# Patient Record
Sex: Female | Born: 1940 | Race: White | Hispanic: No | Smoking: Never smoker
Health system: Southern US, Community
[De-identification: ages and names within clinical notes are randomized; demographics above are authoritative.]

## PROBLEM LIST (undated history)

## (undated) ENCOUNTER — Emergency Department (HOSPITAL_BASED_OUTPATIENT_CLINIC_OR_DEPARTMENT_OTHER): Admission: EM | Payer: Self-pay | Source: Home / Self Care

## (undated) DIAGNOSIS — F32A Depression, unspecified: Secondary | ICD-10-CM

## (undated) DIAGNOSIS — H409 Unspecified glaucoma: Secondary | ICD-10-CM

## (undated) DIAGNOSIS — S42402A Unspecified fracture of lower end of left humerus, initial encounter for closed fracture: Secondary | ICD-10-CM

## (undated) DIAGNOSIS — Z5189 Encounter for other specified aftercare: Secondary | ICD-10-CM

## (undated) DIAGNOSIS — G459 Transient cerebral ischemic attack, unspecified: Secondary | ICD-10-CM

## (undated) DIAGNOSIS — R7611 Nonspecific reaction to tuberculin skin test without active tuberculosis: Secondary | ICD-10-CM

## (undated) DIAGNOSIS — M549 Dorsalgia, unspecified: Secondary | ICD-10-CM

## (undated) DIAGNOSIS — G4700 Insomnia, unspecified: Secondary | ICD-10-CM

## (undated) DIAGNOSIS — M5416 Radiculopathy, lumbar region: Secondary | ICD-10-CM

## (undated) DIAGNOSIS — G43909 Migraine, unspecified, not intractable, without status migrainosus: Secondary | ICD-10-CM

## (undated) DIAGNOSIS — G8929 Other chronic pain: Secondary | ICD-10-CM

## (undated) DIAGNOSIS — L0390 Cellulitis, unspecified: Secondary | ICD-10-CM

## (undated) DIAGNOSIS — M1990 Unspecified osteoarthritis, unspecified site: Secondary | ICD-10-CM

## (undated) DIAGNOSIS — G629 Polyneuropathy, unspecified: Secondary | ICD-10-CM

## (undated) DIAGNOSIS — F329 Major depressive disorder, single episode, unspecified: Secondary | ICD-10-CM

## (undated) DIAGNOSIS — I10 Essential (primary) hypertension: Secondary | ICD-10-CM

## (undated) DIAGNOSIS — F419 Anxiety disorder, unspecified: Secondary | ICD-10-CM

## (undated) DIAGNOSIS — G2581 Restless legs syndrome: Secondary | ICD-10-CM

## (undated) DIAGNOSIS — H269 Unspecified cataract: Secondary | ICD-10-CM

## (undated) DIAGNOSIS — E785 Hyperlipidemia, unspecified: Secondary | ICD-10-CM

## (undated) DIAGNOSIS — K219 Gastro-esophageal reflux disease without esophagitis: Secondary | ICD-10-CM

## (undated) DIAGNOSIS — R42 Dizziness and giddiness: Secondary | ICD-10-CM

## (undated) DIAGNOSIS — B019 Varicella without complication: Secondary | ICD-10-CM

## (undated) HISTORY — DX: Polyneuropathy, unspecified: G62.9

## (undated) HISTORY — PX: BACK SURGERY: SHX140

## (undated) HISTORY — DX: Unspecified glaucoma: H40.9

## (undated) HISTORY — DX: Varicella without complication: B01.9

## (undated) HISTORY — PX: APPENDECTOMY: SHX54

## (undated) HISTORY — PX: JOINT REPLACEMENT: SHX530

## (undated) HISTORY — DX: Hyperlipidemia, unspecified: E78.5

## (undated) HISTORY — DX: Encounter for other specified aftercare: Z51.89

## (undated) HISTORY — PX: OTHER SURGICAL HISTORY: SHX169

## (undated) HISTORY — DX: Migraine, unspecified, not intractable, without status migrainosus: G43.909

## (undated) HISTORY — DX: Unspecified fracture of lower end of left humerus, initial encounter for closed fracture: S42.402A

## (undated) HISTORY — PX: SHOULDER ARTHROSCOPY: SHX128

## (undated) HISTORY — PX: ESOPHAGOGASTRODUODENOSCOPY: SHX1529

---

## 1973-06-04 HISTORY — PX: ABDOMINAL HYSTERECTOMY: SHX81

## 1985-06-04 DIAGNOSIS — R7611 Nonspecific reaction to tuberculin skin test without active tuberculosis: Secondary | ICD-10-CM

## 1985-06-04 HISTORY — DX: Nonspecific reaction to tuberculin skin test without active tuberculosis: R76.11

## 1991-06-05 HISTORY — PX: CHOLECYSTECTOMY: SHX55

## 1997-12-10 ENCOUNTER — Ambulatory Visit (HOSPITAL_COMMUNITY): Admission: RE | Admit: 1997-12-10 | Discharge: 1997-12-10 | Payer: Self-pay | Admitting: Geriatric Medicine

## 1998-11-16 ENCOUNTER — Ambulatory Visit (HOSPITAL_COMMUNITY): Admission: RE | Admit: 1998-11-16 | Discharge: 1998-11-16 | Payer: Self-pay | Admitting: *Deleted

## 1998-12-15 ENCOUNTER — Ambulatory Visit (HOSPITAL_COMMUNITY): Admission: RE | Admit: 1998-12-15 | Discharge: 1998-12-15 | Payer: Self-pay | Admitting: *Deleted

## 1998-12-29 ENCOUNTER — Ambulatory Visit (HOSPITAL_COMMUNITY): Admission: RE | Admit: 1998-12-29 | Discharge: 1998-12-29 | Payer: Self-pay | Admitting: *Deleted

## 1999-01-12 ENCOUNTER — Ambulatory Visit (HOSPITAL_COMMUNITY): Admission: RE | Admit: 1999-01-12 | Discharge: 1999-01-12 | Payer: Self-pay | Admitting: *Deleted

## 1999-04-07 ENCOUNTER — Ambulatory Visit (HOSPITAL_COMMUNITY): Admission: RE | Admit: 1999-04-07 | Discharge: 1999-04-07 | Payer: Self-pay | Admitting: Orthopedic Surgery

## 1999-04-07 ENCOUNTER — Encounter: Payer: Self-pay | Admitting: Orthopedic Surgery

## 1999-05-14 ENCOUNTER — Ambulatory Visit (HOSPITAL_COMMUNITY): Admission: RE | Admit: 1999-05-14 | Discharge: 1999-05-14 | Payer: Self-pay | Admitting: *Deleted

## 1999-05-15 ENCOUNTER — Inpatient Hospital Stay (HOSPITAL_COMMUNITY): Admission: EM | Admit: 1999-05-15 | Discharge: 1999-05-17 | Payer: Self-pay | Admitting: Emergency Medicine

## 1999-05-15 ENCOUNTER — Encounter: Payer: Self-pay | Admitting: Geriatric Medicine

## 1999-06-05 HISTORY — PX: OTHER SURGICAL HISTORY: SHX169

## 1999-06-06 ENCOUNTER — Encounter: Admission: RE | Admit: 1999-06-06 | Discharge: 1999-06-27 | Payer: Self-pay | Admitting: Orthopedic Surgery

## 1999-08-15 ENCOUNTER — Encounter: Payer: Self-pay | Admitting: Geriatric Medicine

## 1999-08-15 ENCOUNTER — Encounter: Admission: RE | Admit: 1999-08-15 | Discharge: 1999-08-15 | Payer: Self-pay | Admitting: Geriatric Medicine

## 1999-12-20 ENCOUNTER — Ambulatory Visit (HOSPITAL_COMMUNITY): Admission: RE | Admit: 1999-12-20 | Discharge: 1999-12-20 | Payer: Self-pay | Admitting: Gastroenterology

## 2000-02-02 ENCOUNTER — Encounter: Payer: Self-pay | Admitting: Orthopedic Surgery

## 2000-02-07 ENCOUNTER — Inpatient Hospital Stay (HOSPITAL_COMMUNITY): Admission: RE | Admit: 2000-02-07 | Discharge: 2000-02-12 | Payer: Self-pay | Admitting: Orthopedic Surgery

## 2000-08-09 ENCOUNTER — Encounter: Admission: RE | Admit: 2000-08-09 | Discharge: 2000-08-09 | Payer: Self-pay | Admitting: Orthopedic Surgery

## 2000-08-09 ENCOUNTER — Encounter: Payer: Self-pay | Admitting: Orthopedic Surgery

## 2000-08-15 ENCOUNTER — Encounter: Admission: RE | Admit: 2000-08-15 | Discharge: 2000-08-15 | Payer: Self-pay | Admitting: Geriatric Medicine

## 2000-08-15 ENCOUNTER — Encounter: Payer: Self-pay | Admitting: Geriatric Medicine

## 2001-03-19 ENCOUNTER — Observation Stay (HOSPITAL_COMMUNITY): Admission: EM | Admit: 2001-03-19 | Discharge: 2001-03-19 | Payer: Self-pay | Admitting: *Deleted

## 2001-03-19 ENCOUNTER — Encounter: Payer: Self-pay | Admitting: Internal Medicine

## 2001-06-17 ENCOUNTER — Encounter: Payer: Self-pay | Admitting: Geriatric Medicine

## 2001-06-17 ENCOUNTER — Encounter: Admission: RE | Admit: 2001-06-17 | Discharge: 2001-06-17 | Payer: Self-pay | Admitting: Geriatric Medicine

## 2001-07-24 ENCOUNTER — Encounter: Payer: Self-pay | Admitting: Neurological Surgery

## 2001-07-28 ENCOUNTER — Observation Stay (HOSPITAL_COMMUNITY): Admission: RE | Admit: 2001-07-28 | Discharge: 2001-07-29 | Payer: Self-pay | Admitting: Neurological Surgery

## 2001-07-28 ENCOUNTER — Encounter: Payer: Self-pay | Admitting: Neurological Surgery

## 2001-08-19 ENCOUNTER — Encounter: Payer: Self-pay | Admitting: Geriatric Medicine

## 2001-08-19 ENCOUNTER — Encounter: Admission: RE | Admit: 2001-08-19 | Discharge: 2001-08-19 | Payer: Self-pay | Admitting: Geriatric Medicine

## 2001-10-13 ENCOUNTER — Encounter: Admission: RE | Admit: 2001-10-13 | Discharge: 2001-10-13 | Payer: Self-pay | Admitting: Neurological Surgery

## 2001-10-13 ENCOUNTER — Encounter: Payer: Self-pay | Admitting: Neurological Surgery

## 2001-11-07 ENCOUNTER — Encounter: Admission: RE | Admit: 2001-11-07 | Discharge: 2001-11-07 | Payer: Self-pay | Admitting: Neurological Surgery

## 2001-11-07 ENCOUNTER — Encounter: Payer: Self-pay | Admitting: Neurological Surgery

## 2001-11-20 ENCOUNTER — Encounter: Payer: Self-pay | Admitting: Neurological Surgery

## 2001-11-20 ENCOUNTER — Encounter: Admission: RE | Admit: 2001-11-20 | Discharge: 2001-11-20 | Payer: Self-pay | Admitting: Neurological Surgery

## 2002-08-21 ENCOUNTER — Encounter: Payer: Self-pay | Admitting: Geriatric Medicine

## 2002-08-21 ENCOUNTER — Encounter: Admission: RE | Admit: 2002-08-21 | Discharge: 2002-08-21 | Payer: Self-pay | Admitting: Geriatric Medicine

## 2003-04-20 ENCOUNTER — Inpatient Hospital Stay (HOSPITAL_COMMUNITY): Admission: RE | Admit: 2003-04-20 | Discharge: 2003-04-22 | Payer: Self-pay | Admitting: Neurological Surgery

## 2003-10-22 ENCOUNTER — Encounter: Admission: RE | Admit: 2003-10-22 | Discharge: 2003-10-22 | Payer: Self-pay | Admitting: Geriatric Medicine

## 2005-01-26 ENCOUNTER — Encounter: Admission: RE | Admit: 2005-01-26 | Discharge: 2005-01-26 | Payer: Self-pay | Admitting: Neurological Surgery

## 2005-02-09 ENCOUNTER — Encounter: Admission: RE | Admit: 2005-02-09 | Discharge: 2005-02-09 | Payer: Self-pay | Admitting: Neurological Surgery

## 2005-02-19 ENCOUNTER — Encounter: Admission: RE | Admit: 2005-02-19 | Discharge: 2005-02-19 | Payer: Self-pay | Admitting: Geriatric Medicine

## 2005-02-23 ENCOUNTER — Encounter: Admission: RE | Admit: 2005-02-23 | Discharge: 2005-02-23 | Payer: Self-pay | Admitting: Neurological Surgery

## 2005-06-13 ENCOUNTER — Ambulatory Visit (HOSPITAL_COMMUNITY): Admission: RE | Admit: 2005-06-13 | Discharge: 2005-06-13 | Payer: Self-pay | Admitting: Neurological Surgery

## 2005-10-04 ENCOUNTER — Encounter: Admission: RE | Admit: 2005-10-04 | Discharge: 2005-10-04 | Payer: Self-pay | Admitting: Internal Medicine

## 2005-12-13 ENCOUNTER — Inpatient Hospital Stay (HOSPITAL_COMMUNITY): Admission: EM | Admit: 2005-12-13 | Discharge: 2005-12-14 | Payer: Self-pay | Admitting: Emergency Medicine

## 2006-08-18 ENCOUNTER — Emergency Department (HOSPITAL_COMMUNITY): Admission: EM | Admit: 2006-08-18 | Discharge: 2006-08-18 | Payer: Self-pay | Admitting: Emergency Medicine

## 2007-01-26 ENCOUNTER — Encounter: Admission: RE | Admit: 2007-01-26 | Discharge: 2007-01-26 | Payer: Self-pay | Admitting: Neurological Surgery

## 2007-02-26 ENCOUNTER — Encounter: Admission: RE | Admit: 2007-02-26 | Discharge: 2007-02-26 | Payer: Self-pay | Admitting: Neurological Surgery

## 2007-03-13 ENCOUNTER — Inpatient Hospital Stay: Payer: Self-pay | Admitting: Internal Medicine

## 2007-03-13 ENCOUNTER — Other Ambulatory Visit: Payer: Self-pay

## 2007-03-28 ENCOUNTER — Encounter: Admission: RE | Admit: 2007-03-28 | Discharge: 2007-03-28 | Payer: Self-pay | Admitting: Neurological Surgery

## 2007-08-07 ENCOUNTER — Ambulatory Visit: Payer: Self-pay | Admitting: Psychiatry

## 2007-08-07 ENCOUNTER — Observation Stay (HOSPITAL_COMMUNITY): Admission: EM | Admit: 2007-08-07 | Discharge: 2007-08-07 | Payer: Self-pay | Admitting: Emergency Medicine

## 2007-09-30 ENCOUNTER — Observation Stay (HOSPITAL_COMMUNITY): Admission: EM | Admit: 2007-09-30 | Discharge: 2007-10-02 | Payer: Self-pay | Admitting: Emergency Medicine

## 2008-01-01 ENCOUNTER — Encounter: Admission: RE | Admit: 2008-01-01 | Discharge: 2008-01-01 | Payer: Self-pay | Admitting: Geriatric Medicine

## 2008-03-04 ENCOUNTER — Ambulatory Visit: Payer: Self-pay | Admitting: *Deleted

## 2008-03-04 ENCOUNTER — Emergency Department (HOSPITAL_COMMUNITY): Admission: EM | Admit: 2008-03-04 | Discharge: 2008-03-04 | Payer: Self-pay | Admitting: Emergency Medicine

## 2008-03-04 ENCOUNTER — Inpatient Hospital Stay (HOSPITAL_COMMUNITY): Admission: AD | Admit: 2008-03-04 | Discharge: 2008-03-09 | Payer: Self-pay | Admitting: *Deleted

## 2008-03-10 ENCOUNTER — Other Ambulatory Visit (HOSPITAL_COMMUNITY): Admission: RE | Admit: 2008-03-10 | Discharge: 2008-03-15 | Payer: Self-pay | Admitting: Psychiatry

## 2009-05-03 ENCOUNTER — Encounter: Admission: RE | Admit: 2009-05-03 | Discharge: 2009-05-03 | Payer: Self-pay | Admitting: Neurological Surgery

## 2009-05-05 ENCOUNTER — Encounter: Admission: RE | Admit: 2009-05-05 | Discharge: 2009-05-05 | Payer: Self-pay | Admitting: Neurological Surgery

## 2009-06-04 HISTORY — PX: OTHER SURGICAL HISTORY: SHX169

## 2009-06-04 HISTORY — PX: HEMATOMA EVACUATION: SHX5118

## 2009-06-09 ENCOUNTER — Inpatient Hospital Stay (HOSPITAL_COMMUNITY): Admission: RE | Admit: 2009-06-09 | Discharge: 2009-06-10 | Payer: Self-pay | Admitting: Neurological Surgery

## 2009-11-05 ENCOUNTER — Inpatient Hospital Stay (HOSPITAL_COMMUNITY): Admission: EM | Admit: 2009-11-05 | Discharge: 2009-11-07 | Payer: Self-pay | Admitting: Internal Medicine

## 2009-11-05 ENCOUNTER — Ambulatory Visit: Payer: Self-pay | Admitting: Diagnostic Radiology

## 2009-11-05 ENCOUNTER — Encounter: Payer: Self-pay | Admitting: Emergency Medicine

## 2009-11-06 ENCOUNTER — Ambulatory Visit: Payer: Self-pay | Admitting: Vascular Surgery

## 2009-11-06 ENCOUNTER — Encounter (INDEPENDENT_AMBULATORY_CARE_PROVIDER_SITE_OTHER): Payer: Self-pay | Admitting: Internal Medicine

## 2009-11-14 ENCOUNTER — Ambulatory Visit: Payer: Self-pay | Admitting: Surgery

## 2009-11-22 ENCOUNTER — Ambulatory Visit: Payer: Self-pay | Admitting: Diagnostic Radiology

## 2009-11-23 ENCOUNTER — Inpatient Hospital Stay (HOSPITAL_COMMUNITY): Admission: RE | Admit: 2009-11-23 | Discharge: 2009-11-25 | Payer: Self-pay | Admitting: Surgery

## 2009-11-23 ENCOUNTER — Encounter: Payer: Self-pay | Admitting: Surgery

## 2009-12-01 ENCOUNTER — Inpatient Hospital Stay (HOSPITAL_COMMUNITY): Admission: EM | Admit: 2009-12-01 | Discharge: 2009-12-04 | Payer: Self-pay | Admitting: Emergency Medicine

## 2009-12-01 ENCOUNTER — Ambulatory Visit: Payer: Self-pay | Admitting: Vascular Surgery

## 2009-12-01 ENCOUNTER — Encounter: Payer: Self-pay | Admitting: Emergency Medicine

## 2009-12-01 ENCOUNTER — Ambulatory Visit: Payer: Self-pay | Admitting: Diagnostic Radiology

## 2010-05-11 ENCOUNTER — Emergency Department (HOSPITAL_BASED_OUTPATIENT_CLINIC_OR_DEPARTMENT_OTHER): Admission: EM | Admit: 2010-05-11 | Discharge: 2009-11-22 | Payer: Self-pay | Admitting: Emergency Medicine

## 2010-06-19 ENCOUNTER — Ambulatory Visit: Admit: 2010-06-19 | Payer: Self-pay | Admitting: Surgery

## 2010-06-24 ENCOUNTER — Encounter: Payer: Self-pay | Admitting: Geriatric Medicine

## 2010-06-25 ENCOUNTER — Encounter: Payer: Self-pay | Admitting: Geriatric Medicine

## 2010-06-25 ENCOUNTER — Encounter: Payer: Self-pay | Admitting: Orthopedic Surgery

## 2010-06-26 ENCOUNTER — Encounter: Payer: Self-pay | Admitting: Geriatric Medicine

## 2010-08-20 LAB — BASIC METABOLIC PANEL
CO2: 29 mEq/L (ref 19–32)
Calcium: 9.3 mg/dL (ref 8.4–10.5)
Chloride: 102 mEq/L (ref 96–112)
GFR calc Af Amer: >60 mL/min (ref >60)
GFR calc non Af Amer: >60 mL/min (ref >60)
GFR calc non Af Amer: >60 mL/min (ref >60)
Glucose, Bld: 148 mg/dL — ABNORMAL HIGH (ref 70–99)
Potassium: 3.7 mEq/L (ref 3.5–5.1)
Potassium: 3.7 mEq/L (ref 3.5–5.1)
Sodium: 140 mEq/L (ref 135–145)
Sodium: 143 mEq/L (ref 135–145)

## 2010-08-20 LAB — CBC
HCT: 39.9 % (ref 36.0–46.0)
HCT: 37.1 % (ref 36.0–46.0)
Hemoglobin: 13.6 g/dL (ref 12.0–15.0)
Hemoglobin: 12 g/dL (ref 12.0–15.0)
MCHC: 32.3 g/dL (ref 30.0–36.0)
MCV: 101.4 fL — ABNORMAL HIGH (ref 78.0–100.0)
RBC: 4.1 MIL/uL (ref 3.87–5.11)
RDW: 14.9 % (ref 11.5–15.5)
WBC: 9.8 10*3/uL (ref 4.0–10.5)

## 2010-08-20 LAB — DIFFERENTIAL
Basophils Relative: 1 % (ref 0–1)
Eosinophils Relative: 5 % (ref 0–5)
Lymphocytes Relative: 44 % (ref 12–46)
Monocytes Absolute: 0.6 10*3/uL (ref 0.1–1.0)
Monocytes Relative: 8 % (ref 3–12)
Neutro Abs: 3.4 10*3/uL (ref 1.7–7.7)

## 2010-08-21 LAB — URINE MICROSCOPIC-ADD ON

## 2010-08-21 LAB — PROTIME-INR
INR: 1.03 (ref 0.00–1.49)
Prothrombin Time: 13.4 seconds (ref 11.6–15.2)

## 2010-08-21 LAB — URINALYSIS, ROUTINE W REFLEX MICROSCOPIC
Bilirubin Urine: NEGATIVE
Bilirubin Urine: NEGATIVE
Glucose, UA: NEGATIVE mg/dL
Hgb urine dipstick: NEGATIVE
Hgb urine dipstick: NEGATIVE
Protein, ur: NEGATIVE mg/dL
Specific Gravity, Urine: 1.029 (ref 1.005–1.030)
Urobilinogen, UA: 0.2 mg/dL (ref 0.0–1.0)
Urobilinogen, UA: 1 mg/dL (ref 0.0–1.0)
pH: 6 (ref 5.0–8.0)

## 2010-08-21 LAB — WOUND CULTURE

## 2010-08-21 LAB — COMPREHENSIVE METABOLIC PANEL
ALT: 3 U/L (ref 0–35)
AST: 18 U/L (ref 0–37)
Alkaline Phosphatase: 124 U/L — ABNORMAL HIGH (ref 39–117)
CO2: 24 mEq/L (ref 19–32)
CO2: 28 mEq/L (ref 19–32)
Calcium: 10.3 mg/dL (ref 8.4–10.5)
Chloride: 102 mEq/L (ref 96–112)
Creatinine, Ser: 0.7 mg/dL (ref 0.4–1.2)
Creatinine, Ser: 0.96 mg/dL (ref 0.4–1.2)
GFR calc Af Amer: >60 mL/min (ref >60)
GFR calc non Af Amer: 58 mL/min — ABNORMAL LOW (ref >60)
GFR calc non Af Amer: >60 mL/min (ref >60)
Glucose, Bld: 155 mg/dL — ABNORMAL HIGH (ref 70–99)
Total Bilirubin: 0.8 mg/dL (ref 0.3–1.2)

## 2010-08-21 LAB — BASIC METABOLIC PANEL
BUN: 13 mg/dL (ref 6–23)
BUN: 14 mg/dL (ref 6–23)
CO2: 28 mEq/L (ref 19–32)
CO2: 29 mEq/L (ref 19–32)
Calcium: 9.1 mg/dL (ref 8.4–10.5)
Chloride: 107 mEq/L (ref 96–112)
Chloride: 110 mEq/L (ref 96–112)
Creatinine, Ser: 0.59 mg/dL (ref 0.4–1.2)
GFR calc Af Amer: >60 mL/min (ref >60)
GFR calc Af Amer: >60 mL/min (ref >60)
GFR calc non Af Amer: 59 mL/min — ABNORMAL LOW (ref >60)
GFR calc non Af Amer: >60 mL/min (ref >60)
GFR calc non Af Amer: >60 mL/min (ref >60)
Glucose, Bld: 120 mg/dL — ABNORMAL HIGH (ref 70–99)
Glucose, Bld: 144 mg/dL — ABNORMAL HIGH (ref 70–99)
Glucose, Bld: 170 mg/dL — ABNORMAL HIGH (ref 70–99)
Potassium: 3.3 mEq/L — ABNORMAL LOW (ref 3.5–5.1)
Potassium: 3.4 mEq/L — ABNORMAL LOW (ref 3.5–5.1)
Potassium: 3.6 mEq/L (ref 3.5–5.1)
Sodium: 137 mEq/L (ref 135–145)
Sodium: 140 mEq/L (ref 135–145)
Sodium: 141 mEq/L (ref 135–145)
Sodium: 142 mEq/L (ref 135–145)

## 2010-08-21 LAB — CARDIAC PANEL(CRET KIN+CKTOT+MB+TROPI)
CK, MB: 1.2 ng/mL (ref 0.3–4.0)
CK, MB: 1.3 ng/mL (ref 0.3–4.0)
Relative Index: 1.2 (ref 0.0–2.5)
Relative Index: INVALID (ref 0.0–2.5)
Relative Index: INVALID (ref 0.0–2.5)
Total CK: 63 U/L (ref 7–177)
Troponin I: <0.01 ng/mL (ref 0.00–0.06)

## 2010-08-21 LAB — DIFFERENTIAL
Basophils Absolute: 0 10*3/uL (ref 0.0–0.1)
Basophils Absolute: 0.1 10*3/uL (ref 0.0–0.1)
Basophils Relative: 1 % (ref 0–1)
Basophils Relative: 2 % — ABNORMAL HIGH (ref 0–1)
Eosinophils Absolute: 0.1 10*3/uL (ref 0.0–0.7)
Eosinophils Absolute: 0.4 10*3/uL (ref 0.0–0.7)
Eosinophils Relative: 1 % (ref 0–5)
Eosinophils Relative: 4 % (ref 0–5)
Lymphocytes Relative: 31 % (ref 12–46)
Monocytes Absolute: 0.8 10*3/uL (ref 0.1–1.0)
Monocytes Relative: 9 % (ref 3–12)

## 2010-08-21 LAB — CBC
HCT: 34.8 % — ABNORMAL LOW (ref 36.0–46.0)
HCT: 36.8 % (ref 36.0–46.0)
HCT: 42.2 % (ref 36.0–46.0)
Hemoglobin: 11.6 g/dL — ABNORMAL LOW (ref 12.0–15.0)
Hemoglobin: 12.4 g/dL (ref 12.0–15.0)
Hemoglobin: 15.4 g/dL — ABNORMAL HIGH (ref 12.0–15.0)
MCH: 34.2 pg — ABNORMAL HIGH (ref 26.0–34.0)
MCHC: 33.2 g/dL (ref 30.0–36.0)
MCHC: 33.7 g/dL (ref 30.0–36.0)
MCHC: 33.8 g/dL (ref 30.0–36.0)
MCV: 100.4 fL — ABNORMAL HIGH (ref 78.0–100.0)
MCV: 101 fL — ABNORMAL HIGH (ref 78.0–100.0)
MCV: 101.6 fL — ABNORMAL HIGH (ref 78.0–100.0)
MCV: 99.3 fL (ref 78.0–100.0)
Platelets: 223 10*3/uL (ref 150–400)
RBC: 4.24 MIL/uL (ref 3.87–5.11)
RBC: 4.55 MIL/uL (ref 3.87–5.11)
RDW: 15.4 % (ref 11.5–15.5)
RDW: 17.7 % — ABNORMAL HIGH (ref 11.5–15.5)
WBC: 6 10*3/uL (ref 4.0–10.5)
WBC: 6.1 10*3/uL (ref 4.0–10.5)
WBC: 9.3 10*3/uL (ref 4.0–10.5)

## 2010-08-21 LAB — POCT CARDIAC MARKERS

## 2010-08-21 LAB — APTT: aPTT: 27 seconds (ref 24–37)

## 2010-08-21 LAB — LIPID PANEL
Cholesterol: 150 mg/dL (ref 0–200)
LDL Cholesterol: 71 mg/dL (ref 0–99)
VLDL: 36 mg/dL (ref 0–40)

## 2010-08-21 LAB — ANAEROBIC CULTURE

## 2010-08-21 LAB — URINALYSIS, MICROSCOPIC ONLY
Bilirubin Urine: NEGATIVE
Glucose, UA: NEGATIVE mg/dL
Ketones, ur: NEGATIVE mg/dL
pH: 5.5 (ref 5.0–8.0)

## 2010-08-21 LAB — URINE CULTURE

## 2010-08-21 LAB — ABO/RH: ABO/RH(D): O POS

## 2010-08-21 LAB — TYPE AND SCREEN: ABO/RH(D): O POS

## 2010-08-21 LAB — D-DIMER, QUANTITATIVE: D-Dimer, Quant: 0.33 ug/mL-FEU (ref 0.00–0.48)

## 2010-09-12 ENCOUNTER — Other Ambulatory Visit: Payer: Self-pay | Admitting: Geriatric Medicine

## 2010-09-14 ENCOUNTER — Ambulatory Visit
Admission: RE | Admit: 2010-09-14 | Discharge: 2010-09-14 | Disposition: A | Discharge status: Home / Self Care | Payer: Medicare Other | Source: Ambulatory Visit | Attending: Geriatric Medicine | Admitting: Geriatric Medicine

## 2010-09-25 ENCOUNTER — Other Ambulatory Visit: Payer: Self-pay | Admitting: Geriatric Medicine

## 2010-09-25 DIAGNOSIS — Z1231 Encounter for screening mammogram for malignant neoplasm of breast: Secondary | ICD-10-CM

## 2010-10-05 ENCOUNTER — Ambulatory Visit: Payer: Medicare Other

## 2010-10-17 NOTE — H&P (Signed)
NAMEMADDUX, FIRST NO.:  [id number]   MEDICAL RECORD NO.:  [id number]          PATIENT TYPE:  INP   LOCATION:  0102                         FACILITY:  [facility]   PHYSICIAN:  Ramiro Harvest, MD    DATE OF BIRTH:  01-02-1941   DATE OF ADMISSION:  08/07/2007  DATE OF DISCHARGE:                              HISTORY & PHYSICAL   PRIMARY CARE PHYSICIAN:  Dr. Merlene Laughter.   HISTORY OF PRESENT ILLNESS:  [name] is a 70 year old white female  with a history of depression, hypertension and hyperlipidemia who  presents to the ED with benzo overdose. Per patient, she had been  feeling worn out, felt tired and depressed over the past several weeks  due to inability to get her husband referred to an  oncologist/hematologist for further workup of his anemia and inability  to find out the reason for his decrease in hemoglobin.  The patient  stated that she felt that she would rather go before she sees him die  and as such the patient took approximately 32-45 mg valium pills  belonging to her husband prior to going to bed. The patient then called  his son to say good-bye and told her husband what she had done. Her  husband called EMS who brought the patient to the hospital. The patient  denies any chest pain or shortness of breath, no palpitations, no  nausea, no vomiting, no other associated symptoms.  The patient also  wrote a suicide note which I have included in the chart. The  patient  endorses anhedonia, insomnia, guilt, decrease energy, decreased  concentration and a depressed mood over the past several weeks. The  patient is tearful during the interview but answering questions  appropriately. We will call to admit the patient for observation and  further evaluation.   ALLERGIES:  SULFA causes nausea.   PAST MEDICAL HISTORY:  1. Hypertension.  2. Hyperlipidemia.  3. Vertigo.  4. Depression.  5. Restless leg syndrome.  6. Degenerative joint disease.  7.  History of peptic ulcer disease diagnosed in November 1988.  8. History of hiatal hernia.  9. Gastroesophageal reflux disease.  10  History of tachy palpitations with negative workup greater than 10  years ago.  1. History of lumbar spondylosis and stenosis with right lumbar      radiculopathy L4-L5, L5-S1.  2. Herniated nucleus pulposus on the right L5-S1.  3. Questionable history of Parkinson's disease.  The patient denies      ever having Parkinson's disease and states that Sinemet was for      restless leg syndrome however, it is included in each on most of      her H&Ps.   PAST SURGICAL HISTORY:  1. Bilateral laminectomies and foraminectomies of L4 to L5 on the      right, l5 to S1 laminectomy and diskectomy per Dr. Danielle Dess in on      November 2004.  2. Status post anterior diskectomy and arthrodesis C5 to C6, fracture      allograft, synthesis  fixation per Dr. Danielle Dess on February 2003.  3.  Status post left total knee arthroscopy per Dr. Sedonia Small in      September 2009.   HOME MEDICATIONS:  1. Lipitor 20 mg daily.  2. Sinemet 25/250 t.i.d. and Sinemet 50/250 q.h.s.  3. Fluoxetine 20 mg daily.  4. Norvasc 5 mg daily.  5. Darvocet-N 100 one tablet p.o. q.6 h p.r.n.  6. Aspirin 81 mg daily.  We will need to recheck on the patient's medication list from the  patient's PCP.   SOCIAL HISTORY:  The patient is a nurse, no tobacco, no alcohol use, no  IV drug use. She has two sons all of whom are healthy.   FAMILY HISTORY:  Mother deceased at age 62 from Alzheimer's.  Father  deceased at age 61 of causes unknown. Sister deceased age 43 from  congestive heart failure. Two brothers alive, one with type 2 diabetes.   REVIEW OF SYSTEMS:  Per HPI otherwise negative.   PHYSICAL EXAM:  Temperature 97.7, blood pressure 113/72, pulse of 75,  respiratory rate 19, satting 95% on room air.  GENERAL:  The patient on gurney, opens eyes to verbal stimuli, speaking  coherently answering  questions but tearful.  HEENT:  Normocephalic, atraumatic.  Pupils equal, round and reactive to  light.  Extraocular movements intact.  Oropharynx is clear, moist, no  lesions, no exudate.  NECK:  Supple.  No lymphadenopathy.  RESPIRATORY:  Lungs are clear to auscultation bilaterally.  No wheezes,  no rhonchi.  CARDIOVASCULAR:  Regular rate and rhythm.  No murmurs, rubs or gallops.  ABDOMEN:  Soft, nontender, nondistended.  Positive bowel sounds.  EXTREMITIES:  No clubbing, cyanosis or edema.  NEUROLOGIC:  The patient is alert and oriented x3.  Cranial nerves II-  XII are grossly intact.  No focal deficits.  PSYCH:  The patient is tearful and depressed. Her memory is intact.   LABORATORY DATA:  Salicylate level less than 4.0.  Acetaminophen level  14.4.  UDS positive for benzos. CMET, sodium 135, potassium 3.4,  chloride 100, bicarb 27, BUN 5, creatinine 0.60, glucose of 85, calcium  9.1, bilirubin 0.7, alk phosphatase 76, AST 12, ALT less than 8. Protein  6.9, albumin 3.6.  CBC, white count 5.2, hemoglobin 13.3, platelets 334,  hematocrit 39.4. UA is nitrite positive, moderate leukocyte esterase.  Micro is 3-6 white blood cells, bacteria many.   EKG with T-wave inversions in leads V4-V6. This is unchanged from prior  EKG.   ASSESSMENT AND PLAN:  [name] is a 70 year old female with a  history of depression, hypertension, and hyperlipidemia who presents to  the emergency room with her a benzo overdose and suicidal attempt.  1. Suicidal ideation/attempt secondary to depression.  The patient      also had a suicide note. The patient is however alert and oriented      following commands, but tearful and answering questions      appropriately. The patient took approximately 30-40 Valium tablets,      each 5 mg. The patient is not lethargic or drowsy. Acetaminophen      level and salicylate levels negative/normal. EKG is unchanged from      prior EKG. Will monitor the patient  on telemetry.  Will place the      patient on suicide precautions and 24-hour sitter. Will get a psych      consult for further evaluation and management.  2. Drug overdose secondary to problem #1 and depression.  The EKG is  unchanged. Lab work is unremarkable.  The patient alert, follows      commands, answers questions appropriately, however, tearful.      Monitor the patient on telemetry, IV fluid hydration, see problem      number #1.  3. Probable urinary tract infection. Check a urine culture.  Start the      patient on Cipro 250 mg p.o. b.i.d. x3 days while I wait for the      culture results.  4. Hypokalemia.  Replete.  5. Depression.  Continue home dose fluoxetine. Will need psychiatric      evaluation.  6. Hypertension.  Continue home dose Norvasc 5 mg daily.  7. Hyperlipidemia.  Lipitor 20 mg daily.  8. Restless leg syndrome. Sinemet per patient. Will confirm with PCP.  9. Questionable Parkinson disease.  Continue Sinemet.  10.Vertigo. Meclizine p.r.n.  11.Gastroesophageal reflux disease. Protonix.  12.Degenerative joint disease.  13.History of peptic ulcer disease.  14.Prophylaxis.  Protonix for GI prophylaxis.  SCDs for DVT      prophylaxis.   It was a pleasure taking care of [name].      Ramiro Harvest, MD  Electronically Signed     DT/MEDQ  D:  08/07/2007  T:  08/07/2007  Job:  [id number]   cc:   Hal T. Stoneking, M.D.  Fax: [phone]

## 2010-10-17 NOTE — Discharge Summary (Signed)
NAME:  Isabella, [name]               ACCOUNT NO.:  [id number]   MEDICAL RECORD NO.:  [id number]          PATIENT TYPE:  INP   LOCATION:  1510                         FACILITY:  [facility]   PHYSICIAN:  Corinna L. Lendell Caprice, MDDATE OF BIRTH:  [birth year]   DATE OF ADMISSION:  08/07/2007  DATE OF DISCHARGE:  08/07/2007                               DISCHARGE SUMMARY   DISCHARGE DIAGNOSES:  1. Valium overdose.  2. Hypertension.  3. Urinary tract infection.  4. Hyperlipidemia.  5. Restless legs leg syndrome.  6. Depression.  7. Gastroesophageal reflux disease.   DISCHARGE MEDICATIONS:  1. Cipro 250 mg p.o. b.i.d. until gone.  2. Continue Prozac, Lipitor, Sinemet, Norvasc, Darvocet and aspirin as      previous.   Follow up with Dr. Pete Glatter next week.   CONDITION:  Stable.   ACTIVITY:  Ad lib.   CONSULTATIONS:  Anselm Jungling, MD.   PROCEDURES:  None.   LABS:  Urine drug screen positive for benzodiazepine.  Salicylate level  negative.  Acetaminophen level 14.  Complete metabolic panel significant  for potassium of 3.4.  CBC unremarkable.  Urinalysis was cloudy,  positive nitrite, moderate leukocyte esterase, 3-6 white cells, many  bacteria.  Urine cultures pending.  Chest x-ray shows chronic bronchitic  changes and right basilar atelectasis.  EKG shows normal sinus rhythm,  low-voltage, inferolateral ST changes, unchanged from previous EKG.   HISTORY AND HOSPITAL COURSE:  [name] is a 70 year old white female  patient of Dr. Pete Glatter who came to the emergency room after having  overdosed on Valium.  Apparently it was 30-40 pills.  She has been very  anxious about her husband's health.  Apparently she called her son to  say good-bye and left a note.  She had normal vital signs.  She was  tearful, able to answer questions and follow commands.  She was placed  on telemetry for observation on suicide precautions.  Psychiatry was  consulted.  Dr. Barrett Shell dictated consult is  pending but I spoke to him  personally.  He reports that the patient felt overwhelmed and tired and  just wanted to go to sleep and she is no longer a suicide risk and is  able to contract for safety.  He feels that she does not need inpatient  or outpatient psychiatric follow-up but should continue her Prozac and  follow up with her primary care physician.  She remained in normal sinus  rhythm and at the time of discharge has normal vital signs, is feeling  better and stable for discharge.   She also was found to have a urinary tract infection and has been  started on ciprofloxacin.  Her other medical problems remained stable.      Corinna L. Lendell Caprice, MD  Electronically Signed     CLS/MEDQ  D:  08/07/2007  T:  08/08/2007  Job:  [id number]   cc:   Hal T. Stoneking, M.D.  Fax: [phone]

## 2010-10-17 NOTE — Assessment & Plan Note (Signed)
OFFICE VISIT   CLEVELAND, PAIZ  DOB:  1941-03-30                                       11/14/2009  ZOXWR#:[id number]   REASON FOR VISIT:  Right carotid stenosis.   HISTORY:  [name] is a very pleasant 70 year old female who was  admitted to the hospital on June 4 with an episode of syncope.  Full  workup was performed which did not determine the etiology of her  syncope.  She had an echo and multiple imaging studies.  She also had a  carotid Doppler study which revealed a 80% right carotid stenosis.  She  had been sent home and has not had any further episodes of.  She denies  numbness or weakness in her arms or her legs.  She denies slurring of  her speech, although when pressed she feels that occasionally she has  had episodes where her tongue gets stuck on top of her mouth and she has  trouble getting her words out.  She denies amaurosis fugax.   The patient suffers from hypertension and hyperlipidemia which are  medically managed.   REVIEW OF SYSTEMS:  GENERAL:  Positive for weight loss and loss of  appetite.  VASCULAR:  Positive for pain in feet when lying flat and slurred speech.  CARDIAC:  Positive for shortness breath with exertion, palpitations and  arrhythmia.  GI:  Negative.  NEURO:  Positive for black outs.  PULMONARY:  Negative.  HEME:  Negative.  GU is positive for UTI.  EENT:  Positive for recent change in eye sight.  MUSCULOSKELETAL:  Positive for arthritis, joint pain and muscle pain.  PSYCH:  Positive for depression and anxiety.  SKIN:  Negative.   PAST MEDICAL HISTORY:  Hypertension, hypercholesterolemia, and restless  leg syndrome.   PAST SURGICAL HISTORY:  Cholecystectomy, hysterectomy, 3 lumbar  laminectomies, 2 cervical surgeries and a left knee replacement.   FAMILY HISTORY:  Positive for her mother who died at age 17 with  Alzheimer's.  Her brother positive for diabetes.  A sister with  congestive heart failure.   SOCIAL HISTORY:  [name] widowed.  [name] a former Char[employer] fundraiser and does not smoke,  does not drink.   ACTIVITY:  Allergies are to penicillin which causes a rash and sulfa  which causes nausea and vomiting.   PHYSICAL EXAMINATION:  Heart rate 106, blood pressure 140/85, O2 sat  98%.  GENERAL:  [name] well-appearing, in no distress.  HEENT:  Within normal limits.  She does have a slight downward turn of  the corner of her left mouth.  LUNGS:  Clear bilaterally.  CARDIOVASCULAR:  Regular rate and rhythm.  No carotid bruits.  Palpable  pedal pulses.  ABDOMEN:  Soft, nontender.  No pulsatile mass.  MUSCULOSKELETAL:  Without major deformities.  NEURO:  Cranial nerves II-XII are grossly intact.  Slight depression of  the left corner of her mouth.  No focal deficits.  SKIN:  Without rash.   DIAGNOSTICS:  The patient comes with an ultrasound from [facility] which  shows right carotid stenosis greater than 80%.  The left stenosis was 40-  59%.   ASSESSMENT/PLAN:  Asymptomatic right carotid stenosis.   PLAN:  The patient has been scheduled for right carotid endarterectomy  to be performed Friday, June 24.  The risks and benefits were discussed  with the patient including risk of bleeding, risk of nerve injury, risk  of stroke and risk of cardiopulmonary complications.  All questions were  answered today.  I have reiterated this is not the etiology for her  syncopal episode and that this operation is being done for stroke  prevention.     Jorge Ny, MD  Electronically Signed   VWB/MEDQ  D:  11/14/2009  T:  11/15/2009  Job:  [id number]

## 2010-10-17 NOTE — H&P (Signed)
NAMELIZABETH, FELLNER NO.:  [id number]   MEDICAL RECORD NO.:  [id number]          PATIENT TYPE:  INP   LOCATION:  0500                          [facility]ACILITY:  [facility]   PHYSICIAN:  Jasmine Pang, M.D. DATE OF BIRTH:  May 12, 1941   DATE OF ADMISSION:  03/04/2008  DATE OF DISCHARGE:                       PSYCHIATRIC ADMISSION ASSESSMENT   Date of the assessment is March 05, 2008 at 9:15 a.m.   IDENTIFYING INFORMATION:  A 70 year old Caucasian female, married.  This  is a voluntary admission.   HISTORY OF PRESENT ILLNESS:  [facility] admission for this 70 year old  who took about 80 tablets of temazepam 30 mg over the course of a 24-  hour period.  She reports that she had a lot of fear that her 33-year-  old husband, who has cardiac problems, would die before she would.  She  said I wanted to make sure I went first.  She says she cannot picture  herself living without him.  They have been married for the past 7-1/2  years but knew each other earlier in life and they worked together at  the [employer].  More recently, he has had problems with unstable blood  pressure and has a history of heart problems and she was feeling very  fearful that he did not have long to live.  She denies abuse of alcohol  or drugs, is not currently under any psychiatric care.  She has  apparently had a similar overdose on Valium in March of this year,  although today she denied prior attempts.   PAST PSYCHIATRIC HISTORY:  No current outpatient psychiatric care.  She  has a history of prior admission in March 2009 for overdose on Valium.  She has a history of counseling about 20 years ago with [facility] Scrape, and  also saw Dr. Bradly Chris at [facility] 25 years ago.  She reports her care consisted primarily of counseling and she does not  remember any medications.  She has one prior hospitalization at [facility] about 20 years ago.   SOCIAL HISTORY:  Married 7-1/2 years to  her current husband.  She has 2  sons by a previous marriage.  Her husband is currently on the cardiac  unit at [facility] and was admitted at the same time she was,  for unstable blood pressure.  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  No legal problems.  She reports they  are financially stable.  With his marriage, she has acquired multiple  grandchildren and other supportive children.  She reports family support  is good and the marriage is good.   She denies a family history of mental illness or substance abuse.   ALCOHOL AND DRUG HISTORY:  She denies current or past history of  substance abuse.   MEDICAL HISTORY:  Primary care practitioner, Merlene Laughter, MD.  Neurosurgeon, Dr. Danielle Dess for cervical disk disease.   MEDICAL PROBLEMS:  1. Hypertension.  2.  Dyslipidemia.  3.  Restless leg syndrome.  4.      Degenerative disk disease.  PAST MEDICAL HISTORY:  Significant for:  1.  A cervical disk repair.  2.  Recent injection of her fifth lumbar disk.  3.  Cholecystectomy.  4.  Hysterectomy.   CURRENT MEDICATIONS:  1. Temazepam 30 mg p.o. nightly.  2.  Vicodin as needed for back pain.      3.  Norvasc 10 mg daily.  4.  Zocor 40 mg daily.  5.  Sinemet      20/200 p.o. 3 times a day and Sinemet 50/200 mg p.o. nightly.  6.      Aspirin 81 mg daily.  7.  Gabapentin 300 mg p.o. t.i.d.   DRUG ALLERGIES:  SULFA,which causes nausea.   Physical exam was done in the emergency room and is noted in the record.  Medium-build healthy female who has described some history of chronic  back pain due to lumbar disk disease.   DIAGNOSTIC STUDIES:  CBC:  WBC 6.5, hemoglobin 12.3, hematocrit 38,  platelets 284,000 and MCV 97.4.  Chemistry:  Sodium 141, potassium 4.3,  chloride 107, carbon dioxide 26, BUN 12, creatinine 0.67, random glucose  99.  Liver enzymes:  SGOT 12, SGPT 8, alkaline phosphatase 91 and total  bilirubin 0.7.  Urine drug screen is  positive for benzodiazepines,  negative for all other substances.   MENTAL STATUS EXAM:  [name] fully alert, oriented x4.  Does have a  rather dazed affect.  Recall is a little bit delayed, but she does give  a coherent history.  Gives accurate details.  Insight is limited.  Speech normal.  Mood neutral today.  She says she is relieved that she  has survived the overdose.  Does not want to die, wants to live to take  care of her grandchildren.  Seems a little bit perplexed herself on  exactly why she overdosed.  Cognition is intact, in terms of  orientation.  Immediate recall is delayed.  Recent and distant recall is  completely intact.  Motor and sensory are intact and gait is normal.  Proprioception is intact.  Impulse control within normal limits.   AXIS I:  Depressive disorder, not otherwise specified.  AXIS II:  Deferred.  AXIS III:  Status post benzodiazepine overdose.  Hypertension.  Dyslipidemia.  Degenerative disk disease.  Restless leg syndrome.  AXIS IV:  Moderate-to-severe caregiver stress.  AXIS V:  Current 48. past year 46.   PLAN:  To voluntarily admit her with every-15-minute checks in place, to  our grief and loss group.  We are going to discontinue the temazepam,  but will make available Librium 25 mg q.6 h p.r.n. for any withdrawal  symptoms.  Meanwhile, we are going to check a routine urinalysis on her.  We do note that she did have a urinary tract infection in March of this  year when she had her previous overdose.  She acknowledges significant  caregiver stress and is agreeable to the idea of returning to  counseling.  Meanwhile, we hope to get her family involved in care.      Margaret A. Lorin Picket, N.P.      Jasmine Pang, M.D.  Electronically Signed    MAS/MEDQ  D:  03/05/2008  T:  03/07/2008  Job:  [id number]

## 2010-10-17 NOTE — Consult Note (Signed)
NAME:  [name], [name] NO.:  [id number]   MEDICAL RECORD NO.:  [id number]          PATIENT TYPE:  INP   LOCATION:  1510                         [facility]   PHYSICIAN:  Anselm Jungling, MD  DATE OF BIRTH:  Aug 18, 1940   DATE OF CONSULTATION:  08/07/2007  DATE OF DISCHARGE:                                 CONSULTATION   IDENTIFYING DATA AND REASON FOR ADMISSION:  This is my first contact  with [name], a 70 year old married woman who is admitted in the  aftermath of a benzodiazepine overdose.  Psychiatric consultation is  requested to assess mental status and make recommendations.   HISTORY OF THE PRESENTING PROBLEM:  The patient took an overdose of  diazepam.  She has a history of depression, hypertension,  hyperlipidemia, restless leg syndrome, degenerative joint disease, and  gastroesophageal reflux disorder.  Her current medications include  Lipitor, Sinemet, Norvasc 5 mg, Darvocet, and aspirin.   For several years, she has been taking Prozac via her primary care  physician, Dr. Pete Glatter.  The Prozac has been for what the patient  describes as a seasonal depression.  She feels it has been helpful to  her.  Sometime in the past she decided to go off it, and her depression  came back.  When Prozac was reinstated, she felt better.  She has had no  other psychiatric history and no history of suicide attempt.   The patient cites very specific stressors pertaining to her husband and  his current health, which has been declining.  She has been trying to  get what she feels would be adequate medical evaluation for ongoing  problems he has had with anemia.  She states that she has met some  resistance from his doctors when it comes to the question of getting  additional consultations or specialists involved.  She felt worn out and  overwhelmed by this, and just prior to this admission took several of  his Valium, not in an attempt to end her life, but in an  attempt to be  able to rest and go to sleep.  She had been having great difficulty  sleeping.  She states that she has never done anything like this before,  and clearly states that she did not want to die.  She denies any  suicidal ideation currently.   MENTAL STATUS AND OBSERVATIONS:  The patient is a well-nourished, well-  developed adult female who is lying in bed, awake.  [name] fully alert,  fully oriented.  [name] pleasant and appears to be a good historian.  [name] quite open and frank about her current situation.  There is  nothing to suggest any underlying psychosis or thought disorder or  cognitive impairment.  Her mood appears to be mildly depressed with sad  affect.  She appears to be quite regretful of her overdose and gives  strong reassurances that she would never do this again.  She also gives  strong reassurances that this was not a suicide attempt and that she is  not feeling suicidal now.   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  AXIS II:  Deferred.  AXIS III:  History of hypertension, hyperlipidemia, restless leg,  degenerative joint disease, GERD.  AXIS IV:  Stressors severe.  AXIS V:  GAF 65.   RECOMMENDATIONS:  I discussed her situation at length with her.  Mrs.  [name] indicates that she does not feel that she needs any particular  further psychiatric care.  She plans to continue Prozac and accepted my  recommendation of increasing her dose to 40 mg daily.  She states that  she has a followup appointment with Dr. Pete Glatter next Tuesday.  She  declined an offer of a referral to an individual psychotherapist.  She  remarks that she and her husband communicate well about matters of  importance to them, and she does not feel a need for anyone to talk to  outside of the family.   I discussed my impressions with Dr. Lendell Caprice.  I feel it is  reasonable  to discontinue the sitter at this time and increase Prozac to 40 mg  daily.   Thank you for involving me in her care.  Please do not hesitate to  contact me if it would be helpful.      Anselm Jungling, MD  Electronically Signed     SPB/MEDQ  D:  08/07/2007  T:  08/07/2007  Job:  [id number]

## 2010-10-17 NOTE — Consult Note (Signed)
NAMEKRISTAN, [name] NO.:  [id number]   MEDICAL RECORD NO.:  [id number]          PATIENT TYPE:  OBV   LOCATION:  3037                         FACILITY:  [facility]   PHYSICIAN:  Stefani Dama, M.D.  DATE OF BIRTH:  1940/12/10   DATE OF CONSULTATION:  09/30/2007  DATE OF DISCHARGE:  10/02/2007                                 CONSULTATION   REQUESTING PHYSICIAN:  Lavonda Jumbo, M.D.   REASON FOR REQUEST:  Intractable back pain.   HISTORY OF PRESENT ILLNESS:  [name] is a 70 year old female  who is a known patient to our practice.  She contacted me earlier in the  morning on September 30, 2007, indicating that she has had intractable,  intolerable back pain.  I advised her that she come to the emergency  department for further evaluation.  After she arrived, they contacted me  and I have written orders for parenteral pain medication and obtaining  an MRI of the lumbar spine.  We have seen her in the emergency  department this morning.  She had her MRI scan and I evaluated this.  It  demonstrates that she has diffuse spondylitic changes at the L4-L5 and  L5-S1 level, but no evidence of any neural compression.   PHYSICAL EXAMINATION:  The patient is in severe pain.  She has a marked  amount of paravertebral spasm and turns very rigidly from one side to  the other with great difficulty.  Her motor strength appears intact in  the iliopsoas, quad, tibialis anterior, and gastroc.  Reflexes are  symmetric.   At this point, the patient will be given additional parenteral  medications, but she will require admission.  For further details of her  past medical history and other details of the physical exam, please  refer to that document.      Stefani Dama, M.D.  Electronically Signed     HJE/MEDQ  D:  10/06/2007  T:  10/07/2007  Job:  [id number]

## 2010-10-17 NOTE — H&P (Signed)
NAMEHAADIYA, FROGGE NO.:  [id number]   MEDICAL RECORD NO.:  [id number]          PATIENT TYPE:  OBV   LOCATION:  3037                         FACILITY:  [facility]   PHYSICIAN:  Stefani Dama, M.D.  DATE OF BIRTH:  Oct 25, 1940   DATE OF ADMISSION:  09/30/2007  DATE OF DISCHARGE:                              HISTORY & PHYSICAL   ADMISSION DIAGNOSES:  Lumbar spondylosis, intractable back pain, and  lumbar radiculopathy.   HISTORY OF PRESENT ILLNESS:  [name] is a 70 year old, individual,  who is known in our service, having had difficulties with lumbar  spondylosis, requiring an occasional epidural steroid injection.  She  has been managed conservatively, but notes that over the past couple of  weeks, the pain became so severely disabling that she could not sleep at  all this past night, and contacted our office about 4:00 a.m.  I advised  that she come to the emergency room for both pain control and ultimately  an MRI of the lumbar spine.  Most recently, the patient noted that she  had severe right lumbar radiculopathy than rather spontaneously, the  pain switched over to the left lower extremity.  She felt certain that  some degenerative changes in the disks had gotten worse.   Her past medical history is notable for some hypertension.  She has some  hyperlipidemia and has been on Zocor 20 mg a day and  Norvasc 5 mg a  day.  She takes aspirin 81 mg a day, Prozac 40 mg daily, and Sinemet  25/250 mg one three times a day for restless leg syndrome.  She has no  known allergies to any medications.  Her primary care physician is Dr.  Merlene Laughter.   SOCIAL HISTORY:  [name] married.  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   PHYSICAL EXAMINATION:  GENERAL:  [name] alert, oriented, and cooperative  individual.  VITAL SIGNS:  Blood pressure is 110/72, heart rate is 66 and regular,  and respirations are 18.  LUNGS:  Clear to  auscultation.  HEART:  Regular rate and rhythm.  ABDOMEN:  Soft.  Bowel sounds positive.  No masses are palpable.  EXTREMITIES:  No cyanosis, clubbing, or edema.  NEUROLOGIC:  Motor strength is good in the iliopsoas, quad, tibialis,  and anterior gastrocs.  Deep tendon reflexes are 1+ in the patellae,  trace in the Achilles.  Babinski's are downgoing.  Sensation appears  intact in distal lower extremities.   IMPRESSION:  The patient has evidence of spondylitic degeneration of  lower lumbar spine.  She has been having intractable back pain which  seems better after dose of morphine.  We will see what an MRI of her  lumbar spine looks like and make some appropriate adjustments in  medication, perhaps an epidural steroid may be beneficial for her.      Stefani Dama, M.D.  Electronically Signed    HJE/MEDQ  D:  09/30/2007  T:  10/01/2007  Job:  [id number]

## 2010-10-17 NOTE — Discharge Summary (Signed)
[name], [name] NO.:  [id number]   MEDICAL RECORD NO.:  [id number]          PATIENT TYPE:  OBV   LOCATION:  3037                         FACILITY:  [facility]   PHYSICIAN:  Stefani Dama, M.D.  DATE OF BIRTH:  10-Nov-1940   DATE OF ADMISSION:  09/30/2007  DATE OF DISCHARGE:  10/02/2007                               DISCHARGE SUMMARY   ADMITTING DIAGNOSES:  1. Intractable back pain.  2. Lumbar spondylosis.   DISCHARGE AND FINAL DIAGNOSES:  1. Intractable back pain.  2. Lumbar spondylosis.  3. Urinary tract infection   HOSPITAL COURSE:  [name] is a 70 year old individual who has had  significant spondylitic disease and lower lumbar spine.  She has had 2  previous laminectomies at the L5-S1 level.  She has a degenerating disk  at the L4-L5 level.  She also has mild degenerative changes at L2-L3 and  L3-L4.  On the morning of admission, that is 28th, the patient had such  severe pain.  She could not be controlled with oral analgesics at home.  She presented to the emergency department being brought there by her  husband.  An MRI was performed emergently which demonstrated multilevel  spondylitic degenerative changes but no evidence of an acute disk  herniation or an acute inflammatory process that could be identified  radiographically.  She was treated with a single epidural steroid  injection given by Dr. Alfredo Batty via the caudal route and this did seem to  help ease the pain.  The patient had been started on Toradol.  During  the hospitalization, she complained of significant frequency and some  burning of urination.  UA and CNS was sent off, however, culture result  is not back at the present time.  [name] being started on Cipro for  urinary tract infection.  The patient also had pain in the region of  left hip and left lower extremity and radiographs of the pelvis and  lumbar spine demonstrated that there is a grade 1 spondylolisthesis at  the L4-L5  level, which is not significantly mobile.  She has marked  collapse of the L5-S1 disk space where she has had previous surgery.  Beyond this, she has hip joints that appear to be normal in their  confirmation.  At the current time, her pain is well controlled with  oral analgesics.  She will be discharged to home with a prescription for  Cipro for urinary tract infection and Darvocet for pain control.  She  will be followed up in the office in 3 weeks time.   CONDITION ON DISCHARGE:  Stable.      Stefani Dama, M.D.  Electronically Signed     HJE/MEDQ  D:  10/02/2007  T:  10/03/2007  Job:  [id number]

## 2010-10-20 NOTE — Consult Note (Signed)
NAMEJENNILEE, [name] NO.:  [id number]   MEDICAL RECORD NO.:  [id number]          PATIENT TYPE:  INP   LOCATION:  3715                         FACILITY:  [facility]   PHYSICIAN:  [name], M.D.   DATE OF BIRTH:  1940-10-30   DATE OF CONSULTATION:  12/14/2005  DATE OF DISCHARGE:  12/14/2005                                   CONSULTATION   CONCLUSIONS:  1.  Chest tightness, causes uncertain.  Rule out cardiac, rule out GI, rule      out other.  2.  Hypertension.  3.  Hyperlipidemia.  4.  Recent recurrent vertigo.  5.  Parkinson's disease.  6.  Depression.  7.  Prior history of tachy palpitations with negative cardiac workup greater      than 10 years ago.   RECOMMENDATIONS:  1.  Serial cardiac markers.  Rule out MI.  2.  Check chest x-ray.  3.  Stress or adenosine Cardiolite if rules out; coronary angio if rule in.  4.  NPO after midnight.  5.  Aspirin.  6.  Agree with nitroglycerin patch for now.  7.  Check EKG.  Unable to find EKG available on chart at the time of initial      evaluation.   COMMENTS:  The patient is 14 and has a history of multiple medical problems,  including Parkinson's disease, hypertension, and depression.  [name]  admitted with spontaneous onset of substernal squeezing discomfort.  This  discomfort waxed and waned over approximately a 3- to 4-hour period.  There  was no nausea, vomiting, or diaphoresis.  The discomfort radiated through to  her back.  [name] currently pain free.  Sublingual nitroglycerine did not  help.  The patient currently has nitroglycerine ointment on.   MEDICATIONS:  1.  Caduet 5/20 mg per day.  2.  Sinemet 25/250 mg t.i.d.  3.  Fluoxetine 20 mg per day.   ALLERGIES:  SULFA.   FAMILY HISTORY:  Positive for CAD, with an older sister having undergone  coronary artery bypass.   HABITS:  Does not smoke or drink.   PHYSICAL EXAMINATION:  GENERAL:  On exam, the patient is in no acute  distress.  VITAL  SIGNS:  Blood pressure is 140/70.  Heart rate is 88.  The monitor  reveals nonspecific ST-T abnormality.  Unable to find an EKG initially.  SKIN:  Clear.  HEENT EXAM:  Unremarkable.  CARDIAC EXAM:  No gallop, no rub, no click.  CHEST:  Clear to auscultation and percussion.  ABDOMEN:  Soft.  Bowel sounds are normal.  NEURO EXAM:  Unremarkable.   LABORATORY:  Creatinine is 0.7.  Point-of-care markers are negative times 2.  Hemoglobin is 15.3.  Potassium is 4.0.  Chest x-ray has not been reviewed.   ADDENDUM:  EKG reveals nonspecific ST-T wave changes, unchanged from August  2004 trace.   DISCUSSION:  The patient's chest discomfort has some features that suggest  ischemia, but this could also be GI or other type problems.  Considering her  age and family history, we will do a Cardiolite  study to rule out evidence  of ischemia, assuming that her cardiac markers are negative.  If her cardiac  markers happen to be positive, she will require coronary angiography.      [name], M.D.  Electronically Signed     HWS/MEDQ  D:  12/14/2005  T:  12/15/2005  Job:  [id number]   cc:   Hal T. Stoneking, M.D.  Fax: [phone]

## 2010-10-20 NOTE — Discharge Summary (Signed)
NAME:  [name], [name]                         ACCOUNT NO.:  [id number]   MEDICAL RECORD NO.:  [id number]                   PATIENT TYPE:  OIB   LOCATION:  3005                                 FACILITY:  [facility]   PHYSICIAN:  Stefani Dama, M.D.               DATE OF BIRTH:  Jan 21, 1941   DATE OF ADMISSION:  04/20/2003  DATE OF DISCHARGE:  04/22/2003                                 DISCHARGE SUMMARY   ADMISSION DIAGNOSES:  1. 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  2. Herniated nucleus pulposus L5-S1 right.   DISCHARGE/FINAL DIAGNOSES:  1. 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  2. Herniated nucleus pulposus L5-S1 right.   CONDITION ON DISCHARGE:  Improving.   HOSPITAL COURSE:  Ms. Cuneo is a 70 year old individual who has had  significant back and bilateral leg pain, worse on the right.  She has failed  extensive efforts at conservative therapy.  With the passage of time, MRI  has demonstrated that she has had progression of spondylitic changes at L4-5  and also at L5-S1.  After careful consideration, she was advised regarding  surgical decompression of this process.  She had had previous laminectomy  and diskectomy years ago by Dr. Rosalie Doctor and has done well with that  surgery.  She was admitted to the hospital on April 20, 2003, and  underwent bilateral laminotomies and decompression at the L4-L5 level.  She  underwent a right-sided L5-S1 laminotomy and diskectomy.  This was done with  the help of Dr. Maeola Harman.  She tolerated the procedure well and  immediately felt significant relief of the right leg pain.  She was slow in  ambulating independently and was maintained in the hospital to work with a  little bit of physical therapy.  The pain was under good control and at the  time of discharge, she was given a prescription for Percocet, #40, without  refills; Valium 5 mg,  #20, without refills.  She will be seen in the office  in approximately two weeks' time for further followup.  At the time of  discharge, her condition is improved.                                                Stefani Dama, M.D.    Merla Riches  D:  04/22/2003  T:  04/23/2003  Job:  [id number]

## 2010-10-20 NOTE — Discharge Summary (Signed)
[facility]  Patient:    [name], [name] Visit Number: [id number] MRN: [id number]          Service Type: MED Location: 3700 [phone] Attending Physician:  Ginette Otto Dictated by:   Thora Lance, M.D. Admit Date:  03/19/2001 Discharge Date: 03/19/2001   CC:         Hal T. Stoneking, M.D.   Discharge Summary  REASON FOR ADMISSION:  A 70 year old white female with two-day history of epigastric pain and tightness radiating to her back.  There was no relief with oral antacids and no relief with nitroglycerin.  [name] more comfortable while standing than lying.  She does have history of GER on Prilosec.  She has had a history of a negative Cardiolite study one year ago and a negative cardiac catheterization two years ago.  PHYSICAL EXAMINATION:  VITAL SIGNS:  Blood pressure 137/66, heart rate 94, afebrile.  LUNGS:  Clear.  HEART:  Regular rate and rhythm without murmur, rub or gallop.  ABDOMEN:  Soft, nontender, nondistended with normal bowel sounds and no mass.  EXTREMITIES:  No edema.  LABORATORY:  CBC:  Hemoglobin 12.5, WBC 11.9, platelet 339.  BMP:  Sodium 136, potassium 3.9, chloride 100, bicarbonate 26, BUN 11, creatinine 0.6, glucose 110.  PT 13.0, PTT 35, amylase 81, lipase 36, troponin 0.01.  Creatinine kinase 55, creatinine kinase MB 1.1.  Calcium 9.2.  HOSPITAL COURSE:  The patient was admitted for epigastric pain.  Her EKG showed nonspecific changes and her cardiac enzymes were negative.  She had had negative cardiac workup in the past and her pain was considered to be noncardiac.  The possibility of pancreatitis were considered, and her amylase and lipase were checked and were normal.  She had a CT scan of the chest which did not show evidence of any pulmonary embolus.  CT scan of the lower extremities showed no evidence of DVT.  The patient was treated with Toradol which did briefly help with her pain.  On the  evening of her admission she was given a GI cocktail which significantly reduced her pain for 30 minutes.  The etiology of her pain was felt to be probably related to gastroesophageal reflux.  She was discharged on increased dose of Prilosec and Carafate.  DISCHARGE DIAGNOSES:  1. Chest pain.  2. Abdominal pain.  3. Degenerative joint disease.  4. Hypertension.  5. Hyperlipidemia.  6. Restless leg syndrome.  PROCEDURES:  CT scan of the chest and lower extremities.  DISCHARGE MEDICATIONS:  1. Prilosec 20 mg b.i.d. for 2 weeks and then 20 mg q.d.  2. Carafate liquid 1 gram q.a.c. and q.h.s. for 2 weeks.  3. Ambien 5 mg q.h.s. p.r.n.  4. Lotrel 5/10 q.d.  5. Lipitor 10 mg q.d.  6. Allegra 180 mg q.d.  7. Premarin 1.25 mg q.d.  8. Sinemet SR 50/200 h.s.  9. Sinemet 25/250 q.i.d. 10. Prozac 20 mg q.d.  DIET:  No restrictions.  ACTIVITY:  As tolerated.  FOLLOWUP:  P.r.n. Dictated by:   Thora Lance, M.D. Attending Physician:  Ginette Otto DD:  03/19/01 TD:  03/20/01 Job: [id number]

## 2010-10-20 NOTE — Procedures (Signed)
[facility]  Patient:    [name], [name]                      MRN: [id number] Proc. Date: 12/20/99 Attending:  Verlin Grills, M.D. CC:         Hal T. Stoneking, M.D.                           Procedure Report  REFERRING PHYSICIAN:  Hal T. Stoneking, M.D.  PROCEDURE PERFORMED:  Colonoscopy.  ENDOSCOPIST:  Verlin Grills, M.D.  INDICATIONS FOR PROCEDURE:  [name] is a 70 year old female who is referred for diagnostic colonoscopy as a screen for colorectal neoplasia.  I discussed with [name] the complications associated with colonoscopy and polypectomy including intestinal bleeding and intestinal perforation.  [name] has signed the operative permit.  MEDICAL ALLERGIES:  None.  CHRONIC MEDICATIONS:  Premarin, Sinemet, Ambien, calcium, multivitamin, Prozac, Lipitor, Vioxx, Lotril, Prilosec, Claritin.  PAST MEDICAL HISTORY:  Peptic ulcer disease.  Hypercholesterolemia.  Left ovarian cyst removed.  Hysterectomy.  Cholecystectomy.  Remote back surgeries. Arthroscopic surgery of the right knee.  PREMEDICATION:  Versed 20 mg, Demerol 100 mg.  ENDOSCOPE:  Pediatric Olympus video colonoscope.  DESCRIPTION OF PROCEDURE:  After obtaining informed consent, the patient was placed in the left lateral decubitus position.  I administered intravenous Demerol and intravenous Versed to achieve sedation for the procedure.  The patients blood pressure, oxygen saturation and cardiac rhythm were monitored throughout the procedure and documented in the medical record.  Anal inspection was normal.  Digital rectal was normal.  The Olympus pediatric video colonoscope was then introduced into the rectum and under direct vision, advanced to the cecum as identified by a normal-appearing ileocecal valve. Colonic preparation for the exam today was excellent.  Rectum:  Normal.  Sigmoid colon and descending colon:  Normal.  Splenic  flexure:  Normal.  Transverse colon:  Normal.  Hepatic flexure:  Normal.  Ascending colon:  Normal.  Cecum and ileocecal valve:  Normal.  ASSESSMENT:  Normal proctocolonoscopy to the cecum. DD:  12/20/99 TD:  12/21/99 Job: [id number]

## 2010-10-20 NOTE — Op Note (Signed)
NAME:  [name], [name]                         ACCOUNT NO.:  [id number]   MEDICAL RECORD NO.:  [id number]                   PATIENT TYPE:  OIB   LOCATION:  NA                                   FACILITY:  [facility]   PHYSICIAN:  Stefani Dama, M.D.               DATE OF BIRTH:  04/10/1941   DATE OF PROCEDURE:  04/20/2003  DATE OF DISCHARGE:                                 OPERATIVE REPORT   PREOPERATIVE DIAGNOSES:  1. L4-5 and L5-S1 spondylosis, stenosis, with lumbar radiculopathy on the     right.  2. Herniated nucleus pulposus L5-S1, right.   POSTOPERATIVE DIAGNOSES:  1. and L5-S1 spondylosis, stenosis, with lumbar radiculopathy on the right.  2. Herniated nucleus pulposus L5-S1, right.   PROCEDURE:  Bilateral laminotomies with foraminotomies at L4-L5 right, L5-S1  laminotomy and diskectomy, with operating microscope, micro-dissection  technique.   SURGEON:  Stefani Dama, M.D.   ASSISTANT:  Danae Orleans. Venetia Maxon, M.D.   ANESTHESIA:  General endotracheal.   INDICATIONS FOR PROCEDURE:  The patient is a 70 year old individual who has  had significant back and right lower extremity pain.  She has had weakness  developing in the tibialis anterior group.  She has been followed  conservatively for a number of years.  She has previously had an L5-S1 disk  herniation on the right side, resected years ago by Dr. Jeannett Senior C. Roxan Hockey.  She did quite well for a number of years, but in the past year she has  developed increasing back pain and some right leg pain.  More recently she  has evolved right lower extremity weakness.  She has had an evolving  stenotic condition at L4-L5.  She was taken to the operating room.   DESCRIPTION OF PROCEDURE:  The patient is brought to the operating room  supine on the stretcher.  After the smooth induction of general endotracheal  anesthesia, she was turned prone and the back was shaved and prepped with  DuraPrep and draped in a sterile fashion.  An  elliptical incision was made  around the previous scar, and this was excised.  Dissection was carried down  to the lumbodorsal fascia which was opened on either side of the midline,  and the first spinous process was identified as that of L5 on a localizing  radiograph.  Then the internal laminar space at L4-5 was cleared, first on  the right and then on the left side, and also at L5-S1.  The intralaminar  space was similarly cleared on that right side.  A self-retaining retractor  was placed in the wound.  A laminotomy was then created by removing the  inferior margin of the laminar arch of L4, out to the mesial wall of the  facet.  The common dural tube was identified after removing thick redundant  yellow ligament in this area that was overgrown from the mesial aspect of  the facet.  This in of itself was causing significant compression of the  common dural tube right at the takeoff of the L5 nerve root.  Care was taken  to retract this area medially, and the L5 nerve root was then identified,  protected medially, and then the superior mesial portion of the facet of L5  was undercut, so as to allow relief on the L5 nerve root as it entered the  foramen.  Superiorly the common dural tube was decompressed from the midline  out to the right side.  There was significant overgrowth of the ligamentous  material from the facet joint itself.  Once this decompression was obtained,  and it was felt that the L5 nerve root was well-decompressed, attention was  turned to L5-S1.  Here on the right side there was a previous laminotomy  from the diskectomy.  This area was carefully dissected down.  The common  dural tube was identified and the intralaminar space was identified.  Care  was then taken to dissect along the lateral aspect, to identify and protect  the S1 nerve root.  This nerve root was noted to be significantly scarred in  this position, but there was a notable mass underneath it.  The  nerve root  was gently dissected free and released using a micro-dissection technique.  This procedure was accomplished with the help of Dr. Danae Orleans. Venetia Maxon, who  provided retraction while working with the microscope.  As the nerve was  released, it was evident that there was a fragment of disk underneath it.  This became more apparent as the dissection proceeded and the disk fragment  itself was resected.  The disk space itself was noted to be closed very  tightly.  It was not entered.  None-the-less the removal of the disk  facilitated a good decompression of the S1 nerve root and the effected  foramen.  Referring to the films, it was noted that on the L5-S1 site on the  left side that there was no evidence of any neural entrapment.  Therefore it  was elected not to proceed with dissection in this area.  At L4-L5 there was  significant stenosis, again due to facet overgrowth, and this was relieved  by performing a similar laminotomy and decompression on th  e left side at  L4-L5.  Again the common dural tube was identified and the L5 nerve root was  protected medially, and the significant facet overgrowth was taken down  using a combination of a high-speed drill, 2 and 3 mm Kerrison punch and  rongeurs, to allow this decompression.  Care was taken to make sure that the  nerve root was well-protected.  Hemostasis was achieved with the bipolar  cautery and some judicious use of Gelfoam-soaked thrombin patties, which  were later irrigated away.  In the end, the L5 nerve roots bilaterally were  well-decompressed.  The S1 nerve root on the right side was well-  decompressed.  Hemostasis was achieved adequately.  The area was infiltrated  with 0.5 mL of fentanyl left in the epidural space bilaterally at the L4-L5  level.  The lumbodorsal fascia was then closed with #1 Vicryl in an  interrupted fashion, and #2-0 Vicryl was used subcutaneously and subcuticularly, and #3-0 Vicryl was used to close  the subcuticular skin.  The patient tolerated the procedure well and was returned to the recovery  room in stable condition.  The estimated blood loss was 150 mL.  Stefani Dama, M.D.    Merla Riches  D:  04/20/2003  T:  04/20/2003  Job:  [id number]

## 2010-10-20 NOTE — Consult Note (Signed)
[place]. [facility]  Patient:    [name]                       MRN: [id number] Proc. Date: 05/15/99 Adm. Date:  [id number] Attending:  Ginette Otto CC:         Hal T. Stoneking, M.D.                          Consultation Report  REASON FOR CONSULTATION:  Chest pain.  IMPRESSIONS: 1. Chest and epigastric pain, etiology uncertain. 2. History of peptic ulcer disease November 1988. 3. History of hiatal hernia with gastroesophageal reflux disease and stricture. 4. 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 5. History of dyslipidemia, treatment x 3 years now. 6. Hypertension. 7. Restless leg syndrome.  RECOMMENDATIONS: 1. Agree with admission for rule out of myocardial infarction, as you are doing. 2. Agree with intravenous heparin and IV nitroglycerin. 3. Resting chest pain study with Cardiolite injection for possible coronary    ischemia.  A defect would indicate ongoing coronary insufficiency as an etiology    for her chest pain. 4. Prilosec 20 mg p.o. b.i.d. 5. Carafate 1 g q.6h. (a.c. and h.s.). 6. May require cardiac catheterization with coronary angiography to definitively    rule the possibility of an acute coronary syndrome.  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  She has been nauseated and induced vomiting once at home.  No spontaneous emesis.  There is radiation of the discomfort to her back.  It is not relieved or improved with belching or emesis.  It is not exacerbated by cough, deep breathing, or applied pressure.  She does have a history of chest discomfort with EKG changes later attributed to hypokalemia, serum potassium 2.5 in 1992.  This led to a cardiac catheterization by Dr. Chanda Busing,  which was significant for the absence of obstructive coronary disease.  She did have a myocardial perfusion study done in August 1998 for recurrent chest pain that showed an ejection fraction of 81% and findings compatible with less attenuation but no ischemia.  Here in the emergency room she has received a GI cocktail x 2 with very little improvement in her symptoms.  She rates her pain as a 6 to 8/10.  It was constant beginning yesterday evening, throughout the night, and she did sleep intermittently.  She denies any orthopnea, PND, or significant lower extremity pedal edema.  Her cardiac risk factors are age, the presence of dyslipidemia, hypertension. he did have a sister who had a bypass surgery in her 83s.  PAST MEDICAL HISTORY:  As above.  PAST SURGICAL HISTORY:  Status post cholecystectomy; lumbar laminectomy x 2; recent left knee surgery, arthroscopic; and hysterectomy with USO.  CURRENT MEDICATIONS:  1. Premarin 1.25 mg p.o. q.d.  2. Sinemet 25/250 at 4 p.m. daily.  3. Sinemet CR 50/200 at h.s.  4. Ambien 5 mg p.o. q.h.s.  5. Calcium carbonate t.i.d.  6. Prozac 20 mg p.o. q.d.  7. Lipitor 20 mg p.o. q.d.  8. Vioxx 25 mg p.o. q.d.  9. Lotrel 5/100 1 p.o. q.d. 10. Prilosec 20 mg p.o. q.d. 11. Claritin 10 mg p.o. q.d.  ALLERGIES:  None known.  FAMILY HISTORY:  Mother died  at age 74, had Alzheimers disease.  Father died at  age 33 of an MI; prior MI in his 1s.  Two brothers without CAD.  One sister as  above.  SOCIAL HISTORY:  [name] separated for the past three months.  She had previously been married for 38 years.  Two sons alive and well.  One son is a IT sales professional nd works here.  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  She denies any tobacco use, has occasional social glass of wine.  REVIEW OF SYSTEMS:  She wears glasses, has good dentition, not hard of hearing.  She has episodic dysphagia, which is improved with Prilosec.  Denies  any melena, constipation, diarrhea.  No hematochezia, hematuria, or dysuria.  She has some chronic low back pain and major joint pain.  No muscle weakness.  She has no bleeding or bruising tendencies.  No history of a seizure and has been treated n the past for depression but now recovered.  PHYSICAL EXAMINATION:  VITAL SIGNS:  Blood pressure 136/70, pulse 80 and regular, respiratory rate 20,  temperature 98.1.  She has an O2 saturation of 98% on room air.  GENERAL:  This is a mildly overweight 70 year old woman in mild discomfort secondary to left chest pain.  HEENT:  Remarkable for normocephalic appearance.  Pupils are equal, round and reactive to light and accommodation.  Extraocular movements are intact. Sclerae non-icteric.  Extra-auditory canals are clear.  Oral mucosa is pink and moist.  Tongue is not coated.  Pharynx is without injection.  NECK:  Brisk bilateral carotid upstrokes without significant jugular venous distention, sitting upright, ______ degrees inclination.  There are no bruits.   CHEST:  Clear with good excursion bilaterally.  Vesicular breath sounds are heard throughout.  There is no costophrenic angle tenderness.  HEART:  Regular rhythm.  Normal S1 and S2 is heard.  There is a 2/6 systolic ejection murmur along the left sternal border radiating into the apex.  No gallop, rub, or click noted.  ABDOMEN:  Soft, flat, and nontender without hepatosplenomegaly or midline pulsatile masses.  Nondistended.  Bowel sounds are present in all quadrants.  No epigastric pain on deep palpation.  No midline abdominal mass.  EXTREMITIES:  Full range of motion.  Intact distal pulses.  No tenderness, edema, or varices.  NEUROLOGIC:  The patient is alert and oriented x 3.  No focal deficits.  SKIN:  Warm, dry, and clear.  ACCESSORY CLINICAL DATA:  Chest x-ray shows mild cardiac enlargement.  No active disease.  White blood cell count 14,700, hemoglobin 11.7,  hematocrit 35.8, platelets 306,000, Neutrophils are slightly increased with a left shift.  Serum electrolytes: BUN,  creatinine, glucose normal.  Calcium normal.  Initial CK is 41, MB 0.4. Troponin is less than 0.3.  Electrocardiogram shows nonspecific T wave abnormality with flattened or upright T waves where previously they had been inverted.  COMMENTS:  Mrs. [name] presents at an age and with risk factors that are typical for the onset of coronary disease in this patient population; however, there is  very little objective evidence of coronary ischemia.  This is not unusual.  She has not received significant relief with GI measures.  The perfusion study will be examined carefully.  Any significant defects will lead to prompt cardiac catheterization.  Otherwise, treatment will be medical as outlined above, awaiting further CK and troponin enzyme levels prior to further intervention.  If these remain negative and once pain is stabilized, the stress  portion of a Cardiolite  might be undertaken.  Thank you very much for allowing me to assist in the care of Ms. Navil Kole. It has been a pleasure to do so.  I will discuss her further care with you. DD:  05/15/99 TD:  05/15/99 Job: [id number]

## 2010-10-20 NOTE — H&P (Signed)
[place]. [facility]  Patient:    [name]                       MRN: [id number] Adm. Date:  [id number] Attending:  Cathren Laine                         History and Physical  CHIEF COMPLAINT:  [name] is a very nice 70 year old white female.  [name]  being evaluated for chest pain.  She tells me that she did have a normal cardiac catheterization by Dr. Madaline Savage approximately eight years ago.  She then had some recurrent chest discomfort that was evaluated by Dr. Lum Babe, with a restress Sestamibi study done in August 1998, which was normal.  She also had a  ventilation perfusion scan at that time which was normal.  Her history starts yesterday evening when she stated she developed an epigastric and left-sided chest pain that was very dull and squeezing in nature.  The pain  then radiated to her left chest wall underneath her left breast.  She had no associated diaphoresis, nausea, or shortness of breath.  She tried a number of things, including taking her Prilosec and drinking fluids, which did not help. The pain persisted this morning, when she called in and was asked to come in for an  evaluation.  ALLERGIES:  No known drug allergies.  CURRENT MEDICATIONS:  1. Premarin 1.25 mg q.d.  2. Sinemet 25/250 one q.d.  3. Sinemet CR 50/200 q. night.  4. Ambien 5 mg q. night.  5. Calcium carbonate t.i.d.  6. Multiple vitamin one qd  7. Prozac 20 mg q.d.  8. Lipitor 20 mg q.d.  9. Vioxx 25 mg q.d. 10. Lotrel 5/10 mg one q.d. 11. Prilosec 20 mg q.d. 12. Claritin 10 mg q.d.  PAST MEDICAL HISTORY: 1. Remarkable for peptic ulcer disease in November 1998. 2. History of a hiatal hernia with stricture, evaluated by Dr. Charolett Bumpers in    the past. 3. 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 4. History of elevated cholesterol. 5. Restless leg syndrome, seen by Dr. Sonny Dandy in the past.  PAST SURGICAL  HISTORY:  She had an ovarian cyst removed in 1959, on the left.  hysterectomy in 1975.  A cholecystectomy in 1993, by Dr. Lydia Guiles. Mardella Layman.  She has had two back surgeries in the early 1990s, by Dr. Alanson Aly. Roxan Hockey.  She has had arthroscopic surgery on her right knee by Dr. Illene Labrador. Aplington.  FAMILY HISTORY:  Mother had severe Alzheimers disease.  Father died at the age 38 72, with no known chronic medical problems.  Two brothers and one sister in good health.  Two sons in good health.  SOCIAL HISTORY:  [name] is separated.  She works for a Estate agent. She has two grown sons.  She does not smoke.  Rarely consumes alcohol.  REVIEW OF SYSTEMS:  No headache, no change in her vision.  Cardiopulmonary: Please see the history of present illness.  No abdominal pain.  No change in urination or bowel movements.  PHYSICAL EXAMINATION:  VITAL SIGNS:  Blood pressure 124/70, pulse 88, respirations 20.  SKIN:  Warm and dry.  HEENT:  Pupils equal, round, reactive to light.  Discs sharp.  Vasculature normal. Tympanic membranes normal.  Oropharynx moist.  NECK:  No jugular venous distention or thyromegaly.  LUNGS:  Clear.  HEART:  A regular rate and rhythm without murmurs, rubs, or gallops.  CHEST:  No chest wall tenderness.  ABDOMEN:  Obese, no hepatosplenomegaly.  No masses palpated.  RECTAL:  Normal tone, stool heme-negative.  EXTREMITIES:  No cyanosis, clubbing, or edema.  LABORATORY DATA:  Available at this time:  The electrocardiogram reveals "pseudo-normalization of her previously-flipped lateral T-waves.  ASSESSMENT:  Persistent chest pain with question significant changes on electrocardiogram.  PLAN:  She needs a hospital admission with rule out myocardial infarction. Will start her on IV nitroglycerin and IV heparin and obtain a cardiology consultation. DD:  05/15/99 TD:  05/15/99 Job: [id number]

## 2010-10-20 NOTE — H&P (Signed)
[place]. [facility]  Patient:    [name]                       MRN: [id number] Adm. Date:  02/07/00 Attending:  Fayrene Fearing P. Aplington, M.D. Dictator:   Druscilla Brownie. Shela Nevin, P.A. CC:         Hal T. Stoneking, M.D.             Dr. Montez Morita                         History and Physical  DATE OF BIRTH:  [birth year]  CHIEF COMPLAINT:  "Pain in my left knee."  HISTORY OF PRESENT ILLNESS:  This 70 year old female who has been seen by [name] with continuing progressive problems concerning her left knee. She has constant pain and has to drag her leg. She now uses a cane for ambulation. She has had marked interference with day-to-day activities due to the pain in her knee. [name] usually an active lady; but unfortunately, this has caused her to progress towards a sedentary lifestyle.  X-rays have shown a bone-on-bone deformity in particular in the medial compartment of the knee. She has painful crepitus with range of motion. She also has developed a varus deformity. It was felt after much discussion the patient would benefit with surgical intervention due to the fact she has failed conservative care with pain medication and anti-inflammatories, as well as a cane. Hal T. Stoneking, M.D. is aware of this patients upcoming surgery.  PAST SURGICAL HISTORY:  Surgically, the patient had ovarian cysts removed at age 65, hysterectomy at age 62, and she retains her right ovary; lumbar laminectomy in 1994, and again in 1996. She had a cholecystectomy via scope in 1995, a right knee arthroscopy in 1998, and a left knee arthroscopy in 2000.  PAST MEDICAL HISTORY:  She has hypercholesterolemia and hypertension.  CURRENT MEDICATIONS: 1. Premarin 1.25 mg one q.d. 2. Lotrel 5/10 mg one q.d. 3. Mobic 7.5 mg one q.d. 4. Sinemet SA 50/200 p.o. h.s. 5. Sinemet generic 25/250 mcg p.o. q.i.d. 6. Claritin p.r.n. 7. Lipitor 10 mg p.o. q.d. 8. Ambien 5 mg p.r.n. h.s.  sleep. 9. Ultram 50 mg one p.o. q.6-8h. pain.  FAMILY PHYSICIAN:  Hal T. Stoneking, M.D.  ALLERGIES:  Severe intolerance to SULFA which causes nausea.  SOCIAL HISTORY:  The patient may have a glass wine perhaps every other day. No intake of tobacco products.  FAMILY HISTORY:  Positive for mother dying of Alzheimers. Father with coronary artery disease. Brothers and sisters with DJD and migraine headaches.  REVIEW OF SYSTEMS:  CNS:  No seizures, stroke, paralysis, numbness, or double vision. The patient does have restless leg syndrome. CARDIOVASCULAR:  No chest pain. No angina or orthopnea. RESPIRATORY:  No productive cough. No hemoptysis. No shortness of breath. GASTROINTESTINAL:  No nausea, vomiting, or bloody stools. GENITOURINARY:  No discharge, dysuria, or hematuria. MUSCULOSKELETAL: Primarily in present illness with her left knee.  PHYSICAL EXAMINATION:  GENERAL:  Alert, cooperative, friendly, 70 year old female.  VITAL SIGNS:  Blood pressure 130/80, pulse 80, respirations are 12.  HEENT:  Normocephalic. PERRLA. EOM intact. Oropharynx is clear.  CHEST:  Clear to auscultation. No rhonchi. No rales.  HEART:  Regular rate and rhythm. No murmurs are heard.  ABDOMEN:  Soft, nontender. Liver, spleen not felt.  GENITALIA/RECTAL/PELVIC/BREASTS:  Not done. Not pertinent to present illness.  EXTREMITIES:  Left knee  as in present illness above.  ADMISSION DIAGNOSES: 1. Osteoarthritis left knee. 2. Hypercholesterolemia. 3. Hypertension. 4. Restless leg syndrome.  PLAN:  The patient to undergo total knee replacement arthroplasty. Plan to be home after her regular hospitalization with home PT. She has not donated any blood for this procedure. We will ask Hal T. Stoneking, M.D. to follow along with [name] during this patients hospitalization. DD:  02/01/00 TD:  02/01/00 Job: [id number]

## 2010-10-20 NOTE — Discharge Summary (Signed)
[place]. [facility]  Patient:    [name], [name] Visit Number: [id number] MRN: [id number]          Service Type: MED Location: [id number] Attending Physician:  Ginette Otto Dictated by:   Hal T. Stoneking, M.D. Admit Date:  03/19/2001 Discharge Date: 03/19/2001                             Discharge Summary  ADMITTING DIAGNOSES:  Chest pain, unclear etiology.  DISCHARGE DIAGNOSES: 1. Chest pain, possibly gastrointestinal in nature. 2. Degenerative joint disease. 3. Hypertension. 4. Hypercholesterolemia. 5. Restless leg syndrome.  BRIEF HISTORY:  The patient is a very nice 70 year old woman who came into the ER with a two day history of epigastric pain and tightness radiating to her back.  She had had no relief with antacids.  She has had some dyspnea on exertion, but no change in the pain with exertion.  She had had a prior cardiac catheterization which was normal some years ago, also a negative thallium study about two years ago, and negative upper endoscopy approximately one year ago for evaluation of a similar discomfort.  She was admitted to the hospital for evaluation.  PHYSICAL EXAMINATION:  VITAL SIGNS:  Blood pressure 137/66, pulse 94, O2 saturation 99% on room air.  HEENT:  Unremarkable.  LUNGS:  Clear.  HEART:  Regular rate and rhythm without murmurs, rubs, or gallops.  ABDOMEN:  Soft, no hepatosplenomegaly or masses palpated.  LABORATORY DATA:  White count 11.9, hemoglobin 12.5, platelet count 339,000, CK 55 with an MB of 1.1, troponin 0.01, sodium 136, potassium 3.9, chloride 100, bicarb 26, BUN 11, creatinine 0.6, glucose 110, liver function studies normal.  EKG normal sinus rhythm, nonspecific changes, no change from previous EKG.  HOSPITAL COURSE:  The patient was admitted to a telemetry bed, remained in normal sinus rhythm.  She had a CT scan of the chest and pelvis, which was unremarkable for pulmonary  embolus.  No clear abnormality for the cause of the pain.  On the evening of discharge, he had been given a GI cocktail and within about 30 minutes, her pain had improved.  It was felt that her pain may have been due to some reflux symptoms and her Prilosec was increased to 20 mg b.i.d.  She was placed on Carafate 1 g a.c. and q.h.s.  [name] discharged in an improved condition.  MEDICATIONS AT DISCHARGE:  1. Ambien 5 mg q.h.s. p.r.n.  2. Lotrel 5/10 mg p.o. q.d.  3. Lipitor 10 mg p.o. q.d.  4. Allegra 180 mg a day.  5. Premarin 1.25 mg p.o. q.d.  6. Serevent 50/200 q.h.s.  7. Sinemet 25/250 q.i.d.  8. Prozac 20 mg a day.  9. Pepcid 20 mg b.i.d. 10. Carafate 1 g a.c. and q.h.s.  FOLLOWUP:  She will be seen in the office again in about 3-4 weeks. Dictated by:   Hal T. Stoneking, M.D. Attending Physician:  Ginette Otto DD:  03/20/01 TD:  03/20/01 Job: [id number]

## 2010-10-20 NOTE — Discharge Summary (Signed)
[employer]. [facility]  Patient:    [name], [name]                    MRN: [id number] Adm. Date:  [id number] Disc. Date: [id number] Attending:  Marlowe Kays Page Dictator:   Grayland Jack, P.A. CC:         Hal T. Stoneking, M.D.   Discharge Summary  ADMITTING DIAGNOSES: 1. Osteoarthritis of the left knee. 2. Hypercholesterolemia. 3. Hypertension. 4. Restless legs syndrome.  DISCHARGE DIAGNOSES: 1. Status post left total knee arthroplasty. 2. Hypercholesterolemia. 3. Hypertension. 4. Restless legs syndrome.  PROCEDURES:  Left total knee arthroplasty replacement utilizing [employer]. Surgeon was [employer]. Aplington, M.D. and assistant Georges Lynch. Gioffre, M.D.  CONSULTS:  None.  BRIEF HISTORY:  The patient is a 70 year old female with longstanding history of left knee pain.  X-rays do show bone-on-bone deformity, specifically in the medial compartment.  She has been tried on numerous conservative treatments. Over the past few months, the patient has had constant pain in the leg, causing her to utilize a cane for ambulation.  It interfered with her activities of daily living.  It was felt that due to the patients positive findings on x-rays and failure of conservative treatment, she would best benefit from surgical intervention at this time.  Risks, benefits, and complications of the surgery were discussed with the patient in detail and she agreed to proceed.  HOSPITAL COURSE:  On February 07, 2000, the patient underwent the above stated procedure which she tolerated well without complications.  The patient was started on Coumadin therapy per pharmacy protocol for a DVT prophylaxis.  The patient did have a postoperative epidural placed per anesthesia.  Physical therapy was initiated postoperatively for total knee protocol.  Occupational therapy was initiated postoperatively to assist with activities of daily living.  On February 09, 2000, anesthesia  discontinued her epidural.  The patient continued to have good pain control with p.o. analgesics.  The patient progressed nicely with physical therapy and was able to tolerate her partial weightbearing status.  On February 12, 2000, the patient was awake and alert.  She had minimal complaints of pain.  States it was tolerable with the Percocet.  She did have a small bowel movement yesterday and _________.  [name] voiding without difficulty.  She denies nausea and vomiting at this time.  On exam, the patient was afebrile and her vital signs were stable.  The abdomen was soft and nontender.  The left knee dressing was dry.  The incision site was clean and dry with no erythema or discharge.  Her back was soft and nontender.  She was neurovascularly intact to the left lower extremity.  She was moving her foot and knee well.  The patient had good quad control.  It was felt that the patient was stable for discharge to home at this time.  LABORATORY DATA:  Preoperative coagulations were within normal limits.  The patient was placed on Coumadin therapy per pharmacy protocol and PT and INR were followed by pharmacy.  Prior to discharge, the patients PT was 21.8 and INR was 2.7.  Routine chemistries on admission were within normal limits. Urinalysis prior to admission was within normal limits.  CBC prior to admission within normal limits.  The patients hemoglobin did drop to a low of 8.7 and hematocrit of 25.4.  However, the patient was slowly increasing prior to discharge and her hemoglobin was 8.9 and hematocrit 26.4.  CONDITION ON DISCHARGE:  Improved and stable.  DISCHARGE PLANS:  The patient is being discharged to home.  She will have home health physical therapy and home health nursing for blood draws.  The patient is to continue with her partial weightbearing, 50% to the left lower extremity.  [name] to continue with her total knee protocol.  She will require daily dry dressing changes  to the left knee.  She may shower ad lib.  [name] to resume home diet and home medications.  The patient is to increase her oral fiber intake as well as water intake.  DISCHARGE MEDICATIONS: 1. Percocet 5 mg, #40. 2. Robaxin 500 mg, #30. 3. Chromogen Forte, #20. 4. Coumadin per pharmacy protocol. 5. The patient is to utilize Peri-Colace, over-the-counter b.i.d. as needed.  DISPOSITION:  The patient will need to return to the clinic in 10 days to follow up with Dr. Simonne Come for staple removal.  Please call [phone] to schedule an appointment. DD:  02/12/00 TD:  02/12/00 Job: [id number]

## 2010-10-20 NOTE — Discharge Summary (Signed)
NAME:  [name], [name] NO.:  [id number]   MEDICAL RECORD NO.:  [id number]          PATIENT TYPE:  IPS   LOCATION:  0500                          FACILITY:  [facility]   PHYSICIAN:  Geoffery Lyons, M.D.      DATE OF BIRTH:  [birth year]   DATE OF ADMISSION:  03/04/2008  DATE OF DISCHARGE:  03/09/2008                               DISCHARGE SUMMARY   CHIEF COMPLAINT:  This was the first admission to [facility] for this 70 year old female married, voluntarily admitted.  She  took about 80 tablets of temazepam 30 mg over the course of 24-hour  period.  Endorsed she has a lot of fear that her 13 year old husband who  has cardiac problems will die before she will.  She said I wanted to  make sure I went first.  She said she cannot picture herself living  without him.  They have been married for the past 7-1/2 years but knew  each other earlier in life as they worked together at the [employer].  Most recently he has had problem with unstable blood pressure, has a  history of heart problems.  She was feeling very fearful that he did not  have long to live.  She apparently had a similar overdose on Valium in  March of this year, although she denied prior attempts.   PAST PSYCHIATRIC HISTORY:  No current outpatient treatment.  She had as  already stated, an overdose March 2009, overdosed on Valium.  History of  counseling about 20 years prior to this admission with [facility] Scrape.  Also saw Dr. Bradly Chris at [facility] 25 years ago.  Does not remember  any medications.  Has one prior hospitalization at [facility] about 20  years ago.   MEDICAL HISTORY:  Cervical disk disease and hypertension, dyslipidemia,  restless leg, degenerative disk disease.   MEDICATION:  1. Restoril 30 mg at night.  2. Vicodin as needed for back pain.  3. Norvasc 10 mg per day.  4. Zocor 40 mg per day.  5. Sinemet 20-200 three times daily.  6. Sinemet 50-200 at night.  7. Aspirin 81 mg per  day.  8. Neurontin 300 mg three times daily.   PHYSICAL EXAM:  Failed to show any acute findings.   LABORATORY WORK:  CBC:  White blood cells 6.5, hemoglobin 12.3, sodium  141, potassium 4.3, BUN 12, creatinine 0.67, glucose 99, SGOT 12, SGPT  8, total bilirubin 0.7.  UDS positive for benzodiazepines.   MENTAL STATUS EXAM:  Reveals alert cooperative female.  Has a rather  dazed affect.  Initially having a hard time remembering some things.  Some psychomotor retardation but she mostly just woke up when we  interviewed her.  Speech was normal in rate, tempo and production.  Affect is broad.  She goes from being perplexed to being full range to  crying.  Endorsed that she is relieved she did not die.  Said she does  not want to die.  She wants to live and take care of the grandchildren.  There are no delusions.  Denies any active suicidal or homicidal ideas.  There are no hallucinations.  Cognition well-preserved.   ADMITTING DIAGNOSES:  Axis I:  Major depressive disorder.  Axis II:  No diagnosis.  Axis III:  Hypertension, dyslipidemia, degenerative disk disease,  restless leg syndrome.  Axis IV:  Moderate.  Axis V:  GAF upon admission 35.  Highest GAF in the last year 70.   COURSE IN THE HOSPITAL:  She was admitted, started individual and group  psychotherapy.  She was maintained on her medication except the Restoril  and the baclofen.  As already stated, she had an overdose of Restoril.  Husband has been really sick.  There is a 20 year difference in age.  He  is 26, status post open heart surgery.  When all this happened 9-1-1 was  called for both her and her husband and her husband was at the time of  this assessment in the cardiac unit.  She did have marital problems with  her first husband at that time 20 years ago.  Saw Dayton Scrape for  counseling and was in [facility] after she was trying to get off  medications.  Did endorse that she could not accept that the husband   could die before her, so she had thought about killing herself.  She  cannot see herself living without him.  Has known him for 30 years.  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  He  became a widower and she said that she separated from her husband, they  found each other and got married.  She mostly minimized her suicide  gesture.  Son did share that she tried to again overdose in March.  She  was wanting to be discharged from the hospital.  She had been on Prozac  for a long time, would like something for anxiety.  Recently she was  started on Neurontin for neuropathy.  There was a family session October  3 with youngest son.  She was focused on her husband's illness and his  problems.  When asked about her needs she would spend 10-15 minutes  talking about her husband, then would briefly address her needs.  Identifies herself as a caretaker.  She did endorse difficulty with  sleep due to working third shift for years and also the feeling that she  needs to be awake in case someone needs her.  There ws some conflict in  interaction with her son.  She was able to express some of her concerns.  The son suggested assisted-living or independent living for her and her  husband.  He and the rest of the family were concerned about her falling  and not being able to get help or the husband falling and her trying to  hurt herself trying to help him.  She stated that she had discussed this  with her husband and they both felt that they might not be ready for  this at this time.  Endorsed that she would think about it.  The son  stated that an aunt  recently killed herself with a gun and that the  patient stated that if I was going to do it, 'I would do it like that,  but she denies access to a gun.  The son later called and continued to  make some allegations that she has been spending lots of money, that he  does not trust the therapist that she was going  to see as  this was the  same therapist she used when she divorced her first husband, the son's  father.  He reports seeing a pattern of unusual acting out by his mother  the same after they divorced 8 years prior to this admission.  Concerned  about her harming herself and harming her husband.  The son also reports  that the patient was interested in being a foster parent and taking  newborn babies to care for until placement can be found.  She has been  babysitting the grandchildren.  There was a lot of expressed feelings on  the part of the son.  The patient on the other hand was endorsing that  she would not hurt herself.  Was still dealing with the situation that  the husband was older and sick and would probably die before her.  York Spaniel  that he has been telling her what to do when he is gone, so she keeps  being reminded that he is going to die.  York Spaniel that they had argued about  this before.  This apparently set the stage for the overdose.  York Spaniel that  she has talked to him about it and seems that he is getting it now.  He was still in the hospital on October 5.  She wants to be discharged  to be able to be back home, take care of him when he is discharged.  We  continued to have her deal with the imminent loss.  We worked on Materials engineer.  She eventually called her son and confronted him with what he  has said.  Said that she would not hurt herself.  Willing to come to the  intensive outpatient program and see Dayton Scrape for psychotherapy.  She  over and over again stated that she would not hurt herself or her  husband.  Husband was contacted and he had no concerns about her  being  discharged.  The husband was hopeful to be discharged on October 6.  He  had no concern that the patient would harm herself upon discharge.  Upon  discharge she was pleasant, cooperative and stated that she was able to  provide care for herself and her husband.  She was going to come to IOP.  Neighbor was going to  help with  transportation.  She again denied any suicidal or homicidal ideations.   DISCHARGE DIAGNOSES:  Axis I:  Major depressive disorder.  Axis II:  No diagnosis.  Axis III:  His hypertension, dyslipidemia, degenerative disk disease,  restless leg syndrome.  Axis IV:  Moderate.  Axis V:  Upon discharge 50-55.   DISCHARGED ON:  1. Prozac 40 mg per day.  2. Neurontin 300 mg 3 times daily.  3. Norvasc 10 mg per day.  4. Zocor 40 mg per day.  5. Sinemet 10-100 two tablets by mouth 3 times daily.  6. Sinemet 25-100 two tablets by mouth at bedtime.  7. Aspirin 81 mg per day.   [facility] see IOP and Dr.  Pete Glatter for urinary tract symptomatology.      Geoffery Lyons, M.D.  Electronically Signed     IL/MEDQ  D:  04/01/2008  T:  04/01/2008  Job:  [id number]

## 2010-10-20 NOTE — Op Note (Signed)
[facility]  Patient:    [name], [name]                    MRN: [id number] Proc. Date: 02/07/00 Adm. Date:  [id number] Attending:  Marlowe Kays Page                           Operative Report  PREOPERATIVE DIAGNOSIS:  Degenerative arthritis of left knee.  POSTOPERATIVE DIAGNOSIS:  Degenerative arthritis of left knee.  OPERATION:  Osteonics total knee replacement, left.  SURGEON:  Illene Labrador. [place], M.D.  ASSISTANT:  Georges Lynch. Darrelyn Hillock, M.D.  ANESTHESIA:  General with epidural by Dr. Ivin Booty of anesthesia at the conclusion of the case.  INDICATIONS FOR PROCEDURE:  Bone-on-bone abutment medially and severe pain, unresolved by other measures.  DESCRIPTION OF PROCEDURE:  Provided prophylactic antibiotics, satisfactory general anesthesia, and Foley catheter inserted.  Pneumatic tourniquet applied, Surefoot, and lateral hip stabilizer also applied.  The left leg was prepped with DuraPrep from tourniquet to ankle, draped in a sterile field, Ioban employed, and a vertical midline incision down to the patellar mechanism of medial parapatellar incision to open the joint.  Suprapatellar adhesions were removed.  Osteophytes around the femur and patella were also removed. The ______ and medial collateral ligament were freed up at the proximal medial tibia.  The anterior portion of both menisci were resected as was the ACL, and the patella was everted and the knee joint flexed.  I then made a 5/16 inch drill hole of the distal femur followed by the canal finder and the access liner was set at 5 degrees for the left knee.  I elected to make a 10 mm cut off the distal femur since she had no flexion contracture.  I was then going to use the cradle device to measure the distal femur, but because of tightness posteriorly, I elected to proceed with the tibial resection first.  I first made a leveling cut of the proximal tibia and removed the remnants of menisci  posteriorly.  I measured a #5 tray which was placed and centered, hole made and enlarged with a step cut drill followed by a canal finder.  I then placed an intermedullary rod, attached to it the cutting jig for the proximal tibia set at 6 mm for the medial tibial plateau.  First, I lined the cutting jig using the external rods within the bimalleolar distance, and then made my initial 6 mm cut.  This allowed [name] room to go back and work on the femur, and the cradle device was used, confirming preoperative templating a size 5.  The described holes were placed and the distal femoral cutting and chamfering jig was then placed where anterior and posterior cuts, and posterior and anterior chamferings were then made.  I then placed the guide on the distal femur for creating the patellar groove, and we then returned to the tibia where the tibial tray was placed, and using an external liner once again, and a 10 mm spacer, we finally had the right amount of width in the tibial spacer, and good medial and lateral stability.  The described lines were placed on the tibial tray on to the anterior tibia using the bimalleolar distance and the external rod as a guide.  With the knee in extension, I then went ahead and used the 10 mm recess cutting jig for the 26 patellar which she sized at.  The 10 mm  recess cut was made followed by the guide, and making the three fixation drill holes which were made.  I then placed a trial 26 patella and trimmed off excess bone around the perimeter.  The knee was then flexed once again and the #5 tibial tray placed on the tibia and stabilized there with three pins using an external alignment guide that we had previously noted.  We then reamed the tibial keel for a 5 cemented.  The knee was then waterpicked while methyl methacrylate was being mixed.  Three components were then individually glued inside first with the tibial component which was tightly impacted and  excess methyl methacrylate being removed, an 8 mm spacer was placed and the femoral component was then impacted, and finally with the knee held in extension, the patellar component was glued in and held with the patellar clamp.  Once the methacrylate had hardened, we trimmed up excess methyl methacrylate from around the components, and then placed a trial 10 mm spacer which brought the knee into full extension, excellent flexion, and good stability medially and laterally, and we went with it.  The knee was irrigated with antibiotic solution and trial 10 mm spacer placed.  A Hemovac was then placed.  The lateral release performed and the wound closed with interrupted #1 Vicryl in two layers in the quadriceps, and distally, a layer in the synovium and in the capsule, ________ and 2-0 Vicryl in the subcutaneous tissue, and staples in the skin.  A Betadine Adaptic dry sterile dressing were applied.  The tourniquet was released.  Dr. Ivin Booty then began his work for the epidural.  The estimated blood loss was basically none.  No blood replacement.  No operative complications. DD:  02/07/00 TD:  02/08/00 Job: [id number]

## 2010-10-20 NOTE — Op Note (Signed)
[facility]  Patient:    [name], [name]                    MRN: [id number] Proc. Date: 02/07/00 Adm. Date:  [id number] Attending:  Marlowe Kays Page CC:         ANESTHESIA DEPARTMENT   Operative Report  PROCEDURE:  Insertion of epidural catheter for postoperative pain management.  BRIEF HISTORY:  [name] was brought to the operating room today by Dr. Fayrene Fearing [place] for a total knee replacement.  I spoke with her preoperatively concerning the risks and benefits of epidural analgesia postoperatively.  She understood these to be infection, subarachnoid ______ injection and nerve damage.  She wished to proceed with this.  DESCRIPTION OF PROCEDURE:  AT the conclusion of the operation and while still under general anesthesia, she was turned in the left lateral position and her back was prepped in a sterile fashion.  At the L3-4 interspace in the midline, a 17 gauge Tuohy needle was passed to a loss of resistance to normal saline 3 cc.  There was a negative aspiration of blood or CSF, and an 18 gauge epidural catheter was passed through the needed to a depth of 4 cm below the end of the needle.  The needle was removed and catheter was secured.  There was a negative aspiration of blood or CSF from the catheter and a bolus injection of 14 cc of 0.5% lidocaine containing 100 mCg of fentanyl was given through the catheter.  The patient was then turned supine, suctioned, and extubated in the operating room uneventfully.  She was taken to the PACU in good condition and will be maintained on the continuous infusion in her epidural space until we remove the catheter.  The anesthesia department will follow her during this time. DD:  02/07/00 TD:  02/08/00 Job: [id number]

## 2010-10-20 NOTE — H&P (Signed)
NAMEJATON, [name] NO.:  [id number]   MEDICAL RECORD NO.:  [id number]          PATIENT TYPE:  INP   LOCATION:  1824                         FACILITY:  [facility]   PHYSICIAN:  Hollice Espy, M.D.DATE OF BIRTH:  [birth year]   DATE OF ADMISSION:  12/13/2005  DATE OF DISCHARGE:                                HISTORY & PHYSICAL   PRIMARY CARE PHYSICIAN:  Dr. Lambert [name].   CHIEF COMPLAINT:  Chest tightness.   HISTORY OF PRESENT ILLNESS:  The patient is a 70 year old white female with  past medical history of Parkinson's, depression, and hyperlipidemia and  hypertension, who has been doing relatively well with no complaints.  She  had an episode of chest tightness and palpitations 20 years ago and had a  cardiac catheterization that was found to be completely normal.  She was,  however, found to have significant hypokalemia at the time, which was felt  to be the cause of this.  Since then, she has had no problems.  She tells me  that her cholesterol is followed closely by Dr. Pete [name] as has her  hypertension, both of which are relatively well controlled although Dr.  Pete [name] would like her triglycerides to be improved.  Other than that, she  has had no complaints other than she has had a few episodes of what appears  to be benign positional vertigo in the last couple of weeks, the most recent  one occurring yesterday.  She had some unsteady gait, nausea and room-  spinning episodes but nothing today.  Then, starting today, the patient  started having some chest tightness.  It was located in the mid sternal and  mid epigastric region.  There was no radiation to the arm, no numbness, no  associated shortness of breath although she felt slightly light-headed.  Immediately she thought it was her gallbladder but when the symptoms  persisted she became concerned and had work check her blood pressure, which  was found to be elevated.  She became concerned and called the  paramedics.  The patient was given nitroglycerin sublingual en route, which did nothing  to change the symptoms.  The patient, again, has been described more as a  squeezing, not much of pain, sharp.  She continued to have these symptoms  and finally came into the emergency room.  In the emergency room, she was  noted to be having stable vital signs.  She was given nitroglycerin patch,  which initially started to relieve some of her pain and then over the next  several hours the pain abated and has not returned yet.  Labs were checked  in the emergency room and labs were found to be essentially completely  normal including normal cardiac enzymes, essentially unremarkable BMET and  CBC and EKG.  Blood pressure was 131/82 and came down to 122/68.  The  patient is doing well and she denies any headaches, vision changes,  dysphagia, chest pain, palpitations, shortness of breath, wheeze, cough,  abdominal pain, hematuria, dysuria, constipation, diarrhea, focal exteriorly  numbness, weakness or pain.  Review of systems is, otherwise negative.  PAST MEDICAL HISTORY:  The patient's past medical history includes  Parkinson's disease, depression, hypertension and hyperlipidemia.   MEDICATIONS:  The patient is on:  1.  Fluoxetine 20 p.o. daily.  2.  Sinemet 25/250 p.o. t.i.d.  3.  Caduet 5/20 p.o. daily.   ALLERGIES:  The patient is allergic to SULFA.   SOCIAL HISTORY:  She denies any tobacco, alcohol or drug use.   FAMILY HISTORY:  Noncontributory.   PHYSICAL EXAMINATION:  VITAL SIGNS:  On admission, temperature 97, heart  rate 87, blood pressure 131/82.  This has come down to 122/68, respirations  18, O2 sat 98% on 2 liters.  GENERAL:  The patient is alert and oriented times three in no apparent  distress.  HEENT:  Normocephalic, atraumatic.  Mucous membranes are moist.  She has no  carotid bruits.  HEART:  Regular rate and rhythm.  Small 2/6 systolic ejection murmur.  LUNGS:  Clear to  auscultation bilaterally.  ABDOMEN:  Soft, obese and nontender.  Positive bowel sounds.  EXTREMITIES:  No clubbing or cyanosis.  Trace pitting edema.   DATA:  PT 13.1, INR 1.  D-dimer 0.22.  CPK 41.4, MB 1.1, troponin I less  than 0.05 seconds, that is normal as well.  Sodium 138, potassium 4,  chloride 104, bicarb 28, BUN 15, creatinine 0.8, glucose 99.  EKG is not  present on the chart but according to the ER attending records it is normal  sinus rhythm with nonspecific T-wave changes.  No focal ST elevations are  reported.  I have asked for the patient's last cholesterol panel to be faxed  over.  Unfortunately, it is still pending.   ASSESSMENT AND PLAN:  1.  Chest pain,, check two more sets of cardiac enzymes and see cardiology      for consult for stress test in the morning.  Continue to follow serial      enzymes.  2.  Depression.  Continue fluoxetine.  3.  Parkinson's.  Continue Sinemet.  4.  Hyperlipidemia.  Continue Caduet.  5.  Benign positional vertigo.  Will put the patient on Antivert p.r.n.      Hollice Espy, M.D.  Electronically Signed     SKK/MEDQ  D:  12/13/2005  T:  12/13/2005  Job:  [id number]

## 2010-10-20 NOTE — Op Note (Signed)
[place]  Patient:    SERINE, KEA Visit Number: [id number] MRN: [id number]          Service Type: SUR Location: [id number] Attending Physician:  Jonne Ply Dictated by:   Stefani Dama, M.D. Proc. 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  OPERATION PERFORMED:  Anterior diskectomy and arthrodesis, C5-6; structural allograft, Synthes fixation.  SURGEON:  Stefani Dama, M.D.  ASSISTANT:  ANESTHESIA:  General endotracheal.  INDICATIONS FOR PROCEDURE:  The patient is a 70 year old individual who is known to our practice and for a long period of time, she has had neck, shoulder and arm pain for the past several months time and had an MRI performed January 14 which demonstrated spondylitic disease at C5-6 with a central disk herniation causing effacement and compromising both neural foramina.  The patient had primarily left cervical radicular symptoms.  The patient was taken to the operating room.  DESCRIPTION OF PROCEDURE:  The patient was brought to the operating room and placed on the table in supine position.  After smooth induction of general endotracheal anesthesia, she was placed in five pounds of halter traction. The neck was prepped and draped sterilely and a transverse incision was made on the left side of the neck.  The plane was carried down through the platysma and the plane between the sternocleidomastoid and strap muscles  was dissected bluntly until the prevertebral space was reached.  The first identifiable disk space was noted to be that of C5-6.  A diskectomy was then performed at C5-6 using a combination of curets and rongeurs,  first, however, osteophytes ventrally were removed with a large Leksell rongeur.  The diskectomy proceeded.  The posterior longitudinal ligament region was reached.  The self-retaining disk spreader was placed on the right side and left side was cleaned out with a series of curets.  A 2.3 mm high speed drill and dissecting tool was then used to remove a significant uncinate process osteophyte on the right side.  Then an osteophyte from the inferior margin of the body of C5 was similarly drilled down.  A  1 to 2 mm Kerrison punch were used to undercut the redundant and thickened yellow ligament and open up the lateral exit foramen. Once this was accomplished on the right side, hemostasis from some epidural veins was obtained with Gelfoam soaked in thrombin which was later irrigated away.  Similar dissection was carried out on the left side and here again a large lateral osteophyte from the uncinate region was removed and an inferior osteophyte from C5 was also found. Once the dissection was completed in this region, the interspace was then packed with a piece of 7 mm tricortical bone which was placed in reverse position with the tricortical surface facing dorsally.  Hemostasis in the soft tissues was then obtained.  Bones edges ventrally were trimmed flush and then an 18 mm standard size Synthes plate was affixed to the ventral aspect of the vertebral bodies with four locking  4 x 14 mm screws.  Hemostasis was then checked.  Radiographic localization was also checked and when found  to be quite adequate, the wound was irrigated copiously with antibiotic irrigating solution and the platysma was closed with 3-0 Vicryl suture in interrupted fashion.  3-0 Vicryl was used to close the subcuticular tissues.  The patient tolerated the procedure well. Dictated by:   Stefani Dama, M.D. Attending Physician:  Jonne Ply DD:  07/28/01 TD:  07/28/01 Job: [id number]

## 2010-12-26 ENCOUNTER — Ambulatory Visit: Payer: Medicare Other

## 2011-01-11 ENCOUNTER — Other Ambulatory Visit: Payer: Self-pay | Admitting: Neurological Surgery

## 2011-01-11 DIAGNOSIS — M47812 Spondylosis without myelopathy or radiculopathy, cervical region: Secondary | ICD-10-CM

## 2011-01-11 DIAGNOSIS — M542 Cervicalgia: Secondary | ICD-10-CM

## 2011-01-12 ENCOUNTER — Ambulatory Visit
Admission: RE | Admit: 2011-01-12 | Discharge: 2011-01-12 | Disposition: A | Discharge status: Home / Self Care | Payer: Medicare Other | Source: Ambulatory Visit | Attending: Neurological Surgery | Admitting: Neurological Surgery

## 2011-01-12 DIAGNOSIS — M542 Cervicalgia: Secondary | ICD-10-CM

## 2011-01-12 DIAGNOSIS — M47812 Spondylosis without myelopathy or radiculopathy, cervical region: Secondary | ICD-10-CM

## 2011-02-26 LAB — URINALYSIS, ROUTINE W REFLEX MICROSCOPIC
Glucose, UA: NEGATIVE
Ketones, ur: NEGATIVE
Nitrite: POSITIVE — AB
Specific Gravity, Urine: 1.005
pH: 6.5

## 2011-02-26 LAB — COMPREHENSIVE METABOLIC PANEL
ALT: <8
AST: 12
Albumin: 3.6
Alkaline Phosphatase: 76
BUN: 6
Chloride: 100
GFR calc Af Amer: >60
Potassium: 3.4 — ABNORMAL LOW
Sodium: 135
Total Bilirubin: 0.7
Total Protein: 6.9

## 2011-02-26 LAB — URINE MICROSCOPIC-ADD ON

## 2011-02-26 LAB — RAPID URINE DRUG SCREEN, HOSP PERFORMED
Cocaine: NOT DETECTED
Tetrahydrocannabinol: NOT DETECTED

## 2011-02-26 LAB — DIFFERENTIAL
Basophils Relative: 1
Eosinophils Relative: 2
Monocytes Absolute: 0.4
Monocytes Relative: 7
Neutro Abs: 2.6

## 2011-02-26 LAB — ACETAMINOPHEN LEVEL: Acetaminophen (Tylenol), Serum: 14.4

## 2011-02-26 LAB — URINE CULTURE

## 2011-02-26 LAB — CBC
Platelets: 334
RDW: 13.9
WBC: 5.2

## 2011-02-27 LAB — CBC
Hemoglobin: 11.6 — ABNORMAL LOW
Platelets: 279
RDW: 14.2
WBC: 7.3

## 2011-02-27 LAB — DIFFERENTIAL
Basophils Relative: 1
Eosinophils Absolute: 0.1
Eosinophils Relative: 2
Monocytes Absolute: 0.6
Monocytes Relative: 8

## 2011-02-27 LAB — URINALYSIS, ROUTINE W REFLEX MICROSCOPIC
Hgb urine dipstick: NEGATIVE
Nitrite: NEGATIVE
Protein, ur: NEGATIVE
Urobilinogen, UA: 1

## 2011-02-27 LAB — COMPREHENSIVE METABOLIC PANEL
ALT: <8
Albumin: 3.6
Alkaline Phosphatase: 89
Chloride: 102
Potassium: 3.5
Sodium: 139
Total Bilirubin: 0.7
Total Protein: 6.4

## 2011-02-27 LAB — URINE CULTURE: Colony Count: >=100000

## 2011-03-05 LAB — COMPREHENSIVE METABOLIC PANEL
ALT: <8
Calcium: 9.3
Glucose, Bld: 99
Sodium: 141
Total Protein: 6.8

## 2011-03-05 LAB — DIFFERENTIAL
Lymphs Abs: 1.6
Monocytes Relative: 8
Neutro Abs: 4.2
Neutrophils Relative %: 65

## 2011-03-05 LAB — RAPID URINE DRUG SCREEN, HOSP PERFORMED
Amphetamines: NOT DETECTED
Benzodiazepines: POSITIVE — AB
Cocaine: NOT DETECTED
Opiates: NOT DETECTED
Tetrahydrocannabinol: NOT DETECTED

## 2011-03-05 LAB — CBC
Hemoglobin: 12.3
MCHC: 32.3
RDW: 14

## 2011-03-05 LAB — ACETAMINOPHEN LEVEL: Acetaminophen (Tylenol), Serum: <10 — ABNORMAL LOW

## 2011-03-06 LAB — URINALYSIS, ROUTINE W REFLEX MICROSCOPIC
Bilirubin Urine: NEGATIVE
Ketones, ur: NEGATIVE
Nitrite: NEGATIVE
Urobilinogen, UA: 0.2

## 2011-08-14 ENCOUNTER — Encounter (HOSPITAL_BASED_OUTPATIENT_CLINIC_OR_DEPARTMENT_OTHER): Payer: Self-pay | Admitting: *Deleted

## 2011-08-14 NOTE — Progress Notes (Signed)
Pt instructed npo p mn 08/15/11 x neurontin, sinemet w/ sip of water.  To [facility] 3/14 @ 0600.  Needs istat.  ekg requested from Dr. Laverle Hobby office.

## 2011-08-15 ENCOUNTER — Other Ambulatory Visit: Payer: Self-pay | Admitting: Orthopedic Surgery

## 2011-08-15 NOTE — Anesthesia Preprocedure Evaluation (Addendum)
Anesthesia Evaluation  Patient identified by MRN, date of birth, ID band Patient awake    Reviewed: Allergy & Precautions, H&P , NPO status , Patient's Chart, lab work & pertinent test results  Airway Mallampati: II TM Distance: >3 FB Neck ROM: full    Dental No notable dental hx. (+) Caps,    Pulmonary neg pulmonary ROS,  breath sounds clear to auscultation  Pulmonary exam normal       Cardiovascular Exercise Tolerance: Good hypertension, Pt. on medications + dysrhythmias Supra Ventricular Tachycardia Rhythm:regular Rate:Normal  History tachycardia ? type   Neuro/Psych Depression S/p right CEA.  On Sinemet for restless leg syndrome. negative psych ROS   GI/Hepatic negative GI ROS, Neg liver ROS, GERD-  ,  Endo/Other  negative endocrine ROSMorbid obesity  Renal/GU negative Renal ROS  negative genitourinary   Musculoskeletal   Abdominal   Peds  Hematology negative hematology ROS (+)   Anesthesia Other Findings   Reproductive/Obstetrics negative OB ROS                          Anesthesia Physical Anesthesia Plan  ASA: III  Anesthesia Plan: General   Post-op Pain Management:    Induction: Intravenous  Airway Management Planned: LMA  Additional Equipment:   Intra-op Plan:   Post-operative Plan:   Informed Consent: I have reviewed the patients History and Physical, chart, labs and discussed the procedure including the risks, benefits and alternatives for the proposed anesthesia with the patient or authorized representative who has indicated his/her understanding and acceptance.   Dental Advisory Given  Plan Discussed with: CRNA and Surgeon  Anesthesia Plan Comments:         Anesthesia Quick Evaluation

## 2011-08-16 ENCOUNTER — Ambulatory Visit (HOSPITAL_BASED_OUTPATIENT_CLINIC_OR_DEPARTMENT_OTHER): Payer: Medicare Other | Admitting: Anesthesiology

## 2011-08-16 ENCOUNTER — Encounter (HOSPITAL_BASED_OUTPATIENT_CLINIC_OR_DEPARTMENT_OTHER): Payer: Self-pay | Admitting: Anesthesiology

## 2011-08-16 ENCOUNTER — Ambulatory Visit (HOSPITAL_BASED_OUTPATIENT_CLINIC_OR_DEPARTMENT_OTHER)
Admission: RE | Admit: 2011-08-16 | Discharge: 2011-08-16 | Disposition: A | Discharge status: Home / Self Care | Payer: Medicare Other | Source: Ambulatory Visit | Attending: Orthopedic Surgery | Admitting: Orthopedic Surgery

## 2011-08-16 ENCOUNTER — Encounter (HOSPITAL_BASED_OUTPATIENT_CLINIC_OR_DEPARTMENT_OTHER)
Admission: RE | Disposition: A | Discharge status: Home / Self Care | Payer: Self-pay | Source: Ambulatory Visit | Attending: Orthopedic Surgery

## 2011-08-16 ENCOUNTER — Encounter (HOSPITAL_BASED_OUTPATIENT_CLINIC_OR_DEPARTMENT_OTHER): Payer: Self-pay | Admitting: *Deleted

## 2011-08-16 DIAGNOSIS — Z96659 Presence of unspecified artificial knee joint: Secondary | ICD-10-CM | POA: Insufficient documentation

## 2011-08-16 DIAGNOSIS — Z9889 Other specified postprocedural states: Secondary | ICD-10-CM

## 2011-08-16 DIAGNOSIS — M23302 Other meniscus derangements, unspecified lateral meniscus, unspecified knee: Secondary | ICD-10-CM | POA: Insufficient documentation

## 2011-08-16 DIAGNOSIS — M23329 Other meniscus derangements, posterior horn of medial meniscus, unspecified knee: Secondary | ICD-10-CM | POA: Insufficient documentation

## 2011-08-16 DIAGNOSIS — I10 Essential (primary) hypertension: Secondary | ICD-10-CM | POA: Insufficient documentation

## 2011-08-16 DIAGNOSIS — M1710 Unilateral primary osteoarthritis, unspecified knee: Secondary | ICD-10-CM | POA: Insufficient documentation

## 2011-08-16 DIAGNOSIS — K219 Gastro-esophageal reflux disease without esophagitis: Secondary | ICD-10-CM | POA: Insufficient documentation

## 2011-08-16 DIAGNOSIS — M2340 Loose body in knee, unspecified knee: Secondary | ICD-10-CM | POA: Insufficient documentation

## 2011-08-16 HISTORY — DX: Nonspecific reaction to tuberculin skin test without active tuberculosis: R76.11

## 2011-08-16 HISTORY — DX: Depression, unspecified: F32.A

## 2011-08-16 HISTORY — DX: Gastro-esophageal reflux disease without esophagitis: K21.9

## 2011-08-16 HISTORY — DX: Restless legs syndrome: G25.81

## 2011-08-16 HISTORY — DX: Major depressive disorder, single episode, unspecified: F32.9

## 2011-08-16 HISTORY — DX: Essential (primary) hypertension: I10

## 2011-08-16 HISTORY — DX: Unspecified osteoarthritis, unspecified site: M19.90

## 2011-08-16 LAB — POCT I-STAT 4, (NA,K, GLUC, HGB,HCT)
Glucose, Bld: 146 mg/dL — ABNORMAL HIGH (ref 70–99)
HCT: 44 % (ref 36.0–46.0)
Hemoglobin: 15 g/dL (ref 12.0–15.0)
Potassium: 3.7 mEq/L (ref 3.5–5.1)
Sodium: 138 mEq/L (ref 135–145)

## 2011-08-16 SURGERY — ARTHROSCOPY, KNEE, WITH MEDIAL MENISCECTOMY
Anesthesia: General | Site: Knee | Laterality: Right | Wound class: Clean

## 2011-08-16 MED ORDER — SODIUM CHLORIDE 0.9 % IR SOLN
Status: DC | PRN
  Administered 2011-08-16: 08:00:00

## 2011-08-16 MED ORDER — FENTANYL CITRATE 0.05 MG/ML IJ SOLN
25.0000 ug | INTRAMUSCULAR | Status: DC | PRN
  Administered 2011-08-16 (×3): 25 ug via INTRAVENOUS

## 2011-08-16 MED ORDER — BUPIVACAINE-EPINEPHRINE 0.5% -1:200000 IJ SOLN
INTRAMUSCULAR | Status: DC | PRN
  Administered 2011-08-16: 20 mL

## 2011-08-16 MED ORDER — VANCOMYCIN HCL 1000 MG IV SOLR
1000.0000 mg | INTRAVENOUS | Status: DC | PRN
  Administered 2011-08-16: 1000 mg via INTRAVENOUS

## 2011-08-16 MED ORDER — PROPOFOL 10 MG/ML IV EMUL
INTRAVENOUS | Status: DC | PRN
  Administered 2011-08-16: 200 mg via INTRAVENOUS

## 2011-08-16 MED ORDER — LIDOCAINE HCL (CARDIAC) 20 MG/ML IV SOLN
INTRAVENOUS | Status: DC | PRN
  Administered 2011-08-16: 80 mg via INTRAVENOUS

## 2011-08-16 MED ORDER — FENTANYL CITRATE 0.05 MG/ML IJ SOLN
INTRAMUSCULAR | Status: DC | PRN
  Administered 2011-08-16: 50 ug via INTRAVENOUS
  Administered 2011-08-16: 100 ug via INTRAVENOUS

## 2011-08-16 MED ORDER — KETOROLAC TROMETHAMINE 30 MG/ML IJ SOLN
INTRAMUSCULAR | Status: DC | PRN
  Administered 2011-08-16: 15 mg via INTRAVENOUS

## 2011-08-16 MED ORDER — DEXAMETHASONE SODIUM PHOSPHATE 4 MG/ML IJ SOLN
INTRAMUSCULAR | Status: DC | PRN
  Administered 2011-08-16: 10 mg via INTRAVENOUS

## 2011-08-16 MED ORDER — SODIUM CHLORIDE 0.9 % IR SOLN
Status: DC | PRN
  Administered 2011-08-16: 3000 mL

## 2011-08-16 MED ORDER — ONDANSETRON HCL 4 MG/2ML IJ SOLN
INTRAMUSCULAR | Status: DC | PRN
  Administered 2011-08-16: 4 mg via INTRAVENOUS

## 2011-08-16 MED ORDER — ACETAMINOPHEN 10 MG/ML IV SOLN
1000.0000 mg | Freq: Four times a day (QID) | INTRAVENOUS | Status: DC
  Administered 2011-08-16: 1000 mg via INTRAVENOUS

## 2011-08-16 MED ORDER — OXYCODONE HCL 5 MG PO TABS
5.0000 mg | ORAL_TABLET | ORAL | Status: DC | PRN
  Administered 2011-08-16: 2.5 mg via ORAL

## 2011-08-16 MED ORDER — OXYCODONE-ACETAMINOPHEN 7.5-325 MG PO TABS: 1.0000 | ORAL_TABLET | ORAL | Status: AC | PRN

## 2011-08-16 MED ORDER — LACTATED RINGERS IV SOLN: INTRAVENOUS | Status: DC

## 2011-08-16 MED ORDER — MORPHINE SULFATE 4 MG/ML IJ SOLN
INTRAMUSCULAR | Status: DC | PRN
  Administered 2011-08-16: 4 mg via INTRAVENOUS

## 2011-08-16 MED ORDER — OXYCODONE-ACETAMINOPHEN 5-325 MG PO TABS
1.0000 | ORAL_TABLET | ORAL | Status: DC | PRN
  Administered 2011-08-16: 1 via ORAL

## 2011-08-16 MED ORDER — METHOCARBAMOL 500 MG PO TABS
500.0000 mg | ORAL_TABLET | Freq: Three times a day (TID) | ORAL | Status: AC
  Administered 2011-08-16: 500 mg via ORAL

## 2011-08-16 MED ORDER — POVIDONE-IODINE 7.5 % EX SOLN: Freq: Once | CUTANEOUS | Status: DC

## 2011-08-16 MED ORDER — LACTATED RINGERS IV SOLN
INTRAVENOUS | Status: DC
  Administered 2011-08-16: 07:00:00 via INTRAVENOUS

## 2011-08-16 SURGICAL SUPPLY — 57 items
BANDAGE ELASTIC 6 VELCRO ST LF (GAUZE/BANDAGES/DRESSINGS) ×4 IMPLANT
BANDAGE ESMARK 6X9 LF (GAUZE/BANDAGES/DRESSINGS) ×2 IMPLANT
BANDAGE GAUZE ELAST BULKY 4 IN (GAUZE/BANDAGES/DRESSINGS) ×3 IMPLANT
BLADE 4.2CUDA (BLADE) IMPLANT
BLADE CUDA 5.5 (BLADE) IMPLANT
BLADE CUDA SHAVER 3.5 (BLADE) ×3 IMPLANT
BLADE CUTTER GATOR 3.5 (BLADE) IMPLANT
BLADE GREAT WHITE 4.2 (BLADE) IMPLANT
BNDG CMPR 9X6 STRL LF SNTH (GAUZE/BANDAGES/DRESSINGS) ×2
BNDG ESMARK 6X9 LF (GAUZE/BANDAGES/DRESSINGS) ×3
CANISTER SUCT LVC 12 LTR MEDI- (MISCELLANEOUS) ×4 IMPLANT
CANISTER SUCTION 1200CC (MISCELLANEOUS) ×1 IMPLANT
CANISTER SUCTION 2500CC (MISCELLANEOUS) ×2 IMPLANT
CLOTH BEACON ORANGE TIMEOUT ST (SAFETY) ×3 IMPLANT
DRAPE ARTHROSCOPY W/POUCH 114 (DRAPES) ×3 IMPLANT
DRAPE LG THREE QUARTER DISP (DRAPES) ×3 IMPLANT
DRSG EMULSION OIL 3X3 NADH (GAUZE/BANDAGES/DRESSINGS) ×3 IMPLANT
DRSG PAD ABDOMINAL 8X10 ST (GAUZE/BANDAGES/DRESSINGS) ×2 IMPLANT
DURAPREP 26ML APPLICATOR (WOUND CARE) ×3 IMPLANT
ELECT MENISCUS 165MM 90D (ELECTRODE) IMPLANT
ELECT REM PT RETURN 9FT ADLT (ELECTROSURGICAL)
ELECTRODE REM PT RTRN 9FT ADLT (ELECTROSURGICAL) IMPLANT
GLOVE BIOGEL M 6.5 STRL (GLOVE) ×2 IMPLANT
GLOVE BIOGEL PI IND STRL 7.0 (GLOVE) ×1 IMPLANT
GLOVE BIOGEL PI IND STRL 8 (GLOVE) ×2 IMPLANT
GLOVE BIOGEL PI INDICATOR 7.0 (GLOVE) ×1
GLOVE BIOGEL PI INDICATOR 8 (GLOVE) ×1
GLOVE ECLIPSE 6.0 STRL STRAW (GLOVE) ×2 IMPLANT
GLOVE ECLIPSE 8.0 STRL XLNG CF (GLOVE) ×4 IMPLANT
GLOVE INDICATOR 8.0 STRL GRN (GLOVE) ×6 IMPLANT
GLOVE SURG SS PI 6.0 STRL IVOR (GLOVE) ×2 IMPLANT
GOWN PREVENTION PLUS LG XLONG (DISPOSABLE) ×5 IMPLANT
GOWN STRL REIN XL XLG (GOWN DISPOSABLE) ×3 IMPLANT
IV NS IRRIG 3000ML ARTHROMATIC (IV SOLUTION) ×8 IMPLANT
KNEE WRAP E Z 3 GEL PACK (MISCELLANEOUS) ×3 IMPLANT
NDL HYPO 18GX1.5 BLUNT FILL (NEEDLE) ×1 IMPLANT
NDL SAFETY ECLIPSE 18X1.5 (NEEDLE) ×2 IMPLANT
NEEDLE HYPO 18GX1.5 BLUNT FILL (NEEDLE) ×3 IMPLANT
NEEDLE HYPO 18GX1.5 SHARP (NEEDLE) ×3
PACK ARTHROSCOPY DSU (CUSTOM PROCEDURE TRAY) ×3 IMPLANT
PACK BASIN DAY SURGERY FS (CUSTOM PROCEDURE TRAY) ×3 IMPLANT
PADDING CAST ABS 4INX4YD NS (CAST SUPPLIES) ×2
PADDING CAST ABS COTTON 4X4 ST (CAST SUPPLIES) ×3 IMPLANT
PADDING CAST COTTON 6X4 STRL (CAST SUPPLIES) ×2 IMPLANT
PENCIL BUTTON HOLSTER BLD 10FT (ELECTRODE) IMPLANT
SET ARTHROSCOPY TUBING (MISCELLANEOUS) ×3
SET ARTHROSCOPY TUBING LN (MISCELLANEOUS) ×2 IMPLANT
SPONGE GAUZE 4X4 12PLY (GAUZE/BANDAGES/DRESSINGS) ×3 IMPLANT
SUT ETHIBOND 2 OS 4 DA (SUTURE) IMPLANT
SUT ETHILON 4 0 PS 2 18 (SUTURE) ×3 IMPLANT
SUT VIC AB 0 CT1 36 (SUTURE) IMPLANT
SUT VIC AB 2-0 PS2 27 (SUTURE) IMPLANT
SYRINGE 10CC LL (SYRINGE) ×3 IMPLANT
TOWEL NATURAL 6PK STERILE (DISPOSABLE) ×4 IMPLANT
TOWEL OR 17X24 6PK STRL BLUE (TOWEL DISPOSABLE) ×3 IMPLANT
WAND 90 DEG TURBOVAC W/CORD (SURGICAL WAND) IMPLANT
WATER STERILE IRR 500ML POUR (IV SOLUTION) ×3 IMPLANT

## 2011-08-16 NOTE — Progress Notes (Signed)
Dr Simonne Come notified of pt's cont pain " 7 "after IV fentanyl & percocet po given. New order noted to give tylenol 1 Gm IV now.

## 2011-08-16 NOTE — H&P (Signed)
[name] is an 71 y.o. female.   Chief Complaint:painful rt knee HPImri demonstrates torn medial meniscus  Past Medical History  Diagnosis Date  . Hypertension   . Headache     remote hx of migraines  . GERD (gastroesophageal reflux disease)     hx pud '98  . Neuromuscular disorder     peripheral neuropathy  . Depression     bh admission '09  . Dysrhythmia     hx tachy palpitations  . Arthritis     djd  . Restless leg syndrome   . PPD positive, treated 1987    tx'd x 1 yr w/ inh    Past Surgical History  Procedure Date  . Joint replacement '01    total knee replacement  . Back surgery     laminectomy x3  . Cervical disc surgery x2   . Rt carotid enarterectomy 2001  . Rt knee arthroscopy '99  . Left knee arthroscopy 2001  . Cholecystectomy 1993  . Abdominal hysterectomy 1975    bil oophorectomy  . Hematoma evacuation 2011    s/p rt cea    History reviewed. No pertinent family history. Social History:  reports that she has never smoked. She does not have any smokeless tobacco history on file. She reports that she does not drink alcohol. Her drug history not on file.  Allergies:  Allergies  Allergen Reactions  . Penicillins Itching  . Sulfa Drugs Cross Reactors Itching    Medications Prior to Admission  Medication Dose Route Frequency Provider Last Rate Last Dose  . fentaNYL (SUBLIMAZE) injection 25-50 mcg  25-50 mcg Intravenous Q5 min PRN Gaetano Hawthorne, MD      . lactated ringers infusion   Intravenous Continuous Gaetano Hawthorne, MD      . lactated ringers infusion   Intravenous Continuous Gaetano Hawthorne, MD 125 mL/hr at 08/16/11 0645    . povidone-iodine (BETADINE) 7.5 % scrub   Topical Once Drucilla Schmidt, MD       No current outpatient prescriptions on file as of 08/16/2011.    Results for orders placed during the hospital encounter of 08/16/11 (from the past 48 hour(s))  POCT I-STAT 4, (NA,K, GLUC, HGB,HCT)     Status: Abnormal   Collection  Time   08/16/11  6:53 AM      Component Value Range Comment   Sodium 138  135 - 145 (mEq/L)    Potassium 3.7  3.5 - 5.1 (mEq/L)    Glucose, Bld 146 (*) 70 - 99 (mg/dL)    HCT 09.6  04.5 - 40.9 (%)    Hemoglobin 15.0  12.0 - 15.0 (g/dL)    No results found.  ROS  Blood pressure 125/71, pulse 97, temperature 97.8 F (36.6 C), temperature source Oral, resp. rate 18, height 5\' 5"  (1.651 m), weight 88.451 kg (195 lb), SpO2 95.00%. Physical Exam  Constitutional: [name] oriented to person, place, and time. She appears well-developed and well-nourished.  HENT:  Head: Normocephalic and atraumatic.  Right Ear: External ear normal.  Left Ear: External ear normal.  Nose: Nose normal.  Mouth/Throat: Oropharynx is clear and moist.  Eyes: Conjunctivae and EOM are normal. Pupils are equal, round, and reactive to light.  Neck: Normal range of motion. Neck supple.  Cardiovascular: Normal rate, regular rhythm, normal heart sounds and intact distal pulses.   Respiratory: Effort normal and breath sounds normal.  GI: Soft. Bowel sounds are normal.  Musculoskeletal: Normal range  of motion. She exhibits tenderness.       Painful to extend rt knee;tender medial jt line  Neurological: [name] alert and oriented to person, place, and time. She has normal reflexes.  Skin: Skin is warm and dry.  Psychiatric: She has a normal mood and affect. Her behavior is normal. Judgment and thought content normal.     Assessment/Plan Torn medial meniscus rt knee Rt knee arthroscopy with partial medial meniscectomy Tayven Renteria P 08/16/2011, 7:25 AM

## 2011-08-16 NOTE — Anesthesia Procedure Notes (Signed)
Procedure Name: LMA Insertion Performed by: Briant Sites Pre-anesthesia Checklist: Patient identified, Emergency Drugs available, Suction available and Patient being monitored Patient Re-evaluated:Patient Re-evaluated prior to inductionOxygen Delivery Method: Circle System Utilized Preoxygenation: Pre-oxygenation with 100% oxygen Intubation Type: IV induction Ventilation: Mask ventilation without difficulty LMA: LMA inserted LMA Size: 4.0 Number of attempts: 1 Placement Confirmation: positive ETCO2 Dental Injury: Teeth and Oropharynx as per pre-operative assessment  Comments: Slight leak with positive pressure ventilation, good volumes with self initiated breath.

## 2011-08-16 NOTE — Brief Op Note (Signed)
08/16/2011  9:00 AM  PATIENT:  [name]  71 y.o. female  PRE-OPERATIVE DIAGNOSIS:  RIGHT KNEE TORN MEDIAL MENISCUS and osteoarthritis  POST-OPERATIVE DIAGNOSIS:  RIGHT KNEE TORN MEDIAL MENISCUS,torn lateral meniscus, osteoarthritis with loose body  PROCEDURE:  Procedure(s) (LRB): KNEE ARTHROSCOPY WITH partial medial and lateral meniscectomies`,removal loose body  SURGEON:  Surgeon(s) and Role:    * Drucilla Schmidt, MD - Primary  PHYSICIAN ASSISTANT:   ASSISTANTSnurse   ANESTHESIA:   general  EBL:  Total I/O In: 100 [I.V.:100] Out: -   BLOOD ADMINISTERED:none  DRAINS: none   LOCAL MEDICATIONS USED:  MARCAINE     SPECIMEN:  No Specimen  DISPOSITION OF SPECIMEN:  N/A  COUNTS:  YES  TOURNIQUET:   Total Tourniquet Time Documented: Thigh (Right) - 59 minutes  DICTATION: .Other Dictation: Dictation Number [id number]  PLAN OF CARE: Discharge to home after PACU  PATIENT DISPOSITION:  PACU - hemodynamically stable.   Delay start of Pharmacological VTE agent (>24hrs) due to surgical blood loss or risk of bleeding: not applicable

## 2011-08-16 NOTE — Transfer of Care (Signed)
Immediate Anesthesia Transfer of Care Note  Patient: [name]  Procedure(s) Performed: Procedure(s) (LRB): KNEE ARTHROSCOPY WITH MEDIAL MENISECTOMY (Right)  Patient Location: PACU  Anesthesia Type: General  Level of Consciousness: sedated and responds to stimulation  Airway & Oxygen Therapy: Patient Spontanous Breathing and Patient connected to nasal cannula oxygen  Post-op Assessment: Report given to PACU RN  Post vital signs: Reviewed and stable  Complications: No apparent anesthesia complications

## 2011-08-16 NOTE — Anesthesia Postprocedure Evaluation (Signed)
  Anesthesia Post-op Note  Patient: [name]  Procedure(s) Performed: Procedure(s) (LRB): KNEE ARTHROSCOPY WITH MEDIAL MENISECTOMY (Right)  Patient Location: PACU  Anesthesia Type: General  Level of Consciousness: awake and alert   Airway and Oxygen Therapy: Patient Spontanous Breathing  Post-op Pain: mild  Post-op Assessment: Post-op Vital signs reviewed, Patient's Cardiovascular Status Stable, Respiratory Function Stable, Patent Airway and No signs of Nausea or vomiting  Post-op Vital Signs: stable  Complications: No apparent anesthesia complications

## 2011-08-17 NOTE — Op Note (Signed)
[name], HUMES                 ACCOUNT NO.:  [id number]  MEDICAL RECORD NO.:  [id number]  LOCATION:                               FACILITY:  [facility]  PHYSICIAN:  Marlowe Kays, M.D.  DATE OF BIRTH:  1941/03/01  DATE OF PROCEDURE:  08/16/2011 DATE OF DISCHARGE:                              OPERATIVE REPORT   PREOPERATIVE DIAGNOSES: 1. Torn medial meniscus. 2. Osteoarthritis, right knee.  POSTOPERATIVE DIAGNOSES: 1. Torn medial lateral menisci. 2. Osteoarthritis. 3. Loose body, right knee.  OPERATION:  Right knee arthroscopy with: 1. Partial medial lateral meniscectomies. 2. Removal of loose osteochondral fragments.  SURGEON:  Marlowe Kays, M.D.  ASSISTANT:  Nurse.  ANESTHESIA:  General.  PATHOLOGY AND JUSTIFICATION FOR PROCEDURE:  She has had the sudden onset of right knee pain and has had pain even trying to extend her knee.  An MRI has demonstrated advanced osteoarthritis with a complex tear of the posterior portion of the medial meniscus.  At surgery, also found a loose fragment as noted above.  DESCRIPTION OF PROCEDURE:  Prophylactic antibiotics due to left total knee replacement, satisfied general anesthesia, pneumatic tourniquet with the left leg Esmarch done nonsterilely and tourniquet inflated to 350 mmHg.  Ace wrap and knee support to left lower extremity.  Thigh stabilizer applied to right lower extremity and leg prepped with DuraPrep from stabilizer to ankle, draped in sterile field.  Time-out performed.  Superior medial saline inflow.  First an anteromedial portal lateral compartment knee joint was evaluated.  It was quite inflamed.  I found a fairly good-sized loose fragment, which seemed to be more articular cartilage adjacent to the ACL, which I removed with a pituitary rongeur.  She had a good bit of fraying of the inner border of the lateral meniscus, which I shaved down until smooth with the 3.5 shaver.  I was unable to look up in the lateral  gutter because of her anatomy.  I then reversed portals.  In the medial joint, she had almost a complete wear of both joint surfaces.  She had a comminuted tear of the posterior 40% of the medial meniscus, which I trimmed back to stable rim with a combination of the 3.5 shaver and baskets.  Looking up in the suprapatellar area, she had a good bit of fibrous tissue, and I was really unable to get a good look at the patella because of her pathology.  This was not her symptomatic area.  So I felt that this was not essential.  Knee joint was then irrigated to clear all fluid possible removed.  I closed the anterior portals with 4-0 nylon and then injected 20 mL 0.5% Marcaine with adrenaline and 4 mg of morphine through the inflow apparatus, which I then closed with 4-0 nylon as well.  Betadine, Adaptic, dry sterile dressing were applied.  Tourniquet was released. She tolerated the procedure well.  At the time of this dictation, she was on her way to the recovery room in satisfactory condition with no known complication.          ______________________________ Marlowe Kays, M.D.     JA/MEDQ  D:  08/16/2011  T:  08/16/2011  Job:  [id number]

## 2011-08-21 NOTE — Addendum Note (Signed)
Addendum  created 08/21/11 1136 by Cordarryl Monrreal M Cleburne Savini, CRNA   Modules edited:Anesthesia Events    

## 2011-08-21 NOTE — Addendum Note (Signed)
Addendum  created 08/21/11 1136 by Fran Lowes, CRNA   Modules edited:Anesthesia Events

## 2011-08-29 ENCOUNTER — Encounter (HOSPITAL_BASED_OUTPATIENT_CLINIC_OR_DEPARTMENT_OTHER): Payer: Self-pay

## 2011-09-04 ENCOUNTER — Other Ambulatory Visit: Payer: Self-pay | Admitting: Orthopedic Surgery

## 2011-09-04 ENCOUNTER — Encounter (HOSPITAL_COMMUNITY): Payer: Self-pay | Admitting: Pharmacy Technician

## 2011-09-04 NOTE — Progress Notes (Signed)
09/03/11   1700-Called patient to schedule appt for pre surgical testing and lab work. Does not want to come until day of surgery and feels this can be handled over the phone. Informed her we will have to discuss this with Dr Simonne Come.  09/04/11 0945 Spoke with Parke Poisson, schedular at [facility] who will discuss this with MD. 1430 Received voice mail message from [place] stating "SAME DAY LABS OK WITH DR Simonne Come'.  She stated to call her for any problems

## 2011-09-05 ENCOUNTER — Encounter (HOSPITAL_COMMUNITY): Payer: Self-pay | Admitting: *Deleted

## 2011-09-05 NOTE — Pre-Procedure Instructions (Addendum)
EKG 9-12 2012 DR Pete Glatter ON CHART CHEST 2 VIEW XRAY 02-27-2011 DR Pete Glatter ON CHART PT INSTRUCTED HIBICLENS SHOWER HS AND AM OF SURGERY

## 2011-09-07 NOTE — H&P (Signed)
NAMEGLENDOLA, [name] NO.:  [id number]  MEDICAL RECORD NO.:  [id number]  LOCATION:  PERIO                        FACILITY:  [facility]  PHYSICIAN:  Marlowe Kays, M.D.  DATE OF BIRTH:  [birth year]  DATE OF ADMISSION:  09/13/2011 DATE OF DISCHARGE:                             HISTORY & PHYSICAL   CHIEF COMPLAINT:  Pain in my right knee.  HISTORY OF PRESENT ILLNESS:  This is a 71 year old white female who has been seen by [name] for continued progressive problems with pain into her right knee.  In the past, she has successfully undergone left total knee replacement with arthroplasty.  We have been treating this right knee with injections, cortisones, rest, exercises, and unfortunately it is now markedly interfering with her day-to-day activities where she must use a walker just to get about.  [name] a retired Doctor, general practice and she is used to a very active lifestyle, but this is markedly interfering with her day-to-day activities.  After much discussion, including the risks and benefits of surgery, it was decided to go ahead with total knee replacement arthroplasty to the right knee.  PAST MEDICAL HISTORY:  This patient has been in relatively good health throughout her lifetime.  ALLERGIC:  [name] allergic to sulfa and penicillins, which causes itching.  SOCIAL HISTORY:  The patient has 2 children.  No intake of alcohol or tobacco products.  CURRENT MEDICATIONS: 1. Tramadol 50 mg tablet 1-2 q.4-6. 2. Percocet 5/325 tablet 1 q.4-6 hours p.r.n. pain. 3. Lipitor 20 mg tablet. 4. Neurontin 1800 mg tablet. 5. Prozac 400 mg capsule. 6. Sinemet 25-250 mg tablet. 7. Sinemet CR 50-200 mg tablet in the ER. 8. Norvasc 10 mg tablet. 9. Aspirin low-dose 21 mg daily. 10.Remeron 15 mg at bedtime. 11.Calcium plus vitamin D and vitamin B6 daily.  PAST SURGICAL HISTORY:  Includes hysterectomy at age 78, cholecystectomy at 72 years of age.  She has had 3 lumbar  laminectomies, two cervical disks removed.  Total knee replacement in 2001 to the left knee, and right knee arthroscopy in March of this year.  FAMILY HISTORY:  Positive for heart disease in her father, Alzheimer's in her mother.  Sister with congestive heart failure.  REVIEW OF SYSTEMS:  CNS:  No seizures or paralysis, numbness, or double vision.  RESPIRATORY:  No productive cough.  No hemoptysis.  No shortness of breath.  CARDIOVASCULAR:  No chest pain.  No angina.  No orthopnea.  She states she does occasional PACs.  GASTROINTESTINAL:  She has reflux problems, but no nausea, vomiting, melena or bloody stool. GENITOURINARY:  No discharge or hematuria. MUSCULOSKELETAL:  Primarily in her right knee, with crepitus and painful range of motion.  PHYSICAL EXAMINATION:  GENERAL:  Alert and cooperative, friendly 71 year old white female, who is slightly obese. VITAL SIGNS:  Blood pressure 144/93, pulse 78 regular, respirations 12 unlabored. HEENT:  Normocephalic.  PERRLA.  EOM intact.  Oropharynx is clear. CHEST:  Clear to auscultation.  No rhonchi, no rales. HEART:  Regular rate and rhythm.  No murmurs are heard. ABDOMEN:  Soft, nontender, spleen not felt. GENITALIA/RECTAL/PELVIC/BREASTS:  Not done.  Not pertinent to present illness. EXTREMITIES:  As  in present illness above with crepitus and range of motion of right knee.  ADMITTING DIAGNOSES: 1. End-stage osteoarthritis with bone-on-bone deformity by x-ray. 2. Hypertension. 3. Gastroesophageal reflux disease. 4. Hypercholesterolemia.  PLAN:  The patient will undergo total knee replacement arthroplasty of the right knee.  Plan to have her home after her regular hospitalization for home physical therapy and help.     [name].   ______________________________ Marlowe Kays, M.D.    DLU/MEDQ  D:  09/06/2011  T:  09/07/2011  Job:  [id number]  cc:   Hal T. Stoneking, M.D. Fax: [phone]

## 2011-09-13 ENCOUNTER — Ambulatory Visit (HOSPITAL_COMMUNITY): Payer: Medicare Other

## 2011-09-13 ENCOUNTER — Encounter (HOSPITAL_COMMUNITY): Payer: Self-pay | Admitting: Anesthesiology

## 2011-09-13 ENCOUNTER — Ambulatory Visit (HOSPITAL_COMMUNITY): Payer: Medicare Other | Admitting: Anesthesiology

## 2011-09-13 ENCOUNTER — Encounter (HOSPITAL_COMMUNITY)
Admission: RE | Disposition: A | Discharge status: Home Health | Payer: Self-pay | Source: Ambulatory Visit | Attending: Orthopedic Surgery

## 2011-09-13 ENCOUNTER — Encounter (HOSPITAL_COMMUNITY): Payer: Self-pay | Admitting: *Deleted

## 2011-09-13 ENCOUNTER — Inpatient Hospital Stay (HOSPITAL_COMMUNITY)
Admission: RE | Admit: 2011-09-13 | Discharge: 2011-09-18 | DRG: 470 | Disposition: A | Discharge status: Home Health | Payer: Medicare Other | Source: Ambulatory Visit | Attending: Orthopedic Surgery | Admitting: Orthopedic Surgery

## 2011-09-13 DIAGNOSIS — D649 Anemia, unspecified: Secondary | ICD-10-CM | POA: Diagnosis not present

## 2011-09-13 DIAGNOSIS — M1710 Unilateral primary osteoarthritis, unspecified knee: Principal | ICD-10-CM | POA: Diagnosis present

## 2011-09-13 DIAGNOSIS — K219 Gastro-esophageal reflux disease without esophagitis: Secondary | ICD-10-CM | POA: Diagnosis present

## 2011-09-13 DIAGNOSIS — E7800 Pure hypercholesterolemia, unspecified: Secondary | ICD-10-CM | POA: Diagnosis present

## 2011-09-13 DIAGNOSIS — E669 Obesity, unspecified: Secondary | ICD-10-CM | POA: Diagnosis present

## 2011-09-13 DIAGNOSIS — I10 Essential (primary) hypertension: Secondary | ICD-10-CM | POA: Diagnosis present

## 2011-09-13 DIAGNOSIS — Z96659 Presence of unspecified artificial knee joint: Secondary | ICD-10-CM

## 2011-09-13 HISTORY — PX: TOTAL KNEE ARTHROPLASTY: SHX125

## 2011-09-13 LAB — CBC
HCT: 39.3 % (ref 36.0–46.0)
Platelets: 375 10*3/uL (ref 150–400)
RBC: 3.87 MIL/uL (ref 3.87–5.11)
RDW: 14 % (ref 11.5–15.5)
WBC: 7.1 10*3/uL (ref 4.0–10.5)

## 2011-09-13 LAB — DIFFERENTIAL
Basophils Absolute: 0.1 10*3/uL (ref 0.0–0.1)
Lymphocytes Relative: 33 % (ref 12–46)
Lymphs Abs: 2.3 10*3/uL (ref 0.7–4.0)
Neutro Abs: 3.9 10*3/uL (ref 1.7–7.7)

## 2011-09-13 LAB — SURGICAL PCR SCREEN: MRSA, PCR: NEGATIVE

## 2011-09-13 LAB — BASIC METABOLIC PANEL
BUN: 13 mg/dL (ref 6–23)
Creatinine, Ser: 0.44 mg/dL — ABNORMAL LOW (ref 0.50–1.10)
GFR calc non Af Amer: >90 mL/min (ref >90)
Glucose, Bld: 125 mg/dL — ABNORMAL HIGH (ref 70–99)
Potassium: 3.9 mEq/L (ref 3.5–5.1)

## 2011-09-13 LAB — PROTIME-INR: INR: 0.96 (ref 0.00–1.49)

## 2011-09-13 SURGERY — ARTHROPLASTY, KNEE, TOTAL
Anesthesia: General | Site: Knee | Laterality: Right | Wound class: Clean

## 2011-09-13 MED ORDER — OXYCODONE-ACETAMINOPHEN 5-325 MG PO TABS
1.0000 | ORAL_TABLET | ORAL | Status: AC | PRN
  Administered 2011-09-14 (×2): 1 via ORAL
  Filled 2011-09-13 (×2): qty 1

## 2011-09-13 MED ORDER — ONDANSETRON HCL 4 MG/2ML IJ SOLN: 4.0000 mg | Freq: Four times a day (QID) | INTRAMUSCULAR | Status: DC | PRN

## 2011-09-13 MED ORDER — METHOCARBAMOL 100 MG/ML IJ SOLN
500.0000 mg | Freq: Four times a day (QID) | INTRAMUSCULAR | Status: AC | PRN
  Administered 2011-09-14 – 2011-09-15 (×2): 500 mg via INTRAVENOUS
  Filled 2011-09-13 (×3): qty 5

## 2011-09-13 MED ORDER — HYDROMORPHONE HCL PF 1 MG/ML IJ SOLN
INTRAMUSCULAR | Status: AC
  Administered 2011-09-14: 1 mg via INTRAVENOUS
  Filled 2011-09-13: qty 2

## 2011-09-13 MED ORDER — FLUOXETINE HCL 20 MG PO CAPS
40.0000 mg | ORAL_CAPSULE | Freq: Every day | ORAL | Status: DC
  Administered 2011-09-13 – 2011-09-17 (×5): 40 mg via ORAL
  Filled 2011-09-13 (×6): qty 2

## 2011-09-13 MED ORDER — HYDROMORPHONE 0.3 MG/ML IV SOLN
INTRAVENOUS | Status: AC
  Administered 2011-09-13: 22:00:00
  Filled 2011-09-13: qty 25

## 2011-09-13 MED ORDER — CARBIDOPA-LEVODOPA ER 50-200 MG PO TBCR
1.0000 | EXTENDED_RELEASE_TABLET | Freq: Every day | ORAL | Status: DC
  Administered 2011-09-13 – 2011-09-17 (×5): 1 via ORAL
  Filled 2011-09-13 (×6): qty 1

## 2011-09-13 MED ORDER — SIMVASTATIN 40 MG PO TABS
40.0000 mg | ORAL_TABLET | Freq: Every day | ORAL | Status: DC
  Filled 2011-09-13 (×2): qty 1

## 2011-09-13 MED ORDER — SUCCINYLCHOLINE CHLORIDE 20 MG/ML IJ SOLN
INTRAMUSCULAR | Status: DC | PRN
  Administered 2011-09-13: 80 mg via INTRAVENOUS

## 2011-09-13 MED ORDER — ACETAMINOPHEN 500 MG PO TABS
500.0000 mg | ORAL_TABLET | Freq: Four times a day (QID) | ORAL | Status: DC | PRN
  Administered 2011-09-15 – 2011-09-17 (×4): 500 mg via ORAL
  Filled 2011-09-13: qty 1
  Filled 2011-09-13: qty 2
  Filled 2011-09-13 (×2): qty 1

## 2011-09-13 MED ORDER — FENTANYL CITRATE 0.05 MG/ML IJ SOLN
INTRAMUSCULAR | Status: DC | PRN
  Administered 2011-09-13: 100 ug via INTRAVENOUS

## 2011-09-13 MED ORDER — CEFAZOLIN SODIUM-DEXTROSE 2-3 GM-% IV SOLR
2.0000 g | INTRAVENOUS | Status: AC
  Administered 2011-09-13: 2 g via INTRAVENOUS

## 2011-09-13 MED ORDER — LIDOCAINE HCL (CARDIAC) 20 MG/ML IV SOLN
INTRAVENOUS | Status: DC | PRN
  Administered 2011-09-13: 100 mg via INTRAVENOUS

## 2011-09-13 MED ORDER — CEFAZOLIN SODIUM-DEXTROSE 2-3 GM-% IV SOLR
INTRAVENOUS | Status: AC
  Filled 2011-09-13: qty 50

## 2011-09-13 MED ORDER — SUFENTANIL CITRATE 50 MCG/ML IV SOLN
INTRAVENOUS | Status: DC | PRN
  Administered 2011-09-13 (×3): 10 ug via INTRAVENOUS
  Administered 2011-09-13: 20 ug via INTRAVENOUS
  Administered 2011-09-13: 10 ug via INTRAVENOUS
  Administered 2011-09-13: 20 ug via INTRAVENOUS
  Administered 2011-09-13 (×2): 10 ug via INTRAVENOUS

## 2011-09-13 MED ORDER — DIPHENHYDRAMINE HCL 12.5 MG/5ML PO ELIX: 12.5000 mg | ORAL_SOLUTION | Freq: Four times a day (QID) | ORAL | Status: DC | PRN

## 2011-09-13 MED ORDER — CISATRACURIUM BESYLATE 2 MG/ML IV SOLN
INTRAVENOUS | Status: DC | PRN
  Administered 2011-09-13: 8 mg via INTRAVENOUS

## 2011-09-13 MED ORDER — AMLODIPINE BESYLATE 10 MG PO TABS
10.0000 mg | ORAL_TABLET | Freq: Every day | ORAL | Status: DC
  Administered 2011-09-13 – 2011-09-17 (×5): 10 mg via ORAL
  Filled 2011-09-13 (×6): qty 1

## 2011-09-13 MED ORDER — KETAMINE HCL 10 MG/ML IJ SOLN
INTRAMUSCULAR | Status: DC | PRN
  Administered 2011-09-13 (×3): 10 mg via INTRAVENOUS
  Administered 2011-09-13: 20 mg via INTRAVENOUS

## 2011-09-13 MED ORDER — HYDROMORPHONE HCL PF 1 MG/ML IJ SOLN
0.2500 mg | INTRAMUSCULAR | Status: DC | PRN
  Administered 2011-09-13 (×2): 0.5 mg via INTRAVENOUS

## 2011-09-13 MED ORDER — RIVAROXABAN 10 MG PO TABS
10.0000 mg | ORAL_TABLET | ORAL | Status: DC
  Administered 2011-09-14 – 2011-09-17 (×4): 10 mg via ORAL
  Filled 2011-09-13 (×5): qty 1

## 2011-09-13 MED ORDER — EPHEDRINE SULFATE 50 MG/ML IJ SOLN
INTRAMUSCULAR | Status: DC | PRN
  Administered 2011-09-13: 10 mg via INTRAVENOUS

## 2011-09-13 MED ORDER — GABAPENTIN 800 MG PO TABS
800.0000 mg | ORAL_TABLET | Freq: Four times a day (QID) | ORAL | Status: DC
  Filled 2011-09-13 (×2): qty 1

## 2011-09-13 MED ORDER — METOCLOPRAMIDE HCL 5 MG/ML IJ SOLN
5.0000 mg | Freq: Three times a day (TID) | INTRAMUSCULAR | Status: DC | PRN
Start: 2011-09-13 — End: 2011-09-18

## 2011-09-13 MED ORDER — GABAPENTIN 400 MG PO CAPS
800.0000 mg | ORAL_CAPSULE | Freq: Four times a day (QID) | ORAL | Status: DC
  Administered 2011-09-13 – 2011-09-18 (×18): 800 mg via ORAL
  Filled 2011-09-13 (×21): qty 2

## 2011-09-13 MED ORDER — DIPHENHYDRAMINE HCL 50 MG/ML IJ SOLN: 12.5000 mg | Freq: Four times a day (QID) | INTRAMUSCULAR | Status: DC | PRN

## 2011-09-13 MED ORDER — HYDROMORPHONE 0.3 MG/ML IV SOLN
INTRAVENOUS | Status: AC
  Filled 2011-09-13: qty 25

## 2011-09-13 MED ORDER — BUPIVACAINE-EPINEPHRINE 0.25% -1:200000 IJ SOLN
INTRAMUSCULAR | Status: DC | PRN
  Administered 2011-09-13: 30 mL

## 2011-09-13 MED ORDER — METHOCARBAMOL 100 MG/ML IJ SOLN
500.0000 mg | Freq: Once | INTRAVENOUS | Status: AC
  Administered 2011-09-13: 500 mg via INTRAVENOUS
  Filled 2011-09-13: qty 5

## 2011-09-13 MED ORDER — FENTANYL CITRATE 0.05 MG/ML IJ SOLN
INTRAMUSCULAR | Status: AC
  Filled 2011-09-13: qty 2

## 2011-09-13 MED ORDER — SODIUM CHLORIDE 0.9 % IR SOLN: Status: DC

## 2011-09-13 MED ORDER — MIRTAZAPINE 15 MG PO TABS
15.0000 mg | ORAL_TABLET | Freq: Every day | ORAL | Status: DC
  Administered 2011-09-13 – 2011-09-17 (×5): 15 mg via ORAL
  Filled 2011-09-13 (×6): qty 1

## 2011-09-13 MED ORDER — POVIDONE-IODINE 7.5 % EX SOLN: Freq: Once | CUTANEOUS | Status: DC

## 2011-09-13 MED ORDER — CALCIUM CARBONATE 1250 (500 CA) MG PO TABS
1250.0000 mg | ORAL_TABLET | Freq: Two times a day (BID) | ORAL | Status: DC
  Administered 2011-09-14 – 2011-09-18 (×9): 1250 mg via ORAL
  Filled 2011-09-13 (×11): qty 1

## 2011-09-13 MED ORDER — SODIUM CHLORIDE 0.9 % IJ SOLN: 9.0000 mL | INTRAMUSCULAR | Status: DC | PRN

## 2011-09-13 MED ORDER — DEXTROSE-NACL 5-0.9 % IV SOLN
INTRAVENOUS | Status: DC
  Administered 2011-09-14: 100 mL/h via INTRAVENOUS
  Administered 2011-09-14 (×2): via INTRAVENOUS

## 2011-09-13 MED ORDER — MIDAZOLAM HCL 5 MG/5ML IJ SOLN
INTRAMUSCULAR | Status: DC | PRN
  Administered 2011-09-13: 2 mg via INTRAVENOUS

## 2011-09-13 MED ORDER — ACETAMINOPHEN 10 MG/ML IV SOLN
INTRAVENOUS | Status: DC | PRN
  Administered 2011-09-13: 1000 mg via INTRAVENOUS

## 2011-09-13 MED ORDER — VITAMIN D3 25 MCG (1000 UNIT) PO TABS
1000.0000 [IU] | ORAL_TABLET | Freq: Every day | ORAL | Status: DC
  Administered 2011-09-13 – 2011-09-17 (×5): 1000 [IU] via ORAL
  Filled 2011-09-13 (×6): qty 1

## 2011-09-13 MED ORDER — ONDANSETRON HCL 4 MG/2ML IJ SOLN
INTRAMUSCULAR | Status: DC | PRN
  Administered 2011-09-13: 4 mg via INTRAVENOUS

## 2011-09-13 MED ORDER — HYDROMORPHONE 0.3 MG/ML IV SOLN
INTRAVENOUS | Status: DC
  Administered 2011-09-13: 0.9 mg via INTRAVENOUS
  Administered 2011-09-13: 16:00:00 via INTRAVENOUS
  Administered 2011-09-13: 3.9 mg via INTRAVENOUS

## 2011-09-13 MED ORDER — PROPOFOL 10 MG/ML IV EMUL
INTRAVENOUS | Status: DC | PRN
  Administered 2011-09-13: 150 mg via INTRAVENOUS

## 2011-09-13 MED ORDER — ACETAMINOPHEN 10 MG/ML IV SOLN
INTRAVENOUS | Status: AC
  Filled 2011-09-13: qty 100

## 2011-09-13 MED ORDER — VITAMIN B-6 50 MG PO TABS
50.0000 mg | ORAL_TABLET | Freq: Every day | ORAL | Status: DC
  Administered 2011-09-14 – 2011-09-17 (×4): 50 mg via ORAL
  Filled 2011-09-13 (×7): qty 1

## 2011-09-13 MED ORDER — FENTANYL CITRATE 0.05 MG/ML IJ SOLN
50.0000 ug | INTRAMUSCULAR | Status: DC | PRN
  Administered 2011-09-13: 50 ug via INTRAVENOUS

## 2011-09-13 MED ORDER — CEFAZOLIN SODIUM 1-5 GM-% IV SOLN
1.0000 g | Freq: Four times a day (QID) | INTRAVENOUS | Status: AC
  Administered 2011-09-14 (×2): 1 g via INTRAVENOUS
  Filled 2011-09-13 (×3): qty 50

## 2011-09-13 MED ORDER — DEXAMETHASONE SODIUM PHOSPHATE 4 MG/ML IJ SOLN
INTRAMUSCULAR | Status: DC | PRN
  Administered 2011-09-13: 10 mg via INTRAVENOUS

## 2011-09-13 MED ORDER — MUPIROCIN 2 % EX OINT
TOPICAL_OINTMENT | CUTANEOUS | Status: AC
  Filled 2011-09-13: qty 22

## 2011-09-13 MED ORDER — METOCLOPRAMIDE HCL 10 MG PO TABS: 5.0000 mg | ORAL_TABLET | Freq: Three times a day (TID) | ORAL | Status: DC | PRN

## 2011-09-13 MED ORDER — LACTATED RINGERS IV SOLN
INTRAVENOUS | Status: DC
  Administered 2011-09-13 (×3): via INTRAVENOUS
  Administered 2011-09-13: 1000 mL via INTRAVENOUS

## 2011-09-13 MED ORDER — HYDROMORPHONE HCL PF 1 MG/ML IJ SOLN
0.2500 mg | INTRAMUSCULAR | Status: DC | PRN
  Administered 2011-09-13 (×4): 0.5 mg via INTRAVENOUS

## 2011-09-13 MED ORDER — METHOCARBAMOL 500 MG PO TABS
500.0000 mg | ORAL_TABLET | Freq: Four times a day (QID) | ORAL | Status: DC | PRN
  Administered 2011-09-14 – 2011-09-18 (×12): 500 mg via ORAL
  Filled 2011-09-13 (×12): qty 1

## 2011-09-13 MED ORDER — BUPIVACAINE-EPINEPHRINE PF 0.25-1:200000 % IJ SOLN
INTRAMUSCULAR | Status: AC
  Filled 2011-09-13: qty 30

## 2011-09-13 MED ORDER — NALOXONE HCL 0.4 MG/ML IJ SOLN
0.4000 mg | INTRAMUSCULAR | Status: DC | PRN
  Administered 2011-09-13: 0.4 mg via INTRAVENOUS
  Filled 2011-09-13: qty 1

## 2011-09-13 MED ORDER — CARBIDOPA-LEVODOPA 25-250 MG PO TABS
1.0000 | ORAL_TABLET | Freq: Three times a day (TID) | ORAL | Status: DC
  Administered 2011-09-14 – 2011-09-18 (×13): 1 via ORAL
  Filled 2011-09-13 (×15): qty 1

## 2011-09-13 MED ORDER — ONDANSETRON HCL 4 MG PO TABS: 4.0000 mg | ORAL_TABLET | Freq: Four times a day (QID) | ORAL | Status: DC | PRN

## 2011-09-13 MED ORDER — SODIUM CHLORIDE 0.9 % IR SOLN
Status: DC | PRN
  Administered 2011-09-13: 1000 mL

## 2011-09-13 SURGICAL SUPPLY — 55 items
BAG SPEC THK2 15X12 ZIP CLS (MISCELLANEOUS) ×2
BAG ZIPLOCK 12X15 (MISCELLANEOUS) ×4 IMPLANT
BANDAGE ELASTIC 4 VELCRO ST LF (GAUZE/BANDAGES/DRESSINGS) ×2 IMPLANT
BANDAGE ELASTIC 6 VELCRO ST LF (GAUZE/BANDAGES/DRESSINGS) ×2 IMPLANT
BANDAGE ESMARK 6X9 LF (GAUZE/BANDAGES/DRESSINGS) ×1 IMPLANT
BANDAGE GAUZE ELAST BULKY 4 IN (GAUZE/BANDAGES/DRESSINGS) ×2 IMPLANT
BLADE SAG 18X100X1.27 (BLADE) ×2 IMPLANT
BNDG CMPR 9X6 STRL LF SNTH (GAUZE/BANDAGES/DRESSINGS) ×1
BNDG ESMARK 6X9 LF (GAUZE/BANDAGES/DRESSINGS) ×2
CEMENT BONE 1-PACK (Cement) ×4 IMPLANT
CLOTH BEACON ORANGE TIMEOUT ST (SAFETY) ×2 IMPLANT
CONT SPECI 4OZ STER CLIK (MISCELLANEOUS) ×2 IMPLANT
CUFF TOURN SGL QUICK 34 (TOURNIQUET CUFF) ×2
CUFF TRNQT CYL 34X4X40X1 (TOURNIQUET CUFF) ×1 IMPLANT
DRAPE EXTREMITY T 121X128X90 (DRAPE) ×2 IMPLANT
DRAPE LG THREE QUARTER DISP (DRAPES) ×2 IMPLANT
DRAPE POUCH INSTRU U-SHP 10X18 (DRAPES) ×2 IMPLANT
DRAPE U-SHAPE 47X51 STRL (DRAPES) ×2 IMPLANT
DRSG ADAPTIC 3X8 NADH LF (GAUZE/BANDAGES/DRESSINGS) ×2 IMPLANT
DRSG EMULSION OIL 3X16 NADH (GAUZE/BANDAGES/DRESSINGS) ×1 IMPLANT
DRSG PAD ABDOMINAL 8X10 ST (GAUZE/BANDAGES/DRESSINGS) ×3 IMPLANT
ELECT BLADE TIP CTD 4 INCH (ELECTRODE) ×3 IMPLANT
ELECT REM PT RETURN 9FT ADLT (ELECTROSURGICAL) ×2
ELECTRODE REM PT RTRN 9FT ADLT (ELECTROSURGICAL) ×1 IMPLANT
EVACUATOR 1/8 PVC DRAIN (DRAIN) ×2 IMPLANT
FACESHIELD LNG OPTICON STERILE (SAFETY) ×10 IMPLANT
GLOVE ECLIPSE 8.0 STRL XLNG CF (GLOVE) ×4 IMPLANT
GLOVE INDICATOR 8.0 STRL GRN (GLOVE) ×6 IMPLANT
GOWN STRL REIN XL XLG (GOWN DISPOSABLE) ×4 IMPLANT
HANDPIECE INTERPULSE COAX TIP (DISPOSABLE) ×2
IMMOBILIZER KNEE 20 (SOFTGOODS) ×4
IMMOBILIZER KNEE 20 THIGH 36 (SOFTGOODS) IMPLANT
KIT BASIN OR (CUSTOM PROCEDURE TRAY) ×2 IMPLANT
MANIFOLD NEPTUNE II (INSTRUMENTS) ×2 IMPLANT
NEEDLE HYPO 22GX1.5 SAFETY (NEEDLE) ×2 IMPLANT
NS IRRIG 1000ML POUR BTL (IV SOLUTION) ×2 IMPLANT
PACK TOTAL JOINT (CUSTOM PROCEDURE TRAY) ×2 IMPLANT
POSITIONER SURGICAL ARM (MISCELLANEOUS) ×2 IMPLANT
SET HNDPC FAN SPRY TIP SCT (DISPOSABLE) ×1 IMPLANT
SPONGE GAUZE 4X4 12PLY (GAUZE/BANDAGES/DRESSINGS) ×2 IMPLANT
SPONGE LAP 18X18 X RAY DECT (DISPOSABLE) IMPLANT
SPONGE SURGIFOAM ABS GEL 100 (HEMOSTASIS) ×1 IMPLANT
STAPLER VISISTAT 35W (STAPLE) ×2 IMPLANT
SUCTION FRAZIER 12FR DISP (SUCTIONS) ×2 IMPLANT
SUT BONE WAX W31G (SUTURE) ×2 IMPLANT
SUT VIC AB 1 CT1 27 (SUTURE) ×12
SUT VIC AB 1 CT1 27XBRD ANTBC (SUTURE) ×6 IMPLANT
SUT VIC AB 2-0 CT1 27 (SUTURE) ×4
SUT VIC AB 2-0 CT1 27XBRD (SUTURE) ×2 IMPLANT
SYR 20CC LL (SYRINGE) ×2 IMPLANT
TOWEL OR 17X26 10 PK STRL BLUE (TOWEL DISPOSABLE) ×4 IMPLANT
TOWER CARTRIDGE SMART MIX (DISPOSABLE) ×2 IMPLANT
TRAY FOLEY CATH 14FRSI W/METER (CATHETERS) ×1 IMPLANT
WATER STERILE IRR 1500ML POUR (IV SOLUTION) ×2 IMPLANT
WRAP KNEE MAXI GEL POST OP (GAUZE/BANDAGES/DRESSINGS) ×4 IMPLANT

## 2011-09-13 NOTE — Anesthesia Postprocedure Evaluation (Signed)
Anesthesia Post Note  Patient: [name]  Procedure(s) Performed: Procedure(s) (LRB): TOTAL KNEE ARTHROPLASTY (Right)  Anesthesia type: General  Patient location: PACU  Post pain: Pain level controlled  Post assessment: Post-op Vital signs reviewed  Last Vitals:  Filed Vitals:   09/13/11 1615  BP:   Pulse: 104  Temp:   Resp: 11    Post vital signs: Reviewed  Level of consciousness: sedated  Complications: No apparent anesthesia complications

## 2011-09-13 NOTE — Transfer of Care (Signed)
Immediate Anesthesia Transfer of Care Note  Patient: [name]  Procedure(s) Performed: Procedure(s) (LRB): TOTAL KNEE ARTHROPLASTY (Right)  Patient Location: PACU  Anesthesia Type: General  Level of Consciousness: awake, sedated and patient cooperative  Airway & Oxygen Therapy: Patient Spontanous Breathing and Patient connected to face mask oxygen  Post-op Assessment: Report given to PACU RN and Post -op Vital signs reviewed and stable  Post vital signs: Reviewed and stable  Complications: No apparent anesthesia complications

## 2011-09-13 NOTE — Brief Op Note (Signed)
09/13/2011  3:23 PM  PATIENT:  [name]  71 y.o. female  PRE-OPERATIVE DIAGNOSIS:  osteoarthritis right knee   POST-OPERATIVE DIAGNOSIS:  Osteoarthritis right knee  PROCEDURE:  Procedure(s) (LRB): TOTAL KNEE ARTHROPLASTY (Right)  SURGEON:  Surgeon(s) and Role:    * Drucilla Schmidt, MD - Primary  PHYSICIAN ASSISTANT:   ASSISTANTS: Leilani Able PAC  ANESTHESIA:   general  EBL:  Total I/O In: 2100 [I.V.:2100] Out: 125 [Urine:75; Blood:50]  BLOOD ADMINISTERED:none  DRAINS: (open) Hemovact drain(s) in the rt knee with  Suction Open   LOCAL MEDICATIONS USED:  MARCAINE     SPECIMEN:  none    COUNTS:  YES  TOURNIQUET:   Total Tourniquet Time Documented: Thigh (Right) - 97 minutes  DICTATION: .Other Dictation: Dictation Number [id number]  PLAN OF CARE: Admit to inpatient   PATIENT DISPOSITION:  PACU - hemodynamically stable.   Delay start of Pharmacological VTE agent (>24hrs) due to surgical blood loss or risk of bleeding: yes

## 2011-09-13 NOTE — Plan of Care (Signed)
Problem: Consults Goal: Diagnosis- Total Joint Replacement Outcome: Completed/Met Date Met:  09/13/11 Primary Total Knee

## 2011-09-13 NOTE — Preoperative (Signed)
Beta Blockers   Reason not to administer Beta Blockers:Not Applicable 

## 2011-09-13 NOTE — Progress Notes (Signed)
Pharmacy Documentation  Hemabate bladder irrigation ordered post-operatively. Could not find any reason pt would require bladder irrigation as patient is post op TKA. Attempted to page Dr Simonne Come x2 with no response. Paged D Woody PAC x 2 with no response. Called D Alexander [facility] on call and he gave a VO to d/c Hemabate.   Gwen Her PharmD  [phone] 09/13/2011 6:50 PM

## 2011-09-13 NOTE — Anesthesia Procedure Notes (Signed)
Procedure Name: Intubation Date/Time: 09/13/2011 12:52 PM Performed by: Leroy Libman L Patient Re-evaluated:Patient Re-evaluated prior to inductionOxygen Delivery Method: Circle system utilized Preoxygenation: Pre-oxygenation with 100% oxygen Intubation Type: IV induction Ventilation: Mask ventilation without difficulty and Oral airway inserted - appropriate to patient size Laryngoscope Size: Hyacinth Meeker and 2 Grade View: Grade I Tube type: Oral Tube size: 7.5 mm Number of attempts: 1 Airway Equipment and Method: Stylet Placement Confirmation: ETT inserted through vocal cords under direct vision,  breath sounds checked- equal and bilateral and positive ETCO2 Secured at: 21 cm Tube secured with: Tape Dental Injury: Teeth and Oropharynx as per pre-operative assessment

## 2011-09-13 NOTE — Anesthesia Preprocedure Evaluation (Signed)
Anesthesia Evaluation  Patient identified by MRN, date of birth, ID band Patient awake    Reviewed: Allergy & Precautions, H&P , NPO status , Patient's Chart, lab work & pertinent test results, reviewed documented beta blocker date and time   Airway  TM Distance: >3 FB Neck ROM: Full    Dental  (+) Dental Advisory Given and Chipped,    Pulmonary neg pulmonary ROS,  breath sounds clear to auscultation        Cardiovascular hypertension, Pt. on medications Rhythm:Regular Rate:Normal  Denies cardiac symptoms   Neuro/Psych negative neurological ROS  negative psych ROS   GI/Hepatic negative GI ROS, Neg liver ROS,   Endo/Other  negative endocrine ROS  Renal/GU negative Renal ROS  negative genitourinary   Musculoskeletal   Abdominal   Peds negative pediatric ROS (+)  Hematology negative hematology ROS (+)   Anesthesia Other Findings Caps in front/ chipped teeth  Reproductive/Obstetrics negative OB ROS                           Anesthesia Physical Anesthesia Plan  ASA: II  Anesthesia Plan: General   Post-op Pain Management:    Induction: Intravenous  Airway Management Planned: Oral ETT  Additional Equipment:   Intra-op Plan:   Post-operative Plan: Extubation in OR  Informed Consent: I have reviewed the patients History and Physical, chart, labs and discussed the procedure including the risks, benefits and alternatives for the proposed anesthesia with the patient or authorized representative who has indicated his/her understanding and acceptance.   Dental advisory given  Plan Discussed with: CRNA and Surgeon  Anesthesia Plan Comments:         Anesthesia Quick Evaluation

## 2011-09-13 NOTE — Progress Notes (Signed)
Patient ID: [name], female   DOB: 04-11-1941, 71 y.o.   MRN: [id number] I have seen and re-evaluated her.  [name] ok for surgery.  Janyth Pupa MD

## 2011-09-14 LAB — URINALYSIS, ROUTINE W REFLEX MICROSCOPIC
Bilirubin Urine: NEGATIVE
Glucose, UA: NEGATIVE mg/dL
Hgb urine dipstick: NEGATIVE
Specific Gravity, Urine: 1.02 (ref 1.005–1.030)
pH: 6 (ref 5.0–8.0)

## 2011-09-14 LAB — TYPE AND SCREEN: ABO/RH(D): O POS

## 2011-09-14 LAB — ABO/RH: ABO/RH(D): O POS

## 2011-09-14 MED ORDER — OXYCODONE HCL 5 MG PO TABS
5.0000 mg | ORAL_TABLET | ORAL | Status: DC | PRN
  Administered 2011-09-14 (×3): 10 mg via ORAL
  Administered 2011-09-15 (×6): 5 mg via ORAL
  Administered 2011-09-16 – 2011-09-18 (×16): 10 mg via ORAL
  Filled 2011-09-14: qty 2
  Filled 2011-09-14: qty 1
  Filled 2011-09-14 (×8): qty 2
  Filled 2011-09-14: qty 1
  Filled 2011-09-14 (×4): qty 2
  Filled 2011-09-14: qty 1
  Filled 2011-09-14: qty 2
  Filled 2011-09-14: qty 1
  Filled 2011-09-14 (×3): qty 2
  Filled 2011-09-14: qty 1
  Filled 2011-09-14 (×3): qty 2

## 2011-09-14 MED ORDER — HYDROMORPHONE HCL 1 MG/ML PO LIQD: 1.0000 mg | ORAL | Status: DC | PRN

## 2011-09-14 MED ORDER — ATORVASTATIN CALCIUM 20 MG PO TABS
20.0000 mg | ORAL_TABLET | Freq: Every day | ORAL | Status: DC
  Administered 2011-09-14 – 2011-09-17 (×4): 20 mg via ORAL
  Filled 2011-09-14 (×5): qty 1

## 2011-09-14 MED ORDER — HYDROMORPHONE HCL PF 1 MG/ML IJ SOLN
0.5000 mg | INTRAMUSCULAR | Status: DC | PRN
  Administered 2011-09-14 (×3): 1 mg via INTRAVENOUS
  Administered 2011-09-15 (×2): 0.5 mg via INTRAVENOUS
  Filled 2011-09-14 (×6): qty 1

## 2011-09-14 MED ORDER — HYDROMORPHONE HCL PF 1 MG/ML IJ SOLN
1.0000 mg | INTRAMUSCULAR | Status: DC | PRN
  Administered 2011-09-14: 0.5 mg via INTRAVENOUS
  Administered 2011-09-14: 1 mg via INTRAVENOUS
  Filled 2011-09-14 (×2): qty 1

## 2011-09-14 NOTE — Progress Notes (Signed)
Utilization review completed.  

## 2011-09-14 NOTE — Progress Notes (Signed)
09/14/11 2230 Nursing Pt concerned with possible uti. Rexene Edison PA notified. Order received for ua

## 2011-09-14 NOTE — Op Note (Signed)
[name], [name] NO.:  [id number]  MEDICAL RECORD NO.:  [id number]  LOCATION:  1601                         FACILITY:  [facility]  PHYSICIAN:  Marlowe Kays, M.D.  DATE OF BIRTH:  01/01/1941  DATE OF PROCEDURE: DATE OF DISCHARGE:                              OPERATIVE REPORT   PREOPERATIVE DIAGNOSIS:  Osteoarthritis, right knee.  POSTOPERATIVE DIAGNOSIS:  Osteoarthritis, right knee.  OPERATION:  Osteonics total knee replacement, right.  SURGEON:  Marlowe Kays, M.D.  ASSISTANT:  Jaquelyn Bitter. Chabon, P.A.C.  ANESTHESIA:  General.  PATHOLOGY AND JUSTIFICATION FOR PROCEDURE:  She had medial compartment narrowing, but had lost all her articular cartilage essentially on both sides of the medial knee joint on recent arthroscopy.  Because of significant pain, she is here today for the above-mentioned surgery.  PROCEDURE IN DETAIL:  Prophylactic antibiotics, satisfied general anesthesia, Foley catheter inserted, pneumatic tourniquet.  Right leg was prepped with DuraPrep from tourniquet to ankle, draped in sterile field.  Time-out performed.  The leg was Esmarched out sterilely and tourniquet inflated to 350 mmHg.  Ioban employed.  Vertical midline incision down to the patellar mechanism with median parapatellar incision to open the joint.  Precise very difficulty to evert the patella, so I retracted it lateralward.  The knee was flexed, pes anserinus and medial collateral ligament were dissected off the proximal medial tibia.  Portions of the menisci and anterior cruciate, posterior cruciate ligament complex were excised.  I then made a drill hole in the distal femur and used the axis liner for 5-degree valgus cut.  I then used the distal femoral cutting jig based on scribe lines from the liner and took 12 mm rather usual 10 off the distal femur if she had a flexion contracture.  She had very little room in the tibial femoral joint and accordingly, I first  took a leveling cut off the proximal tibia, which allowed me to get the femoral cutting jig placed and also sized the distal femur at #5, which is what she had in her left knee.  I then placed the 5 jig for making the distal femoral cuts and made anterior and posterior cuts and posterior and anterior chamfer rings.  Then returned to the tibia, where I sized it at #5 as well.  Using 5 base plate, I made my first intramedullary drill hole followed by step-cut drill canal Finders and then placed intramedullary rod set for a 90- degree cut taking 4 mm off the depressed medial tibial plateau.  After these cuts were made, I then used the lamina spreader to remove remnants of bone and soft tissue from behind the femoral condyles.  Next, I placed the jig for creating the patellar groove and the notchplasty. After accomplishing this, I went through a trial reduction and found that a 12 and perhaps even a 15 mm spacer would be appropriate.  While the knee was in extension, I used the external rod splitting the bimalleolar distance to make scribe lines on the anterior tibia.  While the knee was in extension, I then used the 10 mm recess cutting jig for the patella and after making the 10 mm cut, I then  placed the guide for creating the 3 fixation holes.  After making these, I then placed a trial 26 patella and trimmed excess bone from around the perimeter. Returning to the tibia, I then used the 5 base plate and using the scribe lines previously constructed, I attached it to the tibia, cut the tibia with 3 pins.  I then used the tripod apparatus to ream up to a 5 cemented tibial components.  We then irrigated the wound well with a pressure washer while the appropriate size components were opened and the methacrylate mixed.  I then glued in the 3 components starting first with the tibia, impacting it, and removing excess methacrylate.  The same was done for the femur and I then hand held the knee in  full extension with a 12 mm spacer while the patella was methacrylate at the end.  The patellar button was held with a patellar holding clamp.  When the methacrylate hardened, I trimmed up small bits from around the components.  We irrigated the knee joint, found that there was clear of debris and placed Gelfoam in the distal femoral hole and infiltrated all soft tissues with 0.25% Marcaine with adrenaline.  I then tried a 15 mm spacer and found that this was more appropriate than the 12 with the knee coming in the full extension and stable in both flexion and extension.  Accordingly, I then used the final 15 mm spacer, which was placed and flexion-extension the knee, no lateral release was required. Hemovac was placed.  The knee joint was then closed in layers with interrupted #1 Vicryl in the quadriceps tendon and distally in the capsule.  2-0 Vicryl, subcutaneous tissue; staples in the skin. Betadine, Adaptic, dry sterile dressing were applied.  Tourniquet was released.  She tolerated the procedure well, was taken to the recovery room in satisfactory condition with no known complications.          ______________________________ Marlowe Kays, M.D.     JA/MEDQ  D:  09/13/2011  T:  09/14/2011  Job:  [id number]

## 2011-09-14 NOTE — Evaluation (Signed)
Physical Therapy Evaluation Patient Details Name: [name] MRN: [id number] DOB: Sep 09, 1940 Today's Date: 09/14/2011  Problem List: There is no problem list on file for this patient.   Past Medical History:  Past Medical History  Diagnosis Date  . Hypertension   . GERD (gastroesophageal reflux disease)     hx pud '98  . Arthritis     djd  . Restless leg syndrome   . 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 Dysrhythmia     hx tachy palpitations  . Headache     remote hx of migraines  . Neuromuscular disorder     peripheral neuropathy, FEET AND LEGS  . Depression     bh admission '09   Past Surgical History:  Past Surgical History  Procedure Date  . Cervical disc surgery x2 2011  . Rt carotid enarterectomy 2011  . Rt knee arthroscopy '99  . Left knee arthroscopy 2001  . Hematoma evacuation 2011    s/p rt cea  . Joint replacement '01    total knee replacement, LEFT  . Abdominal hysterectomy 1975    bil oophorectomy  . Back surgery  MANY YRS AGO    laminectomy x3  . Cholecystectomy 1993    PT Assessment/Plan/Recommendation PT Assessment Clinical Impression Statement: pt s/p right TKA, will benefit from PT to maximize independence for home setting with sons' support.  POD #1 today. Pain 5/10 right knee; premedicated PT Recommendation/Assessment: Patient will need skilled PT in the acute care venue PT Problem List: Decreased strength;Decreased range of motion;Decreased activity tolerance;Decreased balance;Decreased knowledge of use of DME;Pain PT Plan PT Frequency: 7X/week PT Treatment/Interventions: DME instruction;Gait training;Stair training;Functional mobility training;Therapeutic activities;Therapeutic exercise;Patient/family education PT Recommendation Follow Up Recommendations: Home health PT;Supervision - Intermittent Equipment Recommended: None recommended by PT PT Goals  Acute Rehab PT Goals PT Goal Formulation: With patient Time For  Goal Achievement: 5 days Pt will go Supine/Side to Sit: with supervision PT Goal: Supine/Side to Sit - Progress: Goal set today Pt will go Sit to Supine/Side: with supervision PT Goal: Sit to Supine/Side - Progress: Goal set today Pt will go Sit to Stand: with supervision PT Goal: Sit to Stand - Progress: Goal set today Pt will go Stand to Sit: with supervision PT Goal: Stand to Sit - Progress: Goal set today Pt will Perform Home Exercise Program: with supervision, verbal cues required/provided PT Goal: Perform Home Exercise Program - Progress: Goal set today  PT Evaluation Precautions/Restrictions  Precautions Precautions: Knee Restrictions Weight Bearing Restrictions: Yes RLE Weight Bearing: Weight bearing as tolerated Prior Functioning  Home Living Lives With: Alone Available Help at Discharge: Family;Available 24 hours/day (sons) Type of Home: House Home Access: Other (comment) (threshold) Home Layout: One level Bathroom Toilet: Standard Home Adaptive Equipment: Walker - rolling Additional Comments: gave most DME away to church Prior Function Level of Independence: Independent Driving: No (neighbors assisting) Cognition Cognition Arousal/Alertness: Awake/alert Overall Cognitive Status: Appears within functional limits for tasks assessed Orientation Level: Oriented X4 Sensation/Coordination   Extremity Assessment RUE Assessment RUE Assessment: Within Functional Limits LUE Assessment LUE Assessment: Within Functional Limits RLE Assessment RLE Assessment: Exceptions to [facility] (ankle WFL; knee 25-35*; limited ext before surgery; ) LLE Assessment LLE Assessment: Within Functional Limits Mobility (including Balance) Bed Mobility Bed Mobility: Yes Supine to Sit: 4: Min assist;HOB flat Supine to Sit Details (indicate cue type and reason): cues for scooting and use of UEs Sitting - Scoot to [place] of  Bed: 4: Min assist Sitting - Scoot to Edge of Bed Details (indicate cue  type and reason): to support RLE Transfers Transfers: Yes Sit to Stand: 4: Min assist;With upper extremity assist;From bed Sit to Stand Details (indicate cue type and reason): cues for hands and wt shift  Stand to Sit: 4: Min assist;With armrests;To chair/3-in-1 Stand to Sit Details: cues for hand placement Ambulation/Gait Ambulation/Gait: Yes Ambulation/Gait Assistance: 4: Min assist Ambulation/Gait Assistance Details (indicate cue type and reason): cues for sequence; LOBx1 with min to recover Ambulation Distance (Feet): 40 Feet Assistive device: Rolling walker Gait Pattern: Step-to pattern  Posture/Postural Control Posture/Postural Control: No significant limitations Exercise    End of Session PT - End of Session Equipment Utilized During Treatment: Gait belt Activity Tolerance: Patient tolerated treatment well Patient left: in chair;with call bell in reach Nurse Communication: Mobility status for transfers;Mobility status for ambulation General Behavior During Session: [facility] for tasks performed Cognition: [facility] for tasks performed [facility] 09/14/2011, 10:51 AM

## 2011-09-14 NOTE — Progress Notes (Signed)
CARE MANAGEMENT NOTE 09/14/2011  Patient:  [name], [name]   Account Number:  [id number]  Date Initiated:  09/14/2011  Documentation initiated by:  Colleen Can  Subjective/Objective Assessment:   DX osteoarthritis rt knee; total knee replacemnt-osteonics     Action/Plan:   CM spoke with patient. Plans are for patient to retrun to her home in [place] where she wil have caregivers for several days. States she already has DME-rw &3 N1. [name] requesting [facility] -has used in the past.   Anticipated DC Date:  09/16/2011   Anticipated DC Plan:  HOME W HOME HEALTH SERVICES  In-house referral  NA      DC Planning Services  CM consult      [facility] Choice  HOME HEALTH   Choice offered to / List presented to:  C-1 Patient   DME arranged  NA      DME agency  NA     HH arranged  HH-2 PT      [facility] agency  [facility]   Status of service:  In process, will continue to follow Comments:  09/14/2011 Raynelle Bring BSN CCM [phone] List of [facility] agencies placed on shadow chart. Cm will follow for [facility] needs. Faxed  face sheet, op note, H&p to [facility] intake with confirmation received-[phone]. HH orders will be sent when orders are written by MD. CM will follow

## 2011-09-14 NOTE — Progress Notes (Signed)
Patient ID: [name], female   DOB: May 27, 1941, 71 y.o.   MRN: [id number] Po day 1--alert.  Moves foot well.  Hgb 12.6.  X-ray good.  Hemovac out.  Plan--home 4/15.

## 2011-09-14 NOTE — Progress Notes (Signed)
09/14/11 1100  PT Visit Information  Last PT Received On 09/14/11  Precautions  Precautions Knee  Precaution Comments pt tends to externally rotate RLE, pillow placed to allow slight hip stretch and prevent knee from flexing at rest  Restrictions  RLE Weight Bearing WBAT  Total Joint Exercises  Ankle Circles/Pumps AROM;Both;20 reps  Quad Sets AROM;Both;10 reps  Heel Slides AAROM;Right;10 reps  Hip ABduction/ADduction AAROM;10 reps;Right  Straight Leg Raises AAROM;Right;10 reps  PT - Assessment/Plan  Comments on Treatment Session pt doing well, pain 7-9/10; RN gave meds during session.Pt wanted to saty in chair until after lunch  PT Plan Discharge plan remains appropriate;Frequency remains appropriate  PT Frequency 7X/week  Follow Up Recommendations Home health PT;Supervision - Intermittent  Equipment Recommended None recommended by PT  Acute Rehab PT Goals  Pt will Perform Home Exercise Program with supervision, verbal cues required/provided  PT Goal: Perform Home Exercise Program - Progress Progressing toward goal

## 2011-09-14 NOTE — Progress Notes (Signed)
Patient with HR of 106. Patient states pain is a 9 on numeric pain scale. Dr. Simonne Come notified, no new orders given. Will continue to monitor.

## 2011-09-14 NOTE — Plan of Care (Signed)
Problem: Phase I Progression Outcomes Goal: Dangle evening of surgery Outcome: Not Met (add Reason) Increased pain.

## 2011-09-15 NOTE — Progress Notes (Signed)
Physical Therapy Treatment Patient Details Name: [name] MRN: [id number] DOB: 23-Dec-1940 Today's Date: 09/15/2011  PT Assessment/Plan  PT - Assessment/Plan Comments on Treatment Session: pt declined OOB this am stating she had been up to chair already and in too much pain at thsi time. had pain meds earlier PT Plan: Discharge plan remains appropriate;Frequency remains appropriate PT Frequency: 7X/week Follow Up Recommendations: Home health PT;Supervision - Intermittent Equipment Recommended: None recommended by PT PT Goals  Acute Rehab PT Goals Pt will Perform Home Exercise Program: with supervision, verbal cues required/provided PT Goal: Perform Home Exercise Program - Progress: Progressing toward goal  PT Treatment Precautions/Restrictions  Precautions Precautions: Knee Precaution Comments: pt tends to externally rotate RLE, pillow placed to allow slight hip stretch and prevent knee from flexing at rest Required Braces or Orthoses: Knee Immobilizer - Right Restrictions Weight Bearing Restrictions: No RLE Weight Bearing: Weight bearing as tolerated Mobility (including Balance)      Exercise  Total Joint Exercises Ankle Circles/Pumps: AROM;Both;20 reps Quad Sets: AROM;Both;10 reps Towel Squeeze: AROM;Both;10 reps;5 reps Heel Slides: AAROM;Right;10 reps Straight Leg Raises: AAROM;Right;5 reps End of Session PT - End of Session Activity Tolerance: Patient tolerated treatment well Patient left: in bed;with call bell in reach;with family/visitor present Nurse Communication: Mobility status for transfers;Mobility status for ambulation General Behavior During Session: [facility] for tasks performed Cognition: [facility] for tasks performed  [facility] 09/15/2011, 11:07 AM

## 2011-09-15 NOTE — Progress Notes (Signed)
09/15/11 0145 Nursing Rexene Edison PA Called reg patient's pulse consistently at 130/131 this shift. Bp med held at 10 pm due to low bp at that time. Norvasc given at midnight due to bp in 180's. 2 tylenol given for temp above 101at that time. Patient co of high amt of pain through out shift. vss now 142/70 ,131 p,  100.4,  92ra. Patient encoragedto use is . No orders received. Will continue to monitor patient.

## 2011-09-15 NOTE — Progress Notes (Signed)
Subjective: Procedure(s) (LRB): TOTAL KNEE ARTHROPLASTY (Right) 2 Days Post-Op  Patient reports pain as moderate.  Reports unchanged leg pain Positive void Negative bowel movement Positive flatus Negative chest pain or shortness of breath  Objective: Vital signs in last 24 hours: Temp:  [98.4 F (36.9 C)-101.8 F (38.8 C)] 99.6 F (37.6 C) (04/13 0445) Pulse Rate:  [95-138] 130  (04/13 0445) Resp:  [12-18] 12  (04/13 0445) BP: (114-182)/(69-76) 156/72 mmHg (04/13 0445) SpO2:  [80 %-96 %] 90 % (04/13 0446) FiO2 (%):  [2 %] 2 % (04/13 0446)  Intake/Output from previous day: 04/12 0701 - 04/13 0700 In: 2450 [P.O.:1200; I.V.:1200; IV Piggyback:50] Out: 2000 [Urine:2000]   Basename 09/13/11 1000  WBC 7.1  RBC 3.87  HCT 39.3  PLT 375    Basename 09/13/11 1010  NA 134*  K 3.9  CL 99  CO2 27  BUN 13  CREATININE 0.44*  GLUCOSE 125*  CALCIUM 9.7    Basename 09/13/11 1010  LABPT --  INR 0.96    ABD soft Neurovascular intact Sensation intact distally Intact pulses distally Dorsiflexion/Plantar flexion intact Incision: dressing C/D/I Compartment soft  Assessment/Plan: Patient stable - eating breakfast this morning Mobilization with physical therapy Continue care - plan for dc to home on Monday   Jehan Ranganathan J 09/15/2011, 9:11 AM

## 2011-09-15 NOTE — Progress Notes (Signed)
Physical Therapy Treatment Patient Details Name: [name] MRN: [id number] DOB: 1940-06-09 Today's Date: 09/15/2011  PT Assessment/Plan  PT - Assessment/Plan Comments on Treatment Session: pt requiring increased assist this pm; pt states her LEs feel "numb"  RN aware, also with c/o pain; RN gave meds; pt may need STSNF if cont slow progress PT Plan: Frequency remains appropriate;Discharge plan remains appropriate PT Frequency: 7X/week Follow Up Recommendations: Home health PT;Supervision - Intermittent Equipment Recommended: None recommended by PT PT Goals  Acute Rehab PT Goals Pt will go Supine/Side to Sit: with supervision PT Goal: Supine/Side to Sit - Progress: Progressing toward goal Pt will go Sit to Supine/Side: with supervision PT Goal: Sit to Supine/Side - Progress: Not progressing Pt will go Sit to Stand: with supervision PT Goal: Sit to Stand - Progress: Progressing toward goal Pt will go Stand to Sit: with supervision PT Goal: Stand to Sit - Progress: Not progressing  PT Treatment Precautions/Restrictions  Precautions Precautions: Knee Precaution Comments: pt tends to externally rotate RLE, pillow placed to allow slight hip stretch and prevent knee from flexing at rest Required Braces or Orthoses: Knee Immobilizer - Right Restrictions Weight Bearing Restrictions: No RLE Weight Bearing: Weight bearing as tolerated Mobility (including Balance) Bed Mobility Supine to Sit: 1: +2 Total assist;HOB elevated (Comment degrees);Patient percentage (comment) (pt=40%) Supine to Sit Details (indicate cue type and reason): +2 for scooting, UB assist and RLE; pt requiring more assist today Sitting - Scoot to Edge of Bed: 4: Min assist;3: Mod assist Sit to Supine: 1: +2 Total assist;HOB flat Sit to Supine - Details (indicate cue type and reason): pt=40%; +2 for UB, RLE, positioning Transfers Sit to Stand: 1: +2 Total assist;Patient percentage (comment);From bed;From  chair/3-in-1;With upper extremity assist Sit to Stand Details (indicate cue type and reason): pt=30-50%; requiring increased assist this session; pt with LEFT knee buckling once standing and requiring +2 to prevent fall Stand to Sit: 1: +2 Total assist;Patient percentage (comment) (pt = 40%) Stand to Sit Details: same Stand Pivot Transfers: 1: +2 Total assist;Patient percentage (comment) (pt=30-40%) Stand Pivot Transfer Details (indicate cue type and reason): cues for trunk extension, wt shift, sequence and RW safety; bed to chair to bed Ambulation/Gait Ambulation/Gait Assistance Details (indicate cue type and reason): approximately 4 steps, pt unable to continue due to fatigue and pain Gait Pattern: Decreased step length - right;Decreased step length - left;Trunk flexed    Exercise    End of Session  pt left in bed, with RN present.  VSS as follows(NT aware): BP 131/75;95%; HR121 after amb   [facility] 09/15/2011, 2:59 PM

## 2011-09-16 NOTE — Progress Notes (Signed)
Subjective: 3 Days Post-Op Procedure(s) (LRB): TOTAL KNEE ARTHROPLASTY (Right)   Patient reports pain as moderate. Pain controlled well with medication. No events throughout the night.  Objective:   VITALS:   Filed Vitals:   09/16/11 0500  BP: 140/80  Pulse: 124  Temp: 98.8 F (37.1 C)  Resp: 20    Neurovascular intact Dorsiflexion/Plantar flexion intact Incision: dressing C/D/I No cellulitis present Compartment soft Some swelling in the right foot, denies any calf pain.  LABS  Basename 09/13/11 1000  HGB 12.6  HCT 39.3  WBC 7.1  PLT 375     Basename 09/13/11 1010  NA 134*  K 3.9  BUN 13  CREATININE 0.44*  GLUCOSE 125*     Assessment/Plan: 3 Days Post-Op Procedure(s) (LRB): TOTAL KNEE ARTHROPLASTY (Right)   Dressing changed without difficulty, incision looked good. Continue plan Up with therapy The pt states that plan was to discharge Monday/Tuesday.   Anastasio Auerbach Makenzey Nanni   PAC  09/16/2011, 8:37 AM

## 2011-09-16 NOTE — Plan of Care (Signed)
Problem: Phase III Progression Outcomes Goal: Pain controlled on oral analgesia Outcome: Completed/Met Date Met:  09/16/11 Oxy IR for pain

## 2011-09-16 NOTE — Progress Notes (Signed)
09/16/11 1500  PT Visit Information  Last PT Received On 09/16/11  Precautions  Precautions Knee  Required Braces or Orthoses Knee Immobilizer - Right  Restrictions  RLE Weight Bearing WBAT  Bed Mobility  Sit to Supine 4: Min assist;HOB flat  Sit to Supine - Details (indicate cue type and reason) min with RLE, cues for technique  Transfers  Sit to Stand 4: Min assist;With upper extremity assist;From chair/3-in-1;From bed  Sit to Stand Details (indicate cue type and reason) cues for hand placement  Stand to Sit 4: Min assist;With armrests;To chair/3-in-1  Stand to Sit Details cues for hand placement  Ambulation/Gait  Ambulation/Gait Assistance 4: Min assist;5: Supervision  Ambulation/Gait Assistance Details (indicate cue type and reason) cues for sequence and posture  Ambulation Distance (Feet) 120 Feet (10)  Assistive device Rolling walker  Gait Pattern Step-to pattern;Step-through pattern  PT - End of Session  Activity Tolerance Patient tolerated treatment well  Patient left in bed;with call bell in reach  General  Behavior During Session [facility] for tasks performed  Cognition [facility] for tasks performed  PT - Assessment/Plan  Comments on Treatment Session TED hose applied to RLE, mildly increased swelling; pt progressing well  PT Plan Frequency remains appropriate;Discharge plan remains appropriate  PT Frequency 7X/week  Follow Up Recommendations Home health PT;Supervision - Intermittent  Equipment Recommended None recommended by PT  Acute Rehab PT Goals  Pt will go Supine/Side to Sit with supervision  PT Goal: Supine/Side to Sit - Progress Progressing toward goal  Pt will go Sit to Supine/Side with supervision  PT Goal: Sit to Supine/Side - Progress Progressing toward goal  Pt will go Sit to Stand with supervision  PT Goal: Sit to Stand - Progress Progressing toward goal  Pt will go Stand to Sit with supervision  PT Goal: Stand to Sit - Progress Progressing toward goal

## 2011-09-16 NOTE — Plan of Care (Signed)
Problem: Phase III Progression Outcomes Goal: Incision clean - minimal/no drainage Outcome: Progressing C/D/I

## 2011-09-16 NOTE — Progress Notes (Signed)
Physical Therapy Treatment Patient Details Name: [name] MRN: [id number] DOB: 04/08/1941 Today's Date: 09/16/2011  PT Assessment/Plan  PT - Assessment/Plan Comments on Treatment Session: pt feeling better this am. amb better, no issues with LLE  PT Plan: Frequency remains appropriate;Discharge plan remains appropriate PT Frequency: 7X/week Follow Up Recommendations: Home health PT;Supervision - Intermittent Equipment Recommended: None recommended by PT PT Goals  Acute Rehab PT Goals Pt will go Supine/Side to Sit: with supervision PT Goal: Supine/Side to Sit - Progress: Progressing toward goal Pt will go Sit to Supine/Side: with supervision PT Goal: Sit to Supine/Side - Progress: Progressing toward goal Pt will go Sit to Stand: with supervision PT Goal: Sit to Stand - Progress: Progressing toward goal Pt will go Stand to Sit: with supervision PT Goal: Stand to Sit - Progress: Progressing toward goal Pt will Perform Home Exercise Program: with supervision, verbal cues required/provided PT Goal: Perform Home Exercise Program - Progress: Progressing toward goal  PT Treatment Precautions/Restrictions  Precautions Precautions: Knee Precaution Comments: pt tends to externally rotate RLE, pillow placed to allow slight hip stretch and prevent knee from flexing at rest Required Braces or Orthoses: Knee Immobilizer - Right Restrictions Weight Bearing Restrictions: No RLE Weight Bearing: Weight bearing as tolerated Mobility (including Balance) Bed Mobility Supine to Sit: 4: Min assist;HOB flat Supine to Sit Details (indicate cue type and reason): min with RLE, cues for technique Sitting - Scoot to Edge of Bed: 4: Min assist Transfers Sit to Stand: 4: Min assist;With upper extremity assist;From chair/3-in-1;From bed Sit to Stand Details (indicate cue type and reason): cues for hand placement Stand to Sit: 4: Min assist;With armrests;To chair/3-in-1 Stand to Sit Details: cues for  hand placement Ambulation/Gait Ambulation/Gait Assistance: 4: Min assist Ambulation/Gait Assistance Details (indicate cue type and reason): cues for sequence and posture Ambulation Distance (Feet): 45 Feet (10) Assistive device: Rolling walker Gait Pattern: Step-to pattern  Posture/Postural Control Posture/Postural Control: No significant limitations Exercise  Total Joint Exercises Ankle Circles/Pumps: AROM;Both;20 reps Quad Sets: AROM;Both;10 reps Towel Squeeze: AROM;Both;10 reps;5 reps Heel Slides: AAROM;Right;10 reps Hip ABduction/ADduction: AAROM;10 reps;Right Straight Leg Raises: AAROM;10 reps;Right End of Session PT - End of Session Activity Tolerance: Patient tolerated treatment well Patient left: in chair;with call bell in reach;with family/visitor present Nurse Communication: Mobility status for transfers;Mobility status for ambulation General Behavior During Session: [facility] for tasks performed Cognition: [facility] for tasks performed  [facility] 09/16/2011, 10:19 AM

## 2011-09-17 LAB — HEMOGLOBIN AND HEMATOCRIT, BLOOD: HCT: 26.6 % — ABNORMAL LOW (ref 36.0–46.0)

## 2011-09-17 NOTE — Progress Notes (Signed)
09/17/2011 Raynelle Bring BSN CCM [phone] Pt requesting HH aide. Orders for HHpt and HH aide faxed to [facility]-[phone]-confirmation received-spoke with Clydie Braun. Services will start on 04/17./2013 if patient is discharged 09/18/2011

## 2011-09-17 NOTE — Progress Notes (Signed)
Physical Therapy Treatment Patient Details Name: [name] MRN: [id number] DOB: 06-26-1940 Today's Date: 09/17/2011  PT Assessment/Plan  PT - Assessment/Plan Comments on Treatment Session: PT ltd this am by c/o back pain - agreeable on second attempt to mobilize PT Plan: Frequency remains appropriate;Discharge plan remains appropriate PT Frequency: 7X/week Follow Up Recommendations: Home health PT;Supervision - Intermittent Equipment Recommended: None recommended by PT PT Goals  Acute Rehab PT Goals PT Goal Formulation: With patient Time For Goal Achievement: 5 days Pt will go Supine/Side to Sit: with supervision PT Goal: Supine/Side to Sit - Progress: Progressing toward goal Pt will go Sit to Supine/Side: with supervision PT Goal: Sit to Supine/Side - Progress: Progressing toward goal Pt will go Sit to Stand: with supervision PT Goal: Sit to Stand - Progress: Progressing toward goal Pt will go Stand to Sit: with supervision PT Goal: Stand to Sit - Progress: Progressing toward goal Pt will Perform Home Exercise Program: with supervision, verbal cues required/provided  PT Treatment Precautions/Restrictions  Precautions Precautions: Knee Precaution Comments: pt tends to externally rotate RLE, pillow placed to allow slight hip stretch and prevent knee from flexing at rest Required Braces or Orthoses: Knee Immobilizer - Right Restrictions Weight Bearing Restrictions: No RLE Weight Bearing: Weight bearing as tolerated Mobility (including Balance) Bed Mobility Supine to Sit: 4: Min assist;HOB flat Supine to Sit Details (indicate cue type and reason): min assist with R LE Sitting - Scoot to Edge of Bed: 5: Supervision Sit to Supine: 4: Min assist;HOB flat Sit to Supine - Details (indicate cue type and reason): min assist with R LE Transfers Sit to Stand: 4: Min assist Sit to Stand Details (indicate cue type and reason): cues for use of UEs and for LE positioning Stand to  Sit: 4: Min assist Stand to Sit Details: cues for use of UEs and for LE positioning Ambulation/Gait Ambulation/Gait Assistance: 4: Min assist;5: Supervision Ambulation/Gait Assistance Details (indicate cue type and reason): cues for posture, sequence, and position from RW Ambulation Distance (Feet): 75 Feet (75'x2) Assistive device: Rolling walker Gait Pattern: Step-to pattern;Step-through pattern    Exercise    End of Session PT - End of Session Equipment Utilized During Treatment: Right knee immobilizer Activity Tolerance: Patient tolerated treatment well Patient left: in bed;with call bell in reach Nurse Communication: Mobility status for transfers;Mobility status for ambulation General Behavior During Session: [facility] for tasks performed Cognition: [facility] for tasks performed  Tinita Brooker 09/17/2011, 2:06 PM

## 2011-09-17 NOTE — Progress Notes (Signed)
Patient ID: [name], female   DOB: 1941/05/05, 71 y.o.   MRN: [id number] Afebrile.  Hgb today.  She feels she can make it at home tomorrow.  [name]

## 2011-09-18 MED ORDER — GABAPENTIN 400 MG PO CAPS: 800.0000 mg | ORAL_CAPSULE | Freq: Four times a day (QID) | ORAL | Status: DC

## 2011-09-18 MED ORDER — RIVAROXABAN 10 MG PO TABS: 10.0000 mg | ORAL_TABLET | ORAL | Status: DC

## 2011-09-18 MED ORDER — OXYCODONE HCL 5 MG PO TABS: 5.0000 mg | ORAL_TABLET | ORAL | Status: AC | PRN

## 2011-09-18 NOTE — Progress Notes (Signed)
Physical Therapy Treatment Patient Details Name: [name] MRN: [id number] DOB: 1940-09-05 Today's Date: 09/18/2011  PT Assessment/Plan  PT - Assessment/Plan Comments on Treatment Session: OOB activity deferred per pt request due to pt stating she has a lot of walking to do when she gets in her house.  Performed exercises in the bed.  Pt/son state that she can enter a door that does not have stairs, therefore she deferred practicing stairs.  PT Plan: Frequency remains appropriate;Discharge plan remains appropriate PT Frequency: 7X/week Follow Up Recommendations: Home health PT;Supervision - Intermittent Equipment Recommended: None recommended by PT PT Goals  Acute Rehab PT Goals PT Goal Formulation: With patient Time For Goal Achievement: 5 days Pt will Perform Home Exercise Program: with supervision, verbal cues required/provided PT Goal: Perform Home Exercise Program - Progress: Met  PT Treatment Precautions/Restrictions  Precautions Precautions: Knee Precaution Comments: pt tends to externally rotate RLE, pillow placed to allow slight hip stretch and prevent knee from flexing at rest Required Braces or Orthoses: Knee Immobilizer - Right Restrictions Weight Bearing Restrictions: No RLE Weight Bearing: Weight bearing as tolerated Mobility (including Balance) Bed Mobility Bed Mobility: No Transfers Transfers: No Ambulation/Gait Ambulation/Gait: No    Exercise  Total Joint Exercises Ankle Circles/Pumps: AROM;Both;20 reps Quad Sets: AROM;Supine;10 reps;Right Short Arc Quad: AAROM;10 reps;Supine;Right Heel Slides: AAROM;Supine;Right;10 reps Hip ABduction/ADduction: AAROM;10 reps;Right;Supine Straight Leg Raises: AAROM;Supine;Right;10 reps End of Session PT - End of Session Activity Tolerance: Patient tolerated treatment well Patient left: in bed;with call bell in reach General Behavior During Session: [facility] for tasks performed Cognition: [facility] for tasks  performed  Page, Meribeth Mattes 09/18/2011, 10:57 AM

## 2011-09-18 NOTE — Progress Notes (Signed)
CARE MANAGEMENT NOTE 09/18/2011  Patient:  [name], [name]   Account Number:  [id number]  Date Initiated:  09/14/2011  Documentation initiated by:  Colleen Can  Subjective/Objective Assessment:   DX osteoarthritis rt knee; total knee replacemnt-osteonics     Action/Plan:   CM spoke with patient. Plans are for patient to retrun to her home in [place] where she wil have caregivers for several days. States she already has DME-rw &3 N1. [name] requesting [facility] -has used in the past.   Anticipated DC Date:  09/16/2011   Anticipated DC Plan:  HOME W HOME HEALTH SERVICES  In-house referral  NA      DC Planning Services  CM consult      [facility] Choice  HOME HEALTH   Choice offered to / List presented to:  C-1 Patient   DME arranged  NA      DME agency  NA     HH arranged  HH-2 PT  HH-4 NURSE'S AIDE      HH agency  [facility]   Status of service:  Completed, signed off Medicare Important Message given?  NO (If response is "NO", the following Medicare IM given date fields will be blank) Date Medicare IM given:   Date Additional Medicare IM given:    Discharge Disposition:  HOME W HOME HEALTH SERVICES  Per UR Regulation:  REVIEWED FOR MEDICAL NECESSITY  If discussed at Long Length of Stay Meetings, dates discussed:  N/A Comments:  09/18/2011 Raynelle Bring BSN CM [phone] PT FOR DISCHARGE TODAY. [facility] IN PLACE AND NOTIFIED OF PT'S DISCHARGE. SERVICES WILL START TOMORROW 09/19/2011.

## 2011-09-18 NOTE — Progress Notes (Signed)
Patient ID: [name], female   DOB: 10-07-1940, 71 y.o.   MRN: [id number] Afebrile.  Wound dry.  hgb 8.5.  Discharge home--office 2wks from surgery.  CAD stable.  Dyland Panuco

## 2011-09-19 NOTE — Discharge Summary (Signed)
[name], [name] NO.:  [id number]  MEDICAL RECORD NO.:  [id number]  LOCATION:  1601                         FACILITY:  [facility]  PHYSICIAN:  Marlowe Kays, M.D.  DATE OF BIRTH:  [birth year]  DATE OF ADMISSION:  09/13/2011 DATE OF DISCHARGE:  09/18/2011                              DISCHARGE SUMMARY   ADMISSION DIAGNOSIS:  End-stage osteoarthritis, right knee.  DISCHARGE DIAGNOSIS:  End-stage osteoarthritis, right knee.  OPERATION:  Total knee replacement, right on September 13, 2011.  SUMMARY:  [name] is a longtime patient of mine.  She has had a successful total knee replacement on the left years ago and has had progressive pain in the right knee, unrelieved by knee arthroscopy, which demonstrated complete loss of articular cartilage in many areas and medial compartment of her knee joint.  Because of the severe pain, she elected to have total knee replacement on the right at this time. Admission laboratory data was unremarkable.  The surgery proceeded uneventfully with no complications and postoperative x-rays look good. At the time of discharge, her wound was healing nicely with no drainage. She was afebrile.  Home physical therapy had been arranged for her.  We are sending her home on her regular medications and in addition Robaxin and OxyContin and gabapentin.  DISCHARGE PLANS:  Return to my office 2 weeks from surgery, but sooner if there is a problem.          ______________________________ Marlowe Kays, M.D.     JA/MEDQ  D:  09/18/2011  T:  09/19/2011  Job:  [id number]

## 2011-09-26 ENCOUNTER — Encounter (HOSPITAL_COMMUNITY): Payer: Self-pay | Admitting: Orthopedic Surgery

## 2011-10-11 ENCOUNTER — Emergency Department (INDEPENDENT_AMBULATORY_CARE_PROVIDER_SITE_OTHER): Payer: Medicare Other

## 2011-10-11 ENCOUNTER — Emergency Department (HOSPITAL_BASED_OUTPATIENT_CLINIC_OR_DEPARTMENT_OTHER): Payer: Medicare Other

## 2011-10-11 ENCOUNTER — Inpatient Hospital Stay (HOSPITAL_BASED_OUTPATIENT_CLINIC_OR_DEPARTMENT_OTHER)
Admission: EM | Admit: 2011-10-11 | Discharge: 2011-10-14 | DRG: 069 | Disposition: A | Discharge status: Home / Self Care | Payer: Medicare Other | Attending: Internal Medicine | Admitting: Internal Medicine

## 2011-10-11 ENCOUNTER — Encounter (HOSPITAL_BASED_OUTPATIENT_CLINIC_OR_DEPARTMENT_OTHER): Payer: Self-pay | Admitting: *Deleted

## 2011-10-11 DIAGNOSIS — B952 Enterococcus as the cause of diseases classified elsewhere: Secondary | ICD-10-CM | POA: Diagnosis not present

## 2011-10-11 DIAGNOSIS — R42 Dizziness and giddiness: Secondary | ICD-10-CM

## 2011-10-11 DIAGNOSIS — F29 Unspecified psychosis not due to a substance or known physiological condition: Secondary | ICD-10-CM

## 2011-10-11 DIAGNOSIS — G609 Hereditary and idiopathic neuropathy, unspecified: Secondary | ICD-10-CM | POA: Diagnosis present

## 2011-10-11 DIAGNOSIS — R5381 Other malaise: Secondary | ICD-10-CM

## 2011-10-11 DIAGNOSIS — K219 Gastro-esophageal reflux disease without esophagitis: Secondary | ICD-10-CM | POA: Diagnosis present

## 2011-10-11 DIAGNOSIS — I517 Cardiomegaly: Secondary | ICD-10-CM

## 2011-10-11 DIAGNOSIS — Z7982 Long term (current) use of aspirin: Secondary | ICD-10-CM

## 2011-10-11 DIAGNOSIS — L0390 Cellulitis, unspecified: Secondary | ICD-10-CM

## 2011-10-11 DIAGNOSIS — Z79899 Other long term (current) drug therapy: Secondary | ICD-10-CM

## 2011-10-11 DIAGNOSIS — F3289 Other specified depressive episodes: Secondary | ICD-10-CM | POA: Diagnosis present

## 2011-10-11 DIAGNOSIS — G459 Transient cerebral ischemic attack, unspecified: Secondary | ICD-10-CM

## 2011-10-11 DIAGNOSIS — N390 Urinary tract infection, site not specified: Secondary | ICD-10-CM | POA: Diagnosis not present

## 2011-10-11 DIAGNOSIS — I10 Essential (primary) hypertension: Secondary | ICD-10-CM | POA: Diagnosis present

## 2011-10-11 DIAGNOSIS — W19XXXA Unspecified fall, initial encounter: Secondary | ICD-10-CM | POA: Diagnosis present

## 2011-10-11 DIAGNOSIS — F329 Major depressive disorder, single episode, unspecified: Secondary | ICD-10-CM | POA: Diagnosis present

## 2011-10-11 DIAGNOSIS — Z96659 Presence of unspecified artificial knee joint: Secondary | ICD-10-CM

## 2011-10-11 DIAGNOSIS — L02419 Cutaneous abscess of limb, unspecified: Secondary | ICD-10-CM

## 2011-10-11 DIAGNOSIS — M1990 Unspecified osteoarthritis, unspecified site: Secondary | ICD-10-CM | POA: Diagnosis present

## 2011-10-11 DIAGNOSIS — L03119 Cellulitis of unspecified part of limb: Secondary | ICD-10-CM

## 2011-10-11 DIAGNOSIS — I498 Other specified cardiac arrhythmias: Secondary | ICD-10-CM | POA: Diagnosis present

## 2011-10-11 DIAGNOSIS — F985 Adult onset fluency disorder: Secondary | ICD-10-CM

## 2011-10-11 DIAGNOSIS — G2581 Restless legs syndrome: Secondary | ICD-10-CM | POA: Diagnosis present

## 2011-10-11 DIAGNOSIS — M25469 Effusion, unspecified knee: Secondary | ICD-10-CM

## 2011-10-11 LAB — COMPREHENSIVE METABOLIC PANEL
Alkaline Phosphatase: 114 U/L (ref 39–117)
BUN: 12 mg/dL (ref 6–23)
CO2: 28 mEq/L (ref 19–32)
Chloride: 99 mEq/L (ref 96–112)
Creatinine, Ser: 0.5 mg/dL (ref 0.50–1.10)
GFR calc Af Amer: >90 mL/min (ref >90)
GFR calc non Af Amer: >90 mL/min (ref >90)
Glucose, Bld: 123 mg/dL — ABNORMAL HIGH (ref 70–99)
Potassium: 4.3 mEq/L (ref 3.5–5.1)
Total Bilirubin: 0.4 mg/dL (ref 0.3–1.2)

## 2011-10-11 LAB — CBC
HCT: 34.7 % — ABNORMAL LOW (ref 36.0–46.0)
Hemoglobin: 11.6 g/dL — ABNORMAL LOW (ref 12.0–15.0)
MCHC: 33.4 g/dL (ref 30.0–36.0)
RBC: 3.35 MIL/uL — ABNORMAL LOW (ref 3.87–5.11)

## 2011-10-11 LAB — DIFFERENTIAL
Basophils Relative: 0 % (ref 0–1)
Lymphs Abs: 1.7 10*3/uL (ref 0.7–4.0)
Monocytes Absolute: 0.9 10*3/uL (ref 0.1–1.0)
Monocytes Relative: 13 % — ABNORMAL HIGH (ref 3–12)
Neutro Abs: 4.2 10*3/uL (ref 1.7–7.7)
Neutrophils Relative %: 62 % (ref 43–77)

## 2011-10-11 LAB — URINALYSIS, ROUTINE W REFLEX MICROSCOPIC
Glucose, UA: NEGATIVE mg/dL
Ketones, ur: 15 mg/dL — AB
Nitrite: NEGATIVE
Protein, ur: NEGATIVE mg/dL
Urobilinogen, UA: 0.2 mg/dL (ref 0.0–1.0)

## 2011-10-11 LAB — URINE MICROSCOPIC-ADD ON

## 2011-10-11 LAB — TROPONIN I: Troponin I: <0.3 ng/mL (ref <0.30)

## 2011-10-11 MED ORDER — ONDANSETRON HCL 4 MG/2ML IJ SOLN
4.0000 mg | Freq: Once | INTRAMUSCULAR | Status: AC
  Administered 2011-10-11: 4 mg via INTRAVENOUS

## 2011-10-11 MED ORDER — VANCOMYCIN HCL IN DEXTROSE 1-5 GM/200ML-% IV SOLN
1000.0000 mg | Freq: Once | INTRAVENOUS | Status: AC
  Administered 2011-10-11: 1000 mg via INTRAVENOUS
  Filled 2011-10-11: qty 200

## 2011-10-11 MED ORDER — OXYCODONE-ACETAMINOPHEN 5-325 MG PO TABS
2.0000 | ORAL_TABLET | Freq: Once | ORAL | Status: AC
  Administered 2011-10-11: 2 via ORAL

## 2011-10-11 MED ORDER — IBUPROFEN 400 MG PO TABS
600.0000 mg | ORAL_TABLET | Freq: Once | ORAL | Status: AC
  Administered 2011-10-11: 600 mg via ORAL
  Filled 2011-10-11: qty 1

## 2011-10-11 MED ORDER — ONDANSETRON HCL 4 MG/2ML IJ SOLN
INTRAMUSCULAR | Status: AC
  Administered 2011-10-11: 4 mg via INTRAVENOUS
  Filled 2011-10-11: qty 2

## 2011-10-11 MED ORDER — OXYCODONE-ACETAMINOPHEN 5-325 MG PO TABS
ORAL_TABLET | ORAL | Status: AC
  Administered 2011-10-11: 2 via ORAL
  Filled 2011-10-11: qty 2

## 2011-10-11 NOTE — ED Provider Notes (Signed)
History     CSN: [id number]  Arrival date & time 10/11/11  1519   First MD Initiated Contact with Patient 10/11/11 1543      No chief complaint on file.   (Consider location/radiation/quality/duration/timing/severity/associated sxs/prior treatment) The history is provided by the patient and a relative.   Patient here with dizziness and weakness and falls x2 days. Family has noticed some slurred speech with stuttering that seems to get worse when patient's try to find her words. No headache or fever. Some vomiting x2. Patient gets weak when she attempts to stand and has right knee pain associated with this. History of knee surgery 3 weeks ago. Denies any chest or abdominal pain. Denies any dyspnea. Symptoms of seen to be worse with activity. History of tachyarrhythmias Past Medical History  Diagnosis Date  . Hypertension   . GERD (gastroesophageal reflux disease)     hx pud '98  . Arthritis     djd  . Restless leg syndrome   . 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 Dysrhythmia     hx tachy palpitations  . Headache     remote hx of migraines  . Neuromuscular disorder     peripheral neuropathy, FEET AND LEGS  . Depression     bh admission '09    Past Surgical History  Procedure Date  . Cervical disc surgery x2 2011  . Rt carotid enarterectomy 2011  . Rt knee arthroscopy '99  . Left knee arthroscopy 2001  . Hematoma evacuation 2011    s/p rt cea  . Joint replacement '01    total knee replacement, LEFT  . Abdominal hysterectomy 1975    bil oophorectomy  . Back surgery  MANY YRS AGO    laminectomy x3  . Cholecystectomy 1993  . Total knee arthroplasty 09/13/2011    Procedure: TOTAL KNEE ARTHROPLASTY;  Surgeon: Drucilla Schmidt, MD;  Location: [facility];  Service: Orthopedics;  Laterality: Right;    No family history on file.  History  Substance Use Topics  . Smoking status: Never Smoker   . Smokeless tobacco: Never Used  . Alcohol Use: No    OB  History    Grav Para Term Preterm Abortions TAB SAB Ect Mult Living                  Review of Systems  All other systems reviewed and are negative.    Allergies  Penicillins and Sulfa drugs cross reactors  Home Medications   Current Outpatient Rx  Name Route Sig Dispense Refill  . ACETAMINOPHEN 500 MG PO TABS Oral Take 500 mg by mouth every 6 (six) hours as needed. Pain    . AMLODIPINE BESYLATE 10 MG PO TABS Oral Take 10 mg by mouth at bedtime.    . ATORVASTATIN CALCIUM 20 MG PO TABS Oral Take 20 mg by mouth at bedtime.    Marland Kitchen CALCIUM CARBONATE 1250 MG PO CAPS Oral Take 1,250 mg by mouth daily.     Marland Kitchen CARBIDOPA-LEVODOPA ER 50-200 MG PO TBCR Oral Take 1 tablet by mouth at bedtime.    Marland Kitchen CARBIDOPA-LEVODOPA 25-250 MG PO TABS Oral Take 1 tablet by mouth 3 (three) times daily.    Marland Kitchen GABAPENTIN 800 MG PO TABS Oral Take 800 mg by mouth 4 (four) times daily.    Marland Kitchen METHOCARBAMOL 500 MG PO TABS Oral Take 500 mg by mouth every 6 (six) hours as needed. Muscle spasm     .  VITAMIN D 1000 UNITS PO TABS Oral Take 1,000 Units by mouth daily.    Marland Kitchen FLUOXETINE HCL 40 MG PO CAPS Oral Take 40 mg by mouth at bedtime.     Marland Kitchen MIRTAZAPINE 15 MG PO TABS Oral Take 15 mg by mouth at bedtime.    Marland Kitchen VITAMIN B-6 50 MG PO TABS Oral Take 50 mg by mouth daily.    Marland Kitchen RIVAROXABAN 10 MG PO TABS Oral Take 1 tablet (10 mg total) by mouth daily. 12 tablet 0    BP 151/73  Pulse 120  Temp(Src) 98.5 F (36.9 C) (Oral)  Resp 20  Ht 5\' 5"  (1.651 m)  Wt 190 lb (86.183 kg)  BMI 31.62 kg/m2  SpO2 97%  Physical Exam  Nursing note and vitals reviewed. Constitutional: [name] oriented to person, place, and time. She appears well-developed and well-nourished.  Non-toxic appearance. No distress.  HENT:  Head: Normocephalic and atraumatic.  Eyes: Conjunctivae, EOM and lids are normal. Pupils are equal, round, and reactive to light.  Neck: Normal range of motion. Neck supple. No tracheal deviation present. No mass present.    Cardiovascular: Regular rhythm and normal heart sounds.  Tachycardia present.  Exam reveals no gallop.   No murmur heard. Pulmonary/Chest: Effort normal and breath sounds normal. No stridor. No respiratory distress. She has no decreased breath sounds. She has no wheezes. She has no rhonchi. She has no rales.  Abdominal: Soft. Normal appearance and bowel sounds are normal. She exhibits no distension. There is no tenderness. There is no rebound and no CVA tenderness.  Musculoskeletal: Normal range of motion. She exhibits no edema and no tenderness.       Right knee with erythema around the incision site without drainage with red streaks extending up her right thigh  Neurological: [name] alert and oriented to person, place, and time. She has normal strength. No cranial nerve deficit or sensory deficit. GCS eye subscore is 4. GCS verbal subscore is 5. GCS motor subscore is 6.  Skin: Skin is warm and dry. No abrasion and no rash noted.  Psychiatric: She has a normal mood and affect. Her speech is normal and behavior is normal.    ED Course  Procedures (including critical care time)   Labs Reviewed  CBC  DIFFERENTIAL  COMPREHENSIVE METABOLIC PANEL  URINALYSIS, ROUTINE W REFLEX MICROSCOPIC  URINE CULTURE   No results found.   No diagnosis found.    MDM   Date: 10/11/2011  Rate: 117  Rhythm: sinus tachycardia  QRS Axis: normal  Intervals: normal  ST/T Wave abnormalities: nonspecific ST/T changes  Conduction Disutrbances:none  Narrative Interpretation:   Old EKG Reviewed: none available  Patient given IV fluids here and pain medication. Started on vancomycin for cellulitis. Spoke with triad hospitalist and she will be admitted. No criteria for to stroke. Patient with possible TIAs.          Toy Baker, MD 10/11/11 [phone]

## 2011-10-11 NOTE — ED Notes (Signed)
Called [facility] to give room number of 3012-1 also notified RN

## 2011-10-11 NOTE — ED Notes (Signed)
Attempted to call report to RN on 3000, unable to take report at this time, will try again

## 2011-10-11 NOTE — ED Notes (Signed)
Pain in her right knee. Had surgery on her knee a month ago. Knee is swollen, red, painful, and hot to touch. She has fallen 2 times this am. Dizziness, slurred speech this am. Pale on arrival to triage.

## 2011-10-11 NOTE — ED Notes (Signed)
Report given to RN anna lomeraux on 3000

## 2011-10-12 ENCOUNTER — Encounter (HOSPITAL_COMMUNITY): Payer: Self-pay | Admitting: Internal Medicine

## 2011-10-12 ENCOUNTER — Inpatient Hospital Stay (HOSPITAL_COMMUNITY): Payer: Medicare Other

## 2011-10-12 DIAGNOSIS — G459 Transient cerebral ischemic attack, unspecified: Secondary | ICD-10-CM

## 2011-10-12 DIAGNOSIS — I517 Cardiomegaly: Secondary | ICD-10-CM

## 2011-10-12 DIAGNOSIS — R42 Dizziness and giddiness: Secondary | ICD-10-CM

## 2011-10-12 DIAGNOSIS — L02419 Cutaneous abscess of limb, unspecified: Secondary | ICD-10-CM

## 2011-10-12 DIAGNOSIS — L03119 Cellulitis of unspecified part of limb: Secondary | ICD-10-CM

## 2011-10-12 DIAGNOSIS — F29 Unspecified psychosis not due to a substance or known physiological condition: Secondary | ICD-10-CM

## 2011-10-12 DIAGNOSIS — L0390 Cellulitis, unspecified: Secondary | ICD-10-CM

## 2011-10-12 DIAGNOSIS — K219 Gastro-esophageal reflux disease without esophagitis: Secondary | ICD-10-CM | POA: Diagnosis present

## 2011-10-12 DIAGNOSIS — W19XXXA Unspecified fall, initial encounter: Secondary | ICD-10-CM | POA: Diagnosis present

## 2011-10-12 LAB — LIPID PANEL
Total CHOL/HDL Ratio: 5.7 RATIO
VLDL: 73 mg/dL — ABNORMAL HIGH (ref 0–40)

## 2011-10-12 LAB — RAPID URINE DRUG SCREEN, HOSP PERFORMED
Barbiturates: NOT DETECTED
Cocaine: NOT DETECTED
Opiates: UNDETERMINED

## 2011-10-12 LAB — CBC
HCT: 32.6 % — ABNORMAL LOW (ref 36.0–46.0)
MCV: 107.9 fL — ABNORMAL HIGH (ref 78.0–100.0)
Platelets: 273 10*3/uL (ref 150–400)
RBC: 3.02 MIL/uL — ABNORMAL LOW (ref 3.87–5.11)
WBC: 4.9 10*3/uL (ref 4.0–10.5)

## 2011-10-12 MED ORDER — ATORVASTATIN CALCIUM 20 MG PO TABS
20.0000 mg | ORAL_TABLET | Freq: Every day | ORAL | Status: DC
  Administered 2011-10-12 – 2011-10-13 (×2): 20 mg via ORAL
  Filled 2011-10-12 (×3): qty 1

## 2011-10-12 MED ORDER — ASPIRIN 81 MG PO CHEW
81.0000 mg | CHEWABLE_TABLET | Freq: Every day | ORAL | Status: DC
  Filled 2011-10-12: qty 1

## 2011-10-12 MED ORDER — GABAPENTIN 400 MG PO CAPS
800.0000 mg | ORAL_CAPSULE | Freq: Four times a day (QID) | ORAL | Status: DC
  Administered 2011-10-12 – 2011-10-14 (×6): 800 mg via ORAL
  Filled 2011-10-12 (×9): qty 2

## 2011-10-12 MED ORDER — PANTOPRAZOLE SODIUM 40 MG PO TBEC
40.0000 mg | DELAYED_RELEASE_TABLET | Freq: Every day | ORAL | Status: DC
  Administered 2011-10-12 – 2011-10-13 (×2): 40 mg via ORAL
  Filled 2011-10-12 (×3): qty 1

## 2011-10-12 MED ORDER — SODIUM CHLORIDE 0.9 % IV SOLN
INTRAVENOUS | Status: DC
  Administered 2011-10-12: 100 mL/h via INTRAVENOUS
  Administered 2011-10-12 – 2011-10-13 (×2): via INTRAVENOUS

## 2011-10-12 MED ORDER — ENOXAPARIN SODIUM 40 MG/0.4ML ~~LOC~~ SOLN: 40.0000 mg | SUBCUTANEOUS | Status: DC

## 2011-10-12 MED ORDER — ASPIRIN EC 325 MG PO TBEC
325.0000 mg | DELAYED_RELEASE_TABLET | Freq: Every day | ORAL | Status: DC
  Administered 2011-10-12 – 2011-10-13 (×2): 325 mg via ORAL
  Filled 2011-10-12 (×4): qty 1

## 2011-10-12 MED ORDER — CARBIDOPA-LEVODOPA ER 50-200 MG PO TBCR
1.0000 | EXTENDED_RELEASE_TABLET | Freq: Every day | ORAL | Status: DC
  Administered 2011-10-12 – 2011-10-13 (×2): 1 via ORAL
  Filled 2011-10-12 (×3): qty 1

## 2011-10-12 MED ORDER — CALCIUM CARBONATE 1250 (500 CA) MG PO TABS
1250.0000 mg | ORAL_TABLET | Freq: Every day | ORAL | Status: DC
  Administered 2011-10-12 – 2011-10-14 (×3): 1250 mg via ORAL
  Filled 2011-10-12 (×3): qty 1

## 2011-10-12 MED ORDER — GABAPENTIN 800 MG PO TABS
800.0000 mg | ORAL_TABLET | Freq: Four times a day (QID) | ORAL | Status: DC
  Administered 2011-10-12 (×2): 800 mg via ORAL
  Filled 2011-10-12 (×5): qty 1

## 2011-10-12 MED ORDER — MIRTAZAPINE 15 MG PO TABS
15.0000 mg | ORAL_TABLET | Freq: Every day | ORAL | Status: DC
  Administered 2011-10-12 – 2011-10-13 (×2): 15 mg via ORAL
  Filled 2011-10-12 (×5): qty 1

## 2011-10-12 MED ORDER — RIVAROXABAN 10 MG PO TABS
10.0000 mg | ORAL_TABLET | ORAL | Status: DC
  Administered 2011-10-12 – 2011-10-14 (×3): 10 mg via ORAL
  Filled 2011-10-12 (×4): qty 1

## 2011-10-12 MED ORDER — SIMVASTATIN 20 MG PO TABS
20.0000 mg | ORAL_TABLET | Freq: Every day | ORAL | Status: DC
  Administered 2011-10-12: 20 mg via ORAL
  Filled 2011-10-12: qty 1

## 2011-10-12 MED ORDER — AMLODIPINE BESYLATE 10 MG PO TABS
10.0000 mg | ORAL_TABLET | Freq: Every day | ORAL | Status: DC
  Administered 2011-10-12 – 2011-10-13 (×2): 10 mg via ORAL
  Filled 2011-10-12 (×3): qty 1

## 2011-10-12 MED ORDER — FLUOXETINE HCL 20 MG PO CAPS
40.0000 mg | ORAL_CAPSULE | Freq: Every day | ORAL | Status: DC
  Administered 2011-10-12 – 2011-10-13 (×2): 40 mg via ORAL
  Filled 2011-10-12 (×3): qty 2

## 2011-10-12 MED ORDER — VITAMIN D3 25 MCG (1000 UNIT) PO TABS
1000.0000 [IU] | ORAL_TABLET | Freq: Every day | ORAL | Status: DC
  Administered 2011-10-12 – 2011-10-14 (×3): 1000 [IU] via ORAL
  Filled 2011-10-12 (×3): qty 1

## 2011-10-12 MED ORDER — VITAMIN B-6 50 MG PO TABS
50.0000 mg | ORAL_TABLET | Freq: Every day | ORAL | Status: DC
  Administered 2011-10-12 – 2011-10-14 (×3): 50 mg via ORAL
  Filled 2011-10-12 (×3): qty 1

## 2011-10-12 MED ORDER — ACETAMINOPHEN 500 MG PO TABS
500.0000 mg | ORAL_TABLET | ORAL | Status: DC | PRN
Start: 2011-10-12 — End: 2011-10-12

## 2011-10-12 MED ORDER — METHOCARBAMOL 500 MG PO TABS
500.0000 mg | ORAL_TABLET | Freq: Four times a day (QID) | ORAL | Status: DC | PRN
  Administered 2011-10-12 – 2011-10-14 (×9): 500 mg via ORAL
  Filled 2011-10-12 (×10): qty 1

## 2011-10-12 MED ORDER — CARBIDOPA-LEVODOPA 25-250 MG PO TABS
1.0000 | ORAL_TABLET | Freq: Three times a day (TID) | ORAL | Status: DC
  Administered 2011-10-12 – 2011-10-14 (×7): 1 via ORAL
  Filled 2011-10-12 (×11): qty 1

## 2011-10-12 MED ORDER — ACETAMINOPHEN 500 MG PO TABS
500.0000 mg | ORAL_TABLET | Freq: Four times a day (QID) | ORAL | Status: DC | PRN
  Administered 2011-10-12 – 2011-10-13 (×3): 500 mg via ORAL
  Filled 2011-10-12 (×3): qty 1

## 2011-10-12 MED ORDER — OXYCODONE HCL 5 MG PO TABS: 5.0000 mg | ORAL_TABLET | ORAL | Status: DC | PRN

## 2011-10-12 MED ORDER — ACETAMINOPHEN 325 MG PO TABS: 650.0000 mg | ORAL_TABLET | ORAL | Status: DC | PRN

## 2011-10-12 MED ORDER — OXYCODONE-ACETAMINOPHEN 5-325 MG PO TABS
1.0000 | ORAL_TABLET | ORAL | Status: DC | PRN
  Administered 2011-10-12 – 2011-10-14 (×14): 1 via ORAL
  Filled 2011-10-12 (×15): qty 1

## 2011-10-12 MED ORDER — VANCOMYCIN HCL IN DEXTROSE 1-5 GM/200ML-% IV SOLN
1000.0000 mg | Freq: Two times a day (BID) | INTRAVENOUS | Status: DC
  Administered 2011-10-12 – 2011-10-13 (×3): 1000 mg via INTRAVENOUS
  Filled 2011-10-12 (×4): qty 200

## 2011-10-12 MED ORDER — OXYCODONE-ACETAMINOPHEN 5-325 MG PO TABS: 1.0000 | ORAL_TABLET | ORAL | Status: DC | PRN

## 2011-10-12 NOTE — H&P (Signed)
[name] is an 71 y.o. female.   Chief Complaint: Fall HPI: 71 year old female status post recent right knee surgery 3 weeks ago who came from home 6 after experiencing some dizziness and falling down. Patient has been having speech changes and also shaking of her right upper extremity. [name] also noticed worsening redness pain and mild swelling of her right knee where she had surgery recently. She denied fever or chills, denied recent nausea vomiting or diarrhea. She has had some reflux symptoms otherwise she has not had any seizures since she got home. Patient was seen at [facility] [place] and her speech has returned to normal by then.  Past Medical History  Diagnosis Date  . Hypertension   . GERD (gastroesophageal reflux disease)     hx pud '98  . Arthritis     djd  . Restless leg syndrome   . 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 Dysrhythmia     hx tachy palpitations  . Headache     remote hx of migraines  . Neuromuscular disorder     peripheral neuropathy, FEET AND LEGS  . Depression     bh admission '09    Past Surgical History  Procedure Date  . Cervical disc surgery x2 2011  . Rt carotid enarterectomy 2011  . Rt knee arthroscopy '99  . Left knee arthroscopy 2001  . Hematoma evacuation 2011    s/p rt cea  . Joint replacement '01    total knee replacement, LEFT  . Abdominal hysterectomy 1975    bil oophorectomy  . Back surgery  MANY YRS AGO    laminectomy x3  . Cholecystectomy 1993  . 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 No pertinent family history. Social History:  reports that she has never smoked. She has never used smokeless tobacco. She reports that she does not drink alcohol or use illicit drugs.  Allergies:  Allergies  Allergen Reactions  . Penicillins Itching  . Sulfa Drugs Cross  Reactors Nausea And Vomiting    Medications Prior to Admission  Medication Sig Dispense Refill  . acetaminophen (TYLENOL) 500 MG tablet Take 500 mg by mouth every 6 (six) hours as needed. Pain      . amLODipine (NORVASC) 10 MG tablet Take 10 mg by mouth at bedtime.      Marland Kitchen aspirin 81 MG chewable tablet Chew 81 mg by mouth daily.      Marland Kitchen atorvastatin (LIPITOR) 20 MG tablet Take 20 mg by mouth at bedtime.      . calcium carbonate 1250 MG capsule Take 1,250 mg by mouth daily.       . carbidopa-levodopa (SINEMET CR) 50-200 MG per tablet Take 1 tablet by mouth at bedtime.      . carbidopa-levodopa (SINEMET) 25-250 MG per tablet Take 1 tablet by mouth 3 (three) times daily.      . cholecalciferol (VITAMIN D) 1000 UNITS tablet Take 1,000 Units by mouth daily.      Marland Kitchen FLUoxetine (PROZAC) 40 MG capsule Take 40 mg by mouth at bedtime.       . gabapentin (NEURONTIN) 800 MG tablet Take 800 mg by mouth 4 (four) times daily.      Marland Kitchen HYDROcodone-acetaminophen (VICODIN) 5-500 MG per tablet Take 1 tablet by mouth  every 6 (six) hours as needed. For pain      . methocarbamol (ROBAXIN) 500 MG tablet Take 500 mg by mouth every 6 (six) hours as needed. Muscle spasm       . mirtazapine (REMERON) 15 MG tablet Take 15 mg by mouth at bedtime.      Marland Kitchen oxycodone-acetaminophen (ROXICET) 5-500 MG per tablet Take 1 tablet by mouth every 4 (four) hours as needed. For pain      . pyridOXINE (VITAMIN B-6) 50 MG tablet Take 50 mg by mouth daily.        Results for orders placed during the hospital encounter of 10/11/11 (from the past 48 hour(s))  CBC     Status: Abnormal   Collection Time   10/11/11  4:05 PM      Component Value Range Comment   WBC 6.8  4.0 - 10.5 (K/uL)    RBC 3.35 (*) 3.87 - 5.11 (MIL/uL)    Hemoglobin 11.6 (*) 12.0 - 15.0 (g/dL)    HCT 74.2 (*) 59.5 - 46.0 (%)    MCV 103.6 (*) 78.0 - 100.0 (fL)    MCH 34.6 (*) 26.0 - 34.0 (pg)    MCHC 33.4  30.0 - 36.0 (g/dL)    RDW 63.8 (*) 75.6 - 15.5 (%)     Platelets 306  150 - 400 (K/uL)   DIFFERENTIAL     Status: Abnormal   Collection Time   10/11/11  4:05 PM      Component Value Range Comment   Neutrophils Relative 62  43 - 77 (%)    Neutro Abs 4.2  1.7 - 7.7 (K/uL)    Lymphocytes Relative 24  12 - 46 (%)    Lymphs Abs 1.7  0.7 - 4.0 (K/uL)    Monocytes Relative 13 (*) 3 - 12 (%)    Monocytes Absolute 0.9  0.1 - 1.0 (K/uL)    Eosinophils Relative 1  0 - 5 (%)    Eosinophils Absolute 0.1  0.0 - 0.7 (K/uL)    Basophils Relative 0  0 - 1 (%)    Basophils Absolute 0.0  0.0 - 0.1 (K/uL)   COMPREHENSIVE METABOLIC PANEL     Status: Abnormal   Collection Time   10/11/11  4:05 PM      Component Value Range Comment   Sodium 138  135 - 145 (mEq/L)    Potassium 4.3  3.5 - 5.1 (mEq/L)    Chloride 99  96 - 112 (mEq/L)    CO2 28  19 - 32 (mEq/L)    Glucose, Bld 123 (*) 70 - 99 (mg/dL)    BUN 12  6 - 23 (mg/dL)    Creatinine, Ser 4.33  0.50 - 1.10 (mg/dL)    Calcium 9.8  8.4 - 10.5 (mg/dL)    Total Protein 7.4  6.0 - 8.3 (g/dL)    Albumin 3.8  3.5 - 5.2 (g/dL)    AST 14  0 - 37 (U/L)    ALT <5  0 - 35 (U/L)    Alkaline Phosphatase 114  39 - 117 (U/L)    Total Bilirubin 0.4  0.3 - 1.2 (mg/dL)    GFR calc non Af Amer >90  >90 (mL/min)    GFR calc Af Amer >90  >90 (mL/min)   TROPONIN I     Status: Normal   Collection Time   10/11/11  4:05 PM      Component Value Range Comment   Troponin I <  0.30  <0.30 (ng/mL)   URINALYSIS, ROUTINE W REFLEX MICROSCOPIC     Status: Abnormal   Collection Time   10/11/11  4:20 PM      Component Value Range Comment   Color, Urine YELLOW  YELLOW     APPearance CLEAR  CLEAR     Specific Gravity, Urine 1.023  1.005 - 1.030     pH 6.5  5.0 - 8.0     Glucose, UA NEGATIVE  NEGATIVE (mg/dL)    Hgb urine dipstick NEGATIVE  NEGATIVE     Bilirubin Urine NEGATIVE  NEGATIVE     Ketones, ur 15 (*) NEGATIVE (mg/dL)    Protein, ur NEGATIVE  NEGATIVE (mg/dL)    Urobilinogen, UA 0.2  0.0 - 1.0 (mg/dL)    Nitrite NEGATIVE   NEGATIVE     Leukocytes, UA TRACE (*) NEGATIVE    URINE MICROSCOPIC-ADD ON     Status: Normal   Collection Time   10/11/11  4:20 PM      Component Value Range Comment   Squamous Epithelial / LPF RARE  RARE     WBC, UA 0-2  <3 (WBC/hpf)    Bacteria, UA RARE  RARE     Dg Chest 2 View  10/11/2011  *RADIOLOGY REPORT*  Clinical Data: Dizziness.  Generalized weakness.  CHEST - 2 VIEW 10/11/2011:  Comparison: Two-view chest x-ray 02/06/2011 [facility], 11/05/2009 [facility] [place].  Findings: Cardiac silhouette enlarged but stable.  Hilar and mediastinal contours otherwise unremarkable.   Lungs clear. Bronchovascular markings normal.  Pulmonary vascularity normal.  No pneumothorax.  No pleural effusions.  Stable chronic elevation of the right hemidiaphragm.  No significant interval change.  IMPRESSION: Stable cardiomegaly.  No acute cardiopulmonary disease.  Original Report Authenticated By: Arnell Sieving, M.D.   Ct Head Wo Contrast  10/11/2011  *RADIOLOGY REPORT*  Clinical Data: Dizziness.  Stuttering.  CT HEAD WITHOUT CONTRAST 10/11/2011:  Technique:  Contiguous axial images were obtained from the base of the skull through the vertex without contrast.  Comparison: Unenhanced cranial CT 11/22/2009, 11/05/2009.  MRI brain 11/06/2009.  Findings: Ventricular system normal in size and appearance for age. Mild changes of small vessel disease of the hemispheric white matter is less apparent than on the prior MR.  No mass lesion.  No midline shift.  No acute hemorrhage or hematoma.  No extra-axial fluid collections.  No evidence of acute infarction.  No significant interval change.  Mild changes of hyperostosis frontalis interna. Visualized paranasal sinuses, mastoid air cells, and middle ear cavities well- aerated.  Moderate bilateral carotid siphon and mild left vertebral artery atherosclerosis.  IMPRESSION:  1.  No acute intracranial abnormality. 2.  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  Stable examination.  Original Report Authenticated By: Arnell Sieving, M.D.   Dg Knee Complete 4 Views Right  10/11/2011  *RADIOLOGY REPORT*  Clinical Data: Right knee pain following replacement April 2013.  RIGHT KNEE - COMPLETE 4+ VIEW  Comparison: Postoperative films of the right knee 09/13/2011.  Findings: The patient is status post right total knee arthroplasty. The knee is located.  The femoral and tibial components are well seated.  A small joint effusion is present.  IMPRESSION:  1.  Small joint effusion. 2.  No acute or focal osseous abnormality.  Original Report Authenticated By: Jamesetta Orleans. MATTERN, M.D.    Review of Systems  Constitutional: Negative.   HENT: Negative.   Eyes:  Negative.   Respiratory: Negative.   Cardiovascular: Positive for leg swelling.  Gastrointestinal: Negative.   Genitourinary: Negative.   Musculoskeletal: Positive for joint pain and falls.  Skin: Negative.   Neurological: Positive for dizziness and speech change.  Endo/Heme/Allergies: Negative.   Psychiatric/Behavioral: Negative.     Blood pressure 122/52, pulse 115, temperature 98.6 F (37 C), temperature source Oral, resp. rate 20, height 5\' 5"  (1.651 m), weight 102.3 kg (225 lb 8.5 oz), SpO2 93.00%. Physical Exam  Constitutional: [name] oriented to person, place, and time. She appears well-developed and well-nourished.  HENT:  Head: Normocephalic and atraumatic.  Right Ear: External ear normal.  Left Ear: External ear normal.  Nose: Nose normal.  Mouth/Throat: Oropharynx is clear and moist.  Eyes: Conjunctivae and EOM are normal. Pupils are equal, round, and reactive to light.  Neck: Normal range of motion. Neck supple.  Cardiovascular: Normal rate, regular rhythm, normal heart sounds and intact distal pulses.   Respiratory: Effort normal and breath sounds normal.  GI: Soft. Bowel sounds are normal.  Musculoskeletal: She exhibits  edema and tenderness.       Right knee: She exhibits effusion, ecchymosis and erythema. tenderness found.  Neurological: [name] alert and oriented to person, place, and time. She has normal reflexes.  Skin: Skin is warm and dry.  Psychiatric: She has a normal mood and affect. Her behavior is normal. Judgment and thought content normal.     Assessment/Plan Assessment this is a 71 year old female admitted with falling possibly TIA as well as a right knee cellulitis. Patient will also have syncope. But the changes in her speech is worrisome for possible TIA. Plan #1 TIA: Patient will be admitted for father workup. She cannot have an MRI due to some nails in her spine as well as her recent knee replacement surgery. We will get carotid Dopplers echocardiogram. Consider repeat CT head after 48 hours. Will be placed on aspirin and statin and by the time she leaves a hospital. We'll get PT OT to evaluate the patient. #2 cellulitis: The patient is on IV vancomycin. Blood cultures and adjust antibiotics if needed #3 hypertension: Blood pressure seems reasonably controlled we will continue with her current medication #4 GERD: Continue with a PPI #5 recent surgery: We will keep her on DVT prophylaxis and will notify orthopedics the patient's admission.   Roth Ress,LAWAL 10/12/2011, 5:45 AM

## 2011-10-12 NOTE — Evaluation (Signed)
Speech Language Pathology Evaluation Patient Details Name: [name] MRN: [id number] DOB: March 23, 1941 Today's Date: 10/12/2011 Time: 1478-2956 SLP Time Calculation (min): 13 min  Problem List:  Patient Active Problem List  Diagnoses  . Dizziness  . Fall  . TIA (transient ischemic attack)  . Cellulitis  . HTN (hypertension)  . GERD (gastroesophageal reflux disease)   Past Medical History:  Past Medical History  Diagnosis Date  . Hypertension   . GERD (gastroesophageal reflux disease)     hx pud '98  . Arthritis     djd  . Restless leg syndrome   . 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 Dysrhythmia     hx tachy palpitations  . Headache     remote hx of migraines  . Neuromuscular disorder     peripheral neuropathy, FEET AND LEGS  . Depression     bh admission '09   Past Surgical History:  Past Surgical History  Procedure Date  . Cervical disc surgery x2 2011  . Rt carotid enarterectomy 2011  . Rt knee arthroscopy '99  . Left knee arthroscopy 2001  . Hematoma evacuation 2011    s/p rt cea  . Joint replacement '01    total knee replacement, LEFT  . Abdominal hysterectomy 1975    bil oophorectomy  . Back surgery  MANY YRS AGO    laminectomy x3  . Cholecystectomy 1993  . Total knee arthroplasty 09/13/2011    Procedure: TOTAL KNEE ARTHROPLASTY;  Surgeon: Drucilla Schmidt, MD;  Location: [facility];  Service: Orthopedics;  Laterality: Right;    Assessment / Plan / Recommendation Clinical Impression  Demonstrates very mild and inconsistent verbal expressive dysfluencies at conversational level with mild halts, hesitations and stuter-like qualities.  Patient is a highly functional communicator despite these impairment and she is able to self-correct independently.      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  Written Expression Dominant Hand: Right Written Expression: Within Functional Limits   Oral / Motor Oral Motor/Sensory Function Overall Oral Motor/Sensory Function: Appears within functional limits for tasks assessed Motor Speech Overall Motor Speech: Appears within functional limits for tasks assessed     Myra Rude, M.S.,CCC-SLP Pager [phone] 10/12/2011, 12:17 PM

## 2011-10-12 NOTE — Progress Notes (Signed)
ANTIBIOTIC CONSULT NOTE - INITIAL  Pharmacy Consult for vancomycin Indication: cellulitis  Allergies  Allergen Reactions  . Penicillins Itching  . Sulfa Drugs Cross Reactors Nausea And Vomiting    Patient Measurements: Height: 5\' 5"  (165.1 cm) Weight: 225 lb 8.5 oz (102.3 kg) IBW/kg (Calculated) : 57   Vital Signs: Temp: 98.5 F (36.9 C) (05/10 0037) Temp src: Oral (05/09 2230) BP: 135/58 mmHg (05/09 2230) Pulse Rate: 111  (05/09 2230)  Labs:  Basename 10/11/11 1605  WBC 6.8  HGB 11.6*  PLT 306  LABCREA --  CREATININE 0.50   Estimated Creatinine Clearance: 78.7 ml/min (by C-G formula based on Cr of 0.5).  Microbiology: Recent Results (from the past 720 hour(s))  SURGICAL PCR SCREEN     Status: Normal   Collection Time   09/13/11 10:00 AM      Component Value Range Status Comment   MRSA, PCR NEGATIVE  NEGATIVE  Final    Staphylococcus aureus NEGATIVE  NEGATIVE  Final     Medical History: Past Medical History  Diagnosis Date  . Hypertension   . GERD (gastroesophageal reflux disease)     hx pud '98  . Arthritis     djd  . Restless leg syndrome   . 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 Dysrhythmia     hx tachy palpitations  . Headache     remote hx of migraines  . Neuromuscular disorder     peripheral neuropathy, FEET AND LEGS  . Depression     bh admission '09    Medications:  Prescriptions prior to admission  Medication Sig Dispense Refill  . acetaminophen (TYLENOL) 500 MG tablet Take 500 mg by mouth every 6 (six) hours as needed. Pain      . amLODipine (NORVASC) 10 MG tablet Take 10 mg by mouth at bedtime.      Marland Kitchen aspirin 81 MG chewable tablet Chew 81 mg by mouth daily.      Marland Kitchen atorvastatin (LIPITOR) 20 MG tablet Take 20 mg by mouth at bedtime.      . calcium carbonate 1250 MG capsule Take 1,250 mg by mouth daily.       . carbidopa-levodopa (SINEMET CR) 50-200 MG per tablet Take 1 tablet by mouth at bedtime.      . carbidopa-levodopa  (SINEMET) 25-250 MG per tablet Take 1 tablet by mouth 3 (three) times daily.      . cholecalciferol (VITAMIN D) 1000 UNITS tablet Take 1,000 Units by mouth daily.      Marland Kitchen FLUoxetine (PROZAC) 40 MG capsule Take 40 mg by mouth at bedtime.       . gabapentin (NEURONTIN) 800 MG tablet Take 800 mg by mouth 4 (four) times daily.      Marland Kitchen HYDROcodone-acetaminophen (VICODIN) 5-500 MG per tablet Take 1 tablet by mouth every 6 (six) hours as needed. For pain      . methocarbamol (ROBAXIN) 500 MG tablet Take 500 mg by mouth every 6 (six) hours as needed. Muscle spasm       . mirtazapine (REMERON) 15 MG tablet Take 15 mg by mouth at bedtime.      Marland Kitchen oxycodone-acetaminophen (ROXICET) 5-500 MG per tablet Take 1 tablet by mouth every 4 (four) hours as needed. For pain      . pyridOXINE (VITAMIN B-6) 50 MG tablet Take 50 mg by mouth daily.       Scheduled:    . amLODipine  10 mg Oral QHS  . aspirin  81 mg Oral Daily  . calcium carbonate  1,250 mg Oral Daily  . carbidopa-levodopa  1 tablet Oral QHS  . carbidopa-levodopa  1 tablet Oral TID  . cholecalciferol  1,000 Units Oral Daily  . enoxaparin  40 mg Subcutaneous Q24H  . FLUoxetine  40 mg Oral QHS  . gabapentin  800 mg Oral QID  . ibuprofen  600 mg Oral Once  . mirtazapine  15 mg Oral QHS  . ondansetron (ZOFRAN) IV  4 mg Intravenous Once  . oxyCODONE-acetaminophen  2 tablet Oral Once  . pyridOXINE  50 mg Oral Daily  . rivaroxaban  10 mg Oral Q24H  . simvastatin  20 mg Oral Daily  . vancomycin  1,000 mg Intravenous Once  . vancomycin  1,000 mg Intravenous Q12H   Assessment: 71yo female c/o knee pain 3wk s/p knee surgery, knee erythemic around incision site with red streaks up to thigh, to begin vancomycin for cellulitis.  Goal of Therapy:  Vancomycin trough level 10-15 mcg/ml  Plan:  Rec'd vanc 1g in ED.  Will continue with vancomycin 1000mg  IV Q12H and monitor CBC, Cx, levels prn.  Colleen Can PharmD BCPS 10/12/2011,12:58 AM

## 2011-10-12 NOTE — Progress Notes (Addendum)
Patient was admitted this morning for TIA W/U and right knee pain/effusions/p recent surgery  Subjective: Patient seen and examined,C/O Right knee pain  Objective: Vital signs in last 24 hours: Temp:  [98.5 F (36.9 C)-99.1 F (37.3 C)] 98.6 F (37 C) (05/10 0524) Pulse Rate:  [88-128] 115  (05/10 0524) Resp:  [18-20] 20  (05/10 0524) BP: (111-168)/(52-94) 122/52 mmHg (05/10 0524) SpO2:  [93 %-97 %] 93 % (05/10 0524) Weight:  [86.183 kg (190 lb)-102.3 kg (225 lb 8.5 oz)] 102.3 kg (225 lb 8.5 oz) (05/10 0021) Weight change:  Last BM Date: 10/10/11  Intake/Output from previous day:       Physical Exam: General: Alert, awake, oriented x3, in no acute distress. Heart: Regular rate and rhythm, without murmurs, rubs, gallops. Lungs: Clear to auscultation bilaterally. Abdomen: Soft, nontender, nondistended, positive bowel sounds. Extremities: Right knee,swollen ,mildly tender ,mild erythema and warmth Neuro: Grossly intact, nonfocal.limited RLE exam    Lab Results: Results for orders placed during the hospital encounter of 10/11/11 (from the past 24 hour(s))  CBC     Status: Abnormal   Collection Time   10/11/11  4:05 PM      Component Value Range   WBC 6.8  4.0 - 10.5 (K/uL)   RBC 3.35 (*) 3.87 - 5.11 (MIL/uL)   Hemoglobin 11.6 (*) 12.0 - 15.0 (g/dL)   HCT 16.1 (*) 09.6 - 46.0 (%)   MCV 103.6 (*) 78.0 - 100.0 (fL)   MCH 34.6 (*) 26.0 - 34.0 (pg)   MCHC 33.4  30.0 - 36.0 (g/dL)   RDW 04.5 (*) 40.9 - 15.5 (%)   Platelets 306  150 - 400 (K/uL)  DIFFERENTIAL     Status: Abnormal   Collection Time   10/11/11  4:05 PM      Component Value Range   Neutrophils Relative 62  43 - 77 (%)   Neutro Abs 4.2  1.7 - 7.7 (K/uL)   Lymphocytes Relative 24  12 - 46 (%)   Lymphs Abs 1.7  0.7 - 4.0 (K/uL)   Monocytes Relative 13 (*) 3 - 12 (%)   Monocytes Absolute 0.9  0.1 - 1.0 (K/uL)   Eosinophils Relative 1  0 - 5 (%)   Eosinophils Absolute 0.1  0.0 - 0.7 (K/uL)   Basophils Relative 0   0 - 1 (%)   Basophils Absolute 0.0  0.0 - 0.1 (K/uL)  COMPREHENSIVE METABOLIC PANEL     Status: Abnormal   Collection Time   10/11/11  4:05 PM      Component Value Range   Sodium 138  135 - 145 (mEq/L)   Potassium 4.3  3.5 - 5.1 (mEq/L)   Chloride 99  96 - 112 (mEq/L)   CO2 28  19 - 32 (mEq/L)   Glucose, Bld 123 (*) 70 - 99 (mg/dL)   BUN 12  6 - 23 (mg/dL)   Creatinine, Ser 8.11  0.50 - 1.10 (mg/dL)   Calcium 9.8  8.4 - 91.4 (mg/dL)   Total Protein 7.4  6.0 - 8.3 (g/dL)   Albumin 3.8  3.5 - 5.2 (g/dL)   AST 14  0 - 37 (U/L)   ALT <5  0 - 35 (U/L)   Alkaline Phosphatase 114  39 - 117 (U/L)   Total Bilirubin 0.4  0.3 - 1.2 (mg/dL)   GFR calc non Af Amer >90  >90 (mL/min)   GFR calc Af Amer >90  >90 (mL/min)  TROPONIN I  Status: Normal   Collection Time   10/11/11  4:05 PM      Component Value Range   Troponin I <0.30  <0.30 (ng/mL)  URINALYSIS, ROUTINE W REFLEX MICROSCOPIC     Status: Abnormal   Collection Time   10/11/11  4:20 PM      Component Value Range   Color, Urine YELLOW  YELLOW    APPearance CLEAR  CLEAR    Specific Gravity, Urine 1.023  1.005 - 1.030    pH 6.5  5.0 - 8.0    Glucose, UA NEGATIVE  NEGATIVE (mg/dL)   Hgb urine dipstick NEGATIVE  NEGATIVE    Bilirubin Urine NEGATIVE  NEGATIVE    Ketones, ur 15 (*) NEGATIVE (mg/dL)   Protein, ur NEGATIVE  NEGATIVE (mg/dL)   Urobilinogen, UA 0.2  0.0 - 1.0 (mg/dL)   Nitrite NEGATIVE  NEGATIVE    Leukocytes, UA TRACE (*) NEGATIVE   URINE MICROSCOPIC-ADD ON     Status: Normal   Collection Time   10/11/11  4:20 PM      Component Value Range   Squamous Epithelial / LPF RARE  RARE    WBC, UA 0-2  <3 (WBC/hpf)   Bacteria, UA RARE  RARE   URINE RAPID DRUG SCREEN (HOSP PERFORMED)     Status: Normal (Preliminary result)   Collection Time   10/12/11  2:26 AM      Component Value Range   Opiates PENDING  NONE DETECTED    Cocaine NONE DETECTED  NONE DETECTED    Benzodiazepines NONE DETECTED  NONE DETECTED    Amphetamines  NONE DETECTED  NONE DETECTED    Tetrahydrocannabinol NONE DETECTED  NONE DETECTED    Barbiturates NONE DETECTED  NONE DETECTED   CBC     Status: Abnormal   Collection Time   10/12/11  6:18 AM      Component Value Range   WBC 4.9  4.0 - 10.5 (K/uL)   RBC 3.02 (*) 3.87 - 5.11 (MIL/uL)   Hemoglobin 10.2 (*) 12.0 - 15.0 (g/dL)   HCT 16.1 (*) 09.6 - 46.0 (%)   MCV 107.9 (*) 78.0 - 100.0 (fL)   MCH 33.8  26.0 - 34.0 (pg)   MCHC 31.3  30.0 - 36.0 (g/dL)   RDW 04.5 (*) 40.9 - 15.5 (%)   Platelets 273  150 - 400 (K/uL)    Studies/Results: Dg Chest 2 View  10/11/2011  *RADIOLOGY REPORT*  Clinical Data: Dizziness.  Generalized weakness.  CHEST - 2 VIEW 10/11/2011:  Comparison: Two-view chest x-ray 02/06/2011 [facility], 11/05/2009 [facility].  Findings: Cardiac silhouette enlarged but stable.  Hilar and mediastinal contours otherwise unremarkable.   Lungs clear. Bronchovascular markings normal.  Pulmonary vascularity normal.  No pneumothorax.  No pleural effusions.  Stable chronic elevation of the right hemidiaphragm.  No significant interval change.  IMPRESSION: Stable cardiomegaly.  No acute cardiopulmonary disease.  Original Report Authenticated By: Arnell Sieving, M.D.   Ct Head Wo Contrast  10/11/2011  *RADIOLOGY REPORT*  Clinical Data: Dizziness.  Stuttering.  CT HEAD WITHOUT CONTRAST 10/11/2011:  Technique:  Contiguous axial images were obtained from the base of the skull through the vertex without contrast.  Comparison: Unenhanced cranial CT 11/22/2009, 11/05/2009.  MRI brain 11/06/2009.  Findings: Ventricular system normal in size and appearance for age. Mild changes of small vessel disease of the hemispheric white matter is less apparent than on the prior MR.  No mass lesion.  No midline shift.  No  acute hemorrhage or hematoma.  No extra-axial fluid collections.  No evidence of acute infarction.  No significant interval change.  Mild changes of hyperostosis frontalis interna. Visualized  paranasal sinuses, mastoid air cells, and middle ear cavities well- aerated.  Moderate bilateral carotid siphon and mild left vertebral artery atherosclerosis.  IMPRESSION:  1.  No acute intracranial abnormality. 2.  Mild chronic microvascular ischemic changes of the white matter, better visualized on the prior MR examination.  Stable examination.  Original Report Authenticated By: Arnell Sieving, M.D.   Dg Knee Complete 4 Views Right  10/11/2011  *RADIOLOGY REPORT*  Clinical Data: Right knee pain following replacement April 2013.  RIGHT KNEE - COMPLETE 4+ VIEW  Comparison: Postoperative films of the right knee 09/13/2011.  Findings: The patient is status post right total knee arthroplasty. The knee is located.  The femoral and tibial components are well seated.  A small joint effusion is present.  IMPRESSION:  1.  Small joint effusion. 2.  No acute or focal osseous abnormality.  Original Report Authenticated By: Jamesetta Orleans. MATTERN, M.D.    Medications:    . amLODipine  10 mg Oral QHS  . aspirin  81 mg Oral Daily  . calcium carbonate  1,250 mg Oral Daily  . carbidopa-levodopa  1 tablet Oral QHS  . carbidopa-levodopa  1 tablet Oral TID  . cholecalciferol  1,000 Units Oral Daily  . FLUoxetine  40 mg Oral QHS  . gabapentin  800 mg Oral QID  . ibuprofen  600 mg Oral Once  . mirtazapine  15 mg Oral QHS  . ondansetron (ZOFRAN) IV  4 mg Intravenous Once  . oxyCODONE-acetaminophen  2 tablet Oral Once  . pyridOXINE  50 mg Oral Daily  . rivaroxaban  10 mg Oral Q24H  . simvastatin  20 mg Oral Daily  . vancomycin  1,000 mg Intravenous Once  . vancomycin  1,000 mg Intravenous Q12H  . DISCONTD: enoxaparin  40 mg Subcutaneous Q24H    acetaminophen, methocarbamol, oxyCODONE-acetaminophen, DISCONTD: acetaminophen, DISCONTD: acetaminophen, DISCONTD: oxyCODONE, DISCONTD: oxyCODONE-acetaminophen     . sodium chloride 150 mL/hr at 10/12/11 0159    Assessment/Plan:  Principal Problem:  *TIA  (transient ischemic attack) W/u in progress ,follow echo,carotid doppler .continue with ASA ,increase dose to 325 mg,continue statins. Patient had cervical spine surgery with screw placed by Dr Danielle Dess .spoke to MRI department who stated that they can do MRI ,will place the order . PT/OT /SLP consult Active Problems:  Dizziness/ Fall Check vitals for orthostatsis ,continue to hydrate  PT/OT consult  Right knee CellulitisS/P recent surgery: Cont antibiotics,can transition to po tomorrow ,ortho service was  consulted Sinus tachycardia: mostlikley secondary to pain,?anxiety  Continue to pain control and IVF .check TSH  HTN (hypertension) Controlled   GERD (gastroesophageal reflux disease) Place on PPI (avoid NSAIDS),was on ibuprofen as outpatient HX of peripheral neuropathy: On Neuronin and claimed that she is on sinemet for that as well(denied hx of parkinsonism) DVT prophylaxis: on Xarelto   LOS: 1 day   Shley Dolby 10/12/2011, 7:24 AM

## 2011-10-12 NOTE — Discharge Instructions (Signed)
STROKE/TIA DISCHARGE INSTRUCTIONS SMOKING Cigarette smoking nearly doubles your risk of having a stroke & is the single most alterable risk factor  If you smoke or have smoked in the last 12 months, you are advised to quit smoking for your health.  Most of the excess cardiovascular risk related to smoking disappears within a year of stopping.  Ask you doctor about anti-smoking medications  [facility] Quit Line: 1-800-QUIT NOW  Free Smoking Cessation Classes [phone]  CHOLESTEROL Know your levels; limit fat & cholesterol in your diet  Lipid Panel     Component Value Date/Time   CHOL  Value: 150        ATP III CLASSIFICATION:  <200     mg/dL   Desirable  098-119  mg/dL   Borderline High  >=147    mg/dL   High        01/03/9561 0146   TRIG 179* 11/06/2009 0146   HDL 43 11/06/2009 0146   CHOLHDL 3.5 11/06/2009 0146   VLDL 36 11/06/2009 0146   LDLCALC  Value: 71        Total Cholesterol/HDL:CHD Risk Coronary Heart Disease Risk Table                     Men   Women  1/2 Average Risk   3.4   3.3  Average Risk       5.0   4.4  2 X Average Risk   9.6   7.1  3 X Average Risk  23.4   11.0        Use the calculated Patient Ratio above and the CHD Risk Table to determine the patient's CHD Risk.        ATP III CLASSIFICATION (LDL):  <100     mg/dL   Optimal  130-865  mg/dL   Near or Above                    Optimal  130-159  mg/dL   Borderline  784-696  mg/dL   High  >295     mg/dL   Very High 07/12/4130 4401      Many patients benefit from treatment even if their cholesterol is at goal.  Goal: Total Cholesterol (CHOL) less than 160  Goal:  Triglycerides (TRIG) less than 150  Goal:  HDL greater than 40  Goal:  LDL (LDLCALC) less than 100   BLOOD PRESSURE American Stroke Association blood pressure target is less that 120/80 mm/Hg  Your discharge blood pressure is:  BP: 122/52 mmHg  Monitor your blood pressure  Limit your salt and alcohol intake  Many individuals will require more than one medication for  high blood pressure  DIABETES (A1c is a blood sugar average for last 3 months) Goal HGBA1c is under 7% (HBGA1c is blood sugar average for last 3 months)  Diabetes: {STROKE DC DIABETES:22357}    No results found for this basename: HGBA1C     Your HGBA1c can be lowered with medications, healthy diet, and exercise.  Check your blood sugar as directed by your physician  Call your physician if you experience unexplained or low blood sugars.  PHYSICAL ACTIVITY/REHABILITATION Goal is 30 minutes at least 4 days per week    {STROKE DC ACTIVITY/REHAB:22359}  Activity decreases your risk of heart attack and stroke and makes your heart stronger.  It helps control your weight and blood pressure; helps you relax and can improve your mood.  Participate in a regular  exercise program.  Talk with your doctor about the best form of exercise for you (dancing, walking, swimming, cycling).  DIET/WEIGHT Goal is to maintain a healthy weight  Your discharge diet is: Carb Control *** liquids Your height is:  Height: 5\' 5"  (165.1 cm) Your current weight is: Weight: 102.3 kg (225 lb 8.5 oz) Your Body Mass Index (BMI) is:  BMI (Calculated): 37.6   Following the type of diet specifically designed for you will help prevent another stroke.  Your goal weight range is:  ***  Your goal Body Mass Index (BMI) is 19-24.  Healthy food habits can help reduce 3 risk factors for stroke:  High cholesterol, hypertension, and excess weight.  RESOURCES Stroke/Support Group:  Call [phone]  they meet the 3rd Sunday of the month on the Rehab Unit at [facility], [place] ( no meetings June, July & Aug).  STROKE EDUCATION PROVIDED/REVIEWED AND GIVEN TO PATIENT Stroke warning signs and symptoms How to activate emergency medical system (call 911). Medications prescribed at discharge. Need for follow-up after discharge. Personal risk factors for stroke. Pneumonia vaccine given:   {STROKE DC YES/NO/DATE:22363} Flu vaccine given:    {STROKE DC YES/NO/DATE:22363} My questions have been answered, the writing is legible, and I understand these instructions.  I will adhere to these goals & educational materials that have been provided to me after my discharge from the hospital.

## 2011-10-12 NOTE — Progress Notes (Signed)
  Echocardiogram 2D Echocardiogram has been performed.  [name], Real Cons 10/12/2011, 5:26 PM

## 2011-10-12 NOTE — Progress Notes (Signed)
Occupational Therapy Evaluation Patient Details Name: [name] MRN: [id number] DOB: 06-13-1940 Today's Date: 10/12/2011 Time: 7829-5621 OT Time Calculation (min): 14 min  OT Assessment / Plan / Recommendation Clinical Impression  Pt admitted with dizziness and slurred speech and is 3 weeks s/p R TKR.  Pt appears to be at baseline with ADL and functional mobility status as PTA.  All education completed. No further acute OT needs.    OT Assessment  Patient does not need any further OT services    Follow Up Recommendations  Supervision - Intermittent    Barriers to Discharge      Equipment Recommendations  None recommended by OT    Recommendations for Other Services    Frequency       Precautions / Restrictions Precautions Precautions: None Restrictions Weight Bearing Restrictions: No   Pertinent Vitals/Pain N/A    ADL  Grooming: Performed;Modified independent Where Assessed - Grooming: Standing at sink Upper Body Bathing: Simulated;Set up Where Assessed - Upper Body Bathing: Sitting, bed Lower Body Bathing: Simulated;Set up Where Assessed - Lower Body Bathing: Sitting, bed Upper Body Dressing: Performed;Set up Where Assessed - Upper Body Dressing: Sitting, bed Lower Body Dressing: Performed;Set up Where Assessed - Lower Body Dressing: Sitting, bed Toilet Transfer: Simulated;Other (comment) (min guard) Toilet Transfer Method: Proofreader: Other (comment) (bed at low chair height) Equipment Used: Rolling walker Ambulation Related to ADLs: Pt ambulated with min guard for safety ADL Comments: Pt requiring increased time with ADLs due to knee pain.    OT Diagnosis:    OT Problem List:   OT Treatment Interventions:     OT Goals    Visit Information  Last OT Received On: 10/12/11 Assistance Needed: +1 PT/OT Co-Evaluation/Treatment: Yes    Subjective Data      Prior Functioning  Home Living Lives With: Alone Available Help at  Discharge: Family;Available PRN/intermittently (sons can assist) Type of Home: House Home Access: Stairs to enter [employer] of Steps: 2 Entrance Stairs-Rails: None Home Layout: One level Bathroom Shower/Tub: Tub/shower unit;Walk-in shower Bathroom Toilet: Standard Bathroom Accessibility: Yes How Accessible: Accessible via walker Home Adaptive Equipment: Walker - rolling;Shower chair with back;Bedside commode/3-in-1 Additional Comments: pt had knee surgery 3 weeks ago, home is set up for RW accesible Prior Function Level of Independence: Independent with assistive device(s) Able to Take Stairs?: Yes Vocation: Retired Comments: Pt reports she has had no difficulty with toilet transfer from regular height toilet since knee surgery. Pt reports she sat in the tub for a bath recently and needed assist to get out.  Reports now that she learned her lesson and plans to only use walk in shower with shower chair. Communication Communication: Expressive difficulties (slurred speech) Dominant Hand: Right    Cognition  Overall Cognitive Status: Appears within functional limits for tasks assessed/performed Arousal/Alertness: Awake/alert Orientation Level: Appears intact for tasks assessed Behavior During Session: [facility] for tasks performed    Extremity/Trunk Assessment Right Upper Extremity Assessment RUE ROM/Strength/Tone: Within functional levels RUE Sensation: WFL - Light Touch;WFL - Proprioception RUE Coordination: WFL - gross/fine motor Left Upper Extremity Assessment LUE ROM/Strength/Tone: Within functional levels LUE Sensation: WFL - Light Touch;WFL - Proprioception LUE Coordination: WFL - gross/fine motor Right Lower Extremity Assessment RLE ROM/Strength/Tone: Deficits RLE ROM/Strength/Tone Deficits: AROM WFL except for -10 degrees knee extension with painful MMT RLE Sensation: WFL - Light Touch RLE Coordination: WFL - gross/fine motor Left Lower Extremity Assessment LLE  ROM/Strength/Tone: Within functional levels LLE Sensation: WFL - Light  Touch LLE Coordination: WFL - gross/fine motor   Mobility Bed Mobility Bed Mobility: Supine to Sit;Sitting - Scoot to Edge of Bed Supine to Sit: 5: Supervision Sitting - Scoot to Edge of Bed: 5: Supervision Sit to Supine: 5: Supervision Details for Bed Mobility Assistance: VC for sequencing Transfers Sit to Stand: 4: Min guard;With upper extremity assist;From bed Stand to Sit: 4: Min guard;With upper extremity assist;To bed Details for Transfer Assistance: VC for hand placement and safety. [place] for safety due to painful R knee   Exercise    Balance    End of Session OT - End of Session Equipment Utilized During Treatment: Gait belt Activity Tolerance: Patient limited by pain Patient left: in chair;with call bell/phone within reach;with nursing in room Nurse Communication: Mobility status;Patient requests pain meds  10/12/2011 Cipriano Mile OTR/L Pager [phone] Office [phone]   Cipriano Mile 10/12/2011, 11:44 AM

## 2011-10-12 NOTE — Progress Notes (Signed)
*  PRELIMINARY RESULTS* Vascular Ultrasound Carotid Duplex (Doppler) has been completed.  Preliminary findings: Bilaterally no significant ICA stenosis with antegrade vertebral flow.  Farrel Demark, RDMS 10/12/2011, 11:20 AM

## 2011-10-12 NOTE — Consult Note (Signed)
Reason for Consult: R/O cellulitis/septic arthritis right knee Referring Physician: Dr. Rowland Lathe is an 71 y.o. female.  HPI: Ms. 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 She has been healing uneventfully and has had typical post-op pain. She was admitted with possible TIA to medial service and was noted to have redness/erythema of her right knee. We were asked to see her in consultation to evaluate for cellulitis /septic knee. She states that her pain is significant but has not increased in recent days. She has not had any fever,chills or wound drainage. The knee has been swollen since surgery but no increase in swelling recently. She states that the knee was red and warm upon admission but that has now subsided since she has been resting and has been on antibiotics.  Past Medical History  Diagnosis Date  . Hypertension   . GERD (gastroesophageal reflux disease)     hx pud '98  . Arthritis     djd  . Restless leg syndrome   . 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 Dysrhythmia     hx tachy palpitations  . Headache     remote hx of migraines  . Neuromuscular disorder     peripheral neuropathy, FEET AND LEGS  . Depression     bh admission '09    Past Surgical History  Procedure Date  . Cervical disc surgery x2 2011  . Rt carotid enarterectomy 2011  . Rt knee arthroscopy '99  . Left knee arthroscopy 2001  . Hematoma evacuation 2011    s/p rt cea  . Joint replacement '01    total knee replacement, LEFT  . Abdominal hysterectomy 1975    bil oophorectomy  . Back surgery  MANY YRS AGO    laminectomy x3  . Cholecystectomy 1993  . 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 No pertinent family history.  Social History:  reports that she has never smoked. She  has never used smokeless tobacco. She reports that she does not drink alcohol or use illicit drugs.  Allergies:  Allergies  Allergen Reactions  . Penicillins Itching  . Sulfa Drugs Cross Reactors Nausea And Vomiting    Medications: I have reviewed the patient's current medications.  Results for orders placed during the hospital encounter of 10/11/11 (from the past 48 hour(s))  CBC     Status: Abnormal   Collection Time   10/11/11  4:05 PM      Component Value Range Comment   WBC 6.8  4.0 - 10.5 (K/uL)    RBC 3.35 (*) 3.87 - 5.11 (MIL/uL)    Hemoglobin 11.6 (*) 12.0 - 15.0 (g/dL)    HCT 16.1 (*) 09.6 - 46.0 (%)    MCV 103.6 (*) 78.0 - 100.0 (fL)    MCH 34.6 (*) 26.0 - 34.0 (pg)    MCHC 33.4  30.0 - 36.0 (g/dL)    RDW 04.5 (*) 40.9 - 15.5 (%)    Platelets 306  150 - 400 (K/uL)   DIFFERENTIAL     Status: Abnormal   Collection Time   10/11/11  4:05 PM      Component Value Range Comment   Neutrophils Relative 62  43 - 77 (%)  Neutro Abs 4.2  1.7 - 7.7 (K/uL)    Lymphocytes Relative 24  12 - 46 (%)    Lymphs Abs 1.7  0.7 - 4.0 (K/uL)    Monocytes Relative 13 (*) 3 - 12 (%)    Monocytes Absolute 0.9  0.1 - 1.0 (K/uL)    Eosinophils Relative 1  0 - 5 (%)    Eosinophils Absolute 0.1  0.0 - 0.7 (K/uL)    Basophils Relative 0  0 - 1 (%)    Basophils Absolute 0.0  0.0 - 0.1 (K/uL)   COMPREHENSIVE METABOLIC PANEL     Status: Abnormal   Collection Time   10/11/11  4:05 PM      Component Value Range Comment   Sodium 138  135 - 145 (mEq/L)    Potassium 4.3  3.5 - 5.1 (mEq/L)    Chloride 99  96 - 112 (mEq/L)    CO2 28  19 - 32 (mEq/L)    Glucose, Bld 123 (*) 70 - 99 (mg/dL)    BUN 12  6 - 23 (mg/dL)    Creatinine, Ser 4.09  0.50 - 1.10 (mg/dL)    Calcium 9.8  8.4 - 10.5 (mg/dL)    Total Protein 7.4  6.0 - 8.3 (g/dL)    Albumin 3.8  3.5 - 5.2 (g/dL)    AST 14  0 - 37 (U/L)    ALT <5  0 - 35 (U/L)    Alkaline Phosphatase 114  39 - 117 (U/L)    Total Bilirubin 0.4  0.3 - 1.2 (mg/dL)      GFR calc non Af Amer >90  >90 (mL/min)    GFR calc Af Amer >90  >90 (mL/min)   TROPONIN I     Status: Normal   Collection Time   10/11/11  4:05 PM      Component Value Range Comment   Troponin I <0.30  <0.30 (ng/mL)   URINALYSIS, ROUTINE W REFLEX MICROSCOPIC     Status: Abnormal   Collection Time   10/11/11  4:20 PM      Component Value Range Comment   Color, Urine YELLOW  YELLOW     APPearance CLEAR  CLEAR     Specific Gravity, Urine 1.023  1.005 - 1.030     pH 6.5  5.0 - 8.0     Glucose, UA NEGATIVE  NEGATIVE (mg/dL)    Hgb urine dipstick NEGATIVE  NEGATIVE     Bilirubin Urine NEGATIVE  NEGATIVE     Ketones, ur 15 (*) NEGATIVE (mg/dL)    Protein, ur NEGATIVE  NEGATIVE (mg/dL)    Urobilinogen, UA 0.2  0.0 - 1.0 (mg/dL)    Nitrite NEGATIVE  NEGATIVE     Leukocytes, UA TRACE (*) NEGATIVE    URINE CULTURE     Status: Normal (Preliminary result)   Collection Time   10/11/11  4:20 PM      Component Value Range Comment   Specimen Description URINE, CLEAN CATCH      Special Requests NONE      Culture  Setup Time [id number]      Colony Count >=100,000 COLONIES/ML      Culture ESCHERICHIA COLI      Report Status PENDING     URINE MICROSCOPIC-ADD ON     Status: Normal   Collection Time   10/11/11  4:20 PM      Component Value Range Comment   Squamous Epithelial / LPF RARE  RARE  WBC, UA 0-2  <3 (WBC/hpf)    Bacteria, UA RARE  RARE    URINE RAPID DRUG SCREEN (HOSP PERFORMED)     Status: Normal   Collection Time   10/12/11  2:26 AM      Component Value Range Comment   Opiates UNABLE TO DETERMINE  NONE DETECTED  SENT OUT FOR CONFIRMATION   Cocaine NONE DETECTED  NONE DETECTED     Benzodiazepines NONE DETECTED  NONE DETECTED     Amphetamines NONE DETECTED  NONE DETECTED     Tetrahydrocannabinol NONE DETECTED  NONE DETECTED     Barbiturates NONE DETECTED  NONE DETECTED    LIPID PANEL     Status: Abnormal   Collection Time   10/12/11  6:15 AM      Component Value Range Comment    Cholesterol 240 (*) 0 - 200 (mg/dL)    Triglycerides 161 (*) <150 (mg/dL)    HDL 42  >09 (mg/dL)    Total CHOL/HDL Ratio 5.7      VLDL 73 (*) 0 - 40 (mg/dL)    LDL Cholesterol 604 (*) 0 - 99 (mg/dL)   HEMOGLOBIN V4U     Status: Normal   Collection Time   10/12/11  6:18 AM      Component Value Range Comment   Hemoglobin A1C 5.3  <5.7 (%)    Mean Plasma Glucose 105  <117 (mg/dL)   CBC     Status: Abnormal   Collection Time   10/12/11  6:18 AM      Component Value Range Comment   WBC 4.9  4.0 - 10.5 (K/uL)    RBC 3.02 (*) 3.87 - 5.11 (MIL/uL)    Hemoglobin 10.2 (*) 12.0 - 15.0 (g/dL)    HCT 98.1 (*) 19.1 - 46.0 (%)    MCV 107.9 (*) 78.0 - 100.0 (fL)    MCH 33.8  26.0 - 34.0 (pg)    MCHC 31.3  30.0 - 36.0 (g/dL)    RDW 47.8 (*) 29.5 - 15.5 (%)    Platelets 273  150 - 400 (K/uL)     Dg Chest 2 View  10/11/2011  *RADIOLOGY REPORT*  Clinical Data: Dizziness.  Generalized weakness.  CHEST - 2 VIEW 10/11/2011:  Comparison: Two-view chest x-ray 02/06/2011 [facility], 11/05/2009 [facility].  Findings: Cardiac silhouette enlarged but stable.  Hilar and mediastinal contours otherwise unremarkable.   Lungs clear. Bronchovascular markings normal.  Pulmonary vascularity normal.  No pneumothorax.  No pleural effusions.  Stable chronic elevation of the right hemidiaphragm.  No significant interval change.  IMPRESSION: Stable cardiomegaly.  No acute cardiopulmonary disease.  Original Report Authenticated By: Arnell Sieving, M.D.   Ct Head Wo Contrast  10/11/2011  *RADIOLOGY REPORT*  Clinical Data: Dizziness.  Stuttering.  CT HEAD WITHOUT CONTRAST 10/11/2011:  Technique:  Contiguous axial images were obtained from the base of the skull through the vertex without contrast.  Comparison: Unenhanced cranial CT 11/22/2009, 11/05/2009.  MRI brain 11/06/2009.  Findings: Ventricular system normal in size and appearance for age. Mild changes of small vessel disease of the hemispheric white matter is less  apparent than on the prior MR.  No mass lesion.  No midline shift.  No acute hemorrhage or hematoma.  No extra-axial fluid collections.  No evidence of acute infarction.  No significant interval change.  Mild changes of hyperostosis frontalis interna. Visualized paranasal sinuses, mastoid air cells, and middle ear cavities well- aerated.  Moderate bilateral carotid siphon and mild  left vertebral artery atherosclerosis.  IMPRESSION:  1.  No acute intracranial abnormality. 2.  Mild chronic microvascular ischemic changes of the white matter, better visualized on the prior MR examination.  Stable examination.  Original Report Authenticated By: Arnell Sieving, M.D.   Mr Brain Wo Contrast  10/12/2011  *RADIOLOGY REPORT*  Clinical Data: Rule out stroke.  Dizziness and falls.  Speech changes.  MRI HEAD WITHOUT CONTRAST  Technique:  Multiplanar, multiecho pulse sequences of the brain and surrounding structures were obtained according to standard protocol without intravenous contrast.  Comparison: CT head without contrast 10/11/2011.  MRI of the brain 11/06/2009.  Findings: The diffusion weighted images demonstrate no evidence for acute or subacute infarction.  Periventricular and scattered subcortical T2 and FLAIR hyperintensities are stable.  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  Flow is present in the major intracranial arteries.  The globes and orbits are intact.  Mild mucosal thickening is present in the maxillary sinuses and ethmoid air cells bilaterally.  The frontal sinuses are not aerated.  The sphenoid sinuses are clear.  Minimal fluid is present in the mastoid air cells bilaterally.  No obstructing nasopharyngeal lesion is evident.  IMPRESSION:  1.  No acute intracranial abnormality or significant interval change. 2.  Stable atrophy white matter disease.  This likely reflects the sequelae of chronic microvascular ischemia. 3.  Minimal sinus disease as described.  Original Report  Authenticated By: Jamesetta Orleans. MATTERN, M.D.   Dg Knee Complete 4 Views Right  10/11/2011  *RADIOLOGY REPORT*  Clinical Data: Right knee pain following replacement April 2013.  RIGHT KNEE - COMPLETE 4+ VIEW  Comparison: Postoperative films of the right knee 09/13/2011.  Findings: The patient is status post right total knee arthroplasty. The knee is located.  The femoral and tibial components are well seated.  A small joint effusion is present.  IMPRESSION:  1.  Small joint effusion. 2.  No acute or focal osseous abnormality.  Original Report Authenticated By: Jamesetta Orleans. MATTERN, M.D.    ROS Blood pressure 159/83, pulse 109, temperature 98.6 F (37 C), temperature source Oral, resp. rate 18, height 5\' 5"  (1.651 m), weight 102.3 kg (225 lb 8.5 oz), SpO2 95.00%.  Physical Exam Physical Examination: General appearance - alert, well appearing, and in no distress Mental status - alert, oriented to person, place, and time Extremities - Right knee has swelling but no warmth or erythema. Swelling appears to be extra-articular and in soft tissues around knee. Range of motion is 10-110 degrees. There is no pain within this range of motion. There are currently no signs of infection in the knee. Skin - No cellulitis or warmth about right knee   Assessment/Plan: Right TKA- Currently no signs of cellulitis or septic arthritis. Degree of swelling is not unusual for 1 month post-op. It appears that the rest and the antibiotics have cleared up the knee. Can discontinue antibiotics from our standpoint and see if the knee flares up again. May resume physical therapy once clear from medical standpoint. She has an appointment with Dr. Simonne Come in a few weeks and will keep that appointment. Please let us know if there are any changes in the status of her knee while she is here. Thanks  Loanne Drilling 10/12/2011, 7:44 PM

## 2011-10-12 NOTE — Evaluation (Signed)
Physical Therapy Evaluation Patient Details Name: [name] MRN: [id number] DOB: 12-08-1940 Today's Date: 10/12/2011 Time: 4098-1191 PT Time Calculation (min): 14 min  PT Assessment / Plan / Recommendation Clinical Impression  Pt presents with a medical diagnosis of TIA with residual speech deficits. Pt is also s/p right TKA 3 weeks ago with decreased knee extension and strength. Pt will benefit from skilled PT in the acute care setting in order to maximize functional mobiltiy and safety prior to d/c home alone    PT Assessment  Patient needs continued PT services    Follow Up Recommendations  Home health PT;Supervision - Intermittent (continue HHPT)    Barriers to Discharge Decreased caregiver support      lEquipment Recommendations  None recommended by PT       Frequency Min 4X/week    Precautions / Restrictions Precautions Precautions: None Restrictions Weight Bearing Restrictions: No         Mobility  Bed Mobility Bed Mobility: Supine to Sit;Sitting - Scoot to Edge of Bed Supine to Sit: 5: Supervision Sitting - Scoot to Edge of Bed: 5: Supervision Sit to Supine: 5: Supervision Details for Bed Mobility Assistance: VC for sequencing Transfers Transfers: Sit to Stand;Stand to Sit Sit to Stand: 4: Min guard;With upper extremity assist;From bed Stand to Sit: 4: Min guard;With upper extremity assist;To bed Details for Transfer Assistance: VC for hand placement and safety. Minguard for safety due to painful R knee Ambulation/Gait Ambulation/Gait Assistance: 4: Min guard Ambulation Distance (Feet): 30 Feet (distance limited by pain) Assistive device: Rolling walker Ambulation/Gait Assistance Details: VC for safe distance to RW as well as safety during turns.  Gait Pattern: Step-to pattern;Decreased stance time - right;Decreased step length - left;Decreased hip/knee flexion - right;Wide base of support Gait velocity: decreased gait speed Modified Rankin (Stroke  Patients Only) Modified Rankin: Moderate disability    Exercises     PT Diagnosis: Difficulty walking;Acute pain  PT Problem List: Decreased range of motion;Decreased strength;Decreased activity tolerance;Decreased mobility;Decreased knowledge of use of DME;Pain PT Treatment Interventions: DME instruction;Gait training;Stair training;Functional mobility training;Therapeutic activities;Therapeutic exercise;Patient/family education   PT Goals Acute Rehab PT Goals PT Goal Formulation: With patient Time For Goal Achievement: 10/19/11 Potential to Achieve Goals: Good Pt will go Supine/Side to Sit: with modified independence PT Goal: Supine/Side to Sit - Progress: Goal set today Pt will go Sit to Supine/Side: with modified independence PT Goal: Sit to Supine/Side - Progress: Goal set today Pt will go Sit to Stand: with modified independence PT Goal: Sit to Stand - Progress: Goal set today Pt will go Stand to Sit: with modified independence PT Goal: Stand to Sit - Progress: Goal set today Pt will Transfer Bed to Chair/Chair to Bed: with modified independence PT Transfer Goal: Bed to Chair/Chair to Bed - Progress: Goal set today Pt will Ambulate: >150 feet;with modified independence;with least restrictive assistive device PT Goal: Ambulate - Progress: Goal set today Pt will Go Up / Down Stairs: 1-2 stairs;with supervision;with least restrictive assistive device PT Goal: Up/Down Stairs - Progress: Goal set today  Visit Information  Last PT Received On: 10/12/11 Assistance Needed: +1 PT/OT Co-Evaluation/Treatment: Yes    Subjective Data  Subjective: "My knee really hurts"   Prior Functioning  Home Living Lives With: Alone Available Help at Discharge: Family;Available PRN/intermittently (sons can assist) Type of Home: House Home Access: Stairs to enter [employer] of Steps: 2 Entrance Stairs-Rails: None Home Layout: One level Bathroom Shower/Tub: Teacher, music: Standard Bathroom  Accessibility: Yes How Accessible: Accessible via walker Home Adaptive Equipment: Walker - rolling Additional Comments: pt had knee surgery 3 weeks ago, home is set up for RW accesible Prior Function Level of Independence: Independent with assistive device(s) Able to Take Stairs?: Yes Vocation: Retired Musician: No difficulties (slurred speech) Dominant Hand: Right    Cognition  Overall Cognitive Status: Appears within functional limits for tasks assessed/performed Arousal/Alertness: Awake/alert Orientation Level: Appears intact for tasks assessed Behavior During Session: [facility] for tasks performed    Extremity/Trunk Assessment Right Lower Extremity Assessment RLE ROM/Strength/Tone: Deficits RLE ROM/Strength/Tone Deficits: AROM WFL except for -10 degrees knee extension with painful MMT RLE Sensation: WFL - Light Touch RLE Coordination: WFL - gross/fine motor Left Lower Extremity Assessment LLE ROM/Strength/Tone: Within functional levels LLE Sensation: WFL - Light Touch LLE Coordination: WFL - gross/fine motor      End of Session PT - End of Session Equipment Utilized During Treatment: Gait belt Activity Tolerance: Patient limited by pain Patient left: in bed;with call bell/phone within reach Nurse Communication: Mobility status;Patient requests pain meds   Milana Kidney 10/12/2011, 11:29 AM  10/12/2011 Milana Kidney DPT PAGER: [phone] OFFICE: [phone]

## 2011-10-13 DIAGNOSIS — F29 Unspecified psychosis not due to a substance or known physiological condition: Secondary | ICD-10-CM

## 2011-10-13 DIAGNOSIS — R42 Dizziness and giddiness: Secondary | ICD-10-CM

## 2011-10-13 DIAGNOSIS — G459 Transient cerebral ischemic attack, unspecified: Secondary | ICD-10-CM

## 2011-10-13 DIAGNOSIS — L03119 Cellulitis of unspecified part of limb: Secondary | ICD-10-CM

## 2011-10-13 DIAGNOSIS — L02419 Cutaneous abscess of limb, unspecified: Secondary | ICD-10-CM

## 2011-10-13 LAB — CBC
HCT: 33.7 % — ABNORMAL LOW (ref 36.0–46.0)
Hemoglobin: 10.2 g/dL — ABNORMAL LOW (ref 12.0–15.0)
MCHC: 30.3 g/dL (ref 30.0–36.0)

## 2011-10-13 LAB — URINE CULTURE: Colony Count: >=100000

## 2011-10-13 LAB — BASIC METABOLIC PANEL
BUN: 11 mg/dL (ref 6–23)
CO2: 24 mEq/L (ref 19–32)
GFR calc non Af Amer: >90 mL/min (ref >90)
Glucose, Bld: 145 mg/dL — ABNORMAL HIGH (ref 70–99)
Potassium: 3.9 mEq/L (ref 3.5–5.1)

## 2011-10-13 MED ORDER — LEVOFLOXACIN 500 MG PO TABS
500.0000 mg | ORAL_TABLET | Freq: Every day | ORAL | Status: DC
  Administered 2011-10-14: 500 mg via ORAL
  Filled 2011-10-13: qty 1

## 2011-10-13 MED ORDER — SODIUM CHLORIDE 0.9 % IJ SOLN
10.0000 mL | Freq: Two times a day (BID) | INTRAMUSCULAR | Status: DC
  Administered 2011-10-14: 10 mL via INTRAVENOUS

## 2011-10-13 MED ORDER — LEVOFLOXACIN IN D5W 500 MG/100ML IV SOLN
500.0000 mg | INTRAVENOUS | Status: AC
  Administered 2011-10-13: 500 mg via INTRAVENOUS
  Filled 2011-10-13: qty 100

## 2011-10-13 NOTE — Progress Notes (Addendum)
PT Cancellation Note  Treatment cancelled today due to patient's refusal to participate.  Pt c/o pain initially, RN gave additional meds (muscle relaxer) then pt still with c/o pain and wanting to wait till after next pain meds.  Came back and pt refused stating she got pain relief and did not want to cause pain to return.  Continued to refuse despite encouragement.  Will cont to follow as pt allows.  [name] 10/13/2011, 2:26 PM  [name], PTA Acute Rehab [phone] (office)

## 2011-10-13 NOTE — Progress Notes (Signed)
Subjective: Asymptomatic.  Objective: Vital signs in last 24 hours: Temp:  [97.8 F (36.6 C)-99.3 F (37.4 C)] 98.1 F (36.7 C) (05/11 1127) Pulse Rate:  [104-122] 104  (05/11 1127) Resp:  [18-20] 20  (05/11 1127) BP: (126-159)/(46-83) 130/83 mmHg (05/11 1127) SpO2:  [91 %-95 %] 93 % (05/11 1127) Weight change:  Last BM Date: 10/11/11  Intake/Output from previous day: 05/10 0701 - 05/11 0700 In: 3000 [P.O.:1800; I.V.:1200] Out: 2 [Urine:2]     Physical Exam: General: Comfortable, alert, communicative, fully oriented, not short of breath at rest.  HEENT:  Mild clinical pallor, no jaundice, no conjunctival injection or discharge. Hydration status is fair. NECK:  Supple, JVP not seen, no carotid bruits, no palpable lymphadenopathy, no palpable goiter. CHEST:  Clinically clear to auscultation, no wheezes, no crackles. HEART:  Sounds 1 and 2 heard, normal, regular, no murmurs. ABDOMEN:  Moderately obese, soft, non-tender, no palpable organomegaly, no palpable masses, normal bowel sounds. GENITALIA:  Not examined. LOWER EXTREMITIES:  No pitting edema, palpable peripheral pulses. MUSCULOSKELETAL SYSTEM:  Generalized osteoarthritic changes, otherwise, normal. Has well healed surgical scar over right knee, without erythema or tenderness.  CENTRAL NERVOUS SYSTEM:  No focal neurologic deficit on gross examination.  Lab Results:  [facility] 10/13/11 0652 10/12/11 0618  WBC 6.5 4.9  HGB 10.2* 10.2*  HCT 33.7* 32.6*  PLT 241 273    Basename 10/13/11 0652 10/11/11 1605  NA 140 138  K 3.9 4.3  CL 106 99  CO2 24 28  GLUCOSE 145* 123*  BUN 11 12  CREATININE 0.59 0.50  CALCIUM 8.7 9.8   Recent Results (from the past 240 hour(s))  URINE CULTURE     Status: Normal (Preliminary result)   Collection Time   10/11/11  4:20 PM      Component Value Range Status Comment   Specimen Description URINE, CLEAN CATCH   Final    Special Requests NONE   Final    Culture  Setup Time [id number]    Final    Colony Count >=100,000 COLONIES/ML   Final    Culture ESCHERICHIA COLI   Final    Report Status PENDING   Incomplete      Studies/Results: Dg Chest 2 View  10/11/2011  *RADIOLOGY REPORT*  Clinical Data: Dizziness.  Generalized weakness.  CHEST - 2 VIEW 10/11/2011:  Comparison: Two-view chest x-ray 02/06/2011 [facility], 11/05/2009 [facility].  Findings: Cardiac silhouette enlarged but stable.  Hilar and mediastinal contours otherwise unremarkable.   Lungs clear. Bronchovascular markings normal.  Pulmonary vascularity normal.  No pneumothorax.  No pleural effusions.  Stable chronic elevation of the right hemidiaphragm.  No significant interval change.  IMPRESSION: Stable cardiomegaly.  No acute cardiopulmonary disease.  Original Report Authenticated By: Arnell Sieving, M.D.   Ct Head Wo Contrast  10/11/2011  *RADIOLOGY REPORT*  Clinical Data: Dizziness.  Stuttering.  CT HEAD WITHOUT CONTRAST 10/11/2011:  Technique:  Contiguous axial images were obtained from the base of the skull through the vertex without contrast.  Comparison: Unenhanced cranial CT 11/22/2009, 11/05/2009.  MRI brain 11/06/2009.  Findings: Ventricular system normal in size and appearance for age. Mild changes of small vessel disease of the hemispheric white matter is less apparent than on the prior MR.  No mass lesion.  No midline shift.  No acute hemorrhage or hematoma.  No extra-axial fluid collections.  No evidence of acute infarction.  No significant interval change.  Mild changes of hyperostosis frontalis interna. Visualized paranasal sinuses, mastoid  air cells, and middle ear cavities well- aerated.  Moderate bilateral carotid siphon and mild left vertebral artery atherosclerosis.  IMPRESSION:  1.  No acute intracranial abnormality. 2.  Mild chronic microvascular ischemic changes of the white matter, better visualized on the prior MR examination.  Stable examination.  Original Report Authenticated By: Arnell Sieving, M.D.   Mr Brain Wo Contrast  10/12/2011  *RADIOLOGY REPORT*  Clinical Data: Rule out stroke.  Dizziness and falls.  Speech changes.  MRI HEAD WITHOUT CONTRAST  Technique:  Multiplanar, multiecho pulse sequences of the brain and surrounding structures were obtained according to standard protocol without intravenous contrast.  Comparison: CT head without contrast 10/11/2011.  MRI of the brain 11/06/2009.  Findings: The diffusion weighted images demonstrate no evidence for acute or subacute infarction.  Periventricular and scattered subcortical T2 and FLAIR hyperintensities are stable.  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  Flow is present in the major intracranial arteries.  The globes and orbits are intact.  Mild mucosal thickening is present in the maxillary sinuses and ethmoid air cells bilaterally.  The frontal sinuses are not aerated.  The sphenoid sinuses are clear.  Minimal fluid is present in the mastoid air cells bilaterally.  No obstructing nasopharyngeal lesion is evident.  IMPRESSION:  1.  No acute intracranial abnormality or significant interval change. 2.  Stable atrophy white matter disease.  This likely reflects the sequelae of chronic microvascular ischemia. 3.  Minimal sinus disease as described.  Original Report Authenticated By: Jamesetta Orleans. MATTERN, M.D.   Dg Knee Complete 4 Views Right  10/11/2011  *RADIOLOGY REPORT*  Clinical Data: Right knee pain following replacement April 2013.  RIGHT KNEE - COMPLETE 4+ VIEW  Comparison: Postoperative films of the right knee 09/13/2011.  Findings: The patient is status post right total knee arthroplasty. The knee is located.  The femoral and tibial components are well seated.  A small joint effusion is present.  IMPRESSION:  1.  Small joint effusion. 2.  No acute or focal osseous abnormality.  Original Report Authenticated By: Jamesetta Orleans. MATTERN, M.D.    Medications: Scheduled Meds:   . amLODipine  10 mg Oral  QHS  . aspirin EC  325 mg Oral Daily  . atorvastatin  20 mg Oral q1800  . calcium carbonate  1,250 mg Oral Daily  . carbidopa-levodopa  1 tablet Oral QHS  . carbidopa-levodopa  1 tablet Oral TID  . cholecalciferol  1,000 Units Oral Daily  . FLUoxetine  40 mg Oral QHS  . gabapentin  800 mg Oral QID  . levofloxacin (LEVAQUIN) IV  500 mg Intravenous Q24H  . mirtazapine  15 mg Oral QHS  . pantoprazole  40 mg Oral Q1200  . pyridOXINE  50 mg Oral Daily  . rivaroxaban  10 mg Oral Q24H  . vancomycin  1,000 mg Intravenous Q12H  . DISCONTD: gabapentin  800 mg Oral QID   Continuous Infusions:   . sodium chloride 100 mL/hr at 10/13/11 0059   PRN Meds:.acetaminophen, methocarbamol, oxyCODONE-acetaminophen  Assessment/Plan:  Principal Problem:  *TIA (transient ischemic attack):   Patient presented with right-sided neurologic symptoms,as well as dysarthria. These have since resolved. Carotid doppler showed no evidence of hemodynamically significant stenosis, 2D echocardiogram was done on 10/12/11, and report is still pending. Patient is in SR. [name] now on ASA 325 mg, as well as statin treatment. Per PT/OT, no acute needs identified. SLP evaluation revealed no abnormalities. Active Problems:  1.  Dizziness/ Fall: This may have been due to TIA. No postural hypotension was evident on orthostatic blood pressure testing. Hydration status is satisfactory. IV fluids have been discontinued.  2. Right knee CellulitisS/P recent surgery:  Patient was noted to have mild right knee erythema at the time of initial evaluation. She was empirically commenced on iv Vancomycin, now day# 2. Blood cultures have been negative. Dr Ollen Gross  Provided orthopedic consultation, and has opined that clincally, patient has no evidence of cellulitis. He has recommended discontinue Vancomycin, which we have done today. 3. Sinus tachycardia:  Likley secondary to pain/anxiety. Pain control appears adequate today. 4. HTN  (hypertension):   Controlled on Norvasc. 5. GERD (gastroesophageal reflux disease)  Asymptomatic on PPI (avoid NSAIDS),was on ibuprofen as outpatient. 6. HX of peripheral neuropathy:  On Neurontin and Sinemet. Denied hx of parkinsonism. 7. UTI: Urine cultures grew E. Coli. Patient has been commenced on 7-day course of Levaquin.  Comment: Will likely discharge on 10/14/11.   LOS: 2 days   [name],CHRISTOPHER 10/13/2011, 12:52 PM

## 2011-10-14 DIAGNOSIS — L02419 Cutaneous abscess of limb, unspecified: Secondary | ICD-10-CM

## 2011-10-14 DIAGNOSIS — R42 Dizziness and giddiness: Secondary | ICD-10-CM

## 2011-10-14 DIAGNOSIS — G459 Transient cerebral ischemic attack, unspecified: Secondary | ICD-10-CM

## 2011-10-14 DIAGNOSIS — L03119 Cellulitis of unspecified part of limb: Secondary | ICD-10-CM

## 2011-10-14 DIAGNOSIS — N390 Urinary tract infection, site not specified: Secondary | ICD-10-CM | POA: Diagnosis not present

## 2011-10-14 DIAGNOSIS — F29 Unspecified psychosis not due to a substance or known physiological condition: Secondary | ICD-10-CM

## 2011-10-14 LAB — BASIC METABOLIC PANEL
Calcium: 9 mg/dL (ref 8.4–10.5)
Creatinine, Ser: 0.53 mg/dL (ref 0.50–1.10)
GFR calc Af Amer: >90 mL/min (ref >90)
GFR calc non Af Amer: >90 mL/min (ref >90)

## 2011-10-14 LAB — CBC
MCH: 33.8 pg (ref 26.0–34.0)
MCV: 108.4 fL — ABNORMAL HIGH (ref 78.0–100.0)
Platelets: 274 10*3/uL (ref 150–400)
RDW: 16.2 % — ABNORMAL HIGH (ref 11.5–15.5)

## 2011-10-14 MED ORDER — LEVOFLOXACIN 500 MG PO TABS: 500.0000 mg | ORAL_TABLET | Freq: Every day | ORAL | Status: AC

## 2011-10-14 MED ORDER — ASPIRIN 325 MG PO TBEC: 325.0000 mg | DELAYED_RELEASE_TABLET | Freq: Every day | ORAL | Status: AC

## 2011-10-14 MED ORDER — TRAMADOL HCL 50 MG PO TABS: 50.0000 mg | ORAL_TABLET | Freq: Three times a day (TID) | ORAL | Status: AC | PRN

## 2011-10-14 NOTE — Discharge Summary (Signed)
Physician Discharge Summary  Patient ID: [name] MRN: [id number] DOB/AGE: 01/21/1941 71 y.o.  Admit date: 10/11/2011 Discharge date: 10/14/2011  Primary Care Physician:  No primary provider on file.   Discharge Diagnoses:    Patient Active Problem List  Diagnoses  . Dizziness  . Fall  . TIA (transient ischemic attack)  . Cellulitis  . HTN (hypertension)  . GERD (gastroesophageal reflux disease)  . UTI (lower urinary tract infection)    Medication List  As of 10/14/2011 12:02 PM   STOP taking these medications         aspirin 81 MG chewable tablet      gabapentin 400 MG capsule      oxycodone-acetaminophen 5-500 MG per tablet      rivaroxaban 10 MG Tabs tablet         TAKE these medications         acetaminophen 500 MG tablet   Commonly known as: TYLENOL   Take 500 mg by mouth every 6 (six) hours as needed. Pain      amLODipine 10 MG tablet   Commonly known as: NORVASC   Take 10 mg by mouth at bedtime.      aspirin 325 MG EC tablet   Take 1 tablet (325 mg total) by mouth daily.      atorvastatin 20 MG tablet   Commonly known as: LIPITOR   Take 20 mg by mouth at bedtime.      calcium carbonate 1250 MG capsule   Take 1,250 mg by mouth daily.      carbidopa-levodopa 50-200 MG per tablet   Commonly known as: SINEMET CR   Take 1 tablet by mouth at bedtime.      carbidopa-levodopa 25-250 MG per tablet   Commonly known as: SINEMET IR   Take 1 tablet by mouth 3 (three) times daily.      cholecalciferol 1000 UNITS tablet   Commonly known as: VITAMIN D   Take 1,000 Units by mouth daily.      FLUoxetine 40 MG capsule   Commonly known as: PROZAC   Take 40 mg by mouth at bedtime.      gabapentin 800 MG tablet   Commonly known as: NEURONTIN   Take 800 mg by mouth 4 (four) times daily.      HYDROcodone-acetaminophen 5-500 MG per tablet   Commonly known as: VICODIN   Take 1 tablet by mouth every 6 (six) hours as needed. For pain      levofloxacin 500  MG tablet   Commonly known as: LEVAQUIN   Take 1 tablet (500 mg total) by mouth daily.      methocarbamol 500 MG tablet   Commonly known as: ROBAXIN   Take 500 mg by mouth every 6 (six) hours as needed. Muscle spasm        mirtazapine 15 MG tablet   Commonly known as: REMERON   Take 15 mg by mouth at bedtime.      pyridOXINE 50 MG tablet   Commonly known as: VITAMIN B-6   Take 50 mg by mouth daily.      traMADol 50 MG tablet   Commonly known as: ULTRAM   Take 1 tablet (50 mg total) by mouth every 8 (eight) hours as needed for pain.             Disposition and Follow-up:  Follow up with primary MD, and with Dr Simonne Come..  Consults:  orthopedic surgery  Dr Ollen Gross, orthopedic  Careers adviser.  Significant Diagnostic Studies:  Dg Chest 2 View  10/11/2011  *RADIOLOGY REPORT*  Clinical Data: Dizziness.  Generalized weakness.  CHEST - 2 VIEW 10/11/2011:  Comparison: Two-view chest x-ray 02/06/2011 [facility], 11/05/2009 [facility].  Findings: Cardiac silhouette enlarged but stable.  Hilar and mediastinal contours otherwise unremarkable.   Lungs clear. Bronchovascular markings normal.  Pulmonary vascularity normal.  No pneumothorax.  No pleural effusions.  Stable chronic elevation of the right hemidiaphragm.  No significant interval change.  IMPRESSION: Stable cardiomegaly.  No acute cardiopulmonary disease.  Original Report Authenticated By: Arnell Sieving, M.D.   Ct Head Wo Contrast  10/11/2011  *RADIOLOGY REPORT*  Clinical Data: Dizziness.  Stuttering.  CT HEAD WITHOUT CONTRAST 10/11/2011:  Technique:  Contiguous axial images were obtained from the base of the skull through the vertex without contrast.  Comparison: Unenhanced cranial CT 11/22/2009, 11/05/2009.  MRI brain 11/06/2009.  Findings: Ventricular system normal in size and appearance for age. Mild changes of small vessel disease of the hemispheric white matter is less apparent than on the prior MR.  No mass lesion.   No midline shift.  No acute hemorrhage or hematoma.  No extra-axial fluid collections.  No evidence of acute infarction.  No significant interval change.  Mild changes of hyperostosis frontalis interna. Visualized paranasal sinuses, mastoid air cells, and middle ear cavities well- aerated.  Moderate bilateral carotid siphon and mild left vertebral artery atherosclerosis.  IMPRESSION:  1.  No acute intracranial abnormality. 2.  Mild chronic microvascular ischemic changes of the white matter, better visualized on the prior MR examination.  Stable examination.  Original Report Authenticated By: Arnell Sieving, M.D.   Dg Knee Complete 4 Views Right  10/11/2011  *RADIOLOGY REPORT*  Clinical Data: Right knee pain following replacement April 2013.  RIGHT KNEE - COMPLETE 4+ VIEW  Comparison: Postoperative films of the right knee 09/13/2011.  Findings: The patient is status post right total knee arthroplasty. The knee is located.  The femoral and tibial components are well seated.  A small joint effusion is present.  IMPRESSION:  1.  Small joint effusion. 2.  No acute or focal osseous abnormality.  Original Report Authenticated By: Jamesetta Orleans. MATTERN, M.D.    Brief H and P: For complete details, refer to admission H and P. However, in brief, this is a 71 year old female status post right knee surgery 3 weeks ago, presenting with dizziness and falling down. Patient had been having speech changes and also shaking of her right upper extremity. In addition, she had worsening redness, pain and mild swelling of her right knee, over the surgical site. Patient was seen at [facility], by which time her speech has returned to normal, then transferred to [facility], for admission for further evaluation, investigation and management.   Physical Exam: On 10/14/11. General: Comfortable, alert, communicative, fully oriented, not short of breath at rest.  HEENT: Mild clinical pallor, no jaundice, no  conjunctival injection or discharge. Hydration status is fair.  NECK: Supple, JVP not seen, no carotid bruits, no palpable lymphadenopathy, no palpable goiter.  CHEST: Clinically clear to auscultation, no wheezes, no crackles.  HEART: Sounds 1 and 2 heard, normal, regular, no murmurs.  ABDOMEN: Moderately obese, soft, non-tender, no palpable organomegaly, no palpable masses, normal bowel sounds.  GENITALIA: Not examined.  LOWER EXTREMITIES: No pitting edema, palpable peripheral pulses.  MUSCULOSKELETAL SYSTEM: Generalized osteoarthritic changes, otherwise, normal. Has well healed surgical scar over right knee, without erythema  or tenderness.  CENTRAL NERVOUS SYSTEM: No focal neurologic deficit on gross examination.   Hospital Course:  Principal Problem:  *TIA (transient ischemic attack):  Patient presented with right-sided neurologic symptoms,as well as dysarthria. These have since resolved. Carotid doppler showed no evidence of hemodynamically significant stenosis, 2D echocardiogram was done on 10/12/11, and showed mild LVH, ejection fraction of 60% to 65%. Patient is in SR. Aspirin has been increased to 325 mg, and statin treatment was continued. Per PT/OT, no acute needs have been identified. Speech pathology evaluation revealed no abnormalities.  Active Problems:  1. Dizziness/ Fall: This may have been due to TIA. No postural hypotension was evident on orthostatic blood pressure testing. Hydration status is satisfactory. IV fluids were discontinued on 10/13/11.  2.Query Right knee Cellulitis, S/P recent surgery:  Patient was noted to have mild right knee erythema at the time of initial evaluation. She was empirically commenced on iv Vancomycin. Blood cultures have been negative. Dr Ollen Gross provided orthopedic consultation, and has opined that clincally, patient has no evidence of cellulitis. He recommended discontinuation of Vancomycin, which was done on 10/13/11, without deleterious effect.   3. Sinus tachycardia:  Likley secondary to pain/anxiety. Pain control appeared adequate, during this hospitalization.  4. HTN (hypertension):  Controlled on Norvasc.  5. GERD (gastroesophageal reflux disease)  Asymptomatic on PPI . Patient has been recommended to avoid NSAIDS. She was on ibuprofen, pre-admission.  6. HX of peripheral neuropathy:  Not problematic on Neurontin and Sinemet. Denied history of Parkinsonism.  7. UTI: Urine cultures grew E. Coli. Patient has been commenced on 7-day course of Levaquin, to be concluded on 10/19/11.   Comment: Stable discharge on 10/14/11.   Time spent on Discharge: 40 mins.  Signed: Carmellia Kreisler,CHRISTOPHER 10/14/2011, 12:02 PM

## 2011-10-15 NOTE — Care Management Note (Signed)
    Page 1 of 1   10/15/2011     8:47:57 AM   CARE MANAGEMENT NOTE 10/15/2011  Patient:  [name], [name]   Account Number:  [id number]  Date Initiated:  10/12/2011  Documentation initiated by:  [facility]  Subjective/Objective Assessment:   Admitted with TIA, hx of rt knee surgery 3 wks ago. Lives alone, uses walker, has HHPT with [facility].     Action/Plan:   PT eval-  OT eval   Anticipated DC Date:  10/13/2011   Anticipated DC Plan:  HOME W HOME HEALTH SERVICES      DC Planning Services  CM consult      Choice offered to / List presented to:             Status of service:  Completed, signed off Medicare Important Message given?   (If response is "NO", the following Medicare IM given date fields will be blank) Date Medicare IM given:   Date Additional Medicare IM given:    Discharge Disposition:    Per UR Regulation:  Reviewed for med. necessity/level of care/duration of stay  If discussed at Long Length of Stay Meetings, dates discussed:    Comments:  10/15/11 D/C summary faxed to [facility]. No HHPT ordered at d/c. Jacquelynn Cree RN, BSN, CCM  10/12/11 Spoke with patient about d/c plans, she lives alone and has HHPT with [facility]. She uses a rolling walker and plans to return home and resume HHPT with [facility]. Contacted [facility], spoke with Dianna, patient is active with them for HHPT. She asked that the d/c summary be faxed to [phone] upon d/c. Will contine to follow. Jacquelynn Cree RN, BSN, CCM

## 2012-06-17 ENCOUNTER — Other Ambulatory Visit: Payer: Self-pay | Admitting: Geriatric Medicine

## 2012-06-17 DIAGNOSIS — Z1231 Encounter for screening mammogram for malignant neoplasm of breast: Secondary | ICD-10-CM

## 2012-06-23 ENCOUNTER — Other Ambulatory Visit: Payer: Self-pay | Admitting: Orthopedic Surgery

## 2012-06-26 ENCOUNTER — Encounter (HOSPITAL_COMMUNITY): Payer: Self-pay | Admitting: *Deleted

## 2012-06-26 NOTE — Progress Notes (Signed)
Need orders placed in EPIC for upcoming surgery on 07/09/12.  Thanks.

## 2012-06-27 ENCOUNTER — Encounter (HOSPITAL_COMMUNITY): Payer: Self-pay | Admitting: *Deleted

## 2012-06-30 NOTE — Progress Notes (Signed)
Dr Simonne Come-  Please place pre op orders in EPIC-  Pt has surgery 07/09/12-  There are oders under other orders but cant access them under active orders   Thanks

## 2012-07-08 ENCOUNTER — Other Ambulatory Visit: Payer: Self-pay | Admitting: Orthopedic Surgery

## 2012-07-09 ENCOUNTER — Encounter (HOSPITAL_COMMUNITY): Payer: Self-pay | Admitting: Anesthesiology

## 2012-07-09 ENCOUNTER — Encounter (HOSPITAL_COMMUNITY): Payer: Self-pay | Admitting: *Deleted

## 2012-07-09 ENCOUNTER — Encounter (HOSPITAL_COMMUNITY)
Admission: RE | Disposition: A | Discharge status: Home / Self Care | Payer: Self-pay | Source: Ambulatory Visit | Attending: Orthopedic Surgery

## 2012-07-09 ENCOUNTER — Ambulatory Visit (HOSPITAL_COMMUNITY)
Admission: RE | Admit: 2012-07-09 | Discharge: 2012-07-09 | Disposition: A | Discharge status: Home / Self Care | Payer: Medicare Other | Source: Ambulatory Visit | Attending: Orthopedic Surgery | Admitting: Orthopedic Surgery

## 2012-07-09 ENCOUNTER — Ambulatory Visit (HOSPITAL_COMMUNITY): Payer: Medicare Other | Admitting: Anesthesiology

## 2012-07-09 DIAGNOSIS — Z8673 Personal history of transient ischemic attack (TIA), and cerebral infarction without residual deficits: Secondary | ICD-10-CM | POA: Insufficient documentation

## 2012-07-09 DIAGNOSIS — G5600 Carpal tunnel syndrome, unspecified upper limb: Secondary | ICD-10-CM | POA: Insufficient documentation

## 2012-07-09 DIAGNOSIS — Z79899 Other long term (current) drug therapy: Secondary | ICD-10-CM | POA: Insufficient documentation

## 2012-07-09 DIAGNOSIS — I10 Essential (primary) hypertension: Secondary | ICD-10-CM | POA: Insufficient documentation

## 2012-07-09 DIAGNOSIS — M19049 Primary osteoarthritis, unspecified hand: Secondary | ICD-10-CM | POA: Insufficient documentation

## 2012-07-09 DIAGNOSIS — K219 Gastro-esophageal reflux disease without esophagitis: Secondary | ICD-10-CM | POA: Insufficient documentation

## 2012-07-09 DIAGNOSIS — D1779 Benign lipomatous neoplasm of other sites: Secondary | ICD-10-CM | POA: Insufficient documentation

## 2012-07-09 HISTORY — DX: Cellulitis, unspecified: L03.90

## 2012-07-09 HISTORY — PX: CARPAL TUNNEL RELEASE: SHX101

## 2012-07-09 HISTORY — PX: FINGER ARTHROPLASTY: SHX5017

## 2012-07-09 HISTORY — PX: LIPOMA EXCISION: SHX5283

## 2012-07-09 LAB — SURGICAL PCR SCREEN
MRSA, PCR: NEGATIVE
Staphylococcus aureus: NEGATIVE

## 2012-07-09 LAB — BASIC METABOLIC PANEL
CO2: 23 mEq/L (ref 19–32)
Chloride: 92 mEq/L — ABNORMAL LOW (ref 96–112)
GFR calc Af Amer: >90 mL/min (ref >90)
Potassium: 4.3 mEq/L (ref 3.5–5.1)
Sodium: 133 mEq/L — ABNORMAL LOW (ref 135–145)

## 2012-07-09 LAB — CBC
Platelets: ADEQUATE 10*3/uL (ref 150–400)
RBC: 4.1 MIL/uL (ref 3.87–5.11)
RDW: 14.3 % (ref 11.5–15.5)
WBC: 11.5 10*3/uL — ABNORMAL HIGH (ref 4.0–10.5)

## 2012-07-09 SURGERY — CARPAL TUNNEL RELEASE
Anesthesia: General | Site: Wrist | Laterality: Left | Wound class: Clean

## 2012-07-09 MED ORDER — SODIUM CHLORIDE 0.9 % IV SOLN
1500.0000 mg | INTRAVENOUS | Status: DC
  Filled 2012-07-09: qty 1500

## 2012-07-09 MED ORDER — KETOROLAC TROMETHAMINE 10 MG PO TABS: 10.0000 mg | ORAL_TABLET | Freq: Four times a day (QID) | ORAL | Status: DC

## 2012-07-09 MED ORDER — LACTATED RINGERS IV SOLN: INTRAVENOUS | Status: DC | PRN

## 2012-07-09 MED ORDER — POVIDONE-IODINE 7.5 % EX SOLN: Freq: Once | CUTANEOUS | Status: DC

## 2012-07-09 MED ORDER — KETOROLAC TROMETHAMINE 15 MG/ML IJ SOLN
15.0000 mg | Freq: Once | INTRAMUSCULAR | Status: AC
  Administered 2012-07-09: 15 mg via INTRAVENOUS

## 2012-07-09 MED ORDER — 0.9 % SODIUM CHLORIDE (POUR BTL) OPTIME
TOPICAL | Status: DC | PRN
  Administered 2012-07-09: 1000 mL

## 2012-07-09 MED ORDER — HYDROMORPHONE HCL PF 1 MG/ML IJ SOLN
0.2500 mg | INTRAMUSCULAR | Status: DC | PRN
  Administered 2012-07-09 (×4): 0.5 mg via INTRAVENOUS

## 2012-07-09 MED ORDER — ACETAMINOPHEN 10 MG/ML IV SOLN
INTRAVENOUS | Status: AC
  Filled 2012-07-09: qty 100

## 2012-07-09 MED ORDER — ONDANSETRON HCL 4 MG/2ML IJ SOLN
INTRAMUSCULAR | Status: DC | PRN
  Administered 2012-07-09: 4 mg via INTRAVENOUS

## 2012-07-09 MED ORDER — METHOCARBAMOL 500 MG PO TABS: 500.0000 mg | ORAL_TABLET | Freq: Three times a day (TID) | ORAL | Status: DC

## 2012-07-09 MED ORDER — PROMETHAZINE HCL 25 MG/ML IJ SOLN
6.2500 mg | INTRAMUSCULAR | Status: DC | PRN
Start: 2012-07-09 — End: 2012-07-09

## 2012-07-09 MED ORDER — METHOCARBAMOL 100 MG/ML IJ SOLN
500.0000 mg | Freq: Once | INTRAVENOUS | Status: AC
  Administered 2012-07-09: 500 mg via INTRAVENOUS
  Filled 2012-07-09: qty 5

## 2012-07-09 MED ORDER — HEMOSTATIC AGENTS (NO CHARGE) OPTIME
TOPICAL | Status: DC | PRN
  Administered 2012-07-09: 1 via TOPICAL

## 2012-07-09 MED ORDER — BUPIVACAINE HCL (PF) 0.25 % IJ SOLN
INTRAMUSCULAR | Status: DC | PRN
  Administered 2012-07-09: 7 mL

## 2012-07-09 MED ORDER — HYDROMORPHONE HCL PF 1 MG/ML IJ SOLN
INTRAMUSCULAR | Status: AC
  Filled 2012-07-09: qty 1

## 2012-07-09 MED ORDER — PROPOFOL 10 MG/ML IV BOLUS
INTRAVENOUS | Status: DC | PRN
  Administered 2012-07-09: 170 mg via INTRAVENOUS

## 2012-07-09 MED ORDER — BUPIVACAINE HCL (PF) 0.25 % IJ SOLN
INTRAMUSCULAR | Status: AC
  Filled 2012-07-09: qty 30

## 2012-07-09 MED ORDER — FENTANYL CITRATE 0.05 MG/ML IJ SOLN
INTRAMUSCULAR | Status: AC
  Filled 2012-07-09: qty 2

## 2012-07-09 MED ORDER — ACETAMINOPHEN 10 MG/ML IV SOLN
INTRAVENOUS | Status: DC | PRN
  Administered 2012-07-09: 1000 mg via INTRAVENOUS

## 2012-07-09 MED ORDER — KETOROLAC TROMETHAMINE 15 MG/ML IJ SOLN
INTRAMUSCULAR | Status: AC
  Filled 2012-07-09: qty 1

## 2012-07-09 MED ORDER — LACTATED RINGERS IV SOLN
INTRAVENOUS | Status: DC
  Administered 2012-07-09: 1000 mL via INTRAVENOUS
  Administered 2012-07-09: 17:00:00 via INTRAVENOUS

## 2012-07-09 MED ORDER — FENTANYL CITRATE 0.05 MG/ML IJ SOLN
INTRAMUSCULAR | Status: DC | PRN
  Administered 2012-07-09 (×5): 50 ug via INTRAVENOUS

## 2012-07-09 MED ORDER — KETOROLAC TROMETHAMINE 10 MG PO TABS
10.0000 mg | ORAL_TABLET | Freq: Four times a day (QID) | ORAL | Status: DC
  Filled 2012-07-09 (×7): qty 1

## 2012-07-09 MED ORDER — LIDOCAINE HCL (CARDIAC) 20 MG/ML IV SOLN
INTRAVENOUS | Status: DC | PRN
  Administered 2012-07-09: 50 mg via INTRAVENOUS

## 2012-07-09 MED ORDER — FENTANYL CITRATE 0.05 MG/ML IJ SOLN
25.0000 ug | INTRAMUSCULAR | Status: DC | PRN
  Administered 2012-07-09 (×3): 50 ug via INTRAVENOUS

## 2012-07-09 MED ORDER — VANCOMYCIN HCL 1000 MG IV SOLR
1500.0000 mg | INTRAVENOUS | Status: DC | PRN
  Administered 2012-07-09: 1500 mg via INTRAVENOUS

## 2012-07-09 MED ORDER — MUPIROCIN 2 % EX OINT
TOPICAL_OINTMENT | Freq: Two times a day (BID) | CUTANEOUS | Status: DC
  Administered 2012-07-09: 1 via NASAL
  Filled 2012-07-09: qty 22

## 2012-07-09 SURGICAL SUPPLY — 52 items
BAG SPEC THK2 15X12 ZIP CLS (MISCELLANEOUS) ×2
BAG ZIPLOCK 12X15 (MISCELLANEOUS) ×3 IMPLANT
BANDAGE COBAN STERILE 2 (GAUZE/BANDAGES/DRESSINGS) ×3 IMPLANT
BANDAGE CONFORM 3  STR LF (GAUZE/BANDAGES/DRESSINGS) ×3 IMPLANT
BANDAGE ELASTIC 3 VELCRO ST LF (GAUZE/BANDAGES/DRESSINGS) ×3 IMPLANT
BANDAGE ELASTIC 4 VELCRO ST LF (GAUZE/BANDAGES/DRESSINGS) ×1 IMPLANT
BANDAGE ELASTIC 6 VELCRO ST LF (GAUZE/BANDAGES/DRESSINGS) ×1 IMPLANT
BANDAGE GAUZE ELAST BULKY 4 IN (GAUZE/BANDAGES/DRESSINGS) ×3 IMPLANT
BLADE OSC/SAGITTAL MD 5.5X18 (BLADE) ×1 IMPLANT
BLADE SURG SZ10 CARB STEEL (BLADE) ×4 IMPLANT
CLOTH BEACON ORANGE TIMEOUT ST (SAFETY) ×3 IMPLANT
CONT SPEC 4OZ CLIKSEAL STRL BL (MISCELLANEOUS) ×1 IMPLANT
CORDS BIPOLAR (ELECTRODE) ×3 IMPLANT
CUFF TOURN SGL QUICK 18 (TOURNIQUET CUFF) ×2 IMPLANT
CUFF TOURN SGL QUICK 24 (TOURNIQUET CUFF) ×3
CUFF TRNQT CYL 24X4X40X1 (TOURNIQUET CUFF) IMPLANT
DECANTER SPIKE VIAL GLASS SM (MISCELLANEOUS) ×3 IMPLANT
DRAPE OEC MINIVIEW 54X84 (DRAPES) ×1 IMPLANT
DRAPE SURG 17X11 SM STRL (DRAPES) ×3 IMPLANT
DRSG EMULSION OIL 3X3 NADH (GAUZE/BANDAGES/DRESSINGS) ×3 IMPLANT
DURAPREP 26ML APPLICATOR (WOUND CARE) ×3 IMPLANT
ELECT REM PT RETURN 9FT ADLT (ELECTROSURGICAL) ×3
ELECTRODE REM PT RTRN 9FT ADLT (ELECTROSURGICAL) ×2 IMPLANT
GAUZE SPONGE 4X4 16PLY XRAY LF (GAUZE/BANDAGES/DRESSINGS) IMPLANT
GAUZE XEROFORM 1X8 LF (GAUZE/BANDAGES/DRESSINGS) ×1 IMPLANT
GLOVE BIO SURGEON STRL SZ7.5 (GLOVE) ×3 IMPLANT
GLOVE BIO SURGEON STRL SZ8 (GLOVE) ×6 IMPLANT
GLOVE ECLIPSE 8.0 STRL XLNG CF (GLOVE) ×3 IMPLANT
GLOVE INDICATOR 8.0 STRL GRN (GLOVE) ×6 IMPLANT
GOWN STRL REIN XL XLG (GOWN DISPOSABLE) ×5 IMPLANT
K-WIRE CAPS STERILE WHITE .045 (WIRE) ×1 IMPLANT
KIT BASIN OR (CUSTOM PROCEDURE TRAY) ×3 IMPLANT
KWIRE 4.0 X .045IN (WIRE) ×2 IMPLANT
MANIFOLD NEPTUNE II (INSTRUMENTS) ×3 IMPLANT
NEEDLE HYPO 22GX1.5 SAFETY (NEEDLE) ×3 IMPLANT
NS IRRIG 1000ML POUR BTL (IV SOLUTION) ×3 IMPLANT
PACK LOWER EXTREMITY WL (CUSTOM PROCEDURE TRAY) ×3 IMPLANT
PAD CAST 3X4 CTTN HI CHSV (CAST SUPPLIES) ×2 IMPLANT
PAD CAST 4YDX4 CTTN HI CHSV (CAST SUPPLIES) IMPLANT
PADDING CAST COTTON 3X4 STRL (CAST SUPPLIES) ×3
PADDING CAST COTTON 4X4 STRL (CAST SUPPLIES) ×3
POSITIONER SURGICAL ARM (MISCELLANEOUS) ×3 IMPLANT
SPLINT PLASTER EXTRA FAST 3X15 (CAST SUPPLIES) ×1
SPLINT PLASTER GYPS XFAST 3X15 (CAST SUPPLIES) IMPLANT
SPONGE GAUZE 4X4 12PLY (GAUZE/BANDAGES/DRESSINGS) ×5 IMPLANT
SPONGE SURGIFOAM ABS GEL 100 (HEMOSTASIS) ×4 IMPLANT
SUT ETHILON 4 0 PS 2 18 (SUTURE) ×8 IMPLANT
SUT VIC AB 3-0 SH 27 (SUTURE) ×6
SUT VIC AB 3-0 SH 27X BRD (SUTURE) ×4 IMPLANT
SYR CONTROL 10ML LL (SYRINGE) ×3 IMPLANT
TOWEL OR 17X26 10 PK STRL BLUE (TOWEL DISPOSABLE) ×3 IMPLANT
WATER STERILE IRR 1500ML POUR (IV SOLUTION) ×2 IMPLANT

## 2012-07-09 NOTE — H&P (Signed)
[name] is an 72 y.o. female.   Chief Complaint: pain and numbness lt hand  HPI: she has delayed median nerve conduction studies lt hand;tender mass dorsum lt wrist;oa and deformity c-mc joint lt thumb  Past Medical History  Diagnosis Date  . Hypertension   . GERD (gastroesophageal reflux disease)     hx pud '98  . Arthritis     djd  . Restless leg syndrome   . 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 Dysrhythmia     hx tachy palpitations  . Headache     remote hx of migraines  . Neuromuscular disorder     peripheral neuropathy, FEET AND LEGS  . Depression     [facility] admission '09  . Stroke     ? 6 months ago - tests okay - [facility]   . Cellulitis     10/11/11 hospitalized for cellulitis     Past Surgical History  Procedure Date  . Cervical disc surgery x2 2011  . Rt carotid enarterectomy 2011  . Rt knee arthroscopy '99  . Left knee arthroscopy 2001  . Hematoma evacuation 2011    s/p rt cea  . Joint replacement '01    total knee replacement, LEFT  . Abdominal hysterectomy 1975    bil oophorectomy  . Back surgery  MANY YRS AGO    laminectomy x3  . Cholecystectomy 1993  . Total knee arthroplasty 09/13/2011    Procedure: TOTAL KNEE ARTHROPLASTY;  Surgeon: Drucilla Schmidt, MD;  Location: [facility] ORS;  Service: Orthopedics;  Laterality: Right;  . Right knee replacement     09/2011     History reviewed. No pertinent family history. Social History:  reports that she has never smoked. She has never used smokeless tobacco. She reports that she does not drink alcohol or use illicit drugs.  Allergies:  Allergies  Allergen Reactions  . Sulfa Drugs Cross Reactors Nausea And Vomiting    Medications Prior to Admission  Medication Sig Dispense Refill  . acetaminophen (TYLENOL) 500 MG tablet Take 500 mg by mouth every 6 (six) hours as needed. Pain      . amLODipine (NORVASC) 10 MG tablet Take 10 mg by mouth at bedtime.      Marland Kitchen aspirin 325 MG tablet Take 325 mg by  mouth daily.      Marland Kitchen atorvastatin (LIPITOR) 20 MG tablet Take 20 mg by mouth at bedtime.      . calcium carbonate 1250 MG capsule Take 1,250 mg by mouth daily.       . carbidopa-levodopa (SINEMET CR) 50-200 MG per tablet Take 1 tablet by mouth at bedtime.      . carbidopa-levodopa (SINEMET) 25-250 MG per tablet Take 1 tablet by mouth 3 (three) times daily.      . cholecalciferol (VITAMIN D) 1000 UNITS tablet Take 1,000 Units by mouth daily.      Marland Kitchen FLUoxetine (PROZAC) 40 MG capsule Take 40 mg by mouth at bedtime.       . gabapentin (NEURONTIN) 800 MG tablet Take 800 mg by mouth 4 (four) times daily.      . hydrochlorothiazide (MICROZIDE) 12.5 MG capsule Take 12.5 mg by mouth every evening.       Marland Kitchen HYDROcodone-acetaminophen (VICODIN) 5-500 MG per tablet Take 1 tablet by mouth every 6 (six) hours as needed. For pain      . mirtazapine (REMERON) 15 MG tablet Take 15 mg by mouth  at bedtime.      . pyridOXINE (VITAMIN B-6) 50 MG tablet Take 50 mg by mouth daily.        Results for orders placed during the hospital encounter of 07/09/12 (from the past 48 hour(s))  CBC     Status: Abnormal   Collection Time   07/09/12 12:50 PM      Component Value Range Comment   WBC 11.5 (*) 4.0 - 10.5 K/uL COUNT MAY BE INACCURATE DUE TO FIBRIN CLUMPS.   RBC 4.10  3.87 - 5.11 MIL/uL    Hemoglobin 13.2  12.0 - 15.0 g/dL    HCT 16.1  09.6 - 04.5 %    MCV 98.5  78.0 - 100.0 fL    MCH 32.2  26.0 - 34.0 pg    MCHC 32.7  30.0 - 36.0 g/dL    RDW 40.9  81.1 - 91.4 %    Platelets    150 - 400 K/uL SPECIMEN CHECKED FOR CLOTS   Value: PLATELET CLUMPS NOTED ON SMEAR, COUNT APPEARS ADEQUATE  BASIC METABOLIC PANEL     Status: Abnormal   Collection Time   07/09/12 12:50 PM      Component Value Range Comment   Sodium 133 (*) 135 - 145 mEq/L    Potassium 4.3  3.5 - 5.1 mEq/L    Chloride 92 (*) 96 - 112 mEq/L    CO2 23  19 - 32 mEq/L    Glucose, Bld 155 (*) 70 - 99 mg/dL    BUN 22  6 - 23 mg/dL    Creatinine, Ser 7.82  0.50  - 1.10 mg/dL    Calcium 9.5  8.4 - 95.6 mg/dL    GFR calc non Af Amer 90 (*) >90 mL/min    GFR calc Af Amer >90  >90 mL/min    No results found.  ROS  Blood pressure 179/76, pulse 111, temperature 97.9 F (36.6 C), temperature source Oral, resp. rate 18, SpO2 98.00%. Physical Exam  Constitutional: [name] oriented to person, place, and time. She appears well-developed and well-nourished.  HENT:  Head: Normocephalic and atraumatic.  Right Ear: External ear normal.  Left Ear: External ear normal.  Nose: Nose normal.  Mouth/Throat: Oropharynx is clear and moist.  Eyes: Conjunctivae normal and EOM are normal. Pupils are equal, round, and reactive to light.  Neck: Normal range of motion. Neck supple.  Cardiovascular: Normal rate, regular rhythm, normal heart sounds and intact distal pulses.   Respiratory: Effort normal and breath sounds normal.  GI: Soft. Bowel sounds are normal.  Musculoskeletal: Normal range of motion. She exhibits tenderness.       Lt hand--soft tissue tumor dorsum wrist;dcreased sensation median nerve digits;tender and deformed c-mc joint thumb  Neurological: [name] alert and oriented to person, place, and time. She has normal reflexes.  Skin: Skin is warm and dry.  Psychiatric: She has a normal mood and affect. Her behavior is normal. Judgment and thought content normal.     Assessment/Plan Left hand--carpal tunnel syndrome, lipoma dorsum wrist, c-mc da lt thumb Correction of all  Oreoluwa Aigner P 07/09/2012, 2:23 PM

## 2012-07-09 NOTE — Anesthesia Postprocedure Evaluation (Signed)
  Anesthesia Post-op Note  Patient: [name]  Procedure(s) Performed: Procedure(s) (LRB): CARPAL TUNNEL RELEASE (Left) FINGER ARTHROPLASTY (Left) EXCISION LIPOMA (Left)  Patient Location: PACU  Anesthesia Type: General  Level of Consciousness: awake and alert   Airway and Oxygen Therapy: Patient Spontanous Breathing  Post-op Pain: mild  Post-op Assessment: Post-op Vital signs reviewed, Patient's Cardiovascular Status Stable, Respiratory Function Stable, Patent Airway and No signs of Nausea or vomiting  Last Vitals:  Filed Vitals:   07/09/12 1830  BP: 116/60  Pulse: 94  Temp:   Resp: 12    Post-op Vital Signs: stable   Complications: No apparent anesthesia complications

## 2012-07-09 NOTE — Progress Notes (Signed)
PACU NOTE: Discussed unrelieved pain with Dr Simonne Come.  No new orders.

## 2012-07-09 NOTE — Brief Op Note (Signed)
07/09/2012  5:19 PM  PATIENT:  [name]  72 y.o. 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    PROCEDURE:  Procedure(s) (LRB) with comments: CARPAL TUNNEL RELEASE (Left) FINGER ARTHROPLASTY (Left) - Interposition Arthroplasty CMC Joint Thumb Left  EXCISION LIPOMA (Left) - Excision Lipoma Dorsum Left Wrist   SURGEON:  Surgeon(s) and Role:    * Drucilla Schmidt, MD - Primary  PHYSICIAN ASSISTANT:   ASSISTANTS: Mr Idolina Primer [facility]  ANESTHESIA:   local and general  EBL:  Total I/O In: 1300 [I.V.:1300] Out: -   BLOOD ADMINISTERED:none  DRAINS: none   LOCAL MEDICATIONS USED:  MARCAINE     SPECIMEN:  Excision  DISPOSITION OF SPECIMEN:  PATHOLOGY  COUNTS:  YES  TOURNIQUET:   Total Tourniquet Time Documented: Upper Arm (Left) - 107 minutes  DICTATION: .Other Dictation: Dictation Number [id number]  PLAN OF CARE: Discharge to home after PACU  PATIENT DISPOSITION:  PACU - hemodynamically stable.   Delay start of Pharmacological VTE agent (>24hrs) due to surgical blood loss or risk of bleeding: yes

## 2012-07-09 NOTE — Transfer of Care (Signed)
Immediate Anesthesia Transfer of Care Note  Patient: [name]  Procedure(s) Performed: Procedure(s) (LRB) with comments: CARPAL TUNNEL RELEASE (Left) FINGER ARTHROPLASTY (Left) - Interposition Arthroplasty CMC Joint Thumb Left  EXCISION LIPOMA (Left) - Excision Lipoma Dorsum Left Wrist   Patient Location: PACU  Anesthesia Type:General  Level of Consciousness: awake and alert   Airway & Oxygen Therapy: Patient Spontanous Breathing and Patient connected to face mask oxygen  Post-op Assessment: Report given to PACU RN and Post -op Vital signs reviewed and stable  Post vital signs: Reviewed and stable  Complications: No apparent anesthesia complications

## 2012-07-09 NOTE — Progress Notes (Signed)
PACU NOTE;  Dr Latanya Presser at bedside and awre of unrelieved pain.  Orders received.

## 2012-07-09 NOTE — Anesthesia Preprocedure Evaluation (Addendum)
Anesthesia Evaluation  Patient identified by MRN, date of birth, ID band Patient awake    Reviewed: Allergy & Precautions, H&P , NPO status , Patient's Chart, lab work & pertinent test results  Airway Mallampati: II TM Distance: >3 FB Neck ROM: Full    Dental   Chipped upper right incisor is to be repaired soon.:   Pulmonary neg pulmonary ROS,  breath sounds clear to auscultation  Pulmonary exam normal       Cardiovascular hypertension, Pt. on medications + dysrhythmias Rhythm:Regular Rate:Normal     Neuro/Psych  Headaches, PSYCHIATRIC DISORDERS Depression TIA/stroke 2 years ago with "garbled speech". Fully resolved.  Neuromuscular disease CVA, No Residual Symptoms    GI/Hepatic Neg liver ROS, GERD-  ,  Endo/Other  negative endocrine ROS  Renal/GU negative Renal ROS  negative genitourinary   Musculoskeletal negative musculoskeletal ROS (+)   Abdominal   Peds negative pediatric ROS (+)  Hematology negative hematology ROS (+)   Anesthesia Other Findings   Reproductive/Obstetrics negative OB ROS                         Anesthesia Physical Anesthesia Plan  ASA: III  Anesthesia Plan: General   Post-op Pain Management:    Induction: Intravenous  Airway Management Planned: LMA  Additional Equipment:   Intra-op Plan:   Post-operative Plan: Extubation in OR  Informed Consent: I have reviewed the patients History and Physical, chart, labs and discussed the procedure including the risks, benefits and alternatives for the proposed anesthesia with the patient or authorized representative who has indicated his/her understanding and acceptance.   Dental advisory given  Plan Discussed with: CRNA  Anesthesia Plan Comments:         Anesthesia Quick Evaluation

## 2012-07-09 NOTE — Progress Notes (Signed)
PACU:  Dr Simonne Come notified pt reports relief from pain and able to go home.  Also notified that pt reports Robaxin not covered by insurance.  Requests Zanaflex for muscle relaxer.  Dr Simonne Come will call prescription to pharmacy in AM.

## 2012-07-10 ENCOUNTER — Encounter (HOSPITAL_COMMUNITY): Payer: Self-pay | Admitting: Orthopedic Surgery

## 2012-07-10 NOTE — Op Note (Signed)
[name], [name] NO.:  [id number]  MEDICAL RECORD NO.:  [id number]  LOCATION:  [facility]                         FACILITY:  [facility]  PHYSICIAN:  Marlowe Kays, M.D.  DATE OF BIRTH:  [birth year]  DATE OF PROCEDURE:  07/09/2012 DATE OF DISCHARGE:  07/09/2012                              OPERATIVE REPORT   PREOPERATIVE DIAGNOSES: 1. Carpal tunnels syndrome. 2. Painful lipoma, dorsum wrist. 3. Painful osteoarthritis, carpometacarpal joint, thumb left hand.  POSTOPERATIVE DIAGNOSES: 1. Carpal tunnels syndrome. 2. Painful lipoma, dorsum wrist. 3. Painful osteoarthritis, carpometacarpal joint, thumb left hand.  OPERATION: 1. Carpal tunnel release. 2. Excision of lipoma, dorsum left wrist. 3. In position arthroplasty carpometacarpal joint left thumb.  SURGEON:  Marlowe Kays, M.D.  ASSISTANT:  Mr. Idolina Primer PA-C  ANESTHESIA:  General/  PATHOLOGY:  Justification for procedure she had carpal tunnel symptoms with abnormal median nerve conduction studies.  She has documented osteoarthritis at the carpometacarpal joint of her left thumb and this is proved refractory to nonsurgical treatment.  PROCEDURE:  Satisfactory general anesthesia, prophylactic antibiotics, supine position.  Pneumatic tourniquet, left arm was prepped with DuraPrep from just past the elbow to finger tips with a DuraPrep and draped in sterile field.  The arm was Esmarched out nonsterilely and tourniquet inflated to 300 mmHg.  Prior to the prep, time-out performed. I first explored the dorsum of her left wrist with a slightly curved incision, centered over the palpable mass.  This was at surgery at least appeared to be benign.  I excised it in total, and the specimen will be sent to pathology for confirmed diagnosis.  The subcutaneous tissue was closed with interrupted 3-0 Vicryl and skin with interrupted 4-0 nylon mattress sutures.  I then made a curved incision along the base of thenar  eminence crossing obliquely over the flexor crease of the wrist and in the distal forearm.  The median nerve was identified proximal to the wrist.  There were several bands at this location compressing the nerve.  Showing a rudimentary palmaris longus tendons, I did not feel it was appropriate to use for the interpositional arthroplasty to follow up.  I released the skin and subcutaneous tissue and fascia into the distal palm.  Potential bleeders were coagulated with bipolar cautery. At the conclusion of decompression, I irrigated the wound well with sterile saline and closed the skin and subcutaneous tissue only with interrupted 4-0 nylon mattress sutures.  Lastly, I made a dorsal radial incision, centered over the carpometacarpal joint.  Incision was carried down in a natural interval between the thenar muscles and the dorsal muscles.  What I thought was the [facility] joint was identified and confirmed with a needle and mini C-arm.  I then carefully exposed the base of the first metacarpal and placed baby Bennett retractors, either side and used a micro saw to amputate it at the axial flair.  Small remnants of bone were removed with a rongeur.  I then isolated the distal portion of the greater multangular and protecting the soft tissues in either side, used a micro saw to cut the bone at this level removing it in piecemeal for safety.  I then placed the 0.45  K-wires from proximal to distal through the end of the case 1st metacarpal exiting out the ulnar side. I then placed them under direct visualization into the remaining portion of the greater multangular.  I then placed Gelfoam as the interpositional component and released the tourniquet, and the thumb was nice and pink.  A block was performed with 0.25% Marcaine plain and then closed the subcutaneous tissue with interrupted 3-0 Vicryl and skin with interrupted 4-0 nylon mattress sutures.  Betadine, Adaptic dry sterile dressing and a  well-padded thumb spica splint cast was applied.  She tolerated the procedure well, was taken to recovery room in satisfied condition with no known complications.          ______________________________ Marlowe Kays, M.D.     JA/MEDQ  D:  07/09/2012  T:  07/10/2012  Job:  [id number]

## 2012-07-14 ENCOUNTER — Ambulatory Visit: Payer: Medicare Other

## 2012-07-14 NOTE — Op Note (Signed)
NAME:  [name], [name] NO.:  [id number]  MEDICAL RECORD NO.:  [id number]  LOCATION:  [facility]                         FACILITY:  [facility]  PHYSICIAN:  Marlowe Kays, M.D.  DATE OF BIRTH:  [birth year]  DATE OF PROCEDURE:  07/09/2012 DATE OF DISCHARGE:  07/09/2012                              OPERATIVE REPORT   ADDENDUM:  The size of the lipoma excised at today's surgery was 6.0 x 4.0 x 4.5 cm.          ______________________________ Marlowe Kays, M.D.     JA/MEDQ  D:  07/14/2012  T:  07/14/2012  Job:  [id number]

## 2012-08-28 ENCOUNTER — Telehealth: Payer: Self-pay

## 2012-08-28 MED ORDER — HYDROCODONE-ACETAMINOPHEN 5-325 MG PO TABS: 1.0000 | ORAL_TABLET | Freq: Four times a day (QID) | ORAL | Status: DC | PRN

## 2012-08-28 NOTE — Telephone Encounter (Signed)
Patient called requesting her Hydrocodone rx be changed from 5/500mg  to 5/325mg .  Please advise.  Thank you.

## 2012-08-28 NOTE — Telephone Encounter (Signed)
I will change the prescription to the 5/325 mg hydrocodone tablets. The patient has 90 tablets a month, must last 28 days. 3 refills were given.

## 2012-11-20 ENCOUNTER — Ambulatory Visit: Payer: Medicare Other

## 2012-12-16 ENCOUNTER — Ambulatory Visit: Payer: Self-pay | Admitting: Nurse Practitioner

## 2012-12-17 ENCOUNTER — Other Ambulatory Visit: Payer: Self-pay | Admitting: Neurology

## 2012-12-25 ENCOUNTER — Other Ambulatory Visit: Payer: Self-pay | Admitting: Neurology

## 2013-01-29 ENCOUNTER — Ambulatory Visit: Payer: Self-pay | Admitting: Nurse Practitioner

## 2013-03-10 ENCOUNTER — Encounter: Payer: Self-pay | Admitting: Internal Medicine

## 2013-03-11 ENCOUNTER — Other Ambulatory Visit: Payer: Self-pay

## 2013-03-11 MED ORDER — HYDROCODONE-ACETAMINOPHEN 5-325 MG PO TABS: 1.0000 | ORAL_TABLET | Freq: Four times a day (QID) | ORAL | Status: DC | PRN

## 2013-03-11 NOTE — Telephone Encounter (Signed)
Patient called requesting a refill on Hydrocodone.  This is now a CII narcotic, therefore no refills can be given.  Patient is aware of this and would ike to picm up the Rx when it's ready.  Call back number [phone]

## 2013-03-12 ENCOUNTER — Telehealth: Payer: Self-pay | Admitting: Neurology

## 2013-03-12 ENCOUNTER — Ambulatory Visit: Payer: Self-pay | Admitting: Internal Medicine

## 2013-03-13 NOTE — Telephone Encounter (Signed)
Duplicate message,  Please see previous note.

## 2013-03-19 ENCOUNTER — Ambulatory Visit: Payer: Self-pay | Admitting: Nurse Practitioner

## 2013-03-29 ENCOUNTER — Other Ambulatory Visit: Payer: Self-pay | Admitting: Neurology

## 2013-03-30 ENCOUNTER — Other Ambulatory Visit: Payer: Self-pay | Admitting: Neurology

## 2013-04-08 ENCOUNTER — Ambulatory Visit (INDEPENDENT_AMBULATORY_CARE_PROVIDER_SITE_OTHER): Payer: Medicare Other | Admitting: Nurse Practitioner

## 2013-04-08 ENCOUNTER — Encounter: Payer: Self-pay | Admitting: Nurse Practitioner

## 2013-04-08 ENCOUNTER — Encounter (INDEPENDENT_AMBULATORY_CARE_PROVIDER_SITE_OTHER): Payer: Self-pay

## 2013-04-08 VITALS — BP 122/66 | HR 104 | Ht 65.25 in | Wt 229.0 lb

## 2013-04-08 DIAGNOSIS — M25569 Pain in unspecified knee: Secondary | ICD-10-CM

## 2013-04-08 DIAGNOSIS — M25561 Pain in right knee: Secondary | ICD-10-CM

## 2013-04-08 DIAGNOSIS — M549 Dorsalgia, unspecified: Secondary | ICD-10-CM

## 2013-04-08 DIAGNOSIS — G63 Polyneuropathy in diseases classified elsewhere: Secondary | ICD-10-CM | POA: Insufficient documentation

## 2013-04-08 DIAGNOSIS — G2581 Restless legs syndrome: Secondary | ICD-10-CM

## 2013-04-08 MED ORDER — CARBIDOPA-LEVODOPA 25-250 MG PO TABS: 1.0000 | ORAL_TABLET | Freq: Three times a day (TID) | ORAL | Status: DC

## 2013-04-08 MED ORDER — HYDROCODONE-ACETAMINOPHEN 5-325 MG PO TABS: 1.0000 | ORAL_TABLET | Freq: Four times a day (QID) | ORAL | Status: DC | PRN

## 2013-04-08 MED ORDER — CARBIDOPA-LEVODOPA ER 50-200 MG PO TBCR: 1.0000 | EXTENDED_RELEASE_TABLET | Freq: Every day | ORAL | Status: DC

## 2013-04-08 MED ORDER — GABAPENTIN 800 MG PO TABS: 800.0000 mg | ORAL_TABLET | Freq: Four times a day (QID) | ORAL | Status: DC

## 2013-04-08 NOTE — Progress Notes (Signed)
I have read the note, and I agree with the clinical assessment and plan.  WILLIS,CHARLES KEITH   

## 2013-04-08 NOTE — Patient Instructions (Addendum)
Will refer to pain management Refill  Sinemet Gabapentin and Vicodin F/U yearly

## 2013-04-08 NOTE — Progress Notes (Signed)
[facility]  PATIENT: [name] DOB: 09-Mar-1941   REASON FOR VISIT: Followup for restless legs and polyneuropathy   HISTORY OF PRESENT ILLNESS: [name] is a 72 year old right-handed white female with a history of a peripheral neuropathy, obesity, and degenerative arthritis. 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 [name] still having pain in the knee. She also has back pain followed by Dr. Danielle Dess. [name] not using any assistive device. The patient has been taking Vicodin for pain. The patient has restless leg syndrome, and takes Sinemet for this with good benefit. The patient indicates that she is able to rest at night fairly well.She also takes neurontin for her neuropathy. Symptoms are fairly well controlled. She has been having problems with left hand,recent surgery on left thumb and CT release on the left.   REVIEW OF SYSTEMS: Full 14 system review of systems performed and notable only for:  Constitutional: N/A  Cardiovascular: N/A  Ear/Nose/Throat: N/A  Skin: N/A  Eyes: N/A  Respiratory: N/A  Gastroitestinal: N/A  Hematology/Lymphatic: N/A  Endocrine: N/A Musculoskeletal:joint pain, joint swelling, aching muscles Allergy/Immunology: N/A  Neurological: numbness Psychiatric: depression  ALLERGIES: Allergies  Allergen Reactions  . Sulfa Drugs Cross Reactors Nausea And Vomiting    HOME MEDICATIONS: Outpatient Prescriptions Prior to Visit  Medication Sig Dispense Refill  . acetaminophen (TYLENOL) 500 MG tablet Take 500 mg by mouth every 6 (six) hours as needed. Pain      . amLODipine (NORVASC) 10 MG tablet Take 10 mg by mouth at bedtime.      Marland Kitchen amoxicillin (AMOXIL) 500 MG capsule Take 500 mg by mouth. Take 4 pills 1 hour prior to dentist appointment      . aspirin 325 MG tablet Take 325 mg by mouth daily.      Marland Kitchen atorvastatin (LIPITOR) 20 MG tablet Take 20 mg by mouth at bedtime.      . calcium carbonate 1250 MG capsule Take 1,250 mg  by mouth daily.       . carbidopa-levodopa (SINEMET CR) 50-200 MG per tablet TAKE 1 TABLET BY MOUTH EVERY NIGHT AT BEDTIME  90 tablet  0  . carbidopa-levodopa (SINEMET IR) 25-250 MG per tablet TAKE 1 TABLET BY MOUTH THREE TIMES DAILY  90 tablet  0  . cholecalciferol (VITAMIN D) 1000 UNITS tablet Take 1,000 Units by mouth daily.      Marland Kitchen FLUoxetine (PROZAC) 40 MG capsule Take 40 mg by mouth at bedtime.       . gabapentin (NEURONTIN) 800 MG tablet TAKE 1 TABLET BY MOUTH FOUR TIMES DAILY  120 tablet  5  . hydrochlorothiazide (HYDRODIURIL) 25 MG tablet Take 25 mg by mouth daily.      Marland Kitchen HYDROcodone-acetaminophen (NORCO/VICODIN) 5-325 MG per tablet Take 1 tablet by mouth every 6 (six) hours as needed (MUST LAST 28 DAYS).  90 tablet  0  . mirtazapine (REMERON) 15 MG tablet Take 15 mg by mouth at bedtime.      . pyridOXINE (VITAMIN B-6) 50 MG tablet Take 50 mg by mouth daily.      . SUMAtriptan (IMITREX) 50 MG tablet Take 50 mg by mouth every 2 (two) hours as needed for migraine. May repeat in 2 hours if headache persists or recurs.      Marland Kitchen ketorolac (TORADOL) 10 MG tablet Take 1 tablet (10 mg total) by mouth every 6 (six) hours.  20 tablet  1  . methocarbamol (ROBAXIN) 500 MG tablet Take 1 tablet (500  mg total) by mouth 3 (three) times daily.  30 tablet  3   No facility-administered medications prior to visit.    PAST MEDICAL HISTORY: Past Medical History  Diagnosis Date  . Hypertension   . GERD (gastroesophageal reflux disease)     hx pud '98  . Arthritis     djd  . Restless leg syndrome   . 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 Dysrhythmia     hx tachy palpitations  . Headache(784.0)     remote hx of migraines  . Neuromuscular disorder     peripheral neuropathy, FEET AND LEGS  . Depression     bh admission '09  . Stroke     ? 6 months ago - tests okay - [facility]   . Cellulitis     10/11/11 hospitalized for cellulitis   . Hyperlipemia   . Peripheral neuropathy     PAST  SURGICAL HISTORY: Past Surgical History  Procedure Laterality Date  . Cervical disc surgery x2  2011  . Rt carotid enarterectomy  2011  . Rt knee arthroscopy  '99  . Left knee arthroscopy  2001  . Hematoma evacuation  2011    s/p rt cea  . Joint replacement  '01    total knee replacement, LEFT  . Abdominal hysterectomy  1975    bil oophorectomy  . Back surgery   MANY YRS AGO    laminectomy x3  . Cholecystectomy  1993  . Total knee arthroplasty  09/13/2011    Procedure: TOTAL KNEE ARTHROPLASTY;  Surgeon: Drucilla Schmidt, MD;  Location: [facility];  Service: Orthopedics;  Laterality: Right;  . Right knee replacement      09/2011   . Carpal tunnel release  07/09/2012    Procedure: CARPAL TUNNEL RELEASE;  Surgeon: Drucilla Schmidt, MD;  Location: [facility];  Service: Orthopedics;  Laterality: Left;  . Finger arthroplasty  07/09/2012    Procedure: FINGER ARTHROPLASTY;  Surgeon: Drucilla Schmidt, MD;  Location: [facility];  Service: Orthopedics;  Laterality: Left;  Interposition Arthroplasty CMC Joint Thumb Left   . Lipoma excision  07/09/2012    Procedure: EXCISION LIPOMA;  Surgeon: Drucilla Schmidt, MD;  Location: [facility];  Service: Orthopedics;  Laterality: Left;  Excision Lipoma Dorsum Left Wrist     FAMILY HISTORY: Family History  Problem Relation Age of Onset  . Congestive Heart Failure Father   . Congestive Heart Failure Sister   . Diabetes Brother   . Alzheimer's disease Mother   . Arthritis Brother     SOCIAL HISTORY: History   Social History  . Marital Status: Widowed    Spouse Name: N/A    Number of Children: 2  . Years of Education: 14   Occupational History  . Not on file.   Social History Main Topics  . Smoking status: Never Smoker   . Smokeless tobacco: Never Used  . Alcohol Use: No  . Drug Use: No  . Sexual Activity:    Other Topics Concern  . Not on file   Social History Narrative   Patient is widowed and lives alone.   Patient has two sons.   Patient is  retired.   Patient has a college education.   Patient is right-handed.   Patient drinks three glasses of Coke-soda daily.           PHYSICAL EXAM  Filed Vitals:   04/08/13 1016  BP: 122/66  Pulse: 104  Height: 5' 5.25" (1.657 m)  Weight: 229 lb (103.874 kg)   Body mass index is 37.83 kg/(m^2).  Generalized: Well developed, obese female in no acute distress  Head: normocephalic and atraumatic,. Oropharynx benign  Neck: Supple, no carotid bruits  Cardiac: Regular rate rhythm, no murmur  Musculoskeletal: No deformity   Neurological examination   Mentation: Alert oriented to time, place, history taking. Follows all commands speech and language fluent  Cranial nerve II-XII: Pupils were equal round reactive to light extraocular movements were full, visual field were full on confrontational test. Facial sensation and strength were normal. hearing was intact to finger rubbing bilaterally. Uvula tongue midline. head turning and shoulder shrug  were normal and symmetric.Tongue protrusion into cheek strength was normal. Motor: normal bulk and tone, full strength in the BUE, BLE, fine finger movements normal, no pronator drift. No focal weakness Sensory: normal and symmetric to light touch, pinprick, and  decreased vibration to ankles  Coordination: finger-nose-finger,  no dysmetria Reflexes: Depressed and symmetric Gait and Station: Rising up from seated position without assistance, normal stance,  moderate stride, good arm swing, smooth turning, able to perform tiptoe, and heel walking without difficulty. Tandem gait mildly unsteady   DIAGNOSTIC DATA (LABS, IMAGING, TESTING) - I reviewed patient records, labs, notes, testing and imaging myself where available.  Lab Results  Component Value Date   WBC 11.5* 07/09/2012   HGB 13.2 07/09/2012   HCT 40.4 07/09/2012   MCV 98.5 07/09/2012   PLT PLATELET CLUMPS NOTED ON SMEAR, COUNT APPEARS ADEQUATE 07/09/2012      Component Value Date/Time    NA 133* 07/09/2012 1250   K 4.3 07/09/2012 1250   CL 92* 07/09/2012 1250   CO2 23 07/09/2012 1250   GLUCOSE 155* 07/09/2012 1250   BUN 22 07/09/2012 1250   CREATININE 0.61 07/09/2012 1250   CALCIUM 9.5 07/09/2012 1250   PROT 7.4 10/11/2011 1605   ALBUMIN 3.8 10/11/2011 1605   AST 14 10/11/2011 1605   ALT <5 10/11/2011 1605   ALKPHOS 114 10/11/2011 1605   BILITOT 0.4 10/11/2011 1605   GFRNONAA 90* 07/09/2012 1250   GFRAA >90 07/09/2012 1250   ASSESSMENT AND PLAN  72 y.o. year old female  has a past medical history of Hypertension; Arthritis; Restless leg syndrome; Dysrhythmia; Headache(784.0);  Depression; chronic joint pain Hyperlipemia; and Polyneuropathy here to followup. He has had left thumb surgery and carpal tunnel surgery since last seen. She has back pain and bilateral knee pain that is treated by orthopedist  and Dr. Danielle Dess. Discussed having her pain medication managed by pain clinic and she is agreeable to that.  Will refer to pain management, Dr. Thyra Breed Refill  Sinemet Gabapentin and Vicodin F/U yearly Nilda Riggs, [facility], [facility], APRN  [facility] [address], [address] [place], [place] [address] [phone]

## 2013-04-24 ENCOUNTER — Encounter: Payer: Self-pay | Admitting: *Deleted

## 2013-04-27 ENCOUNTER — Ambulatory Visit: Payer: Self-pay | Admitting: Internal Medicine

## 2013-05-05 ENCOUNTER — Telehealth: Payer: Self-pay | Admitting: Neurology

## 2013-05-05 ENCOUNTER — Other Ambulatory Visit: Payer: Self-pay

## 2013-05-05 MED ORDER — HYDROCODONE-ACETAMINOPHEN 5-325 MG PO TABS: 1.0000 | ORAL_TABLET | Freq: Four times a day (QID) | ORAL | Status: DC | PRN

## 2013-05-05 NOTE — Telephone Encounter (Signed)
Patient called requesting a refill on pain meds.  Last Rx from 11/05 says the patient was referred to pain clinic.  I called the patient back to inquire if she had gone, or when appt was, got no answer.  Left message.

## 2013-05-05 NOTE — Telephone Encounter (Signed)
I will write a prescription for the hydrocodone. 

## 2013-05-05 NOTE — Telephone Encounter (Signed)
I called and left VM for patient that we have an Rx for her to pick up at front desk after 3:30 p.m. Today.

## 2013-05-06 ENCOUNTER — Telehealth: Payer: Self-pay | Admitting: Neurology

## 2013-05-06 NOTE — Telephone Encounter (Signed)
Patient wanted to inform Dr Anne Hahn that her pain management referral had not be sent until 11/5, and she may not have enough hydrocodone pills to last until her appt. with Dr Vear Clock

## 2013-05-06 NOTE — Telephone Encounter (Signed)
F/U PAIN MANAGEMENT REFERRAL-NEVER SCHEDULED

## 2013-05-06 NOTE — Telephone Encounter (Signed)
Prescription was written yesterday, we need to have the patient come and pick up the prescription.

## 2013-05-06 NOTE — Telephone Encounter (Signed)
F/U PAIN MANAGEMENT REFERRAL-NEVER SCHEDULED °

## 2013-05-06 NOTE — Telephone Encounter (Signed)
F/U ON PAIN MANAGEMENT REFERRAL--NEVER SCHEDULED

## 2013-05-07 NOTE — Telephone Encounter (Signed)
Called patient to inform, goes straight to vmail. Left message.

## 2013-05-12 ENCOUNTER — Telehealth: Payer: Self-pay | Admitting: Neurology

## 2013-05-12 NOTE — Telephone Encounter (Signed)
Informed patient

## 2013-05-12 NOTE — Telephone Encounter (Signed)
That is ok 

## 2013-05-12 NOTE — Telephone Encounter (Signed)
Patient called stating that [name] referred her to Dr. Thyra [name] for pain management, but the soonest they would be able to get her in was 2/10 with Dr. Jeani [name] and not with Dr. Vear [name]. Patient wants to make sure that this is okay. Please call.

## 2013-05-20 ENCOUNTER — Telehealth: Payer: Self-pay

## 2013-05-20 MED ORDER — HYDROCODONE-ACETAMINOPHEN 5-325 MG PO TABS
1.0000 | ORAL_TABLET | Freq: Four times a day (QID) | ORAL | Status: DC | PRN
Start: 2013-05-20 — End: 2013-06-19

## 2013-05-20 NOTE — Telephone Encounter (Signed)
I called patient. The patient has been using more hydrocodone than one a day. I'll write another prescription for 60 tablets. The patient also has a topical ointment for the peripheral neuropathy, and the formulary may change to help save money. I am not sure whether [name] has heard about this through the pharmacy. I'll contact her concerning this.

## 2013-05-20 NOTE — Telephone Encounter (Signed)
Patient called saying she needs another refill on Hydrocodone.  Rx for #30 was written on 12/02, but says she would like Rx for #90 rather than #30, as that will not last her 28 days.  States she has an appt at the pain clinic in Feb. Please advise.  Thank you.

## 2013-05-21 ENCOUNTER — Other Ambulatory Visit: Payer: Self-pay | Admitting: Neurology

## 2013-05-21 NOTE — Telephone Encounter (Signed)
Called patient to inform her that her prescription was ready to be picked up and if she has any other problems, questions or concerns to call the office.

## 2013-05-22 NOTE — Telephone Encounter (Signed)
I have not heard from the pharmacy yet.  I called them at [phone].  Spoke with Norwood Levo.  She said the new formulation is less expensive for the patient.  I advised her it okay to update per Dr Anne Hahn.

## 2013-05-25 ENCOUNTER — Telehealth: Payer: Self-pay | Admitting: Neurology

## 2013-05-25 NOTE — Telephone Encounter (Signed)
I called the pharmacy back.  Spoke with Dennard Nip (female, said her name was not just Carney Bern).  Explained the situation.  She said she will document the Rx when patient brings it back in that Dr Anne Hahn did write an additional Rx to be filled.

## 2013-05-25 NOTE — Telephone Encounter (Signed)
RETURNING CALL--STATES YOU NEED TO SPEAK WITH JEAN @ [facility]

## 2013-05-25 NOTE — Telephone Encounter (Signed)
Dr Anne Hahn gave the patient an additional Rx for Hydrocodone per previous note.  I called the pharmacy back.  Spoke with Abby.  They would not fill the Rx.  She said the patient took the Rx back with her, so there is nothing they can do.  She said the patient would have to bring the Rx back to them, then we would have to call them back after they had it in their possession.  I called the patient.  She said the pharmacy told her they could not keep the Rx and made her take it back.  [name] going to call the pharmacy to see what she has to do.  She will call us back if needed.

## 2013-06-11 ENCOUNTER — Ambulatory Visit (INDEPENDENT_AMBULATORY_CARE_PROVIDER_SITE_OTHER): Payer: Medicare Other | Admitting: Internal Medicine

## 2013-06-11 ENCOUNTER — Encounter: Payer: Self-pay | Admitting: Internal Medicine

## 2013-06-11 VITALS — BP 186/100 | HR 102 | Temp 99.7°F | Resp 20 | Ht 64.57 in | Wt 222.2 lb

## 2013-06-11 DIAGNOSIS — G63 Polyneuropathy in diseases classified elsewhere: Secondary | ICD-10-CM

## 2013-06-11 DIAGNOSIS — K219 Gastro-esophageal reflux disease without esophagitis: Secondary | ICD-10-CM

## 2013-06-11 DIAGNOSIS — Z0000 Encounter for general adult medical examination without abnormal findings: Secondary | ICD-10-CM

## 2013-06-11 DIAGNOSIS — M129 Arthropathy, unspecified: Secondary | ICD-10-CM

## 2013-06-11 DIAGNOSIS — E785 Hyperlipidemia, unspecified: Secondary | ICD-10-CM

## 2013-06-11 DIAGNOSIS — F32A Depression, unspecified: Secondary | ICD-10-CM | POA: Insufficient documentation

## 2013-06-11 DIAGNOSIS — M1990 Unspecified osteoarthritis, unspecified site: Secondary | ICD-10-CM

## 2013-06-11 DIAGNOSIS — G2581 Restless legs syndrome: Secondary | ICD-10-CM

## 2013-06-11 DIAGNOSIS — F3289 Other specified depressive episodes: Secondary | ICD-10-CM

## 2013-06-11 DIAGNOSIS — I10 Essential (primary) hypertension: Secondary | ICD-10-CM

## 2013-06-11 DIAGNOSIS — F329 Major depressive disorder, single episode, unspecified: Secondary | ICD-10-CM

## 2013-06-11 DIAGNOSIS — G43901 Migraine, unspecified, not intractable, with status migrainosus: Secondary | ICD-10-CM

## 2013-06-11 MED ORDER — HYDROCHLOROTHIAZIDE 25 MG PO TABS: 25.0000 mg | ORAL_TABLET | Freq: Every day | ORAL | Status: DC

## 2013-06-11 MED ORDER — SUMATRIPTAN SUCCINATE 50 MG PO TABS: 50.0000 mg | ORAL_TABLET | ORAL | Status: DC | PRN

## 2013-06-11 MED ORDER — FLUOXETINE HCL 40 MG PO CAPS: 40.0000 mg | ORAL_CAPSULE | Freq: Every day | ORAL | Status: DC

## 2013-06-11 MED ORDER — AMLODIPINE BESYLATE 10 MG PO TABS: 10.0000 mg | ORAL_TABLET | Freq: Every day | ORAL | Status: DC

## 2013-06-11 MED ORDER — MIRTAZAPINE 15 MG PO TABS: 15.0000 mg | ORAL_TABLET | Freq: Every day | ORAL | Status: DC

## 2013-06-11 MED ORDER — ATORVASTATIN CALCIUM 20 MG PO TABS: 20.0000 mg | ORAL_TABLET | Freq: Every day | ORAL | Status: DC

## 2013-06-11 NOTE — Progress Notes (Signed)
Patient ID: [name], female   DOB: 06/27/1940, 73 y.o.   MRN: [id number]   Location:  [facility]  Code Status: Has living will, youngest son is HCPOA--she will bring [name] a copy next time   Allergies  Allergen Reactions  . Sulfa Drugs Cross Reactors Nausea And Vomiting    Chief Complaint  Patient presents with  . New Evaluation    to establish with the practice, bp uncontrolled--out of meds    HPI: Patient is a 73 y.o. white female seen in the office today to establish with the practice.  She had called [facility] and they referred her here.    Out of bp meds, BUT also took sudafed. Says she has been taking sudafed past couple of days She says at home her bp has been 150/90 at highest. Had been going to Dr. Felipa Eth.     GERD:  Does have difficulty with this.  Says it is time for her EGD.  Food sometimes gets stuck--can be anything not necessarily meat--has to make herself vomit it up.  Water doesn't make it go down.  Has not had esophageal dilatation before that she can recall.  Sees Dr. Olevia Perches.  Is due to cscope--last was 2005.    Has osteoarthritis--s/p bilateral TKA.  Three back surgeries, 2 neck surgeries, left thumb joint replaced.  Also has carpal tunnel that was treated on left.  Has periodic pain, but tylenol es typically takes care of it.  Right knee was giving her a problem--got xrayed after a fall--was ok per Dr. Gladstone Lighter.  Neck bothersome since fall, has some numbness of fingers in right hand which is new.  Sees Dr. Ellene Route.  Had nerve studies with Dr. Jannifer Franklin revealing peripheral neuropathy before, but the pain in her right fingers is now different.  When turns her head a certain way, it causes this.  Has longstanding restless legs syndrome.  On medication for it.  It is helped with it. Does take sinemet for this-has two doses b/c she was waking up early morning with it.  Gets gabapentin there also.    Tachycardia at times.  Thought  we didn't have packet and panicked so nervous today.    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    No longer has headaches--had migraines with vomiting during nursing school, but stopped with menopause.  Keeps imitrex on hand.    Son was visiting 6 mos ago--speech was altered according to him.  Was slurring speech.  Admitted and had testing, but no findings on imaging.  Asa increased to 325mg  from 81mg  at that time.  Hyperlipidemia:  Is controlled with medication.  Had right carotid endarterectomy.  Dr. Trula Slade follows.     Wears glasses--has a little glaucoma, but not bad enough to have surgery.  No hearing loss.  Review of Systems:  Review of Systems  Constitutional: Negative for fever, chills, weight loss and malaise/fatigue.  HENT: Negative for congestion and hearing loss.   Eyes: Negative for blurred vision.       Wears glasses, has mild glaucoma  Respiratory: Negative for cough and shortness of breath.   Cardiovascular: Negative for chest pain, palpitations and leg swelling.  Gastrointestinal: Positive for heartburn. Negative for nausea, vomiting, abdominal pain, constipation, blood in stool and melena.  Genitourinary: Negative for dysuria, urgency and frequency.  Musculoskeletal: Positive  for back pain, falls, joint pain and neck pain. Negative for myalgias.       One fall  Skin: Negative for rash.  Neurological: Positive for sensory change and headaches. Negative for dizziness, speech change, focal weakness and weakness.       H/o tia with dysarthria, restless legs  Endo/Heme/Allergies: Does not bruise/bleed easily.  Psychiatric/Behavioral: Positive for depression. Negative for memory loss. The patient is not nervous/anxious.        Says mood is good now    Past Medical History  Diagnosis Date  . Hypertension   . GERD (gastroesophageal reflux disease)     hx pud '98  .  Arthritis     djd  . Restless leg syndrome   . 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 Dysrhythmia     hx tachy palpitations  . Headache(784.0)     remote hx of migraines  . Neuromuscular disorder     peripheral neuropathy, FEET AND LEGS  . Depression     bh admission '09  . Stroke     ? 6 months ago - tests okay - [facility]   . Cellulitis     10/11/11 hospitalized for cellulitis   . Hyperlipemia   . Peripheral neuropathy     Past Surgical History  Procedure Laterality Date  . Cervical disc surgery x2  2011  . Rt carotid enarterectomy  2011  . Rt knee arthroscopy  '99  . Left knee arthroscopy  2001  . Hematoma evacuation  2011    s/p rt cea  . Joint replacement  '01    total knee replacement, LEFT  . Abdominal hysterectomy  1975    bil oophorectomy  . Back surgery   MANY YRS AGO    laminectomy x3  . Cholecystectomy  1993  . Total knee arthroplasty  09/13/2011    Procedure: TOTAL KNEE ARTHROPLASTY;  Surgeon: Magnus Sinning, MD;  Location: [facility];  Service: Orthopedics;  Laterality: Right;  . Right knee replacement      09/2011   . Carpal tunnel release  07/09/2012    Procedure: CARPAL TUNNEL RELEASE;  Surgeon: Magnus Sinning, MD;  Location: [facility];  Service: Orthopedics;  Laterality: Left;  . Finger arthroplasty  07/09/2012    Procedure: FINGER ARTHROPLASTY;  Surgeon: Magnus Sinning, MD;  Location: [facility];  Service: Orthopedics;  Laterality: Left;  Interposition Arthroplasty [facility] Joint Thumb Left   . Lipoma excision  07/09/2012    Procedure: EXCISION LIPOMA;  Surgeon: Magnus Sinning, MD;  Location: [facility];  Service: Orthopedics;  Laterality: Left;  Excision Lipoma Dorsum Left Wrist    Social History:   reports that she has never smoked. She has never used smokeless tobacco. She reports that she does not drink alcohol or use illicit drugs.  Family History  Problem Relation Age of Onset  . Congestive Heart Failure Father   . Congestive Heart Failure Sister     . Alzheimer's disease Mother   . Diabetes Brother   . Arthritis Brother     Medications: Patient's Medications  New Prescriptions   No medications on file  Previous Medications   ACETAMINOPHEN (TYLENOL) 500 MG TABLET    Take 500 mg by mouth every 6 (six) hours as needed. Pain   AMLODIPINE (NORVASC) 10 MG TABLET    Take 10 mg by mouth at bedtime.   AMOXICILLIN (AMOXIL) 500 MG CAPSULE    Take  500 mg by mouth. Take 4 pills 1 hour prior to dentist appointment   ASPIRIN 325 MG TABLET    Take 325 mg by mouth daily.   ATORVASTATIN (LIPITOR) 20 MG TABLET    Take 20 mg by mouth at bedtime.   CALCIUM CARBONATE 1250 MG CAPSULE    Take 1,250 mg by mouth daily.    CARBIDOPA-LEVODOPA (SINEMET CR) 50-200 MG PER TABLET    Take 1 tablet by mouth at bedtime.   CARBIDOPA-LEVODOPA (SINEMET IR) 25-250 MG PER TABLET    Take 1 tablet by mouth 3 (three) times daily.   CHOLECALCIFEROL (VITAMIN D) 1000 UNITS TABLET    Take 1,000 Units by mouth daily.   FLUOXETINE (PROZAC) 40 MG CAPSULE    Take 40 mg by mouth at bedtime.    GABAPENTIN (NEURONTIN) 800 MG TABLET    Take 1 tablet (800 mg total) by mouth 4 (four) times daily.   HYDROCHLOROTHIAZIDE (HYDRODIURIL) 25 MG TABLET    Take 25 mg by mouth daily.   HYDROCODONE-ACETAMINOPHEN (NORCO/VICODIN) 5-325 MG PER TABLET    Take 1 tablet by mouth every 6 (six) hours as needed (MUST LAST 28 DAYS). Pt referred to pain management on 04/08/13   MIRTAZAPINE (REMERON) 15 MG TABLET    Take 15 mg by mouth at bedtime.   PYRIDOXINE (VITAMIN B-6) 50 MG TABLET    Take 50 mg by mouth daily.  Modified Medications   No medications on file  Discontinued Medications   FLUVIRIN INJ INJECTION         Physical Exam: Filed Vitals:   06/11/13 0905  BP: 186/100  Pulse: 102  Temp: 99.7 F (37.6 C)  TempSrc: Oral  Resp: 20  Height: 5' 4.57" (1.64 m)  Weight: 222 lb 3.2 oz (100.789 kg)  SpO2: 96%  Physical Exam  Constitutional: [name] oriented to person, place, and time. She  appears well-developed and well-nourished. No distress.  HENT:  Head: Normocephalic and atraumatic.  Right Ear: External ear normal.  Left Ear: External ear normal.  Nose: Nose normal.  Mouth/Throat: Oropharynx is clear and moist. No oropharyngeal exudate.  Eyes: Conjunctivae and EOM are normal. Pupils are equal, round, and reactive to light.  Neck: Normal range of motion. Neck supple. No JVD present. No tracheal deviation present. No thyromegaly present.  No carotid bruits, scar right neck from endarterectomy  Cardiovascular: Regular rhythm, normal heart sounds and intact distal pulses.  Exam reveals no gallop and no friction rub.   No murmur heard. tachycardic  Pulmonary/Chest: Effort normal and breath sounds normal. No respiratory distress.  Abdominal: Soft. Bowel sounds are normal. She exhibits no distension. There is no tenderness.  Genitourinary:  No suprapubic tenderness  Lymphadenopathy:    She has no cervical adenopathy.  Neurological: [name] alert and oriented to person, place, and time. No cranial nerve deficit.  Skin: Skin is warm and dry.  Psychiatric: She has a normal mood and affect.    Labs reviewed: Basic Metabolic Panel:  Recent Labs  07/09/12 1250  NA 133*  K 4.3  CL 92*  CO2 23  GLUCOSE 155*  BUN 22  CREATININE 0.61  CALCIUM 9.5  CBC:  Recent Labs  07/09/12 1250  WBC 11.5*  HGB 13.2  HCT 40.4  MCV 98.5  PLT PLATELET CLUMPS NOTED ON SMEAR, COUNT APPEARS ADEQUATE   Lipid Panel: No results found for this basename: CHOL, HDL, LDLCALC, TRIG, CHOLHDL, LDLDIRECT,  in the last 8760 hours Lab Results  Component  Value Date   HGBA1C 5.3 10/12/2011    Past Procedures: Last cscope was ~2002  Assessment/Plan 1. HTN (hypertension) -bp very high today, but asymptomatic and this is due to being out of meds -will refill current meds -also took sudafed early this am and is nervous as new pt -return for bp check in 2 wks with MA and check bp at home--let  me know if staying high  2. GERD (gastroesophageal reflux disease) -occasionally problematic, may need to address further at next visit -having dysphagia of solids and needs to f/u with Dr. Olevia Perches  3. Polyneuropathy in other diseases classified elsewhere -cont sinemet, gabapentin--following with Dr. Jannifer Franklin  4. Restless legs syndrome (RLS) -cont sinemet, gabapentin  5. Hyperlipemia -f/u lipid panel -cont statin therapy  6. Depression -says mood is good but does want to go back on her prozac  7. Arthritis -of knees, DDD of neck and lumbar spine, finger joints -stable with tylenol es occasionally  8. Preventative health care -mammogram and colonoscopy referrals done for routine screening -also having dysphagia so sent to Dr. Olevia Perches for EGD, as well  Labs/tests ordered:  Cbc, cmp, flp in 2 wks when comes for bp check Next appt:  bp check with ma in 2 wks, then next available physical with me for full exam

## 2013-06-19 ENCOUNTER — Telehealth: Payer: Self-pay

## 2013-06-19 MED ORDER — HYDROCODONE-ACETAMINOPHEN 5-325 MG PO TABS: 1.0000 | ORAL_TABLET | Freq: Four times a day (QID) | ORAL | Status: DC | PRN

## 2013-06-19 NOTE — Telephone Encounter (Signed)
Patient called stating she will not be seen at the pain clinic until Feb and would like to know if Dr Jannifer Franklin will prescribe another month of Hydrocodone for her.  Says she usually gets #90 per month.  If approved, she would like to pick up the Rx when it's ready.  Call back number [phone].  Thank you.

## 2013-06-19 NOTE — Telephone Encounter (Signed)
I called the patient. I wrote a prescription for hydrocodone, they are to pick it up.

## 2013-06-22 NOTE — Telephone Encounter (Signed)
I called and left VM that Rx is ready for pick up.

## 2013-06-25 ENCOUNTER — Other Ambulatory Visit: Payer: Medicare Other

## 2013-06-26 ENCOUNTER — Other Ambulatory Visit: Payer: Medicare Other

## 2013-06-30 ENCOUNTER — Other Ambulatory Visit: Payer: Medicare Other

## 2013-07-01 ENCOUNTER — Ambulatory Visit: Payer: Medicare Other

## 2013-07-02 ENCOUNTER — Other Ambulatory Visit: Payer: Medicare Other

## 2013-07-20 ENCOUNTER — Other Ambulatory Visit: Payer: Self-pay | Admitting: Neurology

## 2013-07-20 MED ORDER — HYDROCODONE-ACETAMINOPHEN 5-325 MG PO TABS: 1.0000 | ORAL_TABLET | Freq: Four times a day (QID) | ORAL | Status: DC | PRN

## 2013-07-20 NOTE — Telephone Encounter (Signed)
Called patient to inform her that her Rx was ready to be picked up at the front desk and if she has any other problems, questions or concerns to call the office. Patient verbalized understanding. °

## 2013-07-20 NOTE — Telephone Encounter (Signed)
Would like written Rx for Hydrocodone--please call when ready--thank you.

## 2013-07-23 ENCOUNTER — Encounter: Payer: Medicare Other | Admitting: Internal Medicine

## 2013-08-07 ENCOUNTER — Ambulatory Visit: Payer: Medicare Other

## 2013-08-11 ENCOUNTER — Other Ambulatory Visit: Payer: Medicare Other

## 2013-08-13 ENCOUNTER — Encounter: Payer: Medicare Other | Admitting: Internal Medicine

## 2013-08-17 ENCOUNTER — Other Ambulatory Visit: Payer: Self-pay | Admitting: Neurology

## 2013-08-17 MED ORDER — HYDROCODONE-ACETAMINOPHEN 5-325 MG PO TABS: 1.0000 | ORAL_TABLET | Freq: Four times a day (QID) | ORAL | Status: DC | PRN

## 2013-08-17 NOTE — Telephone Encounter (Signed)
Pt calling for her written prescription for HYDROcodone-acetaminophen (NORCO/VICODIN) 5-325 MG per tablet please call when ready for pick up. Pt also wants Dr. Jannifer Franklin to know that her apt for Pain Management apt is 09/28/13.

## 2013-08-17 NOTE — Telephone Encounter (Signed)
Patient was left a message that her rx was ready to be picked up at the front desk.

## 2013-08-18 ENCOUNTER — Telehealth: Payer: Self-pay | Admitting: Neurology

## 2013-08-18 NOTE — Telephone Encounter (Signed)
Patient needs written Rx for Hydrocodone--states called yesterday but I see no message--please call patient when ready--thank you.

## 2013-08-18 NOTE — Telephone Encounter (Signed)
Please see previous note, patient was called yesterday regarding Rx.   Amelia Jo, [place] at 08/17/2013 1:21 PM    Status: Signed        Patient was left a message that her rx was ready to be picked up at the front desk.   I called the patient back.  Advised we called yesterday to state Rx was ready.  Patient says she did not get that message.  She will be in today to get Rx.

## 2013-08-21 ENCOUNTER — Ambulatory Visit: Payer: Medicare Other

## 2013-08-25 ENCOUNTER — Other Ambulatory Visit: Payer: Medicare Other

## 2013-09-02 DIAGNOSIS — S42402A Unspecified fracture of lower end of left humerus, initial encounter for closed fracture: Secondary | ICD-10-CM

## 2013-09-02 HISTORY — DX: Unspecified fracture of lower end of left humerus, initial encounter for closed fracture: S42.402A

## 2013-09-09 ENCOUNTER — Telehealth: Payer: Self-pay | Admitting: Neurology

## 2013-09-09 NOTE — Telephone Encounter (Signed)
Patient needs written Rx for Hydrocodone--will pick up-please call when ready.

## 2013-09-09 NOTE — Telephone Encounter (Signed)
Rx was last written on 03/16, noting Rx must last 28 days.  It is too soon to refill at this time.  I called the patient back.  Got no answer.  Left message.

## 2013-09-10 ENCOUNTER — Encounter: Payer: Medicare Other | Admitting: Internal Medicine

## 2013-09-10 ENCOUNTER — Telehealth: Payer: Self-pay | Admitting: Neurology

## 2013-09-10 NOTE — Telephone Encounter (Signed)
Patient calling to ask about when script will be filled per previous telephone message, advised patient that it is too soon to refill at this time and she wants to know when it will be ready. Please call and advise patient.

## 2013-09-10 NOTE — Telephone Encounter (Signed)
I called the patient back.  Got no answer.  Left message. 

## 2013-09-10 NOTE — Telephone Encounter (Signed)
Patient called requesting a refill on Hydrocodone.  Rx was last written on 03/16, noting it must last 28 days.  I called the patient back a few minutes ago and advised it was a bit too soon, and patient understood.  She just called back and said she was in a lot of pain and took some extra pills and is running low on meds so she would like to either get the refill early or have something else prescribed to bridge her until refill is due next week.  Please advise.  Thank you.

## 2013-09-10 NOTE — Telephone Encounter (Signed)
I called back.  Spoke with the patient.  Advised Rx must last 28 days.  She verbalized understanding.

## 2013-09-10 NOTE — Telephone Encounter (Signed)
Pt is calling back stating she had to take the medicine a couple of times every 4 hours to extreme pain. This is why pt is running short. If she can't get this is there something else that can be called in for the pain until Monday. Please call pt back concerning this matter. Thanks

## 2013-09-10 NOTE — Telephone Encounter (Signed)
Prescription must last 28 days, no early refills.

## 2013-09-14 ENCOUNTER — Other Ambulatory Visit: Payer: Self-pay | Admitting: *Deleted

## 2013-09-14 MED ORDER — HYDROCODONE-ACETAMINOPHEN 5-325 MG PO TABS: 1.0000 | ORAL_TABLET | Freq: Four times a day (QID) | ORAL | Status: DC | PRN

## 2013-09-14 NOTE — Telephone Encounter (Signed)
Patient was notified that her prescription is ready at the front desk to be picked up.  Patient verbalized understanding.

## 2013-09-17 ENCOUNTER — Emergency Department (HOSPITAL_BASED_OUTPATIENT_CLINIC_OR_DEPARTMENT_OTHER): Payer: Medicare Other

## 2013-09-17 ENCOUNTER — Emergency Department (HOSPITAL_BASED_OUTPATIENT_CLINIC_OR_DEPARTMENT_OTHER)
Admission: EM | Admit: 2013-09-17 | Discharge: 2013-09-17 | Disposition: A | Discharge status: Home / Self Care | Payer: Medicare Other | Attending: Emergency Medicine | Admitting: Emergency Medicine

## 2013-09-17 ENCOUNTER — Encounter (HOSPITAL_BASED_OUTPATIENT_CLINIC_OR_DEPARTMENT_OTHER): Payer: Self-pay | Admitting: Emergency Medicine

## 2013-09-17 DIAGNOSIS — F3289 Other specified depressive episodes: Secondary | ICD-10-CM | POA: Insufficient documentation

## 2013-09-17 DIAGNOSIS — Z872 Personal history of diseases of the skin and subcutaneous tissue: Secondary | ICD-10-CM | POA: Insufficient documentation

## 2013-09-17 DIAGNOSIS — Z7982 Long term (current) use of aspirin: Secondary | ICD-10-CM | POA: Insufficient documentation

## 2013-09-17 DIAGNOSIS — Z79899 Other long term (current) drug therapy: Secondary | ICD-10-CM | POA: Insufficient documentation

## 2013-09-17 DIAGNOSIS — E785 Hyperlipidemia, unspecified: Secondary | ICD-10-CM | POA: Insufficient documentation

## 2013-09-17 DIAGNOSIS — G609 Hereditary and idiopathic neuropathy, unspecified: Secondary | ICD-10-CM | POA: Insufficient documentation

## 2013-09-17 DIAGNOSIS — G2581 Restless legs syndrome: Secondary | ICD-10-CM | POA: Insufficient documentation

## 2013-09-17 DIAGNOSIS — K219 Gastro-esophageal reflux disease without esophagitis: Secondary | ICD-10-CM | POA: Insufficient documentation

## 2013-09-17 DIAGNOSIS — IMO0002 Reserved for concepts with insufficient information to code with codable children: Secondary | ICD-10-CM | POA: Insufficient documentation

## 2013-09-17 DIAGNOSIS — M129 Arthropathy, unspecified: Secondary | ICD-10-CM | POA: Insufficient documentation

## 2013-09-17 DIAGNOSIS — S52009A Unspecified fracture of upper end of unspecified ulna, initial encounter for closed fracture: Secondary | ICD-10-CM | POA: Insufficient documentation

## 2013-09-17 DIAGNOSIS — Y929 Unspecified place or not applicable: Secondary | ICD-10-CM | POA: Insufficient documentation

## 2013-09-17 DIAGNOSIS — Y939 Activity, unspecified: Secondary | ICD-10-CM | POA: Insufficient documentation

## 2013-09-17 DIAGNOSIS — I10 Essential (primary) hypertension: Secondary | ICD-10-CM | POA: Insufficient documentation

## 2013-09-17 DIAGNOSIS — S52209A Unspecified fracture of shaft of unspecified ulna, initial encounter for closed fracture: Secondary | ICD-10-CM

## 2013-09-17 DIAGNOSIS — F329 Major depressive disorder, single episode, unspecified: Secondary | ICD-10-CM | POA: Insufficient documentation

## 2013-09-17 DIAGNOSIS — Z8673 Personal history of transient ischemic attack (TIA), and cerebral infarction without residual deficits: Secondary | ICD-10-CM | POA: Insufficient documentation

## 2013-09-17 MED ORDER — HYDROCODONE-ACETAMINOPHEN 5-325 MG PO TABS: 1.0000 | ORAL_TABLET | Freq: Four times a day (QID) | ORAL | Status: DC | PRN

## 2013-09-17 NOTE — Discharge Instructions (Signed)
Cast or Splint Care  Casts and splints support injured limbs and keep bones from moving while they heal. It is important to care for your cast or splint at home.   HOME CARE INSTRUCTIONS   Keep the cast or splint uncovered during the drying period. It can take 24 to 48 hours to dry if it is made of plaster. A fiberglass cast will dry in less than 1 hour.   Do not rest the cast on anything harder than a pillow for the first 24 hours.   Do not put weight on your injured limb or apply pressure to the cast until your health care provider gives you permission.   Keep the cast or splint dry. Wet casts or splints can lose their shape and may not support the limb as well. A wet cast that has lost its shape can also create harmful pressure on your skin when it dries. Also, wet skin can become infected.   Cover the cast or splint with a plastic bag when bathing or when out in the rain or snow. If the cast is on the trunk of the body, take sponge baths until the cast is removed.   If your cast does become wet, dry it with a towel or a blow dryer on the cool setting only.   Keep your cast or splint clean. Soiled casts may be wiped with a moistened cloth.   Do not place any hard or soft foreign objects under your cast or splint, such as cotton, toilet paper, lotion, or powder.   Do not try to scratch the skin under the cast with any object. The object could get stuck inside the cast. Also, scratching could lead to an infection. If itching is a problem, use a blow dryer on a cool setting to relieve discomfort.   Do not trim or cut your cast or remove padding from inside of it.   Exercise all joints next to the injury that are not immobilized by the cast or splint. For example, if you have a long leg cast, exercise the hip joint and toes. If you have an arm cast or splint, exercise the shoulder, elbow, thumb, and fingers.   Elevate your injured arm or leg on 1 or 2 pillows for the first 1 to 3 days to decrease  swelling and pain.It is best if you can comfortably elevate your cast so it is higher than your heart.  SEEK MEDICAL CARE IF:    Your cast or splint cracks.   Your cast or splint is too tight or too loose.   You have unbearable itching inside the cast.   Your cast becomes wet or develops a soft spot or area.   You have a bad smell coming from inside your cast.   You get an object stuck under your cast.   Your skin around the cast becomes red or raw.   You have new pain or worsening pain after the cast has been applied.  SEEK IMMEDIATE MEDICAL CARE IF:    You have fluid leaking through the cast.   You are unable to move your fingers or toes.   You have discolored (blue or white), cool, painful, or very swollen fingers or toes beyond the cast.   You have tingling or numbness around the injured area.   You have severe pain or pressure under the cast.   You have any difficulty with your breathing or have shortness of breath.   You have chest 

## 2013-09-17 NOTE — ED Provider Notes (Signed)
Medical screening examination/treatment/procedure(s) were performed by non-physician practitioner and as supervising physician I was immediately available for consultation/collaboration.     [name], MD 09/17/13 [phone]

## 2013-09-17 NOTE — ED Provider Notes (Signed)
CSN: [id number]     Arrival date & time 09/17/13  1214 History   First MD Initiated Contact with Patient 09/17/13 1242     Chief Complaint  Patient presents with  . Elbow Injury     (Consider location/radiation/quality/duration/timing/severity/associated sxs/prior Treatment) HPI Comments: Pt states that she struck her left elbow 2 days ago and she developed pain and swelling and the symptoms have continued:pt states that she has no previous injury to the area. Denies numbness or weakness. No fever  The history is provided by the patient. No language interpreter was used.    Past Medical History  Diagnosis Date  . Hypertension   . GERD (gastroesophageal reflux disease)     hx pud '98  . Arthritis     djd  . Restless leg syndrome   . 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 Dysrhythmia     hx tachy palpitations  . Headache(784.0)     remote hx of migraines  . Neuromuscular disorder     peripheral neuropathy, FEET AND LEGS  . Depression     bh admission '09  . Stroke     ? 6 months ago - tests okay - [facility]   . Cellulitis     10/11/11 hospitalized for cellulitis   . Hyperlipemia   . Peripheral neuropathy    Past Surgical History  Procedure Laterality Date  . Cervical disc surgery x2  2011  . Rt carotid enarterectomy  2011  . Rt knee arthroscopy  '99  . Left knee arthroscopy  2001  . Hematoma evacuation  2011    s/p rt cea  . Joint replacement  '01    total knee replacement, LEFT  . Abdominal hysterectomy  1975    bil oophorectomy  . Back surgery   MANY YRS AGO    laminectomy x3  . Cholecystectomy  1993  . Total knee arthroplasty  09/13/2011    Procedure: TOTAL KNEE ARTHROPLASTY;  Surgeon: Magnus Sinning, MD;  Location: [facility];  Service: Orthopedics;  Laterality: Right;  . Right knee replacement      09/2011   . Carpal tunnel release  07/09/2012    Procedure: CARPAL TUNNEL RELEASE;  Surgeon: Magnus Sinning, MD;  Location: [facility];  Service: Orthopedics;   Laterality: Left;  . Finger arthroplasty  07/09/2012    Procedure: FINGER ARTHROPLASTY;  Surgeon: Magnus Sinning, MD;  Location: [facility];  Service: Orthopedics;  Laterality: Left;  Interposition Arthroplasty [facility] Joint Thumb Left   . Lipoma excision  07/09/2012    Procedure: EXCISION LIPOMA;  Surgeon: Magnus Sinning, MD;  Location: [facility];  Service: Orthopedics;  Laterality: Left;  Excision Lipoma Dorsum Left Wrist    Family History  Problem Relation Age of Onset  . Congestive Heart Failure Father   . Congestive Heart Failure Sister   . Alzheimer's disease Mother   . Diabetes Brother   . Arthritis Brother    History  Substance Use Topics  . Smoking status: Never Smoker   . Smokeless tobacco: Never Used  . Alcohol Use: No   OB History   Grav Para Term Preterm Abortions TAB SAB Ect Mult Living                 Review of Systems  Constitutional: Negative.   Respiratory: Negative.   Cardiovascular: Negative.       Allergies  Sulfa drugs cross reactors  Home Medications  Prior to Admission medications   Medication Sig Start Date End Date Taking? Authorizing Provider  acetaminophen (TYLENOL) 500 MG tablet Take 500 mg by mouth every 6 (six) hours as needed. Pain    Historical Provider, MD  amLODipine (NORVASC) 10 MG tablet Take 1 tablet (10 mg total) by mouth at bedtime. 06/11/13   [name] L [name], DO  amoxicillin (AMOXIL) 500 MG capsule Take 500 mg by mouth. Take 4 pills 1 hour prior to dentist appointment    Historical Provider, MD  aspirin 325 MG tablet Take 325 mg by mouth daily.    Historical Provider, MD  atorvastatin (LIPITOR) 20 MG tablet Take 1 tablet (20 mg total) by mouth at bedtime. 06/11/13   [name] L [name], DO  calcium carbonate 1250 MG capsule Take 1,250 mg by mouth daily.     Historical Provider, MD  carbidopa-levodopa (SINEMET CR) 50-200 MG per tablet Take 1 tablet by mouth at bedtime. 04/08/13   Dennie Bible, NP  carbidopa-levodopa (SINEMET IR) 25-250 MG  per tablet Take 1 tablet by mouth 3 (three) times daily. 04/08/13   Dennie Bible, NP  cholecalciferol (VITAMIN D) 1000 UNITS tablet Take 1,000 Units by mouth daily.    Historical Provider, MD  FLUoxetine (PROZAC) 40 MG capsule Take 1 capsule (40 mg total) by mouth at bedtime. 06/11/13   [name] L [name], DO  gabapentin (NEURONTIN) 800 MG tablet Take 1 tablet (800 mg total) by mouth 4 (four) times daily. 04/08/13   Dennie Bible, NP  hydrochlorothiazide (HYDRODIURIL) 25 MG tablet Take 1 tablet (25 mg total) by mouth daily. 06/11/13   [name] L [name], DO  HYDROcodone-acetaminophen (NORCO/VICODIN) 5-325 MG per tablet Take 1 tablet by mouth every 6 (six) hours as needed (MUST LAST 28 DAYS). Pt referred to pain management on 04/08/13 09/14/13   Kathrynn Ducking, MD  HYDROcodone-acetaminophen (NORCO/VICODIN) 5-325 MG per tablet Take 1-2 tablets by mouth every 6 (six) hours as needed. 09/17/13   Glendell Docker, NP  mirtazapine (REMERON) 15 MG tablet Take 1 tablet (15 mg total) by mouth at bedtime. 06/11/13   [name] L [name], DO  pyridOXINE (VITAMIN B-6) 50 MG tablet Take 50 mg by mouth daily.    Historical Provider, MD  SUMAtriptan (IMITREX) 50 MG tablet Take 1 tablet (50 mg total) by mouth every 2 (two) hours as needed for migraine or headache. May repeat in 2 hours if headache persists or recurs. 06/11/13   [name] L [name], DO   BP 144/96  Pulse 94  Temp(Src) 98.7 F (37.1 C) (Oral)  Resp 20  Ht 5\' 5"  (1.651 m)  Wt 182 lb (82.555 kg)  BMI 30.29 kg/m2  SpO2 96% Physical Exam  Nursing note and vitals reviewed. Constitutional: [name] oriented to person, place, and time. She appears well-developed and well-nourished.  Cardiovascular: Normal rate and regular rhythm.   Pulmonary/Chest: Effort normal and breath sounds normal.  Musculoskeletal: Normal range of motion.  Swelling and bruising noted to the posterior aspect of the left elbow. neurovascularly intacts  Neurological: [name] alert and oriented  to person, place, and time.  Skin: Skin is warm and dry.    ED Course  Procedures (including critical care time) Labs Review Labs Reviewed - No data to display  Imaging Review Dg Elbow Complete Left  09/17/2013   CLINICAL DATA:  Elbow injury with pain  EXAM: LEFT ELBOW - COMPLETE 3+ VIEW  COMPARISON:  None.  FINDINGS: There is bony fragment with discontinuity in  the anterior proximal aspect of the ulna suspicious for fracture. There is no dislocation. No posterior fat pad is identified.  IMPRESSION: There is bony fragment with discontinuity in the anterior proximal aspect of the ulna suspicious for fracture.   Electronically Signed   By: Abelardo Diesel M.D.   On: 09/17/2013 12:50     EKG Interpretation None      MDM   Final diagnoses:  Ulna fracture    Will splint and have follow up with her orthopedist. Neurovascularly intact    Glendell Docker, NP 09/17/13 1319

## 2013-09-17 NOTE — ED Notes (Signed)
Left elbow pain x 2 days.  States she struck elbow on a cabinet and continues to have pain and swelling.

## 2013-09-28 ENCOUNTER — Telehealth: Payer: Self-pay | Admitting: Neurology

## 2013-09-28 DIAGNOSIS — M545 Low back pain, unspecified: Secondary | ICD-10-CM

## 2013-09-28 DIAGNOSIS — G8929 Other chronic pain: Secondary | ICD-10-CM

## 2013-09-28 NOTE — Telephone Encounter (Signed)
I will make a referral to Dr. Ace Gins, apparently Dr. Hardin Negus discharged her from his practice for not showing up on 2 occasions.

## 2013-09-28 NOTE — Telephone Encounter (Signed)
Patient had to cancel appt today for pain management--car problems--patient says has cancelled appts twice therefore pain management will not reschedule--please call.

## 2013-09-28 NOTE — Telephone Encounter (Signed)
Spoke with patient and Dr Hardin Negus office and she was discharged because of two no shows. The patient said that the first time her aunt died and she had to go out of town, second time had car trouble. Would like to see another pain management physician.

## 2013-10-03 ENCOUNTER — Other Ambulatory Visit: Payer: Self-pay | Admitting: Internal Medicine

## 2013-10-05 ENCOUNTER — Other Ambulatory Visit: Payer: Self-pay | Admitting: Internal Medicine

## 2013-10-12 ENCOUNTER — Other Ambulatory Visit: Payer: Self-pay | Admitting: *Deleted

## 2013-10-12 MED ORDER — HYDROCODONE-ACETAMINOPHEN 5-325 MG PO TABS: 1.0000 | ORAL_TABLET | Freq: Four times a day (QID) | ORAL | Status: DC | PRN

## 2013-10-12 NOTE — Telephone Encounter (Signed)
Patient calling for refill of HYDROcodone-acetaminophen (NORCO/VICODIN) 5-325 MG per tablet.  Please call when ready for pick up.

## 2013-10-12 NOTE — Telephone Encounter (Signed)
Request forwarded to provider for approval  

## 2013-10-12 NOTE — Telephone Encounter (Signed)
Left a message that the prescription is ready for pickup at the front desk. 

## 2013-10-22 ENCOUNTER — Other Ambulatory Visit: Payer: Self-pay | Admitting: *Deleted

## 2013-10-22 DIAGNOSIS — I10 Essential (primary) hypertension: Secondary | ICD-10-CM

## 2013-10-22 DIAGNOSIS — G63 Polyneuropathy in diseases classified elsewhere: Secondary | ICD-10-CM

## 2013-10-22 DIAGNOSIS — E785 Hyperlipidemia, unspecified: Secondary | ICD-10-CM

## 2013-10-27 ENCOUNTER — Other Ambulatory Visit: Payer: Medicare Other

## 2013-10-29 ENCOUNTER — Encounter: Payer: Medicare Other | Admitting: Internal Medicine

## 2013-11-02 ENCOUNTER — Other Ambulatory Visit: Payer: Self-pay | Admitting: Internal Medicine

## 2013-11-05 ENCOUNTER — Other Ambulatory Visit: Payer: Self-pay | Admitting: Internal Medicine

## 2013-11-09 ENCOUNTER — Telehealth: Payer: Self-pay | Admitting: Neurology

## 2013-11-09 MED ORDER — HYDROCODONE-ACETAMINOPHEN 5-325 MG PO TABS: 1.0000 | ORAL_TABLET | Freq: Four times a day (QID) | ORAL | Status: DC | PRN

## 2013-11-09 NOTE — Telephone Encounter (Signed)
Pt calling requesting refill on Hydrocodone. Please advise

## 2013-11-09 NOTE — Telephone Encounter (Signed)
This patient has now been referred to 2 separate pain centers, and has been discharged from 1, and does not plan on going back to the other. [name] using hydrocodone too quickly.  It has been 28 days since the last hydrocodone prescription, I will refill, but I will not give her any prescriptions early.

## 2013-11-09 NOTE — Telephone Encounter (Signed)
Patient requesting refill of  HYDROcodone-acetaminophen (NORCO/VICODIN) 5-325 MG per tablet.  She also stated she went to pain clinic and they put her on muscle relaxer's and mobic.  It has not helped with the pain, but she doe's plan to go back.

## 2013-11-10 ENCOUNTER — Telehealth: Payer: Self-pay | Admitting: *Deleted

## 2013-11-10 NOTE — Telephone Encounter (Signed)
Called pt to inform her that her Rx was ready to be picked up at the front desk and if she has any other problems, questions or concerns to call the office. Pt verbalized understanding. °

## 2013-11-17 ENCOUNTER — Other Ambulatory Visit: Payer: Self-pay

## 2013-12-07 ENCOUNTER — Other Ambulatory Visit: Payer: Self-pay | Admitting: Neurology

## 2013-12-07 ENCOUNTER — Telehealth: Payer: Self-pay | Admitting: Neurology

## 2013-12-07 MED ORDER — HYDROCODONE-ACETAMINOPHEN 5-325 MG PO TABS: 1.0000 | ORAL_TABLET | Freq: Four times a day (QID) | ORAL | Status: DC | PRN

## 2013-12-07 NOTE — Telephone Encounter (Signed)
Duplicate message.  [name] just called this patient to advise Rx was ready at front desk (please se previous note)

## 2013-12-07 NOTE — Telephone Encounter (Signed)
Patient calling to check on the status of her Hydrocodone refill, please return call to patient and advise.

## 2013-12-07 NOTE — Telephone Encounter (Signed)
Patient requesting refill of hydrocodone to pick up.

## 2013-12-07 NOTE — Telephone Encounter (Signed)
Called pt and left message informing her that her Rx was ready to be picked up at the front desk and if she has any other problems, questions or concerns to call the office.  °

## 2013-12-07 NOTE — Telephone Encounter (Signed)
Dr Willis is out of the office, forwarding request to [facility] for review.   

## 2013-12-31 ENCOUNTER — Other Ambulatory Visit: Payer: Self-pay | Admitting: Internal Medicine

## 2014-01-04 ENCOUNTER — Other Ambulatory Visit: Payer: Self-pay | Admitting: Neurology

## 2014-01-04 MED ORDER — HYDROCODONE-ACETAMINOPHEN 5-325 MG PO TABS: 1.0000 | ORAL_TABLET | Freq: Four times a day (QID) | ORAL | Status: DC | PRN

## 2014-01-04 NOTE — Telephone Encounter (Signed)
Called pt to inform her that her Rx was ready to be picked up at the front desk and if she has any other problems, questions or concerns to call the office. Pt verbalized understanding. °

## 2014-01-04 NOTE — Telephone Encounter (Signed)
Today is the 28th day.   Request forwarded to provider for approval

## 2014-01-04 NOTE — Telephone Encounter (Signed)
Patient requesting Rx refill for HYDROcodone-acetaminophen (NORCO/VICODIN) 5-325 MG per tablet.  Please call when ready for pick up and can leave message if not available.  Thanks

## 2014-01-05 ENCOUNTER — Other Ambulatory Visit: Payer: Medicare Other

## 2014-01-11 ENCOUNTER — Encounter: Payer: Self-pay | Admitting: Internal Medicine

## 2014-01-11 ENCOUNTER — Ambulatory Visit (INDEPENDENT_AMBULATORY_CARE_PROVIDER_SITE_OTHER): Payer: Medicare Other | Admitting: Internal Medicine

## 2014-01-11 VITALS — BP 122/74 | HR 100 | Temp 98.5°F | Resp 10 | Ht 64.0 in | Wt 237.0 lb

## 2014-01-11 DIAGNOSIS — R1319 Other dysphagia: Secondary | ICD-10-CM

## 2014-01-11 DIAGNOSIS — E785 Hyperlipidemia, unspecified: Secondary | ICD-10-CM

## 2014-01-11 DIAGNOSIS — S42402S Unspecified fracture of lower end of left humerus, sequela: Secondary | ICD-10-CM

## 2014-01-11 DIAGNOSIS — G63 Polyneuropathy in diseases classified elsewhere: Secondary | ICD-10-CM

## 2014-01-11 DIAGNOSIS — S42309S Unspecified fracture of shaft of humerus, unspecified arm, sequela: Secondary | ICD-10-CM

## 2014-01-11 DIAGNOSIS — G4700 Insomnia, unspecified: Secondary | ICD-10-CM

## 2014-01-11 DIAGNOSIS — Z9289 Personal history of other medical treatment: Secondary | ICD-10-CM

## 2014-01-11 DIAGNOSIS — B3789 Other sites of candidiasis: Secondary | ICD-10-CM

## 2014-01-11 DIAGNOSIS — I10 Essential (primary) hypertension: Secondary | ICD-10-CM

## 2014-01-11 DIAGNOSIS — Z9189 Other specified personal risk factors, not elsewhere classified: Secondary | ICD-10-CM

## 2014-01-11 MED ORDER — NYSTATIN 100000 UNIT/GM EX POWD: CUTANEOUS | Status: DC

## 2014-01-11 NOTE — Progress Notes (Signed)
Patient ID: [name], female   DOB: 22-Jan-1941, 73 y.o.   MRN: [id number]   Location:  [facility]   Allergies  Allergen Reactions  . Sulfa Drugs Cross Reactors Nausea And Vomiting    Chief Complaint  Patient presents with  . Annual Exam    Yearly check-up, fasting for any labs due   . Edema    Bilateral ankle swelling x 3 weeks     HPI: Patient is a 73 y.o.  White female seen in the office today for her annual exam and c/o bilateral ankle swelling for 3 wks.  Had been on and off.  Has been driving a lot and had legs hanging down a lot more.  No increase in shortness of breath.    Also needs to have her labs.  Has had 4 deaths in her family in 90 mos, 2 of them she's been health care power of attorney so she's been very busy.    When had left thumb surgery, had lipoma removed on left wrist.  Now has one on right wrist.  Broke elbow on left side.  Went to catch a plate falling out of the cabinet an hit elbow on granite countertop.  Went to urgent care and had it in a hard splint and in sling for 3-4 weeks.  Pain is gone.  Moving it well.  Blood pressure great today.  Checked it at home lately and it's been good there, too. Highest was 140/90 after doing some work around the house.    Saw Dr. Ellene Route several weeks ago for difficulty with her neck.  She was worried she'd messed up her prior surgery.  He recommended the book--7 steps for a pain free life with exercises for neck and back.  [name] enjoying this.  By Noah Delaine.    Has not yet followed up with Dr. Olevia Perches about her dysphagia to solids--says she will do that.  Has not been able to exercise too much.  Dr. Gladstone Lighter said her left total knee is about done and she is not to go on treadmill--really does not want repeat replacement.  Told to walk outside instead.  Takes remeron for sleep.  Doesn't use it every night.  Has gotten into coloring and that helps her fall asleep.    Her husband  died four years ago.  She has very supportive sons.    Review of Systems:  Review of Systems  Constitutional: Negative for fever and malaise/fatigue.  HENT: Negative for congestion and hearing loss.   Eyes: Negative for blurred vision.       Wears glasses  Cardiovascular: Positive for leg swelling. Negative for chest pain.  Gastrointestinal: Negative for abdominal pain, constipation, blood in stool and melena.  Genitourinary: Negative for dysuria.  Musculoskeletal: Positive for joint pain. Negative for falls.  Skin: Negative for rash.  Neurological: Positive for tingling and sensory change. Negative for dizziness, loss of consciousness, weakness and headaches.  Psychiatric/Behavioral: Negative for depression and memory loss. The patient is nervous/anxious and has insomnia.     Past Medical History  Diagnosis Date  . Hypertension   . GERD (gastroesophageal reflux disease)     hx pud '98  . Arthritis     djd  . Restless leg syndrome   . 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 Dysrhythmia     hx tachy palpitations  . Headache(784.0)  remote hx of migraines  . Neuromuscular disorder     peripheral neuropathy, FEET AND LEGS  . Depression     bh admission '09  . Stroke     ? 6 months ago - tests okay - [facility]   . Cellulitis     10/11/11 hospitalized for cellulitis   . Hyperlipemia   . Peripheral neuropathy   . Elbow fracture, left April 2015    Past Surgical History  Procedure Laterality Date  . Cervical disc surgery x2  2011  . Rt carotid enarterectomy  2011  . Rt knee arthroscopy  '99  . Left knee arthroscopy  2001  . Hematoma evacuation  2011    s/p rt cea  . Joint replacement  '01    total knee replacement, LEFT  . Abdominal hysterectomy  1975    bil oophorectomy  . Back surgery   MANY YRS AGO    laminectomy x3  . Cholecystectomy  1993  . Total knee arthroplasty  09/13/2011    Procedure: TOTAL KNEE ARTHROPLASTY;  Surgeon: Magnus Sinning, MD;   Location: [facility];  Service: Orthopedics;  Laterality: Right;  . Right knee replacement      09/2011   . Carpal tunnel release  07/09/2012    Procedure: CARPAL TUNNEL RELEASE;  Surgeon: Magnus Sinning, MD;  Location: [facility];  Service: Orthopedics;  Laterality: Left;  . Finger arthroplasty  07/09/2012    Procedure: FINGER ARTHROPLASTY;  Surgeon: Magnus Sinning, MD;  Location: [facility];  Service: Orthopedics;  Laterality: Left;  Interposition Arthroplasty [facility] Joint Thumb Left   . Lipoma excision  07/09/2012    Procedure: EXCISION LIPOMA;  Surgeon: Magnus Sinning, MD;  Location: [facility];  Service: Orthopedics;  Laterality: Left;  Excision Lipoma Dorsum Left Wrist     Social History:   reports that she has never smoked. She has never used smokeless tobacco. She reports that she does not drink alcohol or use illicit drugs.  Family History  Problem Relation Age of Onset  . Congestive Heart Failure Father   . Congestive Heart Failure Sister   . Alzheimer's disease Mother   . Diabetes Brother   . Arthritis Brother     Medications: Patient's Medications  New Prescriptions   No medications on file  Previous Medications   ACETAMINOPHEN (TYLENOL) 500 MG TABLET    Take 500 mg by mouth every 6 (six) hours as needed. Pain   AMLODIPINE (NORVASC) 10 MG TABLET    TAKE 1 TABLET BY MOUTH EVERY NIGHT AT BEDTIME   AMOXICILLIN (AMOXIL) 500 MG CAPSULE    Take 500 mg by mouth. Take 4 pills 1 hour prior to dentist appointment   ASPIRIN 325 MG TABLET    Take 325 mg by mouth daily.   ATORVASTATIN (LIPITOR) 20 MG TABLET    TAKE 1 TABLET BY MOUTH EVERY NIGHT AT BEDTIME   CALCIUM CARBONATE 1250 MG CAPSULE    Take 1,250 mg by mouth daily.    CARBIDOPA-LEVODOPA (SINEMET CR) 50-200 MG PER TABLET    Take 1 tablet by mouth at bedtime.   CARBIDOPA-LEVODOPA (SINEMET IR) 25-250 MG PER TABLET    Take 1 tablet by mouth 3 (three) times daily.   CHOLECALCIFEROL (VITAMIN D) 1000 UNITS TABLET    Take 1,000 Units by mouth  daily.   FLUOXETINE (PROZAC) 40 MG CAPSULE    TAKE 1 CAPSULE BY MOUTH EVERY NIGHT AT BEDTIME   GABAPENTIN (NEURONTIN) 800 MG TABLET  Take 1 tablet (800 mg total) by mouth 4 (four) times daily.   HYDROCHLOROTHIAZIDE (HYDRODIURIL) 25 MG TABLET    TAKE 1 TABLET BY MOUTH EVERY MORNING   HYDROCODONE-ACETAMINOPHEN (NORCO/VICODIN) 5-325 MG PER TABLET    Take 1 tablet by mouth every 6 (six) hours as needed (MUST LAST 28 DAYS). Pt referred to pain management   MIRTAZAPINE (REMERON) 15 MG TABLET    TAKE 1 TABLET BY MOUTH EVERY NIGHT AT BEDTIME   PYRIDOXINE (VITAMIN B-6) 50 MG TABLET    Take 50 mg by mouth daily.   SUMATRIPTAN (IMITREX) 50 MG TABLET    Take 1 tablet (50 mg total) by mouth every 2 (two) hours as needed for migraine or headache. May repeat in 2 hours if headache persists or recurs.  Modified Medications   No medications on file  Discontinued Medications   No medications on file     Physical Exam: Filed Vitals:   01/11/14 1336  BP: 122/74  Pulse: 100  Temp: 98.5 F (36.9 C)  TempSrc: Oral  Resp: 10  Height: 5\' 4"  (1.626 m)  Weight: 237 lb (107.502 kg)  SpO2: 96%  Physical Exam  Constitutional: [name] oriented to person, place, and time. She appears well-developed and well-nourished. No distress.  Obese white female  Cardiovascular: Normal rate, regular rhythm, normal heart sounds and intact distal pulses.   Pulmonary/Chest: Effort normal and breath sounds normal. No respiratory distress.  Abdominal: Soft. Bowel sounds are normal. She exhibits no distension and no mass. There is no tenderness.  Musculoskeletal: Normal range of motion. She exhibits tenderness.  Left knee  Neurological: [name] alert and oriented to person, place, and time.  Skin: Skin is warm and dry.  Psychiatric: She has a normal mood and affect.    Labs reviewed:  Lab Results  Component Value Date   HGBA1C 5.3 10/12/2011   Assessment/Plan 1. Candidiasis of breast - will treat with nystatin  powder - nystatin (MYCOSTATIN/NYSTOP) 100000 UNIT/GM POWD; Apply beneath left breast for yeast rash  Dispense: 15 g; Refill: 1  2. Essential hypertension -bp at goal today, pulse high here at office, but regular - EKG 12-Lead was sinus rhythm w/o acute changes  3. 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 Insomnia -cont prozac and remeron to help with this--discussed that i can cause weight gain  5. Severe obesity (BMI >= 40) -encouraged healthy balanced diet and exercise  6. Other dysphagia -unclear cause, monitor for changes  7. Left elbow fracture, sequela -some chronic pain from this, cont gabapentin and norco  8. H/O mammogram -referred for screening mammo   9. Other and unspecified hyperlipidemia -f/u lipids--cont lipitor  - CMP - Lipid Panel - CBC with Differential  Labs/tests ordered: Orders Placed This Encounter  Procedures  . EKG 12-Lead    Order Specific Question:  Where should this test be performed    Answer:  OTHER  COLOGUARD info given   Next appt:  3 mos

## 2014-01-12 LAB — LIPID PANEL
Chol/HDL Ratio: 5 ratio units — ABNORMAL HIGH (ref 0.0–4.4)
Cholesterol, Total: 199 mg/dL (ref 100–199)
HDL: 40 mg/dL (ref >39)
Triglycerides: 462 mg/dL — ABNORMAL HIGH (ref 0–149)

## 2014-01-12 LAB — COMPREHENSIVE METABOLIC PANEL
ALT: <5 IU/L (ref 0–32)
AST: 10 IU/L (ref 0–40)
Albumin/Globulin Ratio: 1.5 (ref 1.1–2.5)
Albumin: 4.2 g/dL (ref 3.5–4.8)
Alkaline Phosphatase: 117 IU/L (ref 39–117)
BUN/Creatinine Ratio: 19 (ref 11–26)
BUN: 19 mg/dL (ref 8–27)
CO2: 24 mmol/L (ref 18–29)
Calcium: 10.1 mg/dL (ref 8.7–10.3)
Chloride: 89 mmol/L — ABNORMAL LOW (ref 97–108)
Creatinine, Ser: 1 mg/dL (ref 0.57–1.00)
GFR calc Af Amer: 65 mL/min/{1.73_m2} (ref >59)
GFR calc non Af Amer: 57 mL/min/{1.73_m2} — ABNORMAL LOW (ref >59)
Globulin, Total: 2.8 g/dL (ref 1.5–4.5)
Glucose: 149 mg/dL — ABNORMAL HIGH (ref 65–99)
Potassium: 3.6 mmol/L (ref 3.5–5.2)
Sodium: 140 mmol/L (ref 134–144)
Total Bilirubin: 0.2 mg/dL (ref 0.0–1.2)
Total Protein: 7 g/dL (ref 6.0–8.5)

## 2014-01-12 LAB — CBC WITH DIFFERENTIAL/PLATELET
Basophils Absolute: 0 10*3/uL (ref 0.0–0.2)
Basos: 0 %
Eos: 1 %
Eosinophils Absolute: 0.2 10*3/uL (ref 0.0–0.4)
HCT: 38.2 % (ref 34.0–46.6)
Hemoglobin: 12.7 g/dL (ref 11.1–15.9)
Immature Grans (Abs): 0 10*3/uL (ref 0.0–0.1)
Immature Granulocytes: 0 %
Lymphocytes Absolute: 2.6 10*3/uL (ref 0.7–3.1)
Lymphs: 21 %
MCH: 31 pg (ref 26.6–33.0)
MCHC: 33.2 g/dL (ref 31.5–35.7)
MCV: 93 fL (ref 79–97)
Monocytes Absolute: 0.8 10*3/uL (ref 0.1–0.9)
Monocytes: 6 %
Neutrophils Absolute: 8.8 10*3/uL — ABNORMAL HIGH (ref 1.4–7.0)
Neutrophils Relative %: 72 %
RBC: 4.1 x10E6/uL (ref 3.77–5.28)
RDW: 13.8 % (ref 12.3–15.4)
WBC: 12.4 10*3/uL — ABNORMAL HIGH (ref 3.4–10.8)

## 2014-01-13 NOTE — Progress Notes (Signed)
Quick Note:  Her sugar is elevated. I would like her to come back in for an hba1c. Her triglycerides are even higher than last time. I need her to really cut back on any fried food, sweets and starchy foods-/bread, rice, potatoes, pasta.   Also, her white blood cell count is elevated suggesting infection. She did not c/o any symptoms. Does she have any urinary symptoms, sinus congestion or anything she didn't mention during the visit? If not, I would repeat her CBC in 1 week (we could do the hba1c at that same time) ______

## 2014-01-14 ENCOUNTER — Other Ambulatory Visit: Payer: Self-pay

## 2014-01-14 ENCOUNTER — Other Ambulatory Visit: Payer: Self-pay | Admitting: *Deleted

## 2014-01-14 DIAGNOSIS — R7309 Other abnormal glucose: Secondary | ICD-10-CM

## 2014-01-14 DIAGNOSIS — D72829 Elevated white blood cell count, unspecified: Secondary | ICD-10-CM

## 2014-01-15 ENCOUNTER — Ambulatory Visit: Payer: Medicare Other

## 2014-01-20 ENCOUNTER — Other Ambulatory Visit: Payer: Medicare Other

## 2014-01-21 ENCOUNTER — Other Ambulatory Visit: Payer: Self-pay | Admitting: Internal Medicine

## 2014-01-21 DIAGNOSIS — Z1231 Encounter for screening mammogram for malignant neoplasm of breast: Secondary | ICD-10-CM

## 2014-01-24 ENCOUNTER — Other Ambulatory Visit: Payer: Self-pay | Admitting: Nurse Practitioner

## 2014-01-29 ENCOUNTER — Ambulatory Visit: Payer: Medicare Other

## 2014-01-29 ENCOUNTER — Other Ambulatory Visit: Payer: Medicare Other

## 2014-02-01 ENCOUNTER — Other Ambulatory Visit: Payer: Self-pay | Admitting: Internal Medicine

## 2014-02-01 ENCOUNTER — Other Ambulatory Visit: Payer: Self-pay | Admitting: Neurology

## 2014-02-01 MED ORDER — HYDROCODONE-ACETAMINOPHEN 5-325 MG PO TABS: 1.0000 | ORAL_TABLET | Freq: Four times a day (QID) | ORAL | Status: DC | PRN

## 2014-02-01 NOTE — Telephone Encounter (Signed)
Patient requesting refill of hydrocodone script to pick up, please return call to patient and advise.

## 2014-02-02 ENCOUNTER — Other Ambulatory Visit: Payer: Self-pay | Admitting: Internal Medicine

## 2014-02-02 NOTE — Telephone Encounter (Signed)
Called pt and left message informing her that her Rx was ready to be picked up at the front desk and if she has any other problems, questions or concerns to call the office.  °

## 2014-02-03 ENCOUNTER — Other Ambulatory Visit: Payer: Self-pay | Admitting: Internal Medicine

## 2014-03-01 ENCOUNTER — Other Ambulatory Visit: Payer: Self-pay | Admitting: *Deleted

## 2014-03-01 MED ORDER — HYDROCODONE-ACETAMINOPHEN 5-325 MG PO TABS: 1.0000 | ORAL_TABLET | Freq: Four times a day (QID) | ORAL | Status: DC | PRN

## 2014-03-01 NOTE — Telephone Encounter (Signed)
Pt calling and asking for refill of hydrocodone.

## 2014-03-01 NOTE — Telephone Encounter (Signed)
Dr Willis is out of the office, forwarding request to [facility] for review.   

## 2014-03-02 ENCOUNTER — Telehealth: Payer: Self-pay | Admitting: Neurology

## 2014-03-02 NOTE — Telephone Encounter (Signed)
(  system froze while I was typing message) Called back, got no answer.  Left message saying we would call her as soon as the Rx is signed and ready for pick up

## 2014-03-02 NOTE — Telephone Encounter (Signed)
Note was created in ERROR, please disregard.

## 2014-03-02 NOTE — Telephone Encounter (Signed)
Called pt to inform her that her Rx was ready to be picked up at the front desk and if she has any other problems, questions to call the office. Pt verbalized understanding.

## 2014-03-02 NOTE — Telephone Encounter (Deleted)
Called pt to inform her that her Rx was ready to be picked up at the front desk and if she has any other problems, questions or concerns to call the office. Pt verbalized understanding. °

## 2014-03-02 NOTE — Telephone Encounter (Signed)
Patient already called for this Rx yesterday.  It is pending provider signature.  I called the patient back

## 2014-03-02 NOTE — Telephone Encounter (Signed)
Patient requesting refill of hydrocodone, please call when ready for pick up.  °

## 2014-03-03 ENCOUNTER — Other Ambulatory Visit: Payer: Self-pay | Admitting: Internal Medicine

## 2014-03-15 ENCOUNTER — Other Ambulatory Visit: Payer: Self-pay | Admitting: *Deleted

## 2014-03-15 MED ORDER — HYDROCHLOROTHIAZIDE 25 MG PO TABS: ORAL_TABLET | ORAL | Status: DC

## 2014-03-15 MED ORDER — FLUOXETINE HCL 40 MG PO CAPS: ORAL_CAPSULE | ORAL | Status: DC

## 2014-03-15 MED ORDER — SUMATRIPTAN SUCCINATE 50 MG PO TABS: ORAL_TABLET | ORAL | Status: DC

## 2014-03-15 MED ORDER — AMLODIPINE BESYLATE 10 MG PO TABS: ORAL_TABLET | ORAL | Status: DC

## 2014-03-15 MED ORDER — CARBIDOPA-LEVODOPA ER 50-200 MG PO TBCR: 1.0000 | EXTENDED_RELEASE_TABLET | Freq: Every day | ORAL | Status: DC

## 2014-03-15 MED ORDER — ATORVASTATIN CALCIUM 20 MG PO TABS: ORAL_TABLET | ORAL | Status: DC

## 2014-03-15 MED ORDER — MIRTAZAPINE 15 MG PO TABS: ORAL_TABLET | ORAL | Status: DC

## 2014-03-15 MED ORDER — CARBIDOPA-LEVODOPA 25-250 MG PO TABS: ORAL_TABLET | ORAL | Status: DC

## 2014-03-15 NOTE — Telephone Encounter (Signed)
[facility] 

## 2014-03-17 ENCOUNTER — Telehealth: Payer: Self-pay | Admitting: *Deleted

## 2014-03-17 NOTE — Telephone Encounter (Signed)
Patient called and stated that [facility] had some questions and we needed to call them before they would mail out patient's Rx's.  Call # [phone] Patient ID # [id number]. I called and spoke with Horcey at [facility] and she asked who was [name] and if she was a NP. She needed a Dr. Rober Minion is under. Gave her Dr. Rolly Salter name.

## 2014-03-18 ENCOUNTER — Other Ambulatory Visit: Payer: Self-pay | Admitting: Neurology

## 2014-03-20 ENCOUNTER — Other Ambulatory Visit: Payer: Self-pay | Admitting: Nurse Practitioner

## 2014-03-29 ENCOUNTER — Other Ambulatory Visit: Payer: Self-pay | Admitting: Neurology

## 2014-03-29 MED ORDER — HYDROCODONE-ACETAMINOPHEN 5-325 MG PO TABS: 1.0000 | ORAL_TABLET | Freq: Four times a day (QID) | ORAL | Status: DC | PRN

## 2014-03-29 NOTE — Telephone Encounter (Signed)
Dr Jannifer Franklin is out of the office.  Request entered, forwarded to [facility] for review. Last written 09/28.

## 2014-03-29 NOTE — Telephone Encounter (Signed)
Patient is calling for a written Rx for Hydrocodone. Please call patient when ready for pick up.  Thank you.

## 2014-03-30 NOTE — Telephone Encounter (Signed)
I called the patient back.  [name] aware we will call her when Rx is ready for pick up.

## 2014-03-30 NOTE — Telephone Encounter (Signed)
Patient calling to check on the status of her refill request for Hydrocodone, please return call and advise.

## 2014-04-01 ENCOUNTER — Telehealth: Payer: Self-pay | Admitting: Neurology

## 2014-04-01 ENCOUNTER — Other Ambulatory Visit: Payer: Self-pay

## 2014-04-01 MED ORDER — HYDROCODONE-ACETAMINOPHEN 5-325 MG PO TABS: 1.0000 | ORAL_TABLET | Freq: Four times a day (QID) | ORAL | Status: DC | PRN

## 2014-04-01 NOTE — Telephone Encounter (Signed)
Patient calling back, very upset that it's taken so long to get her medication.  Patient states she's in severe pain and this matter needs to be taken care as soon as possible.

## 2014-04-01 NOTE — Telephone Encounter (Signed)
Rx previously auth by [facility] on Monday, however, Rx did not print.  Thank you.

## 2014-04-02 NOTE — Telephone Encounter (Signed)
I called the patient to let them know their Rx for Hydrocodone was ready for pickup. Patient was instructed to bring Photo ID. 

## 2014-04-08 ENCOUNTER — Ambulatory Visit: Payer: Medicare Other | Admitting: Neurology

## 2014-04-21 ENCOUNTER — Encounter: Payer: Self-pay | Admitting: Neurology

## 2014-04-23 ENCOUNTER — Telehealth: Payer: Self-pay

## 2014-04-23 NOTE — Telephone Encounter (Signed)
Fax received indicating patient was shipped kit on 02/23/2014, kit was never returned.  I called patient per Dr.[name]'s request and left detailed message requesting that she complete test.

## 2014-04-27 ENCOUNTER — Encounter: Payer: Self-pay | Admitting: Neurology

## 2014-04-27 ENCOUNTER — Telehealth: Payer: Self-pay | Admitting: *Deleted

## 2014-04-27 MED ORDER — HYDROCODONE-ACETAMINOPHEN 5-325 MG PO TABS: 1.0000 | ORAL_TABLET | Freq: Four times a day (QID) | ORAL | Status: DC | PRN

## 2014-04-27 NOTE — Addendum Note (Signed)
Addended by: Margette Fast on: 04/27/2014 04:44 PM   Modules accepted: Orders

## 2014-04-27 NOTE — Telephone Encounter (Signed)
Dr Jannifer Franklin,  This patient is requesting a Vicodin refill.

## 2014-04-27 NOTE — Telephone Encounter (Signed)
Since I have not actually seen this patient yet I can't refill the vicodin. It will have to be sent to the MD that saw her. Thanks.

## 2014-04-27 NOTE — Telephone Encounter (Signed)
The patient has a revisit through our office on 05/07/2014, I will give a refill for hydrocodone at this time.

## 2014-04-27 NOTE — Telephone Encounter (Signed)
I called the patient to let them know their Rx for Hydrocodone was ready for pickup. Patient was instructed to bring Photo ID. 

## 2014-04-27 NOTE — Telephone Encounter (Signed)
[name],  This is one that I called to reschedule from 13- 4 -15 slot.  She said she couldn't come in tomorrow, but would like a refill on her Vicodin.  Looks like she has been getting fills from here since 2014 and has been to only one office visit.  All the rest were scheduled and cancelled.

## 2014-04-28 NOTE — Telephone Encounter (Signed)
I called patient. The hydrocodone prescription is due in 2 days, the patient will be traveling, she wants an early refill, I see no problem with this. I called the pharmacy, okay the early refill.

## 2014-04-28 NOTE — Telephone Encounter (Signed)
[name] with [facility] @ [phone], stated patient came to pick up Rx HYDROcodone-acetaminophen (NORCO/VICODIN) 5-325 MG per tablet.  Medication is not due for refill until 11/27.  Patient at pharmacy, very demanding and wanting Rx refilled today.  Please call and advise.

## 2014-05-06 ENCOUNTER — Ambulatory Visit: Payer: Medicare Other | Admitting: Internal Medicine

## 2014-05-07 ENCOUNTER — Ambulatory Visit: Payer: Medicare Other | Admitting: Adult Health

## 2014-05-12 ENCOUNTER — Telehealth: Payer: Self-pay | Admitting: Neurology

## 2014-05-12 NOTE — Telephone Encounter (Signed)
[name] @ [facility] is calling to state that she got a referral for patient about a year ago, and wants to know if the patient is going to pain management or is she still interested in coming there for pain management.  Please call and advise.

## 2014-05-12 NOTE — Telephone Encounter (Signed)
Left message for patient to see if she is still interested in pain management referral.  I asked her to call [facility] to speak to them if she still wants an appointment.  I also left message for [name] at [facility] to call the patient to see if she wants to be seen.

## 2014-05-15 ENCOUNTER — Other Ambulatory Visit: Payer: Self-pay | Admitting: Neurology

## 2014-05-17 ENCOUNTER — Ambulatory Visit: Payer: Self-pay | Admitting: Adult Health

## 2014-05-20 ENCOUNTER — Ambulatory Visit (INDEPENDENT_AMBULATORY_CARE_PROVIDER_SITE_OTHER): Payer: Medicare Other | Admitting: Adult Health

## 2014-05-20 ENCOUNTER — Encounter: Payer: Self-pay | Admitting: Adult Health

## 2014-05-20 VITALS — BP 138/73 | HR 105 | Temp 97.7°F | Ht 64.0 in | Wt 228.0 lb

## 2014-05-20 DIAGNOSIS — G63 Polyneuropathy in diseases classified elsewhere: Secondary | ICD-10-CM

## 2014-05-20 DIAGNOSIS — G2581 Restless legs syndrome: Secondary | ICD-10-CM

## 2014-05-20 NOTE — Progress Notes (Signed)
I have read the note, and I agree with the clinical assessment and plan.  WILLIS,CHARLES KEITH   

## 2014-05-20 NOTE — Patient Instructions (Signed)
Continue taking Vicodin, gabapentin and Sinemet. No refills needed at this time.  Please follow-up with your Pain management specialist.  If your symptoms worsen or you develop new symptoms please let us know.

## 2014-05-20 NOTE — Progress Notes (Signed)
PATIENT: [name] DOB: [birth year]  REASON FOR VISIT: follow up HISTORY FROM: patient  HISTORY OF PRESENT ILLNESS: Mrs. Carrasco is a 73 year old female with a history of peripheral neuropathy and restless legs. She returns today for follow-up. [name] currently taking Vicodin for pain as well as gabapentin. She also uses Sinemet for RLS. She report this is working well. Patient reports that [facility] and states that she was not impressed. She does have a follow up appointment 1/7. She recently bought a TENS unit and using on the right foot. She states this is working well for her. She reports that she has numbness and tingling in both feet. Patient states that her balance has gotten a little worse. She uses a cane more often. Denies any falls.   HISTORY 04/08/13 (CM): 73 year old right-handed white female with a history of a peripheral neuropathy, obesity, and degenerative arthritis. 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 [name] still having pain in the knee. She also has back pain followed by Dr. Ellene Route. [name] not using any assistive device. The patient has been taking Vicodin for pain. The patient has restless leg syndrome, and takes Sinemet for this with good benefit. The patient indicates that she is able to rest at night fairly well.She also takes neurontin for her neuropathy. Symptoms are fairly well controlled. She has been having problems with left hand,recent surgery on left thumb and CT release on the left  REVIEW OF SYSTEMS: Out of a complete 14 system review of symptoms, the patient complains only of the following symptoms, and all other reviewed systems are negative.  Restless leg Joint pain, joint swelling, back pain, aching muscles, walking difficulty Numbness, weakness Depression   ALLERGIES: Allergies  Allergen Reactions  . Sulfa Drugs Cross Reactors Nausea And Vomiting    HOME MEDICATIONS: Outpatient Prescriptions  Prior to Visit  Medication Sig Dispense Refill  . acetaminophen (TYLENOL) 500 MG tablet Take 500 mg by mouth every 6 (six) hours as needed. Pain    . amLODipine (NORVASC) 10 MG tablet Take one tablet by mouth every night at bedtime 90 tablet 0  . amoxicillin (AMOXIL) 500 MG capsule Take 500 mg by mouth. Take 4 pills 1 hour prior to dentist appointment    . aspirin 325 MG tablet Take 325 mg by mouth daily.    Marland Kitchen atorvastatin (LIPITOR) 20 MG tablet Take one tablet by mouth at bedtime 90 tablet 0  . calcium carbonate 1250 MG capsule Take 1,250 mg by mouth daily.     . carbidopa-levodopa (SINEMET CR) 50-200 MG per tablet TAKE 1 TABLET BY MOUTH EVERY NIGHT AT BEDTIME 90 tablet 0  . carbidopa-levodopa (SINEMET IR) 25-250 MG per tablet Take one tablet by mouth three times daily 270 tablet 0  . cholecalciferol (VITAMIN D) 1000 UNITS tablet Take 1,000 Units by mouth daily.    Marland Kitchen FLUoxetine (PROZAC) 40 MG capsule Take one capsule by mouth every night at bedtime 90 capsule 0  . gabapentin (NEURONTIN) 800 MG tablet Take 1 tablet by mouth 4  times daily 360 tablet 0  . hydrochlorothiazide (HYDRODIURIL) 25 MG tablet Take one tablet by mouth every morning 90 tablet 0  . HYDROcodone-acetaminophen (NORCO/VICODIN) 5-325 MG per tablet Take 1 tablet by mouth every 6 (six) hours as needed (MUST LAST 28 DAYS). 90 tablet 0  . mirtazapine (REMERON) 15 MG tablet Take one tablet by mouth every night at bedtime 90 tablet 0  .  nystatin (MYCOSTATIN/NYSTOP) 100000 UNIT/GM POWD Apply beneath left breast for yeast rash 15 g 1  . pyridOXINE (VITAMIN B-6) 50 MG tablet Take 50 mg by mouth daily.    . SUMAtriptan (IMITREX) 50 MG tablet Take one tablet by mouth every 2 hours as needed for migraine or headache. May repeat in 2 hours if headache persists or recurs. 30 tablet 0   No facility-administered medications prior to visit.    PAST MEDICAL HISTORY: Past Medical History  Diagnosis Date  . Hypertension   . GERD  (gastroesophageal reflux disease)     hx pud '98  . Arthritis     djd  . Restless leg syndrome   . 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 Dysrhythmia     hx tachy palpitations  . Headache(784.0)     remote hx of migraines  . Neuromuscular disorder     peripheral neuropathy, FEET AND LEGS  . Depression     bh admission '09  . Stroke     ? 6 months ago - tests okay - [facility]   . Cellulitis     10/11/11 hospitalized for cellulitis   . Hyperlipemia   . Peripheral neuropathy   . Elbow fracture, left April 2015    PAST SURGICAL HISTORY: Past Surgical History  Procedure Laterality Date  . Cervical disc surgery x2  2011  . Rt carotid enarterectomy  2011  . Rt knee arthroscopy  '99  . Left knee arthroscopy  2001  . Hematoma evacuation  2011    s/p rt cea  . Joint replacement  '01    total knee replacement, LEFT  . Abdominal hysterectomy  1975    bil oophorectomy  . Back surgery   MANY YRS AGO    laminectomy x3  . Cholecystectomy  1993  . Total knee arthroplasty  09/13/2011    Procedure: TOTAL KNEE ARTHROPLASTY;  Surgeon: Magnus Sinning, MD;  Location: [facility];  Service: Orthopedics;  Laterality: Right;  . Right knee replacement      09/2011   . Carpal tunnel release  07/09/2012    Procedure: CARPAL TUNNEL RELEASE;  Surgeon: Magnus Sinning, MD;  Location: [facility];  Service: Orthopedics;  Laterality: Left;  . Finger arthroplasty  07/09/2012    Procedure: FINGER ARTHROPLASTY;  Surgeon: Magnus Sinning, MD;  Location: [facility];  Service: Orthopedics;  Laterality: Left;  Interposition Arthroplasty CMC Joint Thumb Left   . Lipoma excision  07/09/2012    Procedure: EXCISION LIPOMA;  Surgeon: Magnus Sinning, MD;  Location: [facility];  Service: Orthopedics;  Laterality: Left;  Excision Lipoma Dorsum Left Wrist     FAMILY HISTORY: Family History  Problem Relation Age of Onset  . Congestive Heart Failure Father   . Congestive Heart Failure Sister   . Alzheimer's disease  Mother   . Diabetes Brother   . Arthritis Brother     SOCIAL HISTORY: History   Social History  . Marital Status: Widowed    Spouse Name: N/A    Number of Children: 2  . Years of Education: 14   Occupational History  . Not on file.   Social History Main Topics  . Smoking status: Never Smoker   . Smokeless tobacco: Never Used  . Alcohol Use: No  . Drug Use: No  . Sexual Activity: Not on file   Other Topics Concern  . Not on file   Social History Narrative  Patient is widowed and lives alone.   Patient has two sons.   Patient is retired.   Patient has a college education.   Patient is right-handed.   Patient drinks three glasses of Coke-soda daily.            PHYSICAL EXAM  Filed Vitals:   05/20/14 0832  BP: 138/73  Pulse: 105  Temp: 97.7 F (36.5 C)  TempSrc: Oral  Height: 5\' 4"  (1.626 m)  Weight: 228 lb (103.42 kg)   Body mass index is 39.12 kg/(m^2).  Generalized: Well developed, in no acute distress   Neurological examination  Mentation: Alert oriented to time, place, history taking. Follows all commands speech and language fluent Cranial nerve II-XII: Pupils were equal round reactive to light. Extraocular movements were full, visual field were full on confrontational test. Facial sensation and strength were normal. Uvula tongue midline. Head turning and shoulder shrug  were normal and symmetric. Motor: The motor testing reveals 5 over 5 strength of all 4 extremities. Good symmetric motor tone is noted throughout.  Sensory: Sensory testing is intact to soft touch on all 4 extremities. No evidence of extinction is noted.  Coordination: Cerebellar testing reveals good finger-nose-finger and heel-to-shin bilaterally.  Gait and station: Gait is normal. Tandem gait is unsteady. Romberg is negative. No drift is seen.  Reflexes: Deep tendon reflexes are symmetric and normal bilaterally.    DIAGNOSTIC DATA (LABS, IMAGING, TESTING) - I reviewed patient  records, labs, notes, testing and imaging myself where available.  Lab Results  Component Value Date   WBC 12.4* 01/11/2014   HGB 12.7 01/11/2014   HCT 38.2 01/11/2014   MCV 93 01/11/2014   PLT  07/09/2012    PLATELET CLUMPS NOTED ON SMEAR, COUNT APPEARS ADEQUATE      Component Value Date/Time   NA 140 01/11/2014 1514   NA 133* 07/09/2012 1250   K 3.6 01/11/2014 1514   CL 89* 01/11/2014 1514   CO2 24 01/11/2014 1514   GLUCOSE 149* 01/11/2014 1514   GLUCOSE 155* 07/09/2012 1250   BUN 19 01/11/2014 1514   BUN 22 07/09/2012 1250   CREATININE 1.00 01/11/2014 1514   CALCIUM 10.1 01/11/2014 1514   PROT 7.0 01/11/2014 1514   PROT 7.4 10/11/2011 1605   ALBUMIN 3.8 10/11/2011 1605   AST 10 01/11/2014 1514   ALT <5 01/11/2014 1514   ALKPHOS 117 01/11/2014 1514   BILITOT 0.2 01/11/2014 1514   GFRNONAA 57* 01/11/2014 1514   GFRAA 65 01/11/2014 1514   Lab Results  Component Value Date   CHOL 240* 10/12/2011   HDL 40 01/11/2014   LDLCALC Comment 01/11/2014   TRIG 462* 01/11/2014   CHOLHDL 5.0* 01/11/2014   Lab Results  Component Value Date   HGBA1C 5.3 10/12/2011   No results found for: VITAMINB12 Lab Results  Component Value Date   TSH 2.909 10/13/2011      ASSESSMENT AND PLAN 73 y.o. year old female  has a past medical history of Hypertension; GERD (gastroesophageal reflux disease); Arthritis; Restless leg syndrome; PPD positive, treated (1987); Dysrhythmia; Headache(784.0); Neuromuscular disorder; Depression; Stroke; Cellulitis; Hyperlipemia; Peripheral neuropathy; and Elbow fracture, left (April 2015). here with:  1. Peripheral neuropathy 2. Restless leg syndrome  Overall the patient has remained stable. She will continue taking Vicodin and gabapentin for neuropathic pain. [name] currently taking Sinemet for restless legs. She states this is working well. She states that Dr. Cyndi Lennert office will prescribe this medication. The patient should continue to  follow up  with the pain management clinic. If her symptoms worsen or she develops any new symptoms she will let us know. Otherwise she'll follow up in 6 months   Ward Givens, MSN, NP-C 05/20/2014, 8:51 AM [facility] [address] [place],  [address] [phone]  Note: This document was prepared with digital dictation and possible smart phrase technology. Any transcriptional errors that result from this process are unintentional.

## 2014-05-24 ENCOUNTER — Encounter: Payer: Self-pay | Admitting: Internal Medicine

## 2014-05-24 ENCOUNTER — Ambulatory Visit (INDEPENDENT_AMBULATORY_CARE_PROVIDER_SITE_OTHER): Payer: Medicare Other | Admitting: Internal Medicine

## 2014-05-24 VITALS — BP 130/82 | HR 98 | Temp 99.2°F | Resp 12 | Ht 64.0 in | Wt 227.0 lb

## 2014-05-24 DIAGNOSIS — F329 Major depressive disorder, single episode, unspecified: Secondary | ICD-10-CM

## 2014-05-24 DIAGNOSIS — F32A Depression, unspecified: Secondary | ICD-10-CM

## 2014-05-24 DIAGNOSIS — J309 Allergic rhinitis, unspecified: Secondary | ICD-10-CM

## 2014-05-24 DIAGNOSIS — I10 Essential (primary) hypertension: Secondary | ICD-10-CM

## 2014-05-24 MED ORDER — FLUOXETINE HCL 20 MG PO TABS: 20.0000 mg | ORAL_TABLET | Freq: Every day | ORAL | Status: DC

## 2014-05-24 NOTE — Progress Notes (Signed)
Patient ID: [name], female   DOB: [birth year], 73 y.o.   MRN: [id number]   Location:  [facility]  Allergies  Allergen Reactions  . Sulfa Drugs Cross Reactors Nausea And Vomiting    Chief Complaint  Patient presents with  . Medical Management of Chronic Issues    3 month follow-up, no recent labs   . Ear Problem    Feels like water is in ear x couple weeks, no pain   . Anxiety    Picks at nails x 2 weeks or more, discuss if prozac is working     HPI: Patient is a 73 y.o. white female seen in the office today for med mgt chronic diseases and a couple of active concerns.  Having laryngitis.  Some sinus congestion and some water in her ear.  Has been taking sinutab which was ineffective and some mucinex.    Has been anxious and thinking back to her husband and the slightest thing reminds her of him.  Had normal grief at his death..had a few days where she had trouble getting out of bed.  Has been picking her nails down. Worse two different white socks but didn't care enough to change them.  Lost crown off her front tooth this am.  Takes prozac.  Has been on it maybe 20-25 years.  Wonders if she needs something different. Will be 10 years in 09/05/2022 when husband passed away--did have a busy time of year with Christmas when he was living.  She didn't have these problems last year though.  Does note increased appetite with remeron.    Does her knee and back exercises.  Left knee needs second replacement.  Has been checking bp at home and it is doing well.  HR goes up with anxiety.    Review of Systems:  Review of Systems  HENT: Positive for congestion. Negative for sore throat.        Left ear fullness and "squishiness"  Respiratory: Positive for cough. Negative for sputum production and shortness of breath.   Gastrointestinal: Negative for abdominal pain.  Musculoskeletal: Negative for falls.  Psychiatric/Behavioral: Positive for  depression. The patient is nervous/anxious.     Past Medical History  Diagnosis Date  . Hypertension   . GERD (gastroesophageal reflux disease)     hx pud Sep 04, 1996  . Arthritis     djd  . Restless leg syndrome   . 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 Dysrhythmia     hx tachy palpitations  . Headache(784.0)     remote hx of migraines  . Neuromuscular disorder     peripheral neuropathy, FEET AND LEGS  . Depression     bh admission '09  . Stroke     ? 6 months ago - tests okay - [facility]   . Cellulitis     10/11/11 hospitalized for cellulitis   . Hyperlipemia   . Peripheral neuropathy   . Elbow fracture, left April 2015    Past Surgical History  Procedure Laterality Date  . Cervical disc surgery x2  09/04/2009  . Rt carotid enarterectomy  09-04-2009  . Rt knee arthroscopy  09-04-97  . Left knee arthroscopy  09/05/99  . Hematoma evacuation  September 04, 2009    s/p rt cea  . Joint replacement  '01    total knee replacement, LEFT  . Abdominal hysterectomy  1975    bil oophorectomy  .  Back surgery   MANY YRS AGO    laminectomy x3  . Cholecystectomy  1993  . Total knee arthroplasty  09/13/2011    Procedure: TOTAL KNEE ARTHROPLASTY;  Surgeon: Magnus Sinning, MD;  Location: [facility];  Service: Orthopedics;  Laterality: Right;  . Right knee replacement      09/2011   . Carpal tunnel release  07/09/2012    Procedure: CARPAL TUNNEL RELEASE;  Surgeon: Magnus Sinning, MD;  Location: [facility];  Service: Orthopedics;  Laterality: Left;  . Finger arthroplasty  07/09/2012    Procedure: FINGER ARTHROPLASTY;  Surgeon: Magnus Sinning, MD;  Location: [facility];  Service: Orthopedics;  Laterality: Left;  Interposition Arthroplasty CMC Joint Thumb Left   . Lipoma excision  07/09/2012    Procedure: EXCISION LIPOMA;  Surgeon: Magnus Sinning, MD;  Location: [facility];  Service: Orthopedics;  Laterality: Left;  Excision Lipoma Dorsum Left Wrist     Social History:   reports that she has never smoked. She has never used  smokeless tobacco. She reports that she does not drink alcohol or use illicit drugs.  Family History  Problem Relation Age of Onset  . Congestive Heart Failure Father   . Congestive Heart Failure Sister   . Alzheimer's disease Mother   . Diabetes Brother   . Arthritis Brother     Medications: Patient's Medications  New Prescriptions   No medications on file  Previous Medications   ACETAMINOPHEN (TYLENOL) 500 MG TABLET    Take 500 mg by mouth every 6 (six) hours as needed. Pain   AMLODIPINE (NORVASC) 10 MG TABLET    Take one tablet by mouth every night at bedtime   AMOXICILLIN (AMOXIL) 500 MG CAPSULE    Take 500 mg by mouth. Take 4 pills 1 hour prior to dentist appointment   ASPIRIN 325 MG TABLET    Take 325 mg by mouth daily.   ATORVASTATIN (LIPITOR) 20 MG TABLET    Take one tablet by mouth at bedtime   CALCIUM CARBONATE 1250 MG CAPSULE    Take 1,250 mg by mouth daily.    CARBIDOPA-LEVODOPA (SINEMET CR) 50-200 MG PER TABLET    TAKE 1 TABLET BY MOUTH EVERY NIGHT AT BEDTIME   CARBIDOPA-LEVODOPA (SINEMET IR) 25-250 MG PER TABLET    Take one tablet by mouth three times daily   CHOLECALCIFEROL (VITAMIN D) 1000 UNITS TABLET    Take 1,000 Units by mouth daily.   FLUOXETINE (PROZAC) 40 MG CAPSULE    Take one capsule by mouth every night at bedtime   GABAPENTIN (NEURONTIN) 800 MG TABLET    Take 1 tablet by mouth 4  times daily   HYDROCHLOROTHIAZIDE (HYDRODIURIL) 25 MG TABLET    Take one tablet by mouth every morning   HYDROCODONE-ACETAMINOPHEN (NORCO/VICODIN) 5-325 MG PER TABLET    Take 1 tablet by mouth every 6 (six) hours as needed (MUST LAST 28 DAYS).   MIRTAZAPINE (REMERON) 15 MG TABLET    Take one tablet by mouth every night at bedtime   NYSTATIN (MYCOSTATIN/NYSTOP) 100000 UNIT/GM POWD    Apply beneath left breast for yeast rash   PYRIDOXINE (VITAMIN B-6) 50 MG TABLET    Take 50 mg by mouth daily.   SUMATRIPTAN (IMITREX) 50 MG TABLET    Take one tablet by mouth every 2 hours as needed for  migraine or headache. May repeat in 2 hours if headache persists or recurs.  Modified Medications   No medications on file  Discontinued Medications   No medications on file     Physical Exam: Filed Vitals:   05/24/14 1520  BP: 130/82  Pulse: 98  Temp: 99.2 F (37.3 C)  TempSrc: Oral  Resp: 12  Height: 5\' 4"  (1.626 m)  Weight: 227 lb (102.967 kg)  SpO2: 98%  Physical Exam  Constitutional: She appears well-developed and well-nourished. No distress.  HENT:  Left ear with dull TM, appears to have some fluid behind eardrum;  Has postnasal drip (white)  Cardiovascular: Normal rate, regular rhythm, normal heart sounds and intact distal pulses.   Pulmonary/Chest: Effort normal and breath sounds normal. No respiratory distress.  Abdominal: Soft. Bowel sounds are normal.  Musculoskeletal: Normal range of motion.  Skin: Skin is warm and dry.  Has picked her nails way down below the tips     Labs reviewed: Basic Metabolic Panel:  Recent Labs  01/11/14 1514  NA 140  K 3.6  CL 89*  CO2 24  GLUCOSE 149*  BUN 19  CREATININE 1.00  CALCIUM 10.1   Liver Function Tests:  Recent Labs  01/11/14 1514  AST 10  ALT <5  ALKPHOS 117  BILITOT 0.2  PROT 7.0   No results for input(s): LIPASE, AMYLASE in the last 8760 hours. No results for input(s): AMMONIA in the last 8760 hours. CBC:  Recent Labs  01/11/14 1514  WBC 12.4*  NEUTROABS 8.8*  HGB 12.7  HCT 38.2  MCV 93   Lipid Panel:  Recent Labs  01/11/14 1514  HDL 40  LDLCALC Comment  TRIG 462*  CHOLHDL 5.0*   Lab Results  Component Value Date   HGBA1C 5.3 10/12/2011   Assessment/Plan 1. Depression -prozac 40mg  is not effective anymore and anxiety is also through the roof -will taper down prozac to 20mg  for one week - FLUoxetine (PROZAC) 20 MG tablet; Take 1 tablet (20 mg total) by mouth daily.  Dispense: 7 tablet; Refill: 0 -then start brintellix 5mg  daily for 1 wk, Then brintellix 10mg  daily for 5  wks F/u in 6 wks to see how mood is doing Call sooner if not doing better Trying to avoid benzos  2. Essential hypertension -bp at goal with current therapy  3. Severe obesity (BMI >= 40) -encouraged stationary bike or swimming for exercise--cannot walk too much due to left knee pains  4. Allergic sinusitis - advised to try nettipot and, if ineffective, try otc allergy medication like zyrtec  Labs/tests ordered:  No orders of the defined types were placed in this encounter.   Next appt:  3 mos  Mariha Sleeper L. Tiyanna Larcom, D.O. [facility] [address], [place] [address] Cell Phone (Mon-Fri 8am-5pm):  [phone] On Call:  [phone] & follow prompts after 5pm & weekends Office Phone:  [phone] Office Fax:  [phone]

## 2014-05-24 NOTE — Patient Instructions (Addendum)
Try flushing your sinuses with a nettipot (supplied).  You can buy the salt packets at [facility].  Try an allergy tablet over the counter like zyrtec for a week or two to see if it will help dry up your secretions if the nettipot is inadequate.  Decrease your prozac to 20mg  daily (prescription sent to [facility]) for one week. Then stop prozac and begin brintellix 5mg  daily for one week.     Then increase to brintellix 10mg  daily for 5 weeks.

## 2014-05-25 ENCOUNTER — Other Ambulatory Visit: Payer: Self-pay | Admitting: Neurology

## 2014-05-25 MED ORDER — HYDROCODONE-ACETAMINOPHEN 5-325 MG PO TABS: 1.0000 | ORAL_TABLET | Freq: Four times a day (QID) | ORAL | Status: DC | PRN

## 2014-05-25 NOTE — Telephone Encounter (Signed)
Patient requesting Rx refill for HYDROcodone-acetaminophen (NORCO/VICODIN) 5-325 MG per tablet.  Please call when ready for pick up.

## 2014-05-25 NOTE — Telephone Encounter (Signed)
Dr Jannifer Franklin is out of the office.  Request entered, forwarded to [facility] for approval.

## 2014-05-26 ENCOUNTER — Encounter: Payer: Self-pay | Admitting: *Deleted

## 2014-05-26 ENCOUNTER — Telehealth: Payer: Self-pay | Admitting: *Deleted

## 2014-05-26 DIAGNOSIS — H8110 Benign paroxysmal vertigo, unspecified ear: Secondary | ICD-10-CM

## 2014-05-26 MED ORDER — MECLIZINE HCL 25 MG PO TABS: ORAL_TABLET | ORAL | Status: DC

## 2014-05-26 NOTE — Telephone Encounter (Signed)
Patient Notified and agreed. Faxed Rx into pharmacy and placed order for PT

## 2014-05-26 NOTE — Telephone Encounter (Signed)
Patient called and stated that she just saw you and you stated that she had fluid on her ears and now she is very dizzy and it is making her nauseated. Patient cannot come in because she cant drive because she is so dizzy. Please Advise.

## 2014-05-26 NOTE — Telephone Encounter (Signed)
It sounds like she has now developed vertigo.  Ideally, she should receive physical therapy for this, but it probably would not start for several days b/c of Christmas.  Let's put in the order anyway for PT--Epley maneuver for vertigo.  Also, meclizine 25mg  daily as needed for vertigo.

## 2014-06-01 ENCOUNTER — Encounter: Payer: Self-pay | Admitting: Internal Medicine

## 2014-06-04 ENCOUNTER — Other Ambulatory Visit: Payer: Self-pay | Admitting: Nurse Practitioner

## 2014-06-10 ENCOUNTER — Other Ambulatory Visit: Payer: Self-pay | Admitting: *Deleted

## 2014-06-10 MED ORDER — MIRTAZAPINE 15 MG PO TABS: ORAL_TABLET | ORAL | Status: DC

## 2014-06-10 MED ORDER — CARBIDOPA-LEVODOPA ER 50-200 MG PO TBCR: 1.0000 | EXTENDED_RELEASE_TABLET | Freq: Every day | ORAL | Status: DC

## 2014-06-10 NOTE — Telephone Encounter (Signed)
[facility] 

## 2014-06-20 ENCOUNTER — Other Ambulatory Visit: Payer: Self-pay | Admitting: Nurse Practitioner

## 2014-06-21 ENCOUNTER — Telehealth: Payer: Self-pay | Admitting: *Deleted

## 2014-06-21 MED ORDER — HYDROCODONE-ACETAMINOPHEN 5-325 MG PO TABS: 1.0000 | ORAL_TABLET | Freq: Four times a day (QID) | ORAL | Status: DC | PRN

## 2014-06-21 NOTE — Telephone Encounter (Signed)
Patient calling for refill of Hydrocodone, would like a call when prescription is ready for pick up.

## 2014-06-21 NOTE — Telephone Encounter (Signed)
The refill is due tomorrow, I will write the prescription.

## 2014-06-22 ENCOUNTER — Telehealth: Payer: Self-pay

## 2014-06-22 NOTE — Telephone Encounter (Signed)
Called patient to inform Rx ready for pick up at front desk. No answer.  

## 2014-07-05 ENCOUNTER — Ambulatory Visit: Payer: Medicare Other | Admitting: Internal Medicine

## 2014-07-13 ENCOUNTER — Ambulatory Visit: Payer: Self-pay | Admitting: Internal Medicine

## 2014-07-20 ENCOUNTER — Other Ambulatory Visit: Payer: Self-pay | Admitting: Nurse Practitioner

## 2014-07-20 ENCOUNTER — Other Ambulatory Visit: Payer: Self-pay | Admitting: Internal Medicine

## 2014-07-20 ENCOUNTER — Other Ambulatory Visit: Payer: Self-pay | Admitting: *Deleted

## 2014-07-20 MED ORDER — HYDROCODONE-ACETAMINOPHEN 5-325 MG PO TABS: 1.0000 | ORAL_TABLET | Freq: Four times a day (QID) | ORAL | Status: DC | PRN

## 2014-07-20 NOTE — Telephone Encounter (Signed)
Spoke with patient, patient states she is no longer taking Prozac (fluoxtine).

## 2014-07-20 NOTE — Telephone Encounter (Signed)
Last filled 06-22-14 looks like.

## 2014-07-21 ENCOUNTER — Telehealth: Payer: Self-pay

## 2014-07-21 NOTE — Telephone Encounter (Signed)
Called patient to inform Rx ready for pick up at front desk. No answer. Left vmail.

## 2014-07-26 ENCOUNTER — Ambulatory Visit: Payer: Self-pay | Admitting: Internal Medicine

## 2014-07-31 ENCOUNTER — Other Ambulatory Visit: Payer: Self-pay

## 2014-07-31 MED ORDER — GABAPENTIN 800 MG PO TABS: ORAL_TABLET | ORAL | Status: DC

## 2014-08-01 ENCOUNTER — Emergency Department (HOSPITAL_BASED_OUTPATIENT_CLINIC_OR_DEPARTMENT_OTHER): Payer: Medicare Other

## 2014-08-01 ENCOUNTER — Encounter (HOSPITAL_BASED_OUTPATIENT_CLINIC_OR_DEPARTMENT_OTHER): Payer: Self-pay | Admitting: *Deleted

## 2014-08-01 ENCOUNTER — Emergency Department (HOSPITAL_BASED_OUTPATIENT_CLINIC_OR_DEPARTMENT_OTHER)
Admission: EM | Admit: 2014-08-01 | Discharge: 2014-08-01 | Disposition: A | Discharge status: Home / Self Care | Payer: Medicare Other | Attending: Emergency Medicine | Admitting: Emergency Medicine

## 2014-08-01 DIAGNOSIS — W1839XA Other fall on same level, initial encounter: Secondary | ICD-10-CM | POA: Diagnosis not present

## 2014-08-01 DIAGNOSIS — S79911A Unspecified injury of right hip, initial encounter: Secondary | ICD-10-CM | POA: Diagnosis not present

## 2014-08-01 DIAGNOSIS — M25551 Pain in right hip: Secondary | ICD-10-CM | POA: Diagnosis not present

## 2014-08-01 DIAGNOSIS — Z79899 Other long term (current) drug therapy: Secondary | ICD-10-CM | POA: Diagnosis not present

## 2014-08-01 DIAGNOSIS — E785 Hyperlipidemia, unspecified: Secondary | ICD-10-CM | POA: Insufficient documentation

## 2014-08-01 DIAGNOSIS — M1990 Unspecified osteoarthritis, unspecified site: Secondary | ICD-10-CM | POA: Insufficient documentation

## 2014-08-01 DIAGNOSIS — Z7982 Long term (current) use of aspirin: Secondary | ICD-10-CM | POA: Diagnosis not present

## 2014-08-01 DIAGNOSIS — I10 Essential (primary) hypertension: Secondary | ICD-10-CM | POA: Insufficient documentation

## 2014-08-01 DIAGNOSIS — S79912A Unspecified injury of left hip, initial encounter: Secondary | ICD-10-CM | POA: Diagnosis present

## 2014-08-01 DIAGNOSIS — Y998 Other external cause status: Secondary | ICD-10-CM | POA: Insufficient documentation

## 2014-08-01 DIAGNOSIS — Y9289 Other specified places as the place of occurrence of the external cause: Secondary | ICD-10-CM | POA: Diagnosis not present

## 2014-08-01 DIAGNOSIS — M25552 Pain in left hip: Secondary | ICD-10-CM | POA: Diagnosis not present

## 2014-08-01 DIAGNOSIS — W19XXXA Unspecified fall, initial encounter: Secondary | ICD-10-CM

## 2014-08-01 DIAGNOSIS — S39012A Strain of muscle, fascia and tendon of lower back, initial encounter: Secondary | ICD-10-CM

## 2014-08-01 DIAGNOSIS — Y9389 Activity, other specified: Secondary | ICD-10-CM | POA: Diagnosis not present

## 2014-08-01 DIAGNOSIS — Z8669 Personal history of other diseases of the nervous system and sense organs: Secondary | ICD-10-CM | POA: Insufficient documentation

## 2014-08-01 DIAGNOSIS — E782 Mixed hyperlipidemia: Secondary | ICD-10-CM | POA: Insufficient documentation

## 2014-08-01 DIAGNOSIS — F329 Major depressive disorder, single episode, unspecified: Secondary | ICD-10-CM | POA: Diagnosis not present

## 2014-08-01 DIAGNOSIS — M5136 Other intervertebral disc degeneration, lumbar region: Secondary | ICD-10-CM | POA: Diagnosis not present

## 2014-08-01 MED ORDER — HYDROMORPHONE HCL 1 MG/ML IJ SOLN
0.5000 mg | Freq: Once | INTRAMUSCULAR | Status: AC
  Administered 2014-08-01: 0.5 mg via INTRAMUSCULAR
  Filled 2014-08-01: qty 1

## 2014-08-01 MED ORDER — HYDROCODONE-ACETAMINOPHEN 5-325 MG PO TABS: 1.0000 | ORAL_TABLET | Freq: Four times a day (QID) | ORAL | Status: DC | PRN

## 2014-08-01 NOTE — ED Notes (Signed)
Fell Friday morning. Now c/o hip pain radiating into her legs, shoulders, back

## 2014-08-01 NOTE — ED Provider Notes (Signed)
CSN: [id number]     Arrival date & time 08/01/14  1638 History  This chart was scribed for [employer]. Alvino Chapel, MD by Edison Simon, ED Scribe. This patient was seen in room MH01/MH01 and the patient's care was started at 6:49 PM.    Chief Complaint  Patient presents with  . Hip Pain   The history is provided by the patient. No language interpreter was used.    HPI Comments: [name] is a 74 y.o. female who presents to the Emergency Department complaining of bilateral hip pain with onset 2 days ago, worsening today. She states she was sitting on the side of her bed when she slid off and fell to the floor, landing on her bottom. She reports pain to her hips and shoulders. She has been ambulating. She states she does not normally have hip pain. She states she does have pain in right foot due to neuropathy for which she uses pain medication. She reports 2 prior low back surgeries and 2 prior cervical spine surgeries; she called her surgeon but his earliest available appointment was 3/31. She denies incontinence.  Past Medical History  Diagnosis Date  . Hypertension   . GERD (gastroesophageal reflux disease)     hx pud '98  . Arthritis     djd  . Restless leg syndrome   . 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 Dysrhythmia     hx tachy palpitations  . Headache(784.0)     remote hx of migraines  . Neuromuscular disorder     peripheral neuropathy, FEET AND LEGS  . Depression     bh admission '09  . Stroke     ? 6 months ago - tests okay - [facility]   . Cellulitis     10/11/11 hospitalized for cellulitis   . Hyperlipemia   . Peripheral neuropathy   . Elbow fracture, left April 2015   Past Surgical History  Procedure Laterality Date  . Cervical disc surgery x2  2011  . Rt carotid enarterectomy  2011  . Rt knee arthroscopy  '99  . Left knee arthroscopy  2001  . Hematoma evacuation  2011    s/p rt cea  . Joint replacement  '01    total knee replacement, LEFT  .  Abdominal hysterectomy  1975    bil oophorectomy  . Back surgery   MANY YRS AGO    laminectomy x3  . Cholecystectomy  1993  . Total knee arthroplasty  09/13/2011    Procedure: TOTAL KNEE ARTHROPLASTY;  Surgeon: Magnus Sinning, MD;  Location: [facility];  Service: Orthopedics;  Laterality: Right;  . Right knee replacement      09/2011   . Carpal tunnel release  07/09/2012    Procedure: CARPAL TUNNEL RELEASE;  Surgeon: Magnus Sinning, MD;  Location: [facility];  Service: Orthopedics;  Laterality: Left;  . Finger arthroplasty  07/09/2012    Procedure: FINGER ARTHROPLASTY;  Surgeon: Magnus Sinning, MD;  Location: [facility];  Service: Orthopedics;  Laterality: Left;  Interposition Arthroplasty CMC Joint Thumb Left   . Lipoma excision  07/09/2012    Procedure: EXCISION LIPOMA;  Surgeon: Magnus Sinning, MD;  Location: [facility];  Service: Orthopedics;  Laterality: Left;  Excision Lipoma Dorsum Left Wrist    Family History  Problem Relation Age of Onset  . Congestive Heart Failure Father   . Congestive Heart Failure Sister   . Alzheimer's disease  Mother   . Diabetes Brother   . Arthritis Brother    History  Substance Use Topics  . Smoking status: Never Smoker   . Smokeless tobacco: Never Used  . Alcohol Use: No   OB History    No data available     Review of Systems  Musculoskeletal: Positive for neck pain.       Bilateral hip pain Bilateral shoulder pain  Neurological:       No incontinence  All other systems reviewed and are negative.     Allergies  Sulfa drugs cross reactors  Home Medications   Prior to Admission medications   Medication Sig Start Date End Date Taking? Authorizing Provider  acetaminophen (TYLENOL) 500 MG tablet Take 500 mg by mouth every 6 (six) hours as needed. Pain    Historical Provider, MD  amLODipine (NORVASC) 10 MG tablet Take 1 tablet by mouth at  bedtime 07/20/14   [name] L [name], DO  amoxicillin (AMOXIL) 500 MG capsule Take 500 mg by mouth. Take 4 pills  1 hour prior to dentist appointment    Historical Provider, MD  aspirin 325 MG tablet Take 325 mg by mouth daily.    Historical Provider, MD  atorvastatin (LIPITOR) 20 MG tablet Take 1 tablet by mouth at  bedtime 06/21/14   [name] L [name], DO  calcium carbonate 1250 MG capsule Take 1,250 mg by mouth daily.     Historical Provider, MD  carbidopa-levodopa (SINEMET CR) 50-200 MG per tablet Take 1 tablet by mouth at bedtime. 06/10/14   [name] L [name], DO  carbidopa-levodopa (SINEMET IR) 25-250 MG per tablet Take 1 tablet by mouth 3  times a day 06/07/14   Lauree Chandler, NP  cholecalciferol (VITAMIN D) 1000 UNITS tablet Take 1,000 Units by mouth daily.    Historical Provider, MD  FLUoxetine (PROZAC) 20 MG tablet Take 1 tablet (20 mg total) by mouth daily. 05/24/14   [name] L [name], DO  gabapentin (NEURONTIN) 800 MG tablet Take 1 tablet by mouth 4  times daily 07/31/14   Kathrynn Ducking, MD  hydrochlorothiazide (HYDRODIURIL) 25 MG tablet Take 1 tablet by mouth in  the morning 07/20/14   [name] L [name], DO  HYDROcodone-acetaminophen (NORCO/VICODIN) 5-325 MG per tablet Take 1 tablet by mouth every 6 (six) hours as needed (MUST LAST 28 DAYS). 07/20/14   Kathrynn Ducking, MD  meclizine (ANTIVERT) 25 MG tablet Take one tablet by mouth once daily as needed for dizziness. 05/26/14   [name] L [name], DO  mirtazapine (REMERON) 15 MG tablet Take one tablet by mouth every night at bedtime 06/10/14   [name] L [name], DO  nystatin (MYCOSTATIN/NYSTOP) 100000 UNIT/GM POWD Apply beneath left breast for yeast rash 01/11/14   [name] L [name], DO  pyridOXINE (VITAMIN B-6) 50 MG tablet Take 50 mg by mouth daily.    Historical Provider, MD  SUMAtriptan (IMITREX) 50 MG tablet See AUX Label 07/20/14   [name] L [name], DO   BP 122/61 mmHg  Pulse 106  Temp(Src) 98.8 F (37.1 C) (Oral)  Resp 18  Ht 5\' 5"  (1.651 m)  Wt 225 lb (102.059 kg)  BMI 37.44 kg/m2  SpO2 99% Physical Exam  Constitutional: [name] oriented to person, place, and  time. She appears well-developed and well-nourished.  HENT:  Head: Normocephalic and atraumatic.  Eyes: Conjunctivae are normal.  Neck: Normal range of motion. Neck supple.  Neck nontender  Cardiovascular: Normal rate, regular rhythm and normal heart sounds.  No murmur heard. Pulmonary/Chest: Effort normal and breath sounds normal. No respiratory distress. She has no wheezes. She has no rales.  Abdominal: There is no tenderness.  Musculoskeletal: Normal range of motion.  Good grip strength bilaterally Tenderness on right low back near SI joint Good flexion and extension in bilateral feet Pain with straight leg raise on both sides, worse on right  Neurological: [name] alert and oriented to person, place, and time.  Good sensation in bilateral feet  Skin: Skin is warm and dry.  Psychiatric: She has a normal mood and affect.  Nursing note and vitals reviewed.   ED Course  Procedures (including critical care time)  COORDINATION OF CARE: 6:55 PM Discussed treatment plan with patient at beside, the patient agrees with the plan and has no further questions at this time.  Labs Review Labs Reviewed - No data to display  Imaging Review No results found.   EKG Interpretation None      MDM   Final diagnoses:  None    Patient with fall a few days ago. Back pain radiating down right leg. May be sciatica. Has history of previous back surgeries. X-rays reassuring. Will discharge with a small amount of pain medicines. Will follow-up with her neurosurgeon. No red flags.  I personally performed the services described in this documentation, which was scribed in my presence. The recorded information has been reviewed and is accurate.      Jasper Riling. Alvino Chapel, MD 08/01/14 2032

## 2014-08-01 NOTE — Discharge Instructions (Signed)

## 2014-08-05 ENCOUNTER — Ambulatory Visit (INDEPENDENT_AMBULATORY_CARE_PROVIDER_SITE_OTHER): Payer: Medicare Other | Admitting: Internal Medicine

## 2014-08-05 ENCOUNTER — Encounter: Payer: Self-pay | Admitting: Internal Medicine

## 2014-08-05 VITALS — BP 120/78 | HR 124 | Temp 97.7°F | Resp 20 | Ht 65.0 in | Wt 219.8 lb

## 2014-08-05 DIAGNOSIS — M5136 Other intervertebral disc degeneration, lumbar region: Secondary | ICD-10-CM | POA: Insufficient documentation

## 2014-08-05 DIAGNOSIS — M25551 Pain in right hip: Secondary | ICD-10-CM | POA: Diagnosis not present

## 2014-08-05 DIAGNOSIS — F329 Major depressive disorder, single episode, unspecified: Secondary | ICD-10-CM | POA: Diagnosis not present

## 2014-08-05 DIAGNOSIS — I10 Essential (primary) hypertension: Secondary | ICD-10-CM

## 2014-08-05 DIAGNOSIS — G4700 Insomnia, unspecified: Secondary | ICD-10-CM | POA: Insufficient documentation

## 2014-08-05 DIAGNOSIS — M51369 Other intervertebral disc degeneration, lumbar region without mention of lumbar back pain or lower extremity pain: Secondary | ICD-10-CM | POA: Insufficient documentation

## 2014-08-05 DIAGNOSIS — F32A Depression, unspecified: Secondary | ICD-10-CM

## 2014-08-05 DIAGNOSIS — M25559 Pain in unspecified hip: Secondary | ICD-10-CM | POA: Insufficient documentation

## 2014-08-05 MED ORDER — VORTIOXETINE HBR 20 MG PO TABS: 20.0000 mg | ORAL_TABLET | Freq: Every day | ORAL | Status: DC

## 2014-08-05 NOTE — Patient Instructions (Signed)
Cut remeron in 1/2 for one week, then stop.  Belsomra 10mg  at bedtime samples.

## 2014-08-05 NOTE — Progress Notes (Signed)
Patient ID: [name], female   DOB: [birth year], 74 y.o.   MRN: [id number]   Location:  [facility]  Code Status: has Advanced Directives- copy requested  Allergies  Allergen Reactions  . Sulfa Drugs Cross Reactors Nausea And Vomiting    Chief Complaint  Patient presents with  . Medical Management of Chronic Issues    HPI: Patient is a 74 y.o. female seen in the office today for followup of chronic medical issues.  She switched to Brintellix. States it doesn't help much, but she thinks she is better since she has gotten through the holidays. Has gel nails over her nails and has been able to avoid chewing them. Would like to continue on the brintellix. Has noted decreased appetite.   Fell at home. Slipped off the side of the bed. Had some hip pain, went to ED, xray was done, but did not show fracture. Has an appointment with Dr. Ellene Route on March 31st, but is trying to get worked in.   Pain continues, is not as bad as it was, has been using hydrocodone she had on hand for another. Pain does not worsen with walking, but when she moves she feels like lightening down her left leg. Has ruptured L5-S1 and has had injections by Dr. Ellene Route in the past. This pain is similar to the pain she experienced at that time. Is able to walk. Xray from ED visit showed degenerative disc disease. Using hydrocodone for pain relief.   Review of Systems:  Review of Systems  Constitutional: Negative for fever, chills and weight loss.  Respiratory: Negative for cough, hemoptysis, shortness of breath and wheezing.   Cardiovascular: Negative for chest pain and palpitations.  Gastrointestinal: Negative for heartburn, nausea, vomiting and abdominal pain.  Psychiatric/Behavioral: Positive for depression. Negative for suicidal ideas.       Not as much    Past Medical History  Diagnosis Date  . Hypertension   . GERD (gastroesophageal reflux disease)     hx pud '98    . Arthritis     djd  . Restless leg syndrome   . 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 Dysrhythmia     hx tachy palpitations  . Headache(784.0)     remote hx of migraines  . Neuromuscular disorder     peripheral neuropathy, FEET AND LEGS  . Depression     bh admission '09  . Stroke     ? 6 months ago - tests okay - [facility]   . Cellulitis     10/11/11 hospitalized for cellulitis   . Hyperlipemia   . Peripheral neuropathy   . Elbow fracture, left April 2015    Past Surgical History  Procedure Laterality Date  . Cervical disc surgery x2  2011  . Rt carotid enarterectomy  2011  . Rt knee arthroscopy  '99  . Left knee arthroscopy  2001  . Hematoma evacuation  2011    s/p rt cea  . Joint replacement  '01    total knee replacement, LEFT  . Abdominal hysterectomy  1975    bil oophorectomy  . Back surgery   MANY YRS AGO    laminectomy x3  . Cholecystectomy  1993  . Total knee arthroplasty  09/13/2011    Procedure: TOTAL KNEE ARTHROPLASTY;  Surgeon: Magnus Sinning, MD;  Location: [facility];  Service: Orthopedics;  Laterality: Right;  . Right knee replacement  09/2011   . Carpal tunnel release  07/09/2012    Procedure: CARPAL TUNNEL RELEASE;  Surgeon: Magnus Sinning, MD;  Location: [facility];  Service: Orthopedics;  Laterality: Left;  . Finger arthroplasty  07/09/2012    Procedure: FINGER ARTHROPLASTY;  Surgeon: Magnus Sinning, MD;  Location: [facility];  Service: Orthopedics;  Laterality: Left;  Interposition Arthroplasty CMC Joint Thumb Left   . Lipoma excision  07/09/2012    Procedure: EXCISION LIPOMA;  Surgeon: Magnus Sinning, MD;  Location: [facility];  Service: Orthopedics;  Laterality: Left;  Excision Lipoma Dorsum Left Wrist     Social History:   reports that she has never smoked. She has never used smokeless tobacco. She reports that she does not drink alcohol or use illicit drugs.  Family History  Problem Relation Age of Onset  . Congestive Heart Failure  Father   . Congestive Heart Failure Sister   . Alzheimer's disease Mother   . Diabetes Brother   . Arthritis Brother     Medications: Patient's Medications  New Prescriptions   No medications on file  Previous Medications   ACETAMINOPHEN (TYLENOL) 500 MG TABLET    Take 500 mg by mouth every 6 (six) hours as needed. Pain   AMLODIPINE (NORVASC) 10 MG TABLET    Take 1 tablet by mouth at  bedtime   AMOXICILLIN (AMOXIL) 500 MG CAPSULE    Take 500 mg by mouth. Take 4 pills 1 hour prior to dentist appointment   ASPIRIN 325 MG TABLET    Take 325 mg by mouth daily.   ATORVASTATIN (LIPITOR) 20 MG TABLET    Take 1 tablet by mouth at  bedtime   CALCIUM CARBONATE 1250 MG CAPSULE    Take 1,250 mg by mouth daily.    CARBIDOPA-LEVODOPA (SINEMET CR) 50-200 MG PER TABLET    Take 1 tablet by mouth at bedtime.   CARBIDOPA-LEVODOPA (SINEMET IR) 25-250 MG PER TABLET    Take 1 tablet by mouth 3  times a day   CHOLECALCIFEROL (VITAMIN D) 1000 UNITS TABLET    Take 1,000 Units by mouth daily.   GABAPENTIN (NEURONTIN) 800 MG TABLET    Take 1 tablet by mouth 4  times daily   HYDROCHLOROTHIAZIDE (HYDRODIURIL) 25 MG TABLET    Take 1 tablet by mouth in  the morning   HYDROCODONE-ACETAMINOPHEN (NORCO/VICODIN) 5-325 MG PER TABLET    Take 1 tablet by mouth every 6 (six) hours as needed (MUST LAST 28 DAYS).   MECLIZINE (ANTIVERT) 25 MG TABLET    Take one tablet by mouth once daily as needed for dizziness.   MIRTAZAPINE (REMERON) 15 MG TABLET    Take one tablet by mouth every night at bedtime   NYSTATIN (MYCOSTATIN/NYSTOP) 100000 UNIT/GM POWD    Apply beneath left breast for yeast rash   PYRIDOXINE (VITAMIN B-6) 50 MG TABLET    Take 50 mg by mouth daily.   SUMATRIPTAN (IMITREX) 50 MG TABLET    See AUX Label  Modified Medications   No medications on file  Discontinued Medications   FLUOXETINE (PROZAC) 20 MG TABLET    Take 1 tablet (20 mg total) by mouth daily.     Physical Exam: Filed Vitals:   08/05/14 1340  BP:  120/78  Pulse: 124  Temp: 97.7 F (36.5 C)  TempSrc: Oral  Resp: 20  Height: 5\' 5"  (1.651 m)  Weight: 219 lb 12.8 oz (99.701 kg)  SpO2: 98%   Physical Exam  Constitutional:  [name] oriented to person, place, and time. She appears well-developed and well-nourished.  Cardiovascular: Regular rhythm and normal heart sounds.   tachycardic  Pulmonary/Chest: Effort normal and breath sounds normal. No respiratory distress. She has no wheezes.  Musculoskeletal: Normal range of motion. She exhibits tenderness.  Tenderness in r illiac crest and hip area, pain with active ROM R hip  Neurological: [name] alert and oriented to person, place, and time.  Skin: Skin is warm and dry.  Dry skin on lower extremities    Labs reviewed: Basic Metabolic Panel:  Recent Labs  01/11/14 1514  NA 140  K 3.6  CL 89*  CO2 24  GLUCOSE 149*  BUN 19  CREATININE 1.00  CALCIUM 10.1   Liver Function Tests:  Recent Labs  01/11/14 1514  AST 10  ALT <5  ALKPHOS 117  BILITOT 0.2  PROT 7.0   No results for input(s): LIPASE, AMYLASE in the last 8760 hours. No results for input(s): AMMONIA in the last 8760 hours. CBC:  Recent Labs  01/11/14 1514  WBC 12.4*  NEUTROABS 8.8*  HGB 12.7  HCT 38.2  MCV 93   Lipid Panel:  Recent Labs  01/11/14 1514  CHOL 199  HDL 40  LDLCALC Comment  TRIG 462*  CHOLHDL 5.0*   Lab Results  Component Value Date   HGBA1C 5.3 10/12/2011     Assessment/Plan 1. Depression - increase brintellix to 20mg  from 10mg  (faxed) -Vortioxetine HBr (BRINTELLIX) 20 MG TABS; Take 20 mg by mouth daily.  Dispense: 90 tablet; Refill: 1  2. Degenerative disc disease, lumbar - tells me Dr. Clarice Pole office requested she get the CT of her back prior to her visit - CT Lumbar Spine W Wo Contrast; Future  3. Hip joint painful on movement, right - had xrays at ED which were negative for fracture -she has been on bedrest and is doing better now -bmp done today  - CT Lumbar  Spine W Wo Contrast; Future  4. Essential hypertension -bp at goal, no changes - Basic metabolic panel  5.  Insomnia Decrease remeron to 7.5mg  for one week, then stop Start belsomra 10mg  daily for sleep  Labs/tests ordered:   Orders Placed This Encounter  Procedures  . CT Lumbar Spine W Wo Contrast    Standing Status: Future     Number of Occurrences:      Standing Expiration Date: 11/05/2015    Order Specific Question:  Reason for Exam (SYMPTOM  OR DIAGNOSIS REQUIRED)    Answer:  increased pain in L5/S1 with right hip pain since fall    Order Specific Question:  Preferred imaging location?    Answer:  [facility]  . Basic metabolic panel    Next appt:  3 mos  Areeb Corron L. Aisia Correira, D.O. [facility] [address], [place] [address] Cell Phone (Mon-Fri 8am-5pm):  [phone] On Call:  [phone] & follow prompts after 5pm & weekends Office Phone:  [phone] Office Fax:  [phone]

## 2014-08-06 ENCOUNTER — Telehealth: Payer: Self-pay | Admitting: *Deleted

## 2014-08-06 MED ORDER — SUVOREXANT 10 MG PO TABS: 10.0000 mg | ORAL_TABLET | Freq: Every day | ORAL | Status: DC

## 2014-08-06 NOTE — Telephone Encounter (Signed)
Patient called and wanted to let you know that the [place] worked well and would like a Rx called into [facility]. Printed Rx for Dr. Mariea Clonts to sign and will fax to pharmacy

## 2014-08-06 NOTE — Addendum Note (Signed)
Addended by: Logan Bores on: 08/06/2014 09:34 AM   Modules accepted: Orders

## 2014-08-07 ENCOUNTER — Emergency Department (HOSPITAL_COMMUNITY)
Admission: EM | Admit: 2014-08-07 | Discharge: 2014-08-10 | Payer: Medicare Other | Attending: Emergency Medicine | Admitting: Emergency Medicine

## 2014-08-07 ENCOUNTER — Encounter (HOSPITAL_COMMUNITY): Payer: Self-pay | Admitting: Emergency Medicine

## 2014-08-07 DIAGNOSIS — Z7982 Long term (current) use of aspirin: Secondary | ICD-10-CM | POA: Insufficient documentation

## 2014-08-07 DIAGNOSIS — F329 Major depressive disorder, single episode, unspecified: Secondary | ICD-10-CM | POA: Insufficient documentation

## 2014-08-07 DIAGNOSIS — I10 Essential (primary) hypertension: Secondary | ICD-10-CM | POA: Diagnosis not present

## 2014-08-07 DIAGNOSIS — G629 Polyneuropathy, unspecified: Secondary | ICD-10-CM | POA: Insufficient documentation

## 2014-08-07 DIAGNOSIS — Z79899 Other long term (current) drug therapy: Secondary | ICD-10-CM | POA: Diagnosis not present

## 2014-08-07 DIAGNOSIS — E876 Hypokalemia: Secondary | ICD-10-CM | POA: Insufficient documentation

## 2014-08-07 DIAGNOSIS — G43909 Migraine, unspecified, not intractable, without status migrainosus: Secondary | ICD-10-CM | POA: Diagnosis not present

## 2014-08-07 DIAGNOSIS — Z8673 Personal history of transient ischemic attack (TIA), and cerebral infarction without residual deficits: Secondary | ICD-10-CM | POA: Insufficient documentation

## 2014-08-07 DIAGNOSIS — Z8781 Personal history of (healed) traumatic fracture: Secondary | ICD-10-CM | POA: Diagnosis not present

## 2014-08-07 DIAGNOSIS — E785 Hyperlipidemia, unspecified: Secondary | ICD-10-CM | POA: Diagnosis not present

## 2014-08-07 DIAGNOSIS — K219 Gastro-esophageal reflux disease without esophagitis: Secondary | ICD-10-CM | POA: Insufficient documentation

## 2014-08-07 DIAGNOSIS — M1990 Unspecified osteoarthritis, unspecified site: Secondary | ICD-10-CM | POA: Insufficient documentation

## 2014-08-07 DIAGNOSIS — F333 Major depressive disorder, recurrent, severe with psychotic symptoms: Secondary | ICD-10-CM | POA: Diagnosis not present

## 2014-08-07 DIAGNOSIS — Z872 Personal history of diseases of the skin and subcutaneous tissue: Secondary | ICD-10-CM | POA: Insufficient documentation

## 2014-08-07 DIAGNOSIS — F332 Major depressive disorder, recurrent severe without psychotic features: Secondary | ICD-10-CM | POA: Diagnosis present

## 2014-08-07 DIAGNOSIS — G2581 Restless legs syndrome: Secondary | ICD-10-CM | POA: Insufficient documentation

## 2014-08-07 DIAGNOSIS — F32A Depression, unspecified: Secondary | ICD-10-CM

## 2014-08-07 LAB — CBC WITH DIFFERENTIAL/PLATELET
Basophils Absolute: 0 10*3/uL (ref 0.0–0.1)
Basophils Relative: 0 % (ref 0–1)
Eosinophils Absolute: 0.1 10*3/uL (ref 0.0–0.7)
Eosinophils Relative: 1 % (ref 0–5)
HCT: 43.5 % (ref 36.0–46.0)
Hemoglobin: 14.2 g/dL (ref 12.0–15.0)
LYMPHS ABS: 2.6 10*3/uL (ref 0.7–4.0)
Lymphocytes Relative: 26 % (ref 12–46)
MCH: 30.9 pg (ref 26.0–34.0)
MCHC: 32.6 g/dL (ref 30.0–36.0)
MCV: 94.6 fL (ref 78.0–100.0)
MONOS PCT: 7 % (ref 3–12)
Monocytes Absolute: 0.7 10*3/uL (ref 0.1–1.0)
NEUTROS PCT: 66 % (ref 43–77)
Neutro Abs: 6.6 10*3/uL (ref 1.7–7.7)
Platelets: 356 10*3/uL (ref 150–400)
RBC: 4.6 MIL/uL (ref 3.87–5.11)
RDW: 14.1 % (ref 11.5–15.5)
WBC: 10.1 10*3/uL (ref 4.0–10.5)

## 2014-08-07 LAB — BASIC METABOLIC PANEL

## 2014-08-07 LAB — COMPREHENSIVE METABOLIC PANEL
ALBUMIN: 4.4 g/dL (ref 3.5–5.2)
ALK PHOS: 80 U/L (ref 39–117)
ALT: <5 U/L (ref 0–35)
AST: 22 U/L (ref 0–37)
Anion gap: 16 — ABNORMAL HIGH (ref 5–15)
BUN: 17 mg/dL (ref 6–23)
CO2: 26 mmol/L (ref 19–32)
Calcium: 9.1 mg/dL (ref 8.4–10.5)
Chloride: 90 mmol/L — ABNORMAL LOW (ref 96–112)
Creatinine, Ser: 1.12 mg/dL — ABNORMAL HIGH (ref 0.50–1.10)
GFR calc Af Amer: 55 mL/min — ABNORMAL LOW (ref >90)
GFR calc non Af Amer: 48 mL/min — ABNORMAL LOW (ref >90)
Glucose, Bld: 201 mg/dL — ABNORMAL HIGH (ref 70–99)
Potassium: 2.5 mmol/L — CL (ref 3.5–5.1)
Sodium: 132 mmol/L — ABNORMAL LOW (ref 135–145)
Total Bilirubin: 1 mg/dL (ref 0.3–1.2)
Total Protein: 8.2 g/dL (ref 6.0–8.3)

## 2014-08-07 LAB — RAPID URINE DRUG SCREEN, HOSP PERFORMED
AMPHETAMINES: NOT DETECTED
Barbiturates: NOT DETECTED
Benzodiazepines: NOT DETECTED
COCAINE: NOT DETECTED
Opiates: NOT DETECTED
Tetrahydrocannabinol: NOT DETECTED

## 2014-08-07 LAB — ETHANOL: Alcohol, Ethyl (B): <5 mg/dL (ref 0–9)

## 2014-08-07 MED ORDER — AMLODIPINE BESYLATE 10 MG PO TABS
10.0000 mg | ORAL_TABLET | Freq: Every day | ORAL | Status: DC
  Administered 2014-08-07 – 2014-08-10 (×4): 10 mg via ORAL
  Filled 2014-08-07 (×4): qty 1

## 2014-08-07 MED ORDER — ATORVASTATIN CALCIUM 20 MG PO TABS
20.0000 mg | ORAL_TABLET | Freq: Every day | ORAL | Status: DC
  Administered 2014-08-07 – 2014-08-09 (×3): 20 mg via ORAL
  Filled 2014-08-07 (×5): qty 1

## 2014-08-07 MED ORDER — CALCIUM CARBONATE 1250 (500 CA) MG PO TABS
1250.0000 mg | ORAL_TABLET | Freq: Every day | ORAL | Status: DC
  Administered 2014-08-07 – 2014-08-09 (×3): 1250 mg via ORAL
  Filled 2014-08-07 (×5): qty 1

## 2014-08-07 MED ORDER — LORAZEPAM 1 MG PO TABS
1.0000 mg | ORAL_TABLET | Freq: Three times a day (TID) | ORAL | Status: DC | PRN
  Administered 2014-08-07 – 2014-08-09 (×6): 1 mg via ORAL
  Filled 2014-08-07 (×6): qty 1

## 2014-08-07 MED ORDER — ASPIRIN 325 MG PO TABS
325.0000 mg | ORAL_TABLET | Freq: Every day | ORAL | Status: DC
  Administered 2014-08-07 – 2014-08-10 (×4): 325 mg via ORAL
  Filled 2014-08-07 (×4): qty 1

## 2014-08-07 MED ORDER — VITAMIN D3 25 MCG (1000 UNIT) PO TABS
1000.0000 [IU] | ORAL_TABLET | Freq: Every day | ORAL | Status: DC
  Administered 2014-08-07 – 2014-08-10 (×4): 1000 [IU] via ORAL
  Filled 2014-08-07 (×4): qty 1

## 2014-08-07 MED ORDER — CARBIDOPA-LEVODOPA ER 50-200 MG PO TBCR
1.0000 | EXTENDED_RELEASE_TABLET | Freq: Every day | ORAL | Status: DC
  Administered 2014-08-07 – 2014-08-09 (×3): 1 via ORAL
  Filled 2014-08-07 (×4): qty 1

## 2014-08-07 MED ORDER — ACETAMINOPHEN 325 MG PO TABS
650.0000 mg | ORAL_TABLET | ORAL | Status: DC | PRN
  Administered 2014-08-07 (×2): 650 mg via ORAL
  Filled 2014-08-07 (×2): qty 2

## 2014-08-07 MED ORDER — IBUPROFEN 200 MG PO TABS
600.0000 mg | ORAL_TABLET | Freq: Three times a day (TID) | ORAL | Status: DC | PRN
  Administered 2014-08-08 – 2014-08-10 (×4): 600 mg via ORAL
  Filled 2014-08-07 (×4): qty 3

## 2014-08-07 MED ORDER — CARBIDOPA-LEVODOPA 25-250 MG PO TABS
1.0000 | ORAL_TABLET | Freq: Three times a day (TID) | ORAL | Status: DC
  Administered 2014-08-07 – 2014-08-10 (×9): 1 via ORAL
  Filled 2014-08-07 (×12): qty 1

## 2014-08-07 MED ORDER — POTASSIUM CHLORIDE ER 20 MEQ PO TBCR: 20.0000 meq | EXTENDED_RELEASE_TABLET | Freq: Every day | ORAL | Status: DC

## 2014-08-07 MED ORDER — ALUM & MAG HYDROXIDE-SIMETH 200-200-20 MG/5ML PO SUSP: 30.0000 mL | ORAL | Status: DC | PRN

## 2014-08-07 MED ORDER — MECLIZINE HCL 25 MG PO TABS: 25.0000 mg | ORAL_TABLET | Freq: Two times a day (BID) | ORAL | Status: DC | PRN

## 2014-08-07 MED ORDER — HYDROCODONE-ACETAMINOPHEN 5-325 MG PO TABS
1.0000 | ORAL_TABLET | Freq: Four times a day (QID) | ORAL | Status: DC | PRN
  Administered 2014-08-08 – 2014-08-10 (×8): 1 via ORAL
  Filled 2014-08-07 (×8): qty 1

## 2014-08-07 MED ORDER — GABAPENTIN 400 MG PO CAPS
800.0000 mg | ORAL_CAPSULE | Freq: Four times a day (QID) | ORAL | Status: DC
  Administered 2014-08-07 – 2014-08-10 (×13): 800 mg via ORAL
  Filled 2014-08-07 (×14): qty 2

## 2014-08-07 MED ORDER — POTASSIUM CHLORIDE CRYS ER 20 MEQ PO TBCR
60.0000 meq | EXTENDED_RELEASE_TABLET | Freq: Once | ORAL | Status: AC
Start: 2014-08-07 — End: 2014-08-07
  Administered 2014-08-07: 60 meq via ORAL
  Filled 2014-08-07: qty 3

## 2014-08-07 MED ORDER — HYDROCHLOROTHIAZIDE 25 MG PO TABS
25.0000 mg | ORAL_TABLET | Freq: Every day | ORAL | Status: DC
  Administered 2014-08-09 – 2014-08-10 (×2): 25 mg via ORAL
  Filled 2014-08-07 (×4): qty 1

## 2014-08-07 MED ORDER — VORTIOXETINE HBR 20 MG PO TABS
20.0000 mg | ORAL_TABLET | Freq: Every day | ORAL | Status: DC
  Administered 2014-08-07 – 2014-08-08 (×2): 20 mg via ORAL
  Filled 2014-08-07 (×2): qty 20

## 2014-08-07 MED ORDER — CARBIDOPA-LEVODOPA 25-250 MG PO TABS
1.0000 | ORAL_TABLET | Freq: Three times a day (TID) | ORAL | Status: DC
  Administered 2014-08-07: 1 via ORAL
  Filled 2014-08-07 (×3): qty 1

## 2014-08-07 MED ORDER — VITAMIN B-6 50 MG PO TABS
50.0000 mg | ORAL_TABLET | Freq: Every day | ORAL | Status: DC
  Administered 2014-08-07 – 2014-08-10 (×4): 50 mg via ORAL
  Filled 2014-08-07 (×4): qty 1

## 2014-08-07 MED ORDER — SUVOREXANT 10 MG PO TABS
10.0000 mg | ORAL_TABLET | Freq: Every day | ORAL | Status: DC
  Filled 2014-08-07 (×2): qty 1

## 2014-08-07 MED ORDER — MIRTAZAPINE 30 MG PO TABS
15.0000 mg | ORAL_TABLET | Freq: Every day | ORAL | Status: DC
  Administered 2014-08-07 – 2014-08-09 (×3): 15 mg via ORAL
  Filled 2014-08-07 (×3): qty 1

## 2014-08-07 MED ORDER — ONDANSETRON HCL 4 MG PO TABS: 4.0000 mg | ORAL_TABLET | Freq: Three times a day (TID) | ORAL | Status: DC | PRN

## 2014-08-07 MED ORDER — ZOLPIDEM TARTRATE 5 MG PO TABS: 5.0000 mg | ORAL_TABLET | Freq: Every evening | ORAL | Status: DC | PRN

## 2014-08-07 MED ORDER — SUMATRIPTAN SUCCINATE 25 MG PO TABS
25.0000 mg | ORAL_TABLET | ORAL | Status: DC | PRN
  Filled 2014-08-07: qty 1

## 2014-08-07 NOTE — ED Notes (Signed)
Dr Marchia Bond- Psychiatrist and Reginold Agent, NP have decided that pt meets inpatient criteria.  [name], [place] made aware and pt's son made aware.

## 2014-08-07 NOTE — ED Notes (Signed)
Pt reported that son is coming to visit at [facility].  Pt will be discharged at that time

## 2014-08-07 NOTE — ED Notes (Signed)
TTS consult in progress. °

## 2014-08-07 NOTE — BH Assessment (Signed)
Assessment Note  [name] is an 74 y.o. female who presented orientated x4 and alert, mood "depressed and sad", affect congruent with mood, denied SI, HI, and psychosis, and denied any alcohol or other drug use. Patient reports prior to Christmas 2015, she started grieving the death of her Spouse who died 64 years ago.  She reports not being able to "shake the grief."  Patient reports requesting her adult Son bring her to [facility] because she has loss interest in completing daily hygiene-including not bathing and combing hair, household chores-not washing dishes and picking up items off the floor, and missing doctor's appointments-cancaling appointments.  Patient reports experiencing daily crying episodes, loss of appetite-losing 6 pounds over 2 week period, and sleeping 2 to 3 hours per night and then waking up and not being able to go back to sleep.  Patient reports being diagnosed with depression 20 years ago and being prescribed Prozac.  Patient reports being medication compliant.    She reports recently her Primary Care Physician changed her antidepressant and sleep medications.  She reports being hospitalized for depression 20 years ago at [facility].  Patient reports wanting help with depression and grief.  She reports wanting to see someone on a weekly basis to talk with about depression and grief.          Axis I: Major Depression, Recurrent severe Axis II: Deferred Axis IV: occupational problems and problems related to social environment Axis V: 51-60 moderate symptoms  Past Medical History:  Past Medical History  Diagnosis Date  . Hypertension   . GERD (gastroesophageal reflux disease)     hx pud '98  . Arthritis     djd  . Restless leg syndrome   . 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 Dysrhythmia     hx tachy palpitations  . Headache(784.0)     remote hx of migraines  . Neuromuscular disorder     peripheral neuropathy, FEET AND LEGS  . Depression     bh  admission '09  . Stroke     ? 6 months ago - tests okay - [facility]   . Cellulitis     10/11/11 hospitalized for cellulitis   . Hyperlipemia   . Peripheral neuropathy   . Elbow fracture, left April 2015    Past Surgical History  Procedure Laterality Date  . Cervical disc surgery x2  2011  . Rt carotid enarterectomy  2011  . Rt knee arthroscopy  '99  . Left knee arthroscopy  2001  . Hematoma evacuation  2011    s/p rt cea  . Joint replacement  '01    total knee replacement, LEFT  . Abdominal hysterectomy  1975    bil oophorectomy  . Back surgery   MANY YRS AGO    laminectomy x3  . Cholecystectomy  1993  . Total knee arthroplasty  09/13/2011    Procedure: TOTAL KNEE ARTHROPLASTY;  Surgeon: Magnus Sinning, MD;  Location: [facility];  Service: Orthopedics;  Laterality: Right;  . Right knee replacement      09/2011   . Carpal tunnel release  07/09/2012    Procedure: CARPAL TUNNEL RELEASE;  Surgeon: Magnus Sinning, MD;  Location: [facility];  Service: Orthopedics;  Laterality: Left;  . Finger arthroplasty  07/09/2012    Procedure: FINGER ARTHROPLASTY;  Surgeon: Magnus Sinning, MD;  Location: [facility];  Service: Orthopedics;  Laterality: Left;  Interposition Arthroplasty [facility] Joint  Thumb Left   . Lipoma excision  07/09/2012    Procedure: EXCISION LIPOMA;  Surgeon: Magnus Sinning, MD;  [facility];  Service: Orthopedics;  Laterality: Left;  Excision Lipoma Dorsum Left Wrist     Family History:  Family History  Problem Relation Age of Onset  . Congestive Heart Failure Father   . Congestive Heart Failure Sister   . Alzheimer's disease Mother   . Diabetes Brother   . Arthritis Brother     Social History:  reports that she has never smoked. She has never used smokeless tobacco. She reports that she does not drink alcohol or use illicit drugs.  Additional Social History:     CIWA: CIWA-Ar BP: 124/66 mmHg Pulse Rate: 109 COWS:    Allergies:  Allergies  Allergen Reactions  . Sulfa  Drugs Cross Reactors Nausea And Vomiting    Home Medications:  (Not in a hospital admission)  OB/GYN Status:  No LMP recorded. Patient has had a hysterectomy.  General Assessment Data [facility] of [facility]: [facility] ACT Assessment: Yes Is this a Tele or Face-to-Face Assessment?: Tele Assessment Is this an Initial Assessment or a Re-assessment for this encounter?: Initial Assessment Living Arrangements: Alone Can pt return to current living arrangement?: Yes Admission Status: Voluntary Is patient capable of signing voluntary admission?: Yes Transfer from: Home Referral Source: Self/Family/Friend  Medical Screening Exam ([place]) Medical Exam completed: Yes  [place] Living Arrangements: Alone Name of Psychiatrist: N/A Name of Therapist: N/A  Education Status Is patient currently in school?: No Current Grade: N/A Highest grade of school patient has completed: N/A Name of school: N/A Contact person: N/A  Risk to self with the past 6 months Suicidal Ideation: No Suicidal Intent: No Is patient at risk for suicide?: No Suicidal Plan?: No Access to Means: No What has been your use of drugs/alcohol within the last 12 months?: None Previous Attempts/Gestures: No How many times?: 0 Other Self Harm Risks: No Triggers for Past Attempts: None known Intentional Self Injurious Behavior: None Family Suicide History: Unknown Recent stressful life event(s): Loss (Comment), Other (Comment) (Grieving Spouse death from 5 yrs ago, recent antidepressant ) Persecutory voices/beliefs?: No Depression: Yes Depression Symptoms: Insomnia, Tearfulness, Loss of interest in usual pleasures, Feeling worthless/self pity, Fatigue Substance abuse history and/or treatment for substance abuse?: No Suicide prevention information given to non-admitted patients: Not applicable  Risk to Others within the past 6 months Homicidal Ideation: No Thoughts of Harm to Others: No Current  Homicidal Intent: No Current Homicidal Plan: No Access to Homicidal Means: No Identified Victim: N/A History of harm to others?: No Assessment of Violence: None Noted Violent Behavior Description: None Does patient have access to weapons?: No Criminal Charges Pending?: No Does patient have a court date: No  Psychosis Hallucinations: None noted Delusions: None noted  Mental Status Report Appear/Hygiene: In hospital gown Eye Contact: Fair Motor Activity: Restlessness Speech: Logical/coherent Level of Consciousness: Alert Mood: Sad, Depressed Affect: Depressed, Sad Anxiety Level: Moderate Thought Processes: Relevant, Coherent Judgement: Partial Orientation: Person, Place, Time, Situation Obsessive Compulsive Thoughts/Behaviors: None  Cognitive Functioning Concentration: Decreased Memory: Recent Intact, Remote Intact IQ: Average Insight: Good Impulse Control: Good Appetite: Poor Weight Loss: 6 Weight Gain: 0 Sleep: Decreased Total Hours of Sleep: 3 Vegetative Symptoms: None  ADLScreening [facility] Assessment Services) Patient's cognitive ability adequate to safely complete daily activities?: Yes Patient able to express need for assistance with ADLs?: Yes Independently performs ADLs?: Yes (appropriate for developmental age)  Prior Inpatient Therapy Prior Inpatient Therapy: Yes Prior Therapy Dates: 1996 Prior Therapy Facilty/Provider(s): [facility] Reason for Treatment: Depression  Prior Outpatient Therapy Prior Outpatient Therapy: No Prior Therapy Dates: N/A Prior Therapy Facilty/Provider(s): N/A Reason for Treatment: N/A  ADL Screening (condition at time of admission) Patient's cognitive ability adequate to safely complete daily activities?: Yes Is the patient deaf or have difficulty hearing?: No Does the patient have difficulty seeing, even when wearing glasses/contacts?: No Does the patient have difficulty concentrating, remembering, or making decisions?:  No Patient able to express need for assistance with ADLs?: Yes Does the patient have difficulty dressing or bathing?: No Independently performs ADLs?: Yes (appropriate for developmental age) Does the patient have difficulty walking or climbing stairs?: No Weakness of Legs: None Weakness of Arms/Hands: None  Home Assistive Devices/Equipment Home Assistive Devices/Equipment: None    Abuse/Neglect Assessment (Assessment to be complete while patient is alone) Physical Abuse: Denies Verbal Abuse: Denies Sexual Abuse: Denies Exploitation of patient/patient's resources: Denies Self-Neglect: Denies     Regulatory affairs officer (For Healthcare) Type of Advance Directive: Press photographer    Additional Information 1:1 In Past 12 Months?: No CIRT Risk: No Elopement Risk: No Does patient have medical clearance?: Yes     Disposition:  Disposition Initial Assessment Completed for this Encounter: Yes Disposition of Patient: Outpatient treatment Type of outpatient treatment: Adult  On Site Evaluation by:   Reviewed with Physician:    Dey-Johnson,Daryus Sowash 08/07/2014 8:47 AM

## 2014-08-07 NOTE — Consult Note (Signed)
[facility] Consult   Reason for Consult:  Major depression Referring Physician:  EDP Patient Identification: [name] MRN:  [id number] Principal Diagnosis: Major depressive disorder, recurrent severe without psychotic features Diagnosis:   Patient Active Problem List   Diagnosis Date Noted  . Major depressive disorder, recurrent severe without psychotic features [F33.2] 08/07/2014    Priority: High  . Insomnia [G47.00] 08/05/2014  . Hip joint painful on movement [M25.559] 08/05/2014  . Degenerative disc disease, lumbar [M51.36] 08/05/2014  . Severe obesity (BMI >= 40) [E66.01] 01/11/2014  . Hyperlipemia [E78.5]   . Depression [F32.9]   . Arthritis [M19.90]   . Polyneuropathy in other diseases classified elsewhere [G63] 04/08/2013  . Restless legs syndrome (RLS) [G25.81] 04/08/2013  . UTI (lower urinary tract infection) [N39.0] 10/14/2011  . Dizziness [R42] 10/12/2011  . Fall [W19.XXXA] 10/12/2011  . TIA (transient ischemic attack) [G45.9] 10/12/2011  . Cellulitis [L03.90] 10/12/2011  . HTN (hypertension) [I10] 10/12/2011  . GERD (gastroesophageal reflux disease) [K21.9] 10/12/2011    Total Time spent with patient: 45 minutes  Subjective:   [name] is a 74 y.o. female patient admitted with Depression.  HPI:  Caucasian female, 73 years was evaluated this morning for complaints of depression.  Patient stated that she is not able to get her dead husband out her mind since December.  Patient stated that she has been thinking of him and his death to the extent she cannot function.  Patient reported poor sleep and appetite. She reports feeling helpless and hopeless to the extent of lacking motivation to do anything.  Patient reported that she has been canceling doctors appointments and not cleaning her house because she cannot get herself out of bed to do anything.  She denies SI/HI/AVH but admitted to attempting suicide 5 years ago after her husband died.   She does not want to kill herself but reports increased depression.  Collateral information was gotten from her son who stated that his mother may hurt herself again if not taken care of.  He stated that his mother feels hopeless and helpless and refuses to clean her house.  He stated that his mother calls him several times a day telling him how depressed and sad she feels.   He stated that his mother woke him up 3 am and asked him to bring her to the hospital.  We have accepted her for admission and will be looking for admission in a Psychiatric hospital with available bed.  HPI Elements:   Location:  Major depression. Quality:  Feelings of hopelessness, helplessness, insomnia, poor appetitre, lack of motivation.. Severity:  severe. Timing:  Acute. Duration:  For about 5 years, now getting worse. Context:  Seeking treatment for depression.  Past Medical History:  Past Medical History  Diagnosis Date  . Hypertension   . GERD (gastroesophageal reflux disease)     hx pud '98  . Arthritis     djd  . Restless leg syndrome   . 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 Dysrhythmia     hx tachy palpitations  . Headache(784.0)     remote hx of migraines  . Neuromuscular disorder     peripheral neuropathy, FEET AND LEGS  . Depression     bh admission '09  . Stroke     ? 6 months ago - tests okay - [facility]   . Cellulitis     10/11/11 hospitalized for cellulitis   .  Hyperlipemia   . Peripheral neuropathy   . Elbow fracture, left April 2015    Past Surgical History  Procedure Laterality Date  . Cervical disc surgery x2  2011  . Rt carotid enarterectomy  2011  . Rt knee arthroscopy  '99  . Left knee arthroscopy  2001  . Hematoma evacuation  2011    s/p rt cea  . Joint replacement  '01    total knee replacement, LEFT  . Abdominal hysterectomy  1975    bil oophorectomy  . Back surgery   MANY YRS AGO    laminectomy x3  . Cholecystectomy  1993  . Total knee arthroplasty   09/13/2011    Procedure: TOTAL KNEE ARTHROPLASTY;  Surgeon: Magnus Sinning, MD;  [facility];  Service: Orthopedics;  Laterality: Right;  . Right knee replacement      09/2011   . Carpal tunnel release  07/09/2012    Procedure: CARPAL TUNNEL RELEASE;  Surgeon: Magnus Sinning, MD;  [facility];  Service: Orthopedics;  Laterality: Left;  . Finger arthroplasty  07/09/2012    Procedure: FINGER ARTHROPLASTY;  Surgeon: Magnus Sinning, MD;  [facility];  Service: Orthopedics;  Laterality: Left;  Interposition Arthroplasty [facility] Joint Thumb Left   . Lipoma excision  07/09/2012    Procedure: EXCISION LIPOMA;  Surgeon: Magnus Sinning, MD;  [facility];  Service: Orthopedics;  Laterality: Left;  Excision Lipoma Dorsum Left Wrist    Family History:  Family History  Problem Relation Age of Onset  . Congestive Heart Failure Father   . Congestive Heart Failure Sister   . Alzheimer's disease Mother   . Diabetes Brother   . Arthritis Brother    Social History:  History  Alcohol Use No     History  Drug Use No    History   Social History  . Marital Status: Widowed    Spouse Name: N/A  . Number of Children: 2  . Years of Education: 62   Social History Main Topics  . Smoking status: Never Smoker   . Smokeless tobacco: Never Used  . Alcohol Use: No  . Drug Use: No  . Sexual Activity: Not on file   Other Topics Concern  . None   Social History Narrative   Patient is widowed and lives alone.   Patient has two sons.   Patient is retired.   Patient has a college education.   Patient is right-handed.   Patient drinks three glasses of Coke-soda daily.         Additional Social History:     Allergies:   Allergies  Allergen Reactions  . Sulfa Drugs Cross Reactors Nausea And Vomiting    Vitals: Blood pressure 124/66, pulse 109, temperature 97.3 F (36.3 C), temperature source Oral, resp. rate 18, SpO2 97 %.  Risk to Self: Suicidal Ideation: No Suicidal  Intent: No Is patient at risk for suicide?: No Suicidal Plan?: No Access to Means: No What has been your use of drugs/alcohol within the last 12 months?: None How many times?: 0 Other Self Harm Risks: No Triggers for Past Attempts: None known Intentional Self Injurious Behavior: None Risk to Others: Homicidal Ideation: No Thoughts of Harm to Others: No Current Homicidal Intent: No Current Homicidal Plan: No Access to Homicidal Means: No Identified Victim: N/A History of harm to others?: No Assessment of Violence: None Noted Violent Behavior Description: None Does patient have access to weapons?: No Criminal Charges Pending?: No  Does patient have a court date: No Prior Inpatient Therapy: Prior Inpatient Therapy: Yes Prior Therapy Dates: 1996 Prior Therapy Facilty/Provider(s): [facility] Reason for Treatment: Depression Prior Outpatient Therapy: Prior Outpatient Therapy: No Prior Therapy Dates: N/A Prior Therapy Facilty/Provider(s): N/A Reason for Treatment: N/A  Current Facility-Administered Medications  Medication Dose Route Frequency Provider Last Rate Last Dose  . acetaminophen (TYLENOL) tablet 650 mg  650 mg Oral Q4H PRN Larene Pickett, PA-C      . alum & mag hydroxide-simeth (MAALOX/MYLANTA) 200-200-20 MG/5ML suspension 30 mL  30 mL Oral PRN Larene Pickett, PA-C      . amLODipine (NORVASC) tablet 10 mg  10 mg Oral Daily Larene Pickett, PA-C   10 mg at 08/07/14 1020  . aspirin tablet 325 mg  325 mg Oral Daily Larene Pickett, PA-C   325 mg at 08/07/14 1018  . atorvastatin (LIPITOR) tablet 20 mg  20 mg Oral q1800 Larene Pickett, PA-C      . calcium carbonate (OS-CAL - dosed in mg of elemental calcium) tablet 1,250 mg  1,250 mg Oral Daily Larene Pickett, PA-C   1,250 mg at 08/07/14 1023  . carbidopa-levodopa (SINEMET CR) 50-200 MG per tablet controlled release 1 tablet  1 tablet Oral QHS Larene Pickett, PA-C      . carbidopa-levodopa (SINEMET IR) 25-250 MG per tablet  immediate release 1 tablet  1 tablet Oral TID Larene Pickett, PA-C   1 tablet at 08/07/14 1021  . cholecalciferol (VITAMIN D) tablet 1,000 Units  1,000 Units Oral Daily Larene Pickett, PA-C   1,000 Units at 08/07/14 1019  . gabapentin (NEURONTIN) capsule 800 mg  800 mg Oral QID Larene Pickett, PA-C   800 mg at 08/07/14 8527  . hydrochlorothiazide (HYDRODIURIL) tablet 25 mg  25 mg Oral Daily Larene Pickett, PA-C   25 mg at 08/07/14 1019  . HYDROcodone-acetaminophen (NORCO/VICODIN) 5-325 MG per tablet 1 tablet  1 tablet Oral Q6H PRN Larene Pickett, PA-C      . ibuprofen (ADVIL,MOTRIN) tablet 600 mg  600 mg Oral Q8H PRN Larene Pickett, PA-C      . LORazepam (ATIVAN) tablet 1 mg  1 mg Oral Q8H PRN Larene Pickett, PA-C      . meclizine (ANTIVERT) tablet 25 mg  25 mg Oral BID PRN Larene Pickett, PA-C      . mirtazapine (REMERON) tablet 15 mg  15 mg Oral QHS Larene Pickett, PA-C      . ondansetron [facility]) tablet 4 mg  4 mg Oral Q8H PRN Larene Pickett, PA-C      . pyridOXINE (VITAMIN B-6) tablet 50 mg  50 mg Oral Daily Larene Pickett, PA-C   50 mg at 08/07/14 1018  . SUMAtriptan (IMITREX) tablet 25 mg  25 mg Oral PRN Larene Pickett, PA-C      . Suvorexant TABS 10 mg  10 mg Oral QHS Larene Pickett, PA-C      . Vortioxetine HBr (BRINTELLIX) 20 MG tablet 20 mg  20 mg Oral Daily Larene Pickett, PA-C   20 mg at 08/07/14 1024  . zolpidem (AMBIEN) tablet 5 mg  5 mg Oral QHS PRN Larene Pickett, PA-C       Current Outpatient Prescriptions  Medication Sig Dispense Refill  . amLODipine (NORVASC) 10 MG tablet Take 1 tablet by mouth at  bedtime 90 tablet 1  . aspirin 325 MG tablet  Take 325 mg by mouth daily.    Marland Kitchen atorvastatin (LIPITOR) 20 MG tablet Take 1 tablet by mouth at  bedtime 90 tablet 1  . calcium carbonate 1250 MG capsule Take 1,250 mg by mouth daily.     . carbidopa-levodopa (SINEMET CR) 50-200 MG per tablet Take 1 tablet by mouth at bedtime. 90 tablet 3  . carbidopa-levodopa (SINEMET IR) 25-250 MG per  tablet Take 1 tablet by mouth 3  times a day 270 tablet 0  . cholecalciferol (VITAMIN D) 1000 UNITS tablet Take 1,000 Units by mouth daily.    Marland Kitchen gabapentin (NEURONTIN) 800 MG tablet Take 1 tablet by mouth 4  times daily 360 tablet 1  . hydrochlorothiazide (HYDRODIURIL) 25 MG tablet Take 1 tablet by mouth in  the morning 90 tablet 1  . HYDROcodone-acetaminophen (NORCO/VICODIN) 5-325 MG per tablet Take 1 tablet by mouth every 6 (six) hours as needed (MUST LAST 28 DAYS). 10 tablet 0  . mirtazapine (REMERON) 15 MG tablet Take one tablet by mouth every night at bedtime 90 tablet 3  . pyridOXINE (VITAMIN B-6) 50 MG tablet Take 50 mg by mouth daily.    . SUMAtriptan (IMITREX) 50 MG tablet See AUX Label (Patient taking differently: TAKE ONE TABLET AS NEEDED FOR MIGRAINES) 30 tablet 1  . Vortioxetine HBr (BRINTELLIX) 20 MG TABS Take 20 mg by mouth daily. 90 tablet 1  . acetaminophen (TYLENOL) 500 MG tablet Take 500 mg by mouth every 6 (six) hours as needed. Pain    . amoxicillin (AMOXIL) 500 MG capsule Take 500 mg by mouth. Take 4 pills 1 hour prior to dentist appointment    . meclizine (ANTIVERT) 25 MG tablet Take one tablet by mouth once daily as needed for dizziness. (Patient taking differently: Take 25 mg by mouth 2 (two) times daily as needed for dizziness. ) 30 tablet 0  . nystatin (MYCOSTATIN/NYSTOP) 100000 UNIT/GM POWD Apply beneath left breast for yeast rash 15 g 1  . Suvorexant (BELSOMRA) 10 MG TABS Take 10 mg by mouth at bedtime. For sleep 30 tablet 3  . [DISCONTINUED] potassium chloride 20 MEQ TBCR Take 20 mEq by mouth daily. 3 tablet 0    Musculoskeletal: Strength & Muscle Tone: within normal limits Gait & Station: normal Patient leans: N/A  Psychiatric Specialty Exam:     Blood pressure 124/66, pulse 109, temperature 97.3 F (36.3 C), temperature source Oral, resp. rate 18, SpO2 97 %.There is no weight on file to calculate BMI.  General Appearance: Casual  Eye Contact::  Good   Speech:  Clear and Coherent and Normal Rate  Volume:  Normal  Mood:  Anxious, Depressed, Hopeless and helpless  Affect:  Congruent, Depressed, Flat and Tearful  Thought Process:  Coherent, Goal Directed and Intact  Orientation:  Full (Time, Place, and Person)  Thought Content:  WDL  Suicidal Thoughts:  No  Homicidal Thoughts:  No  Memory:  Immediate;   Good Recent;   Good Remote;   Good  Judgement:  Good  Insight:  Good  Psychomotor Activity:  Normal  Concentration:  Good  Recall:  NA  Fund of Knowledge:Good  Language: Good  Akathisia:  NA  Handed:  Right  AIMS (if indicated):     Assets:  Desire for Improvement  ADL's:  Intact  Cognition: WNL  Sleep:      Medical Decision Making: Established Problem, Worsening (2)  Treatment Plan Summary: Daily contact with patient to assess and evaluate symptoms and progress in treatment,  Medication management and Plan Admitted and [facility]  Plan:  Recommend psychiatric Inpatient admission when medically cleared. Disposition: [facility]   PMHNP-BC 08/07/2014 10:26 AM I have personally seen the patient and agreed with the findings and involved in the treatment plan. Berniece Andreas, MD

## 2014-08-07 NOTE — ED Notes (Signed)
Sons bedside and both have been wanded by security

## 2014-08-07 NOTE — ED Notes (Signed)
Reginold Agent, NP bedside with pt and family

## 2014-08-07 NOTE — ED Notes (Signed)
Family took home pt belongings. Family member Butch Braswell left phone number [phone].

## 2014-08-07 NOTE — BH Assessment (Addendum)
0730:  Consulted with PA-C Sanders about the Patient.  PA-C Baird Cancer reports the Patient is grieving the 61th anniversary of her Spouse's death and have been experiencing depressive symptoms since Christmas 2015.  She reports the Patient has not been bathing, missing doctor's appointments, not cleaning her home, and lives alone.  PA-C Baird Cancer reports the Patient is denying SI and HI.   [name] reports the Patient's Son is at bedside.  0732:  Spoke with Apolonio Schneiders to schedule tele-assessment.  Apolonio Schneiders reports the Patient is in an area where the tele-assessment machine will not work.  She reports the Patient will be transferred in 30 minutes to TCU where the tele-assessment machine will work.      0750:  Scheduled tele-assessment.  2820:  Completed tele-assessment.  6015:  Consulted with Catalina Pizza, NP/Extender.  Per Catalina Pizza:  Patient does not meet inpatient criteria provide outpatient resources.    0822:  Provided PA-C Sanders with Patient's disposition.    0830:  Faxed outpatient resources to [place] for the Patient.

## 2014-08-07 NOTE — ED Notes (Addendum)
Pt reports since christmas last year she has been depressed. Pt states this is the 5th anniversary of her husbands death and is having difficulty coping. Pt is on antidepressants. Pt reports she fell and hurt her back has MRI scheduled for Wed. Pt denies SI/HI. Pt reports she has had no energy to complete everyday task such as cleaning home, going to doctors appointments, etc.

## 2014-08-07 NOTE — ED Notes (Addendum)
Pt became teary when asked to change into scrubs. PA and charge aware. Son at bedside.

## 2014-08-07 NOTE — ED Notes (Signed)
Pt unable to void at this time. 

## 2014-08-07 NOTE — ED Notes (Signed)
Phone call complete with Son. Reported he was in en route to pick up mother. Reported concerns regarding patient's house being dirty, stating "there are dishes on the bed and feces on the floor."  Rn advised son that support system should help clean house.  Karlene Einstein, [facility] made aware of son's concerns and reported that pt stated that son was going to assist with cleaning home.  Officer Forrest reported she would give address and pt info to code enforcement who will go out and assess conditions of home.

## 2014-08-07 NOTE — ED Notes (Addendum)
Phone call received from [place], [facility] Counselor/Therapist. Reported that pt does not meet criteria for inpatient depression treatment.  Pt will be discharged home and provided with resources for outpatient treatment.  PA made aware.

## 2014-08-07 NOTE — ED Notes (Signed)
Dr Marchia Bond, Psychiatrist and Reginold Agent, NP bedside with pt.

## 2014-08-07 NOTE — ED Notes (Signed)
Pt. C/o anxiety. 

## 2014-08-07 NOTE — BH Assessment (Signed)
Per Dr. Adele Schilder and Reginold Agent, NP patient meets inpatient criteria. Patient needs Gero-psych unit therefore not appropriate at [facility]. TTS will seek placement. Lattie Haw, [place] provided with updates.    Rigoberto Noel, MSW, LCSW Triage Specialist [phone],

## 2014-08-07 NOTE — ED Notes (Signed)
Faxed received from [facility], [facility] counselor with resources for mental health intensive outpatient therapy program, outpatient psychiatry and counseling providers, and grief counseling support.  Confirmed receipt of fax with Ms Karlene Einstein.  Rn will give information to patient prior to discharge.

## 2014-08-07 NOTE — ED Notes (Signed)
Contact info for son, [name]- cell [phone], home- [phone].

## 2014-08-07 NOTE — ED Provider Notes (Signed)
CSN: [id number]     Arrival date & time 08/07/14  0515 History   First MD Initiated Contact with Patient 08/07/14 [phone]     Chief Complaint  Patient presents with  . Depression     (Consider location/radiation/quality/duration/timing/severity/associated sxs/prior Treatment) The history is provided by the patient and medical records.    [name] is a 74 y/o Caucasian female who presents today with a chief complaint of not feeling like herself. She states that these feelings have been going on since right before Christmas. It is the 5th year of her husband's death and she says she is not able to get him off of her mind. She states that she is not able to get anything done, she does not care what she looks like, she will make appointments and cancel them because she does not feel like going, and she will cook meals only to leave the food on the stove for a week at a time. She says that this is not normal for her. Her PCP recently switched her antidepressant from Prozac to Brintellilx. The patient states that she feels she hasn't been on the new antidepressant long enough to tell if it is working. [name] not able to sleep well, and has a decreased appetite. She felt nauseous this morning after taking her morning meds but has had no episodes of vomiting. She also states that she has had diarrhea for the last 2 days due to food she has eaten. She denies hematochezia or melena. She denies chest pain, shortness of breath, and dyspnea. She says that she has talked to a counselor in the past and it has helped, and she would like to have someone to talk to again.   Denies SI/HI/AVH.  No EtOH or illicit drug use.  Past Medical History  Diagnosis Date  . Hypertension   . GERD (gastroesophageal reflux disease)     hx pud '98  . Arthritis     djd  . Restless leg syndrome   . 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 Dysrhythmia     hx tachy palpitations  . Headache(784.0)     remote hx of  migraines  . Neuromuscular disorder     peripheral neuropathy, FEET AND LEGS  . Depression     bh admission '09  . Stroke     ? 6 months ago - tests okay - [facility]   . Cellulitis     10/11/11 hospitalized for cellulitis   . Hyperlipemia   . Peripheral neuropathy   . Elbow fracture, left April 2015   Past Surgical History  Procedure Laterality Date  . Cervical disc surgery x2  2011  . Rt carotid enarterectomy  2011  . Rt knee arthroscopy  '99  . Left knee arthroscopy  2001  . Hematoma evacuation  2011    s/p rt cea  . Joint replacement  '01    total knee replacement, LEFT  . Abdominal hysterectomy  1975    bil oophorectomy  . Back surgery   MANY YRS AGO    laminectomy x3  . Cholecystectomy  1993  . Total knee arthroplasty  09/13/2011    Procedure: TOTAL KNEE ARTHROPLASTY;  Surgeon: Magnus Sinning, MD;  Location: [facility];  Service: Orthopedics;  Laterality: Right;  . Right knee replacement      09/2011   . Carpal tunnel release  07/09/2012    Procedure: CARPAL TUNNEL RELEASE;  Surgeon:  Magnus Sinning, MD;  Location: [facility];  Service: Orthopedics;  Laterality: Left;  . Finger arthroplasty  07/09/2012    Procedure: FINGER ARTHROPLASTY;  Surgeon: Magnus Sinning, MD;  Location: [facility];  Service: Orthopedics;  Laterality: Left;  Interposition Arthroplasty [facility] Joint Thumb Left   . Lipoma excision  07/09/2012    Procedure: EXCISION LIPOMA;  Surgeon: Magnus Sinning, MD;  Location: [facility];  Service: Orthopedics;  Laterality: Left;  Excision Lipoma Dorsum Left Wrist    Family History  Problem Relation Age of Onset  . Congestive Heart Failure Father   . Congestive Heart Failure Sister   . Alzheimer's disease Mother   . Diabetes Brother   . Arthritis Brother    History  Substance Use Topics  . Smoking status: Never Smoker   . Smokeless tobacco: Never Used  . Alcohol Use: No   OB History    No data available     Review of Systems  Psychiatric/Behavioral:       Depression   All other systems reviewed and are negative.     Allergies  Sulfa drugs cross reactors  Home Medications   Prior to Admission medications   Medication Sig Start Date End Date Taking? Authorizing Provider  amLODipine (NORVASC) 10 MG tablet Take 1 tablet by mouth at  bedtime 07/20/14  Yes [name] L [name], DO  aspirin 325 MG tablet Take 325 mg by mouth daily.   Yes Historical Provider, MD  atorvastatin (LIPITOR) 20 MG tablet Take 1 tablet by mouth at  bedtime 06/21/14  Yes [name] L [name], DO  calcium carbonate 1250 MG capsule Take 1,250 mg by mouth daily.    Yes Historical Provider, MD  carbidopa-levodopa (SINEMET CR) 50-200 MG per tablet Take 1 tablet by mouth at bedtime. 06/10/14  Yes [name] L [name], DO  carbidopa-levodopa (SINEMET IR) 25-250 MG per tablet Take 1 tablet by mouth 3  times a day 06/07/14  Yes Lauree Chandler, NP  cholecalciferol (VITAMIN D) 1000 UNITS tablet Take 1,000 Units by mouth daily.   Yes Historical Provider, MD  gabapentin (NEURONTIN) 800 MG tablet Take 1 tablet by mouth 4  times daily 07/31/14  Yes Kathrynn Ducking, MD  hydrochlorothiazide (HYDRODIURIL) 25 MG tablet Take 1 tablet by mouth in  the morning 07/20/14  Yes [name] L [name], DO  HYDROcodone-acetaminophen (NORCO/VICODIN) 5-325 MG per tablet Take 1 tablet by mouth every 6 (six) hours as needed (MUST LAST 28 DAYS). 08/01/14  Yes Jasper Riling. Pickering, MD  mirtazapine (REMERON) 15 MG tablet Take one tablet by mouth every night at bedtime 06/10/14  Yes [name] L [name], DO  pyridOXINE (VITAMIN B-6) 50 MG tablet Take 50 mg by mouth daily.   Yes Historical Provider, MD  SUMAtriptan (IMITREX) 50 MG tablet See AUX Label Patient taking differently: TAKE ONE TABLET AS NEEDED FOR MIGRAINES 07/20/14  Yes [name] L [name], DO  Vortioxetine HBr (BRINTELLIX) 20 MG TABS Take 20 mg by mouth daily. 08/05/14  Yes [name] L [name], DO  acetaminophen (TYLENOL) 500 MG tablet Take 500 mg by mouth every 6 (six) hours as needed. Pain    Historical  Provider, MD  amoxicillin (AMOXIL) 500 MG capsule Take 500 mg by mouth. Take 4 pills 1 hour prior to dentist appointment    Historical Provider, MD  meclizine (ANTIVERT) 25 MG tablet Take one tablet by mouth once daily as needed for dizziness. Patient taking differently: Take 25 mg by mouth 2 (two) times daily as  needed for dizziness.  05/26/14   [name] L [name], DO  nystatin (MYCOSTATIN/NYSTOP) 100000 UNIT/GM POWD Apply beneath left breast for yeast rash 01/11/14   [name] L [name], DO  Suvorexant (BELSOMRA) 10 MG TABS Take 10 mg by mouth at bedtime. For sleep 08/06/14   [name] L [name], DO   BP 150/74 mmHg  Pulse 114  Temp(Src) 98.4 F (36.9 C) (Oral)  Resp 15  SpO2 97%   Physical Exam  Constitutional: [name] oriented to person, place, and time. She appears well-developed and well-nourished.  HENT:  Head: Normocephalic and atraumatic.  Mouth/Throat: Oropharynx is clear and moist.  Eyes: Conjunctivae and EOM are normal. Pupils are equal, round, and reactive to light.  Neck: Normal range of motion.  Cardiovascular: Normal rate, regular rhythm and normal heart sounds.   Pulmonary/Chest: Effort normal and breath sounds normal.  Abdominal: Soft. Bowel sounds are normal.  Musculoskeletal: Normal range of motion.  Neurological: [name] alert and oriented to person, place, and time.  Skin: Skin is warm and dry.  Psychiatric: [name] not actively hallucinating. She exhibits a depressed mood. She expresses no homicidal and no suicidal ideation. She expresses no suicidal plans and no homicidal plans.  Appears depressed, very tearful; denies SI/HI/AVH  Nursing note and vitals reviewed.   ED Course  Procedures (including critical care time) Labs Review Labs Reviewed  COMPREHENSIVE METABOLIC PANEL - Abnormal; Notable for the following:    Sodium 132 (*)    Potassium 2.5 (*)    Chloride 90 (*)    Glucose, Bld 201 (*)    Creatinine, Ser 1.12 (*)    GFR calc non Af Amer 48 (*)    GFR calc Af Amer  55 (*)    Anion gap 16 (*)    All other components within normal limits  CBC WITH DIFFERENTIAL/PLATELET  ETHANOL  URINE RAPID DRUG SCREEN (HOSP PERFORMED)    Imaging Review No results found.   EKG Interpretation None      MDM   Final diagnoses:  Depression  Hypokalemia    74 year old female here with depression on the fifth anniversary of her husband's death. She has had a steady decline in her level of functioning due to her depression.  On exam, patient does appear depressed and is extremely tearful.  Denies SI/HI/AVH.  Lab work as above-- hypokalemia noted, was replaced in ED.  Patient medically cleared and awaiting TTS evaluation. Temp holding orders and home meds placed.  8:21 AM [facility] has assessed patient-- Does not meet inpatient criteria at this time as she is not SI/HI.  Recommend OP resources, likely benefit from support group.  Patient's hypokalemia was somewhat replaced here, will need continued supplementation for the next few days which i have written.  Patient given OP psych resources.  Encouraged to FU with PCP for re-check of potassium.  Discussed plan with patient, he/she acknowledged understanding and agreed with plan of care.  Return precautions given for new or worsening symptoms-- specifically if develops IS/HI/AVH.  10:18 AM Dr. Adele Schilder has talked with patient's son about his continued concerns.  Have decided to seek placement at geri-psych facility.  Isabella Pickett, PA-C 08/07/14 [place], MD 08/08/14 [phone]

## 2014-08-08 DIAGNOSIS — F332 Major depressive disorder, recurrent severe without psychotic features: Secondary | ICD-10-CM

## 2014-08-08 LAB — BASIC METABOLIC PANEL
Anion gap: 10 (ref 5–15)
BUN: 16 mg/dL (ref 6–23)
CALCIUM: 9.5 mg/dL (ref 8.4–10.5)
CHLORIDE: 95 mmol/L — AB (ref 96–112)
CO2: 27 mmol/L (ref 19–32)
CREATININE: 1.05 mg/dL (ref 0.50–1.10)
GFR calc non Af Amer: 52 mL/min — ABNORMAL LOW (ref >90)
GFR, EST AFRICAN AMERICAN: 60 mL/min — AB (ref >90)
GLUCOSE: 199 mg/dL — AB (ref 70–99)
Potassium: 2.6 mmol/L — CL (ref 3.5–5.1)
Sodium: 132 mmol/L — ABNORMAL LOW (ref 135–145)

## 2014-08-08 LAB — POTASSIUM: Potassium: 3.4 mmol/L — ABNORMAL LOW (ref 3.5–5.1)

## 2014-08-08 MED ORDER — SALINE SPRAY 0.65 % NA SOLN
1.0000 | Freq: Once | NASAL | Status: DC
  Filled 2014-08-08: qty 44

## 2014-08-08 MED ORDER — TRAZODONE HCL 50 MG PO TABS
50.0000 mg | ORAL_TABLET | Freq: Every evening | ORAL | Status: DC | PRN
  Administered 2014-08-08 – 2014-08-10 (×2): 50 mg via ORAL
  Filled 2014-08-08 (×2): qty 1

## 2014-08-08 MED ORDER — VORTIOXETINE HBR 20 MG PO TABS
20.0000 mg | ORAL_TABLET | Freq: Every day | ORAL | Status: DC
  Administered 2014-08-09 – 2014-08-10 (×2): 20 mg via ORAL
  Filled 2014-08-08 (×2): qty 20

## 2014-08-08 MED ORDER — SALINE SPRAY 0.65 % NA SOLN
1.0000 | NASAL | Status: DC | PRN
  Administered 2014-08-08 – 2014-08-10 (×6): 1 via NASAL
  Filled 2014-08-08: qty 44

## 2014-08-08 MED ORDER — POTASSIUM CHLORIDE CRYS ER 20 MEQ PO TBCR
60.0000 meq | EXTENDED_RELEASE_TABLET | Freq: Once | ORAL | Status: AC
  Administered 2014-08-08: 60 meq via ORAL
  Filled 2014-08-08: qty 3

## 2014-08-08 MED ORDER — VORTIOXETINE HBR 20 MG PO TABS: 40.0000 mg | ORAL_TABLET | Freq: Every day | ORAL | Status: DC

## 2014-08-08 NOTE — ED Notes (Signed)
Lab in to draw

## 2014-08-08 NOTE — BH Assessment (Signed)
[place] Assessment Progress Note   Received call from [place] at [facility] at 1728 stating pt declined there due to their unit acuity.  Shaune Pascal, [facility]

## 2014-08-08 NOTE — ED Notes (Signed)
Pt requestig ativan tohelp her relax, PO fluids encouraged. Pt reports that she has not been eatting/drinking well for the past few days.  She also reports that she has a hx of low potassium levels requiring IV replacement.

## 2014-08-08 NOTE — ED Notes (Addendum)
Report received from Janie Rambo RN. Pt. Sleeping, respirations regular and unlabored. Will continue to monitor for safety via security cameras and Q 15 minute checks. 

## 2014-08-08 NOTE — ED Notes (Signed)
Up to the bathroom 

## 2014-08-08 NOTE — ED Notes (Signed)
Lab contacted about blood draw and will be here shortly

## 2014-08-08 NOTE — BH Assessment (Signed)
[place] Assessment Progress Note   The following facilities were contacted in an attempt to place the pt:  BED AVAILABLE, FAXED CLINICAL INFORMATION: [facility], per Tammy, pt referral still under review at 31 OV-one female gero bed per Helene Kelp, referral faxed for review because discharges expected tomorrow  AT CAPACITY: [facility], per [facility], per Fanny Skates  NO RESPONSE: [facility]-left message at [phone] - referral faxed for review [facility] message at [phone] - referral faxed for review [facility]-left message at [phone] - referral faxed for review  TTS will continue to seek placement for the pt.  Shaune Pascal, MS, [facility] Licensed Professional Counselor Therapeutic Triage Specialist [facility] Phone: [phone] Fax: [phone]

## 2014-08-08 NOTE — ED Notes (Signed)
Soda & crackers given

## 2014-08-08 NOTE — ED Notes (Signed)
Up on the phone 

## 2014-08-08 NOTE — BH Assessment (Signed)
Isabella Downs, NP recommends geriatric-psychiatry unit. Contacted the following facilities:  BED AVAILABLE, FAXED CLINICAL INFORMATION: [facility], per [place]: [facility], per [place], per [facility], per [place], per [facility], per [facility], per [facility], per Kalman Shan  NO RESPONSE: [place], [place], [facility] Triage Specialist [phone]

## 2014-08-08 NOTE — Consult Note (Signed)
[facility] Face-to-Face Psychiatry Follow Up Note   Patient Identification: [name] MRN:  [id number] Principal Diagnosis: Major depressive disorder, recurrent severe without psychotic features Diagnosis:   Patient Active Problem List   Diagnosis Date Noted  . Major depressive disorder, recurrent severe without psychotic features [F33.2] 08/07/2014  . Insomnia [G47.00] 08/05/2014  . Hip joint painful on movement [M25.559] 08/05/2014  . Degenerative disc disease, lumbar [M51.36] 08/05/2014  . Severe obesity (BMI >= 40) [E66.01] 01/11/2014  . Hyperlipemia [E78.5]   . Depression [F32.9]   . Arthritis [M19.90]   . Polyneuropathy in other diseases classified elsewhere [G63] 04/08/2013  . Restless legs syndrome (RLS) [G25.81] 04/08/2013  . UTI (lower urinary tract infection) [N39.0] 10/14/2011  . Dizziness [R42] 10/12/2011  . Fall [W19.XXXA] 10/12/2011  . TIA (transient ischemic attack) [G45.9] 10/12/2011  . Cellulitis [L03.90] 10/12/2011  . HTN (hypertension) [I10] 10/12/2011  . GERD (gastroesophageal reflux disease) [K21.9] 10/12/2011    Total Time spent with patient: 45 minutes  Subjective:   [name] is a 74 y.o. female patient admitted with Depression.  HPI:  Caucasian female, 73 years was evaluated this morning for complaints of depression.  Patient stated that she is not able to get her dead husband out her mind since December.  Patient stated that she has been thinking of him and his death to the extent she cannot function.  Patient reported poor sleep and appetite. She reports feeling helpless and hopeless to the extent of lacking motivation to do anything.  Patient reported that she has been canceling doctors appointments and not cleaning her house because she cannot get herself out of bed to do anything.  She denies SI/HI/AVH but admitted to attempting suicide 5 years ago after her husband died.  She does not want to kill herself but reports increased depression.   Collateral information was gotten from her son who stated that his mother may hurt herself again if not taken care of.  He stated that his mother feels hopeless and helpless and refuses to clean her house.  He stated that his mother calls him several times a day telling him how depressed and sad she feels.   He stated that his mother woke him up 3 am and asked him to bring her to the hospital.  We have accepted her for admission and will be looking for admission in a Psychiatric hospital with available bed.  08/08/14: Patient seen chart reviewed.  Patient remains very depressed and tearful and hopeless.  She reported poor sleep and racing thoughts.  She does not see any improvement in her depression.  She started Brintellex 20 mg 3 weeks ago by her primary care physician.  She was taking Prozac and it was switched because it was not working.  [name] also taking Remeron along with Belsorma and she does not see any improvement in her sleep.  She still have negative thinking with feeling of hopelessness and worthlessness.  She continued to endorse passive suicidal thoughts.  I had talked to her son who has lot of concern about the patient.  She has been calling her son every day because she is very depressed and losing the hope.  We are in process of getting beds as patient will require inpatient services.  Past Medical History:  Past Medical History  Diagnosis Date  . Hypertension   . GERD (gastroesophageal reflux disease)     hx pud '98  . Arthritis     djd  . Restless  leg syndrome   . 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 Dysrhythmia     hx tachy palpitations  . Headache(784.0)     remote hx of migraines  . Neuromuscular disorder     peripheral neuropathy, FEET AND LEGS  . Depression     [facility] admission '09  . Stroke     ? 6 months ago - tests okay - [facility]   . Cellulitis     10/11/11 hospitalized for cellulitis   . Hyperlipemia   . Peripheral neuropathy   . Elbow fracture, left  April 2015    Past Surgical History  Procedure Laterality Date  . Cervical disc surgery x2  2011  . Rt carotid enarterectomy  2011  . Rt knee arthroscopy  '99  . Left knee arthroscopy  2001  . Hematoma evacuation  2011    s/p rt cea  . Joint replacement  '01    total knee replacement, LEFT  . Abdominal hysterectomy  1975    bil oophorectomy  . Back surgery   MANY YRS AGO    laminectomy x3  . Cholecystectomy  1993  . Total knee arthroplasty  09/13/2011    Procedure: TOTAL KNEE ARTHROPLASTY;  Surgeon: Magnus Sinning, MD;  Location: [facility];  Service: Orthopedics;  Laterality: Right;  . Right knee replacement      09/2011   . Carpal tunnel release  07/09/2012    Procedure: CARPAL TUNNEL RELEASE;  Surgeon: Magnus Sinning, MD;  Location: [facility];  Service: Orthopedics;  Laterality: Left;  . Finger arthroplasty  07/09/2012    Procedure: FINGER ARTHROPLASTY;  Surgeon: Magnus Sinning, MD;  Location: [facility];  Service: Orthopedics;  Laterality: Left;  Interposition Arthroplasty [facility] Joint Thumb Left   . Lipoma excision  07/09/2012    Procedure: EXCISION LIPOMA;  Surgeon: Magnus Sinning, MD;  Location: [facility];  Service: Orthopedics;  Laterality: Left;  Excision Lipoma Dorsum Left Wrist    Family History:  Family History  Problem Relation Age of Onset  . Congestive Heart Failure Father   . Congestive Heart Failure Sister   . Alzheimer's disease Mother   . Diabetes Brother   . Arthritis Brother    Social History:  History  Alcohol Use No     History  Drug Use No    History   Social History  . Marital Status: Widowed    Spouse Name: N/A  . Number of Children: 2  . Years of Education: 23   Social History Main Topics  . Smoking status: Never Smoker   . Smokeless tobacco: Never Used  . Alcohol Use: No  . Drug Use: No  . Sexual Activity: Not on file   Other Topics Concern  . None   Social History Narrative   Patient is widowed and lives alone.   Patient has two sons.    Patient is retired.   Patient has a college education.   Patient is right-handed.   Patient drinks three glasses of Coke-soda daily.         Additional Social History:     Allergies:   Allergies  Allergen Reactions  . Sulfa Drugs Cross Reactors Nausea And Vomiting    Vitals: Blood pressure 128/54, pulse 104, temperature 98 F (36.7 C), temperature source Oral, resp. rate 18, SpO2 97 %.   Current Facility-Administered Medications  Medication Dose Route Frequency Provider Last Rate Last Dose  . acetaminophen (  TYLENOL) tablet 650 mg  650 mg Oral Q4H PRN Larene Pickett, PA-C   650 mg at 08/07/14 1957  . alum & mag hydroxide-simeth (MAALOX/MYLANTA) 200-200-20 MG/5ML suspension 30 mL  30 mL Oral PRN Larene Pickett, PA-C      . amLODipine (NORVASC) tablet 10 mg  10 mg Oral Daily Larene Pickett, PA-C   10 mg at 08/08/14 1020  . aspirin tablet 325 mg  325 mg Oral Daily Larene Pickett, PA-C   325 mg at 08/08/14 1020  . atorvastatin (LIPITOR) tablet 20 mg  20 mg Oral q1800 Larene Pickett, PA-C   20 mg at 08/07/14 1810  . calcium carbonate (OS-CAL - dosed in mg of elemental calcium) tablet 1,250 mg  1,250 mg Oral Daily Larene Pickett, PA-C   1,250 mg at 08/08/14 1020  . carbidopa-levodopa (SINEMET CR) 50-200 MG per tablet controlled release 1 tablet  1 tablet Oral QHS Larene Pickett, PA-C   1 tablet at 08/07/14 2159  . carbidopa-levodopa (SINEMET IR) 25-250 MG per tablet immediate release 1 tablet  1 tablet Oral TID Larene Pickett, PA-C   1 tablet at 08/08/14 7425  . cholecalciferol (VITAMIN D) tablet 1,000 Units  1,000 Units Oral Daily Larene Pickett, PA-C   1,000 Units at 08/08/14 1020  . gabapentin (NEURONTIN) capsule 800 mg  800 mg Oral QID Larene Pickett, PA-C   800 mg at 08/08/14 9563  . hydrochlorothiazide (HYDRODIURIL) tablet 25 mg  25 mg Oral Daily Larene Pickett, PA-C   25 mg at 08/07/14 1019  . HYDROcodone-acetaminophen (NORCO/VICODIN) 5-325 MG per tablet 1 tablet  1 tablet Oral Q6H  PRN Larene Pickett, PA-C   1 tablet at 08/08/14 0505  . ibuprofen (ADVIL,MOTRIN) tablet 600 mg  600 mg Oral Q8H PRN Larene Pickett, PA-C   600 mg at 08/08/14 8756  . LORazepam (ATIVAN) tablet 1 mg  1 mg Oral Q8H PRN Larene Pickett, PA-C   1 mg at 08/07/14 2233  . meclizine (ANTIVERT) tablet 25 mg  25 mg Oral BID PRN Larene Pickett, PA-C      . mirtazapine (REMERON) tablet 15 mg  15 mg Oral QHS Larene Pickett, PA-C   15 mg at 08/07/14 2159  . ondansetron (ZOFRAN) tablet 4 mg  4 mg Oral Q8H PRN Larene Pickett, PA-C      . pyridOXINE (VITAMIN B-6) tablet 50 mg  50 mg Oral Daily Larene Pickett, PA-C   50 mg at 08/08/14 1020  . SUMAtriptan (IMITREX) tablet 25 mg  25 mg Oral PRN Larene Pickett, PA-C      . [START ON 08/09/2014] Vortioxetine HBr (BRINTELLIX) 20 MG tablet 40 mg  40 mg Oral Daily Kathlee Nations, MD       Current Outpatient Prescriptions  Medication Sig Dispense Refill  . amLODipine (NORVASC) 10 MG tablet Take 1 tablet by mouth at  bedtime 90 tablet 1  . aspirin 325 MG tablet Take 325 mg by mouth daily.    Marland Kitchen atorvastatin (LIPITOR) 20 MG tablet Take 1 tablet by mouth at  bedtime 90 tablet 1  . calcium carbonate 1250 MG capsule Take 1,250 mg by mouth daily.     . carbidopa-levodopa (SINEMET CR) 50-200 MG per tablet Take 1 tablet by mouth at bedtime. 90 tablet 3  . carbidopa-levodopa (SINEMET IR) 25-250 MG per tablet Take 1 tablet by mouth 3  times a day 270 tablet  0  . cholecalciferol (VITAMIN D) 1000 UNITS tablet Take 1,000 Units by mouth daily.    Marland Kitchen gabapentin (NEURONTIN) 800 MG tablet Take 1 tablet by mouth 4  times daily 360 tablet 1  . hydrochlorothiazide (HYDRODIURIL) 25 MG tablet Take 1 tablet by mouth in  the morning 90 tablet 1  . HYDROcodone-acetaminophen (NORCO/VICODIN) 5-325 MG per tablet Take 1 tablet by mouth every 6 (six) hours as needed (MUST LAST 28 DAYS). 10 tablet 0  . mirtazapine (REMERON) 15 MG tablet Take one tablet by mouth every night at bedtime 90 tablet 3  .  pyridOXINE (VITAMIN B-6) 50 MG tablet Take 50 mg by mouth daily.    . SUMAtriptan (IMITREX) 50 MG tablet See AUX Label (Patient taking differently: TAKE ONE TABLET AS NEEDED FOR MIGRAINES) 30 tablet 1  . Vortioxetine HBr (BRINTELLIX) 20 MG TABS Take 20 mg by mouth daily. 90 tablet 1  . acetaminophen (TYLENOL) 500 MG tablet Take 500 mg by mouth every 6 (six) hours as needed. Pain    . amoxicillin (AMOXIL) 500 MG capsule Take 500 mg by mouth. Take 4 pills 1 hour prior to dentist appointment    . meclizine (ANTIVERT) 25 MG tablet Take one tablet by mouth once daily as needed for dizziness. (Patient taking differently: Take 25 mg by mouth 2 (two) times daily as needed for dizziness. ) 30 tablet 0  . nystatin (MYCOSTATIN/NYSTOP) 100000 UNIT/GM POWD Apply beneath left breast for yeast rash 15 g 1  . Suvorexant (BELSOMRA) 10 MG TABS Take 10 mg by mouth at bedtime. For sleep 30 tablet 3  . [DISCONTINUED] potassium chloride 20 MEQ TBCR Take 20 mEq by mouth daily. 3 tablet 0    Musculoskeletal: Strength & Muscle Tone: within normal limits Gait & Station: normal Patient leans: N/A  Psychiatric Specialty Exam:     Blood pressure 128/54, pulse 104, temperature 98 F (36.7 C), temperature source Oral, resp. rate 18, SpO2 97 %.There is no weight on file to calculate BMI.  General Appearance: Casual  Eye Contact::  Good  Speech:  Clear and Coherent and Normal Rate  Volume:  Normal  Mood:  Anxious, Depressed, Hopeless and helpless  Affect:  Congruent, Depressed, Flat and Tearful  Thought Process:  Coherent, Goal Directed and Intact  Orientation:  Full (Time, Place, and Person)  Thought Content:  WDL  Suicidal Thoughts:  Passive suicidal thoughts but no plan  Homicidal Thoughts:  No  Memory:  Immediate;   Good Recent;   Good Remote;   Good  Judgement:  Good  Insight:  Good  Psychomotor Activity:  Normal  Concentration:  Good  Recall:  NA  Fund of Knowledge:Good  Language: Good  Akathisia:  NA   Handed:  Right  AIMS (if indicated):     Assets:  Desire for Improvement  ADL's:  Intact  Cognition: WNL  Sleep:      Medical Decision Making: Established Problem, Worsening (2)  Treatment Plan Summary: Daily contact with patient to assess and evaluate symptoms and progress in treatment, Medication management and Plan Admitted and [facility].  We will discontinue Belsorma as patient is not seeing any improvement.  I would increase Brintellex 40 mg and add trazodone 50 mg for insomnia.  Discussed medication side effects and benefits.  Plan:  Recommend psychiatric Inpatient admission when medically cleared. Disposition: Admit  Adra Shepler T.    08/08/2014 10:43 AM

## 2014-08-09 DIAGNOSIS — F333 Major depressive disorder, recurrent, severe with psychotic symptoms: Secondary | ICD-10-CM | POA: Diagnosis not present

## 2014-08-09 NOTE — Telephone Encounter (Signed)
[name] now in the hospital with worsened depression and apparently took the belsomra along with her remeron according to the psychiatry note.

## 2014-08-09 NOTE — ED Notes (Signed)
Patient is calm, cooperative. Denies SI, HI, AVH at present. Reports feelings of anxiety and depression at 9/10. Reports chronic back pain, improving with Norco.   Encouragement offered.   Q 15 safety checks continue.

## 2014-08-09 NOTE — Progress Notes (Addendum)
Pt referred to:  [facility] 08/09/2014  [facility]    Pt Declined:  [facility]-pt not meeting criteria  [facility]-pt declined due to acuity.  [facility]   At Capacity:  [facility], [place] Work  [employer] [phone]

## 2014-08-09 NOTE — Consult Note (Signed)
[facility] Face-to-Face Psychiatry Follow Up Note   Patient Identification: JADALEE WESTCOTT MRN:  [id number] Principal Diagnosis: Major depressive disorder, recurrent severe without psychotic features Diagnosis:   Patient Active Problem List   Diagnosis Date Noted  . Major depressive disorder, recurrent severe without psychotic features [F33.2] 08/07/2014  . Insomnia [G47.00] 08/05/2014  . Hip joint painful on movement [M25.559] 08/05/2014  . Degenerative disc disease, lumbar [M51.36] 08/05/2014  . Severe obesity (BMI >= 40) [E66.01] 01/11/2014  . Hyperlipemia [E78.5]   . Depression [F32.9]   . Arthritis [M19.90]   . Polyneuropathy in other diseases classified elsewhere [G63] 04/08/2013  . Restless legs syndrome (RLS) [G25.81] 04/08/2013  . UTI (lower urinary tract infection) [N39.0] 10/14/2011  . Dizziness [R42] 10/12/2011  . Fall [W19.XXXA] 10/12/2011  . TIA (transient ischemic attack) [G45.9] 10/12/2011  . Cellulitis [L03.90] 10/12/2011  . HTN (hypertension) [I10] 10/12/2011  . GERD (gastroesophageal reflux disease) [K21.9] 10/12/2011    Total Time spent with patient: 20 minutes  Subjective:   KANEISHA ELLENBERGER is a 74 y.o. female patient admitted with Depression.  HPI:  Caucasian female, 73 years was evaluated this morning for complaints of depression.  Patient stated that she is not able to get her dead husband out her mind since December.  Patient stated that she has been thinking of him and his death to the extent she cannot function.  Patient reported poor sleep and appetite. She reports feeling helpless and hopeless to the extent of lacking motivation to do anything.  Patient reported that she has been canceling doctors appointments and not cleaning her house because she cannot get herself out of bed to do anything.  She denies SI/HI/AVH but admitted to attempting suicide 5 years ago after her husband died.  She does not want to kill herself but reports increased depression.   Collateral information was gotten from her son who stated that his mother may hurt herself again if not taken care of.  He stated that his mother feels hopeless and helpless and refuses to clean her house.  He stated that his mother calls him several times a day telling him how depressed and sad she feels.   He stated that his mother woke him up 3 am and asked him to bring her to the hospital.  We have accepted her for admission and will be looking for admission in a Psychiatric hospital with available bed.  08/08/14: Patient seen chart reviewed.  Patient remains very depressed and tearful and hopeless.  She reported poor sleep and racing thoughts.  She does not see any improvement in her depression.  She started Brintellex 20 mg 3 weeks ago by her primary care physician.  She was taking Prozac and it was switched because it was not working.  [name] also taking Remeron along with Belsorma and she does not see any improvement in her sleep.  She still have negative thinking with feeling of hopelessness and worthlessness.  She continued to endorse passive suicidal thoughts.  I had talked to her son who has lot of concern about the patient.  She has been calling her son every day because she is very depressed and losing the hope.  We are in process of getting beds as patient will require inpatient services.  08/09/14:  Patient evaluated by Dr. Darleene Cleaver and Manus Gunning, NP. Patient continues to endorse depression, insomnia, sadness, decreased motivation. Depression has been worsening since November. She states she is unable to do things around her home even though  she is aware that things need to be cleaned, she just doesn't have desire to clean her home or maintain her personal hygiene.  She states she is grieving the death of her husband and this is the 5th anniversary of his death. She lives alone.   Past Medical History:  Past Medical History  Diagnosis Date  . Hypertension   . GERD (gastroesophageal reflux disease)      hx pud '98  . Arthritis     djd  . Restless leg syndrome   . 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 Dysrhythmia     hx tachy palpitations  . Headache(784.0)     remote hx of migraines  . Neuromuscular disorder     peripheral neuropathy, FEET AND LEGS  . Depression     [facility] admission '09  . Stroke     ? 6 months ago - tests okay - [facility]   . Cellulitis     10/11/11 hospitalized for cellulitis   . Hyperlipemia   . Peripheral neuropathy   . Elbow fracture, left April 2015    Past Surgical History  Procedure Laterality Date  . Cervical disc surgery x2  2011  . Rt carotid enarterectomy  2011  . Rt knee arthroscopy  '99  . Left knee arthroscopy  2001  . Hematoma evacuation  2011    s/p rt cea  . Joint replacement  '01    total knee replacement, LEFT  . Abdominal hysterectomy  1975    bil oophorectomy  . Back surgery   MANY YRS AGO    laminectomy x3  . Cholecystectomy  1993  . Total knee arthroplasty  09/13/2011    Procedure: TOTAL KNEE ARTHROPLASTY;  Surgeon: Magnus Sinning, MD;  Location: [facility];  Service: Orthopedics;  Laterality: Right;  . Right knee replacement      09/2011   . Carpal tunnel release  07/09/2012    Procedure: CARPAL TUNNEL RELEASE;  Surgeon: Magnus Sinning, MD;  Location: [facility];  Service: Orthopedics;  Laterality: Left;  . Finger arthroplasty  07/09/2012    Procedure: FINGER ARTHROPLASTY;  Surgeon: Magnus Sinning, MD;  Location: [facility];  Service: Orthopedics;  Laterality: Left;  Interposition Arthroplasty [facility] Joint Thumb Left   . Lipoma excision  07/09/2012    Procedure: EXCISION LIPOMA;  Surgeon: Magnus Sinning, MD;  Location: [facility];  Service: Orthopedics;  Laterality: Left;  Excision Lipoma Dorsum Left Wrist    Family History:  Family History  Problem Relation Age of Onset  . Congestive Heart Failure Father   . Congestive Heart Failure Sister   . Alzheimer's disease Mother   . Diabetes Brother   . Arthritis Brother    Social  History:  History  Alcohol Use No     History  Drug Use No    History   Social History  . Marital Status: Widowed    Spouse Name: N/A  . Number of Children: 2  . Years of Education: 62   Social History Main Topics  . Smoking status: Never Smoker   . Smokeless tobacco: Never Used  . Alcohol Use: No  . Drug Use: No  . Sexual Activity: Not on file   Other Topics Concern  . None   Social History Narrative   Patient is widowed and lives alone.   Patient has two sons.   Patient is retired.   Patient has a college education.  Patient is right-handed.   Patient drinks three glasses of Coke-soda daily.         Additional Social History:     Allergies:   Allergies  Allergen Reactions  . Sulfa Drugs Cross Reactors Nausea And Vomiting    Vitals: Blood pressure 126/61, pulse 97, temperature 97.8 F (36.6 C), temperature source Oral, resp. rate 16, SpO2 97 %.   Current Facility-Administered Medications  Medication Dose Route Frequency Provider Last Rate Last Dose  . acetaminophen (TYLENOL) tablet 650 mg  650 mg Oral Q4H PRN Larene Pickett, PA-C   650 mg at 08/07/14 1957  . alum & mag hydroxide-simeth (MAALOX/MYLANTA) 200-200-20 MG/5ML suspension 30 mL  30 mL Oral PRN Larene Pickett, PA-C      . amLODipine (NORVASC) tablet 10 mg  10 mg Oral Daily Larene Pickett, PA-C   10 mg at 08/09/14 1443  . aspirin tablet 325 mg  325 mg Oral Daily Larene Pickett, PA-C   325 mg at 08/09/14 0940  . atorvastatin (LIPITOR) tablet 20 mg  20 mg Oral q1800 Larene Pickett, PA-C   20 mg at 08/08/14 1817  . calcium carbonate (OS-CAL - dosed in mg of elemental calcium) tablet 1,250 mg  1,250 mg Oral Daily Larene Pickett, PA-C   1,250 mg at 08/09/14 0940  . carbidopa-levodopa (SINEMET CR) 50-200 MG per tablet controlled release 1 tablet  1 tablet Oral QHS Larene Pickett, PA-C   1 tablet at 08/08/14 2133  . carbidopa-levodopa (SINEMET IR) 25-250 MG per tablet immediate release 1 tablet  1 tablet  Oral TID Larene Pickett, PA-C   1 tablet at 08/09/14 1314  . cholecalciferol (VITAMIN D) tablet 1,000 Units  1,000 Units Oral Daily Larene Pickett, PA-C   1,000 Units at 08/09/14 1540  . gabapentin (NEURONTIN) capsule 800 mg  800 mg Oral QID Larene Pickett, PA-C   800 mg at 08/09/14 1314  . hydrochlorothiazide (HYDRODIURIL) tablet 25 mg  25 mg Oral Daily Larene Pickett, PA-C   25 mg at 08/09/14 0867  . HYDROcodone-acetaminophen (NORCO/VICODIN) 5-325 MG per tablet 1 tablet  1 tablet Oral Q6H PRN Larene Pickett, PA-C   1 tablet at 08/09/14 0940  . ibuprofen (ADVIL,MOTRIN) tablet 600 mg  600 mg Oral Q8H PRN Larene Pickett, PA-C   600 mg at 08/09/14 6195  . LORazepam (ATIVAN) tablet 1 mg  1 mg Oral Q8H PRN Larene Pickett, PA-C   1 mg at 08/09/14 1343  . meclizine (ANTIVERT) tablet 25 mg  25 mg Oral BID PRN Larene Pickett, PA-C      . mirtazapine (REMERON) tablet 15 mg  15 mg Oral QHS Larene Pickett, PA-C   15 mg at 08/08/14 2133  . ondansetron (ZOFRAN) tablet 4 mg  4 mg Oral Q8H PRN Larene Pickett, PA-C      . pyridOXINE (VITAMIN B-6) tablet 50 mg  50 mg Oral Daily Larene Pickett, PA-C   50 mg at 08/09/14 0932  . sodium chloride (OCEAN) 0.65 % nasal spray 1 spray  1 spray Each Nare Once Lacretia Leigh, MD   Stopped at 08/08/14 1023  . sodium chloride (OCEAN) 0.65 % nasal spray 1 spray  1 spray Each Nare PRN Lacretia Leigh, MD   1 spray at 08/09/14 1344  . SUMAtriptan (IMITREX) tablet 25 mg  25 mg Oral PRN Larene Pickett, PA-C      . traZODone (DESYREL) tablet  50 mg  50 mg Oral QHS PRN Kathlee Nations, MD   50 mg at 08/08/14 2135  . Vortioxetine HBr (BRINTELLIX) 20 MG tablet 20 mg  20 mg Oral Daily Delfin Gant, NP   20 mg at 08/09/14 9622   Current Outpatient Prescriptions  Medication Sig Dispense Refill  . amLODipine (NORVASC) 10 MG tablet Take 1 tablet by mouth at  bedtime 90 tablet 1  . aspirin 325 MG tablet Take 325 mg by mouth daily.    Marland Kitchen atorvastatin (LIPITOR) 20 MG tablet Take 1 tablet by  mouth at  bedtime 90 tablet 1  . calcium carbonate 1250 MG capsule Take 1,250 mg by mouth daily.     . carbidopa-levodopa (SINEMET CR) 50-200 MG per tablet Take 1 tablet by mouth at bedtime. 90 tablet 3  . carbidopa-levodopa (SINEMET IR) 25-250 MG per tablet Take 1 tablet by mouth 3  times a day 270 tablet 0  . cholecalciferol (VITAMIN D) 1000 UNITS tablet Take 1,000 Units by mouth daily.    Marland Kitchen gabapentin (NEURONTIN) 800 MG tablet Take 1 tablet by mouth 4  times daily 360 tablet 1  . hydrochlorothiazide (HYDRODIURIL) 25 MG tablet Take 1 tablet by mouth in  the morning 90 tablet 1  . HYDROcodone-acetaminophen (NORCO/VICODIN) 5-325 MG per tablet Take 1 tablet by mouth every 6 (six) hours as needed (MUST LAST 28 DAYS). 10 tablet 0  . mirtazapine (REMERON) 15 MG tablet Take one tablet by mouth every night at bedtime 90 tablet 3  . pyridOXINE (VITAMIN B-6) 50 MG tablet Take 50 mg by mouth daily.    . SUMAtriptan (IMITREX) 50 MG tablet See AUX Label (Patient taking differently: TAKE ONE TABLET AS NEEDED FOR MIGRAINES) 30 tablet 1  . Vortioxetine HBr (BRINTELLIX) 20 MG TABS Take 20 mg by mouth daily. 90 tablet 1  . acetaminophen (TYLENOL) 500 MG tablet Take 500 mg by mouth every 6 (six) hours as needed. Pain    . amoxicillin (AMOXIL) 500 MG capsule Take 500 mg by mouth. Take 4 pills 1 hour prior to dentist appointment    . meclizine (ANTIVERT) 25 MG tablet Take one tablet by mouth once daily as needed for dizziness. (Patient taking differently: Take 25 mg by mouth 2 (two) times daily as needed for dizziness. ) 30 tablet 0  . nystatin (MYCOSTATIN/NYSTOP) 100000 UNIT/GM POWD Apply beneath left breast for yeast rash 15 g 1  . Suvorexant (BELSOMRA) 10 MG TABS Take 10 mg by mouth at bedtime. For sleep 30 tablet 3  . [DISCONTINUED] potassium chloride 20 MEQ TBCR Take 20 mEq by mouth daily. 3 tablet 0    Musculoskeletal: Strength & Muscle Tone: within normal limits Gait & Station: normal Patient leans:  N/A  Psychiatric Specialty Exam:     Blood pressure 126/61, pulse 97, temperature 97.8 F (36.6 C), temperature source Oral, resp. rate 16, SpO2 97 %.There is no weight on file to calculate BMI.  General Appearance: Casual  Eye Contact::  Good  Speech:  Clear and Coherent and Normal Rate  Volume:  Normal  Mood:  Anxious, Depressed, Hopeless and helpless  Affect:  Congruent, Depressed, Flat and Tearful  Thought Process:  Coherent, Goal Directed and Intact  Orientation:  Full (Time, Place, and Person)  Thought Content:  WDL  Suicidal Thoughts:  No  Homicidal Thoughts:  No  Memory:  Immediate;   Good Recent;   Good Remote;   Good  Judgement:  Good  Insight:  Good  Psychomotor Activity:  Normal  Concentration:  Good  Recall:  NA  Fund of Knowledge:Good  Language: Good  Akathisia:  NA  Handed:  Right  AIMS (if indicated):     Assets:  Desire for Improvement  ADL's:  Intact  Cognition: WNL  Sleep:      Medical Decision Making: Established Problem, Worsening (2)  Treatment Plan Summary: Daily contact with patient to assess and evaluate symptoms and progress in treatment, Medication management and Plan Admitted and [facility].    Plan:  Recommend psychiatric Inpatient admission when medically cleared.  Disposition: Seek inpatient placement at appropriate facility.   Serena Colonel, FNP-BC [facility]  08/09/2014 1:51 PM  Patient seen face-to-face for psychiatric evaluation, chart reviewed and case discussed with the physician extender and developed treatment plan. Reviewed the information documented and agree with the treatment plan. Corena Pilgrim, MD

## 2014-08-09 NOTE — ED Notes (Signed)
Patient has slept a great deal today.  States it has been several days since she has had any real good sleep.  Became tearful around dinner time as she believes no one wants her.  I assured her that we are working on getting her admitted to [facility], but they are checking bed availability.  She did calm some and was able to stop crying.  States she has a Occupational hygienist at home that she loves and wants to get back to.  Also close to her sons.  [name] not suicidal, just very depressed.

## 2014-08-09 NOTE — ED Notes (Signed)
Pt. Up to rest room with assistance.

## 2014-08-09 NOTE — Progress Notes (Signed)
Spoke with EDP about trending potassium levels and meds in last 3 days for pt.

## 2014-08-09 NOTE — ED Notes (Signed)
Pt. C/o anxiety. 

## 2014-08-10 MED ORDER — CALCIUM CARBONATE 1250 (500 CA) MG PO TABS
1.0000 | ORAL_TABLET | Freq: Every day | ORAL | Status: DC
Start: 2014-08-10 — End: 2014-08-10
  Administered 2014-08-10: 500 mg via ORAL
  Filled 2014-08-10 (×2): qty 1

## 2014-08-10 MED ORDER — POTASSIUM CHLORIDE 20 MEQ PO PACK
20.0000 meq | PACK | Freq: Every day | ORAL | Status: DC
  Filled 2014-08-10: qty 1

## 2014-08-10 MED ORDER — POTASSIUM CHLORIDE CRYS ER 20 MEQ PO TBCR
20.0000 meq | EXTENDED_RELEASE_TABLET | Freq: Every day | ORAL | Status: DC
  Administered 2014-08-10: 20 meq via ORAL
  Filled 2014-08-10: qty 1

## 2014-08-10 NOTE — ED Notes (Signed)
Report called to Arbie Cookey, RN at [facility]

## 2014-08-10 NOTE — Consult Note (Signed)
[facility] Face-to-Face Psychiatry Follow Up Note   Patient Identification: [name] Principal Diagnosis: Major depressive disorder, recurrent severe without psychotic features Diagnosis:   Patient Active Problem List   Diagnosis Date Noted  . Major depressive disorder, recurrent severe without psychotic features [F33.2] 08/07/2014    Priority: High  . Insomnia [G47.00] 08/05/2014  . Hip joint painful on movement [M25.559] 08/05/2014  . Degenerative disc disease, lumbar [M51.36] 08/05/2014  . Severe obesity (BMI >= 40) [E66.01] 01/11/2014  . Hyperlipemia [E78.5]   . Depression [F32.9]   . Arthritis [M19.90]   . Polyneuropathy in other diseases classified elsewhere [G63] 04/08/2013  . Restless legs syndrome (RLS) [G25.81] 04/08/2013  . UTI (lower urinary tract infection) [N39.0] 10/14/2011  . Dizziness [R42] 10/12/2011  . Fall [W19.XXXA] 10/12/2011  . TIA (transient ischemic attack) [G45.9] 10/12/2011  . Cellulitis [L03.90] 10/12/2011  . HTN (hypertension) [I10] 10/12/2011  . GERD (gastroesophageal reflux disease) [K21.9] 10/12/2011    Total Time spent with patient: 20 minutes  Subjective:   [name] is a 74 y.o. female patient admitted with Depression.  HPI:  Caucasian female, 73 years was evaluated this morning for complaints of depression.  Patient stated that she is not able to get her dead husband out her mind since December.  Patient stated that she has been thinking of him and his death to the extent she cannot function.  Patient reported poor sleep and appetite. She reports feeling helpless and hopeless to the extent of lacking motivation to do anything.  Patient reported that she has been canceling doctors appointments and not cleaning her house because she cannot get herself out of bed to do anything.  She denies SI/HI/AVH but admitted to attempting suicide 5 years ago after her husband died.  She does not want to kill herself but reports increased  depression.  Collateral information was gotten from her son who stated that his mother may hurt herself again if not taken care of.  He stated that his mother feels hopeless and helpless and refuses to clean her house.  He stated that his mother calls him several times a day telling him how depressed and sad she feels.   He stated that his mother woke him up 3 am and asked him to bring her to the hospital.  We have accepted her for admission and will be looking for admission in a Psychiatric hospital with available bed.  08/08/14: Patient seen chart reviewed.  Patient remains very depressed and tearful and hopeless.  She reported poor sleep and racing thoughts.  She does not see any improvement in her depression.  She started Brintellex 20 mg 3 weeks ago by her primary care physician.  She was taking Prozac and it was switched because it was not working.  [name] also taking Remeron along with Belsorma and she does not see any improvement in her sleep.  She still have negative thinking with feeling of hopelessness and worthlessness.  She continued to endorse passive suicidal thoughts.  I had talked to her son who has lot of concern about the patient.  She has been calling her son every day because she is very depressed and losing the hope.  We are in process of getting beds as patient will require inpatient services.  08/09/14:  Patient evaluated by Dr. Darleene Cleaver and Manus Gunning, NP. Patient continues to endorse depression, insomnia, sadness, decreased motivation. Depression has been worsening since November. She states she is unable to do things  around her home even though she is aware that things need to be cleaned, she just doesn't have desire to clean her home or maintain her personal hygiene.  She states she is grieving the death of her husband and this is the 5th anniversary of his death. She lives alone.   Updates 08/10/2014 :  Patient was reevaluated this morning by Dr Darleene Cleaver and this Probation officer.  Patient was tearful  and stated"  I want to kill myself, life makes makes no sense anymore"  Patient admitted to feeling suicidal and feels hopeless and helpless.  She reports SI today but denies HI/AVH.  We will continue with our plan of care., seek placement.  Past Medical History:  Past Medical History  Diagnosis Date  . Hypertension   . GERD (gastroesophageal reflux disease)     hx pud '98  . Arthritis     djd  . Restless leg syndrome   . 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 Dysrhythmia     hx tachy palpitations  . Headache(784.0)     remote hx of migraines  . Neuromuscular disorder     peripheral neuropathy, FEET AND LEGS  . Depression     [facility] admission '09  . Stroke     ? 6 months ago - tests okay - [facility]   . Cellulitis     10/11/11 hospitalized for cellulitis   . Hyperlipemia   . Peripheral neuropathy   . Elbow fracture, left April 2015    Past Surgical History  Procedure Laterality Date  . Cervical disc surgery x2  2011  . Rt carotid enarterectomy  2011  . Rt knee arthroscopy  '99  . Left knee arthroscopy  2001  . Hematoma evacuation  2011    s/p rt cea  . Joint replacement  '01    total knee replacement, LEFT  . Abdominal hysterectomy  1975    bil oophorectomy  . Back surgery   MANY YRS AGO    laminectomy x3  . Cholecystectomy  1993  . Total knee arthroplasty  09/13/2011    Procedure: TOTAL KNEE ARTHROPLASTY;  Surgeon: Magnus Sinning, MD;  Location: [facility];  Service: Orthopedics;  Laterality: Right;  . Right knee replacement      09/2011   . Carpal tunnel release  07/09/2012    Procedure: CARPAL TUNNEL RELEASE;  Surgeon: Magnus Sinning, MD;  Location: [facility];  Service: Orthopedics;  Laterality: Left;  . Finger arthroplasty  07/09/2012    Procedure: FINGER ARTHROPLASTY;  Surgeon: Magnus Sinning, MD;  Location: [facility];  Service: Orthopedics;  Laterality: Left;  Interposition Arthroplasty [facility] Joint Thumb Left   . Lipoma excision  07/09/2012    Procedure: EXCISION  LIPOMA;  Surgeon: Magnus Sinning, MD;  Location: [facility];  Service: Orthopedics;  Laterality: Left;  Excision Lipoma Dorsum Left Wrist    Family History:  Family History  Problem Relation Age of Onset  . Congestive Heart Failure Father   . Congestive Heart Failure Sister   . Alzheimer's disease Mother   . Diabetes Brother   . Arthritis Brother    Social History:  History  Alcohol Use No     History  Drug Use No    History   Social History  . Marital Status: Widowed    Spouse Name: N/A  . Number of Children: 2  . Years of Education: 70   Social History Main Topics  .  Smoking status: Never Smoker   . Smokeless tobacco: Never Used  . Alcohol Use: No  . Drug Use: No  . Sexual Activity: Not on file   Other Topics Concern  . None   Social History Narrative   Patient is widowed and lives alone.   Patient has two sons.   Patient is retired.   Patient has a college education.   Patient is right-handed.   Patient drinks three glasses of Coke-soda daily.         Additional Social History:     Allergies:   Allergies  Allergen Reactions  . Sulfa Drugs Cross Reactors Nausea And Vomiting    Vitals: Blood pressure 146/53, pulse 90, temperature 98.5 F (36.9 C), temperature source Oral, resp. rate 18, SpO2 97 %.   Current Facility-Administered Medications  Medication Dose Route Frequency Provider Last Rate Last Dose  . acetaminophen (TYLENOL) tablet 650 mg  650 mg Oral Q4H PRN Larene Pickett, PA-C   650 mg at 08/07/14 1957  . alum & mag hydroxide-simeth (MAALOX/MYLANTA) 200-200-20 MG/5ML suspension 30 mL  30 mL Oral PRN Larene Pickett, PA-C      . amLODipine (NORVASC) tablet 10 mg  10 mg Oral Daily Larene Pickett, PA-C   10 mg at 08/10/14 0856  . aspirin tablet 325 mg  325 mg Oral Daily Larene Pickett, PA-C   325 mg at 08/10/14 2703  . atorvastatin (LIPITOR) tablet 20 mg  20 mg Oral q1800 Larene Pickett, PA-C   20 mg at 08/09/14 1847  . calcium carbonate (OS-CAL -  dosed in mg of elemental calcium) tablet 500 mg of elemental calcium  1 tablet Oral Q breakfast Larene Pickett, PA-C   500 mg of elemental calcium at 08/10/14 0942  . carbidopa-levodopa (SINEMET CR) 50-200 MG per tablet controlled release 1 tablet  1 tablet Oral QHS Larene Pickett, PA-C   1 tablet at 08/09/14 2117  . carbidopa-levodopa (SINEMET IR) 25-250 MG per tablet immediate release 1 tablet  1 tablet Oral TID Larene Pickett, PA-C   1 tablet at 08/10/14 0856  . cholecalciferol (VITAMIN D) tablet 1,000 Units  1,000 Units Oral Daily Larene Pickett, PA-C   1,000 Units at 08/10/14 5009  . gabapentin (NEURONTIN) capsule 800 mg  800 mg Oral QID Larene Pickett, PA-C   800 mg at 08/10/14 0855  . hydrochlorothiazide (HYDRODIURIL) tablet 25 mg  25 mg Oral Daily Larene Pickett, PA-C   25 mg at 08/10/14 0856  . HYDROcodone-acetaminophen (NORCO/VICODIN) 5-325 MG per tablet 1 tablet  1 tablet Oral Q6H PRN Larene Pickett, PA-C   1 tablet at 08/10/14 [id number]  . ibuprofen (ADVIL,MOTRIN) tablet 600 mg  600 mg Oral Q8H PRN Larene Pickett, PA-C   600 mg at 08/10/14 0319  . LORazepam (ATIVAN) tablet 1 mg  1 mg Oral Q8H PRN Larene Pickett, PA-C   1 mg at 08/09/14 2117  . meclizine (ANTIVERT) tablet 25 mg  25 mg Oral BID PRN Larene Pickett, PA-C      . mirtazapine (REMERON) tablet 15 mg  15 mg Oral QHS Larene Pickett, PA-C   15 mg at 08/09/14 2117  . ondansetron (ZOFRAN) tablet 4 mg  4 mg Oral Q8H PRN Larene Pickett, PA-C      . potassium chloride SA (K-DUR,KLOR-CON) CR tablet 20 mEq  20 mEq Oral Daily Landyn Lorincz      . pyridOXINE (VITAMIN B-6)  tablet 50 mg  50 mg Oral Daily Larene Pickett, PA-C   50 mg at 08/10/14 6237  . sodium chloride (OCEAN) 0.65 % nasal spray 1 spray  1 spray Each Nare Once Lacretia Leigh, MD   Stopped at 08/08/14 1023  . sodium chloride (OCEAN) 0.65 % nasal spray 1 spray  1 spray Each Nare PRN Lacretia Leigh, MD   1 spray at 08/10/14 0321  . SUMAtriptan (IMITREX) tablet 25 mg  25 mg Oral PRN  Larene Pickett, PA-C      . traZODone (DESYREL) tablet 50 mg  50 mg Oral QHS PRN Kathlee Nations, MD   50 mg at 08/10/14 0152  . Vortioxetine HBr (BRINTELLIX) 20 MG tablet 20 mg  20 mg Oral Daily Delfin Gant, NP   20 mg at 08/10/14 6283   Current Outpatient Prescriptions  Medication Sig Dispense Refill  . amLODipine (NORVASC) 10 MG tablet Take 1 tablet by mouth at  bedtime 90 tablet 1  . aspirin 325 MG tablet Take 325 mg by mouth daily.    Marland Kitchen atorvastatin (LIPITOR) 20 MG tablet Take 1 tablet by mouth at  bedtime 90 tablet 1  . calcium carbonate 1250 MG capsule Take 1,250 mg by mouth daily.     . carbidopa-levodopa (SINEMET CR) 50-200 MG per tablet Take 1 tablet by mouth at bedtime. 90 tablet 3  . carbidopa-levodopa (SINEMET IR) 25-250 MG per tablet Take 1 tablet by mouth 3  times a day 270 tablet 0  . cholecalciferol (VITAMIN D) 1000 UNITS tablet Take 1,000 Units by mouth daily.    Marland Kitchen gabapentin (NEURONTIN) 800 MG tablet Take 1 tablet by mouth 4  times daily 360 tablet 1  . hydrochlorothiazide (HYDRODIURIL) 25 MG tablet Take 1 tablet by mouth in  the morning 90 tablet 1  . HYDROcodone-acetaminophen (NORCO/VICODIN) 5-325 MG per tablet Take 1 tablet by mouth every 6 (six) hours as needed (MUST LAST 28 DAYS). 10 tablet 0  . mirtazapine (REMERON) 15 MG tablet Take one tablet by mouth every night at bedtime 90 tablet 3  . pyridOXINE (VITAMIN B-6) 50 MG tablet Take 50 mg by mouth daily.    . SUMAtriptan (IMITREX) 50 MG tablet See AUX Label (Patient taking differently: TAKE ONE TABLET AS NEEDED FOR MIGRAINES) 30 tablet 1  . Vortioxetine HBr (BRINTELLIX) 20 MG TABS Take 20 mg by mouth daily. 90 tablet 1  . acetaminophen (TYLENOL) 500 MG tablet Take 500 mg by mouth every 6 (six) hours as needed. Pain    . amoxicillin (AMOXIL) 500 MG capsule Take 500 mg by mouth. Take 4 pills 1 hour prior to dentist appointment    . meclizine (ANTIVERT) 25 MG tablet Take one tablet by mouth once daily as needed for  dizziness. (Patient taking differently: Take 25 mg by mouth 2 (two) times daily as needed for dizziness. ) 30 tablet 0  . nystatin (MYCOSTATIN/NYSTOP) 100000 UNIT/GM POWD Apply beneath left breast for yeast rash 15 g 1  . Suvorexant (BELSOMRA) 10 MG TABS Take 10 mg by mouth at bedtime. For sleep 30 tablet 3  . [DISCONTINUED] potassium chloride 20 MEQ TBCR Take 20 mEq by mouth daily. 3 tablet 0    Musculoskeletal: Strength & Muscle Tone: within normal limits Gait & Station: normal Patient leans: N/A  Psychiatric Specialty Exam:     Blood pressure 146/53, pulse 90, temperature 98.5 F (36.9 C), temperature source Oral, resp. rate 18, SpO2 97 %.There is no weight on  file to calculate BMI.  General Appearance: Casual  Eye Contact::  Good  Speech:  Clear and Coherent and Normal Rate  Volume:  Normal  Mood:  Anxious, Depressed, Hopeless and helpless  Affect:  Congruent, Depressed, Flat and Tearful  Thought Process:  Coherent, Goal Directed and Intact  Orientation:  Full (Time, Place, and Person)  Thought Content:  WDL  Suicidal Thoughts:  No  Homicidal Thoughts:  No  Memory:  Immediate;   Good Recent;   Good Remote;   Good  Judgement:  Good  Insight:  Good  Psychomotor Activity:  Normal  Concentration:  Good  Recall:  NA  Fund of Knowledge:Good  Language: Good  Akathisia:  NA  Handed:  Right  AIMS (if indicated):     Assets:  Desire for Improvement  ADL's:  Intact  Cognition: WNL  Sleep:      Medical Decision Making: Established Problem, Worsening (2)  Treatment Plan Summary: Daily contact with patient to assess and evaluate symptoms and progress in treatment, Medication management and Plan Admitted and [facility].    Plan:  Recommend psychiatric Inpatient admission when medically cleared.  Disposition: Seek inpatient placement at appropriate facility.   Charmaine Downs    PMHNP-BC [place]  08/10/2014 11:03 AM   Patient seen face-to-face for psychiatric evaluation, chart reviewed and case discussed with the physician extender and developed treatment plan. Reviewed the information documented and agree with the treatment plan. Corena Pilgrim, MD

## 2014-08-10 NOTE — Progress Notes (Signed)
Patient requested CSW to provide pt son with information for [place].   [name], [place] Work  [employer] [phone]

## 2014-08-10 NOTE — Progress Notes (Signed)
Pt accepted to [facility], by Dr. Lucilla Edin. RN to call report to [phone]. Pt to be transported by [employer] transportation.   [name], [place] Work  [employer] [phone]

## 2014-08-10 NOTE — ED Notes (Signed)
Called [facility] for tx 

## 2014-08-11 ENCOUNTER — Other Ambulatory Visit: Payer: Medicare Other

## 2014-08-18 ENCOUNTER — Other Ambulatory Visit: Payer: Self-pay | Admitting: Neurology

## 2014-08-18 MED ORDER — HYDROCODONE-ACETAMINOPHEN 5-325 MG PO TABS: 1.0000 | ORAL_TABLET | Freq: Four times a day (QID) | ORAL | Status: DC | PRN

## 2014-08-18 NOTE — Telephone Encounter (Signed)
Pt calling requesting a written Rx for HYDROcodone-acetaminophen (NORCO/VICODIN) 5-325 MG per tablet. Please call when ready for pick up.

## 2014-08-18 NOTE — Telephone Encounter (Signed)
Patient last got #10 from ED on 02/28.  Prior to that we prescribed #90 on 02/16.

## 2014-08-19 ENCOUNTER — Telehealth: Payer: Self-pay

## 2014-08-19 NOTE — Telephone Encounter (Signed)
Called patient to inform Rx ready for pick up at front desk. No answer. Will try later.  

## 2014-08-20 ENCOUNTER — Ambulatory Visit (INDEPENDENT_AMBULATORY_CARE_PROVIDER_SITE_OTHER): Payer: Medicare Other | Admitting: Internal Medicine

## 2014-08-20 ENCOUNTER — Encounter: Payer: Self-pay | Admitting: Internal Medicine

## 2014-08-20 VITALS — BP 118/72 | HR 60 | Temp 98.1°F | Wt 226.0 lb

## 2014-08-20 DIAGNOSIS — G4700 Insomnia, unspecified: Secondary | ICD-10-CM | POA: Diagnosis not present

## 2014-08-20 DIAGNOSIS — F4322 Adjustment disorder with anxiety: Secondary | ICD-10-CM | POA: Diagnosis not present

## 2014-08-20 DIAGNOSIS — F331 Major depressive disorder, recurrent, moderate: Secondary | ICD-10-CM | POA: Diagnosis not present

## 2014-08-20 DIAGNOSIS — M5136 Other intervertebral disc degeneration, lumbar region: Secondary | ICD-10-CM

## 2014-08-20 NOTE — Progress Notes (Signed)
Patient ID: [name], female   DOB: 1940-08-20, 74 y.o.   MRN: [id number]    Facility  [facility]    Place of Service:   OFFICE   Allergies  Allergen Reactions  . Sulfa Drugs Cross Reactors Nausea And Vomiting    Chief Complaint  Patient presents with  . Hospitalization Follow-up    went to ED 08/07/14 for depression    HPI:  74 yo female seen today for f/u of psych hospitalization in [place]. She spent several days inpt. Meds changed (new cymbalta 47m 2 daily, restoril 125mqhs prn, and lorazepam 79m57m8hrs prn). She now has blurry vision and sleeps all day but depression better. She noticed gait is unsteady and now using a walker. She has fallen x 3 since beginning new medications. [name] taking them ATC and did not know that some were written to take prn. She does not have a f/u with psych yet. She continues to grieve for deceased spouse who died 4 yrs ago. She feels paralyzed.  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 She cancelled imaging study due to hospitalization and has not rescheduled. No numbness/tingling  Past Medical History  Diagnosis Date  . Hypertension   . GERD (gastroesophageal reflux disease)     hx pud '98  . Arthritis     djd  . Restless leg syndrome   . 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 Dysrhythmia     hx tachy palpitations  . Headache(784.0)     remote hx of migraines  . Neuromuscular disorder     peripheral neuropathy, FEET AND LEGS  . Depression     bh admission '09  . Stroke     ? 6 months ago - tests okay - [facility]   . Cellulitis     10/11/11 hospitalized for cellulitis   . Hyperlipemia   . Peripheral neuropathy   . Elbow fracture, left April 2015   Past Surgical History  Procedure Laterality Date  . Cervical disc surgery x2  2011  . Rt carotid enarterectomy  2011  . Rt knee arthroscopy  '99  . Left knee arthroscopy  2001  . Hematoma evacuation  2011    s/p rt cea  . Joint replacement  '01    total knee  replacement, LEFT  . Abdominal hysterectomy  1975    bil oophorectomy  . Back surgery   MANY YRS AGO    laminectomy x3  . Cholecystectomy  1993  . Total knee arthroplasty  09/13/2011    Procedure: TOTAL KNEE ARTHROPLASTY;  Surgeon: JamMagnus SinningD;  Location: [facility];  Service: Orthopedics;  Laterality: Right;  . Right knee replacement      09/2011   . Carpal tunnel release  07/09/2012    Procedure: CARPAL TUNNEL RELEASE;  Surgeon: JamMagnus SinningD;  Location: [facility];  Service: Orthopedics;  Laterality: Left;  . Finger arthroplasty  07/09/2012    Procedure: FINGER ARTHROPLASTY;  Surgeon: JamMagnus SinningD;  Location: [facility];  Service: Orthopedics;  Laterality: Left;  Interposition Arthroplasty CMC Joint Thumb Left   . Lipoma excision  07/09/2012    Procedure: EXCISION LIPOMA;  Surgeon: JamMagnus SinningD;  Location: [facility];  Service: Orthopedics;  Laterality: Left;  Excision Lipoma Dorsum Left Wrist    History   Social History  . Marital Status: Widowed    Spouse Name: N/A  .  Number of Children: 2  . Years of Education: 60   Social History Main Topics  . Smoking status: Never Smoker   . Smokeless tobacco: Never Used  . Alcohol Use: No  . Drug Use: No  . Sexual Activity: Not on file   Other Topics Concern  . None   Social History Narrative   Patient is widowed and lives alone.   Patient has two sons.   Patient is retired.   Patient has a college education.   Patient is right-handed.   Patient drinks three glasses of Coke-soda daily.          Medications: Patient's Medications  New Prescriptions   No medications on file  Previous Medications   AMLODIPINE (NORVASC) 10 MG TABLET    Take 1 tablet by mouth at  bedtime   ASPIRIN 325 MG TABLET    Take 325 mg by mouth daily.   ATORVASTATIN (LIPITOR) 20 MG TABLET    Take 1 tablet by mouth at  bedtime   CALCIUM CARBONATE 1250 MG CAPSULE    Take 1,250 mg by mouth daily.    CARBIDOPA-LEVODOPA (SINEMET CR) 50-200 MG  PER TABLET    Take 1 tablet by mouth at bedtime.   CARBIDOPA-LEVODOPA (SINEMET IR) 25-250 MG PER TABLET    Take 1 tablet by mouth 3  times a day   CHOLECALCIFEROL (VITAMIN D) 1000 UNITS TABLET    Take 1,000 Units by mouth daily.   DULOXETINE (CYMBALTA) 60 MG CAPSULE    Take 60 mg by mouth. Take 2 capsule daily   GABAPENTIN (NEURONTIN) 800 MG TABLET    Take 1 tablet by mouth 4  times daily   HYDROCHLOROTHIAZIDE (HYDRODIURIL) 25 MG TABLET    Take 1 tablet by mouth in  the morning   HYDROCODONE-ACETAMINOPHEN (NORCO/VICODIN) 5-325 MG PER TABLET    Take 1 tablet by mouth every 6 (six) hours as needed (MUST LAST 28 DAYS).   LORAZEPAM (ATIVAN) 1 MG TABLET    Take 1 mg by mouth every 8 (eight) hours as needed for anxiety.   MECLIZINE (ANTIVERT) 25 MG TABLET    Take one tablet by mouth once daily as needed for dizziness.   MIRTAZAPINE (REMERON) 15 MG TABLET    Take one tablet by mouth every night at bedtime   NYSTATIN (MYCOSTATIN/NYSTOP) 100000 UNIT/GM POWD    Apply beneath left breast for yeast rash   PYRIDOXINE (VITAMIN B-6) 50 MG TABLET    Take 50 mg by mouth daily.   SUMATRIPTAN (IMITREX) 50 MG TABLET    See AUX Label   TEMAZEPAM (RESTORIL) 15 MG CAPSULE    Take 15 mg by mouth at bedtime as needed for sleep.  Modified Medications   No medications on file  Discontinued Medications   SUVOREXANT (BELSOMRA) 10 MG TABS    Take 10 mg by mouth at bedtime. For sleep   VORTIOXETINE HBR (BRINTELLIX) 20 MG TABS    Take 20 mg by mouth daily.     Review of Systems  Unable to perform ROS: Mental status change    Filed Vitals:   08/20/14 1509  BP: 118/72  Pulse: 60  Temp: 98.1 F (36.7 C)  TempSrc: Oral  Weight: 226 lb (102.513 kg)  SpO2: 94%   Body mass index is 37.61 kg/(m^2).  Physical Exam  Constitutional: She appears well-developed and well-nourished. No distress.  HENT:  Mouth/Throat: Oropharynx is clear and moist. No oropharyngeal exudate.  Eyes: Pupils are equal, round, and reactive  to  light. No scleral icterus.  Pupils dilated  Neck: Neck supple. No tracheal deviation present. No thyromegaly present.  Cardiovascular: Normal rate, regular rhythm, normal heart sounds and intact distal pulses.  Exam reveals no gallop and no friction rub.   No murmur heard. No LE edema b/l. no calf TTP. No carotid bruit b/l  Pulmonary/Chest: Effort normal and breath sounds normal. No stridor. No respiratory distress. She has no wheezes. She has no rales.  Abdominal: Soft. Bowel sounds are normal. She exhibits no distension and no mass. There is no tenderness. There is no rebound and no guarding.  Musculoskeletal:  Using rolling walker. Gait is unsteady  Lymphadenopathy:    She has no cervical adenopathy.  Neurological: [name] alert. She has normal reflexes.  Skin: Skin is warm and dry. No rash noted.  Psychiatric: She has a normal mood and affect. Thought content normal. Her speech is slurred. [name] slowed.     Labs reviewed: Admission on 08/07/2014, Discharged on 08/10/2014  Component Date Value Ref Range Status  . WBC 08/07/2014 10.1  4.0 - 10.5 K/uL Final  . RBC 08/07/2014 4.60  3.87 - 5.11 MIL/uL Final  . Hemoglobin 08/07/2014 14.2  12.0 - 15.0 g/dL Final  . HCT 08/07/2014 43.5  36.0 - 46.0 % Final  . MCV 08/07/2014 94.6  78.0 - 100.0 fL Final  . MCH 08/07/2014 30.9  26.0 - 34.0 pg Final  . MCHC 08/07/2014 32.6  30.0 - 36.0 g/dL Final  . RDW 08/07/2014 14.1  11.5 - 15.5 % Final  . Platelets 08/07/2014 356  150 - 400 K/uL Final  . Neutrophils Relative % 08/07/2014 66  43 - 77 % Final  . Neutro Abs 08/07/2014 6.6  1.7 - 7.7 K/uL Final  . Lymphocytes Relative 08/07/2014 26  12 - 46 % Final  . Lymphs Abs 08/07/2014 2.6  0.7 - 4.0 K/uL Final  . Monocytes Relative 08/07/2014 7  3 - 12 % Final  . Monocytes Absolute 08/07/2014 0.7  0.1 - 1.0 K/uL Final  . Eosinophils Relative 08/07/2014 1  0 - 5 % Final  . Eosinophils Absolute 08/07/2014 0.1  0.0 - 0.7 K/uL Final  . Basophils  Relative 08/07/2014 0  0 - 1 % Final  . Basophils Absolute 08/07/2014 0.0  0.0 - 0.1 K/uL Final  . Alcohol, Ethyl (B) 08/07/2014 <5  0 - 9 mg/dL Final   Comment:        LOWEST DETECTABLE LIMIT FOR SERUM ALCOHOL IS 11 mg/dL FOR MEDICAL PURPOSES ONLY   . Opiates 08/07/2014 NONE DETECTED  NONE DETECTED Final  . Cocaine 08/07/2014 NONE DETECTED  NONE DETECTED Final  . Benzodiazepines 08/07/2014 NONE DETECTED  NONE DETECTED Final  . Amphetamines 08/07/2014 NONE DETECTED  NONE DETECTED Final  . Tetrahydrocannabinol 08/07/2014 NONE DETECTED  NONE DETECTED Final  . Barbiturates 08/07/2014 NONE DETECTED  NONE DETECTED Final   Comment:        DRUG SCREEN FOR MEDICAL PURPOSES ONLY.  IF CONFIRMATION IS NEEDED FOR ANY PURPOSE, NOTIFY LAB WITHIN 5 DAYS.        LOWEST DETECTABLE LIMITS FOR URINE DRUG SCREEN Drug Class       Cutoff (ng/mL) Amphetamine      1000 Barbiturate      200 Benzodiazepine   409 Tricyclics       811 Opiates          300 Cocaine          300 THC  50   . Sodium 08/07/2014 132* 135 - 145 mmol/L Final  . Potassium 08/07/2014 2.5* 3.5 - 5.1 mmol/L Final   Comment: REPEATED TO VERIFY CRITICAL RESULT CALLED TO, READ BACK BY AND VERIFIED WITH: Thomes Dinning [id number] @ [id number] BY J SCOTTON   . Chloride 08/07/2014 90* 96 - 112 mmol/L Final  . CO2 08/07/2014 26  19 - 32 mmol/L Final  . Glucose, Bld 08/07/2014 201* 70 - 99 mg/dL Final  . BUN 08/07/2014 17  6 - 23 mg/dL Final  . Creatinine, Ser 08/07/2014 1.12* 0.50 - 1.10 mg/dL Final  . Calcium 08/07/2014 9.1  8.4 - 10.5 mg/dL Final  . Total Protein 08/07/2014 8.2  6.0 - 8.3 g/dL Final  . Albumin 08/07/2014 4.4  3.5 - 5.2 g/dL Final  . AST 08/07/2014 22  0 - 37 U/L Final  . ALT 08/07/2014 <5  0 - 35 U/L Final  . Alkaline Phosphatase 08/07/2014 80  39 - 117 U/L Final  . Total Bilirubin 08/07/2014 1.0  0.3 - 1.2 mg/dL Final  . GFR calc non Af Amer 08/07/2014 48* >90 mL/min Final  . GFR calc Af Amer 08/07/2014 55*  >90 mL/min Final   Comment: (NOTE) The eGFR has been calculated using the CKD EPI equation. This calculation has not been validated in all clinical situations. eGFR's persistently <90 mL/min signify possible Chronic Kidney Disease.   . Anion gap 08/07/2014 16* 5 - 15 Final  . Sodium 08/08/2014 132* 135 - 145 mmol/L Final  . Potassium 08/08/2014 2.6* 3.5 - 5.1 mmol/L Final   Comment: CRITICAL RESULT CALLED TO, READ BACK BY AND VERIFIED WITH: LEWIS,T AT 10:05AM ON 08/08/14 BY FESTERMAN,C   . Chloride 08/08/2014 95* 96 - 112 mmol/L Final  . CO2 08/08/2014 27  19 - 32 mmol/L Final  . Glucose, Bld 08/08/2014 199* 70 - 99 mg/dL Final  . BUN 08/08/2014 16  6 - 23 mg/dL Final  . Creatinine, Ser 08/08/2014 1.05  0.50 - 1.10 mg/dL Final  . Calcium 08/08/2014 9.5  8.4 - 10.5 mg/dL Final  . GFR calc non Af Amer 08/08/2014 52* >90 mL/min Final  . GFR calc Af Amer 08/08/2014 60* >90 mL/min Final   Comment: (NOTE) The eGFR has been calculated using the CKD EPI equation. This calculation has not been validated in all clinical situations. eGFR's persistently <90 mL/min signify possible Chronic Kidney Disease.   . Anion gap 08/08/2014 10  5 - 15 Final  . Potassium 08/08/2014 3.4* 3.5 - 5.1 mmol/L Final   DELTA CHECK NOTED  Office Visit on 08/05/2014  Component Date Value Ref Range Status  . Glucose 08/05/2014 CANCELED   Final-Edited   Comment: [facility] was unable to collect sufficient specimen to perform the following test(s), and is providing the patient with re-collection instructions.  Result canceled by the ancillary   . BUN 08/05/2014 CANCELED   Final-Edited   Comment: Test not performed  Result canceled by the ancillary   . Creatinine, Ser 08/05/2014 CANCELED   Final-Edited   Comment: Test not performed  Result canceled by the ancillary   . Sodium 08/05/2014 CANCELED   Final-Edited   Comment: Test not performed  Result canceled by the ancillary   . Potassium 08/05/2014  CANCELED   Final-Edited   Comment: Test not performed  Result canceled by the ancillary   . Chloride 08/05/2014 CANCELED   Final-Edited   Comment: Test not performed  Result canceled by the ancillary   . CO2 08/05/2014  CANCELED   Final-Edited   Comment: Test not performed  Result canceled by the ancillary   . Calcium 08/05/2014 CANCELED   Final-Edited   Comment: Test not performed  Result canceled by the ancillary    ED records reviewed. She was transferred to psych hospital for inpt care in [place], [place]. Psych records not available  Assessment/Plan   ICD-9-CM ICD-10-CM   1. Major depressive disorder, recurrent episode, moderate - on cymbalta 296.32 F33.1   2. Adjustment disorder with anxiety - on prn lorazepam 309.24 F43.22   3. Degenerative disc disease, lumbar - pain controlled 722.52 M51.36   4. Insomnia - on prn temazepam 780.52 G47.00      --discussed her medications. She will reduce lorazepam and temazepam to prn. Take cymbalta 1 cap BID instead of 2 caps at once.  --area psychiatrists' contact info supplied to pt today. She will call to schedule her an appt  --recommend she becomes involved in a grief support group  --keep appt with Dr Mariea Clonts as scheduled. RTO sooner if sx's do not resolve in next few days with med changes  Hilliard Borges S. Perlie Gold  [facility] [address] [place], [place] [address] [phone] Office (Wednesdays and Fridays 8 AM - 5 PM) [phone] Cell (Monday-Friday 8 AM - 5 PM)

## 2014-08-20 NOTE — Patient Instructions (Signed)
Change cymbalta to 1 capsule twice daily   Use ativan and restoril as needed  Continue other medications as ordered  Make appt with psych as soon as possible (contact information provided)  Keep appt with Dr Mariea Clonts as scheduled

## 2014-08-21 ENCOUNTER — Emergency Department (HOSPITAL_BASED_OUTPATIENT_CLINIC_OR_DEPARTMENT_OTHER)
Admission: EM | Admit: 2014-08-21 | Discharge: 2014-08-21 | Disposition: A | Discharge status: Home / Self Care | Payer: Medicare Other | Attending: Emergency Medicine | Admitting: Emergency Medicine

## 2014-08-21 ENCOUNTER — Emergency Department (HOSPITAL_BASED_OUTPATIENT_CLINIC_OR_DEPARTMENT_OTHER): Payer: Medicare Other

## 2014-08-21 ENCOUNTER — Encounter (HOSPITAL_BASED_OUTPATIENT_CLINIC_OR_DEPARTMENT_OTHER): Payer: Self-pay

## 2014-08-21 DIAGNOSIS — G629 Polyneuropathy, unspecified: Secondary | ICD-10-CM | POA: Diagnosis not present

## 2014-08-21 DIAGNOSIS — I10 Essential (primary) hypertension: Secondary | ICD-10-CM | POA: Insufficient documentation

## 2014-08-21 DIAGNOSIS — Y92091 Bathroom in other non-institutional residence as the place of occurrence of the external cause: Secondary | ICD-10-CM | POA: Diagnosis not present

## 2014-08-21 DIAGNOSIS — S89301A Unspecified physeal fracture of lower end of right fibula, initial encounter for closed fracture: Secondary | ICD-10-CM | POA: Diagnosis not present

## 2014-08-21 DIAGNOSIS — M1990 Unspecified osteoarthritis, unspecified site: Secondary | ICD-10-CM | POA: Diagnosis not present

## 2014-08-21 DIAGNOSIS — Z7982 Long term (current) use of aspirin: Secondary | ICD-10-CM | POA: Insufficient documentation

## 2014-08-21 DIAGNOSIS — G2581 Restless legs syndrome: Secondary | ICD-10-CM | POA: Diagnosis not present

## 2014-08-21 DIAGNOSIS — Z8673 Personal history of transient ischemic attack (TIA), and cerebral infarction without residual deficits: Secondary | ICD-10-CM | POA: Diagnosis not present

## 2014-08-21 DIAGNOSIS — Z8611 Personal history of tuberculosis: Secondary | ICD-10-CM | POA: Diagnosis not present

## 2014-08-21 DIAGNOSIS — S99911A Unspecified injury of right ankle, initial encounter: Secondary | ICD-10-CM | POA: Diagnosis present

## 2014-08-21 DIAGNOSIS — Z872 Personal history of diseases of the skin and subcutaneous tissue: Secondary | ICD-10-CM | POA: Diagnosis not present

## 2014-08-21 DIAGNOSIS — M25571 Pain in right ankle and joints of right foot: Secondary | ICD-10-CM | POA: Diagnosis not present

## 2014-08-21 DIAGNOSIS — W182XXA Fall in (into) shower or empty bathtub, initial encounter: Secondary | ICD-10-CM | POA: Insufficient documentation

## 2014-08-21 DIAGNOSIS — Z79899 Other long term (current) drug therapy: Secondary | ICD-10-CM | POA: Insufficient documentation

## 2014-08-21 DIAGNOSIS — E785 Hyperlipidemia, unspecified: Secondary | ICD-10-CM | POA: Diagnosis not present

## 2014-08-21 DIAGNOSIS — Y93E1 Activity, personal bathing and showering: Secondary | ICD-10-CM | POA: Insufficient documentation

## 2014-08-21 DIAGNOSIS — S82401A Unspecified fracture of shaft of right fibula, initial encounter for closed fracture: Secondary | ICD-10-CM | POA: Diagnosis not present

## 2014-08-21 DIAGNOSIS — G43909 Migraine, unspecified, not intractable, without status migrainosus: Secondary | ICD-10-CM | POA: Insufficient documentation

## 2014-08-21 DIAGNOSIS — F329 Major depressive disorder, single episode, unspecified: Secondary | ICD-10-CM | POA: Diagnosis not present

## 2014-08-21 DIAGNOSIS — K219 Gastro-esophageal reflux disease without esophagitis: Secondary | ICD-10-CM | POA: Insufficient documentation

## 2014-08-21 DIAGNOSIS — Y998 Other external cause status: Secondary | ICD-10-CM | POA: Insufficient documentation

## 2014-08-21 MED ORDER — HYDROCODONE-ACETAMINOPHEN 5-325 MG PO TABS
1.0000 | ORAL_TABLET | Freq: Once | ORAL | Status: AC
  Administered 2014-08-21: 1 via ORAL
  Filled 2014-08-21: qty 1

## 2014-08-21 MED ORDER — OXYCODONE-ACETAMINOPHEN 5-325 MG PO TABS
1.0000 | ORAL_TABLET | Freq: Once | ORAL | Status: AC
  Administered 2014-08-21: 1 via ORAL
  Filled 2014-08-21: qty 1

## 2014-08-21 MED ORDER — OXYCODONE-ACETAMINOPHEN 5-325 MG PO TABS: 1.0000 | ORAL_TABLET | Freq: Four times a day (QID) | ORAL | Status: DC | PRN

## 2014-08-21 NOTE — ED Provider Notes (Signed)
CSN: [id number]     Arrival date & time 08/21/14  1058 History   First MD Initiated Contact with Patient 08/21/14 1147     Chief Complaint  Patient presents with  . Fall     (Consider location/radiation/quality/duration/timing/severity/associated sxs/prior Treatment) HPI  This is a 74 year old female with a history of hypertension, arthritis who presents with a right ankle injury. Patient reports that she was stepping out of the tub when she misstepped and turned her right ankle.  Denies falling or hitting her head. Denies loss of consciousness. Denies any other injury. Reports 8 out of 10 pain.  Denies any knee pain.  Past Medical History  Diagnosis Date  . Hypertension   . GERD (gastroesophageal reflux disease)     hx pud '98  . Arthritis     djd  . Restless leg syndrome   . 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 Dysrhythmia     hx tachy palpitations  . Headache(784.0)     remote hx of migraines  . Neuromuscular disorder     peripheral neuropathy, FEET AND LEGS  . Depression     bh admission '09  . Stroke     ? 6 months ago - tests okay - [facility]   . Cellulitis     10/11/11 hospitalized for cellulitis   . Hyperlipemia   . Peripheral neuropathy   . Elbow fracture, left April 2015   Past Surgical History  Procedure Laterality Date  . Cervical disc surgery x2  2011  . Rt carotid enarterectomy  2011  . Rt knee arthroscopy  '99  . Left knee arthroscopy  2001  . Hematoma evacuation  2011    s/p rt cea  . Joint replacement  '01    total knee replacement, LEFT  . Abdominal hysterectomy  1975    bil oophorectomy  . Back surgery   MANY YRS AGO    laminectomy x3  . Cholecystectomy  1993  . Total knee arthroplasty  09/13/2011    Procedure: TOTAL KNEE ARTHROPLASTY;  Surgeon: Magnus Sinning, MD;  Location: [facility];  Service: Orthopedics;  Laterality: Right;  . Right knee replacement      09/2011   . Carpal tunnel release  07/09/2012    Procedure: CARPAL TUNNEL  RELEASE;  Surgeon: Magnus Sinning, MD;  Location: [facility];  Service: Orthopedics;  Laterality: Left;  . Finger arthroplasty  07/09/2012    Procedure: FINGER ARTHROPLASTY;  Surgeon: Magnus Sinning, MD;  Location: [facility];  Service: Orthopedics;  Laterality: Left;  Interposition Arthroplasty CMC Joint Thumb Left   . Lipoma excision  07/09/2012    Procedure: EXCISION LIPOMA;  Surgeon: Magnus Sinning, MD;  Location: [facility];  Service: Orthopedics;  Laterality: Left;  Excision Lipoma Dorsum Left Wrist    Family History  Problem Relation Age of Onset  . Congestive Heart Failure Father   . Congestive Heart Failure Sister   . Alzheimer's disease Mother   . Diabetes Brother   . Arthritis Brother    History  Substance Use Topics  . Smoking status: Never Smoker   . Smokeless tobacco: Never Used  . Alcohol Use: No   OB History    No data available     Review of Systems  Musculoskeletal:       Right ankle pain  Neurological: Negative for dizziness and syncope.      Allergies  Sulfa drugs cross reactors  Home Medications   Prior to Admission medications   Medication Sig Start Date End Date Taking? Authorizing Provider  amLODipine (NORVASC) 10 MG tablet Take 1 tablet by mouth at  bedtime 07/20/14   [name] L [name], DO  aspirin 325 MG tablet Take 325 mg by mouth daily.    Historical Provider, MD  atorvastatin (LIPITOR) 20 MG tablet Take 1 tablet by mouth at  bedtime 06/21/14   [name] L [name], DO  calcium carbonate 1250 MG capsule Take 1,250 mg by mouth daily.     Historical Provider, MD  carbidopa-levodopa (SINEMET CR) 50-200 MG per tablet Take 1 tablet by mouth at bedtime. 06/10/14   [name] L [name], DO  carbidopa-levodopa (SINEMET IR) 25-250 MG per tablet Take 1 tablet by mouth 3  times a day 06/07/14   Lauree Chandler, NP  cholecalciferol (VITAMIN D) 1000 UNITS tablet Take 1,000 Units by mouth daily.    Historical Provider, MD  DULoxetine (CYMBALTA) 60 MG capsule Take 60 mg by mouth.  Take 2 capsule daily    Historical Provider, MD  gabapentin (NEURONTIN) 800 MG tablet Take 1 tablet by mouth 4  times daily 07/31/14   Kathrynn Ducking, MD  hydrochlorothiazide (HYDRODIURIL) 25 MG tablet Take 1 tablet by mouth in  the morning 07/20/14   [name] L [name], DO  HYDROcodone-acetaminophen (NORCO/VICODIN) 5-325 MG per tablet Take 1 tablet by mouth every 6 (six) hours as needed (MUST LAST 28 DAYS). 08/18/14   Kathrynn Ducking, MD  LORazepam (ATIVAN) 1 MG tablet Take 1 mg by mouth every 8 (eight) hours as needed for anxiety.    Historical Provider, MD  meclizine (ANTIVERT) 25 MG tablet Take one tablet by mouth once daily as needed for dizziness. Patient taking differently: Take 25 mg by mouth 2 (two) times daily as needed for dizziness.  05/26/14   [name] L [name], DO  mirtazapine (REMERON) 15 MG tablet Take one tablet by mouth every night at bedtime 06/10/14   [name] L [name], DO  nystatin (MYCOSTATIN/NYSTOP) 100000 UNIT/GM POWD Apply beneath left breast for yeast rash 01/11/14   [name] L [name], DO  oxyCODONE-acetaminophen (PERCOCET/ROXICET) 5-325 MG per tablet Take 1 tablet by mouth every 6 (six) hours as needed for severe pain. 08/21/14   Merryl Hacker, MD  pyridOXINE (VITAMIN B-6) 50 MG tablet Take 50 mg by mouth daily.    Historical Provider, MD  SUMAtriptan (IMITREX) 50 MG tablet See AUX Label Patient taking differently: TAKE ONE TABLET AS NEEDED FOR MIGRAINES 07/20/14   [name] L [name], DO  temazepam (RESTORIL) 15 MG capsule Take 15 mg by mouth at bedtime as needed for sleep.    Historical Provider, MD   BP 122/61 mmHg  Pulse 97  Temp(Src) 97.6 F (36.4 C) (Oral)  Resp 16  Ht 5\' 5"  (1.651 m)  Wt 216 lb (97.977 kg)  BMI 35.94 kg/m2  SpO2 95% Physical Exam  Constitutional: [name] oriented to person, place, and time. She appears well-developed and well-nourished. No distress.  HENT:  Head: Normocephalic and atraumatic.  Cardiovascular: Normal rate and regular rhythm.    Pulmonary/Chest: Effort normal. No respiratory distress.  Musculoskeletal:  Tenderness palpation over the right lateral malleolus, no obvious deformity, mild swelling, 2+ DP pulse  Neurological: [name] alert and oriented to person, place, and time.  Skin: Skin is warm and dry.  Psychiatric: She has a normal mood and affect.  Nursing note and vitals reviewed.   ED Course  Procedures (including  critical care time) Labs Review Labs Reviewed - No data to display  Imaging Review Dg Ankle Complete Right  08/21/2014   CLINICAL DATA:  Initial evaluation right ankle pain, fell getting out of tub today  EXAM: RIGHT ANKLE - COMPLETE 3+ VIEW  COMPARISON:  None  FINDINGS: Comminuted mildly displaced fracture distal fibula shaft. No other abnormalities.  IMPRESSION: Fibular fracture   Electronically Signed   By: Skipper Cliche M.D.   On: 08/21/2014 11:43     EKG Interpretation None      MDM   Final diagnoses:  Closed fibular fracture, right, initial encounter    Patient presents with injury to the right ankle after stepping out of the bathtub. Denies any other injury, syncope, or fall. Plain films notable for fibula fracture. Patient walks with a walker at baseline. Discussed with Dr.Aluisio who agrees patient can be placed in a Cam Walker boot. Patient was given pain medication. She was able to ambulate in the hallway. Patient does live alone. The patient's family lives close by and I have encouraged her have family stay with her for the next 24 hours. Patient stated understanding. She should follow-up in orthopedics clinic this coming week.  After history, exam, and medical workup I feel the patient has been appropriately medically screened and is safe for discharge home. Pertinent diagnoses were discussed with the patient. Patient was given return precautions.     Merryl Hacker, MD 08/21/14 [phone]

## 2014-08-21 NOTE — ED Notes (Signed)
Patient here with right ankle pain after falling while getting out of shower this am, no obvious deformity, swelling noted

## 2014-08-21 NOTE — ED Notes (Signed)
Pt ambulated to restroom with walker

## 2014-08-21 NOTE — ED Notes (Signed)
Pt able to ambulate approximately 3 feet with the walker and stated she could not go any farther. Pt assisted back to bed.

## 2014-08-21 NOTE — Discharge Instructions (Signed)
You have a fracture of your fibula. You will be placed in a walker boot. You should take pain medication as prescribed. You should follow-up with orthopedics within the week for further evaluation.   Fibular Fracture, Ankle, Adult, Treated With or Without Immobilization A fibular fracture at your ankle is a break (fracture) bone in the smallest of the two bones in your lower leg, located on the outside of your leg (fibula) close to the area at your ankle joint. CAUSES  Rolling your ankle.  Twisting your ankle.  Extreme flexing or extending of your foot.  Severe force on your ankle as when falling from a distance. RISK FACTORS  Jumping activities.  Participation in sports.  Osteoporosis.  Advanced age.  Previous ankle injuries. SIGNS AND SYMPTOMS  Pain.  Swelling.  Inability to put weight on injured ankle.  Bruising.  Bone deformities at site of injury. DIAGNOSIS  This fracture is diagnosed with the help of an X-ray exam. TREATMENT  If the fractured bone did not move out of place it usually will heal without problems and does casting or splinting. If immobilization is needed for comfort or the fractured bone moved out of place and will not heal properly with immobilization, a cast or splint will be used. HOME CARE INSTRUCTIONS   Apply ice to the area of injury:  Put ice in a plastic bag.  Place a towel between your skin and the bag.  Leave the ice on for 20 minutes, 2-3 times a day.  Use crutches as directed. Resume walking without crutches as directed by your health care provider.  Only take over-the-counter or prescription medicines for pain, discomfort, or fever as directed by your health care provider.  If you have a removable splint or boot, do not remove the boot unless directed by your health care provider. SEEK MEDICAL CARE IF:   You have continued pain or more swelling  The medications do not control the pain. SEEK IMMEDIATE MEDICAL CARE IF:  You  develop severe pain in the leg or foot.  Your skin or nails below the injury turn blue or grey or feel cold or numb. MAKE SURE YOU:   Understand these instructions.  Will watch your condition.  Will get help right away if you are not doing well or get worse. Document Released: 05/21/2005 Document Revised: 03/11/2013 Document Reviewed: 12/31/2012 [facility] Patient Information 2015 [place], [place]. This information is not intended to replace advice given to you by your health care provider. Make sure you discuss any questions you have with your health care provider.

## 2014-08-21 NOTE — ED Notes (Signed)
Pt states son is in waiting room and she wants him called back to her room. Registration aware.

## 2014-08-21 NOTE — ED Notes (Signed)
Attempted to ambulate pt, pt c/o nausea and states she cannot do it without a walker. Pt assisted back to bed.

## 2014-08-21 NOTE — ED Notes (Signed)
PT request son back to room. Registration called and no one in waiting room. Pt aware and wants to call son at home. Called  No answer, number has busy signal. Will pass on to oncoming RN.

## 2014-08-21 NOTE — ED Notes (Signed)
Short leg splint removed and cam walker was applied

## 2014-08-24 DIAGNOSIS — S82451A Displaced comminuted fracture of shaft of right fibula, initial encounter for closed fracture: Secondary | ICD-10-CM | POA: Diagnosis not present

## 2014-08-24 DIAGNOSIS — M25571 Pain in right ankle and joints of right foot: Secondary | ICD-10-CM | POA: Diagnosis not present

## 2014-08-27 ENCOUNTER — Inpatient Hospital Stay: Admission: RE | Admit: 2014-08-27 | Payer: Self-pay | Source: Ambulatory Visit

## 2014-08-27 DIAGNOSIS — F329 Major depressive disorder, single episode, unspecified: Secondary | ICD-10-CM | POA: Diagnosis not present

## 2014-08-27 DIAGNOSIS — M1990 Unspecified osteoarthritis, unspecified site: Secondary | ICD-10-CM | POA: Diagnosis not present

## 2014-08-27 DIAGNOSIS — I10 Essential (primary) hypertension: Secondary | ICD-10-CM | POA: Diagnosis not present

## 2014-08-27 DIAGNOSIS — I739 Peripheral vascular disease, unspecified: Secondary | ICD-10-CM | POA: Diagnosis not present

## 2014-08-27 DIAGNOSIS — S82401D Unspecified fracture of shaft of right fibula, subsequent encounter for closed fracture with routine healing: Secondary | ICD-10-CM | POA: Diagnosis not present

## 2014-08-27 DIAGNOSIS — G629 Polyneuropathy, unspecified: Secondary | ICD-10-CM | POA: Diagnosis not present

## 2014-08-27 DIAGNOSIS — W19XXXD Unspecified fall, subsequent encounter: Secondary | ICD-10-CM | POA: Diagnosis not present

## 2014-09-02 ENCOUNTER — Other Ambulatory Visit: Payer: Self-pay | Admitting: *Deleted

## 2014-09-02 MED ORDER — CARBIDOPA-LEVODOPA ER 50-200 MG PO TBCR: 1.0000 | EXTENDED_RELEASE_TABLET | Freq: Every day | ORAL | Status: DC

## 2014-09-02 MED ORDER — CARBIDOPA-LEVODOPA 25-250 MG PO TABS: ORAL_TABLET | ORAL | Status: DC

## 2014-09-02 NOTE — Telephone Encounter (Signed)
[facility] Fax Order

## 2014-09-06 ENCOUNTER — Telehealth: Payer: Self-pay | Admitting: *Deleted

## 2014-09-06 MED ORDER — LORAZEPAM 1 MG PO TABS: 1.0000 mg | ORAL_TABLET | Freq: Three times a day (TID) | ORAL | Status: DC | PRN

## 2014-09-06 MED ORDER — DULOXETINE HCL 60 MG PO CPEP: ORAL_CAPSULE | ORAL | Status: DC

## 2014-09-06 MED ORDER — TEMAZEPAM 15 MG PO CAPS: 15.0000 mg | ORAL_CAPSULE | Freq: Every evening | ORAL | Status: DC | PRN

## 2014-09-06 NOTE — Telephone Encounter (Signed)
Printed and faxed to [facility]. Patient Notified and agreed.

## 2014-09-06 NOTE — Telephone Encounter (Signed)
Patient called and stated that she saw you on 08/20/2014 and you were going to call in refills for her Cymbalta, Ativan and Resteril. She stated that she thought you were going to refill these medication at the appointment. Is this ok to fax to [facility]? Please Advise.

## 2014-09-06 NOTE — Telephone Encounter (Signed)
She told me she did not need RF during her OV. She was a poor historian so she probably does not remember our conversation. You may send Rx to [facility]. Please verify that she is taking the lorazepam and restoril PRN. No RF. May have 1 Rf on cymbalta. She also needs to f/u with psych if she has not done so as they will need to takeover rx meds. pls verify that she has f/u with Dr Mariea Clonts. Thank you

## 2014-09-07 DIAGNOSIS — S82451D Displaced comminuted fracture of shaft of right fibula, subsequent encounter for closed fracture with routine healing: Secondary | ICD-10-CM | POA: Diagnosis not present

## 2014-09-08 ENCOUNTER — Other Ambulatory Visit: Payer: Self-pay | Admitting: *Deleted

## 2014-09-08 DIAGNOSIS — F32A Depression, unspecified: Secondary | ICD-10-CM

## 2014-09-08 DIAGNOSIS — F329 Major depressive disorder, single episode, unspecified: Secondary | ICD-10-CM

## 2014-09-08 MED ORDER — DULOXETINE HCL 60 MG PO CPEP
ORAL_CAPSULE | ORAL | Status: DC
Start: 2014-09-08 — End: 2014-11-24

## 2014-09-09 DIAGNOSIS — S82451D Displaced comminuted fracture of shaft of right fibula, subsequent encounter for closed fracture with routine healing: Secondary | ICD-10-CM | POA: Diagnosis not present

## 2014-09-13 ENCOUNTER — Telehealth: Payer: Self-pay | Admitting: *Deleted

## 2014-09-13 NOTE — Telephone Encounter (Signed)
Patient called and wanted to know if Rx was faxed to [facility] for Restoril and Ativan. Rx's faxed. Patient stated that she will call [facility] to see if they received due to being faxed on 09/06/14.

## 2014-09-13 NOTE — Telephone Encounter (Signed)
Spoke with Cecille Rubin at [facility] and all they needed was a Dr. Name Janett Billow work under since she is a NP and signing faxes for that day. Dr. Rolly Salter name and NPI given. Cecille Rubin will ship out medication today. Patient Notified.

## 2014-09-13 NOTE — Progress Notes (Signed)
Anesthesia Chart Review:  Pt is 74 year old female scheduled for ORIF R ankle on 09/14/2014 with Dr. Doran Durand.   Pt is same day work up.   PMH includes: HTN, dysrhythmia (hx tachy palpitations), stroke, hyperlipidemia. Never smoker. BMI 36. S/p R CEA 2011. S/p carpal tunnel release 07/09/12, s/p R TKA 09/13/11.   Echo 10/12/2011: Left ventricle: Wall thickness was increased in a pattern of mild LVH.  Systolic function was normal. The estimated ejection fraction was in the range of 60% to 65%. There was an increased relative contribution of atrial contraction to ventricular filling.   Carotid doppler 10/12/2011: No significant extracranial carotid artery stenosis demonstrated. Vertebrals are patent with antegrade flow.  EKG 01/11/2014: sinus rhythm. ST & T abnormality, consider anterolateral ischemia and inferolateral ischemia. T abnormality in anterior leads. EKG looks similar dating back to 10/2009.  Since that 10/2009 EKG, pt has had a R CEA, carpal tunnel release and R TKA.   Given that pt has had multiple surgeries since 2011 with similar looking EKGs, I anticipate she can proceed with this surgery as scheduled if her pre-op labs are acceptable.  Willeen Cass, FNP-BC [facility]/Anesthesiology Phone: [phone] 09/13/2014 3:50 PM

## 2014-09-14 ENCOUNTER — Encounter (HOSPITAL_COMMUNITY): Payer: Self-pay

## 2014-09-14 ENCOUNTER — Inpatient Hospital Stay (HOSPITAL_COMMUNITY): Payer: Medicare Other | Admitting: Emergency Medicine

## 2014-09-14 ENCOUNTER — Encounter (HOSPITAL_COMMUNITY)
Admission: RE | Disposition: A | Discharge status: Skilled Nursing Facility | Payer: Self-pay | Source: Ambulatory Visit | Attending: Orthopedic Surgery

## 2014-09-14 ENCOUNTER — Inpatient Hospital Stay (HOSPITAL_COMMUNITY)
Admission: RE | Admit: 2014-09-14 | Discharge: 2014-09-20 | DRG: 492 | Disposition: A | Discharge status: Skilled Nursing Facility | Payer: Medicare Other | Source: Ambulatory Visit | Attending: Orthopedic Surgery | Admitting: Orthopedic Surgery

## 2014-09-14 DIAGNOSIS — E785 Hyperlipidemia, unspecified: Secondary | ICD-10-CM | POA: Diagnosis present

## 2014-09-14 DIAGNOSIS — K219 Gastro-esophageal reflux disease without esophagitis: Secondary | ICD-10-CM | POA: Diagnosis not present

## 2014-09-14 DIAGNOSIS — G8918 Other acute postprocedural pain: Secondary | ICD-10-CM | POA: Diagnosis not present

## 2014-09-14 DIAGNOSIS — M1990 Unspecified osteoarthritis, unspecified site: Secondary | ICD-10-CM | POA: Diagnosis present

## 2014-09-14 DIAGNOSIS — M25571 Pain in right ankle and joints of right foot: Secondary | ICD-10-CM | POA: Diagnosis present

## 2014-09-14 DIAGNOSIS — Z96653 Presence of artificial knee joint, bilateral: Secondary | ICD-10-CM | POA: Diagnosis not present

## 2014-09-14 DIAGNOSIS — I10 Essential (primary) hypertension: Secondary | ICD-10-CM | POA: Diagnosis present

## 2014-09-14 DIAGNOSIS — Z6841 Body Mass Index (BMI) 40.0 and over, adult: Secondary | ICD-10-CM

## 2014-09-14 DIAGNOSIS — S8261XA Displaced fracture of lateral malleolus of right fibula, initial encounter for closed fracture: Secondary | ICD-10-CM | POA: Diagnosis not present

## 2014-09-14 DIAGNOSIS — K5900 Constipation, unspecified: Secondary | ICD-10-CM | POA: Diagnosis not present

## 2014-09-14 DIAGNOSIS — S93491A Sprain of other ligament of right ankle, initial encounter: Secondary | ICD-10-CM | POA: Diagnosis not present

## 2014-09-14 DIAGNOSIS — L03119 Cellulitis of unspecified part of limb: Secondary | ICD-10-CM | POA: Diagnosis not present

## 2014-09-14 DIAGNOSIS — Z79899 Other long term (current) drug therapy: Secondary | ICD-10-CM

## 2014-09-14 DIAGNOSIS — R0902 Hypoxemia: Secondary | ICD-10-CM | POA: Diagnosis not present

## 2014-09-14 DIAGNOSIS — Z9181 History of falling: Secondary | ICD-10-CM | POA: Diagnosis not present

## 2014-09-14 DIAGNOSIS — Z6836 Body mass index (BMI) 36.0-36.9, adult: Secondary | ICD-10-CM

## 2014-09-14 DIAGNOSIS — S8261XB Displaced fracture of lateral malleolus of right fibula, initial encounter for open fracture type I or II: Secondary | ICD-10-CM | POA: Diagnosis not present

## 2014-09-14 DIAGNOSIS — J9600 Acute respiratory failure, unspecified whether with hypoxia or hypercapnia: Secondary | ICD-10-CM | POA: Clinically undetermined

## 2014-09-14 DIAGNOSIS — Z90710 Acquired absence of both cervix and uterus: Secondary | ICD-10-CM | POA: Diagnosis not present

## 2014-09-14 DIAGNOSIS — F329 Major depressive disorder, single episode, unspecified: Secondary | ICD-10-CM | POA: Diagnosis not present

## 2014-09-14 DIAGNOSIS — M21271 Flexion deformity, right ankle and toes: Secondary | ICD-10-CM | POA: Diagnosis not present

## 2014-09-14 DIAGNOSIS — K210 Gastro-esophageal reflux disease with esophagitis: Secondary | ICD-10-CM | POA: Diagnosis not present

## 2014-09-14 DIAGNOSIS — G629 Polyneuropathy, unspecified: Secondary | ICD-10-CM | POA: Diagnosis present

## 2014-09-14 DIAGNOSIS — G459 Transient cerebral ischemic attack, unspecified: Secondary | ICD-10-CM | POA: Diagnosis not present

## 2014-09-14 DIAGNOSIS — T50905A Adverse effect of unspecified drugs, medicaments and biological substances, initial encounter: Secondary | ICD-10-CM | POA: Diagnosis not present

## 2014-09-14 DIAGNOSIS — S82892D Other fracture of left lower leg, subsequent encounter for closed fracture with routine healing: Secondary | ICD-10-CM | POA: Diagnosis not present

## 2014-09-14 DIAGNOSIS — K5909 Other constipation: Secondary | ICD-10-CM | POA: Diagnosis not present

## 2014-09-14 DIAGNOSIS — E869 Volume depletion, unspecified: Secondary | ICD-10-CM | POA: Diagnosis not present

## 2014-09-14 DIAGNOSIS — G92 Toxic encephalopathy: Secondary | ICD-10-CM | POA: Diagnosis not present

## 2014-09-14 DIAGNOSIS — S8262XA Displaced fracture of lateral malleolus of left fibula, initial encounter for closed fracture: Secondary | ICD-10-CM | POA: Diagnosis not present

## 2014-09-14 DIAGNOSIS — G2581 Restless legs syndrome: Secondary | ICD-10-CM | POA: Diagnosis not present

## 2014-09-14 DIAGNOSIS — Z7982 Long term (current) use of aspirin: Secondary | ICD-10-CM

## 2014-09-14 DIAGNOSIS — J9601 Acute respiratory failure with hypoxia: Secondary | ICD-10-CM | POA: Diagnosis not present

## 2014-09-14 DIAGNOSIS — W19XXXA Unspecified fall, initial encounter: Secondary | ICD-10-CM | POA: Diagnosis present

## 2014-09-14 DIAGNOSIS — G4700 Insomnia, unspecified: Secondary | ICD-10-CM | POA: Diagnosis not present

## 2014-09-14 DIAGNOSIS — R2681 Unsteadiness on feet: Secondary | ICD-10-CM | POA: Diagnosis not present

## 2014-09-14 DIAGNOSIS — Z882 Allergy status to sulfonamides status: Secondary | ICD-10-CM | POA: Diagnosis not present

## 2014-09-14 DIAGNOSIS — G9340 Encephalopathy, unspecified: Secondary | ICD-10-CM | POA: Diagnosis not present

## 2014-09-14 DIAGNOSIS — E876 Hypokalemia: Secondary | ICD-10-CM | POA: Diagnosis present

## 2014-09-14 DIAGNOSIS — G4733 Obstructive sleep apnea (adult) (pediatric): Secondary | ICD-10-CM

## 2014-09-14 DIAGNOSIS — F332 Major depressive disorder, recurrent severe without psychotic features: Secondary | ICD-10-CM | POA: Diagnosis present

## 2014-09-14 DIAGNOSIS — M5136 Other intervertebral disc degeneration, lumbar region: Secondary | ICD-10-CM | POA: Diagnosis not present

## 2014-09-14 DIAGNOSIS — Z79891 Long term (current) use of opiate analgesic: Secondary | ICD-10-CM | POA: Diagnosis not present

## 2014-09-14 DIAGNOSIS — Z4789 Encounter for other orthopedic aftercare: Secondary | ICD-10-CM | POA: Diagnosis not present

## 2014-09-14 DIAGNOSIS — N390 Urinary tract infection, site not specified: Secondary | ICD-10-CM | POA: Diagnosis not present

## 2014-09-14 DIAGNOSIS — G600 Hereditary motor and sensory neuropathy: Secondary | ICD-10-CM | POA: Diagnosis not present

## 2014-09-14 DIAGNOSIS — Z8673 Personal history of transient ischemic attack (TIA), and cerebral infarction without residual deficits: Secondary | ICD-10-CM | POA: Diagnosis not present

## 2014-09-14 DIAGNOSIS — W19XXXD Unspecified fall, subsequent encounter: Secondary | ICD-10-CM | POA: Diagnosis not present

## 2014-09-14 DIAGNOSIS — M6281 Muscle weakness (generalized): Secondary | ICD-10-CM | POA: Diagnosis not present

## 2014-09-14 DIAGNOSIS — S8263XA Displaced fracture of lateral malleolus of unspecified fibula, initial encounter for closed fracture: Secondary | ICD-10-CM | POA: Diagnosis present

## 2014-09-14 HISTORY — PX: ORIF ANKLE FRACTURE: SHX5408

## 2014-09-14 HISTORY — PX: ORIF ANKLE FRACTURE: SUR919

## 2014-09-14 LAB — CBC
HCT: 37.7 % (ref 36.0–46.0)
HEMOGLOBIN: 11.9 g/dL — AB (ref 12.0–15.0)
MCH: 30 pg (ref 26.0–34.0)
MCHC: 31.6 g/dL (ref 30.0–36.0)
MCV: 95 fL (ref 78.0–100.0)
Platelets: 348 10*3/uL (ref 150–400)
RBC: 3.97 MIL/uL (ref 3.87–5.11)
RDW: 14 % (ref 11.5–15.5)
WBC: 11.3 10*3/uL — ABNORMAL HIGH (ref 4.0–10.5)

## 2014-09-14 LAB — BASIC METABOLIC PANEL
Anion gap: 13 (ref 5–15)
BUN: 14 mg/dL (ref 6–23)
CO2: 31 mmol/L (ref 19–32)
CREATININE: 1.02 mg/dL (ref 0.50–1.10)
Calcium: 9.8 mg/dL (ref 8.4–10.5)
Chloride: 95 mmol/L — ABNORMAL LOW (ref 96–112)
GFR calc Af Amer: 62 mL/min — ABNORMAL LOW (ref >90)
GFR calc non Af Amer: 54 mL/min — ABNORMAL LOW (ref >90)
GLUCOSE: 151 mg/dL — AB (ref 70–99)
Potassium: 3.3 mmol/L — ABNORMAL LOW (ref 3.5–5.1)
Sodium: 139 mmol/L (ref 135–145)

## 2014-09-14 SURGERY — OPEN REDUCTION INTERNAL FIXATION (ORIF) ANKLE FRACTURE
Anesthesia: Regional | Site: Ankle | Laterality: Right

## 2014-09-14 MED ORDER — MIDAZOLAM HCL 2 MG/2ML IJ SOLN
INTRAMUSCULAR | Status: AC
  Administered 2014-09-14: 1 mg
  Filled 2014-09-14: qty 2

## 2014-09-14 MED ORDER — PROPOFOL 10 MG/ML IV BOLUS
INTRAVENOUS | Status: AC
  Filled 2014-09-14: qty 20

## 2014-09-14 MED ORDER — KETOROLAC TROMETHAMINE 15 MG/ML IJ SOLN
INTRAMUSCULAR | Status: AC
  Filled 2014-09-14: qty 1

## 2014-09-14 MED ORDER — ONDANSETRON HCL 4 MG/2ML IJ SOLN
INTRAMUSCULAR | Status: AC
  Filled 2014-09-14: qty 2

## 2014-09-14 MED ORDER — BUPIVACAINE-EPINEPHRINE (PF) 0.5% -1:200000 IJ SOLN
INTRAMUSCULAR | Status: AC
  Filled 2014-09-14: qty 30

## 2014-09-14 MED ORDER — FENTANYL CITRATE 0.05 MG/ML IJ SOLN
INTRAMUSCULAR | Status: AC
  Filled 2014-09-14: qty 5

## 2014-09-14 MED ORDER — HYDROMORPHONE HCL 1 MG/ML IJ SOLN
INTRAMUSCULAR | Status: AC
  Filled 2014-09-14: qty 1

## 2014-09-14 MED ORDER — CALCIUM CARBONATE 1250 (500 CA) MG PO CAPS
1250.0000 mg | ORAL_CAPSULE | Freq: Every day | ORAL | Status: DC
  Filled 2014-09-14: qty 1

## 2014-09-14 MED ORDER — FENTANYL CITRATE 0.05 MG/ML IJ SOLN
INTRAMUSCULAR | Status: AC
  Administered 2014-09-14: 50 ug
  Filled 2014-09-14: qty 2

## 2014-09-14 MED ORDER — CETYLPYRIDINIUM CHLORIDE 0.05 % MT LIQD
7.0000 mL | Freq: Two times a day (BID) | OROMUCOSAL | Status: DC
  Administered 2014-09-15 – 2014-09-19 (×8): 7 mL via OROMUCOSAL

## 2014-09-14 MED ORDER — BUPIVACAINE-EPINEPHRINE (PF) 0.5% -1:200000 IJ SOLN
INTRAMUSCULAR | Status: DC | PRN
  Administered 2014-09-14: 30 mL via PERINEURAL

## 2014-09-14 MED ORDER — LACTATED RINGERS IV SOLN
INTRAVENOUS | Status: DC | PRN
  Administered 2014-09-14 (×2): via INTRAVENOUS

## 2014-09-14 MED ORDER — DOCUSATE SODIUM 100 MG PO CAPS
100.0000 mg | ORAL_CAPSULE | Freq: Two times a day (BID) | ORAL | Status: DC
  Administered 2014-09-14 – 2014-09-20 (×11): 100 mg via ORAL
  Filled 2014-09-14 (×14): qty 1

## 2014-09-14 MED ORDER — ACETAMINOPHEN 650 MG RE SUPP: 650.0000 mg | Freq: Four times a day (QID) | RECTAL | Status: DC | PRN

## 2014-09-14 MED ORDER — MAGNESIUM CITRATE PO SOLN
1.0000 | Freq: Once | ORAL | Status: AC | PRN
  Filled 2014-09-14: qty 296

## 2014-09-14 MED ORDER — LORAZEPAM 1 MG PO TABS
1.0000 mg | ORAL_TABLET | Freq: Three times a day (TID) | ORAL | Status: DC | PRN
  Administered 2014-09-15: 1 mg via ORAL
  Filled 2014-09-14: qty 1

## 2014-09-14 MED ORDER — PROMETHAZINE HCL 25 MG/ML IJ SOLN: 6.2500 mg | INTRAMUSCULAR | Status: DC | PRN

## 2014-09-14 MED ORDER — SODIUM CHLORIDE 0.9 % IV SOLN: INTRAVENOUS | Status: DC

## 2014-09-14 MED ORDER — VITAMIN B-6 50 MG PO TABS
50.0000 mg | ORAL_TABLET | Freq: Every day | ORAL | Status: DC
Start: 2014-09-15 — End: 2014-09-20
  Administered 2014-09-15 – 2014-09-20 (×5): 50 mg via ORAL
  Filled 2014-09-14 (×6): qty 1

## 2014-09-14 MED ORDER — ONDANSETRON HCL 4 MG/2ML IJ SOLN
INTRAMUSCULAR | Status: DC | PRN
  Administered 2014-09-14: 4 mg via INTRAVENOUS

## 2014-09-14 MED ORDER — FENTANYL CITRATE 0.05 MG/ML IJ SOLN
INTRAMUSCULAR | Status: DC | PRN
  Administered 2014-09-14 (×5): 50 ug via INTRAVENOUS

## 2014-09-14 MED ORDER — HYDROCHLOROTHIAZIDE 25 MG PO TABS
25.0000 mg | ORAL_TABLET | Freq: Every day | ORAL | Status: DC
Start: 2014-09-15 — End: 2014-09-18
  Administered 2014-09-15 – 2014-09-18 (×3): 25 mg via ORAL
  Filled 2014-09-14 (×4): qty 1

## 2014-09-14 MED ORDER — ENOXAPARIN SODIUM 40 MG/0.4ML ~~LOC~~ SOLN
40.0000 mg | SUBCUTANEOUS | Status: DC
  Administered 2014-09-15 – 2014-09-20 (×6): 40 mg via SUBCUTANEOUS
  Filled 2014-09-14 (×7): qty 0.4

## 2014-09-14 MED ORDER — CARBIDOPA-LEVODOPA 25-250 MG PO TABS
1.0000 | ORAL_TABLET | Freq: Three times a day (TID) | ORAL | Status: DC
  Administered 2014-09-15 – 2014-09-20 (×15): 1 via ORAL
  Filled 2014-09-14 (×21): qty 1

## 2014-09-14 MED ORDER — NYSTATIN 100000 UNIT/GM EX POWD
1.0000 | Freq: Two times a day (BID) | CUTANEOUS | Status: DC
  Administered 2014-09-16: 1 via TOPICAL
  Filled 2014-09-14 (×2): qty 15

## 2014-09-14 MED ORDER — PROPOFOL 10 MG/ML IV BOLUS
INTRAVENOUS | Status: DC | PRN
  Administered 2014-09-14: 140 mg via INTRAVENOUS
  Administered 2014-09-14: 10 mg via INTRAVENOUS

## 2014-09-14 MED ORDER — DULOXETINE HCL 60 MG PO CPEP
60.0000 mg | ORAL_CAPSULE | Freq: Every day | ORAL | Status: DC
  Administered 2014-09-15 – 2014-09-16 (×2): 60 mg via ORAL
  Filled 2014-09-14 (×3): qty 1

## 2014-09-14 MED ORDER — ASPIRIN 325 MG PO TABS
325.0000 mg | ORAL_TABLET | Freq: Every day | ORAL | Status: DC
  Administered 2014-09-15 – 2014-09-20 (×5): 325 mg via ORAL
  Filled 2014-09-14 (×6): qty 1

## 2014-09-14 MED ORDER — LACTATED RINGERS IV SOLN
INTRAVENOUS | Status: DC
  Administered 2014-09-14: 12:00:00 via INTRAVENOUS

## 2014-09-14 MED ORDER — KETOROLAC TROMETHAMINE 15 MG/ML IJ SOLN
7.5000 mg | Freq: Four times a day (QID) | INTRAMUSCULAR | Status: AC
  Administered 2014-09-14 – 2014-09-15 (×4): 7.5 mg via INTRAVENOUS
  Filled 2014-09-14 (×6): qty 1

## 2014-09-14 MED ORDER — MIRTAZAPINE 15 MG PO TABS
15.0000 mg | ORAL_TABLET | Freq: Every day | ORAL | Status: DC
  Administered 2014-09-15 – 2014-09-16 (×2): 15 mg via ORAL
  Filled 2014-09-14 (×4): qty 1

## 2014-09-14 MED ORDER — VITAMIN D3 25 MCG (1000 UNIT) PO TABS
1000.0000 [IU] | ORAL_TABLET | Freq: Every day | ORAL | Status: DC
  Administered 2014-09-15 – 2014-09-20 (×5): 1000 [IU] via ORAL
  Filled 2014-09-14 (×6): qty 1

## 2014-09-14 MED ORDER — CHLORHEXIDINE GLUCONATE 4 % EX LIQD
60.0000 mL | Freq: Once | CUTANEOUS | Status: DC
  Filled 2014-09-14: qty 60

## 2014-09-14 MED ORDER — BACITRACIN ZINC 500 UNIT/GM EX OINT
TOPICAL_OINTMENT | CUTANEOUS | Status: AC
  Filled 2014-09-14: qty 28.35

## 2014-09-14 MED ORDER — GABAPENTIN 800 MG PO TABS
800.0000 mg | ORAL_TABLET | Freq: Two times a day (BID) | ORAL | Status: DC
  Filled 2014-09-14: qty 1

## 2014-09-14 MED ORDER — MIDAZOLAM HCL 2 MG/2ML IJ SOLN
INTRAMUSCULAR | Status: AC
  Filled 2014-09-14: qty 2

## 2014-09-14 MED ORDER — MORPHINE SULFATE 2 MG/ML IJ SOLN
2.0000 mg | INTRAMUSCULAR | Status: DC | PRN
  Filled 2014-09-14: qty 1

## 2014-09-14 MED ORDER — AMLODIPINE BESYLATE 10 MG PO TABS
10.0000 mg | ORAL_TABLET | Freq: Every day | ORAL | Status: DC
  Administered 2014-09-15 – 2014-09-16 (×2): 10 mg via ORAL
  Filled 2014-09-14 (×5): qty 1

## 2014-09-14 MED ORDER — GABAPENTIN 400 MG PO CAPS
800.0000 mg | ORAL_CAPSULE | Freq: Two times a day (BID) | ORAL | Status: DC
  Administered 2014-09-14 – 2014-09-15 (×3): 800 mg via ORAL
  Filled 2014-09-14 (×3): qty 2

## 2014-09-14 MED ORDER — ONDANSETRON HCL 4 MG PO TABS
4.0000 mg | ORAL_TABLET | Freq: Four times a day (QID) | ORAL | Status: DC | PRN
  Administered 2014-09-15: 4 mg via ORAL
  Filled 2014-09-14: qty 1

## 2014-09-14 MED ORDER — HYDROMORPHONE HCL 1 MG/ML IJ SOLN
0.2500 mg | INTRAMUSCULAR | Status: DC | PRN
  Administered 2014-09-14 (×3): 0.5 mg via INTRAVENOUS

## 2014-09-14 MED ORDER — SENNA 8.6 MG PO TABS
2.0000 | ORAL_TABLET | Freq: Two times a day (BID) | ORAL | Status: DC
  Administered 2014-09-15 – 2014-09-20 (×10): 17.2 mg via ORAL
  Filled 2014-09-14 (×15): qty 2

## 2014-09-14 MED ORDER — ONDANSETRON HCL 4 MG/2ML IJ SOLN: 4.0000 mg | Freq: Four times a day (QID) | INTRAMUSCULAR | Status: DC | PRN

## 2014-09-14 MED ORDER — FLUTICASONE PROPIONATE 50 MCG/ACT NA SUSP
1.0000 | Freq: Every day | NASAL | Status: DC | PRN
  Administered 2014-09-15: 1 via NASAL
  Filled 2014-09-14: qty 16

## 2014-09-14 MED ORDER — CALCIUM CARBONATE 1250 (500 CA) MG PO TABS
1250.0000 mg | ORAL_TABLET | Freq: Every day | ORAL | Status: DC
  Administered 2014-09-14 – 2014-09-19 (×6): 1250 mg via ORAL
  Filled 2014-09-14 (×7): qty 1

## 2014-09-14 MED ORDER — BISACODYL 10 MG RE SUPP
10.0000 mg | Freq: Every day | RECTAL | Status: DC | PRN
  Administered 2014-09-19: 10 mg via RECTAL
  Filled 2014-09-14: qty 1

## 2014-09-14 MED ORDER — ACETAMINOPHEN 325 MG PO TABS
650.0000 mg | ORAL_TABLET | Freq: Four times a day (QID) | ORAL | Status: DC | PRN
  Administered 2014-09-15 – 2014-09-19 (×7): 650 mg via ORAL
  Filled 2014-09-14 (×10): qty 2

## 2014-09-14 MED ORDER — OXYCODONE HCL 5 MG PO TABS
5.0000 mg | ORAL_TABLET | ORAL | Status: DC | PRN
  Administered 2014-09-15 (×2): 10 mg via ORAL
  Administered 2014-09-15: 5 mg via ORAL
  Administered 2014-09-15 – 2014-09-17 (×9): 10 mg via ORAL
  Filled 2014-09-14 (×13): qty 2

## 2014-09-14 MED ORDER — CEFAZOLIN SODIUM-DEXTROSE 2-3 GM-% IV SOLR
2.0000 g | INTRAVENOUS | Status: AC
  Administered 2014-09-14: 2 g via INTRAVENOUS
  Filled 2014-09-14: qty 50

## 2014-09-14 MED ORDER — ATORVASTATIN CALCIUM 20 MG PO TABS
20.0000 mg | ORAL_TABLET | Freq: Every day | ORAL | Status: DC
  Administered 2014-09-15 – 2014-09-16 (×2): 20 mg via ORAL
  Filled 2014-09-14 (×3): qty 1

## 2014-09-14 MED ORDER — 0.9 % SODIUM CHLORIDE (POUR BTL) OPTIME
TOPICAL | Status: DC | PRN
  Administered 2014-09-14: 1000 mL

## 2014-09-14 MED ORDER — CARBIDOPA-LEVODOPA ER 50-200 MG PO TBCR
1.0000 | EXTENDED_RELEASE_TABLET | Freq: Every day | ORAL | Status: DC
  Administered 2014-09-14 – 2014-09-19 (×7): 1 via ORAL
  Filled 2014-09-14 (×8): qty 1

## 2014-09-14 SURGICAL SUPPLY — 73 items
BANDAGE ESMARK 6X9 LF (GAUZE/BANDAGES/DRESSINGS) ×1 IMPLANT
BIT DRILL 2.5X2.75 QC CALB (BIT) ×2 IMPLANT
BIT DRILL 3.5X5.5 QC CALB (BIT) ×2 IMPLANT
BLADE SURG 15 STRL LF DISP TIS (BLADE) ×1 IMPLANT
BLADE SURG 15 STRL SS (BLADE) ×3
BNDG CMPR 9X6 STRL LF SNTH (GAUZE/BANDAGES/DRESSINGS) ×1
BNDG COHESIVE 4X5 TAN STRL (GAUZE/BANDAGES/DRESSINGS) ×3 IMPLANT
BNDG COHESIVE 6X5 TAN STRL LF (GAUZE/BANDAGES/DRESSINGS) ×3 IMPLANT
BNDG ESMARK 6X9 LF (GAUZE/BANDAGES/DRESSINGS) ×3
CANISTER SUCT 3000ML PPV (MISCELLANEOUS) ×3 IMPLANT
CHLORAPREP W/TINT 26ML (MISCELLANEOUS) ×3 IMPLANT
COVER SURGICAL LIGHT HANDLE (MISCELLANEOUS) ×3 IMPLANT
CUFF TOURNIQUET SINGLE 34IN LL (TOURNIQUET CUFF) ×3 IMPLANT
CUFF TOURNIQUET SINGLE 44IN (TOURNIQUET CUFF) IMPLANT
DRAPE OEC MINIVIEW 54X84 (DRAPES) ×3 IMPLANT
DRAPE U-SHAPE 47X51 STRL (DRAPES) ×3 IMPLANT
DRSG ADAPTIC 3X8 NADH LF (GAUZE/BANDAGES/DRESSINGS) IMPLANT
DRSG MEPITEL 3X4 ME34 (GAUZE/BANDAGES/DRESSINGS) ×2 IMPLANT
DRSG MEPITEL 4X7.2 (GAUZE/BANDAGES/DRESSINGS) ×2 IMPLANT
DRSG PAD ABDOMINAL 8X10 ST (GAUZE/BANDAGES/DRESSINGS) ×6 IMPLANT
ELECT REM PT RETURN 9FT ADLT (ELECTROSURGICAL) ×3
ELECTRODE REM PT RTRN 9FT ADLT (ELECTROSURGICAL) ×1 IMPLANT
FIXATION ZIPTIGHT ANKLE SNDSMS (Ankle) IMPLANT
GAUZE SPONGE 4X4 12PLY STRL (GAUZE/BANDAGES/DRESSINGS) IMPLANT
GLOVE BIO SURGEON STRL SZ7 (GLOVE) ×6 IMPLANT
GLOVE BIO SURGEON STRL SZ8 (GLOVE) ×3 IMPLANT
GLOVE BIOGEL PI IND STRL 7.5 (GLOVE) ×1 IMPLANT
GLOVE BIOGEL PI IND STRL 8 (GLOVE) ×1 IMPLANT
GLOVE BIOGEL PI INDICATOR 7.5 (GLOVE) ×2
GLOVE BIOGEL PI INDICATOR 8 (GLOVE) ×2
GLOVE SURG SS PI 8.0 STRL IVOR (GLOVE) ×4 IMPLANT
GOWN STRL REUS W/ TWL LRG LVL3 (GOWN DISPOSABLE) ×2 IMPLANT
GOWN STRL REUS W/ TWL XL LVL3 (GOWN DISPOSABLE) ×1 IMPLANT
GOWN STRL REUS W/TWL LRG LVL3 (GOWN DISPOSABLE) ×6
GOWN STRL REUS W/TWL XL LVL3 (GOWN DISPOSABLE) ×3
K-WIRE ACE 1.6X6 (WIRE) ×3
KIT BASIN OR (CUSTOM PROCEDURE TRAY) ×3 IMPLANT
KIT ROOM TURNOVER OR (KITS) ×3 IMPLANT
KWIRE ACE 1.6X6 (WIRE) IMPLANT
NEEDLE 22X1 1/2 (OR ONLY) (NEEDLE) IMPLANT
NS IRRIG 1000ML POUR BTL (IV SOLUTION) ×3 IMPLANT
PACK ORTHO EXTREMITY (CUSTOM PROCEDURE TRAY) ×3 IMPLANT
PAD ARMBOARD 7.5X6 YLW CONV (MISCELLANEOUS) ×6 IMPLANT
PAD CAST 4YDX4 CTTN HI CHSV (CAST SUPPLIES) ×1 IMPLANT
PADDING CAST COTTON 4X4 STRL (CAST SUPPLIES) ×3
PADDING CAST SYN 6 (CAST SUPPLIES) ×2
PADDING CAST SYNTHETIC 6X4 NS (CAST SUPPLIES) IMPLANT
PLATE ACE 100DE 10H (Plate) ×2 IMPLANT
PLATE LOCK COMP 10H 3.5 FOOT (Plate) ×2 IMPLANT
SCREW CORTICAL 3.5MM  12MM (Screw) ×6 IMPLANT
SCREW CORTICAL 3.5MM  16MM (Screw) ×2 IMPLANT
SCREW CORTICAL 3.5MM  20MM (Screw) ×2 IMPLANT
SCREW CORTICAL 3.5MM 12MM (Screw) IMPLANT
SCREW CORTICAL 3.5MM 14MM (Screw) ×6 IMPLANT
SCREW CORTICAL 3.5MM 16MM (Screw) IMPLANT
SCREW CORTICAL 3.5MM 20MM (Screw) IMPLANT
SPONGE LAP 18X18 X RAY DECT (DISPOSABLE) ×3 IMPLANT
STAPLER VISISTAT 35W (STAPLE) IMPLANT
SUCTION FRAZIER TIP 10 FR DISP (SUCTIONS) ×3 IMPLANT
SUT ETHILON 3 0 PS 1 (SUTURE) ×2 IMPLANT
SUT MNCRL AB 3-0 PS2 18 (SUTURE) IMPLANT
SUT PROLENE 3 0 PS 2 (SUTURE) ×3 IMPLANT
SUT VIC AB 2-0 CT1 27 (SUTURE) ×6
SUT VIC AB 2-0 CT1 TAPERPNT 27 (SUTURE) ×2 IMPLANT
SUT VIC AB 3-0 PS2 18 (SUTURE) ×3
SUT VIC AB 3-0 PS2 18XBRD (SUTURE) ×1 IMPLANT
SYR CONTROL 10ML LL (SYRINGE) IMPLANT
TOWEL OR 17X24 6PK STRL BLUE (TOWEL DISPOSABLE) ×3 IMPLANT
TOWEL OR 17X26 10 PK STRL BLUE (TOWEL DISPOSABLE) ×3 IMPLANT
TUBE CONNECTING 12'X1/4 (SUCTIONS) ×1
TUBE CONNECTING 12X1/4 (SUCTIONS) ×2 IMPLANT
WATER STERILE IRR 1000ML POUR (IV SOLUTION) ×3 IMPLANT
ZIPTIGHT ANKLE SYNODESMOSS FIX (Ankle) ×6 IMPLANT

## 2014-09-14 NOTE — Progress Notes (Signed)
   09/14/14 1058  OBSTRUCTIVE SLEEP APNEA  Have you ever been diagnosed with sleep apnea through a sleep study? No  Do you snore loudly (loud enough to be heard through closed doors)?  1  Do you often feel tired, fatigued, or sleepy during the daytime? 1  Has anyone observed you stop breathing during your sleep? 0  Do you have, or are you being treated for high blood pressure? 1  BMI more than 35 kg/m2? 1  Age over 73 years old? 1  Neck circumference greater than 40 cm/16 inches? 0  Gender: 0

## 2014-09-14 NOTE — Progress Notes (Signed)
Orthopedic Tech Progress Note Patient Details:  [name] [birth year] [id number]  Casting Cast Location: rle Cast Intervention: Removal     Hildred Priest 09/14/2014, 1:25 PM

## 2014-09-14 NOTE — Anesthesia Procedure Notes (Addendum)
Anesthesia Regional Block:  Popliteal block  Pre-Anesthetic Checklist: ,, timeout performed, Correct Patient, Correct Site, Correct Laterality, Correct Procedure, Correct Position, site marked, Risks and benefits discussed,  Surgical consent,  Pre-op evaluation,  At surgeon's request and post-op pain management  Laterality: Right  Prep: chloraprep       Needles:  Injection technique: Single-shot  Needle Type: Echogenic Stimulator Needle     Needle Length: 9cm 9 cm Needle Gauge: 21 and 21 G    Additional Needles:  Procedures: ultrasound guided (picture in chart) and nerve stimulator Popliteal block  Nerve Stimulator or Paresthesia:  Response: tibial, 0.5 mA,  Response: peroneal, 0.6 mA,   Additional Responses:   Narrative:  Start time: 09/14/2014 12:00 PM End time: 09/14/2014 12:13 PM Injection made incrementally with aspirations every 5 mL.  Performed by: Personally  Anesthesiologist: Suzette Battiest  Additional Notes: Risks and benefits discussed. Pt tolerated well with no immediate complications.   Procedure Name: LMA Insertion Date/Time: 09/14/2014 1:41 PM Performed by: Manus Gunning, Sharonica Kraszewski J Pre-anesthesia Checklist: Patient identified, Timeout performed, Emergency Drugs available, Suction available and Patient being monitored Patient Re-evaluated:Patient Re-evaluated prior to inductionOxygen Delivery Method: Circle system utilized Preoxygenation: Pre-oxygenation with 100% oxygen Intubation Type: IV induction Ventilation: Mask ventilation without difficulty LMA: LMA inserted Number of attempts: 1 Placement Confirmation: positive ETCO2 and breath sounds checked- equal and bilateral Tube secured with: Tape Dental Injury: Teeth and Oropharynx as per pre-operative assessment

## 2014-09-14 NOTE — Brief Op Note (Signed)
09/14/2014  3:37 PM  PATIENT:  [name]  74 y.o. female  PRE-OPERATIVE DIAGNOSIS: 1.  Right ankle lateral malleolus fracture (Weber C)      2.  Right ankle syndesmosis disruption  POST-OPERATIVE DIAGNOSIS:  Same  Procedure(s): 1.  ORIF right ankle lateral malleolus fracture   2.  ORIF right ankle syndesmosis   3.  AP, mortise and lateral xrays of the right ankle  SURGEON:  Wylene Simmer, MD  ASSISTANT: n/a  ANESTHESIA:   General, regional  EBL:  minimal   TOURNIQUET:   Total Tourniquet Time Documented: Thigh (Right) - 74 minutes Total: Thigh (Right) - 74 minutes  COMPLICATIONS:  None apparent  DISPOSITION:  Extubated, awake and stable to recovery.  DICTATION ID:  [id number]

## 2014-09-14 NOTE — Anesthesia Postprocedure Evaluation (Signed)
  Anesthesia Post-op Note  Patient: [name]  Procedure(s) Performed: Procedure(s): OPEN REDUCTION INTERNAL FIXATION (ORIF) RIGHT ANKLE FRACTURE/SYNDESMOSIS  (Right)  Patient Location: PACU  Anesthesia Type:General  Level of Consciousness: awake  Airway and Oxygen Therapy: Patient Spontanous Breathing  Post-op Pain: mild  Post-op Assessment: Post-op Vital signs reviewed  Post-op Vital Signs: Reviewed  Last Vitals:  Filed Vitals:   09/14/14 1545  BP: 136/33  Pulse: 102  Temp:   Resp: 11    Complications: No apparent anesthesia complications

## 2014-09-14 NOTE — Progress Notes (Signed)
Orthopedic Tech Progress Note Patient Details:  [name] [birth year] [id number]  Patient ID: [name], female   DOB: 02-26-1941, 74 y.o.   MRN: [id number] As ordered by Dr. Joesph Fillers, [name] 09/14/2014, 1:25 PM

## 2014-09-14 NOTE — Anesthesia Preprocedure Evaluation (Addendum)
Anesthesia Evaluation  Patient identified by MRN, date of birth, ID band Patient awake    Reviewed: Allergy & Precautions, NPO status , Patient's Chart, lab work & pertinent test results  Airway Mallampati: III  TM Distance: >3 FB Neck ROM: Full    Dental   Pulmonary neg pulmonary ROS,  breath sounds clear to auscultation        Cardiovascular hypertension, Pt. on medications + Peripheral Vascular Disease Rhythm:Regular Rate:Normal  2007 Myoview- no evidence ischemia  2013 TTE: EF 60-65%, LVH   Neuro/Psych  Headaches, Depression TIACVA    GI/Hepatic Neg liver ROS, GERD-  ,  Endo/Other  Morbid obesity  Renal/GU negative Renal ROS     Musculoskeletal  (+) Arthritis -,   Abdominal   Peds  Hematology negative hematology ROS (+)   Anesthesia Other Findings   Reproductive/Obstetrics                           Anesthesia Physical Anesthesia Plan  ASA: III  Anesthesia Plan: General and Regional   Post-op Pain Management:    Induction: Intravenous  Airway Management Planned: LMA  Additional Equipment:   Intra-op Plan:   Post-operative Plan: Extubation in OR  Informed Consent: I have reviewed the patients History and Physical, chart, labs and discussed the procedure including the risks, benefits and alternatives for the proposed anesthesia with the patient or authorized representative who has indicated his/her understanding and acceptance.   Dental advisory given  Plan Discussed with: CRNA  Anesthesia Plan Comments:         Anesthesia Quick Evaluation

## 2014-09-14 NOTE — Progress Notes (Signed)
Pt admitted to the unit at 1746. Pt mental status is alert and oriented. Pt oriented to room, staff, and call bell. Skin is intact with cast/coban dressing to RLE Full assessment charted in [facility]. Call bell within reach. Visitor guidelines reviewed w/ pt.

## 2014-09-14 NOTE — Transfer of Care (Signed)
Immediate Anesthesia Transfer of Care Note  Patient: [name]  Procedure(s) Performed: Procedure(s): OPEN REDUCTION INTERNAL FIXATION (ORIF) RIGHT ANKLE FRACTURE/SYNDESMOSIS  (Right)  Patient Location: PACU  Anesthesia Type:General  Level of Consciousness: awake and alert   Airway & Oxygen Therapy: Patient Spontanous Breathing and Patient connected to nasal cannula oxygen  Post-op Assessment: Report given to RN and Post -op Vital signs reviewed and stable  Post vital signs: Reviewed and stable  Last Vitals:  Filed Vitals:   09/14/14 1248  BP: 118/40  Pulse: 108  Temp:   Resp: 12    Complications: No apparent anesthesia complications

## 2014-09-14 NOTE — H&P (Signed)
[name] is an 75 y.o. female.   Chief Complaint: right ankle pain HPI: 74 y/o female without significant pMH fell about a month ago injuring her right ankle.  She has a displaced right Weber C fracture with syndesmosis disruption.  She presents now for ORIf.  Past Medical History  Diagnosis Date  . Hypertension   . GERD (gastroesophageal reflux disease)     hx pud '98  . Arthritis     djd  . Restless leg syndrome   . 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 Dysrhythmia     hx tachy palpitations  . Headache(784.0)     remote hx of migraines  . Neuromuscular disorder     peripheral neuropathy, FEET AND LEGS  . Depression     bh admission '09  . Stroke     ? 6 months ago - tests okay - [facility]   . Cellulitis     10/11/11 hospitalized for cellulitis   . Hyperlipemia   . Peripheral neuropathy   . Elbow fracture, left April 2015    Past Surgical History  Procedure Laterality Date  . Cervical disc surgery x2  2011  . Rt carotid enarterectomy  2011  . Rt knee arthroscopy  '99  . Left knee arthroscopy  2001  . Hematoma evacuation  2011    s/p rt cea  . Joint replacement  '01    total knee replacement, LEFT  . Abdominal hysterectomy  1975    bil oophorectomy  . Back surgery   MANY YRS AGO    laminectomy x3  . Cholecystectomy  1993  . Total knee arthroplasty  09/13/2011    Procedure: TOTAL KNEE ARTHROPLASTY;  Surgeon: Magnus Sinning, MD;  Location: [facility];  Service: Orthopedics;  Laterality: Right;  . Right knee replacement      09/2011   . Carpal tunnel release  07/09/2012    Procedure: CARPAL TUNNEL RELEASE;  Surgeon: Magnus Sinning, MD;  Location: [facility];  Service: Orthopedics;  Laterality: Left;  . Finger arthroplasty  07/09/2012    Procedure: FINGER ARTHROPLASTY;  Surgeon: Magnus Sinning, MD;  Location: [facility];  Service: Orthopedics;  Laterality: Left;  Interposition Arthroplasty [facility] Joint Thumb Left   . Lipoma excision  07/09/2012    Procedure:  EXCISION LIPOMA;  Surgeon: Magnus Sinning, MD;  Location: [facility];  Service: Orthopedics;  Laterality: Left;  Excision Lipoma Dorsum Left Wrist     Family History  Problem Relation Age of Onset  . Congestive Heart Failure Father   . Congestive Heart Failure Sister   . Alzheimer's disease Mother   . Diabetes Brother   . Arthritis Brother    Social History:  reports that she has never smoked. She has never used smokeless tobacco. She reports that she does not drink alcohol or use illicit drugs.  Allergies:  Allergies  Allergen Reactions  . Sulfa Drugs Cross Reactors Nausea And Vomiting    Medications Prior to Admission  Medication Sig Dispense Refill  . amLODipine (NORVASC) 10 MG tablet Take 1 tablet by mouth at  bedtime 90 tablet 1  . aspirin 325 MG tablet Take 325 mg by mouth daily.    Marland Kitchen atorvastatin (LIPITOR) 20 MG tablet Take 1 tablet by mouth at  bedtime 90 tablet 1  . calcium carbonate 1250 MG capsule Take 1,250 mg by mouth daily.     . carbidopa-levodopa (SINEMET CR) 50-200 MG  per tablet Take 1 tablet by mouth at bedtime. 90 tablet 3  . carbidopa-levodopa (SINEMET IR) 25-250 MG per tablet Take one tablet by mouth three times daily 270 tablet 3  . cholecalciferol (VITAMIN D) 1000 UNITS tablet Take 1,000 Units by mouth daily.    . DULoxetine (CYMBALTA) 60 MG capsule Take one capsule by mouth twice daily for depression 60 capsule 1  . gabapentin (NEURONTIN) 800 MG tablet Take 1 tablet by mouth 4  times daily 360 tablet 1  . hydrochlorothiazide (HYDRODIURIL) 25 MG tablet Take 1 tablet by mouth in  the morning 90 tablet 1  . LORazepam (ATIVAN) 1 MG tablet Take 1 tablet (1 mg total) by mouth every 8 (eight) hours as needed for anxiety. 90 tablet 0  . nystatin (MYCOSTATIN/NYSTOP) 100000 UNIT/GM POWD Apply beneath left breast for yeast rash (Patient taking differently: Apply 1 Bottle topically daily as needed. Apply beneath left breast for yeast rash) 15 g 1  . oxyCODONE-acetaminophen  (PERCOCET/ROXICET) 5-325 MG per tablet Take 1 tablet by mouth every 6 (six) hours as needed for severe pain. 15 tablet 0  . Phenylephrine HCl (AFRIN ALLERGY NA) Place 2 sprays into the nose daily as needed (Nasal congestion).    . pyridOXINE (VITAMIN B-6) 50 MG tablet Take 50 mg by mouth daily.    . temazepam (RESTORIL) 15 MG capsule Take 1 capsule (15 mg total) by mouth at bedtime as needed for sleep. 30 capsule 0  . amoxicillin (AMOXIL) 500 MG capsule Take 2,000 mg by mouth See admin instructions. Must take 4 capsules 1 hour before dental procedures    . fluticasone (FLONASE) 50 MCG/ACT nasal spray Place 1 spray into both nostrils daily as needed for allergies or rhinitis.    Marland Kitchen HYDROcodone-acetaminophen (NORCO/VICODIN) 5-325 MG per tablet Take 1 tablet by mouth every 6 (six) hours as needed (MUST LAST 28 DAYS). 90 tablet 0  . meclizine (ANTIVERT) 25 MG tablet Take one tablet by mouth once daily as needed for dizziness. (Patient taking differently: Take 25 mg by mouth 2 (two) times daily as needed for dizziness. ) 30 tablet 0  . mirtazapine (REMERON) 15 MG tablet Take one tablet by mouth every night at bedtime (Patient not taking: Reported on 09/13/2014) 90 tablet 3  . SUMAtriptan (IMITREX) 50 MG tablet See AUX Label (Patient taking differently: TAKE ONE TABLET AS NEEDED FOR MIGRAINES) 30 tablet 1    Results for orders placed or performed during the hospital encounter of 09/14/14 (from the past 48 hour(s))  Basic metabolic panel     Status: Abnormal   Collection Time: 09/14/14 11:37 AM  Result Value Ref Range   Sodium 139 135 - 145 mmol/L   Potassium 3.3 (L) 3.5 - 5.1 mmol/L   Chloride 95 (L) 96 - 112 mmol/L   CO2 31 19 - 32 mmol/L   Glucose, Bld 151 (H) 70 - 99 mg/dL   BUN 14 6 - 23 mg/dL   Creatinine, Ser 1.02 0.50 - 1.10 mg/dL   Calcium 9.8 8.4 - 10.5 mg/dL   GFR calc non Af Amer 54 (L) >90 mL/min   GFR calc Af Amer 62 (L) >90 mL/min    Comment: (NOTE) The eGFR has been calculated using  the CKD EPI equation. This calculation has not been validated in all clinical situations. eGFR's persistently <90 mL/min signify possible Chronic Kidney Disease.    Anion gap 13 5 - 15  CBC     Status: Abnormal   Collection Time:  Oct 01, 2014 12:34 PM  Result Value Ref Range   WBC 11.3 (H) 4.0 - 10.5 K/uL   RBC 3.97 3.87 - 5.11 MIL/uL   Hemoglobin 11.9 (L) 12.0 - 15.0 g/dL   HCT 37.7 36.0 - 46.0 %   MCV 95.0 78.0 - 100.0 fL   MCH 30.0 26.0 - 34.0 pg   MCHC 31.6 30.0 - 36.0 g/dL   RDW 14.0 11.5 - 15.5 %   Platelets 348 150 - 400 K/uL   No results found.  ROS  No recent f/c/n/v/wt loss  Blood pressure 118/40, pulse 108, temperature 99.1 F (37.3 C), temperature source Oral, resp. rate 12, height 5' 5"  (9.937 m), weight 99.791 kg (220 lb), SpO2 96 %. Physical Exam  wn wd woman in nad.  A and Ox 4.  Mood and affect normal.  EOMI.  resp unlabored.  R ankle with healthy skin and minimal sweling.  No lymphadenopathy.  Decreased sens to LT distally.  5/5 strength in PF and DF of the ankle and toes.  Xrays:  Displaced Weber C fracture of the lateral malleolus and disruption of the syndesmosis.  Assessment/Plan R ankle weber C lateral malleolus fracture and syndesmosis disruption - to OR for ORIF.  The risks and benefits of the alternative treatment options have been discussed in detail.  The patient wishes to proceed with surgery and specifically understands risks of bleeding, infection, nerve damage, blood clots, need for additional surgery, amputation and death.   Wylene Simmer 01-Oct-2014, 1:09 PM

## 2014-09-15 MED ORDER — METHOCARBAMOL 500 MG PO TABS
500.0000 mg | ORAL_TABLET | Freq: Two times a day (BID) | ORAL | Status: DC | PRN
  Administered 2014-09-15: 500 mg via ORAL
  Filled 2014-09-15 (×2): qty 1

## 2014-09-15 MED ORDER — GABAPENTIN 400 MG PO CAPS
800.0000 mg | ORAL_CAPSULE | Freq: Two times a day (BID) | ORAL | Status: DC
  Administered 2014-09-15 – 2014-09-16 (×3): 800 mg via ORAL
  Filled 2014-09-15 (×5): qty 2

## 2014-09-15 NOTE — Progress Notes (Signed)
Subjective: 1 Day Post-Op Procedure(s) (LRB): OPEN REDUCTION INTERNAL FIXATION (ORIF) RIGHT ANKLE FRACTURE/SYNDESMOSIS  (Right) Patient reports pain as moderate.  No n/v.  Tolerating regular diet.  No BM yet.  Awaiting SW consult. Objective: Vital signs in last 24 hours: Temp:  [97.7 F (36.5 C)-100.2 F (37.9 C)] 100 F (37.8 C) (04/13 1000) Pulse Rate:  [89-121] 112 (04/13 1000) Resp:  [8-16] 16 (04/13 1000) BP: (108-142)/(33-62) 108/56 mmHg (04/13 1000) SpO2:  [90 %-100 %] 96 % (04/13 1000)  Intake/Output from previous day: 04/12 0701 - 04/13 0700 In: 1440 [P.O.:240; I.V.:1200] Out: 570 [Urine:550; Blood:20] Intake/Output this shift: Total I/O In: 120 [P.O.:120] Out: 350 [Urine:350]   Recent Labs  09/14/14 1234  HGB 11.9*    Recent Labs  09/14/14 1234  WBC 11.3*  RBC 3.97  HCT 37.7  PLT 348    Recent Labs  09/14/14 1137  NA 139  K 3.3*  CL 95*  CO2 31  BUN 14  CREATININE 1.02  GLUCOSE 151*  CALCIUM 9.8   No results for input(s): LABPT, INR in the last 72 hours.  PE:  R ankle splinted.  NVI at [place].  Assessment/Plan: 1 Day Post-Op Procedure(s) (LRB): OPEN REDUCTION INTERNAL FIXATION (ORIF) RIGHT ANKLE FRACTURE/SYNDESMOSIS  (Right) Up with therapy  NWB on R LE.  Continue PT.  SW for [employer] placement.  Wylene Simmer 09/15/2014, 1:08 PM

## 2014-09-15 NOTE — Progress Notes (Signed)
Patient c/o muscle spasms in her right leg at sx site called MD Britt Bottom on call waiting for return call

## 2014-09-15 NOTE — Progress Notes (Addendum)
Instructed pt on how to IS, told pt to use 10 times every 2hrs while awake. Pt about to get to 1250 on IS. Pt stated she knew how to use. Put ice pack on pt's rt foot. Ranelle Oyster, RN

## 2014-09-15 NOTE — Progress Notes (Signed)
MD notified due to acute change in patient status. Vital signs and labs are listed below.  MD notified(1st page) Time of 1st page:  Dr. Rolena Infante Responding MD:  Dr. Rolena Infante Time MD responded: 2210 MD response: MD stated aware, gave order for IS and apply ice to rt ankle  Vital Signs Filed Vitals:   09/15/14 0059 09/15/14 0522 09/15/14 1000 09/15/14 2145  BP: 140/55 141/56 108/56 151/64  Pulse: 110 121 112 131  Temp: 98.8 F (37.1 C) 100.2 F (37.9 C) 100 F (37.8 C) 101.8 F (38.8 C)  TempSrc: Oral Oral Oral Oral  Resp: 16 16 16 18   Height:      Weight:      SpO2: 90% 95% 96% 94%     Lab Results WBC  Date/Time Value Ref Range Status  09/14/2014 12:34 PM 11.3* 4.0 - 10.5 K/uL Final  08/07/2014 06:14 AM 10.1 4.0 - 10.5 K/uL Final  01/11/2014 03:14 PM 12.4* 3.4 - 10.8 x10E3/uL Final  07/09/2012 12:50 PM 11.5* 4.0 - 10.5 K/uL Final    Comment:    COUNT MAY BE INACCURATE DUE TO FIBRIN CLUMPS.   NEUTROPHILS RELATIVE %  Date/Time Value Ref Range Status  08/07/2014 06:14 AM 66 43 - 77 % Final  01/11/2014 03:14 PM 72 % Final  10/11/2011 04:05 PM 62 43 - 77 % Final   No results found for: PCO2ART No results found for: LATICACIDVEN No results found for: Leana Gamer, RN 09/15/2014, 10:10 PM

## 2014-09-15 NOTE — Clinical Social Work Placement (Addendum)
Clinical Social Work Department CLINICAL SOCIAL WORK PLACEMENT NOTE 09/15/2014  Patient:  [name], [name]  Account Number:  [id number] Admit date:  09/14/2014  Clinical Social Worker:  Barbette Or, LCSW  Date/time:  09/15/2014 02:30 PM  Clinical Social Work is seeking post-discharge placement for this patient at the following level of care:   SKILLED NURSING   (*CSW will update this form in Epic as items are completed)   09/15/2014  Patient/family provided with [facility] Department of Clinical Social Work's list of facilities offering this level of care within the geographic area requested by the patient (or if unable, by the patient's family).  09/15/2014  Patient/family informed of their freedom to choose among providers that offer the needed level of care, that participate in Medicare, Medicaid or managed care program needed by the patient, have an available bed and are willing to accept the patient.  09/15/2014  Patient/family informed of [facility]' ownership interest in [facility], as well as of the fact that they are under no obligation to receive care at this facility.  PASARR submitted to EDS on 09/15/2014 PASARR number received on 09/15/2014  FL2 transmitted to all facilities in geographic area requested by pt/family on  09/15/2014 FL2 transmitted to all facilities within larger geographic area on   Patient informed that his/her managed care company has contracts with or will negotiate with  certain facilities, including the following:     Patient/family informed of bed offers received:  09/15/2014 Patient chooses bed at [facility] Physician recommends and patient chooses bed at    Patient to be transferred to [facility] on 09/17/2014  Patient to be transferred to facility by [facility] Patient and family notified of transfer on 09/18/2014  Name of family member notified:  Allyn Kenner  The following physician request were entered in  Epic:   Additional Comments:  [place], LCSW

## 2014-09-15 NOTE — Evaluation (Signed)
Physical Therapy Evaluation Patient Details Name: [name] MRN: [id number] DOB: 02-23-1941 Today's Date: 09/15/2014   History of Present Illness  74 y/o female without significant pMH fell about a month ago injuring her right ankle. She has a displaced right Weber C fracture with syndesmosis disruption s/p ORIF  Clinical Impression  Pt admitted with/for internal fixation of R ankle fx.  Pt currently limited functionally due to the problems listed. ( See problems list.)   Pt will benefit from PT to maximize function and safety in order to get ready for next venue listed below.     Follow Up Recommendations SNF    Equipment Recommendations  Other (comment) (TBA next venue)    Recommendations for Other Services       Precautions / Restrictions Precautions Precautions: Fall Restrictions Weight Bearing Restrictions: Yes      Mobility  Bed Mobility Overal bed mobility: Needs Assistance Bed Mobility: Supine to Sit     Supine to sit: Supervision     General bed mobility comments: Needed extra time.  Transfers Overall transfer level: Needs assistance   Transfers: Sit to/from Stand;Stand Pivot Transfers Sit to Stand: Min assist Stand pivot transfers: Min assist       General transfer comment: difficulty maintaining NWB on the R LE  Ambulation/Gait             General Gait Details: pt unable to do swing to gait and maintain RLE NWB at this time.  Stairs            Wheelchair Mobility    Modified Rankin (Stroke Patients Only)       Balance Overall balance assessment: Needs assistance   Sitting balance-Leahy Scale: Good       Standing balance-Leahy Scale: Poor                               Pertinent Vitals/Pain Pain Assessment: Faces Faces Pain Scale: Hurts little more Pain Descriptors / Indicators: Throbbing Pain Intervention(s): Premedicated before session    Home Living Family/patient expects to be discharged to::  Skilled nursing facility Living Arrangements: Alone Available Help at Discharge: Friend(s);Other (Comment) (2 sons PRN) Type of Home: House Home Access: Other (comment) (1 little threshold step)     Home Layout: One level Home Equipment: Walker - 2 wheels;Walker - 4 wheels (getting lift chair)      Prior Function Level of Independence: Independent               Hand Dominance        Extremity/Trunk Assessment               Lower Extremity Assessment: LLE deficits/detail;RLE deficits/detail RLE Deficits / Details: can move against gravity. LLE Deficits / Details: wfl     Communication   Communication: No difficulties  Cognition Arousal/Alertness: Awake/alert Behavior During Therapy: WFL for tasks assessed/performed Overall Cognitive Status: Within Functional Limits for tasks assessed                      General Comments      Exercises        Assessment/Plan    PT Assessment Patient needs continued PT services  PT Diagnosis Difficulty walking;Acute pain   PT Problem List Decreased strength;Decreased activity tolerance;Decreased balance;Decreased mobility;Decreased knowledge of use of DME;Pain  PT Treatment Interventions DME instruction;Gait training;Functional mobility training;Therapeutic activities;Therapeutic exercise;Balance training;Patient/family education   PT Goals (Current  goals can be found in the Care Plan section) Acute Rehab PT Goals Patient Stated Goal: go to rehab and then home PT Goal Formulation: With patient Time For Goal Achievement: 09/15/14    Frequency Min 3X/week   Barriers to discharge        Co-evaluation               End of Session   Activity Tolerance: Patient tolerated treatment well Patient left: in chair;with call bell/phone within reach Nurse Communication: Mobility status         Time: 0017-4944 PT Time Calculation (min) (ACUTE ONLY): 34 min   Charges:   PT Evaluation $Initial PT  Evaluation Tier I: 1 Procedure PT Treatments $Therapeutic Activity: 8-22 mins   PT G Codes:        [name], Tessie Fass 09/15/2014, 1:46 PM 09/15/2014  Donnella Sham, PT [phone]  (pager)

## 2014-09-15 NOTE — Clinical Social Work Note (Signed)
Clinical Social Work Department BRIEF PSYCHOSOCIAL ASSESSMENT 09/15/2014  Patient:  [name], [name]     Account Number:  [id number]     Admit date:  09/14/2014  Clinical Social Worker:  Myles Lipps  Date/Time:  09/15/2014 02:30 PM  Referred by:  Physician  Date Referred:  09/15/2014 Referred for  SNF Placement   Other Referral:   Interview type:  Patient Other interview type:   No family/friends at bedside    PSYCHOSOCIAL DATA Living Status:  ALONE Admitted from facility:   Level of care:   Primary support name:  Donovan Kail  [phone] Primary support relationship to patient:  CHILD, ADULT Degree of support available:   Strong and supportive    CURRENT CONCERNS Current Concerns  Post-Acute Placement   Other Concerns:    SOCIAL WORK ASSESSMENT / PLAN Clinical Social Worker met with patient at bedside to offer support and discuss patient needs at discharge.  Patient states that she lives at home alone and was fell out of the bathtub and onto the floor.  Patient was able to use her lifeline to call EMS for assistance.  Patient was able to go and visit [facility] prior to surgery, however did not make prior arrangements.  CSW initiated referral to [facility] who was able to extend the bed offer and will come to the hospital to complete paperwork with patient prior to admission.  Patient in agreement and will notify family of her discharge plans.  CSW remains available for support and to facilitate patient discharge needs once medically ready.   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    PATIENT'S/FAMILY'S RESPONSE TO PLAN OF CARE: Patient alert and oriented x3 laying in bed upon CSW arrival.  CSW awakened patient upon arrival.   Patient initial reaction was guarded, however once awake very willing to engage in assessment process.  Patient with supportive family who plan to provide transportation to patient at discharge.  Patient understanding and appreciative of CSW role.

## 2014-09-15 NOTE — Op Note (Signed)
[name], [name] NO.:  [id number]  MEDICAL RECORD NO.:  [id number]  LOCATION:  5W19C                        FACILITY:  [facility]  PHYSICIAN:  Wylene Simmer, MD        DATE OF BIRTH:  Mar 20, 1941  DATE OF PROCEDURE:  09/14/2014 DATE OF DISCHARGE:                              OPERATIVE REPORT   PREOPERATIVE DIAGNOSES: 1. Right ankle lateral malleolus fracture (Weber C). 2. Right ankle syndesmosis disruption.  POSTOPERATIVE DIAGNOSES: 1. Right ankle lateral malleolus fracture (Weber C). 2. Right ankle syndesmosis disruption.  PROCEDURE: 1. Open reduction and internal fixation of right ankle lateral     malleolus fracture. 2. Open reduction and internal fixation of right ankle syndesmosis     disruption. 3. AP, mortise, and lateral radiographs of the right ankle.  SURGEON:  Wylene Simmer, MD.  ANESTHESIA:  General, regional.  ESTIMATED BLOOD LOSS:  Minimal.  TOURNIQUET TIME:  74 minutes at 350 mmHg.  COMPLICATIONS:  None apparent.  DISPOSITION:  Extubated, awake, and stable to recovery.  INDICATIONS FOR PROCEDURE:  The patient is a 74 year old woman who injured her ankle a month ago.  She initially desired nonoperative management.  She then was noted to have displacement of the fracture progressive valgus deformity.  She was then sent to me for further evaluation and treatment of this injury.  On my examination, she had a Weber C fracture of the lateral malleolus that was displaced and shortened as well as displacement through the syndesmosis.  She presents now for operative treatment of this displaced unstable ankle fracture and syndesmosis disruption.  She understands the risks and benefits, the alternative treatment options, and elects surgical treatment.  She specifically understands risks of bleeding, infection, nerve damage, blood clots, need for additional surgery, continued pain, amputation, and death.  PROCEDURE IN DETAIL:  After preoperative  consent was obtained and the correct operative site was identified, the patient was brought to the operating room and placed supine on the operating table.  General anesthesia was induced.  Preoperative antibiotics were administered. Surgical time-out was taken.  The right lower extremity was prepped and draped in standard sterile fashion with a tourniquet around the thigh. The extremity was exsanguinated and tourniquet was inflated to 350 mmHg. A longitudinal incision was then made over the lateral malleolus.  Sharp dissection was carried down through the skin and subcutaneous tissue. The fracture site was identified.  It was cleaned of all fibrous tissue and early callus formation.  It was mobilized appropriately.  It was reduced and held with a lobster claw.  A 3.5-mm fully-threaded lag screw was inserted from anterior to posterior across the fracture site. Unfortunately, this did not hold due to poor bone quality.  A 10-hole one-third tubular plate was then applied to the lateral aspect of the fibula.  It was secured distally with 3 unicortical screws.  It was then used as a reduction aid to pull the fracture out to length.  Distal fibula was then pinned to the distal tibia to help maintain the appropriate length.  The lobster claw was used to secure the plate to the proximal fibular fragment.  Plate was then secured to the proximal  fibula using 4 bicortical screws.  AP and lateral radiographs showed appropriate restoration of the length of the fibula and appropriate reduction of the fracture site.  The syndesmosis was still noted to be widened.  A King-Tong clamp was placed through a stab incision at the medial malleolus and then into the distal screw hole at the fibula.  The clamp was tightened reducing the syndesmosis appropriately.  AP and lateral radiographs confirmed appropriate reduction of the syndesmosis. Two Biomet zip tight suture devices were passed across the  syndesmosis and all 4 cortices of the distal fibula and tibia.  Both were tightened after toggling of the medial portion of the implant.  AP, lateral, and mortise radiographs were obtained showing appropriate position and length of all hardware and appropriate alignment of the fracture and syndesmosis.  The wounds were irrigated copiously.  Horizontal mattress sutures of 3-0 nylon were used to close the medial incision.  The lateral incision was closed with 2-0 Vicryl at the deep subcutaneous layer, 3-0 Monocryl in superficial subcutaneous layer, and 3-0 nylon at the skin.  Sterile dressings were applied followed by a well-padded short-leg splint.  Tourniquet was released at 74 minutes after application of the dressings.  The patient was awakened from anesthesia and transported to the recovery room in stable condition.  FOLLOWUP PLAN:  The patient will be admitted to the hospital for physical therapy for this inpatient only procedure.  She will likely need placement in a skilled nursing facility for acute rehab since she lives alone and will have difficulty maintaining a nonweightbearing status.  She will start aspirin for DVT prophylaxis.  X-RAYS:  Three views weightbearing of the right ankle were obtained intraoperatively today.  These show interval reduction of the lateral malleolus fracture and syndesmosis disruption with internal fixation of both injuries.  No other acute injuries noted.     Wylene Simmer, MD     JH/MEDQ  D:  09/14/2014  T:  09/15/2014  Job:  [id number]

## 2014-09-15 NOTE — Progress Notes (Signed)
Utilization review completed.  

## 2014-09-15 NOTE — Progress Notes (Signed)
Dr. Melina Schools called back ordered Robaxin 500 mg po for her muscle spasms

## 2014-09-16 ENCOUNTER — Encounter (HOSPITAL_COMMUNITY): Payer: Self-pay | Admitting: Orthopedic Surgery

## 2014-09-16 MED ORDER — SENNA 8.6 MG PO TABS: 2.0000 | ORAL_TABLET | Freq: Two times a day (BID) | ORAL | Status: DC

## 2014-09-16 MED ORDER — GABAPENTIN 800 MG PO TABS: ORAL_TABLET | ORAL | Status: DC

## 2014-09-16 MED ORDER — DOCUSATE SODIUM 100 MG PO CAPS: 100.0000 mg | ORAL_CAPSULE | Freq: Two times a day (BID) | ORAL | Status: DC

## 2014-09-16 MED ORDER — GABAPENTIN 400 MG PO CAPS
800.0000 mg | ORAL_CAPSULE | Freq: Three times a day (TID) | ORAL | Status: DC
  Administered 2014-09-16: 800 mg via ORAL
  Filled 2014-09-16 (×6): qty 2

## 2014-09-16 MED ORDER — OXYCODONE HCL 5 MG PO TABS: 5.0000 mg | ORAL_TABLET | ORAL | Status: DC | PRN

## 2014-09-16 MED ORDER — CARBIDOPA-LEVODOPA 25-250 MG PO TABS: ORAL_TABLET | ORAL | Status: DC

## 2014-09-16 MED ORDER — ACETAMINOPHEN 325 MG PO TABS: 650.0000 mg | ORAL_TABLET | Freq: Four times a day (QID) | ORAL | Status: DC | PRN

## 2014-09-16 NOTE — Clinical Social Work Note (Signed)
Clinical Social Worker continuing to follow patient for support and discharge planning needs.  Patient has a bed at [facility] with plans for discharge tomorrow if medically ready.  Facility aware and agreeable.  CSW remains available for support and to facilitate patient discharge needs.  Barbette Or, [place]

## 2014-09-16 NOTE — Discharge Instructions (Signed)
Isabella Simmer, MD [place]  Please read the following information regarding your care after surgery.  Medications  You only need a prescription for the narcotic pain medicine (ex. oxycodone, Percocet, Norco).  All of the other medicines listed below are available over the counter. X acetominophen (Tylenol) 650 mg every 4-6 hours as you need for minor pain X oxycodone as prescribed for moderate to severe pain  Narcotic pain medicine (ex. oxycodone, Percocet, Vicodin) will cause constipation.  To prevent this problem, take the following medicines while you are taking any pain medicine. X docusate sodium (Colace) 100 mg twice a day X senna (Senokot) 2 tablets twice a day  X To help prevent blood clots, take an aspirin (325 mg) once a day for a month after surgery.  You should also get up every hour while you are awake to move around.    Weight Bearing ? Bear weight when you are able on your operated leg or foot. ? Bear weight only on the heel of your operated foot in the post-op shoe. X Do not bear any weight on the operated leg or foot.  Cast / Splint / Dressing X Keep your splint or cast clean and dry.  Dont put anything (coat hanger, pencil, etc) down inside of it.  If it gets damp, use a hair dryer on the cool setting to dry it.  If it gets soaked, call the office to schedule an appointment for a cast change. ? Remove your dressing 3 days after surgery and cover the incisions with dry dressings.    After your dressing, cast or splint is removed; you may shower, but do not soak or scrub the wound.  Allow the water to run over it, and then gently pat it dry.  Swelling It is normal for you to have swelling where you had surgery.  To reduce swelling and pain, keep your toes above your nose for at least 3 days after surgery.  It may be necessary to keep your foot or leg elevated for several weeks.  If it hurts, it should be elevated.  Follow Up Call my office at [phone]  when you are discharged from the hospital or surgery center to schedule an appointment to be seen two weeks after surgery.  Call my office at [phone] if you develop a fever >101.5 F, nausea, vomiting, bleeding from the surgical site or severe pain.

## 2014-09-16 NOTE — Discharge Summary (Addendum)
Physician Discharge Summary  Patient ID: [name] MRN: [id number] DOB/AGE: 02-21-1941 74 y.o.  Admit date: 09/14/2014 Discharge date: 09/20/2014  Admission Diagnoses:  Htn, depression, obesity, L ankle fracture  Discharge Diagnoses:  Principal Problem:   Ankle fracture, lateral malleolus, closed Active Problems:   Fall   HTN (hypertension)   Restless legs syndrome (RLS)   Hyperlipemia   Severe obesity (BMI >= 40)   Major depressive disorder, recurrent severe without psychotic features   Acute respiratory failure   Acute encephalopathy   Hypokalemia   Constipation same as above S/p ORIF L ankle fracture  Discharged Condition: stable  Hospital Course: The pt was admitted on 4/12 and was taken to the OR for ORIF of her left ankle fracture and syndesmosis disruption.  She tolerated the procedure well and was transferred to 5W.  She did well with PT.  On POD 3 she developed hypoxemia and was quite lethargic.  Spiral chest CT was neg for PE.  The hospitalist service was consulted, and ultimately it was determined that she had respiratory depression from medications and likely undiagnosed sleep apnea.  After her medications were adjusted she did well the remainder of her hospital stay.  [name] discharged to SNF in stable condition for continued PT and OT.  [name] NWB on the L LE.  She will need to f/u with her PCP in the next few weeks to see if she can restart her BP meds.  She will need a sleep study in the near future.  BMP should be re-checked to follow her recent episode of hypokalemia.  Consults: [facility]  Significant Diagnostic Studies: none  Treatments: surgery: as above  Discharge Exam: Blood pressure 140/73, pulse 92, temperature 97.8 F (36.6 C), temperature source Oral, resp. rate 20, height 5\' 5"  (1.651 m), weight 99.791 kg (220 lb), SpO2 98 %. WN WD woman in nad.  A and O x 4.  Mood and affect normal.  EOMI.  resp unlabored.  L ankle splinted.  Sens to LT  diminshed at the forefoot.  brisk cap refill at the forefoot.  No lymphadenopathy.  Disposition: to [facility].      Discharge Instructions    Call MD / Call 911    Complete by:  As directed   If you experience chest pain or shortness of breath, CALL 911 and be transported to the hospital emergency room.  If you develope a fever above 101 F, pus (white drainage) or increased drainage or redness at the wound, or calf pain, call your surgeon's office.     Constipation Prevention    Complete by:  As directed   Drink plenty of fluids.  Prune juice may be helpful.  You may use a stool softener, such as Colace (over the counter) 100 mg twice a day.  Use MiraLax (over the counter) for constipation as needed.     Diet - low sodium heart healthy    Complete by:  As directed      Increase activity slowly as tolerated    Complete by:  As directed      Non weight bearing    Complete by:  As directed   Laterality:  left  Extremity:  Lower            Medication List    STOP taking these medications        amLODipine 10 MG tablet  Commonly known as:  NORVASC     gabapentin 800 MG tablet  Commonly known as:  NEURONTIN  Replaced by:  gabapentin 400 MG capsule     HYDROcodone-acetaminophen 5-325 MG per tablet  Commonly known as:  NORCO/VICODIN     oxyCODONE-acetaminophen 5-325 MG per tablet  Commonly known as:  PERCOCET/ROXICET      TAKE these medications        acetaminophen 325 MG tablet  Commonly known as:  TYLENOL  Take 2 tablets (650 mg total) by mouth every 6 (six) hours as needed for mild pain (or Fever >/= 101).     AFRIN ALLERGY NA  Place 2 sprays into the nose daily as needed (Nasal congestion).     amoxicillin 500 MG capsule  Commonly known as:  AMOXIL  Take 2,000 mg by mouth See admin instructions. Must take 4 capsules 1 hour before dental procedures     aspirin 325 MG tablet  Take 325 mg by mouth daily.     atorvastatin 20 MG tablet  Commonly known as:   LIPITOR  Take 1 tablet by mouth at  bedtime     calcium carbonate 1250 MG capsule  Take 1,250 mg by mouth daily.     carbidopa-levodopa 50-200 MG per tablet  Commonly known as:  SINEMET CR  Take 1 tablet by mouth at bedtime.     carbidopa-levodopa 25-250 MG per tablet  Commonly known as:  SINEMET IR  Take one tablet by mouth three times daily with meals     cholecalciferol 1000 UNITS tablet  Commonly known as:  VITAMIN D  Take 1,000 Units by mouth daily.     docusate sodium 100 MG capsule  Commonly known as:  COLACE  Take 1 capsule (100 mg total) by mouth 2 (two) times daily.     DULoxetine 60 MG capsule  Commonly known as:  CYMBALTA  Take one capsule by mouth twice daily for depression     fluticasone 50 MCG/ACT nasal spray  Commonly known as:  FLONASE  Place 1 spray into both nostrils daily as needed for allergies or rhinitis.     gabapentin 400 MG capsule  Commonly known as:  NEURONTIN  Take 1 capsule (400 mg total) by mouth 2 (two) times daily.     hydrochlorothiazide 25 MG tablet  Commonly known as:  HYDRODIURIL  Take 1 tablet by mouth in  the morning     LORazepam 1 MG tablet  Commonly known as:  ATIVAN  Take 1 tablet (1 mg total) by mouth every 8 (eight) hours as needed for anxiety.     meclizine 25 MG tablet  Commonly known as:  ANTIVERT  Take one tablet by mouth once daily as needed for dizziness.     mirtazapine 15 MG tablet  Commonly known as:  REMERON  Take one tablet by mouth every night at bedtime     nystatin 100000 UNIT/GM Powd  Apply beneath left breast for yeast rash     oxyCODONE 5 MG immediate release tablet  Commonly known as:  Oxy IR/ROXICODONE  Take 1 tablet (5 mg total) by mouth every 6 (six) hours as needed for moderate pain or severe pain.     pyridOXINE 50 MG tablet  Commonly known as:  VITAMIN B-6  Take 50 mg by mouth daily.     senna 8.6 MG Tabs tablet  Commonly known as:  SENOKOT  Take 2 tablets (17.2 mg total) by mouth 2  (two) times daily.     SUMAtriptan 50 MG tablet  Commonly known as:  IMITREX  See AUX Label     temazepam 7.5 MG capsule  Commonly known as:  RESTORIL  Take 1 capsule (7.5 mg total) by mouth at bedtime as needed for sleep.       Follow-up Information    Follow up with Cambell Stanek, Jenny Reichmann, MD. Schedule an appointment as soon as possible for a visit in 2 weeks.   Specialty:  Orthopedic Surgery   Contact information:   [address] [place] [address] [phone]       Follow up with [name], [name], DO In 2 weeks.   Specialty:  Geriatric Medicine   Contact information:   [place] [address] [phone]       Signed: Wylene Simmer 09/20/2014, 7:46 AM

## 2014-09-16 NOTE — Care Management Note (Addendum)
    Page 1 of 1   09/20/2014     11:55:56 AM CARE MANAGEMENT NOTE 09/20/2014  Patient:  [name], [name]   Account Number:  [id number]  Date Initiated:  09/16/2014  Documentation initiated by:  Carles Collet  Subjective/Objective Assessment:   surgical fixation of ankle fracture. from home alone     Action/Plan:   will follow for discharge needs   Anticipated DC Date:  09/16/2014   Anticipated DC Plan:  SKILLED NURSING FACILITY  In-house referral  Clinical Social Worker      DC Planning Services  CM consult      Choice offered to / List presented to:             Status of service:  Completed, signed off Medicare Important Message given?  YES (If response is "NO", the following Medicare IM given date fields will be blank) Date Medicare IM given:  09/17/2014 Medicare IM given by:  Tomi Bamberger Date Additional Medicare IM given:  09/20/2014 Additional Medicare IM given by:  [facility] SWIST  Discharge Disposition:  [facility]  Per UR Regulation:  Reviewed for med. necessity/level of care/duration of stay  If discussed at [place] of Stay Meetings, dates discussed:    Comments:  09-20-14 Pt to be discharge to SNF IM letter given. Carles Collet RN BSN CM  09-16-14 Pt to be discharged to SNF.No other needs.  Carles Collet RN BSN CM

## 2014-09-17 ENCOUNTER — Other Ambulatory Visit: Payer: Self-pay

## 2014-09-17 ENCOUNTER — Inpatient Hospital Stay (HOSPITAL_COMMUNITY): Payer: Medicare Other

## 2014-09-17 ENCOUNTER — Encounter (HOSPITAL_COMMUNITY): Payer: Self-pay

## 2014-09-17 DIAGNOSIS — F332 Major depressive disorder, recurrent severe without psychotic features: Secondary | ICD-10-CM

## 2014-09-17 DIAGNOSIS — J9601 Acute respiratory failure with hypoxia: Secondary | ICD-10-CM | POA: Clinically undetermined

## 2014-09-17 DIAGNOSIS — J9600 Acute respiratory failure, unspecified whether with hypoxia or hypercapnia: Secondary | ICD-10-CM | POA: Clinically undetermined

## 2014-09-17 DIAGNOSIS — G4733 Obstructive sleep apnea (adult) (pediatric): Secondary | ICD-10-CM | POA: Insufficient documentation

## 2014-09-17 DIAGNOSIS — S8262XA Displaced fracture of lateral malleolus of left fibula, initial encounter for closed fracture: Secondary | ICD-10-CM

## 2014-09-17 DIAGNOSIS — W19XXXD Unspecified fall, subsequent encounter: Secondary | ICD-10-CM

## 2014-09-17 LAB — BLOOD GAS, ARTERIAL
Acid-Base Excess: 10.3 mmol/L — ABNORMAL HIGH (ref 0.0–2.0)
Bicarbonate: 35.2 mEq/L — ABNORMAL HIGH (ref 20.0–24.0)
Drawn by: 331001
O2 CONTENT: 3 L/min
O2 SAT: 95.1 %
PATIENT TEMPERATURE: 98.6
TCO2: 36.9 mmol/L (ref 0–100)
pCO2 arterial: 55.5 mmHg — ABNORMAL HIGH (ref 35.0–45.0)
pH, Arterial: 7.418 (ref 7.350–7.450)
pO2, Arterial: 76.2 mmHg — ABNORMAL LOW (ref 80.0–100.0)

## 2014-09-17 LAB — BASIC METABOLIC PANEL
Anion gap: 13 (ref 5–15)
BUN: 14 mg/dL (ref 6–23)
CHLORIDE: 92 mmol/L — AB (ref 96–112)
CO2: 31 mmol/L (ref 19–32)
CREATININE: 1.17 mg/dL — AB (ref 0.50–1.10)
Calcium: 9.3 mg/dL (ref 8.4–10.5)
GFR calc Af Amer: 53 mL/min — ABNORMAL LOW (ref >90)
GFR calc non Af Amer: 45 mL/min — ABNORMAL LOW (ref >90)
GLUCOSE: 166 mg/dL — AB (ref 70–99)
POTASSIUM: 3.1 mmol/L — AB (ref 3.5–5.1)
Sodium: 136 mmol/L (ref 135–145)

## 2014-09-17 LAB — GLUCOSE, CAPILLARY: Glucose-Capillary: 121 mg/dL — ABNORMAL HIGH (ref 70–99)

## 2014-09-17 LAB — CBC
HCT: 33.5 % — ABNORMAL LOW (ref 36.0–46.0)
Hemoglobin: 10.4 g/dL — ABNORMAL LOW (ref 12.0–15.0)
MCH: 30.2 pg (ref 26.0–34.0)
MCHC: 31 g/dL (ref 30.0–36.0)
MCV: 97.4 fL (ref 78.0–100.0)
Platelets: 266 10*3/uL (ref 150–400)
RBC: 3.44 MIL/uL — ABNORMAL LOW (ref 3.87–5.11)
RDW: 14.2 % (ref 11.5–15.5)
WBC: 10.7 10*3/uL — ABNORMAL HIGH (ref 4.0–10.5)

## 2014-09-17 LAB — D-DIMER, QUANTITATIVE: D-Dimer, Quant: 0.9 ug/mL-FEU — ABNORMAL HIGH (ref 0.00–0.48)

## 2014-09-17 MED ORDER — KETOROLAC TROMETHAMINE 15 MG/ML IJ SOLN: 15.0000 mg | Freq: Four times a day (QID) | INTRAMUSCULAR | Status: DC | PRN

## 2014-09-17 MED ORDER — LORAZEPAM 0.5 MG PO TABS
0.5000 mg | ORAL_TABLET | Freq: Two times a day (BID) | ORAL | Status: DC
  Administered 2014-09-17 – 2014-09-18 (×2): 0.5 mg via ORAL
  Filled 2014-09-17 (×2): qty 1

## 2014-09-17 MED ORDER — IOHEXOL 350 MG/ML SOLN
80.0000 mL | Freq: Once | INTRAVENOUS | Status: AC | PRN
Start: 2014-09-17 — End: 2014-09-17
  Administered 2014-09-17: 80 mL via INTRAVENOUS

## 2014-09-17 MED ORDER — LORAZEPAM 1 MG PO TABS: 1.0000 mg | ORAL_TABLET | Freq: Three times a day (TID) | ORAL | Status: DC | PRN

## 2014-09-17 MED ORDER — DULOXETINE HCL 30 MG PO CPEP
30.0000 mg | ORAL_CAPSULE | Freq: Every day | ORAL | Status: DC
  Administered 2014-09-18: 30 mg via ORAL
  Filled 2014-09-17: qty 1

## 2014-09-17 MED ORDER — OXYCODONE-ACETAMINOPHEN 5-325 MG PO TABS
1.0000 | ORAL_TABLET | Freq: Four times a day (QID) | ORAL | Status: DC | PRN
  Administered 2014-09-17 – 2014-09-20 (×8): 1 via ORAL
  Filled 2014-09-17 (×9): qty 1

## 2014-09-17 NOTE — Progress Notes (Signed)
Sent my coworker in to give pt her scheduled morning medications around 0945. At that time, pt was found to be lethargic and hard to awake. Pt's VS were taken and are stable. CBG was taken and was found to be 121. Pt already on oxygen. Paged provider [place] as well as rapid response. Rapid Response nurse Langley Gauss to bedside to assess pt. VS taken. Respiratory called to do at STAT ABG. Dr. Verlon Au to bedside to assess pt. ABG read to provider via telephone.Per provider, pt to be placed on continuous pulse ox and will notify RN of any other changes. No further orders given at this time.

## 2014-09-17 NOTE — Progress Notes (Addendum)
PT Cancellation Note  Patient Details Name: [name] MRN: [id number] DOB: 03-Apr-1941   Cancelled Treatment:    Reason Eval/Treat Not Completed: Other (comment) (AMS with some agitation and not able to participate with therapy.  Will try back as able and appropriate, otherwise will see Monday if pt has not already discharged. 09/17/2014  [name], [name] [phone]  (pager)   [name], [name] 09/17/2014, 1:29 PM

## 2014-09-17 NOTE — Significant Event (Signed)
Rapid Response Event Note  Overview: Time Called: 1025 Arrival Time: 1025 Event Type: Other (Comment)  Initial Focused Assessment: On floor doing rounds when asked by RN to evaluate patient.  On my arrival to patients room, Patient lying in bed, room dark on nasal cannula, snoring.  Patient will not arouse to voice however when do slight sternal rub, patient states "to leave her alone, I want to sleep" also states she did not sleep last night,  and slightly aggressive when trying to assess.  Will not follow commands, moves all extremities to pain.     Interventions:  Last time received any pain meds was at 0245 a, opened blinds.  VSS, RN paged and updated MD prior to my arrival, RN states MD to consult hospitalist.   Event Summary:   RN to call if assistance needed   at      at          [facility], Harlin Rain

## 2014-09-17 NOTE — Telephone Encounter (Signed)
Rx faxed to [facility] @ [phone], phone number [phone]  

## 2014-09-17 NOTE — Progress Notes (Signed)
Went in to give pt her requested pain medication per shift report to find pt resting peacefully.

## 2014-09-17 NOTE — Progress Notes (Signed)
Pt placed on continuous pulse ox sating 100% on 3L. Report given to new nurse.

## 2014-09-17 NOTE — Progress Notes (Addendum)
[facility] Medical Consultation  [name] GBT:[id number] DOB: 12-23-1940 DOA: 09/14/2014 PCP: Hollace Kinnier, DO   Requesting physician: dr. Wylene Simmer Date of consultation: 11:44 AM 09/17/14 Reason for consultation: Metabolic encephalopathy  Impression/Recommendations Principal Problem:   Acute respiratory failure in a setting of potential OSA [undiagnosed]-likely secondary to multiple polypharmacy issues--she is on multiple serotonergic medications she was started on slightly increased dose of oxycodone and she was also started on Robaxin a muscle relaxant that also potentiates the use of her other medications. I've looked over her medication list and downward adjuasted multiple meds including Cymbalta 60-->30 bid D/c Remeron  Lorazepam i mg tid-->0.5 q12  Keep Sinemet which I feel is reasonable for her RLS Please note that she does not have Parkinson's She will need a cautious reintroduction of her SSRIs and other medications subsequently For now I would avoid IV pain medications and use Toradol and NSAIDs at this time.  I will follow her up again a little later today to see if she awakens--as her O2 sat is in the 90s on 2-3 L of oxygen I feel she is stable to stay on the floor and we will follow as consulted. If something changes, we will involve CCM in her care as she is refusing to be placed on anything more than a nasal cannula I doubt that she will require intubation Serious causes for shortness of breath such as pulmonary embolism have been ruled out For completion sake I will get a stat EKG and follow-up on this  Once again I will reevaluate this patient later today  ctive Problems:   Fall   TIA (transient ischemic attack)   HTN (hypertension)   Restless legs syndrome (RLS)   Hyperlipemia   Severe obesity (BMI >= 40)   Major depressive disorder, recurrent severe without psychotic features   Ankle fracture, lateral malleolus, closed   OSA (obstructive sleep  apnea)     Chief Complaint: c above  HPI:  74 y/o ? h/o Htn, RLS-? Tachy palp, ? CVA, chronic LBP foll Dr. Ripley Fraise on home dosage oxycodone 5/325 every 6 when necessary], significant of depression [Prior OD on antidepressants in 2009 hospitalized for this-prior to admission is on Restoril 15, gabapentin 800 4 times a day, lorazepam 1 every 8 hourly, Cymbalta 60 twice a day], Periph Neuropathy + RLS chronically on Sinemet 25-250 3 times a day + 50 daily at bedtime admitted to the service of Dr. Wylene Simmer of orthopedics because of a displaced Weber C fracture with syndesmosis disruption ~ 1 month prior to admission 09/14/14 Underwent successful ORIF, 09/15/14, EBL minimal and awakened spontaneously subsequent to that--subsequently started on aspirin for DVT prophylaxis Was on Dilaudid which was discontinued 4/12 and placed on Toradol in favor of this 4/13 Note the patient had been receiving some methocarbamol starting on 4/13 and was also given a slight increase on her pain medications OxyIR 5-->10 every 4 when necessary which it appears she's been taking pretty scheduled during hospital stay.   Patient was noted to have desaturations of oxygenation 4/15 a.m. around 2 AM and did not sleep well. Her O2 sat on pulse ox was 70. Patient was somewhat arousable early in the morning of 4/15 according to nurse tech and was sitting up in bed and discussing appropriately. It appears that the last dose of OxyIR was 2:50 AM. She received Robaxin X one dose 2100 4/14, she received lorazepam 1 mg 4/14 at 1800  Nursing reports when she got into  the room at around 9 patient was sleepy but not really arousable. Rapid response was called and patient stated to the rapid response nurse "leave me alone I just want to be left alone" "I'm tired" ABG done this a.m. Shows pH 7.41, PCO2 55, PaO2 76 on 3 L of oxygen  On my initial evaluation of this patient, she is responsive and opens her eyes but then falls  right back to sleep. [name] snoring at the bedside and I'm not able to examine her mouth but her tongue occludes most of the oropharynx. When she is taken off her oxygen, her O2 sat drops to the mid 80s She does not really allow me to examine her and swats me away and tries to threaten me S1-S2 no murmur rub or gallop I cannot examine the back of her chest Abdomen soft nontender nondistended Reflexes are 2/3 bilaterally    Review of Systems:  Unobtainable ROS as sleepy  Past Medical History  Diagnosis Date  . Hypertension   . GERD (gastroesophageal reflux disease)     hx pud '98  . Arthritis     djd  . Restless leg syndrome   . 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 Dysrhythmia     hx tachy palpitations  . Headache(784.0)     remote hx of migraines  . Neuromuscular disorder     peripheral neuropathy, FEET AND LEGS  . Depression     bh admission '09  . Stroke     ? 6 months ago - tests okay - [facility]   . Cellulitis     10/11/11 hospitalized for cellulitis   . Hyperlipemia   . Peripheral neuropathy   . Elbow fracture, left April 2015   Past Surgical History  Procedure Laterality Date  . Cervical disc surgery x2  2011  . Rt carotid enarterectomy  2011  . Rt knee arthroscopy  '99  . Left knee arthroscopy  2001  . Hematoma evacuation  2011    s/p rt cea  . Joint replacement  '01    total knee replacement, LEFT  . Abdominal hysterectomy  1975    bil oophorectomy  . Back surgery   MANY YRS AGO    laminectomy x3  . Cholecystectomy  1993  . Total knee arthroplasty  09/13/2011    Procedure: TOTAL KNEE ARTHROPLASTY;  Surgeon: Magnus Sinning, MD;  Location: [facility];  Service: Orthopedics;  Laterality: Right;  . Right knee replacement      09/2011   . Carpal tunnel release  07/09/2012    Procedure: CARPAL TUNNEL RELEASE;  Surgeon: Magnus Sinning, MD;  Location: [facility];  Service: Orthopedics;  Laterality: Left;  . Finger arthroplasty  07/09/2012    Procedure:  FINGER ARTHROPLASTY;  Surgeon: Magnus Sinning, MD;  Location: [facility];  Service: Orthopedics;  Laterality: Left;  Interposition Arthroplasty [facility] Joint Thumb Left   . Lipoma excision  07/09/2012    Procedure: EXCISION LIPOMA;  Surgeon: Magnus Sinning, MD;  Location: [facility];  Service: Orthopedics;  Laterality: Left;  Excision Lipoma Dorsum Left Wrist   . Orif ankle fracture Right 09/14/2014    FIBULA   . Orif ankle fracture Right 09/14/2014    Procedure: OPEN REDUCTION INTERNAL FIXATION (ORIF) RIGHT ANKLE FRACTURE/SYNDESMOSIS ;  Surgeon: Wylene Simmer, MD;  Location: [facility];  Service: Orthopedics;  Laterality: Right;   Social History:  reports that she has never smoked.  She has never used smokeless tobacco. She reports that she does not drink alcohol or use illicit drugs.  Allergies  Allergen Reactions  . Sulfa Drugs Cross Reactors Nausea And Vomiting   Family History  Problem Relation Age of Onset  . Congestive Heart Failure Father   . Congestive Heart Failure Sister   . Alzheimer's disease Mother   . Diabetes Brother   . Arthritis Brother     Prior to Admission medications   Medication Sig Start Date End Date Taking? Authorizing Provider  amLODipine (NORVASC) 10 MG tablet Take 1 tablet by mouth at  bedtime 07/20/14  Yes [name] L [name], DO  aspirin 325 MG tablet Take 325 mg by mouth daily.   Yes Historical Provider, MD  atorvastatin (LIPITOR) 20 MG tablet Take 1 tablet by mouth at  bedtime 06/21/14  Yes [name] L [name], DO  calcium carbonate 1250 MG capsule Take 1,250 mg by mouth daily.    Yes Historical Provider, MD  carbidopa-levodopa (SINEMET CR) 50-200 MG per tablet Take 1 tablet by mouth at bedtime. 09/02/14  Yes [name] L [name], DO  cholecalciferol (VITAMIN D) 1000 UNITS tablet Take 1,000 Units by mouth daily.   Yes Historical Provider, MD  DULoxetine (CYMBALTA) 60 MG capsule Take one capsule by mouth twice daily for depression 09/08/14  Yes Estill Dooms, MD  hydrochlorothiazide  (HYDRODIURIL) 25 MG tablet Take 1 tablet by mouth in  the morning 07/20/14  Yes [name] L [name], DO  LORazepam (ATIVAN) 1 MG tablet Take 1 tablet (1 mg total) by mouth every 8 (eight) hours as needed for anxiety. 09/06/14  Yes Lauree Chandler, NP  nystatin (MYCOSTATIN/NYSTOP) 100000 UNIT/GM POWD Apply beneath left breast for yeast rash Patient taking differently: Apply 1 Bottle topically daily as needed. Apply beneath left breast for yeast rash 01/11/14  Yes [name] L [name], DO  oxyCODONE-acetaminophen (PERCOCET/ROXICET) 5-325 MG per tablet Take 1 tablet by mouth every 6 (six) hours as needed for severe pain. 08/21/14  Yes Merryl Hacker, MD  Phenylephrine HCl (AFRIN ALLERGY NA) Place 2 sprays into the nose daily as needed (Nasal congestion).   Yes Historical Provider, MD  pyridOXINE (VITAMIN B-6) 50 MG tablet Take 50 mg by mouth daily.   Yes Historical Provider, MD  temazepam (RESTORIL) 15 MG capsule Take 1 capsule (15 mg total) by mouth at bedtime as needed for sleep. 09/06/14  Yes Lauree Chandler, NP  acetaminophen (TYLENOL) 325 MG tablet Take 2 tablets (650 mg total) by mouth every 6 (six) hours as needed for mild pain (or Fever >/= 101). 09/16/14   Wylene Simmer, MD  amoxicillin (AMOXIL) 500 MG capsule Take 2,000 mg by mouth See admin instructions. Must take 4 capsules 1 hour before dental procedures    Historical Provider, MD  carbidopa-levodopa (SINEMET IR) 25-250 MG per tablet Take one tablet by mouth three times daily with meals 09/16/14   Wylene Simmer, MD  docusate sodium (COLACE) 100 MG capsule Take 1 capsule (100 mg total) by mouth 2 (two) times daily. 09/16/14   Wylene Simmer, MD  fluticasone (FLONASE) 50 MCG/ACT nasal spray Place 1 spray into both nostrils daily as needed for allergies or rhinitis.    Historical Provider, MD  gabapentin (NEURONTIN) 800 MG tablet Take 1 tablet by mouth 4  times daily with meals 09/16/14   Wylene Simmer, MD  HYDROcodone-acetaminophen (NORCO/VICODIN) 5-325 MG per tablet  Take 1 tablet by mouth every 6 (six) hours as needed (MUST LAST 28 DAYS).  08/18/14   Kathrynn Ducking, MD  meclizine (ANTIVERT) 25 MG tablet Take one tablet by mouth once daily as needed for dizziness. Patient taking differently: Take 25 mg by mouth 2 (two) times daily as needed for dizziness.  05/26/14   [name] L [name], DO  mirtazapine (REMERON) 15 MG tablet Take one tablet by mouth every night at bedtime Patient not taking: Reported on 09/13/2014 06/10/14   [name] L [name], DO  oxyCODONE (OXY IR/ROXICODONE) 5 MG immediate release tablet Take 1-2 tablets (5-10 mg total) by mouth every 4 (four) hours as needed for moderate pain or severe pain. 09/16/14   Wylene Simmer, MD  senna (SENOKOT) 8.6 MG TABS tablet Take 2 tablets (17.2 mg total) by mouth 2 (two) times daily. 09/16/14   Wylene Simmer, MD  SUMAtriptan (IMITREX) 50 MG tablet See AUX Label Patient taking differently: TAKE ONE TABLET AS NEEDED FOR MIGRAINES 07/20/14   [name], DO   Physical Exam: Blood pressure 105/49, pulse 91, temperature 98.5 F (36.9 C), temperature source Oral, resp. rate 8, height 5\' 5"  (1.651 m), weight 99.791 kg (220 lb), SpO2 91 %. Filed Vitals:   09/17/14 1007  BP: 105/49  Pulse: 91  Temp: 98.5 F (36.9 C)  Resp: 8     Labs on Admission:  Basic Metabolic Panel:  Recent Labs Lab 09/14/14 1137 09/17/14 0500  NA 139 136  K 3.3* 3.1*  CL 95* 92*  CO2 31 31  GLUCOSE 151* 166*  BUN 14 14  CREATININE 1.02 1.17*  CALCIUM 9.8 9.3   Liver Function Tests: No results for input(s): AST, ALT, ALKPHOS, BILITOT, PROT, ALBUMIN in the last 168 hours. No results for input(s): LIPASE, AMYLASE in the last 168 hours. No results for input(s): AMMONIA in the last 168 hours. CBC:  Recent Labs Lab 09/14/14 1234 09/17/14 0500  WBC 11.3* 10.7*  HGB 11.9* 10.4*  HCT 37.7 33.5*  MCV 95.0 97.4  PLT 348 266   Cardiac Enzymes: No results for input(s): CKTOTAL, CKMB, CKMBINDEX, TROPONINI in the last 168  hours. BNP: Invalid input(s): POCBNP CBG:  Recent Labs Lab 09/17/14 1009  GLUCAP 121*    Radiological Exams on Admission: Ct Angio Chest Pe W/cm &/or Wo Cm  09/17/2014   CLINICAL DATA:  Hypoxia.  EXAM: CT ANGIOGRAPHY CHEST WITH CONTRAST  TECHNIQUE: Multidetector CT imaging of the chest was performed using the standard protocol during bolus administration of intravenous contrast. Multiplanar CT image reconstructions and MIPs were obtained to evaluate the vascular anatomy.  CONTRAST:  64mL OMNIPAQUE IOHEXOL 350 MG/ML SOLN  COMPARISON:  None.  FINDINGS: The pulmonary arteries are well opacified. There is no evidence of pulmonary embolism. The thoracic aorta is of normal caliber and shows no evidence of aneurysm or dissection. Proximal great vessels show bovine branching anatomy and are normally patent.  The heart size is normal. No pericardial fluid identified. There is no evidence of pulmonary edema, consolidation, pneumothorax, nodule or pleural fluid.  No enlarged lymph nodes are seen. No evidence of airway obstruction. Mild bibasilar atelectasis present. Visualized upper abdominal structures are unremarkable. The bony thorax shows mild thoracic spondylosis.  Review of the MIP images confirms the above findings.  IMPRESSION: No evidence of pulmonary embolism or other acute findings in the chest.   Electronically Signed   By: Aletta Edouard M.D.   On: 09/17/2014 07:40    EKG: Independently reviewed. none  Time spent: [place], [facility] Pager [phone]  If 7PM-7AM, please  contact night-coverage [email or web address] Password [facility] 09/17/2014, 11:44 AM

## 2014-09-17 NOTE — Progress Notes (Signed)
Pt desaturated to 70s, no complaints of chest pain or shortness of breath. Instructed pt to use incentive spirometer, pt saturation improved to the 90s but desaturated shortly after. Placed the pt on oxygen, saturation improved to 94-96. Notified Oncall MD about pt status, orders placed, Will continue to monitor.

## 2014-09-17 NOTE — Progress Notes (Signed)
Medicare Important Message given?  YES (If response is "NO", the following Medicare IM given date fields will be blank) Date Medicare IM given:  09/17/14 Medicare IM given by:  Tomi Bamberger

## 2014-09-17 NOTE — Clinical Social Work Note (Signed)
CSW informed by bedside RN that the pt will not discharge today due to alter mental status change. CSW informed the pt's son [name] and Ivin Booty at [employer]. CSW will continue to follow and assist.   Kijuana Ruppel, MSW, [place]

## 2014-09-17 NOTE — Progress Notes (Signed)
   Patient reviewed, much better More alert, no cognition of what happened this am  [facility] will follow as consult-if persisting issues tonight or tommorrow, we will assume care with permission of Dr. Doran Durand  Discussed in detail on phone with son who understands d/c might be delayed  I have resumed only home dosage of opiates Toradol IV severe pain Add Naproxen if prn for breakthrough [ensure eats with this] Continuous pulse ox overnight-if below 88, might benefit from Split night sleep study as OP   Verneita Griffes, MD [facility] ([phone]

## 2014-09-17 NOTE — Progress Notes (Signed)
Subjective: 3 Days Post-Op Procedure(s) (LRB): OPEN REDUCTION INTERNAL FIXATION (ORIF) RIGHT ANKLE FRACTURE/SYNDESMOSIS  (Right) Patient reports pain as mild.  Pt tachy and hypoxemic overnight.  PE ruled out on spiral CT.  Still lethargic this morning so I consulted Dr. Verlon Au.  Polypharmacy suspected as the cause of the hypoxemia.  Offending meds changed.  Pt doing somewhat better on O2 by [place].  Objective: Vital signs in last 24 hours: Temp:  [98.5 F (36.9 C)-101.8 F (38.8 C)] 99 F (37.2 C) (04/15 1306) Pulse Rate:  [91-124] 94 (04/15 1306) Resp:  [8-18] 8 (04/15 1306) BP: (105-134)/(49-76) 114/61 mmHg (04/15 1306) SpO2:  [91 %-96 %] 96 % (04/15 1316)  Intake/Output from previous day: 04/14 0701 - 04/15 0700 In: 120 [P.O.:120] Out: 100 [Urine:100] Intake/Output this shift:     Recent Labs  09/17/14 0500  HGB 10.4*    Recent Labs  09/17/14 0500  WBC 10.7*  RBC 3.44*  HCT 33.5*  PLT 266    Recent Labs  09/17/14 0500  NA 136  K 3.1*  CL 92*  CO2 31  BUN 14  CREATININE 1.17*  GLUCOSE 166*  CALCIUM 9.3   No results for input(s): LABPT, INR in the last 72 hours.  L LE splinted.  NVI at toes.  Assessment/Plan: 3 Days Post-Op Procedure(s) (LRB): OPEN REDUCTION INTERNAL FIXATION (ORIF) RIGHT ANKLE FRACTURE/SYNDESMOSIS  (Right) Cancel d/c to snf for today.  Pt will be observed for another day for sxs.  Dr. Arlyss Queen expertise greatly appreciated.  Wylene Simmer 09/17/2014, 4:43 PM

## 2014-09-18 DIAGNOSIS — J9601 Acute respiratory failure with hypoxia: Secondary | ICD-10-CM

## 2014-09-18 DIAGNOSIS — G2581 Restless legs syndrome: Secondary | ICD-10-CM

## 2014-09-18 DIAGNOSIS — G9340 Encephalopathy, unspecified: Secondary | ICD-10-CM | POA: Diagnosis not present

## 2014-09-18 DIAGNOSIS — I10 Essential (primary) hypertension: Secondary | ICD-10-CM

## 2014-09-18 LAB — URINALYSIS, ROUTINE W REFLEX MICROSCOPIC
BILIRUBIN URINE: NEGATIVE
Glucose, UA: NEGATIVE mg/dL
Hgb urine dipstick: NEGATIVE
KETONES UR: NEGATIVE mg/dL
Nitrite: NEGATIVE
Protein, ur: NEGATIVE mg/dL
Specific Gravity, Urine: 1.019 (ref 1.005–1.030)
Urobilinogen, UA: 0.2 mg/dL (ref 0.0–1.0)
pH: 7 (ref 5.0–8.0)

## 2014-09-18 LAB — URINE MICROSCOPIC-ADD ON

## 2014-09-18 MED ORDER — LORAZEPAM 0.5 MG PO TABS: 0.5000 mg | ORAL_TABLET | Freq: Four times a day (QID) | ORAL | Status: DC | PRN

## 2014-09-18 MED ORDER — GABAPENTIN 800 MG PO TABS
400.0000 mg | ORAL_TABLET | Freq: Two times a day (BID) | ORAL | Status: DC
  Filled 2014-09-18 (×2): qty 0.5

## 2014-09-18 MED ORDER — DULOXETINE HCL 60 MG PO CPEP
60.0000 mg | ORAL_CAPSULE | Freq: Every day | ORAL | Status: DC
  Administered 2014-09-19 – 2014-09-20 (×2): 60 mg via ORAL
  Filled 2014-09-18 (×2): qty 1

## 2014-09-18 MED ORDER — KETOROLAC TROMETHAMINE 15 MG/ML IJ SOLN: 7.5000 mg | Freq: Four times a day (QID) | INTRAMUSCULAR | Status: DC | PRN

## 2014-09-18 MED ORDER — TEMAZEPAM 7.5 MG PO CAPS: 7.5000 mg | ORAL_CAPSULE | Freq: Every evening | ORAL | Status: DC | PRN

## 2014-09-18 MED ORDER — MIRTAZAPINE 7.5 MG PO TABS: 7.5000 mg | ORAL_TABLET | Freq: Every day | ORAL | Status: DC

## 2014-09-18 MED ORDER — GABAPENTIN 400 MG PO CAPS
400.0000 mg | ORAL_CAPSULE | Freq: Two times a day (BID) | ORAL | Status: DC
  Administered 2014-09-18 – 2014-09-20 (×5): 400 mg via ORAL
  Filled 2014-09-18 (×6): qty 1

## 2014-09-18 NOTE — Progress Notes (Signed)
CSW received a message from [place] at [facility] requesting an update on disposition. CSW informed Ivin Booty that per MD note the Pt will not be considered for discharge until Monday.   CSW Marja Kays [phone] will continue to follow Pt for wknd.    [facility]  2S, [address], 5N, 6N, [place], PEDS/PICU [phone]

## 2014-09-18 NOTE — Progress Notes (Signed)
Subjective: 4 Days Post-Op Procedure(s) (LRB): OPEN REDUCTION INTERNAL FIXATION (ORIF) RIGHT ANKLE FRACTURE/SYNDESMOSIS  (Right) Patient reports pain as mild.  Denies any dizziness, SOB, CP.  No n/v.  Tolerating regular diet. + BM.  Objective: Vital signs in last 24 hours: Temp:  [98.3 F (36.8 C)-99 F (37.2 C)] 98.3 F (36.8 C) (04/16 0620) Pulse Rate:  [91-99] 92 (04/16 0620) Resp:  [8-20] 20 (04/16 0620) BP: (99-127)/(49-98) 99/50 mmHg (04/16 0620) SpO2:  [91 %-96 %] 95 % (04/16 0620)  On RA.  Intake/Output from previous day: 04/15 0701 - 04/16 0700 In: 240 [P.O.:240] Out: 301 [Urine:300; Stool:1] Intake/Output this shift:     Recent Labs  09/17/14 0500  HGB 10.4*    Recent Labs  09/17/14 0500  WBC 10.7*  RBC 3.44*  HCT 33.5*  PLT 266    Recent Labs  09/17/14 0500  NA 136  K 3.1*  CL 92*  CO2 31  BUN 14  CREATININE 1.17*  GLUCOSE 166*  CALCIUM 9.3   No results for input(s): LABPT, INR in the last 72 hours.  PE:  wn wd woman in nad.  A and O x 4.  Mood and affect normal.  L ankle immoibilized in a splint.  No change in NV exam.  Assessment/Plan: 4 Days Post-Op Procedure(s) (LRB): OPEN REDUCTION INTERNAL FIXATION (ORIF) RIGHT ANKLE FRACTURE/SYNDESMOSIS  (Right) Up with therapy  Encourage PO fluid intake.  May consider NS bolus if BP remains low.  I'll keep her here until Monday due to the hypotension.  Plan SNF discharge Monday.    Will follow Hospitalist recs for further management.  Wylene Simmer 09/18/2014, 9:18 AM

## 2014-09-18 NOTE — Progress Notes (Signed)
Pt BP rechecked this am 117/55. Dr Doran Durand at bedside, okay with HCTZ to be given, but doesn't want norvasc given this am. Dr Conley Canal was contacted by Dr Doran Durand this am also about BP. (pt's was 99/50 early am on night shift).

## 2014-09-18 NOTE — Progress Notes (Signed)
[facility] PROGRESS NOTE  [name] PJA:[id number] DOB: [birth year] DOA: 09/14/2014 PCP: Isabella Kinnier, DO  Summary:   74 year old white female with history of hypertension, restless leg syndrome, depression, insomnia admitted for ORIF of ankle fracture. On 09/17/2014 she was noted to be somnolent and hypoxic. Hospitalists were consult to to assist with management. Felt to be related to sedating medications and possibly undiagnosed sleep apnea.  Assessment/Plan:  Principal Problem:   Ankle fracture, lateral malleolus, closed  Active Problems:   Acute encephalopathy: Medication related, mental status back to baseline. Patient is no longer on Remeron. Would not resume. Takes Restoril instead. May resume at lower dose. 7.5 mg nightly as needed. [name] complaining of not getting her gabapentin. Will resume at lower dose, 400 mg by mouth twice a day and monitor. Change ativan to as needed.  Doubt Celexa contributing. Will increase to home dose.    Acute respiratory failure:  Resolved. Medication related and possibly undiagnosed sleep apnea. CT angiogram negative for PE.    HTN (hypertension):  Blood pressure borderline and appears somewhat volume depleted on exam. We'll hold antihypertensives for now. Recheck BMET and morning.    Restless legs syndrome (RLS)    Hyperlipemia    Severe obesity (BMI >= 40)   HPI/Subjective: Feels constipated. Denies dyspnea. Takes Restoril as needed, but not Remeron. Requesting gabapentin be resumed.  Objective: Filed Vitals:   09/18/14 0925  BP: 117/55  Pulse: 90  Temp:   Resp:     Intake/Output Summary (Last 24 hours) at 09/18/14 1318 Last data filed at 09/17/14 2228  Gross per 24 hour  Intake    240 ml  Output    301 ml  Net    -61 ml   Filed Weights   09/14/14 1001  Weight: 99.791 kg (220 lb)    Exam:   General:  Alert oriented and appropriate [name] HEENT: Dry mucous membranes  Cardiovascular: Regular rate rhythm  without murmurs gallops rubs  Respiratory: Clear to auscultation bilaterally without wheezes rhonchi or rales  Abdomen: Soft nontender nondistended  Ext: No clubbing cyanosis   Basic Metabolic Panel:  Recent Labs Lab 09/14/14 1137 09/17/14 0500  NA 139 136  K 3.3* 3.1*  CL 95* 92*  CO2 31 31  GLUCOSE 151* 166*  BUN 14 14  CREATININE 1.02 1.17*  CALCIUM 9.8 9.3   Liver Function Tests: No results for input(s): AST, ALT, ALKPHOS, BILITOT, PROT, ALBUMIN in the last 168 hours. No results for input(s): LIPASE, AMYLASE in the last 168 hours. No results for input(s): AMMONIA in the last 168 hours. CBC:  Recent Labs Lab 09/14/14 1234 09/17/14 0500  WBC 11.3* 10.7*  HGB 11.9* 10.4*  HCT 37.7 33.5*  MCV 95.0 97.4  PLT 348 266   Cardiac Enzymes: No results for input(s): CKTOTAL, CKMB, CKMBINDEX, TROPONINI in the last 168 hours. BNP (last 3 results) No results for input(s): BNP in the last 8760 hours.  ProBNP (last 3 results) No results for input(s): PROBNP in the last 8760 hours.  CBG:  Recent Labs Lab 09/17/14 1009  GLUCAP 121*    No results found for this or any previous visit (from the past 240 hour(s)).   Studies: Ct Angio Chest Pe W/cm &/or Wo Cm  09/17/2014   CLINICAL DATA:  Hypoxia.  EXAM: CT ANGIOGRAPHY CHEST WITH CONTRAST  TECHNIQUE: Multidetector CT imaging of the chest was performed using the standard protocol during bolus administration of intravenous contrast. Multiplanar CT image reconstructions and  MIPs were obtained to evaluate the vascular anatomy.  CONTRAST:  105mL OMNIPAQUE IOHEXOL 350 MG/ML SOLN  COMPARISON:  None.  FINDINGS: The pulmonary arteries are well opacified. There is no evidence of pulmonary embolism. The thoracic aorta is of normal caliber and shows no evidence of aneurysm or dissection. Proximal great vessels show bovine branching anatomy and are normally patent.  The heart size is normal. No pericardial fluid identified. There is no  evidence of pulmonary edema, consolidation, pneumothorax, nodule or pleural fluid.  No enlarged lymph nodes are seen. No evidence of airway obstruction. Mild bibasilar atelectasis present. Visualized upper abdominal structures are unremarkable. The bony thorax shows mild thoracic spondylosis.  Review of the MIP images confirms the above findings.  IMPRESSION: No evidence of pulmonary embolism or other acute findings in the chest.   Electronically Signed   By: Aletta Edouard M.D.   On: 09/17/2014 07:40    Scheduled Meds: . antiseptic oral rinse  7 mL Mouth Rinse BID  . aspirin  325 mg Oral Daily  . calcium carbonate  1,250 mg Oral Q supper  . carbidopa-levodopa  1 tablet Oral QHS  . carbidopa-levodopa  1 tablet Oral TID WC  . cholecalciferol  1,000 Units Oral Daily  . docusate sodium  100 mg Oral BID  . [START ON 09/19/2014] DULoxetine  60 mg Oral Daily  . enoxaparin (LOVENOX) injection  40 mg Subcutaneous Q24H  . gabapentin  400 mg Oral BID  . LORazepam  0.5 mg Oral Q12H  . nystatin  1 Bottle Topical BID  . pyridOXINE  50 mg Oral Daily  . senna  2 tablet Oral BID   Continuous Infusions:   Time spent: 35 minutes  [facility] Pager [phone] [email or web address], password [facility] 09/18/2014, 1:18 PM  LOS: 4 days

## 2014-09-19 DIAGNOSIS — E876 Hypokalemia: Secondary | ICD-10-CM | POA: Diagnosis not present

## 2014-09-19 DIAGNOSIS — K5909 Other constipation: Secondary | ICD-10-CM

## 2014-09-19 DIAGNOSIS — K5900 Constipation, unspecified: Secondary | ICD-10-CM | POA: Clinically undetermined

## 2014-09-19 LAB — MAGNESIUM: Magnesium: 1.7 mg/dL (ref 1.5–2.5)

## 2014-09-19 LAB — BASIC METABOLIC PANEL
Anion gap: 12 (ref 5–15)
BUN: 10 mg/dL (ref 6–23)
CO2: 33 mmol/L — ABNORMAL HIGH (ref 19–32)
Calcium: 9.6 mg/dL (ref 8.4–10.5)
Chloride: 92 mmol/L — ABNORMAL LOW (ref 96–112)
Creatinine, Ser: 0.77 mg/dL (ref 0.50–1.10)
GFR calc Af Amer: >90 mL/min (ref >90)
GFR calc non Af Amer: 82 mL/min — ABNORMAL LOW (ref >90)
Glucose, Bld: 150 mg/dL — ABNORMAL HIGH (ref 70–99)
Potassium: 2.7 mmol/L — CL (ref 3.5–5.1)
Sodium: 137 mmol/L (ref 135–145)

## 2014-09-19 MED ORDER — POTASSIUM CHLORIDE CRYS ER 20 MEQ PO TBCR
40.0000 meq | EXTENDED_RELEASE_TABLET | ORAL | Status: AC
  Administered 2014-09-19 (×3): 40 meq via ORAL
  Filled 2014-09-19 (×3): qty 2

## 2014-09-19 MED ORDER — BISACODYL 10 MG RE SUPP: 10.0000 mg | Freq: Every day | RECTAL | Status: DC | PRN

## 2014-09-19 MED ORDER — FLEET ENEMA 7-19 GM/118ML RE ENEM
1.0000 | ENEMA | Freq: Every day | RECTAL | Status: DC | PRN
  Administered 2014-09-19: 1 via RECTAL
  Filled 2014-09-19 (×3): qty 1

## 2014-09-19 NOTE — Progress Notes (Signed)
Orthopedics Progress Note  Subjective: Patient complaining of constipation. No ankle issues.  Objective:  Filed Vitals:   09/19/14 0558  BP: 140/74  Pulse: 94  Temp: 98.4 F (36.9 C)  Resp: 18    General: Awake and alert  Musculoskeletal: right leg splint intact, toes well perfused and wiggling without pain. Splint fits well. No cords, elevated Neurovascularly intact  Lab Results  Component Value Date   WBC 10.7* 09/17/2014   HGB 10.4* 09/17/2014   HCT 33.5* 09/17/2014   MCV 97.4 09/17/2014   PLT 266 09/17/2014       Component Value Date/Time   NA 137 09/19/2014 0619   NA CANCELED 08/05/2014 1511   K 2.7* 09/19/2014 0619   CL 92* 09/19/2014 0619   CO2 33* 09/19/2014 0619   GLUCOSE 150* 09/19/2014 0619   GLUCOSE CANCELED 08/05/2014 1511   BUN 10 09/19/2014 0619   BUN CANCELED 08/05/2014 1511   CREATININE 0.77 09/19/2014 0619   CALCIUM 9.6 09/19/2014 0619   GFRNONAA 82* 09/19/2014 0619   GFRAA >90 09/19/2014 0619    Lab Results  Component Value Date   INR 0.96 09/13/2011   INR 0.93 12/01/2009   INR 1.03 11/23/2009    Assessment/Plan:  s/p Procedure(s): OPEN REDUCTION INTERNAL FIXATION (ORIF) RIGHT ANKLE FRACTURE/SYNDESMOSIS  Big issue is constipation at this point.  Will try enema or suppository as oral agents have had little effect. Plan discharge tomorrow if BM to SNF Continue NWB R LE  Doran Heater. Veverly Fells, MD 09/19/2014 8:54 AM

## 2014-09-19 NOTE — Progress Notes (Signed)
[facility] PROGRESS NOTE  EVELY GAINEY SPQ:[id number] DOB: [birth year] DOA: 09/14/2014 PCP: Hollace Kinnier, DO  Summary:   74 year old white female with history of hypertension, restless leg syndrome, depression, insomnia admitted for ORIF of ankle fracture. On 09/17/2014 she was noted to be somnolent and hypoxic. Hospitalists were consult to to assist with management. Felt to be related to sedating medications and possibly undiagnosed sleep apnea.  Assessment/Plan:  Principal Problem:   Ankle fracture, lateral malleolus, closed  Active Problems:   Acute encephalopathy: Medication related, mental status back to baseline. Patient is no longer on Remeron. Would not resume. Takes Restoril instead. May resume at lower dose. 7.5 mg nightly as needed. Gabapentin dose decreased  Constipation: minimal results with suppository. For enema today  Hypokalemia: replete po    Acute respiratory failure:  Resolved. Medication related and possibly undiagnosed sleep apnea. CT angiogram negative for PE.  Needs outpatient polysomnogram    HTN (hypertension):  Blood pressures borderline and antihypertensives held    Restless legs syndrome (RLS)    Hyperlipemia    Severe obesity (BMI >= 40)   HPI/Subjective: Constipated. Pain controlled  Objective: Filed Vitals:   09/19/14 1346  BP: 138/69  Pulse: 95  Temp: 97.9 F (36.6 C)  Resp: 20    Intake/Output Summary (Last 24 hours) at 09/19/14 1535 Last data filed at 09/19/14 1516  Gross per 24 hour  Intake    203 ml  Output   2580 ml  Net  -2377 ml   Filed Weights   09/14/14 1001  Weight: 99.791 kg (220 lb)    Exam:   General:  Alert oriented and appropriate   HEENT: MMM  Cardiovascular: Regular rate rhythm without murmurs gallops rubs  Respiratory: Clear to auscultation bilaterally without wheezes rhonchi or rales  Abdomen: Soft nontender nondistended  Ext: No clubbing cyanosis   Basic Metabolic Panel:  Recent  Labs Lab 09/14/14 1137 09/17/14 0500 09/19/14 0619  NA 139 136 137  K 3.3* 3.1* 2.7*  CL 95* 92* 92*  CO2 31 31 33*  GLUCOSE 151* 166* 150*  BUN 14 14 10   CREATININE 1.02 1.17* 0.77  CALCIUM 9.8 9.3 9.6  MG  --   --  1.7   Liver Function Tests: No results for input(s): AST, ALT, ALKPHOS, BILITOT, PROT, ALBUMIN in the last 168 hours. No results for input(s): LIPASE, AMYLASE in the last 168 hours. No results for input(s): AMMONIA in the last 168 hours. CBC:  Recent Labs Lab 09/14/14 1234 09/17/14 0500  WBC 11.3* 10.7*  HGB 11.9* 10.4*  HCT 37.7 33.5*  MCV 95.0 97.4  PLT 348 266   Cardiac Enzymes: No results for input(s): CKTOTAL, CKMB, CKMBINDEX, TROPONINI in the last 168 hours. BNP (last 3 results) No results for input(s): BNP in the last 8760 hours.  ProBNP (last 3 results) No results for input(s): PROBNP in the last 8760 hours.  CBG:  Recent Labs Lab 09/17/14 1009  GLUCAP 121*    No results found for this or any previous visit (from the past 240 hour(s)).   Studies: No results found.  Scheduled Meds: . antiseptic oral rinse  7 mL Mouth Rinse BID  . aspirin  325 mg Oral Daily  . calcium carbonate  1,250 mg Oral Q supper  . carbidopa-levodopa  1 tablet Oral QHS  . carbidopa-levodopa  1 tablet Oral TID WC  . cholecalciferol  1,000 Units Oral Daily  . docusate sodium  100 mg Oral BID  . DULoxetine  60 mg Oral Daily  . enoxaparin (LOVENOX) injection  40 mg Subcutaneous Q24H  . gabapentin  400 mg Oral BID  . nystatin  1 Bottle Topical BID  . potassium chloride  40 mEq Oral Q4H  . pyridOXINE  50 mg Oral Daily  . senna  2 tablet Oral BID   Continuous Infusions:   Time spent: 25 minutes  [facility] Pager [phone] [email or web address], password [facility] 09/19/2014, 3:35 PM  LOS: 5 days

## 2014-09-20 DIAGNOSIS — G459 Transient cerebral ischemic attack, unspecified: Secondary | ICD-10-CM | POA: Diagnosis not present

## 2014-09-20 DIAGNOSIS — F332 Major depressive disorder, recurrent severe without psychotic features: Secondary | ICD-10-CM | POA: Diagnosis not present

## 2014-09-20 DIAGNOSIS — E785 Hyperlipidemia, unspecified: Secondary | ICD-10-CM | POA: Diagnosis not present

## 2014-09-20 DIAGNOSIS — G600 Hereditary motor and sensory neuropathy: Secondary | ICD-10-CM | POA: Diagnosis not present

## 2014-09-20 DIAGNOSIS — S82891S Other fracture of right lower leg, sequela: Secondary | ICD-10-CM | POA: Diagnosis not present

## 2014-09-20 DIAGNOSIS — M21271 Flexion deformity, right ankle and toes: Secondary | ICD-10-CM | POA: Diagnosis not present

## 2014-09-20 DIAGNOSIS — K5909 Other constipation: Secondary | ICD-10-CM | POA: Diagnosis not present

## 2014-09-20 DIAGNOSIS — M5136 Other intervertebral disc degeneration, lumbar region: Secondary | ICD-10-CM | POA: Diagnosis not present

## 2014-09-20 DIAGNOSIS — Z4789 Encounter for other orthopedic aftercare: Secondary | ICD-10-CM | POA: Diagnosis not present

## 2014-09-20 DIAGNOSIS — K210 Gastro-esophageal reflux disease with esophagitis: Secondary | ICD-10-CM | POA: Diagnosis not present

## 2014-09-20 DIAGNOSIS — G2581 Restless legs syndrome: Secondary | ICD-10-CM | POA: Diagnosis not present

## 2014-09-20 DIAGNOSIS — S82451D Displaced comminuted fracture of shaft of right fibula, subsequent encounter for closed fracture with routine healing: Secondary | ICD-10-CM | POA: Diagnosis not present

## 2014-09-20 DIAGNOSIS — N390 Urinary tract infection, site not specified: Secondary | ICD-10-CM | POA: Diagnosis not present

## 2014-09-20 DIAGNOSIS — R2681 Unsteadiness on feet: Secondary | ICD-10-CM | POA: Diagnosis not present

## 2014-09-20 DIAGNOSIS — I10 Essential (primary) hypertension: Secondary | ICD-10-CM | POA: Diagnosis not present

## 2014-09-20 DIAGNOSIS — S8262XS Displaced fracture of lateral malleolus of left fibula, sequela: Secondary | ICD-10-CM | POA: Diagnosis not present

## 2014-09-20 DIAGNOSIS — S82892D Other fracture of left lower leg, subsequent encounter for closed fracture with routine healing: Secondary | ICD-10-CM | POA: Diagnosis not present

## 2014-09-20 DIAGNOSIS — G4700 Insomnia, unspecified: Secondary | ICD-10-CM | POA: Diagnosis not present

## 2014-09-20 DIAGNOSIS — G63 Polyneuropathy in diseases classified elsewhere: Secondary | ICD-10-CM | POA: Diagnosis not present

## 2014-09-20 DIAGNOSIS — F329 Major depressive disorder, single episode, unspecified: Secondary | ICD-10-CM | POA: Diagnosis not present

## 2014-09-20 DIAGNOSIS — L03119 Cellulitis of unspecified part of limb: Secondary | ICD-10-CM | POA: Diagnosis not present

## 2014-09-20 DIAGNOSIS — M6281 Muscle weakness (generalized): Secondary | ICD-10-CM | POA: Diagnosis not present

## 2014-09-20 DIAGNOSIS — M1990 Unspecified osteoarthritis, unspecified site: Secondary | ICD-10-CM | POA: Diagnosis not present

## 2014-09-20 LAB — BASIC METABOLIC PANEL
Anion gap: 14 (ref 5–15)
BUN: 13 mg/dL (ref 6–23)
CALCIUM: 9.8 mg/dL (ref 8.4–10.5)
CHLORIDE: 97 mmol/L (ref 96–112)
CO2: 27 mmol/L (ref 19–32)
CREATININE: 0.87 mg/dL (ref 0.50–1.10)
GFR calc Af Amer: 75 mL/min — ABNORMAL LOW (ref >90)
GFR, EST NON AFRICAN AMERICAN: 65 mL/min — AB (ref >90)
Glucose, Bld: 140 mg/dL — ABNORMAL HIGH (ref 70–99)
Potassium: 4.6 mmol/L (ref 3.5–5.1)
Sodium: 138 mmol/L (ref 135–145)

## 2014-09-20 MED ORDER — TEMAZEPAM 7.5 MG PO CAPS: 7.5000 mg | ORAL_CAPSULE | Freq: Every evening | ORAL | Status: DC | PRN

## 2014-09-20 MED ORDER — OXYCODONE HCL 5 MG PO TABS: 5.0000 mg | ORAL_TABLET | Freq: Four times a day (QID) | ORAL | Status: DC | PRN

## 2014-09-20 MED ORDER — GABAPENTIN 400 MG PO CAPS: 400.0000 mg | ORAL_CAPSULE | Freq: Two times a day (BID) | ORAL | Status: DC

## 2014-09-20 NOTE — Clinical Social Work Psychosocial (Deleted)
Clinical Social Work Department BRIEF PSYCHOSOCIAL ASSESSMENT 09/20/2014  Patient:  [name]     Account Number:  [id number]     Admit date:  09/05/2014  Clinical Social Worker:  Lovey Newcomer  Date/Time:  09/20/2014 11:24 AM  Referred by:  Physician  Date Referred:  09/20/2014 Referred for  SNF Placement   Other Referral:   NA   Interview type:  Family Other interview type:   Patient is unable to contribute to assessment. Patient's father interviewed to complete assessment.    PSYCHOSOCIAL DATA Living Status:  FACILITY Admitted from facility:  [facility] Level of care:  Long Term Acute Primary support name:  Hall Busing Primary support relationship to patient:  PARENT Degree of support available:   Support is good.    CURRENT CONCERNS Current Concerns  Post-Acute Placement   Other Concerns:   NA    SOCIAL WORK ASSESSMENT / PLAN CSW spoke with patient's father Hall Busing by phone to complete assessment. Hall Busing states that the patient was admitted from [facility] and understands that the patient is unable to return to the facility at discharge. CSW explained the work that has been done to find placement for patient at [facility] and shares that [facility] in [place] may be a SNF option for the patient at discharge. CSW explained SNF search/placement process and answered Lorenzo's questions. Hall Busing seems to have limited insight into the patient's condition, and may have unrealistic expectations for the patient's prognosis. CSW will assist with placement.      Father provided his cell phone number [phone] and address: [place].   Assessment/plan status:  Psychosocial Support/Ongoing Assessment of Needs Other assessment/ plan:   Complete FL2, Fax, PASRR   Information/referral to community resources:   CSW contact information given to father.    PATIENT'S/FAMILY'S RESPONSE TO PLAN OF CARE: Patient's father is agreeable  to the patient discharging to a SNF when medically ready and understands why patient is unable to return to [facility]. CSW will assist.       Liz Beach MSW, [place], [place], [phone]

## 2014-09-20 NOTE — Progress Notes (Signed)
All medications in AVS explained to, patient verbalizes understanding and verification of medications listed in the AVS including correct drug and dosages as well as the reason why the gabapentin was decreased to 400mg .

## 2014-09-20 NOTE — Progress Notes (Signed)
NURSING PROGRESS NOTE  [name] [id number] Discharge Data: 09/20/2014 3:00 PM Attending Provider: Wylene Simmer, MD JAS:NKNL, [name], DO   [name] to be D/C'd Skilled nursing facility per MD order. All discharge paperwork given to EMS transport staff including prescriptions.    All IV's will be discontinued and monitored for bleeding.  All belongings will be returned to patient for patient to take home.  Last Documented Vital Signs:  Blood pressure 159/69, pulse 113, temperature 98 F (36.7 C), temperature source Oral, resp. rate 16, height 5\' 5"  (1.651 m), weight 99.791 kg (220 lb), SpO2 99 %.  Hendricks Limes RN, BS, BSN

## 2014-09-20 NOTE — Progress Notes (Signed)
PT Cancellation Note  Patient Details Name: [name] MRN: [id number] DOB: 1941-02-24   Cancelled Treatment:    Reason Eval/Treat Not Completed: Patient declined due to nauseated.  Expecting to d/c today. 09/20/2014  Donnella Sham, [place] [phone]  (pager)   Ivy Puryear, Tessie Fass 09/20/2014, 11:35 AM

## 2014-09-20 NOTE — Clinical Social Work Placement (Signed)
Clinical Social Work Department CLINICAL SOCIAL WORK PLACEMENT NOTE 09/20/2014  Patient:  [name], [name]  Account Number:  [id number] Admit date:  09/14/2014  Clinical Social Worker:  Barbette Or, LCSW  Date/time:  09/15/2014 02:30 PM  Clinical Social Work is seeking post-discharge placement for this patient at the following level of care:   SKILLED NURSING   (*CSW will update this form in Epic as items are completed)   09/15/2014  Patient/family provided with [facility] Department of Clinical Social Work's list of facilities offering this level of care within the geographic area requested by the patient (or if unable, by the patient's family).  09/15/2014  Patient/family informed of their freedom to choose among providers that offer the needed level of care, that participate in Medicare, Medicaid or managed care program needed by the patient, have an available bed and are willing to accept the patient.  09/15/2014  Patient/family informed of [facility]' ownership interest in [facility], as well as of the fact that they are under no obligation to receive care at this facility.  PASARR submitted to EDS on 09/15/2014 PASARR number received on 09/15/2014  FL2 transmitted to all facilities in geographic area requested by pt/family on  09/15/2014 FL2 transmitted to all facilities within larger geographic area on   Patient informed that his/her managed care company has contracts with or will negotiate with  certain facilities, including the following:     Patient/family informed of bed offers received:  09/15/2014 Patient chooses bed at [facility] Physician recommends and patient chooses bed at    Patient to be transferred to [facility] on  09/20/2014 Patient to be transferred to facility by Ambulance Patient and family notified of transfer on 09/20/2014 Name of family member notified:  Patient states she has notified her sons  The following physician request were  entered in Epic:   Additional Comments: Per MD patient ready for DC to [facility] RN, patient, patient's family, and facility notified of DC. RN given number for report. DC packet on chart. Ambulance transport requested for patient. CSW signing off.   [name] MSW, [place], [place], [phone]

## 2014-09-20 NOTE — Progress Notes (Signed)
Note for provider this is what the patient says is her regular dose of medications, she needs these to be changed prior to discharge today.  25-200 Carbidopa-Levadopa TID with meals and nightly 25-250 CR Carbidopa-Levadopa  Gabapentin 800mg  4x per day.

## 2014-09-21 ENCOUNTER — Non-Acute Institutional Stay (SKILLED_NURSING_FACILITY): Payer: Medicare Other | Admitting: Adult Health

## 2014-09-21 ENCOUNTER — Encounter: Payer: Self-pay | Admitting: Adult Health

## 2014-09-21 DIAGNOSIS — G629 Polyneuropathy, unspecified: Secondary | ICD-10-CM

## 2014-09-21 DIAGNOSIS — K5909 Other constipation: Secondary | ICD-10-CM

## 2014-09-21 DIAGNOSIS — G2581 Restless legs syndrome: Secondary | ICD-10-CM

## 2014-09-21 DIAGNOSIS — S82891S Other fracture of right lower leg, sequela: Secondary | ICD-10-CM | POA: Diagnosis not present

## 2014-09-21 DIAGNOSIS — F332 Major depressive disorder, recurrent severe without psychotic features: Secondary | ICD-10-CM | POA: Diagnosis not present

## 2014-09-21 DIAGNOSIS — E785 Hyperlipidemia, unspecified: Secondary | ICD-10-CM | POA: Diagnosis not present

## 2014-09-21 DIAGNOSIS — I10 Essential (primary) hypertension: Secondary | ICD-10-CM

## 2014-09-21 DIAGNOSIS — F419 Anxiety disorder, unspecified: Secondary | ICD-10-CM

## 2014-09-21 NOTE — Progress Notes (Signed)
Patient ID: [name], female   DOB: 12/13/1940, 74 y.o.   MRN: [id number]   09/21/2014  Facility:  Nursing Home Location:  [facility] Room Number: 1008-P LEVEL OF CARE:  SNF (31)  Chief Complaint  Patient presents with  . Hospitalization Follow-up    Right ankle fracture S/P ORIF, neuropathy, hypertension, anxiety, depression, constipation, restless leg syndrome and hyperlipidemia    HISTORY OF PRESENT ILLNESS:  This is a 74 year old female who has been admitted to [facility] on 09/20/14 from [facility]. She has PMH of hypertension, restless leg syndrome, hyperlipidemia, major depression, hypokalemia and constipation. She had a fall at home and sustained a right ankle fracture. She had ORIF done on 4/12. On postop day 3 she developed hypoxemia and lethargy. Spiral CT was negative for PE. It was determined that she had respiratory depression from medications and likely undiagnosed sleep apnea. Her medications were adjusted. However today, she complained to me that she cannot sleep with her lowered dose Neurontin and Sinemet. [name] requesting to reresume home dosage of Neurontin and Sinemet.  She has been admitted for a short-term rehabilitation.  PAST MEDICAL HISTORY:  Past Medical History  Diagnosis Date  . Hypertension   . GERD (gastroesophageal reflux disease)     hx pud '98  . Arthritis     djd  . Restless leg syndrome   . 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 Dysrhythmia     hx tachy palpitations  . Headache(784.0)     remote hx of migraines  . Neuromuscular disorder     peripheral neuropathy, FEET AND LEGS  . Depression     bh admission '09  . Stroke     ? 6 months ago - tests okay - [facility]   . Cellulitis     10/11/11 hospitalized for cellulitis   . Hyperlipemia   . Peripheral neuropathy   . Elbow fracture, left April 2015    CURRENT MEDICATIONS: Reviewed per MAR/see medication list  Allergies  Allergen  Reactions  . Sulfa Drugs Cross Reactors Nausea And Vomiting     REVIEW OF SYSTEMS:  GENERAL: no change in appetite, no fatigue, no weight changes, no fever, chills or weakness RESPIRATORY: no cough, SOB, DOE, wheezing, hemoptysis CARDIAC: no chest pain, edema or palpitations GI: no abdominal pain, diarrhea, constipation, heart burn, nausea or vomiting  PHYSICAL EXAMINATION  GENERAL: no acute distress, obese EYES: conjunctivae normal, sclerae normal, normal eye lids NECK: supple, trachea midline, no neck masses, no thyroid tenderness, no thyromegaly LYMPHATICS: no LAN in the neck, no supraclavicular LAN RESPIRATORY: breathing is even & unlabored, BS CTAB CARDIAC: RRR, no murmur,no extra heart sounds, no edema GI: abdomen soft, normal BS, no masses, no tenderness, no hepatomegaly, no splenomegaly EXTREMITIES: Able to move 4 extremities; right lower leg has cast; able to wiggle toes PSYCHIATRIC: the patient is alert & oriented to person, affect & behavior appropriate  LABS/RADIOLOGY: Labs reviewed: Basic Metabolic Panel:  Recent Labs  09/17/14 0500 09/19/14 0619 09/20/14 0810  NA 136 137 138  K 3.1* 2.7* 4.6  CL 92* 92* 97  CO2 31 33* 27  GLUCOSE 166* 150* 140*  BUN 14 10 13   CREATININE 1.17* 0.77 0.87  CALCIUM 9.3 9.6 9.8  MG  --  1.7  --    Liver Function Tests:  Recent Labs  01/11/14 1514 08/07/14 0614  AST 10 22  ALT <5 <5  ALKPHOS 117 80  BILITOT 0.2 1.0  PROT 7.0 8.2  ALBUMIN  --  4.4   CBC:  Recent Labs  01/11/14 1514 08/07/14 0614 09/14/14 1234 09/17/14 0500  WBC 12.4* 10.1 11.3* 10.7*  NEUTROABS 8.8* 6.6  --   --   HGB 12.7 14.2 11.9* 10.4*  HCT 38.2 43.5 37.7 33.5*  MCV 93 94.6 95.0 97.4  PLT  --  356 348 266   Lipid Panel:  Recent Labs  01/11/14 1514  HDL 40   CBG:  Recent Labs  09/17/14 1009  GLUCAP 121*    Ct Angio Chest Pe W/cm &/or Wo Cm  09/17/2014   CLINICAL DATA:  Hypoxia.  EXAM: CT ANGIOGRAPHY CHEST WITH CONTRAST   TECHNIQUE: Multidetector CT imaging of the chest was performed using the standard protocol during bolus administration of intravenous contrast. Multiplanar CT image reconstructions and MIPs were obtained to evaluate the vascular anatomy.  CONTRAST:  34mL OMNIPAQUE IOHEXOL 350 MG/ML SOLN  COMPARISON:  None.  FINDINGS: The pulmonary arteries are well opacified. There is no evidence of pulmonary embolism. The thoracic aorta is of normal caliber and shows no evidence of aneurysm or dissection. Proximal great vessels show bovine branching anatomy and are normally patent.  The heart size is normal. No pericardial fluid identified. There is no evidence of pulmonary edema, consolidation, pneumothorax, nodule or pleural fluid.  No enlarged lymph nodes are seen. No evidence of airway obstruction. Mild bibasilar atelectasis present. Visualized upper abdominal structures are unremarkable. The bony thorax shows mild thoracic spondylosis.  Review of the MIP images confirms the above findings.  IMPRESSION: No evidence of pulmonary embolism or other acute findings in the chest.   Electronically Signed   By: Aletta Edouard M.D.   On: 09/17/2014 07:40    ASSESSMENT/PLAN:  Right ankle fracture S/P ORIF - or rehabilitation; continue aspirin 325 mg 1 tab by mouth daily for DVT prophylaxis; oxycodone 5 mg 1 tab by mouth every 6 hours when necessary for pain Neuropathy - change Neurontin to 800 mg 1 tab by mouth 4 times a day Restless leg syndrome - change Sinemet to 50/200 mg CR one tab by mouth daily at bedtime and Sinemet 25/250 mg 1 tab by mouth 3 times a day Hypertension - well controlled; continue Hydrodiuril 25 mg by mouth daily Anxiety  - continue Ativan 1 mg by mouth every 8 hours when necessary  Major Depression - mood is stable; continue Remeron 15 mg by mouth daily at bedtime and Cymbalta 60 mg by mouth twice a day Constipation - continue senna 2 tabs by mouth twice a day and Colace 100 mg by mouth twice a  day Hyperlipidemia - continue Lipitor 20 mg by mouth daily at bedtime   Goals of care:  Short-term rehabilitation   Labs/test ordered:    CBC, BMP  Spent 50 minutes in patient care.    [facility], NP [employer] [phone]

## 2014-09-22 ENCOUNTER — Non-Acute Institutional Stay (SKILLED_NURSING_FACILITY): Payer: Medicare Other | Admitting: Internal Medicine

## 2014-09-22 DIAGNOSIS — D62 Acute posthemorrhagic anemia: Secondary | ICD-10-CM

## 2014-09-22 DIAGNOSIS — G2581 Restless legs syndrome: Secondary | ICD-10-CM | POA: Diagnosis not present

## 2014-09-22 DIAGNOSIS — K5909 Other constipation: Secondary | ICD-10-CM

## 2014-09-22 DIAGNOSIS — G63 Polyneuropathy in diseases classified elsewhere: Secondary | ICD-10-CM | POA: Diagnosis not present

## 2014-09-22 DIAGNOSIS — F329 Major depressive disorder, single episode, unspecified: Secondary | ICD-10-CM | POA: Diagnosis not present

## 2014-09-22 DIAGNOSIS — S8262XS Displaced fracture of lateral malleolus of left fibula, sequela: Secondary | ICD-10-CM

## 2014-09-22 DIAGNOSIS — G4700 Insomnia, unspecified: Secondary | ICD-10-CM

## 2014-09-22 DIAGNOSIS — F32A Depression, unspecified: Secondary | ICD-10-CM

## 2014-09-22 DIAGNOSIS — I10 Essential (primary) hypertension: Secondary | ICD-10-CM | POA: Diagnosis not present

## 2014-09-22 DIAGNOSIS — E785 Hyperlipidemia, unspecified: Secondary | ICD-10-CM

## 2014-09-22 DIAGNOSIS — F411 Generalized anxiety disorder: Secondary | ICD-10-CM | POA: Diagnosis not present

## 2014-09-22 LAB — URINE CULTURE: Colony Count: 40000

## 2014-09-22 NOTE — Progress Notes (Signed)
Patient ID: [name], female   DOB: December 19, 1940, 74 y.o.   MRN: [id number]     [facility]   PCP: [name], [name], DO  Code Status: full code  Allergies  Allergen Reactions  . Sulfa Drugs Cross Reactors Nausea And Vomiting    Chief Complaint  Patient presents with  . New Admit To SNF     HPI:  74 year old patient is here for short term rehabilitation post hospital admission from 09/14/14-09/20/14 with closed left lateral malleolus fracture. She underwent ORIF. On post op day 3 she developed hypoxia. Ct chest was negative for pulmonary embolism. It was thought to be iatrogenic with some component of sleep apnea. Her neurontin and sinemt dosing were reduced and she did improve. She has PMH of HTN, HLD, obesity, RLS, depression among others. [name] seen in her room today. She mentions that her pain is under control. She mentions being on norvasc and lipitor at home. Denies any concerns  Review of Systems:  Constitutional: Negative for fever, chills, diaphoresis.  HENT: Negative for headache, congestion, nasal discharge Eyes: Negative for eye pain, blurred vision, double vision and discharge.  Respiratory: Negative for cough, shortness of breath and wheezing.   Cardiovascular: Negative for chest pain, palpitations, leg swelling.  Gastrointestinal: Negative for heartburn, nausea, vomiting, abdominal pain. Had a bowel movement today Genitourinary: Negative for dysuria Musculoskeletal: Negative for back pain, falls Skin: Negative for itching, rash.  Neurological: Negative for dizziness, tingling, focal weakness Psychiatric/Behavioral: Negative for depression.    Past Medical History  Diagnosis Date  . Hypertension   . GERD (gastroesophageal reflux disease)     hx pud '98  . Arthritis     djd  . Restless leg syndrome   . 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 Dysrhythmia     hx tachy palpitations  . Headache(784.0)    remote hx of migraines  . Neuromuscular disorder     peripheral neuropathy, FEET AND LEGS  . Depression     bh admission '09  . Stroke     ? 6 months ago - tests okay - [facility]   . Cellulitis     10/11/11 hospitalized for cellulitis   . Hyperlipemia   . Peripheral neuropathy   . Elbow fracture, left April 2015   Past Surgical History  Procedure Laterality Date  . Cervical disc surgery x2  2011  . Rt carotid enarterectomy  2011  . Rt knee arthroscopy  '99  . Left knee arthroscopy  2001  . Hematoma evacuation  2011    s/p rt cea  . Joint replacement  '01    total knee replacement, LEFT  . Abdominal hysterectomy  1975    bil oophorectomy  . Back surgery   MANY YRS AGO    laminectomy x3  . Cholecystectomy  1993  . Total knee arthroplasty  09/13/2011    Procedure: TOTAL KNEE ARTHROPLASTY;  Surgeon: Magnus Sinning, MD;  Location: [facility];  Service: Orthopedics;  Laterality: Right;  . Right knee replacement      09/2011   . Carpal tunnel release  07/09/2012    Procedure: CARPAL TUNNEL RELEASE;  Surgeon: Magnus Sinning, MD;  Location: [facility];  Service: Orthopedics;  Laterality: Left;  . Finger arthroplasty  07/09/2012    Procedure: FINGER ARTHROPLASTY;  Surgeon: Magnus Sinning, MD;  Location: [facility];  Service: Orthopedics;  Laterality: Left;  Interposition Arthroplasty [facility] Joint Thumb Left   . Lipoma excision  07/09/2012    Procedure: EXCISION LIPOMA;  Surgeon: Magnus Sinning, MD;  Location: [facility];  Service: Orthopedics;  Laterality: Left;  Excision Lipoma Dorsum Left Wrist   . Orif ankle fracture Right 09/14/2014    FIBULA   . Orif ankle fracture Right 09/14/2014    Procedure: OPEN REDUCTION INTERNAL FIXATION (ORIF) RIGHT ANKLE FRACTURE/SYNDESMOSIS ;  Surgeon: Wylene Simmer, MD;  Location: [place];  Service: Orthopedics;  Laterality: Right;   Social History:   reports that she has never smoked. She has never used smokeless tobacco. She reports that she does not drink alcohol or use  illicit drugs.  Family History  Problem Relation Age of Onset  . Congestive Heart Failure Father   . Congestive Heart Failure Sister   . Alzheimer's disease Mother   . Diabetes Brother   . Arthritis Brother     Medications: Patient's Medications  New Prescriptions   No medications on file  Previous Medications   ACETAMINOPHEN (TYLENOL) 325 MG TABLET    Take 2 tablets (650 mg total) by mouth every 6 (six) hours as needed for mild pain (or Fever >/= 101).   AMOXICILLIN (AMOXIL) 500 MG CAPSULE    Take 2,000 mg by mouth See admin instructions. Must take 4 capsules 1 hour before dental procedures   ASPIRIN 325 MG TABLET    Take 325 mg by mouth daily.   ATORVASTATIN (LIPITOR) 20 MG TABLET    Take 1 tablet by mouth at  bedtime   CALCIUM CARBONATE 1250 MG CAPSULE    Take 1,250 mg by mouth daily.    CARBIDOPA-LEVODOPA (SINEMET CR) 50-200 MG PER TABLET    Take 1 tablet by mouth at bedtime.   CARBIDOPA-LEVODOPA (SINEMET IR) 25-250 MG PER TABLET    Take one tablet by mouth three times daily with meals   CHOLECALCIFEROL (VITAMIN D) 1000 UNITS TABLET    Take 1,000 Units by mouth daily.   DOCUSATE SODIUM (COLACE) 100 MG CAPSULE    Take 1 capsule (100 mg total) by mouth 2 (two) times daily.   DULOXETINE (CYMBALTA) 60 MG CAPSULE    Take one capsule by mouth twice daily for depression   FLUTICASONE (FLONASE) 50 MCG/ACT NASAL SPRAY    Place 1 spray into both nostrils daily as needed for allergies or rhinitis.   GABAPENTIN (NEURONTIN) 400 MG CAPSULE    Take 1 capsule (400 mg total) by mouth 2 (two) times daily.   HYDROCHLOROTHIAZIDE (HYDRODIURIL) 25 MG TABLET    Take 1 tablet by mouth in  the morning   LORAZEPAM (ATIVAN) 1 MG TABLET    Take 1 tablet (1 mg total) by mouth every 8 (eight) hours as needed for anxiety.   MECLIZINE (ANTIVERT) 25 MG TABLET    Take one tablet by mouth once daily as needed for dizziness.   MIRTAZAPINE (REMERON) 15 MG TABLET    Take one tablet by mouth every night at bedtime    NYSTATIN (MYCOSTATIN/NYSTOP) 100000 UNIT/GM POWD    Apply beneath left breast for yeast rash   OXYCODONE (OXY IR/ROXICODONE) 5 MG IMMEDIATE RELEASE TABLET    Take 1 tablet (5 mg total) by mouth every 6 (six) hours as needed for moderate pain or severe pain.   PHENYLEPHRINE HCL (AFRIN ALLERGY NA)    Place 2 sprays into the nose daily as needed (Nasal congestion).   PYRIDOXINE (VITAMIN B-6) 50 MG TABLET    Take  50 mg by mouth daily.   SENNA (SENOKOT) 8.6 MG TABS TABLET    Take 2 tablets (17.2 mg total) by mouth 2 (two) times daily.   SUMATRIPTAN (IMITREX) 50 MG TABLET    See AUX Label   TEMAZEPAM (RESTORIL) 7.5 MG CAPSULE    Take 1 capsule (7.5 mg total) by mouth at bedtime as needed for sleep.  Modified Medications   No medications on file  Discontinued Medications   No medications on file     Physical Exam: Filed Vitals:   09/22/14 1250  BP: 140/68  Pulse: 88  Temp: 98 F (36.7 C)  Resp: 18  Weight: 219 lb 12.8 oz (99.701 kg)  SpO2: 97%    General- elderly female, obese, in no acute distress Head- normocephalic, atraumatic Throat- moist mucus membrane Eyes- PERRLA, EOMI, no pallor, no icterus, no discharge Neck- no cervical lymphadenopathy Cardiovascular- normal s1,s2, no murmurs, palpable dorsalis pedis and radial pulses, trace leg edema Respiratory- bilateral clear to auscultation, no wheeze, no rhonchi, no crackles, no use of accessory muscles Abdomen- bowel sounds present, soft, non tender Musculoskeletal- able to move all 4 extremities, right leg in cast, able to move her toes Neurological- no focal deficit Skin- warm and dry Psychiatry- alert and oriented to person, place and time, normal mood and affect   Labs reviewed: Basic Metabolic Panel:  Recent Labs  09/17/14 0500 09/19/14 0619 09/20/14 0810  NA 136 137 138  K 3.1* 2.7* 4.6  CL 92* 92* 97  CO2 31 33* 27  GLUCOSE 166* 150* 140*  BUN 14 10 13   CREATININE 1.17* 0.77 0.87  CALCIUM 9.3 9.6 9.8  MG  --   1.7  --    Liver Function Tests:  Recent Labs  01/11/14 1514 08/07/14 0614  AST 10 22  ALT <5 <5  ALKPHOS 117 80  BILITOT 0.2 1.0  PROT 7.0 8.2  ALBUMIN  --  4.4   No results for input(s): LIPASE, AMYLASE in the last 8760 hours. No results for input(s): AMMONIA in the last 8760 hours. CBC:  Recent Labs  01/11/14 1514 08/07/14 0614 09/14/14 1234 09/17/14 0500  WBC 12.4* 10.1 11.3* 10.7*  NEUTROABS 8.8* 6.6  --   --   HGB 12.7 14.2 11.9* 10.4*  HCT 38.2 43.5 37.7 33.5*  MCV 93 94.6 95.0 97.4  PLT  --  356 348 266   Cardiac Enzymes: No results for input(s): CKTOTAL, CKMB, CKMBINDEX, TROPONINI in the last 8760 hours. BNP: Invalid input(s): POCBNP CBG:  Recent Labs  09/17/14 1009  GLUCAP 121*    Assessment/Plan  Right ankle fracture  S/P ORIF. Will have her work with physical therapy and occupational therapy team to help with gait training and muscle strengthening exercises.fall precautions. Skin care. Encourage to be out of bed.  Continue aspirin 325 mg daily for DVT prophylaxis. continue oxycodone 5 mg q6h prn pain. Has follow up with orthopedics. NWB in RLE. Continue calcium supplement with vitamin d  Neuropathy  Continue Neurontin 800 mg qid, stable  Blood loss anemia Post op, check cbc  Restless leg syndrome  Continue sinemet current regimen  Hypertension continue Hydrodiuril 25 mg daily for now, monitor bp , if > 140/90, consider restarting her home regimen norvasc at a low dose. Check bmp  Anxiety  continue Ativan 1 mg tid prn  Major Depression continue Remeron 15 mg daily at bedtime and Cymbalta 60 mg bid  Constipation continue senna 2 tabs bid and Colace 100 mg bid  Hyperlipidemia continue Lipitor 20 mg daily   Goals of care: short term rehabilitation    Labs/tests ordered:  Family/ staff Communication: reviewed care plan with patient and nursing supervisor    Blanchie Serve, MD  [facility] [phone]  (Monday-Friday 8 am - 5 pm) [phone] (afterhours)

## 2014-09-24 ENCOUNTER — Other Ambulatory Visit: Payer: Self-pay

## 2014-09-24 NOTE — Telephone Encounter (Signed)
Patient is currently at [facility] for rehab. Two refill request came over from [facility] for Temazepam and Lorazepam. Medications will be filled at nursing home while patient is there vs [facility]

## 2014-09-28 DIAGNOSIS — S82451D Displaced comminuted fracture of shaft of right fibula, subsequent encounter for closed fracture with routine healing: Secondary | ICD-10-CM | POA: Diagnosis not present

## 2014-10-08 ENCOUNTER — Encounter: Payer: Self-pay | Admitting: Adult Health

## 2014-10-08 ENCOUNTER — Non-Acute Institutional Stay (SKILLED_NURSING_FACILITY): Payer: Medicare Other | Admitting: Adult Health

## 2014-10-08 DIAGNOSIS — G2581 Restless legs syndrome: Secondary | ICD-10-CM | POA: Diagnosis not present

## 2014-10-08 DIAGNOSIS — S82891S Other fracture of right lower leg, sequela: Secondary | ICD-10-CM | POA: Diagnosis not present

## 2014-10-08 DIAGNOSIS — E785 Hyperlipidemia, unspecified: Secondary | ICD-10-CM | POA: Diagnosis not present

## 2014-10-08 DIAGNOSIS — G629 Polyneuropathy, unspecified: Secondary | ICD-10-CM | POA: Diagnosis not present

## 2014-10-08 DIAGNOSIS — F419 Anxiety disorder, unspecified: Secondary | ICD-10-CM

## 2014-10-08 DIAGNOSIS — F329 Major depressive disorder, single episode, unspecified: Secondary | ICD-10-CM

## 2014-10-08 DIAGNOSIS — I10 Essential (primary) hypertension: Secondary | ICD-10-CM

## 2014-10-08 DIAGNOSIS — K5909 Other constipation: Secondary | ICD-10-CM

## 2014-10-08 DIAGNOSIS — F32A Depression, unspecified: Secondary | ICD-10-CM

## 2014-10-09 NOTE — Progress Notes (Signed)
Patient ID: [name], female   DOB: Sep 13, 1940, 74 y.o.   MRN: [id number]   10/08/14  Facility:  Nursing Home Location:  [place] Room Number: 1008-P LEVEL OF CARE:  SNF (31)  Chief Complaint  Patient presents with  . Hospitalization Follow-up    Right ankle fracture S/P ORIF, neuropathy, hypertension, anxiety, depression, constipation, restless leg syndrome and hyperlipidemia    HISTORY OF PRESENT ILLNESS:  This is a 74 year old female who is for discharge home with Home health PT for transfers, strengthening, to progress to gait, as weight bearing changes, OT for self-care and home management and Home health aide for bathing and ADL assistance. DME:  20" X 18" lightweight wheelchair, with elevating leg rests, removable arms and wheelchair cushion. She has been admitted to [facility] on 09/20/14 from [facility]. She has PMH of hypertension, restless leg syndrome, hyperlipidemia, major depression, hypokalemia and constipation. She had a fall at home and sustained a right ankle fracture. She had ORIF done on 4/12. On postop day 3 she developed hypoxemia and lethargy. Spiral CT was negative for PE. It was determined that she had respiratory depression from medications and likely undiagnosed sleep apnea. Her medications were adjusted.   Patient was admitted to this facility for short-term rehabilitation after the patient's recent hospitalization.  Patient has completed SNF rehabilitation and therapy has cleared the patient for discharge.    PAST MEDICAL HISTORY:  Past Medical History  Diagnosis Date  . Hypertension   . GERD (gastroesophageal reflux disease)     hx pud '98  . Arthritis     djd  . Restless leg syndrome   . 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 Dysrhythmia     hx tachy palpitations  . Headache(784.0)     remote hx of migraines  . Neuromuscular disorder     peripheral neuropathy, FEET AND LEGS  . Depression       bh admission '09  . Stroke     ? 6 months ago - tests okay - [facility]   . Cellulitis     10/11/11 hospitalized for cellulitis   . Hyperlipemia   . Peripheral neuropathy   . Elbow fracture, left April 2015    CURRENT MEDICATIONS: Reviewed per MAR/see medication list  Allergies  Allergen Reactions  . Sulfa Drugs Cross Reactors Nausea And Vomiting     REVIEW OF SYSTEMS:  GENERAL: no change in appetite, no fatigue, no weight changes, no fever, chills or weakness RESPIRATORY: no cough, SOB, DOE, wheezing, hemoptysis CARDIAC: no chest pain, edema or palpitations GI: no abdominal pain, diarrhea, constipation, heart burn, nausea or vomiting  PHYSICAL EXAMINATION  GENERAL: no acute distress, obese NECK: supple, trachea midline, no neck masses, no thyroid tenderness, no thyromegaly LYMPHATICS: no LAN in the neck, no supraclavicular LAN RESPIRATORY: breathing is even & unlabored, BS CTAB CARDIAC: RRR, no murmur,no extra heart sounds, no edema GI: abdomen soft, normal BS, no masses, no tenderness, no hepatomegaly, no splenomegaly EXTREMITIES: Able to move 4 extremities; right lower leg has cast; able to wiggle toes PSYCHIATRIC: the patient is alert & oriented to person, affect & behavior appropriate  LABS/RADIOLOGY: Labs reviewed: Basic Metabolic Panel:  Recent Labs  09/17/14 0500 09/19/14 0619 09/20/14 0810  NA 136 137 138  K 3.1* 2.7* 4.6  CL 92* 92* 97  CO2 31 33* 27  GLUCOSE 166* 150* 140*  BUN  14 10 13   CREATININE 1.17* 0.77 0.87  CALCIUM 9.3 9.6 9.8  MG  --  1.7  --    Liver Function Tests:  Recent Labs  01/11/14 1514 08/07/14 0614  AST 10 22  ALT <5 <5  ALKPHOS 117 80  BILITOT 0.2 1.0  PROT 7.0 8.2  ALBUMIN  --  4.4   CBC:  Recent Labs  01/11/14 1514 08/07/14 0614 09/14/14 1234 09/17/14 0500  WBC 12.4* 10.1 11.3* 10.7*  NEUTROABS 8.8* 6.6  --   --   HGB 12.7 14.2 11.9* 10.4*  HCT 38.2 43.5 37.7 33.5*  MCV 93 94.6 95.0 97.4  PLT  --  356 348  266   Lipid Panel:  Recent Labs  01/11/14 1514  HDL 40   CBG:  Recent Labs  09/17/14 1009  GLUCAP 121*    Ct Angio Chest Pe W/cm &/or Wo Cm  09/17/2014   CLINICAL DATA:  Hypoxia.  EXAM: CT ANGIOGRAPHY CHEST WITH CONTRAST  TECHNIQUE: Multidetector CT imaging of the chest was performed using the standard protocol during bolus administration of intravenous contrast. Multiplanar CT image reconstructions and MIPs were obtained to evaluate the vascular anatomy.  CONTRAST:  49mL OMNIPAQUE IOHEXOL 350 MG/ML SOLN  COMPARISON:  None.  FINDINGS: The pulmonary arteries are well opacified. There is no evidence of pulmonary embolism. The thoracic aorta is of normal caliber and shows no evidence of aneurysm or dissection. Proximal great vessels show bovine branching anatomy and are normally patent.  The heart size is normal. No pericardial fluid identified. There is no evidence of pulmonary edema, consolidation, pneumothorax, nodule or pleural fluid.  No enlarged lymph nodes are seen. No evidence of airway obstruction. Mild bibasilar atelectasis present. Visualized upper abdominal structures are unremarkable. The bony thorax shows mild thoracic spondylosis.  Review of the MIP images confirms the above findings.  IMPRESSION: No evidence of pulmonary embolism or other acute findings in the chest.   Electronically Signed   By: Aletta Edouard M.D.   On: 09/17/2014 07:40    ASSESSMENT/PLAN:  Right ankle fracture S/P ORIF - for Home health PT, OT and Home health aide; continue aspirin 325 mg 1 tab by mouth daily for DVT prophylaxis; oxycodone 5 mg 1 tab by mouth every 6 hours when necessary for pain; and Robaxin 500 mg PO Q 8 hours PRN Neuropathy - continue Neurontin to 800 mg 1 tab by mouth 4 times a day Restless leg syndrome - change Sinemet to 50/200 mg CR one tab by mouth daily at bedtime and Sinemet 25/250 mg 1 tab by mouth 3 times a day Hypertension - well controlled; continue Hydrodiuril 25 mg by mouth  daily Anxiety  - continue Ativan 1 mg by mouth every 8 hours when necessary  Major Depression - mood is stable; continue Remeron 15 mg by mouth daily at bedtime and Cymbalta 60 mg by mouth twice a day Constipation - continue senna 2 tabs by mouth twice a day and Colace 100 mg by mouth twice a day Hyperlipidemia - continue Lipitor 20 mg by mouth daily at bedtime    I have filled out patient's discharge paperwork and written prescriptions.  Patient will receive home health PT, OT and Home health aide.  DME provided: 20" X 18" lightweight wheelchair, with elevating leg rests, removable arms and wheelchair cushion  Total discharge time: Greater/less than 30 minutes  Discharge time involved coordination of the discharge process with social worker, nursing staff and therapy department. Medical justification for  home health services/DME verified.    [facility], NP [employer] [phone]

## 2014-10-12 DIAGNOSIS — M5136 Other intervertebral disc degeneration, lumbar region: Secondary | ICD-10-CM | POA: Diagnosis not present

## 2014-10-12 DIAGNOSIS — K219 Gastro-esophageal reflux disease without esophagitis: Secondary | ICD-10-CM | POA: Diagnosis not present

## 2014-10-12 DIAGNOSIS — G629 Polyneuropathy, unspecified: Secondary | ICD-10-CM | POA: Diagnosis not present

## 2014-10-12 DIAGNOSIS — G2581 Restless legs syndrome: Secondary | ICD-10-CM | POA: Diagnosis not present

## 2014-10-12 DIAGNOSIS — Z87440 Personal history of urinary (tract) infections: Secondary | ICD-10-CM | POA: Diagnosis not present

## 2014-10-12 DIAGNOSIS — I10 Essential (primary) hypertension: Secondary | ICD-10-CM | POA: Diagnosis not present

## 2014-10-12 DIAGNOSIS — Z8673 Personal history of transient ischemic attack (TIA), and cerebral infarction without residual deficits: Secondary | ICD-10-CM | POA: Diagnosis not present

## 2014-10-12 DIAGNOSIS — E785 Hyperlipidemia, unspecified: Secondary | ICD-10-CM | POA: Diagnosis not present

## 2014-10-12 DIAGNOSIS — E669 Obesity, unspecified: Secondary | ICD-10-CM | POA: Diagnosis not present

## 2014-10-12 DIAGNOSIS — F329 Major depressive disorder, single episode, unspecified: Secondary | ICD-10-CM | POA: Diagnosis not present

## 2014-10-12 DIAGNOSIS — Z9181 History of falling: Secondary | ICD-10-CM | POA: Diagnosis not present

## 2014-10-12 DIAGNOSIS — M1990 Unspecified osteoarthritis, unspecified site: Secondary | ICD-10-CM | POA: Diagnosis not present

## 2014-10-12 DIAGNOSIS — S8262XD Displaced fracture of lateral malleolus of left fibula, subsequent encounter for closed fracture with routine healing: Secondary | ICD-10-CM | POA: Diagnosis not present

## 2014-10-14 DIAGNOSIS — Z8673 Personal history of transient ischemic attack (TIA), and cerebral infarction without residual deficits: Secondary | ICD-10-CM | POA: Diagnosis not present

## 2014-10-14 DIAGNOSIS — S8262XD Displaced fracture of lateral malleolus of left fibula, subsequent encounter for closed fracture with routine healing: Secondary | ICD-10-CM | POA: Diagnosis not present

## 2014-10-14 DIAGNOSIS — M1990 Unspecified osteoarthritis, unspecified site: Secondary | ICD-10-CM | POA: Diagnosis not present

## 2014-10-14 DIAGNOSIS — F329 Major depressive disorder, single episode, unspecified: Secondary | ICD-10-CM | POA: Diagnosis not present

## 2014-10-14 DIAGNOSIS — E785 Hyperlipidemia, unspecified: Secondary | ICD-10-CM | POA: Diagnosis not present

## 2014-10-14 DIAGNOSIS — M5136 Other intervertebral disc degeneration, lumbar region: Secondary | ICD-10-CM | POA: Diagnosis not present

## 2014-10-14 DIAGNOSIS — E669 Obesity, unspecified: Secondary | ICD-10-CM | POA: Diagnosis not present

## 2014-10-14 DIAGNOSIS — K219 Gastro-esophageal reflux disease without esophagitis: Secondary | ICD-10-CM | POA: Diagnosis not present

## 2014-10-14 DIAGNOSIS — I10 Essential (primary) hypertension: Secondary | ICD-10-CM | POA: Diagnosis not present

## 2014-10-14 DIAGNOSIS — G629 Polyneuropathy, unspecified: Secondary | ICD-10-CM | POA: Diagnosis not present

## 2014-10-14 DIAGNOSIS — Z9181 History of falling: Secondary | ICD-10-CM | POA: Diagnosis not present

## 2014-10-14 DIAGNOSIS — Z87440 Personal history of urinary (tract) infections: Secondary | ICD-10-CM | POA: Diagnosis not present

## 2014-10-14 DIAGNOSIS — G2581 Restless legs syndrome: Secondary | ICD-10-CM | POA: Diagnosis not present

## 2014-10-18 DIAGNOSIS — Z8673 Personal history of transient ischemic attack (TIA), and cerebral infarction without residual deficits: Secondary | ICD-10-CM | POA: Diagnosis not present

## 2014-10-18 DIAGNOSIS — I10 Essential (primary) hypertension: Secondary | ICD-10-CM | POA: Diagnosis not present

## 2014-10-18 DIAGNOSIS — K219 Gastro-esophageal reflux disease without esophagitis: Secondary | ICD-10-CM | POA: Diagnosis not present

## 2014-10-18 DIAGNOSIS — E785 Hyperlipidemia, unspecified: Secondary | ICD-10-CM | POA: Diagnosis not present

## 2014-10-18 DIAGNOSIS — M5136 Other intervertebral disc degeneration, lumbar region: Secondary | ICD-10-CM | POA: Diagnosis not present

## 2014-10-18 DIAGNOSIS — F329 Major depressive disorder, single episode, unspecified: Secondary | ICD-10-CM | POA: Diagnosis not present

## 2014-10-18 DIAGNOSIS — Z87440 Personal history of urinary (tract) infections: Secondary | ICD-10-CM | POA: Diagnosis not present

## 2014-10-18 DIAGNOSIS — Z9181 History of falling: Secondary | ICD-10-CM | POA: Diagnosis not present

## 2014-10-18 DIAGNOSIS — E669 Obesity, unspecified: Secondary | ICD-10-CM | POA: Diagnosis not present

## 2014-10-18 DIAGNOSIS — G629 Polyneuropathy, unspecified: Secondary | ICD-10-CM | POA: Diagnosis not present

## 2014-10-18 DIAGNOSIS — S8262XD Displaced fracture of lateral malleolus of left fibula, subsequent encounter for closed fracture with routine healing: Secondary | ICD-10-CM | POA: Diagnosis not present

## 2014-10-18 DIAGNOSIS — M1990 Unspecified osteoarthritis, unspecified site: Secondary | ICD-10-CM | POA: Diagnosis not present

## 2014-10-18 DIAGNOSIS — G2581 Restless legs syndrome: Secondary | ICD-10-CM | POA: Diagnosis not present

## 2014-10-19 DIAGNOSIS — S8262XD Displaced fracture of lateral malleolus of left fibula, subsequent encounter for closed fracture with routine healing: Secondary | ICD-10-CM | POA: Diagnosis not present

## 2014-10-19 DIAGNOSIS — Z8673 Personal history of transient ischemic attack (TIA), and cerebral infarction without residual deficits: Secondary | ICD-10-CM | POA: Diagnosis not present

## 2014-10-19 DIAGNOSIS — M5136 Other intervertebral disc degeneration, lumbar region: Secondary | ICD-10-CM | POA: Diagnosis not present

## 2014-10-19 DIAGNOSIS — K219 Gastro-esophageal reflux disease without esophagitis: Secondary | ICD-10-CM | POA: Diagnosis not present

## 2014-10-19 DIAGNOSIS — G629 Polyneuropathy, unspecified: Secondary | ICD-10-CM | POA: Diagnosis not present

## 2014-10-19 DIAGNOSIS — G2581 Restless legs syndrome: Secondary | ICD-10-CM | POA: Diagnosis not present

## 2014-10-19 DIAGNOSIS — E669 Obesity, unspecified: Secondary | ICD-10-CM | POA: Diagnosis not present

## 2014-10-19 DIAGNOSIS — Z87440 Personal history of urinary (tract) infections: Secondary | ICD-10-CM | POA: Diagnosis not present

## 2014-10-19 DIAGNOSIS — M1990 Unspecified osteoarthritis, unspecified site: Secondary | ICD-10-CM | POA: Diagnosis not present

## 2014-10-19 DIAGNOSIS — Z9181 History of falling: Secondary | ICD-10-CM | POA: Diagnosis not present

## 2014-10-19 DIAGNOSIS — F329 Major depressive disorder, single episode, unspecified: Secondary | ICD-10-CM | POA: Diagnosis not present

## 2014-10-19 DIAGNOSIS — I10 Essential (primary) hypertension: Secondary | ICD-10-CM | POA: Diagnosis not present

## 2014-10-19 DIAGNOSIS — E785 Hyperlipidemia, unspecified: Secondary | ICD-10-CM | POA: Diagnosis not present

## 2014-10-21 DIAGNOSIS — M5136 Other intervertebral disc degeneration, lumbar region: Secondary | ICD-10-CM | POA: Diagnosis not present

## 2014-10-21 DIAGNOSIS — Z8673 Personal history of transient ischemic attack (TIA), and cerebral infarction without residual deficits: Secondary | ICD-10-CM | POA: Diagnosis not present

## 2014-10-21 DIAGNOSIS — E785 Hyperlipidemia, unspecified: Secondary | ICD-10-CM | POA: Diagnosis not present

## 2014-10-21 DIAGNOSIS — M1990 Unspecified osteoarthritis, unspecified site: Secondary | ICD-10-CM | POA: Diagnosis not present

## 2014-10-21 DIAGNOSIS — S8262XD Displaced fracture of lateral malleolus of left fibula, subsequent encounter for closed fracture with routine healing: Secondary | ICD-10-CM | POA: Diagnosis not present

## 2014-10-21 DIAGNOSIS — Z9181 History of falling: Secondary | ICD-10-CM | POA: Diagnosis not present

## 2014-10-21 DIAGNOSIS — K219 Gastro-esophageal reflux disease without esophagitis: Secondary | ICD-10-CM | POA: Diagnosis not present

## 2014-10-21 DIAGNOSIS — F329 Major depressive disorder, single episode, unspecified: Secondary | ICD-10-CM | POA: Diagnosis not present

## 2014-10-21 DIAGNOSIS — E669 Obesity, unspecified: Secondary | ICD-10-CM | POA: Diagnosis not present

## 2014-10-21 DIAGNOSIS — G2581 Restless legs syndrome: Secondary | ICD-10-CM | POA: Diagnosis not present

## 2014-10-21 DIAGNOSIS — Z87440 Personal history of urinary (tract) infections: Secondary | ICD-10-CM | POA: Diagnosis not present

## 2014-10-21 DIAGNOSIS — I10 Essential (primary) hypertension: Secondary | ICD-10-CM | POA: Diagnosis not present

## 2014-10-21 DIAGNOSIS — G629 Polyneuropathy, unspecified: Secondary | ICD-10-CM | POA: Diagnosis not present

## 2014-10-25 ENCOUNTER — Other Ambulatory Visit: Payer: Self-pay | Admitting: *Deleted

## 2014-10-25 MED ORDER — TEMAZEPAM 7.5 MG PO CAPS: 7.5000 mg | ORAL_CAPSULE | Freq: Every evening | ORAL | Status: DC | PRN

## 2014-10-25 MED ORDER — GABAPENTIN 400 MG PO CAPS: 400.0000 mg | ORAL_CAPSULE | Freq: Two times a day (BID) | ORAL | Status: DC

## 2014-10-25 MED ORDER — LORAZEPAM 1 MG PO TABS: 1.0000 mg | ORAL_TABLET | Freq: Three times a day (TID) | ORAL | Status: DC | PRN

## 2014-10-25 NOTE — Telephone Encounter (Signed)
[place]

## 2014-10-27 DIAGNOSIS — K219 Gastro-esophageal reflux disease without esophagitis: Secondary | ICD-10-CM | POA: Diagnosis not present

## 2014-10-27 DIAGNOSIS — Z9181 History of falling: Secondary | ICD-10-CM | POA: Diagnosis not present

## 2014-10-27 DIAGNOSIS — E669 Obesity, unspecified: Secondary | ICD-10-CM | POA: Diagnosis not present

## 2014-10-27 DIAGNOSIS — S82451D Displaced comminuted fracture of shaft of right fibula, subsequent encounter for closed fracture with routine healing: Secondary | ICD-10-CM | POA: Diagnosis not present

## 2014-10-27 DIAGNOSIS — M5136 Other intervertebral disc degeneration, lumbar region: Secondary | ICD-10-CM | POA: Diagnosis not present

## 2014-10-27 DIAGNOSIS — Z8673 Personal history of transient ischemic attack (TIA), and cerebral infarction without residual deficits: Secondary | ICD-10-CM | POA: Diagnosis not present

## 2014-10-27 DIAGNOSIS — S8262XD Displaced fracture of lateral malleolus of left fibula, subsequent encounter for closed fracture with routine healing: Secondary | ICD-10-CM | POA: Diagnosis not present

## 2014-10-27 DIAGNOSIS — E785 Hyperlipidemia, unspecified: Secondary | ICD-10-CM | POA: Diagnosis not present

## 2014-10-27 DIAGNOSIS — M1990 Unspecified osteoarthritis, unspecified site: Secondary | ICD-10-CM | POA: Diagnosis not present

## 2014-10-27 DIAGNOSIS — Z87440 Personal history of urinary (tract) infections: Secondary | ICD-10-CM | POA: Diagnosis not present

## 2014-10-27 DIAGNOSIS — I10 Essential (primary) hypertension: Secondary | ICD-10-CM | POA: Diagnosis not present

## 2014-10-27 DIAGNOSIS — F329 Major depressive disorder, single episode, unspecified: Secondary | ICD-10-CM | POA: Diagnosis not present

## 2014-10-27 DIAGNOSIS — G629 Polyneuropathy, unspecified: Secondary | ICD-10-CM | POA: Diagnosis not present

## 2014-10-27 DIAGNOSIS — G2581 Restless legs syndrome: Secondary | ICD-10-CM | POA: Diagnosis not present

## 2014-11-02 DIAGNOSIS — G2581 Restless legs syndrome: Secondary | ICD-10-CM | POA: Diagnosis not present

## 2014-11-02 DIAGNOSIS — I10 Essential (primary) hypertension: Secondary | ICD-10-CM | POA: Diagnosis not present

## 2014-11-02 DIAGNOSIS — Z8673 Personal history of transient ischemic attack (TIA), and cerebral infarction without residual deficits: Secondary | ICD-10-CM | POA: Diagnosis not present

## 2014-11-02 DIAGNOSIS — E785 Hyperlipidemia, unspecified: Secondary | ICD-10-CM | POA: Diagnosis not present

## 2014-11-02 DIAGNOSIS — E669 Obesity, unspecified: Secondary | ICD-10-CM | POA: Diagnosis not present

## 2014-11-02 DIAGNOSIS — K219 Gastro-esophageal reflux disease without esophagitis: Secondary | ICD-10-CM | POA: Diagnosis not present

## 2014-11-02 DIAGNOSIS — Z9181 History of falling: Secondary | ICD-10-CM | POA: Diagnosis not present

## 2014-11-02 DIAGNOSIS — S8262XD Displaced fracture of lateral malleolus of left fibula, subsequent encounter for closed fracture with routine healing: Secondary | ICD-10-CM | POA: Diagnosis not present

## 2014-11-02 DIAGNOSIS — M5136 Other intervertebral disc degeneration, lumbar region: Secondary | ICD-10-CM | POA: Diagnosis not present

## 2014-11-02 DIAGNOSIS — F329 Major depressive disorder, single episode, unspecified: Secondary | ICD-10-CM | POA: Diagnosis not present

## 2014-11-02 DIAGNOSIS — Z87440 Personal history of urinary (tract) infections: Secondary | ICD-10-CM | POA: Diagnosis not present

## 2014-11-02 DIAGNOSIS — G629 Polyneuropathy, unspecified: Secondary | ICD-10-CM | POA: Diagnosis not present

## 2014-11-02 DIAGNOSIS — M1990 Unspecified osteoarthritis, unspecified site: Secondary | ICD-10-CM | POA: Diagnosis not present

## 2014-11-05 ENCOUNTER — Encounter (HOSPITAL_BASED_OUTPATIENT_CLINIC_OR_DEPARTMENT_OTHER): Payer: Self-pay | Admitting: *Deleted

## 2014-11-05 ENCOUNTER — Emergency Department (HOSPITAL_BASED_OUTPATIENT_CLINIC_OR_DEPARTMENT_OTHER)
Admission: EM | Admit: 2014-11-05 | Discharge: 2014-11-05 | Disposition: A | Discharge status: Home / Self Care | Payer: Medicare Other | Attending: Emergency Medicine | Admitting: Emergency Medicine

## 2014-11-05 ENCOUNTER — Emergency Department (HOSPITAL_BASED_OUTPATIENT_CLINIC_OR_DEPARTMENT_OTHER): Payer: Medicare Other

## 2014-11-05 DIAGNOSIS — Y998 Other external cause status: Secondary | ICD-10-CM | POA: Diagnosis not present

## 2014-11-05 DIAGNOSIS — Z7982 Long term (current) use of aspirin: Secondary | ICD-10-CM | POA: Diagnosis not present

## 2014-11-05 DIAGNOSIS — W050XXA Fall from non-moving wheelchair, initial encounter: Secondary | ICD-10-CM | POA: Diagnosis not present

## 2014-11-05 DIAGNOSIS — Z8673 Personal history of transient ischemic attack (TIA), and cerebral infarction without residual deficits: Secondary | ICD-10-CM | POA: Insufficient documentation

## 2014-11-05 DIAGNOSIS — M1990 Unspecified osteoarthritis, unspecified site: Secondary | ICD-10-CM | POA: Diagnosis not present

## 2014-11-05 DIAGNOSIS — G43909 Migraine, unspecified, not intractable, without status migrainosus: Secondary | ICD-10-CM | POA: Insufficient documentation

## 2014-11-05 DIAGNOSIS — S0990XA Unspecified injury of head, initial encounter: Secondary | ICD-10-CM

## 2014-11-05 DIAGNOSIS — Z8781 Personal history of (healed) traumatic fracture: Secondary | ICD-10-CM | POA: Insufficient documentation

## 2014-11-05 DIAGNOSIS — S199XXA Unspecified injury of neck, initial encounter: Secondary | ICD-10-CM | POA: Diagnosis not present

## 2014-11-05 DIAGNOSIS — Y9289 Other specified places as the place of occurrence of the external cause: Secondary | ICD-10-CM | POA: Insufficient documentation

## 2014-11-05 DIAGNOSIS — F329 Major depressive disorder, single episode, unspecified: Secondary | ICD-10-CM | POA: Diagnosis not present

## 2014-11-05 DIAGNOSIS — S0101XA Laceration without foreign body of scalp, initial encounter: Secondary | ICD-10-CM | POA: Diagnosis not present

## 2014-11-05 DIAGNOSIS — E785 Hyperlipidemia, unspecified: Secondary | ICD-10-CM | POA: Diagnosis not present

## 2014-11-05 DIAGNOSIS — Z872 Personal history of diseases of the skin and subcutaneous tissue: Secondary | ICD-10-CM | POA: Diagnosis not present

## 2014-11-05 DIAGNOSIS — S161XXA Strain of muscle, fascia and tendon at neck level, initial encounter: Secondary | ICD-10-CM | POA: Insufficient documentation

## 2014-11-05 DIAGNOSIS — K219 Gastro-esophageal reflux disease without esophagitis: Secondary | ICD-10-CM | POA: Diagnosis not present

## 2014-11-05 DIAGNOSIS — S098XXA Other specified injuries of head, initial encounter: Secondary | ICD-10-CM | POA: Diagnosis not present

## 2014-11-05 DIAGNOSIS — Z7951 Long term (current) use of inhaled steroids: Secondary | ICD-10-CM | POA: Insufficient documentation

## 2014-11-05 DIAGNOSIS — G709 Myoneural disorder, unspecified: Secondary | ICD-10-CM | POA: Insufficient documentation

## 2014-11-05 DIAGNOSIS — W19XXXA Unspecified fall, initial encounter: Secondary | ICD-10-CM

## 2014-11-05 DIAGNOSIS — I10 Essential (primary) hypertension: Secondary | ICD-10-CM | POA: Diagnosis not present

## 2014-11-05 DIAGNOSIS — M542 Cervicalgia: Secondary | ICD-10-CM | POA: Diagnosis not present

## 2014-11-05 DIAGNOSIS — G2581 Restless legs syndrome: Secondary | ICD-10-CM | POA: Diagnosis not present

## 2014-11-05 DIAGNOSIS — Z9889 Other specified postprocedural states: Secondary | ICD-10-CM | POA: Diagnosis not present

## 2014-11-05 DIAGNOSIS — Z79899 Other long term (current) drug therapy: Secondary | ICD-10-CM | POA: Insufficient documentation

## 2014-11-05 DIAGNOSIS — G629 Polyneuropathy, unspecified: Secondary | ICD-10-CM | POA: Diagnosis not present

## 2014-11-05 DIAGNOSIS — Y9389 Activity, other specified: Secondary | ICD-10-CM | POA: Diagnosis not present

## 2014-11-05 MED ORDER — ACETAMINOPHEN 500 MG PO TABS
1000.0000 mg | ORAL_TABLET | Freq: Once | ORAL | Status: AC
  Administered 2014-11-05: 1000 mg via ORAL
  Filled 2014-11-05: qty 2

## 2014-11-05 NOTE — ED Notes (Signed)
Patient transported to CT 

## 2014-11-05 NOTE — ED Notes (Signed)
EMS reports pt lost her balance and fell backwards, striking the back of her head on her wheelchair. Per EMS, pt has a hematoma and small lac on the lower, left occipital region. Pt denies LOC.

## 2014-11-05 NOTE — ED Notes (Signed)
I cleaned blood from patient's hair, there is a laceration about 1/2 inch to her left occipital of head, just below left ear.

## 2014-11-05 NOTE — ED Notes (Signed)
Called pts son, Konrad Dolores, because pt will shortly be discharged.  He said it will be 45 min to an hour before he can get here to pick her up.

## 2014-11-05 NOTE — Discharge Instructions (Signed)
Return to the emergency department for worsening headache, seizure activity, or other new and concerning symptoms.   Cervical Sprain A cervical sprain is an injury in the neck in which the strong, fibrous tissues (ligaments) that connect your neck bones stretch or tear. Cervical sprains can range from mild to severe. Severe cervical sprains can cause the neck vertebrae to be unstable. This can lead to damage of the spinal cord and can result in serious nervous system problems. The amount of time it takes for a cervical sprain to get better depends on the cause and extent of the injury. Most cervical sprains heal in 1 to 3 weeks. CAUSES  Severe cervical sprains may be caused by:   Contact sport injuries (such as from football, rugby, wrestling, hockey, auto racing, gymnastics, diving, martial arts, or boxing).   Motor vehicle collisions.   Whiplash injuries. This is an injury from a sudden forward and backward whipping movement of the head and neck.  Falls.  Mild cervical sprains may be caused by:   Being in an awkward position, such as while cradling a telephone between your ear and shoulder.   Sitting in a chair that does not offer proper support.   Working at a poorly Landscape architect station.   Looking up or down for long periods of time.  SYMPTOMS   Pain, soreness, stiffness, or a burning sensation in the front, back, or sides of the neck. This discomfort may develop immediately after the injury or slowly, 24 hours or more after the injury.   Pain or tenderness directly in the middle of the back of the neck.   Shoulder or upper back pain.   Limited ability to move the neck.   Headache.   Dizziness.   Weakness, numbness, or tingling in the hands or arms.   Muscle spasms.   Difficulty swallowing or chewing.   Tenderness and swelling of the neck.  DIAGNOSIS  Most of the time your health care provider can diagnose a cervical sprain by taking your  history and doing a physical exam. Your health care provider will ask about previous neck injuries and any known neck problems, such as arthritis in the neck. X-rays may be taken to find out if there are any other problems, such as with the bones of the neck. Other tests, such as a CT scan or MRI, may also be needed.  TREATMENT  Treatment depends on the severity of the cervical sprain. Mild sprains can be treated with rest, keeping the neck in place (immobilization), and pain medicines. Severe cervical sprains are immediately immobilized. Further treatment is done to help with pain, muscle spasms, and other symptoms and may include:  Medicines, such as pain relievers, numbing medicines, or muscle relaxants.   Physical therapy. This may involve stretching exercises, strengthening exercises, and posture training. Exercises and improved posture can help stabilize the neck, strengthen muscles, and help stop symptoms from returning.  HOME CARE INSTRUCTIONS   Put ice on the injured area.   Put ice in a plastic bag.   Place a towel between your skin and the bag.   Leave the ice on for 15-20 minutes, 3-4 times a day.   If your injury was severe, you may have been given a cervical collar to wear. A cervical collar is a two-piece collar designed to keep your neck from moving while it heals.  Do not remove the collar unless instructed by your health care provider.  If you have long hair, keep it  outside of the collar.  Ask your health care provider before making any adjustments to your collar. Minor adjustments may be required over time to improve comfort and reduce pressure on your chin or on the back of your head.  Ifyou are allowed to remove the collar for cleaning or bathing, follow your health care provider's instructions on how to do so safely.  Keep your collar clean by wiping it with mild soap and water and drying it completely. If the collar you have been given includes removable  pads, remove them every 1-2 days and hand wash them with soap and water. Allow them to air dry. They should be completely dry before you wear them in the collar.  If you are allowed to remove the collar for cleaning and bathing, wash and dry the skin of your neck. Check your skin for irritation or sores. If you see any, tell your health care provider.  Do not drive while wearing the collar.   Only take over-the-counter or prescription medicines for pain, discomfort, or fever as directed by your health care provider.   Keep all follow-up appointments as directed by your health care provider.   Keep all physical therapy appointments as directed by your health care provider.   Make any needed adjustments to your workstation to promote good posture.   Avoid positions and activities that make your symptoms worse.   Warm up and stretch before being active to help prevent problems.  SEEK MEDICAL CARE IF:   Your pain is not controlled with medicine.   You are unable to decrease your pain medicine over time as planned.   Your activity level is not improving as expected.  SEEK IMMEDIATE MEDICAL CARE IF:   You develop any bleeding.  You develop stomach upset.  You have signs of an allergic reaction to your medicine.   Your symptoms get worse.   You develop new, unexplained symptoms.   You have numbness, tingling, weakness, or paralysis in any part of your body.  MAKE SURE YOU:   Understand these instructions.  Will watch your condition.  Will get help right away if you are not doing well or get worse. Document Released: 03/18/2007 Document Revised: 05/26/2013 Document Reviewed: 11/26/2012 [facility] Patient Information 2015 [place], [place]. This information is not intended to replace advice given to you by your health care provider. Make sure you discuss any questions you have with your health care provider.  Head Injury You have received a head injury. It does not  appear serious at this time. Headaches and vomiting are common following head injury. It should be easy to awaken from sleeping. Sometimes it is necessary for you to stay in the emergency department for a while for observation. Sometimes admission to the hospital may be needed. After injuries such as yours, most problems occur within the first 24 hours, but side effects may occur up to 7-10 days after the injury. It is important for you to carefully monitor your condition and contact your health care provider or seek immediate medical care if there is a change in your condition. WHAT ARE THE TYPES OF HEAD INJURIES? Head injuries can be as minor as a bump. Some head injuries can be more severe. More severe head injuries include:  A jarring injury to the brain (concussion).  A bruise of the brain (contusion). This mean there is bleeding in the brain that can cause swelling.  A cracked skull (skull fracture).  Bleeding in the brain that collects, clots,  and forms a bump (hematoma). WHAT CAUSES A HEAD INJURY? A serious head injury is most likely to happen to someone who is in a car wreck and is not wearing a seat belt. Other causes of major head injuries include bicycle or motorcycle accidents, sports injuries, and falls. HOW ARE HEAD INJURIES DIAGNOSED? A complete history of the event leading to the injury and your current symptoms will be helpful in diagnosing head injuries. Many times, pictures of the brain, such as CT or MRI are needed to see the extent of the injury. Often, an overnight hospital stay is necessary for observation.  WHEN SHOULD I SEEK IMMEDIATE MEDICAL CARE?  You should get help right away if:  You have confusion or drowsiness.  You feel sick to your stomach (nauseous) or have continued, forceful vomiting.  You have dizziness or unsteadiness that is getting worse.  You have severe, continued headaches not relieved by medicine. Only take over-the-counter or prescription  medicines for pain, fever, or discomfort as directed by your health care provider.  You do not have normal function of the arms or legs or are unable to walk.  You notice changes in the black spots in the center of the colored part of your eye (pupil).  You have a clear or bloody fluid coming from your nose or ears.  You have a loss of vision. During the next 24 hours after the injury, you must stay with someone who can watch you for the warning signs. This person should contact local emergency services (911 in the [place] if you have seizures, you become unconscious, or you are unable to wake up. HOW CAN I PREVENT A HEAD INJURY IN THE FUTURE? The most important factor for preventing major head injuries is avoiding motor vehicle accidents. To minimize the potential for damage to your head, it is crucial to wear seat belts while riding in motor vehicles. Wearing helmets while bike riding and playing collision sports (like football) is also helpful. Also, avoiding dangerous activities around the house will further help reduce your risk of head injury.  WHEN CAN I RETURN TO NORMAL ACTIVITIES AND ATHLETICS? You should be reevaluated by your health care provider before returning to these activities. If you have any of the following symptoms, you should not return to activities or contact sports until 1 week after the symptoms have stopped:  Persistent headache.  Dizziness or vertigo.  Poor attention and concentration.  Confusion.  Memory problems.  Nausea or vomiting.  Fatigue or tire easily.  Irritability.  Intolerant of bright lights or loud noises.  Anxiety or depression.  Disturbed sleep. MAKE SURE YOU:   Understand these instructions.  Will watch your condition.  Will get help right away if you are not doing well or get worse. Document Released: 05/21/2005 Document Revised: 05/26/2013 Document Reviewed: 01/26/2013 [facility] Patient Information 2015 [place], [place]. This  information is not intended to replace advice given to you by your health care provider. Make sure you discuss any questions you have with your health care provider.

## 2014-11-05 NOTE — ED Provider Notes (Signed)
CSN: [id number]     Arrival date & time 11/05/14  1940 History  This chart was scribed for Isabella Speak, MD by Delphia Grates, ED Scribe. This patient was seen in room MH10/MH10 and the patient's care was started at 7:53 PM.     Chief Complaint  Patient presents with  . Fall    The history is provided by the patient. No language interpreter was used.     HPI Comments: [name] is a 74 y.o. female brought in by ambulance, who presents to the Emergency Department complaining of a fall that occurred PTA. Patient states she lost her balance upon standing from her wheelchair and fell backward, striking the back of her head against a metal component of the wheelchair. She denies LOC. There is an associated small laceration to the back of the head with controlled bleeding. She denies any other injuries. Patient resides at home by herself.   Past Medical History  Diagnosis Date  . Hypertension   . GERD (gastroesophageal reflux disease)     hx pud '98  . Arthritis     djd  . Restless leg syndrome   . 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 Dysrhythmia     hx tachy palpitations  . Headache(784.0)     remote hx of migraines  . Neuromuscular disorder     peripheral neuropathy, FEET AND LEGS  . Depression     bh admission '09  . Stroke     ? 6 months ago - tests okay - [facility]   . Cellulitis     10/11/11 hospitalized for cellulitis   . Hyperlipemia   . Peripheral neuropathy   . Elbow fracture, left April 2015   Past Surgical History  Procedure Laterality Date  . Cervical disc surgery x2  2011  . Rt carotid enarterectomy  2011  . Rt knee arthroscopy  '99  . Left knee arthroscopy  2001  . Hematoma evacuation  2011    s/p rt cea  . Joint replacement  '01    total knee replacement, LEFT  . Abdominal hysterectomy  1975    bil oophorectomy  . Back surgery   MANY YRS AGO    laminectomy x3  . Cholecystectomy  1993  . Total knee arthroplasty  09/13/2011    Procedure:  TOTAL KNEE ARTHROPLASTY;  Surgeon: Magnus Sinning, MD;  Location: [facility];  Service: Orthopedics;  Laterality: Right;  . Right knee replacement      09/2011   . Carpal tunnel release  07/09/2012    Procedure: CARPAL TUNNEL RELEASE;  Surgeon: Magnus Sinning, MD;  Location: [facility];  Service: Orthopedics;  Laterality: Left;  . Finger arthroplasty  07/09/2012    Procedure: FINGER ARTHROPLASTY;  Surgeon: Magnus Sinning, MD;  Location: [facility];  Service: Orthopedics;  Laterality: Left;  Interposition Arthroplasty [facility] Joint Thumb Left   . Lipoma excision  07/09/2012    Procedure: EXCISION LIPOMA;  Surgeon: Magnus Sinning, MD;  Location: [facility];  Service: Orthopedics;  Laterality: Left;  Excision Lipoma Dorsum Left Wrist   . Orif ankle fracture Right 09/14/2014    FIBULA   . Orif ankle fracture Right 09/14/2014    Procedure: OPEN REDUCTION INTERNAL FIXATION (ORIF) RIGHT ANKLE FRACTURE/SYNDESMOSIS ;  Surgeon: Wylene Simmer, MD;  Location: [place];  Service: Orthopedics;  Laterality: Right;   Family History  Problem Relation Age of Onset  . Congestive Heart  Failure Father   . Congestive Heart Failure Sister   . Alzheimer's disease Mother   . Diabetes Brother   . Arthritis Brother    History  Substance Use Topics  . Smoking status: Never Smoker   . Smokeless tobacco: Never Used  . Alcohol Use: No   OB History    No data available     Review of Systems  A complete 10 system review of systems was obtained and all systems are negative except as noted in the HPI and PMH.   Allergies  Sulfa drugs cross reactors  Home Medications   Prior to Admission medications   Medication Sig Start Date End Date Taking? Authorizing Provider  acetaminophen (TYLENOL) 325 MG tablet Take 2 tablets (650 mg total) by mouth every 6 (six) hours as needed for mild pain (or Fever >/= 101). 09/16/14   Wylene Simmer, MD  amoxicillin (AMOXIL) 500 MG capsule Take 2,000 mg by mouth See admin instructions. Must take 4  capsules 1 hour before dental procedures    Historical Provider, MD  aspirin 325 MG tablet Take 325 mg by mouth daily.    Historical Provider, MD  atorvastatin (LIPITOR) 20 MG tablet Take 1 tablet by mouth at  bedtime 06/21/14   [name] L [name], DO  calcium carbonate 1250 MG capsule Take 1,250 mg by mouth daily.     Historical Provider, MD  carbidopa-levodopa (SINEMET CR) 50-200 MG per tablet Take 1 tablet by mouth at bedtime. 09/02/14   [name] L [name], DO  carbidopa-levodopa (SINEMET IR) 25-250 MG per tablet Take one tablet by mouth three times daily with meals 09/16/14   Wylene Simmer, MD  cholecalciferol (VITAMIN D) 1000 UNITS tablet Take 1,000 Units by mouth daily.    Historical Provider, MD  docusate sodium (COLACE) 100 MG capsule Take 1 capsule (100 mg total) by mouth 2 (two) times daily. 09/16/14   Wylene Simmer, MD  DULoxetine (CYMBALTA) 60 MG capsule Take one capsule by mouth twice daily for depression 09/08/14   Estill Dooms, MD  fluticasone [facility]) 50 MCG/ACT nasal spray Place 1 spray into both nostrils daily as needed for allergies or rhinitis.    Historical Provider, MD  gabapentin (NEURONTIN) 400 MG capsule Take 1 capsule (400 mg total) by mouth 2 (two) times daily. 10/25/14   [name] L [name], DO  hydrochlorothiazide (HYDRODIURIL) 25 MG tablet Take 1 tablet by mouth in  the morning 07/20/14   [name] L [name], DO  LORazepam (ATIVAN) 1 MG tablet Take 1 tablet (1 mg total) by mouth every 8 (eight) hours as needed for anxiety. 10/25/14   Lauree Chandler, NP  meclizine (ANTIVERT) 25 MG tablet Take one tablet by mouth once daily as needed for dizziness. Patient taking differently: Take 25 mg by mouth 2 (two) times daily as needed for dizziness.  05/26/14   [name] L [name], DO  mirtazapine (REMERON) 15 MG tablet Take one tablet by mouth every night at bedtime 06/10/14   [name] L [name], DO  nystatin (MYCOSTATIN/NYSTOP) 100000 UNIT/GM POWD Apply beneath left breast for yeast rash Patient taking differently:  Apply 1 Bottle topically daily as needed. Apply beneath left breast for yeast rash 01/11/14   [name] L [name], DO  oxyCODONE (OXY IR/ROXICODONE) 5 MG immediate release tablet Take 1 tablet (5 mg total) by mouth every 6 (six) hours as needed for moderate pain or severe pain. 09/20/14   Wylene Simmer, MD  Phenylephrine HCl (AFRIN ALLERGY NA) Place 2 sprays into the  nose daily as needed (Nasal congestion).    Historical Provider, MD  pyridOXINE (VITAMIN B-6) 50 MG tablet Take 50 mg by mouth daily.    Historical Provider, MD  senna (SENOKOT) 8.6 MG TABS tablet Take 2 tablets (17.2 mg total) by mouth 2 (two) times daily. 09/16/14   Wylene Simmer, MD  SUMAtriptan (IMITREX) 50 MG tablet See AUX Label Patient taking differently: TAKE ONE TABLET AS NEEDED FOR MIGRAINES 07/20/14   [name] L [name], DO  temazepam (RESTORIL) 7.5 MG capsule Take 1 capsule (7.5 mg total) by mouth at bedtime as needed for sleep. 10/25/14   Lauree Chandler, NP   Triage Vitals: BP 138/72 mmHg  Pulse 102  Temp(Src) 98.3 F (36.8 C) (Oral)  Resp 20  Ht 5\' 5"  (1.651 m)  Wt 230 lb (104.327 kg)  BMI 38.27 kg/m2  SpO2 99%  Physical Exam  Constitutional: [name] oriented to person, place, and time. She appears well-developed and well-nourished. No distress.  HENT:  Head: Normocephalic. Head is with laceration.  There is a 1.5cm, superficial laceration to the occiput. Bleeding is controlled.  Eyes: Conjunctivae and EOM are normal.  Neck: Neck supple. No tracheal deviation present.  Cardiovascular: Normal rate.   Pulmonary/Chest: Effort normal. No respiratory distress.  Musculoskeletal: Normal range of motion.  Neurological: [name] alert and oriented to person, place, and time.  Skin: Skin is warm and dry.  Psychiatric: She has a normal mood and affect. Her behavior is normal.  Nursing note and vitals reviewed.   ED Course  Procedures (including critical care time)  DIAGNOSTIC STUDIES: Oxygen Saturation is 99% on room air, normal  by my interpretation.    COORDINATION OF CARE: At 1956 Discussed treatment plan with patient which includes imaging. Patient agrees.   Labs Review Labs Reviewed - No data to display  Imaging Review No results found.   EKG Interpretation None      MDM   Final diagnoses:  None    Patient is a 74 year old female who presents after a fall. She fell backwards out of her wheelchair and hit her head. This caused a small laceration to her occiput that requires no repair. [name] neurologically intact and CT scan of the head and cervical spine are negative. She will be discharged with instructions to return as needed if symptoms significantly worsen or change.  I personally performed the services described in this documentation, which was scribed in my presence. The recorded information has been reviewed and is accurate.      Isabella Speak, MD 11/05/14 [id number]

## 2014-11-05 NOTE — ED Notes (Signed)
Per pt request her son was called for transport home.  Spoke with Donovan Kail at [phone].  He said that his brother, Eliezer Mccoy will likely be coming for pt and he will call [name].  [name]'s number is [phone].

## 2014-11-05 NOTE — ED Notes (Signed)
MD at bedside. 

## 2014-11-08 DIAGNOSIS — S82451D Displaced comminuted fracture of shaft of right fibula, subsequent encounter for closed fracture with routine healing: Secondary | ICD-10-CM | POA: Diagnosis not present

## 2014-11-09 ENCOUNTER — Other Ambulatory Visit: Payer: Self-pay | Admitting: Internal Medicine

## 2014-11-10 ENCOUNTER — Other Ambulatory Visit: Payer: Self-pay | Admitting: Neurology

## 2014-11-10 DIAGNOSIS — Z9181 History of falling: Secondary | ICD-10-CM | POA: Diagnosis not present

## 2014-11-10 DIAGNOSIS — E669 Obesity, unspecified: Secondary | ICD-10-CM | POA: Diagnosis not present

## 2014-11-10 DIAGNOSIS — E785 Hyperlipidemia, unspecified: Secondary | ICD-10-CM | POA: Diagnosis not present

## 2014-11-10 DIAGNOSIS — Z8673 Personal history of transient ischemic attack (TIA), and cerebral infarction without residual deficits: Secondary | ICD-10-CM | POA: Diagnosis not present

## 2014-11-10 DIAGNOSIS — G629 Polyneuropathy, unspecified: Secondary | ICD-10-CM | POA: Diagnosis not present

## 2014-11-10 DIAGNOSIS — S8262XD Displaced fracture of lateral malleolus of left fibula, subsequent encounter for closed fracture with routine healing: Secondary | ICD-10-CM | POA: Diagnosis not present

## 2014-11-10 DIAGNOSIS — G2581 Restless legs syndrome: Secondary | ICD-10-CM | POA: Diagnosis not present

## 2014-11-10 DIAGNOSIS — M1990 Unspecified osteoarthritis, unspecified site: Secondary | ICD-10-CM | POA: Diagnosis not present

## 2014-11-10 DIAGNOSIS — M5136 Other intervertebral disc degeneration, lumbar region: Secondary | ICD-10-CM | POA: Diagnosis not present

## 2014-11-10 DIAGNOSIS — Z87440 Personal history of urinary (tract) infections: Secondary | ICD-10-CM | POA: Diagnosis not present

## 2014-11-10 DIAGNOSIS — K219 Gastro-esophageal reflux disease without esophagitis: Secondary | ICD-10-CM | POA: Diagnosis not present

## 2014-11-10 DIAGNOSIS — I10 Essential (primary) hypertension: Secondary | ICD-10-CM | POA: Diagnosis not present

## 2014-11-10 DIAGNOSIS — F329 Major depressive disorder, single episode, unspecified: Secondary | ICD-10-CM | POA: Diagnosis not present

## 2014-11-10 MED ORDER — HYDROCODONE-ACETAMINOPHEN 5-325 MG PO TABS: 1.0000 | ORAL_TABLET | Freq: Four times a day (QID) | ORAL | Status: DC | PRN

## 2014-11-10 NOTE — Telephone Encounter (Signed)
Error

## 2014-11-10 NOTE — Telephone Encounter (Signed)
Request has been forwarded to provider for review.

## 2014-11-10 NOTE — Telephone Encounter (Signed)
Patient called and requested refill on RX. HYDROCODONE. Please call and advise. [phone]).

## 2014-11-11 ENCOUNTER — Telehealth: Payer: Self-pay

## 2014-11-11 ENCOUNTER — Encounter: Payer: Self-pay | Admitting: Internal Medicine

## 2014-11-11 ENCOUNTER — Ambulatory Visit (INDEPENDENT_AMBULATORY_CARE_PROVIDER_SITE_OTHER): Payer: Medicare Other | Admitting: Internal Medicine

## 2014-11-11 VITALS — BP 132/88 | HR 103 | Temp 98.1°F | Resp 20

## 2014-11-11 DIAGNOSIS — G2581 Restless legs syndrome: Secondary | ICD-10-CM | POA: Diagnosis not present

## 2014-11-11 DIAGNOSIS — F329 Major depressive disorder, single episode, unspecified: Secondary | ICD-10-CM

## 2014-11-11 DIAGNOSIS — I499 Cardiac arrhythmia, unspecified: Secondary | ICD-10-CM

## 2014-11-11 DIAGNOSIS — E669 Obesity, unspecified: Secondary | ICD-10-CM | POA: Diagnosis not present

## 2014-11-11 DIAGNOSIS — M5136 Other intervertebral disc degeneration, lumbar region: Secondary | ICD-10-CM | POA: Diagnosis not present

## 2014-11-11 DIAGNOSIS — G629 Polyneuropathy, unspecified: Secondary | ICD-10-CM

## 2014-11-11 DIAGNOSIS — M1990 Unspecified osteoarthritis, unspecified site: Secondary | ICD-10-CM | POA: Diagnosis not present

## 2014-11-11 DIAGNOSIS — Z8673 Personal history of transient ischemic attack (TIA), and cerebral infarction without residual deficits: Secondary | ICD-10-CM | POA: Diagnosis not present

## 2014-11-11 DIAGNOSIS — Z87440 Personal history of urinary (tract) infections: Secondary | ICD-10-CM | POA: Diagnosis not present

## 2014-11-11 DIAGNOSIS — E785 Hyperlipidemia, unspecified: Secondary | ICD-10-CM

## 2014-11-11 DIAGNOSIS — I10 Essential (primary) hypertension: Secondary | ICD-10-CM | POA: Diagnosis not present

## 2014-11-11 DIAGNOSIS — K219 Gastro-esophageal reflux disease without esophagitis: Secondary | ICD-10-CM | POA: Diagnosis not present

## 2014-11-11 DIAGNOSIS — F32A Depression, unspecified: Secondary | ICD-10-CM

## 2014-11-11 DIAGNOSIS — Z9181 History of falling: Secondary | ICD-10-CM | POA: Diagnosis not present

## 2014-11-11 DIAGNOSIS — S8262XD Displaced fracture of lateral malleolus of left fibula, subsequent encounter for closed fracture with routine healing: Secondary | ICD-10-CM | POA: Diagnosis not present

## 2014-11-11 DIAGNOSIS — S82891S Other fracture of right lower leg, sequela: Secondary | ICD-10-CM | POA: Diagnosis not present

## 2014-11-11 MED ORDER — GABAPENTIN 800 MG PO TABS: 800.0000 mg | ORAL_TABLET | Freq: Four times a day (QID) | ORAL | Status: DC

## 2014-11-11 NOTE — Telephone Encounter (Signed)
Rx ready for pick up. 

## 2014-11-11 NOTE — Progress Notes (Signed)
Patient ID: [name], female   DOB: 1940/10/11, 74 y.o.   MRN: [id number]   Location:  [facility]  Code Status: full code Goals of Care: Advanced Directive information Does patient have an advance directive?: Yes, Type of Advance Directive: Living will;Healthcare Power of Attorney, Does patient want to make changes to advanced directive?: No - Patient declined   Chief Complaint  Patient presents with  . Medical Management of Chronic Issues    HPI: Patient is a 74 y.o. white female seen in the office today for med mgt of chronic diseases.  Reviewed all that's happened since I saw her last in early March 2016.    Had been on brintellix for her mood, but didn't help.  She was seen in the ED for her depression on 08/07/14.  Was then sent to [facility].  Is now on cymbalta for mood and pain (I thought she was on this before and she had a side effect).  Was also started on restoril and lorazepam there.  When she saw Dr. Eulas Post, she c/o unsteady gait and using a walker.  [name] less dizzy now off norvasc. She had fallen three times.  Dr. Eulas Post reduced her lorazepam and made her temazepam prn.  Has been unable to attend grief support group due to the right ankle fracture she sustained on 08/21/14.    Was initially put in a boot in the ED.  Appt was made with Dr. Delilah Shan in the future.  She continued to fall--boot, walker and pt "did not agree" she says.  Dr. Delilah Shan did the xray and recommended keeping the boot on.  When she went back, it was not healing and separating.  Dr. Doran Durand looked at her, and she had surgery 09/14/14--right ORIF of ankle and syndesmosis, has plate and several screws on lateral side.  Had cast which eventually got transitioned to the Right cam boot.  Sees Dr. Doran Durand 11/26/14.    During her stay, she had an episode of hypoxemia.  Ct ruled out PE.  Apparently she was delirious.    Was in [place] 20 days for rehab, and now has home  health PT.  She was put on increased neurontin to 800mg  po qid.  She also takes sinemet for her RLS.  Currently her left hip pain is gone.  Still having neuropathic pain in her right ankle/foot.  Pain medicine for the ankle helped with it, but now she's off of all of those and she feels it again.  Is to see Jinny Blossom, Dr. Jannifer Franklin' NP.    [place] again 11/05/14 getting out of her wheelchair and hit the metal parts on the side with her left side of her head.  1.5cm laceration.  Her family rearranged her house so she can get around.    Review of Systems:  Review of Systems  Constitutional: Positive for malaise/fatigue. Negative for fever and chills.  HENT: Negative for congestion.   Eyes: Negative for blurred vision.  Respiratory: Negative for cough and shortness of breath.   Cardiovascular: Negative for chest pain and leg swelling.  Gastrointestinal: Negative for abdominal pain, constipation, blood in stool and melena.  Genitourinary: Negative for dysuria, urgency and frequency.  Musculoskeletal: Positive for myalgias, joint pain and falls.  Skin: Negative for rash.  Neurological: Negative for dizziness, loss of consciousness and weakness.  Endo/Heme/Allergies: Bruises/bleeds easily.  Psychiatric/Behavioral: Positive for depression. The patient is nervous/anxious.     Past Medical History  Diagnosis Date  .  Hypertension   . GERD (gastroesophageal reflux disease)     hx pud '98  . Arthritis     djd  . Restless leg syndrome   . 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 Dysrhythmia     hx tachy palpitations  . Headache(784.0)     remote hx of migraines  . Neuromuscular disorder     peripheral neuropathy, FEET AND LEGS  . Depression     bh admission '09  . Stroke     ? 6 months ago - tests okay - [facility]   . Cellulitis     10/11/11 hospitalized for cellulitis   . Hyperlipemia   . Peripheral neuropathy   . Elbow fracture, left April 2015    Past Surgical History  Procedure  Laterality Date  . Cervical disc surgery x2  2011  . Rt carotid enarterectomy  2011  . Rt knee arthroscopy  '99  . Left knee arthroscopy  2001  . Hematoma evacuation  2011    s/p rt cea  . Joint replacement  '01    total knee replacement, LEFT  . Abdominal hysterectomy  1975    bil oophorectomy  . Back surgery   MANY YRS AGO    laminectomy x3  . Cholecystectomy  1993  . Total knee arthroplasty  09/13/2011    Procedure: TOTAL KNEE ARTHROPLASTY;  Surgeon: Magnus Sinning, MD;  Location: [facility];  Service: Orthopedics;  Laterality: Right;  . Right knee replacement      09/2011   . Carpal tunnel release  07/09/2012    Procedure: CARPAL TUNNEL RELEASE;  Surgeon: Magnus Sinning, MD;  Location: [facility];  Service: Orthopedics;  Laterality: Left;  . Finger arthroplasty  07/09/2012    Procedure: FINGER ARTHROPLASTY;  Surgeon: Magnus Sinning, MD;  Location: [facility];  Service: Orthopedics;  Laterality: Left;  Interposition Arthroplasty [facility] Joint Thumb Left   . Lipoma excision  07/09/2012    Procedure: EXCISION LIPOMA;  Surgeon: Magnus Sinning, MD;  Location: [facility];  Service: Orthopedics;  Laterality: Left;  Excision Lipoma Dorsum Left Wrist   . Orif ankle fracture Right 09/14/2014    FIBULA   . Orif ankle fracture Right 09/14/2014    Procedure: OPEN REDUCTION INTERNAL FIXATION (ORIF) RIGHT ANKLE FRACTURE/SYNDESMOSIS ;  Surgeon: Wylene Simmer, MD;  Location: [place];  Service: Orthopedics;  Laterality: Right;    Allergies  Allergen Reactions  . Sulfa Drugs Cross Reactors Nausea And Vomiting   Medications: Patient's Medications  New Prescriptions   No medications on file  Previous Medications   ACETAMINOPHEN (TYLENOL) 325 MG TABLET    Take 2 tablets (650 mg total) by mouth every 6 (six) hours as needed for mild pain (or Fever >/= 101).   AMOXICILLIN (AMOXIL) 500 MG CAPSULE    Take 2,000 mg by mouth See admin instructions. Must take 4 capsules 1 hour before dental procedures   ASPIRIN 325 MG  TABLET    Take 325 mg by mouth daily.   ATORVASTATIN (LIPITOR) 20 MG TABLET    Take 1 tablet by mouth at  bedtime   CALCIUM CARBONATE 1250 MG CAPSULE    Take 1,250 mg by mouth daily.    CARBIDOPA-LEVODOPA (SINEMET CR) 50-200 MG PER TABLET    Take 1 tablet by mouth at bedtime.   CARBIDOPA-LEVODOPA (SINEMET IR) 25-250 MG PER TABLET    Take one tablet by mouth three times daily with  meals   CHOLECALCIFEROL (VITAMIN D) 1000 UNITS TABLET    Take 1,000 Units by mouth daily.   DULOXETINE (CYMBALTA) 60 MG CAPSULE    Take one capsule by mouth twice daily for depression   FLUTICASONE (FLONASE) 50 MCG/ACT NASAL SPRAY    Place 1 spray into both nostrils daily as needed for allergies or rhinitis.   GABAPENTIN (NEURONTIN) 400 MG CAPSULE    Take 1 capsule (400 mg total) by mouth 2 (two) times daily.   HYDROCHLOROTHIAZIDE (HYDRODIURIL) 25 MG TABLET    Take 1 tablet by mouth in  the morning   LORAZEPAM (ATIVAN) 1 MG TABLET    Take 1 tablet (1 mg total) by mouth every 8 (eight) hours as needed for anxiety.   MECLIZINE (ANTIVERT) 25 MG TABLET    Take one tablet by mouth once daily as needed for dizziness.   NYSTATIN (MYCOSTATIN/NYSTOP) 100000 UNIT/GM POWD    Apply beneath left breast for yeast rash   PHENYLEPHRINE HCL (AFRIN ALLERGY NA)    Place 2 sprays into the nose daily as needed (Nasal congestion).   PYRIDOXINE (VITAMIN B-6) 50 MG TABLET    Take 50 mg by mouth daily.   SUMATRIPTAN (IMITREX) 50 MG TABLET    See AUX Label   TEMAZEPAM (RESTORIL) 7.5 MG CAPSULE    Take 1 capsule (7.5 mg total) by mouth at bedtime as needed for sleep.  Modified Medications   No medications on file  Discontinued Medications   DOCUSATE SODIUM (COLACE) 100 MG CAPSULE    Take 1 capsule (100 mg total) by mouth 2 (two) times daily.   HYDROCODONE-ACETAMINOPHEN (NORCO/VICODIN) 5-325 MG PER TABLET    Take 1 tablet by mouth every 6 (six) hours as needed (Must last 28 days).   MIRTAZAPINE (REMERON) 15 MG TABLET    Take one tablet by mouth  every night at bedtime   OXYCODONE (OXY IR/ROXICODONE) 5 MG IMMEDIATE RELEASE TABLET    Take 1 tablet (5 mg total) by mouth every 6 (six) hours as needed for moderate pain or severe pain.   SENNA (SENOKOT) 8.6 MG TABS TABLET    Take 2 tablets (17.2 mg total) by mouth 2 (two) times daily.    Physical Exam: Filed Vitals:   11/11/14 1024  BP: 132/88  Pulse: 103  Temp: 98.1 F (36.7 C)  TempSrc: Oral  Resp: 20  SpO2: 97%   Physical Exam  Constitutional: [name] oriented to person, place, and time. She appears well-developed and well-nourished. No distress.  Obese white female seated in wheelchair  Cardiovascular: Intact distal pulses.   Tachy, sounded irregular but pulse felt regular   Pulmonary/Chest: Effort normal and breath sounds normal. No respiratory distress.  Abdominal: Soft. Bowel sounds are normal. She exhibits no distension. There is no tenderness.  Musculoskeletal:  Right cam boot   Neurological: [name] alert and oriented to person, place, and time.  Skin: Skin is warm and dry.  Psychiatric: She has a normal mood and affect.  Seems much happier and in better spirits despite her current functional state    Labs reviewed: Basic Metabolic Panel:  Recent Labs  09/17/14 0500 09/19/14 0619 09/20/14 0810  NA 136 137 138  K 3.1* 2.7* 4.6  CL 92* 92* 97  CO2 31 33* 27  GLUCOSE 166* 150* 140*  BUN 14 10 13   CREATININE 1.17* 0.77 0.87  CALCIUM 9.3 9.6 9.8  MG  --  1.7  --    Liver Function Tests:  Recent Labs  01/11/14 1514 08/07/14 0614  AST 10 22  ALT <5 <5  ALKPHOS 117 80  BILITOT 0.2 1.0  PROT 7.0 8.2  ALBUMIN  --  4.4   No results for input(s): LIPASE, AMYLASE in the last 8760 hours. No results for input(s): AMMONIA in the last 8760 hours. CBC:  Recent Labs  01/11/14 1514 08/07/14 0614 09/14/14 1234 09/17/14 0500  WBC 12.4* 10.1 11.3* 10.7*  NEUTROABS 8.8* 6.6  --   --   HGB 12.7 14.2 11.9* 10.4*  HCT 38.2 43.5 37.7 33.5*  MCV 93 94.6 95.0  97.4  PLT  --  356 348 266   Lipid Panel:  Recent Labs  01/11/14 1514  CHOL 199  HDL 40  LDLCALC Comment  TRIG 462*  CHOLHDL 5.0*   Lab Results  Component Value Date   HGBA1C 5.3 10/12/2011    Procedures since last visit: Reviewed hospital and SNF records since 3/16.    Assessment/Plan 1. Depression -improved on cymbalta (thought I had her on it before and she had a side effect, but is tolerating well now so continue)  2. Closed right ankle fracture, sequela -f/u with Dr. Doran Durand as planned, cont cam boot  3. Essential hypertension -bp at goal with current regimen, no changes made - Basic metabolic panel - CBC with Differential/Platelet - EKG 12-Lead  4. Restless leg syndrome -is on sinemet through neurology, Dr. Jannifer Franklin, for this  5. Neuropathy -is taking high doses of neurontin resumed at [facility] place at her request, reordered today--originated from neurology and says her right leg nerve pain was worse lately  6. Hyperlipidemia - f/u labs this am b/c she got up late and didn't eat breakfast (I've been trying to get her to come in fasting for years) - Hemoglobin A1c - Lipid panel - EKG 12-Lead  7. Degenerative disc disease, lumbar -stable, had no complaints about this today  8. 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 Basic metabolic panel    Order Specific Question:  Has the patient fasted?    Answer:  Yes  . CBC with Differential/Platelet  . Hemoglobin A1c  . Lipid panel    Order Specific Question:  Has the patient fasted?    Answer:  Yes  . EKG 12-Lead    Order Specific Question:  Where should this test be performed    Answer:  OTHER    Next appt:  4 mos for med mgt  Rainen Vanrossum L. Trajon Rosete, D.O. [facility] [address] [place], [place] [address] Cell Phone (Mon-Fri 8am-5pm):  [phone] On Call:  [phone] & follow prompts after 5pm & weekends Office Phone:  [phone] Office Fax:  [phone]

## 2014-11-11 NOTE — Patient Instructions (Signed)
Please bring a copy of your living will/health care power of attorney.

## 2014-11-12 LAB — LIPID PANEL
Chol/HDL Ratio: 5.7 ratio units — ABNORMAL HIGH (ref 0.0–4.4)
Cholesterol, Total: 222 mg/dL — ABNORMAL HIGH (ref 100–199)
HDL: 39 mg/dL — ABNORMAL LOW (ref >39)
LDL Calculated: 110 mg/dL — ABNORMAL HIGH (ref 0–99)
Triglycerides: 364 mg/dL — ABNORMAL HIGH (ref 0–149)
VLDL Cholesterol Cal: 73 mg/dL — ABNORMAL HIGH (ref 5–40)

## 2014-11-12 LAB — BASIC METABOLIC PANEL
BUN/Creatinine Ratio: 8 — ABNORMAL LOW (ref 11–26)
BUN: 7 mg/dL — ABNORMAL LOW (ref 8–27)
CO2: 28 mmol/L (ref 18–29)
Calcium: 9.5 mg/dL (ref 8.7–10.3)
Chloride: 96 mmol/L — ABNORMAL LOW (ref 97–108)
Creatinine, Ser: 0.92 mg/dL (ref 0.57–1.00)
GFR calc Af Amer: 72 mL/min/{1.73_m2} (ref >59)
GFR calc non Af Amer: 62 mL/min/{1.73_m2} (ref >59)
Glucose: 160 mg/dL — ABNORMAL HIGH (ref 65–99)
Potassium: 3.3 mmol/L — ABNORMAL LOW (ref 3.5–5.2)
Sodium: 143 mmol/L (ref 134–144)

## 2014-11-12 LAB — CBC WITH DIFFERENTIAL/PLATELET
Basophils Absolute: 0 10*3/uL (ref 0.0–0.2)
Basos: 1 %
EOS (ABSOLUTE): 0.2 10*3/uL (ref 0.0–0.4)
Eos: 3 %
Hematocrit: 39.8 % (ref 34.0–46.6)
Hemoglobin: 12.7 g/dL (ref 11.1–15.9)
Immature Grans (Abs): 0 10*3/uL (ref 0.0–0.1)
Immature Granulocytes: 0 %
Lymphocytes Absolute: 3 10*3/uL (ref 0.7–3.1)
Lymphs: 40 %
MCH: 29.6 pg (ref 26.6–33.0)
MCHC: 31.9 g/dL (ref 31.5–35.7)
MCV: 93 fL (ref 79–97)
Monocytes Absolute: 0.5 10*3/uL (ref 0.1–0.9)
Monocytes: 7 %
Neutrophils Absolute: 3.7 10*3/uL (ref 1.4–7.0)
Neutrophils: 49 %
Platelets: 301 10*3/uL (ref 150–379)
RBC: 4.29 x10E6/uL (ref 3.77–5.28)
RDW: 14.2 % (ref 12.3–15.4)
WBC: 7.5 10*3/uL (ref 3.4–10.8)

## 2014-11-12 LAB — HEMOGLOBIN A1C
Est. average glucose Bld gHb Est-mCnc: 157 mg/dL
Hgb A1c MFr Bld: 7.1 % — ABNORMAL HIGH (ref 4.8–5.6)

## 2014-11-16 ENCOUNTER — Telehealth: Payer: Self-pay

## 2014-11-16 MED ORDER — POTASSIUM CHLORIDE CRYS ER 20 MEQ PO TBCR: EXTENDED_RELEASE_TABLET | ORAL | Status: DC

## 2014-11-16 MED ORDER — ATORVASTATIN CALCIUM 40 MG PO TABS: ORAL_TABLET | ORAL | Status: DC

## 2014-11-16 NOTE — Telephone Encounter (Signed)
Spoke with patient about lab results, fax Rx to [facility]

## 2014-11-18 ENCOUNTER — Telehealth: Payer: Self-pay | Admitting: *Deleted

## 2014-11-18 NOTE — Telephone Encounter (Signed)
Tried calling patient, phone rings busy. Will try again later.

## 2014-11-18 NOTE — Telephone Encounter (Signed)
Patient called and stated that she has a broken leg and cannot come in. Patient wants an antibiotic called in due to running a fever 102 and spitting up Green/yellow congestion and hoarse. Please Advise.

## 2014-11-18 NOTE — Telephone Encounter (Signed)
Is she getting home health?  Ideally she should have someone look at her.  We can treat her with doxycycline 100mg  po bid for 10 days and florastor 250mg  po bid for 14 days for bronchitis.  I'd like her to have home health nursing assess her to make sure she is getting better over the next week.

## 2014-11-19 MED ORDER — DOXYCYCLINE HYCLATE 100 MG PO TABS: ORAL_TABLET | ORAL | Status: DC

## 2014-11-19 MED ORDER — SACCHAROMYCES BOULARDII 250 MG PO CAPS: 250.0000 mg | ORAL_CAPSULE | Freq: Two times a day (BID) | ORAL | Status: DC

## 2014-11-19 NOTE — Telephone Encounter (Signed)
Phone number just rings and rings-no answer. Will try again.

## 2014-11-19 NOTE — Telephone Encounter (Signed)
Patient notified. Patient does get Home Health Services. Faxed Rx to pharmacy and patient agreed to instructions.

## 2014-11-23 ENCOUNTER — Telehealth: Payer: Self-pay | Admitting: *Deleted

## 2014-11-23 ENCOUNTER — Other Ambulatory Visit: Payer: Self-pay | Admitting: *Deleted

## 2014-11-23 ENCOUNTER — Telehealth: Payer: Self-pay | Admitting: Neurology

## 2014-11-23 MED ORDER — TRAMADOL HCL 50 MG PO TABS: 50.0000 mg | ORAL_TABLET | Freq: Four times a day (QID) | ORAL | Status: DC | PRN

## 2014-11-23 NOTE — Telephone Encounter (Signed)
Patient called back to request update on medication. Please call and advise.

## 2014-11-23 NOTE — Telephone Encounter (Signed)
I spoke to the patient. She stated that she had surgery on her ankle and ever since then her nerve pain has been worse. She stated that she thinks the surgery aggravated her nerve pain. She has been taking 2 Norcos at a time to get relief and is now running out. Her Rx was last filled on 6/9. She requests that Dr. Jannifer Franklin write the Rx early. I asked if she had any pain medication from the surgery and she stated she did but she is out of that now, so she started taking 2 Norcos. I explained that Dr. Jannifer Franklin' Rx is for only 1 tablet at a time. She verbalized understanding but stated she is in too much pain when she takes only 1. [name] crying in pain. She would like to speak to Dr. Jannifer Franklin.

## 2014-11-23 NOTE — Telephone Encounter (Signed)
Patient called back and wanted to request an update on the medication.

## 2014-11-23 NOTE — Telephone Encounter (Signed)
I called the patient back.  Advised a message has been forward provider for review.  She verbalized understanding.

## 2014-11-23 NOTE — Telephone Encounter (Signed)
I called patient. The patient is having increased pain, she ran out of the hydrocodone. The patient was suffering from a gastrointestinal viral infection with nausea and diarrhea. She does not leave that she was absorbing the pain medication well. I will call in a one-time prescription for Ultram. Get her through until she can get her scheduled refill for hydrocodone on 12/02/2014. The patient has a revisit on that day.

## 2014-11-23 NOTE — Telephone Encounter (Signed)
Pat with [facility]. Pharmacy called wanting Clarification with patient new Neurontin medication change. Medication was increased from 400mg  to 800mg  on 6/9. Pharmacist informed and will ship medication to patient.

## 2014-11-23 NOTE — Telephone Encounter (Signed)
Patient called due to having headaches due to her sinuses, no fever just head congestion. Wants something called in for it. Cannot drive due to ankle surgery, so doesn't want to schedule an appointment.  Please Advise.

## 2014-11-23 NOTE — Telephone Encounter (Signed)
Patient is requesting a refill on Hydrocodone 5/325mg  one every 6 hours as needed, must last 28 days #90.  This Rx was last written on 06/08.  I called back.  Patient says she fell getting out of the bath tub and broke her ankle in April, had to get a plate and screws due to this.  Her orthopedist was prescribing Tramadol, which did not help.  Says she feels like her nerve pain has increased so she has been taking two tablets of Hydrocodone per dose instead of one.  I explained it is not recommended to take more medication than prescribed without consulting/instruction from provider.  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  She last had Hydrocodone filled on 06/09, and says she has 2 tablets remaining.  Requesting another refill at this time.  Please advise.  Thank you.

## 2014-11-23 NOTE — Telephone Encounter (Signed)
Patient called and requested a refill on Rx. HYDROCODONE. Please call and advise.

## 2014-11-23 NOTE — Telephone Encounter (Signed)
Patient has called back twice and requested to speak with Jessicas, Dr. Jannifer Franklin, and Jinny Blossom NP regarding this medication. She states that she is in extreme pain and would like to speak with someone soon. Please call and advise.

## 2014-11-23 NOTE — Telephone Encounter (Signed)
flonase nasal spray one spray in each nostril daily for congestion.  Dispense 1 with just 1 refill.  If this persists, she will need an appt.

## 2014-11-24 ENCOUNTER — Other Ambulatory Visit: Payer: Self-pay | Admitting: Internal Medicine

## 2014-11-24 MED ORDER — FLUTICASONE PROPIONATE 50 MCG/ACT NA SUSP: 1.0000 | Freq: Every day | NASAL | Status: DC | PRN

## 2014-11-24 NOTE — Telephone Encounter (Signed)
Patient Notified and agreed with instructions.

## 2014-11-25 ENCOUNTER — Other Ambulatory Visit: Payer: Self-pay | Admitting: *Deleted

## 2014-11-25 MED ORDER — POTASSIUM CHLORIDE CRYS ER 20 MEQ PO TBCR: EXTENDED_RELEASE_TABLET | ORAL | Status: DC

## 2014-11-25 NOTE — Telephone Encounter (Signed)
[facility] 

## 2014-11-26 ENCOUNTER — Telehealth: Payer: Self-pay | Admitting: *Deleted

## 2014-11-26 DIAGNOSIS — S82451D Displaced comminuted fracture of shaft of right fibula, subsequent encounter for closed fracture with routine healing: Secondary | ICD-10-CM | POA: Diagnosis not present

## 2014-11-26 DIAGNOSIS — Z4789 Encounter for other orthopedic aftercare: Secondary | ICD-10-CM | POA: Diagnosis not present

## 2014-11-26 NOTE — Telephone Encounter (Signed)
Patient called and stated that her stomach has been bothering her. Stated that you had given her a probiotic when she was given an antibiotic, but she cant seem to get her stomach straighten out. She has periods of diarrhea and nausea and all she ate was crackers. She has tried the [employer] also with no relief. Patient states that she just feels so weak. Please Advise.

## 2014-11-27 NOTE — Telephone Encounter (Signed)
She needs to be evaluated.  She could have C diff or unresolving diarrhea from the antibiotics.

## 2014-11-29 NOTE — Telephone Encounter (Signed)
Scheduled an appointment for Friday 12/03/2014.

## 2014-12-02 ENCOUNTER — Ambulatory Visit (INDEPENDENT_AMBULATORY_CARE_PROVIDER_SITE_OTHER): Payer: Medicare Other | Admitting: Adult Health

## 2014-12-02 ENCOUNTER — Encounter: Payer: Self-pay | Admitting: Adult Health

## 2014-12-02 VITALS — BP 130/77 | HR 68 | Ht 64.0 in | Wt 220.0 lb

## 2014-12-02 DIAGNOSIS — G2581 Restless legs syndrome: Secondary | ICD-10-CM | POA: Diagnosis not present

## 2014-12-02 DIAGNOSIS — G63 Polyneuropathy in diseases classified elsewhere: Secondary | ICD-10-CM | POA: Diagnosis not present

## 2014-12-02 NOTE — Progress Notes (Signed)
PATIENT: [name] DOB: [birth year]  REASON FOR VISIT: follow up- peripheral neuropathy and restless leg syndrome HISTORY FROM: patient  HISTORY OF PRESENT ILLNESS: Ms. Creps is a 74 year old female with a history of peripheral neuropathy and restless leg syndrome. She returns today for follow-up. The patient states that she fell in April getting out of her bathtub and broke her right foot. She has surgery April 13. She states that since the surgery she has been having increased pain in her right foot. She continues to take gabapentin and states that works well. She also has a TENS unit and states she gets good benefit from that. Patient continues to use Sinemet for restless leg syndrome. She states this controls her symptoms well. Patient also takes hydrocodone for pain. She had a refill at the beginning of June the called in last week warning a refill. She states that she had to take more of the medication due to her foot pain. Her Willis caught in her a one-time prescription of Ultram. The patient states that she still has pills left over from this. She returns today for an evaluation.  HISTORY 05/20/14: Mrs. Harpham is a 74 year old female with a history of peripheral neuropathy and restless legs. She returns today for follow-up. [name] currently taking Vicodin for pain as well as gabapentin. She also uses Sinemet for RLS. She report this is working well. Patient reports [facility] and states that she was not impressed. She does have a follow up appointment 1/7. She recently bought a TENS unit and using on the right foot. She states this is working well for her. She reports that she has numbness and tingling in both feet. Patient states that her balance has gotten a little worse. She uses a cane more often. Denies any falls.   HISTORY 04/08/13 (CM): 74 year old right-handed white female with a history of a peripheral neuropathy, obesity, and degenerative  arthritis. 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 [name] still having pain in the knee. She also has back pain followed by Dr. Ellene Route. [name] not using any assistive device. The patient has been taking Vicodin for pain. The patient has restless leg syndrome, and takes Sinemet for this with good benefit. The patient indicates that she is able to rest at night fairly well.She also takes neurontin for her neuropathy. Symptoms are fairly well controlled. She has been having problems with left hand,recent surgery on left thumb and CT release on the left  REVIEW OF SYSTEMS: Out of a complete 14 system review of symptoms, the patient complains only of the following symptoms, and all other reviewed systems are negative.  Environmental allergies, dizziness, numbness  ALLERGIES: Allergies  Allergen Reactions  . Sulfa Drugs Cross Reactors Nausea And Vomiting    HOME MEDICATIONS: Outpatient Prescriptions Prior to Visit  Medication Sig Dispense Refill  . acetaminophen (TYLENOL) 325 MG tablet Take 2 tablets (650 mg total) by mouth every 6 (six) hours as needed for mild pain (or Fever >/= 101).    Marland Kitchen amoxicillin (AMOXIL) 500 MG capsule Take 2,000 mg by mouth See admin instructions. Must take 4 capsules 1 hour before dental procedures    . aspirin 325 MG tablet Take 325 mg by mouth daily.    Marland Kitchen atorvastatin (LIPITOR) 20 MG tablet Take 1 tablet by mouth at  bedtime 90 tablet 0  . atorvastatin (LIPITOR) 40 MG tablet Take one tablet daily for cholesterol 30 tablet  4  . calcium carbonate 1250 MG capsule Take 1,250 mg by mouth daily.     . carbidopa-levodopa (SINEMET CR) 50-200 MG per tablet Take 1 tablet by mouth at bedtime. 90 tablet 3  . carbidopa-levodopa (SINEMET IR) 25-250 MG per tablet Take one tablet by mouth three times daily with meals 270 tablet 3  . cholecalciferol (VITAMIN D) 1000 UNITS tablet Take 1,000 Units by mouth daily.    Marland Kitchen doxycycline (VIBRA-TABS) 100 MG tablet Take one tablet  by mouth twice daily for bronchitis 20 tablet 0  . DULoxetine (CYMBALTA) 60 MG capsule Take 1 capsule by mouth  twice a day for depression 60 capsule 0  . FLUoxetine (PROZAC) 20 MG tablet Take 1 tablet by mouth  daily as directed 7 tablet 0  . fluticasone (FLONASE) 50 MCG/ACT nasal spray SHAKE WELL AND USE 1 SPRAY IN EACH NOSTRIL DAILY AS NEEDED FOR ALLERGIES OR RHINITIS 48 g 1  . gabapentin (NEURONTIN) 800 MG tablet Take 1 tablet (800 mg total) by mouth 4 (four) times daily. 360 tablet 3  . hydrochlorothiazide (HYDRODIURIL) 25 MG tablet Take 1 tablet by mouth in  the morning 90 tablet 0  . LORazepam (ATIVAN) 1 MG tablet Take 1 tablet (1 mg total) by mouth every 8 (eight) hours as needed for anxiety. 90 tablet 1  . meclizine (ANTIVERT) 25 MG tablet Take one tablet by mouth once daily as needed for dizziness. 30 tablet 0  . nystatin (MYCOSTATIN/NYSTOP) 100000 UNIT/GM POWD Apply beneath left breast for yeast rash 15 g 1  . Phenylephrine HCl (AFRIN ALLERGY NA) Place 2 sprays into the nose daily as needed (Nasal congestion).    . potassium chloride SA (K-DUR,KLOR-CON) 20 MEQ tablet Take one tablet daily for potassium 90 tablet 1  . pyridOXINE (VITAMIN B-6) 50 MG tablet Take 50 mg by mouth daily.    Marland Kitchen saccharomyces boulardii (FLORASTOR) 250 MG capsule Take 1 capsule (250 mg total) by mouth 2 (two) times daily. 28 capsule 0  . SUMAtriptan (IMITREX) 50 MG tablet See AUX Label 30 tablet 2  . temazepam (RESTORIL) 7.5 MG capsule Take 1 capsule (7.5 mg total) by mouth at bedtime as needed for sleep. 30 capsule 1  . amLODipine (NORVASC) 10 MG tablet Take 1 tablet by mouth at  bedtime (Patient not taking: Reported on 12/02/2014) 90 tablet 0  . traMADol (ULTRAM) 50 MG tablet Take 1 tablet (50 mg total) by mouth every 6 (six) hours as needed. (Patient not taking: Reported on 12/02/2014) 60 tablet 0   No facility-administered medications prior to visit.    PAST MEDICAL HISTORY: Past Medical History  Diagnosis  Date  . Hypertension   . GERD (gastroesophageal reflux disease)     hx pud '98  . Arthritis     djd  . Restless leg syndrome   . 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 Dysrhythmia     hx tachy palpitations  . Headache(784.0)     remote hx of migraines  . Neuromuscular disorder     peripheral neuropathy, FEET AND LEGS  . Depression     [facility] admission '09  . Stroke     ? 6 months ago - tests okay - [facility]   . Cellulitis     10/11/11 hospitalized for cellulitis   . Hyperlipemia   . Peripheral neuropathy   . Elbow fracture, left April 2015    PAST SURGICAL HISTORY: Past Surgical History  Procedure Laterality Date  . Cervical disc surgery x2  2011  . Rt carotid enarterectomy  2011  . Rt knee arthroscopy  '99  . Left knee arthroscopy  2001  . Hematoma evacuation  2011    s/p rt cea  . Joint replacement  '01    total knee replacement, LEFT  . Abdominal hysterectomy  1975    bil oophorectomy  . Back surgery   MANY YRS AGO    laminectomy x3  . Cholecystectomy  1993  . Total knee arthroplasty  09/13/2011    Procedure: TOTAL KNEE ARTHROPLASTY;  Surgeon: Magnus Sinning, MD;  Location: [facility];  Service: Orthopedics;  Laterality: Right;  . Right knee replacement      09/2011   . Carpal tunnel release  07/09/2012    Procedure: CARPAL TUNNEL RELEASE;  Surgeon: Magnus Sinning, MD;  Location: [facility];  Service: Orthopedics;  Laterality: Left;  . Finger arthroplasty  07/09/2012    Procedure: FINGER ARTHROPLASTY;  Surgeon: Magnus Sinning, MD;  Location: [facility];  Service: Orthopedics;  Laterality: Left;  Interposition Arthroplasty [facility] Joint Thumb Left   . Lipoma excision  07/09/2012    Procedure: EXCISION LIPOMA;  Surgeon: Magnus Sinning, MD;  Location: [facility];  Service: Orthopedics;  Laterality: Left;  Excision Lipoma Dorsum Left Wrist   . Orif ankle fracture Right 09/14/2014    FIBULA   . Orif ankle fracture Right 09/14/2014    Procedure: OPEN REDUCTION INTERNAL FIXATION  (ORIF) RIGHT ANKLE FRACTURE/SYNDESMOSIS ;  Surgeon: Wylene Simmer, MD;  Location: [place];  Service: Orthopedics;  Laterality: Right;    FAMILY HISTORY: Family History  Problem Relation Age of Onset  . Congestive Heart Failure Father   . Congestive Heart Failure Sister   . Alzheimer's disease Mother   . Diabetes Brother   . Arthritis Brother     SOCIAL HISTORY: History   Social History  . Marital Status: Widowed    Spouse Name: N/A  . Number of Children: 2  . Years of Education: 14   Occupational History  . Not on file.   Social History Main Topics  . Smoking status: Never Smoker   . Smokeless tobacco: Never Used  . Alcohol Use: No  . Drug Use: No  . Sexual Activity: Not on file   Other Topics Concern  . Not on file   Social History Narrative   Patient is widowed and lives alone.   Patient has two sons.   Patient is retired.   Patient has a college education.   Patient is right-handed.   Patient drinks three glasses of Coke-soda daily.            PHYSICAL EXAM  Filed Vitals:   12/02/14 1459  Height: 5\' 4"  (1.626 m)  Weight: 220 lb (99.791 kg)   Body mass index is 37.74 kg/(m^2).  Generalized: Well developed, in no acute distress   Neurological examination  Mentation: Alert oriented to time, place, history taking. Follows all commands speech and language fluent Cranial nerve II-XII: Pupils were equal round reactive to light. Extraocular movements were full, visual field were full on confrontational test. Facial sensation and strength were normal. Uvula tongue midline. Head turning and shoulder shrug  were normal and symmetric. Motor: The motor testing reveals 5 over 5 strength of all 4 extremities. Good symmetric motor tone is noted throughout.  Sensory: Sensory testing is intact to soft touch on all 4 extremities. No evidence of extinction is  noted.  Coordination: Cerebellar testing reveals good finger-nose-finger and heel-to-shin bilaterally.  Gait and  station: Patient uses a walker when ambulating. Has a slight limp on the right due to foot. Reflexes: Deep tendon reflexes are symmetric and normal bilaterally.    DIAGNOSTIC DATA (LABS, IMAGING, TESTING) - I reviewed patient records, labs, notes, testing and imaging myself where available.  Lab Results  Component Value Date   WBC 7.5 11/11/2014   HGB 10.4* 09/17/2014   HCT 39.8 11/11/2014   MCV 97.4 09/17/2014   PLT 266 09/17/2014      Component Value Date/Time   NA 143 11/11/2014 1135   NA 138 09/20/2014 0810   K 3.3* 11/11/2014 1135   CL 96* 11/11/2014 1135   CO2 28 11/11/2014 1135   GLUCOSE 160* 11/11/2014 1135   GLUCOSE 140* 09/20/2014 0810   BUN 7* 11/11/2014 1135   BUN 13 09/20/2014 0810   CREATININE 0.92 11/11/2014 1135   CALCIUM 9.5 11/11/2014 1135   PROT 8.2 08/07/2014 0614   PROT 7.0 01/11/2014 1514   ALBUMIN 4.4 08/07/2014 0614   AST 22 08/07/2014 0614   ALT <5 08/07/2014 0614   ALKPHOS 80 08/07/2014 0614   BILITOT 1.0 08/07/2014 0614   GFRNONAA 62 11/11/2014 1135   GFRAA 72 11/11/2014 1135   Lab Results  Component Value Date   CHOL 222* 11/11/2014   HDL 39* 11/11/2014   LDLCALC 110* 11/11/2014   TRIG 364* 11/11/2014   CHOLHDL 5.7* 11/11/2014   Lab Results  Component Value Date   HGBA1C 7.1* 11/11/2014       ASSESSMENT AND PLAN 74 y.o. year old female  has a past medical history of Hypertension; GERD (gastroesophageal reflux disease); Arthritis; Restless leg syndrome; PPD positive, treated (1987); Dysrhythmia; Headache(784.0); Neuromuscular disorder; Depression; Stroke; Cellulitis; Hyperlipemia; Peripheral neuropathy; and Elbow fracture, left (April 2015). here with:  1. Peripheral neuropathy 2. Restless leg syndrome  Were some increase pain in the right foot after a fall in April. She will continue using gabapentin. She continues to use Sinemet for restless leg symptoms. These medications are working well for her. The patient is also on Vicodin  for pain. [name] not due for a refill until next week. The patient can continue to use Ultram until then. She verbalized understanding. If her symptoms worsen or she develops new symptoms she should let us know. Otherwise she will follow-up in 6 months or sooner if needed.   Ward Givens, MSN, NP-C 12/02/2014, 3:01 PM [facility] [address], [place], [place] [address] [phone]  Note: This document was prepared with digital dictation and possible smart phrase technology. Any transcriptional errors that result from this process are unintentional.

## 2014-12-02 NOTE — Progress Notes (Signed)
I have read the note, and I agree with the clinical assessment and plan.  Onedia Vargus KEITH   

## 2014-12-02 NOTE — Patient Instructions (Signed)
Continue Gabapentin Continue Sinemet I will Refill hydrocodone on Monday.

## 2014-12-03 ENCOUNTER — Telehealth: Payer: Self-pay | Admitting: Neurology

## 2014-12-03 ENCOUNTER — Ambulatory Visit (INDEPENDENT_AMBULATORY_CARE_PROVIDER_SITE_OTHER): Payer: Medicare Other | Admitting: Internal Medicine

## 2014-12-03 ENCOUNTER — Encounter: Payer: Self-pay | Admitting: Internal Medicine

## 2014-12-03 ENCOUNTER — Telehealth: Payer: Self-pay | Admitting: Internal Medicine

## 2014-12-03 VITALS — BP 150/82 | HR 120 | Temp 99.1°F | Resp 20 | Ht 65.0 in | Wt 220.8 lb

## 2014-12-03 DIAGNOSIS — E876 Hypokalemia: Secondary | ICD-10-CM | POA: Diagnosis not present

## 2014-12-03 DIAGNOSIS — E785 Hyperlipidemia, unspecified: Secondary | ICD-10-CM

## 2014-12-03 DIAGNOSIS — S82891S Other fracture of right lower leg, sequela: Secondary | ICD-10-CM | POA: Diagnosis not present

## 2014-12-03 DIAGNOSIS — R739 Hyperglycemia, unspecified: Secondary | ICD-10-CM | POA: Diagnosis not present

## 2014-12-03 DIAGNOSIS — G63 Polyneuropathy in diseases classified elsewhere: Secondary | ICD-10-CM | POA: Diagnosis not present

## 2014-12-03 DIAGNOSIS — F32A Depression, unspecified: Secondary | ICD-10-CM

## 2014-12-03 DIAGNOSIS — F329 Major depressive disorder, single episode, unspecified: Secondary | ICD-10-CM | POA: Diagnosis not present

## 2014-12-03 DIAGNOSIS — I10 Essential (primary) hypertension: Secondary | ICD-10-CM

## 2014-12-03 MED ORDER — TRAMADOL HCL 50 MG PO TABS: 50.0000 mg | ORAL_TABLET | Freq: Four times a day (QID) | ORAL | Status: DC | PRN

## 2014-12-03 NOTE — Telephone Encounter (Signed)
Please see phone note from today's date, prescription was already called in

## 2014-12-03 NOTE — Telephone Encounter (Signed)
I called the patient. The patient apparently has gone through 70 Ultram since June 21. She has had recent surgery, increased pain. I will call in 6 more Ultram tablets. The patient also has hydrocodone to take if needed.

## 2014-12-03 NOTE — Telephone Encounter (Signed)
I called the patient. The patient called back about the very same issue that she called about around 1:30 this afternoon. The patient has no recollection of this conversation, I indicated that I have already called in the prescription for 6 Ultram tablets. Again, she does not remember this conversation. It is possible this patient may be overmedicated.

## 2014-12-03 NOTE — Telephone Encounter (Signed)
It appears Tramadol was written per note on 06/21 for #60 tablets.  I called back.  Patient says she has taken 55 of the 60 tablets and would like to get a new Rx for a few additional tablets.  Please advise.  Thank you.

## 2014-12-03 NOTE — Telephone Encounter (Signed)
The patient called again. She indicates that the ultram Rx was not received by the pharmacy. I will call again.

## 2014-12-03 NOTE — Progress Notes (Signed)
Patient ID: [name], female   DOB: [birth year], 74 y.o.   MRN: [id number]   Location:  [facility]  Code Status: full code Goals of Care: Advanced Directive information Does patient have an advance directive?: Yes, Type of Advance Directive: Unity;Living will, Does patient want to make changes to advanced directive?: No - Patient declined   Chief Complaint  Patient presents with  . Medical Management of Chronic Issues    concentration off and breaking out in tears for some of the small things  . Acute Visit    diarrhea from antibiotic    HPI: Patient is a 74 y.o. white female seen in the office today for her routine visit and for a few acute concerns.    Bronchitis resolved with doxy and florastor for 6/16.   Head congestion and sinus issues 6/21 that improved with flonase  Initiallly had diarrhea after abx.  Used BRAT diet--hates plain rice.  Nausea and diarrhea resolved finally.  Goes back to orthopedics in one month Dr. Doran Durand.  Back of leg still painful at times up her calf.  Has swelling still in the ankle.  Is ambulatory with her walker now. No longer getting home health, but she says Ronalee Belts from therapy worked her out and she did her exercises when he was not there.    Saw neurology yesterday.  Continued gabapentin and sinemet.  For hydrocodone on Monday from neurology.  Reminds me that she had respiratory failure and delirium after she had dye for a CT chest to r/o PE.  She thinks she had a reaction to the contrast and it was not the pills she is on oversedating her.  Mood is not good again today--breaks down.  Says anyone looks at her she cries.  Doesn't know if it's b/c of her ankle and disability from it, or what.  Feels sad, then cries.  Says she hopes she gets out of the transition phase with the antidepressants soon.  Says these are the times you want your mama.  Prozac was stopped after a week taper and  she is now on 60mg  bid of cymbalta.  Says she was very depressed when she had her knee surgeries also.  Says she is ok as long as someone is around.  Has two sons.  They have been coming to check on her.  Denies thoughts of self-harm/SI/HI.  Sleeping at night.  Not sleeping all day.  Discussed following up with her psychologist, Dr. Geoffry Paradise.    Review of Systems:  Review of Systems  Constitutional: Positive for malaise/fatigue. Negative for fever and chills.  HENT: Positive for congestion. Negative for sore throat.   Eyes: Negative for blurred vision.  Respiratory: Negative for cough and shortness of breath.   Cardiovascular: Negative for chest pain.  Gastrointestinal: Negative for nausea, vomiting, abdominal pain, diarrhea, constipation, blood in stool and melena.  Genitourinary: Negative for dysuria.  Musculoskeletal: Positive for joint pain. Negative for falls.       Right ankle  Skin: Negative for rash.  Neurological: Positive for weakness. Negative for dizziness.  Psychiatric/Behavioral: Positive for depression. Negative for suicidal ideas and memory loss. The patient is nervous/anxious. The patient does not have insomnia.     Past Medical History  Diagnosis Date  . Hypertension   . GERD (gastroesophageal reflux disease)     hx pud '98  . Arthritis     djd  . Restless leg syndrome   .  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 Dysrhythmia     hx tachy palpitations  . Headache(784.0)     remote hx of migraines  . Neuromuscular disorder     peripheral neuropathy, FEET AND LEGS  . Depression     [facility] admission '09  . Stroke     ? 6 months ago - tests okay - [facility]   . Cellulitis     10/11/11 hospitalized for cellulitis   . Hyperlipemia   . Peripheral neuropathy   . Elbow fracture, left April 2015    Past Surgical History  Procedure Laterality Date  . Cervical disc surgery x2  2011  . Rt carotid enarterectomy  2011  . Rt knee arthroscopy  '99  . Left knee  arthroscopy  2001  . Hematoma evacuation  2011    s/p rt cea  . Joint replacement  '01    total knee replacement, LEFT  . Abdominal hysterectomy  1975    bil oophorectomy  . Back surgery   MANY YRS AGO    laminectomy x3  . Cholecystectomy  1993  . Total knee arthroplasty  09/13/2011    Procedure: TOTAL KNEE ARTHROPLASTY;  Surgeon: Magnus Sinning, MD;  Location: [facility];  Service: Orthopedics;  Laterality: Right;  . Right knee replacement      09/2011   . Carpal tunnel release  07/09/2012    Procedure: CARPAL TUNNEL RELEASE;  Surgeon: Magnus Sinning, MD;  Location: [facility];  Service: Orthopedics;  Laterality: Left;  . Finger arthroplasty  07/09/2012    Procedure: FINGER ARTHROPLASTY;  Surgeon: Magnus Sinning, MD;  Location: [facility];  Service: Orthopedics;  Laterality: Left;  Interposition Arthroplasty [facility] Joint Thumb Left   . Lipoma excision  07/09/2012    Procedure: EXCISION LIPOMA;  Surgeon: Magnus Sinning, MD;  Location: [facility];  Service: Orthopedics;  Laterality: Left;  Excision Lipoma Dorsum Left Wrist   . Orif ankle fracture Right 09/14/2014    FIBULA   . Orif ankle fracture Right 09/14/2014    Procedure: OPEN REDUCTION INTERNAL FIXATION (ORIF) RIGHT ANKLE FRACTURE/SYNDESMOSIS ;  Surgeon: Wylene Simmer, MD;  Location: [facility];  Service: Orthopedics;  Laterality: Right;    Allergies  Allergen Reactions  . Contrast Media [Iodinated Diagnostic Agents] Other (See Comments)    Respiratory failure  . Sulfa Drugs Cross Reactors Nausea And Vomiting   Medications: Patient's Medications  New Prescriptions   TRAMADOL (ULTRAM) 50 MG TABLET    Take 1 tablet (50 mg total) by mouth every 6 (six) hours as needed.  Previous Medications   ACETAMINOPHEN (TYLENOL) 325 MG TABLET    Take 2 tablets (650 mg total) by mouth every 6 (six) hours as needed for mild pain (or Fever >/= 101).   AMOXICILLIN (AMOXIL) 500 MG CAPSULE    Take 2,000 mg by mouth See admin instructions. Must take 4 capsules 1 hour  before dental procedures   ASPIRIN 325 MG TABLET    Take 325 mg by mouth daily.   ATORVASTATIN (LIPITOR) 20 MG TABLET    Take 1 tablet by mouth at  bedtime   CALCIUM CARBONATE 1250 MG CAPSULE    Take 1,250 mg by mouth daily.    CARBIDOPA-LEVODOPA (SINEMET CR) 50-200 MG PER TABLET    Take 1 tablet by mouth at bedtime.   CARBIDOPA-LEVODOPA (SINEMET IR) 25-250 MG PER TABLET    Take one tablet by mouth three times  daily with meals   CHOLECALCIFEROL (VITAMIN D) 1000 UNITS TABLET    Take 1,000 Units by mouth daily.   DULOXETINE (CYMBALTA) 60 MG CAPSULE    Take 1 capsule by mouth  twice a day for depression   FLUOXETINE (PROZAC) 20 MG TABLET    Take 1 tablet by mouth  daily as directed   FLUTICASONE (FLONASE) 50 MCG/ACT NASAL SPRAY    SHAKE WELL AND USE 1 SPRAY IN EACH NOSTRIL DAILY AS NEEDED FOR ALLERGIES OR RHINITIS   GABAPENTIN (NEURONTIN) 800 MG TABLET    Take 1 tablet (800 mg total) by mouth 4 (four) times daily.   HYDROCHLOROTHIAZIDE (HYDRODIURIL) 25 MG TABLET    Take 1 tablet by mouth in  the morning   LORAZEPAM (ATIVAN) 1 MG TABLET    Take 1 tablet (1 mg total) by mouth every 8 (eight) hours as needed for anxiety.   MECLIZINE (ANTIVERT) 25 MG TABLET    Take one tablet by mouth once daily as needed for dizziness.   NYSTATIN (MYCOSTATIN/NYSTOP) 100000 UNIT/GM POWD    Apply beneath left breast for yeast rash   PHENYLEPHRINE HCL (AFRIN ALLERGY NA)    Place 2 sprays into the nose daily as needed (Nasal congestion).   POTASSIUM CHLORIDE SA (K-DUR,KLOR-CON) 20 MEQ TABLET    Take one tablet daily for potassium   PYRIDOXINE (VITAMIN B-6) 50 MG TABLET    Take 50 mg by mouth daily.   SUMATRIPTAN (IMITREX) 50 MG TABLET    See AUX Label   TEMAZEPAM (RESTORIL) 7.5 MG CAPSULE    Take 1 capsule (7.5 mg total) by mouth at bedtime as needed for sleep.  Modified Medications   No medications on file  Discontinued Medications   ATORVASTATIN (LIPITOR) 40 MG TABLET    Take one tablet daily for cholesterol    DOXYCYCLINE (VIBRA-TABS) 100 MG TABLET    Take one tablet by mouth twice daily for bronchitis   SACCHAROMYCES BOULARDII (FLORASTOR) 250 MG CAPSULE    Take 1 capsule (250 mg total) by mouth 2 (two) times daily.    Physical Exam: Filed Vitals:   12/03/14 1051  BP: 150/82  Pulse: 120  Temp: 99.1 F (37.3 C)  TempSrc: Oral  Resp: 20  Height: 5\' 5"  (1.651 m)  Weight: 220 lb 12.8 oz (100.154 kg)  SpO2: 95%   Physical Exam  Constitutional: [name] oriented to person, place, and time. She appears well-developed and well-nourished. No distress.  Obese white female seated in chair  Cardiovascular: Normal rate, regular rhythm, normal heart sounds and intact distal pulses.   Pulmonary/Chest: Effort normal and breath sounds normal. No respiratory distress. She has no wheezes. She has no rales.  Abdominal: Soft. Bowel sounds are normal. She exhibits no distension. There is no tenderness.  Musculoskeletal: She exhibits edema and tenderness.  Walking with rolling walker today; right ankle with significant swelling and tenderness, but has improved; scars from screws and plate  Neurological: [name] alert and oriented to person, place, and time.  Skin: Skin is warm and dry.  Easy bruising/senile purpura present on arms, legs  Psychiatric:  Tearful at times during appt    Labs reviewed: Basic Metabolic Panel:  Recent Labs  09/19/14 0619 09/20/14 0810 11/11/14 1135  NA 137 138 143  K 2.7* 4.6 3.3*  CL 92* 97 96*  CO2 33* 27 28  GLUCOSE 150* 140* 160*  BUN 10 13 7*  CREATININE 0.77 0.87 0.92  CALCIUM 9.6 9.8 9.5  MG 1.7  --   --    Liver Function Tests:  Recent Labs  01/11/14 1514 08/07/14 0614  AST 10 22  ALT <5 <5  ALKPHOS 117 80  BILITOT 0.2 1.0  PROT 7.0 8.2  ALBUMIN  --  4.4   No results for input(s): LIPASE, AMYLASE in the last 8760 hours. No results for input(s): AMMONIA in the last 8760 hours. CBC:  Recent Labs  01/11/14 1514  08/07/14 0614 09/14/14 1234  09/17/14 0500 11/11/14 1135  WBC 12.4*  < > 10.1 11.3* 10.7* 7.5  NEUTROABS 8.8*  --  6.6  --   --  3.7  HGB 12.7  --  14.2 11.9* 10.4*  --   HCT 38.2  --  43.5 37.7 33.5* 39.8  MCV 93  --  94.6 95.0 97.4  --   PLT  --   --  356 348 266  --   < > = values in this interval not displayed. Lipid Panel:  Recent Labs  01/11/14 1514 11/11/14 1135  CHOL 199 222*  HDL 40 39*  LDLCALC Comment 110*  TRIG 462* 364*  CHOLHDL 5.0* 5.7*   Lab Results  Component Value Date   HGBA1C 7.1* 11/11/2014   Assessment/Plan 1. Closed right ankle fracture, sequela -improving, no longer in boot and now ambulating with her walker -continues on hydrocodone for this pain and tramadol which she called back to request tramadol after her visit was over, but I see she is to get her hydrocodone Monday.   - CBC with Differential/Platelet; Future -keep f/u Dr. Doran Durand  2. Depression -seems worse--wanted paxil stopped last time and is on cymbalta high dose of 60mg  po bid, plus her ativan and restoril -she's at high risk for suicide attempt so I opted not to give her more pain meds in addition to her already extensive supply  3. Essential hypertension -bp elevated today, encouraged low sodium diet, did not change meds  4. Polyneuropathy in other diseases classified elsewhere -cont high dosage of gabapentin  -be careful not to oversedate with tramadol and hydrocodone, also (gets from neuro)--was felt she was oversedated with her medications when she became delirious after her contrast study at the hospital (she says she was having a reaction to the contrast)--she also takes restoril, ativan -appears she got 7 tramadol today from Dr Jannifer Franklin' office when we did not grant her request--she'll be getting the hydrocodone Monday  5. Hyperglycemia -discussed that she is moving toward diabetes and dietary changes were discussed and encouraged exercises even if they are done in her chair - Hemoglobin A1c; Future  6.  Hyperlipidemia - not at goal last check, but statin was just adjusted so f/u next time and make dietary changes  - Lipid panel; Future  7. Hypokalemia -f/u lab today due to her previously low level, no on supplement - Basic metabolic panel  Labs/tests ordered:   Orders Placed This Encounter  Procedures  . Basic metabolic panel    Order Specific Question:  Has the patient fasted?    Answer:  Yes  . CBC with Differential/Platelet    Standing Status: Future     Number of Occurrences:      Standing Expiration Date: 06/05/2015  . Hemoglobin A1c    Standing Status: Future     Number of Occurrences:      Standing Expiration Date: 06/05/2015  . Lipid panel    Standing Status: Future     Number of Occurrences:  Standing Expiration Date: 06/05/2015    Order Specific Question:  Has the patient fasted?    Answer:  Yes    Next appt:  3 mos  Raymond Azure L. Dae Highley, D.O. [facility] [address], [place] [address] Cell Phone (Mon-Fri 8am-5pm):  [phone] On Call:  [phone] & follow prompts after 5pm & weekends Office Phone:  [phone] Office Fax:  [phone]

## 2014-12-03 NOTE — Telephone Encounter (Signed)
Pt is calling to get refill on tramadol, it is not listed on her medication list , pt is addiment that Dr. Jannifer Franklin  prescribed it to her. She states that she only needs 5-6 pills. Please call and advise  [phone].  Pharmacy [facility] on  [address]

## 2014-12-04 LAB — BASIC METABOLIC PANEL
BUN/Creatinine Ratio: 15 (ref 11–26)
BUN: 12 mg/dL (ref 8–27)
CO2: 21 mmol/L (ref 18–29)
Calcium: 9.6 mg/dL (ref 8.7–10.3)
Chloride: 96 mmol/L — ABNORMAL LOW (ref 97–108)
Creatinine, Ser: 0.82 mg/dL (ref 0.57–1.00)
GFR calc Af Amer: 83 mL/min/{1.73_m2} (ref >59)
GFR calc non Af Amer: 72 mL/min/{1.73_m2} (ref >59)
Glucose: 116 mg/dL — ABNORMAL HIGH (ref 65–99)
Potassium: 3.9 mmol/L (ref 3.5–5.2)
Sodium: 139 mmol/L (ref 134–144)

## 2014-12-05 ENCOUNTER — Telehealth: Payer: Self-pay | Admitting: Neurology

## 2014-12-05 NOTE — Telephone Encounter (Signed)
I called the patient. She called to get more pain medication. I indicated that if is not yet time. The patient seems confused. I am concerned that she is not taking her medications properly, she may have dementia. No RX were given.

## 2014-12-08 ENCOUNTER — Telehealth: Payer: Self-pay | Admitting: Internal Medicine

## 2014-12-08 ENCOUNTER — Telehealth: Payer: Self-pay | Admitting: Neurology

## 2014-12-08 ENCOUNTER — Telehealth: Payer: Self-pay

## 2014-12-08 DIAGNOSIS — S82451D Displaced comminuted fracture of shaft of right fibula, subsequent encounter for closed fracture with routine healing: Secondary | ICD-10-CM | POA: Diagnosis not present

## 2014-12-08 MED ORDER — HYDROCODONE-ACETAMINOPHEN 5-325 MG PO TABS: 1.0000 | ORAL_TABLET | Freq: Four times a day (QID) | ORAL | Status: DC | PRN

## 2014-12-08 NOTE — Telephone Encounter (Signed)
Patient called back to see if her medication was ready for pick up. Please call and advise.

## 2014-12-08 NOTE — Telephone Encounter (Signed)
Informed Mrs. Wynonia Lawman

## 2014-12-08 NOTE — Telephone Encounter (Signed)
COLOGUARD COLLECTION: Pt non compliant with colorectal cancer screening.  Did not return the package per Exact letter. cdavis

## 2014-12-08 NOTE — Telephone Encounter (Signed)
Request has been forwarded to provider for review.

## 2014-12-08 NOTE — Telephone Encounter (Signed)
I was going to refill this but after looking at Dr. Tobey Grim phone notes from 7/1 there were some concerns about being overmedicated and not taking the medication properly. I will let Dr. Jannifer Franklin decide if wants to continue giving the patient this medication. He will be in the office this afternoon. Thanks.

## 2014-12-08 NOTE — Telephone Encounter (Signed)
Patient called requesting refill for hydrocodone. She states Megan told her on last OV is would be ready today. I could not find RX for hydrocodone. Please call and advise. Patient can be reached at [phone].

## 2014-12-08 NOTE — Telephone Encounter (Signed)
Spoke with Mrs Bolla, says she will contact her son and call us back to schedule...cdavis

## 2014-12-08 NOTE — Telephone Encounter (Signed)
Rx ready for pick up. 

## 2014-12-08 NOTE — Telephone Encounter (Signed)
Dr. Jannifer Franklin has already refilled this medication.

## 2014-12-09 ENCOUNTER — Other Ambulatory Visit: Payer: Self-pay | Admitting: *Deleted

## 2014-12-09 ENCOUNTER — Other Ambulatory Visit: Payer: Medicare Other

## 2014-12-09 DIAGNOSIS — E876 Hypokalemia: Secondary | ICD-10-CM

## 2014-12-10 LAB — BASIC METABOLIC PANEL
BUN/Creatinine Ratio: 23 (ref 11–26)
BUN: 27 mg/dL (ref 8–27)
CO2: 25 mmol/L (ref 18–29)
Calcium: 9.5 mg/dL (ref 8.7–10.3)
Chloride: 88 mmol/L — ABNORMAL LOW (ref 97–108)
Creatinine, Ser: 1.17 mg/dL — ABNORMAL HIGH (ref 0.57–1.00)
GFR calc Af Amer: 54 mL/min/{1.73_m2} — ABNORMAL LOW (ref >59)
GFR calc non Af Amer: 47 mL/min/{1.73_m2} — ABNORMAL LOW (ref >59)
Glucose: 109 mg/dL — ABNORMAL HIGH (ref 65–99)
Potassium: 3.4 mmol/L — ABNORMAL LOW (ref 3.5–5.2)
Sodium: 136 mmol/L (ref 134–144)

## 2014-12-13 DIAGNOSIS — Z780 Asymptomatic menopausal state: Secondary | ICD-10-CM | POA: Diagnosis not present

## 2014-12-13 DIAGNOSIS — Z13820 Encounter for screening for osteoporosis: Secondary | ICD-10-CM | POA: Diagnosis not present

## 2014-12-13 LAB — HM DEXA SCAN

## 2014-12-16 ENCOUNTER — Other Ambulatory Visit: Payer: Self-pay

## 2014-12-16 MED ORDER — LORAZEPAM 1 MG PO TABS: 1.0000 mg | ORAL_TABLET | Freq: Three times a day (TID) | ORAL | Status: DC | PRN

## 2014-12-24 ENCOUNTER — Telehealth: Payer: Self-pay

## 2014-12-24 ENCOUNTER — Other Ambulatory Visit: Payer: Self-pay | Admitting: Internal Medicine

## 2014-12-24 ENCOUNTER — Other Ambulatory Visit: Payer: Self-pay | Admitting: *Deleted

## 2014-12-24 MED ORDER — POTASSIUM CHLORIDE CRYS ER 20 MEQ PO TBCR: EXTENDED_RELEASE_TABLET | ORAL | Status: DC

## 2014-12-24 NOTE — Telephone Encounter (Signed)
Pharmacy indicates patient would like a Rx for Tramadol 50mg  one every six hours as needed #60.  Would you like to fill at this time?  Please advise.  Thank you.

## 2014-12-24 NOTE — Telephone Encounter (Signed)
I am concerned with overmedication with this patient. I will discontinue the Ultram.

## 2014-12-24 NOTE — Telephone Encounter (Signed)
Patient requested Rx to be faxed to [facility].

## 2014-12-26 DIAGNOSIS — H2513 Age-related nuclear cataract, bilateral: Secondary | ICD-10-CM | POA: Diagnosis not present

## 2014-12-27 ENCOUNTER — Other Ambulatory Visit: Payer: Self-pay | Admitting: *Deleted

## 2014-12-27 DIAGNOSIS — Z4789 Encounter for other orthopedic aftercare: Secondary | ICD-10-CM | POA: Diagnosis not present

## 2014-12-27 DIAGNOSIS — S82451D Displaced comminuted fracture of shaft of right fibula, subsequent encounter for closed fracture with routine healing: Secondary | ICD-10-CM | POA: Diagnosis not present

## 2014-12-27 MED ORDER — TEMAZEPAM 7.5 MG PO CAPS: 7.5000 mg | ORAL_CAPSULE | Freq: Every evening | ORAL | Status: DC | PRN

## 2014-12-27 NOTE — Telephone Encounter (Signed)
Programme researcher, broadcasting/film/video

## 2014-12-29 ENCOUNTER — Telehealth: Payer: Self-pay | Admitting: Neurology

## 2014-12-29 NOTE — Telephone Encounter (Signed)
Patient is returning a call. °

## 2014-12-29 NOTE — Telephone Encounter (Signed)
This patient does have a peripheral neuropathy, but she is on very high-dose gabapentin taking 800 mg 4 times daily. It is possible that the gabapentin may be having some impact on the balance. The patient will be seen through this office this week.

## 2014-12-29 NOTE — Telephone Encounter (Signed)
I called the patient and left a voicemail.

## 2014-12-29 NOTE — Telephone Encounter (Signed)
I called the patient. Unable to leave a voicemail. Phone just kept ringing.

## 2014-12-29 NOTE — Telephone Encounter (Signed)
I called the patient. She has been falling frequently. She recently saw Dr. Doran Durand and he believes it may be related to the patient taking Gabapentin. She requested an appointment to see Dr. Jannifer Franklin to discuss Gabapentin. Appointment scheduled 7/29. It looks like Dr. Mariea Clonts prescribes the gabapentin. I advised the patient to call Dr. Mariea Clonts and also let her know what is going on. She stated she wanted Dr. Jannifer Franklin' opinion first.

## 2014-12-29 NOTE — Telephone Encounter (Signed)
Patient called and requested to speak with the nurse regarding some issues she has been having. She has been experiencing extreme balance issues, she has fallen several times, and explains that she feels "drunk". Her surgeon Dr. Doran Durand would like for her to consul with Dr. Jannifer Franklin, he believes her Rx. GABAPENTIN may be causing some of these problems. She would like to discuss these issues with the nurse and know if she should come in to see Dr. Jannifer Franklin. Please call and advise.

## 2014-12-31 ENCOUNTER — Telehealth: Payer: Self-pay

## 2014-12-31 ENCOUNTER — Ambulatory Visit: Payer: Self-pay | Admitting: Neurology

## 2014-12-31 NOTE — Telephone Encounter (Signed)
Patient did not come to a follow up appointment today.

## 2015-01-03 ENCOUNTER — Telehealth: Payer: Self-pay | Admitting: Neurology

## 2015-01-03 NOTE — Telephone Encounter (Signed)
I called the patient. Appointment scheduled for tomorrow (8/2).

## 2015-01-03 NOTE — Telephone Encounter (Signed)
Patient called requesting appt to see Dr [name] for falls- she states the PCP thinks her medication needs to be adjusted- she states when walking she gets ahead of herself. Dr [name] is booked out until the middle of November. She had an appt on 7/29 but had to c/a due to stomach flu. Please call and advise when she can be seen. She can be reached at [phone].

## 2015-01-04 ENCOUNTER — Telehealth: Payer: Self-pay | Admitting: Neurology

## 2015-01-04 ENCOUNTER — Ambulatory Visit: Payer: Self-pay | Admitting: Neurology

## 2015-01-04 ENCOUNTER — Other Ambulatory Visit: Payer: Self-pay | Admitting: *Deleted

## 2015-01-04 ENCOUNTER — Telehealth: Payer: Self-pay | Admitting: *Deleted

## 2015-01-04 MED ORDER — POTASSIUM CHLORIDE CRYS ER 20 MEQ PO TBCR: EXTENDED_RELEASE_TABLET | ORAL | Status: DC

## 2015-01-04 NOTE — Telephone Encounter (Signed)
This is the second no show for a revisit appointment today. No further refills on hydrocodone until seen.

## 2015-01-04 NOTE — Telephone Encounter (Signed)
Dr. Doran Durand recommended patient to talk with you regarding decreasing the Gabapentin from 800mg  four times daily to 600mg  four times daily due to patient balance and falling. He thinks this [name] help. Please Advise.

## 2015-01-04 NOTE — Telephone Encounter (Signed)
Patient requested to be sent to [facility] 

## 2015-01-05 ENCOUNTER — Telehealth: Payer: Self-pay | Admitting: Neurology

## 2015-01-05 NOTE — Telephone Encounter (Signed)
Pt is requesting re-fill on HYDROcodone-acetaminophen (NORCO/VICODIN) 5-325 MG per tablet ° °

## 2015-01-05 NOTE — Telephone Encounter (Signed)
This patient has no showed for appointments on 2 occasions, need a revisit before any further refills.

## 2015-01-06 ENCOUNTER — Encounter: Payer: Self-pay | Admitting: Adult Health

## 2015-01-06 ENCOUNTER — Telehealth: Payer: Self-pay | Admitting: Neurology

## 2015-01-06 ENCOUNTER — Ambulatory Visit (INDEPENDENT_AMBULATORY_CARE_PROVIDER_SITE_OTHER): Payer: Medicare Other | Admitting: Adult Health

## 2015-01-06 VITALS — BP 154/81 | HR 106 | Wt 218.0 lb

## 2015-01-06 DIAGNOSIS — G2581 Restless legs syndrome: Secondary | ICD-10-CM

## 2015-01-06 DIAGNOSIS — G609 Hereditary and idiopathic neuropathy, unspecified: Secondary | ICD-10-CM

## 2015-01-06 DIAGNOSIS — Z5181 Encounter for therapeutic drug level monitoring: Secondary | ICD-10-CM

## 2015-01-06 MED ORDER — GABAPENTIN 600 MG PO TABS: 600.0000 mg | ORAL_TABLET | Freq: Four times a day (QID) | ORAL | Status: DC

## 2015-01-06 MED ORDER — HYDROCODONE-ACETAMINOPHEN 5-325 MG PO TABS: 1.0000 | ORAL_TABLET | Freq: Four times a day (QID) | ORAL | Status: DC | PRN

## 2015-01-06 NOTE — Progress Notes (Signed)
PATIENT: [name] DOB: May 14, 1941  REASON FOR VISIT: follow up- peripheral neuropathy, RLS HISTORY FROM: patient  HISTORY OF PRESENT ILLNESS: [name] is a 74 year old female with a history of peripheral neuropathy and restless leg syndrome. She returns today for follow-up. The patient states that she continues to have increased pain in the right foot. She states that her pain is consistent with neuropathy discomfort. She describes the pain as a burning and tingling pain. She has this discomfort in both feet however the right is greater than left. She states that this occurred after her surgery. She has been taking hydrocodone 3 times a day as well as gabapentin 800 mg 4 times a day. The patient does state that she feels that her balance is off. She has had multiple falls. She states that she did fall backwards out of her wheelchair right after surgery and hit her head causing a laceration. She did go to the emergency room and per the patient a scan of the brain was completed  that was negative. These results are not available to me. The patient does report some issues with her memory. She states that she notices that she has some word finding issues. She knows what she wants to say but cannot get the right words out. She returns today for an evaluation.  HISTORY 12/02/14: [name] is a 73 year old female with a history of peripheral neuropathy and restless leg syndrome. She returns today for follow-up. The patient states that she fell in April getting out of her bathtub and broke her right foot. She has surgery April 13. She states that since the surgery she has been having increased pain in her right foot. She continues to take gabapentin and states that works well. She also has a TENS unit and states she gets good benefit from that. Patient continues to use Sinemet for restless leg syndrome. She states this controls her symptoms well. Patient also takes hydrocodone for pain. She had a  refill at the beginning of June the called in last week warning a refill. She states that she had to take more of the medication due to her foot pain. Her Willis caught in her a one-time prescription of Ultram. The patient states that she still has pills left over from this. She returns today for an evaluation.  HISTORY 05/20/14: [name] is a 74 year old female with a history of peripheral neuropathy and restless legs. She returns today for follow-up. [name] currently taking Vicodin for pain as well as gabapentin. She also uses Sinemet for RLS. She report this is working well. Patient reports [facility] and states that she was not impressed. She does have a follow up appointment 1/7. She recently bought a TENS unit and using on the right foot. She states this is working well for her. She reports that she has numbness and tingling in both feet. Patient states that her balance has gotten a little worse. She uses a cane more often. Denies any falls.   HISTORY 04/08/13 (CM): 74 year old right-handed white female with a history of a peripheral neuropathy, obesity, and degenerative arthritis. 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 [name] still having pain in the knee. She also has back pain followed by Dr. Ellene Route. [name] not using any assistive device. The patient has been taking Vicodin for pain. The patient has restless leg syndrome, and takes Sinemet for this with good benefit. The patient indicates that  she is able to rest at night fairly well.She also takes neurontin for her neuropathy. Symptoms are fairly well controlled. She has been having problems with left hand,recent surgery on left thumb and CT release on the left   REVIEW OF SYSTEMS: Out of a complete 14 system review of symptoms, the patient complains only of the following symptoms, and all other reviewed systems are negative.  Activity change, fatigue, restless leg, joint pain, joint swelling,  weakness, depression, nervous/anxious  ALLERGIES: Allergies  Allergen Reactions  . Contrast Media [Iodinated Diagnostic Agents] Other (See Comments)    Respiratory failure  . Sulfa Drugs Cross Reactors Nausea And Vomiting    HOME MEDICATIONS: Outpatient Prescriptions Prior to Visit  Medication Sig Dispense Refill  . acetaminophen (TYLENOL) 325 MG tablet Take 2 tablets (650 mg total) by mouth every 6 (six) hours as needed for mild pain (or Fever >/= 101).    Marland Kitchen amoxicillin (AMOXIL) 500 MG capsule Take 2,000 mg by mouth See admin instructions. Must take 4 capsules 1 hour before dental procedures    . aspirin 325 MG tablet Take 325 mg by mouth daily.    Marland Kitchen atorvastatin (LIPITOR) 20 MG tablet Take 1 tablet by mouth at  bedtime 90 tablet 0  . calcium carbonate 1250 MG capsule Take 1,250 mg by mouth daily.     . carbidopa-levodopa (SINEMET CR) 50-200 MG per tablet Take 1 tablet by mouth at bedtime. 90 tablet 3  . carbidopa-levodopa (SINEMET IR) 25-250 MG per tablet Take one tablet by mouth three times daily with meals 270 tablet 3  . cholecalciferol (VITAMIN D) 1000 UNITS tablet Take 1,000 Units by mouth daily.    . DULoxetine (CYMBALTA) 60 MG capsule TAKE 1 CAPSULE BY MOUTH  TWICE A DAY FOR DEPRESSION 60 capsule 5  . fluticasone (FLONASE) 50 MCG/ACT nasal spray SHAKE WELL AND USE 1 SPRAY IN EACH NOSTRIL DAILY AS NEEDED FOR ALLERGIES OR RHINITIS 48 g 1  . gabapentin (NEURONTIN) 800 MG tablet Take 1 tablet (800 mg total) by mouth 4 (four) times daily. 360 tablet 3  . hydrochlorothiazide (HYDRODIURIL) 25 MG tablet Take 1 tablet by mouth in  the morning 90 tablet 1  . HYDROcodone-acetaminophen (NORCO/VICODIN) 5-325 MG per tablet Take 1 tablet by mouth every 6 (six) hours as needed (Must last 28 days). 90 tablet 0  . LORazepam (ATIVAN) 1 MG tablet Take 1 tablet (1 mg total) by mouth every 8 (eight) hours as needed for anxiety. 90 tablet 1  . meclizine (ANTIVERT) 25 MG tablet Take one tablet by mouth  once daily as needed for dizziness. 30 tablet 0  . nystatin (MYCOSTATIN/NYSTOP) 100000 UNIT/GM POWD Apply beneath left breast for yeast rash 15 g 1  . Phenylephrine HCl (AFRIN ALLERGY NA) Place 2 sprays into the nose daily as needed (Nasal congestion).    . potassium chloride SA (K-DUR,KLOR-CON) 20 MEQ tablet Take two tablet daily for potassium (Patient taking differently: 40 mEq. Take two tablet daily for potassium) 180 tablet 1  . pyridOXINE (VITAMIN B-6) 50 MG tablet Take 50 mg by mouth daily.    . SUMAtriptan (IMITREX) 50 MG tablet See AUX Label 30 tablet 2  . temazepam (RESTORIL) 7.5 MG capsule Take 1 capsule (7.5 mg total) by mouth at bedtime as needed for sleep. 90 capsule 1   No facility-administered medications prior to visit.    PAST MEDICAL HISTORY: Past Medical History  Diagnosis Date  . Hypertension   . GERD (gastroesophageal reflux disease)  hx pud '98  . Arthritis     djd  . Restless leg syndrome   . 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 Dysrhythmia     hx tachy palpitations  . Headache(784.0)     remote hx of migraines  . Neuromuscular disorder     peripheral neuropathy, FEET AND LEGS  . Depression     bh admission '09  . Stroke     ? 6 months ago - tests okay - [facility]   . Cellulitis     10/11/11 hospitalized for cellulitis   . Hyperlipemia   . Peripheral neuropathy   . Elbow fracture, left April 2015    PAST SURGICAL HISTORY: Past Surgical History  Procedure Laterality Date  . Cervical disc surgery x2  2011  . Rt carotid enarterectomy  2011  . Rt knee arthroscopy  '99  . Left knee arthroscopy  2001  . Hematoma evacuation  2011    s/p rt cea  . Joint replacement  '01    total knee replacement, LEFT  . Abdominal hysterectomy  1975    bil oophorectomy  . Back surgery   MANY YRS AGO    laminectomy x3  . Cholecystectomy  1993  . Total knee arthroplasty  09/13/2011    Procedure: TOTAL KNEE ARTHROPLASTY;  Surgeon: Magnus Sinning, MD;   Location: [facility];  Service: Orthopedics;  Laterality: Right;  . Right knee replacement      09/2011   . Carpal tunnel release  07/09/2012    Procedure: CARPAL TUNNEL RELEASE;  Surgeon: Magnus Sinning, MD;  Location: [facility];  Service: Orthopedics;  Laterality: Left;  . Finger arthroplasty  07/09/2012    Procedure: FINGER ARTHROPLASTY;  Surgeon: Magnus Sinning, MD;  Location: [facility];  Service: Orthopedics;  Laterality: Left;  Interposition Arthroplasty [facility] Joint Thumb Left   . Lipoma excision  07/09/2012    Procedure: EXCISION LIPOMA;  Surgeon: Magnus Sinning, MD;  Location: [facility];  Service: Orthopedics;  Laterality: Left;  Excision Lipoma Dorsum Left Wrist   . Orif ankle fracture Right 09/14/2014    FIBULA   . Orif ankle fracture Right 09/14/2014    Procedure: OPEN REDUCTION INTERNAL FIXATION (ORIF) RIGHT ANKLE FRACTURE/SYNDESMOSIS ;  Surgeon: Wylene Simmer, MD;  Location: [place];  Service: Orthopedics;  Laterality: Right;    FAMILY HISTORY: Family History  Problem Relation Age of Onset  . Congestive Heart Failure Father   . Congestive Heart Failure Sister   . Alzheimer's disease Mother   . Diabetes Brother   . Arthritis Brother     SOCIAL HISTORY: History   Social History  . Marital Status: Widowed    Spouse Name: N/A  . Number of Children: 2  . Years of Education: 14   Occupational History  . Not on file.   Social History Main Topics  . Smoking status: Never Smoker   . Smokeless tobacco: Never Used  . Alcohol Use: No  . Drug Use: No  . Sexual Activity: Not on file   Other Topics Concern  . Not on file   Social History Narrative   Patient is widowed and lives alone.   Patient has two sons.   Patient is retired.   Patient has a college education.   Patient is right-handed.   Patient drinks three glasses of Coke-soda daily.            PHYSICAL EXAM  Filed  Vitals:   01/06/15 1458  BP: 154/81  Pulse: 106  Weight: 218 lb (98.884 kg)   Body mass index is  36.28 kg/(m^2).  Generalized: Well developed, in no acute distress   Neurological examination  Mentation: Alert oriented to time, place, history taking. Follows all commands speech and language fluent. MMSE 30/30 Cranial nerve II-XII: Pupils were equal round reactive to light. Extraocular movements were full, visual field were full on confrontational test. Facial sensation and strength were normal. Uvula tongue midline. Head turning and shoulder shrug  were normal and symmetric. Motor: The motor testing reveals 5 over 5 strength of all 4 extremities. Good symmetric motor tone is noted throughout.  Sensory: Sensory testing is intact to soft touch on all 4 extremities. No evidence of extinction is noted.  Coordination: Cerebellar testing reveals good finger-nose-finger and heel-to-shin bilaterally.  Gait and station: Gait is slightly unsteady. She did not bring her walker today. Turns are unsteady. Tandem gait not attempted. Romberg is negative.  Reflexes: Deep tendon reflexes are symmetric and normal bilaterally.   DIAGNOSTIC DATA (LABS, IMAGING, TESTING) - I reviewed patient records, labs, notes, testing and imaging myself where available.  Lab Results  Component Value Date   WBC 7.5 11/11/2014   HGB 10.4* 09/17/2014   HCT 39.8 11/11/2014   MCV 97.4 09/17/2014   PLT 266 09/17/2014      Component Value Date/Time   NA 136 12/09/2014 1249   NA 138 09/20/2014 0810   K 3.4* 12/09/2014 1249   CL 88* 12/09/2014 1249   CO2 25 12/09/2014 1249   GLUCOSE 109* 12/09/2014 1249   GLUCOSE 140* 09/20/2014 0810   BUN 27 12/09/2014 1249   BUN 13 09/20/2014 0810   CREATININE 1.17* 12/09/2014 1249   CALCIUM 9.5 12/09/2014 1249   PROT 8.2 08/07/2014 0614   PROT 7.0 01/11/2014 1514   ALBUMIN 4.4 08/07/2014 0614   AST 22 08/07/2014 0614   ALT <5 08/07/2014 0614   ALKPHOS 80 08/07/2014 0614   BILITOT 1.0 08/07/2014 0614   GFRNONAA 47* 12/09/2014 1249   GFRAA 54* 12/09/2014 1249   Lab Results    Component Value Date   CHOL 222* 11/11/2014   HDL 39* 11/11/2014   LDLCALC 110* 11/11/2014   TRIG 364* 11/11/2014   CHOLHDL 5.7* 11/11/2014   Lab Results  Component Value Date   HGBA1C 7.1* 11/11/2014   No results found for: [id number] Lab Results  Component Value Date   TSH 2.909 10/13/2011      ASSESSMENT AND PLAN 74 y.o. year old female  has a past medical history of Hypertension; GERD (gastroesophageal reflux disease); Arthritis; Restless leg syndrome; PPD positive, treated (1987); Dysrhythmia; Headache(784.0); Neuromuscular disorder; Depression; Stroke; Cellulitis; Hyperlipemia; Peripheral neuropathy; and Elbow fracture, left (April 2015). here with:  1. Peripheral neuropathy 2. Restless leg syndrome  The patient continues to have discomfort in the feet that she describes as burning and tingling pain. However the patient is having multiple falls as well as issues with word finding. I have consulted with Dr. Jannifer Franklin. The patient's gabapentin will be decreased to 600 mg 4 times a day. I will also decrease the amount of hydrocodone that the patient receives. She will now on receive 60 tablets a month versus 90. The patient signed a narcotic agreement today. We will also check a urine drug screen. The patient verbalized understanding. If her symptoms worsen or she develops new symptoms she should let us know. She will follow-up in 3-4 months or sooner  if needed.     Ward Givens, MSN, NP-C 01/06/2015, 3:05 PM [facility] [address], [place], [place] [address] [phone]  Note: This document was prepared with digital dictation and possible smart phrase technology. Any transcriptional errors that result from this process are unintentional.

## 2015-01-06 NOTE — Telephone Encounter (Signed)
Patient called back to check status of refill. I advised pt that we would need to see her before we can refill since no show x 2. Pt states she has appt on the 11th. I advised they may not be able to fill until then but would send you a msg. Pt states she no showed one appt due to flu and another due to fall.

## 2015-01-06 NOTE — Patient Instructions (Signed)
Decrease gabapentin to 600 mg four times a day Hydrocodone refilled- try to take only for severe pain Use walker at all times If your symptoms worsen or you develop new symptoms please let us know.

## 2015-01-06 NOTE — Telephone Encounter (Signed)
I called the patient. Appointment scheduled with Jinny Blossom today at 3 PM.

## 2015-01-07 LAB — DRUG SCREEN, URINE
Amphetamines, Urine: NEGATIVE ng/mL
BENZODIAZEPINE QUANT UR: POSITIVE ng/mL
Barbiturate screen, urine: NEGATIVE ng/mL
COCAINE (METAB.): NEGATIVE ng/mL
Cannabinoid Quant, Ur: NEGATIVE ng/mL
OPIATE QUANT UR: NEGATIVE ng/mL
PCP Quant, Ur: NEGATIVE ng/mL

## 2015-01-07 NOTE — Progress Notes (Signed)
I have read the note, and I agree with the clinical assessment and plan.  WILLIS,CHARLES KEITH   

## 2015-01-08 DIAGNOSIS — S82451D Displaced comminuted fracture of shaft of right fibula, subsequent encounter for closed fracture with routine healing: Secondary | ICD-10-CM | POA: Diagnosis not present

## 2015-01-10 ENCOUNTER — Telehealth: Payer: Self-pay | Admitting: *Deleted

## 2015-01-10 ENCOUNTER — Telehealth: Payer: Self-pay

## 2015-01-10 NOTE — Telephone Encounter (Signed)
-----   Message from Ward Givens, NP sent at 01/10/2015  7:27 AM EDT ----- Lab work unremarkable

## 2015-01-10 NOTE — Telephone Encounter (Signed)
Called and spoke to patient relayed lab work was normal. Patient understood.

## 2015-01-10 NOTE — Telephone Encounter (Signed)
Please make note in chart that Mrs. Victorino now has a narcotic agreement with [facility]--they are dispensing her hydrocodone, as well as her gabapentin per Dr. Mariea Clonts.

## 2015-01-11 NOTE — Telephone Encounter (Signed)
error 

## 2015-01-13 ENCOUNTER — Ambulatory Visit: Payer: Self-pay | Admitting: Neurology

## 2015-01-14 ENCOUNTER — Other Ambulatory Visit: Payer: Self-pay

## 2015-01-14 MED ORDER — ATORVASTATIN CALCIUM 20 MG PO TABS: ORAL_TABLET | ORAL | Status: DC

## 2015-01-14 NOTE — Telephone Encounter (Signed)
Request [facility]

## 2015-01-17 ENCOUNTER — Telehealth: Payer: Self-pay | Admitting: Adult Health

## 2015-01-17 NOTE — Telephone Encounter (Signed)
Patient called requesting to speak to you about her medications, Gabapentin and hydrocodone, especially the hydrocodone.

## 2015-01-17 NOTE — Telephone Encounter (Signed)
Called and spoke to patient she is anxious about her running out of hydrocodone she only has 60 pills. Sometimes she is taking two and sometimes she has to take three tablets daily . Patient is requesting that Jinny Blossom calls her.

## 2015-01-17 NOTE — Telephone Encounter (Signed)
The patient will get only 60 tablets a month, if she runs out early, she will have to do without any hydrocodone for several days. We will not increase the number of tablets that she is given a month.

## 2015-01-17 NOTE — Telephone Encounter (Signed)
The patient wanted to let me know that she has been having to take 3 tablets/day some days. She may run out of the medication before her next refill. I asked the patient how many tablets she had left. She replied 10. This was refilled on the 8/4. She has been taking it for approximately 10 days so she has been taking at least 5 a day if she only has 10 left. I verbalized this to her. She then states that she hasn't actually counted them- there may be more left. She states that she isn't asking for a refill right now- just wanted me to know. I verbalized understanding and expressed she should try to limit taking this medication to only when she has severe pain. Patient verbalized understanding.

## 2015-01-31 ENCOUNTER — Other Ambulatory Visit: Payer: Self-pay | Admitting: *Deleted

## 2015-01-31 MED ORDER — LORAZEPAM 1 MG PO TABS: 1.0000 mg | ORAL_TABLET | Freq: Three times a day (TID) | ORAL | Status: DC | PRN

## 2015-01-31 MED ORDER — ATORVASTATIN CALCIUM 40 MG PO TABS: ORAL_TABLET | ORAL | Status: DC

## 2015-01-31 NOTE — Telephone Encounter (Signed)
Patient called and requested to be sent to [facility].

## 2015-02-01 ENCOUNTER — Telehealth: Payer: Self-pay | Admitting: Neurology

## 2015-02-01 NOTE — Telephone Encounter (Signed)
I tried to call the patient again.  Got no answer, no option to leave message.

## 2015-02-01 NOTE — Telephone Encounter (Signed)
Rx was last written on 08/04.  Per previous encounter:  Kathrynn Ducking, MD at 01/17/2015 5:58 PM     Status: Signed       Expand All Collapse All   The patient will get only 60 tablets a month, if she runs out early, she will have to do without any hydrocodone for several days. We will not increase the number of tablets that she is given a month.         I called back on home line x2, got no answer, no option to leave message.  Line would ring numerous times, then go to a busy signal.  Called cell, message said the number dialed is currently not in service.

## 2015-02-01 NOTE — Telephone Encounter (Signed)
I called again.  Spoke with the patient.  [name] aware it is a bit too soon to refill at this time.

## 2015-02-01 NOTE — Telephone Encounter (Signed)
Patient called requesting refill for HYDROcodone-acetaminophen (NORCO/VICODIN) 5-325 MG per tablet . Patient advise RX will be ready within 24 hours unless otherwise informed by RN.

## 2015-02-03 ENCOUNTER — Telehealth: Payer: Self-pay | Admitting: *Deleted

## 2015-02-03 ENCOUNTER — Telehealth: Payer: Self-pay

## 2015-02-03 MED ORDER — HYDROCODONE-ACETAMINOPHEN 5-325 MG PO TABS: 1.0000 | ORAL_TABLET | Freq: Four times a day (QID) | ORAL | Status: DC | PRN

## 2015-02-03 NOTE — Telephone Encounter (Signed)
Patient is requesting a refill on Hydrocodone.  Last written 08/04, noting must last 28 days.  Today is the 28th day.  Thank you.

## 2015-02-03 NOTE — Telephone Encounter (Signed)
Patient called and wanted to let you know her Psychiatrist is suppose to be calling you regarding he doesn't the the Cymbalta is not working for patient and recommends Zoloft instead. She stated that she would like to talk with you about this. Please Advise.

## 2015-02-03 NOTE — Telephone Encounter (Signed)
Patient called inquiring if refill was ready. Advised her to call back around 4pm

## 2015-02-03 NOTE — Telephone Encounter (Signed)
Will await call from her psychiatrist.  Thanks.

## 2015-02-04 ENCOUNTER — Other Ambulatory Visit: Payer: Self-pay | Admitting: Diagnostic Neuroimaging

## 2015-02-04 MED ORDER — HYDROCODONE-ACETAMINOPHEN 5-325 MG PO TABS: 1.0000 | ORAL_TABLET | Freq: Four times a day (QID) | ORAL | Status: DC | PRN

## 2015-02-04 NOTE — Telephone Encounter (Signed)
Provider auth this Rx yesterday.  Message sent to clinic to follow up.

## 2015-02-04 NOTE — Telephone Encounter (Signed)
Pt is calling back about medication refill for hydrocodone

## 2015-02-04 NOTE — Telephone Encounter (Signed)
Pt called and is requesting that someone call her to tell her when medication will be ready.

## 2015-02-08 DIAGNOSIS — S82451D Displaced comminuted fracture of shaft of right fibula, subsequent encounter for closed fracture with routine healing: Secondary | ICD-10-CM | POA: Diagnosis not present

## 2015-02-08 NOTE — Telephone Encounter (Signed)
Patient called and stated that she thought you talked with her Psychiatrist and you were going to be transitioning her to Zoloft today. Wants some called into [facility] and then a mail order Rx to [facility]. Please Advise.

## 2015-02-08 NOTE — Telephone Encounter (Signed)
Decrease cymbalta to 30mg  twice daily for one week (may take 1/2 of her current pill twice a day),  then 30mg  daily for one week (1/2 pill),  then stop.    New Rx:  Zoloft 50mg  pills # 25  Begin zoloft 25mg  daily for one week after stops cymbalta (1/2 of a 50mg ).   Then increase to zoloft 50mg  daily for one week (1 50mg ),  Then zoloft 100mg  daily thereafter (2 50mg ).  If at any point during this transition, she feels she is not stable, is feeling suicidal, she should seek immediate medical attention.

## 2015-02-09 ENCOUNTER — Other Ambulatory Visit: Payer: Self-pay | Admitting: Internal Medicine

## 2015-02-09 MED ORDER — SERTRALINE HCL 50 MG PO TABS: ORAL_TABLET | ORAL | Status: DC

## 2015-02-09 MED ORDER — DULOXETINE HCL 30 MG PO CPEP: ORAL_CAPSULE | ORAL | Status: DC

## 2015-02-09 NOTE — Telephone Encounter (Signed)
Patient states that her Cymbalta are Capsules and she cannot half them. What would you like for her to do? Please Advise.

## 2015-02-09 NOTE — Telephone Encounter (Signed)
Patient notified and agreed. Rx faxed to local pharmacy for cymbalta 30mg  taper and for the new Zoloft Rx taper. Also Rx faxed to [facility] for Zoloft Rx.

## 2015-02-09 NOTE — Telephone Encounter (Signed)
Send a prescription for cymbalta 30mg  capsules quantity sufficient for the taper

## 2015-02-15 ENCOUNTER — Telehealth: Payer: Self-pay | Admitting: Adult Health

## 2015-02-15 NOTE — Telephone Encounter (Signed)
I called the patient. She states that the Vicodin and gabapentin is not controlling her pain. She states that she is having to take at least 3 Vicodin a day. I advised the patient that I am not increasing the Vicodin. I will give the patient a compound cream to help with her neuropathy discomfort. In the future we may consider increasing gabapentin. Patient verbalized understanding.

## 2015-02-15 NOTE — Telephone Encounter (Signed)
Patient called stating she does not know where the opinion came from that she was falling due to HYDROcodone-acetaminophen (NORCO/VICODIN) 5-325 MG per tablet but she states it came from wearing the boot on her foot. She states she has not fallen since she stopped wearing the boot. There is a gel - Voltaren she would like to discuss with Megan. She states she doesn't expect all the pain to go away but when it is bad she does. Please call and advise. Patient can be reached [phone].

## 2015-02-17 ENCOUNTER — Ambulatory Visit: Payer: Medicare Other | Admitting: Internal Medicine

## 2015-02-21 DIAGNOSIS — M5416 Radiculopathy, lumbar region: Secondary | ICD-10-CM | POA: Diagnosis not present

## 2015-02-26 DIAGNOSIS — M5416 Radiculopathy, lumbar region: Secondary | ICD-10-CM | POA: Diagnosis not present

## 2015-02-28 ENCOUNTER — Telehealth: Payer: Self-pay | Admitting: Adult Health

## 2015-02-28 DIAGNOSIS — S82451D Displaced comminuted fracture of shaft of right fibula, subsequent encounter for closed fracture with routine healing: Secondary | ICD-10-CM | POA: Diagnosis not present

## 2015-02-28 NOTE — Telephone Encounter (Signed)
Patient is calling back about the compound cream and states she has not received a call back from anyone about this.  Thanks!

## 2015-02-28 NOTE — Telephone Encounter (Signed)
Called and spoke to pharmacist Anderson Malta she relayed RX had been shipped I called patient and relayed give it a few more days . Patient understood and she will call back by Thursday if she had not received.

## 2015-02-28 NOTE — Telephone Encounter (Signed)
A prescription was sent to [facility] for compounded cream. Please call the company and make sure they receive the prescription. There number is [phone]

## 2015-03-02 ENCOUNTER — Telehealth: Payer: Self-pay

## 2015-03-02 DIAGNOSIS — M4806 Spinal stenosis, lumbar region: Secondary | ICD-10-CM | POA: Diagnosis not present

## 2015-03-02 NOTE — Telephone Encounter (Signed)
Patient called to question when she needs the second component of pneumonia vaccine. I reviewed record, patient will need the pneumonia 23 on 05/13/15 or after. Patient verbalized understanding

## 2015-03-03 ENCOUNTER — Other Ambulatory Visit: Payer: Self-pay | Admitting: Neurology

## 2015-03-03 ENCOUNTER — Telehealth: Payer: Self-pay

## 2015-03-03 ENCOUNTER — Telehealth: Payer: Self-pay | Admitting: Neurology

## 2015-03-03 MED ORDER — HYDROCODONE-ACETAMINOPHEN 5-325 MG PO TABS: 1.0000 | ORAL_TABLET | Freq: Four times a day (QID) | ORAL | Status: DC | PRN

## 2015-03-03 NOTE — Telephone Encounter (Signed)
Rx ready for pick up. 

## 2015-03-03 NOTE — Telephone Encounter (Signed)
I have called in a prescription for the hydrocodone.

## 2015-03-03 NOTE — Telephone Encounter (Signed)
Pt needs refill on HYDROcodone-acetaminophen (NORCO/VICODIN) 5-325 MG per tablet, thank you

## 2015-03-07 ENCOUNTER — Other Ambulatory Visit: Payer: Medicare Other

## 2015-03-10 ENCOUNTER — Other Ambulatory Visit: Payer: Medicare Other

## 2015-03-10 DIAGNOSIS — S82451D Displaced comminuted fracture of shaft of right fibula, subsequent encounter for closed fracture with routine healing: Secondary | ICD-10-CM | POA: Diagnosis not present

## 2015-03-11 ENCOUNTER — Telehealth: Payer: Self-pay | Admitting: *Deleted

## 2015-03-11 ENCOUNTER — Ambulatory Visit: Payer: Medicare Other | Admitting: Internal Medicine

## 2015-03-11 ENCOUNTER — Other Ambulatory Visit: Payer: Medicare Other

## 2015-03-11 MED ORDER — DOXYCYCLINE HYCLATE 100 MG PO TABS: ORAL_TABLET | ORAL | Status: DC

## 2015-03-11 NOTE — Telephone Encounter (Signed)
Doxycycline 100mg  po bid for 10 days.  She should also take a probiotic twice a day while she's on it.  Encourage hydration.  She should be seen next week.

## 2015-03-11 NOTE — Telephone Encounter (Signed)
Patient notified and agreed. Stated that she has Probiotic's already at home. Patient has an appointment next week

## 2015-03-11 NOTE — Telephone Encounter (Signed)
Patient called and stated that she had a lab appointment scheduled for this morning but unable to come in due to feeling so bad. Patient stated that she is coughing up green/brown mucus, low grade fever and achy. Patient would like to know if you can call something in for her. No available appointments. Please Advise.

## 2015-03-14 ENCOUNTER — Encounter: Payer: Self-pay | Admitting: Internal Medicine

## 2015-03-14 ENCOUNTER — Ambulatory Visit: Payer: Medicare Other | Admitting: Internal Medicine

## 2015-03-21 ENCOUNTER — Ambulatory Visit: Payer: Medicare Other | Admitting: Internal Medicine

## 2015-03-25 ENCOUNTER — Other Ambulatory Visit: Payer: Self-pay | Admitting: Neurological Surgery

## 2015-03-30 ENCOUNTER — Telehealth: Payer: Self-pay

## 2015-03-30 NOTE — Telephone Encounter (Signed)
Called patient to see if she would reschedule her Mammogram appointment got a busy signal will try again later.

## 2015-03-30 NOTE — Telephone Encounter (Signed)
-----   Message from [name], DO sent at 03/29/2015  7:15 PM EDT ----- Looks like [name] missed her mammogram we had scheduled.  Please call to make sure she intends to reschedule this.  ----- Message -----    From: SYSTEM    Sent: 03/29/2015  12:04 AM      To: [name], DO

## 2015-04-01 ENCOUNTER — Telehealth: Payer: Self-pay

## 2015-04-01 NOTE — Telephone Encounter (Signed)
Patient states she was unaware that a mammogram was scheduled for her. Patient has a lot of appointments coming up (one being a major back surgery). Patient will call when she is ready to pursue a mammogram

## 2015-04-01 NOTE — Telephone Encounter (Signed)
-----   Message from [name], DO sent at 03/29/2015  7:15 PM EDT ----- Looks like [name] missed her mammogram we had scheduled.  Please call to make sure she intends to reschedule this.  ----- Message -----    From: SYSTEM    Sent: 03/29/2015  12:04 AM      To: [name], DO

## 2015-04-09 ENCOUNTER — Encounter (HOSPITAL_BASED_OUTPATIENT_CLINIC_OR_DEPARTMENT_OTHER): Payer: Self-pay | Admitting: Emergency Medicine

## 2015-04-09 ENCOUNTER — Emergency Department (HOSPITAL_BASED_OUTPATIENT_CLINIC_OR_DEPARTMENT_OTHER): Payer: Medicare Other

## 2015-04-09 ENCOUNTER — Emergency Department (HOSPITAL_BASED_OUTPATIENT_CLINIC_OR_DEPARTMENT_OTHER)
Admission: EM | Admit: 2015-04-09 | Discharge: 2015-04-09 | Disposition: A | Discharge status: Home / Self Care | Payer: Medicare Other | Attending: Physician Assistant | Admitting: Physician Assistant

## 2015-04-09 DIAGNOSIS — E785 Hyperlipidemia, unspecified: Secondary | ICD-10-CM | POA: Insufficient documentation

## 2015-04-09 DIAGNOSIS — Y9289 Other specified places as the place of occurrence of the external cause: Secondary | ICD-10-CM | POA: Insufficient documentation

## 2015-04-09 DIAGNOSIS — Z7951 Long term (current) use of inhaled steroids: Secondary | ICD-10-CM | POA: Diagnosis not present

## 2015-04-09 DIAGNOSIS — S99921A Unspecified injury of right foot, initial encounter: Secondary | ICD-10-CM | POA: Insufficient documentation

## 2015-04-09 DIAGNOSIS — W010XXA Fall on same level from slipping, tripping and stumbling without subsequent striking against object, initial encounter: Secondary | ICD-10-CM | POA: Diagnosis not present

## 2015-04-09 DIAGNOSIS — M1990 Unspecified osteoarthritis, unspecified site: Secondary | ICD-10-CM | POA: Insufficient documentation

## 2015-04-09 DIAGNOSIS — Z872 Personal history of diseases of the skin and subcutaneous tissue: Secondary | ICD-10-CM | POA: Diagnosis not present

## 2015-04-09 DIAGNOSIS — M79671 Pain in right foot: Secondary | ICD-10-CM

## 2015-04-09 DIAGNOSIS — Z7982 Long term (current) use of aspirin: Secondary | ICD-10-CM | POA: Insufficient documentation

## 2015-04-09 DIAGNOSIS — F329 Major depressive disorder, single episode, unspecified: Secondary | ICD-10-CM | POA: Insufficient documentation

## 2015-04-09 DIAGNOSIS — I499 Cardiac arrhythmia, unspecified: Secondary | ICD-10-CM | POA: Diagnosis not present

## 2015-04-09 DIAGNOSIS — Y9389 Activity, other specified: Secondary | ICD-10-CM | POA: Insufficient documentation

## 2015-04-09 DIAGNOSIS — Z8781 Personal history of (healed) traumatic fracture: Secondary | ICD-10-CM | POA: Insufficient documentation

## 2015-04-09 DIAGNOSIS — Y998 Other external cause status: Secondary | ICD-10-CM | POA: Diagnosis not present

## 2015-04-09 DIAGNOSIS — K219 Gastro-esophageal reflux disease without esophagitis: Secondary | ICD-10-CM | POA: Insufficient documentation

## 2015-04-09 DIAGNOSIS — Z8673 Personal history of transient ischemic attack (TIA), and cerebral infarction without residual deficits: Secondary | ICD-10-CM | POA: Insufficient documentation

## 2015-04-09 DIAGNOSIS — G629 Polyneuropathy, unspecified: Secondary | ICD-10-CM | POA: Diagnosis not present

## 2015-04-09 DIAGNOSIS — Z792 Long term (current) use of antibiotics: Secondary | ICD-10-CM | POA: Diagnosis not present

## 2015-04-09 DIAGNOSIS — I10 Essential (primary) hypertension: Secondary | ICD-10-CM | POA: Insufficient documentation

## 2015-04-09 DIAGNOSIS — G2581 Restless legs syndrome: Secondary | ICD-10-CM | POA: Insufficient documentation

## 2015-04-09 MED ORDER — OXYCODONE-ACETAMINOPHEN 5-325 MG PO TABS
1.0000 | ORAL_TABLET | Freq: Once | ORAL | Status: AC
Start: 2015-04-09 — End: 2015-04-09
  Administered 2015-04-09: 1 via ORAL
  Filled 2015-04-09: qty 1

## 2015-04-09 MED ORDER — OXYCODONE-ACETAMINOPHEN 5-325 MG PO TABS: 1.0000 | ORAL_TABLET | ORAL | Status: DC | PRN

## 2015-04-09 NOTE — ED Notes (Signed)
Patient states that she fell today and is having pain to her right foot

## 2015-04-09 NOTE — ED Provider Notes (Signed)
CSN: [id number]     Arrival date & time 04/09/15  1540 History   First MD Initiated Contact with Patient 04/09/15 1800     Chief Complaint  Patient presents with  . Foot Pain    right     (Consider location/radiation/quality/duration/timing/severity/associated sxs/prior Treatment) Patient is a 74 y.o. female presenting with lower extremity pain. The history is provided by the patient and medical records.  Foot Pain Associated symptoms include arthralgias.    This is a 74 year old female with history of hypertension, GERD, arthritis, restless leg syndrome, hyperlipidemia, presenting to the ED for fall.  Patient states she got up in the middle of the night to go to the bathroom when she lost her footing and twisted her right foot. She denies any fall to the ground, head injury, loss of consciousness. She states today she continues to have pain in her dorsal right foot and arch of right foot.  She denies numbness or weakness of her right foot. She denies any pain of right ankle, right knee, or right hip. Patient has been using her wheelchair at home as she is unable to bear weight on her right foot.  VSS.  Past Medical History  Diagnosis Date  . Hypertension   . GERD (gastroesophageal reflux disease)     hx pud '98  . Arthritis     djd  . Restless leg syndrome   . 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 Dysrhythmia     hx tachy palpitations  . Headache(784.0)     remote hx of migraines  . Neuromuscular disorder ([place])     peripheral neuropathy, FEET AND LEGS  . Depression     bh admission '09  . Stroke [facility])     ? 6 months ago - tests okay - [facility]   . Cellulitis     10/11/11 hospitalized for cellulitis   . Hyperlipemia   . Peripheral neuropathy ([place])   . Elbow fracture, left April 2015   Past Surgical History  Procedure Laterality Date  . Cervical disc surgery x2  2011  . Rt carotid enarterectomy  2011  . Rt knee arthroscopy  '99  . Left knee arthroscopy  2001  .  Hematoma evacuation  2011    s/p rt cea  . Joint replacement  '01    total knee replacement, LEFT  . Abdominal hysterectomy  1975    bil oophorectomy  . Back surgery   MANY YRS AGO    laminectomy x3  . Cholecystectomy  1993  . Total knee arthroplasty  09/13/2011    Procedure: TOTAL KNEE ARTHROPLASTY;  Surgeon: Magnus Sinning, MD;  Location: [facility];  Service: Orthopedics;  Laterality: Right;  . Right knee replacement      09/2011   . Carpal tunnel release  07/09/2012    Procedure: CARPAL TUNNEL RELEASE;  Surgeon: Magnus Sinning, MD;  Location: [facility];  Service: Orthopedics;  Laterality: Left;  . Finger arthroplasty  07/09/2012    Procedure: FINGER ARTHROPLASTY;  Surgeon: Magnus Sinning, MD;  Location: [facility];  Service: Orthopedics;  Laterality: Left;  Interposition Arthroplasty [facility] Joint Thumb Left   . Lipoma excision  07/09/2012    Procedure: EXCISION LIPOMA;  Surgeon: Magnus Sinning, MD;  Location: [facility];  Service: Orthopedics;  Laterality: Left;  Excision Lipoma Dorsum Left Wrist   . Orif ankle fracture Right 09/14/2014    FIBULA   . Orif ankle  fracture Right 09/14/2014    Procedure: OPEN REDUCTION INTERNAL FIXATION (ORIF) RIGHT ANKLE FRACTURE/SYNDESMOSIS ;  Surgeon: Wylene Simmer, MD;  Location: [place];  Service: Orthopedics;  Laterality: Right;   Family History  Problem Relation Age of Onset  . Congestive Heart Failure Father   . Congestive Heart Failure Sister   . Alzheimer's disease Mother   . Diabetes Brother   . Arthritis Brother    Social History  Substance Use Topics  . Smoking status: Never Smoker   . Smokeless tobacco: Never Used  . Alcohol Use: No   OB History    No data available     Review of Systems  Musculoskeletal: Positive for arthralgias.  All other systems reviewed and are negative.     Allergies  Contrast media and Sulfa drugs cross reactors  Home Medications   Prior to Admission medications   Medication Sig Start Date End Date Taking?  Authorizing Provider  acetaminophen (TYLENOL) 325 MG tablet Take 2 tablets (650 mg total) by mouth every 6 (six) hours as needed for mild pain (or Fever >/= 101). 09/16/14   Wylene Simmer, MD  amoxicillin (AMOXIL) 500 MG capsule Take 2,000 mg by mouth See admin instructions. Must take 4 capsules 1 hour before dental procedures    Historical Provider, MD  aspirin 325 MG tablet Take 325 mg by mouth daily.    Historical Provider, MD  atorvastatin (LIPITOR) 40 MG tablet Take one tablet by mouth once daily for cholesterol 01/31/15   [name] L [name], DO  calcium carbonate 1250 MG capsule Take 1,250 mg by mouth daily.     Historical Provider, MD  carbidopa-levodopa (SINEMET CR) 50-200 MG per tablet Take 1 tablet by mouth at bedtime. 09/02/14   [name] L [name], DO  carbidopa-levodopa (SINEMET IR) 25-250 MG per tablet Take one tablet by mouth three times daily with meals 09/16/14   Wylene Simmer, MD  cholecalciferol (VITAMIN D) 1000 UNITS tablet Take 1,000 Units by mouth daily.    Historical Provider, MD  doxycycline (VIBRA-TABS) 100 MG tablet Take one tablet by mouth twice daily for infection 03/11/15   [name] L [name], DO  DULoxetine (CYMBALTA) 30 MG capsule Take one tablet by mouth twice daily for ONE week; then take one tablet once daily for ONE week 02/09/15   [name] L [name], DO  fluticasone (FLONASE) 50 MCG/ACT nasal spray SHAKE WELL AND USE 1 SPRAY IN EACH NOSTRIL DAILY AS NEEDED FOR ALLERGIES OR RHINITIS 11/24/14   [name] L [name], DO  gabapentin (NEURONTIN) 600 MG tablet Take 1 tablet (600 mg total) by mouth 4 (four) times daily. 01/06/15   Ward Givens, NP  hydrochlorothiazide (HYDRODIURIL) 25 MG tablet Take 1 tablet by mouth in  the morning 12/24/14   [name] L [name], DO  HYDROcodone-acetaminophen (NORCO/VICODIN) 5-325 MG tablet Take 1 tablet by mouth every 6 (six) hours as needed (Must last 28 days). 03/03/15   Kathrynn Ducking, MD  LORazepam (ATIVAN) 1 MG tablet Take 1 tablet (1 mg total) by mouth every 8 (eight)  hours as needed for anxiety. 01/31/15   [name] L [name], DO  meclizine (ANTIVERT) 25 MG tablet Take one tablet by mouth once daily as needed for dizziness. 05/26/14   [name] L [name], DO  nystatin (MYCOSTATIN/NYSTOP) 100000 UNIT/GM POWD Apply beneath left breast for yeast rash 01/11/14   [name] L [name], DO  Phenylephrine HCl (AFRIN ALLERGY NA) Place 2 sprays into the nose daily as needed (Nasal congestion).    Historical Provider,  MD  potassium chloride SA (K-DUR,KLOR-CON) 20 MEQ tablet Take two tablet daily for potassium Patient taking differently: 40 mEq. Take two tablet daily for potassium 01/04/15   [name] L [name], DO  pyridOXINE (VITAMIN B-6) 50 MG tablet Take 50 mg by mouth daily.    Historical Provider, MD  sertraline (ZOLOFT) 50 MG tablet Take 1/2 (25mg ) tablet by mouth for one week, then take one tablet by mouth for one week then Take two tablets by mouth once daily 02/09/15   [name] L [name], DO  sertraline (ZOLOFT) 50 MG tablet Take two tablets by mouth once daily for depression 02/09/15   [name] L [name], DO  SUMAtriptan (IMITREX) 50 MG tablet See AUX Label 11/24/14   [name] L [name], DO  temazepam (RESTORIL) 7.5 MG capsule Take 1 capsule (7.5 mg total) by mouth at bedtime as needed for sleep. 12/27/14   [name] L [name], DO   BP 128/78 mmHg  Pulse 94  Temp(Src) 98.2 F (36.8 C) (Oral)  Resp 16  Ht 5\' 5"  (1.651 m)  Wt 203 lb (92.08 kg)  BMI 33.78 kg/m2  SpO2 100%   Physical Exam  Constitutional: [name] oriented to person, place, and time. She appears well-developed and well-nourished. No distress.  HENT:  Head: Normocephalic and atraumatic.  Mouth/Throat: Oropharynx is clear and moist.  Eyes: Conjunctivae and EOM are normal. Pupils are equal, round, and reactive to light.  Neck: Normal range of motion. Neck supple.  Cardiovascular: Normal rate, regular rhythm and normal heart sounds.   Pulmonary/Chest: Effort normal and breath sounds normal. No respiratory distress. She has no wheezes.    Abdominal: Soft. Bowel sounds are normal.  Musculoskeletal:       Right ankle: She exhibits decreased range of motion. She exhibits no swelling, no ecchymosis, no deformity, no laceration and normal pulse. Tenderness. Achilles tendon normal.       Feet:  Right foot with TTP to dorsal and arch of right foot; limited ROM due to pain; right ankle non-tender without deformity; DP pulse intact; moving all toes appropriately; normal sensation throughout right foot; no overlying skin changes  Neurological: [name] alert and oriented to person, place, and time.  Skin: Skin is warm and dry. [name] not diaphoretic.  Psychiatric: She has a normal mood and affect.  Nursing note and vitals reviewed.   ED Course  Procedures (including critical care time) Labs Review Labs Reviewed - No data to display  Imaging Review Dg Foot Complete Right  04/09/2015  CLINICAL DATA:  74 year old female with history of trauma from a fall complaining of right foot pain anteriorly. EXAM: RIGHT FOOT COMPLETE - 3+ VIEW COMPARISON:  Right ankle radiograph 08/21/2014. FINDINGS: Three views of the right foot demonstrate no acute displaced fracture, subluxation or dislocation. Posttraumatic deformity of the distal fibula with a lateral plate and screw fixation device in place, and some heterotopic ossification adjacent to the tibiotalar joint. Mild diffuse soft tissue swelling. IMPRESSION: 1. No acute radiographic abnormality of the right foot. Electronically Signed   By: Vinnie Langton M.D.   On: 04/09/2015 16:55   I have personally reviewed and evaluated these images and lab results as part of my medical decision-making.   EKG Interpretation None      MDM   Final diagnoses:  Foot pain, right   74 year old female here with right foot pain after falling last night. No deformities noted on exam. Endorses pain to dorsal aspect and arch of right foot. Foot is neurovascularly  intact. Right ankle is normal without any pain  or tenderness. X-ray right foot was obtained which is negative for acute findings. Ace wrap was applied for comfort. Patient normally takes tramadol for pain, however took last tablet this morning and still has not had any relief. Will prescribe short supply of pain medication.  Continue using wheel chair at home until comfortable bearing weight on right foot. Patient to follow-up with her PCP.  Discussed plan with patient, he/she acknowledged understanding and agreed with plan of care.  Return precautions given for new or worsening symptoms.  Larene Pickett, PA-C 04/09/15 [place], MD 04/10/15 [phone]

## 2015-04-09 NOTE — Discharge Instructions (Signed)
Take the prescribed medication as directed.  Use caution, this medication may make you drowsy. Recommended to ice and elevate foot at home to help with pain and/or swelling. Follow-up with your primary care physician. Return to the ED for new or worsening symptoms.

## 2015-04-10 DIAGNOSIS — S82451D Displaced comminuted fracture of shaft of right fibula, subsequent encounter for closed fracture with routine healing: Secondary | ICD-10-CM | POA: Diagnosis not present

## 2015-04-11 ENCOUNTER — Ambulatory Visit: Payer: Medicare Other | Admitting: Adult Health

## 2015-04-13 ENCOUNTER — Other Ambulatory Visit: Payer: Self-pay | Admitting: Internal Medicine

## 2015-04-13 ENCOUNTER — Other Ambulatory Visit: Payer: Self-pay

## 2015-04-13 ENCOUNTER — Other Ambulatory Visit: Payer: Self-pay | Admitting: Adult Health

## 2015-04-13 MED ORDER — TEMAZEPAM 7.5 MG PO CAPS: 7.5000 mg | ORAL_CAPSULE | Freq: Every evening | ORAL | Status: DC | PRN

## 2015-04-13 NOTE — Telephone Encounter (Signed)
RX manually faxed  

## 2015-04-14 ENCOUNTER — Ambulatory Visit: Payer: Medicare Other | Admitting: Internal Medicine

## 2015-04-14 ENCOUNTER — Ambulatory Visit: Payer: Medicare Other | Admitting: Adult Health

## 2015-04-15 ENCOUNTER — Encounter (HOSPITAL_COMMUNITY): Payer: Self-pay | Admitting: *Deleted

## 2015-04-18 ENCOUNTER — Ambulatory Visit: Payer: Medicare Other | Admitting: Adult Health

## 2015-04-18 MED ORDER — CEFAZOLIN SODIUM-DEXTROSE 2-3 GM-% IV SOLR
2.0000 g | INTRAVENOUS | Status: AC
  Administered 2015-04-19: 2 g via INTRAVENOUS
  Filled 2015-04-18: qty 50

## 2015-04-18 NOTE — Progress Notes (Signed)
Anesthesia Chart Review: SAME DAY WORK-UP.  Patient is a 74 year old female scheduled for bilateral L2-3 laminectomy and foraminotomy on 04/19/15 by Dr. Ellene Route.  History includes never smoker, GERD, RLS, +PPD (treated X 1 year '87), tachy-palpitiations, peripheral neuropathy, HLD, depression, HTN, migraines, anxiety, chronic back pain, vertigo, right CEA '11, cholecystectomy, hysterectomy, ORIF right ankle fracture 09/23/14, right TKA '13, C6-7 ACDF '11. Seen for possible CVA symptoms in 10/2011, but MRI showed no evidence of CVA. BMI is consistent with obesity. PCP is listed as Dr. Hollace Kinnier, last CPE exam 11/08/14 with routine EKG. Neurologist is Dr. Lanny Hurst Lucio Edward, NP.  Meds include amoxicillin, Lipitor, Sinemet, Neurontin, HCTZ, Norco, Ativan, Afrin Allergy, Antivert, KCl, Zoloft, Imitrex, Restoril.   11/11/14 EKG (Dr. Mariea Clonts): ST 101 bpm, ST/T abnormality, consider anterolateral ischemia or left ventricular strain, inferolateral ischemia or left ventricular strain. T wave abnormality in anterior leads. Dr. Mariea Clonts felt EKG was unchanged from previous.  There are multiple EKGs in Epic and Muse dating back to 2000. She has had anterior, lateral, inferior T wave inversion dating back to at least 08/07/07. Anterolateral T wave abnormality also present on 12/14/05 tracing but inferior leads more non-specific then.  Has undergone right CEA, right TKA,  ACDF, CTR, and ORIF right ankle since then.    10/12/11 Echo: Study Conclusions: Left ventricle: Wall thickness was increased in a pattern of mild LVH.  Systolic function was normal. The estimated ejection fraction was in the range of 60% to 65%. There was an increased relative contribution of atrial contraction to ventricular filling.   10/12/11 Carotid duplex: No significant extracranial carotid artery stenosis demonstrated. Vertebrals are patent with antegrade flow.  12/14/05 Nuclear stress test: IMPRESSION:No evidence of ischemia or infarct.  Ejection fraction 89 %.  She will need labs on arrival.   Her EKG is abnormal, but appears stable for several years. If she remains asymptomatic from a CV standpoint and labs are acceptable then I would anticipate that she could proceed as planned.  George Hugh [facility] Short Stay Center/Anesthesiology Phone [phone] 04/18/2015 1:33 PM

## 2015-04-19 ENCOUNTER — Ambulatory Visit (HOSPITAL_COMMUNITY): Payer: Medicare Other

## 2015-04-19 ENCOUNTER — Inpatient Hospital Stay (HOSPITAL_COMMUNITY)
Admission: AD | Admit: 2015-04-19 | Discharge: 2015-04-21 | DRG: 520 | Disposition: A | Discharge status: Skilled Nursing Facility | Payer: Medicare Other | Source: Ambulatory Visit | Attending: Neurological Surgery | Admitting: Neurological Surgery

## 2015-04-19 ENCOUNTER — Encounter (HOSPITAL_COMMUNITY): Payer: Self-pay | Admitting: Anesthesiology

## 2015-04-19 ENCOUNTER — Ambulatory Visit (HOSPITAL_COMMUNITY): Payer: Medicare Other | Admitting: Vascular Surgery

## 2015-04-19 ENCOUNTER — Encounter: Payer: Self-pay | Admitting: Adult Health

## 2015-04-19 ENCOUNTER — Encounter (HOSPITAL_COMMUNITY)
Admission: AD | Disposition: A | Discharge status: Skilled Nursing Facility | Payer: Self-pay | Source: Ambulatory Visit | Attending: Neurological Surgery

## 2015-04-19 DIAGNOSIS — Z22322 Carrier or suspected carrier of Methicillin resistant Staphylococcus aureus: Secondary | ICD-10-CM | POA: Diagnosis not present

## 2015-04-19 DIAGNOSIS — G2581 Restless legs syndrome: Secondary | ICD-10-CM | POA: Diagnosis present

## 2015-04-19 DIAGNOSIS — R296 Repeated falls: Secondary | ICD-10-CM | POA: Diagnosis present

## 2015-04-19 DIAGNOSIS — F419 Anxiety disorder, unspecified: Secondary | ICD-10-CM | POA: Diagnosis present

## 2015-04-19 DIAGNOSIS — Z419 Encounter for procedure for purposes other than remedying health state, unspecified: Secondary | ICD-10-CM

## 2015-04-19 DIAGNOSIS — G20 Parkinson's disease: Secondary | ICD-10-CM | POA: Diagnosis not present

## 2015-04-19 DIAGNOSIS — Z4789 Encounter for other orthopedic aftercare: Secondary | ICD-10-CM | POA: Diagnosis not present

## 2015-04-19 DIAGNOSIS — K219 Gastro-esophageal reflux disease without esophagitis: Secondary | ICD-10-CM | POA: Diagnosis present

## 2015-04-19 DIAGNOSIS — M47816 Spondylosis without myelopathy or radiculopathy, lumbar region: Secondary | ICD-10-CM | POA: Diagnosis not present

## 2015-04-19 DIAGNOSIS — M4806 Spinal stenosis, lumbar region: Secondary | ICD-10-CM | POA: Diagnosis not present

## 2015-04-19 DIAGNOSIS — F329 Major depressive disorder, single episode, unspecified: Secondary | ICD-10-CM | POA: Diagnosis present

## 2015-04-19 DIAGNOSIS — Z96651 Presence of right artificial knee joint: Secondary | ICD-10-CM | POA: Diagnosis not present

## 2015-04-19 DIAGNOSIS — E785 Hyperlipidemia, unspecified: Secondary | ICD-10-CM | POA: Diagnosis present

## 2015-04-19 DIAGNOSIS — Z9889 Other specified postprocedural states: Secondary | ICD-10-CM | POA: Diagnosis not present

## 2015-04-19 DIAGNOSIS — M62838 Other muscle spasm: Secondary | ICD-10-CM | POA: Diagnosis not present

## 2015-04-19 DIAGNOSIS — G629 Polyneuropathy, unspecified: Secondary | ICD-10-CM | POA: Diagnosis not present

## 2015-04-19 DIAGNOSIS — G43909 Migraine, unspecified, not intractable, without status migrainosus: Secondary | ICD-10-CM | POA: Diagnosis not present

## 2015-04-19 DIAGNOSIS — R278 Other lack of coordination: Secondary | ICD-10-CM | POA: Diagnosis not present

## 2015-04-19 DIAGNOSIS — Z79899 Other long term (current) drug therapy: Secondary | ICD-10-CM

## 2015-04-19 DIAGNOSIS — G43919 Migraine, unspecified, intractable, without status migrainosus: Secondary | ICD-10-CM | POA: Diagnosis not present

## 2015-04-19 DIAGNOSIS — M6281 Muscle weakness (generalized): Secondary | ICD-10-CM | POA: Diagnosis not present

## 2015-04-19 DIAGNOSIS — Z91041 Radiographic dye allergy status: Secondary | ICD-10-CM

## 2015-04-19 DIAGNOSIS — G4700 Insomnia, unspecified: Secondary | ICD-10-CM | POA: Diagnosis present

## 2015-04-19 DIAGNOSIS — I10 Essential (primary) hypertension: Secondary | ICD-10-CM | POA: Diagnosis not present

## 2015-04-19 DIAGNOSIS — M62830 Muscle spasm of back: Secondary | ICD-10-CM | POA: Diagnosis not present

## 2015-04-19 DIAGNOSIS — M5126 Other intervertebral disc displacement, lumbar region: Secondary | ICD-10-CM | POA: Diagnosis not present

## 2015-04-19 DIAGNOSIS — E876 Hypokalemia: Secondary | ICD-10-CM | POA: Diagnosis not present

## 2015-04-19 DIAGNOSIS — G4733 Obstructive sleep apnea (adult) (pediatric): Secondary | ICD-10-CM | POA: Diagnosis not present

## 2015-04-19 DIAGNOSIS — Z79891 Long term (current) use of opiate analgesic: Secondary | ICD-10-CM | POA: Diagnosis not present

## 2015-04-19 DIAGNOSIS — Z7982 Long term (current) use of aspirin: Secondary | ICD-10-CM

## 2015-04-19 DIAGNOSIS — M79606 Pain in leg, unspecified: Secondary | ICD-10-CM | POA: Diagnosis present

## 2015-04-19 DIAGNOSIS — Z882 Allergy status to sulfonamides status: Secondary | ICD-10-CM | POA: Diagnosis not present

## 2015-04-19 DIAGNOSIS — I739 Peripheral vascular disease, unspecified: Secondary | ICD-10-CM | POA: Diagnosis not present

## 2015-04-19 DIAGNOSIS — M48061 Spinal stenosis, lumbar region without neurogenic claudication: Secondary | ICD-10-CM | POA: Diagnosis present

## 2015-04-19 DIAGNOSIS — R2681 Unsteadiness on feet: Secondary | ICD-10-CM | POA: Diagnosis not present

## 2015-04-19 HISTORY — DX: Unspecified cataract: H26.9

## 2015-04-19 HISTORY — DX: Dizziness and giddiness: R42

## 2015-04-19 HISTORY — PX: LUMBAR LAMINECTOMY WITH COFLEX 1 LEVEL: SHX6514

## 2015-04-19 HISTORY — DX: Anxiety disorder, unspecified: F41.9

## 2015-04-19 HISTORY — DX: Insomnia, unspecified: G47.00

## 2015-04-19 HISTORY — DX: Dorsalgia, unspecified: M54.9

## 2015-04-19 HISTORY — DX: Other chronic pain: G89.29

## 2015-04-19 LAB — CBC
HCT: 40.4 % (ref 36.0–46.0)
HEMOGLOBIN: 12.6 g/dL (ref 12.0–15.0)
MCH: 30.7 pg (ref 26.0–34.0)
MCHC: 31.2 g/dL (ref 30.0–36.0)
MCV: 98.3 fL (ref 78.0–100.0)
PLATELETS: 287 10*3/uL (ref 150–400)
RBC: 4.11 MIL/uL (ref 3.87–5.11)
RDW: 13 % (ref 11.5–15.5)
WBC: 9.9 10*3/uL (ref 4.0–10.5)

## 2015-04-19 LAB — BASIC METABOLIC PANEL
Anion gap: 9 (ref 5–15)
BUN: 11 mg/dL (ref 6–20)
CALCIUM: 9.3 mg/dL (ref 8.9–10.3)
CHLORIDE: 101 mmol/L (ref 101–111)
CO2: 27 mmol/L (ref 22–32)
CREATININE: 1.07 mg/dL — AB (ref 0.44–1.00)
GFR calc non Af Amer: 50 mL/min — ABNORMAL LOW (ref >60)
GFR, EST AFRICAN AMERICAN: 58 mL/min — AB (ref >60)
Glucose, Bld: 110 mg/dL — ABNORMAL HIGH (ref 65–99)
Potassium: 3.9 mmol/L (ref 3.5–5.1)
SODIUM: 137 mmol/L (ref 135–145)

## 2015-04-19 LAB — SURGICAL PCR SCREEN
MRSA, PCR: NEGATIVE
STAPHYLOCOCCUS AUREUS: POSITIVE — AB

## 2015-04-19 SURGERY — LUMBAR LAMINECTOMY WITH COFLEX 1 LEVEL
Anesthesia: General | Site: Spine Lumbar | Laterality: Bilateral

## 2015-04-19 MED ORDER — DEXAMETHASONE SODIUM PHOSPHATE 10 MG/ML IJ SOLN
INTRAMUSCULAR | Status: DC | PRN
  Administered 2015-04-19: 10 mg via INTRAVENOUS

## 2015-04-19 MED ORDER — GABAPENTIN 600 MG PO TABS
600.0000 mg | ORAL_TABLET | Freq: Three times a day (TID) | ORAL | Status: DC
  Administered 2015-04-19 – 2015-04-21 (×5): 600 mg via ORAL
  Filled 2015-04-19 (×5): qty 1

## 2015-04-19 MED ORDER — PROPOFOL 10 MG/ML IV BOLUS
INTRAVENOUS | Status: DC | PRN
  Administered 2015-04-19: 180 mg via INTRAVENOUS

## 2015-04-19 MED ORDER — POLYETHYLENE GLYCOL 3350 17 G PO PACK: 17.0000 g | PACK | Freq: Every day | ORAL | Status: DC | PRN

## 2015-04-19 MED ORDER — ALUM & MAG HYDROXIDE-SIMETH 200-200-20 MG/5ML PO SUSP: 30.0000 mL | Freq: Four times a day (QID) | ORAL | Status: DC | PRN

## 2015-04-19 MED ORDER — FENTANYL CITRATE (PF) 100 MCG/2ML IJ SOLN
INTRAMUSCULAR | Status: DC | PRN
  Administered 2015-04-19: 100 ug via INTRAVENOUS
  Administered 2015-04-19: 50 ug via INTRAVENOUS

## 2015-04-19 MED ORDER — METHOCARBAMOL 500 MG PO TABS
ORAL_TABLET | ORAL | Status: AC
  Filled 2015-04-19: qty 1

## 2015-04-19 MED ORDER — ONDANSETRON HCL 4 MG/2ML IJ SOLN: 4.0000 mg | Freq: Once | INTRAMUSCULAR | Status: DC | PRN

## 2015-04-19 MED ORDER — TEMAZEPAM 7.5 MG PO CAPS
7.5000 mg | ORAL_CAPSULE | Freq: Every evening | ORAL | Status: DC | PRN
  Administered 2015-04-20: 7.5 mg via ORAL
  Filled 2015-04-19: qty 1

## 2015-04-19 MED ORDER — ACETAMINOPHEN 325 MG PO TABS: 650.0000 mg | ORAL_TABLET | ORAL | Status: DC | PRN

## 2015-04-19 MED ORDER — NEOSTIGMINE METHYLSULFATE 10 MG/10ML IV SOLN
INTRAVENOUS | Status: DC | PRN
  Administered 2015-04-19: 5 mg via INTRAVENOUS

## 2015-04-19 MED ORDER — DEXTROSE 5 % IV SOLN
10.0000 mg | INTRAVENOUS | Status: DC | PRN
  Administered 2015-04-19: 25 ug/min via INTRAVENOUS

## 2015-04-19 MED ORDER — CEFAZOLIN SODIUM 1-5 GM-% IV SOLN
1.0000 g | Freq: Three times a day (TID) | INTRAVENOUS | Status: AC
  Administered 2015-04-19 – 2015-04-20 (×2): 1 g via INTRAVENOUS
  Filled 2015-04-19 (×2): qty 50

## 2015-04-19 MED ORDER — ONDANSETRON HCL 4 MG/2ML IJ SOLN
INTRAMUSCULAR | Status: AC
  Filled 2015-04-19: qty 2

## 2015-04-19 MED ORDER — LIDOCAINE HCL (CARDIAC) 20 MG/ML IV SOLN
INTRAVENOUS | Status: DC | PRN
  Administered 2015-04-19: 100 mg via INTRATRACHEAL
  Administered 2015-04-19: 100 mg via INTRAVENOUS

## 2015-04-19 MED ORDER — LIDOCAINE HCL (CARDIAC) 20 MG/ML IV SOLN
INTRAVENOUS | Status: AC
  Filled 2015-04-19: qty 5

## 2015-04-19 MED ORDER — GLYCOPYRROLATE 0.2 MG/ML IJ SOLN
INTRAMUSCULAR | Status: DC | PRN
  Administered 2015-04-19: 0.6 mg via INTRAVENOUS

## 2015-04-19 MED ORDER — MENTHOL 3 MG MT LOZG: 1.0000 | LOZENGE | OROMUCOSAL | Status: DC | PRN

## 2015-04-19 MED ORDER — OXYCODONE HCL 5 MG PO TABS: 5.0000 mg | ORAL_TABLET | Freq: Once | ORAL | Status: DC | PRN

## 2015-04-19 MED ORDER — ATORVASTATIN CALCIUM 20 MG PO TABS
40.0000 mg | ORAL_TABLET | Freq: Every day | ORAL | Status: DC
  Administered 2015-04-19 – 2015-04-20 (×2): 40 mg via ORAL
  Filled 2015-04-19 (×2): qty 2

## 2015-04-19 MED ORDER — CARBIDOPA-LEVODOPA ER 50-200 MG PO TBCR
1.0000 | EXTENDED_RELEASE_TABLET | Freq: Every day | ORAL | Status: DC
  Administered 2015-04-19: 1 via ORAL
  Filled 2015-04-19 (×2): qty 1

## 2015-04-19 MED ORDER — HYDROCODONE-ACETAMINOPHEN 5-325 MG PO TABS: 1.0000 | ORAL_TABLET | Freq: Four times a day (QID) | ORAL | Status: DC | PRN

## 2015-04-19 MED ORDER — ROCURONIUM BROMIDE 50 MG/5ML IV SOLN
INTRAVENOUS | Status: AC
  Filled 2015-04-19: qty 1

## 2015-04-19 MED ORDER — PHENOL 1.4 % MT LIQD: 1.0000 | OROMUCOSAL | Status: DC | PRN

## 2015-04-19 MED ORDER — BISACODYL 10 MG RE SUPP: 10.0000 mg | Freq: Every day | RECTAL | Status: DC | PRN

## 2015-04-19 MED ORDER — GLYCOPYRROLATE 0.2 MG/ML IJ SOLN
INTRAMUSCULAR | Status: AC
  Filled 2015-04-19: qty 3

## 2015-04-19 MED ORDER — POTASSIUM CHLORIDE CRYS ER 20 MEQ PO TBCR
40.0000 meq | EXTENDED_RELEASE_TABLET | Freq: Every day | ORAL | Status: DC
  Administered 2015-04-20 – 2015-04-21 (×2): 40 meq via ORAL
  Filled 2015-04-19 (×2): qty 2

## 2015-04-19 MED ORDER — KETOROLAC TROMETHAMINE 15 MG/ML IJ SOLN
15.0000 mg | Freq: Four times a day (QID) | INTRAMUSCULAR | Status: AC
  Administered 2015-04-19 – 2015-04-20 (×5): 15 mg via INTRAVENOUS
  Filled 2015-04-19 (×4): qty 1

## 2015-04-19 MED ORDER — OXYCODONE HCL 5 MG/5ML PO SOLN: 5.0000 mg | Freq: Once | ORAL | Status: DC | PRN

## 2015-04-19 MED ORDER — THROMBIN 5000 UNITS EX SOLR
CUTANEOUS | Status: DC | PRN
  Administered 2015-04-19 (×2): 5000 [IU] via TOPICAL

## 2015-04-19 MED ORDER — OXYCODONE-ACETAMINOPHEN 5-325 MG PO TABS
ORAL_TABLET | ORAL | Status: AC
  Filled 2015-04-19: qty 2

## 2015-04-19 MED ORDER — HYDROMORPHONE HCL 1 MG/ML IJ SOLN
0.2500 mg | INTRAMUSCULAR | Status: DC | PRN
  Administered 2015-04-19 (×3): 0.5 mg via INTRAVENOUS

## 2015-04-19 MED ORDER — HYDROMORPHONE HCL 1 MG/ML IJ SOLN
0.5000 mg | INTRAMUSCULAR | Status: DC | PRN
Start: 2015-04-19 — End: 2015-04-21
  Administered 2015-04-19 – 2015-04-20 (×2): 1 mg via INTRAVENOUS
  Filled 2015-04-19 (×2): qty 1

## 2015-04-19 MED ORDER — HYDROCHLOROTHIAZIDE 25 MG PO TABS
25.0000 mg | ORAL_TABLET | Freq: Every day | ORAL | Status: DC
  Administered 2015-04-20 – 2015-04-21 (×2): 25 mg via ORAL
  Filled 2015-04-19 (×2): qty 1

## 2015-04-19 MED ORDER — MUPIROCIN 2 % EX OINT
1.0000 "application " | TOPICAL_OINTMENT | Freq: Once | CUTANEOUS | Status: AC
  Administered 2015-04-19: 1 via TOPICAL
  Filled 2015-04-19: qty 22

## 2015-04-19 MED ORDER — SODIUM CHLORIDE 0.9 % IR SOLN
Status: DC | PRN
  Administered 2015-04-19: 16:00:00

## 2015-04-19 MED ORDER — SERTRALINE HCL 50 MG PO TABS
100.0000 mg | ORAL_TABLET | Freq: Every day | ORAL | Status: DC
  Administered 2015-04-20 – 2015-04-21 (×2): 100 mg via ORAL
  Filled 2015-04-19 (×2): qty 2

## 2015-04-19 MED ORDER — 0.9 % SODIUM CHLORIDE (POUR BTL) OPTIME
TOPICAL | Status: DC | PRN
  Administered 2015-04-19: 1000 mL

## 2015-04-19 MED ORDER — METHOCARBAMOL 1000 MG/10ML IJ SOLN
500.0000 mg | Freq: Four times a day (QID) | INTRAVENOUS | Status: DC | PRN
  Filled 2015-04-19: qty 5

## 2015-04-19 MED ORDER — PHENYLEPHRINE HCL 10 MG/ML IJ SOLN
INTRAMUSCULAR | Status: DC | PRN
  Administered 2015-04-19: 40 ug via INTRAVENOUS
  Administered 2015-04-19: 80 ug via INTRAVENOUS
  Administered 2015-04-19 (×2): 40 ug via INTRAVENOUS
  Administered 2015-04-19: 80 ug via INTRAVENOUS

## 2015-04-19 MED ORDER — FENTANYL CITRATE (PF) 100 MCG/2ML IJ SOLN
25.0000 ug | Freq: Once | INTRAMUSCULAR | Status: AC
  Administered 2015-04-19: 25 ug via INTRAVENOUS

## 2015-04-19 MED ORDER — LORAZEPAM 0.5 MG PO TABS
1.0000 mg | ORAL_TABLET | Freq: Three times a day (TID) | ORAL | Status: DC | PRN
  Administered 2015-04-20: 1 mg via ORAL
  Filled 2015-04-19 (×2): qty 2

## 2015-04-19 MED ORDER — FENTANYL CITRATE (PF) 100 MCG/2ML IJ SOLN
INTRAMUSCULAR | Status: AC
  Filled 2015-04-19: qty 2

## 2015-04-19 MED ORDER — SODIUM CHLORIDE 0.9 % IJ SOLN
3.0000 mL | Freq: Two times a day (BID) | INTRAMUSCULAR | Status: DC
  Administered 2015-04-20: 3 mL via INTRAVENOUS

## 2015-04-19 MED ORDER — LIDOCAINE-EPINEPHRINE 1 %-1:100000 IJ SOLN
INTRAMUSCULAR | Status: DC | PRN
  Administered 2015-04-19: 3.5 mL

## 2015-04-19 MED ORDER — PROPOFOL 10 MG/ML IV BOLUS
INTRAVENOUS | Status: AC
  Filled 2015-04-19: qty 20

## 2015-04-19 MED ORDER — METHOCARBAMOL 500 MG PO TABS
500.0000 mg | ORAL_TABLET | Freq: Four times a day (QID) | ORAL | Status: DC | PRN
  Administered 2015-04-19: 500 mg via ORAL
  Filled 2015-04-19: qty 1

## 2015-04-19 MED ORDER — DEXAMETHASONE SODIUM PHOSPHATE 10 MG/ML IJ SOLN
INTRAMUSCULAR | Status: AC
  Filled 2015-04-19: qty 1

## 2015-04-19 MED ORDER — ROCURONIUM BROMIDE 100 MG/10ML IV SOLN
INTRAVENOUS | Status: DC | PRN
  Administered 2015-04-19: 35 mg via INTRAVENOUS

## 2015-04-19 MED ORDER — OXYCODONE-ACETAMINOPHEN 5-325 MG PO TABS
1.0000 | ORAL_TABLET | ORAL | Status: DC | PRN
  Administered 2015-04-19 – 2015-04-21 (×5): 2 via ORAL
  Filled 2015-04-19 (×4): qty 2

## 2015-04-19 MED ORDER — BUPIVACAINE HCL (PF) 0.5 % IJ SOLN
INTRAMUSCULAR | Status: DC | PRN
  Administered 2015-04-19: 3.5 mL
  Administered 2015-04-19: 20 mL

## 2015-04-19 MED ORDER — KETOROLAC TROMETHAMINE 15 MG/ML IJ SOLN
INTRAMUSCULAR | Status: AC
  Filled 2015-04-19: qty 1

## 2015-04-19 MED ORDER — SODIUM CHLORIDE 0.9 % IJ SOLN: 3.0000 mL | INTRAMUSCULAR | Status: DC | PRN

## 2015-04-19 MED ORDER — SENNA 8.6 MG PO TABS
1.0000 | ORAL_TABLET | Freq: Two times a day (BID) | ORAL | Status: DC
  Administered 2015-04-19 – 2015-04-21 (×4): 8.6 mg via ORAL
  Filled 2015-04-19 (×4): qty 1

## 2015-04-19 MED ORDER — ONDANSETRON HCL 4 MG/2ML IJ SOLN: 4.0000 mg | INTRAMUSCULAR | Status: DC | PRN

## 2015-04-19 MED ORDER — ACETAMINOPHEN 650 MG RE SUPP: 650.0000 mg | RECTAL | Status: DC | PRN

## 2015-04-19 MED ORDER — SODIUM CHLORIDE 0.9 % IV SOLN: 250.0000 mL | INTRAVENOUS | Status: DC

## 2015-04-19 MED ORDER — HEMOSTATIC AGENTS (NO CHARGE) OPTIME
TOPICAL | Status: DC | PRN
  Administered 2015-04-19: 1 via TOPICAL

## 2015-04-19 MED ORDER — FENTANYL CITRATE (PF) 100 MCG/2ML IJ SOLN
INTRAMUSCULAR | Status: AC
  Administered 2015-04-19: 100 ug
  Filled 2015-04-19: qty 2

## 2015-04-19 MED ORDER — DOCUSATE SODIUM 100 MG PO CAPS
100.0000 mg | ORAL_CAPSULE | Freq: Two times a day (BID) | ORAL | Status: DC
  Administered 2015-04-19 – 2015-04-21 (×4): 100 mg via ORAL
  Filled 2015-04-19 (×4): qty 1

## 2015-04-19 MED ORDER — NEOSTIGMINE METHYLSULFATE 10 MG/10ML IV SOLN
INTRAVENOUS | Status: AC
  Filled 2015-04-19: qty 1

## 2015-04-19 MED ORDER — FENTANYL CITRATE (PF) 250 MCG/5ML IJ SOLN
INTRAMUSCULAR | Status: AC
  Filled 2015-04-19: qty 5

## 2015-04-19 MED ORDER — SUMATRIPTAN SUCCINATE 50 MG PO TABS
50.0000 mg | ORAL_TABLET | ORAL | Status: DC | PRN
  Administered 2015-04-20: 50 mg via ORAL
  Filled 2015-04-19 (×2): qty 1

## 2015-04-19 MED ORDER — HYDROMORPHONE HCL 1 MG/ML IJ SOLN
INTRAMUSCULAR | Status: AC
  Filled 2015-04-19: qty 1

## 2015-04-19 MED ORDER — HYDROMORPHONE HCL 1 MG/ML IJ SOLN
INTRAMUSCULAR | Status: AC
  Administered 2015-04-19: 0.5 mg
  Filled 2015-04-19: qty 1

## 2015-04-19 MED ORDER — CARBIDOPA-LEVODOPA 25-250 MG PO TABS
1.0000 | ORAL_TABLET | Freq: Three times a day (TID) | ORAL | Status: DC
  Administered 2015-04-20: 1 via ORAL
  Filled 2015-04-19 (×5): qty 1

## 2015-04-19 MED ORDER — LACTATED RINGERS IV SOLN
INTRAVENOUS | Status: DC
  Administered 2015-04-19 (×2): via INTRAVENOUS

## 2015-04-19 SURGICAL SUPPLY — 52 items
ADH SKN CLS APL DERMABOND .7 (GAUZE/BANDAGES/DRESSINGS) ×1
BAG DECANTER FOR FLEXI CONT (MISCELLANEOUS) ×3 IMPLANT
BLADE CLIPPER SURG (BLADE) IMPLANT
BUR ACORN 6.0 (BURR) IMPLANT
BUR ACORN 6.0MM (BURR)
BUR MATCHSTICK NEURO 3.0 LAGG (BURR) ×3 IMPLANT
CANISTER SUCT 3000ML PPV (MISCELLANEOUS) ×3 IMPLANT
DECANTER SPIKE VIAL GLASS SM (MISCELLANEOUS) ×3 IMPLANT
DERMABOND ADVANCED (GAUZE/BANDAGES/DRESSINGS) ×2
DERMABOND ADVANCED .7 DNX12 (GAUZE/BANDAGES/DRESSINGS) ×1 IMPLANT
DEVICE COFLEX STABLIZATION 8MM (Neuro Prosthesis/Implant) ×2 IMPLANT
DRAPE C-ARM 42X72 X-RAY (DRAPES) ×6 IMPLANT
DRAPE C-ARMOR (DRAPES) ×3 IMPLANT
DRAPE LAPAROTOMY T 102X78X121 (DRAPES) ×3 IMPLANT
DRAPE MICROSCOPE LEICA (MISCELLANEOUS) IMPLANT
DRAPE POUCH INSTRU U-SHP 10X18 (DRAPES) ×3 IMPLANT
DRAPE PROXIMA HALF (DRAPES) IMPLANT
DURAPREP 26ML APPLICATOR (WOUND CARE) ×3 IMPLANT
ELECT REM PT RETURN 9FT ADLT (ELECTROSURGICAL) ×3
ELECTRODE REM PT RTRN 9FT ADLT (ELECTROSURGICAL) ×1 IMPLANT
GAUZE SPONGE 4X4 12PLY STRL (GAUZE/BANDAGES/DRESSINGS) ×3 IMPLANT
GAUZE SPONGE 4X4 16PLY XRAY LF (GAUZE/BANDAGES/DRESSINGS) IMPLANT
GLOVE BIOGEL PI IND STRL 8.5 (GLOVE) ×1 IMPLANT
GLOVE BIOGEL PI INDICATOR 8.5 (GLOVE) ×2
GLOVE ECLIPSE 8.5 STRL (GLOVE) ×3 IMPLANT
GLOVE EXAM NITRILE LRG STRL (GLOVE) IMPLANT
GLOVE EXAM NITRILE MD LF STRL (GLOVE) IMPLANT
GLOVE EXAM NITRILE XL STR (GLOVE) IMPLANT
GLOVE EXAM NITRILE XS STR PU (GLOVE) IMPLANT
GOWN STRL REUS W/ TWL LRG LVL3 (GOWN DISPOSABLE) IMPLANT
GOWN STRL REUS W/ TWL XL LVL3 (GOWN DISPOSABLE) IMPLANT
GOWN STRL REUS W/TWL 2XL LVL3 (GOWN DISPOSABLE) ×3 IMPLANT
GOWN STRL REUS W/TWL LRG LVL3 (GOWN DISPOSABLE)
GOWN STRL REUS W/TWL XL LVL3 (GOWN DISPOSABLE)
KIT BASIN OR (CUSTOM PROCEDURE TRAY) ×3 IMPLANT
KIT ROOM TURNOVER OR (KITS) ×3 IMPLANT
NDL SPNL 20GX3.5 QUINCKE YW (NEEDLE) IMPLANT
NEEDLE HYPO 22GX1.5 SAFETY (NEEDLE) ×3 IMPLANT
NEEDLE SPNL 20GX3.5 QUINCKE YW (NEEDLE) IMPLANT
NS IRRIG 1000ML POUR BTL (IV SOLUTION) ×3 IMPLANT
PACK LAMINECTOMY NEURO (CUSTOM PROCEDURE TRAY) ×3 IMPLANT
PAD ARMBOARD 7.5X6 YLW CONV (MISCELLANEOUS) ×9 IMPLANT
PATTIES SURGICAL .5 X1 (DISPOSABLE) ×3 IMPLANT
RUBBERBAND STERILE (MISCELLANEOUS) IMPLANT
SPONGE SURGIFOAM ABS GEL SZ50 (HEMOSTASIS) ×3 IMPLANT
SUT VIC AB 1 CT1 18XBRD ANBCTR (SUTURE) ×1 IMPLANT
SUT VIC AB 1 CT1 8-18 (SUTURE) ×3
SUT VIC AB 2-0 CP2 18 (SUTURE) ×3 IMPLANT
SUT VIC AB 3-0 SH 8-18 (SUTURE) ×3 IMPLANT
TOWEL OR 17X24 6PK STRL BLUE (TOWEL DISPOSABLE) ×3 IMPLANT
TOWEL OR 17X26 10 PK STRL BLUE (TOWEL DISPOSABLE) ×3 IMPLANT
WATER STERILE IRR 1000ML POUR (IV SOLUTION) ×3 IMPLANT

## 2015-04-19 NOTE — Anesthesia Preprocedure Evaluation (Signed)
Anesthesia Evaluation  Patient identified by MRN, date of birth, ID band Patient awake    Reviewed: Allergy & Precautions, NPO status , Patient's Chart, lab work & pertinent test results  Airway Mallampati: II  TM Distance: >3 FB     Dental   Pulmonary sleep apnea ,    Pulmonary exam normal  + decreased breath sounds      Cardiovascular hypertension, + Peripheral Vascular Disease  Normal cardiovascular exam+ dysrhythmias  Rhythm:Regular Rate:Normal     Neuro/Psych  Headaches, TIA Neuromuscular disease    GI/Hepatic GERD  ,  Endo/Other    Renal/GU      Musculoskeletal  (+) Arthritis ,   Abdominal   Peds  Hematology   Anesthesia Other Findings   Reproductive/Obstetrics                             Anesthesia Physical Anesthesia Plan  ASA: III  Anesthesia Plan: General   Post-op Pain Management:    Induction: Intravenous  Airway Management Planned: Oral ETT  Additional Equipment:   Intra-op Plan:   Post-operative Plan: Extubation in OR  Informed Consent: I have reviewed the patients History and Physical, chart, labs and discussed the procedure including the risks, benefits and alternatives for the proposed anesthesia with the patient or authorized representative who has indicated his/her understanding and acceptance.     Plan Discussed with: CRNA, Anesthesiologist and Surgeon  Anesthesia Plan Comments:         Anesthesia Quick Evaluation

## 2015-04-19 NOTE — Anesthesia Procedure Notes (Signed)
Procedure Name: Intubation Date/Time: 04/19/2015 2:38 PM Performed by: Jenne Campus Pre-anesthesia Checklist: Patient identified, Emergency Drugs available, Suction available, Patient being monitored and Timeout performed Patient Re-evaluated:Patient Re-evaluated prior to inductionOxygen Delivery Method: Circle system utilized Preoxygenation: Pre-oxygenation with 100% oxygen Intubation Type: IV induction Ventilation: Mask ventilation without difficulty Laryngoscope Size: Miller and 2 Grade View: Grade I Tube type: Oral Tube size: 7.0 mm Number of attempts: 1 Airway Equipment and Method: Stylet Placement Confirmation: ETT inserted through vocal cords under direct vision,  positive ETCO2,  CO2 detector and breath sounds checked- equal and bilateral Secured at: 21 cm Tube secured with: Tape Dental Injury: Teeth and Oropharynx as per pre-operative assessment

## 2015-04-19 NOTE — Anesthesia Postprocedure Evaluation (Signed)
  Anesthesia Post-op Note  Patient: [name]  Procedure(s) Performed: Procedure(s) with comments: LUMBAR TWO-THREE LUMBAR LAMINECTOMY WITH COFLEX  (Bilateral) - Bilateral L23 laminectomy and foraminotomy with coflex  Patient Location: PACU  Anesthesia Type:General  Level of Consciousness: awake, alert  and oriented  Airway and Oxygen Therapy: Patient Spontanous Breathing and Patient connected to nasal cannula oxygen  Post-op Pain: mild  Post-op Assessment: Post-op Vital signs reviewed, Patient's Cardiovascular Status Stable, Respiratory Function Stable, Patent Airway and Pain level controlled              Post-op Vital Signs: stable  Last Vitals:  Filed Vitals:   04/19/15 0949  BP: 145/79  Pulse: 106  Temp: 36.8 C  Resp: 16    Complications: No apparent anesthesia complications

## 2015-04-19 NOTE — H&P (Addendum)
[name] is an 74 y.o. female.   Chief Complaint: Back and bilateral leg pain HPI: Patient is a 74 year old individual cared for over a number of years she's had back pain and lumbar radiculopathy in the past as well as had a decompression at L5-S1 on the right side she has issues of chronic back pain but these are gotten considerably worse in the last several months and she notes that there is been a substantial decrease in her stamina on her feet. She has symptoms of neurogenic claudication which prevented her from walking distances greater than 50 feet at this point an MRI was recently performed and this demonstrates that the patient has developed significant spondylitic changes at L2-L3 with central canal stenosis due to a subligamentous disc protrusion at L2-L3 in addition to marked facet hypertrophy and intraspinous ligamentous hypertrophy causing concentric spinal stenosis. [name] been advised regarding the need for surgery and is now admitted to undergo surgical decompression via bilateral laminotomies foraminotomies and placement of a Coflex device.  Past Medical History  Diagnosis Date  . GERD (gastroesophageal reflux disease)     hx pud '98  . Arthritis     djd  . Restless leg syndrome     takes Sinemet daily  . 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 Dysrhythmia     hx tachy palpitations  . Neuromuscular disorder ([facility])     peripheral neuropathy, FEET AND LEGS  . Stroke [facility])     ? 6 months ago - tests okay - [facility]   . Cellulitis     10/11/11 hospitalized for cellulitis   . Hyperlipemia     takes Atorvastatin daily  . Peripheral neuropathy ([facility])   . Elbow fracture, left April 2015  . Depression     takes Zoloft daily  . Vertigo     takes Meclizine daily as needed  . Insomnia     takes restoril nightly as needed  . Hypertension     takes HCTZ daily  . Peripheral neuropathy ([facility])     takes Gabapentin daily  . History of bronchitis     many yrs ago  .  Headache(784.0)     migraines yrs ago-takes Imitrex daily  . Joint pain   . Joint swelling   . Chronic back pain     scoliosis/stenosis  . Anxiety     takes Ativan daily as needed  . Cataracts, bilateral     immature     Past Surgical History  Procedure Laterality Date  . Cervical disc surgery x2  2011  . Rt carotid enarterectomy  2011  . Rt knee arthroscopy  '99  . Left knee arthroscopy  2001  . Hematoma evacuation  2011    s/p rt cea  . Joint replacement  '01    total knee replacement, LEFT  . Abdominal hysterectomy  1975    bil oophorectomy  . Back surgery   MANY YRS AGO    laminectomy x3  . Cholecystectomy  1993  . Total knee arthroplasty  09/13/2011    Procedure: TOTAL KNEE ARTHROPLASTY;  Surgeon: Magnus Sinning, MD;  Location: [facility];  Service: Orthopedics;  Laterality: Right;  . Right knee replacement      09/2011   . Carpal tunnel release  07/09/2012    Procedure: CARPAL TUNNEL RELEASE;  Surgeon: Magnus Sinning, MD;  Location: [facility];  Service: Orthopedics;  Laterality: Left;  . Finger arthroplasty  07/09/2012    Procedure: FINGER ARTHROPLASTY;  Surgeon: Magnus Sinning, MD;  Location: [facility];  Service: Orthopedics;  Laterality: Left;  Interposition Arthroplasty [facility] Joint Thumb Left   . Lipoma excision  07/09/2012    Procedure: EXCISION LIPOMA;  Surgeon: Magnus Sinning, MD;  Location: [facility];  Service: Orthopedics;  Laterality: Left;  Excision Lipoma Dorsum Left Wrist   . Orif ankle fracture Right 09/14/2014    FIBULA   . Orif ankle fracture Right 09/14/2014    Procedure: OPEN REDUCTION INTERNAL FIXATION (ORIF) RIGHT ANKLE FRACTURE/SYNDESMOSIS ;  Surgeon: Wylene Simmer, MD;  Location: [facility];  Service: Orthopedics;  Laterality: Right;  . Colonosocpy    . Esophagogastroduodenoscopy      Family History  Problem Relation Age of Onset  . Congestive Heart Failure Father   . Congestive Heart Failure Sister   . Alzheimer's disease Mother   . Diabetes Brother   .  Arthritis Brother    Social History:  reports that she has never smoked. She has never used smokeless tobacco. She reports that she does not drink alcohol or use illicit drugs.  Allergies:  Allergies  Allergen Reactions  . Contrast Media [Iodinated Diagnostic Agents] Other (See Comments)    Respiratory failure  . Sulfa Drugs Cross Reactors Nausea And Vomiting    Medications Prior to Admission  Medication Sig Dispense Refill  . aspirin 325 MG tablet Take 325 mg by mouth daily.    Marland Kitchen atorvastatin (LIPITOR) 40 MG tablet Take one tablet by mouth once daily for cholesterol (Patient taking differently: Take 40 mg by mouth daily. ) 90 tablet 3  . calcium carbonate 1250 MG capsule Take 1,250 mg by mouth daily.     . carbidopa-levodopa (SINEMET CR) 50-200 MG per tablet Take 1 tablet by mouth at bedtime. 90 tablet 3  . carbidopa-levodopa (SINEMET IR) 25-250 MG per tablet Take one tablet by mouth three times daily with meals (Patient taking differently: Take 1 tablet by mouth 3 (three) times daily with meals. ) 270 tablet 3  . cholecalciferol (VITAMIN D) 1000 UNITS tablet Take 1,000 Units by mouth daily.    Marland Kitchen gabapentin (NEURONTIN) 600 MG tablet Take 1 tablet by mouth 4  times daily (Patient taking differently: Take 600 mg by mouth 4  times daily) 360 tablet 0  . hydrochlorothiazide (HYDRODIURIL) 25 MG tablet Take 1 tablet by mouth in  the morning (Patient taking differently: Take 25 mg by mouth in  the morning) 90 tablet 1  . LORazepam (ATIVAN) 1 MG tablet Take 1 tablet (1 mg total) by mouth every 8 (eight) hours as needed for anxiety. 90 tablet 3  . Phenylephrine HCl (AFRIN ALLERGY NA) Place 2 sprays into the nose daily as needed (Nasal congestion).    . potassium chloride SA (K-DUR,KLOR-CON) 20 MEQ tablet Take two tablet daily for potassium (Patient taking differently: Take 40 mEq by mouth daily. ) 180 tablet 1  . pyridOXINE (VITAMIN B-6) 50 MG tablet Take 50 mg by mouth daily.    . sertraline  (ZOLOFT) 50 MG tablet Take two tablets by mouth once daily for depression (Patient taking differently: Take 100 mg by mouth daily. ) 180 tablet 3  . temazepam (RESTORIL) 7.5 MG capsule Take 1 capsule (7.5 mg total) by mouth at bedtime as needed for sleep. 90 capsule 1  . acetaminophen (TYLENOL) 325 MG tablet Take 2 tablets (650 mg total) by mouth every 6 (six) hours as needed for mild pain (or Fever >/=  101).    . amoxicillin (AMOXIL) 500 MG capsule Take 2,000 mg by mouth See admin instructions. Must take 4 capsules 1 hour before dental procedures    . doxycycline (VIBRA-TABS) 100 MG tablet Take one tablet by mouth twice daily for infection (Patient not taking: Reported on 04/14/2015) 20 tablet 0  . DULoxetine (CYMBALTA) 30 MG capsule Take one tablet by mouth twice daily for ONE week; then take one tablet once daily for ONE week (Patient not taking: Reported on 04/14/2015) 21 capsule 0  . fluticasone (FLONASE) 50 MCG/ACT nasal spray SHAKE WELL AND USE 1 SPRAY IN EACH NOSTRIL DAILY AS NEEDED FOR ALLERGIES OR RHINITIS (Patient not taking: Reported on 04/14/2015) 48 g 1  . HYDROcodone-acetaminophen (NORCO/VICODIN) 5-325 MG tablet Take 1 tablet by mouth every 6 (six) hours as needed (Must last 28 days). 60 tablet 0  . meclizine (ANTIVERT) 25 MG tablet Take one tablet by mouth once daily as needed for dizziness. (Patient taking differently: Take 25 mg by mouth daily as needed for dizziness. ) 30 tablet 0  . nystatin (MYCOSTATIN/NYSTOP) 100000 UNIT/GM POWD Apply beneath left breast for yeast rash (Patient not taking: Reported on 04/14/2015) 15 g 1  . oxyCODONE-acetaminophen (PERCOCET/ROXICET) 5-325 MG tablet Take 1 tablet by mouth every 4 (four) hours as needed. (Patient not taking: Reported on 04/14/2015) 10 tablet 0  . sertraline (ZOLOFT) 50 MG tablet Take 1/2 (52m) tablet by mouth for one week, then take one tablet by mouth for one week then Take two tablets by mouth once daily (Patient not taking: Reported  on 04/14/2015) 60 tablet 3  . SUMAtriptan (IMITREX) 50 MG tablet See AUX Label (Patient taking differently: Take 50 mg by mouth as needed for migraine or headache) 30 tablet 2    Results for orders placed or performed during the hospital encounter of 04/19/15 (from the past 48 hour(s))  CBC     Status: None   Collection Time: 04/19/15 11:02 AM  Result Value Ref Range   WBC 9.9 4.0 - 10.5 K/uL   RBC 4.11 3.87 - 5.11 MIL/uL   Hemoglobin 12.6 12.0 - 15.0 g/dL   HCT 40.4 36.0 - 46.0 %   MCV 98.3 78.0 - 100.0 fL   MCH 30.7 26.0 - 34.0 pg   MCHC 31.2 30.0 - 36.0 g/dL   RDW 13.0 11.5 - 15.5 %   Platelets 287 150 - 400 K/uL  Basic metabolic panel     Status: Abnormal   Collection Time: 04/19/15 11:02 AM  Result Value Ref Range   Sodium 137 135 - 145 mmol/L   Potassium 3.9 3.5 - 5.1 mmol/L   Chloride 101 101 - 111 mmol/L   CO2 27 22 - 32 mmol/L   Glucose, Bld 110 (H) 65 - 99 mg/dL   BUN 11 6 - 20 mg/dL   Creatinine, Ser 1.07 (H) 0.44 - 1.00 mg/dL   Calcium 9.3 8.9 - 10.3 mg/dL   GFR calc non Af Amer 50 (L) >60 mL/min   GFR calc Af Amer 58 (L) >60 mL/min    Comment: (NOTE) The eGFR has been calculated using the CKD EPI equation. This calculation has not been validated in all clinical situations. eGFR's persistently <60 mL/min signify possible Chronic Kidney Disease.    Anion gap 9 5 - 15  Surgical pcr screen     Status: Abnormal   Collection Time: 04/19/15 11:16 AM  Result Value Ref Range   MRSA, PCR NEGATIVE NEGATIVE   Staphylococcus aureus  POSITIVE (A) NEGATIVE    Comment:        The Xpert SA Assay (FDA approved for NASAL specimens in patients over 94 years of age), is one component of a comprehensive surveillance program.  Test performance has been validated by [facility] for patients greater than or equal to 79 year old. It is not intended to diagnose infection nor to guide or monitor treatment.    No results found.  Review of Systems  HENT: Negative.   Eyes:  Negative.   Respiratory: Negative.   Cardiovascular: Negative.   Gastrointestinal: Negative.   Genitourinary: Negative.   Musculoskeletal: Positive for back pain.       Bilateral leg pain  Skin: Negative.   Neurological: Positive for weakness.  Psychiatric/Behavioral: Negative.     Blood pressure 145/79, pulse 106, temperature 98.2 F (36.8 C), temperature source Oral, resp. rate 16, height _0  (1.651 m), weight 92.08 kg (203 lb), SpO2 97 %. Physical Exam  Constitutional: [name] oriented to person, place, and time. She appears well-developed and well-nourished.  HENT:  Head: Normocephalic and atraumatic.  Eyes: Conjunctivae and EOM are normal. Pupils are equal, round, and reactive to light.  Neck: Normal range of motion. Neck supple.  Cardiovascular: Normal rate and regular rhythm.   Respiratory: Effort normal and breath sounds normal.  GI: Soft. Bowel sounds are normal.  Musculoskeletal:  Significant central back pain to palpation and percussion.  Neurological: [name] alert and oriented to person, place, and time.  Bilateral positive straight leg raising at 15 Patrick's maneuver is negative  Skin: Skin is warm and dry.  Psychiatric: She has a normal mood and affect. Her behavior is normal. Judgment and thought content normal.     Assessment/Plan Central canal stenosis L2-L3 Bilateral laminotomies and foraminotomies L2-L3 posterior stabilization with Coflex.  Opie Fanton J 04/19/2015, 2:28 PM

## 2015-04-19 NOTE — Transfer of Care (Signed)
Immediate Anesthesia Transfer of Care Note  Patient: [name]  Procedure(s) Performed: Procedure(s) with comments: LUMBAR TWO-THREE LUMBAR LAMINECTOMY WITH COFLEX  (Bilateral) - Bilateral L23 laminectomy and foraminotomy with coflex  Patient Location: PACU  Anesthesia Type:General  Level of Consciousness: awake, alert  and oriented  Airway & Oxygen Therapy: Patient Spontanous Breathing and Patient connected to face mask oxygen  Post-op Assessment: Report given to RN, Post -op Vital signs reviewed and stable and Patient moving all extremities X 4  Post vital signs: Reviewed and stable  Last Vitals:  Filed Vitals:   04/19/15 0949  BP: 145/79  Pulse: 106  Temp: 36.8 C  Resp: 16    Complications: No apparent anesthesia complications

## 2015-04-19 NOTE — Op Note (Signed)
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  Surgeon: Kristeen Miss M.D. First assistant: Cyndy Freeze M.D. Anesthesia: Gen. endotracheal Indications: [name] a 74 year old individual who's had significant back and bilateral leg pain is been getting gradually amplified over the past number of months she is gotten to the point where she has severe neurogenic claudication with stenosis not allowing her to walk greater than 50 feet at a time. And MRI recently demonstrates the presence of a centrally herniated disc in addition to marked facet hypertrophy at the level of L2-L3 causing severe central canal stenosis and lateral recess stenosis. [name] been advised regarding the need for surgical decompression and was advised regarding the use of a posterior stabilization device on his Coflex.  Procedure: Patient was brought to the operating room supine on a stretcher. After the smooth induction of general endotracheal anesthesia, she was turned prone, back was prepped with alcohol and DuraPrep, and then draped in a sterile fashion. Midline incision was created about the level of L2-L3 and the first spinous processes were identified as L2 and L3. The dissection was carried out in a subperiosteal fashion and the facets were uncovered at the L2-L3 level. Laminectomy was performed removing the inferior margin lamina of L2 out to the medial wall the facet the partial mesial facetectomy was also performed. The L ligament was taken up. Common dural tube was identified. The dissection was carried  out laterally and superiorly to expose the takeoff of the L III nerve root inferiorly and the L4 nerve root superiorly. At the level of the disc there is no obvious significant mass protruding posteriorly which was soft but contained under ligament. After both sides were decompressed it is felt that the path of the L2 nerve root superiorly was decompressed well with a 2 and 3 mm Kerrison punch and the L3 nerve root was also decompressed however the central canal still seem to be markedly indented by the significant mass. The mass was then incised on one side and a discectomy was carried out at L2-L3 from the left side and then from the right side. Significant quantities of severely degenerated and desiccated disc material were removed. The disc space was evacuated. The disc was severely degenerated. Once all the disc material could be removed was removed and the subligamentous space was cleared then attention was turned to the interspinous ligament. This was opened at the L2-L3 level and the interspace was sized for appropriate size Coflex device. It was felt that an 8 mm Coflex device would fit best. This was then inserted and clamped onto the spinous process. Fluoroscopic images were obtained in AP and lateral projection and verified good position of the Coflex. Then the wound was carefully checked for hemostasis and once verified the lumbar dorsal fascia was closed with #1 Vicryls in interrupted fashion, 20 Vicryls Sabatini's tissues, 30 Vicryls subcuticularly. 20 mL of half percent Marcaine was injected into the paraspinous fascia. The patient tolerated procedure well blood loss is estimated less than 50 mL.

## 2015-04-20 ENCOUNTER — Encounter (HOSPITAL_COMMUNITY): Payer: Self-pay | Admitting: Neurological Surgery

## 2015-04-20 DIAGNOSIS — M62830 Muscle spasm of back: Secondary | ICD-10-CM

## 2015-04-20 DIAGNOSIS — G43919 Migraine, unspecified, intractable, without status migrainosus: Secondary | ICD-10-CM

## 2015-04-20 LAB — TSH: TSH: 0.89 u[IU]/mL (ref 0.350–4.500)

## 2015-04-20 LAB — COMPREHENSIVE METABOLIC PANEL
ANION GAP: 9 (ref 5–15)
AST: 28 U/L (ref 15–41)
Albumin: 2.9 g/dL — ABNORMAL LOW (ref 3.5–5.0)
Alkaline Phosphatase: 107 U/L (ref 38–126)
BUN: 15 mg/dL (ref 6–20)
CHLORIDE: 99 mmol/L — AB (ref 101–111)
CO2: 29 mmol/L (ref 22–32)
Calcium: 9.2 mg/dL (ref 8.9–10.3)
Creatinine, Ser: 1.08 mg/dL — ABNORMAL HIGH (ref 0.44–1.00)
GFR, EST AFRICAN AMERICAN: 58 mL/min — AB (ref >60)
GFR, EST NON AFRICAN AMERICAN: 50 mL/min — AB (ref >60)
Glucose, Bld: 159 mg/dL — ABNORMAL HIGH (ref 65–99)
POTASSIUM: 3.7 mmol/L (ref 3.5–5.1)
SODIUM: 137 mmol/L (ref 135–145)
Total Bilirubin: 0.4 mg/dL (ref 0.3–1.2)
Total Protein: 6.2 g/dL — ABNORMAL LOW (ref 6.5–8.1)

## 2015-04-20 MED ORDER — BACLOFEN 20 MG PO TABS
20.0000 mg | ORAL_TABLET | Freq: Every day | ORAL | Status: DC
  Administered 2015-04-20: 20 mg via ORAL
  Filled 2015-04-20 (×2): qty 1

## 2015-04-20 MED ORDER — MUPIROCIN 2 % EX OINT
1.0000 "application " | TOPICAL_OINTMENT | Freq: Two times a day (BID) | CUTANEOUS | Status: DC
  Administered 2015-04-20 (×3): 1 via NASAL

## 2015-04-20 MED ORDER — CARBIDOPA-LEVODOPA 25-250 MG PO TABS
0.5000 | ORAL_TABLET | Freq: Three times a day (TID) | ORAL | Status: DC
  Administered 2015-04-20: 0.5 via ORAL
  Administered 2015-04-20: 12:00:00 via ORAL
  Administered 2015-04-21 (×2): 0.5 via ORAL
  Filled 2015-04-20 (×7): qty 1

## 2015-04-20 MED ORDER — CARBIDOPA-LEVODOPA ER 25-100 MG PO TBCR
1.0000 | EXTENDED_RELEASE_TABLET | Freq: Two times a day (BID) | ORAL | Status: DC
  Administered 2015-04-20 – 2015-04-21 (×2): 1 via ORAL
  Filled 2015-04-20 (×3): qty 1

## 2015-04-20 MED ORDER — BACLOFEN 5 MG HALF TABLET
5.0000 mg | ORAL_TABLET | Freq: Two times a day (BID) | ORAL | Status: DC
  Administered 2015-04-20 – 2015-04-21 (×3): 5 mg via ORAL
  Filled 2015-04-20 (×4): qty 1

## 2015-04-20 MED ORDER — VALPROATE SODIUM 500 MG/5ML IV SOLN
125.0000 mg | Freq: Four times a day (QID) | INTRAVENOUS | Status: DC
  Administered 2015-04-20 – 2015-04-21 (×4): 125 mg via INTRAVENOUS
  Filled 2015-04-20 (×6): qty 1.25

## 2015-04-20 NOTE — NC FL2 (Signed)
[place] LEVEL OF CARE SCREENING TOOL     IDENTIFICATION  Patient Name: [name] Birthdate: Nov 04, 1940 Sex: female Admission Date (Current Location): 04/19/2015  [facility] and [place] Number: Academic librarian and Address:  [facility], [place] [address] [place], [place] [address]      Provider Number: [id number]  Attending Physician Name and Address:  Kristeen Miss, MD  Relative Name and Phone Number:       Current Level of Care: Hospital Recommended Level of Care: [place] Prior Approval Number:    Date Approved/Denied:   PASRR Number:    Discharge Plan: SNF    Current Diagnoses: Patient Active Problem List   Diagnosis Date Noted  . Lumbar stenosis 04/19/2015  . Hypokalemia 09/19/2014  . Constipation 09/19/2014  . Acute encephalopathy 09/18/2014  . Acute respiratory failure ([place]) 09/17/2014  . OSA (obstructive sleep apnea) 09/17/2014  . Ankle fracture, lateral malleolus, closed 09/14/2014  . Major depressive disorder, recurrent severe without psychotic features ([place]) 08/07/2014  . Insomnia 08/05/2014  . Hip joint painful on movement 08/05/2014  . Degenerative disc disease, lumbar 08/05/2014  . Severe obesity (BMI >= 40) ([place]) 01/11/2014  . Hyperlipemia   . Depression   . Arthritis   . Polyneuropathy in other diseases classified elsewhere ([place]) 04/08/2013  . Restless legs syndrome (RLS) 04/08/2013  . UTI (lower urinary tract infection) 10/14/2011  . Dizziness 10/12/2011  . Fall 10/12/2011  . TIA (transient ischemic attack) 10/12/2011  . Cellulitis 10/12/2011  . HTN (hypertension) 10/12/2011  . GERD (gastroesophageal reflux disease) 10/12/2011    Orientation ACTIVITIES/SOCIAL BLADDER RESPIRATION    Self, Time, Situation, Place    Continent Normal  BEHAVIORAL SYMPTOMS/MOOD NEUROLOGICAL BOWEL NUTRITION STATUS      Continent Diet (regular)  PHYSICIAN VISITS COMMUNICATION OF NEEDS Height &  Weight Skin    Verbally   203 lbs. Surgical wounds          AMBULATORY STATUS RESPIRATION    Supervision limited Normal      Personal Care Assistance Level of Assistance  Bathing, Dressing Bathing Assistance: Limited assistance   Dressing Assistance: Limited assistance      Functional Limitations Info                SPECIAL CARE FACTORS FREQUENCY  PT (By licensed PT)     PT Frequency: 5/wk             Additional Factors Info  Code Status, Allergies, Psychotropic Code Status Info: FULL Allergies Info: contrast media, sulfa drugs cross reactors Psychotropic Info: zoloft         Current Medications (04/20/2015): Current Facility-Administered Medications  Medication Dose Route Frequency Provider Last Rate Last Dose  . 0.9 %  sodium chloride infusion  250 mL Intravenous Continuous Kristeen Miss, MD      . acetaminophen (TYLENOL) tablet 650 mg  650 mg Oral Q4H PRN Kristeen Miss, MD       Or  . acetaminophen (TYLENOL) suppository 650 mg  650 mg Rectal Q4H PRN Kristeen Miss, MD      . alum & mag hydroxide-simeth (MAALOX/MYLANTA) 200-200-20 MG/5ML suspension 30 mL  30 mL Oral Q6H PRN Kristeen Miss, MD      . atorvastatin (LIPITOR) tablet 40 mg  40 mg Oral QHS Kristeen Miss, MD   40 mg at 04/19/15 2003  . bisacodyl (DULCOLAX) suppository 10 mg  10 mg Rectal Daily PRN Kristeen Miss, MD      .  carbidopa-levodopa (SINEMET CR) 50-200 MG per tablet controlled release 1 tablet  1 tablet Oral QHS Kristeen Miss, MD   1 tablet at 04/19/15 2009  . carbidopa-levodopa (SINEMET IR) 25-250 MG per tablet immediate release 1 tablet  1 tablet Oral TID WC Kristeen Miss, MD   1 tablet at 04/20/15 [phone]  . docusate sodium (COLACE) capsule 100 mg  100 mg Oral BID Kristeen Miss, MD   100 mg at 04/19/15 2004  . gabapentin (NEURONTIN) tablet 600 mg  600 mg Oral TID Kristeen Miss, MD   600 mg at 04/19/15 2004  . hydrochlorothiazide (HYDRODIURIL) tablet 25 mg  25 mg Oral Daily Kristeen Miss, MD      .  HYDROcodone-acetaminophen (NORCO/VICODIN) 5-325 MG per tablet 1 tablet  1 tablet Oral Q6H PRN Kristeen Miss, MD      . HYDROmorphone (DILAUDID) injection 0.5-1 mg  0.5-1 mg Intravenous Q2H PRN Kristeen Miss, MD   1 mg at 04/20/15 0241  . ketorolac (TORADOL) 15 MG/ML injection 15 mg  15 mg Intravenous 4 times per day Kristeen Miss, MD   15 mg at 04/20/15 0617  . LORazepam (ATIVAN) tablet 1 mg  1 mg Oral Q8H PRN Kristeen Miss, MD   1 mg at 04/20/15 0037  . menthol-cetylpyridinium (CEPACOL) lozenge 3 mg  1 lozenge Oral PRN Kristeen Miss, MD       Or  . phenol (CHLORASEPTIC) mouth spray 1 spray  1 spray Mouth/Throat PRN Kristeen Miss, MD      . methocarbamol (ROBAXIN) tablet 500 mg  500 mg Oral Q6H PRN Kristeen Miss, MD   500 mg at 04/19/15 1729   Or  . methocarbamol (ROBAXIN) 500 mg in dextrose 5 % 50 mL IVPB  500 mg Intravenous Q6H PRN Kristeen Miss, MD      . mupirocin ointment (BACTROBAN) 2 % 1 application  1 application Nasal BID Kristeen Miss, MD   1 application at [id number]  . ondansetron (ZOFRAN) injection 4 mg  4 mg Intravenous Q4H PRN Kristeen Miss, MD      . oxyCODONE-acetaminophen (PERCOCET/ROXICET) 5-325 MG per tablet 1-2 tablet  1-2 tablet Oral Q4H PRN Kristeen Miss, MD   2 tablet at 04/19/15 2258  . polyethylene glycol (MIRALAX / GLYCOLAX) packet 17 g  17 g Oral Daily PRN Kristeen Miss, MD      . potassium chloride SA (K-DUR,KLOR-CON) CR tablet 40 mEq  40 mEq Oral Daily Kristeen Miss, MD      . [facility]) tablet 8.6 mg  1 tablet Oral BID Kristeen Miss, MD   8.6 mg at 04/19/15 2004  . sertraline (ZOLOFT) tablet 100 mg  100 mg Oral Daily Kristeen Miss, MD      . sodium chloride 0.9 % injection 3 mL  3 mL Intravenous Q12H Kristeen Miss, MD   3 mL at 04/19/15 2008  . sodium chloride 0.9 % injection 3 mL  3 mL Intravenous PRN Kristeen Miss, MD      . SUMAtriptan (IMITREX) tablet 50 mg  50 mg Oral Q2H PRN Kristeen Miss, MD   50 mg at 04/20/15 0811  . temazepam (RESTORIL) capsule 7.5 mg  7.5 mg Oral  QHS PRN Kristeen Miss, MD       Do not use this list as official medication orders. Please verify with discharge summary.  Discharge Medications:   Medication List    ASK your doctor about these medications        acetaminophen 325 MG tablet  Commonly  known as:  TYLENOL  Take 2 tablets (650 mg total) by mouth every 6 (six) hours as needed for mild pain (or Fever >/= 101).     AFRIN ALLERGY NA  Place 2 sprays into the nose daily as needed (Nasal congestion).     amoxicillin 500 MG capsule  Commonly known as:  AMOXIL  Take 2,000 mg by mouth See admin instructions. Must take 4 capsules 1 hour before dental procedures     aspirin 325 MG tablet  Take 325 mg by mouth daily.     atorvastatin 40 MG tablet  Commonly known as:  LIPITOR  Take one tablet by mouth once daily for cholesterol     calcium carbonate 1250 MG capsule  Take 1,250 mg by mouth daily.     carbidopa-levodopa 50-200 MG tablet  Commonly known as:  SINEMET CR  Take 1 tablet by mouth at bedtime.     carbidopa-levodopa 25-250 MG tablet  Commonly known as:  SINEMET IR  Take one tablet by mouth three times daily with meals     cholecalciferol 1000 UNITS tablet  Commonly known as:  VITAMIN D  Take 1,000 Units by mouth daily.     doxycycline 100 MG tablet  Commonly known as:  VIBRA-TABS  Take one tablet by mouth twice daily for infection     DULoxetine 30 MG capsule  Commonly known as:  CYMBALTA  Take one tablet by mouth twice daily for ONE week; then take one tablet once daily for ONE week     fluticasone 50 MCG/ACT nasal spray  Commonly known as:  FLONASE  SHAKE WELL AND USE 1 SPRAY IN EACH NOSTRIL DAILY AS NEEDED FOR ALLERGIES OR RHINITIS     gabapentin 600 MG tablet  Commonly known as:  NEURONTIN  Take 1 tablet by mouth 4  times daily     hydrochlorothiazide 25 MG tablet  Commonly known as:  HYDRODIURIL  Take 1 tablet by mouth in  the morning     HYDROcodone-acetaminophen 5-325 MG tablet  Commonly  known as:  NORCO/VICODIN  Take 1 tablet by mouth every 6 (six) hours as needed (Must last 28 days).     LORazepam 1 MG tablet  Commonly known as:  ATIVAN  Take 1 tablet (1 mg total) by mouth every 8 (eight) hours as needed for anxiety.     meclizine 25 MG tablet  Commonly known as:  ANTIVERT  Take one tablet by mouth once daily as needed for dizziness.     nystatin 100000 UNIT/GM Powd  Apply beneath left breast for yeast rash     oxyCODONE-acetaminophen 5-325 MG tablet  Commonly known as:  PERCOCET/ROXICET  Take 1 tablet by mouth every 4 (four) hours as needed.     potassium chloride SA 20 MEQ tablet  Commonly known as:  K-DUR,KLOR-CON  Take two tablet daily for potassium     pyridOXINE 50 MG tablet  Commonly known as:  VITAMIN B-6  Take 50 mg by mouth daily.     sertraline 50 MG tablet  Commonly known as:  ZOLOFT  Take 1/2 (25mg ) tablet by mouth for one week, then take one tablet by mouth for one week then Take two tablets by mouth once daily     sertraline 50 MG tablet  Commonly known as:  ZOLOFT  Take two tablets by mouth once daily for depression     SUMAtriptan 50 MG tablet  Commonly known as:  IMITREX  See AUX Label  temazepam 7.5 MG capsule  Commonly known as:  RESTORIL  Take 1 capsule (7.5 mg total) by mouth at bedtime as needed for sleep.        Relevant Imaging Results:  Relevant Lab Results:  Recent Labs    Additional Information    Cranford Mon, LCSW

## 2015-04-20 NOTE — Progress Notes (Signed)
Orthopedic Tech Progress Note Patient Details:  [name] 1941-05-15 [id number]  Patient ID: [name], female   DOB: June 05, 1940, 74 y.o.   MRN: [id number] Called in bio-tech brace order; spoke with Dolores Lory, Laurene Melendrez 04/20/2015, 9:13 AM

## 2015-04-20 NOTE — Progress Notes (Signed)
Patient ID: [name], female   DOB: 08-26-1940, 74 y.o.   MRN: [id number] Vital signs are stable Patient has a severe migraine this morning [name] not having nausea just yet Her back pain is under good control Issue of chronic repetitive falls was discussed Patient has been seen by Dr. Floyde Parkins in the past Is not reevaluated her recently for this process Will ask for input hospital neurology consultation to review medications and evaluate problem further Continue supportive care for now.

## 2015-04-20 NOTE — Consult Note (Signed)
NEURO HOSPITALIST CONSULT NOTE   Referring physician: Dr. Ellene Route   Reason for Consult: HA  HPI:                                                                                                                                          [name] is an 74 y.o. female with history of migraines in the past that resolved after menopause.  She presented to hospital for back surgery. Today S/P surgery she noted she had a significant HA much like her prior Migraines. She has no neurological symptoms with her HA. She has had Imitrex with no relief.  Neurology was asked to evaluate. She states she has been on Sinemet for her RLS since she was in her 60's.   Past Medical History  Diagnosis Date  . GERD (gastroesophageal reflux disease)     hx pud '98  . Arthritis     djd  . Restless leg syndrome     takes Sinemet daily  . 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 Dysrhythmia     hx tachy palpitations  . Neuromuscular disorder ([place])     peripheral neuropathy, FEET AND LEGS  . Stroke [facility])     ? 6 months ago - tests okay - [facility]   . Cellulitis     10/11/11 hospitalized for cellulitis   . Hyperlipemia     takes Atorvastatin daily  . Peripheral neuropathy ([place])   . Elbow fracture, left April 2015  . Depression     takes Zoloft daily  . Vertigo     takes Meclizine daily as needed  . Insomnia     takes restoril nightly as needed  . Hypertension     takes HCTZ daily  . Peripheral neuropathy ([facility])     takes Gabapentin daily  . History of bronchitis     many yrs ago  . Headache(784.0)     migraines yrs ago-takes Imitrex daily  . Joint pain   . Joint swelling   . Chronic back pain     scoliosis/stenosis  . Anxiety     takes Ativan daily as needed  . Cataracts, bilateral     immature     Past Surgical History  Procedure Laterality Date  . Cervical disc surgery x2  2011  . Rt carotid enarterectomy  2011  . Rt knee arthroscopy  '99  . Left knee  arthroscopy  2001  . Hematoma evacuation  2011    s/p rt cea  . Joint replacement  '01    total knee replacement, LEFT  . Abdominal hysterectomy  1975    bil oophorectomy  . Back surgery   MANY YRS AGO    laminectomy x3  . Cholecystectomy  1993  .  Total knee arthroplasty  09/13/2011    Procedure: TOTAL KNEE ARTHROPLASTY;  Surgeon: Magnus Sinning, MD;  Location: [facility];  Service: Orthopedics;  Laterality: Right;  . Right knee replacement      09/2011   . Carpal tunnel release  07/09/2012    Procedure: CARPAL TUNNEL RELEASE;  Surgeon: Magnus Sinning, MD;  Location: [facility];  Service: Orthopedics;  Laterality: Left;  . Finger arthroplasty  07/09/2012    Procedure: FINGER ARTHROPLASTY;  Surgeon: Magnus Sinning, MD;  Location: [facility];  Service: Orthopedics;  Laterality: Left;  Interposition Arthroplasty [facility] Joint Thumb Left   . Lipoma excision  07/09/2012    Procedure: EXCISION LIPOMA;  Surgeon: Magnus Sinning, MD;  Location: [facility];  Service: Orthopedics;  Laterality: Left;  Excision Lipoma Dorsum Left Wrist   . Orif ankle fracture Right 09/14/2014    FIBULA   . Orif ankle fracture Right 09/14/2014    Procedure: OPEN REDUCTION INTERNAL FIXATION (ORIF) RIGHT ANKLE FRACTURE/SYNDESMOSIS ;  Surgeon: Wylene Simmer, MD;  Location: [place];  Service: Orthopedics;  Laterality: Right;  . Colonosocpy    . Esophagogastroduodenoscopy    . Lumbar laminectomy with coflex 1 level Bilateral 04/19/2015    Procedure: LUMBAR TWO-THREE LUMBAR LAMINECTOMY WITH COFLEX ;  Surgeon: Kristeen Miss, MD;  Location: [place] [facility];  Service: Neurosurgery;  Laterality: Bilateral;  Bilateral L23 laminectomy and foraminotomy with coflex    Family History  Problem Relation Age of Onset  . Congestive Heart Failure Father   . Congestive Heart Failure Sister   . Alzheimer's disease Mother   . Diabetes Brother   . Arthritis Brother     Social History:  reports that she has never smoked. She has never used smokeless  tobacco. She reports that she does not drink alcohol or use illicit drugs.  Allergies  Allergen Reactions  . Contrast Media [Iodinated Diagnostic Agents] Other (See Comments)    Respiratory failure  . Sulfa Drugs Cross Reactors Nausea And Vomiting    MEDICATIONS:                                                                                                                     Prior to Admission:  Prescriptions prior to admission  Medication Sig Dispense Refill Last Dose  . aspirin 325 MG tablet Take 325 mg by mouth daily.   04/11/15  . atorvastatin (LIPITOR) 40 MG tablet Take one tablet by mouth once daily for cholesterol (Patient taking differently: Take 40 mg by mouth daily. ) 90 tablet 3 04/18/2015 at Unknown time  . calcium carbonate 1250 MG capsule Take 1,250 mg by mouth daily.    04/18/2015 at Unknown time  . carbidopa-levodopa (SINEMET CR) 50-200 MG per tablet Take 1 tablet by mouth at bedtime. 90 tablet 3 04/18/2015 at Unknown time  . carbidopa-levodopa (SINEMET IR) 25-250 MG per tablet Take one tablet by mouth three times daily with meals (Patient taking differently: Take 1 tablet by mouth 3 (three) times  daily with meals. ) 270 tablet 3 04/19/2015 at 0700  . cholecalciferol (VITAMIN D) 1000 UNITS tablet Take 1,000 Units by mouth daily.   04/18/2015 at Unknown time  . gabapentin (NEURONTIN) 600 MG tablet Take 1 tablet by mouth 4  times daily (Patient taking differently: Take 600 mg by mouth 4  times daily) 360 tablet 0 04/19/2015 at 0700  . hydrochlorothiazide (HYDRODIURIL) 25 MG tablet Take 1 tablet by mouth in  the morning (Patient taking differently: Take 25 mg by mouth in  the morning) 90 tablet 1 04/19/2015 at 0700  . LORazepam (ATIVAN) 1 MG tablet Take 1 tablet (1 mg total) by mouth every 8 (eight) hours as needed for anxiety. 90 tablet 3 04/18/2015 at Unknown time  . Phenylephrine HCl (AFRIN ALLERGY NA) Place 2 sprays into the nose daily as needed (Nasal congestion).   Past Week  at Unknown time  . potassium chloride SA (K-DUR,KLOR-CON) 20 MEQ tablet Take two tablet daily for potassium (Patient taking differently: Take 40 mEq by mouth daily. ) 180 tablet 1 04/19/2015 at 0700  . pyridOXINE (VITAMIN B-6) 50 MG tablet Take 50 mg by mouth daily.   04/18/2015 at Unknown time  . sertraline (ZOLOFT) 50 MG tablet Take two tablets by mouth once daily for depression (Patient taking differently: Take 100 mg by mouth daily. ) 180 tablet 3 04/18/2015 at Unknown time  . temazepam (RESTORIL) 7.5 MG capsule Take 1 capsule (7.5 mg total) by mouth at bedtime as needed for sleep. 90 capsule 1 04/18/2015 at Unknown time  . acetaminophen (TYLENOL) 325 MG tablet Take 2 tablets (650 mg total) by mouth every 6 (six) hours as needed for mild pain (or Fever >/= 101).   Unknown at Unknown time  . amoxicillin (AMOXIL) 500 MG capsule Take 2,000 mg by mouth See admin instructions. Must take 4 capsules 1 hour before dental procedures   Unknown at Unknown time  . doxycycline (VIBRA-TABS) 100 MG tablet Take one tablet by mouth twice daily for infection (Patient not taking: Reported on 04/14/2015) 20 tablet 0   . DULoxetine (CYMBALTA) 30 MG capsule Take one tablet by mouth twice daily for ONE week; then take one tablet once daily for ONE week (Patient not taking: Reported on 04/14/2015) 21 capsule 0   . fluticasone (FLONASE) 50 MCG/ACT nasal spray SHAKE WELL AND USE 1 SPRAY IN EACH NOSTRIL DAILY AS NEEDED FOR ALLERGIES OR RHINITIS (Patient not taking: Reported on 04/14/2015) 48 g 1 Taking  . HYDROcodone-acetaminophen (NORCO/VICODIN) 5-325 MG tablet Take 1 tablet by mouth every 6 (six) hours as needed (Must last 28 days). 60 tablet 0 More than a month at Unknown time  . meclizine (ANTIVERT) 25 MG tablet Take one tablet by mouth once daily as needed for dizziness. (Patient taking differently: Take 25 mg by mouth daily as needed for dizziness. ) 30 tablet 0 Unknown at Unknown time  . nystatin (MYCOSTATIN/NYSTOP)  100000 UNIT/GM POWD Apply beneath left breast for yeast rash (Patient not taking: Reported on 04/14/2015) 15 g 1 Taking  . oxyCODONE-acetaminophen (PERCOCET/ROXICET) 5-325 MG tablet Take 1 tablet by mouth every 4 (four) hours as needed. (Patient not taking: Reported on 04/14/2015) 10 tablet 0   . sertraline (ZOLOFT) 50 MG tablet Take 1/2 (25mg ) tablet by mouth for one week, then take one tablet by mouth for one week then Take two tablets by mouth once daily (Patient not taking: Reported on 04/14/2015) 60 tablet 3   . SUMAtriptan (IMITREX) 50 MG tablet  See AUX Label (Patient taking differently: Take 50 mg by mouth as needed for migraine or headache) 30 tablet 2 Unknown at Unknown time   Scheduled: . atorvastatin  40 mg Oral QHS  . carbidopa-levodopa  1 tablet Oral QHS  . carbidopa-levodopa  1 tablet Oral TID WC  . docusate sodium  100 mg Oral BID  . gabapentin  600 mg Oral TID  . hydrochlorothiazide  25 mg Oral Daily  . ketorolac  15 mg Intravenous 4 times per day  . mupirocin ointment  1 application Nasal BID  . potassium chloride SA  40 mEq Oral Daily  . senna  1 tablet Oral BID  . sertraline  100 mg Oral Daily  . sodium chloride  3 mL Intravenous Q12H     ROS:                                                                                                                                       History obtained from the patient  General ROS: negative for - chills, fatigue, fever, night sweats, weight gain or weight loss Psychological ROS: negative for - behavioral disorder, hallucinations, memory difficulties, mood swings or suicidal ideation Ophthalmic ROS: negative for - blurry vision, double vision, eye pain or loss of vision ENT ROS: negative for - epistaxis, nasal discharge, oral lesions, sore throat, tinnitus or vertigo Allergy and Immunology ROS: negative for - hives or itchy/watery eyes Hematological and Lymphatic ROS: negative for - bleeding problems, bruising or swollen lymph  nodes Endocrine ROS: negative for - galactorrhea, hair pattern changes, polydipsia/polyuria or temperature intolerance Respiratory ROS: negative for - cough, hemoptysis, shortness of breath or wheezing Cardiovascular ROS: negative for - chest pain, dyspnea on exertion, edema or irregular heartbeat Gastrointestinal ROS: negative for - abdominal pain, diarrhea, hematemesis, nausea/vomiting or stool incontinence Genito-Urinary ROS: negative for - dysuria, hematuria, incontinence or urinary frequency/urgency Musculoskeletal ROS: negative for - joint swelling or muscular weakness Neurological ROS: as noted in HPI Dermatological ROS: negative for rash and skin lesion changes   Blood pressure 146/76, pulse 101, temperature 98.2 F (36.8 C), temperature source Oral, resp. rate 18, height 5\' 5"  (1.651 m), weight 92.08 kg (203 lb), SpO2 96 %.   Neurologic Examination:                                                                                                      HEENT-  Normocephalic, no lesions, without obvious abnormality.  Normal external eye and conjunctiva.  Normal auditory canals and external ears. Normal external nose,   Cardiovascular- S1, S2 normal, pulses palpable throughout   Lungs- chest clear, no wheezing, rales, normal symmetric air entry Abdomen- normal findings: bowel sounds normal Extremities- no skin discoloration Lymph-no adenopathy palpable Musculoskeletal-no joint tenderness, deformity or swelling Skin-warm and dry, no hyperpigmentation, vitiligo, or suspicious lesions  Neurological Examination Mental Status: Alert, oriented, thought content appropriate.  Speech fluent without evidence of aphasia.  Able to follow 3 step commands without difficulty. Cranial Nerves: II: Discs flat bilaterally; Visual fields grossly normal, pupils equal, round, reactive to light and accommodation III,IV, VI: ptosis not present, extra-ocular motions intact bilaterally V,VII: smile  symmetric, facial light touch sensation normal bilaterally VIII: hearing normal bilaterally IX,X: uvula rises symmetrically XI: bilateral shoulder shrug XII: midline tongue extension Motor: Right : Upper extremity   5/5    Left:     Upper extremity   5/5  Lower extremity   5/5     Lower extremity   5/5 Tone and bulk:normal tone throughout; no atrophy noted Sensory: Pinprick and light touch intact throughout, bilaterally Deep Tendon Reflexes: 1+ and symmetric throughout Plantars: Right: downgoing   Left: downgoing Cerebellar: normal finger-to-nose,  heel-to-shin test not obtained due to recent surgery Gait: not tested due to recent surgery      Lab Results: Basic Metabolic Panel:  Recent Labs Lab 04/19/15 1102  NA 137  K 3.9  CL 101  CO2 27  GLUCOSE 110*  BUN 11  CREATININE 1.07*  CALCIUM 9.3    Liver Function Tests: No results for input(s): AST, ALT, ALKPHOS, BILITOT, PROT, ALBUMIN in the last 168 hours. No results for input(s): LIPASE, AMYLASE in the last 168 hours. No results for input(s): AMMONIA in the last 168 hours.  CBC:  Recent Labs Lab 04/19/15 1102  WBC 9.9  HGB 12.6  HCT 40.4  MCV 98.3  PLT 287    Cardiac Enzymes: No results for input(s): CKTOTAL, CKMB, CKMBINDEX, TROPONINI in the last 168 hours.  Lipid Panel: No results for input(s): CHOL, TRIG, HDL, CHOLHDL, VLDL, LDLCALC in the last 168 hours.  CBG: No results for input(s): GLUCAP in the last 168 hours.  Microbiology: Results for orders placed or performed during the hospital encounter of 04/19/15  Surgical pcr screen     Status: Abnormal   Collection Time: 04/19/15 11:16 AM  Result Value Ref Range Status   MRSA, PCR NEGATIVE NEGATIVE Final   Staphylococcus aureus POSITIVE (A) NEGATIVE Final    Comment:        The Xpert SA Assay (FDA approved for NASAL specimens in patients over 31 years of age), is one component of a comprehensive surveillance program.  Test performance  has been validated by [facility] for patients greater than or equal to 59 year old. It is not intended to diagnose infection nor to guide or monitor treatment.     Coagulation Studies: No results for input(s): LABPROT, INR in the last 72 hours.  Imaging: Dg Lumbar Spine 2-3 Views  04/19/2015  CLINICAL DATA:  Bilateral L2-3 and laminectomy and foraminectomy with Coflex for lumbar spinal stenosis EXAM: DG C-ARM 61-120 MIN; LUMBAR SPINE - 2-3 VIEW COMPARISON:  None. FINDINGS: Fluoroscopic images demonstrate surgical changes with radiopaque device between the L2-3 spinous process. There is no malalignment. IMPRESSION: Surgical changes with radiopaque device between the L2-3 spinous process. There is no malalignment. Electronically Signed   By: Abelardo Diesel M.D.   On: 04/19/2015 16:30   Dg  Lumbar Spine 1 View  04/19/2015  CLINICAL DATA:  L2-3 laminectomy EXAM: LUMBAR SPINE - 1 VIEW COMPARISON:  02/26/2015 FINDINGS: Five lumbar type vertebral bodies are well visualized. Mild anterolisthesis is noted of L4 on L5 with disc space narrowing at L5-S1. Surgical instruments are noted adjacent to the posterior elements at L2 and L3. The numbering nomenclature is similar to that utilized on previous MRI. IMPRESSION: Intraoperative localization at L2-3 Electronically Signed   By: Inez Catalina M.D.   On: 04/19/2015 16:31   Dg C-arm 1-60 Min  04/19/2015  CLINICAL DATA:  Bilateral L2-3 and laminectomy and foraminectomy with Coflex for lumbar spinal stenosis EXAM: DG C-ARM 61-120 MIN; LUMBAR SPINE - 2-3 VIEW COMPARISON:  None. FINDINGS: Fluoroscopic images demonstrate surgical changes with radiopaque device between the L2-3 spinous process. There is no malalignment. IMPRESSION: Surgical changes with radiopaque device between the L2-3 spinous process. There is no malalignment. Electronically Signed   By: Abelardo Diesel M.D.   On: 04/19/2015 16:30   No current facility-administered medications for this  encounter.  Current outpatient prescriptions:  .  aspirin 325 MG tablet, Take 325 mg by mouth daily., Disp: , Rfl:  .  atorvastatin (LIPITOR) 40 MG tablet, Take one tablet by mouth once daily for cholesterol (Patient taking differently: Take 40 mg by mouth daily. ), Disp: 90 tablet, Rfl: 3 .  calcium carbonate 1250 MG capsule, Take 1,250 mg by mouth daily. , Disp: , Rfl:  .  cholecalciferol (VITAMIN D) 1000 UNITS tablet, Take 1,000 Units by mouth daily., Disp: , Rfl:  .  hydrochlorothiazide (HYDRODIURIL) 25 MG tablet, Take 1 tablet by mouth in  the morning (Patient taking differently: Take 25 mg by mouth in  the morning), Disp: 90 tablet, Rfl: 1 .  LORazepam (ATIVAN) 1 MG tablet, Take 1 tablet (1 mg total) by mouth every 8 (eight) hours as needed for anxiety., Disp: 90 tablet, Rfl: 3 .  Phenylephrine HCl (AFRIN ALLERGY NA), Place 2 sprays into the nose daily as needed (Nasal congestion)., Disp: , Rfl:  .  potassium chloride SA (K-DUR,KLOR-CON) 20 MEQ tablet, Take two tablet daily for potassium (Patient taking differently: Take 40 mEq by mouth daily. ), Disp: 180 tablet, Rfl: 1 .  pyridOXINE (VITAMIN B-6) 50 MG tablet, Take 50 mg by mouth daily., Disp: , Rfl:  .  temazepam (RESTORIL) 7.5 MG capsule, Take 1 capsule (7.5 mg total) by mouth at bedtime as needed for sleep., Disp: 90 capsule, Rfl: 1 .  acetaminophen (TYLENOL) 325 MG tablet, Take 2 tablets (650 mg total) by mouth every 6 (six) hours as needed for mild pain (or Fever >/= 101)., Disp: , Rfl:  .  amoxicillin (AMOXIL) 500 MG capsule, Take 2,000 mg by mouth See admin instructions. Must take 4 capsules 1 hour before dental procedures, Disp: , Rfl:  .  baclofen (LIORESAL) 20 MG tablet, Take 1 tablet (20 mg total) by mouth at bedtime., Disp: 30 each, Rfl: 0 .  Carbidopa-Levodopa ER (SINEMET CR) 25-100 MG tablet controlled release, Take 1 tablet by mouth 2 (two) times daily., Disp: 60 tablet, Rfl: 3 .  fluticasone (FLONASE) 50 MCG/ACT nasal spray,  SHAKE WELL AND USE 1 SPRAY IN EACH NOSTRIL DAILY AS NEEDED FOR ALLERGIES OR RHINITIS, Disp: 48 g, Rfl: 1 .  gabapentin (NEURONTIN) 600 MG tablet, Take 600 mg by mouth 4 (four) times daily - after meals and at bedtime., Disp: , Rfl:  .  meclizine (ANTIVERT) 25 MG tablet, Take one tablet by  mouth once daily as needed for dizziness. (Patient taking differently: Take 25 mg by mouth daily as needed for dizziness. ), Disp: 30 tablet, Rfl: 0 .  oxyCODONE-acetaminophen (PERCOCET/ROXICET) 5-325 MG tablet, Take 1 tablet by mouth every 4 (four) hours as needed for severe pain., Disp: , Rfl:  .  sertraline (ZOLOFT) 100 MG tablet, Take 1 tablet (100 mg total) by mouth daily., Disp: , Rfl:  .  SUMAtriptan (IMITREX) 50 MG tablet, See AUX Label (Patient taking differently: Take 50 mg by mouth as needed for migraine or headache), Disp: 30 tablet, Rfl: 2 .  [DISCONTINUED] potassium chloride 20 MEQ TBCR, Take 20 mEq by mouth daily., Disp: 3 tablet, Rfl: 0     Assessment and plan per attending neurologist  Etta Quill PA-C [facility] [phone]  04/20/2015, 10:51 AM   Assessment/Plan:  74 YO female with history of migraine HA's in past now presenting with recurrent migraine after surgery.  HA likely secondary to stressors of surgery and multiple pain medications.  Recommend Baclofen 5 mg BID, and iv Depacon 125 mg QID. This can be switched to by mouth Depakote 250 twice a day tomorrow if headache is better controlled. Decrease Neurontin to 600 mg TID from QID, to reduce sedative s/e from adding new meds.  Will follow up.    Patient evaluated with physician assistant, Etta Quill. Helped him formulate the plan. Agree with the documented assessment.

## 2015-04-20 NOTE — Evaluation (Signed)
Occupational Therapy Evaluation Patient Details Name: [name] MRN: [id number] DOB: May 21, 1941 Today's Date: 04/20/2015    History of Present Illness Pt is a 74 y/o female who presents s/p L2-L3 lumbar laminectomy on 04/19/15. PMH: Dysrhythmia, neuromuscular disorder, cellulitis, HTN, peripheral neuropathy, cataracts bilatera, depression and anxiety.   Clinical Impression   Pt admitted to hospital due to reason stated above. Pt currently with functional limitiations due to deficits listed below (see OT problem list). Prior to admission pt was independent with ADLs/IADLs. Pt currently requires supervision to min guard assistance for safety with ADLs. Pt was educated in back precautions and was able to recall 1 out of 3 using teach back method. Handout provided to ensure understanding of precautions. Pt became emotional and tearful during session, stated I've been through this a lot and no one knows what is wrong with me". Pt will benefit from skilled OT to increase her independence and safety with ADLs and balance to allow discharge to venue listed below.    Follow Up Recommendations  SNF    Equipment Recommendations  Other (comment) (TBD next venue)    Recommendations for Other Services       Precautions / Restrictions Precautions Precautions: Fall;Back Precaution Booklet Issued: Yes (comment) Precaution Comments: reviwed precaution handout with pt and cued for precuations during ADLs Required Braces or Orthoses: Spinal Brace Spinal Brace: Lumbar corset;Applied in sitting position Restrictions Weight Bearing Restrictions: No      Mobility Bed Mobility Overal bed mobility: Needs Assistance Bed Mobility: Rolling;Sidelying to Sit;Sit to Sidelying Rolling: Supervision Sidelying to sit: Min guard     Sit to sidelying: Min guard General bed mobility comments: min guard for safety, however no physical assistance required for transitioning from EOB.  Transfers Overall  transfer level: Needs assistance Equipment used: Rolling walker (2 wheeled) Transfers: Sit to/from Stand Sit to Stand: Min guard         General transfer comment: min guard for safety, verbal cues for hand placement    Balance Overall balance assessment: Needs assistance Sitting-balance support: No upper extremity supported;Feet supported Sitting balance-Leahy Scale: Fair     Standing balance support: No upper extremity supported;During functional activity Standing balance-Leahy Scale: Poor Standing balance comment: Pt seen leaning on sink to wash hands, verbal cues for upright posture to maintain back precautions, noted slight LOB posteriorly pt able to regain with physical assist                            ADL Overall ADL's : Needs assistance/impaired     Grooming: Wash/dry hands;Min guard;Cueing for safety;Standing Grooming Details (indicate cue type and reason): pt required verbal cues to maintian back precautions while washing hands at sink             Lower Body Dressing: Modified independent;With adaptive equipment;Sitting/lateral leans Lower Body Dressing Details (indicate cue type and reason): able to use sock-aid to don RLE sock Toilet Transfer: Min guard;Cueing for safety;Ambulation;Comfort height toilet   Toileting- Clothing Manipulation and Hygiene: Supervision/safety;Sitting/lateral lean       Functional mobility during ADLs: Min guard;Rolling walker General ADL Comments: Pt educated in adaptive equipment to assist with ADLs and maintaining back precautions. Pt required verbal cues during grooming task, pt seen twisitng while washing hands at sink.     Vision     Perception     Praxis      Pertinent Vitals/Pain Pain Assessment: Faces Faces Pain Scale: Hurts whole  lot Pain Location: Headache Pain Intervention(s): Limited activity within patient's tolerance;Monitored during session;Repositioned     Hand Dominance Right    Extremity/Trunk Assessment Upper Extremity Assessment Upper Extremity Assessment: Overall WFL for tasks assessed   Lower Extremity Assessment Lower Extremity Assessment: Defer to PT evaluation   Cervical / Trunk Assessment Cervical / Trunk Assessment: Kyphotic   Communication Communication Communication: No difficulties   Cognition Arousal/Alertness: Awake/alert Behavior During Therapy: WFL for tasks assessed/performed (tearful at times) Overall Cognitive Status: Within Functional Limits for tasks assessed                     General Comments    Handout provided for back precautions, pt able to recall 1/3 precautions. Pt expressed concern for taking care of cats, pt educated in how to safely care for pets while maintaining back precautions.    Exercises       Shoulder Instructions      Home Living Family/patient expects to be discharged to:: Skilled nursing facility Living Arrangements: Alone                           Home Equipment: Gilford Rile - 2 wheels;Shower seat - built in   Additional Comments: Pt reports having a lift chair that lowers and lifts pt into tub, also has a walk-in shower with shower seat.      Prior Functioning/Environment Level of Independence: Independent        Comments: Pt reports having homehealth OT services for a breifly but she was discharged due to being and independent level for ADLs. Pt reports having over 10 falls since April and at times needed to call EMS for assistance out of floor.    OT Diagnosis: Generalized weakness;Acute pain   OT Problem List: Decreased strength;Decreased activity tolerance;Impaired balance (sitting and/or standing);Decreased knowledge of use of DME or AE;Decreased knowledge of precautions;Pain   OT Treatment/Interventions: Self-care/ADL training;Therapeutic exercise;DME and/or AE instruction;Therapeutic activities;Patient/family education;Balance training    OT Goals(Current goals can be found  in the care plan section) Acute Rehab OT Goals Patient Stated Goal: going home after SNF OT Goal Formulation: With patient Time For Goal Achievement: 05/04/15 Potential to Achieve Goals: Fair ADL Goals Pt Will Perform Grooming: with modified independence;standing Pt Will Perform Lower Body Bathing: with modified independence;with adaptive equipment;sitting/lateral leans Pt Will Perform Lower Body Dressing: with modified independence;with adaptive equipment;sit to/from stand Pt Will Transfer to Toilet: with modified independence;ambulating;grab bars;regular height toilet Additional ADL Goal #1: Pt will verbalize and demonstrate 3/3 back precautions while engaging in ADLs. Additional ADL Goal #2: Pt will be independent in donning and doffing lumbar back brace.  OT Frequency: Min 2X/week   Barriers to D/C:            Co-evaluation PT/OT/SLP Co-Evaluation/Treatment: Yes Reason for Co-Treatment: For patient/therapist safety PT goals addressed during session: Mobility/safety with mobility OT goals addressed during session: ADL's and self-care      End of Session Equipment Utilized During Treatment: Gait belt;Rolling walker  Activity Tolerance: Patient limited by pain Patient left: in bed;with call bell/phone within reach   Time: 1005-1036 OT Time Calculation (min): 31 min Charges:  OT General Charges $OT Visit: 1 Procedure OT Evaluation $Initial OT Evaluation Tier I: 1 Procedure G-Codes:    Lin Landsman 05-19-2015, 12:05 PM

## 2015-04-20 NOTE — Progress Notes (Signed)
Utilization review completed.  

## 2015-04-20 NOTE — Evaluation (Signed)
Physical Therapy Evaluation Patient Details Name: [name] MRN: [id number] DOB: [birth year] Today's Date: 04/20/2015   History of Present Illness  Pt is a 74 y/o female who presents s/p L2-L3 lumbar laminectomy on 04/19/15.  Clinical Impression  Pt admitted with above diagnosis. Pt currently with functional limitations due to the deficits listed below (see PT Problem List). At the time of PT eval pt was able to perform transfers and ambulation with min guard assist and cueing for maintenance of back precautions. Vendor fitted brace during therapy session. Pt will benefit from skilled PT to increase their independence and safety with mobility to allow discharge to the venue listed below.       Follow Up Recommendations SNF;Supervision/Assistance - 24 hour    Equipment Recommendations  None recommended by PT    Recommendations for Other Services       Precautions / Restrictions Precautions Precautions: Fall;Back Precaution Booklet Issued: Yes (comment) Precaution Comments: Reviewed handout with pt and cued for precautions during functional mobility. Required Braces or Orthoses: Spinal Brace Spinal Brace: Lumbar corset;Applied in sitting position Restrictions Weight Bearing Restrictions: No      Mobility  Bed Mobility Overal bed mobility: Needs Assistance Bed Mobility: Rolling;Sidelying to Sit;Sit to Sidelying Rolling: Supervision Sidelying to sit: Min guard     Sit to sidelying: Min guard General bed mobility comments: Close guard for safety but no significant assistance required for transition to/from EOB.   Transfers Overall transfer level: Needs assistance Equipment used: Rolling walker (2 wheeled) Transfers: Sit to/from Stand Sit to Stand: Min guard         General transfer comment: Close guard for safety as pt powered-up to full standing position. VC's for hand placement on seated surface.   Ambulation/Gait Ambulation/Gait assistance: Min  guard Ambulation Distance (Feet): 100 Feet Assistive device: Rolling walker (2 wheeled) Gait Pattern/deviations: Step-through pattern;Decreased stride length;Trunk flexed Gait velocity: Decreased Gait velocity interpretation: Below normal speed for age/gender General Gait Details: Slow and guarded gait. Close guard for safety.   Stairs            Wheelchair Mobility    Modified Rankin (Stroke Patients Only)       Balance Overall balance assessment: Needs assistance Sitting-balance support: Feet supported;No upper extremity supported Sitting balance-Leahy Scale: Fair     Standing balance support: No upper extremity supported;During functional activity Standing balance-Leahy Scale: Poor Standing balance comment: Pt leaning on sink to wash hands. VC's for upright posture and to maintain back precautions, and pt lost balance posteriorly without UE support while drying hands.                              Pertinent Vitals/Pain Pain Assessment: Faces Faces Pain Scale: Hurts whole lot Pain Location: Headache Pain Intervention(s): Limited activity within patient's tolerance;Monitored during session;Repositioned    Home Living Family/patient expects to be discharged to:: Skilled nursing facility Living Arrangements: Alone             Home Equipment: Walker - 2 wheels;Shower seat - built in Brewing technologist of some sort - pt reports into the tub but unsure)      Prior Function Level of Independence: Independent with assistive device(s)         Comments: Using RW for support. Pt states that he has had over 10 falls since April, at times needing to call EMS to help her get up off the floor.  Hand Dominance   Dominant Hand: Right    Extremity/Trunk Assessment   Upper Extremity Assessment: Defer to OT evaluation           Lower Extremity Assessment: Generalized weakness (Reports legs give out on her at times)      Cervical / Trunk Assessment:  Kyphotic  Communication   Communication: No difficulties  Cognition Arousal/Alertness: Awake/alert Behavior During Therapy: WFL for tasks assessed/performed (Tearful at times) Overall Cognitive Status: Within Functional Limits for tasks assessed                      General Comments General comments (skin integrity, edema, etc.): Occasional cues to maintain back precautions during mobility.     Exercises        Assessment/Plan    PT Assessment Patient needs continued PT services  PT Diagnosis Difficulty walking;Acute pain   PT Problem List Decreased strength;Decreased range of motion;Decreased activity tolerance;Decreased balance;Decreased mobility;Decreased knowledge of use of DME;Decreased safety awareness;Decreased knowledge of precautions;Pain  PT Treatment Interventions DME instruction;Gait training;Stair training;Functional mobility training;Therapeutic activities;Therapeutic exercise;Neuromuscular re-education;Patient/family education   PT Goals (Current goals can be found in the Care Plan section) Acute Rehab PT Goals Patient Stated Goal: Home after SNF PT Goal Formulation: With patient Time For Goal Achievement: 04/27/15 Potential to Achieve Goals: Good    Frequency Min 5X/week   Barriers to discharge Decreased caregiver support Pt lives alone    Co-evaluation PT/OT/SLP Co-Evaluation/Treatment: Yes Reason for Co-Treatment: For patient/therapist safety PT goals addressed during session: Mobility/safety with mobility;Balance;Proper use of DME         End of Session Equipment Utilized During Treatment: Back brace;Gait belt Activity Tolerance: Patient limited by pain Patient left: in bed;with call bell/phone within reach Nurse Communication: Mobility status         Time: [id number] PT Time Calculation (min) (ACUTE ONLY): 31 min   Charges:   PT Evaluation $Initial PT Evaluation Tier I: 1 Procedure     PT G CodesRolinda Roan 2015/05/16, 10:55 AM   Rolinda Roan, PT, DPT Acute Rehabilitation Services Pager: [phone]

## 2015-04-20 NOTE — Clinical Social Work Placement (Signed)
   CLINICAL SOCIAL WORK PLACEMENT  NOTE  Date:  04/20/2015  Patient Details  Name: MYRTICE SCAVETTA MRN: [id number] Date of Birth: 1940/12/04  Clinical Social Work is seeking post-discharge placement for this patient at the [place] level of care (*CSW will initial, date and re-position this form in  chart as items are completed):  Yes   Patient/family provided with [place] Work Department's list of facilities offering this level of care within the geographic area requested by the patient (or if unable, by the patient's family).  Yes   Patient/family informed of their freedom to choose among providers that offer the needed level of care, that participate in Medicare, Medicaid or managed care program needed by the patient, have an available bed and are willing to accept the patient.  Yes   Patient/family informed of [place]'s ownership interest in [facility] and [facility], as well as of the fact that they are under no obligation to receive care at these facilities.  PASRR submitted to EDS on 04/20/15     PASRR number received on 04/20/15     Existing PASRR number confirmed on       FL2 transmitted to all facilities in geographic area requested by pt/family on       FL2 transmitted to all facilities within larger geographic area on       Patient informed that his/her managed care company has contracts with or will negotiate with certain facilities, including the following:            Patient/family informed of bed offers received.  Patient chooses bed at       Physician recommends and patient chooses bed at      Patient to be transferred to   on  .  Patient to be transferred to facility by       Patient family notified on   of transfer.  Name of family member notified:        PHYSICIAN Please sign FL2     Additional Comment:    _______________________________________________ Cranford Mon, LCSW 04/20/2015, 4:48  PM

## 2015-04-20 NOTE — Progress Notes (Signed)
Orthopedic Tech Progress Note Patient Details:  AADVIKA ALIAGA 11/10/1940 [id number] Brace order completed by bio-tech vendor. Patient ID: WYLLOW HEMMER, female   DOB: June 03, 1941, 74 y.o.   MRN: [id number]   Braulio Bosch 04/20/2015, 2:44 PM

## 2015-04-20 NOTE — Clinical Social Work Note (Signed)
Clinical Social Work Assessment  Patient Details  Name: [name] MRN: [id number] Date of Birth: 02/14/1941  Date of referral:  04/20/15               Reason for consult:  Facility Placement                Permission sought to share information with:  Chartered certified accountant granted to share information::  Yes, Verbal Permission Granted  Name::        Agency::  SNF's in [facility]  Relationship::     Contact Information:     Housing/Transportation Living arrangements for the past 2 months:  [place] of Information:  Patient, Case Manager Patient Interpreter Needed:  None Criminal Activity/Legal Involvement Pertinent to Current Situation/Hospitalization:  No - Comment as needed Significant Relationships:  Adult Children Lives with:  Self Do you feel safe going back to the place where you live?  No Need for family participation in patient care:  Yes (Comment)  Care giving concerns:  Spoke w/ patient regarding possibility of SNF placement. Patient lives alone and is currently unable to care for herself given patient's current needs.    Social Worker assessment / plan:  Spoke with patient concerning possibility of rehab at SNF before returning home.   Employment status:  Retired Nurse, adult PT Recommendations:  [place] / Referral to community resources:  [place]  Patient/Family's Response to care:  Patient recognizes need for rehab before returning home and is agreeable to a SNF. Patient's first choice is [facility].  Patient/Family's Understanding of and Emotional Response to Diagnosis, Current Treatment, and Prognosis:  Patient is realistic regarding therapy needs. No questions/concerns about plan or treatment.    Emotional Assessment Appearance:  Appears stated age Attitude/Demeanor/Rapport:    Affect (typically observed):  Appropriate,  Quiet Orientation:  Oriented to Self, Oriented to Place, Oriented to  Time, Oriented to Situation Alcohol / Substance use:  Not Applicable Psych involvement (Current and /or in the community):  No (Comment)  Discharge Needs  Concerns to be addressed:  Care Coordination Readmission within the last 30 days:  No Current discharge risk:  None Barriers to Discharge:  Continued Medical Work up   [employer], LCSW 04/20/2015, 1:40 PM

## 2015-04-21 DIAGNOSIS — M6281 Muscle weakness (generalized): Secondary | ICD-10-CM | POA: Diagnosis not present

## 2015-04-21 DIAGNOSIS — F4321 Adjustment disorder with depressed mood: Secondary | ICD-10-CM | POA: Diagnosis not present

## 2015-04-21 DIAGNOSIS — R2681 Unsteadiness on feet: Secondary | ICD-10-CM | POA: Diagnosis not present

## 2015-04-21 DIAGNOSIS — F411 Generalized anxiety disorder: Secondary | ICD-10-CM | POA: Diagnosis not present

## 2015-04-21 DIAGNOSIS — K5909 Other constipation: Secondary | ICD-10-CM | POA: Diagnosis not present

## 2015-04-21 DIAGNOSIS — S82451D Displaced comminuted fracture of shaft of right fibula, subsequent encounter for closed fracture with routine healing: Secondary | ICD-10-CM | POA: Diagnosis not present

## 2015-04-21 DIAGNOSIS — R110 Nausea: Secondary | ICD-10-CM | POA: Diagnosis not present

## 2015-04-21 DIAGNOSIS — G629 Polyneuropathy, unspecified: Secondary | ICD-10-CM | POA: Diagnosis not present

## 2015-04-21 DIAGNOSIS — F332 Major depressive disorder, recurrent severe without psychotic features: Secondary | ICD-10-CM | POA: Diagnosis not present

## 2015-04-21 DIAGNOSIS — M4806 Spinal stenosis, lumbar region: Secondary | ICD-10-CM | POA: Diagnosis not present

## 2015-04-21 DIAGNOSIS — I10 Essential (primary) hypertension: Secondary | ICD-10-CM | POA: Diagnosis not present

## 2015-04-21 DIAGNOSIS — R278 Other lack of coordination: Secondary | ICD-10-CM | POA: Diagnosis not present

## 2015-04-21 DIAGNOSIS — G459 Transient cerebral ischemic attack, unspecified: Secondary | ICD-10-CM | POA: Diagnosis not present

## 2015-04-21 DIAGNOSIS — E785 Hyperlipidemia, unspecified: Secondary | ICD-10-CM | POA: Diagnosis not present

## 2015-04-21 DIAGNOSIS — M62838 Other muscle spasm: Secondary | ICD-10-CM | POA: Diagnosis not present

## 2015-04-21 DIAGNOSIS — G20 Parkinson's disease: Secondary | ICD-10-CM | POA: Diagnosis not present

## 2015-04-21 DIAGNOSIS — M25559 Pain in unspecified hip: Secondary | ICD-10-CM | POA: Diagnosis not present

## 2015-04-21 DIAGNOSIS — R296 Repeated falls: Secondary | ICD-10-CM | POA: Diagnosis not present

## 2015-04-21 DIAGNOSIS — M545 Low back pain: Secondary | ICD-10-CM | POA: Diagnosis not present

## 2015-04-21 DIAGNOSIS — Z4789 Encounter for other orthopedic aftercare: Secondary | ICD-10-CM | POA: Diagnosis not present

## 2015-04-21 DIAGNOSIS — G63 Polyneuropathy in diseases classified elsewhere: Secondary | ICD-10-CM | POA: Diagnosis not present

## 2015-04-21 DIAGNOSIS — E876 Hypokalemia: Secondary | ICD-10-CM | POA: Diagnosis not present

## 2015-04-21 LAB — HEMOGLOBIN A1C
HEMOGLOBIN A1C: 6.6 % — AB (ref 4.8–5.6)
MEAN PLASMA GLUCOSE: 143 mg/dL

## 2015-04-21 MED ORDER — DIVALPROEX SODIUM 125 MG PO CSDR
125.0000 mg | DELAYED_RELEASE_CAPSULE | Freq: Three times a day (TID) | ORAL | Status: DC
  Administered 2015-04-21: 125 mg via ORAL
  Filled 2015-04-21 (×4): qty 1

## 2015-04-21 MED ORDER — DIVALPROEX SODIUM 250 MG PO DR TAB
250.0000 mg | DELAYED_RELEASE_TABLET | Freq: Two times a day (BID) | ORAL | Status: DC
  Filled 2015-04-21: qty 1

## 2015-04-21 MED ORDER — OXYCODONE-ACETAMINOPHEN 5-325 MG PO TABS: 1.0000 | ORAL_TABLET | ORAL | Status: DC | PRN

## 2015-04-21 MED ORDER — CARBIDOPA-LEVODOPA ER 25-100 MG PO TBCR: 1.0000 | EXTENDED_RELEASE_TABLET | Freq: Every day | ORAL | Status: DC

## 2015-04-21 MED ORDER — BACLOFEN 20 MG PO TABS: 20.0000 mg | ORAL_TABLET | Freq: Every day | ORAL | Status: DC

## 2015-04-21 MED ORDER — CARBIDOPA-LEVODOPA ER 25-100 MG PO TBCR
1.0000 | EXTENDED_RELEASE_TABLET | Freq: Two times a day (BID) | ORAL | Status: DC
  Filled 2015-04-21 (×2): qty 1

## 2015-04-21 MED ORDER — CARBIDOPA-LEVODOPA ER 25-100 MG PO TBCR: 1.0000 | EXTENDED_RELEASE_TABLET | Freq: Two times a day (BID) | ORAL | Status: DC

## 2015-04-21 NOTE — Progress Notes (Signed)
Patient alert and oriented, mae's well, voiding adequate amount of urine, swallowing without difficulty, c/o mild pain. Patient discharged to [facility] place for rehab. Script and discharged instructions given to son to delivery to [facility] place staff. Patient and family stated understanding of instructions given. RN called report to [facility] place prior to discharged.

## 2015-04-21 NOTE — Clinical Social Work Placement (Signed)
   CLINICAL SOCIAL WORK PLACEMENT  NOTE  Date:  04/21/2015  Patient Details  Name: [name] MRN: [id number] Date of Birth: Mar 03, 1941  Clinical Social Work is seeking post-discharge placement for this patient at the [place] level of care (*CSW will initial, date and re-position this form in  chart as items are completed):  Yes   Patient/family provided with [place] Work Department's list of facilities offering this level of care within the geographic area requested by the patient (or if unable, by the patient's family).  Yes   Patient/family informed of their freedom to choose among providers that offer the needed level of care, that participate in Medicare, Medicaid or managed care program needed by the patient, have an available bed and are willing to accept the patient.  Yes   Patient/family informed of [place]'s ownership interest in [facility] and [facility], as well as of the fact that they are under no obligation to receive care at these facilities.  PASRR submitted to EDS on 04/20/15     PASRR number received on 04/20/15     Existing PASRR number confirmed on       FL2 transmitted to all facilities in geographic area requested by pt/family on 04/20/15     FL2 transmitted to all facilities within larger geographic area on       Patient informed that his/her managed care company has contracts with or will negotiate with certain facilities, including the following:   [employer])     Yes   Patient/family informed of bed offers received.  Patient chooses bed at [facility]     Physician recommends and patient chooses bed at      Patient to be transferred to [facility] on 04/14/15.  Patient to be transferred to facility by Car with son Konrad Dolores     Patient family notified on 04/21/15 of transfer.  Name of family member notified:  Son:  [name]     PHYSICIAN Please sign FL2     Additional  Comment: Ok per MD for d/c today to SNF for short term rehab.  CSW met with patient and son Konrad Dolores in patient's room. She had preferred to be palced at [facility]- however- they do not have a vacancy today for patient.  Other bed offers provided and patient/son chose [facility] Nursing notified to call report to the  Facility and d/c summary sent to SNF. Son transported via car to facility; patient noted to be able to ambulate fairly well with walker. She requested for son to transport her to facility.   No further CSW needs identified and CSW will sign off.  Lorie Phenix. Murrell Redden [phone]      _______________________________________________ Williemae Area, LCSW 04/21/2015, 8:21 PM

## 2015-04-21 NOTE — Discharge Summary (Signed)
Physician Discharge Summary  Patient ID: [name] MRN: [id number] DOB/AGE: 1940/08/26 74 y.o.  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 Frequent falls Active Problems:   Lumbar stenosis   Discharged Condition: good  Hospital Course: Patient is a 74 year old individual who's had significant problems with weakness in her legs and frequent falls she has had a recent MRI that demonstrate significant stenosis at the level of L2-L3. She was advised regarding surgical decompression and placement of a flex for posterior stabilization. She tolerated the surgery well. During her hospitalization she is seen by a neurologist for consultation regarding her frequent falls. Is decided to change her medications and start to wean her away from the Sinemet. [name] also been placed on a standard dose of baclofen to help with significant back pain and spasm. She'll have outpatient follow-up with neurology after discharge from [facility]. I will see her in follow-up in about 3 weeks for neurosurgical care.  Consults: neurology  Significant Diagnostic Studies: None  Treatments: surgery: Decompression L2-L3 with laminotomies foraminotomies, placement of Coflex  Discharge Exam: Blood pressure 160/68, pulse 79, temperature 99.2 F (37.3 C), temperature source Oral, resp. rate 20, height 5\' 5"  (1.651 m), weight 92.08 kg (203 lb), SpO2 99 %. Incision is clean and dry. Motor function is intact in lower extremities. Station and gait appear intact at this time.  Disposition: Transfer to skilled nursing facility when bed available, [facility]  Discharge Instructions    Call MD for:  redness, tenderness, or signs of infection (pain, swelling, redness, odor or green/yellow discharge around incision site)    Complete  by:  As directed      Call MD for:  severe uncontrolled pain    Complete by:  As directed      Call MD for:  temperature >100.4    Complete by:  As directed      Diet - low sodium heart healthy    Complete by:  As directed      Discharge instructions    Complete by:  As directed   Okay to shower. Do not apply salves or appointments to incision. No heavy lifting with the upper extremities greater than 15 pounds. May resume driving when not requiring pain medication and patient feels comfortable with doing so.     Increase activity slowly    Complete by:  As directed             Medication List    STOP taking these medications        carbidopa-levodopa 25-250 MG tablet  Commonly known as:  SINEMET IR     carbidopa-levodopa 50-200 MG tablet  Commonly known as:  SINEMET CR  Replaced by:  Carbidopa-Levodopa ER 25-100 MG tablet controlled release     gabapentin 600 MG tablet  Commonly known as:  NEURONTIN      TAKE these medications        acetaminophen 325 MG tablet  Commonly known as:  TYLENOL  Take 2 tablets (650 mg total) by mouth every 6 (six) hours as needed for mild pain (or Fever >/= 101).     AFRIN ALLERGY NA  Place 2 sprays into the nose daily as needed (Nasal congestion).     amoxicillin 500 MG capsule  Commonly known as:  AMOXIL  Take 2,000 mg by mouth See admin instructions. Must take 4 capsules  1 hour before dental procedures     aspirin 325 MG tablet  Take 325 mg by mouth daily.     atorvastatin 40 MG tablet  Commonly known as:  LIPITOR  Take one tablet by mouth once daily for cholesterol     baclofen 20 MG tablet  Commonly known as:  LIORESAL  Take 1 tablet (20 mg total) by mouth at bedtime.     calcium carbonate 1250 MG capsule  Take 1,250 mg by mouth daily.     Carbidopa-Levodopa ER 25-100 MG tablet controlled release  Commonly known as:  SINEMET CR  Take 1 tablet by mouth 2 (two) times daily.     cholecalciferol 1000 UNITS tablet  Commonly  known as:  VITAMIN D  Take 1,000 Units by mouth daily.     doxycycline 100 MG tablet  Commonly known as:  VIBRA-TABS  Take one tablet by mouth twice daily for infection     DULoxetine 30 MG capsule  Commonly known as:  CYMBALTA  Take one tablet by mouth twice daily for ONE week; then take one tablet once daily for ONE week     fluticasone 50 MCG/ACT nasal spray  Commonly known as:  FLONASE  SHAKE WELL AND USE 1 SPRAY IN EACH NOSTRIL DAILY AS NEEDED FOR ALLERGIES OR RHINITIS     hydrochlorothiazide 25 MG tablet  Commonly known as:  HYDRODIURIL  Take 1 tablet by mouth in  the morning     HYDROcodone-acetaminophen 5-325 MG tablet  Commonly known as:  NORCO/VICODIN  Take 1 tablet by mouth every 6 (six) hours as needed (Must last 28 days).     LORazepam 1 MG tablet  Commonly known as:  ATIVAN  Take 1 tablet (1 mg total) by mouth every 8 (eight) hours as needed for anxiety.     meclizine 25 MG tablet  Commonly known as:  ANTIVERT  Take one tablet by mouth once daily as needed for dizziness.     nystatin 100000 UNIT/GM Powd  Apply beneath left breast for yeast rash     oxyCODONE-acetaminophen 5-325 MG tablet  Commonly known as:  PERCOCET/ROXICET  Take 1 tablet by mouth every 4 (four) hours as needed.     oxyCODONE-acetaminophen 5-325 MG tablet  Commonly known as:  PERCOCET/ROXICET  Take 1 tablet by mouth every 4 (four) hours as needed for moderate pain.     potassium chloride SA 20 MEQ tablet  Commonly known as:  K-DUR,KLOR-CON  Take two tablet daily for potassium     pyridOXINE 50 MG tablet  Commonly known as:  VITAMIN B-6  Take 50 mg by mouth daily.     sertraline 50 MG tablet  Commonly known as:  ZOLOFT  Take 1/2 (25mg ) tablet by mouth for one week, then take one tablet by mouth for one week then Take two tablets by mouth once daily     sertraline 50 MG tablet  Commonly known as:  ZOLOFT  Take two tablets by mouth once daily for depression     SUMAtriptan 50 MG  tablet  Commonly known as:  IMITREX  See AUX Label     temazepam 7.5 MG capsule  Commonly known as:  RESTORIL  Take 1 capsule (7.5 mg total) by mouth at bedtime as needed for sleep.         SignedEarleen Newport 04/21/2015, 9:46 AM

## 2015-04-21 NOTE — Progress Notes (Signed)
Patient ID: [name], female   DOB: 08/18/1940, 74 y.o.   MRN: [id number] Vital signs are stable Patient is feeling much better today [name] mobilizing better Appreciate help of neurology Hopefully medication changes will help with patient's falling episode FL 2 signed in preparation for transfer to skilled nursing facility Awaiting bed from [facility]

## 2015-04-21 NOTE — Progress Notes (Signed)
OT Cancellation Note  Patient Details Name: AMILAH KOPELMAN MRN: [id number] DOB: 03/06/1941   Cancelled Treatment:    Reason Eval/Treat Not Completed: Patient declined, no reason specified. Pt refused OT services today, stating "I im in too much pain and I will be receiving Occupational Therapy at assisted living I am going to". Second attempt on trying to see pt today.   Lin Landsman 04/21/2015, 12:20 PM

## 2015-04-21 NOTE — Progress Notes (Signed)
Physical Therapy Treatment Patient Details Name: [name] MRN: [id number] DOB: Oct 21, 1940 Today's Date: 04/21/2015    History of Present Illness Pt is a 74 y/o female who presents s/p L2-L3 lumbar laminectomy on 04/19/15. PMH: Dysrhythmia, neuromuscular disorder, cellulitis, HTN, peripheral neuropathy, cataracts bilatera, depression and anxiety.    PT Comments    Pt progressing towards physical therapy goals. Was able to progress ambulation distance this session without significant complaint of increased pain. D/c plans for SNF continue to be appropriate as pt lives alone. Will continue to follow.   Follow Up Recommendations  SNF;Supervision/Assistance - 24 hour     Equipment Recommendations  None recommended by PT    Recommendations for Other Services       Precautions / Restrictions Precautions Precautions: Fall;Back Precaution Booklet Issued: Yes (comment) Precaution Comments: Reviewed back precautions during functional mobility Required Braces or Orthoses: Spinal Brace Spinal Brace: Lumbar corset;Applied in sitting position Restrictions Weight Bearing Restrictions: No    Mobility  Bed Mobility Overal bed mobility: Needs Assistance Bed Mobility: Rolling;Sidelying to Sit Rolling: Supervision Sidelying to sit: Min guard       General bed mobility comments: Close guard for safety but no assist to elevate trunk to full sitting position.   Transfers Overall transfer level: Needs assistance Equipment used: Rolling walker (2 wheeled) Transfers: Sit to/from Stand Sit to Stand: Min guard         General transfer comment: Close guard for safety as pt powered-up to full standing position. VC's for hand placement on seated surface.   Ambulation/Gait Ambulation/Gait assistance: Min guard Ambulation Distance (Feet): 200 Feet Assistive device: Rolling walker (2 wheeled) Gait Pattern/deviations: Step-through pattern;Decreased stride length;Trunk flexed Gait  velocity: Decreased Gait velocity interpretation: Below normal speed for age/gender General Gait Details: VC's for improved posture and walker management. Close guard for safety but pt fairly steady.    Stairs            Wheelchair Mobility    Modified Rankin (Stroke Patients Only)       Balance Overall balance assessment: Needs assistance Sitting-balance support: Feet supported;No upper extremity supported Sitting balance-Leahy Scale: Fair     Standing balance support: No upper extremity supported;During functional activity Standing balance-Leahy Scale: Poor Standing balance comment: Requires UE support on walker for dynamic balance.                     Cognition Arousal/Alertness: Lethargic Behavior During Therapy: WFL for tasks assessed/performed Overall Cognitive Status: Within Functional Limits for tasks assessed                      Exercises      General Comments        Pertinent Vitals/Pain Pain Assessment: 0-10 Pain Score: 5  Pain Location: Back Pain Descriptors / Indicators: Operative site guarding;Discomfort Pain Intervention(s): Limited activity within patient's tolerance;Monitored during session;Repositioned    Home Living                      Prior Function            PT Goals (current goals can now be found in the care plan section) Acute Rehab PT Goals Patient Stated Goal: going home after SNF PT Goal Formulation: With patient Time For Goal Achievement: 04/27/15 Potential to Achieve Goals: Good Progress towards PT goals: Progressing toward goals    Frequency  Min 5X/week    PT Plan Current plan remains appropriate  Co-evaluation             End of Session Equipment Utilized During Treatment: Gait belt;Back brace Activity Tolerance: Patient tolerated treatment well Patient left: in chair;with call bell/phone within reach     Time: 0813-0833 PT Time Calculation (min) (ACUTE ONLY): 20  min  Charges:  $Gait Training: 8-22 mins                    G Codes:      Rolinda Roan 04/24/15, 9:12 AM   Rolinda Roan, PT, DPT Acute Rehabilitation Services Pager: [phone]

## 2015-04-22 ENCOUNTER — Non-Acute Institutional Stay (SKILLED_NURSING_FACILITY): Payer: Medicare Other | Admitting: Nurse Practitioner

## 2015-04-22 ENCOUNTER — Other Ambulatory Visit: Payer: Self-pay

## 2015-04-22 DIAGNOSIS — M48061 Spinal stenosis, lumbar region without neurogenic claudication: Secondary | ICD-10-CM

## 2015-04-22 DIAGNOSIS — F332 Major depressive disorder, recurrent severe without psychotic features: Secondary | ICD-10-CM

## 2015-04-22 DIAGNOSIS — K5909 Other constipation: Secondary | ICD-10-CM | POA: Diagnosis not present

## 2015-04-22 DIAGNOSIS — G629 Polyneuropathy, unspecified: Secondary | ICD-10-CM

## 2015-04-22 DIAGNOSIS — I10 Essential (primary) hypertension: Secondary | ICD-10-CM | POA: Diagnosis not present

## 2015-04-22 DIAGNOSIS — M4806 Spinal stenosis, lumbar region: Secondary | ICD-10-CM | POA: Diagnosis not present

## 2015-04-22 MED ORDER — SERTRALINE HCL 100 MG PO TABS: 100.0000 mg | ORAL_TABLET | Freq: Every day | ORAL | Status: DC

## 2015-04-22 MED ORDER — HYDROCODONE-ACETAMINOPHEN 5-325 MG PO TABS: ORAL_TABLET | ORAL | Status: DC

## 2015-04-22 NOTE — Progress Notes (Signed)
Patient ID: [name], female   DOB: November 02, 1940, 74 y.o.   MRN: [id number]    Nursing Home Location:  [place] of Service: SNF (31)  PCP: [name], [name], DO  Allergies  Allergen Reactions  . Contrast Media [Iodinated Diagnostic Agents] Other (See Comments)    Respiratory failure  . Sulfa Drugs Cross Reactors Nausea And Vomiting    Chief Complaint  Patient presents with  . Hospitalization Follow-up    HPI:  Patient is a 74 y.o. female seen today at [facility] for hospitalization follow up.  She has PMH of HTN, TIA, GERD, polyneuropathy, obesity. Pt at [place] [facility] after hospitalization from 04/19/15-04/21/15 with lumbar stenosis L2-3 with radiculopathy and neurogenic claudication causing leg weakness and frequent falls. She underwent lumbar decompression with laminotomies and had placement of a coflex.among others. Pt reports her pain is not under control. Was taking percocet in the hospital with adequate control and currently on Norco.   Review of Systems:  Review of Systems  Constitutional: Negative for activity change, appetite change, fatigue and unexpected weight change.  HENT: Negative for congestion.   Eyes: Negative.   Respiratory: Negative for cough and shortness of breath.   Cardiovascular: Negative for chest pain, palpitations and leg swelling.  Gastrointestinal: Negative for abdominal pain, diarrhea and constipation.  Genitourinary: Negative for dysuria and difficulty urinating.  Musculoskeletal: Positive for myalgias, back pain and arthralgias.  Skin: Negative for color change.  Neurological: Positive for weakness. Negative for dizziness.  Psychiatric/Behavioral: Negative for behavioral problems, confusion and agitation.    Past Medical History  Diagnosis Date  . GERD (gastroesophageal reflux disease)     hx pud '98  . Arthritis     djd  . Restless leg syndrome     takes Sinemet daily  . PPD positive,  treated 1987    tx'd x 1 yr w/ inh  . Dysrhythmia     hx tachy palpitations  . Neuromuscular disorder ([place])     peripheral neuropathy, FEET AND LEGS  . Stroke [facility])     ? 6 months ago - tests okay - [facility]   . Cellulitis     10/11/11 hospitalized for cellulitis   . Hyperlipemia     takes Atorvastatin daily  . Peripheral neuropathy ([place])   . Elbow fracture, left April 2015  . Depression     takes Zoloft daily  . Vertigo     takes Meclizine daily as needed  . Insomnia     takes restoril nightly as needed  . Hypertension     takes HCTZ daily  . Peripheral neuropathy (HCC)     takes Gabapentin daily  . History of bronchitis     many yrs ago  . Headache(784.0)     migraines yrs ago-takes Imitrex daily  . Joint pain   . Joint swelling   . Chronic back pain     scoliosis/stenosis  . Anxiety     takes Ativan daily as needed  . Cataracts, bilateral     immature    Past Surgical History  Procedure Laterality Date  . Cervical disc surgery x2  2011  . Rt carotid enarterectomy  2011  . Rt knee arthroscopy  '99  . Left knee arthroscopy  2001  . Hematoma evacuation  2011    s/p rt cea  . Joint replacement  '01    total knee replacement, LEFT  . Abdominal hysterectomy  1975  bil oophorectomy  . Back surgery   MANY YRS AGO    laminectomy x3  . Cholecystectomy  1993  . Total knee arthroplasty  09/13/2011    Procedure: TOTAL KNEE ARTHROPLASTY;  Surgeon: Magnus Sinning, MD;  Location: [facility];  Service: Orthopedics;  Laterality: Right;  . Right knee replacement      09/2011   . Carpal tunnel release  07/09/2012    Procedure: CARPAL TUNNEL RELEASE;  Surgeon: Magnus Sinning, MD;  Location: [facility];  Service: Orthopedics;  Laterality: Left;  . Finger arthroplasty  07/09/2012    Procedure: FINGER ARTHROPLASTY;  Surgeon: Magnus Sinning, MD;  Location: [facility];  Service: Orthopedics;  Laterality: Left;  Interposition Arthroplasty [facility] Joint Thumb Left   . Lipoma excision  07/09/2012     Procedure: EXCISION LIPOMA;  Surgeon: Magnus Sinning, MD;  Location: [facility];  Service: Orthopedics;  Laterality: Left;  Excision Lipoma Dorsum Left Wrist   . Orif ankle fracture Right 09/14/2014    FIBULA   . Orif ankle fracture Right 09/14/2014    Procedure: OPEN REDUCTION INTERNAL FIXATION (ORIF) RIGHT ANKLE FRACTURE/SYNDESMOSIS ;  Surgeon: Wylene Simmer, MD;  Location: [place];  Service: Orthopedics;  Laterality: Right;  . Colonosocpy    . Esophagogastroduodenoscopy    . Lumbar laminectomy with coflex 1 level Bilateral 04/19/2015    Procedure: LUMBAR TWO-THREE LUMBAR LAMINECTOMY WITH COFLEX ;  Surgeon: Kristeen Miss, MD;  Location: [place] [facility];  Service: Neurosurgery;  Laterality: Bilateral;  Bilateral L23 laminectomy and foraminotomy with coflex   Social History:   reports that she has never smoked. She has never used smokeless tobacco. She reports that she does not drink alcohol or use illicit drugs.  Family History  Problem Relation Age of Onset  . Congestive Heart Failure Father   . Congestive Heart Failure Sister   . Alzheimer's disease Mother   . Diabetes Brother   . Arthritis Brother     Medications: Patient's Medications  New Prescriptions   No medications on file  Previous Medications   ACETAMINOPHEN (TYLENOL) 325 MG TABLET    Take 2 tablets (650 mg total) by mouth every 6 (six) hours as needed for mild pain (or Fever >/= 101).   AMOXICILLIN (AMOXIL) 500 MG CAPSULE    Take 2,000 mg by mouth See admin instructions. Must take 4 capsules 1 hour before dental procedures   ASPIRIN 325 MG TABLET    Take 325 mg by mouth daily.   ATORVASTATIN (LIPITOR) 40 MG TABLET    Take one tablet by mouth once daily for cholesterol   BACLOFEN (LIORESAL) 20 MG TABLET    Take 1 tablet (20 mg total) by mouth at bedtime.   CALCIUM CARBONATE 1250 MG CAPSULE    Take 1,250 mg by mouth daily.    CARBIDOPA-LEVODOPA ER (SINEMET CR) 25-100 MG TABLET CONTROLLED RELEASE    Take 1 tablet by mouth 2 (two)  times daily.   CHOLECALCIFEROL (VITAMIN D) 1000 UNITS TABLET    Take 1,000 Units by mouth daily.   FLUTICASONE (FLONASE) 50 MCG/ACT NASAL SPRAY    SHAKE WELL AND USE 1 SPRAY IN EACH NOSTRIL DAILY AS NEEDED FOR ALLERGIES OR RHINITIS   HYDROCHLOROTHIAZIDE (HYDRODIURIL) 25 MG TABLET    Take 1 tablet by mouth in  the morning   HYDROCODONE-ACETAMINOPHEN (NORCO/VICODIN) 5-325 MG TABLET    1 by mouth every 6 hours as needed  DO NOT EXCEED 4GM OF APAP IN 24 HOURS FROM ALL  SOURCES   LORAZEPAM (ATIVAN) 1 MG TABLET    Take 1 tablet (1 mg total) by mouth every 8 (eight) hours as needed for anxiety.   MECLIZINE (ANTIVERT) 25 MG TABLET    Take one tablet by mouth once daily as needed for dizziness.   OXYCODONE-ACETAMINOPHEN (PERCOCET/ROXICET) 5-325 MG TABLET    Take 1 tablet by mouth every 4 (four) hours as needed for moderate pain.   PHENYLEPHRINE HCL (AFRIN ALLERGY NA)    Place 2 sprays into the nose daily as needed (Nasal congestion).   POTASSIUM CHLORIDE SA (K-DUR,KLOR-CON) 20 MEQ TABLET    Take two tablet daily for potassium   PYRIDOXINE (VITAMIN B-6) 50 MG TABLET    Take 50 mg by mouth daily.   SUMATRIPTAN (IMITREX) 50 MG TABLET    See AUX Label   TEMAZEPAM (RESTORIL) 7.5 MG CAPSULE    Take 1 capsule (7.5 mg total) by mouth at bedtime as needed for sleep.  Modified Medications   Modified Medication Previous Medication   SERTRALINE (ZOLOFT) 100 MG TABLET sertraline (ZOLOFT) 50 MG tablet      Take 1 tablet (100 mg total) by mouth daily.    Take two tablets by mouth once daily for depression  Discontinued Medications   DOXYCYCLINE (VIBRA-TABS) 100 MG TABLET    Take one tablet by mouth twice daily for infection   DULOXETINE (CYMBALTA) 30 MG CAPSULE    Take one tablet by mouth twice daily for ONE week; then take one tablet once daily for ONE week   NYSTATIN (MYCOSTATIN/NYSTOP) 100000 UNIT/GM POWD    Apply beneath left breast for yeast rash   OXYCODONE-ACETAMINOPHEN (PERCOCET/ROXICET) 5-325 MG TABLET    Take  1 tablet by mouth every 4 (four) hours as needed.   SERTRALINE (ZOLOFT) 50 MG TABLET    Take 1/2 (25mg ) tablet by mouth for one week, then take one tablet by mouth for one week then Take two tablets by mouth once daily     Physical Exam: Filed Vitals:   04/22/15 1243  BP: 148/78  Pulse: 94  Temp: 97.9 F (36.6 C)  Resp: 20    Physical Exam  Constitutional: [name] oriented to person, place, and time. She appears well-developed and well-nourished. No distress.  HENT:  Head: Normocephalic and atraumatic.  Mouth/Throat: Oropharynx is clear and moist. No oropharyngeal exudate.  Neck: Normal range of motion. Neck supple.  Cardiovascular: Normal rate, regular rhythm and normal heart sounds.   Pulmonary/Chest: Effort normal and breath sounds normal.  Abdominal: Soft. Bowel sounds are normal.  Musculoskeletal: She exhibits tenderness (to lumbar spine, incision noted without signs of infection). She exhibits no edema.  Neurological: [name] alert and oriented to person, place, and time.  Skin: Skin is warm and dry. [name] not diaphoretic.  Psychiatric: She has a normal mood and affect.    Labs reviewed: Basic Metabolic Panel:  Recent Labs  09/19/14 0619  12/09/14 1249 04/19/15 1102 04/20/15 1325  NA 137  < > 136 137 137  K 2.7*  < > 3.4* 3.9 3.7  CL 92*  < > 88* 101 99*  CO2 33*  < > 25 27 29   GLUCOSE 150*  < > 109* 110* 159*  BUN 10  < > 27 11 15   CREATININE 0.77  < > 1.17* 1.07* 1.08*  CALCIUM 9.6  < > 9.5 9.3 9.2  MG 1.7  --   --   --   --   < > =  values in this interval not displayed. Liver Function Tests:  Recent Labs  08/07/14 0614 04/20/15 1325  AST 22 28  ALT <5 <5*  ALKPHOS 80 107  BILITOT 1.0 0.4  PROT 8.2 6.2*  ALBUMIN 4.4 2.9*   No results for input(s): LIPASE, AMYLASE in the last 8760 hours. No results for input(s): AMMONIA in the last 8760 hours. CBC:  Recent Labs  08/07/14 0614 09/14/14 1234 09/17/14 0500 11/11/14 1135 04/19/15 1102  WBC  10.1 11.3* 10.7* 7.5 9.9  NEUTROABS 6.6  --   --  3.7  --   HGB 14.2 11.9* 10.4*  --  12.6  HCT 43.5 37.7 33.5* 39.8 40.4  MCV 94.6 95.0 97.4  --  98.3  PLT 356 348 266  --  287   TSH:  Recent Labs  04/20/15 1325  TSH 0.890   A1C: Lab Results  Component Value Date   HGBA1C 6.6* 04/20/2015   Lipid Panel:  Recent Labs  11/11/14 1135  CHOL 222*  HDL 39*  LDLCALC 110*  TRIG 364*  CHOLHDL 5.7*    Radiological Exams: Dg Foot Complete Right  04/09/2015  CLINICAL DATA:  74 year old female with history of trauma from a fall complaining of right foot pain anteriorly. EXAM: RIGHT FOOT COMPLETE - 3+ VIEW COMPARISON:  Right ankle radiograph 08/21/2014. FINDINGS: Three views of the right foot demonstrate no acute displaced fracture, subluxation or dislocation. Posttraumatic deformity of the distal fibula with a lateral plate and screw fixation device in place, and some heterotopic ossification adjacent to the tibiotalar joint. Mild diffuse soft tissue swelling. IMPRESSION: 1. No acute radiographic abnormality of the right foot. Electronically Signed   By: Vinnie Langton M.D.   On: 04/09/2015 16:55    Assessment/Plan 1. Lumbar stenosis S/p surgical decompression and placement of a flex for posterior stabilization, pain is not controlled. Pt was on hydrocodone prior to surgery and placed on oxycodone/apap in hospital with adequate relief, currently on hydrocodone q 6 which is not controlling pain. Will dc hydrocodone/apap and start oxycodone/apap 5-325 mg PO q 4 hours PRN pain   2. Major depressive disorder, recurrent severe without psychotic features ([place]) -weaned off cymbalta and placed on zoloft. Mood has been stable  3. Other constipation Well controlled on current regimen. Bowels moving regularly   4. Essential hypertension Stable, conts on HCTZ and potassium supplement  5. Neuropathy (HCC) -remains stable   6. Hyperlipidemia conts on lipitor       Hoyt Leanos K.  Harle Battiest  [facility] [phone] 8 am - 5 pm) [phone] (after hours)

## 2015-04-22 NOTE — Telephone Encounter (Signed)
Rx faxed to [facility] @ [phone], phone number [phone]  

## 2015-04-26 ENCOUNTER — Non-Acute Institutional Stay (SKILLED_NURSING_FACILITY): Payer: Medicare Other | Admitting: Internal Medicine

## 2015-04-26 DIAGNOSIS — K5909 Other constipation: Secondary | ICD-10-CM | POA: Diagnosis not present

## 2015-04-26 DIAGNOSIS — F411 Generalized anxiety disorder: Secondary | ICD-10-CM | POA: Diagnosis not present

## 2015-04-26 DIAGNOSIS — I10 Essential (primary) hypertension: Secondary | ICD-10-CM | POA: Diagnosis not present

## 2015-04-26 DIAGNOSIS — M48061 Spinal stenosis, lumbar region without neurogenic claudication: Secondary | ICD-10-CM

## 2015-04-26 DIAGNOSIS — G63 Polyneuropathy in diseases classified elsewhere: Secondary | ICD-10-CM

## 2015-04-26 DIAGNOSIS — G459 Transient cerebral ischemic attack, unspecified: Secondary | ICD-10-CM

## 2015-04-26 DIAGNOSIS — G20 Parkinson's disease: Secondary | ICD-10-CM | POA: Diagnosis not present

## 2015-04-26 DIAGNOSIS — G43909 Migraine, unspecified, not intractable, without status migrainosus: Secondary | ICD-10-CM

## 2015-04-26 DIAGNOSIS — G4700 Insomnia, unspecified: Secondary | ICD-10-CM

## 2015-04-26 DIAGNOSIS — M4806 Spinal stenosis, lumbar region: Secondary | ICD-10-CM

## 2015-04-26 DIAGNOSIS — G20A1 Parkinson's disease without dyskinesia, without mention of fluctuations: Secondary | ICD-10-CM

## 2015-04-26 NOTE — Progress Notes (Signed)
Patient ID: [name], female   DOB: 10-Sep-1940, 74 y.o.   MRN: [id number]     Facility: [facility]    PCP: Hollace Kinnier, DO  Code Status: full code  Allergies  Allergen Reactions  . Contrast Media [Iodinated Diagnostic Agents] Other (See Comments)    Respiratory failure  . Sulfa Drugs Cross Reactors Nausea And Vomiting    Chief Complaint  Patient presents with  . New Admit To SNF     HPI:  74 y.o. 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 She underwent lumbar decompression with laminotomies and had placement of a coflex. She was seen by neurology team and weaning of sinemet was started. She has PMH of HTN, TIA, GERD, polyneuropathy, obesity among others. [name] seen in her room today. She complaints of occasional headache. Her pain is under control with current regimen. She would like to be started on stool softner.   Review of Systems:  Constitutional: Negative for fever, chills, diaphoresis.  HENT: Negative for headache, congestion, nasal discharge, difficulty swallowing.   Eyes: Negative for eye pain, blurred vision, double vision and discharge.  Respiratory: Negative for cough, shortness of breath and wheezing.   Cardiovascular: Negative for chest pain, palpitations, leg swelling.  Gastrointestinal: Negative for heartburn, nausea, vomiting, abdominal pain. Had bowel movement yesterday Genitourinary: Negative for dysuria, flank pain.  Musculoskeletal: Negative for falls in the facility. Skin: Negative for itching, rash.  Neurological: Negative for dizziness, has peripheral neuropathy Psychiatric/Behavioral: Negative for depression, anxiety, insomnia and memory loss.    Past Medical History  Diagnosis Date  . GERD (gastroesophageal reflux disease)     hx pud '98  . Arthritis      djd  . Restless leg syndrome     takes Sinemet daily  . 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 Dysrhythmia     hx tachy palpitations  . Neuromuscular disorder ([place])     peripheral neuropathy, FEET AND LEGS  . Stroke [facility])     ? 6 months ago - tests okay - [facility]   . Cellulitis     10/11/11 hospitalized for cellulitis   . Hyperlipemia     takes Atorvastatin daily  . Peripheral neuropathy ([place])   . Elbow fracture, left April 2015  . Depression     takes Zoloft daily  . Vertigo     takes Meclizine daily as needed  . Insomnia     takes restoril nightly as needed  . Hypertension     takes HCTZ daily  . Peripheral neuropathy (H[facility])     takes Gabapentin daily  . History of bronchitis     many yrs ago  . Headache(784.0)     migraines yrs ago-takes Imitrex daily  . Joint pain   . Joint swelling   . Chronic back pain     scoliosis/stenosis  . Anxiety     takes Ativan daily as needed  . Cataracts, bilateral     immature    Past Surgical History  Procedure Laterality Date  . Cervical disc surgery x2  2011  . Rt carotid enarterectomy  2011  . Rt knee arthroscopy  '99  . Left knee arthroscopy  2001  . Hematoma evacuation  2011    s/p rt cea  . Joint replacement  '01  total knee replacement, LEFT  . Abdominal hysterectomy  1975    bil oophorectomy  . Back surgery   MANY YRS AGO    laminectomy x3  . Cholecystectomy  1993  . Total knee arthroplasty  09/13/2011    Procedure: TOTAL KNEE ARTHROPLASTY;  Surgeon: Magnus Sinning, MD;  Location: [facility];  Service: Orthopedics;  Laterality: Right;  . Right knee replacement      09/2011   . Carpal tunnel release  07/09/2012    Procedure: CARPAL TUNNEL RELEASE;  Surgeon: Magnus Sinning, MD;  Location: [facility];  Service: Orthopedics;  Laterality: Left;  . Finger arthroplasty  07/09/2012    Procedure: FINGER ARTHROPLASTY;  Surgeon: Magnus Sinning, MD;  Location: [facility];  Service: Orthopedics;  Laterality: Left;   Interposition Arthroplasty [facility] Joint Thumb Left   . Lipoma excision  07/09/2012    Procedure: EXCISION LIPOMA;  Surgeon: Magnus Sinning, MD;  Location: [facility];  Service: Orthopedics;  Laterality: Left;  Excision Lipoma Dorsum Left Wrist   . Orif ankle fracture Right 09/14/2014    FIBULA   . Orif ankle fracture Right 09/14/2014    Procedure: OPEN REDUCTION INTERNAL FIXATION (ORIF) RIGHT ANKLE FRACTURE/SYNDESMOSIS ;  Surgeon: Wylene Simmer, MD;  Location: [place];  Service: Orthopedics;  Laterality: Right;  . Colonosocpy    . Esophagogastroduodenoscopy    . Lumbar laminectomy with coflex 1 level Bilateral 04/19/2015    Procedure: LUMBAR TWO-THREE LUMBAR LAMINECTOMY WITH COFLEX ;  Surgeon: Kristeen Miss, MD;  Location: [place] [facility];  Service: Neurosurgery;  Laterality: Bilateral;  Bilateral L23 laminectomy and foraminotomy with coflex   Social History:   reports that she has never smoked. She has never used smokeless tobacco. She reports that she does not drink alcohol or use illicit drugs.  Family History  Problem Relation Age of Onset  . Congestive Heart Failure Father   . Congestive Heart Failure Sister   . Alzheimer's disease Mother   . Diabetes Brother   . Arthritis Brother     Medications:   Medication List       This list is accurate as of: 04/26/15  8:22 AM.  Always use your most recent med list.               acetaminophen 325 MG tablet  Commonly known as:  TYLENOL  Take 2 tablets (650 mg total) by mouth every 6 (six) hours as needed for mild pain (or Fever >/= 101).     AFRIN ALLERGY NA  Place 2 sprays into the nose daily as needed (Nasal congestion).     amoxicillin 500 MG capsule  Commonly known as:  AMOXIL  Take 2,000 mg by mouth See admin instructions. Must take 4 capsules 1 hour before dental procedures     aspirin 325 MG tablet  Take 325 mg by mouth daily.     atorvastatin 40 MG tablet  Commonly known as:  LIPITOR  Take one tablet by mouth once daily for  cholesterol     baclofen 20 MG tablet  Commonly known as:  LIORESAL  Take 1 tablet (20 mg total) by mouth at bedtime.     calcium carbonate 1250 MG capsule  Take 1,250 mg by mouth daily.     Carbidopa-Levodopa ER 25-100 MG tablet controlled release  Commonly known as:  SINEMET CR  Take 1 tablet by mouth 2 (two) times daily.     cholecalciferol 1000 UNITS tablet  Commonly known as:  VITAMIN D  Take 1,000 Units by mouth daily.     fluticasone 50 MCG/ACT nasal spray  Commonly known as:  FLONASE  SHAKE WELL AND USE 1 SPRAY IN EACH NOSTRIL DAILY AS NEEDED FOR ALLERGIES OR RHINITIS     gabapentin 600 MG tablet  Commonly known as:  NEURONTIN  Take 600 mg by mouth 4 (four) times daily - after meals and at bedtime.     hydrochlorothiazide 25 MG tablet  Commonly known as:  HYDRODIURIL  Take 1 tablet by mouth in  the morning     LORazepam 1 MG tablet  Commonly known as:  ATIVAN  Take 1 tablet (1 mg total) by mouth every 8 (eight) hours as needed for anxiety.     meclizine 25 MG tablet  Commonly known as:  ANTIVERT  Take one tablet by mouth once daily as needed for dizziness.     oxyCODONE-acetaminophen 5-325 MG tablet  Commonly known as:  PERCOCET/ROXICET  Take 1 tablet by mouth every 4 (four) hours as needed for severe pain.     potassium chloride SA 20 MEQ tablet  Commonly known as:  K-DUR,KLOR-CON  Take two tablet daily for potassium     pyridOXINE 50 MG tablet  Commonly known as:  VITAMIN B-6  Take 50 mg by mouth daily.     sertraline 100 MG tablet  Commonly known as:  ZOLOFT  Take 1 tablet (100 mg total) by mouth daily.     SUMAtriptan 50 MG tablet  Commonly known as:  IMITREX  See AUX Label     temazepam 7.5 MG capsule  Commonly known as:  RESTORIL  Take 1 capsule (7.5 mg total) by mouth at bedtime as needed for sleep.         Physical Exam: Filed Vitals:   04/26/15 0819  BP: 124/60  Pulse: 88  Temp: 98.3 F (36.8 C)  Resp: 18  SpO2: 94%     General- elderly female, obese, in no acute distress Head- normocephalic, atraumatic Nose- normal nasal mucosa, no maxillary or frontal sinus tenderness, no nasal discharge Throat- moist mucus membrane Eyes- PERRLA, EOMI, no pallor, no icterus, no discharge, normal conjunctiva, normal sclera Neck- no cervical lymphadenopathy Cardiovascular- normal s1,s2, no murmurs, palpable dorsalis pedis and radial pulses, trace leg edema Respiratory- bilateral clear to auscultation, no wheeze, no rhonchi, no crackles, no use of accessory muscles Abdomen- bowel sounds present, soft, non tender Musculoskeletal- able to move all 4 extremities, generalized weakness  Neurological- no focal deficit, alert and oriented to person, place and time Skin- warm and dry, surgical incision on her back healing well with glue Psychiatry- normal mood and affect    Labs reviewed: Basic Metabolic Panel:  Recent Labs  09/19/14 0619  12/09/14 1249 04/19/15 1102 04/20/15 1325  NA 137  < > 136 137 137  K 2.7*  < > 3.4* 3.9 3.7  CL 92*  < > 88* 101 99*  CO2 33*  < > 25 27 29   GLUCOSE 150*  < > 109* 110* 159*  BUN 10  < > 27 11 15   CREATININE 0.77  < > 1.17* 1.07* 1.08*  CALCIUM 9.6  < > 9.5 9.3 9.2  MG 1.7  --   --   --   --   < > = values in this interval not displayed. Liver Function Tests:  Recent Labs  08/07/14 0614 04/20/15 1325  AST 22 28  ALT <5 <5*  ALKPHOS 80 107  BILITOT  1.0 0.4  PROT 8.2 6.2*  ALBUMIN 4.4 2.9*   No results for input(s): LIPASE, AMYLASE in the last 8760 hours. No results for input(s): AMMONIA in the last 8760 hours. CBC:  Recent Labs  08/07/14 0614 09/14/14 1234 09/17/14 0500 11/11/14 1135 04/19/15 1102  WBC 10.1 11.3* 10.7* 7.5 9.9  NEUTROABS 6.6  --   --  3.7  --   HGB 14.2 11.9* 10.4*  --  12.6  HCT 43.5 37.7 33.5* 39.8 40.4  MCV 94.6 95.0 97.4  --  98.3  PLT 356 348 266  --  287   Cardiac Enzymes: No results for input(s): CKTOTAL, CKMB, CKMBINDEX,  TROPONINI in the last 8760 hours. BNP: Invalid input(s): POCBNP CBG:  Recent Labs  09/17/14 1009  GLUCAP 121*    Radiological Exams: Dg Foot Complete Right  04/09/2015  CLINICAL DATA:  74 year old female with history of trauma from a fall complaining of right foot pain anteriorly. EXAM: RIGHT FOOT COMPLETE - 3+ VIEW COMPARISON:  Right ankle radiograph 08/21/2014. FINDINGS: Three views of the right foot demonstrate no acute displaced fracture, subluxation or dislocation. Posttraumatic deformity of the distal fibula with a lateral plate and screw fixation device in place, and some heterotopic ossification adjacent to the tibiotalar joint. Mild diffuse soft tissue swelling. IMPRESSION: 1. No acute radiographic abnormality of the right foot. Electronically Signed   By: Vinnie Langton M.D.   On: 04/09/2015 16:55    Assessment/Plan  Lumbar stenosis with radiculopathy S/p surgical decompression and coflex placement. Continue oxy-apap 5-325 mg q4h prn pain and baclofen 20 mg qhs for now. Has f/u with neurosurgery. Will have patient work with PT/OT as tolerated to regain strength and restore function.  Fall precautions are in place. Back precautions. Continue ca-vit d supplement  Neuropathic pain Continue neurontin 600 mg qid for now and monitor  Constipation  add colace 100 mg bid for now  parkisnon's disease Continue sinemet 25-100 mg bid for now  HTN Stable bp, monitor bp, continue hctz 25 mg daily  GAD Continue ativan 1 mg q8h prn and sertraline 100 mg daily  Migraine headache On prn imitrex for now, continue this  Insomnia On restoril 7.5 mg qhs prn  Hx of TIA Continue aspirin 325 mg daily  Goals of care: short term rehabilitation   Labs/tests ordered: cbc, cmp 04/27/15  Family/ staff Communication: reviewed care plan with patient and nursing supervisor    Blanchie Serve, MD  [facility] [phone] (Monday-Friday 8 am - 5 pm) [phone]  (afterhours)

## 2015-04-28 ENCOUNTER — Non-Acute Institutional Stay (SKILLED_NURSING_FACILITY): Payer: Medicare Other | Admitting: Internal Medicine

## 2015-04-28 DIAGNOSIS — F4321 Adjustment disorder with depressed mood: Secondary | ICD-10-CM

## 2015-04-28 DIAGNOSIS — M48061 Spinal stenosis, lumbar region without neurogenic claudication: Secondary | ICD-10-CM

## 2015-04-28 DIAGNOSIS — R296 Repeated falls: Secondary | ICD-10-CM | POA: Diagnosis not present

## 2015-04-28 DIAGNOSIS — M4806 Spinal stenosis, lumbar region: Secondary | ICD-10-CM | POA: Diagnosis not present

## 2015-04-28 NOTE — Progress Notes (Signed)
Patient ID: [name], female   DOB: 10/08/1940, 74 y.o.   MRN: [id number]     Facility: [facility]    PCP: Hollace Kinnier, DO  Code Status: full code  Allergies  Allergen Reactions  . Contrast Media [Iodinated Diagnostic Agents] Other (See Comments)    Respiratory failure  . Sulfa Drugs Cross Reactors Nausea And Vomiting    Chief Complaint  Patient presents with  . Acute Visit    falls, feeling low     HPI:  74 y.o. patient is seen with acute concern. She had 2 falls yesterday and two this am. 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 [name] seen in her room today. She denies any new pain to her back and other joints. [name] alert and oriented and mentions that she was waiting to be assisted and then got up by herself without assistance. At that time, she felt her legs to give away and fell down. she denies hitting her head or her bottom. Denies losing her consciousness. She mentions being restless when she cant get help at the very minute she needs it. She has PMH of HTN, TIA, GERD, polyneuropathy, obesity among others. Her pain is under control with current regimen.   Review of Systems:  Constitutional: Negative for fever, chills, diaphoresis.  HENT: Negative for headache, congestion.   Eyes: Negative for eye pain, blurred vision, double vision and discharge.  Respiratory: Negative for cough, shortness of breath and wheezing.   Cardiovascular: Negative for chest pain, palpitations, leg swelling.  Gastrointestinal: Negative for heartburn, nausea, vomiting, abdominal pain. Genitourinary: Negative for dysuria, flank pain.  Neurological: Negative for dizziness, has peripheral neuropathy Psychiatric/Behavioral: positive for depression, gets tearful during conversation and  mentions feeling helpless.    Past Medical History  Diagnosis Date  . GERD (gastroesophageal reflux disease)     hx pud '98  . Arthritis     djd  . Restless leg syndrome     takes Sinemet daily  . 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 Dysrhythmia     hx tachy palpitations  . Neuromuscular disorder ([place])     peripheral neuropathy, FEET AND LEGS  . Stroke [facility])     ? 6 months ago - tests okay - [facility]   . Cellulitis     10/11/11 hospitalized for cellulitis   . Hyperlipemia     takes Atorvastatin daily  . Peripheral neuropathy ([place])   . Elbow fracture, left April 2015  . Depression     takes Zoloft daily  . Vertigo     takes Meclizine daily as needed  . Insomnia     takes restoril nightly as needed  . Hypertension     takes HCTZ daily  . Peripheral neuropathy ([facility])     takes Gabapentin daily  . History of bronchitis     many yrs ago  . Headache(784.0)     migraines yrs ago-takes Imitrex daily  . Joint pain   . Joint swelling   . Chronic back pain     scoliosis/stenosis  . Anxiety     takes Ativan daily as needed  . Cataracts, bilateral     immature     Medications:   Medication List       This list  is accurate as of: 04/28/15 11:59 PM.  Always use your most recent med list.               acetaminophen 325 MG tablet  Commonly known as:  TYLENOL  Take 2 tablets (650 mg total) by mouth every 6 (six) hours as needed for mild pain (or Fever >/= 101).     AFRIN ALLERGY NA  Place 2 sprays into the nose daily as needed (Nasal congestion).     amoxicillin 500 MG capsule  Commonly known as:  AMOXIL  Take 2,000 mg by mouth See admin instructions. Must take 4 capsules 1 hour before dental procedures     aspirin 325 MG tablet  Take 325 mg by mouth daily.     atorvastatin 40 MG tablet  Commonly known as:  LIPITOR  Take one tablet by mouth once daily for cholesterol     baclofen 20 MG tablet  Commonly known as:  LIORESAL  Take 1 tablet (20 mg  total) by mouth at bedtime.     calcium carbonate 1250 MG capsule  Take 1,250 mg by mouth daily.     Carbidopa-Levodopa ER 25-100 MG tablet controlled release  Commonly known as:  SINEMET CR  Take 1 tablet by mouth 2 (two) times daily.     cholecalciferol 1000 UNITS tablet  Commonly known as:  VITAMIN D  Take 1,000 Units by mouth daily.     fluticasone 50 MCG/ACT nasal spray  Commonly known as:  FLONASE  SHAKE WELL AND USE 1 SPRAY IN EACH NOSTRIL DAILY AS NEEDED FOR ALLERGIES OR RHINITIS     gabapentin 600 MG tablet  Commonly known as:  NEURONTIN  Take 600 mg by mouth 4 (four) times daily - after meals and at bedtime.     hydrochlorothiazide 25 MG tablet  Commonly known as:  HYDRODIURIL  Take 1 tablet by mouth in  the morning     LORazepam 1 MG tablet  Commonly known as:  ATIVAN  Take 1 tablet (1 mg total) by mouth every 8 (eight) hours as needed for anxiety.     meclizine 25 MG tablet  Commonly known as:  ANTIVERT  Take one tablet by mouth once daily as needed for dizziness.     oxyCODONE-acetaminophen 5-325 MG tablet  Commonly known as:  PERCOCET/ROXICET  Take 1 tablet by mouth every 4 (four) hours as needed for severe pain.     potassium chloride SA 20 MEQ tablet  Commonly known as:  K-DUR,KLOR-CON  Take two tablet daily for potassium     pyridOXINE 50 MG tablet  Commonly known as:  VITAMIN B-6  Take 50 mg by mouth daily.     sertraline 100 MG tablet  Commonly known as:  ZOLOFT  Take 1 tablet (100 mg total) by mouth daily.     SUMAtriptan 50 MG tablet  Commonly known as:  IMITREX  See AUX Label     temazepam 7.5 MG capsule  Commonly known as:  RESTORIL  Take 1 capsule (7.5 mg total) by mouth at bedtime as needed for sleep.         Physical Exam: Filed Vitals:   04/28/15 1752  BP: 130/74  Pulse: 76  Temp: 97 F (36.1 C)  Resp: 18  SpO2: 95%    General- elderly female, obese, in no acute distress Head- normocephalic, atraumatic Nose- normal  nasal mucosa, no maxillary or frontal sinus tenderness, no nasal discharge Throat- moist mucus membrane Eyes- PERRLA, EOMI,  no pallor, no icterus, no discharge, normal conjunctiva, normal sclera Neck- no cervical lymphadenopathy Cardiovascular- normal s1,s2, no murmurs, palpable dorsalis pedis and radial pulses, trace leg edema Respiratory- bilateral clear to auscultation, no wheeze, no rhonchi, no crackles, no use of accessory muscles Abdomen- bowel sounds present, soft, non tender Musculoskeletal- able to move all 4 extremities, normal ROM with hip and knee joint, generalized weakness  Neurological- no focal deficit, alert and oriented to person, place and time, normal muscle tone on exam Skin- warm and dry, surgical incision on her back healing well with glue Psychiatry- tearful and somewhat anxious   Labs reviewed: Basic Metabolic Panel:  Recent Labs  09/19/14 0619  12/09/14 1249 04/19/15 1102 04/20/15 1325  NA 137  < > 136 137 137  K 2.7*  < > 3.4* 3.9 3.7  CL 92*  < > 88* 101 99*  CO2 33*  < > 25 27 29   GLUCOSE 150*  < > 109* 110* 159*  BUN 10  < > 27 11 15   CREATININE 0.77  < > 1.17* 1.07* 1.08*  CALCIUM 9.6  < > 9.5 9.3 9.2  MG 1.7  --   --   --   --   < > = values in this interval not displayed. Liver Function Tests:  Recent Labs  08/07/14 0614 04/20/15 1325  AST 22 28  ALT <5 <5*  ALKPHOS 80 107  BILITOT 1.0 0.4  PROT 8.2 6.2*  ALBUMIN 4.4 2.9*   No results for input(s): LIPASE, AMYLASE in the last 8760 hours. No results for input(s): AMMONIA in the last 8760 hours. CBC:  Recent Labs  08/07/14 0614 09/14/14 1234 09/17/14 0500 11/11/14 1135 04/19/15 1102  WBC 10.1 11.3* 10.7* 7.5 9.9  NEUTROABS 6.6  --   --  3.7  --   HGB 14.2 11.9* 10.4*  --  12.6  HCT 43.5 37.7 33.5* 39.8 40.4  MCV 94.6 95.0 97.4  --  98.3  PLT 356 348 266  --  287     Assessment/Plan  Frequent falls Has lower extremity weakness along with unsteady gait and neuropathy.  All of these increase her fall risk. Fall precautions. Refusing imaging to evaluate the knee or her back region. Refusing to be in the dining area where she could be monitored to prevent falls. Is alert and oriented and understands risk for fall with indepent transfers at present given her unsteady gait. Voices understanding this and calling for help for transfers from here on.   Depression Likely situational. Denies suicidal ideation. Get psyhciatry and psychology consult for now. Continue sertraline 100 mg daily for now with ativan 1 mg q8h prn.  Lumbar stenosis with radiculopathy S/p surgical decompression and coflex placement. Continue oxy-apap 5-325 mg q4h prn pain and baclofen 20 mg qhs for now. Has f/u with neurosurgery. Will have patient work with PT/OT as tolerated to regain strength and restore function.  Fall precautions are in place. Back precautions. Continue ca-vit d supplement   Family/ staff Communication: reviewed care plan with patient and nursing supervisor    Blanchie Serve, MD  [facility] [phone] (Monday-Friday 8 am - 5 pm) [phone] (afterhours)

## 2015-05-02 ENCOUNTER — Non-Acute Institutional Stay (SKILLED_NURSING_FACILITY): Payer: Medicare Other | Admitting: Nurse Practitioner

## 2015-05-02 DIAGNOSIS — I10 Essential (primary) hypertension: Secondary | ICD-10-CM

## 2015-05-02 DIAGNOSIS — M4806 Spinal stenosis, lumbar region: Secondary | ICD-10-CM

## 2015-05-02 DIAGNOSIS — M48061 Spinal stenosis, lumbar region without neurogenic claudication: Secondary | ICD-10-CM

## 2015-05-02 NOTE — Progress Notes (Signed)
Patient ID: [name], female   DOB: November 20, 1940, 74 y.o.   MRN: [id number]    Nursing Home Location:  [place] of Service: SNF (31)  PCP: [name], [name], DO  Allergies  Allergen Reactions  . Contrast Media [Iodinated Diagnostic Agents] Other (See Comments)    Respiratory failure  . Sulfa Drugs Cross Reactors Nausea And Vomiting    Chief Complaint  Patient presents with  . Acute Visit    elevated blood pressure    HPI:  Patient is a 74 y.o. female seen today at [facility] for acute visit due to elevated blood pressure.  [name] has PMH of HTN, TIA, GERD, polyneuropathy, obesity. Pt blood pressure was checked by CNA and was elevated therefore nursing rechecked and remained elevated. Pt denies headache, dizziness, blurred vision, chest pains, shortness of breath. Reports [name] feels fine and is not in any pain at this time. Has needed more blood pressure medication in the past but was taken off medication. No changes to diet.   Review of Systems:  Review of Systems  Constitutional: Negative for activity change, appetite change, fatigue and unexpected weight change.  HENT: Negative for congestion.   Eyes: Negative.   Respiratory: Negative for cough and shortness of breath.   Cardiovascular: Negative for chest pain, palpitations and leg swelling.  Gastrointestinal: Negative for abdominal pain, diarrhea and constipation.  Genitourinary: Negative for dysuria.  Musculoskeletal: Positive for myalgias, back pain and arthralgias.       Pain currently well controlled  Skin: Negative for color change.  Neurological: Negative for dizziness, syncope, speech difficulty, light-headedness and headaches.  Psychiatric/Behavioral: Negative for behavioral problems, confusion and agitation.    Past Medical History  Diagnosis Date  . GERD (gastroesophageal reflux disease)     hx pud '98  . Arthritis     djd  . Restless leg syndrome     takes  Sinemet daily  . 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 Dysrhythmia     hx tachy palpitations  . Neuromuscular disorder ([place])     peripheral neuropathy, FEET AND LEGS  . Stroke [facility])     ? 6 months ago - tests okay - [facility]   . Cellulitis     10/11/11 hospitalized for cellulitis   . Hyperlipemia     takes Atorvastatin daily  . Peripheral neuropathy ([place])   . Elbow fracture, left April 2015  . Depression     takes Zoloft daily  . Vertigo     takes Meclizine daily as needed  . Insomnia     takes restoril nightly as needed  . Hypertension     takes HCTZ daily  . Peripheral neuropathy ([facility])     takes Gabapentin daily  . History of bronchitis     many yrs ago  . Headache(784.0)     migraines yrs ago-takes Imitrex daily  . Joint pain   . Joint swelling   . Chronic back pain     scoliosis/stenosis  . Anxiety     takes Ativan daily as needed  . Cataracts, bilateral     immature    Past Surgical History  Procedure Laterality Date  . Cervical disc surgery x2  2011  . Rt carotid enarterectomy  2011  . Rt knee arthroscopy  '99  . Left knee arthroscopy  2001  . Hematoma evacuation  2011    s/p rt  cea  . Joint replacement  '01    total knee replacement, LEFT  . Abdominal hysterectomy  1975    bil oophorectomy  . Back surgery   MANY YRS AGO    laminectomy x3  . Cholecystectomy  1993  . Total knee arthroplasty  09/13/2011    Procedure: TOTAL KNEE ARTHROPLASTY;  Surgeon: Magnus Sinning, MD;  Location: [facility];  Service: Orthopedics;  Laterality: Right;  . Right knee replacement      09/2011   . Carpal tunnel release  07/09/2012    Procedure: CARPAL TUNNEL RELEASE;  Surgeon: Magnus Sinning, MD;  Location: [facility];  Service: Orthopedics;  Laterality: Left;  . Finger arthroplasty  07/09/2012    Procedure: FINGER ARTHROPLASTY;  Surgeon: Magnus Sinning, MD;  Location: [facility];  Service: Orthopedics;  Laterality: Left;  Interposition Arthroplasty [facility] Joint Thumb Left     . Lipoma excision  07/09/2012    Procedure: EXCISION LIPOMA;  Surgeon: Magnus Sinning, MD;  Location: [facility];  Service: Orthopedics;  Laterality: Left;  Excision Lipoma Dorsum Left Wrist   . Orif ankle fracture Right 09/14/2014    FIBULA   . Orif ankle fracture Right 09/14/2014    Procedure: OPEN REDUCTION INTERNAL FIXATION (ORIF) RIGHT ANKLE FRACTURE/SYNDESMOSIS ;  Surgeon: Wylene Simmer, MD;  Location: [place];  Service: Orthopedics;  Laterality: Right;  . Colonosocpy    . Esophagogastroduodenoscopy    . Lumbar laminectomy with coflex 1 level Bilateral 04/19/2015    Procedure: LUMBAR TWO-THREE LUMBAR LAMINECTOMY WITH COFLEX ;  Surgeon: Kristeen Miss, MD;  Location: [place] [facility];  Service: Neurosurgery;  Laterality: Bilateral;  Bilateral L23 laminectomy and foraminotomy with coflex   Social History:   reports that [name] has never smoked. [name] has never used smokeless tobacco. [name] reports that [name] does not drink alcohol or use illicit drugs.  Family History  Problem Relation Age of Onset  . Congestive Heart Failure Father   . Congestive Heart Failure Sister   . Alzheimer's disease Mother   . Diabetes Brother   . Arthritis Brother     Medications: Patient's Medications  New Prescriptions   No medications on file  Previous Medications   ACETAMINOPHEN (TYLENOL) 325 MG TABLET    Take 2 tablets (650 mg total) by mouth every 6 (six) hours as needed for mild pain (or Fever >/= 101).   AMOXICILLIN (AMOXIL) 500 MG CAPSULE    Take 2,000 mg by mouth See admin instructions. Must take 4 capsules 1 hour before dental procedures   ASPIRIN 325 MG TABLET    Take 325 mg by mouth daily.   ATORVASTATIN (LIPITOR) 40 MG TABLET    Take one tablet by mouth once daily for cholesterol   BACLOFEN (LIORESAL) 20 MG TABLET    Take 1 tablet (20 mg total) by mouth at bedtime.   CALCIUM CARBONATE 1250 MG CAPSULE    Take 1,250 mg by mouth daily.    CARBIDOPA-LEVODOPA ER (SINEMET CR) 25-100 MG TABLET CONTROLLED RELEASE     Take 1 tablet by mouth 2 (two) times daily.   CHOLECALCIFEROL (VITAMIN D) 1000 UNITS TABLET    Take 1,000 Units by mouth daily.   FLUTICASONE (FLONASE) 50 MCG/ACT NASAL SPRAY    SHAKE WELL AND USE 1 SPRAY IN EACH NOSTRIL DAILY AS NEEDED FOR ALLERGIES OR RHINITIS   GABAPENTIN (NEURONTIN) 600 MG TABLET    Take 600 mg by mouth 4 (four) times daily - after meals and at bedtime.  HYDROCHLOROTHIAZIDE (HYDRODIURIL) 25 MG TABLET    Take 1 tablet by mouth in  the morning   LORAZEPAM (ATIVAN) 1 MG TABLET    Take 1 tablet (1 mg total) by mouth every 8 (eight) hours as needed for anxiety.   MECLIZINE (ANTIVERT) 25 MG TABLET    Take one tablet by mouth once daily as needed for dizziness.   OXYCODONE-ACETAMINOPHEN (PERCOCET/ROXICET) 5-325 MG TABLET    Take 1 tablet by mouth every 4 (four) hours as needed for severe pain.   PHENYLEPHRINE HCL (AFRIN ALLERGY NA)    Place 2 sprays into the nose daily as needed (Nasal congestion).   POTASSIUM CHLORIDE SA (K-DUR,KLOR-CON) 20 MEQ TABLET    Take two tablet daily for potassium   PYRIDOXINE (VITAMIN B-6) 50 MG TABLET    Take 50 mg by mouth daily.   SERTRALINE (ZOLOFT) 100 MG TABLET    Take 1 tablet (100 mg total) by mouth daily.   SUMATRIPTAN (IMITREX) 50 MG TABLET    See AUX Label   TEMAZEPAM (RESTORIL) 7.5 MG CAPSULE    Take 1 capsule (7.5 mg total) by mouth at bedtime as needed for sleep.  Modified Medications   No medications on file  Discontinued Medications   No medications on file     Physical Exam: Filed Vitals:   05/02/15 1433  BP: 194/98  Pulse: 102  Temp: 97.7 F (36.5 C)  Resp: 20  SpO2: 96%    Physical Exam  Constitutional: [name] is oriented to person, place, and time. [name] appears well-developed and well-nourished. No distress.  HENT:  Head: Normocephalic and atraumatic.  Mouth/Throat: Oropharynx is clear and moist. No oropharyngeal exudate.  Eyes: Conjunctivae are normal. Pupils are equal, round, and reactive to light.  Neck: Normal range  of motion. Neck supple.  Cardiovascular: Normal rate, regular rhythm and normal heart sounds.   Pulmonary/Chest: Effort normal and breath sounds normal.  Musculoskeletal: [name] exhibits no edema.  Neurological: [name] is alert and oriented to person, place, and time. No cranial nerve deficit.  Skin: Skin is warm and dry. [name] is not diaphoretic.  Psychiatric: [name] has a normal mood and affect.    Labs reviewed: Basic Metabolic Panel:  Recent Labs  09/19/14 0619  12/09/14 1249 04/19/15 1102 04/20/15 1325  NA 137  < > 136 137 137  K 2.7*  < > 3.4* 3.9 3.7  CL 92*  < > 88* 101 99*  CO2 33*  < > 25 27 29   GLUCOSE 150*  < > 109* 110* 159*  BUN 10  < > 27 11 15   CREATININE 0.77  < > 1.17* 1.07* 1.08*  CALCIUM 9.6  < > 9.5 9.3 9.2  MG 1.7  --   --   --   --   < > = values in this interval not displayed. Liver Function Tests:  Recent Labs  08/07/14 0614 04/20/15 1325  AST 22 28  ALT <5 <5*  ALKPHOS 80 107  BILITOT 1.0 0.4  PROT 8.2 6.2*  ALBUMIN 4.4 2.9*   No results for input(s): LIPASE, AMYLASE in the last 8760 hours. No results for input(s): AMMONIA in the last 8760 hours. CBC:  Recent Labs  08/07/14 0614 09/14/14 1234 09/17/14 0500 11/11/14 1135 04/19/15 1102  WBC 10.1 11.3* 10.7* 7.5 9.9  NEUTROABS 6.6  --   --  3.7  --   HGB 14.2 11.9* 10.4*  --  12.6  HCT 43.5 37.7 33.5* 39.8 40.4  MCV 94.6  95.0 97.4  --  98.3  PLT 356 348 266  --  287   TSH:  Recent Labs  04/20/15 1325  TSH 0.890   A1C: Lab Results  Component Value Date   HGBA1C 6.6* 04/20/2015   Lipid Panel:  Recent Labs  11/11/14 1135  CHOL 222*  HDL 39*  LDLCALC 110*  TRIG 364*  CHOLHDL 5.7*    Radiological Exams: Dg Foot Complete Right  04/09/2015  CLINICAL DATA:  74 year old female with history of trauma from a fall complaining of right foot pain anteriorly. EXAM: RIGHT FOOT COMPLETE - 3+ VIEW COMPARISON:  Right ankle radiograph 08/21/2014. FINDINGS: Three views of the right foot  demonstrate no acute displaced fracture, subluxation or dislocation. Posttraumatic deformity of the distal fibula with a lateral plate and screw fixation device in place, and some heterotopic ossification adjacent to the tibiotalar joint. Mild diffuse soft tissue swelling. IMPRESSION: 1. No acute radiographic abnormality of the right foot. Electronically Signed   By: Vinnie Langton M.D.   On: 04/09/2015 16:55    Assessment/Plan 1. Lumbar stenosis Pain is well controlled on current regimen, conts with therapy  2. Hypertension Elevated blood pressure today on multiple checks. Asymptomatic, Will give clonidine 0.1 mg now and start lisinopril 10 mg daily  -to cont HTCZ Will have staff check VS q 4 hours x 24 hrs then q 8 -bmp in 2 weeks    Janaisa Birkland K. Harle Battiest  [facility] [phone] 8 am - 5 pm) [phone] (after hours)

## 2015-05-03 ENCOUNTER — Non-Acute Institutional Stay (SKILLED_NURSING_FACILITY): Payer: Medicare Other | Admitting: Internal Medicine

## 2015-05-03 DIAGNOSIS — R110 Nausea: Secondary | ICD-10-CM | POA: Diagnosis not present

## 2015-05-03 DIAGNOSIS — I10 Essential (primary) hypertension: Secondary | ICD-10-CM | POA: Diagnosis not present

## 2015-05-03 NOTE — Progress Notes (Signed)
Patient ID: [name], female   DOB: [birth year], 74 y.o.   MRN: [id number]     Facility: [facility]    PCP: Hollace Kinnier, DO  Code Status: full code  Allergies  Allergen Reactions  . Contrast Media [Iodinated Diagnostic Agents] Other (See Comments)    Respiratory failure  . Sulfa Drugs Cross Reactors Nausea And Vomiting    Chief Complaint  Patient presents with  . Acute Visit    elevated BP, nausea     HPI:  74 y.o. patient is seen with acute concern. Her BP reading has been elevated. She was started on lisinopril 10 mg daily yesterday. This am BP has been elevated and she has been nauseous. Denies any vomiting, chest pain, dyspnea or acute abdominal pain. Denies any headache, trouble with bowel movement or urination. 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 Pain is under control at present  Review of Systems:  Constitutional: Negative for fever, chills, diaphoresis.  HENT: Negative for headache, congestion.   Eyes: Negative for eye pain, blurred vision, double vision and discharge.  Respiratory: Negative for cough, shortness of breath and wheezing.   Cardiovascular: Negative for chest pain, palpitations, leg swelling.  Gastrointestinal: Negative for heartburn Genitourinary: Negative for dysuria, flank pain.  Neurological: Negative for dizziness, has peripheral neuropathy    Past Medical History  Diagnosis Date  . GERD (gastroesophageal reflux disease)     hx pud '98  . Arthritis     djd  . Restless leg syndrome     takes Sinemet daily  . 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 Dysrhythmia     hx tachy palpitations  . Neuromuscular disorder ([place])     peripheral neuropathy, FEET AND LEGS  . Stroke [facility])     ? 6 months ago - tests okay -  [facility]   . Cellulitis     10/11/11 hospitalized for cellulitis   . Hyperlipemia     takes Atorvastatin daily  . Peripheral neuropathy ([place])   . Elbow fracture, left April 2015  . Depression     takes Zoloft daily  . Vertigo     takes Meclizine daily as needed  . Insomnia     takes restoril nightly as needed  . Hypertension     takes HCTZ daily  . Peripheral neuropathy ([facility])     takes Gabapentin daily  . History of bronchitis     many yrs ago  . Headache(784.0)     migraines yrs ago-takes Imitrex daily  . Joint pain   . Joint swelling   . Chronic back pain     scoliosis/stenosis  . Anxiety     takes Ativan daily as needed  . Cataracts, bilateral     immature     Medications: Medication reviewed. See MAR   Physical Exam: Filed Vitals:   05/03/15 1508  BP: 191/105  Pulse: 94  Temp: 97.8 F (36.6 C)  Resp: 18    General- elderly female, obese, in no acute distress Head- normocephalic, atraumatic Nose- normal nasal mucosa, no maxillary or frontal sinus tenderness, no nasal discharge Throat- moist mucus membrane Eyes- PERRLA, EOMI, no pallor, no icterus, no discharge, normal conjunctiva, normal sclera Neck- no cervical lymphadenopathy Cardiovascular- normal s1,s2, no murmurs, palpable dorsalis pedis and radial  pulses, trace leg edema Respiratory- bilateral clear to auscultation, no wheeze, no rhonchi, no crackles, no use of accessory muscles Abdomen- bowel sounds present, soft, non tender Musculoskeletal- able to move all 4 extremities, normal ROM with hip and knee joint, generalized weakness  Neurological- no focal deficit, alert and oriented to person, place and time, normal muscle tone on exam Skin- warm and dry, surgical incision on her back healing well with glue Psychiatry- calm this visit   Labs reviewed: Basic Metabolic Panel:  Recent Labs  09/19/14 0619  12/09/14 1249 04/19/15 1102 04/20/15 1325  NA 137  < > 136 137 137  K 2.7*  < > 3.4* 3.9 3.7    CL 92*  < > 88* 101 99*  CO2 33*  < > 25 27 29   GLUCOSE 150*  < > 109* 110* 159*  BUN 10  < > 27 11 15   CREATININE 0.77  < > 1.17* 1.07* 1.08*  CALCIUM 9.6  < > 9.5 9.3 9.2  MG 1.7  --   --   --   --   < > = values in this interval not displayed. Liver Function Tests:  Recent Labs  08/07/14 0614 04/20/15 1325  AST 22 28  ALT <5 <5*  ALKPHOS 80 107  BILITOT 1.0 0.4  PROT 8.2 6.2*  ALBUMIN 4.4 2.9*   No results for input(s): LIPASE, AMYLASE in the last 8760 hours. No results for input(s): AMMONIA in the last 8760 hours. CBC:  Recent Labs  08/07/14 0614 09/14/14 1234 09/17/14 0500 11/11/14 1135 04/19/15 1102  WBC 10.1 11.3* 10.7* 7.5 9.9  NEUTROABS 6.6  --   --  3.7  --   HGB 14.2 11.9* 10.4*  --  12.6  HCT 43.5 37.7 33.5* 39.8 40.4  MCV 94.6 95.0 97.4  --  98.3  PLT 356 348 266  --  287     Assessment/Plan  Uncontrolled hypertension Clonidine 0.1 mg x 1 and phenergan 25 mg po x 1 provided. Patient made to lie down. Repeat bp check in 1 hour was 168/90 with her nausea resolved. On further recheck at noon, BP is 136/81. [name] on hctz 25 mg daily with lisinopril 10 mg daily at present. Since lisinopril was only started yesterday, will have her on VS check q shift and clonidine 0.1 mg bid prn for SBP>160 for now and reassess  Nausea Was likely in setting of elevated BP. Resolved at present with phenergan, monitor   Family/ staff Communication: reviewed care plan with patient and nursing supervisor    Blanchie Serve, MD  [place] [phone] (Monday-Friday 8 am - 5 pm) [phone] (afterhours)

## 2015-05-05 ENCOUNTER — Other Ambulatory Visit: Payer: Self-pay | Admitting: *Deleted

## 2015-05-05 MED ORDER — LORAZEPAM 1 MG PO TABS: 1.0000 mg | ORAL_TABLET | Freq: Three times a day (TID) | ORAL | Status: DC | PRN

## 2015-05-05 NOTE — Telephone Encounter (Signed)
[facility] 

## 2015-05-09 ENCOUNTER — Non-Acute Institutional Stay (SKILLED_NURSING_FACILITY): Payer: Medicare Other | Admitting: Nurse Practitioner

## 2015-05-09 DIAGNOSIS — G63 Polyneuropathy in diseases classified elsewhere: Secondary | ICD-10-CM

## 2015-05-09 DIAGNOSIS — M48061 Spinal stenosis, lumbar region without neurogenic claudication: Secondary | ICD-10-CM

## 2015-05-09 DIAGNOSIS — I10 Essential (primary) hypertension: Secondary | ICD-10-CM

## 2015-05-09 DIAGNOSIS — F411 Generalized anxiety disorder: Secondary | ICD-10-CM | POA: Diagnosis not present

## 2015-05-09 DIAGNOSIS — E785 Hyperlipidemia, unspecified: Secondary | ICD-10-CM | POA: Diagnosis not present

## 2015-05-09 DIAGNOSIS — M4806 Spinal stenosis, lumbar region: Secondary | ICD-10-CM | POA: Diagnosis not present

## 2015-05-09 NOTE — Progress Notes (Signed)
Patient ID: [name], female   DOB: 05-15-1941, 74 y.o.   MRN: [id number]    Nursing Home Location:  [place] of Service: SNF (31)  PCP: [name], [name], DO  Allergies  Allergen Reactions  . Contrast Media [Iodinated Diagnostic Agents] Other (See Comments)    Respiratory failure  . Sulfa Drugs Cross Reactors Nausea And Vomiting    Chief Complaint  Patient presents with  . Discharge Note    HPI:  Patient is a 74 y.o. female seen today at [facility] for discharge home.  She has PMH of HTN, TIA, GERD, polyneuropathy, obesity. 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 She underwent lumbar decompression with laminotomies and had placement of a coflex.among others. Pt reports her pain is under control at this time. No constipation. Since pt has been at [facility] place blood pressure has been elevated and lisinopril added to HCTZ. Also using clonidine 0.1 mg BID PRN. Patient currently doing well with therapy, now stable to discharge home with home health.    Review of Systems:  Review of Systems  Constitutional: Negative for activity change, appetite change, fatigue and unexpected weight change.  HENT: Negative for congestion.   Eyes: Negative.   Respiratory: Negative for cough and shortness of breath.   Cardiovascular: Negative for chest pain, palpitations and leg swelling.  Gastrointestinal: Negative for abdominal pain, diarrhea and constipation.  Genitourinary: Negative for dysuria and difficulty urinating.  Musculoskeletal: Positive for myalgias, back pain and arthralgias.       Pain controlled on current regimen   Skin: Negative for color change.  Neurological: Negative for dizziness.  Psychiatric/Behavioral: Negative for behavioral problems, confusion and agitation.    Past Medical History    Diagnosis Date  . GERD (gastroesophageal reflux disease)     hx pud '98  . Arthritis     djd  . Restless leg syndrome     takes Sinemet daily  . 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 Dysrhythmia     hx tachy palpitations  . Neuromuscular disorder ([place])     peripheral neuropathy, FEET AND LEGS  . Stroke [facility])     ? 6 months ago - tests okay - [facility]   . Cellulitis     10/11/11 hospitalized for cellulitis   . Hyperlipemia     takes Atorvastatin daily  . Peripheral neuropathy ([place])   . Elbow fracture, left April 2015  . Depression     takes Zoloft daily  . Vertigo     takes Meclizine daily as needed  . Insomnia     takes restoril nightly as needed  . Hypertension     takes HCTZ daily  . Peripheral neuropathy ([facility])     takes Gabapentin daily  . History of bronchitis     many yrs ago  . Headache(784.0)     migraines yrs ago-takes Imitrex daily  . Joint pain   . Joint swelling   . Chronic back pain     scoliosis/stenosis  . Anxiety     takes Ativan daily as needed  . Cataracts, bilateral     immature    Past Surgical History  Procedure Laterality Date  . Cervical disc surgery x2  2011  . Rt carotid enarterectomy  2011  . Rt knee arthroscopy  '99  .  Left knee arthroscopy  2001  . Hematoma evacuation  2011    s/p rt cea  . Joint replacement  '01    total knee replacement, LEFT  . Abdominal hysterectomy  1975    bil oophorectomy  . Back surgery   MANY YRS AGO    laminectomy x3  . Cholecystectomy  1993  . Total knee arthroplasty  09/13/2011    Procedure: TOTAL KNEE ARTHROPLASTY;  Surgeon: Magnus Sinning, MD;  Location: [facility];  Service: Orthopedics;  Laterality: Right;  . Right knee replacement      09/2011   . Carpal tunnel release  07/09/2012    Procedure: CARPAL TUNNEL RELEASE;  Surgeon: Magnus Sinning, MD;  Location: [facility];  Service: Orthopedics;  Laterality: Left;  . Finger arthroplasty  07/09/2012    Procedure: FINGER ARTHROPLASTY;  Surgeon:  Magnus Sinning, MD;  Location: [facility];  Service: Orthopedics;  Laterality: Left;  Interposition Arthroplasty [facility] Joint Thumb Left   . Lipoma excision  07/09/2012    Procedure: EXCISION LIPOMA;  Surgeon: Magnus Sinning, MD;  Location: [facility];  Service: Orthopedics;  Laterality: Left;  Excision Lipoma Dorsum Left Wrist   . Orif ankle fracture Right 09/14/2014    FIBULA   . Orif ankle fracture Right 09/14/2014    Procedure: OPEN REDUCTION INTERNAL FIXATION (ORIF) RIGHT ANKLE FRACTURE/SYNDESMOSIS ;  Surgeon: Wylene Simmer, MD;  Location: [place];  Service: Orthopedics;  Laterality: Right;  . Colonosocpy    . Esophagogastroduodenoscopy    . Lumbar laminectomy with coflex 1 level Bilateral 04/19/2015    Procedure: LUMBAR TWO-THREE LUMBAR LAMINECTOMY WITH COFLEX ;  Surgeon: Kristeen Miss, MD;  Location: [place] [facility];  Service: Neurosurgery;  Laterality: Bilateral;  Bilateral L23 laminectomy and foraminotomy with coflex   Social History:   reports that she has never smoked. She has never used smokeless tobacco. She reports that she does not drink alcohol or use illicit drugs.  Family History  Problem Relation Age of Onset  . Congestive Heart Failure Father   . Congestive Heart Failure Sister   . Alzheimer's disease Mother   . Diabetes Brother   . Arthritis Brother     Medications: Patient's Medications  New Prescriptions   No medications on file  Previous Medications   ACETAMINOPHEN (TYLENOL) 325 MG TABLET    Take 2 tablets (650 mg total) by mouth every 6 (six) hours as needed for mild pain (or Fever >/= 101).   AMOXICILLIN (AMOXIL) 500 MG CAPSULE    Take 2,000 mg by mouth See admin instructions. Must take 4 capsules 1 hour before dental procedures   ASPIRIN 325 MG TABLET    Take 325 mg by mouth daily.   ATORVASTATIN (LIPITOR) 40 MG TABLET    Take one tablet by mouth once daily for cholesterol   BACLOFEN (LIORESAL) 20 MG TABLET    Take 1 tablet (20 mg total) by mouth at bedtime.   CALCIUM  CARBONATE 1250 MG CAPSULE    Take 1,250 mg by mouth daily.    CARBIDOPA-LEVODOPA ER (SINEMET CR) 25-100 MG TABLET CONTROLLED RELEASE    Take 1 tablet by mouth 2 (two) times daily.   CHOLECALCIFEROL (VITAMIN D) 1000 UNITS TABLET    Take 1,000 Units by mouth daily.   CLONIDINE (CATAPRES) 0.1 MG TABLET    Take 0.1 mg by mouth 2 (two) times daily as needed (sbp over 160).   FLUTICASONE (FLONASE) 50 MCG/ACT NASAL SPRAY    SHAKE WELL  AND USE 1 SPRAY IN EACH NOSTRIL DAILY AS NEEDED FOR ALLERGIES OR RHINITIS   GABAPENTIN (NEURONTIN) 600 MG TABLET    Take 600 mg by mouth 4 (four) times daily - after meals and at bedtime.   HYDROCHLOROTHIAZIDE (HYDRODIURIL) 25 MG TABLET    Take 1 tablet by mouth in  the morning   LISINOPRIL (PRINIVIL,ZESTRIL) 10 MG TABLET    Take 10 mg by mouth daily.   LORAZEPAM (ATIVAN) 1 MG TABLET    Take 1 tablet (1 mg total) by mouth every 8 (eight) hours as needed for anxiety.   MECLIZINE (ANTIVERT) 25 MG TABLET    Take one tablet by mouth once daily as needed for dizziness.   OXYCODONE-ACETAMINOPHEN (PERCOCET/ROXICET) 5-325 MG TABLET    Take 1 tablet by mouth every 4 (four) hours as needed for severe pain.   PHENYLEPHRINE HCL (AFRIN ALLERGY NA)    Place 2 sprays into the nose daily as needed (Nasal congestion).   POTASSIUM CHLORIDE SA (K-DUR,KLOR-CON) 20 MEQ TABLET    Take two tablet daily for potassium   PYRIDOXINE (VITAMIN B-6) 50 MG TABLET    Take 50 mg by mouth daily.   SERTRALINE (ZOLOFT) 100 MG TABLET    Take 1 tablet (100 mg total) by mouth daily.   SUMATRIPTAN (IMITREX) 50 MG TABLET    See AUX Label   TEMAZEPAM (RESTORIL) 7.5 MG CAPSULE    Take 1 capsule (7.5 mg total) by mouth at bedtime as needed for sleep.  Modified Medications   No medications on file  Discontinued Medications   No medications on file     Physical Exam: Filed Vitals:   05/09/15 1253  BP: 150/60  Pulse: 87  Temp: 98.5 F (36.9 C)  Resp: 20    Physical Exam  Constitutional: [name] oriented  to person, place, and time. She appears well-developed and well-nourished. No distress.  HENT:  Head: Normocephalic and atraumatic.  Mouth/Throat: Oropharynx is clear and moist. No oropharyngeal exudate.  Neck: Normal range of motion. Neck supple.  Cardiovascular: Normal rate, regular rhythm and normal heart sounds.   Pulmonary/Chest: Effort normal and breath sounds normal.  Abdominal: Soft. Bowel sounds are normal.  Musculoskeletal: She exhibits no edema or tenderness.  Neurological: [name] alert and oriented to person, place, and time.  Skin: Skin is warm and dry. [name] not diaphoretic.  Well healing surgery incision to lumbar spine  Psychiatric: She has a normal mood and affect.    Labs reviewed: Basic Metabolic Panel:  Recent Labs  09/19/14 0619  12/09/14 1249 04/19/15 1102 04/20/15 1325  NA 137  < > 136 137 137  K 2.7*  < > 3.4* 3.9 3.7  CL 92*  < > 88* 101 99*  CO2 33*  < > 25 27 29   GLUCOSE 150*  < > 109* 110* 159*  BUN 10  < > 27 11 15   CREATININE 0.77  < > 1.17* 1.07* 1.08*  CALCIUM 9.6  < > 9.5 9.3 9.2  MG 1.7  --   --   --   --   < > = values in this interval not displayed. Liver Function Tests:  Recent Labs  08/07/14 0614 04/20/15 1325  AST 22 28  ALT <5 <5*  ALKPHOS 80 107  BILITOT 1.0 0.4  PROT 8.2 6.2*  ALBUMIN 4.4 2.9*   No results for input(s): LIPASE, AMYLASE in the last 8760 hours. No results for input(s): AMMONIA in the last 8760 hours.  CBC:  Recent Labs  08/07/14 0614 09/14/14 1234 09/17/14 0500 11/11/14 1135 04/19/15 1102  WBC 10.1 11.3* 10.7* 7.5 9.9  NEUTROABS 6.6  --   --  3.7  --   HGB 14.2 11.9* 10.4*  --  12.6  HCT 43.5 37.7 33.5* 39.8 40.4  MCV 94.6 95.0 97.4  --  98.3  PLT 356 348 266  --  287   TSH:  Recent Labs  04/20/15 1325  TSH 0.890   A1C: Lab Results  Component Value Date   HGBA1C 6.6* 04/20/2015   Lipid Panel:  Recent Labs  11/11/14 1135  CHOL 222*  HDL 39*  LDLCALC 110*  TRIG 364*  CHOLHDL  5.7*    Radiological Exams: Dg Foot Complete Right  04/09/2015  CLINICAL DATA:  74 year old female with history of trauma from a fall complaining of right foot pain anteriorly. EXAM: RIGHT FOOT COMPLETE - 3+ VIEW COMPARISON:  Right ankle radiograph 08/21/2014. FINDINGS: Three views of the right foot demonstrate no acute displaced fracture, subluxation or dislocation. Posttraumatic deformity of the distal fibula with a lateral plate and screw fixation device in place, and some heterotopic ossification adjacent to the tibiotalar joint. Mild diffuse soft tissue swelling. IMPRESSION: 1. No acute radiographic abnormality of the right foot. Electronically Signed   By: Vinnie Langton M.D.   On: 04/09/2015 16:55    Assessment/Plan 1. Lumbar stenosis S/p surgical decompression and coflex placement. pain controlled with oxy-apap 5-325 mg q4h prn pain and baclofen 20 mg qhs.  Ongoing follow up with neurosurgery.   2. Essential hypertension Improved, will need ongoing follow up.  To cont lisinipril and hctz at this time with PRN clonidine.   3. GAD (generalized anxiety disorder) Stable on zoloft and ativan PRN  4. Polyneuropathy in other diseases classified elsewhere ([place]) Stable, conts on gabapenin 600 mg QID  5. Hyperlipidemia Cont on lipitor.   pt is stable for discharge-will need PT/OT per home health. No DME needed. Rx written.  will need to follow up with PCP within 2 weeks.      Carlos American. Harle Battiest  [facility] [phone] 8 am - 5 pm) [phone] (after hours)

## 2015-05-16 ENCOUNTER — Telehealth: Payer: Self-pay | Admitting: Neurology

## 2015-05-16 MED ORDER — HYDROCODONE-ACETAMINOPHEN 5-325 MG PO TABS: 1.0000 | ORAL_TABLET | Freq: Four times a day (QID) | ORAL | Status: DC | PRN

## 2015-05-16 NOTE — Telephone Encounter (Signed)
Patient called to check pick up status of Rx for Hydrocodone

## 2015-05-16 NOTE — Telephone Encounter (Signed)
Pt called asking for a refill on Hydrocodone. It is not listed on her medication list. Thank you

## 2015-05-16 NOTE — Telephone Encounter (Signed)
Please see other phone note from today's date (05/16/15).

## 2015-05-16 NOTE — Telephone Encounter (Signed)
I called the patient. She has had recent surgery by Dr. Ellene Route, she was not given any opiate medications, she may have received a prescription for Ultram. [name] not on oxycodone. I will write her usual prescription for hydrocodone, she continues to have back pain, knee pain, and left foot pain. She has fallen on 2 occasions at the rehabilitation center.

## 2015-05-16 NOTE — Telephone Encounter (Signed)
Pt called and wanted to let the physician know that she was going to stop taking Carbidopa-Levodopa ER (SINEMET CR) 25-100 MG tablet controlled release. May call pt at [phone]

## 2015-05-16 NOTE — Telephone Encounter (Signed)
Rx is ready for pick up. I called to tell the patient. She stated her neighbor would be coming to get it. I advised that he is not on her DPR form and per policy, only the patient and anyone on their DPR form can pick up Rx for narcotics. She requested to speak to Dr. Jannifer Franklin about this. She also stated that a neurologist in the hospital started tapering her off of Sinemet due to her not "meeting the requirements for restless leg syndrome." She just wanted to let Dr. Jannifer Franklin know.

## 2015-05-17 NOTE — Telephone Encounter (Signed)
I called the patient. I verified that the neighbor did pick the Rx up last time. I advised that, per Jinny Blossom, we will allow it to happen this time, but we will send DPR form with neighbor for patient to sign. I advised she put anyone's name on it who may pick up Rx in future. She verbalized understanding.

## 2015-05-17 NOTE — Telephone Encounter (Signed)
Patient called, states she was talking with someone earlier who is supposed to be checking on whether or not her neighbor can pick up her Rx, says she was told neighbor is not on DPR and can't pick up Rx, states that neighbor has picked it up before, she just had surgery, got out of rehab, she can't pick it up, states she is a [employer], she knows policies and procedures, states she has never seen that form before, she can't believe that she would be allowed to suffer over this, patient became tearful, wants to speak to Dr. Jannifer Franklin, I have advised patient someone is working on this and would call her back in 30 minutes or less.

## 2015-05-22 ENCOUNTER — Other Ambulatory Visit: Payer: Self-pay | Admitting: Nurse Practitioner

## 2015-05-23 ENCOUNTER — Ambulatory Visit: Payer: Self-pay | Admitting: Internal Medicine

## 2015-06-04 ENCOUNTER — Other Ambulatory Visit: Payer: Self-pay | Admitting: Nurse Practitioner

## 2015-06-10 DIAGNOSIS — S82451D Displaced comminuted fracture of shaft of right fibula, subsequent encounter for closed fracture with routine healing: Secondary | ICD-10-CM | POA: Diagnosis not present

## 2015-06-12 ENCOUNTER — Other Ambulatory Visit: Payer: Self-pay | Admitting: Internal Medicine

## 2015-06-12 ENCOUNTER — Other Ambulatory Visit: Payer: Self-pay | Admitting: Adult Health

## 2015-06-13 ENCOUNTER — Other Ambulatory Visit: Payer: Self-pay | Admitting: *Deleted

## 2015-06-13 MED ORDER — LORAZEPAM 1 MG PO TABS: 1.0000 mg | ORAL_TABLET | Freq: Three times a day (TID) | ORAL | Status: DC | PRN

## 2015-06-13 NOTE — Telephone Encounter (Signed)
[facility] 

## 2015-06-15 ENCOUNTER — Encounter (HOSPITAL_COMMUNITY): Payer: Self-pay | Admitting: Emergency Medicine

## 2015-06-15 ENCOUNTER — Emergency Department (HOSPITAL_COMMUNITY)
Admission: EM | Admit: 2015-06-15 | Discharge: 2015-06-16 | Disposition: A | Discharge status: Home / Self Care | Payer: Medicare Other | Attending: Emergency Medicine | Admitting: Emergency Medicine

## 2015-06-15 DIAGNOSIS — G43909 Migraine, unspecified, not intractable, without status migrainosus: Secondary | ICD-10-CM | POA: Insufficient documentation

## 2015-06-15 DIAGNOSIS — F419 Anxiety disorder, unspecified: Secondary | ICD-10-CM | POA: Diagnosis not present

## 2015-06-15 DIAGNOSIS — G629 Polyneuropathy, unspecified: Secondary | ICD-10-CM | POA: Insufficient documentation

## 2015-06-15 DIAGNOSIS — G4700 Insomnia, unspecified: Secondary | ICD-10-CM | POA: Insufficient documentation

## 2015-06-15 DIAGNOSIS — Z7982 Long term (current) use of aspirin: Secondary | ICD-10-CM | POA: Diagnosis not present

## 2015-06-15 DIAGNOSIS — K219 Gastro-esophageal reflux disease without esophagitis: Secondary | ICD-10-CM | POA: Insufficient documentation

## 2015-06-15 DIAGNOSIS — F332 Major depressive disorder, recurrent severe without psychotic features: Secondary | ICD-10-CM | POA: Diagnosis not present

## 2015-06-15 DIAGNOSIS — Z79899 Other long term (current) drug therapy: Secondary | ICD-10-CM | POA: Insufficient documentation

## 2015-06-15 DIAGNOSIS — F1310 Sedative, hypnotic or anxiolytic abuse, uncomplicated: Secondary | ICD-10-CM | POA: Insufficient documentation

## 2015-06-15 DIAGNOSIS — E876 Hypokalemia: Secondary | ICD-10-CM | POA: Insufficient documentation

## 2015-06-15 DIAGNOSIS — G8929 Other chronic pain: Secondary | ICD-10-CM | POA: Diagnosis not present

## 2015-06-15 DIAGNOSIS — Z8781 Personal history of (healed) traumatic fracture: Secondary | ICD-10-CM | POA: Diagnosis not present

## 2015-06-15 DIAGNOSIS — M1990 Unspecified osteoarthritis, unspecified site: Secondary | ICD-10-CM | POA: Insufficient documentation

## 2015-06-15 DIAGNOSIS — G2581 Restless legs syndrome: Secondary | ICD-10-CM | POA: Insufficient documentation

## 2015-06-15 DIAGNOSIS — Z872 Personal history of diseases of the skin and subcutaneous tissue: Secondary | ICD-10-CM | POA: Diagnosis not present

## 2015-06-15 DIAGNOSIS — E785 Hyperlipidemia, unspecified: Secondary | ICD-10-CM | POA: Insufficient documentation

## 2015-06-15 DIAGNOSIS — F329 Major depressive disorder, single episode, unspecified: Secondary | ICD-10-CM | POA: Insufficient documentation

## 2015-06-15 DIAGNOSIS — F32A Depression, unspecified: Secondary | ICD-10-CM

## 2015-06-15 LAB — CBC WITH DIFFERENTIAL/PLATELET
Basophils Absolute: 0 10*3/uL (ref 0.0–0.1)
Basophils Relative: 0 %
Eosinophils Absolute: 0.2 10*3/uL (ref 0.0–0.7)
Eosinophils Relative: 1 %
HCT: 48.3 % — ABNORMAL HIGH (ref 36.0–46.0)
Hemoglobin: 15 g/dL (ref 12.0–15.0)
Lymphocytes Relative: 23 %
Lymphs Abs: 3.1 10*3/uL (ref 0.7–4.0)
MCH: 30.2 pg (ref 26.0–34.0)
MCHC: 31.1 g/dL (ref 30.0–36.0)
MCV: 97.4 fL (ref 78.0–100.0)
Monocytes Absolute: 0.9 10*3/uL (ref 0.1–1.0)
Monocytes Relative: 7 %
Neutro Abs: 9.1 10*3/uL — ABNORMAL HIGH (ref 1.7–7.7)
Neutrophils Relative %: 69 %
Platelets: 414 10*3/uL — ABNORMAL HIGH (ref 150–400)
RBC: 4.96 MIL/uL (ref 3.87–5.11)
RDW: 13.8 % (ref 11.5–15.5)
WBC: 13.2 10*3/uL — ABNORMAL HIGH (ref 4.0–10.5)

## 2015-06-15 LAB — ACETAMINOPHEN LEVEL: Acetaminophen (Tylenol), Serum: <10 ug/mL — ABNORMAL LOW (ref 10–30)

## 2015-06-15 LAB — ETHANOL: Alcohol, Ethyl (B): <5 mg/dL (ref <5)

## 2015-06-15 LAB — COMPREHENSIVE METABOLIC PANEL
ALT: <5 U/L — ABNORMAL LOW (ref 14–54)
AST: 33 U/L (ref 15–41)
Albumin: 4.8 g/dL (ref 3.5–5.0)
Alkaline Phosphatase: 107 U/L (ref 38–126)
Anion gap: 19 — ABNORMAL HIGH (ref 5–15)
BUN: 15 mg/dL (ref 6–20)
CO2: 26 mmol/L (ref 22–32)
Calcium: 10.3 mg/dL (ref 8.9–10.3)
Chloride: 92 mmol/L — ABNORMAL LOW (ref 101–111)
Creatinine, Ser: 1.47 mg/dL — ABNORMAL HIGH (ref 0.44–1.00)
GFR calc Af Amer: 40 mL/min — ABNORMAL LOW (ref >60)
GFR calc non Af Amer: 34 mL/min — ABNORMAL LOW (ref >60)
Glucose, Bld: 166 mg/dL — ABNORMAL HIGH (ref 65–99)
Potassium: 3.2 mmol/L — ABNORMAL LOW (ref 3.5–5.1)
Sodium: 137 mmol/L (ref 135–145)
Total Bilirubin: 0.8 mg/dL (ref 0.3–1.2)
Total Protein: 9 g/dL — ABNORMAL HIGH (ref 6.5–8.1)

## 2015-06-15 LAB — SALICYLATE LEVEL: Salicylate Lvl: <4 mg/dL (ref 2.8–30.0)

## 2015-06-15 MED ORDER — POTASSIUM CHLORIDE CRYS ER 20 MEQ PO TBCR
40.0000 meq | EXTENDED_RELEASE_TABLET | Freq: Once | ORAL | Status: AC
  Administered 2015-06-15: 40 meq via ORAL
  Filled 2015-06-15: qty 2

## 2015-06-15 MED ORDER — TEMAZEPAM 7.5 MG PO CAPS
7.5000 mg | ORAL_CAPSULE | Freq: Every evening | ORAL | Status: DC | PRN
  Administered 2015-06-15: 7.5 mg via ORAL
  Filled 2015-06-15: qty 1

## 2015-06-15 MED ORDER — HYDROCHLOROTHIAZIDE 25 MG PO TABS
25.0000 mg | ORAL_TABLET | Freq: Every day | ORAL | Status: DC
  Administered 2015-06-16: 25 mg via ORAL
  Filled 2015-06-15: qty 1

## 2015-06-15 MED ORDER — ATORVASTATIN CALCIUM 40 MG PO TABS
40.0000 mg | ORAL_TABLET | Freq: Every day | ORAL | Status: DC
  Administered 2015-06-15 – 2015-06-16 (×2): 40 mg via ORAL
  Filled 2015-06-15 (×3): qty 1

## 2015-06-15 MED ORDER — CALCIUM CARBONATE 1250 (500 CA) MG PO TABS
1.0000 | ORAL_TABLET | Freq: Every day | ORAL | Status: DC
  Administered 2015-06-16: 500 mg via ORAL
  Filled 2015-06-15 (×2): qty 1

## 2015-06-15 MED ORDER — GABAPENTIN 300 MG PO CAPS
600.0000 mg | ORAL_CAPSULE | Freq: Three times a day (TID) | ORAL | Status: DC
Start: 2015-06-15 — End: 2015-06-16
  Administered 2015-06-15 – 2015-06-16 (×2): 600 mg via ORAL
  Filled 2015-06-15 (×2): qty 2

## 2015-06-15 MED ORDER — ACETAMINOPHEN 325 MG PO TABS
650.0000 mg | ORAL_TABLET | Freq: Four times a day (QID) | ORAL | Status: DC | PRN
  Filled 2015-06-15: qty 2

## 2015-06-15 MED ORDER — CALCIUM CARBONATE 1250 (500 CA) MG PO CAPS: 1250.0000 mg | ORAL_CAPSULE | Freq: Every day | ORAL | Status: DC

## 2015-06-15 MED ORDER — LISINOPRIL 20 MG PO TABS
20.0000 mg | ORAL_TABLET | Freq: Every day | ORAL | Status: DC
  Administered 2015-06-16: 20 mg via ORAL
  Filled 2015-06-15: qty 1

## 2015-06-15 MED ORDER — TRAMADOL HCL 50 MG PO TABS
50.0000 mg | ORAL_TABLET | Freq: Four times a day (QID) | ORAL | Status: DC | PRN
  Administered 2015-06-15 – 2015-06-16 (×2): 100 mg via ORAL
  Filled 2015-06-15 (×3): qty 2

## 2015-06-15 MED ORDER — VITAMIN B-6 50 MG PO TABS
50.0000 mg | ORAL_TABLET | Freq: Every day | ORAL | Status: DC
  Administered 2015-06-16: 50 mg via ORAL
  Filled 2015-06-15: qty 1

## 2015-06-15 MED ORDER — SUMATRIPTAN SUCCINATE 50 MG PO TABS
50.0000 mg | ORAL_TABLET | ORAL | Status: DC | PRN
  Administered 2015-06-16: 50 mg via ORAL
  Filled 2015-06-15 (×2): qty 1

## 2015-06-15 MED ORDER — ASPIRIN 325 MG PO TABS
325.0000 mg | ORAL_TABLET | Freq: Every day | ORAL | Status: DC
  Administered 2015-06-15 – 2015-06-16 (×2): 325 mg via ORAL
  Filled 2015-06-15 (×2): qty 1

## 2015-06-15 MED ORDER — MECLIZINE HCL 25 MG PO TABS: 25.0000 mg | ORAL_TABLET | Freq: Every day | ORAL | Status: DC | PRN

## 2015-06-15 MED ORDER — SERTRALINE HCL 50 MG PO TABS
100.0000 mg | ORAL_TABLET | Freq: Every day | ORAL | Status: DC
  Administered 2015-06-15 – 2015-06-16 (×2): 100 mg via ORAL
  Filled 2015-06-15 (×2): qty 2

## 2015-06-15 MED ORDER — TRAMADOL HCL 50 MG PO TABS
50.0000 mg | ORAL_TABLET | Freq: Four times a day (QID) | ORAL | Status: DC | PRN
  Administered 2015-06-15: 50 mg via ORAL
  Filled 2015-06-15: qty 1

## 2015-06-15 NOTE — ED Notes (Signed)
This RN informed Dr. Dayna Barker that pt states she takes Neurontin 4x a day.

## 2015-06-15 NOTE — ED Notes (Signed)
Pt requesting pain, "sleep", and more neurontin medications. Pt made aware she only has PRN Pain and Vertigo medications ordered.

## 2015-06-15 NOTE — ED Notes (Signed)
Pt given graham crackers as snack upon request.

## 2015-06-15 NOTE — ED Provider Notes (Signed)
CSN: [id number]     Arrival date & time 06/15/15  1224 History   First MD Initiated Contact with Patient 06/15/15 1334     Chief Complaint  Patient presents with  . Depression     (Consider location/radiation/quality/duration/timing/severity/associated sxs/prior Treatment) HPI   75 year old female with depression. Presenting today with her son. She reports increasing depression. She cannot identify any acute stressor. Her husband did die 6 years ago and she feels like she has not fully gone over this. In the past day she has called her son approximately a dozen times. At times she'll be happy and then the next will be crying hysterically. Isolating herself from family at times. At times she feels too overwhelmed to do simple things such as pay bills and even eat.  Reports a hospitalization approximately one half years ago for psychiatric reasons. She did not follow-up consistently after this though. Denies any substance abuse. No suicidal or homicidal ideations. Denies any hallucinations. Retired Health visitor.  Past Medical History  Diagnosis Date  . GERD (gastroesophageal reflux disease)     hx pud '98  . Arthritis     djd  . Restless leg syndrome     takes Sinemet daily  . 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 Dysrhythmia     hx tachy palpitations  . Neuromuscular disorder ([place])     peripheral neuropathy, FEET AND LEGS  . Stroke [facility])     ? 6 months ago - tests okay - [facility]   . Cellulitis     10/11/11 hospitalized for cellulitis   . Hyperlipemia     takes Atorvastatin daily  . Peripheral neuropathy ([place])   . Elbow fracture, left April 2015  . Depression     takes Zoloft daily  . Vertigo     takes Meclizine daily as needed  . Insomnia     takes restoril nightly as needed  . Hypertension     takes HCTZ daily  . Peripheral neuropathy (HCC)     takes Gabapentin daily  . History of bronchitis     many yrs ago  . Headache(784.0)     migraines yrs ago-takes Imitrex  daily  . Joint pain   . Joint swelling   . Chronic back pain     scoliosis/stenosis  . Anxiety     takes Ativan daily as needed  . Cataracts, bilateral     immature    Past Surgical History  Procedure Laterality Date  . Cervical disc surgery x2  2011  . Rt carotid enarterectomy  2011  . Rt knee arthroscopy  '99  . Left knee arthroscopy  2001  . Hematoma evacuation  2011    s/p rt cea  . Joint replacement  '01    total knee replacement, LEFT  . Abdominal hysterectomy  1975    bil oophorectomy  . Back surgery   MANY YRS AGO    laminectomy x3  . Cholecystectomy  1993  . Total knee arthroplasty  09/13/2011    Procedure: TOTAL KNEE ARTHROPLASTY;  Surgeon: Magnus Sinning, MD;  [facility];  Service: Orthopedics;  Laterality: Right;  . Right knee replacement      09/2011   . Carpal tunnel release  07/09/2012    Procedure: CARPAL TUNNEL RELEASE;  Surgeon: Magnus Sinning, MD;  [facility];  Service: Orthopedics;  Laterality: Left;  . Finger arthroplasty  07/09/2012    Procedure: FINGER  ARTHROPLASTY;  Surgeon: Magnus Sinning, MD;  [facility];  Service: Orthopedics;  Laterality: Left;  Interposition Arthroplasty CMC Joint Thumb Left   . Lipoma excision  07/09/2012    Procedure: EXCISION LIPOMA;  Surgeon: Magnus Sinning, MD;  [facility];  Service: Orthopedics;  Laterality: Left;  Excision Lipoma Dorsum Left Wrist   . Orif ankle fracture Right 09/14/2014    FIBULA   . Orif ankle fracture Right 09/14/2014    Procedure: OPEN REDUCTION INTERNAL FIXATION (ORIF) RIGHT ANKLE FRACTURE/SYNDESMOSIS ;  Surgeon: Wylene Simmer, MD;  Location: [place];  Service: Orthopedics;  Laterality: Right;  . Colonosocpy    . Esophagogastroduodenoscopy    . Lumbar laminectomy with coflex 1 level Bilateral 04/19/2015    Procedure: LUMBAR TWO-THREE LUMBAR LAMINECTOMY WITH COFLEX ;  Surgeon: Kristeen Miss, MD;  Location: [place] [facility];  Service: Neurosurgery;  Laterality: Bilateral;  Bilateral  L23 laminectomy and foraminotomy with coflex   Family History  Problem Relation Age of Onset  . Congestive Heart Failure Father   . Congestive Heart Failure Sister   . Alzheimer's disease Mother   . Diabetes Brother   . Arthritis Brother    Social History  Substance Use Topics  . Smoking status: Never Smoker   . Smokeless tobacco: Never Used  . Alcohol Use: No   OB History    No data available     Review of Systems  All systems reviewed and negative, other than as noted in HPI.   Allergies  Contrast media and Sulfa drugs cross reactors  Home Medications   Prior to Admission medications   Medication Sig Start Date End Date Taking? Authorizing Provider  acetaminophen (TYLENOL) 325 MG tablet Take 2 tablets (650 mg total) by mouth every 6 (six) hours as needed for mild pain (or Fever >/= 101). 09/16/14  Yes Wylene Simmer, MD  amoxicillin (AMOXIL) 500 MG capsule Take 2,000 mg by mouth See admin instructions. Must take 4 capsules 1 hour before dental procedures   Yes Historical Provider, MD  aspirin 325 MG tablet Take 325 mg by mouth daily.   Yes Historical Provider, MD  atorvastatin (LIPITOR) 40 MG tablet Take one tablet by mouth once daily for cholesterol Patient taking differently: Take 40 mg by mouth daily.  01/31/15  Yes [name] L [name], DO  calcium carbonate 1250 MG capsule Take 1,250 mg by mouth daily.    Yes Historical Provider, MD  cholecalciferol (VITAMIN D) 1000 UNITS tablet Take 1,000 Units by mouth daily.   Yes Historical Provider, MD  cloNIDine (CATAPRES) 0.1 MG tablet TAKE 1 TABLET BY MOUTH EVERY 12 HOURS AS NEEDED FOR SYSTOLIC BLOOD PRESSURE [id number] 05/23/15  Yes [name] L [name], DO  fluticasone (FLONASE) 50 MCG/ACT nasal spray SHAKE WELL AND USE 1 SPRAY IN EACH NOSTRIL DAILY AS NEEDED FOR ALLERGIES OR RHINITIS 11/24/14  Yes [name] L [name], DO  gabapentin (NEURONTIN) 600 MG tablet Take 1 tablet by mouth 4  times daily 06/13/15  Yes Kathrynn Ducking, MD  hydrochlorothiazide  (HYDRODIURIL) 25 MG tablet Take 1 tablet by mouth in  the morning Patient taking differently: Take 25 mg by mouth in  the morning 04/13/15  Yes [name] L [name], DO  HYDROcodone-acetaminophen (NORCO/VICODIN) 5-325 MG tablet Take 1 tablet by mouth every 6 (six) hours as needed for moderate pain. Must last 28 days 05/16/15  Yes Kathrynn Ducking, MD  lisinopril (PRINIVIL,ZESTRIL) 20 MG tablet TAKE 1 TABLET BY MOUTH EVERY DAY 06/07/15  Yes  [name] L [name], DO  LORazepam (ATIVAN) 1 MG tablet Take 1 tablet (1 mg total) by mouth every 8 (eight) hours as needed for anxiety. 06/13/15  Yes [name] L [name], DO  meclizine (ANTIVERT) 25 MG tablet Take one tablet by mouth once daily as needed for dizziness. Patient taking differently: Take 25 mg by mouth daily as needed for dizziness.  05/26/14  Yes [name] L [name], DO  Phenylephrine HCl (AFRIN ALLERGY NA) Place 2 sprays into the nose daily as needed (Nasal congestion).   Yes Historical Provider, MD  potassium chloride SA (K-DUR,KLOR-CON) 20 MEQ tablet Take 2 tablets by mouth  daily for potassium Patient taking differently: Take 1 tablet= 11mew  by mouth twice daily for potassium 06/13/15  Yes [name] L [name], DO  pyridOXINE (VITAMIN B-6) 50 MG tablet Take 50 mg by mouth daily.   Yes Historical Provider, MD  sertraline (ZOLOFT) 50 MG tablet Take 100 mg by mouth daily.   Yes Historical Provider, MD  SUMAtriptan (IMITREX) 50 MG tablet See AUX Label Patient taking differently: Take 50 mg by mouth as needed for migraine or headache 11/24/14  Yes [name] L [name], DO  temazepam (RESTORIL) 7.5 MG capsule Take 1 capsule (7.5 mg total) by mouth at bedtime as needed for sleep. 04/13/15  Yes [name] L [name], DO  traMADol (ULTRAM) 50 MG tablet Take 50 mg by mouth every 6 (six) hours as needed. pain 06/13/15  Yes Historical Provider, MD  baclofen (LIORESAL) 20 MG tablet Take 1 tablet (20 mg total) by mouth at bedtime. Patient not taking: Reported on 06/15/2015 04/21/15   Kristeen Miss, MD   Carbidopa-Levodopa ER (SINEMET CR) 25-100 MG tablet controlled release Take 1 tablet by mouth 2 (two) times daily. Patient not taking: Reported on 06/15/2015 04/21/15   Kristeen Miss, MD  sertraline (ZOLOFT) 100 MG tablet Take 1 tablet (100 mg total) by mouth daily. Patient not taking: Reported on 06/15/2015 04/22/15   Lauree Chandler, NP   BP 119/96 mmHg  Pulse 110  Temp(Src) 97.7 F (36.5 C) (Oral)  Resp 18  SpO2 97% Physical Exam  Constitutional: [name] oriented to person, place, and time. She appears well-developed and well-nourished. No distress.  HENT:  Head: Normocephalic and atraumatic.  Eyes: Conjunctivae are normal. Right eye exhibits no discharge. Left eye exhibits no discharge.  Neck: Neck supple.  Cardiovascular: Normal rate, regular rhythm and normal heart sounds.  Exam reveals no gallop and no friction rub.   No murmur heard. Pulmonary/Chest: Effort normal and breath sounds normal. No respiratory distress.  Abdominal: Soft. She exhibits no distension. There is no tenderness.  Musculoskeletal: She exhibits no edema or tenderness.  Neurological: [name] alert and oriented to person, place, and time.  Skin: Skin is warm and dry.  Psychiatric: Her behavior is normal. Thought content normal.  Flat affect. Crying at times. Speech is clear. Content is appropriate. Does not appear to be responding to internal stimuli.  Nursing note and vitals reviewed.   ED Course  Procedures (including critical care time) Labs Review Labs Reviewed  COMPREHENSIVE METABOLIC PANEL - Abnormal; Notable for the following:    Potassium 3.2 (*)    Chloride 92 (*)    Glucose, Bld 166 (*)    Creatinine, Ser 1.47 (*)    Total Protein 9.0 (*)    ALT <5 (*)    GFR calc non Af Amer 34 (*)    GFR calc Af Amer 40 (*)    Anion gap 19 (*)  All other components within normal limits  CBC WITH DIFFERENTIAL/PLATELET - Abnormal; Notable for the following:    WBC 13.2 (*)    HCT 48.3 (*)    Platelets  414 (*)    Neutro Abs 9.1 (*)    All other components within normal limits  ACETAMINOPHEN LEVEL - Abnormal; Notable for the following:    Acetaminophen (Tylenol), Serum <10 (*)    All other components within normal limits  ETHANOL  SALICYLATE LEVEL  URINE RAPID DRUG SCREEN, HOSP PERFORMED  URINALYSIS, ROUTINE W REFLEX MICROSCOPIC (NOT AT [facility])    Imaging Review No results found. I have personally reviewed and evaluated these images and lab results as part of my medical decision-making.   EKG Interpretation None      MDM   Final diagnoses:  Depression    75 year old female with increasing depression. Mild hypokalemia. She does have elevated anion gap. No metabolic acidosis though. She denies any acute ingestion. Doubt contributory. Clinically cleared. TTS evaluation.   Virgel Manifold, MD 06/15/15 [phone]

## 2015-06-15 NOTE — ED Notes (Addendum)
[name] [phone].  Isabella is adamant she is not to go home.

## 2015-06-15 NOTE — BH Assessment (Addendum)
Assessment Note  [name] is an 75 y.o. female presenting to [facility] brought by her son. The son was at bedside during the TTS assessment. Patient lives alone and called son today asking him to come over. The son arrived to patient home and he describes her as depressed. Patient's spouse passed away 6 yrs ago and patient has dealt with depression "on and off since". Her son reports that patient displays several vegetative symptoms: laying in the bed, fatigue, poor grooming. Son also sts that patient's room is a "disarray". Patient has not eaten in the last 3 days stating, "I just don't have a appetite or feel like making myself food". Patient not sleeping well for the past several months. Patient denies SI, HI, and AVH's. She has no history of SI, HI, and/or AVH's. No self mutilating behaviors. Patient denies family history of mental health illness. Patient denies alcohol and drug use. She does not have a current outpatient psychiatrist of therapist. Patient reports 1 prior inpatient admission to [facility] in the past for depression. Sts that she did not like the facility and does not wish to return.   Diagnosis: Depressive Disorder NOS   Past Medical History:  Past Medical History  Diagnosis Date  . GERD (gastroesophageal reflux disease)     hx pud '98  . Arthritis     djd  . Restless leg syndrome     takes Sinemet daily  . 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 Dysrhythmia     hx tachy palpitations  . Neuromuscular disorder ([place])     peripheral neuropathy, FEET AND LEGS  . Stroke [facility])     ? 6 months ago - tests okay - [facility]   . Cellulitis     10/11/11 hospitalized for cellulitis   . Hyperlipemia     takes Atorvastatin daily  . Peripheral neuropathy ([place])   . Elbow fracture, left April 2015  . Depression     takes Zoloft daily  . Vertigo     takes Meclizine daily as needed  . Insomnia     takes restoril nightly as needed  . Hypertension     takes HCTZ daily   . Peripheral neuropathy (HCC)     takes Gabapentin daily  . History of bronchitis     many yrs ago  . Headache(784.0)     migraines yrs ago-takes Imitrex daily  . Joint pain   . Joint swelling   . Chronic back pain     scoliosis/stenosis  . Anxiety     takes Ativan daily as needed  . Cataracts, bilateral     immature   . Depression     Past Surgical History  Procedure Laterality Date  . Cervical disc surgery x2  2011  . Rt carotid enarterectomy  2011  . Rt knee arthroscopy  '99  . Left knee arthroscopy  2001  . Hematoma evacuation  2011    s/p rt cea  . Joint replacement  '01    total knee replacement, LEFT  . Abdominal hysterectomy  1975    bil oophorectomy  . Back surgery   MANY YRS AGO    laminectomy x3  . Cholecystectomy  1993  . Total knee arthroplasty  09/13/2011    Procedure: TOTAL KNEE ARTHROPLASTY;  Surgeon: Magnus Sinning, MD;  [facility];  Service: Orthopedics;  Laterality: Right;  . Right knee replacement      09/2011   .  Carpal tunnel release  07/09/2012    Procedure: CARPAL TUNNEL RELEASE;  Surgeon: Magnus Sinning, MD;  [facility];  Service: Orthopedics;  Laterality: Left;  . Finger arthroplasty  07/09/2012    Procedure: FINGER ARTHROPLASTY;  Surgeon: Magnus Sinning, MD;  [facility];  Service: Orthopedics;  Laterality: Left;  Interposition Arthroplasty CMC Joint Thumb Left   . Lipoma excision  07/09/2012    Procedure: EXCISION LIPOMA;  Surgeon: Magnus Sinning, MD;  [facility];  Service: Orthopedics;  Laterality: Left;  Excision Lipoma Dorsum Left Wrist   . Orif ankle fracture Right 09/14/2014    FIBULA   . Orif ankle fracture Right 09/14/2014    Procedure: OPEN REDUCTION INTERNAL FIXATION (ORIF) RIGHT ANKLE FRACTURE/SYNDESMOSIS ;  Surgeon: Wylene Simmer, MD;  Location: [place];  Service: Orthopedics;  Laterality: Right;  . Colonosocpy    . Esophagogastroduodenoscopy    . Lumbar laminectomy with coflex 1 level Bilateral 04/19/2015     Procedure: LUMBAR TWO-THREE LUMBAR LAMINECTOMY WITH COFLEX ;  Surgeon: Kristeen Miss, MD;  Location: [place] NEUR[facility] ORS;  Service: Neurosurgery;  Laterality: Bilateral;  Bilateral L23 laminectomy and foraminotomy with coflex    Family History:  Family History  Problem Relation Age of Onset  . Congestive Heart Failure Father   . Congestive Heart Failure Sister   . Alzheimer's disease Mother   . Diabetes Brother   . Arthritis Brother     Social History:  reports that she has never smoked. She has never used smokeless tobacco. She reports that she does not drink alcohol or use illicit drugs.  Additional Social History:  Alcohol / Drug Use Pain Medications: SEE MAR Prescriptions: SEE MAR Over the Counter: SEE MAR History of alcohol / drug use?: No history of alcohol / drug abuse  CIWA: CIWA-Ar BP: 119/96 mmHg Pulse Rate: 110 COWS:    Allergies:  Allergies  Allergen Reactions  . Contrast Media [Iodinated Diagnostic Agents] Other (See Comments)    Respiratory failure  . Sulfa Drugs Cross Reactors Nausea And Vomiting    Home Medications:  (Not in a hospital admission)  OB/GYN Status:  No LMP recorded. Patient has had a hysterectomy.  General Assessment Data [facility] TTS Assessment: In system Is this a Tele or Face-to-Face Assessment?: Face-to-Face Is this an Initial Assessment or a Re-assessment for this encounter?: Initial Assessment Marital status: Widowed [place] name:  (n/a) Is patient pregnant?: No Pregnancy Status: No Living Arrangements: Alone Can pt return to current living arrangement?: No Admission Status: Voluntary Is patient capable of signing voluntary admission?: No Referral Source: Self/Family/Friend Insurance type:  [facility])     [place] Living Arrangements: Alone Legal Guardian:  (patient denies ) Name of Psychiatrist:  (Patient denies ) Name of Therapist:  (Patient denies )  Education Status Is patient currently in  school?: No Current Grade:  (n/a) Highest grade of school patient has completed:  (n/a) Name of school:  (n/a) Contact person:  (n/a)  Risk to self with the past 6 months Suicidal Ideation: No Has patient been a risk to self within the past 6 months prior to admission? : No Suicidal Intent: No Has patient had any suicidal intent within the past 6 months prior to admission? : No Is patient at risk for suicide?: No Suicidal Plan?: No Has patient had any suicidal plan within the past 6 months prior to admission? : No Access to Means: No What has been your use of drugs/alcohol  within the last 12 months?:  (n/a) Previous Attempts/Gestures: No How many times?:  (n/a) Other Self Harm Risks:  (n/a) Triggers for Past Attempts: Other (Comment) (patient denies ) Intentional Self Injurious Behavior: None Family Suicide History: No Recent stressful life event(s): Other (Comment) (death of spouse 6 yrs ago and "empty nest syndrome") Persecutory voices/beliefs?: No Depression: Yes Depression Symptoms: Feeling angry/irritable, Feeling worthless/self pity, Loss of interest in usual pleasures, Guilt, Fatigue, Isolating, Tearfulness, Insomnia, Despondent Substance abuse history and/or treatment for substance abuse?: No Suicide prevention information given to non-admitted patients: Not applicable  Risk to Others within the past 6 months Homicidal Ideation: No Does patient have any lifetime risk of violence toward others beyond the six months prior to admission? : No Thoughts of Harm to Others: No Current Homicidal Intent: No Current Homicidal Plan: No Access to Homicidal Means: No Identified Victim:  (n/a) History of harm to others?: No Assessment of Violence: None Noted Violent Behavior Description:  (patient is calm and cooperative ) Does patient have access to weapons?: No Criminal Charges Pending?: No Does patient have a court date: No Is patient on probation?:  No  Psychosis Hallucinations: None noted Delusions: None noted  Mental Status Report Appearance/Hygiene: In scrubs Eye Contact: Good Motor Activity: Freedom of movement Speech: Logical/coherent Level of Consciousness: Alert Mood: Depressed Affect: Appropriate to circumstance Anxiety Level: None Thought Processes: Coherent, Relevant Judgement: Impaired Orientation: Person, Place, Time, Situation Obsessive Compulsive Thoughts/Behaviors: None  Cognitive Functioning Concentration: Normal Memory: Remote Intact, Recent Intact IQ: Average Insight: Fair Impulse Control: Good Appetite: Poor Weight Loss:  (no food in the past 3 days ) Weight Gain:  (n/a) Sleep: Decreased Total Hours of Sleep:  (varies) Vegetative Symptoms: None  ADLScreening [facility] Assessment Services) Patient's cognitive ability adequate to safely complete daily activities?: Yes Patient able to express need for assistance with ADLs?: Yes Independently performs ADLs?: Yes (appropriate for developmental age)  Prior Inpatient Therapy Prior Inpatient Therapy: Yes Prior Therapy Dates:  (approx. 2 yrs ago ) Prior Therapy Facilty/Provider(s):  [facility]  Prior Outpatient Therapy Prior Outpatient Therapy: No Prior Therapy Dates:  (n/a) Prior Therapy Facilty/Provider(s):  (n/a) Reason for Treatment:  (n/a) Does patient have an ACCT team?: No Does patient have Intensive In-House Services?  : No Does patient have [facility] services? : No Does patient have P4CC services?: No  ADL Screening (condition at time of admission) Patient's cognitive ability adequate to safely complete daily activities?: Yes Is the patient deaf or have difficulty hearing?: No Does the patient have difficulty seeing, even when wearing glasses/contacts?: No Does the patient have difficulty concentrating, remembering, or making decisions?: No Patient able to express need for assistance with ADLs?: Yes Does the patient have difficulty dressing or  bathing?: No Independently performs ADLs?: Yes (appropriate for developmental age) Does the patient have difficulty walking or climbing stairs?: No Weakness of Legs: None Weakness of Arms/Hands: None  Home Assistive Devices/Equipment Home Assistive Devices/Equipment: None    Abuse/Neglect Assessment (Assessment to be complete while patient is alone) Physical Abuse: Denies Verbal Abuse: Denies Sexual Abuse: Denies Exploitation of patient/patient's resources: Denies Self-Neglect: Denies Values / Beliefs Cultural Requests During Hospitalization: None Spiritual Requests During Hospitalization: None   Advance Directives (For Healthcare) Does patient have an advance directive?: No Would patient like information on creating an advanced directive?: No - patient declined information    Additional Information 1:1 In Past 12 Months?: No CIRT Risk: No Elopement Risk: No Does patient have medical clearance?: Yes  Disposition:  Disposition Initial Assessment Completed for this Encounter: Yes Disposition of Patient: Other dispositions, Referred to (Patient meets criteria for Observation Unit at [facility] or [facility]) Type of inpatient treatment program: Adult  On Site Evaluation by:   Reviewed with Physician:    Evangeline Gula 06/15/2015 5:00 PM

## 2015-06-15 NOTE — ED Notes (Signed)
Pt states she is feeling depressed, has not ate in the past 3 days, and ADLs have just not been a priority right now. Pt states he husband died 15 years ago and son thinks she has not dealt with that properly. Pt went to [facility] last time and pt states she did not like it their. Denies SI/HI. Family involvement,

## 2015-06-15 NOTE — ED Notes (Addendum)
Pt given phone to make call. Pt made aware of need for urine sample.

## 2015-06-15 NOTE — ED Notes (Signed)
MD at bedside. 

## 2015-06-15 NOTE — ED Notes (Signed)
Attempted to draw labs and was unsuccessful nurse is going to attempt to draw labs.

## 2015-06-15 NOTE — ED Notes (Signed)
Made one attempt to collect samples,and missed.Pt may need fluids to collect samples

## 2015-06-16 ENCOUNTER — Encounter (HOSPITAL_COMMUNITY): Payer: Self-pay | Admitting: *Deleted

## 2015-06-16 ENCOUNTER — Inpatient Hospital Stay (HOSPITAL_COMMUNITY)
Admission: AD | Admit: 2015-06-16 | Discharge: 2015-06-28 | DRG: 885 | Disposition: A | Discharge status: Home / Self Care | Payer: 59 | Source: Intra-hospital | Attending: Psychiatry | Admitting: Psychiatry

## 2015-06-16 DIAGNOSIS — Z818 Family history of other mental and behavioral disorders: Secondary | ICD-10-CM

## 2015-06-16 DIAGNOSIS — Z8673 Personal history of transient ischemic attack (TIA), and cerebral infarction without residual deficits: Secondary | ICD-10-CM

## 2015-06-16 DIAGNOSIS — E7800 Pure hypercholesterolemia, unspecified: Secondary | ICD-10-CM | POA: Diagnosis present

## 2015-06-16 DIAGNOSIS — G43909 Migraine, unspecified, not intractable, without status migrainosus: Secondary | ICD-10-CM | POA: Diagnosis not present

## 2015-06-16 DIAGNOSIS — Z8781 Personal history of (healed) traumatic fracture: Secondary | ICD-10-CM | POA: Diagnosis not present

## 2015-06-16 DIAGNOSIS — F332 Major depressive disorder, recurrent severe without psychotic features: Secondary | ICD-10-CM | POA: Diagnosis not present

## 2015-06-16 DIAGNOSIS — K5900 Constipation, unspecified: Secondary | ICD-10-CM | POA: Diagnosis present

## 2015-06-16 DIAGNOSIS — E876 Hypokalemia: Secondary | ICD-10-CM | POA: Diagnosis not present

## 2015-06-16 DIAGNOSIS — G2581 Restless legs syndrome: Secondary | ICD-10-CM | POA: Diagnosis not present

## 2015-06-16 DIAGNOSIS — M1990 Unspecified osteoarthritis, unspecified site: Secondary | ICD-10-CM | POA: Diagnosis present

## 2015-06-16 DIAGNOSIS — K219 Gastro-esophageal reflux disease without esophagitis: Secondary | ICD-10-CM | POA: Diagnosis present

## 2015-06-16 DIAGNOSIS — R45851 Suicidal ideations: Secondary | ICD-10-CM | POA: Diagnosis present

## 2015-06-16 DIAGNOSIS — F419 Anxiety disorder, unspecified: Secondary | ICD-10-CM | POA: Diagnosis present

## 2015-06-16 DIAGNOSIS — Z79899 Other long term (current) drug therapy: Secondary | ICD-10-CM | POA: Diagnosis not present

## 2015-06-16 DIAGNOSIS — G4733 Obstructive sleep apnea (adult) (pediatric): Secondary | ICD-10-CM | POA: Diagnosis present

## 2015-06-16 DIAGNOSIS — G4700 Insomnia, unspecified: Secondary | ICD-10-CM | POA: Diagnosis not present

## 2015-06-16 DIAGNOSIS — Z8249 Family history of ischemic heart disease and other diseases of the circulatory system: Secondary | ICD-10-CM | POA: Diagnosis not present

## 2015-06-16 DIAGNOSIS — G629 Polyneuropathy, unspecified: Secondary | ICD-10-CM | POA: Diagnosis not present

## 2015-06-16 DIAGNOSIS — F329 Major depressive disorder, single episode, unspecified: Secondary | ICD-10-CM | POA: Diagnosis not present

## 2015-06-16 DIAGNOSIS — Z9119 Patient's noncompliance with other medical treatment and regimen: Secondary | ICD-10-CM | POA: Diagnosis not present

## 2015-06-16 DIAGNOSIS — Z96651 Presence of right artificial knee joint: Secondary | ICD-10-CM | POA: Diagnosis present

## 2015-06-16 DIAGNOSIS — G8929 Other chronic pain: Secondary | ICD-10-CM | POA: Diagnosis present

## 2015-06-16 DIAGNOSIS — N390 Urinary tract infection, site not specified: Secondary | ICD-10-CM | POA: Diagnosis present

## 2015-06-16 DIAGNOSIS — N179 Acute kidney failure, unspecified: Secondary | ICD-10-CM | POA: Diagnosis present

## 2015-06-16 DIAGNOSIS — R627 Adult failure to thrive: Secondary | ICD-10-CM | POA: Diagnosis present

## 2015-06-16 DIAGNOSIS — Z872 Personal history of diseases of the skin and subcutaneous tissue: Secondary | ICD-10-CM | POA: Diagnosis not present

## 2015-06-16 DIAGNOSIS — E785 Hyperlipidemia, unspecified: Secondary | ICD-10-CM | POA: Diagnosis present

## 2015-06-16 DIAGNOSIS — M549 Dorsalgia, unspecified: Secondary | ICD-10-CM | POA: Diagnosis present

## 2015-06-16 DIAGNOSIS — Z833 Family history of diabetes mellitus: Secondary | ICD-10-CM

## 2015-06-16 DIAGNOSIS — M545 Low back pain: Secondary | ICD-10-CM | POA: Diagnosis not present

## 2015-06-16 DIAGNOSIS — Z981 Arthrodesis status: Secondary | ICD-10-CM

## 2015-06-16 DIAGNOSIS — F1310 Sedative, hypnotic or anxiolytic abuse, uncomplicated: Secondary | ICD-10-CM | POA: Diagnosis not present

## 2015-06-16 DIAGNOSIS — I10 Essential (primary) hypertension: Secondary | ICD-10-CM | POA: Diagnosis present

## 2015-06-16 DIAGNOSIS — Z7982 Long term (current) use of aspirin: Secondary | ICD-10-CM | POA: Diagnosis not present

## 2015-06-16 LAB — URINALYSIS, ROUTINE W REFLEX MICROSCOPIC
Bilirubin Urine: NEGATIVE
Glucose, UA: NEGATIVE mg/dL
Hgb urine dipstick: NEGATIVE
Ketones, ur: NEGATIVE mg/dL
Nitrite: NEGATIVE
Protein, ur: NEGATIVE mg/dL
Specific Gravity, Urine: 1.018 (ref 1.005–1.030)
pH: 5.5 (ref 5.0–8.0)

## 2015-06-16 LAB — URINE MICROSCOPIC-ADD ON

## 2015-06-16 LAB — RAPID URINE DRUG SCREEN, HOSP PERFORMED
Amphetamines: NOT DETECTED
Barbiturates: NOT DETECTED
Benzodiazepines: POSITIVE — AB
Cocaine: NOT DETECTED
Opiates: NOT DETECTED
Tetrahydrocannabinol: NOT DETECTED

## 2015-06-16 MED ORDER — LIDOCAINE 5 % EX PTCH
1.0000 | MEDICATED_PATCH | Freq: Every day | CUTANEOUS | Status: DC
  Administered 2015-06-16: 1 via TRANSDERMAL
  Filled 2015-06-16: qty 1

## 2015-06-16 MED ORDER — SERTRALINE HCL 100 MG PO TABS
150.0000 mg | ORAL_TABLET | Freq: Every day | ORAL | Status: DC
  Administered 2015-06-17 – 2015-06-19 (×3): 150 mg via ORAL
  Filled 2015-06-16 (×5): qty 1

## 2015-06-16 MED ORDER — LISINOPRIL 20 MG PO TABS
20.0000 mg | ORAL_TABLET | Freq: Every day | ORAL | Status: DC
  Filled 2015-06-16: qty 1

## 2015-06-16 MED ORDER — ASPIRIN 325 MG PO TABS
325.0000 mg | ORAL_TABLET | Freq: Every day | ORAL | Status: DC
  Administered 2015-06-17 – 2015-06-28 (×12): 325 mg via ORAL
  Filled 2015-06-16 (×14): qty 1

## 2015-06-16 MED ORDER — LORAZEPAM 0.5 MG PO TABS
0.5000 mg | ORAL_TABLET | Freq: Three times a day (TID) | ORAL | Status: DC | PRN
  Administered 2015-06-16 – 2015-06-21 (×11): 0.5 mg via ORAL
  Filled 2015-06-16 (×11): qty 1

## 2015-06-16 MED ORDER — SERTRALINE HCL 50 MG PO TABS: 150.0000 mg | ORAL_TABLET | Freq: Every day | ORAL | Status: DC

## 2015-06-16 MED ORDER — LIDOCAINE 5 % EX PTCH
1.0000 | MEDICATED_PATCH | Freq: Every day | CUTANEOUS | Status: DC
  Administered 2015-06-17 – 2015-06-26 (×8): 1 via TRANSDERMAL
  Filled 2015-06-16 (×15): qty 1

## 2015-06-16 MED ORDER — CALCIUM CARBONATE 1250 (500 CA) MG PO TABS
1.0000 | ORAL_TABLET | Freq: Every day | ORAL | Status: DC
  Administered 2015-06-17 – 2015-06-28 (×12): 500 mg via ORAL
  Filled 2015-06-16 (×14): qty 1

## 2015-06-16 MED ORDER — ACETAMINOPHEN 325 MG PO TABS
650.0000 mg | ORAL_TABLET | Freq: Four times a day (QID) | ORAL | Status: DC | PRN
  Administered 2015-06-16 – 2015-06-17 (×3): 650 mg via ORAL
  Filled 2015-06-16 (×3): qty 2

## 2015-06-16 MED ORDER — HYDROCHLOROTHIAZIDE 25 MG PO TABS: 25.0000 mg | ORAL_TABLET | Freq: Every day | ORAL | Status: DC

## 2015-06-16 MED ORDER — TRAMADOL HCL 50 MG PO TABS: 50.0000 mg | ORAL_TABLET | Freq: Four times a day (QID) | ORAL | Status: DC | PRN

## 2015-06-16 MED ORDER — SUMATRIPTAN SUCCINATE 50 MG PO TABS
50.0000 mg | ORAL_TABLET | ORAL | Status: DC | PRN
Start: 2015-06-16 — End: 2015-06-29
  Administered 2015-06-16 – 2015-06-28 (×20): 50 mg via ORAL
  Filled 2015-06-16 (×20): qty 1

## 2015-06-16 MED ORDER — MECLIZINE HCL 25 MG PO TABS
25.0000 mg | ORAL_TABLET | Freq: Every day | ORAL | Status: DC | PRN
  Administered 2015-06-24 – 2015-06-28 (×3): 25 mg via ORAL
  Filled 2015-06-16 (×6): qty 1

## 2015-06-16 MED ORDER — ATORVASTATIN CALCIUM 40 MG PO TABS
40.0000 mg | ORAL_TABLET | Freq: Every day | ORAL | Status: DC
  Administered 2015-06-17 – 2015-06-28 (×12): 40 mg via ORAL
  Filled 2015-06-16 (×14): qty 1

## 2015-06-16 MED ORDER — GABAPENTIN 300 MG PO CAPS
600.0000 mg | ORAL_CAPSULE | Freq: Two times a day (BID) | ORAL | Status: DC
  Administered 2015-06-16 – 2015-06-17 (×2): 600 mg via ORAL
  Filled 2015-06-16 (×7): qty 2

## 2015-06-16 MED ORDER — VITAMIN B-6 50 MG PO TABS
50.0000 mg | ORAL_TABLET | Freq: Every day | ORAL | Status: DC
  Administered 2015-06-17 – 2015-06-28 (×12): 50 mg via ORAL
  Filled 2015-06-16 (×16): qty 1

## 2015-06-16 MED ORDER — GABAPENTIN 800 MG PO TABS: 400.0000 mg | ORAL_TABLET | Freq: Three times a day (TID) | ORAL | Status: DC

## 2015-06-16 MED ORDER — TRAMADOL HCL 50 MG PO TABS
50.0000 mg | ORAL_TABLET | Freq: Four times a day (QID) | ORAL | Status: DC | PRN
  Administered 2015-06-16 – 2015-06-17 (×3): 50 mg via ORAL
  Filled 2015-06-16 (×3): qty 1

## 2015-06-16 MED ORDER — PNEUMOCOCCAL VAC POLYVALENT 25 MCG/0.5ML IJ INJ
0.5000 mL | INJECTION | INTRAMUSCULAR | Status: AC
  Administered 2015-06-20: 0.5 mL via INTRAMUSCULAR

## 2015-06-16 MED ORDER — GABAPENTIN 300 MG PO CAPS: 600.0000 mg | ORAL_CAPSULE | Freq: Two times a day (BID) | ORAL | Status: DC

## 2015-06-16 MED ORDER — TEMAZEPAM 7.5 MG PO CAPS
7.5000 mg | ORAL_CAPSULE | Freq: Every evening | ORAL | Status: DC | PRN
  Administered 2015-06-17: 7.5 mg via ORAL
  Filled 2015-06-16: qty 1

## 2015-06-16 NOTE — Consult Note (Signed)
[name] Consult   Reason for Consult:  Depression Referring Physician:  EDP Patient Identification: [name] MRN:  [id number] Principal Diagnosis: Major depressive disorder, recurrent severe without psychotic features [facility]) Diagnosis:   Patient Active Problem List   Diagnosis Date Noted  . Major depressive disorder, recurrent severe without psychotic features ([place]) [F33.2] 08/07/2014    Priority: High  . Lumbar stenosis [M48.06] 04/19/2015  . Hypokalemia [E87.6] 09/19/2014  . Constipation [K59.00] 09/19/2014  . Acute encephalopathy [G93.40] 09/18/2014  . Acute respiratory failure ([place]) [J96.00] 09/17/2014  . OSA (obstructive sleep apnea) [G47.33] 09/17/2014  . Ankle fracture, lateral malleolus, closed [S82.63XA] 09/14/2014  . Insomnia [G47.00] 08/05/2014  . Hip joint painful on movement [M25.559] 08/05/2014  . Degenerative disc disease, lumbar [M51.36] 08/05/2014  . Severe obesity (BMI >= 40) ([place]) [E66.01] 01/11/2014  . Hyperlipemia [E78.5]   . Depression [F32.9]   . Arthritis [M19.90]   . Polyneuropathy in other diseases classified elsewhere ([place]) [G63] 04/08/2013  . Restless legs syndrome (RLS) [G25.81] 04/08/2013  . UTI (lower urinary tract infection) [N39.0] 10/14/2011  . Dizziness [R42] 10/12/2011  . Fall [W19.XXXA] 10/12/2011  . TIA (transient ischemic attack) [G45.9] 10/12/2011  . Cellulitis [L03.90] 10/12/2011  . HTN (hypertension) [I10] 10/12/2011  . GERD (gastroesophageal reflux disease) [K21.9] 10/12/2011    Total Time spent with patient: 45 minutes  Subjective:   [name] is a 75 y.o. female patient admitted with  Depression  HPI:  Caucasian female, 75 years old was evaluated for increased depressive feelings.   Patient reports that she is still Grieving the death of her husband 6 years ago and 18 years of empty nest.   She sees a Engineer, water for counseling and receives antidepressant from her PMD.    Patient reports that she  has been feeling "down in the dump"  She reports poor sleep and appetite and stated that she has been unable to complete any chore in the house.  Patient reports that she has had 7 surgeries since the death of her husband 6 years ago and she had back surgery last December.   Patient denies SI/HI/ AVH.  She has been accepted for admission and we will be seeking placement at any facility with available beds.  Past Psychiatric History:  MDD  Risk to Self: Suicidal Ideation: No Suicidal Intent: No Is patient at risk for suicide?: No Suicidal Plan?: No Access to Means: No What has been your use of drugs/alcohol within the last 12 months?:  (n/a) How many times?:  (n/a) Other Self Harm Risks:  (n/a) Triggers for Past Attempts: Other (Comment) (patient denies ) Intentional Self Injurious Behavior: None Risk to Others: Homicidal Ideation: No Thoughts of Harm to Others: No Current Homicidal Intent: No Current Homicidal Plan: No Access to Homicidal Means: No Identified Victim:  (n/a) History of harm to others?: No Assessment of Violence: None Noted Violent Behavior Description:  (patient is calm and cooperative ) Does patient have access to weapons?: No Criminal Charges Pending?: No Does patient have a court date: No Prior Inpatient Therapy: Prior Inpatient Therapy: Yes Prior Therapy Dates:  (approx. 2 yrs ago ) Prior Therapy Facilty/Provider(s):  [facility] Prior Outpatient Therapy: Prior Outpatient Therapy: No Prior Therapy Dates:  (n/a) Prior Therapy Facilty/Provider(s):  (n/a) Reason for Treatment:  (n/a) Does patient have an ACCT team?: No Does patient have Intensive In-House Services?  : No Does patient have [facility] services? : No Does patient have P4CC services?: No  Past Medical History:  Past Medical History  Diagnosis Date  . GERD (gastroesophageal reflux disease)     hx pud '98  . Arthritis     djd  . Restless leg syndrome     takes Sinemet daily  . PPD positive, treated  1987    tx'd x 1 yr w/ inh  . Dysrhythmia     hx tachy palpitations  . Neuromuscular disorder ([place])     peripheral neuropathy, FEET AND LEGS  . Stroke [facility])     ? 6 months ago - tests okay - [facility]   . Cellulitis     10/11/11 hospitalized for cellulitis   . Hyperlipemia     takes Atorvastatin daily  . Peripheral neuropathy ([place])   . Elbow fracture, left April 2015  . Depression     takes Zoloft daily  . Vertigo     takes Meclizine daily as needed  . Insomnia     takes restoril nightly as needed  . Hypertension     takes HCTZ daily  . Peripheral neuropathy ([facility])     takes Gabapentin daily  . History of bronchitis     many yrs ago  . Headache(784.0)     migraines yrs ago-takes Imitrex daily  . Joint pain   . Joint swelling   . Chronic back pain     scoliosis/stenosis  . Anxiety     takes Ativan daily as needed  . Cataracts, bilateral     immature   . Depression     Past Surgical History  Procedure Laterality Date  . Cervical disc surgery x2  2011  . Rt carotid enarterectomy  2011  . Rt knee arthroscopy  '99  . Left knee arthroscopy  2001  . Hematoma evacuation  2011    s/p rt cea  . Joint replacement  '01    total knee replacement, LEFT  . Abdominal hysterectomy  1975    bil oophorectomy  . Back surgery   MANY YRS AGO    laminectomy x3  . Cholecystectomy  1993  . Total knee arthroplasty  09/13/2011    Procedure: TOTAL KNEE ARTHROPLASTY;  Surgeon: Magnus Sinning, MD;  Location: [facility];  Service: Orthopedics;  Laterality: Right;  . Right knee replacement      09/2011   . Carpal tunnel release  07/09/2012    Procedure: CARPAL TUNNEL RELEASE;  Surgeon: Magnus Sinning, MD;  Location: [facility];  Service: Orthopedics;  Laterality: Left;  . Finger arthroplasty  07/09/2012    Procedure: FINGER ARTHROPLASTY;  Surgeon: Magnus Sinning, MD;  Location: [facility];  Service: Orthopedics;  Laterality: Left;  Interposition Arthroplasty [facility] Joint Thumb Left   . Lipoma excision   07/09/2012    Procedure: EXCISION LIPOMA;  Surgeon: Magnus Sinning, MD;  Location: [facility];  Service: Orthopedics;  Laterality: Left;  Excision Lipoma Dorsum Left Wrist   . Orif ankle fracture Right 09/14/2014    FIBULA   . Orif ankle fracture Right 09/14/2014    Procedure: OPEN REDUCTION INTERNAL FIXATION (ORIF) RIGHT ANKLE FRACTURE/SYNDESMOSIS ;  Surgeon: Wylene Simmer, MD;  Location: [facility];  Service: Orthopedics;  Laterality: Right;  . Colonosocpy    . Esophagogastroduodenoscopy    . Lumbar laminectomy with coflex 1 level Bilateral 04/19/2015    Procedure: LUMBAR TWO-THREE LUMBAR LAMINECTOMY WITH COFLEX ;  Surgeon: Kristeen Miss, MD;  Location: [place] [facility];  Service: Neurosurgery;  Laterality: Bilateral;  Bilateral L23 laminectomy and foraminotomy with coflex  Family History:  Family History  Problem Relation Age of Onset  . Congestive Heart Failure Father   . Congestive Heart Failure Sister   . Alzheimer's disease Mother   . Diabetes Brother   . Arthritis Brother    Family Psychiatric  History: Denies Social History:  History  Alcohol Use No     History  Drug Use No    Social History   Social History  . Marital Status: Widowed    Spouse Name: N/A  . Number of Children: 2  . Years of Education: 44   Social History Main Topics  . Smoking status: Never Smoker   . Smokeless tobacco: Never Used  . Alcohol Use: No  . Drug Use: No  . Sexual Activity: Yes    Birth Control/ Protection: Surgical   Other Topics Concern  . None   Social History Narrative   Patient is widowed and lives alone.   Patient has two sons.   Patient is retired.   Patient has a college education.   Patient is right-handed.   Patient drinks three glasses of Coke-soda daily.         Additional Social History:    Pain Medications: SEE MAR Prescriptions: SEE MAR Over the Counter: SEE MAR History of alcohol / drug use?: No history of alcohol / drug abuse                     Allergies:    Allergies  Allergen Reactions  . Contrast Media [Iodinated Diagnostic Agents] Other (See Comments)    Respiratory failure  . Acyclovir And Related   . Sulfa Drugs Cross Reactors Nausea And Vomiting    Labs:  Results for orders placed or performed during the hospital encounter of 06/15/15 (from the past 48 hour(s))  Comprehensive metabolic panel     Status: Abnormal   Collection Time: 06/15/15  1:31 PM  Result Value Ref Range   Sodium 137 135 - 145 mmol/L   Potassium 3.2 (L) 3.5 - 5.1 mmol/L   Chloride 92 (L) 101 - 111 mmol/L   CO2 26 22 - 32 mmol/L   Glucose, Bld 166 (H) 65 - 99 mg/dL   BUN 15 6 - 20 mg/dL   Creatinine, Ser 1.47 (H) 0.44 - 1.00 mg/dL   Calcium 10.3 8.9 - 10.3 mg/dL   Total Protein 9.0 (H) 6.5 - 8.1 g/dL   Albumin 4.8 3.5 - 5.0 g/dL   AST 33 15 - 41 U/L   ALT <5 (L) 14 - 54 U/L    Comment: REPEATED TO VERIFY   Alkaline Phosphatase 107 38 - 126 U/L   Total Bilirubin 0.8 0.3 - 1.2 mg/dL   GFR calc non Af Amer 34 (L) >60 mL/min   GFR calc Af Amer 40 (L) >60 mL/min    Comment: (NOTE) The eGFR has been calculated using the CKD EPI equation. This calculation has not been validated in all clinical situations. eGFR's persistently <60 mL/min signify possible Chronic Kidney Disease.    Anion gap 19 (H) 5 - 15  CBC with Diff     Status: Abnormal   Collection Time: 06/15/15  1:31 PM  Result Value Ref Range   WBC 13.2 (H) 4.0 - 10.5 K/uL   RBC 4.96 3.87 - 5.11 MIL/uL   Hemoglobin 15.0 12.0 - 15.0 g/dL   HCT 48.3 (H) 36.0 - 46.0 %   MCV 97.4 78.0 - 100.0 fL   MCH 30.2 26.0 - 34.0  pg   MCHC 31.1 30.0 - 36.0 g/dL   RDW 13.8 11.5 - 15.5 %   Platelets 414 (H) 150 - 400 K/uL   Neutrophils Relative % 69 %   Neutro Abs 9.1 (H) 1.7 - 7.7 K/uL   Lymphocytes Relative 23 %   Lymphs Abs 3.1 0.7 - 4.0 K/uL   Monocytes Relative 7 %   Monocytes Absolute 0.9 0.1 - 1.0 K/uL   Eosinophils Relative 1 %   Eosinophils Absolute 0.2 0.0 - 0.7 K/uL   Basophils Relative 0 %    Basophils Absolute 0.0 0.0 - 0.1 K/uL  Ethanol     Status: None   Collection Time: 06/15/15  1:32 PM  Result Value Ref Range   Alcohol, Ethyl (B) <5 <5 mg/dL    Comment:        LOWEST DETECTABLE LIMIT FOR SERUM ALCOHOL IS 5 mg/dL FOR MEDICAL PURPOSES ONLY   Acetaminophen level     Status: Abnormal   Collection Time: 06/15/15  1:32 PM  Result Value Ref Range   Acetaminophen (Tylenol), Serum <10 (L) 10 - 30 ug/mL    Comment:        THERAPEUTIC CONCENTRATIONS VARY SIGNIFICANTLY. A RANGE OF 10-30 ug/mL MAY BE AN EFFECTIVE CONCENTRATION FOR MANY PATIENTS. HOWEVER, SOME ARE BEST TREATED AT CONCENTRATIONS OUTSIDE THIS RANGE. ACETAMINOPHEN CONCENTRATIONS >150 ug/mL AT 4 HOURS AFTER INGESTION AND >50 ug/mL AT 12 HOURS AFTER INGESTION ARE OFTEN ASSOCIATED WITH TOXIC REACTIONS.   Salicylate level     Status: None   Collection Time: 06/15/15  1:32 PM  Result Value Ref Range   Salicylate Lvl <6.0 2.8 - 30.0 mg/dL    Current Facility-Administered Medications  Medication Dose Route Frequency Provider Last Rate Last Dose  . acetaminophen (TYLENOL) tablet 650 mg  650 mg Oral Q6H PRN Merrily Pew, MD      . aspirin tablet 325 mg  325 mg Oral Daily Merrily Pew, MD   325 mg at 06/15/15 2001  . atorvastatin (LIPITOR) tablet 40 mg  40 mg Oral Daily Merrily Pew, MD   40 mg at 06/15/15 2045  . calcium carbonate (OS-CAL - dosed in mg of elemental calcium) tablet 500 mg of elemental calcium  1 tablet Oral Q breakfast Merrily Pew, MD      . gabapentin (NEURONTIN) capsule 600 mg  600 mg Oral BID Alic Hilburn      . hydrochlorothiazide (HYDRODIURIL) tablet 25 mg  25 mg Oral Daily Merrily Pew, MD   25 mg at 06/16/15 0946  . lidocaine (LIDODERM) 5 % 1 patch  1 patch Transdermal Daily Virgel Manifold, MD   1 patch at 06/16/15 0946  . lisinopril (PRINIVIL,ZESTRIL) tablet 20 mg  20 mg Oral Daily Merrily Pew, MD   20 mg at 06/16/15 0947  . meclizine (ANTIVERT) tablet 25 mg  25 mg Oral Daily PRN Merrily Pew, MD      . pyridOXINE (VITAMIN B-6) tablet 50 mg  50 mg Oral Daily Merrily Pew, MD   50 mg at 06/16/15 0947  . [START ON 06/17/2015] sertraline (ZOLOFT) tablet 150 mg  150 mg Oral Daily Lachae Hohler      . SUMAtriptan (IMITREX) tablet 50 mg  50 mg Oral Q2H PRN Merrily Pew, MD   50 mg at 06/16/15 0307  . temazepam (RESTORIL) capsule 7.5 mg  7.5 mg Oral QHS PRN Merrily Pew, MD   7.5 mg at 06/15/15 2322  . traMADol (ULTRAM) tablet 50-100 mg  50-100 mg  Oral Q6H PRN Merrily Pew, MD   100 mg at 06/16/15 0601   Current Outpatient Prescriptions  Medication Sig Dispense Refill  . acetaminophen (TYLENOL) 325 MG tablet Take 2 tablets (650 mg total) by mouth every 6 (six) hours as needed for mild pain (or Fever >/= 101).    Marland Kitchen amoxicillin (AMOXIL) 500 MG capsule Take 2,000 mg by mouth See admin instructions. Must take 4 capsules 1 hour before dental procedures    . aspirin 325 MG tablet Take 325 mg by mouth daily.    Marland Kitchen atorvastatin (LIPITOR) 40 MG tablet Take one tablet by mouth once daily for cholesterol (Patient taking differently: Take 40 mg by mouth daily. ) 90 tablet 3  . calcium carbonate 1250 MG capsule Take 1,250 mg by mouth daily.     . cholecalciferol (VITAMIN D) 1000 UNITS tablet Take 1,000 Units by mouth daily.    . cloNIDine (CATAPRES) 0.1 MG tablet TAKE 1 TABLET BY MOUTH EVERY 12 HOURS AS NEEDED FOR SYSTOLIC BLOOD PRESSURE >197 30 tablet 5  . fluticasone (FLONASE) 50 MCG/ACT nasal spray SHAKE WELL AND USE 1 SPRAY IN EACH NOSTRIL DAILY AS NEEDED FOR ALLERGIES OR RHINITIS 48 g 1  . gabapentin (NEURONTIN) 600 MG tablet Take 1 tablet by mouth 4  times daily 360 tablet 0  . hydrochlorothiazide (HYDRODIURIL) 25 MG tablet Take 1 tablet by mouth in  the morning (Patient taking differently: Take 25 mg by mouth in  the morning) 90 tablet 1  . HYDROcodone-acetaminophen (NORCO/VICODIN) 5-325 MG tablet Take 1 tablet by mouth every 6 (six) hours as needed for moderate pain. Must last 28 days 60  tablet 0  . lisinopril (PRINIVIL,ZESTRIL) 20 MG tablet TAKE 1 TABLET BY MOUTH EVERY DAY 30 tablet 2  . LORazepam (ATIVAN) 1 MG tablet Take 1 tablet (1 mg total) by mouth every 8 (eight) hours as needed for anxiety. 90 tablet 2  . meclizine (ANTIVERT) 25 MG tablet Take one tablet by mouth once daily as needed for dizziness. (Patient taking differently: Take 25 mg by mouth daily as needed for dizziness. ) 30 tablet 0  . Phenylephrine HCl (AFRIN ALLERGY NA) Place 2 sprays into the nose daily as needed (Nasal congestion).    . potassium chloride SA (K-DUR,KLOR-CON) 20 MEQ tablet Take 2 tablets by mouth  daily for potassium (Patient taking differently: Take 1 tablet= 10mw  by mouth twice daily for potassium) 180 tablet 2  . pyridOXINE (VITAMIN B-6) 50 MG tablet Take 50 mg by mouth daily.    . sertraline (ZOLOFT) 50 MG tablet Take 100 mg by mouth daily.    . SUMAtriptan (IMITREX) 50 MG tablet See AUX Label (Patient taking differently: Take 50 mg by mouth as needed for migraine or headache) 30 tablet 2  . temazepam (RESTORIL) 7.5 MG capsule Take 1 capsule (7.5 mg total) by mouth at bedtime as needed for sleep. 90 capsule 1  . traMADol (ULTRAM) 50 MG tablet Take 50 mg by mouth every 6 (six) hours as needed. pain    . [DISCONTINUED] potassium chloride 20 MEQ TBCR Take 20 mEq by mouth daily. 3 tablet 0    Musculoskeletal: Strength & Muscle Tone: within normal limits Gait & Station: normal Patient leans: N/A  Psychiatric Specialty Exam: Review of Systems  Constitutional: Negative.   HENT: Negative.   Eyes: Negative.   Respiratory: Negative.   Cardiovascular: Negative.   Gastrointestinal: Negative.   Genitourinary: Negative.   Musculoskeletal: Negative.   Skin: Negative.  Neurological: Negative.   Endo/Heme/Allergies: Negative.   see documented PMH.  Blood pressure 118/72, pulse 96, temperature 98.3 F (36.8 C), temperature source Oral, resp. rate 18, SpO2 99 %.There is no weight on file to  calculate BMI.  General Appearance: Casual and Fairly Groomed  Eye Contact::  Good  Speech:  Clear and Coherent and Normal Rate  Volume:  Normal  Mood:  Depressed  Affect:  Congruent, Depressed and Flat  Thought Process:  Coherent, Goal Directed and Intact  Orientation:  Full (Time, Place, and Person)  Thought Content:  WDL  Suicidal Thoughts:  No  Homicidal Thoughts:  No  Memory:  Immediate;   Good Recent;   Good Remote;   Good  Judgement:  Good  Insight:  Good  Psychomotor Activity:  Psychomotor Retardation  Concentration:  Good  Recall:  Good  Fund of Knowledge:Good  Language: Good  Akathisia:  NA  Handed:  Right  AIMS (if indicated):     Assets:  Desire for Improvement  ADL's:  Intact  Cognition: WNL  Sleep:      Treatment Plan Summary: Daily contact with patient to assess and evaluate symptoms and progress in treatment and Medication management  Disposition: Accepted for admission and we will be seeking placement at any facility with available bed.  We have resumed her home medications.  Delfin Gant    PMHNP-BC 06/16/2015 11:57 AM Patient seen face-to-face for psychiatric evaluation, chart reviewed and case discussed with the physician extender and developed treatment plan. Reviewed the information documented and agree with the treatment plan. Corena Pilgrim, MD

## 2015-06-16 NOTE — Progress Notes (Signed)
Patient ID: [name], female   DOB: 1941/01/09, 76 y.o.   MRN: [id number]  D: Patient given neurontin 600mg  and Tramadol 50mg  when she got back to the unit. Patient didn't remember taking it 30 minutes later and also told son that we didn't give her the medication. Assured the son that her medications were given. Son reports that she can be manipulative at times. Patient told several times that she got the medications but she forgets soon after you tell her. Patient very med focused on admission here. Stressed to her that we will give her the prescription medications ordered from home but she was medically cleared to come from emergency room and we will focus mostly on her depression issues. Patient anxious and agitated when brought back here. A: NP notified of patient issues and her anxiety/agitation. R: Patient given ativan at this time and 5 minutes later couldn't remember the medication we just give her.

## 2015-06-16 NOTE — BH Assessment (Signed)
Patient accepted to [facility]. The accepting provider(s) are Dr. Darleene Cleaver and Reginold Agent, NP. The attending provider at [facility] will be Dr. Parke Poisson. Nursing report  [phone]. Patient is voluntary and has complete support paperwork. Faxed support paperwork to [facility] TTS department @ [phone]. Patient's nurse made aware of updated disposition. [facility] will provide patients transportation to [facility]. Nursing staff will need to make transportation arrangements.

## 2015-06-16 NOTE — Progress Notes (Signed)
ED CM spoke with Dr Wilson Singer about SAPPU progression recommendations to re check pt medically and provide interventions medically This will assist with clarification for referrals to community faciilties Dr Wilson Singer agreed to re evaluate pt medically SAPPU and TTS updated

## 2015-06-16 NOTE — Progress Notes (Signed)
Pt is a 75 y/o caucasian female admitted to [facility] 400 hall for continuation of treatment with diagnosis of MDD. Pt presented with depressed affect and was tearful on initial contact. Per pt "My son brought me to the ED because I stopped eating and drinking about 3 days ago".  Pt stated that she lives at home alone since her husband's death 6 years ago, "I have not gotten over my husband's death but my sons think it's empty nest syndrome". Pt denied SI, HI and AVH at time of assessment, but states she does feel unsafe being at home at this time. Pt stated her pain in right ankle was 10/10 and was encouraged to wait till orders are confirmed to be given pain medications. Pt reported multiple surgeries in the past years related to back and recently right ankle surgery ("plate in right ankle") due to a fall while getting out of her bathtub last April. Skin is intact without areas of break down. Pt's belongings searched as per protocol, no contraband found. Q 15 minutes checks maintained for safety without gestures or incident of self injurious behavior to note thus far this shift.

## 2015-06-16 NOTE — ED Notes (Signed)
I spoke to MD Palumbo about seeing this pt.  MD Palumbo says she is very busy now and it will have to wait.

## 2015-06-16 NOTE — Tx Team (Signed)
Initial Interdisciplinary Treatment Plan   PATIENT STRESSORS: Health problems Loss of husband 6 years ago Traumatic event   PATIENT STRENGTHS: Ability for insight Average or above average intelligence Capable of independent living Communication skills Motivation for treatment/growth Supportive family/friends Work skills   PROBLEM LIST: Problem List/Patient Goals Date to be addressed Date deferred Reason deferred Estimated date of resolution  Alteration in Mood (Depression, anxiety) "I have not gotten over my husband's death" July 03, 2015     Alteration in nutritional intake 2015-07-03     Alteration in sleep pattern Jul 03, 2015                                          DISCHARGE CRITERIA:  Ability to meet basic life and health needs Improved stabilization in mood, thinking, and/or behavior Motivation to continue treatment in a less acute level of care Reduction of life-threatening or endangering symptoms to within safe limits Verbal commitment to aftercare and medication compliance  PRELIMINARY DISCHARGE PLAN: Outpatient therapy Return to previous living arrangement  PATIENT/FAMIILY INVOLVEMENT: This treatment plan has been presented to and reviewed with the patient, [name].  The patient and family have been given the opportunity to ask questions and make suggestions.  Keane Police Jul 03, 2015, 5:55 PM

## 2015-06-16 NOTE — ED Notes (Signed)
Pt refused to let me get her vital signs at this time.  Sts she wants to see the doctor.

## 2015-06-16 NOTE — ED Notes (Addendum)
Pt was very irrate that she felt her pain issue was not addressed. Pt was informed that she had received tramadol for pain. EDP was phoned to order a lidocaine patch . Pt was very appreciative. She did eat 100% of her breakfast. Pt denies SI or HI but does appear very depressed this am. She stated she had back surgery in December and had a fx ankle in April. Pt appears very discouraged from all the pain and lack of sleep. She does contract for safety. 9:30a- Pt states her back pain is now down to a 4/10. She stated, "please just let me sleep. " pt refused to get washed up and did not want to brush her teeth after much encouragement. 1p-Pt is very tearful and crying. [name] requesting that the door remains closed. Pt was told she will be going over to [facility] the 400 hall. She appeared happy about this. Phoned [facility] at 1:05pm-Report to [facility], RN. (1:10pm)pt will eat lunch and then will be transferred across the street. Pt gave [name] a urine sample and it was sent at 2:10pm-Pt is insisting on getting neurotin qid. Spoke with NP . Called [place] over at [facility] to make her aware. 2;20P-Pt stated she did not want to eat.Phoned [facility] for transport.

## 2015-06-16 NOTE — Progress Notes (Signed)
Pt did not attend wrap up group meeting but was invited by the tech.

## 2015-06-17 DIAGNOSIS — F332 Major depressive disorder, recurrent severe without psychotic features: Principal | ICD-10-CM

## 2015-06-17 MED ORDER — ENSURE ENLIVE PO LIQD
237.0000 mL | ORAL | Status: DC
  Administered 2015-06-19 – 2015-06-21 (×2): 237 mL via ORAL

## 2015-06-17 MED ORDER — POTASSIUM CHLORIDE CRYS ER 10 MEQ PO TBCR
10.0000 meq | EXTENDED_RELEASE_TABLET | Freq: Every day | ORAL | Status: DC
  Administered 2015-06-18 – 2015-06-28 (×10): 10 meq via ORAL
  Filled 2015-06-17 (×13): qty 1

## 2015-06-17 MED ORDER — GABAPENTIN 300 MG PO CAPS: 600.0000 mg | ORAL_CAPSULE | Freq: Four times a day (QID) | ORAL | Status: DC

## 2015-06-17 MED ORDER — DOXYCYCLINE HYCLATE 100 MG PO TABS
100.0000 mg | ORAL_TABLET | Freq: Two times a day (BID) | ORAL | Status: DC
  Administered 2015-06-17 – 2015-06-28 (×22): 100 mg via ORAL
  Filled 2015-06-17 (×27): qty 1

## 2015-06-17 MED ORDER — GABAPENTIN 300 MG PO CAPS
600.0000 mg | ORAL_CAPSULE | Freq: Three times a day (TID) | ORAL | Status: DC
  Administered 2015-06-17 – 2015-06-21 (×12): 600 mg via ORAL
  Filled 2015-06-17 (×19): qty 2

## 2015-06-17 MED ORDER — GABAPENTIN 300 MG PO CAPS
600.0000 mg | ORAL_CAPSULE | Freq: Three times a day (TID) | ORAL | Status: DC
  Filled 2015-06-17 (×2): qty 2

## 2015-06-17 MED ORDER — TRAMADOL HCL 50 MG PO TABS
100.0000 mg | ORAL_TABLET | Freq: Three times a day (TID) | ORAL | Status: DC | PRN
  Administered 2015-06-17: 100 mg via ORAL
  Administered 2015-06-17: 50 mg via ORAL
  Administered 2015-06-18 – 2015-06-21 (×8): 100 mg via ORAL
  Filled 2015-06-17 (×5): qty 2
  Filled 2015-06-17: qty 1
  Filled 2015-06-17 (×5): qty 2

## 2015-06-17 NOTE — Progress Notes (Signed)
Pt did not attend wrap up group this evening.  

## 2015-06-17 NOTE — Progress Notes (Signed)
Recreation Therapy Notes  Date: 01.13.2017  Time: 9:30am Location: 300 Group Room   Group Topic: Stress Management  Goal Area(s) Addresses:  Patient will actively participate in stress management techniques presented during session.   Behavioral Response: Did not attend.   Laureen Ochs Abem Shaddix, LRT/CTRS        Maeve Debord L 06/17/2015 4:02 PM

## 2015-06-17 NOTE — Progress Notes (Signed)
NUTRITION ASSESSMENT  Pt identified as at risk on the Malnutrition Screen Tool  INTERVENTION: 1. Educated patient on the importance of nutrition and encouraged intake of food and beverages. 2. Discussed weight goals. 3. Supplements: will order Ensure Enlive po once/day, this supplement provides 350 kcal and 20 grams of protein  NUTRITION DIAGNOSIS: Unintentional weight loss related to sub-optimal intake as evidenced by pt report.   Goal: Pt to meet >/= 90% of their estimated nutrition needs.  Monitor:  PO intake  Assessment:  Pt seen for MST. Pt admitted for MDD. He reports that he did not eat anything during the 3 days PTA. Pt eating well since admission. Pt has been losing weight over the past few months due to depression.  Per chart review, pt has lost 22 lbs (10% body weight) in the past 5 months which is significant for time frame.  Will order Ensure Enlive once/day to supplement meals and snacks.  75 y.o. female  Height: Ht Readings from Last 1 Encounters:  06/16/15 5\' 3"  (1.6 m)    Weight: Wt Readings from Last 1 Encounters:  06/16/15 196 lb (88.905 kg)    Weight Hx: Wt Readings from Last 10 Encounters:  06/16/15 196 lb (88.905 kg)  04/19/15 203 lb (92.08 kg)  04/09/15 203 lb (92.08 kg)  01/06/15 218 lb (98.884 kg)  12/03/14 220 lb 12.8 oz (100.154 kg)  12/02/14 220 lb (99.791 kg)  11/05/14 230 lb (104.327 kg)  10/08/14 230 lb 3.2 oz (104.418 kg)  09/22/14 219 lb 12.8 oz (99.701 kg)  09/21/14 220 lb (99.791 kg)    BMI:  Body mass index is 34.73 kg/(m^2). Pt meets criteria for obesity based on current BMI.  Estimated Nutritional Needs: Kcal: 25-30 kcal/kg Protein: > 1 gram protein/kg Fluid: 1 ml/kcal  Diet Order: Diet Carb Modified Fluid consistency:: Thin; Room service appropriate?: Yes Pt is also offered choice of unit snacks mid-morning and mid-afternoon.  Pt is eating as desired.   Lab results and medications reviewed.     Jarome Matin,  RD, LDN Inpatient Clinical Dietitian Pager # [phone] After hours/weekend pager # [phone]

## 2015-06-17 NOTE — [facility] Group Notes (Signed)
[place] LCSW Group Therapy 06/17/2015 1:15pm  Type of Therapy: Group Therapy- Feelings Around Relapse and Recovery  Pt did not attend, declined invitation.   Peri Maris, [place] 06/17/2015 4:35 PM

## 2015-06-17 NOTE — H&P (Addendum)
Psychiatric Admission Assessment Adult  Patient Identification: [name] MRN:  [id number] Date of Evaluation:  06/17/2015 Chief Complaint:  " I have been depressed" Principal Diagnosis:  Major Depression, Recurrent, no Psychotic Features  Patient Active Problem List   Diagnosis Date Noted  . MDD (major depressive disorder), recurrent episode, severe ([place]) [F33.2] 06/16/2015  . Lumbar stenosis [M48.06] 04/19/2015  . Hypokalemia [E87.6] 09/19/2014  . Constipation [K59.00] 09/19/2014  . Acute encephalopathy [G93.40] 09/18/2014  . Acute respiratory failure ([place]) [J96.00] 09/17/2014  . OSA (obstructive sleep apnea) [G47.33] 09/17/2014  . Ankle fracture, lateral malleolus, closed [S82.63XA] 09/14/2014  . Major depressive disorder, recurrent severe without psychotic features ([place]) [F33.2] 08/07/2014  . Insomnia [G47.00] 08/05/2014  . Hip joint painful on movement [M25.559] 08/05/2014  . Degenerative disc disease, lumbar [M51.36] 08/05/2014  . Severe obesity (BMI >= 40) ([place]) [E66.01] 01/11/2014  . Hyperlipemia [E78.5]   . Depression [F32.9]   . Arthritis [M19.90]   . Polyneuropathy in other diseases classified elsewhere ([place]) [G63] 04/08/2013  . Restless legs syndrome (RLS) [G25.81] 04/08/2013  . UTI (lower urinary tract infection) [N39.0] 10/14/2011  . Dizziness [R42] 10/12/2011  . Fall [W19.XXXA] 10/12/2011  . TIA (transient ischemic attack) [G45.9] 10/12/2011  . Cellulitis [L03.90] 10/12/2011  . HTN (hypertension) [I10] 10/12/2011  . GERD (gastroesophageal reflux disease) [K21.9] 10/12/2011   History of Present Illness:: 75 year old widowed female, who reports worsening depression. States her adult son expressed concern regarding how depressed she was , and chart notes indicate that these was concern about decreasing attention to ADLs as well.  Son  brought her to the hospital. She describes chronic depression, which has been worsening. She states she has been isolating, keeping  to herself, socializing less than before.   Describes neuro-vegetative symptoms of depression as below. She had a surgery for an ankle fracture last year, after which she needed to ambulate in wheel chair for a period of time, later progressing to a walker, which is what she has been using at home up to admission. States that chronic pain is a contributor to her depression. At this time patient is focused on medication issues, and  reports she is very concerned because she is not getting neurontin, ultram  At doses that she was taking prior to admission and that therefore her pain has intensified since admission. States that because of increased pain she is even more depressed now. As discussed with staff, patient has mostly been in bed since admission, with limited group /milieu interaction. Associated Signs/Symptoms: Depression Symptoms:  depressed mood, anhedonia, loss of energy/fatigue, decreased appetite, decreased sense of self esteem (Hypo) Manic Symptoms:  none  Anxiety Symptoms:  describes worry, anxiety Psychotic Symptoms:  does not endorse and does not present with psychotic symptoms at this time PTSD Symptoms: does not endorse  Total Time spent with patient: 45 minutes  Past Psychiatric History:  Patient describes history of depression.  She describes a previous psychiatric admission several years ago due to depression. Does not endorse history of mania or of psychosis. States she has never attempted suicide . Denies any history of self cutting, or other self injurious behaviors, describes some free floating anxiety, denies panic or agoraphobia. State she had been on Xanax in the past, denies abusing it, but describes being admitted in order to taper it off / detox from it  In the past .  Most recent antidepressant trial is Zoloft- does not endorse side effects  Risk to Self: Is patient at  risk for suicide?: No Risk to Others:   Prior Inpatient Therapy:   Prior Outpatient Therapy:     Alcohol Screening: Patient refused Alcohol Screening Tool: Yes 1. How often do you have a drink containing alcohol?: Never 9. Have you or someone else been injured as a result of your drinking?: No 10. Has a relative or friend or a doctor or another health worker been concerned about your drinking or suggested you cut down?: No Alcohol Use Disorder Identification Test Final Score (AUDIT): 0 Brief Intervention: Patient declined brief intervention Substance Abuse History in the last 12 months:   Denies  Alcohol or drug abuse  Consequences of Substance Abuse: Denies  Previous Psychotropic Medications:  Was on Prozac for several years , as noted also on Xanax in the past. She also remembers being on Cymbalta in the past .  More recently has been on Zoloft .  She  Reports she has been on Ultram for pain and on Neurontin for foot pain following her fall, fracture, and stresses that with lesser doses of these medications her pain is worsening  Psychological Evaluations:  No  Past Medical History:  Past Medical History  Diagnosis Date  . GERD (gastroesophageal reflux disease)     hx pud '98  . Arthritis     djd  . Restless leg syndrome     takes Sinemet daily  . 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 Dysrhythmia     hx tachy palpitations  . Neuromuscular disorder ([place])     peripheral neuropathy, FEET AND LEGS  . Stroke [facility])     ? 6 months ago - tests okay - [facility]   . Cellulitis     10/11/11 hospitalized for cellulitis   . Hyperlipemia     takes Atorvastatin daily  . Peripheral neuropathy ([place])   . Elbow fracture, left April 2015  . Depression     takes Zoloft daily  . Vertigo     takes Meclizine daily as needed  . Insomnia     takes restoril nightly as needed  . Hypertension     takes HCTZ daily  . Peripheral neuropathy ([facility])     takes Gabapentin daily  . History of bronchitis     many yrs ago  . Headache(784.0)     migraines yrs ago-takes Imitrex daily  .  Joint pain   . Joint swelling   . Chronic back pain     scoliosis/stenosis  . Anxiety     takes Ativan daily as needed  . Cataracts, bilateral     immature   . Depression     Past Surgical History  Procedure Laterality Date  . Cervical disc surgery x2  2011  . Rt carotid enarterectomy  2011  . Rt knee arthroscopy  '99  . Left knee arthroscopy  2001  . Hematoma evacuation  2011    s/p rt cea  . Joint replacement  '01    total knee replacement, LEFT  . Abdominal hysterectomy  1975    bil oophorectomy  . Back surgery   MANY YRS AGO    laminectomy x3  . Cholecystectomy  1993  . Total knee arthroplasty  09/13/2011    Procedure: TOTAL KNEE ARTHROPLASTY;  Surgeon: Magnus Sinning, MD;  Location: [facility];  Service: Orthopedics;  Laterality: Right;  . Right knee replacement      09/2011   . Carpal tunnel release  07/09/2012  Procedure: CARPAL TUNNEL RELEASE;  Surgeon: Magnus Sinning, MD;  Location: [facility];  Service: Orthopedics;  Laterality: Left;  . Finger arthroplasty  07/09/2012    Procedure: FINGER ARTHROPLASTY;  Surgeon: Magnus Sinning, MD;  Location: [facility];  Service: Orthopedics;  Laterality: Left;  Interposition Arthroplasty [facility] Joint Thumb Left   . Lipoma excision  07/09/2012    Procedure: EXCISION LIPOMA;  Surgeon: Magnus Sinning, MD;  Location: [facility];  Service: Orthopedics;  Laterality: Left;  Excision Lipoma Dorsum Left Wrist   . Orif ankle fracture Right 09/14/2014    FIBULA   . Orif ankle fracture Right 09/14/2014    Procedure: OPEN REDUCTION INTERNAL FIXATION (ORIF) RIGHT ANKLE FRACTURE/SYNDESMOSIS ;  Surgeon: Wylene Simmer, MD;  Location: [facility];  Service: Orthopedics;  Laterality: Right;  . Colonosocpy    . Esophagogastroduodenoscopy    . Lumbar laminectomy with coflex 1 level Bilateral 04/19/2015    Procedure: LUMBAR TWO-THREE LUMBAR LAMINECTOMY WITH COFLEX ;  Surgeon: Kristeen Miss, MD;  Location: [facility];  Service: Neurosurgery;  Laterality: Bilateral;   Bilateral L23 laminectomy and foraminotomy with coflex   Family History:  Parents deceased . Family History  Problem Relation Age of Onset  . Congestive Heart Failure Father   . Congestive Heart Failure Sister   . Alzheimer's disease Mother   . Diabetes Brother   . 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 Denies legal issues .  History  Alcohol Use No     History  Drug Use No    Social History   Social History  . Marital Status: Widowed    Spouse Name: N/A  . Number of Children: 2  . Years of Education: 30   Social History Main Topics  . Smoking status: Never Smoker   . Smokeless tobacco: Never Used  . Alcohol Use: No  . Drug Use: No  . Sexual Activity: Yes    Birth Control/ Protection: Surgical   Other Topics Concern  . None   Social History Narrative   Patient is widowed and lives alone.   Patient has two sons.   Patient is retired.   Patient has a college education.   Patient is right-handed.   Patient drinks three glasses of Coke-soda daily.         Additional Social History:  rgies:   Allergies  Allergen Reactions  . Contrast Media [Iodinated Diagnostic Agents] Other (See Comments)    Respiratory failure  . Acyclovir And Related Other (See Comments)    unknown  . Sulfa Drugs Cross Reactors Nausea And Vomiting   Lab Results:  Results for orders placed or performed during the hospital encounter of 06/15/15 (from the past 48 hour(s))  Urine rapid drug screen (hosp performed)not at [facility]     Status: Abnormal   Collection Time: 06/16/15  1:57 PM  Result Value Ref Range   Opiates NONE DETECTED NONE DETECTED   Cocaine NONE DETECTED NONE DETECTED   Benzodiazepines  POSITIVE (A) NONE DETECTED   Amphetamines NONE DETECTED NONE DETECTED   Tetrahydrocannabinol NONE DETECTED NONE DETECTED   Barbiturates NONE DETECTED NONE DETECTED    Comment:        DRUG SCREEN FOR MEDICAL PURPOSES ONLY.  IF CONFIRMATION IS NEEDED FOR ANY PURPOSE, NOTIFY LAB WITHIN 5 DAYS.        LOWEST DETECTABLE LIMITS FOR URINE DRUG SCREEN Drug Class       Cutoff (ng/mL) Amphetamine      1000 Barbiturate      200 Benzodiazepine   [id number] Tricyclics       [id number] Opiates          300 Cocaine          300 THC              50   Urinalysis, Routine w reflex microscopic (not at [facility])     Status: Abnormal   Collection Time: 06/16/15  1:57 PM  Result Value Ref Range   Color, Urine YELLOW YELLOW   APPearance TURBID (A) CLEAR   Specific Gravity, Urine 1.018 1.005 - 1.030   pH 5.5 5.0 - 8.0   Glucose, UA NEGATIVE NEGATIVE mg/dL   Hgb urine dipstick NEGATIVE NEGATIVE   Bilirubin Urine NEGATIVE NEGATIVE   Ketones, ur NEGATIVE NEGATIVE mg/dL   Protein, ur NEGATIVE NEGATIVE mg/dL   Nitrite NEGATIVE NEGATIVE   Leukocytes, UA LARGE (A) NEGATIVE  Urine microscopic-add on     Status: Abnormal   Collection Time: 06/16/15  1:57 PM  Result Value Ref Range   Squamous Epithelial / LPF TOO NUMEROUS TO COUNT (A) NONE SEEN   WBC, UA TOO NUMEROUS TO COUNT 0 - 5 WBC/hpf   RBC / HPF 0-5 0 - 5 RBC/hpf   Bacteria, UA MANY (A) NONE SEEN   Casts HYALINE CASTS (A) NEGATIVE   Urine-Other MUCOUS PRESENT     Metabolic Disorder Labs:  Lab Results  Component Value Date   HGBA1C 6.6* 04/20/2015   MPG 143 04/20/2015   MPG 105 10/12/2011   No results found for: PROLACTIN Lab Results  Component Value Date   CHOL 222* 11/11/2014   TRIG 364* 11/11/2014   HDL 39* 11/11/2014   CHOLHDL 5.7* 11/11/2014   VLDL 73* 10/12/2011   LDLCALC 110* 11/11/2014   LDLCALC Comment 01/11/2014    Current Medications: Current Facility-Administered Medications  Medication Dose Route Frequency Provider Last Rate Last  Dose  . acetaminophen (TYLENOL) tablet 650 mg  650 mg Oral Q6H PRN Delfin Gant, NP   650 mg at 06/17/15 0702  . aspirin tablet 325 mg  325 mg Oral Daily Delfin Gant, NP   325 mg at 06/17/15 0903  . atorvastatin (LIPITOR) tablet 40 mg  40 mg Oral Daily Delfin Gant, NP   40 mg at 06/17/15 0904  . calcium carbonate (OS-CAL - dosed in mg of elemental calcium) tablet 500 mg of elemental calcium  1 tablet Oral Q breakfast Delfin Gant, NP   500 mg of elemental calcium at 06/17/15 0902  . feeding supplement (ENSURE ENLIVE) (ENSURE ENLIVE) liquid 237 mL  237 mL Oral Q24H Maricela Bo [place], RD   237 mL at 06/17/15 1000  . gabapentin (NEURONTIN) capsule 600 mg  600 mg Oral BID Delfin Gant, NP   600 mg at 06/17/15 0903  . lidocaine (LIDODERM) 5 % 1 patch  1 patch Transdermal Daily Delfin Gant, NP   1 patch at 06/17/15 0904  . LORazepam (ATIVAN) tablet 0.5 mg  0.5 mg Oral Q8H PRN Kerrie Buffalo, NP   0.5 mg at 06/17/15 1124  . meclizine (ANTIVERT) tablet 25 mg  25 mg Oral Daily PRN Delfin Gant, NP      .  pneumococcal 23 valent vaccine (PNU-IMMUNE) injection 0.5 mL  0.5 mL Intramuscular Tomorrow-1000 Kerrie Buffalo, NP      . pyridOXINE (VITAMIN B-6) tablet 50 mg  50 mg Oral Daily Delfin Gant, NP   50 mg at 06/17/15 0921  . sertraline (ZOLOFT) tablet 150 mg  150 mg Oral Daily Delfin Gant, NP   150 mg at 06/17/15 0903  . SUMAtriptan (IMITREX) tablet 50 mg  50 mg Oral Q2H PRN Delfin Gant, NP   50 mg at 06/17/15 1031  . temazepam (RESTORIL) capsule 7.5 mg  7.5 mg Oral QHS PRN Delfin Gant, NP   7.5 mg at 06/17/15 0140  . traMADol (ULTRAM) tablet 50 mg  50 mg Oral Q6H PRN Kerrie Buffalo, NP   50 mg at 06/17/15 [id number]   PTA Medications: Prescriptions prior to admission  Medication Sig Dispense Refill Last Dose  . acetaminophen (TYLENOL) 325 MG tablet Take 2 tablets (650 mg total) by mouth every 6 (six) hours as needed for mild pain (or Fever  >/= 101).     Marland Kitchen amoxicillin (AMOXIL) 500 MG capsule Take 2,000 mg by mouth See admin instructions. Must take 4 capsules 1 hour before dental procedures     . aspirin 325 MG tablet Take 325 mg by mouth daily.     Marland Kitchen atorvastatin (LIPITOR) 40 MG tablet Take one tablet by mouth once daily for cholesterol (Patient taking differently: Take 40 mg by mouth daily. ) 90 tablet 3   . calcium carbonate 1250 MG capsule Take 1,250 mg by mouth daily.      . cholecalciferol (VITAMIN D) 1000 UNITS tablet Take 1,000 Units by mouth daily.     . cloNIDine (CATAPRES) 0.1 MG tablet TAKE 1 TABLET BY MOUTH EVERY 12 HOURS AS NEEDED FOR SYSTOLIC BLOOD PRESSURE [id number] 30 tablet 5   . fluticasone (FLONASE) 50 MCG/ACT nasal spray SHAKE WELL AND USE 1 SPRAY IN EACH NOSTRIL DAILY AS NEEDED FOR ALLERGIES OR RHINITIS 48 g 1   . gabapentin (NEURONTIN) 600 MG tablet Take 1 tablet by mouth 4  times daily 360 tablet 0   . hydrochlorothiazide (HYDRODIURIL) 25 MG tablet Take 1 tablet by mouth in  the morning (Patient taking differently: Take 25 mg by mouth in  the morning) 90 tablet 1   . HYDROcodone-acetaminophen (NORCO/VICODIN) 5-325 MG tablet Take 1 tablet by mouth every 6 (six) hours as needed for moderate pain. Must last 28 days 60 tablet 0   . lisinopril (PRINIVIL,ZESTRIL) 20 MG tablet TAKE 1 TABLET BY MOUTH EVERY DAY 30 tablet 2   . LORazepam (ATIVAN) 1 MG tablet Take 1 tablet (1 mg total) by mouth every 8 (eight) hours as needed for anxiety. 90 tablet 2   . meclizine (ANTIVERT) 25 MG tablet Take one tablet by mouth once daily as needed for dizziness. (Patient taking differently: Take 25 mg by mouth daily as needed for dizziness. ) 30 tablet 0   . Phenylephrine HCl (AFRIN ALLERGY NA) Place 2 sprays into the nose daily as needed (Nasal congestion).     . potassium chloride SA (K-DUR,KLOR-CON) 20 MEQ tablet Take 2 tablets by mouth  daily for potassium (Patient taking differently: Take 1 tablet= 8meq  by mouth twice daily for potassium)  180 tablet 2   . pyridOXINE (VITAMIN B-6) 50 MG tablet Take 50 mg by mouth daily.     . sertraline (ZOLOFT) 50 MG tablet Take 100 mg by mouth daily.     Marland Kitchen  SUMAtriptan (IMITREX) 50 MG tablet See AUX Label (Patient taking differently: Take 50 mg by mouth as needed for migraine or headache) 30 tablet 2   . temazepam (RESTORIL) 7.5 MG capsule Take 1 capsule (7.5 mg total) by mouth at bedtime as needed for sleep. 90 capsule 1   . traMADol (ULTRAM) 50 MG tablet Take 50 mg by mouth every 6 (six) hours as needed. pain       Musculoskeletal: Strength & Muscle Tone: flaccid Gait & Station: gait not examined, patient in bed  Patient leans: Backward  Psychiatric Specialty Exam: Physical Exam  Review of Systems  Constitutional: Negative.   HENT: Negative.   Eyes: Negative.   Respiratory: Negative.   Cardiovascular: Negative.   Gastrointestinal: Negative.   Genitourinary: Negative.   Musculoskeletal: Positive for back pain and joint pain.  Skin: Negative.   Neurological: Negative.        Reports symptoms of peripheral neuropathy  Psychiatric/Behavioral: Positive for depression and suicidal ideas.  All other systems reviewed and are negative.   Blood pressure 134/95, pulse 85, temperature 98.8 F (37.1 C), temperature source Oral, resp. rate 20, height 5\' 3"  (1.6 m), weight 196 lb (88.905 kg), SpO2 100 %.Body mass index is 34.73 kg/(m^2).  General Appearance: Fairly Groomed  Engineer, water::  Good  Speech:  Normal Rate  Volume:  Normal  Mood:  Depressed and Irritable  Affect:  Constricted and irritable   Thought Process:  Linear  Orientation:  Other:  fully alert and attentive   Thought Content:  no hallucinations, no delusions   Suicidal Thoughts:  No at this time denies plan or intention of hurting self or of SI, contracts for safety on unit   Homicidal Thoughts:  No  Memory:  Recent and remote grossly intact   Judgement:  Fair  Insight:  Fair  Psychomotor Activity:  Decreased   Concentration:  Good  Recall:  Good  Fund of Knowledge:Good  Language: Good  Akathisia:  Negative  Handed:  Right  AIMS (if indicated):     Assets:  Desire for Improvement Resilience  ADL's:   Fair   Cognition: WNL  Sleep:  Number of Hours: 5.75     Treatment Plan Summary: Daily contact with patient to assess and evaluate symptoms and progress in treatment, Medication management, Plan inpatient admission and medications as below   Observation Level/Precautions:  15 minute checks  Laboratory:  as needed   Psychotherapy:  Supportive, milieu  Medications:  Continue ZOLOFT 150 mgrs QDAY for depression.  As  Per patient/  home medication list,   Will increase  NEURONTIN to 600 mgrs TID.  *As per Pharmacist recommendation will not increase further at this time due to creatinine clearance status.  Will increase ULTRAM to 100 mgrs Q 8 hours PRN severe pain. Patient aware of potential side effects , risk of serotonin syndrome but states she has had no side effects from this medication thus far . D/C Temazepam QHS PRN to  Minimize  potential sedation, fall risk   Consultations:  Will order PT consult to address possible use of walker for ambulation. Have requested Hospitalist Consult to help address medical issues such as increased Creatinine , low K+, possible UTI.   Discharge Concerns:  -  Estimated LOS: 6 days   Other:     I certify that inpatient services furnished can reasonably be expected to improve the patient's condition.   COBOS, FERNANDO 1/13/20172:45 PM

## 2015-06-17 NOTE — Consult Note (Addendum)
[facility] Medical Consultation  [name] [id number] DOB: February 06, 1941 DOA: 06/16/2015 PCP: Hollace Kinnier, DO   Requesting physician: Dr. Parke Poisson Date of consultation: 06/17/2015 Reason for consultation: multiple issues   Impression/Recommendations Active Problems:   MDD (major depressive disorder), recurrent episode, severe ([place]) - per primary team    Leukocytosis - possibly related to UTI - check UA and urine culture  - CBC In AM - place on empiric Doxy for now    Hypokalemia - supplement and repeat BMP In AM    Acute kidney injury - oral intake to be encouraged - if Cr continues trending up, may need to hold lisinopril  - BMP In AM   I will followup again tomorrow. Please contact me if I can be of assistance in the meanwhile. Thank you for this consultation.  Chief Complaint: pt with flat affect, no concerns   HPI:  Pt admitted to [facility] as per her son with FTT and progressive decline. TRH consulted due to multiple electrolyte issues and ? UTI management. Pt not providing any history, very depressed.   Review of Systems:  Unable to obtain, pt too depressed   Past Medical History  Diagnosis Date  . GERD (gastroesophageal reflux disease)     hx pud '98  . Arthritis     djd  . Restless leg syndrome     takes Sinemet daily  . 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 Dysrhythmia     hx tachy palpitations  . Neuromuscular disorder ([place])     peripheral neuropathy, FEET AND LEGS  . Stroke [facility])     ? 6 months ago - tests okay - [facility]   . Cellulitis     10/11/11 hospitalized for cellulitis   . Hyperlipemia     takes Atorvastatin daily  . Peripheral neuropathy ([place])   . Elbow fracture, left April 2015  . Depression     takes Zoloft daily  . Vertigo     takes Meclizine daily as needed  . Insomnia     takes restoril nightly as needed  . Hypertension     takes HCTZ daily  . Peripheral neuropathy ([facility])     takes Gabapentin daily  .  History of bronchitis     many yrs ago  . Headache(784.0)     migraines yrs ago-takes Imitrex daily  . Joint pain   . Joint swelling   . Chronic back pain     scoliosis/stenosis  . Anxiety     takes Ativan daily as needed  . Cataracts, bilateral     immature   . Depression    Past Surgical History  Procedure Laterality Date  . Cervical disc surgery x2  2011  . Rt carotid enarterectomy  2011  . Rt knee arthroscopy  '99  . Left knee arthroscopy  2001  . Hematoma evacuation  2011    s/p rt cea  . Joint replacement  '01    total knee replacement, LEFT  . Abdominal hysterectomy  1975    bil oophorectomy  . Back surgery   MANY YRS AGO    laminectomy x3  . Cholecystectomy  1993  . Total knee arthroplasty  09/13/2011    Procedure: TOTAL KNEE ARTHROPLASTY;  Surgeon: Magnus Sinning, MD;  Location: [facility];  Service: Orthopedics;  Laterality: Right;  . Right knee replacement      09/2011   . Carpal tunnel release  07/09/2012  Procedure: CARPAL TUNNEL RELEASE;  Surgeon: Magnus Sinning, MD;  Location: [facility];  Service: Orthopedics;  Laterality: Left;  . Finger arthroplasty  07/09/2012    Procedure: FINGER ARTHROPLASTY;  Surgeon: Magnus Sinning, MD;  Location: [facility];  Service: Orthopedics;  Laterality: Left;  Interposition Arthroplasty [facility] Joint Thumb Left   . Lipoma excision  07/09/2012    Procedure: EXCISION LIPOMA;  Surgeon: Magnus Sinning, MD;  Location: [facility];  Service: Orthopedics;  Laterality: Left;  Excision Lipoma Dorsum Left Wrist   . Orif ankle fracture Right 09/14/2014    FIBULA   . Orif ankle fracture Right 09/14/2014    Procedure: OPEN REDUCTION INTERNAL FIXATION (ORIF) RIGHT ANKLE FRACTURE/SYNDESMOSIS ;  Surgeon: Wylene Simmer, MD;  Location: [place];  Service: Orthopedics;  Laterality: Right;  . Colonosocpy    . Esophagogastroduodenoscopy    . Lumbar laminectomy with coflex 1 level Bilateral 04/19/2015    Procedure: LUMBAR TWO-THREE LUMBAR LAMINECTOMY WITH COFLEX ;   Surgeon: Kristeen Miss, MD;  Location: [place] [facility];  Service: Neurosurgery;  Laterality: Bilateral;  Bilateral L23 laminectomy and foraminotomy with coflex   Social History:  reports that she has never smoked. She has never used smokeless tobacco. She reports that she does not drink alcohol or use illicit drugs.  Allergies  Allergen Reactions  . Contrast Media [Iodinated Diagnostic Agents] Other (See Comments)    Respiratory failure  . Acyclovir And Related Other (See Comments)    unknown  . Sulfa Drugs Cross Reactors Nausea And Vomiting   Family History  Problem Relation Age of Onset  . Congestive Heart Failure Father   . Congestive Heart Failure Sister   . Alzheimer's disease Mother   . Diabetes Brother   . Arthritis Brother     Medication Sig  aspirin 325 MG tablet Take 325 mg by mouth daily.  atorvastatin (LIPITOR) 40 MG tablet Take one tablet by mouth once daily for cholesterol Patient taking differently: Take 40 mg by mouth daily.   cloNIDine (CATAPRES) 0.1 MG tablet TAKE 1 TABLET BY MOUTH EVERY 12 HOURS AS NEEDED FOR SYSTOLIC BLOOD PRESSURE [id number]  fluticasone (FLONASE) 50 MCG/ACT nasal spray SHAKE WELL AND USE 1 SPRAY IN EACH NOSTRIL DAILY AS NEEDED FOR ALLERGIES OR RHINITIS  gabapentin (NEURONTIN) 600 MG tablet Take 1 tablet by mouth 4  times daily  hydrochlorothiazide (HYDRODIURIL) 25 MG tablet Take 1 tablet by mouth in  the morning   HYDROcodone-acetaminophen (NORCO/VICODIN) 5-325 MG tablet Take 1 tablet by mouth every 6 (six) hours as needed for moderate pain. Must last 28 days  lisinopril (PRINIVIL,ZESTRIL) 20 MG tablet TAKE 1 TABLET BY MOUTH EVERY DAY  LORazepam (ATIVAN) 1 MG tablet Take 1 tablet (1 mg total) by mouth every 8 (eight) hours as needed for anxiety.  potassium chloride SA (K-DUR,KLOR-CON) 20 MEQ tablet Take 2 tablets by mouth  daily for potassium Patient taking differently: Take 1 tablet= 86meq  by mouth twice daily for potassium  pyridOXINE (VITAMIN B-6)  50 MG tablet Take 50 mg by mouth daily.  sertraline (ZOLOFT) 50 MG tablet Take 100 mg by mouth daily.  temazepam (RESTORIL) 7.5 MG capsule Take 1 capsule (7.5 mg total) by mouth at bedtime as needed for sleep.  traMADol (ULTRAM) 50 MG tablet Take 50 mg by mouth every 6 (six) hours as needed. pain   Physical Exam: Blood pressure 134/95, pulse 85, temperature 98.8 F (37.1 C), temperature source Oral, resp. rate 20, height 5\' 3"  (1.6 m), weight  88.905 kg (196 lb), SpO2 100 %. Filed Vitals:   06/16/15 1637  BP: 134/95  Pulse: 85  Temp: 98.8 F (37.1 C)  Resp: 20    Physical Exam  Constitutional: Appears depressed, flat affect  CVS: RRR, no gallops, no carotid bruit.   Declined rest of the exam  Labs on Admission:  Basic Metabolic Panel:  Recent Labs Lab 06/15/15 1331  NA 137  K 3.2*  CL 92*  CO2 26  GLUCOSE 166*  BUN 15  CREATININE 1.47*  CALCIUM 10.3   Liver Function Tests:  Recent Labs Lab 06/15/15 1331  AST 33  ALT <5*  ALKPHOS 107  BILITOT 0.8  PROT 9.0*  ALBUMIN 4.8   CBC:  Recent Labs Lab 06/15/15 1331  WBC 13.2*  NEUTROABS 9.1*  HGB 15.0  HCT 48.3*  MCV 97.4  PLT 414*   Radiological Exams on Admission: No results found.  EKG: not done  Time spent: 45 minutes   MAGICK-Ethon Wymer [facility] Pager [phone]  If 7PM-7AM, please contact night-coverage [email or web address] Password [email or web address] 06/17/2015, 9:12 PM

## 2015-06-17 NOTE — Tx Team (Signed)
Interdisciplinary Treatment Plan Update (Adult) Date: 06/17/2015   Date: 06/17/2015 8:45 AM  Progress in Treatment:  Attending groups: Pt is new to milieu, continuing to assess  Participating in groups: Pt is new to milieu, continuing to assess  Taking medication as prescribed: Yes  Tolerating medication: Yes  Family/Significant othe contact made: No, CSW assessing for appropriate contacts Patient understands diagnosis: Yes AEB seeking help with depression Discussing patient identified problems/goals with staff: Yes  Medical problems stabilized or resolved: Yes  Denies suicidal/homicidal ideation: Yes Patient has not harmed self or Others: Yes   New problem(s) identified: None identified at this time.   Discharge Plan or Barriers: Pt will return home and follow-up with outpatient resources  Additional comments:  Patient and CSW reviewed pt's identified goals and treatment plan. Patient verbalized understanding and agreed to treatment plan. CSW reviewed [facility] "Discharge Process and Patient Involvement" Form. Pt verbalized understanding of information provided and signed form.   Reason for Continuation of Hospitalization:  Depression Medication stabilization  Estimated length of stay: 3-5 days  Review of initial/current patient goals per problem list:   1.  Goal(s): Patient will participate in aftercare plan  Met:  Yes  Target date: 3-5 days from date of admission   As evidenced by: Patient will participate within aftercare plan AEB aftercare provider and housing plan at discharge being identified.   06/17/15: Pt will return home and follow-up with outpatient resources  2.  Goal (s): Patient will exhibit decreased depressive symptoms and suicidal ideations.  Met:  No  Target date: 3-5 days from date of admission   As evidenced by: Patient will utilize self rating of depression at 3 or below and demonstrate decreased signs of depression or be deemed stable for discharge by  MD. 06/17/15: Pt was admitted with symptoms of depression, rating 10/10. Pt continues to present with flat affect and depressive symptoms.  Pt will demonstrate decreased symptoms of depression and rate depression at 3/10 or lower prior to discharge.  Attendees:  Patient:    Family:    Physician: Dr. Parke Poisson, MD  06/17/2015 8:45 AM  Nursing: Lars Pinks, RN Case manager  06/17/2015 8:45 AM  Clinical Social Worker Peri Maris, [place] 06/17/2015 8:45 AM  Other: Tilden Fossa, LCSWA 06/17/2015 8:45 AM  Clinical:  Lise Auer, RN 06/17/2015 8:45 AM  Other: , RN Charge Nurse 06/17/2015 8:45 AM  Other: Hilda Lias, [place], [place] Social Work [phone]

## 2015-06-17 NOTE — Progress Notes (Signed)
Patient ID: [name], female   DOB: March 21, 1941, 75 y.o.   MRN: [id number] D: Client in bed most of this shift, but has been up on the phone. Client reports decreased anxiety with recent administration of Ativan on previous shift.(see MAR). Client also reports chronic back pain. Writer provided emotional support, heat pack given, as scheduled medication not due at this time. Client will be monitored q32min for safety. R: Client is safe on the unit, did not attend group.

## 2015-06-17 NOTE — BHH Counselor (Signed)
CSW attempted to meet with Pt to complete PSA, however Pt was unwilling to participate as she is focused on receiving her Neurontin. She did disclose that she sees Oneida Arenas at [facility] for therapy.  Peri Maris, [place] Work [phone]

## 2015-06-17 NOTE — BHH Suicide Risk Assessment (Signed)
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 MDD (major depressive disorder), recurrent episode, severe ([place]) [F33.2] 06/16/2015  . Lumbar stenosis [M48.06] 04/19/2015  . Hypokalemia [E87.6] 09/19/2014  . Constipation [K59.00] 09/19/2014  . Acute encephalopathy [G93.40] 09/18/2014  . Acute respiratory failure ([place]) [J96.00] 09/17/2014  . OSA (obstructive sleep apnea) [G47.33] 09/17/2014  . Ankle fracture, lateral malleolus, closed [S82.63XA] 09/14/2014  . Major depressive disorder, recurrent severe without psychotic features ([place]) [F33.2] 08/07/2014  . Insomnia [G47.00] 08/05/2014  . Hip joint painful on movement [M25.559] 08/05/2014  . Degenerative disc disease, lumbar [M51.36] 08/05/2014  . Severe obesity (BMI >= 40) ([place]) [E66.01] 01/11/2014  . Hyperlipemia [E78.5]   . Depression [F32.9]   . Arthritis [M19.90]   . Polyneuropathy in other diseases classified elsewhere ([place]) [G63] 04/08/2013  . Restless legs syndrome (RLS) [G25.81] 04/08/2013  . UTI (lower urinary tract infection) [N39.0] 10/14/2011  . Dizziness [R42] 10/12/2011  . Fall [W19.XXXA] 10/12/2011  . TIA (transient ischemic attack) [G45.9] 10/12/2011  . Cellulitis [L03.90] 10/12/2011  . HTN (hypertension) [I10] 10/12/2011  . GERD (gastroesophageal reflux disease) [K21.9] 10/12/2011     Continued Clinical Symptoms:  Alcohol Use Disorder Identification Test Final Score (AUDIT): 0 The "Alcohol Use Disorders Identification Test",  Guidelines for Use in Primary Care, Second Edition.  World Pharmacologist [facility]). Score between 0-7:  no or low risk or alcohol related problems. Score between 8-15:  moderate risk of alcohol related problems. Score between 16-19:  high risk of alcohol related problems. Score 20 or above:  warrants further diagnostic evaluation for alcohol dependence and treatment.   CLINICAL FACTORS:  75 year old female, history of depression, presents to ED due to worsening depression, social isolation, worsening ADLs . Reports chronic pain as a major stressor .    Psychiatric Specialty Exam: Physical Exam  ROS  Blood pressure 134/95, pulse 85, temperature 98.8 F (37.1 C), temperature source Oral, resp. rate 20, height 5\' 3"  (1.6 m), weight 196 lb (88.905 kg), SpO2 100 %.Body mass index is 34.73 kg/(m^2).   see admit note MSE                                                        COGNITIVE FEATURES THAT CONTRIBUTE TO RISK:  Loss of executive function    SUICIDE RISK:   Moderate:  Frequent suicidal ideation with limited intensity, and duration, some specificity in terms of plans, no associated intent, good self-control, limited dysphoria/symptomatology, some risk factors present, and identifiable protective factors, including available and accessible social support.  PLAN OF CARE: Patient will be admitted to inpatient psychiatric unit for stabilization and safety. Will provide and encourage milieu participation. Provide medication management and maked adjustments as needed.  Will follow daily.    Medical Decision Making:  Review of Psycho-Social Stressors (1), Review  or order clinical lab tests (1), Established Problem, Worsening (2) and Review of New Medication or Change in Dosage (2)  I certify that inpatient services furnished can reasonably be expected to improve the patient's condition.   COBOS, FERNANDO 06/17/2015, 2:44 PM

## 2015-06-17 NOTE — BHH Group Notes (Signed)
[facility] LCSW Aftercare Discharge Planning Group Note  06/17/2015 8:45 AM  Pt did not attend, declined invitation.   Peri Maris, [place] 06/17/2015 4:35 PM

## 2015-06-17 NOTE — Progress Notes (Signed)
NSG shift assessment. 7a-7p.   D: Pt has stayed in bed all day because the pain in her back and feet has not been controlled due to disruption in round-the-clock pain medication on admission to the ED and then [facility].  She rates her depression as 9, feelings of hopelessness 7, and anxiety is 10 out 10.  She does not feel that she can come to groups until her pain is under control, so she has stayed in bed. A wheel chair is by the bedside, but she wants her walker from home.  There was not a walker in working condition on the unit at this time and she said that her son is going to bring hers today. She denies SI at this time, but did feel like killing herself last night. [name] tearful because of her pain, and rocks back and forth and moves her feet constantly. She said that rocking and moving his her way of trying to cope with her pain.   A: Support and encouragement offered. Safety maintained with observations every 15 minutes.   R:   Contracts for safety.

## 2015-06-18 LAB — BASIC METABOLIC PANEL
Anion gap: 14 (ref 5–15)
BUN: 28 mg/dL — AB (ref 6–20)
CHLORIDE: 95 mmol/L — AB (ref 101–111)
CO2: 28 mmol/L (ref 22–32)
CREATININE: 1.29 mg/dL — AB (ref 0.44–1.00)
Calcium: 9.6 mg/dL (ref 8.9–10.3)
GFR calc Af Amer: 46 mL/min — ABNORMAL LOW (ref >60)
GFR calc non Af Amer: 40 mL/min — ABNORMAL LOW (ref >60)
GLUCOSE: 107 mg/dL — AB (ref 65–99)
Potassium: 3.7 mmol/L (ref 3.5–5.1)
Sodium: 137 mmol/L (ref 135–145)

## 2015-06-18 LAB — LIPID PANEL
CHOL/HDL RATIO: 5.5 ratio
Cholesterol: 218 mg/dL — ABNORMAL HIGH (ref 0–200)
HDL: 40 mg/dL — AB (ref >40)
LDL Cholesterol: 137 mg/dL — ABNORMAL HIGH (ref 0–99)
TRIGLYCERIDES: 206 mg/dL — AB (ref <150)
VLDL: 41 mg/dL — ABNORMAL HIGH (ref 0–40)

## 2015-06-18 LAB — CBC
HEMATOCRIT: 40.5 % (ref 36.0–46.0)
HEMOGLOBIN: 13 g/dL (ref 12.0–15.0)
MCH: 30.1 pg (ref 26.0–34.0)
MCHC: 32.1 g/dL (ref 30.0–36.0)
MCV: 93.8 fL (ref 78.0–100.0)
Platelets: 278 10*3/uL (ref 150–400)
RBC: 4.32 MIL/uL (ref 3.87–5.11)
RDW: 13.5 % (ref 11.5–15.5)
WBC: 8.6 10*3/uL (ref 4.0–10.5)

## 2015-06-18 LAB — URINALYSIS, ROUTINE W REFLEX MICROSCOPIC
BILIRUBIN URINE: NEGATIVE
Glucose, UA: NEGATIVE mg/dL
HGB URINE DIPSTICK: NEGATIVE
KETONES UR: NEGATIVE mg/dL
NITRITE: NEGATIVE
PH: 5.5 (ref 5.0–8.0)
Protein, ur: NEGATIVE mg/dL
SPECIFIC GRAVITY, URINE: 1.016 (ref 1.005–1.030)

## 2015-06-18 LAB — RAPID URINE DRUG SCREEN, HOSP PERFORMED
Amphetamines: NOT DETECTED
BARBITURATES: NOT DETECTED
Benzodiazepines: POSITIVE — AB
Cocaine: NOT DETECTED
OPIATES: NOT DETECTED
TETRAHYDROCANNABINOL: NOT DETECTED

## 2015-06-18 LAB — URINE MICROSCOPIC-ADD ON

## 2015-06-18 LAB — MAGNESIUM: Magnesium: 2 mg/dL (ref 1.7–2.4)

## 2015-06-18 MED ORDER — GABAPENTIN 600 MG PO TABS
600.0000 mg | ORAL_TABLET | Freq: Once | ORAL | Status: AC
  Administered 2015-06-18: 600 mg via ORAL
  Filled 2015-06-18 (×2): qty 1

## 2015-06-18 NOTE — Progress Notes (Signed)
[facility] MD Progress Note  06/18/2015 1:30 PM [name]  MRN:  [id number]   Subjective: Patient reports " I am not feeling myself today, I have chronic leg and back pain"  Objective: [name] is awake, alert and oriented X3 , found resting in bedroom.  Denies suicidal or homicidal ideation. Denies auditory or visual hallucination and does not appear to be responding to internal stimuli. Patient reports interacting well with staff and others. Patient reports she is medication compliant without mediation side effects. Patient is asking for all medications to be increased or she would like to be discharged. Denies  learning new coping skills or attending group session due to pain in lower back and legs. States her depression 8/10. Patient states "I am not feeling myself today, I have chronic leg and back pain and my mediations are not given to me on time here."  Reports good appetite and resting okay. Support, encouragement and reassurance was provided.   Principal Problem: MDD (major depressive disorder), recurrent episode, severe ([place]) Diagnosis:   Patient Active Problem List   Diagnosis Date Noted  . MDD (major depressive disorder), recurrent episode, severe ([place]) [F33.2] 06/16/2015  . Lumbar stenosis [M48.06] 04/19/2015  . Hypokalemia [E87.6] 09/19/2014  . Constipation [K59.00] 09/19/2014  . Acute encephalopathy [G93.40] 09/18/2014  . Acute respiratory failure ([place]) [J96.00] 09/17/2014  . OSA (obstructive sleep apnea) [G47.33] 09/17/2014  . Ankle fracture, lateral malleolus, closed [S82.63XA] 09/14/2014  . Major depressive disorder, recurrent severe without psychotic features ([place]) [F33.2] 08/07/2014  . Insomnia [G47.00] 08/05/2014  . Hip joint painful on movement [M25.559] 08/05/2014  . Degenerative disc disease, lumbar [M51.36] 08/05/2014  . Severe obesity (BMI >= 40) ([place]) [E66.01] 01/11/2014  . Hyperlipemia [E78.5]   . Depression [F32.9]   . Arthritis [M19.90]   .  Polyneuropathy in other diseases classified elsewhere ([place]) [G63] 04/08/2013  . Restless legs syndrome (RLS) [G25.81] 04/08/2013  . UTI (lower urinary tract infection) [N39.0] 10/14/2011  . Dizziness [R42] 10/12/2011  . Fall [W19.XXXA] 10/12/2011  . TIA (transient ischemic attack) [G45.9] 10/12/2011  . Cellulitis [L03.90] 10/12/2011  . HTN (hypertension) [I10] 10/12/2011  . GERD (gastroesophageal reflux disease) [K21.9] 10/12/2011   Total Time spent with patient: 30 minutes  Past Psychiatric History: SEE ABOVE   Past Medical History:  Past Medical History  Diagnosis Date  . GERD (gastroesophageal reflux disease)     hx pud '98  . Arthritis     djd  . Restless leg syndrome     takes Sinemet daily  . 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 Dysrhythmia     hx tachy palpitations  . Neuromuscular disorder ([place])     peripheral neuropathy, FEET AND LEGS  . Stroke [facility])     ? 6 months ago - tests okay - [facility]   . Cellulitis     10/11/11 hospitalized for cellulitis   . Hyperlipemia     takes Atorvastatin daily  . Peripheral neuropathy ([place])   . Elbow fracture, left April 2015  . Depression     takes Zoloft daily  . Vertigo     takes Meclizine daily as needed  . Insomnia     takes restoril nightly as needed  . Hypertension     takes HCTZ daily  . Peripheral neuropathy (H[facility])     takes Gabapentin daily  . History of bronchitis     many yrs ago  . Headache(784.0)  migraines yrs ago-takes Imitrex daily  . Joint pain   . Joint swelling   . Chronic back pain     scoliosis/stenosis  . Anxiety     takes Ativan daily as needed  . Cataracts, bilateral     immature   . Depression     Past Surgical History  Procedure Laterality Date  . Cervical disc surgery x2  2011  . Rt carotid enarterectomy  2011  . Rt knee arthroscopy  '99  . Left knee arthroscopy  2001  . Hematoma evacuation  2011    s/p rt cea  . Joint replacement  '01    total knee replacement, LEFT   . Abdominal hysterectomy  1975    bil oophorectomy  . Back surgery   MANY YRS AGO    laminectomy x3  . Cholecystectomy  1993  . Total knee arthroplasty  09/13/2011    Procedure: TOTAL KNEE ARTHROPLASTY;  Surgeon: Magnus Sinning, MD;  Location: [facility];  Service: Orthopedics;  Laterality: Right;  . Right knee replacement      09/2011   . Carpal tunnel release  07/09/2012    Procedure: CARPAL TUNNEL RELEASE;  Surgeon: Magnus Sinning, MD;  Location: [facility];  Service: Orthopedics;  Laterality: Left;  . Finger arthroplasty  07/09/2012    Procedure: FINGER ARTHROPLASTY;  Surgeon: Magnus Sinning, MD;  Location: [facility];  Service: Orthopedics;  Laterality: Left;  Interposition Arthroplasty [facility] Joint Thumb Left   . Lipoma excision  07/09/2012    Procedure: EXCISION LIPOMA;  Surgeon: Magnus Sinning, MD;  Location: [facility];  Service: Orthopedics;  Laterality: Left;  Excision Lipoma Dorsum Left Wrist   . Orif ankle fracture Right 09/14/2014    FIBULA   . Orif ankle fracture Right 09/14/2014    Procedure: OPEN REDUCTION INTERNAL FIXATION (ORIF) RIGHT ANKLE FRACTURE/SYNDESMOSIS ;  Surgeon: Wylene Simmer, MD;  Location: [place];  Service: Orthopedics;  Laterality: Right;  . Colonosocpy    . Esophagogastroduodenoscopy    . Lumbar laminectomy with coflex 1 level Bilateral 04/19/2015    Procedure: LUMBAR TWO-THREE LUMBAR LAMINECTOMY WITH COFLEX ;  Surgeon: Kristeen Miss, MD;  Location: [place] [facility];  Service: Neurosurgery;  Laterality: Bilateral;  Bilateral L23 laminectomy and foraminotomy with coflex   Family History:  Family History  Problem Relation Age of Onset  . Congestive Heart Failure Father   . Congestive Heart Failure Sister   . Alzheimer's disease Mother   . Diabetes Brother   . Arthritis Brother    Family Psychiatric  History: SEE ABOVE Social History:  History  Alcohol Use No     History  Drug Use No    Social History   Social History  . Marital Status: Widowed    Spouse Name:  N/A  . Number of Children: 2  . Years of Education: 9   Social History Main Topics  . Smoking status: Never Smoker   . Smokeless tobacco: Never Used  . Alcohol Use: No  . Drug Use: No  . Sexual Activity: Yes    Birth Control/ Protection: Surgical   Other Topics Concern  . None   Social History Narrative   Patient is widowed and lives alone.   Patient has two sons.   Patient is retired.   Patient has a college education.   Patient is right-handed.   Patient drinks three glasses of Coke-soda daily.         Additional Social History:  Sleep: Fair  Appetite:  Good  Current Medications: Current Facility-Administered Medications  Medication Dose Route Frequency Provider Last Rate Last Dose  . acetaminophen (TYLENOL) tablet 650 mg  650 mg Oral Q6H PRN Delfin Gant, NP   650 mg at 06/17/15 2259  . aspirin tablet 325 mg  325 mg Oral Daily Delfin Gant, NP   325 mg at 06/18/15 0911  . atorvastatin (LIPITOR) tablet 40 mg  40 mg Oral Daily Delfin Gant, NP   40 mg at 06/18/15 0913  . calcium carbonate (OS-CAL - dosed in mg of elemental calcium) tablet 500 mg of elemental calcium  1 tablet Oral Q breakfast Delfin Gant, NP   500 mg of elemental calcium at 06/18/15 0913  . doxycycline (VIBRA-TABS) tablet 100 mg  100 mg Oral Q12H Theodis Blaze, MD   100 mg at 06/18/15 0800  . feeding supplement (ENSURE ENLIVE) (ENSURE ENLIVE) liquid 237 mL  237 mL Oral Q24H Maricela Bo Ost[place], RD   237 mL at 06/17/15 1000  . gabapentin (NEURONTIN) capsule 600 mg  600 mg Oral TID Jenne Campus, MD   600 mg at 06/18/15 0912  . lidocaine (LIDODERM) 5 % 1 patch  1 patch Transdermal Daily Delfin Gant, NP   1 patch at 06/18/15 0800  . LORazepam (ATIVAN) tablet 0.5 mg  0.5 mg Oral Q8H PRN Kerrie Buffalo, NP   0.5 mg at 06/18/15 0547  . meclizine (ANTIVERT) tablet 25 mg  25 mg Oral Daily PRN Delfin Gant, NP      . pneumococcal 23 valent  vaccine (PNU-IMMUNE) injection 0.5 mL  0.5 mL Intramuscular Tomorrow-1000 Kerrie Buffalo, NP      . potassium chloride (K-DUR,KLOR-CON) CR tablet 10 mEq  10 mEq Oral Daily Theodis Blaze, MD   10 mEq at 06/18/15 0914  . pyridOXINE (VITAMIN B-6) tablet 50 mg  50 mg Oral Daily Delfin Gant, NP   50 mg at 06/18/15 0912  . sertraline (ZOLOFT) tablet 150 mg  150 mg Oral Daily Delfin Gant, NP   150 mg at 06/18/15 0800  . SUMAtriptan (IMITREX) tablet 50 mg  50 mg Oral Q2H PRN Delfin Gant, NP   50 mg at 06/18/15 0237  . traMADol (ULTRAM) tablet 100 mg  100 mg Oral Q8H PRN Jenne Campus, MD   100 mg at 06/18/15 6144    Lab Results:  Results for orders placed or performed during the hospital encounter of 06/16/15 (from the past 48 hour(s))  Urinalysis, Routine w reflex microscopic (not at [facility])     Status: Abnormal   Collection Time: 06/17/15  2:17 PM  Result Value Ref Range   Color, Urine YELLOW YELLOW   APPearance CLOUDY (A) CLEAR   Specific Gravity, Urine 1.016 1.005 - 1.030   pH 5.5 5.0 - 8.0   Glucose, UA NEGATIVE NEGATIVE mg/dL   Hgb urine dipstick NEGATIVE NEGATIVE   Bilirubin Urine NEGATIVE NEGATIVE   Ketones, ur NEGATIVE NEGATIVE mg/dL   Protein, ur NEGATIVE NEGATIVE mg/dL   Nitrite NEGATIVE NEGATIVE   Leukocytes, UA MODERATE (A) NEGATIVE    Comment: Performed at [facility]  Urine rapid drug screen (hosp performed)not at [facility]     Status: Abnormal   Collection Time: 06/17/15  2:17 PM  Result Value Ref Range   Opiates NONE DETECTED NONE DETECTED   Cocaine NONE DETECTED NONE DETECTED   Benzodiazepines POSITIVE (A) NONE DETECTED   Amphetamines  NONE DETECTED NONE DETECTED   Tetrahydrocannabinol NONE DETECTED NONE DETECTED   Barbiturates NONE DETECTED NONE DETECTED    Comment:        DRUG SCREEN FOR MEDICAL PURPOSES ONLY.  IF CONFIRMATION IS NEEDED FOR ANY PURPOSE, NOTIFY LAB WITHIN 5 DAYS.        LOWEST DETECTABLE LIMITS FOR URINE DRUG  SCREEN Drug Class       Cutoff (ng/mL) Amphetamine      1000 Barbiturate      200 Benzodiazepine   419 Tricyclics       622 Opiates          300 Cocaine          300 THC              50 Performed at [facility]   Urine microscopic-add on     Status: Abnormal   Collection Time: 06/17/15  2:17 PM  Result Value Ref Range   Squamous Epithelial / LPF 0-5 (A) NONE SEEN   WBC, UA 6-30 0 - 5 WBC/hpf   RBC / HPF 0-5 0 - 5 RBC/hpf   Bacteria, UA FEW (A) NONE SEEN    Comment: Performed at [facility]  Lipid panel     Status: Abnormal   Collection Time: 06/18/15  7:26 AM  Result Value Ref Range   Cholesterol 218 (H) 0 - 200 mg/dL   Triglycerides 206 (H) <150 mg/dL   HDL 40 (L) >40 mg/dL   Total CHOL/HDL Ratio 5.5 RATIO   VLDL 41 (H) 0 - 40 mg/dL   LDL Cholesterol 137 (H) 0 - 99 mg/dL    Comment:        Total Cholesterol/HDL:CHD Risk Coronary Heart Disease Risk Table                     Men   Women  1/2 Average Risk   3.4   3.3  Average Risk       5.0   4.4  2 X Average Risk   9.6   7.1  3 X Average Risk  23.4   11.0        Use the calculated Patient Ratio above and the CHD Risk Table to determine the patient's CHD Risk.        ATP III CLASSIFICATION (LDL):  <100     mg/dL   Optimal  100-129  mg/dL   Near or Above                    Optimal  130-159  mg/dL   Borderline  160-189  mg/dL   High  >190     mg/dL   Very High Performed at [facility]   Magnesium     Status: None   Collection Time: 06/18/15  7:26 AM  Result Value Ref Range   Magnesium 2.0 1.7 - 2.4 mg/dL    Comment: Performed at [facility]  CBC     Status: None   Collection Time: 06/18/15  7:26 AM  Result Value Ref Range   WBC 8.6 4.0 - 10.5 K/uL   RBC 4.32 3.87 - 5.11 MIL/uL   Hemoglobin 13.0 12.0 - 15.0 g/dL   HCT 40.5 36.0 - 46.0 %   MCV 93.8 78.0 - 100.0 fL   MCH 30.1 26.0 - 34.0 pg   MCHC 32.1 30.0 - 36.0 g/dL   RDW 13.5 11.5 - 15.5 %  Platelets 278 150 - 400 K/uL    Comment: Performed at [facility] metabolic panel     Status: Abnormal   Collection Time: 06/18/15  7:26 AM  Result Value Ref Range   Sodium 137 135 - 145 mmol/L   Potassium 3.7 3.5 - 5.1 mmol/L   Chloride 95 (L) 101 - 111 mmol/L   CO2 28 22 - 32 mmol/L   Glucose, Bld 107 (H) 65 - 99 mg/dL   BUN 28 (H) 6 - 20 mg/dL   Creatinine, Ser 1.29 (H) 0.44 - 1.00 mg/dL   Calcium 9.6 8.9 - 10.3 mg/dL   GFR calc non Af Amer 40 (L) >60 mL/min   GFR calc Af Amer 46 (L) >60 mL/min    Comment: (NOTE) The eGFR has been calculated using the CKD EPI equation. This calculation has not been validated in all clinical situations. eGFR's persistently <60 mL/min signify possible Chronic Kidney Disease.    Anion gap 14 5 - 15    Comment: Performed at [facility]    Physical Findings: AIMS: Facial and Oral Movements Muscles of Facial Expression: None, normal Lips and Perioral Area: None, normal Jaw: None, normal Tongue: None, normal,Extremity Movements Upper (arms, wrists, hands, fingers): None, normal Lower (legs, knees, ankles, toes): None, normal, Trunk Movements Neck, shoulders, hips: None, normal, Overall Severity Severity of abnormal movements (highest score from questions above): None, normal Incapacitation due to abnormal movements: None, normal Patient's awareness of abnormal movements (rate only patient's report): No Awareness, Dental Status Current problems with teeth and/or dentures?: No Does patient usually wear dentures?: No  CIWA:    COWS:     Musculoskeletal: Strength & Muscle Tone: within normal limits Gait & Station: unsteady patient use walker for assistance Patient leans: N/A  Psychiatric Specialty Exam: Review of Systems  Constitutional: Negative.   HENT: Negative.   Eyes: Negative.   Musculoskeletal: Positive for back pain.       Leg pain 10/10  Skin: Negative.   Neurological: Positive  for tingling.  Psychiatric/Behavioral: Positive for depression. The patient is nervous/anxious and has insomnia.   All other systems reviewed and are negative.   Blood pressure 117/63, pulse 94, temperature 98.2 F (36.8 C), temperature source Oral, resp. rate 16, height 5' 3"  (1.6 m), weight 88.905 kg (196 lb), SpO2 100 %.Body mass index is 34.73 kg/(m^2).  General Appearance: Disheveled and Guarded  Eye Contact::  Minimal  Speech:  Clear and Coherent  Volume:  Normal fluctuation   Mood:  Anxious, Depressed and Irritable  Affect:  Labile aggressive  Thought Process:  Coherent and Logical  Orientation:  Full (Time, Place, and Person)  Thought Content:  Rumination with pain and medications  Suicidal Thoughts:  No  Homicidal Thoughts:  No  Memory:  Immediate;   Fair Recent;   Fair Remote;   Fair  Judgement:  Poor  Insight:  Lacking  Psychomotor Activity:  Restlessness  Concentration:  Fair  Recall:  Poor  Fund of Knowledge:Poor  Language: Poor  Akathisia:  No  Handed:  Right  AIMS (if indicated):     Assets:  Communication Skills Desire for Improvement Social Support  ADL's:  Intact  Cognition: WNL  Sleep:  Number of Hours: 1.75     I agree with current treatment plan on 01/14//2016, Patient seen face-to-face for psychiatric evaluation follow-up, chart reviewed and consult reviewed. Labs Orders: BMP for 06/18/2014 and UA/ pending results.  Reviewed the information documented and  agree with the treatment plan.  Treatment Plan Summary:  Daily contact with patient to assess and evaluate symptoms and progress in treatment and Medication management Continue ZOLOFT 150 mgrs QDAY for depression.  As Per patient/ home medication list,  Continue NEURONTIN to 600 mgrs TID.  *As per Pharmacist recommendation will not increase further at this time due to creatinine clearance status.  Will increase ULTRAM to 100 mgrs Q 8 hours PRN severe pain. Patient aware of potential side  effects , risk of serotonin syndrome but states she has had no side effects from this medication thus far . D/C Temazepam QHS PRN to Minimize potential sedation, fall risk   Consultations: Will order PT consult to address possible use of walker for ambulation. Have requested Hospitalist Consult to help address medical issues such as increased Creatinine , low K+, possible UTI.   PER Consult: Leukocytosis - possibly related to UTI - check UA and urine culture - Pending results - CBC In AM-resulted WNL - place on empiric Doxy for now -Started  Hypokalemia: - supplement and repeat BMP In AM  Acute kidney injury: - oral intake to be encouraged - if Cr continues trending up, may need to hold lisinopril  - BMP In AM/ Per consult. Labs on 06/18/2014   [place] 06/18/2015, 1:30 PM

## 2015-06-18 NOTE — Progress Notes (Signed)
Patient ID: [name], female   DOB: 03/06/1941, 75 y.o.   MRN: [id number] D: Client tearful as her son's left from visiting this evening. Client eventually because labile and aggressive, saying she was being punished and medications were not being given to her as scheduled. "Y'all trying to make me do more than I can do" "trying to punish the old nurse" Client also insist Gabapentin was to be given at bedtime and insinuated writer must have read the order wrong "I don't understand how one person could read something one way and then another read it another way" Client also was harsh in speaking to writer and it was reported by other staff members she was verbally abusive as well. She was insistant that her pain medications could be given an hour before scheduled time. Client told Probation officer in no uncertain terms pointing at the writer "[place], IF YOU CAN'T THEN GIVE ME THE NUMBER AND MY SON'S WILL CALL HIM"  Client came down the hall in wheel chair and made an attempt to call son's but phone were off for the night. A: Writer explained to client upon first contact medications would be reviewed and I would return to give as scheduled, Tylenol (see MAR) was given for break through pain initially. Client then began to complain that Gabapentin was ordered for four times a day and she didn't understand why it wasn't given. After reviewing orders writer spoke with client to let her know Gabapentin was not ordered at this time, but I would check with NP to see if an extra dose could be given. Client came to nurses station in [facility] demanding to use phone to call her son's. "I didn't know I would be treated like this" "they gave the order earlier today" Staff will monitor q55min for safety. R: Client is safe on the unit. Writer assured client that NP will address and return call. Client became irate and demand the physician number. Received order for Gabapentin, administered to client. Client was liable  during this shift demanding medications and immediate attention, threatening staff further to report to administration and physician.

## 2015-06-18 NOTE — [facility] Counselor (Signed)
Adult Comprehensive Assessment  Patient ID: [name], female   DOB: [birth year], 75 y.o.   MRN: [id number]  Information Source: Information source: Patient  Current Stressors:  Educational / Learning stressors: NA Employment / Job issues: Retired Family Relationships: Pt only wishes she could see sons more frequently Museum/gallery curator / Lack of resources (include bankruptcy): NA Housing / Lack of housing: NA Physical health (include injuries & life threatening diseases): Multiple health issues; pt reports 8 bone related surgeries in last 5 years Social relationships: Has supports yet isolating due to health concerns Substance abuse: NA Bereavement / Loss: Sister 2013; husband 2011  Living/Environment/Situation:  Living Arrangements: Alone Living conditions (as described by patient or guardian): Pt has town home in which she feels safe How long has patient lived in current situation?: 10 years What is atmosphere in current home: Comfortable, Other (Comment) (Pt reports both she and sons feel like she is lonely and dealing with "empty nest")  Family History:  Marital status: Widowed Widowed, when?: 2011 Are you sexually active?: No What is your sexual orientation?: Heterosexual Has your sexual activity been affected by drugs, alcohol, medication, or emotional stress?: Yes and also by spouse's death Does patient have children?: Yes How many children?: 2 How is patient's relationship with their children?: Good with two adult son's  Childhood History:  By whom was/is the patient raised?: Both parents Additional childhood history information: na Description of patient's relationship with caregiver when they were a child: Good Patient's description of current relationship with people who raised him/her: Both deceased How were you disciplined when you got in trouble as a child/adolescent?: Loss of privileges; seldom disciplined physically  Does patient have siblings?: Yes Number of  Siblings: 3 Description of patient's current relationship with siblings: 1 sister deceased; two brothers who are living in [place], good relationship with both Did patient suffer any verbal/emotional/physical/sexual abuse as a child?: No Did patient suffer from severe childhood neglect?: No Has patient ever been sexually abused/assaulted/raped as an adolescent or adult?: No Was the patient ever a victim of a crime or a disaster?: No Witnessed domestic violence?: No Has patient been effected by domestic violence as an adult?: No  Education:  Highest grade of school patient has completed: 81 Currently a Ship broker?: No Learning disability?: No  Employment/Work Situation:   Employment situation:  (Retired) Patient's job has been impacted by current illness: No What is the longest time patient has a held a job?: 30  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 Has patient ever been in the [employer]?: No Has patient ever served in combat?: No Did You Receive Any Psychiatric Treatment/Services While in Passenger transport manager?: No Are There Guns or Other Weapons in [place]?: No  Financial Resources:   Museum/gallery curator resources: Armed forces training and education officer (Also receives small pension) Does patient have a Programmer, applications or guardian?: No  Alcohol/Substance Abuse:   What has been your use of drugs/alcohol within the last 12 months?: None Alcohol/Substance Abuse Treatment Hx: Denies past history Has alcohol/substance abuse ever caused legal problems?: No  Social Support System:   Heritage manager System: Manufacturing engineer System: "Good neighbors and friends and relatives; yet I do push others away and isolate" Type of faith/religion: Darrick Meigs How does patient's faith help to cope with current illness?: Helps to attend services although pt reports she rarely goes  Leisure/Recreation:   Leisure and Hobbies: Pt reports "Not a lot I am enjoying now; I am  an  avid reader"  Strengths/Needs:   What things does the patient do well?: "Not a lot these days" In what areas does patient struggle / problems for patient: Loneliness  Discharge Plan:   Does patient have access to transportation?: Yes (Two adult sons) Will patient be returning to same living situation after discharge?: Yes Currently receiving community mental health services: Yes (From Whom) (Pt's PCP, Dr [name], and [name] with [facility]) Does patient have financial barriers related to discharge medications?: No  Summary/Recommendations:   Summary and Recommendations (to be completed by the evaluator): Patient is 75 YO retired widowed female Caucasian admitted with a diagnosis of Major Depressive Disorder, Recurrent Severe. Pt presented to hospital with son who reports concerns regarding mother who has been isolating, with decreased ADL's spending much of her time in bed and had consumed no food three days prior to admit. Pt will benefit from crisis stabilization, medication evaluation, group therapy and psycho education in addition to case management for discharge planning. At Discharge it is recommended that patient remain compliant with established discharger plan and continued treatment. Discharge Process and Patient Expectations information sheet read to pt who refused to sign "unless and until my pain is addressed" thus it was signed only by CSW and inserted in patient's shadow chart. Pt is nonsmoker thus referral to [place] Quit line not applicable.   [name]. 06/18/2015

## 2015-06-18 NOTE — Progress Notes (Signed)
[name] is adamant that she is not receiving her medicaiton according to " how I get them at hoem". She says she does not feel " well enough" to get out of the bed...hence she is spending ALL of her time in her bed..  Not attending her groups and / or benefitting from any of the programming that is available to her here. She says " I'll just ry tomorrow".  [name] flat, sad and " in pain constantly". She completed her assessment ( with help) and onit she wrote she has experienced SI today, she contracted with staff for safety and she rated her depression, hopelessness and anxiety " 03/13/09".  A She refused MHT offer to assist her to bathe in handicapped tub today. She reused staff offering to change bed linens today. [name] unhappy and sad and is stuck there. R Pt concerns about medications forwarded to MD. R Safety in place.

## 2015-06-18 NOTE — Progress Notes (Signed)
Active Problems:  MDD (major depressive disorder), recurrent episode, severe ([place]) - per primary team   Leukocytosis - possibly related to UTI, resolved  - follow up on urine culture  - continue doxycycline for total 5 days but if urine culture negative, would stop doxycycline    Hypokalemia - supplemented and WNL, no need for further monitoring    Acute kidney injury - oral intake to be encouraged - Cr trending down, no need for further monitoring   Will sign off, please call me with questions.  Faye Ramsay, MD  [facility] Pager [phone]  If 7PM-7AM, please contact night-coverage [email or web address] Password [email or web address]

## 2015-06-18 NOTE — BHH Group Notes (Signed)
[place] Group Notes: (Clinical Social Work)   06/18/2015      Type of Therapy:  Group Therapy   Participation Level:  Did Not Attend despite MHT prompting   Selmer Dominion, LCSW 06/18/2015, 3:58 PM

## 2015-06-19 MED ORDER — TEMAZEPAM 7.5 MG PO CAPS
7.5000 mg | ORAL_CAPSULE | Freq: Every evening | ORAL | Status: DC | PRN
  Administered 2015-06-19 – 2015-06-25 (×7): 7.5 mg via ORAL
  Filled 2015-06-19 (×7): qty 1

## 2015-06-19 MED ORDER — ONDANSETRON 4 MG PO TBDP
ORAL_TABLET | ORAL | Status: AC
  Administered 2015-06-19: 16:00:00
  Filled 2015-06-19: qty 1

## 2015-06-19 MED ORDER — ONDANSETRON 4 MG PO TBDP
4.0000 mg | ORAL_TABLET | Freq: Three times a day (TID) | ORAL | Status: DC | PRN
  Administered 2015-06-19 – 2015-06-25 (×5): 4 mg via ORAL
  Filled 2015-06-19 (×4): qty 1

## 2015-06-19 MED ORDER — ONDANSETRON 4 MG PO TBDP
4.0000 mg | ORAL_TABLET | Freq: Once | ORAL | Status: AC
  Administered 2015-06-19: 4 mg via ORAL
  Filled 2015-06-19 (×2): qty 1

## 2015-06-19 MED ORDER — DULOXETINE HCL 20 MG PO CPEP
20.0000 mg | ORAL_CAPSULE | Freq: Every day | ORAL | Status: AC
  Administered 2015-06-19: 20 mg via ORAL
  Filled 2015-06-19: qty 1

## 2015-06-19 MED ORDER — SERTRALINE HCL 100 MG PO TABS
100.0000 mg | ORAL_TABLET | Freq: Every day | ORAL | Status: DC
  Administered 2015-06-20: 100 mg via ORAL
  Filled 2015-06-19 (×3): qty 1

## 2015-06-19 NOTE — Progress Notes (Signed)
Adult Psychoeducational Group Note  Date:  06/19/2015 Time:  8:00pm Group Topic/Focus:  Wrap-Up Group:   The focus of this group is to help patients review their daily goal of treatment and discuss progress on daily workbooks.  Participation Level:  Did Not Attend   Additional Comments: pt didn't attend group.   Theodoro Grist D 06/19/2015, 1:33 AM

## 2015-06-19 NOTE — BHH Group Notes (Signed)
[place] LCSW Group Therapy  06/19/2015   9 AM  Type of Therapy:  Group Therapy  Participation Level:  Did Not Attend; Patient reported she would not attend as "I'm finally getting some relief from this pain so leave me alone."   Summary of Progress/Problems: The main focus of today's process group was to identify the patient's current support system and decide on other supports that can be put in place. An emphasis was placed on using counselor, doctor, therapy groups, 12-step groups, and problem-specific support groups to expand supports. There was also an extensive discussion about what constitutes a healthy support versus an unhealthy support.  Sheilah Pigeon, LCSW

## 2015-06-19 NOTE — Progress Notes (Signed)
Patient ID: ANNE KILMON, female   DOB: May 26, 1941, 75 y.o.   MRN: [id number] D: MHT reports client is in room calling for her cat and asking for help locating her cat. A: Writer assessed client, VS taken BP 155/84, P 109, Temp 98.7, O2 sat 100 %. Writer oriented client to time and place, noting that her cat was not in the building. Writer reported to PA, no new orders received. Staff will continue to monitor q74min for safety. R: Client mental status shows period of disorganization to organization, suspect it may have been selective as client exclaims after about an hour "I think I has a night mare, it must have been a bad one, I think it happened the other night also" From this point on client is clear and organized and continues to ask for PRN medications on a timely fashion. When they are not schedule she complains of other issues. [name] somatic.

## 2015-06-19 NOTE — Progress Notes (Signed)
[name] spent the entire day in her bed...refusing to bathe, allow this writer to change her bed sheets and / or to attend her groups. She did answer her daily assessment questions as follows: she did admit to Jud and was able to contract for safety with this nurse, she rated her depression, hopelessness and anxiety as "08/04/08", respectively. A She admits she is " not ready" to engage in active recovery and group work., R She received prn imitrex for c/o of migraine H/A and prn ativan for c/o anxiety  And prn zofran for c/o nausea, with minimal relief noted. Safety in place.

## 2015-06-19 NOTE — Progress Notes (Signed)
Patient ID: [name], female   DOB: 08/12/1940, 75 y.o.   MRN: [id number] D: Client seen on the phone, but stay in bed the majority of the day. Client reports depression "6" of 10. Client complains that creatine was up and she need to be sure staff keeps fluid by her bedside. Client denies harmful thoughts. Client is disgruntled and unhappy, complains constantly. Client continues to make references to going back to the hospital "they sent me over here to soon, they should have kept me over there until all this was resolved and gave me IVF" Client has bathed and has on a clean gown, says "I feel much better" A: Writer encouraged client to consider getting up to get medications and drinks. Medications reviewed, administered as prescribed. Staff will monitor q64min for safety. R: Client is safe on the unit, did not attend group.

## 2015-06-19 NOTE — Progress Notes (Signed)
[facility] MD Progress Note  06/19/2015 1:13 PM [name]  MRN:  [id number]   Subjective: Patient reports " I am not having a good day today at all, I have chronic leg and back pain and I have just been in this room dealing with the pain."  Objective: [name] is awake, alert and oriented X3 , found resting in bedroom.  Denies suicidal or homicidal ideation. Denies auditory or visual hallucination and does not appear to be responding to internal stimuli. Patient reports staying in her bedroom and not attending group sessions.  Patient reports she is medication compliant without mediation side effects. Patient is asking for all medications to be increased or she would like to be discharged. Denies  learning new coping skills or attending group session due to pain in lower back and legs. States her depression 10/10. Patient states "I am not having a food day today at all, I have chronic leg and back pain. States she doesn't want to leave her bedroom, just wants to be in the dark with peace. States she has many stressors that effects her ability to deal with her depression. Reports the passing of her husband and hip and back surgeries that is causing her depression.  Reports okay appetite and resting poorly due to pain. Support, encouragement and reassurance was provided.   Principal Problem: MDD (major depressive disorder), recurrent episode, severe ([place]) Diagnosis:   Patient Active Problem List   Diagnosis Date Noted  . MDD (major depressive disorder), recurrent episode, severe ([place]) [F33.2] 06/16/2015  . Lumbar stenosis [M48.06] 04/19/2015  . Hypokalemia [E87.6] 09/19/2014  . Constipation [K59.00] 09/19/2014  . Acute encephalopathy [G93.40] 09/18/2014  . Acute respiratory failure ([place]) [J96.00] 09/17/2014  . OSA (obstructive sleep apnea) [G47.33] 09/17/2014  . Ankle fracture, lateral malleolus, closed [S82.63XA] 09/14/2014  . Major depressive disorder, recurrent severe without psychotic  features ([place]) [F33.2] 08/07/2014  . Insomnia [G47.00] 08/05/2014  . Hip joint painful on movement [M25.559] 08/05/2014  . Degenerative disc disease, lumbar [M51.36] 08/05/2014  . Severe obesity (BMI >= 40) ([place]) [E66.01] 01/11/2014  . Hyperlipemia [E78.5]   . Depression [F32.9]   . Arthritis [M19.90]   . Polyneuropathy in other diseases classified elsewhere ([place]) [G63] 04/08/2013  . Restless legs syndrome (RLS) [G25.81] 04/08/2013  . UTI (lower urinary tract infection) [N39.0] 10/14/2011  . Dizziness [R42] 10/12/2011  . Fall [W19.XXXA] 10/12/2011  . TIA (transient ischemic attack) [G45.9] 10/12/2011  . Cellulitis [L03.90] 10/12/2011  . HTN (hypertension) [I10] 10/12/2011  . GERD (gastroesophageal reflux disease) [K21.9] 10/12/2011   Total Time spent with patient: 30 minutes  Past Psychiatric History: SEE ABOVE   Past Medical History:  Past Medical History  Diagnosis Date  . GERD (gastroesophageal reflux disease)     hx pud '98  . Arthritis     djd  . Restless leg syndrome     takes Sinemet daily  . 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 Dysrhythmia     hx tachy palpitations  . Neuromuscular disorder ([place])     peripheral neuropathy, FEET AND LEGS  . Stroke [facility])     ? 6 months ago - tests okay - [facility]   . Cellulitis     10/11/11 hospitalized for cellulitis   . Hyperlipemia     takes Atorvastatin daily  . Peripheral neuropathy ([place])   . Elbow fracture, left April 2015  . Depression     takes Zoloft daily  .  Vertigo     takes Meclizine daily as needed  . Insomnia     takes restoril nightly as needed  . Hypertension     takes HCTZ daily  . Peripheral neuropathy ([facility])     takes Gabapentin daily  . History of bronchitis     many yrs ago  . Headache(784.0)     migraines yrs ago-takes Imitrex daily  . Joint pain   . Joint swelling   . Chronic back pain     scoliosis/stenosis  . Anxiety     takes Ativan daily as needed  . Cataracts, bilateral      immature   . Depression     Past Surgical History  Procedure Laterality Date  . Cervical disc surgery x2  2011  . Rt carotid enarterectomy  2011  . Rt knee arthroscopy  '99  . Left knee arthroscopy  2001  . Hematoma evacuation  2011    s/p rt cea  . Joint replacement  '01    total knee replacement, LEFT  . Abdominal hysterectomy  1975    bil oophorectomy  . Back surgery   MANY YRS AGO    laminectomy x3  . Cholecystectomy  1993  . Total knee arthroplasty  09/13/2011    Procedure: TOTAL KNEE ARTHROPLASTY;  Surgeon: Magnus Sinning, MD;  Location: [facility];  Service: Orthopedics;  Laterality: Right;  . Right knee replacement      09/2011   . Carpal tunnel release  07/09/2012    Procedure: CARPAL TUNNEL RELEASE;  Surgeon: Magnus Sinning, MD;  Location: [facility];  Service: Orthopedics;  Laterality: Left;  . Finger arthroplasty  07/09/2012    Procedure: FINGER ARTHROPLASTY;  Surgeon: Magnus Sinning, MD;  Location: [facility];  Service: Orthopedics;  Laterality: Left;  Interposition Arthroplasty CMC Joint Thumb Left   . Lipoma excision  07/09/2012    Procedure: EXCISION LIPOMA;  Surgeon: Magnus Sinning, MD;  Location: [facility];  Service: Orthopedics;  Laterality: Left;  Excision Lipoma Dorsum Left Wrist   . Orif ankle fracture Right 09/14/2014    FIBULA   . Orif ankle fracture Right 09/14/2014    Procedure: OPEN REDUCTION INTERNAL FIXATION (ORIF) RIGHT ANKLE FRACTURE/SYNDESMOSIS ;  Surgeon: Wylene Simmer, MD;  Location: [place];  Service: Orthopedics;  Laterality: Right;  . Colonosocpy    . Esophagogastroduodenoscopy    . Lumbar laminectomy with coflex 1 level Bilateral 04/19/2015    Procedure: LUMBAR TWO-THREE LUMBAR LAMINECTOMY WITH COFLEX ;  Surgeon: Kristeen Miss, MD;  Location: [place] [facility];  Service: Neurosurgery;  Laterality: Bilateral;  Bilateral L23 laminectomy and foraminotomy with coflex   Family History:  Family History  Problem Relation Age of Onset  . Congestive Heart Failure Father    . Congestive Heart Failure Sister   . Alzheimer's disease Mother   . Diabetes Brother   . Arthritis Brother    Family Psychiatric  History: SEE ABOVE Social History:  History  Alcohol Use No     History  Drug Use No    Social History   Social History  . Marital Status: Widowed    Spouse Name: N/A  . Number of Children: 2  . Years of Education: 74   Social History Main Topics  . Smoking status: Never Smoker   . Smokeless tobacco: Never Used  . Alcohol Use: No  . Drug Use: No  . Sexual Activity: Yes    Birth Control/ Protection: Surgical   Other Topics  Concern  . None   Social History Narrative   Patient is widowed and lives alone.   Patient has two sons.   Patient is retired.   Patient has a college education.   Patient is right-handed.   Patient drinks three glasses of Coke-soda daily.         Additional Social History:                         Sleep: Fair  Appetite:  Good  Current Medications: Current Facility-Administered Medications  Medication Dose Route Frequency Provider Last Rate Last Dose  . acetaminophen (TYLENOL) tablet 650 mg  650 mg Oral Q6H PRN Delfin Gant, NP   650 mg at 06/17/15 2259  . aspirin tablet 325 mg  325 mg Oral Daily Delfin Gant, NP   325 mg at 06/19/15 0811  . atorvastatin (LIPITOR) tablet 40 mg  40 mg Oral Daily Delfin Gant, NP   40 mg at 06/19/15 0811  . calcium carbonate (OS-CAL - dosed in mg of elemental calcium) tablet 500 mg of elemental calcium  1 tablet Oral Q breakfast Delfin Gant, NP   500 mg of elemental calcium at 06/19/15 0811  . doxycycline (VIBRA-TABS) tablet 100 mg  100 mg Oral Q12H Theodis Blaze, MD   100 mg at 06/19/15 1937  . DULoxetine (CYMBALTA) DR capsule 20 mg  20 mg Oral Daily Derrill Center, NP      . feeding supplement (ENSURE ENLIVE) (ENSURE ENLIVE) liquid 237 mL  237 mL Oral Q24H Maricela Bo [place], RD   237 mL at 06/17/15 1000  . gabapentin (NEURONTIN) capsule 600  mg  600 mg Oral TID Jenne Campus, MD   600 mg at 06/19/15 0811  . lidocaine (LIDODERM) 5 % 1 patch  1 patch Transdermal Daily Delfin Gant, NP   1 patch at 06/19/15 0810  . LORazepam (ATIVAN) tablet 0.5 mg  0.5 mg Oral Q8H PRN Kerrie Buffalo, NP   0.5 mg at 06/19/15 0140  . meclizine (ANTIVERT) tablet 25 mg  25 mg Oral Daily PRN Delfin Gant, NP      . pneumococcal 23 valent vaccine (PNU-IMMUNE) injection 0.5 mL  0.5 mL Intramuscular Tomorrow-1000 Kerrie Buffalo, NP      . potassium chloride (K-DUR,KLOR-CON) CR tablet 10 mEq  10 mEq Oral Daily Theodis Blaze, MD   10 mEq at 06/19/15 0811  . pyridOXINE (VITAMIN B-6) tablet 50 mg  50 mg Oral Daily Delfin Gant, NP   50 mg at 06/19/15 0811  . sertraline (ZOLOFT) tablet 150 mg  150 mg Oral Daily Delfin Gant, NP   150 mg at 06/19/15 0811  . SUMAtriptan (IMITREX) tablet 50 mg  50 mg Oral Q2H PRN Delfin Gant, NP   50 mg at 06/19/15 0317  . traMADol (ULTRAM) tablet 100 mg  100 mg Oral Q8H PRN Jenne Campus, MD   100 mg at 06/19/15 9024    Lab Results:  Results for orders placed or performed during the hospital encounter of 06/16/15 (from the past 48 hour(s))  Urinalysis, Routine w reflex microscopic (not at [facility])     Status: Abnormal   Collection Time: 06/17/15  2:17 PM  Result Value Ref Range   Color, Urine YELLOW YELLOW   APPearance CLOUDY (A) CLEAR   Specific Gravity, Urine 1.016 1.005 - 1.030   pH 5.5 5.0 - 8.0   Glucose,  UA NEGATIVE NEGATIVE mg/dL   Hgb urine dipstick NEGATIVE NEGATIVE   Bilirubin Urine NEGATIVE NEGATIVE   Ketones, ur NEGATIVE NEGATIVE mg/dL   Protein, ur NEGATIVE NEGATIVE mg/dL   Nitrite NEGATIVE NEGATIVE   Leukocytes, UA MODERATE (A) NEGATIVE    Comment: Performed at [facility]  Urine rapid drug screen (hosp performed)not at [facility]     Status: Abnormal   Collection Time: 06/17/15  2:17 PM  Result Value Ref Range   Opiates NONE DETECTED NONE DETECTED   Cocaine  NONE DETECTED NONE DETECTED   Benzodiazepines POSITIVE (A) NONE DETECTED   Amphetamines NONE DETECTED NONE DETECTED   Tetrahydrocannabinol NONE DETECTED NONE DETECTED   Barbiturates NONE DETECTED NONE DETECTED    Comment:        DRUG SCREEN FOR MEDICAL PURPOSES ONLY.  IF CONFIRMATION IS NEEDED FOR ANY PURPOSE, NOTIFY LAB WITHIN 5 DAYS.        LOWEST DETECTABLE LIMITS FOR URINE DRUG SCREEN Drug Class       Cutoff (ng/mL) Amphetamine      1000 Barbiturate      200 Benzodiazepine   425 Tricyclics       956 Opiates          300 Cocaine          300 THC              50 Performed at [facility]   Urine microscopic-add on     Status: Abnormal   Collection Time: 06/17/15  2:17 PM  Result Value Ref Range   Squamous Epithelial / LPF 0-5 (A) NONE SEEN   WBC, UA 6-30 0 - 5 WBC/hpf   RBC / HPF 0-5 0 - 5 RBC/hpf   Bacteria, UA FEW (A) NONE SEEN    Comment: Performed at [facility]  Lipid panel     Status: Abnormal   Collection Time: 06/18/15  7:26 AM  Result Value Ref Range   Cholesterol 218 (H) 0 - 200 mg/dL   Triglycerides 206 (H) <150 mg/dL   HDL 40 (L) >40 mg/dL   Total CHOL/HDL Ratio 5.5 RATIO   VLDL 41 (H) 0 - 40 mg/dL   LDL Cholesterol 137 (H) 0 - 99 mg/dL    Comment:        Total Cholesterol/HDL:CHD Risk Coronary Heart Disease Risk Table                     Men   Women  1/2 Average Risk   3.4   3.3  Average Risk       5.0   4.4  2 X Average Risk   9.6   7.1  3 X Average Risk  23.4   11.0        Use the calculated Patient Ratio above and the CHD Risk Table to determine the patient's CHD Risk.        ATP III CLASSIFICATION (LDL):  <100     mg/dL   Optimal  100-129  mg/dL   Near or Above                    Optimal  130-159  mg/dL   Borderline  160-189  mg/dL   High  >190     mg/dL   Very High Performed at [facility]   Magnesium     Status: None   Collection Time: 06/18/15  7:26 AM  Result Value  Ref Range    Magnesium 2.0 1.7 - 2.4 mg/dL    Comment: Performed at [facility]  CBC     Status: None   Collection Time: 06/18/15  7:26 AM  Result Value Ref Range   WBC 8.6 4.0 - 10.5 K/uL   RBC 4.32 3.87 - 5.11 MIL/uL   Hemoglobin 13.0 12.0 - 15.0 g/dL   HCT 40.5 36.0 - 46.0 %   MCV 93.8 78.0 - 100.0 fL   MCH 30.1 26.0 - 34.0 pg   MCHC 32.1 30.0 - 36.0 g/dL   RDW 13.5 11.5 - 15.5 %   Platelets 278 150 - 400 K/uL    Comment: Performed at [facility] metabolic panel     Status: Abnormal   Collection Time: 06/18/15  7:26 AM  Result Value Ref Range   Sodium 137 135 - 145 mmol/L   Potassium 3.7 3.5 - 5.1 mmol/L   Chloride 95 (L) 101 - 111 mmol/L   CO2 28 22 - 32 mmol/L   Glucose, Bld 107 (H) 65 - 99 mg/dL   BUN 28 (H) 6 - 20 mg/dL   Creatinine, Ser 1.29 (H) 0.44 - 1.00 mg/dL   Calcium 9.6 8.9 - 10.3 mg/dL   GFR calc non Af Amer 40 (L) >60 mL/min   GFR calc Af Amer 46 (L) >60 mL/min    Comment: (NOTE) The eGFR has been calculated using the CKD EPI equation. This calculation has not been validated in all clinical situations. eGFR's persistently <60 mL/min signify possible Chronic Kidney Disease.    Anion gap 14 5 - 15    Comment: Performed at [facility]    Physical Findings: AIMS: Facial and Oral Movements Muscles of Facial Expression: None, normal Lips and Perioral Area: None, normal Jaw: None, normal Tongue: None, normal,Extremity Movements Upper (arms, wrists, hands, fingers): None, normal Lower (legs, knees, ankles, toes): None, normal, Trunk Movements Neck, shoulders, hips: None, normal, Overall Severity Severity of abnormal movements (highest score from questions above): None, normal Incapacitation due to abnormal movements: None, normal Patient's awareness of abnormal movements (rate only patient's report): No Awareness, Dental Status Current problems with teeth and/or dentures?: No Does patient usually wear  dentures?: No  CIWA:    COWS:     Musculoskeletal: Strength & Muscle Tone: within normal limits Gait & Station: unsteady patient use walker/wheelchaire for ambulation assistances Patient leans: N/A  Psychiatric Specialty Exam: Review of Systems  Constitutional: Negative.   HENT: Negative.   Eyes: Negative.   Musculoskeletal: Positive for back pain.       Leg pain 10/10  Skin: Negative.   Neurological: Positive for tingling.  Psychiatric/Behavioral: Positive for depression. The patient is nervous/anxious and has insomnia.   All other systems reviewed and are negative.   Blood pressure 100/53, pulse 94, temperature 97.5 F (36.4 C), temperature source Oral, resp. rate 18, height 5' 3"  (1.6 m), weight 88.905 kg (196 lb), SpO2 100 %.Body mass index is 34.73 kg/(m^2).  General Appearance: Fairly Groomed and Guarded  Engineer, water::  Minimal  Speech:  Clear and Coherent  Volume:  Normal fluctuation   Mood:  Anxious, Depressed and Irritable  Affect:  Depressed, Labile and Tearful   Thought Process:  Coherent and Logical  Orientation:  Full (Time, Place, and Person)  Thought Content:  Rumination with pain and medications and lower back pain  Suicidal Thoughts:  No  Homicidal Thoughts:  No  Memory:  Immediate;  Fair Recent;   Fair Remote;   Fair  Judgement:  Poor  Insight:  Lacking  Psychomotor Activity:  Restlessness  Concentration:  Fair  Recall:  Poor  Fund of Knowledge:Poor  Language: Poor  Akathisia:  No  Handed:  Right  AIMS (if indicated):     Assets:  Communication Skills Desire for Improvement Social Support  ADL's:  Intact  Cognition: WNL  Sleep:  Number of Hours: 1     I agree with current treatment plan on 01/15//2016, Patient seen face-to-face for psychiatric evaluation follow-up, chart reviewed and discussed with MD Eappen.  Consult order was reviewed. Labs Orders: BMP for 06/18/2014 and UA/ pending results.  Reviewed the information documented and agree with  the treatment plan.  Treatment Plan Summary:  Daily contact with patient to assess and evaluate symptoms and progress in treatment and Medication management Continue ZOLOFT 150 mgrs QDAY for depression.  As Per patient/ home medication list,  Continue NEURONTIN to 600 mgrs TID.  *As per Pharmacist recommendation will not increase further at this time due to creatinine clearance status.  Will increase ULTRAM to 100 mgrs Q 8 hours PRN severe pain. Patient aware of potential side effects , risk of serotonin syndrome but states she has had no side effects from this medication thus far . D/C Temazepam QHS PRN to Minimize potential sedation, fall risk  Start Cymbalta 20 mg for mood stabilization/ neuropathy pain and start Zoloft taper  Consultations: Will order PT consult to address possible use of walker for ambulation. Have requested Hospitalist Consult to help address medical issues such as increased Creatinine , low K+, possible UTI.   PER Consult: Leukocytosis - possibly related to UTI - check UA and urine culture - Pending results - CBC In AM-resulted WNL - place on empiric Doxy for now -Started  Hypokalemia: - supplement and repeat BMP In AM  Acute kidney injury: - oral intake to be encouraged - if Cr continues trending up, may need to hold lisinopril  - BMP In AM/ Per consult. Labs on 06/18/2014   [place] 06/19/2015, 1:13 PM

## 2015-06-20 ENCOUNTER — Ambulatory Visit: Payer: Medicare Other | Admitting: Internal Medicine

## 2015-06-20 DIAGNOSIS — M545 Low back pain: Secondary | ICD-10-CM

## 2015-06-20 DIAGNOSIS — M549 Dorsalgia, unspecified: Secondary | ICD-10-CM | POA: Diagnosis present

## 2015-06-20 LAB — HEMOGLOBIN A1C
Hgb A1c MFr Bld: 6.1 % — ABNORMAL HIGH (ref 4.8–5.6)
MEAN PLASMA GLUCOSE: 128 mg/dL

## 2015-06-20 LAB — URINE CULTURE

## 2015-06-20 MED ORDER — SERTRALINE HCL 50 MG PO TABS
50.0000 mg | ORAL_TABLET | Freq: Every day | ORAL | Status: DC
  Administered 2015-06-21: 50 mg via ORAL
  Filled 2015-06-20 (×3): qty 1

## 2015-06-20 MED ORDER — HYDROCODONE-ACETAMINOPHEN 5-325 MG PO TABS
1.0000 | ORAL_TABLET | Freq: Four times a day (QID) | ORAL | Status: DC | PRN
  Administered 2015-06-20 – 2015-06-21 (×3): 1 via ORAL
  Filled 2015-06-20 (×3): qty 1

## 2015-06-20 MED ORDER — DULOXETINE HCL 30 MG PO CPEP
30.0000 mg | ORAL_CAPSULE | Freq: Every day | ORAL | Status: AC
  Administered 2015-06-20: 30 mg via ORAL
  Filled 2015-06-20 (×2): qty 1

## 2015-06-20 MED ORDER — DICLOFENAC EPOLAMINE 1.3 % TD PTCH
1.0000 | MEDICATED_PATCH | Freq: Two times a day (BID) | TRANSDERMAL | Status: DC
  Administered 2015-06-20: 1 via TRANSDERMAL
  Filled 2015-06-20 (×7): qty 1

## 2015-06-20 NOTE — Progress Notes (Signed)
D: Patient had multiple complaint about not able to get 1:1 therapy. "if my sons knew I was not getting the the help I need they will not have sent me here". Pt reports she is not supposed to be at [facility] but on a medical floor. Pt pain level is 10 on 0-10 scale and stays at 10 even with medications. Pt denies SI/HI/AVH.  A: Met with pt 1:1. Medications administered as prescribed. Support and encouragement provided.  R: Patient remains safe and complaint with medications. Pt used wheelchair to get out of rooms to get medication and make phone call.

## 2015-06-20 NOTE — Progress Notes (Addendum)
Patient ID: [name], female   DOB: 09-14-1940, 75 y.o.   MRN: [id number]   Pt currently presents with a sad affect and labile behavior. Per self inventory, pt rates depression at a 10, hopelessness 5 and anxiety 10. Pt's daily goal is to "get a little sleep" and they intend to do so by "lay in bed." Pt reports poor sleep, a poor appetite, low energy and poor concentration. Pt becomes tearful when talking about her dead husband, states "I went to one grief session, my sons seem to think I need more." Pt reports that we should help her because we haven't been controlling her medicine and without her medicine she cannot get out of bed. Pt complains mainly of foot pain and less of her back pain today.   Pt provided with medications per providers orders. Pt's labs and vitals were monitored throughout the day. Pt supported emotionally and encouraged to express concerns and questions. Pt educated on medications and alternative pain medication treatments, needs reinforcement. PT consult ordered, pt requests to be medicated before she must walk on her foot. Pt given a 1:1 about her SI, grief and depression.  Pt's safety ensured with 15 minute and environmental checks. Pt endorses some SI and writes "when having pain." Pt currently denies HI and A/V hallucinations. Pt verbally agrees to seek staff if HI or A/VH occurs and to consult with staff before acting on any harmful thoughts. Pt remains in bed all day with the curtains closed. Will continue POC.

## 2015-06-20 NOTE — Progress Notes (Signed)
Pt did not attend wrap-up group   

## 2015-06-20 NOTE — Consult Note (Signed)
[facility] Medical Consultation  KRISTIN DAISEY [id number] DOB: 01/12/1941 DOA: 06/16/2015 PCP: Hollace Kinnier, DO   Requesting physician: [place] Date of consultation: 06/20/15 Reason for consultation: pain management  HPI:  Pt admitted to [facility] as per her son with FTT and progressive decline. TRH consulted due to multiple electrolyte issues and ? UTI management. Pt not providing much history, very depressed.   Patient is complaining of neuropathic foot pain as well as severe low back pain. 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 She has been doing well post op however recently has been having worsening of her back pain. This is the main issue that makes her this depressed.   Review of Systems:  She denies fevers or chills, denies dysuria, denies chest pain / dyspnea. In addition, 10 points ROS negative  Impression/Recommendations Principal Problem:   MDD (major depressive disorder), recurrent episode, severe (HCC) Active Problems:   Back pain   1. Back pain - seems to be a major issue and contributes to her current depression. Add diclofenac patch. Add low dose Norco. 2. Foot pain - neuropathic, already on gabapentin, limited dosing due to renal function. Continue. Norco will help 3. Recent UTI - antibiotics as per original plan, urine culture without specific growth, I would complete 5 days.  I will followup again tomorrow. Please contact me if I can be of assistance in the meanwhile. Thank you for this consultation.   Past Medical History  Diagnosis Date  . GERD (gastroesophageal reflux disease)     hx pud '98  . Arthritis     djd  . Restless leg syndrome     takes Sinemet daily  . 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 Dysrhythmia     hx tachy palpitations  . Neuromuscular disorder ([place])     peripheral neuropathy, FEET AND LEGS  . Stroke [facility])       ? 6 months ago - tests okay - [facility]   . Cellulitis     10/11/11 hospitalized for cellulitis   . Hyperlipemia     takes Atorvastatin daily  . Peripheral neuropathy ([place])   . Elbow fracture, left April 2015  . Depression     takes Zoloft daily  . Vertigo     takes Meclizine daily as needed  . Insomnia     takes restoril nightly as needed  . Hypertension     takes HCTZ daily  . Peripheral neuropathy (HCC)     takes Gabapentin daily  . History of bronchitis     many yrs ago  . Headache(784.0)     migraines yrs ago-takes Imitrex daily  . Joint pain   . Joint swelling   . Chronic back pain     scoliosis/stenosis  . Anxiety     takes Ativan daily as needed  . Cataracts, bilateral     immature   . Depression    Past Surgical History  Procedure Laterality Date  . Cervical disc surgery x2  2011  . Rt carotid enarterectomy  2011  . Rt knee arthroscopy  '99  . Left knee arthroscopy  2001  . Hematoma evacuation  2011    s/p rt cea  . Joint replacement  '01    total knee replacement, LEFT  . Abdominal hysterectomy  1975    bil oophorectomy  . Back surgery   MANY  YRS AGO    laminectomy x3  . Cholecystectomy  1993  . Total knee arthroplasty  09/13/2011    Procedure: TOTAL KNEE ARTHROPLASTY;  Surgeon: Magnus Sinning, MD;  Location: [facility];  Service: Orthopedics;  Laterality: Right;  . Right knee replacement      09/2011   . Carpal tunnel release  07/09/2012    Procedure: CARPAL TUNNEL RELEASE;  Surgeon: Magnus Sinning, MD;  Location: [facility];  Service: Orthopedics;  Laterality: Left;  . Finger arthroplasty  07/09/2012    Procedure: FINGER ARTHROPLASTY;  Surgeon: Magnus Sinning, MD;  Location: [facility];  Service: Orthopedics;  Laterality: Left;  Interposition Arthroplasty CMC Joint Thumb Left   . Lipoma excision  07/09/2012    Procedure: EXCISION LIPOMA;  Surgeon: Magnus Sinning, MD;  Location: [facility];  Service: Orthopedics;  Laterality: Left;  Excision Lipoma Dorsum Left Wrist    . Orif ankle fracture Right 09/14/2014    FIBULA   . Orif ankle fracture Right 09/14/2014    Procedure: OPEN REDUCTION INTERNAL FIXATION (ORIF) RIGHT ANKLE FRACTURE/SYNDESMOSIS ;  Surgeon: Wylene Simmer, MD;  Location: [facility];  Service: Orthopedics;  Laterality: Right;  . Colonosocpy    . Esophagogastroduodenoscopy    . Lumbar laminectomy with coflex 1 level Bilateral 04/19/2015    Procedure: LUMBAR TWO-THREE LUMBAR LAMINECTOMY WITH COFLEX ;  Surgeon: Kristeen Miss, MD;  Location: [place] [facility];  Service: Neurosurgery;  Laterality: Bilateral;  Bilateral L23 laminectomy and foraminotomy with coflex   Social History:  reports that she has never smoked. She has never used smokeless tobacco. She reports that she does not drink alcohol or use illicit drugs.  Allergies  Allergen Reactions  . Contrast Media [Iodinated Diagnostic Agents] Other (See Comments)    Respiratory failure  . Acyclovir And Related Other (See Comments)    unknown  . Sulfa Drugs Cross Reactors Nausea And Vomiting   Family History  Problem Relation Age of Onset  . Congestive Heart Failure Father   . Congestive Heart Failure Sister   . Alzheimer's disease Mother   . Diabetes Brother   . Arthritis Brother     Prior to Admission medications   Medication Sig Start Date End Date Taking? Authorizing Provider  acetaminophen (TYLENOL) 325 MG tablet Take 2 tablets (650 mg total) by mouth every 6 (six) hours as needed for mild pain (or Fever >/= 101). 09/16/14  Yes Wylene Simmer, MD  amoxicillin (AMOXIL) 500 MG capsule Take 2,000 mg by mouth See admin instructions. Must take 4 capsules 1 hour before dental procedures   Yes Historical Provider, MD  aspirin 325 MG tablet Take 325 mg by mouth daily.   Yes Historical Provider, MD  atorvastatin (LIPITOR) 40 MG tablet Take one tablet by mouth once daily for cholesterol Patient taking differently: Take 40 mg by mouth daily.  01/31/15  Yes [name] L [name], DO  calcium carbonate 1250 MG capsule  Take 1,250 mg by mouth daily.    Yes Historical Provider, MD  cholecalciferol (VITAMIN D) 1000 UNITS tablet Take 1,000 Units by mouth daily.   Yes Historical Provider, MD  cloNIDine (CATAPRES) 0.1 MG tablet TAKE 1 TABLET BY MOUTH EVERY 12 HOURS AS NEEDED FOR SYSTOLIC BLOOD PRESSURE [id number] 05/23/15  Yes [name] L [name], DO  fluticasone (FLONASE) 50 MCG/ACT nasal spray SHAKE WELL AND USE 1 SPRAY IN EACH NOSTRIL DAILY AS NEEDED FOR ALLERGIES OR RHINITIS 11/24/14  Yes [name] L [name], DO  gabapentin (NEURONTIN) 600 MG tablet  Take 1 tablet by mouth 4  times daily 06/13/15  Yes Kathrynn Ducking, MD  hydrochlorothiazide (HYDRODIURIL) 25 MG tablet Take 1 tablet by mouth in  the morning Patient taking differently: Take 25 mg by mouth in  the morning 04/13/15  Yes [name] L [name], DO  HYDROcodone-acetaminophen (NORCO/VICODIN) 5-325 MG tablet Take 1 tablet by mouth every 6 (six) hours as needed for moderate pain. Must last 28 days 05/16/15  Yes Kathrynn Ducking, MD  lisinopril (PRINIVIL,ZESTRIL) 20 MG tablet TAKE 1 TABLET BY MOUTH EVERY DAY 06/07/15  Yes [name] L [name], DO  LORazepam (ATIVAN) 1 MG tablet Take 1 tablet (1 mg total) by mouth every 8 (eight) hours as needed for anxiety. 06/13/15  Yes [name] L [name], DO  meclizine (ANTIVERT) 25 MG tablet Take one tablet by mouth once daily as needed for dizziness. Patient taking differently: Take 25 mg by mouth daily as needed for dizziness.  05/26/14  Yes [name] L [name], DO  Phenylephrine HCl (AFRIN ALLERGY NA) Place 2 sprays into the nose daily as needed (Nasal congestion).   Yes Historical Provider, MD  potassium chloride SA (K-DUR,KLOR-CON) 20 MEQ tablet Take 2 tablets by mouth  daily for potassium Patient taking differently: Take 1 tablet= 22meq  by mouth twice daily for potassium 06/13/15  Yes [name] L [name], DO  pyridOXINE (VITAMIN B-6) 50 MG tablet Take 50 mg by mouth daily.   Yes Historical Provider, MD  sertraline (ZOLOFT) 50 MG tablet Take 100 mg by mouth daily.   Yes  Historical Provider, MD  SUMAtriptan (IMITREX) 50 MG tablet See AUX Label Patient taking differently: Take 50 mg by mouth as needed for migraine or headache 11/24/14  Yes [name] L [name], DO  temazepam (RESTORIL) 7.5 MG capsule Take 1 capsule (7.5 mg total) by mouth at bedtime as needed for sleep. 04/13/15  Yes [name] L [name], DO  traMADol (ULTRAM) 50 MG tablet Take 50 mg by mouth every 6 (six) hours as needed. pain 06/13/15  Yes Historical Provider, MD   Physical Exam: Blood pressure 117/54, pulse 95, temperature 97.8 F (36.6 C), temperature source Oral, resp. rate 16, height 5\' 3"  (1.6 m), weight 88.905 kg (196 lb), SpO2 100 %. Filed Vitals:   06/19/15 0926 06/20/15 0839  BP: 100/53 117/54  Pulse: 94 95  Temp: 97.5 F (36.4 C) 97.8 F (36.6 C)  Resp: 18 16    General:  Depressed, crying  Eyes: no scleral icterus  Cardiovascular: RRR  Respiratory: CTA biL  Abdomen: soft, non tender  Skin: no rashes  Musculoskeletal: no edema; back incision well healed  Neurologic: non focal   Labs on Admission:  Basic Metabolic Panel:  Recent Labs Lab 06/15/15 1331 06/18/15 0726  NA 137 137  K 3.2* 3.7  CL 92* 95*  CO2 26 28  GLUCOSE 166* 107*  BUN 15 28*  CREATININE 1.47* 1.29*  CALCIUM 10.3 9.6  MG  --  2.0   Liver Function Tests:  Recent Labs Lab 06/15/15 1331  AST 33  ALT <5*  ALKPHOS 107  BILITOT 0.8  PROT 9.0*  ALBUMIN 4.8   CBC:  Recent Labs Lab 06/15/15 1331 06/18/15 0726  WBC 13.2* 8.6  NEUTROABS 9.1*  --   HGB 15.0 13.0  HCT 48.3* 40.5  MCV 97.4 93.8  PLT 414* Mona, Jacqui Headen [facility] Pager [phone]  If 7PM-7AM, please contact night-coverage [email or web address] Password [facility] 06/20/2015, 3:42 PM

## 2015-06-20 NOTE — BHH Group Notes (Signed)
[place] [facility] LCSW Group Therapy  06/20/2015 1:15pm  Type of Therapy:  Group Therapy vercoming Obstacles  Pt did not attend, declined invitation.   Peri Maris, [place] 06/20/2015 2:59 PM

## 2015-06-20 NOTE — Progress Notes (Signed)
Recreation Therapy Notes  Date: 01.16.2017 Time: 9:30am Location: [address] Group Room  Group Topic: Stress Management  Goal Area(s) Addresses:  Patient will actively participate in stress management techniques presented during session.   Behavioral Response: Did not attend.   Laureen Ochs Morrill Bomkamp, LRT/CTRS        Daman Steffenhagen L 06/20/2015 11:55 AM

## 2015-06-20 NOTE — BHH Group Notes (Signed)
[facility] LCSW Aftercare Discharge Planning Group Note  06/20/2015 8:45 AM  Pt did not attend, declined invitation.   [name], [place] 06/20/2015 9:34 AM

## 2015-06-20 NOTE — Evaluation (Signed)
Physical Therapy Evaluation Patient Details Name: [name] MRN: [id number] DOB: [birth year] Today's Date: 06/20/2015   History of Present Illness  74 you who presetns to ED by her son with depression, crying . Pt today states she has pain in her ankle and her back, and fearful of falling for she hasn't been up a lot lately and her R knee gives way at times. PMH: 04/20/2015 L2-L3  lumbar laminectomy decompression surgery by Dr. Ellene Route, then DC to [facility] for Short Term rehab until 05/09/2015. Then she DC home at that time. PMH also with 09/2014 R ankle ORIF by Dr. 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   Clinical Impression  Pt with limited ability during today's session. Pt stated she was limited by nauseous, pain and became very tearful. "I don't think my sons know what this place is, how do I expect to get better if there are no session one on one with the psychologist?" "I can't believe I feel this bad, I don't know what to do". Spoke with nurse and feel pt is generally deconditioned and if I can work with getting her stronger with PT , and increase mobility she will also have less fear of falling. Recommend continue PT services at this time.     Follow Up Recommendations Home health PT;Supervision - Intermittent (difficult to tell today with limited eval, will continue to assess and update. would ALF be an option? )    Equipment Recommendations  None recommended by PT    Recommendations for Other Services       Precautions / Restrictions Precautions Precautions: Back Precaution Comments: understands these and had back brace after her surgery , but states she was going to ask him if she still needed to wear it at her f/u visit this month.       Mobility  Bed Mobility Overal bed mobility: Modified Independent             General bed mobility comments: did perform proper body mechanices for supine to side lying to sit for LB  precautions and comfort.   Transfers Overall transfer level:  (pt sat EOB for MMT testing etc, and became very nauseated with audible "air bubbles" and sounds but did not throw up. Pt then stated she could not cotninue today and would like to continue tomorrow. and She laid back down and then became tearful again. ) Equipment used:  (was going to use RW which is in pt's room )                Ambulation/Gait Ambulation/Gait assistance:  (pt stated she has been walking to the bathroom , just feels weak and unstaeday like R leg may give way. Has WC by Bedside as well. )              Stairs            Wheelchair Mobility    Modified Rankin (Stroke Patients Only)       Balance                                             Pertinent Vitals/Pain Pain Assessment: 0-10 Pain Score: 9  Pain Location: pt stated her lateral R ankle neuropathy pain is the worst, more than the back pain at this time.  however nursing came in to give her a heat pack for her ankle , however pt placed it on her back.  Pain Descriptors / Indicators: Aching;Burning (aching in her backa dn burning in her ankle area. ) Pain Intervention(s): Limited activity within patient's tolerance;Premedicated before session;Heat applied;Repositioned (also placed a pillow between her Lower legs and feet to help support in that way. also placed a roll at low back to hold on teh heat pack. )    Home Living Family/patient expects to be discharged to:: Private residence Living Arrangements: Alone Available Help at Discharge: Family;Friend(s) (2 sons per pt) Type of Home: House Home Access: Stairs to enter Entrance Stairs-Rails: None Entrance Stairs-Number of Steps: 1 threshold  Home Layout: One level Home Equipment: Walker - 2 wheels;Walker - 4 wheels;Shower seat;Shower seat - built in;Wheelchair - manual Additional Comments: Pt reports having a lift chair that lowers and lifts pt into tub, also has  a walk-in shower with shower seat.    Prior Function Level of Independence: Independent         Comments: Pt was discharged from [facility] beginning of Dec and had f/u [facility] services.      Hand Dominance        Extremity/Trunk Assessment               Lower Extremity Assessment: Generalized weakness (limites assess general fucntional ability in bed, but pt refused OOB today. Sitting with LE MMT WNL, but not after several times with resistance. )         Communication   Communication: No difficulties  Cognition Arousal/Alertness: Awake/alert Behavior During Therapy: WFL for tasks assessed/performed Overall Cognitive Status: Within Functional Limits for tasks assessed (limited sesssion due to pt very tearful, stating " I am the only one that seems to care and listen." )                      General Comments      Exercises        Assessment/Plan    PT Assessment Patient needs continued PT services  PT Diagnosis Generalized weakness;Difficulty walking   PT Problem List Decreased strength;Decreased activity tolerance;Decreased mobility  PT Treatment Interventions Gait training;Functional mobility training;Therapeutic activities;Therapeutic exercise;Patient/family education   PT Goals (Current goals can be found in the Care Plan section) Acute Rehab PT Goals Patient Stated Goal: I want to get better, I can't beleive this is happening to me , I am just so sad I don't know what to do to get better.  PT Goal Formulation: With patient Time For Goal Achievement: 07/04/15 Potential to Achieve Goals: Good    Frequency Min 2X/week   Barriers to discharge        Co-evaluation               End of Session   Activity Tolerance: Patient limited by pain (limited by nauseated and very sad and depressed. ) Patient left: in bed Nurse Communication: Mobility status    Functional Assessment Tool Used: clincial judgement Functional Limitation: Mobility: Walking  and moving around Mobility: Walking and Moving Around Current Status [id number]): At least 20 percent but less than 40 percent impaired, limited or restricted Mobility: Walking and Moving Around Goal Status [phone]): 0 percent impaired, limited or restricted    Time: 1520-1548 PT Time Calculation (min) (ACUTE ONLY): 28 min   Charges:   PT Evaluation $PT Eval Low Complexity: 1 Procedure     PT G Codes:  PT G-Codes **NOT FOR INPATIENT CLASS** Functional Assessment Tool Used: clincial judgement Functional Limitation: Mobility: Walking and moving around Mobility: Walking and Moving Around Current Status [id number]): At least 20 percent but less than 40 percent impaired, limited or restricted Mobility: Walking and Moving Around Goal Status [phone]): 0 percent impaired, limited or restricted    [place] Searles, [facility] 06/20/2015, 4:21 PM Clide Dales, PT Pager: [phone] 06/20/2015

## 2015-06-20 NOTE — Progress Notes (Addendum)
Patient ID: [name], female   DOB: 1940-11-02, 75 y.o.   MRN: [id number]    Pt son called to give an update on pt POC per request from pt. Pt son reports:  Per patients son, pt has not been taking care of herself or her apartment. Police have threatened to evict her due to the unhealthy living conditions. Pt has spent $60,000 on items from [facility] within the past couple of months. Says she has done this once before. Per son "[name] just manipulative as hell. [name] a Marine scientist, she knows what to say." Per son, "she will not get out of bed for days, gets weak and that's when she falls. She doesn't want anyone to see her struggle." Pt son reports that she will not use her wheelchair at home, only the walker. Per son pt cancelled at home PT consultation after last surgery. Pt son reports "I want her to be independent again, to be able to do things for herself."   Pt son reports he is interested in doing a family session but says that his brother is currently out of town. Pt son also reports that he had in home health care set up but that pt did not want to utilize the services. Pt does have a personal friend how offered to look after her but has not set up services yet.

## 2015-06-20 NOTE — Plan of Care (Signed)
Problem: Alteration in mood; excessive anxiety as evidenced by: Goal: STG-Pt will report an absence of self-harm thoughts/actions (Patient will report an absence of self-harm thoughts or actions)  Outcome: Progressing Pt is safe denies SI, intent or plan

## 2015-06-20 NOTE — Progress Notes (Signed)
D: Patient in bed with son visiting. Pt reports feeling helpless because of not able to get up and do for self. " I have had 8 surgeries in 6 years since my husband passed". Pt conplained about not able to do for self but refuses to get out of bed. Pt stated she is waiting for PT consult tomorrow. Pt mood and affect appeared depressed and flat. Pt denies SI/HI/AVH. Cooperative with assessment.  A: Met with pt 1:1. Medications administered as prescribed. Support and encouragement provided to attend groups and engage in milieu.  R: Patient remains safe and complaint with medications.

## 2015-06-20 NOTE — Progress Notes (Addendum)
Patient ID: [name], female   DOB: Nov 16, 1940, 75 y.o.   MRN: [id number] [facility] MD Progress Note  06/20/2015 3:05 PM LURLEEN SOLTERO  MRN:  [id number]   Subjective:  Patient states she continues to feel depressed and " miserable", which she attributes to ongoing pain. She reports her back pain and foot pain are " both 10/10", and states current medications not adequately addressing pain. States her ongoing depression is related to pain. Requesting for Neurontin dose to be increased- we reviewed dosage limitations due to renal function.   Objective:  I have discussed case with treatment team and have met with patient. Patient tending to be isolative, spending most time in bed/ room , with  Limited milieu interaction. Remains depressed, dysphoric and irritable . Denies suicidal ideations, contracts for safety, but states " I would rather die than continue to live with this pain". She denies medication side effects, but states " they do not seem to be working at this time". Patient states she has tried Prozac, Zoloft, and Cymbalta in the past, and does not think medications have been particularly effective thus far . Compared to admission presentation, however, she does present with improved eye contact and increased verbal output, more conversant than on admission.     Principal Problem: MDD (major depressive disorder), recurrent episode, severe ([place]) Diagnosis:   Patient Active Problem List   Diagnosis Date Noted  . MDD (major depressive disorder), recurrent episode, severe ([place]) [F33.2] 06/16/2015  . Lumbar stenosis [M48.06] 04/19/2015  . Hypokalemia [E87.6] 09/19/2014  . Constipation [K59.00] 09/19/2014  . Acute encephalopathy [G93.40] 09/18/2014  . Acute respiratory failure ([place]) [J96.00] 09/17/2014  . OSA (obstructive sleep apnea) [G47.33] 09/17/2014  . Ankle fracture, lateral malleolus, closed [S82.63XA] 09/14/2014  . Major depressive disorder, recurrent severe without psychotic  features ([place]) [F33.2] 08/07/2014  . Insomnia [G47.00] 08/05/2014  . Hip joint painful on movement [M25.559] 08/05/2014  . Degenerative disc disease, lumbar [M51.36] 08/05/2014  . Severe obesity (BMI >= 40) ([place]) [E66.01] 01/11/2014  . Hyperlipemia [E78.5]   . Depression [F32.9]   . Arthritis [M19.90]   . Polyneuropathy in other diseases classified elsewhere ([place]) [G63] 04/08/2013  . Restless legs syndrome (RLS) [G25.81] 04/08/2013  . UTI (lower urinary tract infection) [N39.0] 10/14/2011  . Dizziness [R42] 10/12/2011  . Fall [W19.XXXA] 10/12/2011  . TIA (transient ischemic attack) [G45.9] 10/12/2011  . Cellulitis [L03.90] 10/12/2011  . HTN (hypertension) [I10] 10/12/2011  . GERD (gastroesophageal reflux disease) [K21.9] 10/12/2011   Total Time spent with patient:  25 minutes   Past Psychiatric History: SEE ABOVE   Past Medical History:  Past Medical History  Diagnosis Date  . GERD (gastroesophageal reflux disease)     hx pud '98  . Arthritis     djd  . Restless leg syndrome     takes Sinemet daily  . 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 Dysrhythmia     hx tachy palpitations  . Neuromuscular disorder ([place])     peripheral neuropathy, FEET AND LEGS  . Stroke [facility])     ? 6 months ago - tests okay - [facility]   . Cellulitis     10/11/11 hospitalized for cellulitis   . Hyperlipemia     takes Atorvastatin daily  . Peripheral neuropathy ([place])   . Elbow fracture, left April 2015  . Depression     takes Zoloft daily  . Vertigo     takes Meclizine  daily as needed  . Insomnia     takes restoril nightly as needed  . Hypertension     takes HCTZ daily  . Peripheral neuropathy ([facility])     takes Gabapentin daily  . History of bronchitis     many yrs ago  . Headache(784.0)     migraines yrs ago-takes Imitrex daily  . Joint pain   . Joint swelling   . Chronic back pain     scoliosis/stenosis  . Anxiety     takes Ativan daily as needed  . Cataracts, bilateral      immature   . Depression     Past Surgical History  Procedure Laterality Date  . Cervical disc surgery x2  2011  . Rt carotid enarterectomy  2011  . Rt knee arthroscopy  '99  . Left knee arthroscopy  2001  . Hematoma evacuation  2011    s/p rt cea  . Joint replacement  '01    total knee replacement, LEFT  . Abdominal hysterectomy  1975    bil oophorectomy  . Back surgery   MANY YRS AGO    laminectomy x3  . Cholecystectomy  1993  . Total knee arthroplasty  09/13/2011    Procedure: TOTAL KNEE ARTHROPLASTY;  Surgeon: Magnus Sinning, MD;  Location: [facility];  Service: Orthopedics;  Laterality: Right;  . Right knee replacement      09/2011   . Carpal tunnel release  07/09/2012    Procedure: CARPAL TUNNEL RELEASE;  Surgeon: Magnus Sinning, MD;  Location: [facility];  Service: Orthopedics;  Laterality: Left;  . Finger arthroplasty  07/09/2012    Procedure: FINGER ARTHROPLASTY;  Surgeon: Magnus Sinning, MD;  Location: [facility];  Service: Orthopedics;  Laterality: Left;  Interposition Arthroplasty [facility] Joint Thumb Left   . Lipoma excision  07/09/2012    Procedure: EXCISION LIPOMA;  Surgeon: Magnus Sinning, MD;  Location: [facility];  Service: Orthopedics;  Laterality: Left;  Excision Lipoma Dorsum Left Wrist   . Orif ankle fracture Right 09/14/2014    FIBULA   . Orif ankle fracture Right 09/14/2014    Procedure: OPEN REDUCTION INTERNAL FIXATION (ORIF) RIGHT ANKLE FRACTURE/SYNDESMOSIS ;  Surgeon: Wylene Simmer, MD;  Location: [place];  Service: Orthopedics;  Laterality: Right;  . Colonosocpy    . Esophagogastroduodenoscopy    . Lumbar laminectomy with coflex 1 level Bilateral 04/19/2015    Procedure: LUMBAR TWO-THREE LUMBAR LAMINECTOMY WITH COFLEX ;  Surgeon: Kristeen Miss, MD;  Location: [place] [facility];  Service: Neurosurgery;  Laterality: Bilateral;  Bilateral L23 laminectomy and foraminotomy with coflex   Family History:  Family History  Problem Relation Age of Onset  . Congestive Heart Failure Father    . Congestive Heart Failure Sister   . Alzheimer's disease Mother   . Diabetes Brother   . Arthritis Brother    Family Psychiatric  History: SEE ABOVE Social History:  History  Alcohol Use No     History  Drug Use No    Social History   Social History  . Marital Status: Widowed    Spouse Name: N/A  . Number of Children: 2  . Years of Education: 67   Social History Main Topics  . Smoking status: Never Smoker   . Smokeless tobacco: Never Used  . Alcohol Use: No  . Drug Use: No  . Sexual Activity: Yes    Birth Control/ Protection: Surgical   Other Topics Concern  . None   Social  History Narrative   Patient is widowed and lives alone.   Patient has two sons.   Patient is retired.   Patient has a college education.   Patient is right-handed.   Patient drinks three glasses of Coke-soda daily.         Additional Social History:   Sleep:  Poor   Appetite:   Fair   Current Medications: Current Facility-Administered Medications  Medication Dose Route Frequency Provider Last Rate Last Dose  . acetaminophen (TYLENOL) tablet 650 mg  650 mg Oral Q6H PRN Delfin Gant, NP   650 mg at 06/17/15 2259  . aspirin tablet 325 mg  325 mg Oral Daily Delfin Gant, NP   325 mg at 06/20/15 0828  . atorvastatin (LIPITOR) tablet 40 mg  40 mg Oral Daily Delfin Gant, NP   40 mg at 06/20/15 0828  . calcium carbonate (OS-CAL - dosed in mg of elemental calcium) tablet 500 mg of elemental calcium  1 tablet Oral Q breakfast Delfin Gant, NP   500 mg of elemental calcium at 06/20/15 0828  . doxycycline (VIBRA-TABS) tablet 100 mg  100 mg Oral Q12H Theodis Blaze, MD   100 mg at 06/20/15 6389  . feeding supplement (ENSURE ENLIVE) (ENSURE ENLIVE) liquid 237 mL  237 mL Oral Q24H Maricela Bo [place], RD   237 mL at 06/19/15 1015  . gabapentin (NEURONTIN) capsule 600 mg  600 mg Oral TID Jenne Campus, MD   600 mg at 06/20/15 1205  . lidocaine (LIDODERM) 5 % 1 patch  1 patch  Transdermal Daily Delfin Gant, NP   1 patch at 06/20/15 0829  . LORazepam (ATIVAN) tablet 0.5 mg  0.5 mg Oral Q8H PRN Kerrie Buffalo, NP   0.5 mg at 06/20/15 1006  . meclizine (ANTIVERT) tablet 25 mg  25 mg Oral Daily PRN Delfin Gant, NP      . ondansetron (ZOFRAN-ODT) disintegrating tablet 4 mg  4 mg Oral Q8H PRN Jenne Campus, MD   4 mg at 06/20/15 1340  . pneumococcal 23 valent vaccine (PNU-IMMUNE) injection 0.5 mL  0.5 mL Intramuscular Tomorrow-1000 Kerrie Buffalo, NP      . potassium chloride (K-DUR,KLOR-CON) CR tablet 10 mEq  10 mEq Oral Daily Theodis Blaze, MD   10 mEq at 06/19/15 0811  . pyridOXINE (VITAMIN B-6) tablet 50 mg  50 mg Oral Daily Delfin Gant, NP   50 mg at 06/20/15 0826  . sertraline (ZOLOFT) tablet 100 mg  100 mg Oral Daily Derrill Center, NP   100 mg at 06/20/15 0826  . SUMAtriptan (IMITREX) tablet 50 mg  50 mg Oral Q2H PRN Delfin Gant, NP   50 mg at 06/20/15 1337  . temazepam (RESTORIL) capsule 7.5 mg  7.5 mg Oral QHS PRN Harriet Butte, NP   7.5 mg at 06/19/15 2201  . traMADol (ULTRAM) tablet 100 mg  100 mg Oral Q8H PRN Jenne Campus, MD   100 mg at 06/20/15 0346    Lab Results:  Results for orders placed or performed during the hospital encounter of 06/16/15 (from the past 48 hour(s))  Urine culture     Status: None   Collection Time: 06/18/15  6:10 PM  Result Value Ref Range   Specimen Description      URINE, RANDOM Performed at [facility]    Special Requests      NONE Performed at [facility]  Culture      MULTIPLE SPECIES PRESENT, SUGGEST RECOLLECTION Performed at [facility]    Report Status 06/20/2015 FINAL     Physical Findings: AIMS: Facial and Oral Movements Muscles of Facial Expression: None, normal Lips and Perioral Area: None, normal Jaw: None, normal Tongue: None, normal,Extremity Movements Upper (arms, wrists, hands, fingers): None, normal Lower (legs,  knees, ankles, toes): None, normal, Trunk Movements Neck, shoulders, hips: None, normal, Overall Severity Severity of abnormal movements (highest score from questions above): None, normal Incapacitation due to abnormal movements: None, normal Patient's awareness of abnormal movements (rate only patient's report): No Awareness, Dental Status Current problems with teeth and/or dentures?: No Does patient usually wear dentures?: No  CIWA:    COWS:     Musculoskeletal: Strength & Muscle Tone: within normal limits Gait & Station: unsteady patient use walker/wheelchaire for ambulation assistances Patient leans: N/A  Psychiatric Specialty Exam: Review of Systems  Constitutional: Negative.   HENT: Negative.   Eyes: Negative.   Musculoskeletal: Positive for back pain.       Leg pain 10/10  Skin: Negative.   Neurological: Positive for tingling.  Psychiatric/Behavioral: Positive for depression. The patient is nervous/anxious and has insomnia.   All other systems reviewed and are negative.   Blood pressure 117/54, pulse 95, temperature 97.8 F (36.6 C), temperature source Oral, resp. rate 16, height 5' 3"  (1.6 m), weight 196 lb (88.905 kg), SpO2 100 %.Body mass index is 34.73 kg/(m^2).  General Appearance: Fairly Groomed  Engineer, water::  Improved   Speech:  Clear and Coherent  Volume:  Normal   Mood:  Depressed , irritable   Affect:   Labile, irritable  Thought Process:   Linear   Orientation:  Full (Time, Place, and Person)  Thought Content:  Focused on and ruminative about pain issues, denies hallucinations and does not appear internally preoccupied , no delusions expressed   Suicidal Thoughts:  Passive thoughts of death , dying, but denies plan or intention of hurting self or of suicide at this time  Homicidal Thoughts:  No  Memory:  Immediate;   Fair Recent;   Fair Remote;   Fair  Judgement:  Fair  Insight:  Fair  Psychomotor Activity:   Slightly restless, rocking at times    Concentration:  Fair  Recall:  Good  Fund of Knowledge:Good  Language: Good  Akathisia:  No  Handed:  Right  AIMS (if indicated):     Assets:  Communication Skills Desire for Improvement Social Support  ADL's:  Intact  Cognition: WNL  Sleep:  Number of Hours: 1    Assessment - patient remains depressed,  Irritable, constricted in affect, somatically focused and preoccupied, with passive thoughts of death, but denying any plan or intention of hurting self at this time. She remains isolated in room most of the time. [name] focused on chronic pain , physical discomfort, which she reports is worsening her depression.  Currently on Cymbalta, being titrated as Zoloft dose is  decreased. States she does not think this medication helping at this time, but is not endorsing medication side effects.  Treatment Plan Summary:  Daily contact with patient to assess and evaluate symptoms and progress in treatment and Medication management  Encourage increased milieu, group participation, in order to work on coping skills and symptom reduction.  Decrease ZOLOFT further to 50  mgrs QDAY for depression.  Increase CYMBALTA further to 30 mgrs QDAY for depression Continue NEURONTIN to 600 mgrs TID for anxiety and  pain Continue ULTRAM to 100 mgrs Q 8 hours PRN severe pain.  Patient denies side effect Continue ATIVAN 0.5 mgrs Q 8 hours PRN for anxiety, as needed  I have discussed ECT with patient as a possible option to treat severe depression that does not respond well to medications- she expresses interest in this treatment modality. - will discuss with staff- recommend evaluation by Dr. Evans Lance at  [facility] to determine if she is an appropriate candidate .  Will recheck BMP and CBC in AM, to monitor   PT consult pending  Have requested re-assessment by Hospitalist to help assess best pain management strategy while in hospital.   Neita Garnet , MD   06/20/2015, 3:05 PM

## 2015-06-21 DIAGNOSIS — F332 Major depressive disorder, recurrent severe without psychotic features: Secondary | ICD-10-CM | POA: Insufficient documentation

## 2015-06-21 LAB — URIC ACID: Uric Acid, Serum: 7 mg/dL — ABNORMAL HIGH (ref 2.3–6.6)

## 2015-06-21 LAB — CBC WITH DIFFERENTIAL/PLATELET
BASOS ABS: 0.1 10*3/uL (ref 0.0–0.1)
BASOS PCT: 1 %
Eosinophils Absolute: 0.2 10*3/uL (ref 0.0–0.7)
Eosinophils Relative: 2 %
HEMATOCRIT: 43.3 % (ref 36.0–46.0)
HEMOGLOBIN: 13.7 g/dL (ref 12.0–15.0)
LYMPHS PCT: 48 %
Lymphs Abs: 4.6 10*3/uL — ABNORMAL HIGH (ref 0.7–4.0)
MCH: 30 pg (ref 26.0–34.0)
MCHC: 31.6 g/dL (ref 30.0–36.0)
MCV: 95 fL (ref 78.0–100.0)
Monocytes Absolute: 0.8 10*3/uL (ref 0.1–1.0)
Monocytes Relative: 8 %
NEUTROS ABS: 3.9 10*3/uL (ref 1.7–7.7)
NEUTROS PCT: 41 %
Platelets: 354 10*3/uL (ref 150–400)
RBC: 4.56 MIL/uL (ref 3.87–5.11)
RDW: 13.5 % (ref 11.5–15.5)
WBC: 9.5 10*3/uL (ref 4.0–10.5)

## 2015-06-21 LAB — BASIC METABOLIC PANEL
ANION GAP: 13 (ref 5–15)
BUN: 22 mg/dL — ABNORMAL HIGH (ref 6–20)
CALCIUM: 10 mg/dL (ref 8.9–10.3)
CO2: 28 mmol/L (ref 22–32)
Chloride: 95 mmol/L — ABNORMAL LOW (ref 101–111)
Creatinine, Ser: 0.97 mg/dL (ref 0.44–1.00)
GFR, EST NON AFRICAN AMERICAN: 57 mL/min — AB (ref >60)
GLUCOSE: 107 mg/dL — AB (ref 65–99)
POTASSIUM: 4 mmol/L (ref 3.5–5.1)
Sodium: 136 mmol/L (ref 135–145)

## 2015-06-21 MED ORDER — TRAMADOL HCL 50 MG PO TABS
50.0000 mg | ORAL_TABLET | Freq: Two times a day (BID) | ORAL | Status: DC | PRN
  Administered 2015-06-22 – 2015-06-23 (×3): 50 mg via ORAL
  Filled 2015-06-21 (×4): qty 1

## 2015-06-21 MED ORDER — DULOXETINE HCL 20 MG PO CPEP
40.0000 mg | ORAL_CAPSULE | Freq: Every day | ORAL | Status: AC
  Administered 2015-06-22: 40 mg via ORAL
  Filled 2015-06-21: qty 2

## 2015-06-21 MED ORDER — GABAPENTIN 300 MG PO CAPS
600.0000 mg | ORAL_CAPSULE | Freq: Three times a day (TID) | ORAL | Status: DC
  Administered 2015-06-21 – 2015-06-22 (×3): 600 mg via ORAL
  Filled 2015-06-21 (×5): qty 2

## 2015-06-21 MED ORDER — HYDROCODONE-ACETAMINOPHEN 5-325 MG PO TABS
1.0000 | ORAL_TABLET | ORAL | Status: DC | PRN
  Administered 2015-06-21 – 2015-06-26 (×27): 1 via ORAL
  Filled 2015-06-21 (×27): qty 1

## 2015-06-21 MED ORDER — GABAPENTIN 300 MG PO CAPS
600.0000 mg | ORAL_CAPSULE | Freq: Four times a day (QID) | ORAL | Status: DC
  Filled 2015-06-21 (×7): qty 2

## 2015-06-21 NOTE — Progress Notes (Signed)
D-  Patient has been bed ridden the entire shift.  Patient was encouraged OOB but refused all assistance and remained in bed.  Patient had several somatic complaints and complained of pain throughout the shift.  Patient was offered pain medication as prescribed, but refused to take any medication for pain relief other than vicodin.  Patient presented with and irritable affect.   A- assess patient for safety, offer medication as prescribed, encourage patient OOB.   R- Patient remained in bed for the entire shift. Patient remained irritable.

## 2015-06-21 NOTE — Plan of Care (Signed)
Problem: Ineffective individual coping Goal: STG: Patient will remain free from self harm Outcome: Progressing Pt is safe and free from self harm     

## 2015-06-21 NOTE — Progress Notes (Signed)
PT Cancellation Note  Patient Details Name: [name] MRN: [id number] DOB: 21-Oct-1940   Cancelled Treatment:    Pt declined PT services today stating, "I told them to call you and tell you not to come. I am sorry you made a trip over here". Pt states she is in too much pain to get up now and that she is going home either today or tomorrow. We talked about how weak she is getting in the bed and how she needs to get up with PT in order to make her stronger and increase her safety with walking. She continued to have a negative answer for every encouragement and explanation of how she really needs to work with PT. She did agree to [place] and stated she had [facility] in the past.   Please order and set up [place] [facility] to come to her house once pt is discharged.   Thanks    Clide Dales 06/21/2015, 5:10 PM  Clide Dales, PT Pager: [phone] 06/21/2015

## 2015-06-21 NOTE — Progress Notes (Addendum)
Patient ID: TUCKER STEEDLEY, female   DOB: [birth year], 75 y.o.   MRN: [id number] [facility] MD Progress Note  06/21/2015 1:07 PM AMARE KONTOS  MRN:  [id number]   Subjective: Patient reports partial  improvement and states she is hoping to discharge soon. She continues to report chronic pain as a major issue, but does acknowledge that her pain has decreased partially with vicodin management. At this time not endorsing medication side effects    Objective:  I have discussed case with treatment team and have met with patient. Patient reports feeling " better", and today more focused on being discharged soon. Plans to return home and continue outpatient treatment . Yesterday we had discussed as a possible treatment option for ongoing depression. Today states  " I don't think I need that type of treatment at this point   ".  We discussed recommendation of establishing treatment at a pain clinic , in order to continue addressing chronic pain, which has been a contributing factor to depression and decreased quality of life .  She continues to present irritable, reporting dissatisfaction with care , food/ fluids offered, and management of pain ,  stating " nobody seems to understand or care about  the level of pain I have been dealing with ".  However, today she is more amenable to support, encouragement, and affect did seem to improve partially during visit . Limited participation in milieu/ groups, spending most time in room. States she does not feel groups would help.  Appreciate Hospitalist  Consultation - patient denies side effects on Vicodin and states pain has subsided partially. Of note, BUN and Creatinine significantly improved and currently within normal, and GFR  Now 57. Patient has provided consent for CSW to contact her adult children, in order to work on disposition planning .  Denies any suicidal ideations, denies any psychotic symptoms. Does not appear internally preoccupied .    Principal  Problem: MDD (major depressive disorder), recurrent episode, severe ([place]) Diagnosis:   Patient Active Problem List   Diagnosis Date Noted  . Back pain [M54.9] 06/20/2015  . MDD (major depressive disorder), recurrent episode, severe ([place]) [F33.2] 06/16/2015  . Lumbar stenosis [M48.06] 04/19/2015  . Hypokalemia [E87.6] 09/19/2014  . Constipation [K59.00] 09/19/2014  . Acute encephalopathy [G93.40] 09/18/2014  . Acute respiratory failure ([place]) [J96.00] 09/17/2014  . OSA (obstructive sleep apnea) [G47.33] 09/17/2014  . Ankle fracture, lateral malleolus, closed [S82.63XA] 09/14/2014  . Major depressive disorder, recurrent severe without psychotic features ([place]) [F33.2] 08/07/2014  . Insomnia [G47.00] 08/05/2014  . Hip joint painful on movement [M25.559] 08/05/2014  . Degenerative disc disease, lumbar [M51.36] 08/05/2014  . Severe obesity (BMI >= 40) ([place]) [E66.01] 01/11/2014  . Hyperlipemia [E78.5]   . Depression [F32.9]   . Arthritis [M19.90]   . Polyneuropathy in other diseases classified elsewhere ([place]) [G63] 04/08/2013  . Restless legs syndrome (RLS) [G25.81] 04/08/2013  . UTI (lower urinary tract infection) [N39.0] 10/14/2011  . Dizziness [R42] 10/12/2011  . Fall [W19.XXXA] 10/12/2011  . TIA (transient ischemic attack) [G45.9] 10/12/2011  . Cellulitis [L03.90] 10/12/2011  . HTN (hypertension) [I10] 10/12/2011  . GERD (gastroesophageal reflux disease) [K21.9] 10/12/2011   Total Time spent with patient:  25 minutes   Past Psychiatric History: SEE ABOVE   Past Medical History:  Past Medical History  Diagnosis Date  . GERD (gastroesophageal reflux disease)     hx pud '98  . Arthritis     djd  . Restless leg syndrome  takes Sinemet daily  . 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 Dysrhythmia     hx tachy palpitations  . Neuromuscular disorder ([place])     peripheral neuropathy, FEET AND LEGS  . Stroke [facility])     ? 6 months ago - tests okay - [facility]   .  Cellulitis     10/11/11 hospitalized for cellulitis   . Hyperlipemia     takes Atorvastatin daily  . Peripheral neuropathy ([place])   . Elbow fracture, left April 2015  . Depression     takes Zoloft daily  . Vertigo     takes Meclizine daily as needed  . Insomnia     takes restoril nightly as needed  . Hypertension     takes HCTZ daily  . Peripheral neuropathy ([facility])     takes Gabapentin daily  . History of bronchitis     many yrs ago  . Headache(784.0)     migraines yrs ago-takes Imitrex daily  . Joint pain   . Joint swelling   . Chronic back pain     scoliosis/stenosis  . Anxiety     takes Ativan daily as needed  . Cataracts, bilateral     immature   . Depression     Past Surgical History  Procedure Laterality Date  . Cervical disc surgery x2  2011  . Rt carotid enarterectomy  2011  . Rt knee arthroscopy  '99  . Left knee arthroscopy  2001  . Hematoma evacuation  2011    s/p rt cea  . Joint replacement  '01    total knee replacement, LEFT  . Abdominal hysterectomy  1975    bil oophorectomy  . Back surgery   MANY YRS AGO    laminectomy x3  . Cholecystectomy  1993  . Total knee arthroplasty  09/13/2011    Procedure: TOTAL KNEE ARTHROPLASTY;  Surgeon: Magnus Sinning, MD;  Location: [facility];  Service: Orthopedics;  Laterality: Right;  . Right knee replacement      09/2011   . Carpal tunnel release  07/09/2012    Procedure: CARPAL TUNNEL RELEASE;  Surgeon: Magnus Sinning, MD;  Location: [facility];  Service: Orthopedics;  Laterality: Left;  . Finger arthroplasty  07/09/2012    Procedure: FINGER ARTHROPLASTY;  Surgeon: Magnus Sinning, MD;  Location: [facility];  Service: Orthopedics;  Laterality: Left;  Interposition Arthroplasty CMC Joint Thumb Left   . Lipoma excision  07/09/2012    Procedure: EXCISION LIPOMA;  Surgeon: Magnus Sinning, MD;  Location: [facility];  Service: Orthopedics;  Laterality: Left;  Excision Lipoma Dorsum Left Wrist   . Orif ankle fracture Right 09/14/2014     FIBULA   . Orif ankle fracture Right 09/14/2014    Procedure: OPEN REDUCTION INTERNAL FIXATION (ORIF) RIGHT ANKLE FRACTURE/SYNDESMOSIS ;  Surgeon: Wylene Simmer, MD;  Location: [place];  Service: Orthopedics;  Laterality: Right;  . Colonosocpy    . Esophagogastroduodenoscopy    . Lumbar laminectomy with coflex 1 level Bilateral 04/19/2015    Procedure: LUMBAR TWO-THREE LUMBAR LAMINECTOMY WITH COFLEX ;  Surgeon: Kristeen Miss, MD;  Location: [place] [facility];  Service: Neurosurgery;  Laterality: Bilateral;  Bilateral L23 laminectomy and foraminotomy with coflex   Family History:  Family History  Problem Relation Age of Onset  . Congestive Heart Failure Father   . Congestive Heart Failure Sister   . Alzheimer's disease Mother   . Diabetes Brother   .  Arthritis Brother    Family Psychiatric  History: SEE ABOVE Social History:  History  Alcohol Use No     History  Drug Use No    Social History   Social History  . Marital Status: Widowed    Spouse Name: N/A  . Number of Children: 2  . Years of Education: 24   Social History Main Topics  . Smoking status: Never Smoker   . Smokeless tobacco: Never Used  . Alcohol Use: No  . Drug Use: No  . Sexual Activity: Yes    Birth Control/ Protection: Surgical   Other Topics Concern  . None   Social History Narrative   Patient is widowed and lives alone.   Patient has two sons.   Patient is retired.   Patient has a college education.   Patient is right-handed.   Patient drinks three glasses of Coke-soda daily.         Additional Social History:   Sleep:  Poor   Appetite:   Fair   Current Medications: Current Facility-Administered Medications  Medication Dose Route Frequency Provider Last Rate Last Dose  . acetaminophen (TYLENOL) tablet 650 mg  650 mg Oral Q6H PRN Delfin Gant, NP   650 mg at 06/17/15 2259  . aspirin tablet 325 mg  325 mg Oral Daily Delfin Gant, NP   325 mg at 06/21/15 0856  . atorvastatin  (LIPITOR) tablet 40 mg  40 mg Oral Daily Delfin Gant, NP   40 mg at 06/21/15 0856  . calcium carbonate (OS-CAL - dosed in mg of elemental calcium) tablet 500 mg of elemental calcium  1 tablet Oral Q breakfast Delfin Gant, NP   500 mg of elemental calcium at 06/21/15 0857  . diclofenac (FLECTOR) 1.3 % 1 patch  1 patch Transdermal BID Caren Griffins, MD   1 patch at 06/20/15 1824  . doxycycline (VIBRA-TABS) tablet 100 mg  100 mg Oral Q12H Theodis Blaze, MD   100 mg at 06/21/15 0856  . feeding supplement (ENSURE ENLIVE) (ENSURE ENLIVE) liquid 237 mL  237 mL Oral Q24H Maricela Bo Ost[place], RD   237 mL at 06/21/15 1048  . gabapentin (NEURONTIN) capsule 600 mg  600 mg Oral TID Jenne Campus, MD   600 mg at 06/21/15 0856  . HYDROcodone-acetaminophen (NORCO/VICODIN) 5-325 MG per tablet 1 tablet  1 tablet Oral Q6H PRN Caren Griffins, MD   1 tablet at 06/21/15 0948  . lidocaine (LIDODERM) 5 % 1 patch  1 patch Transdermal Daily Delfin Gant, NP   1 patch at 06/21/15 0857  . LORazepam (ATIVAN) tablet 0.5 mg  0.5 mg Oral Q8H PRN Kerrie Buffalo, NP   0.5 mg at 06/21/15 0339  . meclizine (ANTIVERT) tablet 25 mg  25 mg Oral Daily PRN Delfin Gant, NP      . ondansetron (ZOFRAN-ODT) disintegrating tablet 4 mg  4 mg Oral Q8H PRN Jenne Campus, MD   4 mg at 06/20/15 1340  . potassium chloride (K-DUR,KLOR-CON) CR tablet 10 mEq  10 mEq Oral Daily Theodis Blaze, MD   10 mEq at 06/21/15 0856  . pyridOXINE (VITAMIN B-6) tablet 50 mg  50 mg Oral Daily Delfin Gant, NP   50 mg at 06/21/15 0856  . sertraline (ZOLOFT) tablet 50 mg  50 mg Oral Daily Jenne Campus, MD   50 mg at 06/21/15 0856  . SUMAtriptan (IMITREX) tablet 50 mg  50 mg  Oral Q2H PRN Delfin Gant, NP   50 mg at 06/21/15 0011  . temazepam (RESTORIL) capsule 7.5 mg  7.5 mg Oral QHS PRN Harriet Butte, NP   7.5 mg at 06/20/15 2128  . traMADol (ULTRAM) tablet 100 mg  100 mg Oral Q8H PRN Jenne Campus, MD   100 mg at  06/21/15 2376    Lab Results:  Results for orders placed or performed during the hospital encounter of 06/16/15 (from the past 48 hour(s))  Basic metabolic panel     Status: Abnormal   Collection Time: 06/21/15  6:30 AM  Result Value Ref Range   Sodium 136 135 - 145 mmol/L   Potassium 4.0 3.5 - 5.1 mmol/L   Chloride 95 (L) 101 - 111 mmol/L   CO2 28 22 - 32 mmol/L   Glucose, Bld 107 (H) 65 - 99 mg/dL   BUN 22 (H) 6 - 20 mg/dL   Creatinine, Ser 0.97 0.44 - 1.00 mg/dL   Calcium 10.0 8.9 - 10.3 mg/dL   GFR calc non Af Amer 57 (L) >60 mL/min   GFR calc Af Amer >60 >60 mL/min    Comment: (NOTE) The eGFR has been calculated using the CKD EPI equation. This calculation has not been validated in all clinical situations. eGFR's persistently <60 mL/min signify possible Chronic Kidney Disease.    Anion gap 13 5 - 15    Comment: Performed at [facility]  CBC with Differential/Platelet     Status: Abnormal   Collection Time: 06/21/15  6:30 AM  Result Value Ref Range   WBC 9.5 4.0 - 10.5 K/uL   RBC 4.56 3.87 - 5.11 MIL/uL   Hemoglobin 13.7 12.0 - 15.0 g/dL   HCT 43.3 36.0 - 46.0 %   MCV 95.0 78.0 - 100.0 fL   MCH 30.0 26.0 - 34.0 pg   MCHC 31.6 30.0 - 36.0 g/dL   RDW 13.5 11.5 - 15.5 %   Platelets 354 150 - 400 K/uL   Neutrophils Relative % 41 %   Neutro Abs 3.9 1.7 - 7.7 K/uL   Lymphocytes Relative 48 %   Lymphs Abs 4.6 (H) 0.7 - 4.0 K/uL   Monocytes Relative 8 %   Monocytes Absolute 0.8 0.1 - 1.0 K/uL   Eosinophils Relative 2 %   Eosinophils Absolute 0.2 0.0 - 0.7 K/uL   Basophils Relative 1 %   Basophils Absolute 0.1 0.0 - 0.1 K/uL    Comment: Performed at [facility]    Physical Findings: AIMS: Facial and Oral Movements Muscles of Facial Expression: None, normal Lips and Perioral Area: None, normal Jaw: None, normal Tongue: None, normal,Extremity Movements Upper (arms, wrists, hands, fingers): None, normal Lower (legs, knees,  ankles, toes): None, normal, Trunk Movements Neck, shoulders, hips: None, normal, Overall Severity Severity of abnormal movements (highest score from questions above): None, normal Incapacitation due to abnormal movements: None, normal Patient's awareness of abnormal movements (rate only patient's report): No Awareness, Dental Status Current problems with teeth and/or dentures?: No Does patient usually wear dentures?: No  CIWA:    COWS:     Musculoskeletal: Strength & Muscle Tone: within normal limits Gait & Station: unsteady patient use walker/wheelchaire for ambulation assistances Patient leans: N/A  Psychiatric Specialty Exam: Review of Systems  Constitutional: Negative.   HENT: Negative.   Eyes: Negative.   Musculoskeletal: Positive for back pain.       Leg pain 10/10  Skin: Negative.  Neurological: Positive for tingling.  Psychiatric/Behavioral: Positive for depression. The patient is nervous/anxious and has insomnia.   All other systems reviewed and are negative.   Blood pressure 127/60, pulse 100, temperature 97.9 F (36.6 C), temperature source Oral, resp. rate 16, height 5' 3"  (1.6 m), weight 196 lb (88.905 kg), SpO2 100 %.Body mass index is 34.73 kg/(m^2).  General Appearance: Fairly Groomed  Engineer, water::  Improved   Speech:  Clear and Coherent  Volume:  Normal   Mood:   Mood less depressed , remains irritable   Affect:    Irritable but somewhat more reactive today  Thought Process:   Linear   Orientation:  Full (Time, Place, and Person)  Thought Content:  denies hallucinations and does not appear internally preoccupied , no delusions expressed   Suicidal Thoughts:  Today denies plan or intention of hurting self or of suicide  Homicidal Thoughts:  No  Memory:  Recent and remote grossly intact   Judgement:  Improving   Insight:  Fair  Psychomotor Activity:   No restlessness noted at this time  Concentration:  Good  Recall:  Good  Fund of Knowledge:Good   Language: Good  Akathisia:  No  Handed:  Right  AIMS (if indicated):     Assets:  Communication Skills Desire for Improvement Social Support  ADL's:  Intact  Cognition: WNL  Sleep:  Number of Hours: 2.25    Assessment - patient reports partial improvement in mood and is starting to focus more on discharge planning. She remains irritable, critical of treatment , but today does present partially more reactive to support, empathy.  BUN/Creatininne/GFR have improved, normalized . As patient reports that  higher Neurontin dose was helping her pain , and kidney function tests now improved, normal, will titrate Gabapentin dose up to QID dosing , as requested . Patient currently in process of tapering off Zoloft and titrating Cymbalta, which she has tolerated well thus far . Increase Cymbalta to 40 mgrs QDAY and D/C Zoloft . She has tolerated Vicodin well thus far. Agrees to taper off Ultram as Vicodin  helpful.  Appreciate Hospitalist follow up/ consultative care.   Treatment Plan Summary:  Daily contact with patient to assess and evaluate symptoms and progress in treatment and Medication management  Encourage increased milieu, group participation, in order to work on coping skills and symptom reduction.  D/C  ZOLOFT  Increase CYMBALTA further to 40  mgrs QDAY for depression/ pain  Increase  NEURONTIN to 600 mgrs QID  for anxiety and pain Decrease ULTRAM to 50  mgrs Q 12 hours PRN severe pain.  Patient denies side effect Continue ATIVAN 0.5 mgrs Q 8 hours PRN for anxiety, as needed D/C DICLOFENAC  To minimize potential for drug drug interactions such as GI bleed as also on antidepressant and ASA. Patient agrees .   Treatment Team working on disposition planning- patient interested in returning home - encouraged to consider pain clinic evaluation, management to continue treatment of chronic pain.   Neita Garnet , MD   06/21/2015, 1:07 PM

## 2015-06-21 NOTE — Progress Notes (Signed)
Patient ID: [name], female   DOB: 06-28-1940, 75 y.o.   MRN: [id number] Pt called to NS requesting pain medication. Tramadol 100 mg taken to pt's room. Pt rated pain as 10 on a 0-10 scale. Writer told pain the medication she will be taken. Pt refused stating she want Vicodin instead. Pt notified Vicodin was not due for another hour. Pt reports willing to wait for medication when due.

## 2015-06-21 NOTE — Progress Notes (Addendum)
[facility] PROGRESS NOTE  [name] [id number] DOB: [birth year] DOA: 06/16/2015 PCP: Hollace Kinnier, DO  Length of stay: 5   Assessment/Plan: Principal Problem:   MDD (major depressive disorder), recurrent episode, severe ([place]) Active Problems:   Back pain    HPI:  Pt admitted to [facility] as per her son with FTT and progressive decline. TRH consulted due to multiple electrolyte issues and ? UTI management. Pt not providing much history, very depressed.   Patient is complaining of neuropathic foot pain as well as severe low back pain. 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 She has been doing well post op however recently has been having worsening of her back pain. This is the main issue that makes her this depressed.   Review of Systems:  She denies fevers or chills, denies dysuria, denies chest pain / dyspnea. In addition, 10 points ROS negative  Impression/Recommendations Principal Problem:  MDD (major depressive disorder), recurrent episode, severe ([facility]) Active Problems:  Back pain   1. Back pain - seems to be a major issue and contributes to her current depression. Continue diclofenac patch.  Norco/tramadol. Patient could benefit from PT/OT assessment, check uric acid. History of cervical fusion from C5-C7. Patient may benefit from MRI of cervical and lumbar spine after discharge from behavioral health with follow-up with Dr. Kristeen Miss  2. Foot pain - neuropathic, already on gabapentin, limited dosing due to renal function. Continue. Norco will help 3. Recent UTI - antibiotics as per original plan, urine culture without specific growth, currently on doxycycline, complete 5 days 4. Dyslipidemia, continue statin and repeat lipid panel in 1 month 5. Hypokalemia-resolved 6. . Leukocytosis secondary to UTI, resolved 7. Prediabetes-normal glucose  and A1c 6.1,  needs to be repeated in 3 months 8. Hypertension -blood pressure soft, will continue to hold all antihypertensive medications and resume low dose lisinopril at 5 mg a day. Blood pressure is greater than 120   DVT prophylaxsis  ambulation  Code Status:      Code Status Orders        Start     Ordered   06/16/15 1639  Full code   Continuous     06/16/15 1638       Family Communication: Discussed in detail with the patient, all imaging results, lab results explained to the patient   Disposition Plan:  As above       Consultants:  Internal medicine  Procedures:  None  Antibiotics: Anti-infectives    Start     Dose/Rate Route Frequency Ordered Stop   06/17/15 2230  doxycycline (VIBRA-TABS) tablet 100 mg     100 mg Oral Every 12 hours 06/17/15 2149           HPI/Subjective: Afebrile overnight, blood pressure still soft,  Objective: Filed Vitals:   06/18/15 2253 06/19/15 0926 06/20/15 0839 06/21/15 0821  BP: 155/84 100/53 117/54 127/60  Pulse: 109 94 95 100  Temp: 98.7 F (37.1 C) 97.5 F (36.4 C) 97.8 F (36.6 C) 97.9 F (36.6 C)  TempSrc: Oral Oral Oral Oral  Resp:  18 16 16   Height:      Weight:      SpO2: 100%      No intake or output data in the 24 hours ending 06/21/15 1330  Exam:  General: No acute respiratory distress Lungs: Clear to auscultation bilaterally without wheezes or crackles  Cardiovascular: Regular rate and rhythm without murmur gallop or rub normal S1 and S2 Abdomen: Nontender, nondistended, soft, bowel sounds positive, no rebound, no ascites, no appreciable mass Extremities: No significant cyanosis, clubbing, or edema bilateral lower extremities     Data Review   Micro Results Recent Results (from the past 240 hour(s))  Urine culture     Status: None   Collection Time: 06/18/15  6:10 PM  Result Value Ref Range Status   Specimen Description   Final    URINE, RANDOM Performed at [place] Requests   Final    NONE Performed at [facility]    Culture   Final    MULTIPLE SPECIES PRESENT, SUGGEST RECOLLECTION Performed at [facility]    Report Status 06/20/2015 FINAL  Final    Radiology Reports No results found.   CBC  Recent Labs Lab 06/15/15 1331 06/18/15 0726 06/21/15 0630  WBC 13.2* 8.6 9.5  HGB 15.0 13.0 13.7  HCT 48.3* 40.5 43.3  PLT 414* 278 354  MCV 97.4 93.8 95.0  MCH 30.2 30.1 30.0  MCHC 31.1 32.1 31.6  RDW 13.8 13.5 13.5  LYMPHSABS 3.1  --  4.6*  MONOABS 0.9  --  0.8  EOSABS 0.2  --  0.2  BASOSABS 0.0  --  0.1    Chemistries   Recent Labs Lab 06/15/15 1331 06/18/15 0726 06/21/15 0630  NA 137 137 136  K 3.2* 3.7 4.0  CL 92* 95* 95*  CO2 26 28 28   GLUCOSE 166* 107* 107*  BUN 15 28* 22*  CREATININE 1.47* 1.29* 0.97  CALCIUM 10.3 9.6 10.0  MG  --  2.0  --   AST 33  --   --   ALT <5*  --   --   ALKPHOS 107  --   --   BILITOT 0.8  --   --    ------------------------------------------------------------------------------------------------------------------ estimated creatinine clearance is 54.6 mL/min (by C-G formula based on Cr of 0.97). ------------------------------------------------------------------------------------------------------------------ No results for input(s): HGBA1C in the last 72 hours. ------------------------------------------------------------------------------------------------------------------ No results for input(s): CHOL, HDL, LDLCALC, TRIG, CHOLHDL, LDLDIRECT in the last 72 hours. ------------------------------------------------------------------------------------------------------------------ No results for input(s): TSH, T4TOTAL, T3FREE, THYROIDAB in the last 72 hours.  Invalid input(s): FREET3 ------------------------------------------------------------------------------------------------------------------ No results for input(s): VITAMINB12, FOLATE, FERRITIN, TIBC, IRON,  RETICCTPCT in the last 72 hours.  Coagulation profile No results for input(s): INR, PROTIME in the last 168 hours.  No results for input(s): DDIMER in the last 72 hours.  Cardiac Enzymes No results for input(s): CKMB, TROPONINI, MYOGLOBIN in the last 168 hours.  Invalid input(s): CK ------------------------------------------------------------------------------------------------------------------ Invalid input(s): POCBNP   CBG: No results for input(s): GLUCAP in the last 168 hours.     Studies: No results found.    Lab Results  Component Value Date   HGBA1C 6.1* 06/18/2015   HGBA1C 6.6* 04/20/2015   HGBA1C 7.1* 11/11/2014   Lab Results  Component Value Date   LDLCALC 137* 06/18/2015   CREATININE 0.97 06/21/2015       Scheduled Meds: . aspirin  325 mg Oral Daily  . atorvastatin  40 mg Oral Daily  . calcium carbonate  1 tablet Oral Q breakfast  . diclofenac  1 patch Transdermal BID  . doxycycline  100 mg Oral Q12H  . feeding supplement (ENSURE ENLIVE)  237 mL Oral Q24H  . gabapentin  600 mg Oral TID  . lidocaine  1 patch Transdermal Daily  .  potassium chloride  10 mEq Oral Daily  . pyridOXINE  50 mg Oral Daily  . sertraline  50 mg Oral Daily   Continuous Infusions:   Principal Problem:   MDD (major depressive disorder), recurrent episode, severe ([facility]) Active Problems:   Back pain    Time spent: 45 minutes   [place] [facility] Pager [phone]. If 7PM-7AM, please contact night-coverage at [email or web address], password [facility] 06/21/2015, 1:30 PM  LOS: 5 days

## 2015-06-21 NOTE — [facility] Group Notes (Signed)
Adult Psychoeducational Group Note  Date:  06/21/2015 Time:  11:07 PM  Group Topic/Focus:  Wrap-Up Group:   The focus of this group is to help patients review their daily goal of treatment and discuss progress on daily workbooks.  Participation Level:  Did Not Attend  Participation Quality:  Did Not Attend  Affect:  Did Not Attend  Cognitive:  Did Not Attend  Insight: None  Engagement in Group:  Did Not Attend  Modes of Intervention:  Did Not Attend  Additional Comments:  Did Not Attend  Noel Christmas 06/21/2015, 11:07 PM

## 2015-06-21 NOTE — Progress Notes (Signed)
Recreation Therapy Notes  Animal-Assisted Activity (AAA) Program Checklist/Progress Notes Patient Eligibility Criteria Checklist & Daily Group note for Rec Tx Intervention  Date: 01.17.2017 Time: 2:45pm Location: [address]    AAA/T Program Assumption of Risk Form signed by Patient/ or Parent Legal Guardian yes  Patient is free of allergies or sever asthma yes  Patient reports no fear of animals yes  Patient reports no history of cruelty to animals yes  Patient understands his/her participation is voluntary yes  Behavioral Response: Did not attend   [employer], LRT/CTRS  Lane Hacker 06/21/2015 2:59 PM

## 2015-06-21 NOTE — BHH Group Notes (Signed)
[place] LCSW Group Therapy 06/21/2015 1:15 PM  Type of Therapy: Group Therapy- Feelings about Diagnosis  Pt did not attend, declined invitation.   Peri Maris, [place] 06/21/2015 4:07 PM

## 2015-06-21 NOTE — BHH Suicide Risk Assessment (Signed)
[place] INPATIENT:  Family/Significant Other Suicide Prevention Education  Suicide Prevention Education:  Education Completed; [name], Pt's son [phone], has been identified by the patient as the family member/significant other with whom the patient will be residing, and identified as the person(s) who will aid the patient in the event of a mental health crisis (suicidal ideations/suicide attempt).  With written consent from the patient, the family member/significant other has been provided the following suicide prevention education, prior to the and/or following the discharge of the patient.  The suicide prevention education provided includes the following:  Suicide risk factors  Suicide prevention and interventions  National Suicide Hotline telephone number  [facility] assessment telephone number  [facility] Emergency Assistance [place] and/or Residential Mobile Crisis Unit telephone number  Request made of family/significant other to:  Remove weapons (e.g., guns, rifles, knives), all items previously/currently identified as safety concern.    Remove drugs/medications (over-the-counter, prescriptions, illicit drugs), all items previously/currently identified as a safety concern.  The family member/significant other verbalizes understanding of the suicide prevention education information provided.  The family member/significant other agrees to remove the items of safety concern listed above.  Bo Mcclintock 06/21/2015, 4:08 PM

## 2015-06-22 MED ORDER — DULOXETINE HCL 60 MG PO CPEP
60.0000 mg | ORAL_CAPSULE | Freq: Every day | ORAL | Status: DC
  Administered 2015-06-23 – 2015-06-28 (×6): 60 mg via ORAL
  Filled 2015-06-22 (×8): qty 1

## 2015-06-22 MED ORDER — LORAZEPAM 0.5 MG PO TABS
0.5000 mg | ORAL_TABLET | Freq: Two times a day (BID) | ORAL | Status: DC | PRN
  Administered 2015-06-22 – 2015-06-24 (×4): 0.5 mg via ORAL
  Filled 2015-06-22 (×4): qty 1

## 2015-06-22 MED ORDER — GABAPENTIN 300 MG PO CAPS
600.0000 mg | ORAL_CAPSULE | Freq: Four times a day (QID) | ORAL | Status: DC
  Administered 2015-06-22 – 2015-06-28 (×24): 600 mg via ORAL
  Filled 2015-06-22 (×20): qty 2
  Filled 2015-06-22: qty 6
  Filled 2015-06-22 (×12): qty 2

## 2015-06-22 NOTE — Progress Notes (Signed)
Patient ID: BRANNON DECAIRE, female   DOB: 1941/02/23, 75 y.o.   MRN: [id number] [facility] MD Progress Note  06/22/2015 12:28 PM LYNORA DYMOND  MRN:  [id number]   Subjective: Patient continues to focus on pain, states pain is severe, and feels it has become worse since her Neurontin dose was decreased . She denies medication side effects at this time .    Objective:  I have discussed case with treatment team and have met with patient. Patient seen with RN and CSW- although still presenting with irritability, anger, she presents with some improvement . Today states she realizes she is angry, irritable, and that she has not been participating in milieu activities. States " it is because I feel nobody really cares " and because of her pain. [name] more responsive to support, encouragement today, and although has refused to get out of bed earlier, did seem more amenable to being out on the unit in wheel chair today, and to take a bath with Nursing assistance. Patient had expressed desire for discharge yesterday. Today less focused on this and stated she realizes she " is not doing well ". CSW has spoken with sons via phone and report is that they have concerns about discharge due to non compliance with treatments/ therapy ( even prior to admission ) and decreased functional level . In addition, although patient denies suicidal plan or intention, voices passive SI, stating ( when we discussed concerns as above ) that " maybe just dying would be OK too ". Denies hallucinations, and does not appear internally preoccupied . Although reporting ongoing chronic pain, does state that Vicodin helps , at least partially. He major focus regarding medications relates to increasing Neurontin back to 600 mgrs QID which she was taking prior to admission- states that lower dose not helpful. Thus far tolerating Cymbalta trial well . Staff discussed possibility that patient may have early stages of dementia. Patient denies  memory issues , and at this time presents fully oriented  X 3, recall 3/3 immediate and 2/3 at 5 minutes without cues, and able to name 3/3 objects without any difficulties. Also able to spell as 5 letter word backwards without difficulty. Further MMSE tasks , such as writing, copying diagram, not done due to lack of cooperation at this time, but no evidence of gross cognitive decline at this time.     Principal Problem: MDD (major depressive disorder), recurrent episode, severe ([place]) Diagnosis:   Patient Active Problem List   Diagnosis Date Noted  . Severe episode of recurrent major depressive disorder, without psychotic features ([place]) [F33.2]   . Back pain [M54.9] 06/20/2015  . MDD (major depressive disorder), recurrent episode, severe ([place]) [F33.2] 06/16/2015  . Lumbar stenosis [M48.06] 04/19/2015  . Hypokalemia [E87.6] 09/19/2014  . Constipation [K59.00] 09/19/2014  . Acute encephalopathy [G93.40] 09/18/2014  . Acute respiratory failure ([place]) [J96.00] 09/17/2014  . OSA (obstructive sleep apnea) [G47.33] 09/17/2014  . Ankle fracture, lateral malleolus, closed [S82.63XA] 09/14/2014  . Major depressive disorder, recurrent severe without psychotic features ([place]) [F33.2] 08/07/2014  . Insomnia [G47.00] 08/05/2014  . Hip joint painful on movement [M25.559] 08/05/2014  . Degenerative disc disease, lumbar [M51.36] 08/05/2014  . Severe obesity (BMI >= 40) ([place]) [E66.01] 01/11/2014  . Hyperlipemia [E78.5]   . Depression [F32.9]   . Arthritis [M19.90]   . Polyneuropathy in other diseases classified elsewhere ([place]) [G63] 04/08/2013  . Restless legs syndrome (RLS) [G25.81] 04/08/2013  . UTI (lower urinary tract infection) [N39.0] 10/14/2011  .  Dizziness [R42] 10/12/2011  . Fall [W19.XXXA] 10/12/2011  . TIA (transient ischemic attack) [G45.9] 10/12/2011  . Cellulitis [L03.90] 10/12/2011  . HTN (hypertension) [I10] 10/12/2011  . GERD (gastroesophageal reflux disease) [K21.9] 10/12/2011    Total Time spent with patient:  25 minutes   Past Psychiatric History: SEE ABOVE   Past Medical History:  Past Medical History  Diagnosis Date  . GERD (gastroesophageal reflux disease)     hx pud '98  . Arthritis     djd  . Restless leg syndrome     takes Sinemet daily  . 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 Dysrhythmia     hx tachy palpitations  . Neuromuscular disorder ([place])     peripheral neuropathy, FEET AND LEGS  . Stroke [facility])     ? 6 months ago - tests okay - [facility]   . Cellulitis     10/11/11 hospitalized for cellulitis   . Hyperlipemia     takes Atorvastatin daily  . Peripheral neuropathy ([place])   . Elbow fracture, left April 2015  . Depression     takes Zoloft daily  . Vertigo     takes Meclizine daily as needed  . Insomnia     takes restoril nightly as needed  . Hypertension     takes HCTZ daily  . Peripheral neuropathy ([facility])     takes Gabapentin daily  . History of bronchitis     many yrs ago  . Headache(784.0)     migraines yrs ago-takes Imitrex daily  . Joint pain   . Joint swelling   . Chronic back pain     scoliosis/stenosis  . Anxiety     takes Ativan daily as needed  . Cataracts, bilateral     immature   . Depression     Past Surgical History  Procedure Laterality Date  . Cervical disc surgery x2  2011  . Rt carotid enarterectomy  2011  . Rt knee arthroscopy  '99  . Left knee arthroscopy  2001  . Hematoma evacuation  2011    s/p rt cea  . Joint replacement  '01    total knee replacement, LEFT  . Abdominal hysterectomy  1975    bil oophorectomy  . Back surgery   MANY YRS AGO    laminectomy x3  . Cholecystectomy  1993  . Total knee arthroplasty  09/13/2011    Procedure: TOTAL KNEE ARTHROPLASTY;  Surgeon: Magnus Sinning, MD;  Location: [facility]S;  Service: Orthopedics;  Laterality: Right;  . Right knee replacement      09/2011   . Carpal tunnel release  07/09/2012    Procedure: CARPAL TUNNEL RELEASE;  Surgeon: Magnus Sinning, MD;  Location: [facility];  Service: Orthopedics;  Laterality: Left;  . Finger arthroplasty  07/09/2012    Procedure: FINGER ARTHROPLASTY;  Surgeon: Magnus Sinning, MD;  Location: [facility];  Service: Orthopedics;  Laterality: Left;  Interposition Arthroplasty CMC Joint Thumb Left   . Lipoma excision  07/09/2012    Procedure: EXCISION LIPOMA;  Surgeon: Magnus Sinning, MD;  Location: [facility];  Service: Orthopedics;  Laterality: Left;  Excision Lipoma Dorsum Left Wrist   . Orif ankle fracture Right 09/14/2014    FIBULA   . Orif ankle fracture Right 09/14/2014    Procedure: OPEN REDUCTION INTERNAL FIXATION (ORIF) RIGHT ANKLE FRACTURE/SYNDESMOSIS ;  Surgeon: Wylene Simmer, MD;  Location: [facility];  Service: Orthopedics;  Laterality:  Right;  . Colonosocpy    . Esophagogastroduodenoscopy    . Lumbar laminectomy with coflex 1 level Bilateral 04/19/2015    Procedure: LUMBAR TWO-THREE LUMBAR LAMINECTOMY WITH COFLEX ;  Surgeon: Kristeen Miss, MD;  Location: [facility];  Service: Neurosurgery;  Laterality: Bilateral;  Bilateral L23 laminectomy and foraminotomy with coflex   Family History:  Family History  Problem Relation Age of Onset  . Congestive Heart Failure Father   . Congestive Heart Failure Sister   . Alzheimer's disease Mother   . Diabetes Brother   . Arthritis Brother    Family Psychiatric  History: SEE ABOVE Social History:  History  Alcohol Use No     History  Drug Use No    Social History   Social History  . Marital Status: Widowed    Spouse Name: N/A  . Number of Children: 2  . Years of Education: 57   Social History Main Topics  . Smoking status: Never Smoker   . Smokeless tobacco: Never Used  . Alcohol Use: No  . Drug Use: No  . Sexual Activity: Yes    Birth Control/ Protection: Surgical   Other Topics Concern  . None   Social History Narrative   Patient is widowed and lives alone.   Patient has two sons.   Patient is retired.   Patient has a college  education.   Patient is right-handed.   Patient drinks three glasses of Coke-soda daily.         Additional Social History:   Sleep:  Poor   Appetite:   Fair   Current Medications: Current Facility-Administered Medications  Medication Dose Route Frequency Provider Last Rate Last Dose  . acetaminophen (TYLENOL) tablet 650 mg  650 mg Oral Q6H PRN Delfin Gant, NP   650 mg at 06/17/15 2259  . aspirin tablet 325 mg  325 mg Oral Daily Delfin Gant, NP   325 mg at 06/22/15 0757  . atorvastatin (LIPITOR) tablet 40 mg  40 mg Oral Daily Delfin Gant, NP   40 mg at 06/22/15 0757  . calcium carbonate (OS-CAL - dosed in mg of elemental calcium) tablet 500 mg of elemental calcium  1 tablet Oral Q breakfast Delfin Gant, NP   500 mg of elemental calcium at 06/22/15 0757  . doxycycline (VIBRA-TABS) tablet 100 mg  100 mg Oral Q12H Theodis Blaze, MD   100 mg at 06/22/15 0757  . feeding supplement (ENSURE ENLIVE) (ENSURE ENLIVE) liquid 237 mL  237 mL Oral Q24H Maricela Bo [place], RD   237 mL at 06/21/15 1048  . gabapentin (NEURONTIN) capsule 600 mg  600 mg Oral TID Jenne Campus, MD   600 mg at 06/22/15 1147  . HYDROcodone-acetaminophen (NORCO/VICODIN) 5-325 MG per tablet 1 tablet  1 tablet Oral Q4H PRN Jenne Campus, MD   1 tablet at 06/22/15 0943  . lidocaine (LIDODERM) 5 % 1 patch  1 patch Transdermal Daily Delfin Gant, NP   1 patch at 06/22/15 0800  . LORazepam (ATIVAN) tablet 0.5 mg  0.5 mg Oral Q8H PRN Kerrie Buffalo, NP   0.5 mg at 06/21/15 2300  . meclizine (ANTIVERT) tablet 25 mg  25 mg Oral Daily PRN Delfin Gant, NP      . ondansetron (ZOFRAN-ODT) disintegrating tablet 4 mg  4 mg Oral Q8H PRN Jenne Campus, MD   4 mg at 06/22/15 0757  . potassium chloride (K-DUR,KLOR-CON) CR tablet 10 mEq  10 mEq Oral Daily Theodis Blaze, MD   10 mEq at 06/22/15 0626  . pyridOXINE (VITAMIN B-6) tablet 50 mg  50 mg Oral Daily Delfin Gant, NP   50 mg at 06/22/15  0757  . SUMAtriptan (IMITREX) tablet 50 mg  50 mg Oral Q2H PRN Delfin Gant, NP   50 mg at 06/22/15 0757  . temazepam (RESTORIL) capsule 7.5 mg  7.5 mg Oral QHS PRN Harriet Butte, NP   7.5 mg at 06/21/15 2300  . traMADol (ULTRAM) tablet 50 mg  50 mg Oral Q12H PRN Jenne Campus, MD   50 mg at 06/22/15 1057    Lab Results:  Results for orders placed or performed during the hospital encounter of 06/16/15 (from the past 48 hour(s))  Basic metabolic panel     Status: Abnormal   Collection Time: 06/21/15  6:30 AM  Result Value Ref Range   Sodium 136 135 - 145 mmol/L   Potassium 4.0 3.5 - 5.1 mmol/L   Chloride 95 (L) 101 - 111 mmol/L   CO2 28 22 - 32 mmol/L   Glucose, Bld 107 (H) 65 - 99 mg/dL   BUN 22 (H) 6 - 20 mg/dL   Creatinine, Ser 0.97 0.44 - 1.00 mg/dL   Calcium 10.0 8.9 - 10.3 mg/dL   GFR calc non Af Amer 57 (L) >60 mL/min   GFR calc Af Amer >60 >60 mL/min    Comment: (NOTE) The eGFR has been calculated using the CKD EPI equation. This calculation has not been validated in all clinical situations. eGFR's persistently <60 mL/min signify possible Chronic Kidney Disease.    Anion gap 13 5 - 15    Comment: Performed at [facility]  CBC with Differential/Platelet     Status: Abnormal   Collection Time: 06/21/15  6:30 AM  Result Value Ref Range   WBC 9.5 4.0 - 10.5 K/uL   RBC 4.56 3.87 - 5.11 MIL/uL   Hemoglobin 13.7 12.0 - 15.0 g/dL   HCT 43.3 36.0 - 46.0 %   MCV 95.0 78.0 - 100.0 fL   MCH 30.0 26.0 - 34.0 pg   MCHC 31.6 30.0 - 36.0 g/dL   RDW 13.5 11.5 - 15.5 %   Platelets 354 150 - 400 K/uL   Neutrophils Relative % 41 %   Neutro Abs 3.9 1.7 - 7.7 K/uL   Lymphocytes Relative 48 %   Lymphs Abs 4.6 (H) 0.7 - 4.0 K/uL   Monocytes Relative 8 %   Monocytes Absolute 0.8 0.1 - 1.0 K/uL   Eosinophils Relative 2 %   Eosinophils Absolute 0.2 0.0 - 0.7 K/uL   Basophils Relative 1 %   Basophils Absolute 0.1 0.0 - 0.1 K/uL    Comment: Performed at  [facility]  Uric acid     Status: Abnormal   Collection Time: 06/21/15  6:30 AM  Result Value Ref Range   Uric Acid, Serum 7.0 (H) 2.3 - 6.6 mg/dL    Comment: Performed at [facility]    Physical Findings: AIMS: Facial and Oral Movements Muscles of Facial Expression: None, normal Lips and Perioral Area: None, normal Jaw: None, normal Tongue: None, normal,Extremity Movements Upper (arms, wrists, hands, fingers): None, normal Lower (legs, knees, ankles, toes): None, normal, Trunk Movements Neck, shoulders, hips: None, normal, Overall Severity Severity of abnormal movements (highest score from questions above): None, normal Incapacitation due to abnormal movements: None, normal Patient's awareness of abnormal  movements (rate only patient's report): No Awareness, Dental Status Current problems with teeth and/or dentures?: No Does patient usually wear dentures?: No  CIWA:    COWS:     Musculoskeletal: Strength & Muscle Tone: within normal limits Gait & Station: unsteady patient use walker/wheelchaire for ambulation assistances Patient leans: N/A  Psychiatric Specialty Exam: Review of Systems  Constitutional: Negative.   HENT: Negative.   Eyes: Negative.   Musculoskeletal: Positive for back pain.       Leg pain 10/10  Skin: Negative.   Neurological: Positive for tingling.  Psychiatric/Behavioral: Positive for depression. The patient is nervous/anxious and has insomnia.   All other systems reviewed and are negative.   Blood pressure 144/53, pulse 85, temperature 98.3 F (36.8 C), temperature source Oral, resp. rate 16, height 5' 3"  (1.6 m), weight 196 lb (88.905 kg), SpO2 100 %.Body mass index is 34.73 kg/(m^2).  General Appearance: Fairly Groomed  Engineer, water::  Improved   Speech:  Clear and Coherent  Volume:  Normal   Mood:   Mood  depressed , somewhat less irritable today  Affect:    Constricted, irritable   Thought Process:    Linear   Orientation:  Full (Time, Place, and Person)  Thought Content:  denies hallucinations and does not appear internally preoccupied , no delusions expressed   Suicidal Thoughts:  Today denies plan or intention of hurting self or of suicide, does express passive SI as above   Homicidal Thoughts:  No  Memory:  Recent and remote grossly intact   Judgement:  Improving   Insight:  Fair  Psychomotor Activity:   No restlessness noted at this time  Concentration:  Good  Recall:  Good  Fund of Knowledge:Good  Language: Good  Akathisia:  No  Handed:  Right  AIMS (if indicated):     Assets:  Communication Skills Desire for Improvement Social Support  ADL's:  Intact  Cognition: WNL  Sleep:  Number of Hours: 3.75    Assessment - patient presents with ongoing irritability, minimal milieu participation, depression. No psychotic symptoms, no symptoms of mania or hypomania, other than irritability, which she attributes to chronic pain. Denies suicidal plan or intent but did endorse passive SI. She remains in bed most of the day, up briefly for bathroom or meals, but no participation in PT or milieu. Today, although still irritable, patient presents more conversant, slightly more insightful, and seeming more amenable to start participating in milieu a little more . Thus far tolerating medications well .   Treatment Plan Summary:  Daily contact with patient to assess and evaluate symptoms and progress in treatment and Medication management  Encourage increased milieu, group participation, in order to work on coping skills and symptom reduction. Continue to encourage , provide support, reassurance Increase CYMBALTA further to 60   mgrs QDAY for depression/ pain  Increase  NEURONTIN to 600 mgrs QID  for anxiety and pain Decrease ULTRAM to 50  mgrs Q 12 hours PRN severe pain.  Patient denies side effect Continue ATIVAN 0.5 mgrs Q 8 hours PRN for anxiety, as needed Encouraged patient to work with  PT Consultant  Will repeat CBC, BMP in AM Appreciate Hospitalist consultation   Neita Garnet , MD   06/22/2015, 12:28 PM

## 2015-06-22 NOTE — BHH Group Notes (Signed)
[facility] LCSW Aftercare Discharge Planning Group Note  06/22/2015 8:45 AM  Pt did not attend, declined invitation.   Peri Maris, [place] 06/22/2015 12:17 PM

## 2015-06-22 NOTE — Tx Team (Signed)
Interdisciplinary Treatment Plan Update (Adult) Date: 06/22/2015   Date: 06/22/2015 12:18 PM  Progress in Treatment:  Attending groups: No, Pt refuses Participating in groups: No  Taking medication as prescribed: Yes  Tolerating medication: Yes  Family/Significant othe contact made:Yes with son Patient understands diagnosis: Yes AEB seeking help with depression Discussing patient identified problems/goals with staff: Yes  Medical problems stabilized or resolved: Yes  Denies suicidal/homicidal ideation: Yes Patient has not harmed self or Others: Yes   New problem(s) identified: None identified at this time.   Discharge Plan or Barriers: Pt will return home and follow-up with outpatient resources  Additional comments:  Patient and CSW reviewed pt's identified goals and treatment plan. Patient verbalized understanding and agreed to treatment plan. CSW reviewed [facility] "Discharge Process and Patient Involvement" Form. Pt verbalized understanding of information provided and signed form.   Reason for Continuation of Hospitalization:  Depression Medication stabilization  Estimated length of stay: 2-3 days  Review of initial/current patient goals per problem list:   1.  Goal(s): Patient will participate in aftercare plan  Met:  Yes  Target date: 3-5 days from date of admission   As evidenced by: Patient will participate within aftercare plan AEB aftercare provider and housing plan at discharge being identified.   06/17/15: Pt will return home and follow-up with outpatient resources  2.  Goal (s): Patient will exhibit decreased depressive symptoms and suicidal ideations.  Met:  No  Target date: 3-5 days from date of admission   As evidenced by: Patient will utilize self rating of depression at 3 or below and demonstrate decreased signs of depression or be deemed stable for discharge by MD. 06/17/15: Pt was admitted with symptoms of depression, rating 10/10. Pt continues to present with  flat affect and depressive symptoms.  Pt will demonstrate decreased symptoms of depression and rate depression at 3/10 or lower prior to discharge. 06/21/15: Pt continues to endorse passive thoughts of dying and is irritable with staff; Pt refuses to participate in programming and is observed to be tearful.  Attendees:  Patient:    Family:    Physician: Dr. Parke Poisson, MD  06/22/2015 12:18 PM  Nursing: Lars Pinks, RN Case manager  06/22/2015 12:18 PM  Clinical Social Worker Peri Maris, [place] 06/22/2015 12:18 PM  Other: Tilden Fossa, [place] 06/22/2015 12:18 PM  Clinical:  Perry Mount, RN 06/22/2015 12:18 PM  Other: , RN Charge Nurse 06/22/2015 12:18 PM  Other: Hilda Lias, [place], [place] Social Work [phone]

## 2015-06-22 NOTE — Progress Notes (Addendum)
Pt fell ambulating from the bathroom. Pt stated she stepped on her sock and fell on her "Bum" . Pt was assessed and appeared to be in no acute distress. Pt eyes PERRLA. Pt was educated on using the call bell when ambulating. NP May Agustin called and stated to continue to monitor.    06/22/15 1832  What Happened  Was fall witnessed? No  Was patient injured? No  Patient found in bathroom  Found by Staff-comment  Stated prior activity bathroom-unassisted  Follow Up  MD notified yes  Time MD notified 4  Family notified No- patient refusal  Additional tests No  Simple treatment Other (comment) (monitored)  Adult Fall Risk Assessment  Risk Factor Category (scoring not indicated) High fall risk per protocol (document High fall risk)  Patient's Fall Risk High Fall Risk (>13 points)  Adult Fall Risk Interventions  Required Bundle Interventions *See Row Information* High fall risk - low, moderate, and high requirements implemented  Additional Interventions Appropriate bed frame use with air overlay;Assess orthostatic BP;Fall risk signage;Other (Comment);Specialty bed:  Low bed  Fall with Injury Screening  Risk For Fall Injury- See Row Information  Nurse judgement  Intervention(s) for 2 or more risk criteria identified Low Bed  Vitals  BP (!) 156/66 mmHg  Patient Position (if appropriate) Sitting  Pain Assessment  Pain Assessment 0-10  Pain Score 9  Pain Type Chronic pain  Pain Location Foot  Pain Orientation Right  PAINAD (Pain Assessment in Advanced Dementia)  Breathing 0  Negative Vocalization 0  Facial Expression 0  Body Language 0  Consolability 0  PAINAD Score 0  Neurological  Neuro (WDL) WDL  Musculoskeletal  Musculoskeletal (WDL) X (Right ankle pain (surgery) Uses walker at home. )  Integumentary  Integumentary (WDL) WDL

## 2015-06-22 NOTE — Progress Notes (Signed)
Recreation Therapy Notes  Date: 01.18.2017 Time: 9:30am Location: [address]   Group Topic: Stress Management  Goal Area(s) Addresses:  Patient will actively participate in stress management techniques presented during session.   Behavioral Response: Did not attend.    Laureen Ochs Phyllis Abelson, LRT/CTRS        Kyasia Steuck L 06/22/2015 11:45 AM

## 2015-06-22 NOTE — BHH Group Notes (Signed)
[place] LCSW Group Therapy 06/22/2015 1:15 PM  Type of Therapy: Group Therapy- Emotion Regulation  Pt did not attend, declined invitation.   Peri Maris, [place] 06/22/2015 3:24 PM

## 2015-06-22 NOTE — Progress Notes (Signed)
D: Patient in her room isolating all shift.  Patient states she has been demanding pain medications even when they are not due.  Patient is currently on fall protocol.  Patient conversation is constantly about medications and how they are still not right. Patient refuses to participate and does not appear to have insight.  Patient has also been calling staff names like, "Godzilla." Patient denies SI/HI and denies AVH.   A: Staff to monitor Q 15 mins for safety.  Encouragement and support offered.  Scheduled medications administered per orders.  Several prn medications administered.  See MAR. R: Patient remains safe on the unit.  Patient attended group tonight.  Patient not visible on unit anf not interacting with peers.  Patient taking administered medications.

## 2015-06-22 NOTE — BHH Group Notes (Signed)
Adult Psychoeducational Group Note  Date:  06/22/2015 Time:  10:15 PM  Group Topic/Focus:  Wrap-Up Group:   The focus of this group is to help patients review their daily goal of treatment and discuss progress on daily workbooks.  Participation Level:  Did Not Attend  Participation Quality:  Drowsy  Affect:  Flat  Cognitive:  Lacking  Insight: None  Engagement in Group:  None  Modes of Intervention:  none  Additional Comments:  Pt was not in group pt was in bed during group time   Eusebia Grulke R 06/22/2015, 10:15 PM

## 2015-06-22 NOTE — Progress Notes (Signed)
D: Patient unwilling to engage or participate in group programming request 1:1 therapy. Pt preoccupied with getting Vicodin and will not take any other pain medication.  Pt pain level is 10 on 0-10 scale and stays at 10 even with medications. Pt denies SI/HI/AVH.  A: Met with pt 1:1. Medications administered as prescribed. Support and encouragement provided.  R: Patient remains safe and complaint with medications.

## 2015-06-23 LAB — CBC WITH DIFFERENTIAL/PLATELET
BASOS ABS: 0 10*3/uL (ref 0.0–0.1)
BASOS PCT: 1 %
EOS ABS: 0.2 10*3/uL (ref 0.0–0.7)
EOS PCT: 3 %
HCT: 40 % (ref 36.0–46.0)
HEMOGLOBIN: 12.4 g/dL (ref 12.0–15.0)
LYMPHS ABS: 3.4 10*3/uL (ref 0.7–4.0)
Lymphocytes Relative: 51 %
MCH: 30.1 pg (ref 26.0–34.0)
MCHC: 31 g/dL (ref 30.0–36.0)
MCV: 97.1 fL (ref 78.0–100.0)
Monocytes Absolute: 0.6 10*3/uL (ref 0.1–1.0)
Monocytes Relative: 9 %
NEUTROS PCT: 36 %
Neutro Abs: 2.3 10*3/uL (ref 1.7–7.7)
PLATELETS: 321 10*3/uL (ref 150–400)
RBC: 4.12 MIL/uL (ref 3.87–5.11)
RDW: 13.7 % (ref 11.5–15.5)
WBC: 6.5 10*3/uL (ref 4.0–10.5)

## 2015-06-23 LAB — BASIC METABOLIC PANEL
Anion gap: 10 (ref 5–15)
BUN: 25 mg/dL — AB (ref 6–20)
CHLORIDE: 102 mmol/L (ref 101–111)
CO2: 28 mmol/L (ref 22–32)
CREATININE: 0.89 mg/dL (ref 0.44–1.00)
Calcium: 9.8 mg/dL (ref 8.9–10.3)
Glucose, Bld: 98 mg/dL (ref 65–99)
Potassium: 3.7 mmol/L (ref 3.5–5.1)
SODIUM: 140 mmol/L (ref 135–145)

## 2015-06-23 NOTE — B[facility] Group Notes (Signed)
Adult Psychoeducational Group Note  Date:  06/23/2015 Time:  9:15 PM  Group Topic/Focus:  Wrap-Up Group:   The focus of this group is to help patients review their daily goal of treatment and discuss progress on daily workbooks.  Participation Level:  Did Not Attend  Participation Quality:  Did not attend  Affect:  None  Cognitive:  None  Insight: None  Engagement in Group:  None  Modes of Intervention:  Discussion  Additional Comments:  Did not attend group  Victorino Sparrow A 06/23/2015, 9:15 PM

## 2015-06-23 NOTE — Progress Notes (Signed)
Patient ID: [name], female   DOB: 12/29/1940, 75 y.o.   MRN: [id number] [facility] MD Progress Note  06/23/2015 4:11 PM [name]  MRN:  [id number]   Subjective:  Patient continues to focus on her chronic pain and on her medications. She reports she is upset with staff because even though Neurontin is now at her usual dose ( 600 mgrs QID) it is not being given at the times she was taking them at home . She states she has not medication side effects, but that she feels current pain medications are not sufficient to address her pain.     Objective:  I have discussed case with treatment team and have met with patient. As per staff patient has continued to isolate in her room, out of bed only briefly, to go to the bathroom .  Staff report indicates that patient reported fall yesterday ( unwitnessed ) and ongoing pain, but that there was no bruising . Patient states she " fell on my butt " . Denies head injury or LOC. As above, remains irritable, at times loud and angry with staff, with little participation in milieu. There has been some improvement , however, compared to her admission. 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 Patient has refused to work with PT thus far , and I have encouraged her to do so .  Regarding milieu, groups, patient states " it is not just the pain, it is that I do not think I need those groups ". Patient describes depression, but does endorse slight improvement. Denies suicidal ideations, denies psychotic symptoms. At this time denies medication side effects. Labs - CBC and BMP unremarkable - BUN 25, CR 0.89, GFR > 60     Principal Problem: MDD (major depressive disorder), recurrent episode, severe ([place]) Diagnosis:   Patient Active Problem List   Diagnosis Date Noted  . Severe episode of recurrent major  depressive disorder, without psychotic features ([place]) [F33.2]   . Back pain [M54.9] 06/20/2015  . MDD (major depressive disorder), recurrent episode, severe ([place]) [F33.2] 06/16/2015  . Lumbar stenosis [M48.06] 04/19/2015  . Hypokalemia [E87.6] 09/19/2014  . Constipation [K59.00] 09/19/2014  . Acute encephalopathy [G93.40] 09/18/2014  . Acute respiratory failure ([place]) [J96.00] 09/17/2014  . OSA (obstructive sleep apnea) [G47.33] 09/17/2014  . Ankle fracture, lateral malleolus, closed [S82.63XA] 09/14/2014  . Major depressive disorder, recurrent severe without psychotic features ([place]) [F33.2] 08/07/2014  . Insomnia [G47.00] 08/05/2014  . Hip joint painful on movement [M25.559] 08/05/2014  . Degenerative disc disease, lumbar [M51.36] 08/05/2014  . Severe obesity (BMI >= 40) ([place]) [E66.01] 01/11/2014  . Hyperlipemia [E78.5]   . Depression [F32.9]   . Arthritis [M19.90]   . Polyneuropathy in other diseases classified elsewhere ([place]) [G63] 04/08/2013  . Restless legs syndrome (RLS) [G25.81] 04/08/2013  . UTI (lower urinary tract infection) [N39.0] 10/14/2011  . Dizziness [R42] 10/12/2011  . Fall [W19.XXXA] 10/12/2011  . TIA (transient ischemic attack) [G45.9] 10/12/2011  . Cellulitis [L03.90] 10/12/2011  . HTN (hypertension) [I10] 10/12/2011  . GERD (gastroesophageal reflux disease) [K21.9] 10/12/2011   Total Time spent with patient:  25 minutes   Past Psychiatric History: SEE ABOVE   Past Medical History:  Past Medical History  Diagnosis Date  . GERD (gastroesophageal reflux disease)     hx pud '98  . Arthritis  djd  . Restless leg syndrome     takes Sinemet daily  . 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 Dysrhythmia     hx tachy palpitations  . Neuromuscular disorder ([place])     peripheral neuropathy, FEET AND LEGS  . Stroke [facility])     ? 6 months ago - tests okay - [facility]   . Cellulitis     10/11/11 hospitalized for cellulitis   . Hyperlipemia     takes  Atorvastatin daily  . Peripheral neuropathy ([place])   . Elbow fracture, left April 2015  . Depression     takes Zoloft daily  . Vertigo     takes Meclizine daily as needed  . Insomnia     takes restoril nightly as needed  . Hypertension     takes HCTZ daily  . Peripheral neuropathy (HCC)     takes Gabapentin daily  . History of bronchitis     many yrs ago  . Headache(784.0)     migraines yrs ago-takes Imitrex daily  . Joint pain   . Joint swelling   . Chronic back pain     scoliosis/stenosis  . Anxiety     takes Ativan daily as needed  . Cataracts, bilateral     immature   . Depression     Past Surgical History  Procedure Laterality Date  . Cervical disc surgery x2  2011  . Rt carotid enarterectomy  2011  . Rt knee arthroscopy  '99  . Left knee arthroscopy  2001  . Hematoma evacuation  2011    s/p rt cea  . Joint replacement  '01    total knee replacement, LEFT  . Abdominal hysterectomy  1975    bil oophorectomy  . Back surgery   MANY YRS AGO    laminectomy x3  . Cholecystectomy  1993  . Total knee arthroplasty  09/13/2011    Procedure: TOTAL KNEE ARTHROPLASTY;  Surgeon: Magnus Sinning, MD;  Location: [facility];  Service: Orthopedics;  Laterality: Right;  . Right knee replacement      09/2011   . Carpal tunnel release  07/09/2012    Procedure: CARPAL TUNNEL RELEASE;  Surgeon: Magnus Sinning, MD;  Location: [facility];  Service: Orthopedics;  Laterality: Left;  . Finger arthroplasty  07/09/2012    Procedure: FINGER ARTHROPLASTY;  Surgeon: Magnus Sinning, MD;  Location: [facility];  Service: Orthopedics;  Laterality: Left;  Interposition Arthroplasty CMC Joint Thumb Left   . Lipoma excision  07/09/2012    Procedure: EXCISION LIPOMA;  Surgeon: Magnus Sinning, MD;  Location: [facility];  Service: Orthopedics;  Laterality: Left;  Excision Lipoma Dorsum Left Wrist   . Orif ankle fracture Right 09/14/2014    FIBULA   . Orif ankle fracture Right 09/14/2014    Procedure: OPEN  REDUCTION INTERNAL FIXATION (ORIF) RIGHT ANKLE FRACTURE/SYNDESMOSIS ;  Surgeon: Wylene Simmer, MD;  Location: [place];  Service: Orthopedics;  Laterality: Right;  . Colonosocpy    . Esophagogastroduodenoscopy    . Lumbar laminectomy with coflex 1 level Bilateral 04/19/2015    Procedure: LUMBAR TWO-THREE LUMBAR LAMINECTOMY WITH COFLEX ;  Surgeon: Kristeen Miss, MD;  Location: [place] [facility];  Service: Neurosurgery;  Laterality: Bilateral;  Bilateral L23 laminectomy and foraminotomy with coflex   Family History:  Family History  Problem Relation Age of Onset  . Congestive Heart Failure Father   . Congestive Heart Failure Sister   .  Alzheimer's disease Mother   . Diabetes Brother   . Arthritis Brother    Family Psychiatric  History: SEE ABOVE Social History:  History  Alcohol Use No     History  Drug Use No    Social History   Social History  . Marital Status: Widowed    Spouse Name: N/A  . Number of Children: 2  . Years of Education: 25   Social History Main Topics  . Smoking status: Never Smoker   . Smokeless tobacco: Never Used  . Alcohol Use: No  . Drug Use: No  . Sexual Activity: Yes    Birth Control/ Protection: Surgical   Other Topics Concern  . None   Social History Narrative   Patient is widowed and lives alone.   Patient has two sons.   Patient is retired.   Patient has a college education.   Patient is right-handed.   Patient drinks three glasses of Coke-soda daily.         Additional Social History:   Sleep:  Fair, partially improved   Appetite:   Fair - improved PO intake   Current Medications: Current Facility-Administered Medications  Medication Dose Route Frequency Provider Last Rate Last Dose  . acetaminophen (TYLENOL) tablet 650 mg  650 mg Oral Q6H PRN Delfin Gant, NP   650 mg at 06/17/15 2259  . aspirin tablet 325 mg  325 mg Oral Daily Delfin Gant, NP   325 mg at 06/23/15 0830  . atorvastatin (LIPITOR) tablet 40 mg  40 mg Oral  Daily Delfin Gant, NP   40 mg at 06/23/15 0830  . calcium carbonate (OS-CAL - dosed in mg of elemental calcium) tablet 500 mg of elemental calcium  1 tablet Oral Q breakfast Delfin Gant, NP   500 mg of elemental calcium at 06/23/15 0830  . doxycycline (VIBRA-TABS) tablet 100 mg  100 mg Oral Q12H Theodis Blaze, MD   100 mg at 06/23/15 0830  . DULoxetine (CYMBALTA) DR capsule 60 mg  60 mg Oral Daily Jenne Campus, MD   60 mg at 06/23/15 0830  . feeding supplement (ENSURE ENLIVE) (ENSURE ENLIVE) liquid 237 mL  237 mL Oral Q24H Maricela Bo Ost[place], RD   237 mL at 06/21/15 1048  . gabapentin (NEURONTIN) capsule 600 mg  600 mg Oral QID Jenne Campus, MD   600 mg at 06/23/15 1150  . HYDROcodone-acetaminophen (NORCO/VICODIN) 5-325 MG per tablet 1 tablet  1 tablet Oral Q4H PRN Jenne Campus, MD   1 tablet at 06/23/15 1327  . lidocaine (LIDODERM) 5 % 1 patch  1 patch Transdermal Daily Delfin Gant, NP   1 patch at 06/23/15 0840  . LORazepam (ATIVAN) tablet 0.5 mg  0.5 mg Oral Q12H PRN Jenne Campus, MD   0.5 mg at 06/23/15 1524  . meclizine (ANTIVERT) tablet 25 mg  25 mg Oral Daily PRN Delfin Gant, NP      . ondansetron (ZOFRAN-ODT) disintegrating tablet 4 mg  4 mg Oral Q8H PRN Jenne Campus, MD   4 mg at 06/22/15 0757  . potassium chloride (K-DUR,KLOR-CON) CR tablet 10 mEq  10 mEq Oral Daily Theodis Blaze, MD   10 mEq at 06/23/15 0830  . pyridOXINE (VITAMIN B-6) tablet 50 mg  50 mg Oral Daily Delfin Gant, NP   50 mg at 06/23/15 0830  . SUMAtriptan (IMITREX) tablet 50 mg  50 mg Oral Q2H PRN Josephine C  Onuoha, NP   50 mg at 06/23/15 0057  . temazepam (RESTORIL) capsule 7.5 mg  7.5 mg Oral QHS PRN Harriet Butte, NP   7.5 mg at 06/22/15 2343  . traMADol (ULTRAM) tablet 50 mg  50 mg Oral Q12H PRN Jenne Campus, MD   50 mg at 06/23/15 1524    Lab Results:  Results for orders placed or performed during the hospital encounter of 06/16/15 (from the past 48  hour(s))  Basic metabolic panel     Status: Abnormal   Collection Time: 06/23/15  6:45 AM  Result Value Ref Range   Sodium 140 135 - 145 mmol/L   Potassium 3.7 3.5 - 5.1 mmol/L   Chloride 102 101 - 111 mmol/L   CO2 28 22 - 32 mmol/L   Glucose, Bld 98 65 - 99 mg/dL   BUN 25 (H) 6 - 20 mg/dL   Creatinine, Ser 0.89 0.44 - 1.00 mg/dL   Calcium 9.8 8.9 - 10.3 mg/dL   GFR calc non Af Amer >60 >60 mL/min   GFR calc Af Amer >60 >60 mL/min    Comment: (NOTE) The eGFR has been calculated using the CKD EPI equation. This calculation has not been validated in all clinical situations. eGFR's persistently <60 mL/min signify possible Chronic Kidney Disease.    Anion gap 10 5 - 15    Comment: Performed at [facility]  CBC with Differential/Platelet     Status: None   Collection Time: 06/23/15  6:45 AM  Result Value Ref Range   WBC 6.5 4.0 - 10.5 K/uL   RBC 4.12 3.87 - 5.11 MIL/uL   Hemoglobin 12.4 12.0 - 15.0 g/dL   HCT 40.0 36.0 - 46.0 %   MCV 97.1 78.0 - 100.0 fL   MCH 30.1 26.0 - 34.0 pg   MCHC 31.0 30.0 - 36.0 g/dL   RDW 13.7 11.5 - 15.5 %   Platelets 321 150 - 400 K/uL   Neutrophils Relative % 36 %   Neutro Abs 2.3 1.7 - 7.7 K/uL   Lymphocytes Relative 51 %   Lymphs Abs 3.4 0.7 - 4.0 K/uL   Monocytes Relative 9 %   Monocytes Absolute 0.6 0.1 - 1.0 K/uL   Eosinophils Relative 3 %   Eosinophils Absolute 0.2 0.0 - 0.7 K/uL   Basophils Relative 1 %   Basophils Absolute 0.0 0.0 - 0.1 K/uL    Comment: Performed at [facility]    Physical Findings: AIMS: Facial and Oral Movements Muscles of Facial Expression: None, normal Lips and Perioral Area: None, normal Jaw: None, normal Tongue: None, normal,Extremity Movements Upper (arms, wrists, hands, fingers): None, normal Lower (legs, knees, ankles, toes): None, normal, Trunk Movements Neck, shoulders, hips: None, normal, Overall Severity Severity of abnormal movements (highest score from  questions above): None, normal Incapacitation due to abnormal movements: None, normal Patient's awareness of abnormal movements (rate only patient's report): No Awareness, Dental Status Current problems with teeth and/or dentures?: No Does patient usually wear dentures?: No  CIWA:    COWS:     Musculoskeletal: Strength & Muscle Tone: within normal limits Gait & Station: unsteady patient use walker/wheelchaire for ambulation assistances Patient leans: N/A  Psychiatric Specialty Exam: Review of Systems  Constitutional: Negative.   HENT: Negative.   Eyes: Negative.   Musculoskeletal: Positive for back pain.       Leg pain 10/10  Skin: Negative.   Neurological: Positive for tingling.  Psychiatric/Behavioral: Positive for depression.  The patient is nervous/anxious and has insomnia.   All other systems reviewed and are negative. describes chronic back pain and foot pain. Denies chest pain, denies SOB   Blood pressure 140/47, pulse 93, temperature 97.3 F (36.3 C), temperature source Oral, resp. rate 20, height 5' 3"  (1.6 m), weight 196 lb (88.905 kg), SpO2 99 %.Body mass index is 34.73 kg/(m^2).  General Appearance: Fairly Groomed  Engineer, water::  Improving   Speech:  Clear and Coherent  Volume:  Normal   Mood:   Depressed, dysphoric   Affect:    Still significantly  irritable , but does smile appropriately at times   Thought Process:   Linear   Orientation:  Full (Time, Place, and Person)  Thought Content:  denies hallucinations and does not appear internally preoccupied , no delusions expressed   Suicidal Thoughts:  Today denies plan or intention of hurting self or of suicide  Homicidal Thoughts:  No  Memory:  Recent and remote grossly intact   Judgement:  Improving   Insight:  Fair  Psychomotor Activity:   No restlessness noted at this time  Concentration:  Good  Recall:  Good  Fund of Knowledge:Good  Language: Good  Akathisia:  No  Handed:  Right  AIMS (if indicated):      Assets:  Communication Skills Desire for Improvement Social Support  ADL's:  Intact  Cognition: WNL  Sleep:  Number of Hours: 3.75    Assessment -  Patient presents with depression, irritability, and remains focused on pain and medications . She does not appear to be in any acute distress and presents comfortable in bed. She has been uncooperative with PT and non participative in milieu. Of note, patient attributes irritability to pain, and has not endorsed any clear history of mania or bipolarity.  Although denies SI and affect seems to be slowly improving , CSW Aldean Ast has  Spoken with her adult children on the phone and they have reported concerns about her ability to function independently at this time, stating she was functioning poorly prior to admission and may not be able to return to independent living at this time.  At this time tolerating medications well .   Treatment Plan Summary:  Daily contact with patient to assess and evaluate symptoms and progress in treatment and Medication management  Encourage increased milieu, group participation, in order to work on coping skills and symptom reduction. Continue to encourage , provide support, reassurance Continue CYMBALTA 60   mgrs QDAY for depression/ pain  Continue  NEURONTIN to 600 mgrs QID  for anxiety and pain Continue LIDODERM patch for back pain Continue VICODIN 5/325  mgrs  Q 4 hours  PRN for pain  Continue TEMAZEPAM 7.5 mgrs QHS PRN [place]  Continue ATIVAN 0.5 mgrs Q 8 hours PRN for anxiety, as needed Encouraged patient to work with PT Consultant- will re-consult. Will also request OT consult to assess independent functioning capacity   [place] AM   Neita Garnet , MD   06/23/2015, 4:11 PM

## 2015-06-23 NOTE — BHH Counselor (Signed)
[facility] Group Therapy 06/23/2015 1:15pm  Type of Therapy: Mental Health Association Presentation  Pt did not attend, declined invitation.    Peri Maris, [place] 06/23/2015 1:23 PM

## 2015-06-23 NOTE — Progress Notes (Signed)
Adult Psychoeducational Group Note  Date:  06/23/2015 Time:  0915  Group Topic/Focus:  Orientation:   The focus of this group is to educate the patient on the purpose and policies of crisis stabilization and provide a format to answer questions about their admission.  The group details unit policies and expectations of patients while admitted.  Participation Level:  Did Not Attend  Participation Quality:    Affect:    Cognitive:    Insight:   Engagement in Group:    Modes of Intervention:    Additional Comments:    Mikyle Sox L 06/23/2015, 12:04 PM

## 2015-06-23 NOTE — Progress Notes (Signed)
D: Patient lying in bed.  She wanted her pain medication this morning and then wanted to know when the next dose is due.  Patient will only get out of bed to go to the bathroom.  [name] complaining of back pain from her fall last night.  [name] demanding to see a doctor because she wants "my pain medication increased."  She denies SI/HI/AVH.  Encouraged patient to get up walk. A: Continue to monitor medication management and MD orders.  Safety checks completed every 15 minutes per protocol.  Offer support and encouragement as needed. R: Patient is unwilling to participate in her treatment.

## 2015-06-23 NOTE — Progress Notes (Signed)
Physical Therapy Treatment Patient Details Name: [name] MRN: [id number] DOB: 1940/07/17 Today's Date: 06/23/2015    History of Present Illness 74 you who presetns to ED by her son with depression, crying . Pt today states she has pain in her ankle and her back, and fearful of falling for she hasn't been up a lot lately and her R knee gives way at times. PMH: 04/20/2015 L2-L3  lumbar laminectomy decompression surgery by Dr. Ellene Route, then DC to [facility] for Short Term rehab until 05/09/2015. Then she DC home at that time. PMH also with 09/2014 R ankle ORIF by Dr. 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     PT Comments    Pt worked with me today, however states she is not feeling like she is getting weak and she reports walking around in her room independently. We performed 3 walks in her room with RW and seated rest breaks in between. She didn't seem labored and tolerated fairly well. Encouraged patient to continue to get OOB and walk with RW.    Follow Up Recommendations  Home health PT;Supervision - Intermittent     Equipment Recommendations  None recommended by PT    Recommendations for Other Services       Precautions / Restrictions Precautions Precautions: Back Precaution Comments: understands these and had back brace after her surgery , but states she was going to ask him if she still needed to wear it at her f/u visit this month.     Mobility  Bed Mobility Overal bed mobility: Modified Independent                Transfers Overall transfer level: Modified independent Equipment used: Rolling walker (2 wheeled)             General transfer comment: took tray off RW so that she could stand more inside her RW for support and safety. Tried to lower the height as she requested, however the RW does not have the ability to go any lower. Educated to use the RW when she gets up.   Ambulation/Gait Ambulation/Gait  assistance: Min guard Ambulation Distance (Feet): 30 Feet (performed 3 x with each seated rest break in room with RW .Marland Kitchen 30 feet each time. ) Assistive device: Rolling walker (2 wheeled)       General Gait Details: did not witness knees buckling or giving way during this ambulation. Pt states she does this in the room by herself and has done this several times today. However as noted in the chart pt fell yesterday and she states she tripped on her sock.    Stairs            Wheelchair Mobility    Modified Rankin (Stroke Patients Only)       Balance                                    Cognition Arousal/Alertness: Awake/alert Behavior During Therapy: WFL for tasks assessed/performed Overall Cognitive Status: Within Functional Limits for tasks assessed                      Exercises Other Exercises Other Exercises: performed seated LAQ BLE 5 x each, and then repeated with holds and 3 ankle pumps. Then seated BUE arm elevations 10x holding [place] for weights. Then stopped due  to pt stated she was tired.     General Comments        Pertinent Vitals/Pain Pain Assessment:  (didn't give me a number adn stated " as usually, for her foot pain, but better" ) Pain Location: foot pain is it "usual", didnt rate, and stated her pain at the time of our session was he low back centralized with no radiation of pain.During our session didn't look like things hurt , however once I did sitting exercises she stated " I am tired and that makes me hurt more, I am done".  Pain Intervention(s): Monitored during session;Premedicated before session (pt and nurse stated she had just received her pain medicines)    Home Living                      Prior Function            PT Goals (current goals can now be found in the care plan section) Progress towards PT goals: Progressing toward goals    Frequency  Min 2X/week    PT Plan Current plan remains  appropriate    Co-evaluation             End of Session   Activity Tolerance: Patient limited by fatigue;Patient limited by pain Patient left: in bed     Time: 1601-1630 PT Time Calculation (min) (ACUTE ONLY): 29 min  Charges:  $Gait Training: 8-22 mins $Therapeutic Exercise: 8-22 mins                    G CodesClide Dales 2015-07-10, 5:21 PM Clide Dales, PT Pager: [phone] 2015-07-10

## 2015-06-24 MED ORDER — LORATADINE 10 MG PO TABS
10.0000 mg | ORAL_TABLET | Freq: Every day | ORAL | Status: DC
  Administered 2015-06-24 – 2015-06-28 (×5): 10 mg via ORAL
  Filled 2015-06-24 (×8): qty 1

## 2015-06-24 MED ORDER — BISACODYL 5 MG PO TBEC
10.0000 mg | DELAYED_RELEASE_TABLET | Freq: Once | ORAL | Status: AC
  Administered 2015-06-24 (×2): 10 mg via ORAL
  Filled 2015-06-24 (×2): qty 2

## 2015-06-24 NOTE — Progress Notes (Signed)
  Pt currently sitting in dayroom, reading a book and watching the news. Pt states "I put on real clothes," pt completed ADL'S. Pt given a 1:1, emotionally supported. Will continue to monitor.

## 2015-06-24 NOTE — Progress Notes (Signed)
Patient ID: [name], female   DOB: [birth year], 75 y.o.   MRN: [id number]  Pt currently presents with a depressed affect and labile behavior. Pt is angry, tearful and pleasant during interactions with staff today. Pt complains of headache, nausea and constipation today.   Pt provided with scheduled and prn medications per providers orders. Pt's labs and vitals were monitored throughout the day. Pt supported emotionally and encouraged to express concerns and questions. Pt educated on medications. Pt given prune juice and dulcolax in attempt to relieve constipation. Pt given a therapeutic back massage and heat packs for he hip and back pain.   Pt's safety ensured with 15 minute and environmental checks. Pt currently denies SI/HI and A/V hallucinations. Pt verbally agrees to seek staff if SI/HI or A/VH occurs and to consult with staff before acting on these thoughts. Will continue POC.

## 2015-06-24 NOTE — Progress Notes (Addendum)
Patient ID: [name], female   DOB: August 29, 1940, 75 y.o.   MRN: [id number] [facility] MD Progress Note  06/24/2015 12:46 PM BERNIE FOBES  MRN:  [id number]   Subjective:   Patient continues to present depressed, irritable , and focused on pain issues. States she has a migraine today. She denies medication side effects, but feels they are not helping much.     Objective:  I have discussed case with treatment team and have met with patient. With patient's express consent, today we had family meeting with her adult sons ( one physically present , other son available teleconferencing, CSW, Probation officer , and later son, CSW, Probation officer met with patient)  Of note, sons state that patient has been functioning poorly prior to admission, and state that her apartment ( lives alone ) was in such disarray and state of disrepair it appeared initially that they were going to have to close it and also evict attached neighbor . Sons state for example  " there was rotting food, unwashed clothes and plates "  Sons also state that patient ADLs have been very poor, going " days without washing or bathing". They also express concern because of poor money, financial decisions, and son states that " she has spent or lost thousands of dollars we cannot account for". Sons report that although current irritable, negative presentation represents an exacerbation, that" she has  A long history of being this way, and trying to manipulate [name] to get what she wants ". State that she has in the past threatened suicide if her requests to them not met . Sons report patient has history of misusing opiate medications in the past .  As above, family states that patient would not be able to function living independently at home at this time. We reviewed this with patient- she states she would consider a temporary referral to a Nursing Home or similar, but " just for a short period of time". Of note, when son made attempts to convince her to go to a  [place]  she stated " people do not understand me, I would  end up killing myself ".   Patient continues to present intermittently tearful, irritable, focused on pain. Denies medication side effects, but states they " are not helping enough".      Principal Problem: MDD (major depressive disorder), recurrent episode, severe ([place]) Diagnosis:   Patient Active Problem List   Diagnosis Date Noted  . Severe episode of recurrent major depressive disorder, without psychotic features ([place]) [F33.2]   . Back pain [M54.9] 06/20/2015  . MDD (major depressive disorder), recurrent episode, severe ([place]) [F33.2] 06/16/2015  . Lumbar stenosis [M48.06] 04/19/2015  . Hypokalemia [E87.6] 09/19/2014  . Constipation [K59.00] 09/19/2014  . Acute encephalopathy [G93.40] 09/18/2014  . Acute respiratory failure ([place]) [J96.00] 09/17/2014  . OSA (obstructive sleep apnea) [G47.33] 09/17/2014  . Ankle fracture, lateral malleolus, closed [S82.63XA] 09/14/2014  . Major depressive disorder, recurrent severe without psychotic features ([place]) [F33.2] 08/07/2014  . Insomnia [G47.00] 08/05/2014  . Hip joint painful on movement [M25.559] 08/05/2014  . Degenerative disc disease, lumbar [M51.36] 08/05/2014  . Severe obesity (BMI >= 40) ([place]) [E66.01] 01/11/2014  . Hyperlipemia [E78.5]   . Depression [F32.9]   . Arthritis [M19.90]   . Polyneuropathy in other diseases classified elsewhere ([place]) [G63] 04/08/2013  . Restless legs syndrome (RLS) [G25.81] 04/08/2013  . UTI (lower urinary tract infection) [N39.0] 10/14/2011  . Dizziness [R42] 10/12/2011  . Fall [W19.XXXA] 10/12/2011  .  TIA (transient ischemic attack) [G45.9] 10/12/2011  . Cellulitis [L03.90] 10/12/2011  . HTN (hypertension) [I10] 10/12/2011  . GERD (gastroesophageal reflux disease) [K21.9] 10/12/2011   Total Time spent with patient:  40 minutes - more than 50 % of session spent on disposition planning discussion and on therapy  Past Psychiatric  History: SEE ABOVE   Past Medical History:  Past Medical History  Diagnosis Date  . GERD (gastroesophageal reflux disease)     hx pud '98  . Arthritis     djd  . Restless leg syndrome     takes Sinemet daily  . 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 Dysrhythmia     hx tachy palpitations  . Neuromuscular disorder ([place])     peripheral neuropathy, FEET AND LEGS  . Stroke [facility])     ? 6 months ago - tests okay - [facility]   . Cellulitis     10/11/11 hospitalized for cellulitis   . Hyperlipemia     takes Atorvastatin daily  . Peripheral neuropathy ([place])   . Elbow fracture, left April 2015  . Depression     takes Zoloft daily  . Vertigo     takes Meclizine daily as needed  . Insomnia     takes restoril nightly as needed  . Hypertension     takes HCTZ daily  . Peripheral neuropathy ([facility])     takes Gabapentin daily  . History of bronchitis     many yrs ago  . Headache(784.0)     migraines yrs ago-takes Imitrex daily  . Joint pain   . Joint swelling   . Chronic back pain     scoliosis/stenosis  . Anxiety     takes Ativan daily as needed  . Cataracts, bilateral     immature   . Depression     Past Surgical History  Procedure Laterality Date  . Cervical disc surgery x2  2011  . Rt carotid enarterectomy  2011  . Rt knee arthroscopy  '99  . Left knee arthroscopy  2001  . Hematoma evacuation  2011    s/p rt cea  . Joint replacement  '01    total knee replacement, LEFT  . Abdominal hysterectomy  1975    bil oophorectomy  . Back surgery   MANY YRS AGO    laminectomy x3  . Cholecystectomy  1993  . Total knee arthroplasty  09/13/2011    Procedure: TOTAL KNEE ARTHROPLASTY;  Surgeon: Magnus Sinning, MD;  Location: [facility];  Service: Orthopedics;  Laterality: Right;  . Right knee replacement      09/2011   . Carpal tunnel release  07/09/2012    Procedure: CARPAL TUNNEL RELEASE;  Surgeon: Magnus Sinning, MD;  Location: [facility];  Service: Orthopedics;  Laterality:  Left;  . Finger arthroplasty  07/09/2012    Procedure: FINGER ARTHROPLASTY;  Surgeon: Magnus Sinning, MD;  Location: [facility];  Service: Orthopedics;  Laterality: Left;  Interposition Arthroplasty [facility] Joint Thumb Left   . Lipoma excision  07/09/2012    Procedure: EXCISION LIPOMA;  Surgeon: Magnus Sinning, MD;  Location: [facility];  Service: Orthopedics;  Laterality: Left;  Excision Lipoma Dorsum Left Wrist   . Orif ankle fracture Right 09/14/2014    FIBULA   . Orif ankle fracture Right 09/14/2014    Procedure: OPEN REDUCTION INTERNAL FIXATION (ORIF) RIGHT ANKLE FRACTURE/SYNDESMOSIS ;  Surgeon: Wylene Simmer, MD;  Location: [facility];  Service: Orthopedics;  Laterality: Right;  . Colonosocpy    . Esophagogastroduodenoscopy    . Lumbar laminectomy with coflex 1 level Bilateral 04/19/2015    Procedure: LUMBAR TWO-THREE LUMBAR LAMINECTOMY WITH COFLEX ;  Surgeon: Kristeen Miss, MD;  Location: [facility];  Service: Neurosurgery;  Laterality: Bilateral;  Bilateral L23 laminectomy and foraminotomy with coflex   Family History:  Family History  Problem Relation Age of Onset  . Congestive Heart Failure Father   . Congestive Heart Failure Sister   . Alzheimer's disease Mother   . Diabetes Brother   . Arthritis Brother    Family Psychiatric  History: SEE ABOVE Social History:  History  Alcohol Use No     History  Drug Use No    Social History   Social History  . Marital Status: Widowed    Spouse Name: N/A  . Number of Children: 2  . Years of Education: 63   Social History Main Topics  . Smoking status: Never Smoker   . Smokeless tobacco: Never Used  . Alcohol Use: No  . Drug Use: No  . Sexual Activity: Yes    Birth Control/ Protection: Surgical   Other Topics Concern  . None   Social History Narrative   Patient is widowed and lives alone.   Patient has two sons.   Patient is retired.   Patient has a college education.   Patient is right-handed.   Patient drinks three glasses of  Coke-soda daily.         Additional Social History:   Sleep:  Fair, partially improved   Appetite:   Fair - improved PO intake   Current Medications: Current Facility-Administered Medications  Medication Dose Route Frequency Provider Last Rate Last Dose  . aspirin tablet 325 mg  325 mg Oral Daily Delfin Gant, NP   325 mg at 06/24/15 0834  . atorvastatin (LIPITOR) tablet 40 mg  40 mg Oral Daily Delfin Gant, NP   40 mg at 06/24/15 0835  . calcium carbonate (OS-CAL - dosed in mg of elemental calcium) tablet 500 mg of elemental calcium  1 tablet Oral Q breakfast Delfin Gant, NP   500 mg of elemental calcium at 06/24/15 0834  . doxycycline (VIBRA-TABS) tablet 100 mg  100 mg Oral Q12H Theodis Blaze, MD   100 mg at 06/24/15 0835  . DULoxetine (CYMBALTA) DR capsule 60 mg  60 mg Oral Daily Jenne Campus, MD   60 mg at 06/24/15 0835  . feeding supplement (ENSURE ENLIVE) (ENSURE ENLIVE) liquid 237 mL  237 mL Oral Q24H Maricela Bo Ost[place], RD   237 mL at 06/21/15 1048  . gabapentin (NEURONTIN) capsule 600 mg  600 mg Oral QID Jenne Campus, MD   600 mg at 06/24/15 1147  . HYDROcodone-acetaminophen (NORCO/VICODIN) 5-325 MG per tablet 1 tablet  1 tablet Oral Q4H PRN Jenne Campus, MD   1 tablet at 06/24/15 1240  . lidocaine (LIDODERM) 5 % 1 patch  1 patch Transdermal Daily Delfin Gant, NP   1 patch at 06/23/15 0840  . loratadine (CLARITIN) tablet 10 mg  10 mg Oral Daily Encarnacion Slates, NP   10 mg at 06/24/15 1145  . LORazepam (ATIVAN) tablet 0.5 mg  0.5 mg Oral Q12H PRN Jenne Campus, MD   0.5 mg at 06/24/15 4268  . meclizine (ANTIVERT) tablet 25 mg  25 mg Oral Daily PRN Delfin Gant, NP      .  ondansetron (ZOFRAN-ODT) disintegrating tablet 4 mg  4 mg Oral Q8H PRN Jenne Campus, MD   4 mg at 06/22/15 0757  . potassium chloride (K-DUR,KLOR-CON) CR tablet 10 mEq  10 mEq Oral Daily Theodis Blaze, MD   10 mEq at 06/24/15 0834  . pyridOXINE (VITAMIN B-6) tablet 50  mg  50 mg Oral Daily Delfin Gant, NP   50 mg at 06/24/15 0834  . SUMAtriptan (IMITREX) tablet 50 mg  50 mg Oral Q2H PRN Delfin Gant, NP   50 mg at 06/24/15 1005  . temazepam (RESTORIL) capsule 7.5 mg  7.5 mg Oral QHS PRN Harriet [place]te, NP   7.5 mg at 06/23/15 2242    Lab Results:  Results for orders placed or performed during the hospital encounter of 06/16/15 (from the past 48 hour(s))  Basic metabolic panel     Status: Abnormal   Collection Time: 06/23/15  6:45 AM  Result Value Ref Range   Sodium 140 135 - 145 mmol/L   Potassium 3.7 3.5 - 5.1 mmol/L   Chloride 102 101 - 111 mmol/L   CO2 28 22 - 32 mmol/L   Glucose, Bld 98 65 - 99 mg/dL   BUN 25 (H) 6 - 20 mg/dL   Creatinine, Ser 0.89 0.44 - 1.00 mg/dL   Calcium 9.8 8.9 - 10.3 mg/dL   GFR calc non Af Amer >60 >60 mL/min   GFR calc Af Amer >60 >60 mL/min    Comment: (NOTE) The eGFR has been calculated using the CKD EPI equation. This calculation has not been validated in all clinical situations. eGFR's persistently <60 mL/min signify possible Chronic Kidney Disease.    Anion gap 10 5 - 15    Comment: Performed at [facility]  CBC with Differential/Platelet     Status: None   Collection Time: 06/23/15  6:45 AM  Result Value Ref Range   WBC 6.5 4.0 - 10.5 K/uL   RBC 4.12 3.87 - 5.11 MIL/uL   Hemoglobin 12.4 12.0 - 15.0 g/dL   HCT 40.0 36.0 - 46.0 %   MCV 97.1 78.0 - 100.0 fL   MCH 30.1 26.0 - 34.0 pg   MCHC 31.0 30.0 - 36.0 g/dL   RDW 13.7 11.5 - 15.5 %   Platelets 321 150 - 400 K/uL   Neutrophils Relative % 36 %   Neutro Abs 2.3 1.7 - 7.7 K/uL   Lymphocytes Relative 51 %   Lymphs Abs 3.4 0.7 - 4.0 K/uL   Monocytes Relative 9 %   Monocytes Absolute 0.6 0.1 - 1.0 K/uL   Eosinophils Relative 3 %   Eosinophils Absolute 0.2 0.0 - 0.7 K/uL   Basophils Relative 1 %   Basophils Absolute 0.0 0.0 - 0.1 K/uL    Comment: Performed at [facility]    Physical Findings: AIMS:  Facial and Oral Movements Muscles of Facial Expression: None, normal Lips and Perioral Area: None, normal Jaw: None, normal Tongue: None, normal,Extremity Movements Upper (arms, wrists, hands, fingers): None, normal Lower (legs, knees, ankles, toes): None, normal, Trunk Movements Neck, shoulders, hips: None, normal, Overall Severity Severity of abnormal movements (highest score from questions above): None, normal Incapacitation due to abnormal movements: None, normal Patient's awareness of abnormal movements (rate only patient's report): No Awareness, Dental Status Current problems with teeth and/or dentures?: No Does patient usually wear dentures?: No  CIWA:    COWS:     Musculoskeletal: Strength & Muscle Tone: within  normal limits Gait & Station: unsteady patient use walker/wheelchaire for ambulation assistances Patient leans: N/A  Psychiatric Specialty Exam: Review of Systems  Constitutional: Negative.   HENT: Negative.   Eyes: Negative.   Musculoskeletal: Positive for back pain.       Leg pain 10/10  Skin: Negative.   Neurological: Positive for tingling.  Psychiatric/Behavioral: Positive for depression. The patient is nervous/anxious and has insomnia.   All other systems reviewed and are negative. describes chronic back pain and foot pain. Denies chest pain, denies SOB   Blood pressure 134/70, pulse 94, temperature 97.9 F (36.6 C), temperature source Oral, resp. rate 18, height 5' 3"  (1.6 m), weight 196 lb (88.905 kg), SpO2 99 %.Body mass index is 34.73 kg/(m^2).  General Appearance: Fairly Groomed  Engineer, water::   Fair   Speech:  Clear and Coherent  Volume:  Normal   Mood:   Depressed  Affect:    Constricted,  irritable  Thought Process:   Linear   Orientation:  Full (Time, Place, and Person)  Thought Content:  denies hallucinations and does not appear internally preoccupied , no delusions expressed   Suicidal Thoughts: reports suicidal ideations contingent on not  being able to return home in the future, but at this time no active SI.   Homicidal Thoughts:  No  Memory:  Recent and remote grossly intact   Judgement:  fair  Insight:  Fair  Psychomotor Activity:   In bed , up only to go to the bathroom today   Concentration:  Good  Recall:  Good  Fund of Knowledge:Good  Language: Good  Akathisia:  No  Handed:  Right  AIMS (if indicated):     Assets:  Communication Skills Desire for Improvement Social Support  ADL's:  Intact  Cognition: WNL  Sleep:  Number of Hours: 3.25    Assessment -  Today patient presents with worsening depression, irritability. Sons report poor level of independent living functioning prior to admission, and express concern that she would not be able to return home to live independently at this time. They also report  She has a long history of being manipulative , irritable , demanding . Patient continues to focus on pain, and remains in bed, with no milieu participation at this time.  PO intake fair, she is drinking fluids. [name] up for bathroom. Seen by PT and OT Efforts by staff, son to encourage increased participation result in patient reporting worsened pain. Of note, tolerating Vicodin, Lidoderm, Neurontin, Cymbalta well thus far.  We have discussed disposition planning options- patient is agreeing to SNF referral for temporary treatment there.  We have again reviewed possible referral for ECT - patient not interested .     Treatment Plan Summary:  Daily contact with patient to assess and evaluate symptoms and progress in treatment and Medication management  Encourage increased milieu, group participation, in order to work on coping skills and symptom reduction. Continue to encourage , provide support, reassurance Continue CYMBALTA 60   mgrs QDAY for depression/ pain  Continue  NEURONTIN to 600 mgrs QID  for anxiety and pain Continue LIDODERM patch for back pain Continue VICODIN 5/325  mgrs  Q 4 hours  PRN for  pain  Continue TEMAZEPAM 7.5 mgrs QHS PRN [place]  D/C ATIVAN 0.5 mgrs Q 12 hours PRN Patient case reviewed with Dr. Charlies Silvers, Hospitalist. Recommendation to request palliative care consult for goals of care/ assistance with care . Have reviewed case with Nursing staff, team, have discussed  strategies to increase time spent out of bed, milieu participation. Recheck BMP   Neita Garnet , MD   06/24/2015, 12:46 PM

## 2015-06-24 NOTE — Progress Notes (Signed)
D: Pt denies SI/HI/AVH. Pt is pleasant and cooperative. Pt continues to be focused on her pain medications and tonight focused on her bowels. Pt stated she did get up and spend some time in the dayroom. Pt was concerned about her Bp and wanted it to be taken , Bp was WNL upon assessment. Pt stated she has not been on any Bp medication since coming in. Pt was informed to speak with the doctor tomorrow since her Bp was in the normal range this evening.   A: Pt was offered support and encouragement. Pt was given scheduled medications. Pt was encourage to attend groups. Q 15 minute checks were done for safety.   R:. Pt is taking medication. Pt receptive to treatment and safety maintained on unit.

## 2015-06-24 NOTE — Progress Notes (Signed)
Patient did not attend wrap up group. 

## 2015-06-24 NOTE — [facility] Group Notes (Signed)
[facility] LCSW Aftercare Discharge Planning Group Note  06/24/2015 8:45 AM  Pt did not attend, declined invitation.   Peri Maris, [place] 06/24/2015 9:29 AM

## 2015-06-24 NOTE — Plan of Care (Signed)
Problem: Alteration in mood Goal: LTG-Patient reports reduction in suicidal thoughts (Patient reports reduction in suicidal thoughts and is able to verbalize a safety plan for whenever patient is feeling suicidal)  Outcome: Progressing Pt denies SI at this time     

## 2015-06-24 NOTE — Progress Notes (Signed)
D: Pt denies SI/HI/AVH. Pt is pleasant and cooperative. Pt focused on pain medications and her pain.   A: Pt was offered support and encouragement. Pt was given scheduled medications. Pt was encourage to attend groups. Q 15 minute checks were done for safety. Pt educated on CAM to help with her pain.   R: Pt is taking medication. Pt has no complaints.Pt receptive to treatment and safety maintained on unit.

## 2015-06-24 NOTE — [facility] Suicide Risk Assessment (Signed)
[place] INPATIENT:  Family/Significant Other Suicide Prevention Education  Suicide Prevention Education:  Education Completed; [name], Pt's friend [phone],  has been identified by the patient as the family member/significant other with whom the patient will be residing, and identified as the person(s) who will aid the patient in the event of a mental health crisis (suicidal ideations/suicide attempt).  With written consent from the patient, the family member/significant other has been provided the following suicide prevention education, prior to the and/or following the discharge of the patient.  The suicide prevention education provided includes the following:  Suicide risk factors  Suicide prevention and interventions  National Suicide Hotline telephone number  [facility] assessment telephone number  [facility] Emergency Assistance [place] and/or Residential Mobile Crisis Unit telephone number  Request made of family/significant other to:  Remove weapons (e.g., guns, rifles, knives), all items previously/currently identified as safety concern.    Remove drugs/medications (over-the-counter, prescriptions, illicit drugs), all items previously/currently identified as a safety concern.  The family member/significant other verbalizes understanding of the suicide prevention education information provided.  The family member/significant other agrees to remove the items of safety concern listed above.  [name] 06/24/2015, 12:07 PM

## 2015-06-24 NOTE — Progress Notes (Signed)
Recreation Therapy Notes  Date: 01.20.2017 Time: 9:30am Location: [address] Group Room   Group Topic: Stress Management  Goal Area(s) Addresses:  Patient will actively participate in stress management techniques presented during session.   Behavioral Response: Did not attend.   Laureen Ochs [place], LRT/CTRS        Katelyn Broadnax L 06/24/2015 1:59 PM

## 2015-06-24 NOTE — Progress Notes (Signed)
CSW met with Pt's son [name], MD [place], and Pt's son Konrad Dolores via telephone. Both sons expressed concerns about Pt returning home as her recent behaviors and hx indicate that returning home would not be a safe discharge plan. Pt son's expressed concerns with Pt's willingness to complete ADLs, manage finances, and follow-up with treatment plan. When group went to speak with Pt, she continues to be irritable, complaining of a migraine. Pt contines to be focused on pain and reports that this is the cause for her limited engagement in treatment. When discussing the necessity of a transition of care from home a type of assisted living facility or retirement community, Pt was resistant, tearful, and began using manipulative language toward her son. Pt reports that she will kill herself if her son's make her move into a facility. CSW attempted to process with Pt the consequences of being resistant to changes that may impact her safety and Pt told CSW to quit talking. Pt's son left the room angry at Pt as she continued to manipulate him, stating "If you stay to visit I guess you will just tell me how I've ruined your life like you did before." At this point, Pt's son left saying he was done and did not return. Pt continued to blame staff for her situation, focusing on having too much pain to function.  Peri Maris, [place] Work [phone]

## 2015-06-24 NOTE — [facility] Group Notes (Signed)
[facility] LCSW Group Therapy 06/24/2015  1:15 PM   Type of Therapy: Group Therapy  Participation Level: Did Not Attend. Patient invited to participate but declined.   Madaleine Simmon, MSW, LCSWA Clinical Social Worker [facility] [phone]   

## 2015-06-24 NOTE — Evaluation (Signed)
Occupational Therapy Evaluation Patient Details Name: [name] MRN: [id number] DOB: August 25, 1940 Today's Date: 06/24/2015    History of Present Illness 73 you who presented to ED with depression, crying . Pt today states she has pain in her ankle and her back, and fearful of falling for she hasn't been up a lot lately and her R knee gives way at times. PMH: 04/20/2015 L2-L3  lumbar laminectomy decompression surgery by Dr. Ellene Route, then DC to [facility] for Short Term rehab until 05/09/2015. Then she DC home at that time. PMH also with 09/2014 R ankle ORIF by Dr. 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    Clinical Impression   This 75 year old female was admitted to [name] with major depression.  Of note, she had recent back sx and had R ankle sx last April.  Evaluation was limited due to pain today. Will return for follow up visit to see if pt can perform ADLs at mod I level.  Currently, she is at supervision/set up    Follow Up Recommendations  Supervision - Intermittent (to determine further OT services on next visit)    Equipment Recommendations  None recommended by OT    Recommendations for Other Services       Precautions / Restrictions Precautions Precautions: Back      Mobility Bed Mobility Overal bed mobility: Modified Independent                Transfers                 General transfer comment: did not stand due to pain:  was mod I with PT yesterday for sit to stand    Balance                                            ADL                                         General ADL Comments: evaluation was limited today due to pain.  Pt able to dress herself with set up; did not work on retrieving clothes as pain was too great.  Pt states that she sponge bathes in between showering. Her w/c will fit through her door.  Will plan to have her retrieve clothing and do toilet  transfer on next visit.     Vision     Perception     Praxis      Pertinent Vitals/Pain Pain Assessment: 0-10 Pain Score: 10-Worst pain ever Pain Location: foot pain Pain Intervention(s): Limited activity within patient's tolerance;Monitored during session;Repositioned;Patient requesting pain meds-RN notified     Hand Dominance     Extremity/Trunk Assessment Upper Extremity Assessment Upper Extremity Assessment: Overall WFL for tasks assessed           Communication Communication Communication: No difficulties   Cognition Arousal/Alertness: Awake/alert Behavior During Therapy: WFL for tasks assessed/performed Overall Cognitive Status: Within Functional Limits for tasks assessed                     General Comments       Exercises       Shoulder Instructions      Home Living Family/patient  expects to be discharged to:: Private residence Living Arrangements: Alone Available Help at Discharge: Family;Friend(s)               Bathroom Shower/Tub: Tub/shower unit;Walk-in shower Shower/tub characteristics: Curtain Biochemist, clinical: Handicapped height     Home Equipment: Environmental consultant - 2 wheels;Walker - 4 wheels;Shower seat;Shower seat - built in;Wheelchair - manual (tub seat that goes up and down)   Additional Comments: pt has lifeline      Prior Functioning/Environment Level of Independence: Independent with assistive device(s)             OT Diagnosis: Acute pain   OT Problem List: Decreased activity tolerance;Pain;Decreased knowledge of use of DME or AE   OT Treatment/Interventions: Self-care/ADL training;DME and/or AE instruction;Patient/family education    OT Goals(Current goals can be found in the care plan section) Acute Rehab OT Goals Patient Stated Goal: decreased pain OT Goal Formulation: With patient Time For Goal Achievement: 07/01/15 Potential to Achieve Goals: Good ADL Goals Pt Will Transfer to Toilet: with modified  independence;ambulating;bedside commode (vs using w/c) Additional ADL Goal #1: pt will perform ADL at mod I level including clothing retrieval with reacher  OT Frequency: Min 2X/week   Barriers to D/C:            Co-evaluation              End of Session    Activity Tolerance: Patient limited by pain Patient left: in bed   Time: [id number] OT Time Calculation (min): 16 min Charges:  OT General Charges $OT Visit: 1 Procedure OT Evaluation $OT Eval Low Complexity: 1 Procedure G-Codes: OT G-codes **NOT FOR INPATIENT CLASS** Functional Assessment Tool Used: clinical judgment and observation Functional Limitation: Self care Self Care Current Status [id number]): At least 1 percent but less than 20 percent impaired, limited or restricted Self Care Goal Status [id number]): 0 percent impaired, limited or restricted  Treyson Axel 06/24/2015, 8:23 AM  Lesle Chris, OTR/L [phone] 06/24/2015

## 2015-06-25 LAB — BASIC METABOLIC PANEL
ANION GAP: 12 (ref 5–15)
BUN: 25 mg/dL — ABNORMAL HIGH (ref 6–20)
CALCIUM: 9.3 mg/dL (ref 8.9–10.3)
CO2: 24 mmol/L (ref 22–32)
CREATININE: 0.91 mg/dL (ref 0.44–1.00)
Chloride: 101 mmol/L (ref 101–111)
Glucose, Bld: 124 mg/dL — ABNORMAL HIGH (ref 65–99)
Potassium: 4.1 mmol/L (ref 3.5–5.1)
Sodium: 137 mmol/L (ref 135–145)

## 2015-06-25 LAB — CBC WITH DIFFERENTIAL/PLATELET
BASOS ABS: 0.1 10*3/uL (ref 0.0–0.1)
BASOS PCT: 1 %
EOS ABS: 0.2 10*3/uL (ref 0.0–0.7)
EOS PCT: 3 %
HEMATOCRIT: 40 % (ref 36.0–46.0)
HEMOGLOBIN: 13 g/dL (ref 12.0–15.0)
Lymphocytes Relative: 42 %
Lymphs Abs: 3.6 10*3/uL (ref 0.7–4.0)
MCH: 30.7 pg (ref 26.0–34.0)
MCHC: 32.5 g/dL (ref 30.0–36.0)
MCV: 94.3 fL (ref 78.0–100.0)
Monocytes Absolute: 0.6 10*3/uL (ref 0.1–1.0)
Monocytes Relative: 7 %
NEUTROS PCT: 48 %
Neutro Abs: 4.1 10*3/uL (ref 1.7–7.7)
Platelets: 326 10*3/uL (ref 150–400)
RBC: 4.24 MIL/uL (ref 3.87–5.11)
RDW: 13.8 % (ref 11.5–15.5)
WBC: 8.6 10*3/uL (ref 4.0–10.5)

## 2015-06-25 MED ORDER — SALINE SPRAY 0.65 % NA SOLN
1.0000 | NASAL | Status: DC | PRN
  Administered 2015-06-26 – 2015-06-28 (×4): 1 via NASAL

## 2015-06-25 MED ORDER — BISACODYL 10 MG RE SUPP
10.0000 mg | Freq: Once | RECTAL | Status: AC
  Administered 2015-06-25: 10 mg via RECTAL
  Filled 2015-06-25 (×2): qty 1

## 2015-06-25 MED ORDER — LISINOPRIL 5 MG PO TABS
5.0000 mg | ORAL_TABLET | Freq: Every day | ORAL | Status: DC
Start: 2015-06-25 — End: 2015-06-29
  Administered 2015-06-25 – 2015-06-28 (×4): 5 mg via ORAL
  Filled 2015-06-25 (×7): qty 1

## 2015-06-25 MED ORDER — SALINE SPRAY 0.65 % NA SOLN
NASAL | Status: AC
Start: 2015-06-25 — End: 2015-06-25
  Administered 2015-06-25: 23:00:00
  Filled 2015-06-25: qty 44

## 2015-06-25 MED ORDER — DOCUSATE SODIUM 50 MG PO CAPS
50.0000 mg | ORAL_CAPSULE | Freq: Two times a day (BID) | ORAL | Status: DC
  Filled 2015-06-25 (×4): qty 1

## 2015-06-25 NOTE — Progress Notes (Signed)
D.  Pt in bed sleeping on approach, pleasant upon awakening but with complaint of continued pain.  Denies other complaints at this time.  Pt did not feel well enough to attend evening wrap up group.  Pt states she has had a bad day, with quite a bit of pain.  Pt asked to be awakened for her pain medication during night because if she does not keep a steady level of it in her system she will be in much more discomfort when she does receive it.  This makes it harder for her to remain as comfortable as is possible.  Pt is status post eight back surgeries.  Pt also mentioned nasal stuffiness and difficulty breathing through her nose.  Pt denies SI/HI/hallucinations at this time.  A.  Support and encouragement offered, medication given as ordered and NP notified of nasal congestion.  Order received for Saline Nasal Spray and Pt pleased with this.  Heat packs given as requested.  R.  Pt remains safe in bed, will continue to monitor.

## 2015-06-25 NOTE — Progress Notes (Signed)
Adult Psychoeducational Group Note  Date:  06/25/2015 Time:  8:15 PM  Group Topic/Focus:  Wrap-Up Group:   The focus of this group is to help patients review their daily goal of treatment and discuss progress on daily workbooks.  Participation Level:  Did Not Attend  Pt was asleep during wrap-up group.    Lincoln Brigham 06/25/2015, 8:43 PM

## 2015-06-25 NOTE — [facility] Group Notes (Signed)
[name]  06/25/2015 10 AM  Type of Therapy:  Group Therapy  Participation Level:  Did Not Attend although patient was invited   [name]

## 2015-06-25 NOTE — Progress Notes (Addendum)
Patient ID: [name], female   DOB: 07-21-1940, 75 y.o.   MRN: [id number]  Pt currently presents with a flat affect and depressed behavior. Per self inventory, pt rates depression at a 1, hopelessness 8 (reports she doesn't want to go to an assisted living center because she doesn't want to give away her cat) and anxiety 8. Pt's daily goal is to "getting up, getting dressed, getting a tub bath" and they intend to do so by "just do it." Pt reports poor sleep, a good appetite, low to normal energy and good concentration. Pt less anxious today, affect brighter. Pt complains of constipation, reports she has not had a bowel movement in four days.  Pt provided with medications per providers orders. Pt's labs and vitals were monitored throughout the day. Pt supported emotionally and encouraged to express concerns and questions. Pt educated on medications. Md notified of trending hypertension and constipation. Deep breathing, progressive relaxation and guided imagery implemented with pt, no relief noted.   Pt's safety ensured with 15 minute and environmental checks. Pt currently denies SI/HI and A/V hallucinations. Pt verbally agrees to seek staff if SI/HI or A/VH occurs and to consult with staff before acting on these thoughts. Pt remains in bed all day today, reports no pain relief and refuses to get out of bed stating "I just don't feel good." Will continue POC.

## 2015-06-25 NOTE — Progress Notes (Signed)
Palliative medicine consult received and I have reviewed Ms. Kyne's record in detail. She has long standing psychiatric disease documented in the medical record back to 2009- multiple psychiatric hospitalizations many in the setting of SA/benzodiazepine overdose. Her psychiatric problems pre-date her husbands death by several years- and there may be additional complex grief issues. She has multiple surgeries 10+ in the past mostly orthopedic and many related to injuries sustained in falls or related to her chronic pain. [name] a retired Therapist, sports. Her medical record shows that she has an established relationship with her PCP Hollace Kinnier and frequently has visits for non specific symptoms, early requests for opiate refills- I cannot find any references for MCI or dementia work-up but I assume given her prior geriatric psych hospitalizations that this has been done. History would suggest she may be having signs of vascular dementia compounded by her depression and needs full evaluation .I also note that she was on Sinemet/Carbidopa for many years and this was recently discontinued- unclear if this was for restless leg or for an actual diagnosis of Parkinson's Disease. Her chronic pain issues need careful prescription drug monitoring - review of [place] database suggests multiple prescribers and misuse patterns which may be pseudo addiction if she has uncontrolled pain and this needs oversight in the oitpatient setting.  Palliative medicine is most appropriate for people who have life threatening disease, chronic disease with a progressive course or any serious illness that may limit their life expectancy. Ms. Araki depression and uncontrolled psychiatric disease may indeed be eventually terminal and she is clearly suffering, but without a clear diagnosis of dementia or parkinson's disease and current psychiatric instability w/prior SI our inpatient team will likely not at this juncture be able to effectively provide  consultation. If she stabilizes from a psychiatric standpoint and discharges to a skilled nursing home please request an outpatient palliative consultation.  Recommendations:  1. Outpatient Palliative Consult if she discharges to a SNF- begin to clarify her goals of care-Advance Care Planning 2. Geriatric Psych and Dementia evaluation, clarifications on Parkinson's disease vs. Polypharmacy 3. 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 Pain management needs to be handled by one provider- recommend contacting Dr. Mariea Clonts PCP  At this time not appropriate for inpatient palliative consultation.  Please call if her condition changes or deteriorates and requires inpatient palliative consultation. [place], [place]

## 2015-06-25 NOTE — Progress Notes (Signed)
Patient ID: [name], female   DOB: 06/06/1940, 75 y.o.   MRN: [id number] [facility] MD Progress Note  06/25/2015 5:27 PM [name]  MRN:  [id number]   Subjective:   Today continues to report chronic pain, but presents with a partially improved mood and affect. She denies medication side effects. Reports constipation , although continues to report chronic pain she seems less intensely focused on this issue today.   Objective:  I have discussed case with treatment team and have met with patient. As discussed with Nursing staff , presentation has been variable- earlier  did , with some staff encouragement, dress and came out of her room, sat in day room for a period of time. Later , however, she went back to bed complaining of pain, constipation. Although presentation has been variable, as above, there has been a trend towards some improvement and as noted, patient now getting out of bed more and starting to be visible in milieu at times . Today reporting ongoing depression, but does state she is " a little better". Irritability, although still present has decreased  . As discussed with staff, RN, patient states she is reluctant to go to a SNF due to wanting to preserve her independence as much as possible and because she does not want to lose her pet cat . Labs reviewed- BMP  BUN 25, Creatinine 0.91, GFR > 60 ,  Glucose 124 . CBC unremarkable .    Principal Problem: MDD (major depressive disorder), recurrent episode, severe ([place]) Diagnosis:   Patient Active Problem List   Diagnosis Date Noted  . Severe episode of recurrent major depressive disorder, without psychotic features ([place]) [F33.2]   . Back pain [M54.9] 06/20/2015  . MDD (major depressive disorder), recurrent episode, severe ([place]) [F33.2] 06/16/2015  . Lumbar stenosis [M48.06] 04/19/2015  . Hypokalemia [E87.6] 09/19/2014  . Constipation [K59.00] 09/19/2014  . Acute encephalopathy [G93.40] 09/18/2014  . Acute respiratory  failure ([place]) [J96.00] 09/17/2014  . OSA (obstructive sleep apnea) [G47.33] 09/17/2014  . Ankle fracture, lateral malleolus, closed [S82.63XA] 09/14/2014  . Major depressive disorder, recurrent severe without psychotic features ([place]) [F33.2] 08/07/2014  . Insomnia [G47.00] 08/05/2014  . Hip joint painful on movement [M25.559] 08/05/2014  . Degenerative disc disease, lumbar [M51.36] 08/05/2014  . Severe obesity (BMI >= 40) ([place]) [E66.01] 01/11/2014  . Hyperlipemia [E78.5]   . Depression [F32.9]   . Arthritis [M19.90]   . Polyneuropathy in other diseases classified elsewhere ([place]) [G63] 04/08/2013  . Restless legs syndrome (RLS) [G25.81] 04/08/2013  . UTI (lower urinary tract infection) [N39.0] 10/14/2011  . Dizziness [R42] 10/12/2011  . Fall [W19.XXXA] 10/12/2011  . TIA (transient ischemic attack) [G45.9] 10/12/2011  . Cellulitis [L03.90] 10/12/2011  . HTN (hypertension) [I10] 10/12/2011  . GERD (gastroesophageal reflux disease) [K21.9] 10/12/2011   Total Time spent with patient:  40 minutes - more than 50 % of session spent on disposition planning discussion and on therapy  Past Psychiatric History: SEE ABOVE   Past Medical History:  Past Medical History  Diagnosis Date  . GERD (gastroesophageal reflux disease)     hx pud '98  . Arthritis     djd  . Restless leg syndrome     takes Sinemet daily  . 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 Dysrhythmia     hx tachy palpitations  . Neuromuscular disorder ([place])     peripheral neuropathy, FEET AND LEGS  . Stroke [facility])     ?  6 months ago - tests okay - [facility]   . Cellulitis     10/11/11 hospitalized for cellulitis   . Hyperlipemia     takes Atorvastatin daily  . Peripheral neuropathy ([place])   . Elbow fracture, left April 2015  . Depression     takes Zoloft daily  . Vertigo     takes Meclizine daily as needed  . Insomnia     takes restoril nightly as needed  . Hypertension     takes HCTZ daily  . Peripheral  neuropathy ([facility])     takes Gabapentin daily  . History of bronchitis     many yrs ago  . Headache(784.0)     migraines yrs ago-takes Imitrex daily  . Joint pain   . Joint swelling   . Chronic back pain     scoliosis/stenosis  . Anxiety     takes Ativan daily as needed  . Cataracts, bilateral     immature   . Depression     Past Surgical History  Procedure Laterality Date  . Cervical disc surgery x2  2011  . Rt carotid enarterectomy  2011  . Rt knee arthroscopy  '99  . Left knee arthroscopy  2001  . Hematoma evacuation  2011    s/p rt cea  . Joint replacement  '01    total knee replacement, LEFT  . Abdominal hysterectomy  1975    bil oophorectomy  . Back surgery   MANY YRS AGO    laminectomy x3  . Cholecystectomy  1993  . Total knee arthroplasty  09/13/2011    Procedure: TOTAL KNEE ARTHROPLASTY;  Surgeon: Magnus Sinning, MD;  Location: [facility];  Service: Orthopedics;  Laterality: Right;  . Right knee replacement      09/2011   . Carpal tunnel release  07/09/2012    Procedure: CARPAL TUNNEL RELEASE;  Surgeon: Magnus Sinning, MD;  Location: [facility];  Service: Orthopedics;  Laterality: Left;  . Finger arthroplasty  07/09/2012    Procedure: FINGER ARTHROPLASTY;  Surgeon: Magnus Sinning, MD;  Location: [facility];  Service: Orthopedics;  Laterality: Left;  Interposition Arthroplasty [facility] Joint Thumb Left   . Lipoma excision  07/09/2012    Procedure: EXCISION LIPOMA;  Surgeon: Magnus Sinning, MD;  Location: [facility];  Service: Orthopedics;  Laterality: Left;  Excision Lipoma Dorsum Left Wrist   . Orif ankle fracture Right 09/14/2014    FIBULA   . Orif ankle fracture Right 09/14/2014    Procedure: OPEN REDUCTION INTERNAL FIXATION (ORIF) RIGHT ANKLE FRACTURE/SYNDESMOSIS ;  Surgeon: Wylene Simmer, MD;  Location: [place];  Service: Orthopedics;  Laterality: Right;  . Colonosocpy    . Esophagogastroduodenoscopy    . Lumbar laminectomy with coflex 1 level Bilateral 04/19/2015    Procedure:  LUMBAR TWO-THREE LUMBAR LAMINECTOMY WITH COFLEX ;  Surgeon: Kristeen Miss, MD;  Location: [facility];  Service: Neurosurgery;  Laterality: Bilateral;  Bilateral L23 laminectomy and foraminotomy with coflex   Family History:  Family History  Problem Relation Age of Onset  . Congestive Heart Failure Father   . Congestive Heart Failure Sister   . Alzheimer's disease Mother   . Diabetes Brother   . Arthritis Brother    Family Psychiatric  History: SEE ABOVE Social History:  History  Alcohol Use No     History  Drug Use No    Social History   Social History  . Marital Status: Widowed    Spouse Name: N/A  .  Number of Children: 2  . Years of Education: 97   Social History Main Topics  . Smoking status: Never Smoker   . Smokeless tobacco: Never Used  . Alcohol Use: No  . Drug Use: No  . Sexual Activity: Yes    Birth Control/ Protection: Surgical   Other Topics Concern  . None   Social History Narrative   Patient is widowed and lives alone.   Patient has two sons.   Patient is retired.   Patient has a college education.   Patient is right-handed.   Patient drinks three glasses of Coke-soda daily.         Additional Social History:   Sleep:  Fair, partially improved   Appetite:   Fair - improved PO intake   Current Medications: Current Facility-Administered Medications  Medication Dose Route Frequency Provider Last Rate Last Dose  . aspirin tablet 325 mg  325 mg Oral Daily Delfin Gant, NP   325 mg at 06/25/15 0829  . atorvastatin (LIPITOR) tablet 40 mg  40 mg Oral Daily Delfin Gant, NP   40 mg at 06/25/15 0829  . calcium carbonate (OS-CAL - dosed in mg of elemental calcium) tablet 500 mg of elemental calcium  1 tablet Oral Q breakfast Delfin Gant, NP   500 mg of elemental calcium at 06/25/15 0829  . docusate sodium (COLACE) capsule 50 mg  50 mg Oral BID Jenne Campus, MD   50 mg at 06/25/15 1704  . doxycycline (VIBRA-TABS) tablet 100 mg  100  mg Oral Q12H Theodis Blaze, MD   100 mg at 06/25/15 0829  . DULoxetine (CYMBALTA) DR capsule 60 mg  60 mg Oral Daily Jenne Campus, MD   60 mg at 06/25/15 0830  . feeding supplement (ENSURE ENLIVE) (ENSURE ENLIVE) liquid 237 mL  237 mL Oral Q24H Maricela Bo Ost[place], RD   237 mL at 06/21/15 1048  . gabapentin (NEURONTIN) capsule 600 mg  600 mg Oral QID Jenne Campus, MD   600 mg at 06/25/15 1653  . HYDROcodone-acetaminophen (NORCO/VICODIN) 5-325 MG per tablet 1 tablet  1 tablet Oral Q4H PRN Jenne Campus, MD   1 tablet at 06/25/15 1336  . lidocaine (LIDODERM) 5 % 1 patch  1 patch Transdermal Daily Delfin Gant, NP   1 patch at 06/23/15 0840  . lisinopril (PRINIVIL,ZESTRIL) tablet 5 mg  5 mg Oral Daily Jenne Campus, MD   5 mg at 06/25/15 1653  . loratadine (CLARITIN) tablet 10 mg  10 mg Oral Daily Encarnacion Slates, NP   10 mg at 06/25/15 0829  . meclizine (ANTIVERT) tablet 25 mg  25 mg Oral Daily PRN Delfin Gant, NP   25 mg at 06/24/15 2155  . ondansetron (ZOFRAN-ODT) disintegrating tablet 4 mg  4 mg Oral Q8H PRN Jenne Campus, MD   4 mg at 06/25/15 1148  . potassium chloride (K-DUR,KLOR-CON) CR tablet 10 mEq  10 mEq Oral Daily Theodis Blaze, MD   10 mEq at 06/25/15 0829  . pyridOXINE (VITAMIN B-6) tablet 50 mg  50 mg Oral Daily Delfin Gant, NP   50 mg at 06/25/15 0829  . SUMAtriptan (IMITREX) tablet 50 mg  50 mg Oral Q2H PRN Delfin Gant, NP   50 mg at 06/25/15 0222  . temazepam (RESTORIL) capsule 7.5 mg  7.5 mg Oral QHS PRN Harriet Butte, NP   7.5 mg at 06/24/15 2116  Lab Results:  Results for orders placed or performed during the hospital encounter of 06/16/15 (from the past 48 hour(s))  Basic metabolic panel     Status: Abnormal   Collection Time: 06/25/15  6:56 AM  Result Value Ref Range   Sodium 137 135 - 145 mmol/L   Potassium 4.1 3.5 - 5.1 mmol/L   Chloride 101 101 - 111 mmol/L   CO2 24 22 - 32 mmol/L   Glucose, Bld 124 (H) 65 - 99 mg/dL   BUN  25 (H) 6 - 20 mg/dL   Creatinine, Ser 0.91 0.44 - 1.00 mg/dL   Calcium 9.3 8.9 - 10.3 mg/dL   GFR calc non Af Amer >60 >60 mL/min   GFR calc Af Amer >60 >60 mL/min    Comment: (NOTE) The eGFR has been calculated using the CKD EPI equation. This calculation has not been validated in all clinical situations. eGFR's persistently <60 mL/min signify possible Chronic Kidney Disease.    Anion gap 12 5 - 15    Comment: Performed at [facility]  CBC with Differential/Platelet     Status: None   Collection Time: 06/25/15  6:56 AM  Result Value Ref Range   WBC 8.6 4.0 - 10.5 K/uL   RBC 4.24 3.87 - 5.11 MIL/uL   Hemoglobin 13.0 12.0 - 15.0 g/dL   HCT 40.0 36.0 - 46.0 %   MCV 94.3 78.0 - 100.0 fL   MCH 30.7 26.0 - 34.0 pg   MCHC 32.5 30.0 - 36.0 g/dL   RDW 13.8 11.5 - 15.5 %   Platelets 326 150 - 400 K/uL   Neutrophils Relative % 48 %   Neutro Abs 4.1 1.7 - 7.7 K/uL   Lymphocytes Relative 42 %   Lymphs Abs 3.6 0.7 - 4.0 K/uL   Monocytes Relative 7 %   Monocytes Absolute 0.6 0.1 - 1.0 K/uL   Eosinophils Relative 3 %   Eosinophils Absolute 0.2 0.0 - 0.7 K/uL   Basophils Relative 1 %   Basophils Absolute 0.1 0.0 - 0.1 K/uL   Smear Review MORPHOLOGY UNREMARKABLE     Comment: Performed at [facility]    Physical Findings: AIMS: Facial and Oral Movements Muscles of Facial Expression: None, normal Lips and Perioral Area: None, normal Jaw: None, normal Tongue: None, normal,Extremity Movements Upper (arms, wrists, hands, fingers): None, normal Lower (legs, knees, ankles, toes): None, normal, Trunk Movements Neck, shoulders, hips: None, normal, Overall Severity Severity of abnormal movements (highest score from questions above): None, normal Incapacitation due to abnormal movements: None, normal Patient's awareness of abnormal movements (rate only patient's report): No Awareness, Dental Status Current problems with teeth and/or dentures?: No Does  patient usually wear dentures?: No  CIWA:    COWS:     Musculoskeletal: Strength & Muscle Tone: within normal limits Gait & Station: unsteady patient use walker/wheelchaire for ambulation assistances Patient leans: N/A  Psychiatric Specialty Exam: Review of Systems  Constitutional: Negative.   HENT: Negative.   Eyes: Negative.   Musculoskeletal: Positive for back pain.       Leg pain 10/10  Skin: Negative.   Neurological: Positive for tingling.  Psychiatric/Behavioral: Positive for depression. The patient is nervous/anxious and has insomnia.   All other systems reviewed and are negative. describes chronic back pain and foot pain. Denies chest pain, denies SOB   Blood pressure 158/70, pulse 94, temperature 98.3 F (36.8 C), temperature source Oral, resp. rate 16, height 5' 3"  (1.6 m), weight  196 lb (88.905 kg), SpO2 99 %.Body mass index is 34.73 kg/(m^2).  General Appearance: Fairly Groomed- but has improved   Eye Contact::   Fair , but improved compared to prior   Speech:  Clear and Coherent  Volume:  Normal   Mood:   Still depressed, but acknowledges some improvement   Affect:    Still dysphoric, constricted, but to lesser degree   Thought Process:   Linear   Orientation:  Full (Time, Place, and Person)  Thought Content:  denies hallucinations and does not appear internally preoccupied , no delusions expressed   Suicidal Thoughts:  Today denies any active SI, contracts for safety on unit. Reports intermittent passive thoughts about death, also subsiding .  Homicidal Thoughts:  No  Memory:  Recent and remote grossly intact   Judgement:  fair  Insight:  Fair  Psychomotor Activity:   Some improvement , out of bed more often  Concentration:  Good  Recall:  Good  Fund of Knowledge:Good  Language: Good  Akathisia:  No  Handed:  Right  AIMS (if indicated):     Assets:  Communication Skills Desire for Improvement Social Support  ADL's:  Intact  Cognition: WNL  Sleep:   Number of Hours: 3.75    Assessment -   Patient remains irritable, depressed, but there has been some improvement - earlier got out of bed and sat in day room for period of time which represents a major change, since up to then  she has been isolating in room and staying in her bed without any milieu participation. She also presents with a slightly improved range of affect. She continues to report chronic pain, but seems less focused on this . Today complains of constipation. No medication side effects .    Treatment Plan Summary:  Daily contact with patient to assess and evaluate symptoms and progress in treatment and Medication management  Encourage increased milieu, group participation, in order to work on coping skills and symptom reduction. Continue to encourage , provide support, reassurance Continue CYMBALTA 60   mgrs QDAY for depression/ pain  Continue  NEURONTIN to 600 mgrs QID  for anxiety and pain Continue LIDODERM patch for back pain Continue VICODIN 5/325  mgrs  Q 4 hours  PRN for pain  Continue TEMAZEPAM 7.5 mgrs QHS PRN [place]  COLACE/DULCOLAX as needed for constipation. Patient case reviewed with Dr. Charlies Silvers, Hospitalist. Recommendation to request palliative care consult for goals of care/ assistance with care . Have reviewed case with Nursing staff, team, have discussed strategies to increase time spent out of bed, milieu participation.    Neita Garnet , MD   06/25/2015, 5:27 PM

## 2015-06-26 MED ORDER — HYDROCODONE-ACETAMINOPHEN 10-325 MG PO TABS
1.0000 | ORAL_TABLET | Freq: Three times a day (TID) | ORAL | Status: DC | PRN
  Administered 2015-06-26 – 2015-06-28 (×7): 1 via ORAL
  Filled 2015-06-26 (×7): qty 1

## 2015-06-26 MED ORDER — DOCUSATE SODIUM 100 MG PO CAPS
100.0000 mg | ORAL_CAPSULE | Freq: Every day | ORAL | Status: DC
  Administered 2015-06-26 – 2015-06-28 (×3): 100 mg via ORAL
  Filled 2015-06-26 (×5): qty 1

## 2015-06-26 MED ORDER — TRAZODONE HCL 50 MG PO TABS
50.0000 mg | ORAL_TABLET | Freq: Every evening | ORAL | Status: DC | PRN
  Administered 2015-06-26 – 2015-06-27 (×2): 50 mg via ORAL
  Filled 2015-06-26 (×2): qty 1

## 2015-06-26 MED ORDER — HYDROCODONE-ACETAMINOPHEN 7.5-325 MG/15ML PO SOLN: 10.0000 mL | Freq: Four times a day (QID) | ORAL | Status: DC | PRN

## 2015-06-26 MED ORDER — HYDROCODONE-ACETAMINOPHEN 7.5-325 MG PO TABS: 1.0000 | ORAL_TABLET | Freq: Four times a day (QID) | ORAL | Status: DC | PRN

## 2015-06-26 NOTE — Progress Notes (Addendum)
Patient ID: [name], female   DOB: [birth year], 75 y.o.   MRN: [id number] [facility] MD Progress Note  06/26/2015 2:37 PM DEVA RON  MRN:  [id number]   Subjective:   Reports ongoing pain, which she states makes it difficult for her to focus on other things. Describes only partial improvement with current medications. Denies medication side effects. States she is not sleeping well in spite of Temazepam. We have been discussing tapering off BZDs  And today she states she agrees to stop BZDs because " it is not helping anyway".     Objective:  I have discussed case with treatment team and have met with patient. Patient remains labile, and as discussed with RN, presentation is variable, at times smiling and cooperative and at others more irritable , angry. Gradually mood has tended to improve. Today we discussed disposition options - patient states she would consider an ALF as a temporary measure, but would prefer to return home, and states she does not want to give up on her independence if possible . She minimizes sons' concerns about ability to function independently, and states she feels she can .  No disruptive behaviors on unit, going to some groups, gradually able to get out of bed and be visible in milieu with less staff encouragement needed . As noted, continues to focus on severe pain.  She states she has no  History of abusing opiates, states she has taken them as prescribed in the past . We discussed strategies for pain management , will increase Vicodin dose to address ongoing severe pain.     Principal Problem: MDD (major depressive disorder), recurrent episode, severe ([place]) Diagnosis:   Patient Active Problem List   Diagnosis Date Noted  . Severe episode of recurrent major depressive disorder, without psychotic features ([place]) [F33.2]   . Back pain [M54.9] 06/20/2015  . MDD (major depressive disorder), recurrent episode, severe ([place]) [F33.2] 06/16/2015  . Lumbar stenosis  [M48.06] 04/19/2015  . Hypokalemia [E87.6] 09/19/2014  . Constipation [K59.00] 09/19/2014  . Acute encephalopathy [G93.40] 09/18/2014  . Acute respiratory failure ([place]) [J96.00] 09/17/2014  . OSA (obstructive sleep apnea) [G47.33] 09/17/2014  . Ankle fracture, lateral malleolus, closed [S82.63XA] 09/14/2014  . Major depressive disorder, recurrent severe without psychotic features ([place]) [F33.2] 08/07/2014  . Insomnia [G47.00] 08/05/2014  . Hip joint painful on movement [M25.559] 08/05/2014  . Degenerative disc disease, lumbar [M51.36] 08/05/2014  . Severe obesity (BMI >= 40) ([place]) [E66.01] 01/11/2014  . Hyperlipemia [E78.5]   . Depression [F32.9]   . Arthritis [M19.90]   . Polyneuropathy in other diseases classified elsewhere ([place]) [G63] 04/08/2013  . Restless legs syndrome (RLS) [G25.81] 04/08/2013  . UTI (lower urinary tract infection) [N39.0] 10/14/2011  . Dizziness [R42] 10/12/2011  . Fall [W19.XXXA] 10/12/2011  . TIA (transient ischemic attack) [G45.9] 10/12/2011  . Cellulitis [L03.90] 10/12/2011  . HTN (hypertension) [I10] 10/12/2011  . GERD (gastroesophageal reflux disease) [K21.9] 10/12/2011   Total Time spent with patient:   20  Minutes   Past Psychiatric History: SEE ABOVE   Past Medical History:  Past Medical History  Diagnosis Date  . GERD (gastroesophageal reflux disease)     hx pud '98  . Arthritis     djd  . Restless leg syndrome     takes Sinemet daily  . 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 Dysrhythmia     hx tachy palpitations  . Neuromuscular disorder ([place])  peripheral neuropathy, FEET AND LEGS  . Stroke [facility])     ? 6 months ago - tests okay - [facility]   . Cellulitis     10/11/11 hospitalized for cellulitis   . Hyperlipemia     takes Atorvastatin daily  . Peripheral neuropathy ([place])   . Elbow fracture, left April 2015  . Depression     takes Zoloft daily  . Vertigo     takes Meclizine daily as needed  . Insomnia     takes restoril  nightly as needed  . Hypertension     takes HCTZ daily  . Peripheral neuropathy ([facility])     takes Gabapentin daily  . History of bronchitis     many yrs ago  . Headache(784.0)     migraines yrs ago-takes Imitrex daily  . Joint pain   . Joint swelling   . Chronic back pain     scoliosis/stenosis  . Anxiety     takes Ativan daily as needed  . Cataracts, bilateral     immature   . Depression     Past Surgical History  Procedure Laterality Date  . Cervical disc surgery x2  2011  . Rt carotid enarterectomy  2011  . Rt knee arthroscopy  '99  . Left knee arthroscopy  2001  . Hematoma evacuation  2011    s/p rt cea  . Joint replacement  '01    total knee replacement, LEFT  . Abdominal hysterectomy  1975    bil oophorectomy  . Back surgery   MANY YRS AGO    laminectomy x3  . Cholecystectomy  1993  . Total knee arthroplasty  09/13/2011    Procedure: TOTAL KNEE ARTHROPLASTY;  Surgeon: Magnus Sinning, MD;  Location: [facility];  Service: Orthopedics;  Laterality: Right;  . Right knee replacement      09/2011   . Carpal tunnel release  07/09/2012    Procedure: CARPAL TUNNEL RELEASE;  Surgeon: Magnus Sinning, MD;  Location: [facility];  Service: Orthopedics;  Laterality: Left;  . Finger arthroplasty  07/09/2012    Procedure: FINGER ARTHROPLASTY;  Surgeon: Magnus Sinning, MD;  Location: [facility];  Service: Orthopedics;  Laterality: Left;  Interposition Arthroplasty [facility] Joint Thumb Left   . Lipoma excision  07/09/2012    Procedure: EXCISION LIPOMA;  Surgeon: Magnus Sinning, MD;  Location: [facility];  Service: Orthopedics;  Laterality: Left;  Excision Lipoma Dorsum Left Wrist   . Orif ankle fracture Right 09/14/2014    FIBULA   . Orif ankle fracture Right 09/14/2014    Procedure: OPEN REDUCTION INTERNAL FIXATION (ORIF) RIGHT ANKLE FRACTURE/SYNDESMOSIS ;  Surgeon: Wylene Simmer, MD;  Location: [place];  Service: Orthopedics;  Laterality: Right;  . Colonosocpy    . Esophagogastroduodenoscopy    .  Lumbar laminectomy with coflex 1 level Bilateral 04/19/2015    Procedure: LUMBAR TWO-THREE LUMBAR LAMINECTOMY WITH COFLEX ;  Surgeon: Kristeen Miss, MD;  Location: [place] [facility];  Service: Neurosurgery;  Laterality: Bilateral;  Bilateral L23 laminectomy and foraminotomy with coflex   Family History:  Family History  Problem Relation Age of Onset  . Congestive Heart Failure Father   . Congestive Heart Failure Sister   . Alzheimer's disease Mother   . Diabetes Brother   . Arthritis Brother    Family Psychiatric  History: SEE ABOVE Social History:  History  Alcohol Use No     History  Drug Use No    Social History  Social History  . Marital Status: Widowed    Spouse Name: N/A  . Number of Children: 2  . Years of Education: 40   Social History Main Topics  . Smoking status: Never Smoker   . Smokeless tobacco: Never Used  . Alcohol Use: No  . Drug Use: No  . Sexual Activity: Yes    Birth Control/ Protection: Surgical   Other Topics Concern  . None   Social History Narrative   Patient is widowed and lives alone.   Patient has two sons.   Patient is retired.   Patient has a college education.   Patient is right-handed.   Patient drinks three glasses of Coke-soda daily.         Additional Social History:   Sleep:  Fair, partially improved   Appetite:   Fair - improved PO intake   Current Medications: Current Facility-Administered Medications  Medication Dose Route Frequency Provider Last Rate Last Dose  . aspirin tablet 325 mg  325 mg Oral Daily Delfin Gant, NP   325 mg at 06/26/15 0818  . atorvastatin (LIPITOR) tablet 40 mg  40 mg Oral Daily Delfin Gant, NP   40 mg at 06/26/15 0818  . calcium carbonate (OS-CAL - dosed in mg of elemental calcium) tablet 500 mg of elemental calcium  1 tablet Oral Q breakfast Delfin Gant, NP   500 mg of elemental calcium at 06/26/15 0819  . docusate sodium (COLACE) capsule 100 mg  100 mg Oral Daily Jenne Campus, MD   100 mg at 06/26/15 0819  . doxycycline (VIBRA-TABS) tablet 100 mg  100 mg Oral Q12H Theodis Blaze, MD   100 mg at 06/26/15 0819  . DULoxetine (CYMBALTA) DR capsule 60 mg  60 mg Oral Daily Jenne Campus, MD   60 mg at 06/26/15 0819  . feeding supplement (ENSURE ENLIVE) (ENSURE ENLIVE) liquid 237 mL  237 mL Oral Q24H Maricela Bo Ostheim, RD   237 mL at 06/21/15 1048  . gabapentin (NEURONTIN) capsule 600 mg  600 mg Oral QID Jenne Campus, MD   600 mg at 06/26/15 1125  . HYDROcodone-acetaminophen (NORCO/VICODIN) 5-325 MG per tablet 1 tablet  1 tablet Oral Q4H PRN Jenne Campus, MD   1 tablet at 06/26/15 1005  . lidocaine (LIDODERM) 5 % 1 patch  1 patch Transdermal Daily Delfin Gant, NP   1 patch at 06/26/15 [phone]  . lisinopril (PRINIVIL,ZESTRIL) tablet 5 mg  5 mg Oral Daily Jenne Campus, MD   5 mg at 06/26/15 0818  . loratadine (CLARITIN) tablet 10 mg  10 mg Oral Daily Encarnacion Slates, NP   10 mg at 06/26/15 0819  . meclizine (ANTIVERT) tablet 25 mg  25 mg Oral Daily PRN Delfin Gant, NP   25 mg at 06/25/15 2159  . ondansetron (ZOFRAN-ODT) disintegrating tablet 4 mg  4 mg Oral Q8H PRN Jenne Campus, MD   4 mg at 06/25/15 2159  . potassium chloride (K-DUR,KLOR-CON) CR tablet 10 mEq  10 mEq Oral Daily Theodis Blaze, MD   10 mEq at 06/26/15 0819  . pyridOXINE (VITAMIN B-6) tablet 50 mg  50 mg Oral Daily Delfin Gant, NP   50 mg at 06/26/15 0818  . sodium chloride (OCEAN) 0.65 % nasal spray 1 spray  1 spray Each Nare PRN Lurena Nida, NP      . SUMAtriptan (IMITREX) tablet 50 mg  50 mg Oral  Q2H PRN Delfin Gant, NP   50 mg at 06/26/15 0332  . temazepam (RESTORIL) capsule 7.5 mg  7.5 mg Oral QHS PRN Harriet Butte, NP   7.5 mg at 06/25/15 2158    Lab Results:  Results for orders placed or performed during the hospital encounter of 06/16/15 (from the past 48 hour(s))  Basic metabolic panel     Status: Abnormal   Collection Time: 06/25/15  6:56 AM  Result  Value Ref Range   Sodium 137 135 - 145 mmol/L   Potassium 4.1 3.5 - 5.1 mmol/L   Chloride 101 101 - 111 mmol/L   CO2 24 22 - 32 mmol/L   Glucose, Bld 124 (H) 65 - 99 mg/dL   BUN 25 (H) 6 - 20 mg/dL   Creatinine, Ser 0.91 0.44 - 1.00 mg/dL   Calcium 9.3 8.9 - 10.3 mg/dL   GFR calc non Af Amer >60 >60 mL/min   GFR calc Af Amer >60 >60 mL/min    Comment: (NOTE) The eGFR has been calculated using the CKD EPI equation. This calculation has not been validated in all clinical situations. eGFR's persistently <60 mL/min signify possible Chronic Kidney Disease.    Anion gap 12 5 - 15    Comment: Performed at [facility]  CBC with Differential/Platelet     Status: None   Collection Time: 06/25/15  6:56 AM  Result Value Ref Range   WBC 8.6 4.0 - 10.5 K/uL   RBC 4.24 3.87 - 5.11 MIL/uL   Hemoglobin 13.0 12.0 - 15.0 g/dL   HCT 40.0 36.0 - 46.0 %   MCV 94.3 78.0 - 100.0 fL   MCH 30.7 26.0 - 34.0 pg   MCHC 32.5 30.0 - 36.0 g/dL   RDW 13.8 11.5 - 15.5 %   Platelets 326 150 - 400 K/uL   Neutrophils Relative % 48 %   Neutro Abs 4.1 1.7 - 7.7 K/uL   Lymphocytes Relative 42 %   Lymphs Abs 3.6 0.7 - 4.0 K/uL   Monocytes Relative 7 %   Monocytes Absolute 0.6 0.1 - 1.0 K/uL   Eosinophils Relative 3 %   Eosinophils Absolute 0.2 0.0 - 0.7 K/uL   Basophils Relative 1 %   Basophils Absolute 0.1 0.0 - 0.1 K/uL   Smear Review MORPHOLOGY UNREMARKABLE     Comment: Performed at [facility]    Physical Findings: AIMS: Facial and Oral Movements Muscles of Facial Expression: None, normal Lips and Perioral Area: None, normal Jaw: None, normal Tongue: None, normal,Extremity Movements Upper (arms, wrists, hands, fingers): None, normal Lower (legs, knees, ankles, toes): None, normal, Trunk Movements Neck, shoulders, hips: None, normal, Overall Severity Severity of abnormal movements (highest score from questions above): None, normal Incapacitation due to abnormal  movements: None, normal Patient's awareness of abnormal movements (rate only patient's report): No Awareness, Dental Status Current problems with teeth and/or dentures?: No Does patient usually wear dentures?: No  CIWA:    COWS:     Musculoskeletal: Strength & Muscle Tone: within normal limits Gait & Station: unsteady patient use walker/wheelchaire for ambulation assistances Patient leans: N/A  Psychiatric Specialty Exam: Review of Systems  Constitutional: Negative.   HENT: Negative.   Eyes: Negative.   Musculoskeletal: Positive for back pain.       Leg pain 10/10  Skin: Negative.   Neurological: Positive for tingling.  Psychiatric/Behavioral: Positive for depression. The patient is nervous/anxious and has insomnia.   All other systems  reviewed and are negative. describes chronic back pain and foot pain. Denies chest pain, denies SOB   Blood pressure 125/77, pulse 86, temperature 98.4 F (36.9 C), temperature source Oral, resp. rate 16, height 5' 3"  (1.6 m), weight 196 lb (88.905 kg), SpO2 99 %.Body mass index is 34.73 kg/(m^2).  General Appearance: Fairly Groomed- but has improved   Engineer, water::    Improved today   Speech:  Clear and Coherent  Volume:  Normal   Mood:   Depressed- less irritable, and affect more reactive   Affect:     Although constricted, more reactive today  Thought Process:   Linear   Orientation:  Full (Time, Place, and Person)  Thought Content:  denies hallucinations and does not appear internally preoccupied , no delusions expressed   Suicidal Thoughts:  Today denies any active SI, contracts for safety on unit.  Homicidal Thoughts:  No  Memory:  Recent and remote grossly intact   Judgement:  fair  Insight:  Fair  Psychomotor Activity:   Some improvement , out of bed more often  Concentration:  Good  Recall:  Good  Fund of Knowledge:Good  Language: Good  Akathisia:  No  Handed:  Right  AIMS (if indicated):     Assets:  Communication  Skills Desire for Improvement Social Support  ADL's:  Intact  Cognition: WNL  Sleep:  Number of Hours: 3.75    Assessment -   Patient presenting with gradual improvement overall- less irritable ,  Better eye contact ,more amenable to staff encouragement , getting out of bed more often and less isolative . She does continue to focus on her pain, which she reports is severe . Of note, at this time agreeing to taper off BZDs , which we have discussed - agrees to trazodone PRNs for insomnia if needed .    Treatment Plan Summary:  Daily contact with patient to assess and evaluate symptoms and progress in treatment and Medication management  Encourage increased milieu, group participation, in order to work on coping skills and symptom reduction. Continue to encourage , provide support, reassurance Continue CYMBALTA 60   mgrs QDAY for depression/ pain  Continue  NEURONTIN to 600 mgrs QID  for anxiety and pain Continue LIDODERM patch for back pain Increase  VICODIN 5/325  mgrs  Q 4 hours  PRN for pain  D/C  TEMAZEPAM  Start TRAZODONE 50 mgrs QHS For insomnia COLACE/DULCOLAX as needed for constipation. D/C DOXICYCLINE - has completed x 1 week ( WBC WNL, no  Genitourinary symptoms or fever at this time)   Neita Garnet , MD   06/26/2015, 2:37 PM

## 2015-06-26 NOTE — Progress Notes (Signed)
D:Pt presents flat in affect and sad in mood. Pt reports that she was able to get some sleep with the increased dose of her pain medication. Pt is hoping to get a better night of rest with her medication change. Pt is currently denying any SI/HI. No AVH voiced by pt.  A: Writer administered scheduled and prn medications to pt, per MD orders. Continued support and availability as needed was extended to this pt. Staff continues to monitor pt with q8min checks.  R: No adverse drug reactions noted. Pt receptive to treatment. Pt remains safe at this time.

## 2015-06-26 NOTE — Progress Notes (Signed)
  Pt currently presents with a flat affect and irritable behavior. Pt reports pain in leg and back. Pt reports to MD that she will get out of bed today, pt remained in bed except to go to the bathroom. Pt reports inability to sleep. Pt conversation turns towards negative thoughts about herself and her situation. Pt voices "I always tried to be a good mother but I'm not, I'm a failure, I keep praying to God to get me out of this pain."   Pt provided with medications per providers orders. Pt's labs and vitals were monitored throughout the day. Pt supported emotionally and encouraged to express concerns and questions. Pt educated on medications and depression.  Pt's safety ensured with 15 minute and environmental checks. Pt currently denies SI/HI and A/V hallucinations. Pt verbally agrees to seek staff if SI/HI or A/VH occurs and to consult with staff before acting on these thoughts. Pt verbalizes that she will think about going to a skilled nursing facility for further assistance post discharge. Will continue POC.

## 2015-06-26 NOTE — BHH Group Notes (Signed)
[place] LCSW Group Therapy  06/26/2015 10:00 AM  Type of Therapy:  Group Therapy  Participation Level:  Did Not Attend; Invited to participate but declined   Sheilah Pigeon, LCSW

## 2015-06-26 NOTE — Progress Notes (Signed)
Patient ID: [name], female   DOB: December 21, 1940, 75 y.o.   MRN: [id number]  Pt reports to writer "I have NO pain. I cannot believe it!" Pt reports that she feels relief from the medicine and that she was able to sleep for a couple of hours. Writer assisted pt to walk a lap on the unit. 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 Pt currently in bed, reading a book awaiting meal.

## 2015-06-27 MED ORDER — IBUPROFEN 600 MG PO TABS
600.0000 mg | ORAL_TABLET | Freq: Four times a day (QID) | ORAL | Status: DC | PRN
  Administered 2015-06-27 – 2015-06-28 (×4): 600 mg via ORAL
  Filled 2015-06-27 (×4): qty 1

## 2015-06-27 NOTE — Progress Notes (Signed)
PT Cancellation Note  Patient Details Name: [name] MRN: [id number] DOB: Sep 13, 1940   Cancelled Treatment:    Reason Eval/Treat Not Completed: Other (comment)  Fixed RW so it could be lowered a little for her height and what she would like. Once I entered the pt stated she was in too much pain at the moment to walk or participate in PT. I did read and pt stated she did walk with nursing and had been getting up with RW. Pt agreed that she will possibly DC soon adn can walk in room adn with nursing in hallways. So at this time will sign off . Do still recommend HHPT to follow to home safety and continued PT in home if needed.   Please set up HHPT for this pt if you agree.  Thanks    Clide Dales 06/27/2015, 4:50 PM  Clide Dales, PT Pager: [phone] 06/27/2015

## 2015-06-27 NOTE — BHH Group Notes (Signed)
[facility] LCSW Aftercare Discharge Planning Group Note  06/27/2015 8:45 AM  Pt did not attend, declined invitation.   Peri Maris, LCSWA 06/27/2015 9:25 AM

## 2015-06-27 NOTE — Progress Notes (Signed)
D: Pt presents flat, depressed, tearful, and hopeless. Pt reports having no motivation to participate in group therapy due to her pain. Pt is noted to be preoccupied with receiving pain medications. Pt appears to be asleep during checks but is awake within minutes of the next prn pain pill to be available. Pt is currently denying any SI/HI/AVH. A: Writer administered scheduled and prn medications to pt, per MD orders. Continued support and availability as needed was extended to this pt. Staff continues to monitor pt with q29min checks.  R: No adverse drug reactions noted. Pt receptive to treatment. Pt remains safe at this time.

## 2015-06-27 NOTE — Tx Team (Signed)
Interdisciplinary Treatment Plan Update (Adult) Date: 06/27/2015   Date: 06/27/2015 9:56 AM  Progress in Treatment:  Attending groups: No Participating in groups: No  Taking medication as prescribed: Yes  Tolerating medication: Yes  Family/Significant othe contact made:Yes with son Patient understands diagnosis: Yes AEB seeking help with depression Discussing patient identified problems/goals with staff: Yes  Medical problems stabilized or resolved: Yes  Denies suicidal/homicidal ideation: Yes Patient has not harmed self or Others: Yes   New problem(s) identified: None identified at this time.   Discharge Plan or Barriers: Pt will return home and follow-up with outpatient resources  Additional comments:  Patient and CSW reviewed pt's identified goals and treatment plan. Patient verbalized understanding and agreed to treatment plan. CSW reviewed [facility] "Discharge Process and Patient Involvement" Form. Pt verbalized understanding of information provided and signed form.   Reason for Continuation of Hospitalization:  Depression Medication stabilization  Estimated length of stay: 2-3 days  Review of initial/current patient goals per problem list:   1.  Goal(s): Patient will participate in aftercare plan  Met:  Yes  Target date: 3-5 days from date of admission   As evidenced by: Patient will participate within aftercare plan AEB aftercare provider and housing plan at discharge being identified.   06/17/15: Pt will return home and follow-up with outpatient resources  2.  Goal (s): Patient will exhibit decreased depressive symptoms and suicidal ideations.  Met:  Progressing  Target date: 3-5 days from date of admission   As evidenced by: Patient will utilize self rating of depression at 3 or below and demonstrate decreased signs of depression or be deemed stable for discharge by MD. 06/17/15: Pt was admitted with symptoms of depression, rating 10/10. Pt continues to present with  flat affect and depressive symptoms.  Pt will demonstrate decreased symptoms of depression and rate depression at 3/10 or lower prior to discharge. 06/21/15: Pt continues to endorse passive thoughts of dying and is irritable with staff; Pt refuses to participate in programming and is observed to be tearful. 06/27/15: Pt continues to labile in affect and isolated on the unit.   Attendees:  Patient:    Family:    Physician: Dr. Sabra Heck, MD; Dr. Shea Evans, MD  06/27/2015 9:56 AM  Nursing: Lars Pinks, RN Case manager  06/27/2015 9:56 AM  Clinical Social Worker Peri Maris, [place] 06/27/2015 9:56 AM  Other: Tilden Fossa, [place] 06/27/2015 9:56 AM  Clinical:  Marcella Dubs, RN 06/27/2015 9:56 AM  Other: , RN Charge Nurse 06/27/2015 9:56 AM  Other:    Peri Maris, [place] Social Work [phone]

## 2015-06-27 NOTE — Progress Notes (Signed)
Patient ID: [name], female   DOB: 11-27-1940, 75 y.o.   MRN: [id number]  Pt currently presents with a flat affect and depressed behavior. Pt mood labile, pt smiling and tearful during one interaction. Per self inventory, pt rates depression at a 2, hopelessness 10 and anxiety 10. Pt's daily goal is to "pain" and they intend to do so by "work with nurse for prn." Pt reports poor sleep, a good appetite, low energy and poor concentration. Pt expresses goal to writer this morning to get up and take a tub bath.   Pt provided with medications per providers orders. Pt's labs and vitals were monitored throughout the day. Pt supported emotionally and encouraged to express concerns and questions. Pt educated on medications.  Pt's safety ensured with 15 minute and environmental checks. Pt currently denies SI/HI and A/V hallucinations. Pt verbally agrees to seek staff if SI/HI or A/VH occurs and to consult with staff before acting on these thoughts. Pt remains in bed today. Pt reports that she would like to "after going home; see a pain clinic and go to my neurologist and neurosurgeon appointments this week." Will continue POC.

## 2015-06-27 NOTE — Progress Notes (Signed)
Adult Psychoeducational Group Note  Date:  06/27/2015 Time:  10:07 PM  Group Topic/Focus:  Wrap-Up Group:   The focus of this group is to help patients review their daily goal of treatment and discuss progress on daily workbooks.  Participation Level:  Did Not Attend  Participation Quality:  Did not attend  Affect:  Did not attend  Cognitive:  Did not attend  Insight: None  Engagement in Group:  Did not attend  Modes of Intervention:  Did not attend  Additional Comments:  Patient did not attend wrap up group tonight.  Donisha Hoch L Cinch [place] 06/27/2015, 10:07 PM

## 2015-06-27 NOTE — BHH Group Notes (Signed)
[place] LCSW Group Therapy  06/27/2015 1:15pm  Type of Therapy:  Group Therapy vercoming Obstacles  Pt did not attend, declined invitation.   Peri Maris, [place] 06/27/2015 2:14 PM

## 2015-06-27 NOTE — Progress Notes (Signed)
Patient ID: [name], female   DOB: [birth year], 75 y.o.   MRN: [id number] Patient ID: [name], female   DOB: 06-27-1940, 75 y.o.   MRN: [id number] [facility] MD Progress Note  06/27/2015 3:07 PM [name]  MRN:  [id number]   Subjective:  [name] reports, "I'm in a lot of pain. I'm not doing well. I feel in the bathroom several days ago. My depression is much better. Pain not good. Sleep not good"  Objective: I have met with patient, chart reviewed. Patient remains labile, and as discussed with RN, presentation is variable, at times smiling and cooperative and at others more irritable, angry. Gradually mood has tended to improve. She says today that her depression is much better Today we discussed disposition options - patient states she would consider an ALF as a temporary measure, but would prefer to return home, and states she does not want to give up on her independence if possible . She minimizes sons' concerns about ability to function independently, and states she feels she can .  No disruptive behaviors on the unit, going to some groups, gradually able to get out of bed and be visible in some group milieu with, needs staff encouragement . As noted, continues to focus on severe pain.  She states she has no  History of abusing opiates, states she has taken them as prescribed in the past . We discussed strategies for pain management, continue on current Vicodin dose to address ongoing severe pain.  Principal Problem: MDD (major depressive disorder), recurrent episode, severe ([place]) Diagnosis:   Patient Active Problem List   Diagnosis Date Noted  . Severe episode of recurrent major depressive disorder, without psychotic features ([place]) [F33.2]   . Back pain [M54.9] 06/20/2015  . MDD (major depressive disorder), recurrent episode, severe ([place]) [F33.2] 06/16/2015  . Lumbar stenosis [M48.06] 04/19/2015  . Hypokalemia [E87.6] 09/19/2014  . Constipation [K59.00] 09/19/2014  . Acute  encephalopathy [G93.40] 09/18/2014  . Acute respiratory failure ([place]) [J96.00] 09/17/2014  . OSA (obstructive sleep apnea) [G47.33] 09/17/2014  . Ankle fracture, lateral malleolus, closed [S82.63XA] 09/14/2014  . Major depressive disorder, recurrent severe without psychotic features ([place]) [F33.2] 08/07/2014  . Insomnia [G47.00] 08/05/2014  . Hip joint painful on movement [M25.559] 08/05/2014  . Degenerative disc disease, lumbar [M51.36] 08/05/2014  . Severe obesity (BMI >= 40) ([place]) [E66.01] 01/11/2014  . Hyperlipemia [E78.5]   . Depression [F32.9]   . Arthritis [M19.90]   . Polyneuropathy in other diseases classified elsewhere ([place]) [G63] 04/08/2013  . Restless legs syndrome (RLS) [G25.81] 04/08/2013  . UTI (lower urinary tract infection) [N39.0] 10/14/2011  . Dizziness [R42] 10/12/2011  . Fall [W19.XXXA] 10/12/2011  . TIA (transient ischemic attack) [G45.9] 10/12/2011  . Cellulitis [L03.90] 10/12/2011  . HTN (hypertension) [I10] 10/12/2011  . GERD (gastroesophageal reflux disease) [K21.9] 10/12/2011   Total Time spent with patient: 15  Minutes   Past Psychiatric History: SEE ABOVE   Past Medical History:  Past Medical History  Diagnosis Date  . GERD (gastroesophageal reflux disease)     hx pud '98  . Arthritis     djd  . Restless leg syndrome     takes Sinemet daily  . 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 Dysrhythmia     hx tachy palpitations  . Neuromuscular disorder ([place])     peripheral neuropathy, FEET AND LEGS  . Stroke [facility])     ? 6 months ago -  tests okay - [facility]   . Cellulitis     10/11/11 hospitalized for cellulitis   . Hyperlipemia     takes Atorvastatin daily  . Peripheral neuropathy ([place])   . Elbow fracture, left April 2015  . Depression     takes Zoloft daily  . Vertigo     takes Meclizine daily as needed  . Insomnia     takes restoril nightly as needed  . Hypertension     takes HCTZ daily  . Peripheral neuropathy ([facility])     takes  Gabapentin daily  . History of bronchitis     many yrs ago  . Headache(784.0)     migraines yrs ago-takes Imitrex daily  . Joint pain   . Joint swelling   . Chronic back pain     scoliosis/stenosis  . Anxiety     takes Ativan daily as needed  . Cataracts, bilateral     immature   . Depression     Past Surgical History  Procedure Laterality Date  . Cervical disc surgery x2  2011  . Rt carotid enarterectomy  2011  . Rt knee arthroscopy  '99  . Left knee arthroscopy  2001  . Hematoma evacuation  2011    s/p rt cea  . Joint replacement  '01    total knee replacement, LEFT  . Abdominal hysterectomy  1975    bil oophorectomy  . Back surgery   MANY YRS AGO    laminectomy x3  . Cholecystectomy  1993  . Total knee arthroplasty  09/13/2011    Procedure: TOTAL KNEE ARTHROPLASTY;  Surgeon: Magnus Sinning, MD;  Location: [facility];  Service: Orthopedics;  Laterality: Right;  . Right knee replacement      09/2011   . Carpal tunnel release  07/09/2012    Procedure: CARPAL TUNNEL RELEASE;  Surgeon: Magnus Sinning, MD;  Location: [facility];  Service: Orthopedics;  Laterality: Left;  . Finger arthroplasty  07/09/2012    Procedure: FINGER ARTHROPLASTY;  Surgeon: Magnus Sinning, MD;  Location: [facility];  Service: Orthopedics;  Laterality: Left;  Interposition Arthroplasty [facility] Joint Thumb Left   . Lipoma excision  07/09/2012    Procedure: EXCISION LIPOMA;  Surgeon: Magnus Sinning, MD;  Location: [facility];  Service: Orthopedics;  Laterality: Left;  Excision Lipoma Dorsum Left Wrist   . Orif ankle fracture Right 09/14/2014    FIBULA   . Orif ankle fracture Right 09/14/2014    Procedure: OPEN REDUCTION INTERNAL FIXATION (ORIF) RIGHT ANKLE FRACTURE/SYNDESMOSIS ;  Surgeon: Wylene Simmer, MD;  Location: [facility];  Service: Orthopedics;  Laterality: Right;  . Colonosocpy    . Esophagogastroduodenoscopy    . Lumbar laminectomy with coflex 1 level Bilateral 04/19/2015    Procedure: LUMBAR TWO-THREE LUMBAR  LAMINECTOMY WITH COFLEX ;  Surgeon: Kristeen Miss, MD;  Location: [facility];  Service: Neurosurgery;  Laterality: Bilateral;  Bilateral L23 laminectomy and foraminotomy with coflex   Family History:  Family History  Problem Relation Age of Onset  . Congestive Heart Failure Father   . Congestive Heart Failure Sister   . Alzheimer's disease Mother   . Diabetes Brother   . Arthritis Brother    Family Psychiatric  History: SEE ABOVE  Social History:  History  Alcohol Use No     History  Drug Use No    Social History   Social History  . Marital Status: Widowed    Spouse Name: N/A  . Number of  Children: 2  . Years of Education: 78   Social History Main Topics  . Smoking status: Never Smoker   . Smokeless tobacco: Never Used  . Alcohol Use: No  . Drug Use: No  . Sexual Activity: Yes    Birth Control/ Protection: Surgical   Other Topics Concern  . None   Social History Narrative   Patient is widowed and lives alone.   Patient has two sons.   Patient is retired.   Patient has a college education.   Patient is right-handed.   Patient drinks three glasses of Coke-soda daily.         Additional Social History:   Sleep:  Fair, partially improved   Appetite:   Fair - improved PO intake   Current Medications: Current Facility-Administered Medications  Medication Dose Route Frequency Provider Last Rate Last Dose  . aspirin tablet 325 mg  325 mg Oral Daily Delfin Gant, NP   325 mg at 06/27/15 0823  . atorvastatin (LIPITOR) tablet 40 mg  40 mg Oral Daily Delfin Gant, NP   40 mg at 06/27/15 0823  . calcium carbonate (OS-CAL - dosed in mg of elemental calcium) tablet 500 mg of elemental calcium  1 tablet Oral Q breakfast Delfin Gant, NP   500 mg of elemental calcium at 06/27/15 0824  . docusate sodium (COLACE) capsule 100 mg  100 mg Oral Daily Jenne Campus, MD   100 mg at 06/27/15 0823  . doxycycline (VIBRA-TABS) tablet 100 mg  100 mg Oral Q12H  Jenne Campus, MD   100 mg at 06/27/15 0823  . DULoxetine (CYMBALTA) DR capsule 60 mg  60 mg Oral Daily Jenne Campus, MD   60 mg at 06/27/15 0823  . feeding supplement (ENSURE ENLIVE) (ENSURE ENLIVE) liquid 237 mL  237 mL Oral Q24H Maricela Bo Ostheim, RD   237 mL at 06/21/15 1048  . gabapentin (NEURONTIN) capsule 600 mg  600 mg Oral QID Jenne Campus, MD   600 mg at 06/27/15 1217  . HYDROcodone-acetaminophen (NORCO) 10-325 MG per tablet 1 tablet  1 tablet Oral Q8H PRN Jenne Campus, MD   1 tablet at 06/27/15 0825  . ibuprofen (ADVIL,MOTRIN) tablet 600 mg  600 mg Oral Q6H PRN Encarnacion Slates, NP   600 mg at 06/27/15 1422  . lidocaine (LIDODERM) 5 % 1 patch  1 patch Transdermal Daily Delfin Gant, NP   1 patch at 06/26/15 [phone]  . lisinopril (PRINIVIL,ZESTRIL) tablet 5 mg  5 mg Oral Daily Jenne Campus, MD   5 mg at 06/27/15 0823  . loratadine (CLARITIN) tablet 10 mg  10 mg Oral Daily Encarnacion Slates, NP   10 mg at 06/27/15 0037  . meclizine (ANTIVERT) tablet 25 mg  25 mg Oral Daily PRN Delfin Gant, NP   25 mg at 06/25/15 2159  . ondansetron (ZOFRAN-ODT) disintegrating tablet 4 mg  4 mg Oral Q8H PRN Jenne Campus, MD   4 mg at 06/25/15 2159  . potassium chloride (K-DUR,KLOR-CON) CR tablet 10 mEq  10 mEq Oral Daily Theodis Blaze, MD   10 mEq at 06/27/15 0823  . pyridOXINE (VITAMIN B-6) tablet 50 mg  50 mg Oral Daily Delfin Gant, NP   50 mg at 06/27/15 0488  . sodium chloride (OCEAN) 0.65 % nasal spray 1 spray  1 spray Each Nare PRN Lurena Nida, NP   1 spray at 06/27/15 [phone]  .  SUMAtriptan (IMITREX) tablet 50 mg  50 mg Oral Q2H PRN Delfin Gant, NP   50 mg at 06/27/15 0346  . traZODone (DESYREL) tablet 50 mg  50 mg Oral QHS PRN Jenne Campus, MD   50 mg at 06/26/15 2325   Lab Results:  No results found for this or any previous visit (from the past 73 hour(s)).  Physical Findings: AIMS: Facial and Oral Movements Muscles of Facial Expression: None,  normal Lips and Perioral Area: None, normal Jaw: None, normal Tongue: None, normal,Extremity Movements Upper (arms, wrists, hands, fingers): None, normal Lower (legs, knees, ankles, toes): None, normal, Trunk Movements Neck, shoulders, hips: None, normal, Overall Severity Severity of abnormal movements (highest score from questions above): None, normal Incapacitation due to abnormal movements: None, normal Patient's awareness of abnormal movements (rate only patient's report): No Awareness, Dental Status Current problems with teeth and/or dentures?: No Does patient usually wear dentures?: No  CIWA:    COWS:     Musculoskeletal: Strength & Muscle Tone: within normal limits Gait & Station: unsteady patient use walker/wheelchaire for ambulation assistances Patient leans: N/A  Psychiatric Specialty Exam: Review of Systems  Constitutional: Negative.   HENT: Negative.   Eyes: Negative.   Musculoskeletal: Positive for back pain.       Leg pain 10/10  Skin: Negative.   Neurological: Positive for tingling.  Psychiatric/Behavioral: Positive for depression. The patient is nervous/anxious and has insomnia.   All other systems reviewed and are negative. describes chronic back pain and foot pain. Denies chest pain, denies SOB   Blood pressure 129/66, pulse 92, temperature 98.3 F (36.8 C), temperature source Oral, resp. rate 16, height 5' 3" (1.6 m), weight 88.905 kg (196 lb), SpO2 99 %.Body mass index is 34.73 kg/(m^2).  General Appearance: Fairly Groomed- but has improved   Engineer, water::    Improved today   Speech:  Clear and Coherent  Volume:  Normal   Mood:   Depressed- less irritable, and affect more reactive   Affect: Flat  Thought Process:   Linear   Orientation:  Full (Time, Place, and Person)  Thought Content:  denies hallucinations and does not appear internally preoccupied , no delusions expressed   Suicidal Thoughts:  Today denies any active SI, contracts for safety on unit.   Homicidal Thoughts:  No  Memory:  Recent and remote grossly intact   Judgement:  fair  Insight:  Fair  Psychomotor Activity:  Decreased, stayed in bed all day today.  Concentration:  Good  Recall:  Good  Fund of Knowledge:Good  Language: Good  Akathisia:  No  Handed:  Right  AIMS (if indicated):     Assets:  Communication Skills Desire for Improvement Social Support  ADL's:  Intact  Cognition: WNL  Sleep:  Number of Hours: 4.5   Assessment - Patient presenting with gradual improvement overall- less irritable, better eye contact ,more amenable to staff encouragement, refused to get out of bed today, blamed it on her pain. Is isolative today because did not want people to keep watching feeling uncomfortable. Her focus continue to be on her pain, which she reports is severe . Added Ibuprofen 600 mg prn for break through pain. Remains on trazodone PRNs for insomnia if needed .  Treatment Plan Summary: Daily contact with patient to assess and evaluate symptoms and progress in treatment and Medication management  Encourage increased milieu, group participation, in order to work on coping skills and symptom reduction. Continue to encourage , provide support,  reassurance Continue CYMBALTA 60 mgrs QDAY for depression/ pain  Continue  NEURONTIN 600 mgrs QID  for anxiety and pain Continue LIDODERM patch for back pain Increase  VICODIN 5/325  mgrs  Q 4 hours  PRN for pain  Continue TRAZODONE 50 mgrs QHS For insomnia Continue COLACE/DULCOLAX as needed for constipation.  Encarnacion Slates, PMHNP, FNP-BC 06/27/2015, 3:07 PM  I agree with assessment and plan Geralyn Flash A. Sabra Heck, M.D.

## 2015-06-28 MED ORDER — LISINOPRIL 5 MG PO TABS: 5.0000 mg | ORAL_TABLET | Freq: Every day | ORAL | Status: DC

## 2015-06-28 MED ORDER — POTASSIUM CHLORIDE CRYS ER 10 MEQ PO TBCR: 10.0000 meq | EXTENDED_RELEASE_TABLET | Freq: Every day | ORAL | Status: DC

## 2015-06-28 MED ORDER — SALINE SPRAY 0.65 % NA SOLN: 1.0000 | NASAL | Status: AC | PRN

## 2015-06-28 MED ORDER — LIDOCAINE 5 % EX PTCH: 1.0000 | MEDICATED_PATCH | Freq: Every day | CUTANEOUS | Status: DC

## 2015-06-28 MED ORDER — HYDROCODONE-ACETAMINOPHEN 5-325 MG PO TABS: ORAL_TABLET | ORAL | Status: DC

## 2015-06-28 MED ORDER — SUMATRIPTAN SUCCINATE 50 MG PO TABS: ORAL_TABLET | ORAL | Status: DC

## 2015-06-28 MED ORDER — TRAZODONE HCL 50 MG PO TABS: 50.0000 mg | ORAL_TABLET | Freq: Every evening | ORAL | Status: DC | PRN

## 2015-06-28 MED ORDER — VITAMIN B-6 50 MG PO TABS: 50.0000 mg | ORAL_TABLET | Freq: Every day | ORAL | Status: DC

## 2015-06-28 MED ORDER — DULOXETINE HCL 60 MG PO CPEP: 60.0000 mg | ORAL_CAPSULE | Freq: Every day | ORAL | Status: DC

## 2015-06-28 MED ORDER — MECLIZINE HCL 25 MG PO TABS: ORAL_TABLET | ORAL | Status: DC

## 2015-06-28 MED ORDER — ASPIRIN 325 MG PO TABS: 325.0000 mg | ORAL_TABLET | Freq: Every day | ORAL | Status: DC

## 2015-06-28 MED ORDER — GABAPENTIN 300 MG PO CAPS: 600.0000 mg | ORAL_CAPSULE | Freq: Four times a day (QID) | ORAL | Status: DC

## 2015-06-28 MED ORDER — LORATADINE 10 MG PO TABS: 10.0000 mg | ORAL_TABLET | Freq: Every day | ORAL | Status: DC

## 2015-06-28 MED ORDER — VITAMIN D 1000 UNITS PO TABS: 1000.0000 [IU] | ORAL_TABLET | Freq: Every day | ORAL | Status: DC

## 2015-06-28 MED ORDER — ATORVASTATIN CALCIUM 40 MG PO TABS: ORAL_TABLET | ORAL | Status: DC

## 2015-06-28 MED ORDER — DOCUSATE SODIUM 100 MG PO CAPS: 100.0000 mg | ORAL_CAPSULE | Freq: Every day | ORAL | Status: DC

## 2015-06-28 NOTE — Progress Notes (Signed)
Patient ID: MATALYN HOOK, female   DOB: [birth year], 75 y.o.   MRN: [id number] Adult Psychoeducational Group Note  Date:  06/28/2015 Time: 09:00  Group Topic/Focus:  Recovery Goals:   The focus of this group is to identify appropriate goals for recovery and establish a plan to achieve them.  Participation Level:  Did Not Attend  Participation Quality: n/a  Affect: n/a  Cognitive: n/a  Insight: n/a  Engagement in Group: n/a  Modes of Intervention:  Discussion, Education, Orientation and Support  Additional Comments:  Pt did not attend group, pt in bed asleep.   Elenore Rota 06/28/2015, 9:46 AM

## 2015-06-28 NOTE — Tx Team (Signed)
Interdisciplinary Treatment Plan Update (Adult) Date: 06/28/2015   Date: 06/28/2015 10:38 AM  Progress in Treatment:  Attending groups: No Participating in groups: No  Taking medication as prescribed: Yes  Tolerating medication: Yes  Family/Significant othe contact made:Yes with son Patient understands diagnosis: Yes AEB seeking help with depression Discussing patient identified problems/goals with staff: Yes  Medical problems stabilized or resolved: Yes  Denies suicidal/homicidal ideation: Yes Patient has not harmed self or Others: Yes   New problem(s) identified: None identified at this time.   Discharge Plan or Barriers: Pt will return home and follow-up with outpatient resources  Additional comments:  Patient and CSW reviewed pt's identified goals and treatment plan. Patient verbalized understanding and agreed to treatment plan. CSW reviewed [facility] "Discharge Process and Patient Involvement" Form. Pt verbalized understanding of information provided and signed form.   Reason for Continuation of Hospitalization:  Depression Medication stabilization  Estimated length of stay: 0 days  Review of initial/current patient goals per problem list:   1.  Goal(s): Patient will participate in aftercare plan  Met:  Yes  Target date: 3-5 days from date of admission   As evidenced by: Patient will participate within aftercare plan AEB aftercare provider and housing plan at discharge being identified.   06/17/15: Pt will return home and follow-up with outpatient resources  2.  Goal (s): Patient will exhibit decreased depressive symptoms and suicidal ideations.  Met:  Adequate for DC  Target date: 3-5 days from date of admission   As evidenced by: Patient will utilize self rating of depression at 3 or below and demonstrate decreased signs of depression or be deemed stable for discharge by MD. 06/17/15: Pt was admitted with symptoms of depression, rating 10/10. Pt continues to present with  flat affect and depressive symptoms.  Pt will demonstrate decreased symptoms of depression and rate depression at 3/10 or lower prior to discharge. 06/21/15: Pt continues to endorse passive thoughts of dying and is irritable with staff; Pt refuses to participate in programming and is observed to be tearful. 06/27/15: Pt continues to labile in affect and isolated on the unit.  06/28/15: MD feels that Pt's symptoms have decreased to the point that they can be managed in an outpatient setting. Pt denies SI  Attendees:  Patient:    Family:    Physician: Dr. Parke Poisson, MD  06/28/2015 10:38 AM  Nursing:   06/28/2015 10:38 AM  Clinical Social Worker Peri Maris, [place] 06/28/2015 10:38 AM  Other: Tilden Fossa, [place] 06/28/2015 10:38 AM  Clinical:  Marcella Dubs, RN 06/28/2015 10:38 AM  Other: , RN Charge Nurse 06/28/2015 10:38 AM  Other:    Peri Maris, [place] Social Work [phone]

## 2015-06-28 NOTE — Progress Notes (Signed)
Patient ID: [name], female   DOB: [birth year], 75 y.o.   MRN: [id number]  Pt currently presents with a flat affect and assertive behavior. Per self inventory, pt rates depression at a 4, hopelessness 5 and anxiety 8. All ratings are lower today since admission. Pt's daily goal is "going home" and they intend to do so by "talk to MD and SW." Pt reports poor sleep, a good appetite, normal energy and "fair" concentration. Pt reports difficulty sleeping due to roomates snoring.   Pt provided with medications per providers orders. Pt's labs and vitals were monitored throughout the day. Pt supported emotionally and encouraged to express concerns and questions. Pt educated on medications and mental health resources in her area.   Pt's safety ensured with 15 minute and environmental checks. Pt currently denies SI/HI and A/V hallucinations. Pt verbally agrees to seek staff if SI/HI or A/VH occurs and to consult with staff before acting on these thoughts. Pt to be discharged per MD orders. Pt looking forward to seeing her cat when she gets home. Will continue POC.

## 2015-06-28 NOTE — Progress Notes (Signed)
  Pt discharged home with her son. Pt was stable and appreciative at that time. All papers and prescriptions were given and valuables returned. Verbal understanding expressed. Denies SI/HI and A/VH. Pt given opportunity to express concerns and ask questions.

## 2015-06-28 NOTE — [facility] Suicide Risk Assessment (Signed)
[facility] Discharge Suicide Risk Assessment   Principal Problem: MDD (major depressive disorder), recurrent episode, severe ([place]) Discharge Diagnoses:  Patient Active Problem List   Diagnosis Date Noted  . Severe episode of recurrent major depressive disorder, without psychotic features ([place]) [F33.2]   . Back pain [M54.9] 06/20/2015  . MDD (major depressive disorder), recurrent episode, severe ([place]) [F33.2] 06/16/2015  . Lumbar stenosis [M48.06] 04/19/2015  . Hypokalemia [E87.6] 09/19/2014  . Constipation [K59.00] 09/19/2014  . Acute encephalopathy [G93.40] 09/18/2014  . Acute respiratory failure ([place]) [J96.00] 09/17/2014  . OSA (obstructive sleep apnea) [G47.33] 09/17/2014  . Ankle fracture, lateral malleolus, closed [S82.63XA] 09/14/2014  . Major depressive disorder, recurrent severe without psychotic features ([place]) [F33.2] 08/07/2014  . Insomnia [G47.00] 08/05/2014  . Hip joint painful on movement [M25.559] 08/05/2014  . Degenerative disc disease, lumbar [M51.36] 08/05/2014  . Severe obesity (BMI >= 40) ([place]) [E66.01] 01/11/2014  . Hyperlipemia [E78.5]   . Depression [F32.9]   . Arthritis [M19.90]   . Polyneuropathy in other diseases classified elsewhere ([place]) [G63] 04/08/2013  . Restless legs syndrome (RLS) [G25.81] 04/08/2013  . UTI (lower urinary tract infection) [N39.0] 10/14/2011  . Dizziness [R42] 10/12/2011  . Fall [W19.XXXA] 10/12/2011  . TIA (transient ischemic attack) [G45.9] 10/12/2011  . Cellulitis [L03.90] 10/12/2011  . HTN (hypertension) [I10] 10/12/2011  . GERD (gastroesophageal reflux disease) [K21.9] 10/12/2011    Total Time spent with patient: 30 minutes  Musculoskeletal: Strength & Muscle Tone: within normal limits Gait & Station: ambulates with walker  Patient leans: N/A  Psychiatric Specialty Exam: ROS  Blood pressure 144/83, pulse 98, temperature 98.3 F (36.8 C), temperature source Oral, resp. rate 16, height 5\' 3"  (1.6 m), weight 196 lb (88.905 kg),  SpO2 99 %.Body mass index is 34.73 kg/(m^2).  General Appearance: Fairly Groomed  Engineer, water::  improved  Speech:  Normal Rate409  Volume:  Normal  Mood:  less depressed, more reactive affect  Affect:  less constricted and less irritable than on admission, smiles at times appropriately  Thought Process:  Linear  Orientation:  Full (Time, Place, and Person)  Thought Content:  denies hallucinations, no delusions   Suicidal Thoughts:  No- denies any suicidal ideations, denies any self injurious ideations  Homicidal Thoughts:  No denies any violent or homicidal ideations   Memory:  recent and remote grossly intact   Judgement:  Improving   Insight:  improving   Psychomotor Activity:  more visible on the unit, getting out of bed more often  Concentration:  Good   Recall:  Good  Fund of Knowledge:Good  Language: Good  Akathisia:  Negative  Handed:  Right  AIMS (if indicated):     Assets:  Resilience  Sleep:  Number of Hours: 3.25  Cognition: WNL  ADL's:  Improving    Mental Status Per Nursing Assessment::   On Admission:     Demographic Factors:  75 year old widowed female, retired,  two adult sons, lives alone    Loss Factors: Chronic pain, strained relationship with sons at times   Historical Factors: History of depression, history of chronic pain   Risk Reduction Factors:   Positive coping skills or problem solving skills  Continued Clinical Symptoms:  Patient has improved partially compared to admission- mood has improved partially, affect is now more reactive, less irritable and less constricted, no thought disorder, denies any SI or HI, no psychotic symptoms. She has been less isolative, and has been getting up from bed at times and ambulating with  walker. At this time denies medication side effects  Cognitive Features That Contribute To Risk:  No gross cognitive deficits noted upon discharge. Is alert , attentive, and oriented x 3   Suicide Risk:  Mild:  Suicidal  ideation of limited frequency, intensity, duration, and specificity.  There are no identifiable plans, no associated intent, mild dysphoria and related symptoms, good self-control (both objective and subjective assessment), few other risk factors, and identifiable protective factors, including available and accessible social support.  Follow-up Information    Follow up with [facility].   Contact information:   [address] [place], [place] [address] [phone] Fax       Follow up with [facility] On 07/07/2015.   Why:  at 9:00am with Jose Persia for medication management. This practitioner would also be responsible for a pain management referral.   Contact information:   [place] [phone]      Follow up with [facility] On 06/29/2015.   Why:  for x-rays at 10:30am and for your follow-up with Dr. Ellene Route at 10:45am. Both appointments are completed on site.   Contact information:   [address] [place] [place], [place] [address] Phone: [phone] Fax: [phone]      Plan Of Care/Follow-up recommendations:  Activity:  as tolerated  Diet:  Regular Tests:  NA Other:  See below  Patient is requesting discharge, plans to return home. As reviewed with staff, there are no current grounds for involuntary commitment  Patient has upcopming appointment with her Neurosurgeon for management of back pain tomorrow, and has appt with her PCP on 2/2 /17. Encouraged to discuss referral to a pain clinic with her PCP. Follow up with [facility] as above .  Neita Garnet, MD 06/28/2015, 2:51 PM

## 2015-06-28 NOTE — Discharge Summary (Signed)
Physician Discharge Summary Note  Patient:  [name] is an 75 y.o., female MRN:  [id number] DOB:  March 23, 1941 Patient phone:  [phone] (home)  Patient address:   [place] [address],  Total Time spent with patient: Greater than 30 minutes  Date of Admission:  06/16/2015  Date of Discharge: 06-28-15  Reason for Admission:  Worsening symptoms of depression  Principal Problem: MDD (major depressive disorder), recurrent episode, severe [name])  Discharge Diagnoses: Patient Active Problem List   Diagnosis Date Noted  . Severe episode of recurrent major depressive disorder, without psychotic features ([place]) [F33.2]   . Back pain [M54.9] 06/20/2015  . MDD (major depressive disorder), recurrent episode, severe ([place]) [F33.2] 06/16/2015  . Lumbar stenosis [M48.06] 04/19/2015  . Hypokalemia [E87.6] 09/19/2014  . Constipation [K59.00] 09/19/2014  . Acute encephalopathy [G93.40] 09/18/2014  . Acute respiratory failure ([place]) [J96.00] 09/17/2014  . OSA (obstructive sleep apnea) [G47.33] 09/17/2014  . Ankle fracture, lateral malleolus, closed [S82.63XA] 09/14/2014  . Major depressive disorder, recurrent severe without psychotic features ([place]) [F33.2] 08/07/2014  . Insomnia [G47.00] 08/05/2014  . Hip joint painful on movement [M25.559] 08/05/2014  . Degenerative disc disease, lumbar [M51.36] 08/05/2014  . Severe obesity (BMI >= 40) ([place]) [E66.01] 01/11/2014  . Hyperlipemia [E78.5]   . Depression [F32.9]   . Arthritis [M19.90]   . Polyneuropathy in other diseases classified elsewhere ([place]) [G63] 04/08/2013  . Restless legs syndrome (RLS) [G25.81] 04/08/2013  . UTI (lower urinary tract infection) [N39.0] 10/14/2011  . Dizziness [R42] 10/12/2011  . Fall [W19.XXXA] 10/12/2011  . TIA (transient ischemic attack) [G45.9] 10/12/2011  . Cellulitis [L03.90] 10/12/2011  . HTN (hypertension) [I10] 10/12/2011  . GERD (gastroesophageal reflux disease) [K21.9] 10/12/2011    Past Psychiatric History: Major depression  Past Medical History:  Past Medical History  Diagnosis Date  . GERD (gastroesophageal reflux disease)     hx pud '98  . Arthritis     djd  . Restless leg syndrome     takes Sinemet daily  . 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 Dysrhythmia     hx tachy palpitations  . Neuromuscular disorder ([place])     peripheral neuropathy, FEET AND LEGS  . Stroke [facility])     ? 6 months ago - tests okay - [facility]   . Cellulitis     10/11/11 hospitalized for cellulitis   . Hyperlipemia     takes Atorvastatin daily  . Peripheral neuropathy ([place])   . Elbow fracture, left April 2015  . Depression     takes Zoloft daily  . Vertigo     takes Meclizine daily as needed  . Insomnia     takes restoril nightly as needed  . Hypertension     takes HCTZ daily  . Peripheral neuropathy (H[facility])     takes Gabapentin daily  . History of bronchitis     many yrs ago  . Headache(784.0)     migraines yrs ago-takes Imitrex daily  . Joint pain   . Joint swelling   . Chronic back pain     scoliosis/stenosis  . Anxiety     takes Ativan daily as needed  . Cataracts, bilateral     immature   . Depression     Past Surgical History  Procedure Laterality Date  . Cervical disc surgery x2  2011  . Rt carotid enarterectomy  2011  . Rt knee arthroscopy  '99  .  Left knee arthroscopy  2001  . Hematoma evacuation  2011    s/p rt cea  . Joint replacement  '01    total knee replacement, LEFT  . Abdominal hysterectomy  1975    bil oophorectomy  . Back surgery   MANY YRS AGO    laminectomy x3  . Cholecystectomy  1993  . Total knee arthroplasty  09/13/2011    Procedure: TOTAL KNEE ARTHROPLASTY;  Surgeon: Magnus Sinning, MD;  Location: [facility];  Service: Orthopedics;  Laterality: Right;  . Right knee replacement      09/2011   . Carpal tunnel release  07/09/2012    Procedure: CARPAL TUNNEL RELEASE;  Surgeon: Magnus Sinning, MD;  Location: [facility];  Service:  Orthopedics;  Laterality: Left;  . Finger arthroplasty  07/09/2012    Procedure: FINGER ARTHROPLASTY;  Surgeon: Magnus Sinning, MD;  Location: [facility];  Service: Orthopedics;  Laterality: Left;  Interposition Arthroplasty [facility] Joint Thumb Left   . Lipoma excision  07/09/2012    Procedure: EXCISION LIPOMA;  Surgeon: Magnus Sinning, MD;  Location: [facility];  Service: Orthopedics;  Laterality: Left;  Excision Lipoma Dorsum Left Wrist   . Orif ankle fracture Right 09/14/2014    FIBULA   . Orif ankle fracture Right 09/14/2014    Procedure: OPEN REDUCTION INTERNAL FIXATION (ORIF) RIGHT ANKLE FRACTURE/SYNDESMOSIS ;  Surgeon: Wylene Simmer, MD;  Location: [place];  Service: Orthopedics;  Laterality: Right;  . Colonosocpy    . Esophagogastroduodenoscopy    . Lumbar laminectomy with coflex 1 level Bilateral 04/19/2015    Procedure: LUMBAR TWO-THREE LUMBAR LAMINECTOMY WITH COFLEX ;  Surgeon: Kristeen Miss, MD;  Location: [facility] [facility];  Service: Neurosurgery;  Laterality: Bilateral;  Bilateral L23 laminectomy and foraminotomy with coflex   Family History:  Family History  Problem Relation Age of Onset  . Congestive Heart Failure Father   . Congestive Heart Failure Sister   . Alzheimer's disease Mother   . Diabetes Brother   . Arthritis Brother    Family Psychiatric  History: See H&P  Social History:  History  Alcohol Use No     History  Drug Use No    Social History   Social History  . Marital Status: Widowed    Spouse Name: N/A  . Number of Children: 2  . Years of Education: 62   Social History Main Topics  . Smoking status: Never Smoker   . Smokeless tobacco: Never Used  . Alcohol Use: No  . Drug Use: No  . Sexual Activity: Yes    Birth Control/ Protection: Surgical   Other Topics Concern  . None   Social History Narrative   Patient is widowed and lives alone.   Patient has two sons.   Patient is retired.   Patient has a college education.   Patient is right-handed.   Patient  drinks three glasses of Coke-soda daily.         Hospital Course: 75 year old widowed female, who reports worsening depression. States her adult son expressed concern regarding how depressed she was , and chart notes indicate that these was concern about decreasing attention to ADLs as well. Son brought her to the hospital. She describes chronic depression, which has been worsening. She states she has been isolating, keeping to herself, socializing less than before.Describes neuro-vegetative symptoms of depression as below. She had a surgery for an ankle fracture last year, after which she needed to ambulate using a wheel  chair for a period of time, later progressed to a walker, which is what she has been using at home up to admission. States that chronic pain is a contributor to her depression. At this time patient is focused on medication issues, and reports she is very concerned because she is not getting Neurontin, Ultramat the doses that she was taking prior to admission. Therefore her pain has intensified since admission. States that because of increased pain she is even more depressed now.  Willeen was admitted to [facility] for worsening symptoms of depression. However, she blamed her depression on her chronic severe pain episodes, due to an injury to her ankle. Myshia believed that she will be less depressed if her pain is adequately managed with pain pills. During the course of her hospitalization, she was resumed on her pain medication & the other adjunct treatment for her pain. The choice of her antidepressant medication was chosen with her management in mind as well as restoring her mood stability. Fendi was medicated & discharged on; Cymbalta 60 mg for depression/pain management, Neurontin 300 mg for agitation/pain management, Trazodone 50 mg for insomnia & Lidocaine patch for pain management. She was resumed on all of her pertinent home medications for the other pre-existing medical issues  presented. She tolerated her treatment regimen without any adverse effects or reactions. Jakita was enrolled in the group counseling sessions being offered & held on this unit. She seldom participated blaming her poor attendance to her pain episodes.   During the course of her hospitalization, Negin was evaluated on daily basis by the treatment team for mood stability & plans for continued recovery after discharge. Trishelle's motivation was not much an integral factor in her mood stability because she stated that her depression was secondary to her chronic pain issues. As a result, she refused to participate in the group counseling sessions most of the time. She spent most of times & days in bed in her room. Her discharge plans included a refferal & an appointment to psychiatric clinic for medication management/counseling services & [facility] for pain management issues as listed below. Upon discharge, Aidana was both mentally & medically stable for discharge denying suicidal/homicidal ideation, auditory/visual/tactile hallucinations, delusional thoughts or paranoia. She left [facility] with all personal belongings in no apparent distress. Transportation per son. Physical Findings: AIMS: Facial and Oral Movements Muscles of Facial Expression: None, normal Lips and Perioral Area: None, normal Jaw: None, normal Tongue: None, normal,Extremity Movements Upper (arms, wrists, hands, fingers): None, normal Lower (legs, knees, ankles, toes): None, normal, Trunk Movements Neck, shoulders, hips: None, normal, Overall Severity Severity of abnormal movements (highest score from questions above): None, normal Incapacitation due to abnormal movements: None, normal Patient's awareness of abnormal movements (rate only patient's report): No Awareness, Dental Status Current problems with teeth and/or dentures?: No Does patient usually wear dentures?: No  CIWA:    COWS:     Musculoskeletal: Strength &  Muscle Tone: within normal limits Gait & Station: normal Patient leans: N/A  Psychiatric Specialty Exam: Review of Systems  Constitutional: Negative.   HENT: Negative.   Eyes: Negative.   Respiratory: Negative.   Cardiovascular: Negative.   Gastrointestinal: Negative.   Genitourinary: Negative.   Musculoskeletal: Positive for myalgias (Hx. of & currently), back pain (Hx of & currently), joint pain ( Hx of & currently) and falls (Hx of).  Skin: Negative.   Neurological: Negative.   Endo/Heme/Allergies: Negative.   Psychiatric/Behavioral: Positive for depression (Stable) and substance abuse (May have  been over using her pain medications). Negative for suicidal ideas, hallucinations and memory loss. The patient has insomnia (Stable). The patient is not nervous/anxious.     Blood pressure 144/83, pulse 98, temperature 98.3 F (36.8 C), temperature source Oral, resp. rate 16, height 5\' 3"  (1.6 m), weight 88.905 kg (196 lb), SpO2 99 %.Body mass index is 34.73 kg/(m^2).  See Md's SRA   Have you used any form of tobacco in the last 30 days? (Cigarettes, Smokeless Tobacco, Cigars, and/or Pipes): No  Has this patient used any form of tobacco in the last 30 days? (Cigarettes, Smokeless Tobacco, Cigars, and/or Pipes): No  Metabolic Disorder Labs:  Lab Results  Component Value Date   HGBA1C 6.1* 06/18/2015   MPG 128 06/18/2015   MPG 143 04/20/2015   No results found for: PROLACTIN Lab Results  Component Value Date   CHOL 218* 06/18/2015   TRIG 206* 06/18/2015   HDL 40* 06/18/2015   CHOLHDL 5.5 06/18/2015   VLDL 41* 06/18/2015   LDLCALC 137* 06/18/2015   LDLCALC 110* 11/11/2014   See Psychiatric Specialty Exam and Suicide Risk Assessment completed by Attending Physician prior to discharge.  Discharge destination:  Home  Is patient on multiple antipsychotic therapies at discharge:  No   Has Patient had three or more failed trials of antipsychotic monotherapy by history:   No  Recommended Plan for Multiple Antipsychotic Therapies: NA    Medication List    STOP taking these medications        acetaminophen 325 MG tablet  Commonly known as:  TYLENOL     AFRIN ALLERGY NA     amoxicillin 500 MG capsule  Commonly known as:  AMOXIL     calcium carbonate 1250 MG capsule     cloNIDine 0.1 MG tablet  Commonly known as:  CATAPRES     fluticasone 50 MCG/ACT nasal spray  Commonly known as:  FLONASE     gabapentin 600 MG tablet  Commonly known as:  NEURONTIN  Replaced by:  gabapentin 300 MG capsule     hydrochlorothiazide 25 MG tablet  Commonly known as:  HYDRODIURIL     LORazepam 1 MG tablet  Commonly known as:  ATIVAN     sertraline 50 MG tablet  Commonly known as:  ZOLOFT     temazepam 7.5 MG capsule  Commonly known as:  RESTORIL     traMADol 50 MG tablet  Commonly known as:  ULTRAM      TAKE these medications      Indication   aspirin 325 MG tablet  Take 1 tablet (325 mg total) by mouth daily. For heart health   Indication:  Heart health     atorvastatin 40 MG tablet  Commonly known as:  LIPITOR  Take one tablet by mouth once daily for cholesterol   Indication:  High Cholesterol     cholecalciferol 1000 units tablet  Commonly known as:  VITAMIN D  Take 1 tablet (1,000 Units total) by mouth daily. For bone health   Indication:  Bone health     docusate sodium 100 MG capsule  Commonly known as:  COLACE  Take 1 capsule (100 mg total) by mouth daily. For constipation   Indication:  Constipation     DULoxetine 60 MG capsule  Commonly known as:  CYMBALTA  Take 1 capsule (60 mg total) by mouth daily. For depression   Indication:  Major Depressive Disorder     gabapentin 300 MG capsule  Commonly known  as:  NEURONTIN  Take 2 capsules (600 mg total) by mouth 4 (four) times daily. For pain management/agitation   Indication:  Agitation, Neuropathic Pain, Restless Leg Syndrome     HYDROcodone-acetaminophen 5-325 MG tablet   Commonly known as:  NORCO/VICODIN  Take 1 tablet (5-325) mg every 8 hours (Must last 28 days): For pain   Indication:  Moderate to Moderately Severe Pain     lidocaine 5 %  Commonly known as:  LIDODERM  Place 1 patch onto the skin daily. Remove & Discard patch within 12 hours or as directed by MD   Indication:  Pain management     lisinopril 5 MG tablet  Commonly known as:  PRINIVIL,ZESTRIL  Take 1 tablet (5 mg total) by mouth daily. For high blood pressure   Indication:  High Blood Pressure     loratadine 10 MG tablet  Commonly known as:  CLARITIN  Take 1 tablet (10 mg total) by mouth daily. Foe allergies   Indication:  Hayfever     meclizine 25 MG tablet  Commonly known as:  ANTIVERT  Take one tablet by mouth once daily as needed for dizziness.   Indication:  Sensation of Spinning or Whirling     potassium chloride 10 MEQ tablet  Commonly known as:  K-DUR,KLOR-CON  Take 1 tablet (10 mEq total) by mouth daily. For low potassium   Indication:  Low Amount of Potassium in the Blood     pyridOXINE 50 MG tablet  Commonly known as:  VITAMIN B-6  Take 1 tablet (50 mg total) by mouth daily. For Vitamin B-6 supplement   Indication:  B-6 Supplement     sodium chloride 0.65 % Soln nasal spray  Commonly known as:  OCEAN  Place 1 spray into both nostrils as needed for congestion.   Indication:  Nasal congestion     SUMAtriptan 50 MG tablet  Commonly known as:  IMITREX  Take 50 mg by mouth as needed for migraine or headache   Indication:  Headache, Migraine Headache     traZODone 50 MG tablet  Commonly known as:  DESYREL  Take 1 tablet (50 mg total) by mouth at bedtime as needed for sleep.   Indication:  Trouble Sleeping       Follow-up Information    Follow up with [facility].   Why:  Please follow-up with your therapist Oneida Arenas. His first available appointment is 3/3 at 12:30pm.   Contact information:   [address], [place], [place] [address] [phone] Fax       Follow up with [facility] On 07/07/2015.   Why:  at 9:00am with Jose Persia for medication management. This practitioner would also be responsible for a pain management referral.   Contact information:   [place] [phone]      Follow up with [facility] On 06/29/2015.   Why:  for x-rays at 10:30am and for your follow-up with Dr. Ellene Route at 10:45am. Both appointments are completed on site.   Contact information:   [address] [place], [place] [address] Phone: [phone] Fax: [phone]     Follow-up recommendations:  Activity:  As tolerated Diet: As recommended by your primary care doctor. Keep all scheduled follow-up appointments as recommended.  Comments: Take all your medications as prescribed by your mental healthcare provider. Report any adverse effects and or reactions from your medicines to your outpatient  provider promptly. Patient is instructed and cautioned to not engage in alcohol and or illegal drug use while on prescription medicines. In the event of worsening symptoms, patient is instructed to call the crisis hotline, 911 and or go to the nearest ED for appropriate evaluation and treatment of symptoms. Follow-up with your primary care provider for your other medical issues, concerns and or health care needs.   Signed: Encarnacion Slates, PMHNP, FNP-BC 06/29/2015, 1:11 PM   Patient will be admitted to inpatient psychiatric unit for stabilization and safety. Will provide and encourage milieu participation. Provide medication management and maked adjustments as needed.  Will follow daily.

## 2015-06-28 NOTE — Progress Notes (Signed)
  [facility] Adult Case Management Discharge Plan :  Will you be returning to the same living situation after discharge:  Yes,  Pt returning home At discharge, do you have transportation home?: Yes,  Pt son to provide transportation Do you have the ability to pay for your medications: Yes,  Pt provided with prescriptions  Release of information consent forms completed and in the chart;  Patient's signature needed at discharge.  Patient to Follow up at: Follow-up Information    Follow up with [facility].   Why:  Please follow-up with your therapist Oneida Arenas. His first available appointment is 3/3 at 12:30pm.   Contact information:   [address] [place], [place] [address] [phone] Fax       Follow up with [facility] On 07/07/2015.   Why:  at 9:00am with Jose Persia for medication management. This practitioner would also be responsible for a pain management referral.   Contact information:   [place] [phone]      Follow up with [facility] On 06/29/2015.   Why:  for x-rays at 10:30am and for your follow-up with Dr. Ellene Route at 10:45am. Both appointments are completed on site.   Contact information:   [address] [place], [place] [address] Phone: [phone] Fax: [phone]      Next level of care provider has access to [facility] and Suicide Prevention discussed: Yes,  with son; see SPE note for further details  Have you used any form of tobacco in the last 30 days? (Cigarettes, Smokeless Tobacco, Cigars, and/or Pipes): No  Has patient been referred to the Quitline?: N/A patient is not a smoker  Patient has been referred for addiction treatment: N/A  Bo Mcclintock 06/28/2015, 3:48 PM

## 2015-06-29 MED ORDER — LIDOCAINE 5 % EX PTCH: 1.0000 | MEDICATED_PATCH | Freq: Every day | CUTANEOUS | Status: DC

## 2015-07-01 ENCOUNTER — Telehealth: Payer: Self-pay | Admitting: *Deleted

## 2015-07-01 ENCOUNTER — Encounter (HOSPITAL_COMMUNITY): Payer: Self-pay | Admitting: Emergency Medicine

## 2015-07-01 ENCOUNTER — Inpatient Hospital Stay (HOSPITAL_COMMUNITY)
Admission: EM | Admit: 2015-07-01 | Discharge: 2015-07-04 | DRG: 093 | Disposition: A | Discharge status: Home / Self Care | Payer: Medicare Other | Attending: Internal Medicine | Admitting: Internal Medicine

## 2015-07-01 ENCOUNTER — Emergency Department (HOSPITAL_COMMUNITY): Payer: Medicare Other

## 2015-07-01 DIAGNOSIS — Z8249 Family history of ischemic heart disease and other diseases of the circulatory system: Secondary | ICD-10-CM

## 2015-07-01 DIAGNOSIS — Z6834 Body mass index (BMI) 34.0-34.9, adult: Secondary | ICD-10-CM | POA: Diagnosis not present

## 2015-07-01 DIAGNOSIS — Z8261 Family history of arthritis: Secondary | ICD-10-CM

## 2015-07-01 DIAGNOSIS — E875 Hyperkalemia: Secondary | ICD-10-CM | POA: Diagnosis present

## 2015-07-01 DIAGNOSIS — Z9049 Acquired absence of other specified parts of digestive tract: Secondary | ICD-10-CM

## 2015-07-01 DIAGNOSIS — Z820 Family history of epilepsy and other diseases of the nervous system: Secondary | ICD-10-CM | POA: Diagnosis not present

## 2015-07-01 DIAGNOSIS — S199XXA Unspecified injury of neck, initial encounter: Secondary | ICD-10-CM | POA: Diagnosis not present

## 2015-07-01 DIAGNOSIS — G92 Toxic encephalopathy: Principal | ICD-10-CM | POA: Diagnosis present

## 2015-07-01 DIAGNOSIS — G43909 Migraine, unspecified, not intractable, without status migrainosus: Secondary | ICD-10-CM | POA: Diagnosis not present

## 2015-07-01 DIAGNOSIS — F32A Depression, unspecified: Secondary | ICD-10-CM | POA: Diagnosis present

## 2015-07-01 DIAGNOSIS — R441 Visual hallucinations: Secondary | ICD-10-CM | POA: Diagnosis present

## 2015-07-01 DIAGNOSIS — I10 Essential (primary) hypertension: Secondary | ICD-10-CM

## 2015-07-01 DIAGNOSIS — I517 Cardiomegaly: Secondary | ICD-10-CM | POA: Diagnosis not present

## 2015-07-01 DIAGNOSIS — G629 Polyneuropathy, unspecified: Secondary | ICD-10-CM | POA: Diagnosis not present

## 2015-07-01 DIAGNOSIS — Z833 Family history of diabetes mellitus: Secondary | ICD-10-CM

## 2015-07-01 DIAGNOSIS — F29 Unspecified psychosis not due to a substance or known physiological condition: Secondary | ICD-10-CM | POA: Diagnosis not present

## 2015-07-01 DIAGNOSIS — E785 Hyperlipidemia, unspecified: Secondary | ICD-10-CM | POA: Diagnosis not present

## 2015-07-01 DIAGNOSIS — Z888 Allergy status to other drugs, medicaments and biological substances status: Secondary | ICD-10-CM | POA: Diagnosis not present

## 2015-07-01 DIAGNOSIS — G2581 Restless legs syndrome: Secondary | ICD-10-CM | POA: Diagnosis present

## 2015-07-01 DIAGNOSIS — F419 Anxiety disorder, unspecified: Secondary | ICD-10-CM | POA: Diagnosis present

## 2015-07-01 DIAGNOSIS — Z96651 Presence of right artificial knee joint: Secondary | ICD-10-CM | POA: Diagnosis present

## 2015-07-01 DIAGNOSIS — G459 Transient cerebral ischemic attack, unspecified: Secondary | ICD-10-CM | POA: Diagnosis present

## 2015-07-01 DIAGNOSIS — G9340 Encephalopathy, unspecified: Secondary | ICD-10-CM | POA: Diagnosis not present

## 2015-07-01 DIAGNOSIS — R296 Repeated falls: Secondary | ICD-10-CM | POA: Diagnosis present

## 2015-07-01 DIAGNOSIS — G4733 Obstructive sleep apnea (adult) (pediatric): Secondary | ICD-10-CM | POA: Diagnosis not present

## 2015-07-01 DIAGNOSIS — M5136 Other intervertebral disc degeneration, lumbar region: Secondary | ICD-10-CM | POA: Diagnosis not present

## 2015-07-01 DIAGNOSIS — E669 Obesity, unspecified: Secondary | ICD-10-CM | POA: Diagnosis present

## 2015-07-01 DIAGNOSIS — Z882 Allergy status to sulfonamides status: Secondary | ICD-10-CM | POA: Diagnosis not present

## 2015-07-01 DIAGNOSIS — F4489 Other dissociative and conversion disorders: Secondary | ICD-10-CM | POA: Diagnosis not present

## 2015-07-01 DIAGNOSIS — S0990XA Unspecified injury of head, initial encounter: Secondary | ICD-10-CM | POA: Diagnosis not present

## 2015-07-01 DIAGNOSIS — W19XXXA Unspecified fall, initial encounter: Secondary | ICD-10-CM | POA: Diagnosis not present

## 2015-07-01 DIAGNOSIS — M549 Dorsalgia, unspecified: Secondary | ICD-10-CM | POA: Diagnosis present

## 2015-07-01 DIAGNOSIS — R410 Disorientation, unspecified: Secondary | ICD-10-CM | POA: Diagnosis present

## 2015-07-01 DIAGNOSIS — K219 Gastro-esophageal reflux disease without esophagitis: Secondary | ICD-10-CM | POA: Diagnosis not present

## 2015-07-01 DIAGNOSIS — G8929 Other chronic pain: Secondary | ICD-10-CM | POA: Diagnosis not present

## 2015-07-01 DIAGNOSIS — G4700 Insomnia, unspecified: Secondary | ICD-10-CM | POA: Diagnosis not present

## 2015-07-01 DIAGNOSIS — E860 Dehydration: Secondary | ICD-10-CM | POA: Diagnosis not present

## 2015-07-01 DIAGNOSIS — Z90710 Acquired absence of both cervix and uterus: Secondary | ICD-10-CM | POA: Diagnosis not present

## 2015-07-01 DIAGNOSIS — Z8673 Personal history of transient ischemic attack (TIA), and cerebral infarction without residual deficits: Secondary | ICD-10-CM | POA: Diagnosis not present

## 2015-07-01 DIAGNOSIS — Z91041 Radiographic dye allergy status: Secondary | ICD-10-CM | POA: Diagnosis not present

## 2015-07-01 DIAGNOSIS — F329 Major depressive disorder, single episode, unspecified: Secondary | ICD-10-CM | POA: Diagnosis present

## 2015-07-01 LAB — CBC WITH DIFFERENTIAL/PLATELET
BASOS PCT: 0 %
Basophils Absolute: 0 10*3/uL (ref 0.0–0.1)
EOS PCT: 2 %
Eosinophils Absolute: 0.2 10*3/uL (ref 0.0–0.7)
HEMATOCRIT: 36.6 % (ref 36.0–46.0)
Hemoglobin: 12.3 g/dL (ref 12.0–15.0)
LYMPHS ABS: 2.6 10*3/uL (ref 0.7–4.0)
Lymphocytes Relative: 25 %
MCH: 31.9 pg (ref 26.0–34.0)
MCHC: 33.6 g/dL (ref 30.0–36.0)
MCV: 95.1 fL (ref 78.0–100.0)
MONOS PCT: 6 %
Monocytes Absolute: 0.6 10*3/uL (ref 0.1–1.0)
NEUTROS ABS: 7 10*3/uL (ref 1.7–7.7)
Neutrophils Relative %: 67 %
Platelets: 371 10*3/uL (ref 150–400)
RBC: 3.85 MIL/uL — ABNORMAL LOW (ref 3.87–5.11)
RDW: 14.7 % (ref 11.5–15.5)
WBC: 10.4 10*3/uL (ref 4.0–10.5)

## 2015-07-01 LAB — COMPREHENSIVE METABOLIC PANEL
ALT: 11 U/L — ABNORMAL LOW (ref 14–54)
ANION GAP: 12 (ref 5–15)
AST: 53 U/L — AB (ref 15–41)
Albumin: 3.1 g/dL — ABNORMAL LOW (ref 3.5–5.0)
Alkaline Phosphatase: 109 U/L (ref 38–126)
BILIRUBIN TOTAL: 1.4 mg/dL — AB (ref 0.3–1.2)
BUN: 15 mg/dL (ref 6–20)
CO2: 23 mmol/L (ref 22–32)
Calcium: 9.1 mg/dL (ref 8.9–10.3)
Chloride: 101 mmol/L (ref 101–111)
Creatinine, Ser: 1.18 mg/dL — ABNORMAL HIGH (ref 0.44–1.00)
GFR calc Af Amer: 52 mL/min — ABNORMAL LOW (ref >60)
GFR, EST NON AFRICAN AMERICAN: 45 mL/min — AB (ref >60)
Glucose, Bld: 90 mg/dL (ref 65–99)
POTASSIUM: 5.4 mmol/L — AB (ref 3.5–5.1)
Sodium: 136 mmol/L (ref 135–145)
TOTAL PROTEIN: 6.1 g/dL — AB (ref 6.5–8.1)

## 2015-07-01 LAB — RAPID URINE DRUG SCREEN, HOSP PERFORMED
AMPHETAMINES: POSITIVE — AB
BARBITURATES: NOT DETECTED
BENZODIAZEPINES: POSITIVE — AB
COCAINE: NOT DETECTED
Opiates: NOT DETECTED
TETRAHYDROCANNABINOL: NOT DETECTED

## 2015-07-01 LAB — URINALYSIS, ROUTINE W REFLEX MICROSCOPIC
BILIRUBIN URINE: NEGATIVE
GLUCOSE, UA: NEGATIVE mg/dL
HGB URINE DIPSTICK: NEGATIVE
Ketones, ur: NEGATIVE mg/dL
Leukocytes, UA: NEGATIVE
Nitrite: NEGATIVE
PROTEIN: NEGATIVE mg/dL
Specific Gravity, Urine: 1.017 (ref 1.005–1.030)
pH: 5 (ref 5.0–8.0)

## 2015-07-01 LAB — ETHANOL: Alcohol, Ethyl (B): <5 mg/dL (ref <5)

## 2015-07-01 LAB — ACETAMINOPHEN LEVEL: Acetaminophen (Tylenol), Serum: <10 ug/mL — ABNORMAL LOW (ref 10–30)

## 2015-07-01 LAB — I-STAT TROPONIN, ED: TROPONIN I, POC: 0 ng/mL (ref 0.00–0.08)

## 2015-07-01 LAB — SALICYLATE LEVEL

## 2015-07-01 MED ORDER — ATORVASTATIN CALCIUM 40 MG PO TABS
40.0000 mg | ORAL_TABLET | Freq: Every day | ORAL | Status: DC
  Administered 2015-07-02 – 2015-07-03 (×2): 40 mg via ORAL
  Filled 2015-07-01 (×2): qty 1

## 2015-07-01 MED ORDER — GABAPENTIN 300 MG PO CAPS
600.0000 mg | ORAL_CAPSULE | Freq: Four times a day (QID) | ORAL | Status: DC
  Administered 2015-07-02: 600 mg via ORAL
  Filled 2015-07-01: qty 2

## 2015-07-01 MED ORDER — SODIUM CHLORIDE 0.9% FLUSH
3.0000 mL | Freq: Two times a day (BID) | INTRAVENOUS | Status: DC
Start: 2015-07-01 — End: 2015-07-02
  Administered 2015-07-02 (×2): 3 mL via INTRAVENOUS

## 2015-07-01 MED ORDER — SODIUM CHLORIDE 0.9 % IV BOLUS (SEPSIS)
1000.0000 mL | Freq: Once | INTRAVENOUS | Status: AC
  Administered 2015-07-01: 1000 mL via INTRAVENOUS

## 2015-07-01 MED ORDER — SALINE SPRAY 0.65 % NA SOLN
1.0000 | NASAL | Status: DC | PRN
  Administered 2015-07-03: 1 via NASAL
  Filled 2015-07-01: qty 44

## 2015-07-01 MED ORDER — ACETAMINOPHEN 650 MG RE SUPP: 650.0000 mg | Freq: Four times a day (QID) | RECTAL | Status: DC | PRN

## 2015-07-01 MED ORDER — SUMATRIPTAN SUCCINATE 50 MG PO TABS
50.0000 mg | ORAL_TABLET | Freq: Two times a day (BID) | ORAL | Status: DC | PRN
  Filled 2015-07-01: qty 1

## 2015-07-01 MED ORDER — HYDROCODONE-ACETAMINOPHEN 5-325 MG PO TABS: 1.0000 | ORAL_TABLET | Freq: Four times a day (QID) | ORAL | Status: DC | PRN

## 2015-07-01 MED ORDER — ASPIRIN 325 MG PO TABS: 325.0000 mg | ORAL_TABLET | Freq: Every day | ORAL | Status: DC

## 2015-07-01 MED ORDER — SODIUM CHLORIDE 0.9 % IV SOLN
INTRAVENOUS | Status: DC
  Administered 2015-07-02: 03:00:00 via INTRAVENOUS

## 2015-07-01 MED ORDER — TRAZODONE HCL 50 MG PO TABS: 50.0000 mg | ORAL_TABLET | Freq: Every evening | ORAL | Status: DC | PRN

## 2015-07-01 MED ORDER — ENOXAPARIN SODIUM 40 MG/0.4ML ~~LOC~~ SOLN
40.0000 mg | SUBCUTANEOUS | Status: DC
  Administered 2015-07-02 – 2015-07-04 (×3): 40 mg via SUBCUTANEOUS
  Filled 2015-07-01 (×3): qty 0.4

## 2015-07-01 MED ORDER — LIDOCAINE 5 % EX PTCH: 1.0000 | MEDICATED_PATCH | Freq: Every day | CUTANEOUS | Status: DC

## 2015-07-01 MED ORDER — STROKE: EARLY STAGES OF RECOVERY BOOK
Freq: Once | Status: AC
  Administered 2015-07-01
  Filled 2015-07-01: qty 1

## 2015-07-01 MED ORDER — MECLIZINE HCL 25 MG PO TABS
25.0000 mg | ORAL_TABLET | Freq: Two times a day (BID) | ORAL | Status: DC | PRN
  Filled 2015-07-01: qty 1

## 2015-07-01 MED ORDER — ASPIRIN 325 MG PO TABS
325.0000 mg | ORAL_TABLET | Freq: Every day | ORAL | Status: DC
  Administered 2015-07-02 – 2015-07-04 (×3): 325 mg via ORAL
  Filled 2015-07-01 (×3): qty 1

## 2015-07-01 MED ORDER — ASPIRIN 300 MG RE SUPP: 300.0000 mg | Freq: Every day | RECTAL | Status: DC

## 2015-07-01 MED ORDER — VITAMIN B-6 50 MG PO TABS
50.0000 mg | ORAL_TABLET | Freq: Every day | ORAL | Status: DC
  Administered 2015-07-02 – 2015-07-04 (×3): 50 mg via ORAL
  Filled 2015-07-01 (×3): qty 1

## 2015-07-01 MED ORDER — DULOXETINE HCL 60 MG PO CPEP
60.0000 mg | ORAL_CAPSULE | Freq: Every day | ORAL | Status: DC
  Administered 2015-07-02 – 2015-07-04 (×3): 60 mg via ORAL
  Filled 2015-07-01 (×3): qty 1

## 2015-07-01 MED ORDER — VITAMIN D 1000 UNITS PO TABS
1000.0000 [IU] | ORAL_TABLET | Freq: Every day | ORAL | Status: DC
  Administered 2015-07-02 – 2015-07-04 (×3): 1000 [IU] via ORAL
  Filled 2015-07-01 (×3): qty 1

## 2015-07-01 MED ORDER — DOCUSATE SODIUM 100 MG PO CAPS
100.0000 mg | ORAL_CAPSULE | Freq: Every day | ORAL | Status: DC
  Administered 2015-07-02 – 2015-07-04 (×3): 100 mg via ORAL
  Filled 2015-07-01 (×3): qty 1

## 2015-07-01 MED ORDER — ACETAMINOPHEN 325 MG PO TABS
650.0000 mg | ORAL_TABLET | Freq: Four times a day (QID) | ORAL | Status: DC | PRN
  Administered 2015-07-02 – 2015-07-04 (×6): 650 mg via ORAL
  Filled 2015-07-01 (×7): qty 2

## 2015-07-01 MED ORDER — SODIUM POLYSTYRENE SULFONATE 15 GM/60ML PO SUSP
15.0000 g | Freq: Once | ORAL | Status: AC
  Administered 2015-07-02: 15 g via ORAL
  Filled 2015-07-01: qty 60

## 2015-07-01 MED ORDER — HALOPERIDOL LACTATE 5 MG/ML IJ SOLN
1.0000 mg | Freq: Once | INTRAMUSCULAR | Status: AC
  Administered 2015-07-02: 1 mg via INTRAVENOUS
  Filled 2015-07-01: qty 1

## 2015-07-01 MED ORDER — HYDRALAZINE HCL 20 MG/ML IJ SOLN
5.0000 mg | INTRAMUSCULAR | Status: DC | PRN
  Administered 2015-07-03: 5 mg via INTRAVENOUS
  Filled 2015-07-01 (×2): qty 1

## 2015-07-01 NOTE — ED Notes (Signed)
Will attempt to cath pt when lab gets done trying to drawn blood.

## 2015-07-01 NOTE — ED Notes (Signed)
Pt here via EMS- pt lives at home alone. Pt son at bedside reporting that pt is experiencing increasing falls, hallucinations, increased altered mental status. Pt son reports bizarre behaviors . Pt son reports that she was admitted to [facility] for 1 week and refused to participate in any therapy. He reports worsening depression since her husband died 6 years ago. He also questions polypharmacy.

## 2015-07-01 NOTE — H&P (Signed)
[facility] History and Physical  [name] [id number] DOB: 1941-04-03 DOA: 07/01/2015  Referring physician: ED physician PCP: Hollace Kinnier, DO  Specialists:   Chief Complaint: AMS, fall and visual hallucination  HPI: [name] is a 75 y.o. female with PMH of TIA, hypertension, hyperlipidemia, depression, anxiety, stroke, vertigo, chronic back pain, peripheral neuropathy, OSA, obesity, TIA, who presents with altered mental status, fall and visual hallucination.  Patient has AMS, and is unable to provide accurate medical history, therefore, most of the history is obtained by discussing the case with ED physician, per EMS report, and with the nursing staff. Per report, she was recently admitted to behavioral health due to severe depression and discharged 3 days ago. She has been living at home by herself. As per the son, patient has 4 episodes of falling since 3 AM yesterday. Patient states that she just feels diffusely weak. Denies passing out. As per the son, patient has been confused and having visual hallucinations, seeing things on the wall. She has questionable slurred speech. Patient does not have chest pain, abdominal pain, diarrhea, symptoms of UTI, unilateral weakness, numbness or kidney sensations. No hearing loss.  In ED, patient was found to have negative urinalysis, WBC 10.4, temperature normal, no tachycardia, potassium 5.4 without T-wave peaking, worsening renal function, Tylenol level less than 10, salicylate level less than 4, negative troponin, Penny UDS. Negative chest x-ray, negative CT head and C-spine for acute abnormalities. Patient is admitted to inpatient for further eval and treatment.  EKG: Independently reviewed. QTC 450, mild T-wave inversion in inferior leads and v3-V6, low voltage.  Where does patient live?   At home   Can patient participate in ADLs? little  Review of Systems: Could not be accurately reviewed due to altered mental  status.  Allergy:  Allergies  Allergen Reactions  . Contrast Media [Iodinated Diagnostic Agents] Other (See Comments)    Respiratory failure  . Acyclovir And Related Other (See Comments)    unknown  . Sulfa Drugs Cross Reactors Nausea And Vomiting    Past Medical History  Diagnosis Date  . GERD (gastroesophageal reflux disease)     hx pud '98  . Arthritis     djd  . Restless leg syndrome     takes Sinemet daily  . 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 Dysrhythmia     hx tachy palpitations  . Neuromuscular disorder ([place])     peripheral neuropathy, FEET AND LEGS  . Stroke [facility])     ? 6 months ago - tests okay - [facility]   . Cellulitis     10/11/11 hospitalized for cellulitis   . Hyperlipemia     takes Atorvastatin daily  . Peripheral neuropathy ([place])   . Elbow fracture, left April 2015  . Depression     takes Zoloft daily  . Vertigo     takes Meclizine daily as needed  . Insomnia     takes restoril nightly as needed  . Hypertension     takes HCTZ daily  . Peripheral neuropathy (HCC)     takes Gabapentin daily  . History of bronchitis     many yrs ago  . Headache(784.0)     migraines yrs ago-takes Imitrex daily  . Joint pain   . Joint swelling   . Chronic back pain     scoliosis/stenosis  . Anxiety     takes Ativan daily as needed  . Cataracts, bilateral  immature   . Depression     Past Surgical History  Procedure Laterality Date  . Cervical disc surgery x2  2011  . Rt carotid enarterectomy  2011  . Rt knee arthroscopy  '99  . Left knee arthroscopy  2001  . Hematoma evacuation  2011    s/p rt cea  . Joint replacement  '01    total knee replacement, LEFT  . Abdominal hysterectomy  1975    bil oophorectomy  . Back surgery   MANY YRS AGO    laminectomy x3  . Cholecystectomy  1993  . Total knee arthroplasty  09/13/2011    Procedure: TOTAL KNEE ARTHROPLASTY;  Surgeon: Magnus Sinning, MD;  Location: [facility];  Service: Orthopedics;   Laterality: Right;  . Right knee replacement      09/2011   . Carpal tunnel release  07/09/2012    Procedure: CARPAL TUNNEL RELEASE;  Surgeon: Magnus Sinning, MD;  Location: [facility];  Service: Orthopedics;  Laterality: Left;  . Finger arthroplasty  07/09/2012    Procedure: FINGER ARTHROPLASTY;  Surgeon: Magnus Sinning, MD;  Location: [facility];  Service: Orthopedics;  Laterality: Left;  Interposition Arthroplasty [facility] Joint Thumb Left   . Lipoma excision  07/09/2012    Procedure: EXCISION LIPOMA;  Surgeon: Magnus Sinning, MD;  Location: [facility];  Service: Orthopedics;  Laterality: Left;  Excision Lipoma Dorsum Left Wrist   . Orif ankle fracture Right 09/14/2014    FIBULA   . Orif ankle fracture Right 09/14/2014    Procedure: OPEN REDUCTION INTERNAL FIXATION (ORIF) RIGHT ANKLE FRACTURE/SYNDESMOSIS ;  Surgeon: Wylene Simmer, MD;  Location: [place];  Service: Orthopedics;  Laterality: Right;  . Colonosocpy    . Esophagogastroduodenoscopy    . Lumbar laminectomy with coflex 1 level Bilateral 04/19/2015    Procedure: LUMBAR TWO-THREE LUMBAR LAMINECTOMY WITH COFLEX ;  Surgeon: Kristeen Miss, MD;  Location: [place] [facility];  Service: Neurosurgery;  Laterality: Bilateral;  Bilateral L23 laminectomy and foraminotomy with coflex    Social History:  reports that she has never smoked. She has never used smokeless tobacco. She reports that she does not drink alcohol or use illicit drugs.  Family History:  Family History  Problem Relation Age of Onset  . Congestive Heart Failure Father   . Congestive Heart Failure Sister   . Alzheimer's disease Mother   . Diabetes Brother   . Arthritis Brother      Prior to Admission medications   Medication Sig Start Date End Date Taking? Authorizing Provider  aspirin 325 MG tablet Take 1 tablet (325 mg total) by mouth daily. For heart health 06/28/15  Yes Encarnacion Slates, NP  atorvastatin (LIPITOR) 40 MG tablet Take one tablet by mouth once daily for cholesterol 06/28/15  Yes  Encarnacion Slates, NP  cholecalciferol (VITAMIN D) 1000 units tablet Take 1 tablet (1,000 Units total) by mouth daily. For bone health 06/28/15  Yes Encarnacion Slates, NP  docusate sodium (COLACE) 100 MG capsule Take 1 capsule (100 mg total) by mouth daily. For constipation 06/28/15  Yes Encarnacion Slates, NP  DULoxetine (CYMBALTA) 60 MG capsule Take 1 capsule (60 mg total) by mouth daily. For depression 06/28/15  Yes Encarnacion Slates, NP  gabapentin (NEURONTIN) 300 MG capsule Take 2 capsules (600 mg total) by mouth 4 (four) times daily. For pain management/agitation 06/28/15  Yes Encarnacion Slates, NP  HYDROcodone-acetaminophen (NORCO/VICODIN) 5-325 MG tablet Take 1 tablet (5-325) mg every 8  hours (Must last 28 days): For pain 06/28/15  Yes Encarnacion Slates, NP  lidocaine (LIDODERM) 5 % Place 1 patch onto the skin daily. Remove & Discard patch within 12 hours or as directed by MD 06/29/15  Yes Encarnacion Slates, NP  lisinopril (PRINIVIL,ZESTRIL) 5 MG tablet Take 1 tablet (5 mg total) by mouth daily. For high blood pressure 06/28/15  Yes Encarnacion Slates, NP  loratadine (CLARITIN) 10 MG tablet Take 1 tablet (10 mg total) by mouth daily. Foe allergies 06/28/15  Yes Encarnacion Slates, NP  meclizine (ANTIVERT) 25 MG tablet Take one tablet by mouth once daily as needed for dizziness. 06/28/15  Yes Encarnacion Slates, NP  potassium chloride (K-DUR,KLOR-CON) 10 MEQ tablet Take 1 tablet (10 mEq total) by mouth daily. For low potassium 06/28/15  Yes Encarnacion Slates, NP  pyridOXINE (VITAMIN B-6) 50 MG tablet Take 1 tablet (50 mg total) by mouth daily. For Vitamin B-6 supplement 06/28/15  Yes Encarnacion Slates, NP  sodium chloride (OCEAN) 0.65 % SOLN nasal spray Place 1 spray into both nostrils as needed for congestion. 06/28/15  Yes Encarnacion Slates, NP  SUMAtriptan (IMITREX) 50 MG tablet Take 50 mg by mouth as needed for migraine or headache 06/28/15  Yes Encarnacion Slates, NP  traZODone (DESYREL) 50 MG tablet Take 1 tablet (50 mg total) by mouth at bedtime as needed for  sleep. 06/28/15  Yes Encarnacion Slates, NP    Physical Exam: Filed Vitals:   07/01/15 1729 07/01/15 1900 07/01/15 1930 07/01/15 2000  BP: 120/60 127/77 112/54 123/91  Pulse: 96 99 96 95  Temp: 98 F (36.7 C)     TempSrc: Oral     Resp: 14 16 13 16   Height:      Weight:      SpO2: 99% 98% 99% 100%   General: Not in acute distress HEENT:       Eyes: PERRL, EOMI, no scleral icterus.       ENT: No discharge from the ears and nose, no pharynx injection, no tonsillar enlargement.        Neck: No JVD, no bruit, no mass felt. Heme: No neck lymph node enlargement. Cardiac: S1/S2, RRR, No murmurs, No gallops or rubs. Pulm: No rales, wheezing, rhonchi or rubs. Abd: Soft, nondistended, nontender, no rebound pain, no organomegaly, BS present. Ext: No pitting leg edema bilaterally. 2+DP/PT pulse bilaterally. Musculoskeletal: No joint deformities, No joint redness or warmth, no limitation of ROM in spin. Skin: No rashes.  Neuro: Alert, confused oriented to place and person, but not to time, cranial nerves II-XII grossly intact, muscle strength 5/5 in all extremities, sensation to light touch intact. Negative Babinski's sign. Normal finger to nose test. Psych:  no suicidal or hemocidal ideation.  Labs on Admission:  Basic Metabolic Panel:  Recent Labs Lab 06/25/15 0656 07/01/15 2111  NA 137 136  K 4.1 5.4*  CL 101 101  CO2 24 23  GLUCOSE 124* 90  BUN 25* 15  CREATININE 0.91 1.18*  CALCIUM 9.3 9.1   Liver Function Tests:  Recent Labs Lab 07/01/15 2111  AST 53*  ALT 11*  ALKPHOS 109  BILITOT 1.4*  PROT 6.1*  ALBUMIN 3.1*   No results for input(s): LIPASE, AMYLASE in the last 168 hours. No results for input(s): AMMONIA in the last 168 hours. CBC:  Recent Labs Lab 06/25/15 0656 07/01/15 2111  WBC 8.6 10.4  NEUTROABS 4.1 7.0  HGB 13.0 12.3  HCT  40.0 36.6  MCV 94.3 95.1  PLT 326 371   Cardiac Enzymes: No results for input(s): CKTOTAL, CKMB, CKMBINDEX, TROPONINI in the  last 168 hours.  BNP (last 3 results) No results for input(s): BNP in the last 8760 hours.  ProBNP (last 3 results) No results for input(s): PROBNP in the last 8760 hours.  CBG: No results for input(s): GLUCAP in the last 168 hours.  Radiological Exams on Admission: Dg Chest 2 View  07/01/2015  CLINICAL DATA:  Altered mental status for 1 week. Initial encounter. EXAM: CHEST  2 VIEW COMPARISON:  PA and lateral chest 10/11/2011.  CT chest 09/17/2014. FINDINGS: Elevation of the right hemidiaphragm is unchanged. Lungs are clear. There is cardiomegaly. No pneumothorax or pleural effusion. IMPRESSION: Cardiomegaly without acute disease. Electronically Signed   By: Inge Rise M.D.   On: 07/01/2015 18:19   Ct Head Wo Contrast  07/01/2015  CLINICAL DATA:  Increasing falls, hallucinations, and altered mental status. EXAM: CT HEAD WITHOUT CONTRAST CT CERVICAL SPINE WITHOUT CONTRAST TECHNIQUE: Multidetector CT imaging of the head and cervical spine was performed following the standard protocol without intravenous contrast. Multiplanar CT image reconstructions of the cervical spine were also generated. COMPARISON:  11/05/2014 FINDINGS: CT HEAD FINDINGS Mild cerebral atrophy. No ventricular dilatation. Patchy low-attenuation changes in the deep white matter consistent with small vessel ischemia. No mass effect or midline shift. No abnormal extra-axial fluid collections. Gray-white matter junctions are distinct. Basal cisterns are not effaced. No evidence of acute intracranial hemorrhage. No depressed skull fractures. Mild mucosal thickening in the paranasal sinuses. No acute air-fluid levels. Mastoid air cells are not opacified. Vascular calcifications. CT CERVICAL SPINE FINDINGS Postoperative changes with anterior plate and screw fixation and intervertebral fusion from C5 through C7. There is discontinuity of the fixation plate between C5 and C6 but this appearance is unchanged since previous study. Bony  fusion appears intact. Normal alignment of the cervical spine. No vertebral compression deformities. No prevertebral soft tissue swelling. C1-2 articulation appears intact. Degenerative changes throughout the facet joints. No focal bone lesion or bone destruction. The upper esophagus is mildly distended and contains gas and presumably ingested material. Dysmotility or distal esophageal stricture not excluded. IMPRESSION: No acute intracranial abnormalities. Anterior plate and screw fixation with intervertebral fusion from C5 through C7. Fracture of the hardware plate from [id number], unchanged since previous studies. Degenerative changes in the cervical spine. No acute displaced fractures identified. Incidental note of mild dilatation and residual material in the upper esophagus. Esophageal dysmotility or distal stricture not excluded. Electronically Signed   By: Lucienne Capers M.D.   On: 07/01/2015 18:46   Ct Cervical Spine Wo Contrast  07/01/2015  CLINICAL DATA:  Increasing falls, hallucinations, and altered mental status. EXAM: CT HEAD WITHOUT CONTRAST CT CERVICAL SPINE WITHOUT CONTRAST TECHNIQUE: Multidetector CT imaging of the head and cervical spine was performed following the standard protocol without intravenous contrast. Multiplanar CT image reconstructions of the cervical spine were also generated. COMPARISON:  11/05/2014 FINDINGS: CT HEAD FINDINGS Mild cerebral atrophy. No ventricular dilatation. Patchy low-attenuation changes in the deep white matter consistent with small vessel ischemia. No mass effect or midline shift. No abnormal extra-axial fluid collections. Gray-white matter junctions are distinct. Basal cisterns are not effaced. No evidence of acute intracranial hemorrhage. No depressed skull fractures. Mild mucosal thickening in the paranasal sinuses. No acute air-fluid levels. Mastoid air cells are not opacified. Vascular calcifications. CT CERVICAL SPINE FINDINGS Postoperative changes with  anterior plate and screw fixation and intervertebral  fusion from C5 through C7. There is discontinuity of the fixation plate between C5 and C6 but this appearance is unchanged since previous study. Bony fusion appears intact. Normal alignment of the cervical spine. No vertebral compression deformities. No prevertebral soft tissue swelling. C1-2 articulation appears intact. Degenerative changes throughout the facet joints. No focal bone lesion or bone destruction. The upper esophagus is mildly distended and contains gas and presumably ingested material. Dysmotility or distal esophageal stricture not excluded. IMPRESSION: No acute intracranial abnormalities. Anterior plate and screw fixation with intervertebral fusion from C5 through C7. Fracture of the hardware plate from [id number], unchanged since previous studies. Degenerative changes in the cervical spine. No acute displaced fractures identified. Incidental note of mild dilatation and residual material in the upper esophagus. Esophageal dysmotility or distal stricture not excluded. Electronically Signed   By: Lucienne Capers M.D.   On: 07/01/2015 18:46    Assessment/Plan Principal Problem:   Acute encephalopathy Active Problems:   Fall   TIA (transient ischemic attack)   HTN (hypertension)   Hyperlipemia   Depression   Severe obesity (BMI >= 40) (HCC)   Degenerative disc disease, lumbar   Back pain   Visual hallucination   Acute encephalopathy: Etiology is not clear. Differential diagnoses include delirium, TIA/stroke, electrolytes disturbance, worsening renal function, psychosis, drug abuse and severe depression.  -will admit to tele bed (duet to hyperkalemia) -MRI of brain to r/o stroke -Haldol 1 mg x 1 for possible delirium -IV fluid for hyperkalemia -NPO until passing swallowing test -Aspirin -Frequent neuro check -f/u UDS  Fall: Etiology is not clear. Patient has vertigo, which may have partially contributed. Given her history of  TIAs/strokes, will rule out new stroke. -Follow-up MRI of the brain -PT/OT -prn meclizine for vertigo  Hyperkalemia: Potassium 5.4, likely due to worsening renal function. No EKG change. 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 Likely due to prerenal secondary to dehydration and continuation of ACEI - IVF as above - Check FeNa - Follow up renal function by BMP - Hold lisinopril  Hypertension: -Hold lisinopril due to hyperkalemia and worsening renal function -IV Hydralazine when necessary  HLD: Last LDL was 137 on 06/18/15 -Continue home medications: Lipitor  Depression: Discharged from [facility] 3 days ago. Patient does not seem to have severe depression to me. No suicidal or homicidal ideations. -Continue cymbalta  Back pain: -prn Norco  DVT ppx: SQ Lovenox  Code Status: Full code Family Communication: None at bed side.   Disposition Plan: Admit to inpatient   Date of Service 07/01/2015    Ivor Costa [facility] Pager [phone]  If 7PM-7AM, please contact night-coverage [email or web address] Password [email or web address] 07/01/2015, 11:40 PM

## 2015-07-01 NOTE — Telephone Encounter (Signed)
Dr. Seward Grater from [facility]. [phone]. Just saw patient. Son brought patient into office in a wheelchair and gown. Patient is disoriented and its 2001. The only thing she could identify was him, delusional and back in time being a nurse, patient was looking for ice pack to treat a patient. Son didn't feel like he could get her to the hospital on his own so they sent patient by ambulance. Dr. Seward Grater is Concerned about her going home to live by herself was released from hospital. Her son stated that patient has already fallen 4 times today. Patient displaying Cognitive gestures, like picking at the air and rolling up her gown.

## 2015-07-01 NOTE — ED Provider Notes (Signed)
CSN: [id number]     Arrival date & time 07/01/15  1726 History   First MD Initiated Contact with Patient 07/01/15 1727     Chief Complaint  Patient presents with  . Altered Mental Status  . Hallucinations     (Consider location/radiation/quality/duration/timing/severity/associated sxs/prior Treatment) The history is provided by the patient and a relative.  [name] is a 75 y.o. female hx of GERD, stroke, depression here presenting with weakness, falls, hallucinations. She was recently admitted to behavioral health and discharged 3 days ago. She has been living at home by herself. As per the son, patient has 4 episodes of falling since 3 AM yesterday. Patient states that she just feels diffusely weak. Denies passing out. As per the son, patient also has been having some visual hallucinations and has been seeing things. Has been more confused since yesterday as well. Possible slurred speech as well. Denies fever or vomiting. Has not been eating much for the last few days.    Past Medical History  Diagnosis Date  . GERD (gastroesophageal reflux disease)     hx pud '98  . Arthritis     djd  . Restless leg syndrome     takes Sinemet daily  . 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 Dysrhythmia     hx tachy palpitations  . Neuromuscular disorder ([place])     peripheral neuropathy, FEET AND LEGS  . Stroke [facility])     ? 6 months ago - tests okay - [facility]   . Cellulitis     10/11/11 hospitalized for cellulitis   . Hyperlipemia     takes Atorvastatin daily  . Peripheral neuropathy ([place])   . Elbow fracture, left April 2015  . Depression     takes Zoloft daily  . Vertigo     takes Meclizine daily as needed  . Insomnia     takes restoril nightly as needed  . Hypertension     takes HCTZ daily  . Peripheral neuropathy ([facility])     takes Gabapentin daily  . History of bronchitis     many yrs ago  . Headache(784.0)     migraines yrs ago-takes Imitrex daily  . Joint pain   .  Joint swelling   . Chronic back pain     scoliosis/stenosis  . Anxiety     takes Ativan daily as needed  . Cataracts, bilateral     immature   . Depression    Past Surgical History  Procedure Laterality Date  . Cervical disc surgery x2  2011  . Rt carotid enarterectomy  2011  . Rt knee arthroscopy  '99  . Left knee arthroscopy  2001  . Hematoma evacuation  2011    s/p rt cea  . Joint replacement  '01    total knee replacement, LEFT  . Abdominal hysterectomy  1975    bil oophorectomy  . Back surgery   MANY YRS AGO    laminectomy x3  . Cholecystectomy  1993  . Total knee arthroplasty  09/13/2011    Procedure: TOTAL KNEE ARTHROPLASTY;  Surgeon: Magnus Sinning, MD;  Location: [facility];  Service: Orthopedics;  Laterality: Right;  . Right knee replacement      09/2011   . Carpal tunnel release  07/09/2012    Procedure: CARPAL TUNNEL RELEASE;  Surgeon: Magnus Sinning, MD;  Location: [facility];  Service: Orthopedics;  Laterality: Left;  . Finger arthroplasty  07/09/2012    Procedure: FINGER ARTHROPLASTY;  Surgeon: Magnus Sinning, MD;  Location: [facility];  Service: Orthopedics;  Laterality: Left;  Interposition Arthroplasty [facility] Joint Thumb Left   . Lipoma excision  07/09/2012    Procedure: EXCISION LIPOMA;  Surgeon: Magnus Sinning, MD;  Location: [facility];  Service: Orthopedics;  Laterality: Left;  Excision Lipoma Dorsum Left Wrist   . Orif ankle fracture Right 09/14/2014    FIBULA   . Orif ankle fracture Right 09/14/2014    Procedure: OPEN REDUCTION INTERNAL FIXATION (ORIF) RIGHT ANKLE FRACTURE/SYNDESMOSIS ;  Surgeon: Wylene Simmer, MD;  Location: [facility];  Service: Orthopedics;  Laterality: Right;  . Colonosocpy    . Esophagogastroduodenoscopy    . Lumbar laminectomy with coflex 1 level Bilateral 04/19/2015    Procedure: LUMBAR TWO-THREE LUMBAR LAMINECTOMY WITH COFLEX ;  Surgeon: Kristeen Miss, MD;  Location: [facility] [facility];  Service: Neurosurgery;  Laterality: Bilateral;  Bilateral L23  laminectomy and foraminotomy with coflex   Family History  Problem Relation Age of Onset  . Congestive Heart Failure Father   . Congestive Heart Failure Sister   . Alzheimer's disease Mother   . Diabetes Brother   . Arthritis Brother    Social History  Substance Use Topics  . Smoking status: Never Smoker   . Smokeless tobacco: Never Used  . Alcohol Use: No   OB History    No data available     Review of Systems  Unable to perform ROS: Mental status change  Neurological: Positive for weakness.  All other systems reviewed and are negative.     Allergies  Contrast media; Acyclovir and related; and Sulfa drugs cross reactors  Home Medications   Prior to Admission medications   Medication Sig Start Date End Date Taking? Authorizing Provider  aspirin 325 MG tablet Take 1 tablet (325 mg total) by mouth daily. For heart health 06/28/15  Yes Encarnacion Slates, NP  atorvastatin (LIPITOR) 40 MG tablet Take one tablet by mouth once daily for cholesterol 06/28/15  Yes Encarnacion Slates, NP  cholecalciferol (VITAMIN D) 1000 units tablet Take 1 tablet (1,000 Units total) by mouth daily. For bone health 06/28/15  Yes Encarnacion Slates, NP  docusate sodium (COLACE) 100 MG capsule Take 1 capsule (100 mg total) by mouth daily. For constipation 06/28/15  Yes Encarnacion Slates, NP  DULoxetine (CYMBALTA) 60 MG capsule Take 1 capsule (60 mg total) by mouth daily. For depression 06/28/15  Yes Encarnacion Slates, NP  gabapentin (NEURONTIN) 300 MG capsule Take 2 capsules (600 mg total) by mouth 4 (four) times daily. For pain management/agitation 06/28/15  Yes Encarnacion Slates, NP  HYDROcodone-acetaminophen (NORCO/VICODIN) 5-325 MG tablet Take 1 tablet (5-325) mg every 8 hours (Must last 28 days): For pain 06/28/15  Yes Encarnacion Slates, NP  lidocaine (LIDODERM) 5 % Place 1 patch onto the skin daily. Remove & Discard patch within 12 hours or as directed by MD 06/29/15  Yes Encarnacion Slates, NP  lisinopril (PRINIVIL,ZESTRIL) 5 MG tablet  Take 1 tablet (5 mg total) by mouth daily. For high blood pressure 06/28/15  Yes Encarnacion Slates, NP  loratadine (CLARITIN) 10 MG tablet Take 1 tablet (10 mg total) by mouth daily. Foe allergies 06/28/15  Yes Encarnacion Slates, NP  meclizine (ANTIVERT) 25 MG tablet Take one tablet by mouth once daily as needed for dizziness. 06/28/15  Yes Encarnacion Slates, NP  potassium chloride (K-DUR,KLOR-CON) 10 MEQ tablet Take 1  tablet (10 mEq total) by mouth daily. For low potassium 06/28/15  Yes Encarnacion Slates, NP  pyridOXINE (VITAMIN B-6) 50 MG tablet Take 1 tablet (50 mg total) by mouth daily. For Vitamin B-6 supplement 06/28/15  Yes Encarnacion Slates, NP  sodium chloride (OCEAN) 0.65 % SOLN nasal spray Place 1 spray into both nostrils as needed for congestion. 06/28/15  Yes Encarnacion Slates, NP  SUMAtriptan (IMITREX) 50 MG tablet Take 50 mg by mouth as needed for migraine or headache 06/28/15  Yes Encarnacion Slates, NP  traZODone (DESYREL) 50 MG tablet Take 1 tablet (50 mg total) by mouth at bedtime as needed for sleep. 06/28/15  Yes Encarnacion Slates, NP   BP 123/91 mmHg  Pulse 95  Temp(Src) 98 F (36.7 C) (Oral)  Resp 16  Ht 5\' 5"  (1.651 m)  Wt 196 lb (88.905 kg)  BMI 32.62 kg/m2  SpO2 100% Physical Exam  Constitutional:  Chronically ill, disheveled   HENT:  Head: Normocephalic.  Mouth/Throat: Oropharynx is clear and moist.  Eyes: Conjunctivae are normal. Pupils are equal, round, and reactive to light.  Neck: Normal range of motion. Neck supple.  Cardiovascular: Normal rate, regular rhythm and normal heart sounds.   Pulmonary/Chest: Effort normal and breath sounds normal. No respiratory distress. She has no wheezes. She has no rales.  Abdominal: Bowel sounds are normal. She exhibits no distension. There is no tenderness. There is no rebound.  Musculoskeletal: Normal range of motion.  Neurological: [name] alert.  A & O x 2. No obvious facial droop, nl strength throughout. No obvious pronator drift.   Skin: Skin is warm and  dry.  Psychiatric:  Unable   Nursing note and vitals reviewed.   ED Course  Procedures (including critical care time) Labs Review Labs Reviewed  CBC WITH DIFFERENTIAL/PLATELET - Abnormal; Notable for the following:    RBC 3.85 (*)    All other components within normal limits  COMPREHENSIVE METABOLIC PANEL - Abnormal; Notable for the following:    Potassium 5.4 (*)    Creatinine, Ser 1.18 (*)    Total Protein 6.1 (*)    Albumin 3.1 (*)    AST 53 (*)    ALT 11 (*)    Total Bilirubin 1.4 (*)    GFR calc non Af Amer 45 (*)    GFR calc Af Amer 52 (*)    All other components within normal limits  ACETAMINOPHEN LEVEL - Abnormal; Notable for the following:    Acetaminophen (Tylenol), Serum <10 (*)    All other components within normal limits  URINALYSIS, ROUTINE W REFLEX MICROSCOPIC (NOT AT [facility]) - Abnormal; Notable for the following:    APPearance CLOUDY (*)    All other components within normal limits  ETHANOL  SALICYLATE LEVEL  URINE RAPID DRUG SCREEN, HOSP PERFORMED  I-STAT TROPOININ, ED    Imaging Review Dg Chest 2 View  07/01/2015  CLINICAL DATA:  Altered mental status for 1 week. Initial encounter. EXAM: CHEST  2 VIEW COMPARISON:  PA and lateral chest 10/11/2011.  CT chest 09/17/2014. FINDINGS: Elevation of the right hemidiaphragm is unchanged. Lungs are clear. There is cardiomegaly. No pneumothorax or pleural effusion. IMPRESSION: Cardiomegaly without acute disease. Electronically Signed   By: Inge Rise M.D.   On: 07/01/2015 18:19   Ct Head Wo Contrast  07/01/2015  CLINICAL DATA:  Increasing falls, hallucinations, and altered mental status. EXAM: CT HEAD WITHOUT CONTRAST CT CERVICAL SPINE WITHOUT CONTRAST TECHNIQUE: Multidetector CT imaging  of the head and cervical spine was performed following the standard protocol without intravenous contrast. Multiplanar CT image reconstructions of the cervical spine were also generated. COMPARISON:  11/05/2014 FINDINGS: CT HEAD  FINDINGS Mild cerebral atrophy. No ventricular dilatation. Patchy low-attenuation changes in the deep white matter consistent with small vessel ischemia. No mass effect or midline shift. No abnormal extra-axial fluid collections. Gray-white matter junctions are distinct. Basal cisterns are not effaced. No evidence of acute intracranial hemorrhage. No depressed skull fractures. Mild mucosal thickening in the paranasal sinuses. No acute air-fluid levels. Mastoid air cells are not opacified. Vascular calcifications. CT CERVICAL SPINE FINDINGS Postoperative changes with anterior plate and screw fixation and intervertebral fusion from C5 through C7. There is discontinuity of the fixation plate between C5 and C6 but this appearance is unchanged since previous study. Bony fusion appears intact. Normal alignment of the cervical spine. No vertebral compression deformities. No prevertebral soft tissue swelling. C1-2 articulation appears intact. Degenerative changes throughout the facet joints. No focal bone lesion or bone destruction. The upper esophagus is mildly distended and contains gas and presumably ingested material. Dysmotility or distal esophageal stricture not excluded. IMPRESSION: No acute intracranial abnormalities. Anterior plate and screw fixation with intervertebral fusion from C5 through C7. Fracture of the hardware plate from [id number], unchanged since previous studies. Degenerative changes in the cervical spine. No acute displaced fractures identified. Incidental note of mild dilatation and residual material in the upper esophagus. Esophageal dysmotility or distal stricture not excluded. Electronically Signed   By: Lucienne Capers M.D.   On: 07/01/2015 18:46   Ct Cervical Spine Wo Contrast  07/01/2015  CLINICAL DATA:  Increasing falls, hallucinations, and altered mental status. EXAM: CT HEAD WITHOUT CONTRAST CT CERVICAL SPINE WITHOUT CONTRAST TECHNIQUE: Multidetector CT imaging of the head and cervical spine  was performed following the standard protocol without intravenous contrast. Multiplanar CT image reconstructions of the cervical spine were also generated. COMPARISON:  11/05/2014 FINDINGS: CT HEAD FINDINGS Mild cerebral atrophy. No ventricular dilatation. Patchy low-attenuation changes in the deep white matter consistent with small vessel ischemia. No mass effect or midline shift. No abnormal extra-axial fluid collections. Gray-white matter junctions are distinct. Basal cisterns are not effaced. No evidence of acute intracranial hemorrhage. No depressed skull fractures. Mild mucosal thickening in the paranasal sinuses. No acute air-fluid levels. Mastoid air cells are not opacified. Vascular calcifications. CT CERVICAL SPINE FINDINGS Postoperative changes with anterior plate and screw fixation and intervertebral fusion from C5 through C7. There is discontinuity of the fixation plate between C5 and C6 but this appearance is unchanged since previous study. Bony fusion appears intact. Normal alignment of the cervical spine. No vertebral compression deformities. No prevertebral soft tissue swelling. C1-2 articulation appears intact. Degenerative changes throughout the facet joints. No focal bone lesion or bone destruction. The upper esophagus is mildly distended and contains gas and presumably ingested material. Dysmotility or distal esophageal stricture not excluded. IMPRESSION: No acute intracranial abnormalities. Anterior plate and screw fixation with intervertebral fusion from C5 through C7. Fracture of the hardware plate from [id number], unchanged since previous studies. Degenerative changes in the cervical spine. No acute displaced fractures identified. Incidental note of mild dilatation and residual material in the upper esophagus. Esophageal dysmotility or distal stricture not excluded. Electronically Signed   By: Lucienne Capers M.D.   On: 07/01/2015 18:46   I have personally reviewed and evaluated these images  and lab results as part of my medical decision-making.   EKG Interpretation   Date/Time:  Friday July 01 2015 17:27:51 EST Ventricular Rate:  99 PR Interval:  171 QRS Duration: 87 QT Interval:  351 QTC Calculation: 450 R Axis:   48 Text Interpretation:  Sinus rhythm Anteroseptal infarct, age indeterminate  No significant change since last tracing Confirmed by YAO  MD, DAVID  ([id number]) on 07/01/2015 5:35:32 PM      MDM   Final diagnoses:  None    [name] is a 75 y.o. female here with worsening hallucinations, weakness, fall. Consider stroke vs UTI vs pneumonia vs electrolyte abnormalities vs worsening dementia. Will get CT head, labs, UA, CXR.   10:46 PM Patient delirious and confused. Has no idea where she is and still having visual hallucinations. Still can't want. CT head unremarkable. Labs and UA and CXR unremarkable. Unable to medically clear. Will admit for delirium, weakness.    Wandra Arthurs, MD 07/01/15 [phone]

## 2015-07-02 ENCOUNTER — Inpatient Hospital Stay (HOSPITAL_COMMUNITY): Payer: Medicare Other

## 2015-07-02 DIAGNOSIS — G9340 Encephalopathy, unspecified: Secondary | ICD-10-CM

## 2015-07-02 LAB — COMPREHENSIVE METABOLIC PANEL
ALBUMIN: 2.6 g/dL — AB (ref 3.5–5.0)
ALK PHOS: 99 U/L (ref 38–126)
ALT: 10 U/L — AB (ref 14–54)
ANION GAP: 10 (ref 5–15)
AST: 21 U/L (ref 15–41)
BUN: 14 mg/dL (ref 6–20)
CALCIUM: 8.9 mg/dL (ref 8.9–10.3)
CHLORIDE: 106 mmol/L (ref 101–111)
CO2: 25 mmol/L (ref 22–32)
CREATININE: 1.16 mg/dL — AB (ref 0.44–1.00)
GFR calc non Af Amer: 46 mL/min — ABNORMAL LOW (ref >60)
GFR, EST AFRICAN AMERICAN: 53 mL/min — AB (ref >60)
GLUCOSE: 93 mg/dL (ref 65–99)
Potassium: 3.6 mmol/L (ref 3.5–5.1)
Sodium: 141 mmol/L (ref 135–145)
Total Bilirubin: 0.3 mg/dL (ref 0.3–1.2)
Total Protein: 5.4 g/dL — ABNORMAL LOW (ref 6.5–8.1)

## 2015-07-02 LAB — LIPID PANEL
CHOL/HDL RATIO: 3.2 ratio
CHOLESTEROL: 117 mg/dL (ref 0–200)
HDL: 37 mg/dL — ABNORMAL LOW (ref >40)
LDL CALC: 50 mg/dL (ref 0–99)
Triglycerides: 152 mg/dL — ABNORMAL HIGH (ref <150)
VLDL: 30 mg/dL (ref 0–40)

## 2015-07-02 LAB — CBC
HCT: 33.9 % — ABNORMAL LOW (ref 36.0–46.0)
HEMOGLOBIN: 10.9 g/dL — AB (ref 12.0–15.0)
MCH: 31.1 pg (ref 26.0–34.0)
MCHC: 32.2 g/dL (ref 30.0–36.0)
MCV: 96.6 fL (ref 78.0–100.0)
Platelets: 253 10*3/uL (ref 150–400)
RBC: 3.51 MIL/uL — AB (ref 3.87–5.11)
RDW: 14.3 % (ref 11.5–15.5)
WBC: 7.7 10*3/uL (ref 4.0–10.5)

## 2015-07-02 LAB — CREATININE, URINE, RANDOM: Creatinine, Urine: 59.97 mg/dL

## 2015-07-02 LAB — PROTIME-INR
INR: 1.13 (ref 0.00–1.49)
Prothrombin Time: 14.7 seconds (ref 11.6–15.2)

## 2015-07-02 LAB — SODIUM, URINE, RANDOM: Sodium, Ur: 80 mmol/L

## 2015-07-02 MED ORDER — KETOROLAC TROMETHAMINE 30 MG/ML IJ SOLN
30.0000 mg | Freq: Once | INTRAMUSCULAR | Status: AC
  Administered 2015-07-02: 30 mg via INTRAVENOUS
  Filled 2015-07-02: qty 1

## 2015-07-02 MED ORDER — GABAPENTIN 300 MG PO CAPS
300.0000 mg | ORAL_CAPSULE | Freq: Two times a day (BID) | ORAL | Status: DC
  Administered 2015-07-02: 300 mg via ORAL
  Filled 2015-07-02: qty 1

## 2015-07-02 MED ORDER — SODIUM CHLORIDE 0.9 % IV SOLN
INTRAVENOUS | Status: AC
  Administered 2015-07-02: 12:00:00 via INTRAVENOUS

## 2015-07-02 NOTE — Progress Notes (Signed)
Pt requested to restart her neurotin and wanted a stronger pain medication. Informed Dr. Candiss Norse, who gave orders to start neurontin BID and give Toradol x1 IV.

## 2015-07-02 NOTE — Progress Notes (Signed)
Pt requested to restart her neurotin and norco.  Informed Dr. Candiss Norse, who requested that family be contacted since the medication will likely make the patient drowsy.  Called and left a message for Butch and Francene Boyers.  Will continue to monitor.

## 2015-07-02 NOTE — Progress Notes (Signed)
Spoke with [name] in regards to his mother's request for pain medication and stated "After talking to my brother, I agree that we can restart her on her neurotin, but we do not want to her restart on the vicodin.  We feel that has been a problem for her and that she has overdosed on it before.  She was just recently seen at [facility] for taking too much medicine.  When she was there, they gave her tramadol.  We are also wondering if she is being checked for dementia because she is having a hard time remembering dates and we think she may be forgetting that she has already taken her medicine and takes it again."  Will notify Dr. Candiss Norse of family's request.  Will continue to monitor.

## 2015-07-02 NOTE — Evaluation (Signed)
Physical Therapy Evaluation Patient Details Name: [name] MRN: [id number] DOB: Mar 04, 1941 Today's Date: 07/02/2015   History of Present Illness  Pt adm with altered mental status, falls, and hallucinations. PMH - lumbar laminectomy, bil TKA, ankle fx, depression, frequent falls  Clinical Impression  Pt admitted with above diagnosis and presents to PT with functional limitations due to deficits listed below (See PT problem list). Pt needs skilled PT to maximize independence and safety to allow discharge to SNF. Pt lethargic and required max verbal/tactile stimulation to arouse. Minimally participative due to lethargy. Pt with long history of falls at home.     Follow Up Recommendations SNF    Equipment Recommendations  None recommended by PT    Recommendations for Other Services       Precautions / Restrictions Precautions Precautions: Fall Restrictions Weight Bearing Restrictions: No      Mobility  Bed Mobility Overal bed mobility: Needs Assistance Bed Mobility: Supine to Sit;Sit to Supine     Supine to sit: +2 for physical assistance;Total assist Sit to supine: +2 for physical assistance;Mod assist   General bed mobility comments: Assist for all aspects due to lethargy  Transfers Overall transfer level: Needs assistance Equipment used: 2 person hand held assist Transfers: Sit to/from Stand Sit to Stand: +2 physical assistance;Mod assist         General transfer comment: Partially stood with flexed posture for ~5 seconds  Ambulation/Gait                Stairs            Wheelchair Mobility    Modified Rankin (Stroke Patients Only)       Balance Overall balance assessment: Needs assistance Sitting-balance support: Bilateral upper extremity supported;Feet supported Sitting balance-Leahy Scale: Poor Sitting balance - Comments: Required min to mod A to sit EOB x 4 minutes.   Standing balance support: Bilateral upper extremity  supported Standing balance-Leahy Scale: Poor Standing balance comment: 2 person hand held for standing x 5 seconds.                             Pertinent Vitals/Pain      Home Living Family/patient expects to be discharged to:: Private residence Living Arrangements: Alone Available Help at Discharge: Family;Friend(s) Type of Home: House Home Access: Stairs to enter Entrance Stairs-Rails: None Entrance Stairs-Number of Steps: 1 threshold  Home Layout: One level Home Equipment: Environmental consultant - 2 wheels;Walker - 4 wheels;Shower seat;Shower seat - built in;Wheelchair - manual (tub seat that goes up and down) Additional Comments: lifeline. Information from prior encounter.    Prior Function           Comments: Pt with recent SNF stay and recent [facility] admission.     Hand Dominance   Dominant Hand: Right    Extremity/Trunk Assessment   Upper Extremity Assessment: Defer to OT evaluation           Lower Extremity Assessment: Difficult to assess due to impaired cognition;Generalized weakness         Communication      Cognition Arousal/Alertness: Lethargic Behavior During Therapy: Flat affect Overall Cognitive Status: Difficult to assess                      General Comments      Exercises        Assessment/Plan    PT Assessment Patient needs continued PT services  PT Diagnosis Difficulty walking;Generalized weakness;Altered mental status   PT Problem List Decreased strength;Decreased activity tolerance;Decreased balance;Decreased mobility;Decreased cognition;Obesity  PT Treatment Interventions DME instruction;Gait training;Functional mobility training;Therapeutic activities;Therapeutic exercise;Balance training;Patient/family education;Cognitive remediation   PT Goals (Current goals can be found in the Care Plan section) Acute Rehab PT Goals Patient Stated Goal: Pt didn't state PT Goal Formulation: Patient unable to participate in goal  setting Time For Goal Achievement: 07/16/15 Potential to Achieve Goals: Fair    Frequency Min 3X/week   Barriers to discharge Decreased caregiver support lives alone    Co-evaluation               End of Session   Activity Tolerance: Patient limited by lethargy Patient left: in bed;with call bell/phone within reach;with bed alarm set Nurse Communication: Mobility status         Time: [id number] PT Time Calculation (min) (ACUTE ONLY): 13 min   Charges:   PT Evaluation $PT Eval Moderate Complexity: 1 Procedure     PT G Codes:        Wallis Vancott 07-05-2015, 9:43 AM [facility] PT [phone]

## 2015-07-02 NOTE — Progress Notes (Signed)
unsuccessful attempt to get pt up in chair this morning. Nursing and PT staff attempted but pt is too sleepy at this time. We will retry later during the day.  Ferdinand Lango, RN

## 2015-07-02 NOTE — Telephone Encounter (Signed)
It sounds like she was delirious.  I'm glad she went to some appointment b/c she never comes here to be seen so it's hard for me to help her.  She has missed her past 3 appts due to different kinds of pain, not feeling well so not being able to come in, etc.  I'm glad they sent her to the hospital.  I had spoke with Dr. Geoffry Paradise in the past and I had started [name]'s zoloft after our discussion, and this was then titrated when she was last hospitalized for her depression.  I believe she also may have had back surgery since I saw her last.  She absolutely will need to see me after this hospital stay.

## 2015-07-02 NOTE — Evaluation (Signed)
Speech Language Pathology Evaluation Patient Details Name: [name] MRN: [id number] DOB: May 27, 1941 Today's Date: 07/02/2015 Time: 85-     Problem List:  Patient Active Problem List   Diagnosis Date Noted  . Visual hallucination 07/01/2015  . Delirium   . Severe episode of recurrent major depressive disorder, without psychotic features ([place])   . Back pain 06/20/2015  . MDD (major depressive disorder), recurrent episode, severe ([place]) 06/16/2015  . Lumbar stenosis 04/19/2015  . Hypokalemia 09/19/2014  . Constipation 09/19/2014  . Acute encephalopathy 09/18/2014  . Acute respiratory failure ([place]) 09/17/2014  . OSA (obstructive sleep apnea) 09/17/2014  . Ankle fracture, lateral malleolus, closed 09/14/2014  . Major depressive disorder, recurrent severe without psychotic features ([place]) 08/07/2014  . Insomnia 08/05/2014  . Hip joint painful on movement 08/05/2014  . Degenerative disc disease, lumbar 08/05/2014  . Severe obesity (BMI >= 40) ([place]) 01/11/2014  . Hyperlipemia   . Depression   . Arthritis   . Polyneuropathy in other diseases classified elsewhere ([place]) 04/08/2013  . Restless legs syndrome (RLS) 04/08/2013  . UTI (lower urinary tract infection) 10/14/2011  . Dizziness 10/12/2011  . Fall 10/12/2011  . TIA (transient ischemic attack) 10/12/2011  . Cellulitis 10/12/2011  . HTN (hypertension) 10/12/2011  . GERD (gastroesophageal reflux disease) 10/12/2011   Past Medical History:  Past Medical History  Diagnosis Date  . GERD (gastroesophageal reflux disease)     hx pud '98  . Arthritis     djd  . Restless leg syndrome     takes Sinemet daily  . 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 Dysrhythmia     hx tachy palpitations  . Neuromuscular disorder ([place])     peripheral neuropathy, FEET AND LEGS  . Stroke [facility])     ? 6 months ago - tests okay - [facility]   . Cellulitis     10/11/11 hospitalized for cellulitis   . Hyperlipemia     takes Atorvastatin  daily  . Peripheral neuropathy ([place])   . Elbow fracture, left April 2015  . Depression     takes Zoloft daily  . Vertigo     takes Meclizine daily as needed  . Insomnia     takes restoril nightly as needed  . Hypertension     takes HCTZ daily  . Peripheral neuropathy ([facility])     takes Gabapentin daily  . History of bronchitis     many yrs ago  . Headache(784.0)     migraines yrs ago-takes Imitrex daily  . Joint pain   . Joint swelling   . Chronic back pain     scoliosis/stenosis  . Anxiety     takes Ativan daily as needed  . Cataracts, bilateral     immature   . Depression    Past Surgical History:  Past Surgical History  Procedure Laterality Date  . Cervical disc surgery x2  2011  . Rt carotid enarterectomy  2011  . Rt knee arthroscopy  '99  . Left knee arthroscopy  2001  . Hematoma evacuation  2011    s/p rt cea  . Joint replacement  '01    total knee replacement, LEFT  . Abdominal hysterectomy  1975    bil oophorectomy  . Back surgery   MANY YRS AGO    laminectomy x3  . Cholecystectomy  1993  . Total knee arthroplasty  09/13/2011    Procedure: TOTAL KNEE ARTHROPLASTY;  Surgeon: Jeneen Rinks  P Apling[place], MD;  Location: [facility];  Service: Orthopedics;  Laterality: Right;  . Right knee replacement      09/2011   . Carpal tunnel release  07/09/2012    Procedure: CARPAL TUNNEL RELEASE;  Surgeon: Magnus Sinning, MD;  Location: [facility];  Service: Orthopedics;  Laterality: Left;  . Finger arthroplasty  07/09/2012    Procedure: FINGER ARTHROPLASTY;  Surgeon: Magnus Sinning, MD;  Location: [facility];  Service: Orthopedics;  Laterality: Left;  Interposition Arthroplasty [facility] Joint Thumb Left   . Lipoma excision  07/09/2012    Procedure: EXCISION LIPOMA;  Surgeon: Magnus Sinning, MD;  Location: [facility];  Service: Orthopedics;  Laterality: Left;  Excision Lipoma Dorsum Left Wrist   . Orif ankle fracture Right 09/14/2014    FIBULA   . Orif ankle fracture Right 09/14/2014    Procedure:  OPEN REDUCTION INTERNAL FIXATION (ORIF) RIGHT ANKLE FRACTURE/SYNDESMOSIS ;  Surgeon: Wylene Simmer, MD;  Location: [facility];  Service: Orthopedics;  Laterality: Right;  . Colonosocpy    . Esophagogastroduodenoscopy    . Lumbar laminectomy with coflex 1 level Bilateral 04/19/2015    Procedure: LUMBAR TWO-THREE LUMBAR LAMINECTOMY WITH COFLEX ;  Surgeon: Kristeen Miss, MD;  Location: [facility];  Service: Neurosurgery;  Laterality: Bilateral;  Bilateral L23 laminectomy and foraminotomy with coflex   HPI:  Pt is a 75 y.o. female with PMH of TIA, hypertension, hyperlipidemia, depression, anxiety, stroke, vertigo, chronic back pain, peripheral neuropathy, OSA, obesity, TIA, who presents with altered mental status, fall and visual hallucination. CT and MRI of head negative for acute abnormalities.    Assessment / Plan / Recommendation Clinical Impression  Pt presents with mild to moderate cognitive deficits though difficult to assess if worse than baseline functioning. Note no acute neurological changes on CT or MRI of head. Cognitive deficits characterized by reduced insight to deficits and decreased reasoning skills, states she does not know why she was hospitalized despite admitting to multiple falls at home. Pt states her PLOF is independent with all ADLs including medicine management and driving though question accuracy and safety of task completion. Recommend ST cognitive intervention  at next level of care to maximize independence and safety.     SLP Assessment  All further Speech Lanaguage Pathology  needs can be addressed in the next venue of care    Follow Up Recommendations  Skilled Nursing facility    Frequency and Duration           SLP Evaluation Prior Functioning  Cognitive/Linguistic Baseline: Information not available Type of Home: House  Lives With: Alone Available Help at Discharge: Family Education: some college Vocation: Retired   Associate Professor  Overall Cognitive Status:  Difficult to assess Arousal/Alertness: Awake/alert Orientation Level: Oriented to person;Oriented to place;Disoriented to time;Disoriented to situation Memory: Impaired Awareness: Impaired Problem Solving: Impaired Safety/Judgment: Impaired    Comprehension  Auditory Comprehension Overall Auditory Comprehension: Appears within functional limits for tasks assessed    Expression Verbal Expression Overall Verbal Expression: Appears within functional limits for tasks assessed Written Expression Dominant Hand: Right   Oral / Motor  Oral Motor/Sensory Function Overall Oral Motor/Sensory Function: Within functional limits Motor Speech Overall Motor Speech: Appears within functional limits for tasks assessed   GO                   Arvil Chaco MA, [place] Language Pathologist    Levi Aland 07/02/2015, 2:10 PM

## 2015-07-02 NOTE — Progress Notes (Signed)
Pt refusing to get out of bed after multiples attempt to convince her that she would feel better by getting out of bed. She said " I am too tired , i want to stay in bed"  Ferdinand Lango, RN

## 2015-07-02 NOTE — Progress Notes (Signed)
Patient Demographics:    [name], is a 75 y.o. female, DOB - 09/08/1940, [id number]  Admit date - 07/01/2015   Admitting Physician Ivor Costa, MD  Outpatient Primary MD for the patient is [name], [name], DO  LOS - 1   Chief Complaint  Patient presents with  . Altered Mental Status  . Hallucinations        Subjective:    [name] today has, No headache, No chest pain, No abdominal pain - No Nausea, No new weakness tingling or numbness, No Cough - SOB.     Assessment  & Plan :     1. Toxic/metabolic encephalopathy. Most likely due to combination of mild dehydration and sedative medications, she is much improved this morning after some hydration and holding of offending medications which included hydrocodone, Neurontin, meclizine, Claritin, trazodone. Head CT and MRI brain nonacute, no focal deficits, no headache or neck stiffness. [name] close to her baseline. We will commence PT and increase activity. Likely discharge in the morning if symptom-free.  2. 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 No focal deficits, MRI brain nonacute, continue aspirin and statin for secondary prevention.  3. Dyslipidemia. Home dose statin continued.  4. Chronic back pain and peripheral neuropathy. Minimize narcotics, hold Neurontin, Tylenol if needed. PS2 be in no distress whatsoever.  5. History of depression. Continue Cymbalta.    Code Status : Full  Family Communication  : None present  Disposition Plan  : Home in 1-2 days  Consults  :  None  Procedures  :   CT and MRI Head - nonacute without any stroke, note CT of the C-spine confirms an old C5-C6 hardware fracture.  DVT Prophylaxis  :  Lovenox    Lab Results  Component Value Date   PLT 253 07/02/2015    Inpatient Medications  Scheduled  Meds: . aspirin  325 mg Oral Daily  . atorvastatin  40 mg Oral q1800  . cholecalciferol  1,000 Units Oral Daily  . docusate sodium  100 mg Oral Daily  . DULoxetine  60 mg Oral Daily  . enoxaparin (LOVENOX) injection  40 mg Subcutaneous Q24H  . pyridOXINE  50 mg Oral Daily   Continuous Infusions: . sodium chloride     PRN Meds:.acetaminophen **OR** [DISCONTINUED] acetaminophen, hydrALAZINE, sodium chloride  Antibiotics  :     Anti-infectives    None        Objective:   Filed Vitals:   07/01/15 2328 07/02/15 0000 07/02/15 0037 07/02/15 0602  BP: 157/67 140/60 136/76 129/48  Pulse: 106 105 103 103  Temp:   98.2 F (36.8 C)   TempSrc:   Oral   Resp: 18 28 18 19   Height:   5\' 5"  (1.651 m)   Weight:   92.488 kg (203 lb 14.4 oz)   SpO2: 100% 100% 100% 99%    Wt Readings from Last 3 Encounters:  07/02/15 92.488 kg (203 lb 14.4 oz)  06/16/15 88.905 kg (196 lb)  04/19/15 92.08 kg (203 lb)     Intake/Output Summary (Last 24 hours) at 07/02/15 1016 Last data filed at 07/02/15 0944  Gross per 24 hour  Intake    120 ml  Output    350 ml  Net   -  230 ml     Physical Exam  Awake Alert, Oriented X 3, No new F.N deficits, Normal affect .AT,PERRAL Supple Neck,No JVD, No cervical lymphadenopathy appriciated.  Symmetrical Chest wall movement, Good air movement bilaterally, CTAB RRR,No Gallops,Rubs or new Murmurs, No Parasternal Heave +ve B.Sounds, Abd Soft, No tenderness, No organomegaly appriciated, No rebound - guarding or rigidity. No Cyanosis, Clubbing or edema, No new Rash or bruise       Data Review:   Micro Results No results found for this or any previous visit (from the past 240 hour(s)).  Radiology Reports Dg Chest 2 View  07/01/2015  CLINICAL DATA:  Altered mental status for 1 week. Initial encounter. EXAM: CHEST  2 VIEW COMPARISON:  PA and lateral chest 10/11/2011.  CT chest 09/17/2014. FINDINGS: Elevation of the right hemidiaphragm is unchanged. Lungs  are clear. There is cardiomegaly. No pneumothorax or pleural effusion. IMPRESSION: Cardiomegaly without acute disease. Electronically Signed   By: Inge Rise M.D.   On: 07/01/2015 18:19   Ct Head Wo Contrast  07/01/2015  CLINICAL DATA:  Increasing falls, hallucinations, and altered mental status. EXAM: CT HEAD WITHOUT CONTRAST CT CERVICAL SPINE WITHOUT CONTRAST TECHNIQUE: Multidetector CT imaging of the head and cervical spine was performed following the standard protocol without intravenous contrast. Multiplanar CT image reconstructions of the cervical spine were also generated. COMPARISON:  11/05/2014 FINDINGS: CT HEAD FINDINGS Mild cerebral atrophy. No ventricular dilatation. Patchy low-attenuation changes in the deep white matter consistent with small vessel ischemia. No mass effect or midline shift. No abnormal extra-axial fluid collections. Gray-white matter junctions are distinct. Basal cisterns are not effaced. No evidence of acute intracranial hemorrhage. No depressed skull fractures. Mild mucosal thickening in the paranasal sinuses. No acute air-fluid levels. Mastoid air cells are not opacified. Vascular calcifications. CT CERVICAL SPINE FINDINGS Postoperative changes with anterior plate and screw fixation and intervertebral fusion from C5 through C7. There is discontinuity of the fixation plate between C5 and C6 but this appearance is unchanged since previous study. Bony fusion appears intact. Normal alignment of the cervical spine. No vertebral compression deformities. No prevertebral soft tissue swelling. C1-2 articulation appears intact. Degenerative changes throughout the facet joints. No focal bone lesion or bone destruction. The upper esophagus is mildly distended and contains gas and presumably ingested material. Dysmotility or distal esophageal stricture not excluded. IMPRESSION: No acute intracranial abnormalities. Anterior plate and screw fixation with intervertebral fusion from C5  through C7. Fracture of the hardware plate from [id number], unchanged since previous studies. Degenerative changes in the cervical spine. No acute displaced fractures identified. Incidental note of mild dilatation and residual material in the upper esophagus. Esophageal dysmotility or distal stricture not excluded. Electronically Signed   By: Lucienne Capers M.D.   On: 07/01/2015 18:46   Ct Cervical Spine Wo Contrast  07/01/2015  CLINICAL DATA:  Increasing falls, hallucinations, and altered mental status. EXAM: CT HEAD WITHOUT CONTRAST CT CERVICAL SPINE WITHOUT CONTRAST TECHNIQUE: Multidetector CT imaging of the head and cervical spine was performed following the standard protocol without intravenous contrast. Multiplanar CT image reconstructions of the cervical spine were also generated. COMPARISON:  11/05/2014 FINDINGS: CT HEAD FINDINGS Mild cerebral atrophy. No ventricular dilatation. Patchy low-attenuation changes in the deep white matter consistent with small vessel ischemia. No mass effect or midline shift. No abnormal extra-axial fluid collections. Gray-white matter junctions are distinct. Basal cisterns are not effaced. No evidence of acute intracranial hemorrhage. No depressed skull fractures. Mild mucosal thickening in the paranasal sinuses. No  acute air-fluid levels. Mastoid air cells are not opacified. Vascular calcifications. CT CERVICAL SPINE FINDINGS Postoperative changes with anterior plate and screw fixation and intervertebral fusion from C5 through C7. There is discontinuity of the fixation plate between C5 and C6 but this appearance is unchanged since previous study. Bony fusion appears intact. Normal alignment of the cervical spine. No vertebral compression deformities. No prevertebral soft tissue swelling. C1-2 articulation appears intact. Degenerative changes throughout the facet joints. No focal bone lesion or bone destruction. The upper esophagus is mildly distended and contains gas and  presumably ingested material. Dysmotility or distal esophageal stricture not excluded. IMPRESSION: No acute intracranial abnormalities. Anterior plate and screw fixation with intervertebral fusion from C5 through C7. Fracture of the hardware plate from [id number], unchanged since previous studies. Degenerative changes in the cervical spine. No acute displaced fractures identified. Incidental note of mild dilatation and residual material in the upper esophagus. Esophageal dysmotility or distal stricture not excluded. Electronically Signed   By: Lucienne Capers M.D.   On: 07/01/2015 18:46   Mr Brain Wo Contrast  07/02/2015  CLINICAL DATA:  Initial evaluation for acute encephalopathy. EXAM: MRI HEAD WITHOUT CONTRAST TECHNIQUE: Multiplanar, multiecho pulse sequences of the brain and surrounding structures were obtained without intravenous contrast. COMPARISON:  Prior CT from 07/01/2015. FINDINGS: Mild diffuse prominence of the CSF containing spaces is compatible with generalized age-related cerebral atrophy. Scattered patchy T2/FLAIR hyperintensity within the periventricular and deep white matter most consistent with chronic small vessel ischemic disease, mild for age. Minimal small vessel type changes present within the pons. No abnormal foci of restricted diffusion to suggest acute intracranial infarct. Gray-white matter differentiation maintained. Normal intravascular flow voids preserved. No acute or chronic intracranial hemorrhage. No areas of chronic infarction. No mass lesion, midline shift, or mass effect. No hydrocephalus. No extra-axial fluid collection. Craniocervical junction within normal limits. Visualized upper cervical spine unremarkable. Pituitary gland normal.  No acute abnormality about the orbits. Mild mucosal thickening within the paranasal sinuses. No air-fluid levels to suggest active sinus infection. Trace opacity within the mastoid air cells. Inner ear structures grossly normal. Bone marrow  signal intensity within normal limits. No scalp soft tissue abnormality. IMPRESSION: 1. No acute intracranial infarct or other process identified. 2. Mild age-related cerebral atrophy with chronic small vessel ischemic disease. Electronically Signed   By: Jeannine Boga M.D.   On: 07/02/2015 02:22     CBC  Recent Labs Lab 07/01/15 2111 07/02/15 0544  WBC 10.4 7.7  HGB 12.3 10.9*  HCT 36.6 33.9*  PLT 371 253  MCV 95.1 96.6  MCH 31.9 31.1  MCHC 33.6 32.2  RDW 14.7 14.3  LYMPHSABS 2.6  --   MONOABS 0.6  --   EOSABS 0.2  --   BASOSABS 0.0  --     Chemistries   Recent Labs Lab 07/01/15 2111 07/02/15 0544  NA 136 141  K 5.4* 3.6  CL 101 106  CO2 23 25  GLUCOSE 90 93  BUN 15 14  CREATININE 1.18* 1.16*  CALCIUM 9.1 8.9  AST 53* 21  ALT 11* 10*  ALKPHOS 109 99  BILITOT 1.4* 0.3   ------------------------------------------------------------------------------------------------------------------  Recent Labs  07/02/15 0544  CHOL 117  HDL 37*  LDLCALC 50  TRIG 152*  CHOLHDL 3.2    Lab Results  Component Value Date   HGBA1C 6.1* 06/18/2015   ------------------------------------------------------------------------------------------------------------------ No results for input(s): TSH, T4TOTAL, T3FREE, THYROIDAB in the last 72 hours.  Invalid input(s): FREET3 ------------------------------------------------------------------------------------------------------------------ No results  for input(s): VITAMINB12, FOLATE, FERRITIN, TIBC, IRON, RETICCTPCT in the last 72 hours.  Coagulation profile  Recent Labs Lab 07/02/15 0544  INR 1.13    No results for input(s): DDIMER in the last 72 hours.  Cardiac Enzymes No results for input(s): CKMB, TROPONINI, MYOGLOBIN in the last 168 hours.  Invalid input(s): CK ------------------------------------------------------------------------------------------------------------------ No results found for: BNP  Time  Spent in minutes  35   SINGH,PRASHANT K M.D on 07/02/2015 at 10:16 AM  Between 7am to 7pm - Pager - [phone]  After 7pm go to [email or web address] - password [facility]  [facility] -  Office  [phone]

## 2015-07-03 MED ORDER — TRAMADOL HCL 50 MG PO TABS
50.0000 mg | ORAL_TABLET | Freq: Four times a day (QID) | ORAL | Status: DC | PRN
  Administered 2015-07-03 – 2015-07-04 (×4): 50 mg via ORAL
  Filled 2015-07-03 (×4): qty 1

## 2015-07-03 MED ORDER — HYDRALAZINE HCL 20 MG/ML IJ SOLN
10.0000 mg | Freq: Once | INTRAMUSCULAR | Status: AC
  Administered 2015-07-03: 10 mg via INTRAVENOUS
  Filled 2015-07-03: qty 1

## 2015-07-03 MED ORDER — GABAPENTIN 400 MG PO CAPS
400.0000 mg | ORAL_CAPSULE | Freq: Four times a day (QID) | ORAL | Status: DC
  Administered 2015-07-03 – 2015-07-04 (×6): 400 mg via ORAL
  Filled 2015-07-03 (×7): qty 1

## 2015-07-03 MED ORDER — HYDRALAZINE HCL 20 MG/ML IJ SOLN
5.0000 mg | Freq: Once | INTRAMUSCULAR | Status: AC
Start: 2015-07-03 — End: 2015-07-03
  Administered 2015-07-03: 5 mg via INTRAVENOUS
  Filled 2015-07-03: qty 1

## 2015-07-03 MED ORDER — HYDRALAZINE HCL 20 MG/ML IJ SOLN
20.0000 mg | Freq: Four times a day (QID) | INTRAMUSCULAR | Status: DC | PRN
Start: 2015-07-03 — End: 2015-07-04
  Administered 2015-07-04: 20 mg via INTRAVENOUS
  Filled 2015-07-03: qty 1

## 2015-07-03 MED ORDER — HYDRALAZINE HCL 20 MG/ML IJ SOLN: 10.0000 mg | Freq: Four times a day (QID) | INTRAMUSCULAR | Status: DC | PRN

## 2015-07-03 NOTE — Progress Notes (Signed)
Occupational Therapy Evaluation Patient Details Name: Isabella [name] MRN: [id number] DOB: 1940-09-05 Today's Date: 07/03/2015    History of Present Illness Pt adm with altered mental status, falls, and hallucinations. PMH - lumbar laminectomy, bil TKA, ankle fx, depression, frequent falls   Clinical Impression   Pt admitted with the above diagnoses and presents with below problem list. Pt will benefit from continued acute OT to address the below listed deficits and maximize independence with BADLs. PTA pt was mod I with ADLs. Pt is currently setup to min guard with UB ADLs, functional transfers and mobility, min A for LB ADLs due to difficulty accessing feet. Pt much more alert on OT eval compared to lethargy in PT eval note. Some impulsivity and decreased safety awareness noted. See session details below. Pt reports, "I will not go to a skilled nursing facility." Reports having a son and [name] who can assist at home. OT to continue to follow acutely.      Follow Up Recommendations  Supervision/Assistance - 24 hour;Home health OT    Equipment Recommendations  None recommended by OT    Recommendations for Other Services       Precautions / Restrictions Precautions Precautions: Fall Restrictions Weight Bearing Restrictions: No      Mobility Bed Mobility Overal bed mobility: Needs Assistance Bed Mobility: Supine to Sit;Sit to Supine     Supine to sit: Min guard Sit to supine: Min guard   General bed mobility comments: no physical assist. extra time. use of bed rails.  Transfers Overall transfer level: Needs assistance Equipment used: Rolling walker (2 wheeled) Transfers: Sit to/from Stand Sit to Stand: Min guard         General transfer comment: stood with and without rw. Wide BOS. No LOB. No physical assist.    Balance Overall balance assessment: Needs assistance Sitting-balance support: No upper extremity supported;Feet supported Sitting balance-Leahy  Scale: Good Sitting balance - Comments: sat EOB and on toilet with no assist   Standing balance support: Bilateral upper extremity supported;No upper extremity supported;During functional activity Standing balance-Leahy Scale: Fair Standing balance comment: stood at sink to wash hands. declined rw to walk to bathroom. No LOB. Wide BOS.                            ADL Overall ADL's : Needs assistance/impaired Eating/Feeding: Set up;Sitting   Grooming: Min guard;Standing;Wash/dry hands   Upper Body Bathing: Set up;Sitting   Lower Body Bathing: Minimal assistance;Sit to/from stand   Upper Body Dressing : Set up;Sitting   Lower Body Dressing: Minimal assistance;Sit to/from stand Lower Body Dressing Details (indicate cue type and reason): min A to don slippers Toilet Transfer: Min guard;Ambulation;Comfort height toilet   Toileting- Clothing Manipulation and Hygiene: Set up;Sitting/lateral lean   Tub/ Shower Transfer: Walk-in shower;Min guard;Ambulation;Rolling walker   Functional mobility during ADLs: Min guard;Rolling walker General ADL Comments: Pt much more alert on OT eval. reports she uses a rw at home but noted to inititate walking to bathroom with no rw and ignored therapist instruction to wait until she had donned footwear and had a rw. Close min guard for safety. Rw and footwear brought to pt on toilet and utilied coming back to bed. Discussed strategies and equipment for safety with ADLs at home. Pt reports history of falls at home. Pt reports, "I will not go to a skilled nursing facility." Min A for LB ADLs due to difficulty accessing feet.  Vision     Perception     Praxis      Pertinent Vitals/Pain Pain Assessment: 0-10 Pain Score: 9  Pain Location: RLE  Pain Descriptors / Indicators: Grimacing Pain Intervention(s): Limited activity within patient's tolerance;Monitored during session;Repositioned;Patient requesting pain meds-RN notified     Hand  Dominance Right   Extremity/Trunk Assessment Upper Extremity Assessment Upper Extremity Assessment: Generalized weakness;Overall [facility] for tasks assessed   Lower Extremity Assessment Lower Extremity Assessment: Defer to PT evaluation       Communication Communication Communication: No difficulties   Cognition Arousal/Alertness: Awake/alert Behavior During Therapy: Flat affect;Impulsive Overall Cognitive Status: No family/caregiver present to determine baseline cognitive functioning                     General Comments       Exercises       Shoulder Instructions      Home Living Family/patient expects to be discharged to:: Private residence Living Arrangements: Alone Available Help at Discharge: Family;Neighbor Type of Home: House Home Access: Stairs to enter [employer] of Steps: 1 threshold  Entrance Stairs-Rails: None Home Layout: One level     Bathroom Shower/Tub: Tub/shower unit;Walk-in shower Shower/tub characteristics: Curtain Biochemist, clinical: Handicapped height     Home Equipment: Environmental consultant - 2 wheels;Walker - 4 wheels;Shower seat;Shower seat - built in;Wheelchair - manual;Toilet riser   Additional Comments: lifeline. Information from prior encounter.  Lives With: Alone    Prior Functioning/Environment Level of Independence: Independent with assistive device(s)        Comments: Pt with recent SNF stay and recent [facility] admission. Pt reports having a son and [name] who can assist at d/c. Discussed lining up assist/supervision for first few days at d/c.    OT Diagnosis: Generalized weakness;Acute pain;Altered mental status   OT Problem List: Decreased strength;Decreased activity tolerance;Impaired balance (sitting and/or standing);Decreased cognition;Decreased knowledge of use of DME or AE;Decreased knowledge of precautions;Pain   OT Treatment/Interventions: Self-care/ADL training;Therapeutic exercise;Energy conservation;DME and/or AE  instruction;Therapeutic activities;Patient/family education;Balance training    OT Goals(Current goals can be found in the care plan section) Acute Rehab OT Goals Patient Stated Goal: Pt didn't state OT Goal Formulation: With patient Time For Goal Achievement: 07/10/15 Potential to Achieve Goals: Good ADL Goals Pt Will Perform Grooming: with modified independence;standing;sitting Pt Will Perform Upper Body Bathing: with modified independence;sitting Pt Will Perform Lower Body Bathing: with modified independence;sit to/from stand;with adaptive equipment Pt Will Perform Upper Body Dressing: with modified independence;sitting Pt Will Perform Lower Body Dressing: with modified independence;with adaptive equipment;sit to/from stand Pt Will Transfer to Toilet: with modified independence;ambulating;bedside commode Pt Will Perform Tub/Shower Transfer: Shower transfer;with modified independence;ambulating;shower seat;rolling walker  OT Frequency: Min 2X/week   Barriers to D/C: Decreased caregiver support  doesn't sound like 24/7 assist available, only intermittent.        Co-evaluation              End of Session Equipment Utilized During Treatment: Gait belt;Rolling walker  Activity Tolerance: Patient tolerated treatment well;Patient limited by pain Patient left: in bed;with call bell/phone within reach   Time: 0915-0930 OT Time Calculation (min): 15 min Charges:  OT General Charges $OT Visit: 1 Procedure OT Evaluation $OT Eval Low Complexity: 1 Procedure G-Codes:    Hortencia Pilar Jul 09, 2015, 9:54 AM

## 2015-07-03 NOTE — Progress Notes (Signed)
Patient Demographics:    [name], is a 75 y.o. female, DOB - 1941/02/28, [id number]  Admit date - 07/01/2015   Admitting Physician Ivor Costa, MD  Outpatient Primary MD for the patient is [name], [name], DO  LOS - 2   Chief Complaint  Patient presents with  . Altered Mental Status  . Hallucinations        Subjective:    Marai Maros today has, No headache, No chest pain, No abdominal pain - No Nausea, No new weakness tingling or numbness, No Cough - SOB.     Assessment  & Plan :     1. Toxic/metabolic encephalopathy. Most likely due to combination of mild dehydration and sedative medications, she is much improved and back to her baseline after hydration and holding of offending medications which included hydrocodone, Neurontin, meclizine, Claritin, trazodone. Head CT and MRI brain nonacute, no focal deficits, no headache or neck stiffness. [name] close to her baseline. ED following will all of 5 for SNF. Likely discharge in the morning if symptom-free. Neurontin has been resumed will monitor. [name] asking for narcotics but I am holding them off for now. Family also counseled yesterday.  2. 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 No focal deficits, MRI brain nonacute, continue aspirin and statin for secondary prevention.  3. Dyslipidemia. Home dose statin continued.  4. Chronic back pain and peripheral neuropathy. Minimize narcotics, resume Neurontin, Tylenol if needed. Patient does not seem to be in any distress whatsoever.  5. History of depression. Continue Cymbalta.    Code Status : Full  Family Communication  : None present  Disposition Plan  : SNF tomorrow  Consults  :  None  Procedures  :   CT and MRI Head - nonacute without any stroke, note CT of the C-spine confirms an old  C5-C6 hardware fracture.  DVT Prophylaxis  :  Lovenox    Lab Results  Component Value Date   PLT 253 07/02/2015    Inpatient Medications  Scheduled Meds: . aspirin  325 mg Oral Daily  . atorvastatin  40 mg Oral q1800  . cholecalciferol  1,000 Units Oral Daily  . docusate sodium  100 mg Oral Daily  . DULoxetine  60 mg Oral Daily  . enoxaparin (LOVENOX) injection  40 mg Subcutaneous Q24H  . gabapentin  400 mg Oral Q6H  . pyridOXINE  50 mg Oral Daily   Continuous Infusions:   PRN Meds:.acetaminophen **OR** [DISCONTINUED] acetaminophen, hydrALAZINE, sodium chloride, traMADol  Antibiotics  :     Anti-infectives    None        Objective:   Filed Vitals:   07/02/15 1819 07/02/15 2034 07/03/15 0005 07/03/15 0414  BP: 135/75 158/76 149/77 149/70  Pulse: 103 99 104 101  Temp: 98.7 F (37.1 C) 98.3 F (36.8 C) 98.2 F (36.8 C) 98.2 F (36.8 C)  TempSrc: Oral Oral Oral Oral  Resp: 19 18 18 18   Height:      Weight:      SpO2: 100% 97% 99% 98%    Wt Readings from Last 3 Encounters:  07/02/15 92.488 kg (203 lb 14.4 oz)  06/16/15 88.905 kg (196 lb)  04/19/15 92.08 kg (203 lb)     Intake/Output Summary (  Last 24 hours) at 07/03/15 0932 Last data filed at 07/03/15 [id number]  Gross per 24 hour  Intake   1004 ml  Output   1400 ml  Net   -396 ml     Physical Exam  Awake Alert, Oriented X 3, No new F.N deficits, Normal affect [place].AT,PERRAL Supple Neck,No JVD, No cervical lymphadenopathy appriciated.  Symmetrical Chest wall movement, Good air movement bilaterally, CTAB RRR,No Gallops,Rubs or new Murmurs, No Parasternal Heave +ve B.Sounds, Abd Soft, No tenderness, No organomegaly appriciated, No rebound - guarding or rigidity. No Cyanosis, Clubbing or edema, No new Rash or bruise       Data Review:   Micro Results No results found for this or any previous visit (from the past 240 hour(s)).  Radiology Reports Dg Chest 2 View  07/01/2015  CLINICAL DATA:  Altered  mental status for 1 week. Initial encounter. EXAM: CHEST  2 VIEW COMPARISON:  PA and lateral chest 10/11/2011.  CT chest 09/17/2014. FINDINGS: Elevation of the right hemidiaphragm is unchanged. Lungs are clear. There is cardiomegaly. No pneumothorax or pleural effusion. IMPRESSION: Cardiomegaly without acute disease. Electronically Signed   By: Inge Rise M.D.   On: 07/01/2015 18:19   Ct Head Wo Contrast  07/01/2015  CLINICAL DATA:  Increasing falls, hallucinations, and altered mental status. EXAM: CT HEAD WITHOUT CONTRAST CT CERVICAL SPINE WITHOUT CONTRAST TECHNIQUE: Multidetector CT imaging of the head and cervical spine was performed following the standard protocol without intravenous contrast. Multiplanar CT image reconstructions of the cervical spine were also generated. COMPARISON:  11/05/2014 FINDINGS: CT HEAD FINDINGS Mild cerebral atrophy. No ventricular dilatation. Patchy low-attenuation changes in the deep white matter consistent with small vessel ischemia. No mass effect or midline shift. No abnormal extra-axial fluid collections. Gray-white matter junctions are distinct. Basal cisterns are not effaced. No evidence of acute intracranial hemorrhage. No depressed skull fractures. Mild mucosal thickening in the paranasal sinuses. No acute air-fluid levels. Mastoid air cells are not opacified. Vascular calcifications. CT CERVICAL SPINE FINDINGS Postoperative changes with anterior plate and screw fixation and intervertebral fusion from C5 through C7. There is discontinuity of the fixation plate between C5 and C6 but this appearance is unchanged since previous study. Bony fusion appears intact. Normal alignment of the cervical spine. No vertebral compression deformities. No prevertebral soft tissue swelling. C1-2 articulation appears intact. Degenerative changes throughout the facet joints. No focal bone lesion or bone destruction. The upper esophagus is mildly distended and contains gas and  presumably ingested material. Dysmotility or distal esophageal stricture not excluded. IMPRESSION: No acute intracranial abnormalities. Anterior plate and screw fixation with intervertebral fusion from C5 through C7. Fracture of the hardware plate from D34-534, unchanged since previous studies. Degenerative changes in the cervical spine. No acute displaced fractures identified. Incidental note of mild dilatation and residual material in the upper esophagus. Esophageal dysmotility or distal stricture not excluded. Electronically Signed   By: Lucienne Capers M.D.   On: 07/01/2015 18:46   Ct Cervical Spine Wo Contrast  07/01/2015  CLINICAL DATA:  Increasing falls, hallucinations, and altered mental status. EXAM: CT HEAD WITHOUT CONTRAST CT CERVICAL SPINE WITHOUT CONTRAST TECHNIQUE: Multidetector CT imaging of the head and cervical spine was performed following the standard protocol without intravenous contrast. Multiplanar CT image reconstructions of the cervical spine were also generated. COMPARISON:  11/05/2014 FINDINGS: CT HEAD FINDINGS Mild cerebral atrophy. No ventricular dilatation. Patchy low-attenuation changes in the deep white matter consistent with small vessel ischemia. No mass effect or midline shift.  No abnormal extra-axial fluid collections. Gray-white matter junctions are distinct. Basal cisterns are not effaced. No evidence of acute intracranial hemorrhage. No depressed skull fractures. Mild mucosal thickening in the paranasal sinuses. No acute air-fluid levels. Mastoid air cells are not opacified. Vascular calcifications. CT CERVICAL SPINE FINDINGS Postoperative changes with anterior plate and screw fixation and intervertebral fusion from C5 through C7. There is discontinuity of the fixation plate between C5 and C6 but this appearance is unchanged since previous study. Bony fusion appears intact. Normal alignment of the cervical spine. No vertebral compression deformities. No prevertebral soft  tissue swelling. C1-2 articulation appears intact. Degenerative changes throughout the facet joints. No focal bone lesion or bone destruction. The upper esophagus is mildly distended and contains gas and presumably ingested material. Dysmotility or distal esophageal stricture not excluded. IMPRESSION: No acute intracranial abnormalities. Anterior plate and screw fixation with intervertebral fusion from C5 through C7. Fracture of the hardware plate from D34-534, unchanged since previous studies. Degenerative changes in the cervical spine. No acute displaced fractures identified. Incidental note of mild dilatation and residual material in the upper esophagus. Esophageal dysmotility or distal stricture not excluded. Electronically Signed   By: Lucienne Capers M.D.   On: 07/01/2015 18:46   Mr Brain Wo Contrast  07/02/2015  CLINICAL DATA:  Initial evaluation for acute encephalopathy. EXAM: MRI HEAD WITHOUT CONTRAST TECHNIQUE: Multiplanar, multiecho pulse sequences of the brain and surrounding structures were obtained without intravenous contrast. COMPARISON:  Prior CT from 07/01/2015. FINDINGS: Mild diffuse prominence of the CSF containing spaces is compatible with generalized age-related cerebral atrophy. Scattered patchy T2/FLAIR hyperintensity within the periventricular and deep white matter most consistent with chronic small vessel ischemic disease, mild for age. Minimal small vessel type changes present within the pons. No abnormal foci of restricted diffusion to suggest acute intracranial infarct. Gray-white matter differentiation maintained. Normal intravascular flow voids preserved. No acute or chronic intracranial hemorrhage. No areas of chronic infarction. No mass lesion, midline shift, or mass effect. No hydrocephalus. No extra-axial fluid collection. Craniocervical junction within normal limits. Visualized upper cervical spine unremarkable. Pituitary gland normal.  No acute abnormality about the orbits.  Mild mucosal thickening within the paranasal sinuses. No air-fluid levels to suggest active sinus infection. Trace opacity within the mastoid air cells. Inner ear structures grossly normal. Bone marrow signal intensity within normal limits. No scalp soft tissue abnormality. IMPRESSION: 1. No acute intracranial infarct or other process identified. 2. Mild age-related cerebral atrophy with chronic small vessel ischemic disease. Electronically Signed   By: Jeannine Boga M.D.   On: 07/02/2015 02:22     CBC  Recent Labs Lab 07/01/15 2111 07/02/15 0544  WBC 10.4 7.7  HGB 12.3 10.9*  HCT 36.6 33.9*  PLT 371 253  MCV 95.1 96.6  MCH 31.9 31.1  MCHC 33.6 32.2  RDW 14.7 14.3  LYMPHSABS 2.6  --   MONOABS 0.6  --   EOSABS 0.2  --   BASOSABS 0.0  --     Chemistries   Recent Labs Lab 07/01/15 2111 07/02/15 0544  NA 136 141  K 5.4* 3.6  CL 101 106  CO2 23 25  GLUCOSE 90 93  BUN 15 14  CREATININE 1.18* 1.16*  CALCIUM 9.1 8.9  AST 53* 21  ALT 11* 10*  ALKPHOS 109 99  BILITOT 1.4* 0.3   ------------------------------------------------------------------------------------------------------------------  Recent Labs  07/02/15 0544  CHOL 117  HDL 37*  LDLCALC 50  TRIG 152*  CHOLHDL 3.2    Lab  Results  Component Value Date   HGBA1C 6.1* 06/18/2015   ------------------------------------------------------------------------------------------------------------------ No results for input(s): TSH, T4TOTAL, T3FREE, THYROIDAB in the last 72 hours.  Invalid input(s): FREET3 ------------------------------------------------------------------------------------------------------------------ No results for input(s): VITAMINB12, FOLATE, FERRITIN, TIBC, IRON, RETICCTPCT in the last 72 hours.  Coagulation profile  Recent Labs Lab 07/02/15 0544  INR 1.13    No results for input(s): DDIMER in the last 72 hours.  Cardiac Enzymes No results for input(s): CKMB, TROPONINI,  MYOGLOBIN in the last 168 hours.  Invalid input(s): CK ------------------------------------------------------------------------------------------------------------------ No results found for: BNP  Time Spent in minutes  35   SINGH,PRASHANT K M.D on 07/03/2015 at 9:32 AM  Between 7am to 7pm - Pager - [phone]  After 7pm go to [email or web address] - password [facility]  [facility] -  Office  [phone]

## 2015-07-03 NOTE — Progress Notes (Signed)
BP 185/84 HR 105, pt asymptomatic Gave PRN hydralazine. Follow-up BP 177/87 HR 107. MD notified. Will continue to monitor.

## 2015-07-03 NOTE — Progress Notes (Signed)
Physical Therapy Treatment Patient Details Name: [name] MRN: [id number] DOB: 12-Jul-1940 Today's Date: 07/03/2015    History of Present Illness Pt adm with altered mental status, falls, and hallucinations. PMH - lumbar laminectomy, bil TKA, ankle fx, depression, frequent falls    PT Comments    Pt with much improved mental status and cognition today. Pt refusing SNF. Since pt choosing to return home will recommend HHPT. Pt with long history of falls at home so she will always be a fall risk.   Follow Up Recommendations  Home health PT;Supervision - Intermittent     Equipment Recommendations  None recommended by PT    Recommendations for Other Services       Precautions / Restrictions Precautions Precautions: Fall Restrictions Weight Bearing Restrictions: No    Mobility  Bed Mobility Overal bed mobility: Needs Assistance Bed Mobility: Supine to Sit;Sit to Supine     Supine to sit: Supervision Sit to supine: Supervision   General bed mobility comments: Incr time.   Transfers Overall transfer level: Needs assistance Equipment used: Rolling walker (2 wheeled) Transfers: Sit to/from Stand Sit to Stand: Min assist;Min guard         General transfer comment: Assist to power up from low commode. Only min guard to stand from bed.  Ambulation/Gait Ambulation/Gait assistance: Min guard Ambulation Distance (Feet): 120 Feet Assistive device: Rolling walker (2 wheeled) Gait Pattern/deviations: Step-through pattern;Decreased stride length Gait velocity: decr Gait velocity interpretation: Below normal speed for age/gender General Gait Details: Verbal cues to stay closer to walker when turning   Stairs            Wheelchair Mobility    Modified Rankin (Stroke Patients Only)       Balance Overall balance assessment: Needs assistance Sitting-balance support: No upper extremity supported;Feet supported Sitting balance-Leahy Scale: Good     Standing  balance support: No upper extremity supported;During functional activity Standing balance-Leahy Scale: Fair                      Cognition Arousal/Alertness: Awake/alert Behavior During Therapy: WFL for tasks assessed/performed Overall Cognitive Status: Within Functional Limits for tasks assessed                      Exercises      General Comments        Pertinent Vitals/Pain Pain Assessment: Faces Faces Pain Scale: Hurts a little bit Pain Location: headache Pain Descriptors / Indicators: Aching Pain Intervention(s): Limited activity within patient's tolerance;Patient requesting pain meds-RN notified    Home Living                      Prior Function            PT Goals (current goals can now be found in the care plan section) Acute Rehab PT Goals PT Goal Formulation: With patient Time For Goal Achievement: 07/16/15 Potential to Achieve Goals: Good Progress towards PT goals: Goals met and updated - see care plan    Frequency  Min 3X/week    PT Plan Discharge plan needs to be updated    Co-evaluation             End of Session Equipment Utilized During Treatment: Gait belt Activity Tolerance: Patient tolerated treatment well Patient left: in bed;with call bell/phone within reach;with bed alarm set     Time: 2536-6440 PT Time Calculation (min) (ACUTE ONLY): 15 min  Charges:  $  Gait Training: 8-22 mins                    G Codes:      Shirle Provencal 2015/07/31, 3:22 PM [facility] PT [phone]

## 2015-07-04 ENCOUNTER — Ambulatory Visit: Payer: Medicare Other | Admitting: Neurology

## 2015-07-04 LAB — HEMOGLOBIN A1C
Hgb A1c MFr Bld: 6.2 % — ABNORMAL HIGH (ref 4.8–5.6)
Mean Plasma Glucose: 131 mg/dL

## 2015-07-04 MED ORDER — FUROSEMIDE 10 MG/ML IJ SOLN
20.0000 mg | Freq: Once | INTRAMUSCULAR | Status: AC
  Administered 2015-07-04: 20 mg via INTRAVENOUS
  Filled 2015-07-04: qty 2

## 2015-07-04 MED ORDER — TRAMADOL HCL 50 MG PO TABS: 50.0000 mg | ORAL_TABLET | Freq: Four times a day (QID) | ORAL | Status: DC | PRN

## 2015-07-04 MED ORDER — OXYCODONE HCL 5 MG PO TABS
5.0000 mg | ORAL_TABLET | Freq: Once | ORAL | Status: AC
  Administered 2015-07-04: 5 mg via ORAL
  Filled 2015-07-04: qty 1

## 2015-07-04 MED ORDER — IBUPROFEN 400 MG PO TABS
400.0000 mg | ORAL_TABLET | Freq: Once | ORAL | Status: AC
Start: 2015-07-04 — End: 2015-07-04
  Administered 2015-07-04: 400 mg via ORAL
  Filled 2015-07-04: qty 1

## 2015-07-04 MED ORDER — GABAPENTIN 400 MG PO CAPS: 400.0000 mg | ORAL_CAPSULE | Freq: Four times a day (QID) | ORAL | Status: DC

## 2015-07-04 MED ORDER — SUMATRIPTAN SUCCINATE 25 MG PO TABS
25.0000 mg | ORAL_TABLET | Freq: Once | ORAL | Status: AC
  Administered 2015-07-04: 25 mg via ORAL
  Filled 2015-07-04: qty 1

## 2015-07-04 NOTE — Telephone Encounter (Signed)
LMOM for son to return call.  °

## 2015-07-04 NOTE — Progress Notes (Signed)
OT Cancellation Note  Patient Details Name: [name] MRN: [id number] DOB: [birth year]   Cancelled Treatment:    Reason Eval/Treat Not Completed: Pain limiting ability to participate. Pt found supine in bed. Pt refused therapy this afternoon due to a bad headache. Pt reports she has been getting up OOB to use BR multiple times today. Will continue to follow acutely and will check back tomorrow as schedule allows.  Chrys Racer , MS, OTR/L, CLT Pager: [phone]  07/04/2015, 2:12 PM

## 2015-07-04 NOTE — Discharge Instructions (Signed)
Follow with Primary MD [name], [name], DO in 2-3 days   Get CBC, CMP, 2 view Chest X ray checked  by Primary MD next visit.    Activity: As tolerated with Full fall precautions use walker/cane & assistance as needed   Disposition Home     Diet:   Heart Healthy .  For Heart failure patients - Check your Weight same time everyday, if you gain over 2 pounds, or you develop in leg swelling, experience more shortness of breath or chest pain, call your Primary MD immediately. Follow Cardiac Low Salt Diet and 1.5 lit/day fluid restriction.   On your next visit with your primary care physician please Get Medicines reviewed and adjusted.   Please request your Prim.MD to go over all Hospital Tests and Procedure/Radiological results at the follow up, please get all Hospital records sent to your Prim MD by signing hospital release before you go home.   If you experience worsening of your admission symptoms, develop shortness of breath, life threatening emergency, suicidal or homicidal thoughts you must seek medical attention immediately by calling 911 or calling your MD immediately  if symptoms less severe.  You Must read complete instructions/literature along with all the possible adverse reactions/side effects for all the Medicines you take and that have been prescribed to you. Take any new Medicines after you have completely understood and accpet all the possible adverse reactions/side effects.   Do not drive, operating heavy machinery, perform activities at heights, swimming or participation in water activities or provide baby sitting services if your were admitted for syncope or siezures until you have seen by Primary MD or a Neurologist and advised to do so again.  Do not drive when taking Pain medications.    Do not take more than prescribed Pain, Sleep and Anxiety Medications  Special Instructions: If you have smoked or chewed Tobacco  in the last 2 yrs please stop smoking, stop any  regular Alcohol  and or any Recreational drug use.  Wear Seat belts while driving.   Please note  You were cared for by a hospitalist during your hospital stay. If you have any questions about your discharge medications or the care you received while you were in the hospital after you are discharged, you can call the unit and asked to speak with the hospitalist on call if the hospitalist that took care of you is not available. Once you are discharged, your primary care physician will handle any further medical issues. Please note that NO REFILLS for any discharge medications will be authorized once you are discharged, as it is imperative that you return to your primary care physician (or establish a relationship with a primary care physician if you do not have one) for your aftercare needs so that they can reassess your need for medications and monitor your lab values.

## 2015-07-04 NOTE — Progress Notes (Addendum)
Pharmacist Provided - Patient Medication Education Prior to Discharge   [name] is an 75 y.o. female who presented to [facility] on 07/01/2015 with a chief complaint of  Chief Complaint  Patient presents with  . Altered Mental Status  . Hallucinations    Assessment: Spoke with patient about her discharge medications, in particular her decreased dose of gabapentin. Answered all of her medication related questions.  Time spent preparing for discharge counseling: 10 mins Time spent counseling patient: 10 mins  Darl Pikes, PharmD Clinical Pharmacist- Resident Pager: [phone]  Darl Pikes, PharmD 07/04/2015, 3:30 PM

## 2015-07-04 NOTE — Progress Notes (Signed)
Patient BP continues to stay elevated despite multiple doses of hydralazine. [name] now complaining of "the worst headache she's ever had." Patient also states she has not been on her usual Lisinopril while being in the hospital and doesn't understand why. MD has been notified and new orders were placed for pain control. Will continue to monitor.

## 2015-07-04 NOTE — Care Management Important Message (Signed)
Important Message  Patient Details  Name: [name] MRN: [id number] Date of Birth: 1941/04/17   Medicare Important Message Given:  Yes    Nathen May 07/04/2015, 2:02 PM

## 2015-07-04 NOTE — Progress Notes (Signed)
Patient was discharged home by MD order; discharged instructions review and give to patient and her son with care notes and prescriptions; IV DIC; skin intact; patient will be escorted to the car by nurse tech via wheelchair.

## 2015-07-04 NOTE — Discharge Summary (Addendum)
[name], is a 75 y.o. female  DOB 1940/09/18  MRN [id number].  Admission date:  07/01/2015  Admitting Physician  Ivor Costa, MD  Discharge Date:  07/04/2015   Primary MD  Hollace Kinnier, DO  Recommendations for primary care physician for things to follow:   Monitor for polypharmacy. Check CBC BMP in a week.   Admission Diagnosis  Delirium [R41.0] Acute encephalopathy [G93.40]   Discharge Diagnosis  Delirium [R41.0] Acute encephalopathy [G93.40]     Principal Problem:   Acute encephalopathy Active Problems:   Fall   TIA (transient ischemic attack)   HTN (hypertension)   Hyperlipemia   Depression   Severe obesity (BMI >= 40) (HCC)   Degenerative disc disease, lumbar   Back pain   Visual hallucination      Past Medical History  Diagnosis Date  . GERD (gastroesophageal reflux disease)     hx pud '98  . Arthritis     djd  . Restless leg syndrome     takes Sinemet daily  . 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 Dysrhythmia     hx tachy palpitations  . Neuromuscular disorder ([place])     peripheral neuropathy, FEET AND LEGS  . Stroke [facility])     ? 6 months ago - tests okay - [facility]   . Cellulitis     10/11/11 hospitalized for cellulitis   . Hyperlipemia     takes Atorvastatin daily  . Peripheral neuropathy ([place])   . Elbow fracture, left April 2015  . Depression     takes Zoloft daily  . Vertigo     takes Meclizine daily as needed  . Insomnia     takes restoril nightly as needed  . Hypertension     takes HCTZ daily  . Peripheral neuropathy (HCC)     takes Gabapentin daily  . History of bronchitis     many yrs ago  . Headache(784.0)     migraines yrs ago-takes Imitrex daily  . Joint pain   . Joint swelling   . Chronic back pain     scoliosis/stenosis  . Anxiety     takes  Ativan daily as needed  . Cataracts, bilateral     immature   . Depression     Past Surgical History  Procedure Laterality Date  . Cervical disc surgery x2  2011  . Rt carotid enarterectomy  2011  . Rt knee arthroscopy  '99  . Left knee arthroscopy  2001  . Hematoma evacuation  2011    s/p rt cea  . Joint replacement  '01    total knee replacement, LEFT  . Abdominal hysterectomy  1975    bil oophorectomy  . Back surgery   MANY YRS AGO    laminectomy x3  . Cholecystectomy  1993  . Total knee arthroplasty  09/13/2011    Procedure: TOTAL KNEE ARTHROPLASTY;  Surgeon: Magnus Sinning, MD;  Location: [facility];  Service: Orthopedics;  Laterality: Right;  . Right knee replacement      09/2011   . Carpal tunnel release  07/09/2012    Procedure: CARPAL TUNNEL RELEASE;  Surgeon: Magnus Sinning, MD;  Location: [facility];  Service: Orthopedics;  Laterality: Left;  . Finger arthroplasty  07/09/2012    Procedure: FINGER ARTHROPLASTY;  Surgeon: Magnus Sinning, MD;  Location: [facility];  Service: Orthopedics;  Laterality: Left;  Interposition Arthroplasty [facility] Joint Thumb Left   . Lipoma excision  07/09/2012    Procedure: EXCISION LIPOMA;  Surgeon: Magnus Sinning, MD;  Location: [facility];  Service: Orthopedics;  Laterality: Left;  Excision Lipoma Dorsum Left Wrist   . Orif ankle fracture Right 09/14/2014    FIBULA   . Orif ankle fracture Right 09/14/2014    Procedure: OPEN REDUCTION INTERNAL FIXATION (ORIF) RIGHT ANKLE FRACTURE/SYNDESMOSIS ;  Surgeon: Wylene Simmer, MD;  Location: [facility];  Service: Orthopedics;  Laterality: Right;  . Colonosocpy    . Esophagogastroduodenoscopy    . Lumbar laminectomy with coflex 1 level Bilateral 04/19/2015    Procedure: LUMBAR TWO-THREE LUMBAR LAMINECTOMY WITH COFLEX ;  Surgeon: Kristeen Miss, MD;  Location: [place] [facility];  Service: Neurosurgery;  Laterality: Bilateral;  Bilateral L23 laminectomy and foraminotomy with coflex       HPI  from the history and physical done  on the day of admission:   [name] is a 75 y.o. female with PMH of TIA, hypertension, hyperlipidemia, depression, anxiety, stroke, vertigo, chronic back pain, peripheral neuropathy, OSA, obesity, TIA, who presents with altered mental status, fall and visual hallucination.  Patient has AMS, and is unable to provide accurate medical history, therefore, most of the history is obtained by discussing the case with ED physician, per EMS report, and with the nursing staff. Per report, she was recently admitted to behavioral health due to severe depression and discharged 3 days ago. She has been living at home by herself. As per the son, patient has 4 episodes of falling since 3 AM yesterday. Patient states that she just feels diffusely weak. Denies passing out. As per the son, patient has been confused and having visual hallucinations, seeing things on the wall. She has questionable slurred speech. Patient does not have chest pain, abdominal pain, diarrhea, symptoms of UTI, unilateral weakness, numbness or kidney sensations. No hearing loss.  In ED, patient was found to have negative urinalysis, WBC 10.4, temperature normal, no tachycardia, potassium 5.4 without T-wave peaking, worsening renal function, Tylenol level less than 10, salicylate level less than 4, negative troponin, Penny UDS. Negative chest x-ray, negative CT head and C-spine for acute abnormalities. Patient is admitted to inpatient for further eval and treatment.  EKG: Independently       Hospital Course:     1. Toxic/metabolic encephalopathy. Most likely due to combination of mild dehydration and sedative medications, she is much improved and back to her baseline after hydration and holding of offending medications which included hydrocodone, Neurontin, meclizine, Claritin, trazodone. Head CT and MRI brain nonacute, no focal deficits, no headache or neck stiffness. [name] close to her baseline. Seen by PT and will qualify for home  health PT and RN which I will order, her Neurontin has been restarted at a lower dose, she is tolerating tramadol for pain and appears comfortable, alert awake oriented 3. She has been extensively counseled not to overuse or take excess medications. Family also counseled yesterday. Left are detailed message for son today.  2. History of  TIA/CVA in the past. No focal deficits, MRI brain nonacute, continue aspirin and statin for secondary prevention.  3. Dyslipidemia. Home dose statin continued.  4. Chronic back pain and peripheral neuropathy. Minimize narcotics, resume Neurontin, Tylenol if needed. Patient does not seem to be in any distress whatsoever. However she is upset that she is not getting narcotics. Explained to her in detail 3 today upon why narcotics are not given, Neurontin was reduced, blood pressure medication was held as an 7 below.  5. History of depression. Continue Cymbalta.  6. Chronic T-wave changes on EKG. Chest pain free troponin negative. Recommend one-time outpatient follow-up with cardiology post discharge. The new aspirin and statin for secondary prevention.  7. Essential hypertension. When she came she was dehydrated with hyperkalemia. Her ACE inhibitor and blood pressure was held. She was hydrated. Potassium is not low blood pressure is stable. Resume ACE inhibitor upon discharge.   Discharge Condition: None  Follow UP  Follow-up Information    Follow up with [name], [name], DO. Schedule an appointment as soon as possible for a visit in 2 days.   Specialty:  Geriatric Medicine   Contact information:   [address] [place] [address] [phone]       Follow up with Nahser, Wonda Cheng, MD. Schedule an appointment as soon as possible for a visit in 1 week.   Specialty:  Cardiology   Contact information:   [address] [place] [address] [phone]        Consults obtained - none  Diet and Activity recommendation: See Discharge  Instructions below  Discharge Instructions           Discharge Instructions    Diet - low sodium heart healthy    Complete by:  As directed      Discharge instructions    Complete by:  As directed   Follow with Primary MD [name], [name], DO in 2-3 days   Get CBC, CMP, 2 view Chest X ray checked  by Primary MD next visit.    Activity: As tolerated with Full fall precautions use walker/cane & assistance as needed   Disposition Home     Diet:   Heart Healthy .  For Heart failure patients - Check your Weight same time everyday, if you gain over 2 pounds, or you develop in leg swelling, experience more shortness of breath or chest pain, call your Primary MD immediately. Follow Cardiac Low Salt Diet and 1.5 lit/day fluid restriction.   On your next visit with your primary care physician please Get Medicines reviewed and adjusted.   Please request your Prim.MD to go over all Hospital Tests and Procedure/Radiological results at the follow up, please get all Hospital records sent to your Prim MD by signing hospital release before you go home.   If you experience worsening of your admission symptoms, develop shortness of breath, life threatening emergency, suicidal or homicidal thoughts you must seek medical attention immediately by calling 911 or calling your MD immediately  if symptoms less severe.  You Must read complete instructions/literature along with all the possible adverse reactions/side effects for all the Medicines you take and that have been prescribed to you. Take any new Medicines after you have completely understood and accpet all the possible adverse reactions/side effects.   Do not drive, operating heavy machinery, perform activities at heights, swimming or participation in water activities or provide baby sitting services if your were admitted for syncope or siezures until you  have seen by Primary MD or a Neurologist and advised to do so again.  Do not drive when taking  Pain medications.    Do not take more than prescribed Pain, Sleep and Anxiety Medications  Special Instructions: If you have smoked or chewed Tobacco  in the last 2 yrs please stop smoking, stop any regular Alcohol  and or any Recreational drug use.  Wear Seat belts while driving.   Please note  You were cared for by a hospitalist during your hospital stay. If you have any questions about your discharge medications or the care you received while you were in the hospital after you are discharged, you can call the unit and asked to speak with the hospitalist on call if the hospitalist that took care of you is not available. Once you are discharged, your primary care physician will handle any further medical issues. Please note that NO REFILLS for any discharge medications will be authorized once you are discharged, as it is imperative that you return to your primary care physician (or establish a relationship with a primary care physician if you do not have one) for your aftercare needs so that they can reassess your need for medications and monitor your lab values.     Increase activity slowly    Complete by:  As directed              Discharge Medications       Medication List    STOP taking these medications        HYDROcodone-acetaminophen 5-325 MG tablet  Commonly known as:  NORCO/VICODIN     lidocaine 5 %  Commonly known as:  LIDODERM     loratadine 10 MG tablet  Commonly known as:  CLARITIN     traZODone 50 MG tablet  Commonly known as:  DESYREL      TAKE these medications        aspirin 325 MG tablet  Take 1 tablet (325 mg total) by mouth daily. For heart health     atorvastatin 40 MG tablet  Commonly known as:  LIPITOR  Take one tablet by mouth once daily for cholesterol     cholecalciferol 1000 units tablet  Commonly known as:  VITAMIN D  Take 1 tablet (1,000 Units total) by mouth daily. For bone health     docusate sodium 100 MG capsule  Commonly known  as:  COLACE  Take 1 capsule (100 mg total) by mouth daily. For constipation     DULoxetine 60 MG capsule  Commonly known as:  CYMBALTA  Take 1 capsule (60 mg total) by mouth daily. For depression     gabapentin 400 MG capsule  Commonly known as:  NEURONTIN  Take 1 capsule (400 mg total) by mouth 4 (four) times daily. For pain management/agitation     lisinopril 5 MG tablet  Commonly known as:  PRINIVIL,ZESTRIL  Take 1 tablet (5 mg total) by mouth daily. For high blood pressure     meclizine 25 MG tablet  Commonly known as:  ANTIVERT  Take one tablet by mouth once daily as needed for dizziness.     potassium chloride 10 MEQ tablet  Commonly known as:  K-DUR,KLOR-CON  Take 1 tablet (10 mEq total) by mouth daily. For low potassium     pyridOXINE 50 MG tablet  Commonly known as:  VITAMIN B-6  Take 1 tablet (50 mg total) by mouth daily. For Vitamin B-6 supplement     sodium  chloride 0.65 % Soln nasal spray  Commonly known as:  OCEAN  Place 1 spray into both nostrils as needed for congestion.     SUMAtriptan 50 MG tablet  Commonly known as:  IMITREX  Take 50 mg by mouth as needed for migraine or headache     traMADol 50 MG tablet  Commonly known as:  ULTRAM  Take 1 tablet (50 mg total) by mouth every 6 (six) hours as needed for moderate pain.        Major procedures and Radiology Reports - PLEASE review detailed and final reports for all details, in brief -       Dg Chest 2 View  07/01/2015  CLINICAL DATA:  Altered mental status for 1 week. Initial encounter. EXAM: CHEST  2 VIEW COMPARISON:  PA and lateral chest 10/11/2011.  CT chest 09/17/2014. FINDINGS: Elevation of the right hemidiaphragm is unchanged. Lungs are clear. There is cardiomegaly. No pneumothorax or pleural effusion. IMPRESSION: Cardiomegaly without acute disease. Electronically Signed   By: Inge Rise M.D.   On: 07/01/2015 18:19   Ct Head Wo Contrast  07/01/2015  CLINICAL DATA:  Increasing falls,  hallucinations, and altered mental status. EXAM: CT HEAD WITHOUT CONTRAST CT CERVICAL SPINE WITHOUT CONTRAST TECHNIQUE: Multidetector CT imaging of the head and cervical spine was performed following the standard protocol without intravenous contrast. Multiplanar CT image reconstructions of the cervical spine were also generated. COMPARISON:  11/05/2014 FINDINGS: CT HEAD FINDINGS Mild cerebral atrophy. No ventricular dilatation. Patchy low-attenuation changes in the deep white matter consistent with small vessel ischemia. No mass effect or midline shift. No abnormal extra-axial fluid collections. Gray-white matter junctions are distinct. Basal cisterns are not effaced. No evidence of acute intracranial hemorrhage. No depressed skull fractures. Mild mucosal thickening in the paranasal sinuses. No acute air-fluid levels. Mastoid air cells are not opacified. Vascular calcifications. CT CERVICAL SPINE FINDINGS Postoperative changes with anterior plate and screw fixation and intervertebral fusion from C5 through C7. There is discontinuity of the fixation plate between C5 and C6 but this appearance is unchanged since previous study. Bony fusion appears intact. Normal alignment of the cervical spine. No vertebral compression deformities. No prevertebral soft tissue swelling. C1-2 articulation appears intact. Degenerative changes throughout the facet joints. No focal bone lesion or bone destruction. The upper esophagus is mildly distended and contains gas and presumably ingested material. Dysmotility or distal esophageal stricture not excluded. IMPRESSION: No acute intracranial abnormalities. Anterior plate and screw fixation with intervertebral fusion from C5 through C7. Fracture of the hardware plate from D34-534, unchanged since previous studies. Degenerative changes in the cervical spine. No acute displaced fractures identified. Incidental note of mild dilatation and residual material in the upper esophagus. Esophageal  dysmotility or distal stricture not excluded. Electronically Signed   By: Lucienne Capers M.D.   On: 07/01/2015 18:46   Ct Cervical Spine Wo Contrast  07/01/2015  CLINICAL DATA:  Increasing falls, hallucinations, and altered mental status. EXAM: CT HEAD WITHOUT CONTRAST CT CERVICAL SPINE WITHOUT CONTRAST TECHNIQUE: Multidetector CT imaging of the head and cervical spine was performed following the standard protocol without intravenous contrast. Multiplanar CT image reconstructions of the cervical spine were also generated. COMPARISON:  11/05/2014 FINDINGS: CT HEAD FINDINGS Mild cerebral atrophy. No ventricular dilatation. Patchy low-attenuation changes in the deep white matter consistent with small vessel ischemia. No mass effect or midline shift. No abnormal extra-axial fluid collections. Gray-white matter junctions are distinct. Basal cisterns are not effaced. No evidence of acute intracranial hemorrhage.  No depressed skull fractures. Mild mucosal thickening in the paranasal sinuses. No acute air-fluid levels. Mastoid air cells are not opacified. Vascular calcifications. CT CERVICAL SPINE FINDINGS Postoperative changes with anterior plate and screw fixation and intervertebral fusion from C5 through C7. There is discontinuity of the fixation plate between C5 and C6 but this appearance is unchanged since previous study. Bony fusion appears intact. Normal alignment of the cervical spine. No vertebral compression deformities. No prevertebral soft tissue swelling. C1-2 articulation appears intact. Degenerative changes throughout the facet joints. No focal bone lesion or bone destruction. The upper esophagus is mildly distended and contains gas and presumably ingested material. Dysmotility or distal esophageal stricture not excluded. IMPRESSION: No acute intracranial abnormalities. Anterior plate and screw fixation with intervertebral fusion from C5 through C7. Fracture of the hardware plate from D34-534, unchanged  since previous studies. Degenerative changes in the cervical spine. No acute displaced fractures identified. Incidental note of mild dilatation and residual material in the upper esophagus. Esophageal dysmotility or distal stricture not excluded. Electronically Signed   By: Lucienne Capers M.D.   On: 07/01/2015 18:46   Mr Brain Wo Contrast  07/02/2015  CLINICAL DATA:  Initial evaluation for acute encephalopathy. EXAM: MRI HEAD WITHOUT CONTRAST TECHNIQUE: Multiplanar, multiecho pulse sequences of the brain and surrounding structures were obtained without intravenous contrast. COMPARISON:  Prior CT from 07/01/2015. FINDINGS: Mild diffuse prominence of the CSF containing spaces is compatible with generalized age-related cerebral atrophy. Scattered patchy T2/FLAIR hyperintensity within the periventricular and deep white matter most consistent with chronic small vessel ischemic disease, mild for age. Minimal small vessel type changes present within the pons. No abnormal foci of restricted diffusion to suggest acute intracranial infarct. Gray-white matter differentiation maintained. Normal intravascular flow voids preserved. No acute or chronic intracranial hemorrhage. No areas of chronic infarction. No mass lesion, midline shift, or mass effect. No hydrocephalus. No extra-axial fluid collection. Craniocervical junction within normal limits. Visualized upper cervical spine unremarkable. Pituitary gland normal.  No acute abnormality about the orbits. Mild mucosal thickening within the paranasal sinuses. No air-fluid levels to suggest active sinus infection. Trace opacity within the mastoid air cells. Inner ear structures grossly normal. Bone marrow signal intensity within normal limits. No scalp soft tissue abnormality. IMPRESSION: 1. No acute intracranial infarct or other process identified. 2. Mild age-related cerebral atrophy with chronic small vessel ischemic disease. Electronically Signed   By: Jeannine Boga M.D.   On: 07/02/2015 02:22    Micro Results      No results found for this or any previous visit (from the past 240 hour(s)).     Today   Subjective    Teira Marcoe today has no headache,no chest abdominal pain,no new weakness tingling or numbness, feels much better wants to go home today.     Objective   Blood pressure 144/53, pulse 109, temperature 97.9 F (36.6 C), temperature source Oral, resp. rate 18, height 5\' 5"  (1.651 m), weight 92.488 kg (203 lb 14.4 oz), SpO2 98 %.   Intake/Output Summary (Last 24 hours) at 07/04/15 1005 Last data filed at 07/04/15 0832  Gross per 24 hour  Intake    864 ml  Output      0 ml  Net    864 ml    Exam Awake Alert, Oriented x 3, No new F.N deficits, Normal affect Spry.AT,PERRAL Supple Neck,No JVD, No cervical lymphadenopathy appriciated.  Symmetrical Chest wall movement, Good air movement bilaterally, CTAB RRR,No Gallops,Rubs or new Murmurs, No Parasternal  Heave +ve B.Sounds, Abd Soft, Non tender, No organomegaly appriciated, No rebound -guarding or rigidity. No Cyanosis, Clubbing or edema, No new Rash or bruise   Data Review   CBC w Diff:  Lab Results  Component Value Date   WBC 7.7 07/02/2015   WBC 7.5 11/11/2014   HGB 10.9* 07/02/2015   HCT 33.9* 07/02/2015   HCT 39.8 11/11/2014   PLT 253 07/02/2015   PLT 301 11/11/2014   LYMPHOPCT 25 07/01/2015   MONOPCT 6 07/01/2015   EOSPCT 2 07/01/2015   BASOPCT 0 07/01/2015    CMP:  Lab Results  Component Value Date   NA 141 07/02/2015   NA 136 12/09/2014   K 3.6 07/02/2015   CL 106 07/02/2015   CO2 25 07/02/2015   BUN 14 07/02/2015   BUN 27 12/09/2014   CREATININE 1.16* 07/02/2015   PROT 5.4* 07/02/2015   PROT 7.0 01/11/2014   ALBUMIN 2.6* 07/02/2015   ALBUMIN 4.2 01/11/2014   BILITOT 0.3 07/02/2015   ALKPHOS 99 07/02/2015   AST 21 07/02/2015   ALT 10* 07/02/2015  .   Total Time in preparing paper work, data evaluation and todays exam - 35  minutes  Thurnell Lose M.D on 07/04/2015 at 10:05 AM  [facility]   Office  [phone]

## 2015-07-04 NOTE — Care Management Note (Addendum)
Case Management Note  Patient Details  Name: [name] MRN: [id number] Date of Birth: [birth year]  Subjective/Objective:                 Patient admitted from home with recent discharge from [facility]. She lives at home alone, in [place] in a townhouse. She states she drives and gets her meds through mail order. She states she has a walker.    Action/Plan:  Referral placed to [facility] for [facility] PT OT RN [place] could not supply services needed, referral made to [facility] and accepted.  Expected Discharge Date:                  Expected Discharge Plan:  [place]  In-House Referral:     Discharge planning Services  CM Consult  Post Acute Care Choice:  Home Health Choice offered to:  Patient  DME Arranged:    DME Agency:     HH Arranged:  RN, PT, OT, Nurse's Aide, Social Work [place] Status of Service:  Completed, signed off  Medicare Important Message Given:    Date Medicare IM Given:    Medicare IM give by:    Date Additional Medicare IM Given:    Additional Medicare Important Message give by:     If discussed at [place] of Stay Meetings, dates discussed:    Additional Comments:  Carles Collet, RN 07/04/2015, 10:36 AM

## 2015-07-06 NOTE — Telephone Encounter (Signed)
Patient has an appointment scheduled to follow up from discharge 07/07/15.

## 2015-07-07 ENCOUNTER — Ambulatory Visit (INDEPENDENT_AMBULATORY_CARE_PROVIDER_SITE_OTHER): Payer: Medicare Other | Admitting: Internal Medicine

## 2015-07-07 ENCOUNTER — Telehealth: Payer: Self-pay

## 2015-07-07 ENCOUNTER — Encounter: Payer: Self-pay | Admitting: Internal Medicine

## 2015-07-07 VITALS — BP 132/76 | HR 96 | Temp 97.4°F

## 2015-07-07 DIAGNOSIS — Z9189 Other specified personal risk factors, not elsewhere classified: Secondary | ICD-10-CM | POA: Diagnosis not present

## 2015-07-07 DIAGNOSIS — M4806 Spinal stenosis, lumbar region: Secondary | ICD-10-CM

## 2015-07-07 DIAGNOSIS — G63 Polyneuropathy in diseases classified elsewhere: Secondary | ICD-10-CM | POA: Diagnosis not present

## 2015-07-07 DIAGNOSIS — I10 Essential (primary) hypertension: Secondary | ICD-10-CM

## 2015-07-07 DIAGNOSIS — F333 Major depressive disorder, recurrent, severe with psychotic symptoms: Secondary | ICD-10-CM | POA: Diagnosis not present

## 2015-07-07 DIAGNOSIS — E785 Hyperlipidemia, unspecified: Secondary | ICD-10-CM | POA: Insufficient documentation

## 2015-07-07 DIAGNOSIS — R296 Repeated falls: Secondary | ICD-10-CM | POA: Diagnosis not present

## 2015-07-07 DIAGNOSIS — M48062 Spinal stenosis, lumbar region with neurogenic claudication: Secondary | ICD-10-CM

## 2015-07-07 MED ORDER — LISINOPRIL 5 MG PO TABS: 5.0000 mg | ORAL_TABLET | Freq: Every day | ORAL | Status: DC

## 2015-07-07 MED ORDER — GABAPENTIN 400 MG PO CAPS: 400.0000 mg | ORAL_CAPSULE | Freq: Four times a day (QID) | ORAL | Status: DC

## 2015-07-07 MED ORDER — ACETAMINOPHEN 500 MG PO TABS: 1000.0000 mg | ORAL_TABLET | Freq: Three times a day (TID) | ORAL | Status: DC

## 2015-07-07 MED ORDER — SERTRALINE HCL 50 MG PO TABS: 100.0000 mg | ORAL_TABLET | Freq: Every day | ORAL | Status: DC

## 2015-07-07 MED ORDER — POTASSIUM CHLORIDE CRYS ER 10 MEQ PO TBCR: 10.0000 meq | EXTENDED_RELEASE_TABLET | Freq: Every day | ORAL | Status: DC

## 2015-07-07 MED ORDER — SUMATRIPTAN SUCCINATE 50 MG PO TABS: ORAL_TABLET | ORAL | Status: DC

## 2015-07-07 MED ORDER — CLONIDINE HCL 0.1 MG PO TABS: ORAL_TABLET | ORAL | Status: DC

## 2015-07-07 NOTE — Telephone Encounter (Signed)
I spent over an hour with the patient and her son this morning sorting out of her meds.  I had not seen her since July and she's been hospitalized three times since then for back surgery and twice with behavioral health.  [name] depressed and has some chronic pain.  [name] been taking the wrong doses of meds and overdosing on pain meds, benadryl and insomnia pills.  Her son has been managing her finances b/c she is not able to recently and has made multiple mistakes in her checkbook.  I fixed the med list as best I could with the information I had.  [name] to be taking lisinopril 5mg  daily (1/4 of the already extremely small 20mg ), kcl 35meq daily (1/2 of a 32meq) and gabapentin 400mg  as directed in med list given in avs.    I can't do anything about the gabapentin if the pharmacy won't fill it b/c the insurance won't cover it.  Will they fill it if she pays out of pocket for it?  That's the only solution I am aware of.

## 2015-07-07 NOTE — Telephone Encounter (Signed)
Patient has a lot of confusion with her medications after visit today. Patient went to the pharmacy to get medications filled and the pharmacy will not fill them due to too early on 3 medications Gabapentin, lisinopril and potassium.  I called the pharmacy and the pharmacist stated that if medications were changed to a lower dose the insurance will not override filling medications early, patient will need to may current pills equlivent to what she is taking, for example if patient was told to take potassium at 10 meq and currently has 20 meq tablets she needs to take 1/2 of the 20 meq.   Patient's son will come back to the office at 4pm for a consultation with triage assistant to get medications straight.  Patient has gabapentin 800 mg pill bottle at home that is empty, patient's current dose is lower than 800 mg (400 mg). Pharmacy will not fill until 07/21/15. Patient's son states she does not need to be without this medication and would like to know what to do, please advise on Gabapentin?

## 2015-07-07 NOTE — Patient Instructions (Addendum)
Stop tramadol Use tylenol 1000mg  three times daily for pain Resume zoloft 100mg  daily for depression Keep your appt with Dr. Geoffry Paradise Continue with the 400mg  of gabapentin NOT 600mg  due to your kidney function and it's sedating properties

## 2015-07-07 NOTE — Telephone Encounter (Signed)
Patient's son was in office, medication issues resolved. I called [employer] and had them cancel any pending rx's to be filled or processed.  I then called [facility] and had then try rx's again that was sent over today. RX's approved and will be ready for pick-up shortly. New updated medication list was given to patient's son

## 2015-07-07 NOTE — Progress Notes (Signed)
Patient ID: [name], female   DOB: 1941/03/03, 75 y.o.   MRN: [id number]   Location: [place] Provider: Rexene Edison. Mariea Clonts, D.O., C.M.D.  Code Status: Full code Goals of Care: Advanced Directive information Does patient have an advance directive?: Yes, Type of Advance Directive: [place];Living will  Chief Complaint  Patient presents with  . Hospitalization Follow-up    Acute encephalopathy 07/01/15 to 07/04/15. Here with son Konrad Dolores    HPI: Patient is a 75 y.o. female seen in the office today for hospital f/u.  [name] been hospitalized three times since I last saw her actually and she's been refusing to come in for various reasons.    Lumbar stenosis underwent surgery 04/19/15 with Dr. Laymond [facility] for 2 wks Back still painful at times.  Lower back.  Leg pain resolved.  Bowels are moving--not loose but was up going last night. Elevated bp at [facility]--lisinopril, hctz, clonidine 0.1mg  po bid prn Had a spell of high bp when ace and diuretic were on hold 180 with terrible headache BP in 130s at home she reports She refused home health therapy though she was sent with it. 06/15/15 hospitalized for depression at [facility].   They asked me to f/u on her polypharmacy at discharge.   Confused, weak, falling discharged from behavioral health tues, then went to Dr. Geoffry Paradise on Friday.  That's when she asks what causes her to not remember things Had been picking things off her clothes.  She literally could not move.  Visual hallucinations.  Trazodone had 30 pills when filled, but it was empty when her son went through her medications.  She says she needs one on one time with a therapist.  She has an appt again with Dr. Geoffry Paradise on 1/7.   She doesn't know why she refuses to go out for appts--her son had to drag her this am out of bed. Her son shakes his head when I ask about her mood. Not happy but not in the pits.  He says she is in the pits.     She reports her alarm clock didn't go off and he had to pull teeth to get her here. Her son reports that every three days she is depressed again/zonked and crying and not taking care of herself.   Says she hurts so bad in her low back.   Reports she has not used the benadryl that is in her medication bag, but there are about a dozen pills in the 600 capsule bottle which expires 4/18.  Says she's had it for years.  Pain worst when she is up walking or standing, putting weight on her feet.  Right leg doesn't want to do right when she walks.  That's the one she had the ankle surgery.  Says it's weak and makes her fall.  Gives way on her.   Left side of back more painful than right.  Does walk with walker at home.  Her son reports that she is hardly getting up at all, spending most of her time in bed, not always eating.   Says she misses her boys and her husband.  Does talk to her boys on the phone regularly.  Lives in [place].  Does have neighbors around her age or older.  Some young couples.  They do have some activities at the clubhouse.  Hasn't been going to those when her husband was alive which was several years ago now.  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  Has not grieved her husband due to dealing with herself, she says.  She did not do her therapy at the [facility] and then refused her home health therapy.  Lynnell Jude to call advance home care so they come out before he leaves for his anniversary trip with his wife.  Review of Systems:  Review of Systems  Constitutional: Positive for malaise/fatigue. Negative for fever and chills.  Eyes: Negative for blurred vision.       Glasses  Respiratory: Negative for shortness of breath.   Cardiovascular: Negative for chest pain and leg swelling.  Gastrointestinal: Negative for abdominal pain, diarrhea, constipation, blood in stool and melena.  Genitourinary: Negative for dysuria.  Musculoskeletal: Positive for back pain,  joint pain and falls.       At least 3 falls in past 24 hrs; pain in lower back centrally and at her lateral ankle (had ankle surgery previously)  Skin: Negative for rash.  Neurological: Positive for weakness and headaches. Negative for dizziness.  Psychiatric/Behavioral: Positive for depression, hallucinations and memory loss. Negative for suicidal ideas and substance abuse. The patient is nervous/anxious and has insomnia.        Apparently has taken an excess of trazodone per her son's research work    Past Medical History  Diagnosis Date  . GERD (gastroesophageal reflux disease)     hx pud '98  . Arthritis     djd  . Restless leg syndrome     takes Sinemet daily  . 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 Dysrhythmia     hx tachy palpitations  . Neuromuscular disorder ([place])     peripheral neuropathy, FEET AND LEGS  . Stroke [facility])     ? 6 months ago - tests okay - [facility]   . Cellulitis     10/11/11 hospitalized for cellulitis   . Hyperlipemia     takes Atorvastatin daily  . Peripheral neuropathy ([place])   . Elbow fracture, left April 2015  . Depression     takes Zoloft daily  . Vertigo     takes Meclizine daily as needed  . Insomnia     takes restoril nightly as needed  . Hypertension     takes HCTZ daily  . Peripheral neuropathy ([facility])     takes Gabapentin daily  . History of bronchitis     many yrs ago  . Headache(784.0)     migraines yrs ago-takes Imitrex daily  . Joint pain   . Joint swelling   . Chronic back pain     scoliosis/stenosis  . Anxiety     takes Ativan daily as needed  . Cataracts, bilateral     immature   . Depression     Past Surgical History  Procedure Laterality Date  . Cervical disc surgery x2  2011  . Rt carotid enarterectomy  2011  . Rt knee arthroscopy  '99  . Left knee arthroscopy  2001  . Hematoma evacuation  2011    s/p rt cea  . Joint replacement  '01    total knee replacement, LEFT  . Abdominal hysterectomy  1975     bil oophorectomy  . Back surgery   MANY YRS AGO    laminectomy x3  . Cholecystectomy  1993  . Total knee arthroplasty  09/13/2011    Procedure: TOTAL KNEE ARTHROPLASTY;  Surgeon: Magnus Sinning, MD;  Location:  [facility];  Service: Orthopedics;  Laterality: Right;  . Right knee replacement      09/2011   . Carpal tunnel release  07/09/2012    Procedure: CARPAL TUNNEL RELEASE;  Surgeon: Magnus Sinning, MD;  Location: [facility];  Service: Orthopedics;  Laterality: Left;  . Finger arthroplasty  07/09/2012    Procedure: FINGER ARTHROPLASTY;  Surgeon: Magnus Sinning, MD;  Location: [facility];  Service: Orthopedics;  Laterality: Left;  Interposition Arthroplasty [facility] Joint Thumb Left   . Lipoma excision  07/09/2012    Procedure: EXCISION LIPOMA;  Surgeon: Magnus Sinning, MD;  Location: [facility];  Service: Orthopedics;  Laterality: Left;  Excision Lipoma Dorsum Left Wrist   . Orif ankle fracture Right 09/14/2014    FIBULA   . Orif ankle fracture Right 09/14/2014    Procedure: OPEN REDUCTION INTERNAL FIXATION (ORIF) RIGHT ANKLE FRACTURE/SYNDESMOSIS ;  Surgeon: Wylene Simmer, MD;  Location: [place];  Service: Orthopedics;  Laterality: Right;  . Colonosocpy    . Esophagogastroduodenoscopy    . Lumbar laminectomy with coflex 1 level Bilateral 04/19/2015    Procedure: LUMBAR TWO-THREE LUMBAR LAMINECTOMY WITH COFLEX ;  Surgeon: Kristeen Miss, MD;  Location: [place] [facility];  Service: Neurosurgery;  Laterality: Bilateral;  Bilateral L23 laminectomy and foraminotomy with coflex    Allergies  Allergen Reactions  . Contrast Media [Iodinated Diagnostic Agents] Other (See Comments)    Respiratory failure  . Acyclovir And Related Other (See Comments)    unknown  . Sulfa Drugs Cross Reactors Nausea And Vomiting      Medication List       This list is accurate as of: 07/07/15  9:25 AM.  Always use your most recent med list.               aspirin 325 MG tablet  Take 1 tablet (325 mg total) by mouth daily. For heart  health     atorvastatin 40 MG tablet  Commonly known as:  LIPITOR  Take one tablet by mouth once daily for cholesterol     cholecalciferol 1000 units tablet  Commonly known as:  VITAMIN D  Take 1 tablet (1,000 Units total) by mouth daily. For bone health     docusate sodium 100 MG capsule  Commonly known as:  COLACE  Take 1 capsule (100 mg total) by mouth daily. For constipation     DULoxetine 60 MG capsule  Commonly known as:  CYMBALTA  Take 1 capsule (60 mg total) by mouth daily. For depression     gabapentin 400 MG capsule  Commonly known as:  NEURONTIN  Take 1 capsule (400 mg total) by mouth 4 (four) times daily. For pain management/agitation     lisinopril 5 MG tablet  Commonly known as:  PRINIVIL,ZESTRIL  Take 1 tablet (5 mg total) by mouth daily. For high blood pressure     meclizine 25 MG tablet  Commonly known as:  ANTIVERT  Take one tablet by mouth once daily as needed for dizziness.     potassium chloride 10 MEQ tablet  Commonly known as:  K-DUR,KLOR-CON  Take 1 tablet (10 mEq total) by mouth daily. For low potassium     pyridOXINE 50 MG tablet  Commonly known as:  VITAMIN B-6  Take 1 tablet (50 mg total) by mouth daily. For Vitamin B-6 supplement     sodium chloride 0.65 % Soln nasal spray  Commonly known as:  OCEAN  Place 1 spray into both nostrils as  needed for congestion.     SUMAtriptan 50 MG tablet  Commonly known as:  IMITREX  Take 50 mg by mouth as needed for migraine or headache     traMADol 50 MG tablet  Commonly known as:  ULTRAM  Take 1 tablet (50 mg total) by mouth every 6 (six) hours as needed for moderate pain.        Health Maintenance  Topic Date Due  . TETANUS/TDAP  01/26/1961  . COLONOSCOPY  01/27/1992  . ZOSTAVAX  01/26/2002  . DEXA SCAN  01/27/2007  . MAMMOGRAM  12/31/2009  . INFLUENZA VACCINE  01/03/2015  . PNA vac Low Risk Adult  Completed    Physical Exam: Filed Vitals:   07/07/15 0913  BP: 132/76  Pulse: 96    Temp: 97.4 F (36.3 C)  TempSrc: Oral  SpO2: 96%   There is no weight on file to calculate BMI. Physical Exam  Constitutional: [name] oriented to person, place, and time. No distress.  Sitting up in wheelchair wearing her nightgown and robe, slippers, disheveled appearance  HENT:  Head: Normocephalic and atraumatic.  Cardiovascular: Normal rate, regular rhythm, normal heart sounds and intact distal pulses.   Pulmonary/Chest: Effort normal and breath sounds normal. No respiratory distress.  Abdominal: Bowel sounds are normal.  Musculoskeletal: Normal range of motion. She exhibits tenderness.  Right lateral ankle and lower lumbar spine  Neurological: [name] alert and oriented to person, place, and time.  Skin: Skin is warm and dry. There is pallor.  Psychiatric:  Tearful at times; lacks insight into her medication overuse issue; poor judgment right now    Labs reviewed: Basic Metabolic Panel:  Recent Labs  09/19/14 0619  04/20/15 1325  06/18/15 0726  06/25/15 0656 07/01/15 2111 07/02/15 0544  NA 137  < > 137  < > 137  < > 137 136 141  K 2.7*  < > 3.7  < > 3.7  < > 4.1 5.4* 3.6  CL 92*  < > 99*  < > 95*  < > 101 101 106  CO2 33*  < > 29  < > 28  < > 24 23 25   GLUCOSE 150*  < > 159*  < > 107*  < > 124* 90 93  BUN 10  < > 15  < > 28*  < > 25* 15 14  CREATININE 0.77  < > 1.08*  < > 1.29*  < > 0.91 1.18* 1.16*  CALCIUM 9.6  < > 9.2  < > 9.6  < > 9.3 9.1 8.9  MG 1.7  --   --   --  2.0  --   --   --   --   TSH  --   --  0.890  --   --   --   --   --   --   < > = values in this interval not displayed. Liver Function Tests:  Recent Labs  06/15/15 1331 07/01/15 2111 07/02/15 0544  AST 33 53* 21  ALT <5* 11* 10*  ALKPHOS 107 109 99  BILITOT 0.8 1.4* 0.3  PROT 9.0* 6.1* 5.4*  ALBUMIN 4.8 3.1* 2.6*   No results for input(s): LIPASE, AMYLASE in the last 8760 hours. No results for input(s): AMMONIA in the last 8760 hours. CBC:  Recent Labs  06/23/15 0645  06/25/15 0656 07/01/15 2111 07/02/15 0544  WBC 6.5 8.6 10.4 7.7  NEUTROABS 2.3 4.1 7.0  --   HGB 12.4  13.0 12.3 10.9*  HCT 40.0 40.0 36.6 33.9*  MCV 97.1 94.3 95.1 96.6  PLT 321 326 371 253   Lipid Panel:  Recent Labs  11/11/14 1135 06/18/15 0726 07/02/15 0544  CHOL 222* 218* 117  HDL 39* 40* 37*  LDLCALC 110* 137* 50  TRIG 364* 206* 152*  CHOLHDL 5.7* 5.5 3.2   Lab Results  Component Value Date   HGBA1C 6.2* 07/02/2015    Procedures since last visit: Dg Chest 2 View  07/01/2015  CLINICAL DATA:  Altered mental status for 1 week. Initial encounter. EXAM: CHEST  2 VIEW COMPARISON:  PA and lateral chest 10/11/2011.  CT chest 09/17/2014. FINDINGS: Elevation of the right hemidiaphragm is unchanged. Lungs are clear. There is cardiomegaly. No pneumothorax or pleural effusion. IMPRESSION: Cardiomegaly without acute disease. Electronically Signed   By: Inge Rise M.D.   On: 07/01/2015 18:19   Ct Head Wo Contrast  07/01/2015  CLINICAL DATA:  Increasing falls, hallucinations, and altered mental status. EXAM: CT HEAD WITHOUT CONTRAST CT CERVICAL SPINE WITHOUT CONTRAST TECHNIQUE: Multidetector CT imaging of the head and cervical spine was performed following the standard protocol without intravenous contrast. Multiplanar CT image reconstructions of the cervical spine were also generated. COMPARISON:  11/05/2014 FINDINGS: CT HEAD FINDINGS Mild cerebral atrophy. No ventricular dilatation. Patchy low-attenuation changes in the deep white matter consistent with small vessel ischemia. No mass effect or midline shift. No abnormal extra-axial fluid collections. Gray-white matter junctions are distinct. Basal cisterns are not effaced. No evidence of acute intracranial hemorrhage. No depressed skull fractures. Mild mucosal thickening in the paranasal sinuses. No acute air-fluid levels. Mastoid air cells are not opacified. Vascular calcifications. CT CERVICAL SPINE FINDINGS Postoperative changes  with anterior plate and screw fixation and intervertebral fusion from C5 through C7. There is discontinuity of the fixation plate between C5 and C6 but this appearance is unchanged since previous study. Bony fusion appears intact. Normal alignment of the cervical spine. No vertebral compression deformities. No prevertebral soft tissue swelling. C1-2 articulation appears intact. Degenerative changes throughout the facet joints. No focal bone lesion or bone destruction. The upper esophagus is mildly distended and contains gas and presumably ingested material. Dysmotility or distal esophageal stricture not excluded. IMPRESSION: No acute intracranial abnormalities. Anterior plate and screw fixation with intervertebral fusion from C5 through C7. Fracture of the hardware plate from [id number], unchanged since previous studies. Degenerative changes in the cervical spine. No acute displaced fractures identified. Incidental note of mild dilatation and residual material in the upper esophagus. Esophageal dysmotility or distal stricture not excluded. Electronically Signed   By: Lucienne Capers M.D.   On: 07/01/2015 18:46   Ct Cervical Spine Wo Contrast  07/01/2015  CLINICAL DATA:  Increasing falls, hallucinations, and altered mental status. EXAM: CT HEAD WITHOUT CONTRAST CT CERVICAL SPINE WITHOUT CONTRAST TECHNIQUE: Multidetector CT imaging of the head and cervical spine was performed following the standard protocol without intravenous contrast. Multiplanar CT image reconstructions of the cervical spine were also generated. COMPARISON:  11/05/2014 FINDINGS: CT HEAD FINDINGS Mild cerebral atrophy. No ventricular dilatation. Patchy low-attenuation changes in the deep white matter consistent with small vessel ischemia. No mass effect or midline shift. No abnormal extra-axial fluid collections. Gray-white matter junctions are distinct. Basal cisterns are not effaced. No evidence of acute intracranial hemorrhage. No depressed skull  fractures. Mild mucosal thickening in the paranasal sinuses. No acute air-fluid levels. Mastoid air cells are not opacified. Vascular calcifications. CT CERVICAL SPINE FINDINGS Postoperative changes with anterior plate  and screw fixation and intervertebral fusion from C5 through C7. There is discontinuity of the fixation plate between C5 and C6 but this appearance is unchanged since previous study. Bony fusion appears intact. Normal alignment of the cervical spine. No vertebral compression deformities. No prevertebral soft tissue swelling. C1-2 articulation appears intact. Degenerative changes throughout the facet joints. No focal bone lesion or bone destruction. The upper esophagus is mildly distended and contains gas and presumably ingested material. Dysmotility or distal esophageal stricture not excluded. IMPRESSION: No acute intracranial abnormalities. Anterior plate and screw fixation with intervertebral fusion from C5 through C7. Fracture of the hardware plate from [id number], unchanged since previous studies. Degenerative changes in the cervical spine. No acute displaced fractures identified. Incidental note of mild dilatation and residual material in the upper esophagus. Esophageal dysmotility or distal stricture not excluded. Electronically Signed   By: Lucienne Capers M.D.   On: 07/01/2015 18:46   Mr Brain Wo Contrast  07/02/2015  CLINICAL DATA:  Initial evaluation for acute encephalopathy. EXAM: MRI HEAD WITHOUT CONTRAST TECHNIQUE: Multiplanar, multiecho pulse sequences of the brain and surrounding structures were obtained without intravenous contrast. COMPARISON:  Prior CT from 07/01/2015. FINDINGS: Mild diffuse prominence of the CSF containing spaces is compatible with generalized age-related cerebral atrophy. Scattered patchy T2/FLAIR hyperintensity within the periventricular and deep white matter most consistent with chronic small vessel ischemic disease, mild for age. Minimal small vessel type changes  present within the pons. No abnormal foci of restricted diffusion to suggest acute intracranial infarct. Gray-white matter differentiation maintained. Normal intravascular flow voids preserved. No acute or chronic intracranial hemorrhage. No areas of chronic infarction. No mass lesion, midline shift, or mass effect. No hydrocephalus. No extra-axial fluid collection. Craniocervical junction within normal limits. Visualized upper cervical spine unremarkable. Pituitary gland normal.  No acute abnormality about the orbits. Mild mucosal thickening within the paranasal sinuses. No air-fluid levels to suggest active sinus infection. Trace opacity within the mastoid air cells. Inner ear structures grossly normal. Bone marrow signal intensity within normal limits. No scalp soft tissue abnormality. IMPRESSION: 1. No acute intracranial infarct or other process identified. 2. Mild age-related cerebral atrophy with chronic small vessel ischemic disease. Electronically Signed   By: Jeannine Boga M.D.   On: 07/02/2015 02:22   Assessment/Plan 1. Severe episode of recurrent major depressive disorder, with psychotic features ([place]) -she was recently in behavioral health this last admission for severe depression and acute delirium vs. Acute psychosis related to her depression (suspect this was more delirium from overmedication) -pt was taking benadryl, trazodone, tramadol and high dose neurontin -during the last behavior admission, she was put back on cymbalta in place of the zoloft Dr. Geoffry Paradise and myself had initiated when I saw her in July--she has not been taking that anyway, she says, and has been back on zoloft for some reason so will continue this -remains off anxiolytics and this seems like a safer bet at this point - sertraline (ZOLOFT) 50 MG tablet; Take 2 tablets (100 mg total) by mouth daily.  Dispense: 60 tablet; Refill: 3 -must f/u with Dr. Geoffry Paradise 07/12/15 as planned--will try to get him a copy of this note and  updated med list  2. Spinal stenosis, lumbar region, with neurogenic claudication -she does not tolerate tramadol due to confusion, lethargy, overuse issues -d/c tramadol and will add to intolerances -use ice, topical otc agents and tylenol until she gets these meds out of her system--she is dependent on some degree on opioid and also on  her gabapentin at a dose too high for her renal function--this was lowered to 400mg  at the hospital and I kept it there and explained to both pt and her son the rationale for this - acetaminophen (TYLENOL) 500 MG tablet; Take 2 tablets (1,000 mg total) by mouth 3 (three) times daily.  Dispense: 90 tablet; Refill: 0  3. Essential hypertension -at goal today with her meds, but sometimes skyrockets -is not taking the doses of lisinopril and potassium she was discharged on but the doses prescribed several months ago so with great efforts by staff over the course of the day, we got her on the most recent doses and her son picked them up to fill her pillbox for her and remove all extra pills from her home -prn clonidine continued  - CBC with Differential - Basic metabolic panel -might consider low dose butrans patch for her to decrease pills available and help her pain; also will avoid renal issue  4. Hyperlipidemia -continues on lipitor for this -1/28 lipid panel was good with this with LDL of 50  5. 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 Jannifer Franklin for this and was taking too much gabapentin since her renal function has worsened--must not take more than the 400mg  as currently ordered -resume vitamin B6 which was not in her medication bag  6. Frequent falls -due to sedating meds, plus weakness from never doing her therapy, neuropathy, recent back surgery and ankle surgery -polypharmacy issue was primarily addressed today and I spent over an hour with the patient and her son getting her med list fixed and  counseling her about the reasons for the med changes  7. History of drug overdose -advised her son to remove all meds she is not taking at present from the home and to fill her pillbox leaving only the pillbox available to her and prn meds in limited supply  Labs/tests ordered:   Orders Placed This Encounter  Procedures  . CBC with Differential  . Basic metabolic panel    Next appt:  2 wks for f/u on medication changes, polypharmacy, depression, bp  Kem Parcher L. Fauna Neuner, D.O. [facility] [address], [place] [address] Cell Phone (Mon-Fri 8am-5pm):  [phone] On Call:  [phone] & follow prompts after 5pm & weekends Office Phone:  [phone] Office Fax:  [phone]

## 2015-07-08 LAB — CBC WITH DIFFERENTIAL/PLATELET
Basophils Absolute: 0 10*3/uL (ref 0.0–0.2)
Basos: 1 %
EOS (ABSOLUTE): 0.3 10*3/uL (ref 0.0–0.4)
Eos: 4 %
Hematocrit: 35.5 % (ref 34.0–46.6)
Hemoglobin: 11.2 g/dL (ref 11.1–15.9)
Immature Grans (Abs): 0 10*3/uL (ref 0.0–0.1)
Immature Granulocytes: 0 %
Lymphocytes Absolute: 3.2 10*3/uL — ABNORMAL HIGH (ref 0.7–3.1)
Lymphs: 39 %
MCH: 30.2 pg (ref 26.6–33.0)
MCHC: 31.5 g/dL (ref 31.5–35.7)
MCV: 96 fL (ref 79–97)
Monocytes Absolute: 0.6 10*3/uL (ref 0.1–0.9)
Monocytes: 7 %
Neutrophils Absolute: 4.2 10*3/uL (ref 1.4–7.0)
Neutrophils: 49 %
Platelets: 216 10*3/uL (ref 150–379)
RBC: 3.71 x10E6/uL — ABNORMAL LOW (ref 3.77–5.28)
RDW: 14.7 % (ref 12.3–15.4)
WBC: 8.3 10*3/uL (ref 3.4–10.8)

## 2015-07-08 LAB — BASIC METABOLIC PANEL
BUN/Creatinine Ratio: 16 (ref 11–26)
BUN: 18 mg/dL (ref 8–27)
CO2: 23 mmol/L (ref 18–29)
Calcium: 9.4 mg/dL (ref 8.7–10.3)
Chloride: 100 mmol/L (ref 96–106)
Creatinine, Ser: 1.1 mg/dL — ABNORMAL HIGH (ref 0.57–1.00)
GFR calc Af Amer: 58 mL/min/{1.73_m2} — ABNORMAL LOW (ref >59)
GFR calc non Af Amer: 50 mL/min/{1.73_m2} — ABNORMAL LOW (ref >59)
Glucose: 102 mg/dL — ABNORMAL HIGH (ref 65–99)
Potassium: 4.1 mmol/L (ref 3.5–5.2)
Sodium: 141 mmol/L (ref 134–144)

## 2015-07-10 ENCOUNTER — Telehealth: Payer: Self-pay | Admitting: Internal Medicine

## 2015-07-10 DIAGNOSIS — M25571 Pain in right ankle and joints of right foot: Secondary | ICD-10-CM

## 2015-07-10 DIAGNOSIS — M5441 Lumbago with sciatica, right side: Secondary | ICD-10-CM

## 2015-07-10 DIAGNOSIS — F333 Major depressive disorder, recurrent, severe with psychotic symptoms: Secondary | ICD-10-CM

## 2015-07-10 DIAGNOSIS — Z79899 Other long term (current) drug therapy: Secondary | ICD-10-CM

## 2015-07-10 NOTE — Telephone Encounter (Signed)
Pt called Granville Lewis, PA, yesterday requesting pain medication due to pain in her right ankle and lower back.  She called back again this am with the same request.  She reports that she spoke with a nurse practitioner on Friday night and was told to take advil pm b/c she could not calm down.  I explained that we cannot prescribed controlled substances over the weekend.  She asked about increasing her neurontin again, and I denied this due to the confusion and falls it is causing with her poor renal function and lack of intake.  She continued to request a stronger med.  She reported that she's tried everything including the scheduled tylenol I ordered, the advil and advil pm per the NP and topical, heat and ice.  I called her son, Konrad Dolores.  He reports that she probably hasn't done any of that and more than likely isn't even out of bed.  He said she had a headache yesterday after not eating all day so he made her a sandwich.  He's filled her pillboxes and the only med on hand is the advil.  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 We discussed trying a butrans patch for her on Monday 18mcg weekly.  This may help her pain and would be hard to abuse it, plus it won't affect her renal function.  For now, use ice on the area at her ankle.  Konrad Dolores and his wife plan to be out of town for 2 wks for an anniversary and retirement trip.  Needs home health coming AT LEAST before they go.

## 2015-07-11 ENCOUNTER — Encounter (HOSPITAL_COMMUNITY): Payer: Self-pay | Admitting: Emergency Medicine

## 2015-07-11 ENCOUNTER — Observation Stay (HOSPITAL_COMMUNITY)
Admission: EM | Admit: 2015-07-11 | Discharge: 2015-07-14 | Disposition: A | Discharge status: Home Health | Payer: Medicare Other | Attending: Internal Medicine | Admitting: Internal Medicine

## 2015-07-11 ENCOUNTER — Telehealth: Payer: Self-pay

## 2015-07-11 ENCOUNTER — Observation Stay (HOSPITAL_COMMUNITY): Payer: Medicare Other

## 2015-07-11 ENCOUNTER — Emergency Department (HOSPITAL_COMMUNITY): Payer: Medicare Other

## 2015-07-11 ENCOUNTER — Encounter: Payer: Medicare Other | Admitting: Physician Assistant

## 2015-07-11 DIAGNOSIS — R0789 Other chest pain: Secondary | ICD-10-CM | POA: Diagnosis not present

## 2015-07-11 DIAGNOSIS — R609 Edema, unspecified: Secondary | ICD-10-CM

## 2015-07-11 DIAGNOSIS — E785 Hyperlipidemia, unspecified: Secondary | ICD-10-CM | POA: Diagnosis not present

## 2015-07-11 DIAGNOSIS — M1990 Unspecified osteoarthritis, unspecified site: Secondary | ICD-10-CM | POA: Diagnosis not present

## 2015-07-11 DIAGNOSIS — I10 Essential (primary) hypertension: Secondary | ICD-10-CM | POA: Diagnosis present

## 2015-07-11 DIAGNOSIS — F329 Major depressive disorder, single episode, unspecified: Secondary | ICD-10-CM | POA: Diagnosis present

## 2015-07-11 DIAGNOSIS — G4733 Obstructive sleep apnea (adult) (pediatric): Secondary | ICD-10-CM | POA: Diagnosis not present

## 2015-07-11 DIAGNOSIS — Z96653 Presence of artificial knee joint, bilateral: Secondary | ICD-10-CM | POA: Insufficient documentation

## 2015-07-11 DIAGNOSIS — R296 Repeated falls: Secondary | ICD-10-CM

## 2015-07-11 DIAGNOSIS — F332 Major depressive disorder, recurrent severe without psychotic features: Secondary | ICD-10-CM | POA: Diagnosis not present

## 2015-07-11 DIAGNOSIS — Z7982 Long term (current) use of aspirin: Secondary | ICD-10-CM | POA: Insufficient documentation

## 2015-07-11 DIAGNOSIS — K219 Gastro-esophageal reflux disease without esophagitis: Secondary | ICD-10-CM | POA: Diagnosis not present

## 2015-07-11 DIAGNOSIS — Z6832 Body mass index (BMI) 32.0-32.9, adult: Secondary | ICD-10-CM | POA: Diagnosis not present

## 2015-07-11 DIAGNOSIS — G8929 Other chronic pain: Secondary | ICD-10-CM | POA: Insufficient documentation

## 2015-07-11 DIAGNOSIS — Z8673 Personal history of transient ischemic attack (TIA), and cerebral infarction without residual deficits: Secondary | ICD-10-CM | POA: Insufficient documentation

## 2015-07-11 DIAGNOSIS — R112 Nausea with vomiting, unspecified: Secondary | ICD-10-CM | POA: Diagnosis not present

## 2015-07-11 DIAGNOSIS — I129 Hypertensive chronic kidney disease with stage 1 through stage 4 chronic kidney disease, or unspecified chronic kidney disease: Secondary | ICD-10-CM | POA: Diagnosis not present

## 2015-07-11 DIAGNOSIS — G629 Polyneuropathy, unspecified: Secondary | ICD-10-CM | POA: Diagnosis not present

## 2015-07-11 DIAGNOSIS — Z79899 Other long term (current) drug therapy: Secondary | ICD-10-CM | POA: Diagnosis not present

## 2015-07-11 DIAGNOSIS — I209 Angina pectoris, unspecified: Secondary | ICD-10-CM

## 2015-07-11 DIAGNOSIS — R42 Dizziness and giddiness: Secondary | ICD-10-CM | POA: Insufficient documentation

## 2015-07-11 DIAGNOSIS — R51 Headache: Secondary | ICD-10-CM | POA: Diagnosis not present

## 2015-07-11 DIAGNOSIS — Z9189 Other specified personal risk factors, not elsewhere classified: Secondary | ICD-10-CM

## 2015-07-11 DIAGNOSIS — R079 Chest pain, unspecified: Principal | ICD-10-CM | POA: Insufficient documentation

## 2015-07-11 DIAGNOSIS — G4700 Insomnia, unspecified: Secondary | ICD-10-CM | POA: Diagnosis not present

## 2015-07-11 DIAGNOSIS — F32A Depression, unspecified: Secondary | ICD-10-CM | POA: Diagnosis present

## 2015-07-11 DIAGNOSIS — M48062 Spinal stenosis, lumbar region with neurogenic claudication: Secondary | ICD-10-CM

## 2015-07-11 DIAGNOSIS — F419 Anxiety disorder, unspecified: Secondary | ICD-10-CM | POA: Diagnosis not present

## 2015-07-11 DIAGNOSIS — G459 Transient cerebral ischemic attack, unspecified: Secondary | ICD-10-CM | POA: Diagnosis present

## 2015-07-11 DIAGNOSIS — G2581 Restless legs syndrome: Secondary | ICD-10-CM | POA: Diagnosis not present

## 2015-07-11 DIAGNOSIS — M549 Dorsalgia, unspecified: Secondary | ICD-10-CM | POA: Diagnosis not present

## 2015-07-11 DIAGNOSIS — S82451D Displaced comminuted fracture of shaft of right fibula, subsequent encounter for closed fracture with routine healing: Secondary | ICD-10-CM | POA: Diagnosis not present

## 2015-07-11 DIAGNOSIS — M48061 Spinal stenosis, lumbar region without neurogenic claudication: Secondary | ICD-10-CM

## 2015-07-11 DIAGNOSIS — N644 Mastodynia: Secondary | ICD-10-CM | POA: Diagnosis not present

## 2015-07-11 DIAGNOSIS — R7989 Other specified abnormal findings of blood chemistry: Secondary | ICD-10-CM | POA: Diagnosis not present

## 2015-07-11 DIAGNOSIS — N179 Acute kidney failure, unspecified: Secondary | ICD-10-CM | POA: Diagnosis not present

## 2015-07-11 DIAGNOSIS — N183 Chronic kidney disease, stage 3 (moderate): Secondary | ICD-10-CM | POA: Diagnosis not present

## 2015-07-11 LAB — COMPREHENSIVE METABOLIC PANEL
ALBUMIN: 3.6 g/dL (ref 3.5–5.0)
ALK PHOS: 92 U/L (ref 38–126)
ALT: 10 U/L — ABNORMAL LOW (ref 14–54)
ANION GAP: 18 — AB (ref 5–15)
AST: 24 U/L (ref 15–41)
BUN: 13 mg/dL (ref 6–20)
CALCIUM: 9.4 mg/dL (ref 8.9–10.3)
CHLORIDE: 102 mmol/L (ref 101–111)
CO2: 18 mmol/L — AB (ref 22–32)
Creatinine, Ser: 1.08 mg/dL — ABNORMAL HIGH (ref 0.44–1.00)
GFR calc Af Amer: 58 mL/min — ABNORMAL LOW (ref >60)
GFR calc non Af Amer: 50 mL/min — ABNORMAL LOW (ref >60)
GLUCOSE: 145 mg/dL — AB (ref 65–99)
POTASSIUM: 3.4 mmol/L — AB (ref 3.5–5.1)
SODIUM: 138 mmol/L (ref 135–145)
Total Bilirubin: 1 mg/dL (ref 0.3–1.2)
Total Protein: 6.8 g/dL (ref 6.5–8.1)

## 2015-07-11 LAB — CBC WITH DIFFERENTIAL/PLATELET
BASOS PCT: 0 %
Basophils Absolute: 0 10*3/uL (ref 0.0–0.1)
EOS ABS: 0.1 10*3/uL (ref 0.0–0.7)
Eosinophils Relative: 1 %
HCT: 41.7 % (ref 36.0–46.0)
HEMOGLOBIN: 13.5 g/dL (ref 12.0–15.0)
Lymphocytes Relative: 32 %
Lymphs Abs: 3.8 10*3/uL (ref 0.7–4.0)
MCH: 30.5 pg (ref 26.0–34.0)
MCHC: 32.4 g/dL (ref 30.0–36.0)
MCV: 94.3 fL (ref 78.0–100.0)
MONOS PCT: 8 %
Monocytes Absolute: 1 10*3/uL (ref 0.1–1.0)
NEUTROS PCT: 59 %
Neutro Abs: 6.9 10*3/uL (ref 1.7–7.7)
Platelets: 355 10*3/uL (ref 150–400)
RBC: 4.42 MIL/uL (ref 3.87–5.11)
RDW: 14.2 % (ref 11.5–15.5)
WBC: 11.8 10*3/uL — AB (ref 4.0–10.5)

## 2015-07-11 LAB — D-DIMER, QUANTITATIVE: D-Dimer, Quant: 1.31 ug{FEU}/mL — ABNORMAL HIGH (ref 0.00–0.50)

## 2015-07-11 LAB — TROPONIN I
Troponin I: <0.03 ng/mL (ref <0.031)
Troponin I: <0.03 ng/mL (ref <0.031)
Troponin I: <0.03 ng/mL (ref <0.031)

## 2015-07-11 LAB — LIPASE, BLOOD: Lipase: 23 U/L (ref 11–51)

## 2015-07-11 MED ORDER — GABAPENTIN 400 MG PO CAPS
400.0000 mg | ORAL_CAPSULE | Freq: Four times a day (QID) | ORAL | Status: DC
  Administered 2015-07-11 – 2015-07-14 (×14): 400 mg via ORAL
  Filled 2015-07-11 (×14): qty 1

## 2015-07-11 MED ORDER — LISINOPRIL 5 MG PO TABS
5.0000 mg | ORAL_TABLET | Freq: Every day | ORAL | Status: DC
  Filled 2015-07-11: qty 1

## 2015-07-11 MED ORDER — MORPHINE SULFATE (PF) 2 MG/ML IV SOLN
2.0000 mg | Freq: Once | INTRAVENOUS | Status: AC
  Administered 2015-07-11: 2 mg via INTRAVENOUS
  Filled 2015-07-11: qty 1

## 2015-07-11 MED ORDER — SENNA 8.6 MG PO TABS
1.0000 | ORAL_TABLET | Freq: Two times a day (BID) | ORAL | Status: DC
  Administered 2015-07-11 – 2015-07-13 (×4): 8.6 mg via ORAL
  Filled 2015-07-11 (×7): qty 1

## 2015-07-11 MED ORDER — SALINE SPRAY 0.65 % NA SOLN
1.0000 | NASAL | Status: DC | PRN
  Filled 2015-07-11: qty 44

## 2015-07-11 MED ORDER — TECHNETIUM TO 99M ALBUMIN AGGREGATED
4.1000 | Freq: Once | INTRAVENOUS | Status: AC | PRN
  Administered 2015-07-11: 4 via INTRAVENOUS

## 2015-07-11 MED ORDER — ACETAMINOPHEN 650 MG RE SUPP: 650.0000 mg | Freq: Four times a day (QID) | RECTAL | Status: DC | PRN

## 2015-07-11 MED ORDER — SODIUM CHLORIDE 0.9% FLUSH
3.0000 mL | Freq: Two times a day (BID) | INTRAVENOUS | Status: DC
  Administered 2015-07-11 – 2015-07-13 (×4): 3 mL via INTRAVENOUS

## 2015-07-11 MED ORDER — TRAMADOL HCL 50 MG PO TABS
50.0000 mg | ORAL_TABLET | Freq: Four times a day (QID) | ORAL | Status: DC | PRN
  Administered 2015-07-12 – 2015-07-13 (×3): 50 mg via ORAL
  Filled 2015-07-11 (×3): qty 1

## 2015-07-11 MED ORDER — ASPIRIN 325 MG PO TABS
325.0000 mg | ORAL_TABLET | Freq: Every day | ORAL | Status: DC
  Administered 2015-07-12 – 2015-07-14 (×3): 325 mg via ORAL
  Filled 2015-07-11 (×3): qty 1

## 2015-07-11 MED ORDER — ATORVASTATIN CALCIUM 40 MG PO TABS
40.0000 mg | ORAL_TABLET | Freq: Every day | ORAL | Status: DC
  Administered 2015-07-11 – 2015-07-13 (×3): 40 mg via ORAL
  Filled 2015-07-11 (×3): qty 1

## 2015-07-11 MED ORDER — DOCUSATE SODIUM 100 MG PO CAPS
100.0000 mg | ORAL_CAPSULE | Freq: Two times a day (BID) | ORAL | Status: DC
  Administered 2015-07-11 – 2015-07-13 (×5): 100 mg via ORAL
  Filled 2015-07-11 (×6): qty 1

## 2015-07-11 MED ORDER — OXYCODONE HCL 5 MG PO TABS
5.0000 mg | ORAL_TABLET | ORAL | Status: DC | PRN
  Administered 2015-07-11 – 2015-07-14 (×11): 5 mg via ORAL
  Filled 2015-07-11 (×11): qty 1

## 2015-07-11 MED ORDER — SODIUM CHLORIDE 0.9 % IV SOLN: 250.0000 mL | INTRAVENOUS | Status: DC | PRN

## 2015-07-11 MED ORDER — POTASSIUM CHLORIDE CRYS ER 10 MEQ PO TBCR
10.0000 meq | EXTENDED_RELEASE_TABLET | Freq: Every day | ORAL | Status: DC
  Administered 2015-07-11 – 2015-07-12 (×2): 10 meq via ORAL
  Filled 2015-07-11 (×2): qty 1

## 2015-07-11 MED ORDER — SERTRALINE HCL 100 MG PO TABS
100.0000 mg | ORAL_TABLET | Freq: Every day | ORAL | Status: DC
  Administered 2015-07-11 – 2015-07-14 (×4): 100 mg via ORAL
  Filled 2015-07-11 (×2): qty 1
  Filled 2015-07-11: qty 2
  Filled 2015-07-11: qty 1

## 2015-07-11 MED ORDER — ONDANSETRON HCL 4 MG PO TABS: 4.0000 mg | ORAL_TABLET | Freq: Four times a day (QID) | ORAL | Status: DC | PRN

## 2015-07-11 MED ORDER — SODIUM CHLORIDE 0.9% FLUSH: 3.0000 mL | INTRAVENOUS | Status: DC | PRN

## 2015-07-11 MED ORDER — LISINOPRIL 10 MG PO TABS
5.0000 mg | ORAL_TABLET | Freq: Once | ORAL | Status: AC
  Administered 2015-07-11: 5 mg via ORAL
  Filled 2015-07-11: qty 1

## 2015-07-11 MED ORDER — ACETAMINOPHEN 325 MG PO TABS
650.0000 mg | ORAL_TABLET | Freq: Four times a day (QID) | ORAL | Status: DC | PRN
  Administered 2015-07-12 – 2015-07-13 (×3): 650 mg via ORAL
  Filled 2015-07-11 (×3): qty 2

## 2015-07-11 MED ORDER — TECHNETIUM TC 99M DIETHYLENETRIAME-PENTAACETIC ACID: 30.1000 | Freq: Once | INTRAVENOUS | Status: DC | PRN

## 2015-07-11 MED ORDER — METOCLOPRAMIDE HCL 5 MG/ML IJ SOLN
5.0000 mg | Freq: Once | INTRAMUSCULAR | Status: AC
  Administered 2015-07-11: 5 mg via INTRAVENOUS
  Filled 2015-07-11: qty 2

## 2015-07-11 MED ORDER — CLONIDINE HCL 0.1 MG PO TABS
0.1000 mg | ORAL_TABLET | Freq: Every day | ORAL | Status: DC
  Administered 2015-07-14: 0.1 mg via ORAL
  Filled 2015-07-11 (×2): qty 1

## 2015-07-11 MED ORDER — ONDANSETRON HCL 4 MG/2ML IJ SOLN
4.0000 mg | Freq: Four times a day (QID) | INTRAMUSCULAR | Status: DC | PRN
  Administered 2015-07-13: 4 mg via INTRAVENOUS
  Filled 2015-07-11: qty 2

## 2015-07-11 MED ORDER — ENOXAPARIN SODIUM 40 MG/0.4ML ~~LOC~~ SOLN
40.0000 mg | SUBCUTANEOUS | Status: DC
  Administered 2015-07-11 – 2015-07-14 (×3): 40 mg via SUBCUTANEOUS
  Filled 2015-07-11 (×3): qty 0.4

## 2015-07-11 MED ORDER — SUMATRIPTAN SUCCINATE 50 MG PO TABS
50.0000 mg | ORAL_TABLET | Freq: Once | ORAL | Status: DC
  Filled 2015-07-11: qty 1

## 2015-07-11 MED ORDER — ALUM & MAG HYDROXIDE-SIMETH 200-200-20 MG/5ML PO SUSP: 30.0000 mL | Freq: Four times a day (QID) | ORAL | Status: DC | PRN

## 2015-07-11 MED ORDER — CLONIDINE HCL 0.1 MG PO TABS
0.1000 mg | ORAL_TABLET | Freq: Once | ORAL | Status: AC
  Administered 2015-07-11: 0.1 mg via ORAL
  Filled 2015-07-11: qty 1

## 2015-07-11 MED ORDER — MORPHINE SULFATE (PF) 2 MG/ML IV SOLN
2.0000 mg | INTRAVENOUS | Status: DC | PRN
  Administered 2015-07-11 – 2015-07-13 (×3): 2 mg via INTRAVENOUS
  Filled 2015-07-11 (×3): qty 1

## 2015-07-11 NOTE — ED Provider Notes (Signed)
CSN: [id number]     Arrival date & time 07/11/15  [id number] History   First MD Initiated Contact with Patient 07/11/15 0440     Chief Complaint  Patient presents with  . Chest Pain     (Consider location/radiation/quality/duration/timing/severity/associated sxs/prior Treatment) HPI Comments: Patient presents to the ER for evaluation of chest pain. Patient reports that pain began overnight. She describes a heaviness like something sitting on her chest. Patient does not report a previous history of coronary artery disease. Patient comes to the ER by ambulance from home. She was given aspirin and nitroglycerin. She reports improvement but not complete resolution after nitroglycerin.  Patient is a 75 y.o. female presenting with chest pain.  Chest Pain   Past Medical History  Diagnosis Date  . GERD (gastroesophageal reflux disease)     hx pud '98  . Arthritis     djd  . Restless leg syndrome     takes Sinemet daily  . 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 Dysrhythmia     hx tachy palpitations  . Neuromuscular disorder ([place])     peripheral neuropathy, FEET AND LEGS  . Stroke [facility])     ? 6 months ago - tests okay - [facility]   . Cellulitis     10/11/11 hospitalized for cellulitis   . Hyperlipemia     takes Atorvastatin daily  . Peripheral neuropathy ([place])   . Elbow fracture, left April 2015  . Depression     takes Zoloft daily  . Vertigo     takes Meclizine daily as needed  . Insomnia     takes restoril nightly as needed  . Hypertension     takes HCTZ daily  . Peripheral neuropathy ([facility])     takes Gabapentin daily  . History of bronchitis     many yrs ago  . Headache(784.0)     migraines yrs ago-takes Imitrex daily  . Joint pain   . Joint swelling   . Chronic back pain     scoliosis/stenosis  . Anxiety     takes Ativan daily as needed  . Cataracts, bilateral     immature   . Depression    Past Surgical History  Procedure Laterality Date  . Cervical disc  surgery x2  2011  . Rt carotid enarterectomy  2011  . Rt knee arthroscopy  '99  . Left knee arthroscopy  2001  . Hematoma evacuation  2011    s/p rt cea  . Joint replacement  '01    total knee replacement, LEFT  . Abdominal hysterectomy  1975    bil oophorectomy  . Back surgery   MANY YRS AGO    laminectomy x3  . Cholecystectomy  1993  . Total knee arthroplasty  09/13/2011    Procedure: TOTAL KNEE ARTHROPLASTY;  Surgeon: Magnus Sinning, MD;  Location: [facility];  Service: Orthopedics;  Laterality: Right;  . Right knee replacement      09/2011   . Carpal tunnel release  07/09/2012    Procedure: CARPAL TUNNEL RELEASE;  Surgeon: Magnus Sinning, MD;  Location: [facility];  Service: Orthopedics;  Laterality: Left;  . Finger arthroplasty  07/09/2012    Procedure: FINGER ARTHROPLASTY;  Surgeon: Magnus Sinning, MD;  Location: [facility];  Service: Orthopedics;  Laterality: Left;  Interposition Arthroplasty [facility] Joint Thumb Left   . Lipoma excision  07/09/2012    Procedure: EXCISION LIPOMA;  Surgeon: Laurice Record  Aplington, MD;  Location: [facility];  Service: Orthopedics;  Laterality: Left;  Excision Lipoma Dorsum Left Wrist   . Orif ankle fracture Right 09/14/2014    FIBULA   . Orif ankle fracture Right 09/14/2014    Procedure: OPEN REDUCTION INTERNAL FIXATION (ORIF) RIGHT ANKLE FRACTURE/SYNDESMOSIS ;  Surgeon: Wylene Simmer, MD;  Location: [facility];  Service: Orthopedics;  Laterality: Right;  . Colonosocpy    . Esophagogastroduodenoscopy    . Lumbar laminectomy with coflex 1 level Bilateral 04/19/2015    Procedure: LUMBAR TWO-THREE LUMBAR LAMINECTOMY WITH COFLEX ;  Surgeon: Kristeen Miss, MD;  Location: [facility];  Service: Neurosurgery;  Laterality: Bilateral;  Bilateral L23 laminectomy and foraminotomy with coflex   Family History  Problem Relation Age of Onset  . Congestive Heart Failure Father   . Congestive Heart Failure Sister   . Alzheimer's disease Mother   . Diabetes Brother   . Arthritis Brother     Social History  Substance Use Topics  . Smoking status: Never Smoker   . Smokeless tobacco: Never Used  . Alcohol Use: No   OB History    No data available     Review of Systems  Cardiovascular: Positive for chest pain.  All other systems reviewed and are negative.     Allergies  Contrast media; Acyclovir and related; and Sulfa drugs cross reactors  Home Medications   Prior to Admission medications   Medication Sig Start Date End Date Taking? Authorizing Provider  acetaminophen (TYLENOL) 500 MG tablet Take 2 tablets (1,000 mg total) by mouth 3 (three) times daily. 07/07/15  Yes [name] L [name], DO  aspirin 325 MG tablet Take 1 tablet (325 mg total) by mouth daily. For heart health 06/28/15  Yes Encarnacion Slates, NP  atorvastatin (LIPITOR) 40 MG tablet Take one tablet by mouth once daily for cholesterol 06/28/15  Yes Encarnacion Slates, NP  cholecalciferol (VITAMIN D) 1000 units tablet Take 1 tablet (1,000 Units total) by mouth daily. For bone health 06/28/15  Yes Encarnacion Slates, NP  cloNIDine (CATAPRES) 0.1 MG tablet Take if SBP is greater than 160 07/07/15  Yes [name] L [name], DO  docusate sodium (COLACE) 100 MG capsule Take 1 capsule (100 mg total) by mouth daily. For constipation 06/28/15  Yes Encarnacion Slates, NP  gabapentin (NEURONTIN) 400 MG capsule Take 1 capsule (400 mg total) by mouth 4 (four) times daily. For pain management/agitation 07/07/15  Yes [name] L [name], DO  lisinopril (PRINIVIL,ZESTRIL) 5 MG tablet Take 1 tablet (5 mg total) by mouth daily. For high blood pressure 07/07/15  Yes [name] L [name], DO  potassium chloride (K-DUR,KLOR-CON) 10 MEQ tablet Take 1 tablet (10 mEq total) by mouth daily. For low potassium 07/07/15  Yes [name] L [name], DO  pyridOXINE (VITAMIN B-6) 50 MG tablet Take 1 tablet (50 mg total) by mouth daily. For Vitamin B-6 supplement 06/28/15  Yes Encarnacion Slates, NP  sertraline (ZOLOFT) 50 MG tablet Take 2 tablets (100 mg total) by mouth daily. 07/07/15  Yes [name] L [name],  DO  sodium chloride (OCEAN) 0.65 % SOLN nasal spray Place 1 spray into both nostrils as needed for congestion. 06/28/15  Yes Encarnacion Slates, NP  SUMAtriptan (IMITREX) 50 MG tablet Take 50 mg by mouth as needed for migraine or headache 07/07/15  Yes [name] L [name], DO   BP 184/102 mmHg  Pulse 108  Temp(Src) 98.5 F (36.9 C) (Oral)  Resp 13  SpO2 93% Physical Exam  Constitutional: [name] oriented to person, place, and time. She appears well-developed and well-nourished. No distress.  HENT:  Head: Normocephalic and atraumatic.  Right Ear: Hearing normal.  Left Ear: Hearing normal.  Nose: Nose normal.  Mouth/Throat: Oropharynx is clear and moist and mucous membranes are normal.  Eyes: Conjunctivae and EOM are normal. Pupils are equal, round, and reactive to light.  Neck: Normal range of motion. Neck supple.  Cardiovascular: Regular rhythm, S1 normal and S2 normal.  Exam reveals no gallop and no friction rub.   No murmur heard. Pulmonary/Chest: Effort normal and breath sounds normal. No respiratory distress. She exhibits no tenderness.  Abdominal: Soft. Normal appearance and bowel sounds are normal. There is no hepatosplenomegaly. There is no tenderness. There is no rebound, no guarding, no tenderness at McBurney's point and negative Murphy's sign. No hernia.  Musculoskeletal: Normal range of motion.  Neurological: [name] alert and oriented to person, place, and time. She has normal strength. No cranial nerve deficit or sensory deficit. Coordination normal. GCS eye subscore is 4. GCS verbal subscore is 5. GCS motor subscore is 6.  Skin: Skin is warm, dry and intact. No rash noted. No cyanosis.  Psychiatric: She has a normal mood and affect. Her speech is normal and behavior is normal. Thought content normal.  Nursing note and vitals reviewed.   ED Course  Procedures (including critical care time) Labs Review Labs Reviewed  CBC WITH DIFFERENTIAL/PLATELET - Abnormal; Notable for the  following:    WBC 11.8 (*)    All other components within normal limits  COMPREHENSIVE METABOLIC PANEL - Abnormal; Notable for the following:    Potassium 3.4 (*)    CO2 18 (*)    Glucose, Bld 145 (*)    Creatinine, Ser 1.08 (*)    ALT 10 (*)    GFR calc non Af Amer 50 (*)    GFR calc Af Amer 58 (*)    Anion gap 18 (*)    All other components within normal limits  LIPASE, BLOOD  TROPONIN I    Imaging Review Dg Chest 2 View  07/11/2015  CLINICAL DATA:  Pain under LEFT breast with nausea and vomiting. EXAM: CHEST  2 VIEW COMPARISON:  Chest radiograph July 01, 2015 FINDINGS: The cardiac silhouette is mildly enlarged, mediastinal silhouette is nonsuspicious. No pleural effusion or focal consolidation. Similar blunting of the RIGHT costophrenic angle compatible with pleural thickening. No pneumothorax. Soft tissue planes and included osseous structures are nonsuspicious. Surgical clips in the included right abdomen compatible with cholecystectomy. IMPRESSION: Mild cardiomegaly, no acute pulmonary process. Electronically Signed   By: Elon Alas M.D.   On: 07/11/2015 05:41   I have personally reviewed and evaluated these images and lab results as part of my medical decision-making.   EKG Interpretation   Date/Time:  Monday July 11 2015 04:44:08 EST Ventricular Rate:  116 PR Interval:  159 QRS Duration: 75 QT Interval:  345 QTC Calculation: 479 R Axis:   43 Text Interpretation:  Sinus tachycardia Low voltage, precordial leads  Borderline T wave abnormalities Baseline wander in lead(s) II III aVL aVF  V1 No significant change since prior Confirmed by Juell Radney  MD, Leno Mathes  [id number]) on 07/11/2015 7:04:15 AM      MDM   Final diagnoses:  None   Chest pain  Presents to the emergency department for evaluation of chest pain. Patient had partial improvement of her chest pain with nitroglycerin. Residual pain was resolved with IV analgesia. Patient's EKG  does not show any  acute ischemia. Troponin is negative. No concern for congestive heart failure. Blood pressure has progressively gone up here in the ER. She also has developed a mild tachycardia. This has coincided with her talking to her son about her ongoing issue with depression and noncompliance with her psychiatric regimen. She does have a significant psychiatric history, but this is not a component of the care that she requires currently. She requires cardiac rule out and can follow up with her psychologist as an outpatient.    Orpah Greek, MD 07/11/15 [phone]

## 2015-07-11 NOTE — Telephone Encounter (Signed)
Dr. Mariea Clonts had 07/07/15 office notes  and medication list faxed to Dr. Oneida Arenas psychiatrist, phone [phone]; fax [phone] . Done.

## 2015-07-11 NOTE — ED Notes (Signed)
Pt stated that she awoke to used the restroom, and developed an acute onset of left sided chest pain. Patient denied and shortness of breath, but stated she became nauseated. Pt received 324 mg ASA, two sublingual nitro pills, and 4mg  of Zofran by EMS enroute to the hospital.

## 2015-07-11 NOTE — H&P (Signed)
[facility] History and Physical  [name] [id number] DOB: [birth year] DOA: 07/11/2015  Referring physician:  PCP: Hollace Kinnier, DO   Chief Complaint: Chest pain  HPI: [name] is a 75 y.o. female  with a history of hypertension, hyperlipidemia, CKD and TIAs, and recently hospitalized for acute toxic encephalopathy due to sedatives and dehydration from 1/27 through 07/04/2015,presenting to the emergency department with chest pain. She awoke due to acute onset of left-sided, "under the breast", sharp chest pain, without nausea or diaphoresis.  Pain was not worsened with deep inspiration, movement or exertion.She denies any dizziness. She denies any prior similar events. She denies any history of CAD. She reports headaches without vision changes. No confusion is reported. She denies any leg swelling or calf pain. EMS was called. She received ASA 324 mg and two sublingual NTG, IV Zofran in rout to the hospital without relief, until MSO4 was given. At the emergency department, her EKG was essentially unremarkable when compared to prior. Her CMET and CBC are  normal. Her troponins are negative to date. Last 2 D Echo on 2013 was normal We will admit for further observation.    Review of Systems  Constitutional: Negative.   Eyes: Negative for blurred vision, double vision and pain.  Respiratory: Negative.   Cardiovascular: Positive for chest pain. Negative for palpitations, orthopnea, claudication, leg swelling and PND.  Gastrointestinal: Positive for heartburn. Negative for nausea, vomiting, abdominal pain, diarrhea and constipation.  Genitourinary: Negative.   Musculoskeletal: Positive for myalgias, back pain and joint pain. Negative for falls and neck pain.       Chronic back pain  Skin: Negative.   Neurological: Positive for headaches. Negative for dizziness, tingling, sensory change, focal weakness, seizures and loss of consciousness.  Endo/Heme/Allergies: Negative for  environmental allergies and polydipsia. Bruises/bleeds easily.  Psychiatric/Behavioral: Positive for depression. Negative for memory loss. The patient is nervous/anxious and has insomnia.       Past Medical History  Diagnosis Date  . GERD (gastroesophageal reflux disease)     hx pud '98  . Arthritis     djd  . Restless leg syndrome     takes Sinemet daily  . 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 Dysrhythmia     hx tachy palpitations  . Neuromuscular disorder ([place])     peripheral neuropathy, FEET AND LEGS  . Stroke [facility])     ? 6 months ago - tests okay - [facility]   . Cellulitis     10/11/11 hospitalized for cellulitis   . Hyperlipemia     takes Atorvastatin daily  . Peripheral neuropathy ([place])   . Elbow fracture, left April 2015  . Depression     takes Zoloft daily  . Vertigo     takes Meclizine daily as needed  . Insomnia     takes restoril nightly as needed  . Hypertension     takes HCTZ daily  . Peripheral neuropathy (HCC)     takes Gabapentin daily  . History of bronchitis     many yrs ago  . Headache(784.0)     migraines yrs ago-takes Imitrex daily  . Joint pain   . Joint swelling   . Chronic back pain     scoliosis/stenosis  . Anxiety     takes Ativan daily as needed  . Cataracts, bilateral     immature   . Depression    Past Surgical History  Procedure Laterality  Date  . Cervical disc surgery x2  2011  . Rt carotid enarterectomy  2011  . Rt knee arthroscopy  '99  . Left knee arthroscopy  2001  . Hematoma evacuation  2011    s/p rt cea  . Joint replacement  '01    total knee replacement, LEFT  . Abdominal hysterectomy  1975    bil oophorectomy  . Back surgery   MANY YRS AGO    laminectomy x3  . Cholecystectomy  1993  . Total knee arthroplasty  09/13/2011    Procedure: TOTAL KNEE ARTHROPLASTY;  Surgeon: Magnus Sinning, MD;  Location: [facility];  Service: Orthopedics;  Laterality: Right;  . Right knee replacement      09/2011   . Carpal  tunnel release  07/09/2012    Procedure: CARPAL TUNNEL RELEASE;  Surgeon: Magnus Sinning, MD;  Location: [facility];  Service: Orthopedics;  Laterality: Left;  . Finger arthroplasty  07/09/2012    Procedure: FINGER ARTHROPLASTY;  Surgeon: Magnus Sinning, MD;  Location: [facility];  Service: Orthopedics;  Laterality: Left;  Interposition Arthroplasty [facility] Joint Thumb Left   . Lipoma excision  07/09/2012    Procedure: EXCISION LIPOMA;  Surgeon: Magnus Sinning, MD;  Location: [facility];  Service: Orthopedics;  Laterality: Left;  Excision Lipoma Dorsum Left Wrist   . Orif ankle fracture Right 09/14/2014    FIBULA   . Orif ankle fracture Right 09/14/2014    Procedure: OPEN REDUCTION INTERNAL FIXATION (ORIF) RIGHT ANKLE FRACTURE/SYNDESMOSIS ;  Surgeon: Wylene Simmer, MD;  Location: [place];  Service: Orthopedics;  Laterality: Right;  . Colonosocpy    . Esophagogastroduodenoscopy    . Lumbar laminectomy with coflex 1 level Bilateral 04/19/2015    Procedure: LUMBAR TWO-THREE LUMBAR LAMINECTOMY WITH COFLEX ;  Surgeon: Kristeen Miss, MD;  Location: [place] [facility];  Service: Neurosurgery;  Laterality: Bilateral;  Bilateral L23 laminectomy and foraminotomy with coflex   Social History:  reports that she has never smoked. She has never used smokeless tobacco. She reports that she does not drink alcohol or use illicit drugs. Retired Marine scientist. Lives alone at home.  Allergies  Allergen Reactions  . Contrast Media [Iodinated Diagnostic Agents] Other (See Comments)    Respiratory failure  . Acyclovir And Related Other (See Comments)    unknown  . Sulfa Drugs Cross Reactors Nausea And Vomiting    Family History  Problem Relation Age of Onset  . Congestive Heart Failure Father   . Congestive Heart Failure Sister   . Alzheimer's disease Mother   . Diabetes Brother   . Arthritis Brother      Prior to Admission medications   Medication Sig Start Date End Date Taking? Authorizing Provider  acetaminophen (TYLENOL) 500 MG  tablet Take 2 tablets (1,000 mg total) by mouth 3 (three) times daily. 07/07/15  Yes [name] L [name], DO  aspirin 325 MG tablet Take 1 tablet (325 mg total) by mouth daily. For heart health 06/28/15  Yes Encarnacion Slates, NP  atorvastatin (LIPITOR) 40 MG tablet Take one tablet by mouth once daily for cholesterol 06/28/15  Yes Encarnacion Slates, NP  cholecalciferol (VITAMIN D) 1000 units tablet Take 1 tablet (1,000 Units total) by mouth daily. For bone health 06/28/15  Yes Encarnacion Slates, NP  cloNIDine (CATAPRES) 0.1 MG tablet Take if SBP is greater than 160 07/07/15  Yes [name] L [name], DO  docusate sodium (COLACE) 100 MG capsule Take 1 capsule (100 mg total) by mouth  daily. For constipation 06/28/15  Yes Encarnacion Slates, NP  gabapentin (NEURONTIN) 400 MG capsule Take 1 capsule (400 mg total) by mouth 4 (four) times daily. For pain management/agitation 07/07/15  Yes [name] L [name], DO  lisinopril (PRINIVIL,ZESTRIL) 5 MG tablet Take 1 tablet (5 mg total) by mouth daily. For high blood pressure 07/07/15  Yes [name] L [name], DO  potassium chloride (K-DUR,KLOR-CON) 10 MEQ tablet Take 1 tablet (10 mEq total) by mouth daily. For low potassium 07/07/15  Yes [name] L [name], DO  pyridOXINE (VITAMIN B-6) 50 MG tablet Take 1 tablet (50 mg total) by mouth daily. For Vitamin B-6 supplement 06/28/15  Yes Encarnacion Slates, NP  sertraline (ZOLOFT) 50 MG tablet Take 2 tablets (100 mg total) by mouth daily. 07/07/15  Yes [name] L [name], DO  sodium chloride (OCEAN) 0.65 % SOLN nasal spray Place 1 spray into both nostrils as needed for congestion. 06/28/15  Yes Encarnacion Slates, NP  SUMAtriptan (IMITREX) 50 MG tablet Take 50 mg by mouth as needed for migraine or headache 07/07/15  Yes [name], DO   Physical Exam: Filed Vitals:   07/11/15 0449 07/11/15 0728 07/11/15 0830 07/11/15 0915  BP: 164/96 184/102 191/77 188/97  Pulse: 113 108 105 107  Temp: 98.5 F (36.9 C)     TempSrc: Oral     Resp:  13 12 12   SpO2: 99% 93% 93% 97%    Wt Readings  from Last 3 Encounters:  07/02/15 92.488 kg (203 lb 14.4 oz)  06/16/15 88.905 kg (196 lb)  04/19/15 92.08 kg (203 lb)    Physical Exam  Constitutional: [name] oriented to person, place, and time and well-developed, well-nourished, and in no distress.  Anxious appearing  HENT:  Head: Normocephalic and atraumatic.  Mouth/Throat: Oropharynx is clear and moist. No oropharyngeal exudate.  Eyes: EOM are normal. No scleral icterus.  Neck: Normal range of motion. Neck supple. No JVD present. No thyromegaly present.  Cardiovascular: Regular rhythm and intact distal pulses.  Exam reveals no gallop and no friction rub.   No murmur heard. Pulmonary/Chest: Effort normal and breath sounds normal. No respiratory distress. She has no wheezes. She has no rales. She exhibits no tenderness.  No reproducible pain when palpation at the left costophrenic area  Abdominal: Soft. Bowel sounds are normal. She exhibits no distension and no mass. There is no tenderness. There is no rebound and no guarding.  Musculoskeletal: She exhibits no edema.  Limited ROM due to chronic pain. Positive chronic spinal tenderness  Lymphadenopathy:    She has no cervical adenopathy.  Neurological: [name] alert and oriented to person, place, and time. She has normal reflexes. She displays normal reflexes. No cranial nerve deficit. She exhibits normal muscle tone. Coordination normal.  Peripheral neuropathy  Skin: Skin is warm and dry. No rash noted. No erythema. No pallor.  Minimal bruising.             Labs on Admission:  Basic Metabolic Panel:  Recent Labs Lab 07/07/15 1044 07/11/15 0511  NA 141 138  K 4.1 3.4*  CL 100 102  CO2 23 18*  GLUCOSE 102* 145*  BUN 18 13  CREATININE 1.10* 1.08*  CALCIUM 9.4 9.4    Liver Function Tests:  Recent Labs Lab 07/11/15 0511  AST 24  ALT 10*  ALKPHOS 92  BILITOT 1.0  PROT 6.8  ALBUMIN 3.6    Recent Labs Lab 07/11/15 0511  LIPASE 23   No results  for input(s):  AMMONIA in the last 168 hours.  CBC:  Recent Labs Lab 07/07/15 1044 07/11/15 0511  WBC 8.3 11.8*  NEUTROABS 4.2 6.9  HGB  --  13.5  HCT 35.5 41.7  MCV 96 94.3  PLT 216 355    Cardiac Enzymes:  Recent Labs Lab 07/11/15 0511  TROPONINI <0.03     Radiological Exams on Admission: Dg Chest 2 View  07/11/2015  CLINICAL DATA:  Pain under LEFT breast with nausea and vomiting. EXAM: CHEST  2 VIEW COMPARISON:  Chest radiograph July 01, 2015 FINDINGS: The cardiac silhouette is mildly enlarged, mediastinal silhouette is nonsuspicious. No pleural effusion or focal consolidation. Similar blunting of the RIGHT costophrenic angle compatible with pleural thickening. No pneumothorax. Soft tissue planes and included osseous structures are nonsuspicious. Surgical clips in the included right abdomen compatible with cholecystectomy. IMPRESSION: Mild cardiomegaly, no acute pulmonary process. Electronically Signed   By: Elon Alas M.D.   On: 07/11/2015 05:41   EKG: NSR, QTC 479Sinus tachycardia Borderline T wave abnormalities No significant change since prior    Assessment/Plan Principal Problem:   Chest pain Active Problems:   TIA (transient ischemic attack)   HTN (hypertension)   Hyperlipemia   Depression   Major depressive disorder, recurrent severe without psychotic features (HCC)   OSA (obstructive sleep apnea)   Back pain   History of drug overdose   Hyperlipidemia   Essential hypertension  Chest pain, No prior cardiac history. Risk factors include HLD, HTN, FH, obesity), Troponin neg.  HEART Score moderately suspicious 5-6.  Differential includes ACS, MSK pain.  Discussed with Cards. No immediate invasive procedures such as cardiac cath is recommended at this time. Will arrange OP follow up prior to her discharge -  Admit toTelemetry -  Cycle troponins and ECG in am. -  A1c & lipid panel -  Daily aspirin -  Continue prn clonidine and high dose statin -  NTG prn chest  pain    Check D Dimer in view of recent admission  -  Maalox prn  Chronic kidney disease stage baseline creatinine 1.2-1.4   Current Cr 1.08 Continue to monitor -Minimize nephrotoxins and renally dose medications  Hypertension Will continue clonidine  Hyperlipidemia Will continue Lipitor and Zocor  History of TIAs. No new events during this hospitalization Continue ASA  Chronic pain/ Peripheral Neuropathy. She received 1 dose of MSO4 94m IV at the ED with some relief of her chronic back pain Will continue her home meds with Tylenol and Neurontin. Tramadol as non narcotic alternative  Headaches. She has a h/o chronic headaches. Patient denies worsening symptoms.   will continue home Imitrex as needed  Code Status: Full CodeFamily Communication:  No family at bedside Disposition Plan: Pending Improvement. Admitted for observation in tele bed. Expected LOS 24 hrs    [facility] E,PA-C [facility] [email or web address] Password [email or web address]

## 2015-07-11 NOTE — ED Notes (Signed)
Pt requesting pain medications at this time. Admitting PA at bedside and aware of pt requests.

## 2015-07-11 NOTE — ED Notes (Signed)
Pt son and pt concerned about pt d/t major depressive disorder for which pt has been admitted for psych treatment 2x since beginning of January. Pt denies SI/HI. Pt c/o feeling "very sad and thinks this is related to adjustments in medications for BP, pain, and psych issues."

## 2015-07-12 ENCOUNTER — Telehealth: Payer: Self-pay | Admitting: *Deleted

## 2015-07-12 ENCOUNTER — Observation Stay (HOSPITAL_BASED_OUTPATIENT_CLINIC_OR_DEPARTMENT_OTHER): Payer: Medicare Other

## 2015-07-12 DIAGNOSIS — R079 Chest pain, unspecified: Secondary | ICD-10-CM | POA: Diagnosis not present

## 2015-07-12 DIAGNOSIS — G2581 Restless legs syndrome: Secondary | ICD-10-CM | POA: Diagnosis not present

## 2015-07-12 DIAGNOSIS — E785 Hyperlipidemia, unspecified: Secondary | ICD-10-CM | POA: Diagnosis not present

## 2015-07-12 DIAGNOSIS — M1990 Unspecified osteoarthritis, unspecified site: Secondary | ICD-10-CM | POA: Diagnosis not present

## 2015-07-12 DIAGNOSIS — K219 Gastro-esophageal reflux disease without esophagitis: Secondary | ICD-10-CM | POA: Diagnosis not present

## 2015-07-12 DIAGNOSIS — I10 Essential (primary) hypertension: Secondary | ICD-10-CM | POA: Diagnosis not present

## 2015-07-12 DIAGNOSIS — I209 Angina pectoris, unspecified: Secondary | ICD-10-CM | POA: Diagnosis not present

## 2015-07-12 LAB — BASIC METABOLIC PANEL
Anion gap: 14 (ref 5–15)
BUN: 21 mg/dL — AB (ref 6–20)
CALCIUM: 9.4 mg/dL (ref 8.9–10.3)
CHLORIDE: 100 mmol/L — AB (ref 101–111)
CO2: 24 mmol/L (ref 22–32)
CREATININE: 1.25 mg/dL — AB (ref 0.44–1.00)
GFR calc non Af Amer: 42 mL/min — ABNORMAL LOW (ref >60)
GFR, EST AFRICAN AMERICAN: 48 mL/min — AB (ref >60)
Glucose, Bld: 116 mg/dL — ABNORMAL HIGH (ref 65–99)
Potassium: 3.5 mmol/L (ref 3.5–5.1)
Sodium: 138 mmol/L (ref 135–145)

## 2015-07-12 LAB — CBC
HCT: 40.7 % (ref 36.0–46.0)
Hemoglobin: 12.9 g/dL (ref 12.0–15.0)
MCH: 30.4 pg (ref 26.0–34.0)
MCHC: 31.7 g/dL (ref 30.0–36.0)
MCV: 96 fL (ref 78.0–100.0)
PLATELETS: 349 10*3/uL (ref 150–400)
RBC: 4.24 MIL/uL (ref 3.87–5.11)
RDW: 14.6 % (ref 11.5–15.5)
WBC: 11.4 10*3/uL — ABNORMAL HIGH (ref 4.0–10.5)

## 2015-07-12 MED ORDER — NITROGLYCERIN 0.4 MG SL SUBL
0.4000 mg | SUBLINGUAL_TABLET | SUBLINGUAL | Status: DC | PRN
  Administered 2015-07-12 (×2): 0.4 mg via SUBLINGUAL

## 2015-07-12 MED ORDER — NITROGLYCERIN 0.4 MG SL SUBL
SUBLINGUAL_TABLET | SUBLINGUAL | Status: AC
Start: 2015-07-12 — End: 2015-07-12
  Filled 2015-07-12: qty 1

## 2015-07-12 MED ORDER — METOPROLOL TARTRATE 12.5 MG HALF TABLET
12.5000 mg | ORAL_TABLET | Freq: Two times a day (BID) | ORAL | Status: DC
  Administered 2015-07-12 – 2015-07-14 (×5): 12.5 mg via ORAL
  Filled 2015-07-12 (×5): qty 1

## 2015-07-12 NOTE — Care Management Obs Status (Signed)
[name]   Patient Details  Name: [name] MRN: [id number] Date of Birth: 1940-07-14   Medicare Observation Status Notification Given:  Yes    Dawayne Patricia, RN 07/12/2015, 4:21 PM

## 2015-07-12 NOTE — Telephone Encounter (Signed)
[name], son called and stated that he spoke with you on 2/5 regarding his mother and pain. You wrote them a Rx for Butran patches and is cost too much $291.00 for 4 patches and they cannot afford this. Son would like it changed to something different. Please Advise. Son flying out tomorrow but can leave message. Please Advise.

## 2015-07-12 NOTE — Progress Notes (Signed)
[facility] PROGRESS NOTE  [name] [id number] DOB: 1941-03-19 DOA: 07/11/2015 PCP: Hollace Kinnier, DO  Assessment/Plan: [name] is a 75 y.o. female with a history of hypertension, hyperlipidemia, CKD and TIAs, and recently hospitalized for acute toxic encephalopathy due to sedatives and dehydration from 1/27 through 07/04/2015,presenting to the emergency department with chest pain. She awoke due to acute onset of left-sided, "under the breast", sharp chest pain, without nausea or diaphoresis.  Chest pain, No prior cardiac history. Risk factors include HLD, HTN, FH, obesity), - A1c & lipid panel - Daily aspirin - Continue statin - NTG prn chest pain -had another episode this AM.  -ECHO ordered.  -Cardiology consulted.  -start low dose metoprolol. HR in the 100  Elevated D dimer: V-Q scan low probability. Check Doppler LE.   Chronic kidney disease stage baseline creatinine 1.2-1.4  Current Cr 1.08 Continue to monitor -Minimize nephrotoxins and renally dose medications  Hypertension Will continue clonidine Hold lisinopril due to mild increase cr.   Hyperlipidemia Will continue Lipitor   History of TIAs. No new events during this hospitalization Continue ASA  Chronic pain/ Peripheral Neuropathy. She received 1 dose of MSO4 19m IV at the ED with some relief of her chronic back pain Will continue her home meds with Tylenol and Neurontin. Tramadol as non narcotic alternative  Headaches. She has a h/o chronic headaches. Patient denies worsening symptoms.  Hold  Imitrex until rule out angina  Code Status: Full code.  Family Communication:care discussed with patient.  Disposition Plan: remain inpatient   Consultants:  Cardiology   Procedures:  Doppler;  ECHO;    Antibiotics: None  HPI/Subjective: Patient report another episode of chest pressure this am.  Denies chest pain on exertion, denies dyspnea on exertion   Objective: Filed  Vitals:   07/12/15 0454 07/12/15 1016  BP: 136/68 136/62  Pulse: 108   Temp: 98.5 F (36.9 C)   Resp: 18    No intake or output data in the 24 hours ending 07/12/15 1111 There were no vitals filed for this visit.  Exam:   General:  NAD  Cardiovascular: S 1, S 2 RRR  Respiratory: CTA  Abdomen: BS present, soft ,nt  Musculoskeletal: no edema  Data Reviewed: Basic Metabolic Panel:  Recent Labs Lab 07/07/15 1044 07/11/15 0511 07/12/15 0351  NA 141 138 138  K 4.1 3.4* 3.5  CL 100 102 100*  CO2 23 18* 24  GLUCOSE 102* 145* 116*  BUN 18 13 21*  CREATININE 1.10* 1.08* 1.25*  CALCIUM 9.4 9.4 9.4   Liver Function Tests:  Recent Labs Lab 07/11/15 0511  AST 24  ALT 10*  ALKPHOS 92  BILITOT 1.0  PROT 6.8  ALBUMIN 3.6    Recent Labs Lab 07/11/15 0511  LIPASE 23   No results for input(s): AMMONIA in the last 168 hours. CBC:  Recent Labs Lab 07/07/15 1044 07/11/15 0511 07/12/15 0351  WBC 8.3 11.8* 11.4*  NEUTROABS 4.2 6.9  --   HGB  --  13.5 12.9  HCT 35.5 41.7 40.7  MCV 96 94.3 96.0  PLT 216 355 349   Cardiac Enzymes:  Recent Labs Lab 07/11/15 0511 07/11/15 1009 07/11/15 1315 07/11/15 1606  TROPONINI <0.03 <0.03 <0.03 <0.03   BNP (last 3 results) No results for input(s): BNP in the last 8760 hours.  ProBNP (last 3 results) No results for input(s): PROBNP in the last 8760 hours.  CBG: No results for input(s): GLUCAP in the last 168  hours.  No results found for this or any previous visit (from the past 240 hour(s)).   Studies: Dg Chest 2 View  07/11/2015  CLINICAL DATA:  Pain under LEFT breast with nausea and vomiting. EXAM: CHEST  2 VIEW COMPARISON:  Chest radiograph July 01, 2015 FINDINGS: The cardiac silhouette is mildly enlarged, mediastinal silhouette is nonsuspicious. No pleural effusion or focal consolidation. Similar blunting of the RIGHT costophrenic angle compatible with pleural thickening. No pneumothorax. Soft tissue planes  and included osseous structures are nonsuspicious. Surgical clips in the included right abdomen compatible with cholecystectomy. IMPRESSION: Mild cardiomegaly, no acute pulmonary process. Electronically Signed   By: Elon Alas M.D.   On: 07/11/2015 05:41   Nm Pulmonary Perf And Vent  07/11/2015  CLINICAL DATA:  Chest pain with elevated D-dimer levels. Evaluate for pulmonary embolism. EXAM: NUCLEAR MEDICINE VENTILATION - PERFUSION LUNG SCAN TECHNIQUE: Ventilation images were obtained in multiple projections using inhaled aerosol Tc-69m DTPA. Perfusion images were obtained in multiple projections after intravenous injection of Tc-65m MAA. RADIOPHARMACEUTICALS:  30.1 Technetium-69m DTPA aerosol inhalation and 4.1 Technetium-68m MAA IV COMPARISON:  Radiographs 07/11/2015. FINDINGS: Ventilation: No focal ventilation defect. Some activity is noted within the esophagus and stomach. Perfusion: No wedge shaped peripheral perfusion defects to suggest acute pulmonary embolism. IMPRESSION: Very low probability for acute pulmonary embolism. Electronically Signed   By: Richardean Sale M.D.   On: 07/11/2015 15:56    Scheduled Meds: . aspirin  325 mg Oral Daily  . atorvastatin  40 mg Oral q1800  . cloNIDine  0.1 mg Oral Daily  . docusate sodium  100 mg Oral BID  . enoxaparin (LOVENOX) injection  40 mg Subcutaneous Q24H  . gabapentin  400 mg Oral QID  . lisinopril  5 mg Oral Daily  . nitroGLYCERIN      . potassium chloride  10 mEq Oral Daily  . senna  1 tablet Oral BID  . sertraline  100 mg Oral Daily  . sodium chloride flush  3 mL Intravenous Q12H  . SUMAtriptan  50 mg Oral Once   Continuous Infusions:   Principal Problem:   Chest pain Active Problems:   TIA (transient ischemic attack)   HTN (hypertension)   Hyperlipemia   Depression   Major depressive disorder, recurrent severe without psychotic features (HCC)   OSA (obstructive sleep apnea)   Back pain   History of drug overdose    Hyperlipidemia   Essential hypertension    Time spent: 25 minutes.     Niel Hummer A  [facility] Pager [phone]. If 7PM-7AM, please contact night-coverage at [email or web address], password [email or web address] 07/12/2015, 11:11 AM

## 2015-07-12 NOTE — Progress Notes (Signed)
Echocardiogram 2D Echocardiogram has been performed.  Tresa Res 07/12/2015, 2:29 PM

## 2015-07-12 NOTE — Consult Note (Signed)
Reason for Consult: Chest Pain  Requesting Physician: Dr Frederic Jericho  HPI: [name] is a 75 year old moderately overweight widowed Caucasian female mother of 2 sons who was admitted yesterday with chest pain. Her risk factors include treated hypertension, hyperlipidemia and family history with a brother and father both of whom had ischemic heart disease. [name] never had a heart attack or stroke. She had back surgery last November. Remote 2-D echo performed in 2013 was normal. She had chest pain yesterday characterized as a pressure. She had recurrent symptoms this morning with supplemental nitroglycerin. [name] currently pain-free.   Problem List: Patient Active Problem List   Diagnosis Date Noted  . Chest pain 07/11/2015  . History of drug overdose 07/07/2015  . Frequent falls 07/07/2015  . Hyperlipidemia 07/07/2015  . Essential hypertension 07/07/2015  . Spinal stenosis, lumbar region, with neurogenic claudication 07/07/2015  . Severe episode of recurrent major depressive disorder, with psychotic features ([place]) 07/07/2015  . Visual hallucination 07/01/2015  . Delirium   . Severe episode of recurrent major depressive disorder, without psychotic features ([place])   . Back pain 06/20/2015  . MDD (major depressive disorder), recurrent episode, severe ([place]) 06/16/2015  . Lumbar stenosis 04/19/2015  . Hypokalemia 09/19/2014  . Constipation 09/19/2014  . Acute encephalopathy 09/18/2014  . Acute respiratory failure ([place]) 09/17/2014  . OSA (obstructive sleep apnea) 09/17/2014  . Ankle fracture, lateral malleolus, closed 09/14/2014  . Major depressive disorder, recurrent severe without psychotic features ([place]) 08/07/2014  . Insomnia 08/05/2014  . Hip joint painful on movement 08/05/2014  . Degenerative disc disease, lumbar 08/05/2014  . Severe obesity (BMI >= 40) ([place]) 01/11/2014  . Hyperlipemia   . Depression   . Arthritis   . Polyneuropathy in other diseases classified elsewhere  ([place]) 04/08/2013  . Restless legs syndrome (RLS) 04/08/2013  . UTI (lower urinary tract infection) 10/14/2011  . Dizziness 10/12/2011  . Fall 10/12/2011  . TIA (transient ischemic attack) 10/12/2011  . Cellulitis 10/12/2011  . HTN (hypertension) 10/12/2011  . GERD (gastroesophageal reflux disease) 10/12/2011    PMHx:  Past Medical History  Diagnosis Date  . GERD (gastroesophageal reflux disease)     hx pud '98  . Arthritis     djd  . Restless leg syndrome     takes Sinemet daily  . 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 Dysrhythmia     hx tachy palpitations  . Neuromuscular disorder ([place])     peripheral neuropathy, FEET AND LEGS  . Stroke [facility])     ? 6 months ago - tests okay - [facility]   . Cellulitis     10/11/11 hospitalized for cellulitis   . Hyperlipemia     takes Atorvastatin daily  . Peripheral neuropathy ([place])   . Elbow fracture, left April 2015  . Depression     takes Zoloft daily  . Vertigo     takes Meclizine daily as needed  . Insomnia     takes restoril nightly as needed  . Hypertension     takes HCTZ daily  . Peripheral neuropathy ([facility])     takes Gabapentin daily  . History of bronchitis     many yrs ago  . Headache(784.0)     migraines yrs ago-takes Imitrex daily  . Joint pain   . Joint swelling   . Chronic back pain     scoliosis/stenosis  . Anxiety     takes Ativan daily as  needed  . Cataracts, bilateral     immature   . Depression    Past Surgical History  Procedure Laterality Date  . Cervical disc surgery x2  2011  . Rt carotid enarterectomy  2011  . Rt knee arthroscopy  '99  . Left knee arthroscopy  2001  . Hematoma evacuation  2011    s/p rt cea  . Joint replacement  '01    total knee replacement, LEFT  . Abdominal hysterectomy  1975    bil oophorectomy  . Back surgery   MANY YRS AGO    laminectomy x3  . Cholecystectomy  1993  . Total knee arthroplasty  09/13/2011    Procedure: TOTAL KNEE ARTHROPLASTY;  Surgeon: Magnus Sinning, MD;  Location: [facility];  Service: Orthopedics;  Laterality: Right;  . Right knee replacement      09/2011   . Carpal tunnel release  07/09/2012    Procedure: CARPAL TUNNEL RELEASE;  Surgeon: Magnus Sinning, MD;  Location: [facility];  Service: Orthopedics;  Laterality: Left;  . Finger arthroplasty  07/09/2012    Procedure: FINGER ARTHROPLASTY;  Surgeon: Magnus Sinning, MD;  Location: [facility];  Service: Orthopedics;  Laterality: Left;  Interposition Arthroplasty [facility] Joint Thumb Left   . Lipoma excision  07/09/2012    Procedure: EXCISION LIPOMA;  Surgeon: Magnus Sinning, MD;  Location: [facility];  Service: Orthopedics;  Laterality: Left;  Excision Lipoma Dorsum Left Wrist   . Orif ankle fracture Right 09/14/2014    FIBULA   . Orif ankle fracture Right 09/14/2014    Procedure: OPEN REDUCTION INTERNAL FIXATION (ORIF) RIGHT ANKLE FRACTURE/SYNDESMOSIS ;  Surgeon: Wylene Simmer, MD;  Location: [facility];  Service: Orthopedics;  Laterality: Right;  . Colonosocpy    . Esophagogastroduodenoscopy    . Lumbar laminectomy with coflex 1 level Bilateral 04/19/2015    Procedure: LUMBAR TWO-THREE LUMBAR LAMINECTOMY WITH COFLEX ;  Surgeon: Kristeen Miss, MD;  Location: [facility];  Service: Neurosurgery;  Laterality: Bilateral;  Bilateral L23 laminectomy and foraminotomy with coflex    FAMHx: Family History  Problem Relation Age of Onset  . Congestive Heart Failure Father   . Congestive Heart Failure Sister   . Alzheimer's disease Mother   . Diabetes Brother   . Arthritis Brother     SOCHx:  reports that she has never smoked. She has never used smokeless tobacco. She reports that she does not drink alcohol or use illicit drugs.  ALLERGIES: Allergies  Allergen Reactions  . Contrast Media [Iodinated Diagnostic Agents] Other (See Comments)    Respiratory failure  . Acyclovir And Related Other (See Comments)    unknown  . Sulfa Drugs Cross Reactors Nausea And Vomiting    ROS: Pertinent items are  noted in HPI.  HOME MEDICATIONS: Prescriptions prior to admission  Medication Sig Dispense Refill Last Dose  . acetaminophen (TYLENOL) 500 MG tablet Take 2 tablets (1,000 mg total) by mouth 3 (three) times daily. 90 tablet 0 07/10/2015 at Unknown time  . aspirin 325 MG tablet Take 1 tablet (325 mg total) by mouth daily. For heart health   07/10/2015 at Unknown time  . atorvastatin (LIPITOR) 40 MG tablet Take one tablet by mouth once daily for cholesterol 90 tablet 3 07/10/2015 at Unknown time  . cholecalciferol (VITAMIN D) 1000 units tablet Take 1 tablet (1,000 Units total) by mouth daily. For bone health   07/10/2015 at Unknown time  . cloNIDine (CATAPRES) 0.1 MG tablet Take if  SBP is greater than 160 30 tablet 3 07/10/2015 at Unknown time  . docusate sodium (COLACE) 100 MG capsule Take 1 capsule (100 mg total) by mouth daily. For constipation 30 capsule 0 07/10/2015 at Unknown time  . gabapentin (NEURONTIN) 400 MG capsule Take 1 capsule (400 mg total) by mouth 4 (four) times daily. For pain management/agitation 90 capsule 3 07/10/2015 at Unknown time  . lisinopril (PRINIVIL,ZESTRIL) 5 MG tablet Take 1 tablet (5 mg total) by mouth daily. For high blood pressure 30 tablet 3 07/10/2015 at Unknown time  . potassium chloride (K-DUR,KLOR-CON) 10 MEQ tablet Take 1 tablet (10 mEq total) by mouth daily. For low potassium 30 tablet 0 07/10/2015 at Unknown time  . pyridOXINE (VITAMIN B-6) 50 MG tablet Take 1 tablet (50 mg total) by mouth daily. For Vitamin B-6 supplement   07/10/2015 at Unknown time  . sertraline (ZOLOFT) 50 MG tablet Take 2 tablets (100 mg total) by mouth daily. 60 tablet 3 07/10/2015 at Unknown time  . sodium chloride (OCEAN) 0.65 % SOLN nasal spray Place 1 spray into both nostrils as needed for congestion.  0 Past Month at Unknown time  . SUMAtriptan (IMITREX) 50 MG tablet Take 50 mg by mouth as needed for migraine or headache 30 tablet 3 07/10/2015 at Unknown time    HOSPITAL MEDICATIONS: I have reviewed  the patient's current medications.  VITALS: Blood pressure 136/62, pulse 108, temperature 98.5 F (36.9 C), temperature source Oral, resp. rate 18, SpO2 94 %.  INPUT/OUTPUT        PHYSICAL EXAM: General appearance: alert and no distress Neck: no adenopathy, no carotid bruit, no JVD, supple, symmetrical, trachea midline and thyroid not enlarged, symmetric, no tenderness/mass/nodules Lungs: clear to auscultation bilaterally Heart: regular rate and rhythm, S1, S2 normal, no murmur, click, rub or gallop Extremities: extremities normal, atraumatic, no cyanosis or edema  LABS:  BMP  Recent Labs  07/07/15 1044 07/11/15 0511 07/12/15 0351  NA 141 138 138  K 4.1 3.4* 3.5  CL 100 102 100*  CO2 23 18* 24  GLUCOSE 102* 145* 116*  BUN 18 13 21*  CREATININE 1.10* 1.08* 1.25*  CALCIUM 9.4 9.4 9.4  GFRNONAA 50* 50* 42*  GFRAA 58* 58* 48*    CBC  Recent Labs Lab 07/12/15 0351  WBC 11.4*  RBC 4.24  HGB 12.9  HCT 40.7  PLT 349  MCV 96.0    HEMOGLOBIN A1C Lab Results  Component Value Date   HGBA1C 6.2* 07/02/2015   MPG 131 07/02/2015    Cardiac Panel (last 3 results)  Recent Labs  07/11/15 1009 07/11/15 1315 07/11/15 1606  TROPONINI <0.03 <0.03 <0.03    BNP (last 3 results) No results for input(s): PROBNP in the last 8760 hours.  TSH  Recent Labs  04/20/15 1325  TSH 0.890    CHOLESTEROL  Recent Labs  11/11/14 1135 06/18/15 0726 07/02/15 0544  CHOL 222* 218* 117    Hepatic Function Panel  Recent Labs  07/01/15 2111 07/02/15 0544 07/11/15 0511  PROT 6.1* 5.4* 6.8  ALBUMIN 3.1* 2.6* 3.6  AST 53* 21 24  ALT 11* 10* 10*  ALKPHOS 109 99 92  BILITOT 1.4* 0.3 1.0    IMAGING: Dg Chest 2 View  07/11/2015  CLINICAL DATA:  Pain under LEFT breast with nausea and vomiting. EXAM: CHEST  2 VIEW COMPARISON:  Chest radiograph July 01, 2015 FINDINGS: The cardiac silhouette is mildly enlarged, mediastinal silhouette is nonsuspicious. No pleural  effusion or focal  consolidation. Similar blunting of the RIGHT costophrenic angle compatible with pleural thickening. No pneumothorax. Soft tissue planes and included osseous structures are nonsuspicious. Surgical clips in the included right abdomen compatible with cholecystectomy. IMPRESSION: Mild cardiomegaly, no acute pulmonary process. Electronically Signed   By: Elon Alas M.D.   On: 07/11/2015 05:41   Nm Pulmonary Perf And Vent  07/11/2015  CLINICAL DATA:  Chest pain with elevated D-dimer levels. Evaluate for pulmonary embolism. EXAM: NUCLEAR MEDICINE VENTILATION - PERFUSION LUNG SCAN TECHNIQUE: Ventilation images were obtained in multiple projections using inhaled aerosol Tc-58m DTPA. Perfusion images were obtained in multiple projections after intravenous injection of Tc-38m MAA. RADIOPHARMACEUTICALS:  30.1 Technetium-58m DTPA aerosol inhalation and 4.1 Technetium-40m MAA IV COMPARISON:  Radiographs 07/11/2015. FINDINGS: Ventilation: No focal ventilation defect. Some activity is noted within the esophagus and stomach. Perfusion: No wedge shaped peripheral perfusion defects to suggest acute pulmonary embolism. IMPRESSION: Very low probability for acute pulmonary embolism. Electronically Signed   By: Richardean Sale M.D.   On: 07/11/2015 15:56    IMPRESSION: 1. Chest pain-new onset chest pain with positive risk factors currently pain-free. Her enzymes are negative and her EKG shows no acute changes. We will obtain a pharmacologic Myoview stress test tomorrow to risk stratify her 2. Hypertension-currently controlled on lisinopril and clonidine. Beta blocker was started by the primary team 3. Hyperlipidemia-on statin therapy   RECOMMENDATION: 1. Pharmacologic Myoview stress test, further recommendations to follow  Time Spent Directly with Patient: 20 minutes  Quay Burow 07/12/2015, 11:55 AM

## 2015-07-12 NOTE — Progress Notes (Signed)
Patient complaint of chest pressure 7 out of 10 ,EKG done-sinus tachycardia ,B/P is 130/60,o2 saturation 94% on room air,nitroglycerin SL times 2 given per protocol,chest pressure is 0 out of 10 after 2  doses.Forrest Moron NP made aware.will continue to monitor.

## 2015-07-13 ENCOUNTER — Encounter: Payer: Medicare Other | Admitting: Cardiology

## 2015-07-13 ENCOUNTER — Observation Stay (HOSPITAL_COMMUNITY): Payer: Medicare Other

## 2015-07-13 ENCOUNTER — Observation Stay (HOSPITAL_BASED_OUTPATIENT_CLINIC_OR_DEPARTMENT_OTHER): Payer: Medicare Other

## 2015-07-13 DIAGNOSIS — R609 Edema, unspecified: Secondary | ICD-10-CM | POA: Diagnosis not present

## 2015-07-13 DIAGNOSIS — R079 Chest pain, unspecified: Secondary | ICD-10-CM

## 2015-07-13 DIAGNOSIS — E785 Hyperlipidemia, unspecified: Secondary | ICD-10-CM | POA: Diagnosis not present

## 2015-07-13 DIAGNOSIS — I209 Angina pectoris, unspecified: Secondary | ICD-10-CM | POA: Diagnosis not present

## 2015-07-13 DIAGNOSIS — I10 Essential (primary) hypertension: Secondary | ICD-10-CM | POA: Diagnosis not present

## 2015-07-13 DIAGNOSIS — N179 Acute kidney failure, unspecified: Secondary | ICD-10-CM | POA: Diagnosis not present

## 2015-07-13 LAB — NM MYOCAR MULTI W/SPECT W/WALL MOTION / EF
CHL CUP MPHR: 147 {beats}/min
CHL CUP RESTING HR STRESS: 107 {beats}/min
CSEPED: 0 min
CSEPHR: 79 %
Estimated workload: 1 METS
Exercise duration (sec): 0 s
Peak HR: 117 {beats}/min

## 2015-07-13 MED ORDER — TECHNETIUM TC 99M SESTAMIBI - CARDIOLITE
30.0000 | Freq: Once | INTRAVENOUS | Status: AC | PRN
  Administered 2015-07-13: 30 via INTRAVENOUS

## 2015-07-13 MED ORDER — REGADENOSON 0.4 MG/5ML IV SOLN
INTRAVENOUS | Status: AC
Start: 2015-07-13 — End: 2015-07-13
  Administered 2015-07-13: 0.4 mg via INTRAVENOUS
  Filled 2015-07-13: qty 5

## 2015-07-13 MED ORDER — CAMPHOR-MENTHOL-METHYL SAL 1.2-5.7-6.3 % EX PTCH
MEDICATED_PATCH | CUTANEOUS | Status: DC
Start: 2015-07-13 — End: 2015-07-22

## 2015-07-13 MED ORDER — TECHNETIUM TC 99M SESTAMIBI GENERIC - CARDIOLITE
10.0000 | Freq: Once | INTRAVENOUS | Status: AC | PRN
  Administered 2015-07-13: 10 via INTRAVENOUS

## 2015-07-13 MED ORDER — REGADENOSON 0.4 MG/5ML IV SOLN
0.4000 mg | Freq: Once | INTRAVENOUS | Status: AC
  Administered 2015-07-13 (×2): 0.4 mg via INTRAVENOUS
  Filled 2015-07-13: qty 5

## 2015-07-13 NOTE — Telephone Encounter (Signed)
Patient son notified

## 2015-07-13 NOTE — Progress Notes (Signed)
Patient Name: [name] Date of Encounter: 07/13/2015  Primary Cardiologist: new - Dr. Gwenlyn Found   Principal Problem:   Chest pain Active Problems:   TIA (transient ischemic attack)   Depression   Major depressive disorder, recurrent severe without psychotic features ([place])   OSA (obstructive sleep apnea)   Back pain   History of drug overdose   Hyperlipidemia   Essential hypertension    SUBJECTIVE  Denies any CP since the day of admission. No SOB. Feeling well this morning. Seen during stress test  CURRENT MEDS . aspirin  325 mg Oral Daily  . atorvastatin  40 mg Oral q1800  . cloNIDine  0.1 mg Oral Daily  . docusate sodium  100 mg Oral BID  . enoxaparin (LOVENOX) injection  40 mg Subcutaneous Q24H  . gabapentin  400 mg Oral QID  . metoprolol tartrate  12.5 mg Oral BID  . senna  1 tablet Oral BID  . sertraline  100 mg Oral Daily  . sodium chloride flush  3 mL Intravenous Q12H    OBJECTIVE  Filed Vitals:   07/13/15 1159 07/13/15 1201 07/13/15 1203 07/13/15 1205  BP: 181/68 184/71 199/68 195/66  Pulse:      Temp:      TempSrc:      Resp:      Height:      Weight:      SpO2:        Intake/Output Summary (Last 24 hours) at 07/13/15 1214 Last data filed at 07/12/15 1700  Gross per 24 hour  Intake    240 ml  Output      0 ml  Net    240 ml   Filed Weights   07/13/15 0359  Weight: 194 lb 14.2 oz (88.4 kg)    PHYSICAL EXAM  General: Pleasant, NAD. Neuro: Alert and oriented X 3. Moves all extremities spontaneously. Psych: Normal affect. HEENT:  Normal  Neck: Supple without bruits or JVD. Lungs:  Resp regular and unlabored, CTA. Heart: RRR no s3, s4, or murmurs. Abdomen: Soft, non-tender, non-distended, BS + x 4.  Extremities: No clubbing, cyanosis or edema. DP/PT/Radials 2+ and equal bilaterally.  Accessory Clinical Findings  CBC  Recent Labs  07/11/15 0511 07/12/15 0351  WBC 11.8* 11.4*  NEUTROABS 6.9  --   HGB 13.5 12.9  HCT 41.7 40.7    MCV 94.3 96.0  PLT 355 0000000   Basic Metabolic Panel  Recent Labs  07/11/15 0511 07/12/15 0351  NA 138 138  K 3.4* 3.5  CL 102 100*  CO2 18* 24  GLUCOSE 145* 116*  BUN 13 21*  CREATININE 1.08* 1.25*  CALCIUM 9.4 9.4   Liver Function Tests  Recent Labs  07/11/15 0511  AST 24  ALT 10*  ALKPHOS 92  BILITOT 1.0  PROT 6.8  ALBUMIN 3.6    Recent Labs  07/11/15 0511  LIPASE 23   Cardiac Enzymes  Recent Labs  07/11/15 1009 07/11/15 1315 07/11/15 1606  TROPONINI <0.03 <0.03 <0.03   D-Dimer  Recent Labs  07/11/15 1009  DDIMER 1.31*     ECG  NSR without significant ST-T wave changes  Echocardiogram 07/12/2015  LV EF: 65% -  70%  ------------------------------------------------------------------- Indications:   Chest pain 786.51.  ------------------------------------------------------------------- History:  PMH:  Stroke. Risk factors: Hypertension.  ------------------------------------------------------------------- Study Conclusions  - Left ventricle: The cavity size was normal. Wall thickness was normal. Systolic function was vigorous. The estimated ejection fraction was in the range  of 65% to 70%. Wall motion was normal; there were no regional wall motion abnormalities. Doppler parameters are consistent with abnormal left ventricular relaxation (grade 1 diastolic dysfunction). - Aortic valve: Poorly visualized. There was no stenosis. - Mitral valve: There was no significant regurgitation. - Right ventricle: The cavity size was normal. Systolic function was normal. - Pulmonary arteries: No complete TR doppler jet so unable to estimate PA systolic pressure. - Inferior vena cava: The vessel was normal in size. The respirophasic diameter changes were in the normal range (>= 50%), consistent with normal central venous pressure. - Pericardium, extracardiac: Small pericardial effusion.  Impressions:  - Normal LV size  with EF 65-70%. Normal RV size and systolic function. No significant valvular abnormalities noted (though aortic valve was poorly visualized).     Radiology/Studies  Dg Chest 2 View  07/11/2015  CLINICAL DATA:  Pain under LEFT breast with nausea and vomiting. EXAM: CHEST  2 VIEW COMPARISON:  Chest radiograph July 01, 2015 FINDINGS: The cardiac silhouette is mildly enlarged, mediastinal silhouette is nonsuspicious. No pleural effusion or focal consolidation. Similar blunting of the RIGHT costophrenic angle compatible with pleural thickening. No pneumothorax. Soft tissue planes and included osseous structures are nonsuspicious. Surgical clips in the included right abdomen compatible with cholecystectomy. IMPRESSION: Mild cardiomegaly, no acute pulmonary process. Electronically Signed   By: Elon Alas M.D.   On: 07/11/2015 05:41   Dg Chest 2 View  07/01/2015  CLINICAL DATA:  Altered mental status for 1 week. Initial encounter. EXAM: CHEST  2 VIEW COMPARISON:  PA and lateral chest 10/11/2011.  CT chest 09/17/2014. FINDINGS: Elevation of the right hemidiaphragm is unchanged. Lungs are clear. There is cardiomegaly. No pneumothorax or pleural effusion. IMPRESSION: Cardiomegaly without acute disease. Electronically Signed   By: Inge Rise M.D.   On: 07/01/2015 18:19   Ct Head Wo Contrast  07/01/2015  CLINICAL DATA:  Increasing falls, hallucinations, and altered mental status. EXAM: CT HEAD WITHOUT CONTRAST CT CERVICAL SPINE WITHOUT CONTRAST TECHNIQUE: Multidetector CT imaging of the head and cervical spine was performed following the standard protocol without intravenous contrast. Multiplanar CT image reconstructions of the cervical spine were also generated. COMPARISON:  11/05/2014 FINDINGS: CT HEAD FINDINGS Mild cerebral atrophy. No ventricular dilatation. Patchy low-attenuation changes in the deep white matter consistent with small vessel ischemia. No mass effect or midline shift. No  abnormal extra-axial fluid collections. Gray-white matter junctions are distinct. Basal cisterns are not effaced. No evidence of acute intracranial hemorrhage. No depressed skull fractures. Mild mucosal thickening in the paranasal sinuses. No acute air-fluid levels. Mastoid air cells are not opacified. Vascular calcifications. CT CERVICAL SPINE FINDINGS Postoperative changes with anterior plate and screw fixation and intervertebral fusion from C5 through C7. There is discontinuity of the fixation plate between C5 and C6 but this appearance is unchanged since previous study. Bony fusion appears intact. Normal alignment of the cervical spine. No vertebral compression deformities. No prevertebral soft tissue swelling. C1-2 articulation appears intact. Degenerative changes throughout the facet joints. No focal bone lesion or bone destruction. The upper esophagus is mildly distended and contains gas and presumably ingested material. Dysmotility or distal esophageal stricture not excluded. IMPRESSION: No acute intracranial abnormalities. Anterior plate and screw fixation with intervertebral fusion from C5 through C7. Fracture of the hardware plate from D34-534, unchanged since previous studies. Degenerative changes in the cervical spine. No acute displaced fractures identified. Incidental note of mild dilatation and residual material in the upper esophagus. Esophageal dysmotility or distal stricture not  excluded. Electronically Signed   By: Lucienne Capers M.D.   On: 07/01/2015 18:46   Ct Cervical Spine Wo Contrast  07/01/2015  CLINICAL DATA:  Increasing falls, hallucinations, and altered mental status. EXAM: CT HEAD WITHOUT CONTRAST CT CERVICAL SPINE WITHOUT CONTRAST TECHNIQUE: Multidetector CT imaging of the head and cervical spine was performed following the standard protocol without intravenous contrast. Multiplanar CT image reconstructions of the cervical spine were also generated. COMPARISON:  11/05/2014 FINDINGS:  CT HEAD FINDINGS Mild cerebral atrophy. No ventricular dilatation. Patchy low-attenuation changes in the deep white matter consistent with small vessel ischemia. No mass effect or midline shift. No abnormal extra-axial fluid collections. Gray-white matter junctions are distinct. Basal cisterns are not effaced. No evidence of acute intracranial hemorrhage. No depressed skull fractures. Mild mucosal thickening in the paranasal sinuses. No acute air-fluid levels. Mastoid air cells are not opacified. Vascular calcifications. CT CERVICAL SPINE FINDINGS Postoperative changes with anterior plate and screw fixation and intervertebral fusion from C5 through C7. There is discontinuity of the fixation plate between C5 and C6 but this appearance is unchanged since previous study. Bony fusion appears intact. Normal alignment of the cervical spine. No vertebral compression deformities. No prevertebral soft tissue swelling. C1-2 articulation appears intact. Degenerative changes throughout the facet joints. No focal bone lesion or bone destruction. The upper esophagus is mildly distended and contains gas and presumably ingested material. Dysmotility or distal esophageal stricture not excluded. IMPRESSION: No acute intracranial abnormalities. Anterior plate and screw fixation with intervertebral fusion from C5 through C7. Fracture of the hardware plate from [id number], unchanged since previous studies. Degenerative changes in the cervical spine. No acute displaced fractures identified. Incidental note of mild dilatation and residual material in the upper esophagus. Esophageal dysmotility or distal stricture not excluded. Electronically Signed   By: Lucienne Capers M.D.   On: 07/01/2015 18:46   Mr Brain Wo Contrast  07/02/2015  CLINICAL DATA:  Initial evaluation for acute encephalopathy. EXAM: MRI HEAD WITHOUT CONTRAST TECHNIQUE: Multiplanar, multiecho pulse sequences of the brain and surrounding structures were obtained without  intravenous contrast. COMPARISON:  Prior CT from 07/01/2015. FINDINGS: Mild diffuse prominence of the CSF containing spaces is compatible with generalized age-related cerebral atrophy. Scattered patchy T2/FLAIR hyperintensity within the periventricular and deep white matter most consistent with chronic small vessel ischemic disease, mild for age. Minimal small vessel type changes present within the pons. No abnormal foci of restricted diffusion to suggest acute intracranial infarct. Gray-white matter differentiation maintained. Normal intravascular flow voids preserved. No acute or chronic intracranial hemorrhage. No areas of chronic infarction. No mass lesion, midline shift, or mass effect. No hydrocephalus. No extra-axial fluid collection. Craniocervical junction within normal limits. Visualized upper cervical spine unremarkable. Pituitary gland normal.  No acute abnormality about the orbits. Mild mucosal thickening within the paranasal sinuses. No air-fluid levels to suggest active sinus infection. Trace opacity within the mastoid air cells. Inner ear structures grossly normal. Bone marrow signal intensity within normal limits. No scalp soft tissue abnormality. IMPRESSION: 1. No acute intracranial infarct or other process identified. 2. Mild age-related cerebral atrophy with chronic small vessel ischemic disease. Electronically Signed   By: Jeannine Boga M.D.   On: 07/02/2015 02:22   Nm Pulmonary Perf And Vent  07/11/2015  CLINICAL DATA:  Chest pain with elevated D-dimer levels. Evaluate for pulmonary embolism. EXAM: NUCLEAR MEDICINE VENTILATION - PERFUSION LUNG SCAN TECHNIQUE: Ventilation images were obtained in multiple projections using inhaled aerosol Tc-73m DTPA. Perfusion images were obtained in multiple projections  after intravenous injection of Tc-13m MAA. RADIOPHARMACEUTICALS:  30.1 Technetium-25m DTPA aerosol inhalation and 4.1 Technetium-3m MAA IV COMPARISON:  Radiographs 07/11/2015.  FINDINGS: Ventilation: No focal ventilation defect. Some activity is noted within the esophagus and stomach. Perfusion: No wedge shaped peripheral perfusion defects to suggest acute pulmonary embolism. IMPRESSION: Very low probability for acute pulmonary embolism. Electronically Signed   By: Richardean Sale M.D.   On: 07/11/2015 15:56    ASSESSMENT AND PLAN  1. Chest pain  - Echo 07/12/2015 EF 65-70%, grade 1 DD, no RWMA, small pericardial effusion  - pending lexiscan myoview today. V/Q scan negative, pending LE venous U/S. Likely discharge if myoview and venous doppler negative.  2. HTN: on metoprolol, clonidine 3. HLD: on 40mg  lipitor  Signed, Woodward Ku Pager: [phone]   Agree with note by Almyra Deforest PA-C  Awaiting results of [place]. Enz neg. 2D OK. If MV low risk can be D/Cd home.   Lorretta Harp, M.D., [place], [facility], [place] [address] [place], [place]  [address]  [phone] 07/13/2015 1:19 PM

## 2015-07-13 NOTE — Care Management Note (Signed)
Case Management Note Marvetta Gibbons RN, BSN Unit 2W-Case Manager [phone]  Patient Details  Name: [name] MRN: [id number] Date of Birth: Jan 14, 1941  Subjective/Objective:  Pt admitted with chest pain for stress test 07/13/15                  Action/Plan: PTA pt lived at home alone- was active with [facility] for [facility] services- RN/PT/OT/SW- will need resumption orders on discharge for HH-RN/PT/OT/SW, Manuela Schwartz with [facility] following for discharge  Expected Discharge Date:      07/14/15            Expected Discharge Plan:  [place]  In-House Referral:     Discharge planning Services  CM Consult  Post Acute Care Choice:  Home Health, Resumption of Svcs/PTA Provider Choice offered to:  Patient  DME Arranged:    DME Agency:     HH Arranged:  RN, PT, OT, Social Work [employer] Agency:  [place]  Status of Service:  In process, will continue to follow  Medicare Important Message Given:    Date Medicare IM Given:    Medicare IM give by:    Date Additional Medicare IM Given:    Additional Medicare Important Message give by:     If discussed at [place] of Stay Meetings, dates discussed:    Additional Comments:  Dawayne Patricia, RN 07/13/2015, 3:34 PM

## 2015-07-13 NOTE — Progress Notes (Signed)
1 min, Hao PA at bedside, pt reports some "chest pressure" denies SOB

## 2015-07-13 NOTE — Progress Notes (Signed)
7 mins, Hao PA at bedside, pt reports "chest discomfort" has "eased off" denies SOB

## 2015-07-13 NOTE — Progress Notes (Signed)
[facility] PROGRESS NOTE  [name] [id number] DOB: 08-Dec-1940 DOA: 07/11/2015 PCP: Hollace Kinnier, DO  Assessment/Plan: [name] is a 75 y.o. female with a history of hypertension, hyperlipidemia, CKD and TIAs, and recently hospitalized for acute toxic encephalopathy due to sedatives and dehydration from 1/27 through 07/04/2015,presenting to the emergency department with chest pain. She awoke due to acute onset of left-sided, "under the breast", sharp chest pain, without nausea or diaphoresis.  Chest pain, No prior cardiac history. Risk factors include HLD, HTN, FH, obesity), - A1c & lipid panel - Daily aspirin - Continue statin - NTG prn chest pain -had another episode this AM.  -ECHO ordered.  -Cardiology consulted.  -start low dose metoprolol. HR in the 100 -low risk stress test  Elevated D dimer: V-Q scan low probability. negative Doppler LE.   Chronic kidney disease stage baseline creatinine 1.2-1.4  Current Cr 1.08 Continue to monitor -Minimize nephrotoxins and renally dose medications  Hypertension Will continue clonidine Hold lisinopril due to mild increase cr.   Hyperlipidemia Will continue Lipitor   History of TIAs. No new events during this hospitalization Continue ASA  Chronic pain/ Peripheral Neuropathy. She received 1 dose of MSO4 65m IV at the ED with some relief of her chronic back pain Will continue her home meds with Tylenol and Neurontin. Tramadol as non narcotic alternative  Headaches. She has a h/o chronic headaches. Patient denies worsening symptoms.  Hold  Imitrex until rule out angina  H/o frequent falls: pt eval, likely will need home health  Anxiety/depression: continue home meds  Code Status: Full code.  Family Communication:care discussed with patient.  Disposition Plan: remain inpatient   Consultants:  Cardiology    Procedures:  Doppler;  ECHO;    Antibiotics: None  HPI/Subjective: Significant anxiety, currently feeling better.  Objective: Filed Vitals:   07/13/15 2047 07/13/15 2221  BP: 173/73 154/67  Pulse: 102 98  Temp: 98.9 F (37.2 C)   Resp: 16    No intake or output data in the 24 hours ending 07/13/15 2222 Filed Weights   07/13/15 0359  Weight: 88.4 kg (194 lb 14.2 oz)    Exam:   General:  NAD  Cardiovascular: S 1, S 2 RRR  Respiratory: CTA  Abdomen: BS present, soft ,nt  Musculoskeletal: no edema  Data Reviewed: Basic Metabolic Panel:  Recent Labs Lab 07/07/15 1044 07/11/15 0511 07/12/15 0351  NA 141 138 138  K 4.1 3.4* 3.5  CL 100 102 100*  CO2 23 18* 24  GLUCOSE 102* 145* 116*  BUN 18 13 21*  CREATININE 1.10* 1.08* 1.25*  CALCIUM 9.4 9.4 9.4   Liver Function Tests:  Recent Labs Lab 07/11/15 0511  AST 24  ALT 10*  ALKPHOS 92  BILITOT 1.0  PROT 6.8  ALBUMIN 3.6    Recent Labs Lab 07/11/15 0511  LIPASE 23   No results for input(s): AMMONIA in the last 168 hours. CBC:  Recent Labs Lab 07/07/15 1044 07/11/15 0511 07/12/15 0351  WBC 8.3 11.8* 11.4*  NEUTROABS 4.2 6.9  --   HGB  --  13.5 12.9  HCT 35.5 41.7 40.7  MCV 96 94.3 96.0  PLT 216 355 349   Cardiac Enzymes:  Recent Labs Lab 07/11/15 0511 07/11/15 1009 07/11/15 1315 07/11/15 1606  TROPONINI <0.03 <0.03 <0.03 <0.03   BNP (last 3 results) No results for input(s): BNP in the last 8760 hours.  ProBNP (last 3 results) No results for input(s): PROBNP in the last  8760 hours.  CBG: No results for input(s): GLUCAP in the last 168 hours.  No results found for this or any previous visit (from the past 240 hour(s)).   Studies: Nm Myocar Multi W/spect W/wall Motion / Ef  07/13/2015  CLINICAL DATA:  LEFT chest pressure.  74 year old female. EXAM: MYOCARDIAL IMAGING WITH SPECT (REST AND PHARMACOLOGIC-STRESS) GATED LEFT VENTRICULAR WALL MOTION STUDY LEFT VENTRICULAR  EJECTION FRACTION TECHNIQUE: Standard myocardial SPECT imaging was performed after resting intravenous injection of 10 mCi Tc-43m sestamibi. Subsequently, intravenous infusion of Lexiscan was performed under the supervision of the Cardiology staff. At peak effect of the drug, 30 mCi Tc-74m sestamibi was injected intravenously and standard myocardial SPECT imaging was performed. Quantitative gated imaging was also performed to evaluate left ventricular wall motion, and estimate left ventricular ejection fraction. COMPARISON:  None. FINDINGS: Perfusion: No decreased activity in the left ventricle on stress imaging to suggest reversible ischemia or infarction. Wall Motion: Normal left ventricular wall motion. No left ventricular dilation. Left Ventricular Ejection Fraction: 70 % End diastolic volume 47 ml End systolic volume 14 ml IMPRESSION: 1. No reversible ischemia or infarction. 2. Normal left ventricular wall motion. 3. Left ventricular ejection fraction 70% 4. Low-risk stress test findings*. *2012 Appropriate Use Criteria for Coronary Revascularization Focused Update: J Am Coll Cardiol. [id number]. http://content.airportbarriers.com.aspx?articleid=1201161 Electronically Signed   By: Suzy Bouchard M.D.   On: 07/13/2015 14:41    Scheduled Meds: . aspirin  325 mg Oral Daily  . atorvastatin  40 mg Oral q1800  . cloNIDine  0.1 mg Oral Daily  . docusate sodium  100 mg Oral BID  . enoxaparin (LOVENOX) injection  40 mg Subcutaneous Q24H  . gabapentin  400 mg Oral QID  . metoprolol tartrate  12.5 mg Oral BID  . senna  1 tablet Oral BID  . sertraline  100 mg Oral Daily  . sodium chloride flush  3 mL Intravenous Q12H   Continuous Infusions:   Principal Problem:   Chest pain Active Problems:   TIA (transient ischemic attack)   Depression   Major depressive disorder, recurrent severe without psychotic features ([facility])   OSA (obstructive sleep apnea)   Back pain   History of drug overdose    Hyperlipidemia   Essential hypertension    Time spent: 25 minutes.     Hara Milholland MD PhD  [facility] Pager [phone] If 7PM-7AM, please contact night-coverage at [email or web address], password [facility] 07/13/2015, 10:22 PM

## 2015-07-13 NOTE — Progress Notes (Addendum)
1 day lexiscan myoview completed without significant complication. Pending final result by [facility] radiology  Signed, Almyra Deforest PA Pager: [phone]

## 2015-07-13 NOTE — Progress Notes (Signed)
IMPRESSION: 1. No reversible ischemia or infarction.  2. Normal left ventricular wall motion.  3. Left ventricular ejection fraction 70%  4. Low-risk stress test findings*.  Will inform patient and Dr. Erlinda Hong.   Signed, Almyra Deforest PA Pager: [phone]

## 2015-07-13 NOTE — Progress Notes (Signed)
3 mins, Hao PA at bedside, pt continues to report "chest pressure." Continues to deny SOB

## 2015-07-13 NOTE — Progress Notes (Signed)
*  PRELIMINARY RESULTS* Vascular Ultrasound Lower extremity venous duplex has been completed.  Preliminary findings: No evidence of DVT or baker's cyst.   Landry Mellow, RDMS, RVT  07/13/2015, 2:26 PM

## 2015-07-13 NOTE — Progress Notes (Signed)
7 mins, pt denies any remaining "chest discomfort" continues to deny SOB. Test ended

## 2015-07-13 NOTE — Evaluation (Signed)
Physical Therapy Evaluation Patient Details Name: [name] MRN: [id number] DOB: 09-02-1940 Today's Date: 07/13/2015   History of Present Illness  Patient is a 75 y/o female with hx of lumbar laminectomy, bil TKA, ankle fx, depression and frequent falls presents with chest pain. Workup all negative.   Clinical Impression  Patient presents with decreased strength and balance deficits impacting mobility. Pt with falls at home. Discussed making adjustments in home to create a safer environment to help with decreasing falls and relying on assist from sons and neighbors more frequently. Pt having hard to allowing help. Tolerated ambulation with Min guard assist for safety. Would benefit from HHPT for safety evaluation and to improve strength, safety and mobility. Will follow acutely.     Follow Up Recommendations Home health PT;Supervision - Intermittent    Equipment Recommendations  None recommended by PT    Recommendations for Other Services OT consult     Precautions / Restrictions Precautions Precautions: Fall Restrictions Weight Bearing Restrictions: No      Mobility  Bed Mobility Overal bed mobility: Needs Assistance Bed Mobility: Supine to Sit     Supine to sit: Modified independent (Device/Increase time);HOB elevated     General bed mobility comments: Incr time.   Transfers Overall transfer level: Needs assistance Equipment used: Rolling walker (2 wheeled) Transfers: Sit to/from Stand Sit to Stand: Min guard         General transfer comment: Min guard for safety.   Ambulation/Gait Ambulation/Gait assistance: Min guard Ambulation Distance (Feet): 150 Feet Assistive device: Rolling walker (2 wheeled) Gait Pattern/deviations: Step-through pattern;Decreased stride length;Trunk flexed Gait velocity: decr   General Gait Details: Cues for RW proximity and safety. No LOB.  Stairs            Wheelchair Mobility    Modified Rankin (Stroke Patients  Only)       Balance Overall balance assessment: Needs assistance Sitting-balance support: Feet supported;No upper extremity supported Sitting balance-Leahy Scale: Good     Standing balance support: During functional activity Standing balance-Leahy Scale: Fair                               Pertinent Vitals/Pain Pain Assessment: No/denies pain    Home Living Family/patient expects to be discharged to:: Private residence Living Arrangements: Alone Available Help at Discharge: Family;Neighbor Type of Home: House Home Access: Stairs to enter Entrance Stairs-Rails: None Entrance Stairs-Number of Steps: 1 threshold  Home Layout: One level Home Equipment: Environmental consultant - 2 wheels;Walker - 4 wheels;Shower seat;Shower seat - built in;Wheelchair - manual;Toilet riser Additional Comments: life alert; pt has lift chair in bathtub    Prior Function Level of Independence: Independent with assistive device(s)         Comments: Sons have been helping with grocery shopping. Has someone assist with cleaning twice/week.      Hand Dominance   Dominant Hand: Right    Extremity/Trunk Assessment   Upper Extremity Assessment: Defer to OT evaluation           Lower Extremity Assessment: Generalized weakness         Communication   Communication: No difficulties  Cognition Arousal/Alertness: Awake/alert Behavior During Therapy:  (emotionally labile.) Overall Cognitive Status: Within Functional Limits for tasks assessed                      General Comments General comments (skin integrity, edema, etc.): Lengthy discussion about creating  safe home environment and reasons for falls. Recommended sons/HHPT moving around furniture to create more open floor plan; adjusing bathroom setup etc.     Exercises        Assessment/Plan    PT Assessment Patient needs continued PT services  PT Diagnosis Difficulty walking   PT Problem List Decreased strength;Decreased  activity tolerance;Decreased balance;Decreased mobility;Decreased safety awareness  PT Treatment Interventions Balance training;Functional mobility training;Therapeutic activities;Therapeutic exercise;Patient/family education;Stair training;Gait training   PT Goals (Current goals can be found in the Care Plan section) Acute Rehab PT Goals Patient Stated Goal: to return to independence PT Goal Formulation: With patient Time For Goal Achievement: 07/27/15 Potential to Achieve Goals: Good    Frequency Min 3X/week   Barriers to discharge Decreased caregiver support lives alone    Co-evaluation               End of Session Equipment Utilized During Treatment: Gait belt Activity Tolerance: Patient tolerated treatment well Patient left: in bed;with call bell/phone within reach Nurse Communication: Mobility status    Functional Assessment Tool Used: clinical judgment Functional Limitation: Mobility: Walking and moving around Mobility: Walking and Moving Around Current Status [id number]): At least 1 percent but less than 20 percent impaired, limited or restricted Mobility: Walking and Moving Around Goal Status [phone]): At least 1 percent but less than 20 percent impaired, limited or restricted    Time: 1625-1640 PT Time Calculation (min) (ACUTE ONLY): 15 min   Charges:   PT Evaluation $PT Eval Moderate Complexity: 1 Procedure     PT G Codes:   PT G-Codes **NOT FOR INPATIENT CLASS** Functional Assessment Tool Used: clinical judgment Functional Limitation: Mobility: Walking and moving around Mobility: Walking and Moving Around Current Status [id number]): At least 1 percent but less than 20 percent impaired, limited or restricted Mobility: Walking and Moving Around Goal Status [phone]): At least 1 percent but less than 20 percent impaired, limited or restricted    [place] 07/13/2015, 5:03 PM  Wray Kearns, [place], DPT [phone]

## 2015-07-13 NOTE — Telephone Encounter (Signed)
Did they get to meet with [facility] home [facility] yet?   Use salonpas otc patches with lidocaine in them for her pain.  That's the only other safe alternative idea I have at this point.

## 2015-07-14 DIAGNOSIS — I209 Angina pectoris, unspecified: Secondary | ICD-10-CM | POA: Diagnosis not present

## 2015-07-14 DIAGNOSIS — N183 Chronic kidney disease, stage 3 (moderate): Secondary | ICD-10-CM

## 2015-07-14 DIAGNOSIS — I10 Essential (primary) hypertension: Secondary | ICD-10-CM | POA: Diagnosis not present

## 2015-07-14 DIAGNOSIS — R079 Chest pain, unspecified: Secondary | ICD-10-CM | POA: Diagnosis not present

## 2015-07-14 DIAGNOSIS — N179 Acute kidney failure, unspecified: Secondary | ICD-10-CM | POA: Diagnosis not present

## 2015-07-14 DIAGNOSIS — E785 Hyperlipidemia, unspecified: Secondary | ICD-10-CM | POA: Diagnosis not present

## 2015-07-14 LAB — BASIC METABOLIC PANEL
ANION GAP: 12 (ref 5–15)
BUN: 12 mg/dL (ref 6–20)
CHLORIDE: 102 mmol/L (ref 101–111)
CO2: 24 mmol/L (ref 22–32)
Calcium: 9.5 mg/dL (ref 8.9–10.3)
Creatinine, Ser: 0.77 mg/dL (ref 0.44–1.00)
GFR calc non Af Amer: >60 mL/min (ref >60)
Glucose, Bld: 127 mg/dL — ABNORMAL HIGH (ref 65–99)
POTASSIUM: 4.3 mmol/L (ref 3.5–5.1)
Sodium: 138 mmol/L (ref 135–145)

## 2015-07-14 LAB — CBC
HEMATOCRIT: 40.5 % (ref 36.0–46.0)
HEMOGLOBIN: 13.2 g/dL (ref 12.0–15.0)
MCH: 31 pg (ref 26.0–34.0)
MCHC: 32.6 g/dL (ref 30.0–36.0)
MCV: 95.1 fL (ref 78.0–100.0)
Platelets: 311 10*3/uL (ref 150–400)
RBC: 4.26 MIL/uL (ref 3.87–5.11)
RDW: 14.5 % (ref 11.5–15.5)
WBC: 10 10*3/uL (ref 4.0–10.5)

## 2015-07-14 MED ORDER — ACETAMINOPHEN 500 MG PO TABS: 500.0000 mg | ORAL_TABLET | Freq: Three times a day (TID) | ORAL | Status: DC

## 2015-07-14 MED ORDER — METOPROLOL TARTRATE 25 MG PO TABS: 12.5000 mg | ORAL_TABLET | Freq: Two times a day (BID) | ORAL | Status: DC

## 2015-07-14 MED ORDER — ASPIRIN EC 81 MG PO TBEC: 81.0000 mg | DELAYED_RELEASE_TABLET | Freq: Every day | ORAL | Status: DC

## 2015-07-14 NOTE — Care Management Note (Signed)
Case Management Note Marvetta Gibbons RN, BSN Unit 2W-Case Manager [phone]  Patient Details  Name: [name] MRN: [id number] Date of Birth: 11/03/1940  Subjective/Objective:  Pt admitted with chest pain for stress test 07/13/15                  Action/Plan: PTA pt lived at home alone- was active with [facility] for [facility] services- RN/PT/OT/SW- will need resumption orders on discharge for HH-RN/PT/OT/SW, Manuela Schwartz with [facility] following for discharge  Expected Discharge Date:      07/14/15            Expected Discharge Plan:  [place]  In-House Referral:     Discharge planning Services  CM Consult  Post Acute Care Choice:  Home Health, Resumption of Svcs/PTA Provider Choice offered to:  Patient  DME Arranged:    DME Agency:     HH Arranged:  RN, PT, OT, Social Work [employer] Agency:  [place] [place]  Status of Service:  Completed, signed off  Medicare Important Message Given:    Date Medicare IM Given:    Medicare IM give by:    Date Additional Medicare IM Given:    Additional Medicare Important Message give by:     If discussed at [place] of Stay Meetings, dates discussed:    Additional Comments:  07/14/15- pt for d/c today- [place] orders placed- spoke with Manuela Schwartz with [facility] regarding resumption of [place] services.   Dawayne Patricia, RN 07/14/2015, 11:49 AM

## 2015-07-14 NOTE — Discharge Summary (Signed)
Discharge Summary  [name] [id number] DOB: April 03, 1941  PCP: Hollace Kinnier, DO  Admit date: 07/11/2015 Discharge date: 07/14/2015  Time spent: <71mins  Recommendations for Outpatient Follow-up:  1. F/u with PMD in two weeks, pmd to continue adjust blood pressure meds, repeat bmp at follow up  Discharge Diagnoses:  Active Hospital Problems   Diagnosis Date Noted  . Chest pain 07/11/2015  . Essential hypertension 07/07/2015  . Hyperlipidemia 07/07/2015  . History of drug overdose 07/07/2015  . Back pain 06/20/2015  . OSA (obstructive sleep apnea) 09/17/2014  . Major depressive disorder, recurrent severe without psychotic features ([place]) 08/07/2014  . Depression   . TIA (transient ischemic attack) 10/12/2011    Resolved Hospital Problems   Diagnosis Date Noted Date Resolved  No resolved problems to display.    Discharge Condition: stable  Diet recommendation: heart healthy  Filed Weights   07/13/15 0359  Weight: 88.4 kg (194 lb 14.2 oz)    History of present illness:  [name] is a 75 y.o. female with a history of hypertension, hyperlipidemia, CKD and TIAs, and recently hospitalized for acute toxic encephalopathy due to sedatives and dehydration from 1/27 through 07/04/2015,presenting to the emergency department with chest pain. She awoke due to acute onset of left-sided, "under the breast", sharp chest pain, without nausea or diaphoresis. Pain was not worsened with deep inspiration, movement or exertion.She denies any dizziness. She denies any prior similar events. She denies any history of CAD. She reports headaches without vision changes. No confusion is reported. She denies any leg swelling or calf pain. EMS was called. She received ASA 324 mg and two sublingual NTG, IV Zofran in rout to the hospital without relief, until MSO4 was given. At the emergency department, her EKG was essentially unremarkable when compared to prior. Her CMET and CBC are normal. Her  troponins are negative to date. Last 2 D Echo on 2013 was normal We will admit for further observation.  Hospital Course:  Principal Problem:   Chest pain Active Problems:   TIA (transient ischemic attack)   Depression   Major depressive disorder, recurrent severe without psychotic features (H[facility])   OSA (obstructive sleep apnea)   Back pain   History of drug overdose   Hyperlipidemia   Essential hypertension  Chest pain, No prior cardiac history. Risk factors include HLD, HTN, FH, obesity), - A1c 6.2 & lipid panel : ldl 50, triglyceride 152 - Daily aspirin, statin, lopressor started this hospitalization Cardiology consulted. -echocardiogram: grade I diastolic dysfunction, normal LVEF, no wall motion abnormalities, low risk stress test, cardiology cleared patient to be discharged home  Elevated D dimer: V-Q scan low probability. negative Doppler LE.   Acute kidney injury on Chronic kidney disease stage II-III baseline creatinine 0.8-0.9 Current Cr 1.25 on admission, cr 0.77 at discharge, hctz discontinued due to tendency of dehydration. Lisinopril resumed at discharge -Minimize nephrotoxins and renally dose medications  Hypertension Patient reported she only use  Clonidine prn  lisinopril and hctz  held due to mild increase cr.  Lisinopril restarted at discharge, hctz stopped due to tendency of dehydration. Lopressor started per cardiology recommendation, patient to continue monitor blood pressure at home, further blood pressure meds titration per pmd.  Hyperlipidemia Will continue Lipitor   History of TIAs. (MRI brain this hospitalization with chronic small vessel ischemic disease) No new events during this hospitalization Continue ASA at 81mg  po qd.  Chronic pain/ Peripheral Neuropathy.  She received 1 dose of MSO4 56m IV at  the ED with some relief of her chronic back pain Will continue her home meds with Tylenol and Neurontin. Tramadol as non narcotic  alternative Patient did have history of altered mental status due to sedative meds that required hospitalization on 06/2015.  Headaches. She has a h/o chronic headaches. Patient denies worsening symptoms.  prn Imitrex held initially due to concerns of  Angina, low risk stress test, prn imitrex restarted at discharge  H/o frequent falls:  home health arranged  Anxiety/depression: continue home meds. Mood stable at discharge.  Code Status: Full code.  Family Communication:care discussed with patient. Patient is a retired Occupational hygienist. Disposition Plan: home with home health   Consultants:  Cardiology  Procedures:  Lower extremity Doppler;  ECHO  Cardiac stress test   Antibiotics: None  Discharge Exam: BP 160/86 mmHg  Pulse 94  Temp(Src) 98.4 F (36.9 C) (Oral)  Resp 16  Ht 5\' 5"  (1.651 m)  Wt 88.4 kg (194 lb 14.2 oz)  BMI 32.43 kg/m2  SpO2 97%   General: NAD  Cardiovascular: S 1, S 2 RRR  Respiratory: CTA  Abdomen: BS present, soft ,nt  Musculoskeletal: no edema   Discharge Instructions You were cared for by a hospitalist during your hospital stay. If you have any questions about your discharge medications or the care you received while you were in the hospital after you are discharged, you can call the unit and asked to speak with the hospitalist on call if the hospitalist that took care of you is not available. Once you are discharged, your primary care physician will handle any further medical issues. Please note that NO REFILLS for any discharge medications will be authorized once you are discharged, as it is imperative that you return to your primary care physician (or establish a relationship with a primary care physician if you do not have one) for your aftercare needs so that they can reassess your need for medications and monitor your lab values.  Discharge Instructions    Diet - low sodium heart healthy    Complete by:  As directed       Face-to-face encounter (required for Medicare/Medicaid patients)    Complete by:  As directed   I Jaccob Czaplicki certify that this patient is under my care and that I, or a nurse practitioner or physician's assistant working with me, had a face-to-face encounter that meets the physician face-to-face encounter requirements with this patient on 07/14/2015. The encounter with the patient was in whole, or in part for the following medical condition(s) which is the primary reason for home health care (List medical condition): FTt/ frequent falls  The encounter with the patient was in whole, or in part, for the following medical condition, which is the primary reason for home health care:  FTt/ freqent falls  I certify that, based on my findings, the following services are medically necessary home health services:   Nursing Physical therapy    Reason for Medically Necessary Home Health Services:  Skilled Nursing- Change/Decline in Patient Status  My clinical findings support the need for the above services:  Unsafe ambulation due to balance issues  Further, I certify that my clinical findings support that this patient is homebound due to:  Unsafe ambulation due to balance issues     Home Health    Complete by:  As directed   To provide the following care/treatments:   PT OT RN Social work       Increase activity slowly  Complete by:  As directed             Medication List    TAKE these medications        acetaminophen 500 MG tablet  Commonly known as:  TYLENOL  Take 1 tablet (500 mg total) by mouth 3 (three) times daily.     aspirin 325 MG tablet  Take 1 tablet (325 mg total) by mouth daily. For heart health     aspirin EC 81 MG tablet  Take 1 tablet (81 mg total) by mouth daily.     atorvastatin 40 MG tablet  Commonly known as:  LIPITOR  Take one tablet by mouth once daily for cholesterol     Camphor-Menthol-Methyl Sal 1.2-5.7-6.3 % Ptch  Commonly known as:  SALONPAS  Use Patch as  directed for pain     cholecalciferol 1000 units tablet  Commonly known as:  VITAMIN D  Take 1 tablet (1,000 Units total) by mouth daily. For bone health     cloNIDine 0.1 MG tablet  Commonly known as:  CATAPRES  Take if SBP is greater than 160     docusate sodium 100 MG capsule  Commonly known as:  COLACE  Take 1 capsule (100 mg total) by mouth daily. For constipation     gabapentin 400 MG capsule  Commonly known as:  NEURONTIN  Take 1 capsule (400 mg total) by mouth 4 (four) times daily. For pain management/agitation     lisinopril 5 MG tablet  Commonly known as:  PRINIVIL,ZESTRIL  Take 1 tablet (5 mg total) by mouth daily. For high blood pressure     metoprolol tartrate 25 MG tablet  Commonly known as:  LOPRESSOR  Take 0.5 tablets (12.5 mg total) by mouth 2 (two) times daily.     potassium chloride 10 MEQ tablet  Commonly known as:  K-DUR,KLOR-CON  Take 1 tablet (10 mEq total) by mouth daily. For low potassium     pyridOXINE 50 MG tablet  Commonly known as:  VITAMIN B-6  Take 1 tablet (50 mg total) by mouth daily. For Vitamin B-6 supplement     sertraline 50 MG tablet  Commonly known as:  ZOLOFT  Take 2 tablets (100 mg total) by mouth daily.     sodium chloride 0.65 % Soln nasal spray  Commonly known as:  OCEAN  Place 1 spray into both nostrils as needed for congestion.     SUMAtriptan 50 MG tablet  Commonly known as:  IMITREX  Take 50 mg by mouth as needed for migraine or headache       Allergies  Allergen Reactions  . Contrast Media [Iodinated Diagnostic Agents] Other (See Comments)    Respiratory failure  . Acyclovir And Related Other (See Comments)    unknown  . Sulfa Drugs Cross Reactors Nausea And Vomiting       Follow-up Information    Follow up with [facility].   Why:  Resumption of HH-RN/PT/OT/SW   Contact information:   [address] [place] [address] [phone]       Follow up with Quay Burow, MD.    Specialties:  Cardiology, Radiology   Why:  As needed   Contact information:   [address] [place] [address] [phone]       Follow up with [name], [name], DO In 2 weeks.   Specialty:  Geriatric Medicine   Why:  hospital discharge follow up, pmd to continue adjust blood pressure  control, repeat bmp at follow up   Contact information:   [place] [place] [address] [phone]        The results of significant diagnostics from this hospitalization (including imaging, microbiology, ancillary and laboratory) are listed below for reference.    Significant Diagnostic Studies: Dg Chest 2 View  07/11/2015  CLINICAL DATA:  Pain under LEFT breast with nausea and vomiting. EXAM: CHEST  2 VIEW COMPARISON:  Chest radiograph July 01, 2015 FINDINGS: The cardiac silhouette is mildly enlarged, mediastinal silhouette is nonsuspicious. No pleural effusion or focal consolidation. Similar blunting of the RIGHT costophrenic angle compatible with pleural thickening. No pneumothorax. Soft tissue planes and included osseous structures are nonsuspicious. Surgical clips in the included right abdomen compatible with cholecystectomy. IMPRESSION: Mild cardiomegaly, no acute pulmonary process. Electronically Signed   By: Elon Alas M.D.   On: 07/11/2015 05:41   Dg Chest 2 View  07/01/2015  CLINICAL DATA:  Altered mental status for 1 week. Initial encounter. EXAM: CHEST  2 VIEW COMPARISON:  PA and lateral chest 10/11/2011.  CT chest 09/17/2014. FINDINGS: Elevation of the right hemidiaphragm is unchanged. Lungs are clear. There is cardiomegaly. No pneumothorax or pleural effusion. IMPRESSION: Cardiomegaly without acute disease. Electronically Signed   By: Inge Rise M.D.   On: 07/01/2015 18:19   Ct Head Wo Contrast  07/01/2015  CLINICAL DATA:  Increasing falls, hallucinations, and altered mental status. EXAM: CT HEAD WITHOUT CONTRAST CT CERVICAL SPINE WITHOUT CONTRAST  TECHNIQUE: Multidetector CT imaging of the head and cervical spine was performed following the standard protocol without intravenous contrast. Multiplanar CT image reconstructions of the cervical spine were also generated. COMPARISON:  11/05/2014 FINDINGS: CT HEAD FINDINGS Mild cerebral atrophy. No ventricular dilatation. Patchy low-attenuation changes in the deep white matter consistent with small vessel ischemia. No mass effect or midline shift. No abnormal extra-axial fluid collections. Gray-white matter junctions are distinct. Basal cisterns are not effaced. No evidence of acute intracranial hemorrhage. No depressed skull fractures. Mild mucosal thickening in the paranasal sinuses. No acute air-fluid levels. Mastoid air cells are not opacified. Vascular calcifications. CT CERVICAL SPINE FINDINGS Postoperative changes with anterior plate and screw fixation and intervertebral fusion from C5 through C7. There is discontinuity of the fixation plate between C5 and C6 but this appearance is unchanged since previous study. Bony fusion appears intact. Normal alignment of the cervical spine. No vertebral compression deformities. No prevertebral soft tissue swelling. C1-2 articulation appears intact. Degenerative changes throughout the facet joints. No focal bone lesion or bone destruction. The upper esophagus is mildly distended and contains gas and presumably ingested material. Dysmotility or distal esophageal stricture not excluded. IMPRESSION: No acute intracranial abnormalities. Anterior plate and screw fixation with intervertebral fusion from C5 through C7. Fracture of the hardware plate from [id number], unchanged since previous studies. Degenerative changes in the cervical spine. No acute displaced fractures identified. Incidental note of mild dilatation and residual material in the upper esophagus. Esophageal dysmotility or distal stricture not excluded. Electronically Signed   By: Lucienne Capers M.D.   On:  07/01/2015 18:46   Ct Cervical Spine Wo Contrast  07/01/2015  CLINICAL DATA:  Increasing falls, hallucinations, and altered mental status. EXAM: CT HEAD WITHOUT CONTRAST CT CERVICAL SPINE WITHOUT CONTRAST TECHNIQUE: Multidetector CT imaging of the head and cervical spine was performed following the standard protocol without intravenous contrast. Multiplanar CT image reconstructions of the cervical spine were also generated. COMPARISON:  11/05/2014 FINDINGS: CT HEAD FINDINGS Mild cerebral atrophy. No  ventricular dilatation. Patchy low-attenuation changes in the deep white matter consistent with small vessel ischemia. No mass effect or midline shift. No abnormal extra-axial fluid collections. Gray-white matter junctions are distinct. Basal cisterns are not effaced. No evidence of acute intracranial hemorrhage. No depressed skull fractures. Mild mucosal thickening in the paranasal sinuses. No acute air-fluid levels. Mastoid air cells are not opacified. Vascular calcifications. CT CERVICAL SPINE FINDINGS Postoperative changes with anterior plate and screw fixation and intervertebral fusion from C5 through C7. There is discontinuity of the fixation plate between C5 and C6 but this appearance is unchanged since previous study. Bony fusion appears intact. Normal alignment of the cervical spine. No vertebral compression deformities. No prevertebral soft tissue swelling. C1-2 articulation appears intact. Degenerative changes throughout the facet joints. No focal bone lesion or bone destruction. The upper esophagus is mildly distended and contains gas and presumably ingested material. Dysmotility or distal esophageal stricture not excluded. IMPRESSION: No acute intracranial abnormalities. Anterior plate and screw fixation with intervertebral fusion from C5 through C7. Fracture of the hardware plate from [id number], unchanged since previous studies. Degenerative changes in the cervical spine. No acute displaced fractures  identified. Incidental note of mild dilatation and residual material in the upper esophagus. Esophageal dysmotility or distal stricture not excluded. Electronically Signed   By: Lucienne Capers M.D.   On: 07/01/2015 18:46   Mr Brain Wo Contrast  07/02/2015  CLINICAL DATA:  Initial evaluation for acute encephalopathy. EXAM: MRI HEAD WITHOUT CONTRAST TECHNIQUE: Multiplanar, multiecho pulse sequences of the brain and surrounding structures were obtained without intravenous contrast. COMPARISON:  Prior CT from 07/01/2015. FINDINGS: Mild diffuse prominence of the CSF containing spaces is compatible with generalized age-related cerebral atrophy. Scattered patchy T2/FLAIR hyperintensity within the periventricular and deep white matter most consistent with chronic small vessel ischemic disease, mild for age. Minimal small vessel type changes present within the pons. No abnormal foci of restricted diffusion to suggest acute intracranial infarct. Gray-white matter differentiation maintained. Normal intravascular flow voids preserved. No acute or chronic intracranial hemorrhage. No areas of chronic infarction. No mass lesion, midline shift, or mass effect. No hydrocephalus. No extra-axial fluid collection. Craniocervical junction within normal limits. Visualized upper cervical spine unremarkable. Pituitary gland normal.  No acute abnormality about the orbits. Mild mucosal thickening within the paranasal sinuses. No air-fluid levels to suggest active sinus infection. Trace opacity within the mastoid air cells. Inner ear structures grossly normal. Bone marrow signal intensity within normal limits. No scalp soft tissue abnormality. IMPRESSION: 1. No acute intracranial infarct or other process identified. 2. Mild age-related cerebral atrophy with chronic small vessel ischemic disease. Electronically Signed   By: Jeannine Boga M.D.   On: 07/02/2015 02:22   Nm Myocar Multi W/spect W/wall Motion / Ef  07/13/2015   CLINICAL DATA:  LEFT chest pressure.  75 year old female. EXAM: MYOCARDIAL IMAGING WITH SPECT (REST AND PHARMACOLOGIC-STRESS) GATED LEFT VENTRICULAR WALL MOTION STUDY LEFT VENTRICULAR EJECTION FRACTION TECHNIQUE: Standard myocardial SPECT imaging was performed after resting intravenous injection of 10 mCi Tc-108m sestamibi. Subsequently, intravenous infusion of Lexiscan was performed under the supervision of the Cardiology staff. At peak effect of the drug, 30 mCi Tc-106m sestamibi was injected intravenously and standard myocardial SPECT imaging was performed. Quantitative gated imaging was also performed to evaluate left ventricular wall motion, and estimate left ventricular ejection fraction. COMPARISON:  None. FINDINGS: Perfusion: No decreased activity in the left ventricle on stress imaging to suggest reversible ischemia or infarction. Wall Motion: Normal left ventricular wall motion. No  left ventricular dilation. Left Ventricular Ejection Fraction: 70 % End diastolic volume 47 ml End systolic volume 14 ml IMPRESSION: 1. No reversible ischemia or infarction. 2. Normal left ventricular wall motion. 3. Left ventricular ejection fraction 70% 4. Low-risk stress test findings*. *2012 Appropriate Use Criteria for Coronary Revascularization Focused Update: J Am Coll Cardiol. [id number]. http://content.airportbarriers.com.aspx?articleid=[email or web address] Electronically Signed   By: Suzy Bouchard M.D.   On: 07/13/2015 14:41   Nm Pulmonary Perf And Vent  07/11/2015  CLINICAL DATA:  Chest pain with elevated D-dimer levels. Evaluate for pulmonary embolism. EXAM: NUCLEAR MEDICINE VENTILATION - PERFUSION LUNG SCAN TECHNIQUE: Ventilation images were obtained in multiple projections using inhaled aerosol Tc-49m DTPA. Perfusion images were obtained in multiple projections after intravenous injection of Tc-78m MAA. RADIOPHARMACEUTICALS:  30.1 Technetium-64m DTPA aerosol inhalation and 4.1 Technetium-32m MAA IV COMPARISON:   Radiographs 07/11/2015. FINDINGS: Ventilation: No focal ventilation defect. Some activity is noted within the esophagus and stomach. Perfusion: No wedge shaped peripheral perfusion defects to suggest acute pulmonary embolism. IMPRESSION: Very low probability for acute pulmonary embolism. Electronically Signed   By: Richardean Sale M.D.   On: 07/11/2015 15:56    Microbiology: No results found for this or any previous visit (from the past 240 hour(s)).   Labs: Basic Metabolic Panel:  Recent Labs Lab 07/11/15 0511 07/12/15 0351 07/14/15 0635  NA 138 138 138  K 3.4* 3.5 4.3  CL 102 100* 102  CO2 18* 24 24  GLUCOSE 145* 116* 127*  BUN 13 21* 12  CREATININE 1.08* 1.25* 0.77  CALCIUM 9.4 9.4 9.5   Liver Function Tests:  Recent Labs Lab 07/11/15 0511  AST 24  ALT 10*  ALKPHOS 92  BILITOT 1.0  PROT 6.8  ALBUMIN 3.6    Recent Labs Lab 07/11/15 0511  LIPASE 23   No results for input(s): AMMONIA in the last 168 hours. CBC:  Recent Labs Lab 07/11/15 0511 07/12/15 0351 07/14/15 0635  WBC 11.8* 11.4* 10.0  NEUTROABS 6.9  --   --   HGB 13.5 12.9 13.2  HCT 41.7 40.7 40.5  MCV 94.3 96.0 95.1  PLT 355 349 311   Cardiac Enzymes:  Recent Labs Lab 07/11/15 0511 07/11/15 1009 07/11/15 1315 07/11/15 1606  TROPONINI <0.03 <0.03 <0.03 <0.03   BNP: BNP (last 3 results) No results for input(s): BNP in the last 8760 hours.  ProBNP (last 3 results) No results for input(s): PROBNP in the last 8760 hours.  CBG: No results for input(s): GLUCAP in the last 168 hours.     SignedFlorencia Reasons MD, PhD  [facility] 07/14/2015, 10:47 AM

## 2015-07-14 NOTE — Progress Notes (Signed)
Physical Therapy Treatment Patient Details Name: [name] MRN: [id number] DOB: [birth year] Today's Date: 07/14/2015    History of Present Illness Patient is a 74 y/o female with hx of lumbar laminectomy, bil TKA, ankle fx, depression and frequent falls presents with chest pain. Workup all negative.     PT Comments    Patient progressing well towards PT goals. Improved ambulation distance today with less fatigue. Continues to demonstrate balance deficits with higher level balance challenges putting pt at increased risk for falls. Appropriate for HHPT to address safety and balance at home. Will follow acutely.    Follow Up Recommendations  Home health PT;Supervision - Intermittent     Equipment Recommendations  None recommended by PT    Recommendations for Other Services       Precautions / Restrictions Precautions Precautions: Fall Restrictions Weight Bearing Restrictions: No    Mobility  Bed Mobility Overal bed mobility: Needs Assistance Bed Mobility: Supine to Sit     Supine to sit: Modified independent (Device/Increase time);HOB elevated     General bed mobility comments: Incr time.   Transfers Overall transfer level: Needs assistance Equipment used: Rolling walker (2 wheeled) Transfers: Sit to/from Stand Sit to Stand: Supervision         General transfer comment: Supervision for safety.   Ambulation/Gait Ambulation/Gait assistance: Min guard Ambulation Distance (Feet): 300 Feet Assistive device: Rolling walker (2 wheeled) Gait Pattern/deviations: Step-through pattern;Decreased stride length;Drifts right/left Gait velocity: decr Gait velocity interpretation: Below normal speed for age/gender General Gait Details: Cues for RW proximity and safety. Tolerated higher level balance challenges with mild deviations in gait but no LOB. HR up to 112 bpm. 1/4 DOE.   Stairs            Wheelchair Mobility    Modified Rankin (Stroke Patients Only)        Balance Overall balance assessment: Needs assistance Sitting-balance support: Feet supported;No upper extremity supported Sitting balance-Leahy Scale: Good Sitting balance - Comments: Able to reach outside BoS and donn socks without difficulty.    Standing balance support: During functional activity Standing balance-Leahy Scale: Fair Standing balance comment: Able to perform ADls- brushing hair without UE support for short periods.             High level balance activites: Head turns;Sudden stops;Direction changes High Level Balance Comments: Tolerated above balance challenges - head turns, and stepping over objects with only mild deviations in gait but no LOB.    Cognition Arousal/Alertness: Awake/alert Behavior During Therapy: WFL for tasks assessed/performed Overall Cognitive Status: Within Functional Limits for tasks assessed (questionable safety awareness at times)                      Exercises Other Exercises Other Exercises: sit to stand x10 with cues for controlled descent.    General Comments        Pertinent Vitals/Pain Pain Assessment: Faces Faces Pain Scale: Hurts a little bit Pain Location: back- chronic Pain Descriptors / Indicators: Sore Pain Intervention(s): Monitored during session;Repositioned;Premedicated before session    Home Living                      Prior Function            PT Goals (current goals can now be found in the care plan section) Progress towards PT goals: Progressing toward goals    Frequency  Min 3X/week    PT Plan Current plan remains appropriate  Co-evaluation             End of Session Equipment Utilized During Treatment: Gait belt Activity Tolerance: Patient tolerated treatment well Patient left: in bed;with call bell/phone within reach     Time: 0828-0846 PT Time Calculation (min) (ACUTE ONLY): 18 min  Charges:  $Gait Training: 8-22 mins                    G Codes:       Tishawna Larouche A Elan Brainerd 07/14/2015, 9:08 AM Wray Kearns, PT, DPT [phone]

## 2015-07-14 NOTE — Progress Notes (Signed)
     Subjective:  No CP/SOB. MV low risk.  Objective:  Temp:  [98.4 F (36.9 C)-98.9 F (37.2 C)] 98.4 F (36.9 C) (02/09 0508) Pulse Rate:  [94-102] 94 (02/09 0508) Resp:  [16] 16 (02/09 0508) BP: (138-199)/(66-74) 138/74 mmHg (02/09 0508) SpO2:  [93 %-97 %] 97 % (02/09 0508) Weight change:   Intake/Output from previous day: 02/08 0701 - 02/09 0700 In: 120 [P.O.:120] Out: -   Intake/Output from this shift:    Physical Exam: General appearance: alert and no distress Neck: no adenopathy, no carotid bruit, no JVD, supple, symmetrical, trachea midline and thyroid not enlarged, symmetric, no tenderness/mass/nodules Lungs: clear to auscultation bilaterally Heart: regular rate and rhythm, S1, S2 normal, no murmur, click, rub or gallop Extremities: extremities normal, atraumatic, no cyanosis or edema  Lab Results: Results for orders placed or performed during the hospital encounter of 07/11/15 (from the past 48 hour(s))  CBC     Status: None   Collection Time: 07/14/15  6:35 AM  Result Value Ref Range   WBC 10.0 4.0 - 10.5 K/uL   RBC 4.26 3.87 - 5.11 MIL/uL   Hemoglobin 13.2 12.0 - 15.0 g/dL   HCT 40.5 36.0 - 46.0 %   MCV 95.1 78.0 - 100.0 fL   MCH 31.0 26.0 - 34.0 pg   MCHC 32.6 30.0 - 36.0 g/dL   RDW 14.5 11.5 - 15.5 %   Platelets 311 150 - 400 K/uL  Basic metabolic panel     Status: Abnormal   Collection Time: 07/14/15  6:35 AM  Result Value Ref Range   Sodium 138 135 - 145 mmol/L   Potassium 4.3 3.5 - 5.1 mmol/L    Comment: SLIGHT HEMOLYSIS   Chloride 102 101 - 111 mmol/L   CO2 24 22 - 32 mmol/L   Glucose, Bld 127 (H) 65 - 99 mg/dL   BUN 12 6 - 20 mg/dL   Creatinine, Ser 0.77 0.44 - 1.00 mg/dL   Calcium 9.5 8.9 - 10.3 mg/dL   GFR calc non Af Amer >60 >60 mL/min   GFR calc Af Amer >60 >60 mL/min    Comment: (NOTE) The eGFR has been calculated using the CKD EPI equation. This calculation has not been validated in all clinical situations. eGFR's persistently  <60 mL/min signify possible Chronic Kidney Disease.    Anion gap 12 5 - 15    Imaging: Imaging results have been reviewed  Tele- NSR  Assessment/Plan:   1. Principal Problem: 2.   Chest pain 3. Active Problems: 4.   TIA (transient ischemic attack) 5.   Depression 6.   Major depressive disorder, recurrent severe without psychotic features ([place]) 7.   OSA (obstructive sleep apnea) 8.   Back pain 9.   History of drug overdose 10.   Hyperlipidemia 11.   Essential hypertension 12.   Time Spent Directly with Patient:  20 minutes  Length of Stay:   W/U neg. No further Sx. CTA neg. Enx neg and MV neg/ low risk. Exam benign. LAbs OK. Can be D/Cd home today from our point of view.  Quay Burow 07/14/2015, 10:09 AM

## 2015-07-16 DIAGNOSIS — N189 Chronic kidney disease, unspecified: Secondary | ICD-10-CM | POA: Diagnosis not present

## 2015-07-16 DIAGNOSIS — K219 Gastro-esophageal reflux disease without esophagitis: Secondary | ICD-10-CM | POA: Diagnosis not present

## 2015-07-16 DIAGNOSIS — G4733 Obstructive sleep apnea (adult) (pediatric): Secondary | ICD-10-CM | POA: Diagnosis not present

## 2015-07-16 DIAGNOSIS — G629 Polyneuropathy, unspecified: Secondary | ICD-10-CM | POA: Diagnosis not present

## 2015-07-16 DIAGNOSIS — M545 Low back pain: Secondary | ICD-10-CM | POA: Diagnosis not present

## 2015-07-16 DIAGNOSIS — G4700 Insomnia, unspecified: Secondary | ICD-10-CM | POA: Diagnosis not present

## 2015-07-16 DIAGNOSIS — M25552 Pain in left hip: Secondary | ICD-10-CM | POA: Diagnosis not present

## 2015-07-16 DIAGNOSIS — I129 Hypertensive chronic kidney disease with stage 1 through stage 4 chronic kidney disease, or unspecified chronic kidney disease: Secondary | ICD-10-CM | POA: Diagnosis not present

## 2015-07-16 DIAGNOSIS — F332 Major depressive disorder, recurrent severe without psychotic features: Secondary | ICD-10-CM | POA: Diagnosis not present

## 2015-07-19 DIAGNOSIS — M545 Low back pain: Secondary | ICD-10-CM | POA: Diagnosis not present

## 2015-07-19 DIAGNOSIS — G4700 Insomnia, unspecified: Secondary | ICD-10-CM | POA: Diagnosis not present

## 2015-07-19 DIAGNOSIS — I129 Hypertensive chronic kidney disease with stage 1 through stage 4 chronic kidney disease, or unspecified chronic kidney disease: Secondary | ICD-10-CM | POA: Diagnosis not present

## 2015-07-19 DIAGNOSIS — K219 Gastro-esophageal reflux disease without esophagitis: Secondary | ICD-10-CM | POA: Diagnosis not present

## 2015-07-19 DIAGNOSIS — N189 Chronic kidney disease, unspecified: Secondary | ICD-10-CM | POA: Diagnosis not present

## 2015-07-19 DIAGNOSIS — G4733 Obstructive sleep apnea (adult) (pediatric): Secondary | ICD-10-CM | POA: Diagnosis not present

## 2015-07-19 DIAGNOSIS — M25552 Pain in left hip: Secondary | ICD-10-CM | POA: Diagnosis not present

## 2015-07-19 DIAGNOSIS — G629 Polyneuropathy, unspecified: Secondary | ICD-10-CM | POA: Diagnosis not present

## 2015-07-20 ENCOUNTER — Telehealth: Payer: Self-pay

## 2015-07-20 NOTE — Telephone Encounter (Signed)
Letter mailed to patient regarding overdue mammogram. [name] 

## 2015-07-21 ENCOUNTER — Encounter: Payer: Self-pay | Admitting: Internal Medicine

## 2015-07-22 ENCOUNTER — Ambulatory Visit (INDEPENDENT_AMBULATORY_CARE_PROVIDER_SITE_OTHER): Payer: Medicare Other | Admitting: Internal Medicine

## 2015-07-22 ENCOUNTER — Encounter: Payer: Self-pay | Admitting: Internal Medicine

## 2015-07-22 VITALS — BP 200/110 | HR 76 | Temp 98.7°F | Ht 65.0 in | Wt 197.0 lb

## 2015-07-22 DIAGNOSIS — M79671 Pain in right foot: Secondary | ICD-10-CM

## 2015-07-22 DIAGNOSIS — R079 Chest pain, unspecified: Secondary | ICD-10-CM

## 2015-07-22 DIAGNOSIS — I10 Essential (primary) hypertension: Secondary | ICD-10-CM | POA: Diagnosis not present

## 2015-07-22 DIAGNOSIS — G63 Polyneuropathy in diseases classified elsewhere: Secondary | ICD-10-CM

## 2015-07-22 DIAGNOSIS — F333 Major depressive disorder, recurrent, severe with psychotic symptoms: Secondary | ICD-10-CM | POA: Diagnosis not present

## 2015-07-22 MED ORDER — LISINOPRIL 5 MG PO TABS: 5.0000 mg | ORAL_TABLET | Freq: Every day | ORAL | Status: DC

## 2015-07-22 MED ORDER — METOPROLOL TARTRATE 25 MG PO TABS: 25.0000 mg | ORAL_TABLET | Freq: Two times a day (BID) | ORAL | Status: DC

## 2015-07-22 NOTE — Progress Notes (Signed)
Patient ID: [name], female   DOB: Jun 02, 1941, 75 y.o.   MRN: [id number]   Location: [place] Provider: Rexene Edison. Mariea [name], D.O., C.M.D.  Code Status: full code Goals of Care: Advanced Directive information Does patient have an advance directive?: No  Chief Complaint  Patient presents with  . Hospitalization Follow-up    07/11/15 to 07/14/15 for chest pains.  Here with son [name].    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  Chest pressure beneath left breast came on and was unrelenting.   Workup was negative for MI.  She was continued on asa, statin, and started on lopressor 12.5mg  po bid.  She was off lisinopril from her last hospitalization.  Echo showed only grade 1 diastolic dysfunction.  She had acute on chronic kidney disease with cr up to 1.25 from baseline 0.77.  She was rehydrated.  Had prn clonidine, but stopped using she says.  HCTZ was stopped due to her dehydration.  Home health was ordered again and when they came out, she says the PT said she didn't need therapy and then pt said she didn't need any home health (not true).  The discharge again mentions giving tramadol which clearly made her confused previously and I had stopped it--this time I added it as an intolerance so it doesn't get started again.    Blood pressure very high here.  Takes bp at home and it's been around 200, took clonidine 0.1mg  the first few times but it didn't help much.  She hasn't notified anyone of this until she came here today.    Wants her gabapentin increased again.  She spent most of the visit repeating again and again about how Dr. Jannifer Franklin put her on this and it works so well and she needs it, she needs it.  She thinks it's why her blood pressure is  high.  Salonpas with lidocaine does not help lateral part of right foot.  Reviewed again reasons why she cannot take higher doses of gabapentin based on her renal function, h/o confusion, falls with this.  [name] not using the tylenol.  Pain is lateral part of her foot.  No pain in the leg.  Says pain was always in that area even before she broke the right ankle. She reports that the gabapentin was not why she was confused and it was tramadol, but I have seen her at visits in the past on the higher doses of gabapentin and she has been quite confused.    Had been on carbidopa/levodopa for restless legs, but it was stopped during one of the recent hospitalizations--seems this was for the best as she is a bit more steady walking.    Has not yet been to her psychologist yet due to difficulty making appts.   Discussed importance of socialization.  Hasn't been to church since her husband passed and needs new church near her.  [name] tolerating the zoloft seemingly well.    Review of Systems:  Review of Systems  Constitutional: Negative for fever and chills.  HENT: Negative for congestion.   Eyes: Negative for blurred vision.       Glasses  Respiratory: Negative for shortness of breath.   Cardiovascular: Negative for chest pain and leg swelling.  Gastrointestinal: Negative for  abdominal pain, constipation, blood in stool and melena.  Genitourinary: Negative for dysuria.  Musculoskeletal: Positive for back pain.       Controlled with stretches she says, but then talks about how she needs epidural injections done; unsteady gait, not using walker unless reminded (says PT told her she didn't need it, but unsteady and clearly does need it)  Skin: Negative for rash.  Neurological: Positive for tingling and sensory change. Negative for headaches.       Pain right lateral foot only at present  Psychiatric/Behavioral: Positive for depression and memory loss.       Had been having confusion with meds, but  clear today; repeating herself, but seems to make a point, not b/c of forgettting    Past Medical History  Diagnosis Date  . GERD (gastroesophageal reflux disease)     hx pud '98  . Arthritis     djd  . Restless leg syndrome     takes Sinemet daily  . 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 Dysrhythmia     hx tachy palpitations  . Neuromuscular disorder ([place])     peripheral neuropathy, FEET AND LEGS  . Stroke [facility])     ? 6 months ago - tests okay - [facility]   . Cellulitis     10/11/11 hospitalized for cellulitis   . Hyperlipemia     takes Atorvastatin daily  . Peripheral neuropathy ([place])   . Elbow fracture, left April 2015  . Depression     takes Zoloft daily  . Vertigo     takes Meclizine daily as needed  . Insomnia     takes restoril nightly as needed  . Hypertension     takes HCTZ daily  . Peripheral neuropathy ([facility])     takes Gabapentin daily  . History of bronchitis     many yrs ago  . Headache(784.0)     migraines yrs ago-takes Imitrex daily  . Joint pain   . Joint swelling   . Chronic back pain     scoliosis/stenosis  . Anxiety     takes Ativan daily as needed  . Cataracts, bilateral     immature   . Depression     Past Surgical History  Procedure Laterality Date  . Cervical disc surgery x2  2011  . Rt carotid enarterectomy  2011  . Rt knee arthroscopy  '99  . Left knee arthroscopy  2001  . Hematoma evacuation  2011    s/p rt cea  . Joint replacement  '01    total knee replacement, LEFT  . Abdominal hysterectomy  1975    bil oophorectomy  . Back surgery   MANY YRS AGO    laminectomy x3  . Cholecystectomy  1993  . Total knee arthroplasty  09/13/2011    Procedure: TOTAL KNEE ARTHROPLASTY;  Surgeon: Magnus Sinning, MD;  Location: [facility];  Service: Orthopedics;  Laterality: Right;  . Right knee replacement      09/2011   . Carpal tunnel release  07/09/2012    Procedure: CARPAL TUNNEL RELEASE;  Surgeon: Magnus Sinning, MD;  Location: [facility]  [facility];  Service: Orthopedics;  Laterality: Left;  . Finger arthroplasty  07/09/2012    Procedure: FINGER ARTHROPLASTY;  Surgeon: Magnus Sinning, MD;  Location: [facility];  Service: Orthopedics;  Laterality: Left;  Interposition Arthroplasty [facility] Joint Thumb Left   . Lipoma excision  07/09/2012    Procedure:  EXCISION LIPOMA;  Surgeon: Magnus Sinning, MD;  Location: [facility];  Service: Orthopedics;  Laterality: Left;  Excision Lipoma Dorsum Left Wrist   . Orif ankle fracture Right 09/14/2014    FIBULA   . Orif ankle fracture Right 09/14/2014    Procedure: OPEN REDUCTION INTERNAL FIXATION (ORIF) RIGHT ANKLE FRACTURE/SYNDESMOSIS ;  Surgeon: Wylene Simmer, MD;  Location: [place];  Service: Orthopedics;  Laterality: Right;  . Colonosocpy    . Esophagogastroduodenoscopy    . Lumbar laminectomy with coflex 1 level Bilateral 04/19/2015    Procedure: LUMBAR TWO-THREE LUMBAR LAMINECTOMY WITH COFLEX ;  Surgeon: Kristeen Miss, MD;  Location: [place] [facility];  Service: Neurosurgery;  Laterality: Bilateral;  Bilateral L23 laminectomy and foraminotomy with coflex    Allergies  Allergen Reactions  . Contrast Media [Iodinated Diagnostic Agents] Other (See Comments)    Respiratory failure  . Acyclovir And Related Other (See Comments)    unknown  . Sulfa Drugs Cross Reactors Nausea And Vomiting      Medication List       This list is accurate as of: 07/22/15 10:13 AM.  Always use your most recent med list.               aspirin EC 81 MG tablet  Take 1 tablet (81 mg total) by mouth daily.     atorvastatin 40 MG tablet  Commonly known as:  LIPITOR  Take one tablet by mouth once daily for cholesterol     cholecalciferol 1000 units tablet  Commonly known as:  VITAMIN D  Take 1 tablet (1,000 Units total) by mouth daily. For bone health     cloNIDine 0.1 MG tablet  Commonly known as:  CATAPRES  Take if SBP is greater than 160     docusate sodium 100 MG capsule  Commonly known as:  COLACE  Take 1 capsule  (100 mg total) by mouth daily. For constipation     gabapentin 400 MG capsule  Commonly known as:  NEURONTIN  Take 1 capsule (400 mg total) by mouth 4 (four) times daily. For pain management/agitation     metoprolol tartrate 25 MG tablet  Commonly known as:  LOPRESSOR  Take 0.5 tablets (12.5 mg total) by mouth 2 (two) times daily.     pyridOXINE 50 MG tablet  Commonly known as:  VITAMIN B-6  Take 1 tablet (50 mg total) by mouth daily. For Vitamin B-6 supplement     sertraline 50 MG tablet  Commonly known as:  ZOLOFT  Take 2 tablets (100 mg total) by mouth daily.     sodium chloride 0.65 % Soln nasal spray  Commonly known as:  OCEAN  Place 1 spray into both nostrils as needed for congestion.     SUMAtriptan 50 MG tablet  Commonly known as:  IMITREX  Take 50 mg by mouth as needed for migraine or headache        Health Maintenance  Topic Date Due  . TETANUS/TDAP  01/26/1961  . COLONOSCOPY  01/27/1992  . ZOSTAVAX  01/26/2002  . DEXA SCAN  01/27/2007  . MAMMOGRAM  12/31/2009  . INFLUENZA VACCINE  01/03/2016  . PNA vac Low Risk Adult  Completed    Physical Exam: Filed Vitals:   07/22/15 0957  BP: 200/110  Pulse: 76  Temp: 98.7 F (37.1 C)  TempSrc: Oral  Height: 5\' 5"  (1.651 m)  Weight: 197 lb (89.359 kg)  SpO2: 97%   Body mass index is 32.78 kg/(m^2). Physical  Exam  Constitutional: [name] oriented to person, place, and time. She appears well-developed and well-nourished. No distress.  Cardiovascular: Normal rate, regular rhythm, normal heart sounds and intact distal pulses.   Pulmonary/Chest: Effort normal and breath sounds normal. No respiratory distress.  Abdominal: Bowel sounds are normal.  Musculoskeletal:  Unsteady gait, had to be reminded to use her walker b/c she was falling backwards; no tenderness of right side of foot  Neurological: [name] alert and oriented to person, place, and time. No cranial nerve deficit.  Skin: Skin is warm and dry.    Psychiatric:  Intermittently tearful, but smiling more of the time; laughing at times; mind seems clear today and she's making sense    Labs reviewed: Basic Metabolic Panel:  Recent Labs  09/19/14 0619  04/20/15 1325  06/18/15 0726  07/11/15 0511 07/12/15 0351 07/14/15 0635  NA 137  < > 137  < > 137  < > 138 138 138  K 2.7*  < > 3.7  < > 3.7  < > 3.4* 3.5 4.3  CL 92*  < > 99*  < > 95*  < > 102 100* 102  CO2 33*  < > 29  < > 28  < > 18* 24 24  GLUCOSE 150*  < > 159*  < > 107*  < > 145* 116* 127*  BUN 10  < > 15  < > 28*  < > 13 21* 12  CREATININE 0.77  < > 1.08*  < > 1.29*  < > 1.08* 1.25* 0.77  CALCIUM 9.6  < > 9.2  < > 9.6  < > 9.4 9.4 9.5  MG 1.7  --   --   --  2.0  --   --   --   --   TSH  --   --  0.890  --   --   --   --   --   --   < > = values in this interval not displayed. Liver Function Tests:  Recent Labs  07/01/15 2111 07/02/15 0544 07/11/15 0511  AST 53* 21 24  ALT 11* 10* 10*  ALKPHOS 109 99 92  BILITOT 1.4* 0.3 1.0  PROT 6.1* 5.4* 6.8  ALBUMIN 3.1* 2.6* 3.6    Recent Labs  07/11/15 0511  LIPASE 23   No results for input(s): AMMONIA in the last 8760 hours. CBC:  Recent Labs  07/01/15 2111  07/07/15 1044 07/11/15 0511 07/12/15 0351 07/14/15 0635  WBC 10.4  < > 8.3 11.8* 11.4* 10.0  NEUTROABS 7.0  --  4.2 6.9  --   --   HGB 12.3  < >  --  13.5 12.9 13.2  HCT 36.6  < > 35.5 41.7 40.7 40.5  MCV 95.1  < > 96 94.3 96.0 95.1  PLT 371  < > 216 355 349 311  < > = values in this interval not displayed. Lipid Panel:  Recent Labs  11/11/14 1135 06/18/15 0726 07/02/15 0544  CHOL 222* 218* 117  HDL 39* 40* 37*  LDLCALC 110* 137* 50  TRIG 364* 206* 152*  CHOLHDL 5.7* 5.5 3.2   Lab Results  Component Value Date   HGBA1C 6.2* 07/02/2015    Assessment/Plan 1. Chest pain, unspecified chest pain type -not felt to be cardiac based on nl ekg and enzymes, echo with only grade 1 diastolic dysfunction -resolved -she has not had more of  this  2. Right foot pain - chronic--says she  had it before her ankle surgery--unclear to me  - I wonder if she might benefit from a local injection to the right lateral ankle or foot/nerve block treatment at a pain center -SHE DOES NOT TOLERATE TRAMADOL OR OTHER NARCOTIC PAIN MEDS WELL AND HAS A HISTORY OF TAKING TOO MUCH PAIN MEDICATION -RENAL FUNCTION PROHIBITS UPTITRATION OF HER GABAPENTIN AND ANY USE OF NSAIDS - Ambulatory referral to Pain Clinic placed today  3. 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 Severe episode of recurrent major depressive disorder, with psychotic features ([place]) -improved on the zoloft at this point--may need higher dosage--await input from Dr. Geoffry Paradise on this  -avoid sedating meds for her and benzos that will only increase her risk of OD or delirium  5. Uncontrolled hypertension -increase lopressor to 25mg  po bid -resume lisinopril 5mg  po daily -she has stopped prn clonidine -her son, [name], will call Monday if pt's bps have remained over 0000000 systolic over the weekend on the new regimen so we can uptitrate the lopressor and lisinopril -I will see her again in 2 wks to make more adjustments and f/u bmp  Labs/tests ordered:  No new labs/tests Next appt:  2 wks  08/05/2015 bp check  [name] L. Dishon Kehoe, D.O. [facility] [address], [place] [address] Cell Phone (Mon-Fri 8am-5pm):  [phone] On Call:  [phone] & follow prompts after 5pm & weekends Office Phone:  [phone] Office Fax:  [phone]

## 2015-07-22 NOTE — Patient Instructions (Signed)
Please call on Monday if blood pressure readings at home remain over 0000000 systolic.    I have put in a referral to pain mgt for your right lateral foot pain.

## 2015-07-26 ENCOUNTER — Telehealth: Payer: Self-pay | Admitting: *Deleted

## 2015-07-26 MED ORDER — METOPROLOL TARTRATE 50 MG PO TABS: ORAL_TABLET | ORAL | Status: DC

## 2015-07-26 NOTE — Telephone Encounter (Signed)
Increase metoprolol to 50mg  by mouth twice daily.  Cont checks and report to me if still remaining over 150/90 on Thursday

## 2015-07-26 NOTE — Telephone Encounter (Signed)
Patient called and stated that she took her BP this morning and it was 220/108 Left and 260/120 right. Patient started the BP medication change Saturday evening. Please Advise.

## 2015-07-26 NOTE — Telephone Encounter (Signed)
Patient notified and agreed.  

## 2015-07-27 ENCOUNTER — Encounter (HOSPITAL_COMMUNITY): Payer: Self-pay

## 2015-07-27 ENCOUNTER — Emergency Department (HOSPITAL_COMMUNITY): Payer: Medicare Other

## 2015-07-27 ENCOUNTER — Telehealth: Payer: Self-pay | Admitting: *Deleted

## 2015-07-27 ENCOUNTER — Emergency Department (HOSPITAL_COMMUNITY)
Admission: EM | Admit: 2015-07-27 | Discharge: 2015-07-28 | Disposition: A | Discharge status: Home / Self Care | Payer: Medicare Other | Attending: Emergency Medicine | Admitting: Emergency Medicine

## 2015-07-27 DIAGNOSIS — Z872 Personal history of diseases of the skin and subcutaneous tissue: Secondary | ICD-10-CM | POA: Diagnosis not present

## 2015-07-27 DIAGNOSIS — Z7982 Long term (current) use of aspirin: Secondary | ICD-10-CM | POA: Diagnosis not present

## 2015-07-27 DIAGNOSIS — G43909 Migraine, unspecified, not intractable, without status migrainosus: Secondary | ICD-10-CM | POA: Insufficient documentation

## 2015-07-27 DIAGNOSIS — G2581 Restless legs syndrome: Secondary | ICD-10-CM | POA: Insufficient documentation

## 2015-07-27 DIAGNOSIS — R110 Nausea: Secondary | ICD-10-CM | POA: Diagnosis not present

## 2015-07-27 DIAGNOSIS — Z79899 Other long term (current) drug therapy: Secondary | ICD-10-CM | POA: Diagnosis not present

## 2015-07-27 DIAGNOSIS — R197 Diarrhea, unspecified: Secondary | ICD-10-CM | POA: Insufficient documentation

## 2015-07-27 DIAGNOSIS — E785 Hyperlipidemia, unspecified: Secondary | ICD-10-CM | POA: Insufficient documentation

## 2015-07-27 DIAGNOSIS — G8929 Other chronic pain: Secondary | ICD-10-CM | POA: Diagnosis not present

## 2015-07-27 DIAGNOSIS — Z8673 Personal history of transient ischemic attack (TIA), and cerebral infarction without residual deficits: Secondary | ICD-10-CM | POA: Insufficient documentation

## 2015-07-27 DIAGNOSIS — Z8719 Personal history of other diseases of the digestive system: Secondary | ICD-10-CM | POA: Insufficient documentation

## 2015-07-27 DIAGNOSIS — Z9049 Acquired absence of other specified parts of digestive tract: Secondary | ICD-10-CM | POA: Insufficient documentation

## 2015-07-27 DIAGNOSIS — F329 Major depressive disorder, single episode, unspecified: Secondary | ICD-10-CM | POA: Diagnosis not present

## 2015-07-27 DIAGNOSIS — Z8781 Personal history of (healed) traumatic fracture: Secondary | ICD-10-CM | POA: Insufficient documentation

## 2015-07-27 DIAGNOSIS — I10 Essential (primary) hypertension: Secondary | ICD-10-CM | POA: Insufficient documentation

## 2015-07-27 DIAGNOSIS — G4700 Insomnia, unspecified: Secondary | ICD-10-CM | POA: Diagnosis not present

## 2015-07-27 DIAGNOSIS — F419 Anxiety disorder, unspecified: Secondary | ICD-10-CM | POA: Insufficient documentation

## 2015-07-27 DIAGNOSIS — R1013 Epigastric pain: Secondary | ICD-10-CM | POA: Insufficient documentation

## 2015-07-27 DIAGNOSIS — R079 Chest pain, unspecified: Secondary | ICD-10-CM | POA: Diagnosis not present

## 2015-07-27 DIAGNOSIS — G629 Polyneuropathy, unspecified: Secondary | ICD-10-CM | POA: Diagnosis not present

## 2015-07-27 DIAGNOSIS — M1990 Unspecified osteoarthritis, unspecified site: Secondary | ICD-10-CM | POA: Diagnosis not present

## 2015-07-27 LAB — COMPREHENSIVE METABOLIC PANEL
ALBUMIN: 4 g/dL (ref 3.5–5.0)
ALK PHOS: 74 U/L (ref 38–126)
ALT: 14 U/L (ref 14–54)
ANION GAP: 13 (ref 5–15)
AST: 31 U/L (ref 15–41)
BILIRUBIN TOTAL: 0.7 mg/dL (ref 0.3–1.2)
BUN: 15 mg/dL (ref 6–20)
CALCIUM: 9.7 mg/dL (ref 8.9–10.3)
CO2: 20 mmol/L — ABNORMAL LOW (ref 22–32)
Chloride: 106 mmol/L (ref 101–111)
Creatinine, Ser: 0.97 mg/dL (ref 0.44–1.00)
GFR, EST NON AFRICAN AMERICAN: 57 mL/min — AB (ref >60)
GLUCOSE: 129 mg/dL — AB (ref 65–99)
POTASSIUM: 3.4 mmol/L — AB (ref 3.5–5.1)
Sodium: 139 mmol/L (ref 135–145)
TOTAL PROTEIN: 7.1 g/dL (ref 6.5–8.1)

## 2015-07-27 LAB — CBC
HCT: 41.8 % (ref 36.0–46.0)
Hemoglobin: 13.6 g/dL (ref 12.0–15.0)
MCH: 31.9 pg (ref 26.0–34.0)
MCHC: 32.5 g/dL (ref 30.0–36.0)
MCV: 97.9 fL (ref 78.0–100.0)
PLATELETS: DECREASED 10*3/uL (ref 150–400)
RBC: 4.27 MIL/uL (ref 3.87–5.11)
RDW: 15.5 % (ref 11.5–15.5)
WBC: 4.4 10*3/uL (ref 4.0–10.5)

## 2015-07-27 LAB — I-STAT TROPONIN, ED: TROPONIN I, POC: 0 ng/mL (ref 0.00–0.08)

## 2015-07-27 LAB — LIPASE, BLOOD: Lipase: 22 U/L (ref 11–51)

## 2015-07-27 MED ORDER — MORPHINE SULFATE (PF) 4 MG/ML IV SOLN
4.0000 mg | Freq: Once | INTRAVENOUS | Status: AC
  Administered 2015-07-27: 4 mg via INTRAVENOUS
  Filled 2015-07-27: qty 1

## 2015-07-27 MED ORDER — ONDANSETRON HCL 4 MG/2ML IJ SOLN
4.0000 mg | Freq: Once | INTRAMUSCULAR | Status: AC
  Administered 2015-07-27: 4 mg via INTRAVENOUS
  Filled 2015-07-27: qty 2

## 2015-07-27 MED ORDER — SODIUM CHLORIDE 0.9 % IV BOLUS (SEPSIS)
1000.0000 mL | Freq: Once | INTRAVENOUS | Status: AC
  Administered 2015-07-27: 1000 mL via INTRAVENOUS

## 2015-07-27 NOTE — Telephone Encounter (Signed)
1.  As noted and discussed several times, she cannot safely take more gabapentin. 2.  She has vertigo.  I recommend Home Health PT for the Epley maneuver for this.  3.  She should still take the clonidine if her systolic blood pressure is over 160

## 2015-07-27 NOTE — ED Notes (Signed)
Attempted IV x 1. Lab attempted multiple times. Matt RN to try [name] IV.

## 2015-07-27 NOTE — ED Notes (Signed)
I ATTEMPTED TWICE TO COLLECT LABS AND WAS UNSUCCESSFUL

## 2015-07-27 NOTE — ED Notes (Signed)
Pt seen 2 weeks ago for similar epigastric abdominal pain.  Pain has continued since d/c.  Pt states abdominal pain, nausea with diarrhea.  Some pain in back.  Movement makes pain worse.

## 2015-07-27 NOTE — ED Provider Notes (Signed)
CSN: [id number]     Arrival date & time 07/27/15  1825 History   First MD Initiated Contact with Patient 07/27/15 2220     Chief Complaint  Patient presents with  . Abdominal Pain     (Consider location/radiation/quality/duration/timing/severity/associated sxs/prior Treatment) HPI Comments: Patient is a 75 year old female with history of prior cholecystectomy. She presents to the ED for evaluation of epigastric abdominal pain that started two days ago. She describes it as a constant pain, like a "red-hot poker" in her epigastrium. She reports nausea and diarrhea but no vomiting.  She denies any bloody stool.   Patient is a 75 y.o. female presenting with abdominal pain. The history is provided by the patient.  Abdominal Pain Pain location:  Epigastric Pain quality: stabbing   Pain radiates to:  Does not radiate Pain severity:  Moderate Onset quality:  Sudden Duration:  2 days Timing:  Constant Progression:  Worsening Chronicity:  New Relieved by:  Nothing Worsened by:  Nothing tried Ineffective treatments:  None tried   Past Medical History  Diagnosis Date  . GERD (gastroesophageal reflux disease)     hx pud '98  . Arthritis     djd  . Restless leg syndrome     takes Sinemet daily  . 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 Dysrhythmia     hx tachy palpitations  . Neuromuscular disorder ([place])     peripheral neuropathy, FEET AND LEGS  . Stroke [facility])     ? 6 months ago - tests okay - [facility]   . Cellulitis     10/11/11 hospitalized for cellulitis   . Hyperlipemia     takes Atorvastatin daily  . Peripheral neuropathy ([place])   . Elbow fracture, left April 2015  . Depression     takes Zoloft daily  . Vertigo   . Insomnia   . Hypertension     takes HCTZ daily  . Peripheral neuropathy ([facility])     takes Gabapentin daily  . History of bronchitis     many yrs ago  . Headache(784.0)     migraines yrs ago  . Joint pain   . Joint swelling   . Chronic back pain     scoliosis/stenosis  . Anxiety   . Cataracts, bilateral     immature   . Depression    Past Surgical History  Procedure Laterality Date  . Cervical disc surgery x2  2011  . Rt carotid enarterectomy  2011  . Rt knee arthroscopy  '99  . Left knee arthroscopy  2001  . Hematoma evacuation  2011    s/p rt cea  . Joint replacement  '01    total knee replacement, LEFT  . Abdominal hysterectomy  1975    bil oophorectomy  . Back surgery   MANY YRS AGO    laminectomy x3  . Cholecystectomy  1993  . Total knee arthroplasty  09/13/2011    Procedure: TOTAL KNEE ARTHROPLASTY;  Surgeon: Magnus Sinning, MD;  Location: [facility];  Service: Orthopedics;  Laterality: Right;  . Right knee replacement      09/2011   . Carpal tunnel release  07/09/2012    Procedure: CARPAL TUNNEL RELEASE;  Surgeon: Magnus Sinning, MD;  Location: [facility];  Service: Orthopedics;  Laterality: Left;  . Finger arthroplasty  07/09/2012    Procedure: FINGER ARTHROPLASTY;  Surgeon: Magnus Sinning, MD;  Location: [facility];  Service: Orthopedics;  Laterality:  Left;  Interposition Arthroplasty [facility] Joint Thumb Left   . Lipoma excision  07/09/2012    Procedure: EXCISION LIPOMA;  Surgeon: Magnus Sinning, MD;  Location: [facility];  Service: Orthopedics;  Laterality: Left;  Excision Lipoma Dorsum Left Wrist   . Orif ankle fracture Right 09/14/2014    FIBULA   . Orif ankle fracture Right 09/14/2014    Procedure: OPEN REDUCTION INTERNAL FIXATION (ORIF) RIGHT ANKLE FRACTURE/SYNDESMOSIS ;  Surgeon: Wylene Simmer, MD;  Location: [place];  Service: Orthopedics;  Laterality: Right;  . Colonosocpy    . Esophagogastroduodenoscopy    . Lumbar laminectomy with coflex 1 level Bilateral 04/19/2015    Procedure: LUMBAR TWO-THREE LUMBAR LAMINECTOMY WITH COFLEX ;  Surgeon: Kristeen Miss, MD;  Location: [place] [facility];  Service: Neurosurgery;  Laterality: Bilateral;  Bilateral L23 laminectomy and foraminotomy with coflex   Family History  Problem Relation Age  of Onset  . Congestive Heart Failure Father   . Congestive Heart Failure Sister   . Alzheimer's disease Mother   . Diabetes Brother   . Arthritis Brother    Social History  Substance Use Topics  . Smoking status: Never Smoker   . Smokeless tobacco: Never Used  . Alcohol Use: No   OB History    No data available     Review of Systems  Gastrointestinal: Positive for abdominal pain.  All other systems reviewed and are negative.     Allergies  Contrast media; Tramadol hcl; Acyclovir and related; and Sulfa drugs cross reactors  Home Medications   Prior to Admission medications   Medication Sig Start Date End Date Taking? Authorizing Provider  aspirin EC 81 MG tablet Take 1 tablet (81 mg total) by mouth daily. 07/14/15  Yes Florencia Reasons, MD  atorvastatin (LIPITOR) 40 MG tablet Take one tablet by mouth once daily for cholesterol 06/28/15  Yes Encarnacion Slates, NP  cholecalciferol (VITAMIN D) 1000 units tablet Take 1 tablet (1,000 Units total) by mouth daily. For bone health 06/28/15  Yes Encarnacion Slates, NP  docusate sodium (COLACE) 100 MG capsule Take 1 capsule (100 mg total) by mouth daily. For constipation 06/28/15  Yes Encarnacion Slates, NP  gabapentin (NEURONTIN) 400 MG capsule Take 1 capsule (400 mg total) by mouth 4 (four) times daily. For pain management/agitation 07/07/15  Yes [name] L [name], DO  lisinopril (PRINIVIL,ZESTRIL) 5 MG tablet Take 1 tablet (5 mg total) by mouth daily. For high blood pressure 07/22/15  Yes [name] L [name], DO  metoprolol (LOPRESSOR) 50 MG tablet Take one tablet by mouth twice daily to control blood pressure 07/26/15  Yes [name] L [name], DO  pyridOXINE (VITAMIN B-6) 50 MG tablet Take 1 tablet (50 mg total) by mouth daily. For Vitamin B-6 supplement 06/28/15  Yes Encarnacion Slates, NP  sertraline (ZOLOFT) 50 MG tablet Take 2 tablets (100 mg total) by mouth daily. 07/07/15  Yes [name] L [name], DO  sodium chloride (OCEAN) 0.65 % SOLN nasal spray Place 1 spray into both nostrils as  needed for congestion. 06/28/15  Yes Encarnacion Slates, NP  SUMAtriptan (IMITREX) 50 MG tablet Take 50 mg by mouth as needed for migraine or headache 07/07/15  Yes [name] L [name], DO   BP 172/87 mmHg  Pulse 78  Temp(Src) 98.2 F (36.8 C) (Oral)  Resp 16  SpO2 100% Physical Exam  Constitutional: [name] oriented to person, place, and time. She appears well-developed and well-nourished. No distress.  HENT:  Head: Normocephalic and  atraumatic.  Neck: Normal range of motion. Neck supple.  Cardiovascular: Normal rate and regular rhythm.  Exam reveals no gallop and no friction rub.   No murmur heard. Pulmonary/Chest: Effort normal and breath sounds normal. No respiratory distress. She has no wheezes.  Abdominal: Soft. Bowel sounds are normal. She exhibits no distension and no mass. There is tenderness. There is no rebound and no guarding.  Musculoskeletal: Normal range of motion. She exhibits no edema.  Neurological: [name] alert and oriented to person, place, and time.  Skin: Skin is warm and dry. [name] not diaphoretic.  Nursing note and vitals reviewed.   ED Course  Procedures (including critical care time) Labs Review Labs Reviewed  COMPREHENSIVE METABOLIC PANEL - Abnormal; Notable for the following:    Potassium 3.4 (*)    CO2 20 (*)    Glucose, Bld 129 (*)    GFR calc non Af Amer 57 (*)    All other components within normal limits  LIPASE, BLOOD  CBC  URINALYSIS, ROUTINE W REFLEX MICROSCOPIC (NOT AT [facility])  Randolm Idol, ED    Imaging Review Dg Chest 2 View  07/27/2015  CLINICAL DATA:  Left chest and abdominal pain, nausea and diarrhea for over 2 weeks. Initial encounter. EXAM: CHEST  2 VIEW COMPARISON:  PA and lateral chest 07/11/2015 and 10/11/2011. CT chest 09/17/2014. FINDINGS: The lungs are clear. Heart size is normal. No pneumothorax or pleural effusion. Eventration of the right hemidiaphragm is unchanged. Thoracic spondylosis is noted. IMPRESSION: No acute disease.  Electronically Signed   By: Inge Rise M.D.   On: 07/27/2015 19:19   I have personally reviewed and evaluated these images and lab results as part of my medical decision-making.   EKG Interpretation   Date/Time:  Wednesday July 27 2015 19:01:04 EST Ventricular Rate:  87 PR Interval:  167 QRS Duration: 80 QT Interval:  405 QTC Calculation: 487 R Axis:   37 Text Interpretation:  Sinus rhythm Low voltage, precordial leads Minimal  ST depression, inferior leads Borderline prolonged QT interval Baseline  wander in lead(s) II III aVR aVL aVF V1 V5 V6 Confirmed by Taria Castrillo  MD,  Winna Golla ([id number]) on 07/27/2015 7:53:35 PM      MDM   Final diagnoses:  None    Patient is a 75 year old female who presents with complaints of epigastric pain that she describes as sharp in nature. This is been present for the past 2 days. Her workup today reveals no elevation of white count, essentially unremarkable electrolytes, normal chest x-ray, and EKG and troponin which are not suggestive of a cardiac etiology. [name] not feeling much better despite medications given. [name] status post cholecystectomy and will undergo a CT scan of the abdomen and pelvis to rule out emergent pathology.  Care will be signed out to Dr. Wilson Singer at shift change awaiting the results of the CT scan.    [name], MD 07/30/15 [phone]

## 2015-07-27 NOTE — Telephone Encounter (Signed)
Noted  

## 2015-07-27 NOTE — Telephone Encounter (Signed)
Patient called and stated that she is in a mess this morning.  1.  Gabapentin cut from 600 to 400mg , patient has been taking mega doses of Tylenol to help with the pain and now her GI system is messed up. Has had diarrhea so bad that she has had to mop up the floor due to not getting to the bathroom. Patient is begging to go back on the Gabapentin 600mg  four times daily. Patient stated that she is not falling and doesn't even have to use a walker.  2.  Also when she was in her last Friday she had you to look in her left ear and now she cant move her head or eyes without the whole room spinning.   3.  Also confused about taking the Clonidine, do you still what her to take it if her BP is  160 or above. Please Advise.

## 2015-07-27 NOTE — Telephone Encounter (Signed)
Patient notified. Patient states that she has pain in her stomach with burning and having diarrhea, very loose stools (yellowish/watery). Gradually getting worse and worse. Food does not make it worse nor better. Patient stated that she is going to go to ER after her son gets off of work.

## 2015-07-27 NOTE — ED Notes (Signed)
Blood work for I-stat troponin delayed due to pt's is very dehydrated, two attempts for blood work.

## 2015-07-28 DIAGNOSIS — R1013 Epigastric pain: Secondary | ICD-10-CM | POA: Diagnosis not present

## 2015-07-28 MED ORDER — HYDROCODONE-ACETAMINOPHEN 5-325 MG PO TABS: 1.0000 | ORAL_TABLET | Freq: Four times a day (QID) | ORAL | Status: DC | PRN

## 2015-07-28 MED ORDER — ONDANSETRON HCL 4 MG/2ML IJ SOLN
4.0000 mg | Freq: Once | INTRAMUSCULAR | Status: AC
Start: 2015-07-28 — End: 2015-07-28
  Administered 2015-07-28: 4 mg via INTRAVENOUS
  Filled 2015-07-28: qty 2

## 2015-07-28 MED ORDER — PANTOPRAZOLE SODIUM 20 MG PO TBEC: 20.0000 mg | DELAYED_RELEASE_TABLET | Freq: Two times a day (BID) | ORAL | Status: DC

## 2015-07-28 MED ORDER — MORPHINE SULFATE (PF) 4 MG/ML IV SOLN
4.0000 mg | Freq: Once | INTRAVENOUS | Status: AC
  Administered 2015-07-28: 4 mg via INTRAVENOUS
  Filled 2015-07-28: qty 1

## 2015-07-28 MED ORDER — ONDANSETRON HCL 4 MG PO TABS: 4.0000 mg | ORAL_TABLET | Freq: Four times a day (QID) | ORAL | Status: DC

## 2015-07-28 NOTE — ED Notes (Signed)
Patient was alert, oriented and stable upon discharge. RN went over AVS and patient had no further questions.  

## 2015-07-28 NOTE — Discharge Instructions (Signed)
Hydrocodone as prescribed as needed for pain.  Return to the ER if you develop worsening pain, high fever, bloody stool, or other new and concerning symptoms.   Abdominal Pain, Adult Many things can cause abdominal pain. Usually, abdominal pain is not caused by a disease and will improve without treatment. It can often be observed and treated at home. Your health care provider will do a physical exam and possibly order blood tests and X-rays to help determine the seriousness of your pain. However, in many cases, more time must pass before a clear cause of the pain can be found. Before that point, your health care provider may not know if you need more testing or further treatment. HOME CARE INSTRUCTIONS Monitor your abdominal pain for any changes. The following actions may help to alleviate any discomfort you are experiencing:  Only take over-the-counter or prescription medicines as directed by your health care provider.  Do not take laxatives unless directed to do so by your health care provider.  Try a clear liquid diet (broth, tea, or water) as directed by your health care provider. Slowly move to a bland diet as tolerated. SEEK MEDICAL CARE IF:  You have unexplained abdominal pain.  You have abdominal pain associated with nausea or diarrhea.  You have pain when you urinate or have a bowel movement.  You experience abdominal pain that wakes you in the night.  You have abdominal pain that is worsened or improved by eating food.  You have abdominal pain that is worsened with eating fatty foods.  You have a fever. SEEK IMMEDIATE MEDICAL CARE IF:  Your pain does not go away within 2 hours.  You keep throwing up (vomiting).  Your pain is felt only in portions of the abdomen, such as the right side or the left lower portion of the abdomen.  You pass bloody or black tarry stools. MAKE SURE YOU:  Understand these instructions.  Will watch your condition.  Will get help right  away if you are not doing well or get worse.   This information is not intended to replace advice given to you by your health care provider. Make sure you discuss any questions you have with your health care provider.   Document Released: 02/28/2005 Document Revised: 02/09/2015 Document Reviewed: 01/28/2013 Elsevier Interactive Patient Education [employer].

## 2015-07-28 NOTE — ED Notes (Signed)
Patient transported to CT 

## 2015-07-28 NOTE — ED Provider Notes (Signed)
Assumed care at change of shift with CT pending. Unremarkable. On my exam she has some mild epigastric tenderness. No peritoneal signs. No distension. Says "it feels like there is something corroding in there" and points to epigastrium. Sometimes feels in back. Low suspicion for emergent process. Will give trial of PPI. PRN zofran. FU with PCP. May benefit from GI evaluation if persistent symptoms.   Virgel Manifold, MD 07/28/15 [phone]

## 2015-07-29 ENCOUNTER — Other Ambulatory Visit: Payer: Self-pay | Admitting: *Deleted

## 2015-07-29 MED ORDER — METOPROLOL TARTRATE 50 MG PO TABS: ORAL_TABLET | ORAL | Status: DC

## 2015-07-29 NOTE — Telephone Encounter (Signed)
Son requested to be faxed to [facility]

## 2015-07-29 NOTE — Telephone Encounter (Signed)
Patient son,Edward requested

## 2015-08-05 ENCOUNTER — Telehealth: Payer: Self-pay

## 2015-08-05 ENCOUNTER — Ambulatory Visit: Payer: Medicare Other | Admitting: Internal Medicine
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
## 2015-08-11 ENCOUNTER — Ambulatory Visit: Payer: Self-pay | Admitting: Internal Medicine

## 2015-08-12 ENCOUNTER — Telehealth: Payer: Self-pay

## 2015-08-12 NOTE — Telephone Encounter (Signed)
Received message on triage voicemail, went to ED was given Pantoprazole 20mg , if she is to continue she needs refill. Called back spoke with son Konrad Dolores, asked him if it was helping he didn't know. Told him she needs to come into the office before we could refill this Rx, he under stood this. Will have her to call, he knows she has cancelled the last two appts.

## 2015-08-22 ENCOUNTER — Other Ambulatory Visit: Payer: Self-pay | Admitting: *Deleted

## 2015-08-22 ENCOUNTER — Other Ambulatory Visit: Payer: Self-pay | Admitting: Internal Medicine

## 2015-08-22 MED ORDER — METOPROLOL TARTRATE 50 MG PO TABS: ORAL_TABLET | ORAL | Status: DC

## 2015-08-22 MED ORDER — VITAMIN B-6 50 MG PO TABS: 50.0000 mg | ORAL_TABLET | Freq: Every day | ORAL | Status: DC

## 2015-08-22 NOTE — Telephone Encounter (Signed)
[facility] 

## 2015-08-23 ENCOUNTER — Telehealth: Payer: Self-pay

## 2015-08-23 MED ORDER — GABAPENTIN 400 MG PO CAPS: 400.0000 mg | ORAL_CAPSULE | Freq: Four times a day (QID) | ORAL | Status: DC

## 2015-08-23 NOTE — Telephone Encounter (Signed)
Busy

## 2015-08-23 NOTE — Telephone Encounter (Signed)
Patient notified to STOP taking the Potassium and patient agreed. Gabapentin faxed to [facility]

## 2015-08-23 NOTE — Telephone Encounter (Signed)
A refill request for gabapentin and potassium came in from [facility]. After checking patient medication list, I realized that potassium is not listed as a current medication from the 07-22-15 OV. I called patient to confirm that she is actually taking potassium and the dosage. Patient stated that she takes 20 meq twice daily to total 40 meq daily.   Potassium 10 meq, once daily listed on medication list from [facility] 07-07-15.   Potassium 58meq, once daily listed on medication list from Hospital discharge summary on 07-11-15.   Please advise on correct dosage and instructions.

## 2015-08-23 NOTE — Telephone Encounter (Signed)
Pt is NOT on potassium and should NOT be taking potassium.   Her gabapentin may be refilled at the dose on her med list only and not at any higher doses or changes in frequency.

## 2015-08-25 ENCOUNTER — Other Ambulatory Visit: Payer: Self-pay
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
## 2015-08-25 NOTE — Telephone Encounter (Signed)
A prior authorization was received for sumatriptan succ 50 mg tablets. The PA was necessary due to the fact that patient's insurance will only pay for 9 pills per month, instead of the full 30 that was prescribed. The PA was not initiated because Dr. Mariea Clonts said that the patient should not need more than the allowed 9 pills per month.  Dr. Mariea Clonts also asked if the pharmacy could notify [facility] of any prescriptions that the patient attempts to fill from any outside provider.   I called the pharmacy to notify them that Dr. Mariea Clonts did not feel that the patient needed more than 9 pills per month. I also asked if they could notify this office of any outside prescriptions and they said that they would notify of any new prescriptions.   Pharmacy informed me that patient had recently (08/09/15) picked up a prescription for Hydrocodone/APAP 5-325 mg that was written by Earnie Larsson [phone]). Take 1 tablet by mouth every 6 hours as needed for pain. Quantity: 60  Refills:0.  I attempted to call the patient to inform her that the pharmacy could only dispense 9 sumatriptan pills but I only got a busy signal with each attempt. I will try her later this morning.

## 2015-08-30 ENCOUNTER — Encounter: Payer: Self-pay | Admitting: *Deleted

## 2015-09-02 ENCOUNTER — Other Ambulatory Visit: Payer: Self-pay | Admitting: *Deleted

## 2015-09-02 MED ORDER — LISINOPRIL 5 MG PO TABS: 5.0000 mg | ORAL_TABLET | Freq: Every day | ORAL | Status: DC

## 2015-09-02 MED ORDER — GABAPENTIN 400 MG PO CAPS: 400.0000 mg | ORAL_CAPSULE | Freq: Four times a day (QID) | ORAL | Status: DC

## 2015-09-02 NOTE — Telephone Encounter (Signed)
Patient son, [name] requested to be faxed to [facility]. faxed

## 2015-09-05 ENCOUNTER — Ambulatory Visit (INDEPENDENT_AMBULATORY_CARE_PROVIDER_SITE_OTHER): Payer: Medicare Other | Admitting: Internal Medicine

## 2015-09-05 ENCOUNTER — Encounter: Payer: Self-pay | Admitting: Internal Medicine

## 2015-09-05 VITALS — BP 138/100 | HR 71 | Temp 97.7°F | Resp 20 | Ht 65.0 in | Wt 190.8 lb

## 2015-09-05 DIAGNOSIS — Z1239 Encounter for other screening for malignant neoplasm of breast: Secondary | ICD-10-CM

## 2015-09-05 DIAGNOSIS — Z79899 Other long term (current) drug therapy: Secondary | ICD-10-CM

## 2015-09-05 DIAGNOSIS — I10 Essential (primary) hypertension: Secondary | ICD-10-CM | POA: Diagnosis not present

## 2015-09-05 DIAGNOSIS — M79671 Pain in right foot: Secondary | ICD-10-CM

## 2015-09-05 DIAGNOSIS — G63 Polyneuropathy in diseases classified elsewhere: Secondary | ICD-10-CM

## 2015-09-05 NOTE — Patient Instructions (Signed)
Please schedule your mammogram before I see you next.
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
## 2015-09-06 ENCOUNTER — Other Ambulatory Visit: Payer: Self-pay

## 2015-09-06 ENCOUNTER — Other Ambulatory Visit: Payer: Self-pay | Admitting: Internal Medicine

## 2015-09-06 NOTE — Telephone Encounter (Signed)
Rx printed and placed in Dr. Rolly Salter folder for signing.

## 2015-09-07 ENCOUNTER — Other Ambulatory Visit: Payer: Self-pay | Admitting: Nurse Practitioner

## 2015-09-07 ENCOUNTER — Telehealth: Payer: Self-pay | Admitting: Neurology

## 2015-09-07 ENCOUNTER — Telehealth: Payer: Self-pay | Admitting: *Deleted

## 2015-09-07 ENCOUNTER — Other Ambulatory Visit: Payer: Self-pay

## 2015-09-07 DIAGNOSIS — M79671 Pain in right foot: Secondary | ICD-10-CM

## 2015-09-07 MED ORDER — TRAMADOL HCL 50 MG PO TABS: 50.0000 mg | ORAL_TABLET | Freq: Four times a day (QID) | ORAL | Status: DC | PRN

## 2015-09-07 NOTE — Telephone Encounter (Signed)
Does not tolerate the tramadol.  It causes her to get confused and fall.

## 2015-09-07 NOTE — Telephone Encounter (Signed)
Patient called and stated that she saw you yesterday and wants to know if she can have Ultram due to the Pain in the Neck and back due to fall. Stated that she has taken this before and just wants some to get her to her appointment with Dr. Ellene Route on the 12th. Please Advise.   [place]

## 2015-09-07 NOTE — Telephone Encounter (Signed)
Pt called stating that she has right foot pain and she is taking tramadol which helped a lot. She just took the last one tablet about 76min ago and she needs refill to keep it going.   I checked her med list and tramadol was not there. She said she was prescribed tramadol after her back surgery by Dr. Ellene Route. Dr. Jannifer Franklin was given her hydrocodone for her peripheral neuropathy. She does not think she needs hydrocodone for her foot pain and tramadol is enough to help her. She takes 50mg  Q6h PRN.   I called in tramadol 50mg  60 tab with no refill for her. She understood if she needs more pills, she needs to call Dr. Ellene Route, Dr. Mariea Clonts or Dr. Jannifer Franklin. I told her do not take tramadol and hydrocodone together. She expressed understanding and appreciation. She has appointment with Dr. Jannifer Franklin in 11/2015.  Rosalin Hawking, MD PhD Stroke Neurology 09/07/2015 7:00 PM  Meds ordered this encounter  Medications  . traMADol (ULTRAM) 50 MG tablet    Sig: Take 1 tablet (50 mg total) by mouth every 6 (six) hours as needed.    Dispense:  60 tablet    Refill:  0

## 2015-09-08 NOTE — Telephone Encounter (Signed)
No Answer will try again later.

## 2015-09-09 NOTE — Telephone Encounter (Signed)
Patient notified and stated that she will follow up with Dr. Ellene Route.

## 2015-09-12 ENCOUNTER — Telehealth: Payer: Self-pay | Admitting: Neurology

## 2015-09-12 MED ORDER — HYDROCODONE-ACETAMINOPHEN 5-325 MG PO TABS: 1.0000 | ORAL_TABLET | Freq: Four times a day (QID) | ORAL | Status: DC | PRN

## 2015-09-12 NOTE — Telephone Encounter (Signed)
Patient is calling to get a written Rx for Hydrocodone.  She states she has not filled it in a while.  Thanks!

## 2015-09-12 NOTE — Telephone Encounter (Signed)
I will refill the hydrocodone prescription.

## 2015-09-12 NOTE — Telephone Encounter (Signed)
Patient called to check status of Rx request.

## 2015-09-12 NOTE — Telephone Encounter (Signed)
Last appt with Megan on 01/06/15 - canceled follow up w/ Willis on 07/04/15 - pending appt w/ Willis on 11/24/15.  Last rx for hydrocodone-apap 5-325mg , #60 provided on 05/16/15.

## 2015-09-13 NOTE — Telephone Encounter (Signed)
Patient called back and stated Terrence Dupont was going to ask Dr. Jannifer Franklin if he could fax Rx to her. I went over the message sent at 12:09 with her but she was insistent that she hear back from Dr. Jannifer Franklin. She also stated her neighbor has picked up her Rx for her several time.

## 2015-09-13 NOTE — Telephone Encounter (Signed)
I called the patient. The prescription for hydrocodone cannot be faxed into the pharmacy. The patient is unable to pick up the prescription herself, she has allowed her neighbor, [name] to come and pick up the prescription. As long as she has indicated that it is okay for this person to pick up her prescription that is fine. This note is to document that she has given verbal permission for this person to get her prescriptions.

## 2015-09-13 NOTE — Telephone Encounter (Signed)
Called pt back. Advised hydrocodone is a controlled substance and we cannot fax to pharmacy. 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 She wants her neighbor to pick up rx but they are not on HIPPA form. Advised they cannot pick this up for her since they are not on the HIPPA form. Advised that she can come with neighbor to pick up. She stated "I can't" I asked her why she can't. She stated "I just had back surgery" I asked her when and she stated "Does that really matter" She refused to tell me. She requested I ask Dr Jannifer Franklin. Advised I will send message and call her back to advise. She verbalized understanding.

## 2015-09-13 NOTE — Telephone Encounter (Signed)
Printed, signed and up front for pt pick-up.

## 2015-09-13 NOTE — Telephone Encounter (Signed)
Patient called to check status of hydrocodone prescription and asks that Rx be faxed to pharmacy. Advised this is a controlled substance and would need to be picked up. Patient states, last month Dr. Trenton Gammon faxed hydrocodone Rx to the pharmacy. States we could call [facility] Ph# [phone] to get their fax number or if any questions about this.

## 2015-09-14 NOTE — Telephone Encounter (Signed)
Patient called requesting form be mailed to her to fill out regarding neighbor being able to pick up controlled substance prescriptions in the future. Patient advised, form needs to be filled out in person. Patient states she has appointment in June and will fill out then, "you all have mailed this form to me before and I couldn't fill it out and lost it, I can't find it now", please let Dr. Jannifer Franklin know this. Patient advised I will let our Practice Administrator and Dr. Jannifer Franklin know so that they can discuss this. Patient appreciates this.

## 2015-09-21 ENCOUNTER — Telehealth: Payer: Self-pay | Admitting: *Deleted

## 2015-09-21 NOTE — Telephone Encounter (Signed)
Called patient to let her know that Dr.[name] wanted her to rest, ice and elevate, she stated that she understood.

## 2015-09-21 NOTE — Telephone Encounter (Signed)
Patient called regarding a possible twisted right ankle,she stated that she has appointment with Dr Doran Durand tomorrow ,he will not prescribe any medication until she is seen. She wants you to prescribe something for pain until she see him.Please Advise!
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
## 2015-09-22 ENCOUNTER — Telehealth: Payer: Self-pay

## 2015-09-22 ENCOUNTER — Telehealth: Payer: Self-pay | Admitting: Adult Health

## 2015-09-22 MED ORDER — ONDANSETRON HCL 4 MG PO TABS: 4.0000 mg | ORAL_TABLET | Freq: Three times a day (TID) | ORAL | Status: DC | PRN

## 2015-09-22 NOTE — Telephone Encounter (Signed)
Pt's son called back, he is not on DPR.

## 2015-09-22 NOTE — Telephone Encounter (Signed)
Left VM mssg @ home # to offer earlier appt. Also spoke to pt's son, Konrad Dolores, who said that pt would not be able to make appt today.

## 2015-09-22 NOTE — Telephone Encounter (Signed)
[name] called the oncall service to report nausea and vomiting for the past several hrs.  She has a temp of 99 but no abd pain, diarrhea, or rectal bleeding. She was encourage to drink fluids as able and call the office tomorrow if no improvement.  She asked for me to call in something for nausea. Zofran 4 mg q8 hrs prn nausea was called in to the [facility] on [address].

## 2015-09-23 ENCOUNTER — Telehealth: Payer: Self-pay | Admitting: Diagnostic Neuroimaging

## 2015-09-23 ENCOUNTER — Other Ambulatory Visit: Payer: Self-pay | Admitting: Neurology

## 2015-09-23 NOTE — Telephone Encounter (Signed)
Patient called multiple times to answering service requesting tramadol for chronic pain, after business hours.   I reviewed chart and called patient back. Last visit at [facility] was Aug 2016. Documentation from [facility] notes suggest possible pattern of pain medication overuse and pain medication seeking behavior. Patient was referred to pain clinic in Feb 2017, but she declined referral. Dr. Erlinda Hong filled 1 rx of tramadol in 09/07/15 with no refills.    PLAN: - advised patient to follow up in clinic with Dr. Jannifer Franklin and PCP to discuss long term pain medication mgmt - I did not prescribe tramadol medication - I will forward this note to PCP Dr. Mariea Clonts, Dr. Jannifer Franklin, Dr. Erlinda Hong and Ward Givens (NP) so they are aware of patient's clinical concerns/needs   Penni Bombard, MD [id number], [id number] PM Certified in Neurology, Neurophysiology and Neuroimaging  [facility] [address] [place], [place] [address] [phone]

## 2015-09-23 NOTE — Telephone Encounter (Signed)
Patient called multiple times to answering service requesting tramadol for chronic pain, after business hours.   I reviewed chart and called patient back. Last visit at [facility] was Aug 2016. Documentation from [facility] notes suggest possible pattern of pain medication overuse and pain medication seeking behavior. Patient was referred to pain clinic in Feb 2017, but she declined referral. Dr. Erlinda Hong filled 1 rx of tramadol in 09/07/15 with no refills.   PLAN:  - advised patient to follow up in clinic with Dr. Jannifer Franklin and PCP to discuss long term pain medication mgmt  - I did not prescribe tramadol medication  - I will forward this note to PCP Dr. Mariea Clonts, Dr. Jannifer Franklin, Dr. Erlinda Hong and Ward Givens (NP) so they are aware of patient's clinical concerns/needs    Penni Bombard, MD [id number], [id number] PM  Certified in Neurology, Neurophysiology and Neuroimaging  [facility]  [address] [place]  [place], [place] [address]  [phone]

## 2015-09-27 ENCOUNTER — Ambulatory Visit: Payer: Self-pay

## 2015-09-28 ENCOUNTER — Other Ambulatory Visit: Payer: Self-pay | Admitting: Internal Medicine

## 2015-09-30 ENCOUNTER — Other Ambulatory Visit: Payer: Self-pay | Admitting: Internal Medicine

## 2015-10-03 ENCOUNTER — Telehealth: Payer: Self-pay | Admitting: Neurology

## 2015-10-03 NOTE — Telephone Encounter (Signed)
Patient called to request refill of HYDROcodone-acetaminophen (NORCO/VICODIN) 5-325 MG tablet °

## 2015-10-03 NOTE — Telephone Encounter (Signed)
Last OV 01/06/15, cancelled 07/04/15 appt, pending appt 11/24/15. Last RF 09/12/15.

## 2015-10-04 NOTE — Telephone Encounter (Signed)
Pt called inquiring about refill. RN clarified that pt requested refill too soon, it is not due until 10/10/15 per Dr Jannifer Franklin. Pt understood. Pt also requested to let provider know appt 07/04/15, she was in the hospital.

## 2015-10-04 NOTE — Telephone Encounter (Signed)
The hydrocodone refill request was denied as it was too the refill too soon, is not due until 10/10/2015.

## 2015-10-10 ENCOUNTER — Telehealth: Payer: Self-pay | Admitting: Neurology

## 2015-10-10 MED ORDER — HYDROCODONE-ACETAMINOPHEN 5-325 MG PO TABS: 1.0000 | ORAL_TABLET | Freq: Four times a day (QID) | ORAL | Status: DC | PRN

## 2015-10-10 NOTE — Addendum Note (Signed)
Addended by: Monte Fantasia on: 10/10/2015 09:41 AM   Modules accepted: Orders

## 2015-10-10 NOTE — Telephone Encounter (Signed)
Patient called to check status of refill, call dropped or was disconnected before completion of call.

## 2015-10-10 NOTE — Telephone Encounter (Signed)
Pt request refill for hydrocodone.  °

## 2015-10-10 NOTE — Telephone Encounter (Signed)
Pt called in this morning requesting her refill be written as soon as possible. She said she is a lot of pain because she fell yesterday. I did not see a DPR on file with her neighbors name on it, Ken McMasters and [place]. I told the pt she would need a form signed with their names on it. Pt then stated she had one mailed to her and would send it with her neighbor. May call

## 2015-10-10 NOTE — Telephone Encounter (Addendum)
Patient is calling back about refill of hydrocodone. The Rx is not up front as yet. She states the DPR will be sent with her neighbors when they come to pick up her medication.  Per Cira Servant the patient needs to be informed she must sign the DPR in front of [name]. Please inform the patient.

## 2015-10-10 NOTE — Telephone Encounter (Signed)
Rx printed, signed, up front for pick-up. 

## 2015-10-13 ENCOUNTER — Ambulatory Visit: Payer: Self-pay

## 2015-10-19 ENCOUNTER — Telehealth: Payer: Self-pay

## 2015-10-19 NOTE — Telephone Encounter (Signed)
Patient called to see if I had heard from Dr.[name], patient informed that I am awaiting a response and that Dr.[name] is seeing patient's in clinic today and I will call when I have recommendations

## 2015-10-19 NOTE — Telephone Encounter (Signed)
Patient called c/o right leg pain. Patient tripped over a shoe string and is now with constant pain in right leg. Patient has taken ES Tylenol and Aleve with no relief. Patient will see Dr.Elsner tomorrow. Patient is requesting a rx for pain medication. Patient was offered appointment to see Dr.Green today and refused stating she does not have transportation and is unable to drive due to the pain.   Please advise

## 2015-10-19 NOTE — Telephone Encounter (Signed)
Patient aware of recommendations and stated she is unable to come in today. Patient will wait to see Dr.Elsner tomorrow

## 2015-10-19 NOTE — Telephone Encounter (Signed)
I recommend she apply ice to the affected area.  She needs to be seen to address her problem unfortunately.  She does not tolerate strong pain medications well and her kidneys do not permit me to order nsaids like aleve and mobic for her safely.

## 2015-10-20 DIAGNOSIS — M4806 Spinal stenosis, lumbar region: Secondary | ICD-10-CM | POA: Diagnosis not present

## 2015-10-20 DIAGNOSIS — M5416 Radiculopathy, lumbar region: Secondary | ICD-10-CM | POA: Diagnosis not present

## 2015-10-20 DIAGNOSIS — I10 Essential (primary) hypertension: Secondary | ICD-10-CM | POA: Diagnosis not present

## 2015-10-21 ENCOUNTER — Ambulatory Visit: Payer: Self-pay

## 2015-10-25 DIAGNOSIS — I10 Essential (primary) hypertension: Secondary | ICD-10-CM | POA: Diagnosis not present

## 2015-10-27 DIAGNOSIS — M5416 Radiculopathy, lumbar region: Secondary | ICD-10-CM | POA: Diagnosis not present

## 2015-10-27 DIAGNOSIS — M4806 Spinal stenosis, lumbar region: Secondary | ICD-10-CM | POA: Diagnosis not present

## 2015-11-01 ENCOUNTER — Telehealth: Payer: Self-pay | Admitting: Neurology

## 2015-11-01 ENCOUNTER — Encounter: Payer: Self-pay | Admitting: Internal Medicine

## 2015-11-01 NOTE — Telephone Encounter (Signed)
Patient is calling to order a written Rx hydrocodone-acetaminophen 5-325 mg tablets.  Thanks!

## 2015-11-01 NOTE — Telephone Encounter (Signed)
Returned pt call. Let her know that it's too early for refill (filled last on 10/10/15). Agreed to call back at the end of the week for rx pick-up next Monday.

## 2015-11-03 DIAGNOSIS — M5416 Radiculopathy, lumbar region: Secondary | ICD-10-CM | POA: Diagnosis not present

## 2015-11-04 ENCOUNTER — Other Ambulatory Visit: Payer: Self-pay | Admitting: Internal Medicine

## 2015-11-08 ENCOUNTER — Other Ambulatory Visit: Payer: Self-pay | Admitting: Neurology

## 2015-11-08 MED ORDER — HYDROCODONE-ACETAMINOPHEN 5-325 MG PO TABS: 1.0000 | ORAL_TABLET | Freq: Four times a day (QID) | ORAL | Status: DC | PRN

## 2015-11-08 NOTE — Telephone Encounter (Addendum)
Last OV 01/06/15 Last filled 10/10/15  04/11/15 appt cancelled < 24 hr notice 04/18/15 appt no-show 07/04/15 appt cancelled by pt  F/u scheduled 11/24/15

## 2015-11-08 NOTE — Telephone Encounter (Signed)
Rx printed, signed, up front for pick-up. 

## 2015-11-08 NOTE — Telephone Encounter (Signed)
Patient called to request refill of HYDROcodone-acetaminophen (NORCO/VICODIN) 5-325 MG tablet

## 2015-11-18 ENCOUNTER — Other Ambulatory Visit: Payer: Self-pay | Admitting: Neurological Surgery

## 2015-11-22 ENCOUNTER — Telehealth: Payer: Self-pay | Admitting: *Deleted

## 2015-11-22 ENCOUNTER — Telehealth: Payer: Self-pay | Admitting: Neurology

## 2015-11-22 NOTE — Telephone Encounter (Signed)
Pt has cancelled her appt on 11/24/15 as well as her last appt on 07/04/15. She no-showed her appt on 04/08/15. Hydrocodone was last filled on 11/08/15. Note from [facility] states that pt is also getting hydrocodone through them and tramadol from Dr. Ellene Route.

## 2015-11-22 NOTE — Addendum Note (Signed)
Addended by: Margette Fast on: 11/22/2015 08:18 PM   Modules accepted: Orders, Medications

## 2015-11-22 NOTE — Telephone Encounter (Signed)
[name], daughter in law called and stated that she is concerned that her mother in law is getting pain medication from multiple doctors. She stated that she will call a doctor office and make an appointment just to get her Rx and then cancel the appointment after getting the mediation. OD is a concern and stated that in Feb/March you and her husband worked on getting patient straightened out and she was doing good up till this point.  Dr. Ellene Route is supplying her with Tramadol and Dr. Jannifer Franklin is Supplying her with Hydrocodone and we are also giving her Hydrocodone. Daughter in Lanny Cramp is stating that the patient has had multiple falls and has to be helped up. Would like for you to contact the other providers and let them know the risk of giving her these Narcotics.  No information was discussed with the daughter in law due to not on the [facility].

## 2015-11-22 NOTE — Telephone Encounter (Signed)
Why are we giving her hydrocodone?  I have said she is not to get any of these medications.  It is very clear that she was not to get them.  I have also sent my notes to Dr. Jannifer Franklin about this problem.  Dr. Ellene Route is not in EPIC so I'm not clear if he receives the notes when I fax them through [facility].  I recommend pt comes back in with her son and daughter in law to review this again.

## 2015-11-22 NOTE — Telephone Encounter (Signed)
In light of this information, I will stop giving this patient hydrocodone.

## 2015-11-22 NOTE — Telephone Encounter (Signed)
Patient's daughter-in-law is calling and is concerned in that her mother-in-law is getting pain medication from several different doctors.   She states she will call a doctor and make an appointment and cancel after the doctor gives her medication. This person is not on DPR but I told her I would pass the information along.

## 2015-11-23 NOTE — Telephone Encounter (Signed)
I don't see where we have given patient any Hydrocodone anytime recently. Dr. Jannifer Franklin last refilled on 11/08/15. Spoke with daughter in law regarding making an appointment and she stated she will have to call back next week after checking her husband's schedule.

## 2015-11-24 ENCOUNTER — Ambulatory Visit: Payer: Medicare Other | Admitting: Neurology

## 2015-12-05 ENCOUNTER — Ambulatory Visit: Payer: Medicare Other | Admitting: Internal Medicine

## 2015-12-08 ENCOUNTER — Other Ambulatory Visit: Payer: Self-pay | Admitting: Internal Medicine

## 2015-12-09 ENCOUNTER — Telehealth: Payer: Self-pay | Admitting: *Deleted

## 2015-12-09 ENCOUNTER — Emergency Department (HOSPITAL_BASED_OUTPATIENT_CLINIC_OR_DEPARTMENT_OTHER)
Admission: EM | Admit: 2015-12-09 | Discharge: 2015-12-09 | Disposition: A | Discharge status: Home / Self Care | Payer: Medicare Other | Attending: Emergency Medicine | Admitting: Emergency Medicine

## 2015-12-09 ENCOUNTER — Encounter (HOSPITAL_BASED_OUTPATIENT_CLINIC_OR_DEPARTMENT_OTHER): Payer: Self-pay | Admitting: Emergency Medicine

## 2015-12-09 ENCOUNTER — Telehealth: Payer: Self-pay

## 2015-12-09 DIAGNOSIS — M545 Low back pain, unspecified: Secondary | ICD-10-CM

## 2015-12-09 DIAGNOSIS — I10 Essential (primary) hypertension: Secondary | ICD-10-CM | POA: Insufficient documentation

## 2015-12-09 DIAGNOSIS — E785 Hyperlipidemia, unspecified: Secondary | ICD-10-CM | POA: Diagnosis not present

## 2015-12-09 DIAGNOSIS — F329 Major depressive disorder, single episode, unspecified: Secondary | ICD-10-CM | POA: Diagnosis not present

## 2015-12-09 DIAGNOSIS — G8929 Other chronic pain: Secondary | ICD-10-CM | POA: Insufficient documentation

## 2015-12-09 DIAGNOSIS — M1990 Unspecified osteoarthritis, unspecified site: Secondary | ICD-10-CM | POA: Diagnosis not present

## 2015-12-09 DIAGNOSIS — Z765 Malingerer [conscious simulation]: Secondary | ICD-10-CM

## 2015-12-09 MED ORDER — HYDROCODONE-ACETAMINOPHEN 5-325 MG PO TABS
1.0000 | ORAL_TABLET | Freq: Once | ORAL | Status: AC
  Administered 2015-12-09: 1 via ORAL
  Filled 2015-12-09: qty 1

## 2015-12-09 NOTE — Telephone Encounter (Signed)
Spoke to [place] at Dr. Clarice Pole office. Wanted to let Dr. Jannifer Franklin know that pt continues to get narcotic prescriptions from multiple providers. Pt did receive Tramadol from their office last month on the same day that she got Norco from our office. Since then, Dr. Jannifer Franklin has stopped prescribing Norco. However, pt apparently received additional Norco at the ER. [name] also taking much more than prescribed per Javanna. Her pharmacy is aware and is no longer filling controlled substances for her.

## 2015-12-09 NOTE — Telephone Encounter (Signed)
Javanna/Dr Ellene Route [phone] called wanting to speak with RN reg narcotic medication. She said it is urgent. Please call

## 2015-12-09 NOTE — Telephone Encounter (Signed)
Yes we should put the do not prescribe narcotics alert in her chart.

## 2015-12-09 NOTE — ED Provider Notes (Signed)
CSN: [id number]     Arrival date & time 12/09/15  0548 History   First MD Initiated Contact with Patient 12/09/15 0600     Chief Complaint  Patient presents with  . Back Pain     (Consider location/radiation/quality/duration/timing/severity/associated sxs/prior Treatment) HPI  This is a 75 year old female with chronic back pain currently on tramadol. [name] scheduled for surgery on a ruptured lumbar disc on the 17th of this month by Dr. Ellene Route. Her pain had been well controlled with tramadol but since yesterday afternoon her pain is been severe. It is located in the right paralumbar region and is not worse with palpation or movement. 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   Review of patient's records indicate that her PCP, Dr. Mariea Clonts, is concerned that the patient has a narcotic problem and does not want her to have any hydrocodone prescriptions. It was noted that she has been getting narcotic prescriptions from multiple physicians.  Past Medical History  Diagnosis Date  . GERD (gastroesophageal reflux disease)     hx pud '98  . Arthritis     djd  . Restless leg syndrome     takes Sinemet daily  . 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 Dysrhythmia     hx tachy palpitations  . Neuromuscular disorder ([place])     peripheral neuropathy, FEET AND LEGS  . Stroke [facility])     ? 6 months ago - tests okay - [facility]   . Cellulitis     10/11/11 hospitalized for cellulitis   . Hyperlipemia     takes Atorvastatin daily  . Peripheral neuropathy ([place])   . Elbow fracture, left April 2015  . Depression     takes Zoloft daily  . Vertigo   . Insomnia   . Hypertension     takes HCTZ daily  . Peripheral neuropathy (HCC)     takes Gabapentin daily  . History of bronchitis     many yrs ago  . Headache(784.0)     migraines yrs ago  . Joint pain   . Joint swelling   . Chronic back pain     scoliosis/stenosis  . Anxiety   .  Cataracts, bilateral     immature   . Depression    Past Surgical History  Procedure Laterality Date  . Cervical disc surgery x2  2011  . Rt carotid enarterectomy  2011  . Rt knee arthroscopy  '99  . Left knee arthroscopy  2001  . Hematoma evacuation  2011    s/p rt cea  . Joint replacement  '01    total knee replacement, LEFT  . Abdominal hysterectomy  1975    bil oophorectomy  . Back surgery   MANY YRS AGO    laminectomy x3  . Cholecystectomy  1993  . Total knee arthroplasty  09/13/2011    Procedure: TOTAL KNEE ARTHROPLASTY;  Surgeon: Magnus Sinning, MD;  Location: [facility];  Service: Orthopedics;  Laterality: Right;  . Right knee replacement      09/2011   . Carpal tunnel release  07/09/2012    Procedure: CARPAL TUNNEL RELEASE;  Surgeon: Magnus Sinning, MD;  [facility]: [facility];  Service: Orthopedics;  Laterality: Left;  . Finger arthroplasty  07/09/2012    Procedure: FINGER ARTHROPLASTY;  Surgeon: Magnus Sinning, MD;  [facility]: [facility];  Service: Orthopedics;  Laterality: Left;  Interposition Arthroplasty CMC Joint Thumb Left   . Lipoma excision  07/09/2012    Procedure: EXCISION LIPOMA;  Surgeon: Magnus Sinning, MD;  [facility];  Service: Orthopedics;  Laterality: Left;  Excision Lipoma Dorsum Left Wrist   . Orif ankle fracture Right 09/14/2014    FIBULA   . Orif ankle fracture Right 09/14/2014    Procedure: OPEN REDUCTION INTERNAL FIXATION (ORIF) RIGHT ANKLE FRACTURE/SYNDESMOSIS ;  Surgeon: Wylene Simmer, MD;  Location: [facility];  Service: Orthopedics;  Laterality: Right;  . Colonosocpy    . Esophagogastroduodenoscopy    . Lumbar laminectomy with coflex 1 level Bilateral 04/19/2015    Procedure: LUMBAR TWO-THREE LUMBAR LAMINECTOMY WITH COFLEX ;  Surgeon: Kristeen Miss, MD;  Location: [facility];  Service: Neurosurgery;  Laterality: Bilateral;  Bilateral L23 laminectomy and foraminotomy with coflex   Family History  Problem Relation Age of Onset  . Congestive Heart Failure  Father   . Congestive Heart Failure Sister   . Alzheimer's disease Mother   . Diabetes Brother   . Arthritis Brother    Social History  Substance Use Topics  . Smoking status: Never Smoker   . Smokeless tobacco: Never Used  . Alcohol Use: No   OB History    No data available     Review of Systems  All other systems reviewed and are negative.   Allergies  Contrast media; Acyclovir and related; and Sulfa drugs cross reactors  Home Medications   Prior to Admission medications   Medication Sig Start Date End Date Taking? Authorizing Provider  aspirin EC 81 MG tablet Take 1 tablet (81 mg total) by mouth daily. 07/14/15   Florencia Reasons, MD  atorvastatin (LIPITOR) 40 MG tablet Take one tablet by mouth once daily for cholesterol 06/28/15   Encarnacion Slates, NP  atorvastatin (LIPITOR) 40 MG tablet Take 1 tablet by mouth once daily for cholesterol 11/07/15   [name] L [name], DO  cholecalciferol (VITAMIN D) 1000 units tablet Take 1 tablet (1,000 Units total) by mouth daily. For bone health 06/28/15   Encarnacion Slates, NP  docusate sodium (COLACE) 100 MG capsule Take 1 capsule (100 mg total) by mouth daily. For constipation 06/28/15   Encarnacion Slates, NP  gabapentin (NEURONTIN) 400 MG capsule Take 1 capsule (400 mg total) by mouth 4 (four) times daily. For pain management/agitation 09/02/15   [name] L [name], DO  lisinopril (PRINIVIL,ZESTRIL) 5 MG tablet Take 1 tablet (5 mg total) by mouth daily. For high blood pressure 09/02/15   [name] L [name], DO  metoprolol (LOPRESSOR) 50 MG tablet Take one tablet by mouth twice daily to control blood pressure 08/22/15   [name] L [name], DO  ondansetron (ZOFRAN) 4 MG tablet Take 1 tablet (4 mg total) by mouth every 8 (eight) hours as needed for nausea or vomiting. 09/22/15   [place] Hawthorn, NP  pantoprazole (PROTONIX) 20 MG tablet Take 1 tablet (20 mg total) by mouth 2 (two) times daily. 07/28/15   Virgel Manifold, MD  pyridOXINE (VITAMIN B-6) 50 MG tablet Take 1 tablet (50 mg total)  by mouth daily. For Vitamin B-6 supplement 08/22/15   [name] L [name], DO  sertraline (ZOLOFT) 50 MG tablet Take 2 tablets (100 mg total) by mouth daily. 07/07/15   [name] L [name], DO  sodium chloride (OCEAN) 0.65 % SOLN nasal spray Place 1 spray into both nostrils as needed for congestion. 06/28/15   Encarnacion Slates, NP  SUMAtriptan (IMITREX) 50 MG tablet  Take 1 tablet by mouth  every 2 hours as needed for migraine/headache. May  repeat in 2 hours if  headache persists or recurs 10/03/15   [name] L [name], DO   BP 175/105 mmHg  Pulse 95  Temp(Src) 97.8 F (36.6 C) (Oral)  Resp 18  Ht 5\' 5"  (1.651 m)  Wt 187 lb (84.823 kg)  BMI 31.12 kg/m2  SpO2 100%   Physical Exam  General: Well-developed, well-nourished female in no acute distress; appearance consistent with age of record HENT: normocephalic; atraumatic Eyes: pupils equal, round and reactive to light; extraocular muscles intact Neck: supple Heart: regular rate and rhythm Lungs: clear to auscultation bilaterally Abdomen: soft; nondistended Back: No lumbar or paralumbar tenderness Extremities: No deformity; full range of motion; pulses normal Neurologic: Awake, alert and oriented; motor function intact in all extremities and symmetric; no facial droop; subjectively altered sensation in lower extremities consistent with the patient's chronic neuropathy Skin: Warm and dry Psychiatric: Normal mood and affect    ED Course  Procedures (including critical care time)   MDM  We will give the patient one dose of hydrocodone to treat her acute exacerbation of pain and she was advised to contact her physician later this morning. [name] amenable to this.     Shanon Rosser, MD 12/09/15 [phone]

## 2015-12-09 NOTE — Telephone Encounter (Signed)
Added to patient's chart.

## 2015-12-09 NOTE — Telephone Encounter (Signed)
Events noted, we are not to give this patient any further opiate medications.

## 2015-12-09 NOTE — Telephone Encounter (Signed)
Patient states that she never had 3 providers giving medication at the same time. Patient states that she would never take medications from one provider while under the care of another provider.  Patient was in [facility] when she received rx that had Dr.[name]'s name on it back in November 2016. Patient would like to assure Dr.[name] that she is not misusing medication or pharmacy hopping .   Patient is using a Tens Unit and it helps with pain  Patient is no longer seeing Dr.Willis

## 2015-12-09 NOTE — Telephone Encounter (Signed)
Noted.  We have evidence to suggest otherwise.  Copied [place] and [place].

## 2015-12-09 NOTE — Telephone Encounter (Signed)
Received call from [facility] @ Dr. Wallene Huh office concerning patient's usage of hydrocodone, they will not be prescribing patient anymore pain medication. Pt is using multiple pharmacies and getting prescriptions from Dr. Wallene Huh and Dr. Jannifer Franklin and also Dr. Joneen Caraway. Also it looks like pt went to the ED this morning trying to get more pain medication.  Should I put a note in patient's chart (DO NOT PRESCRIBE NARCOTICS)?

## 2015-12-09 NOTE — ED Notes (Signed)
Pt c/o back pain. Pt is scheduled for back surgery on 7/17. Pt states tramadol isn't helping.

## 2015-12-10 ENCOUNTER — Emergency Department (HOSPITAL_BASED_OUTPATIENT_CLINIC_OR_DEPARTMENT_OTHER)
Admission: EM | Admit: 2015-12-10 | Discharge: 2015-12-10 | Disposition: A | Discharge status: Home / Self Care | Payer: Medicare Other | Attending: Emergency Medicine | Admitting: Emergency Medicine

## 2015-12-10 ENCOUNTER — Encounter (HOSPITAL_BASED_OUTPATIENT_CLINIC_OR_DEPARTMENT_OTHER): Payer: Self-pay | Admitting: Emergency Medicine

## 2015-12-10 DIAGNOSIS — E785 Hyperlipidemia, unspecified: Secondary | ICD-10-CM | POA: Insufficient documentation

## 2015-12-10 DIAGNOSIS — M549 Dorsalgia, unspecified: Secondary | ICD-10-CM | POA: Diagnosis not present

## 2015-12-10 DIAGNOSIS — Z765 Malingerer [conscious simulation]: Secondary | ICD-10-CM

## 2015-12-10 DIAGNOSIS — F329 Major depressive disorder, single episode, unspecified: Secondary | ICD-10-CM | POA: Insufficient documentation

## 2015-12-10 DIAGNOSIS — Z8673 Personal history of transient ischemic attack (TIA), and cerebral infarction without residual deficits: Secondary | ICD-10-CM | POA: Insufficient documentation

## 2015-12-10 DIAGNOSIS — F1110 Opioid abuse, uncomplicated: Secondary | ICD-10-CM | POA: Diagnosis not present

## 2015-12-10 DIAGNOSIS — Z79899 Other long term (current) drug therapy: Secondary | ICD-10-CM | POA: Diagnosis not present

## 2015-12-10 DIAGNOSIS — Z7289 Other problems related to lifestyle: Secondary | ICD-10-CM | POA: Diagnosis not present

## 2015-12-10 DIAGNOSIS — I10 Essential (primary) hypertension: Secondary | ICD-10-CM | POA: Diagnosis not present

## 2015-12-10 DIAGNOSIS — Z7982 Long term (current) use of aspirin: Secondary | ICD-10-CM | POA: Insufficient documentation

## 2015-12-10 DIAGNOSIS — M545 Low back pain: Secondary | ICD-10-CM | POA: Diagnosis present

## 2015-12-10 MED ORDER — NAPROXEN 250 MG PO TABS
500.0000 mg | ORAL_TABLET | Freq: Once | ORAL | Status: AC
  Administered 2015-12-10: 500 mg via ORAL
  Filled 2015-12-10: qty 2

## 2015-12-10 NOTE — ED Provider Notes (Addendum)
CSN: [id number]     Arrival date & time 12/10/15  0549 History   First MD Initiated Contact with Patient 12/10/15 [phone]     Chief Complaint  Patient presents with  . Back Pain     (Consider location/radiation/quality/duration/timing/severity/associated sxs/prior Treatment) HPI This is a 75 year old female with history of chronic back pain. She was seen here yesterday morning complaining of exacerbation of her back pain that was not relieved with tramadol. A review of her chart showed evidence of narcotic seeking behavior, getting prescriptions from multiple doctors. Notes in the chart by her primary care physician, Dr. Mariea Clonts, indicates that she is not to be given any narcotic pain medicines, particularly hydrocodone as it was causing her to fall. She was given a single hydrocodone tablet yesterday morning to ease her pain until she could contact her PCP and her neurosurgeon.  Also, yesterday the patient stated that she had been taking tramadol but it was no longer strong enough to control her pain. She also stated that she had been prescribed hydrocodone in the past by Dr. Jannifer Franklin but had run out. This morning she states that she is out of tramadol and that she had thrown away her hydrocodone.  She contacted Dr. Clarice Pole office yesterday requesting a prescription refill. Dr. Clarice Pole office contacted Dr. Cyndi Lennert office concerned over the patient's multiple prescriptions. Dr. Mariea Clonts authorized that an [place] be placed into Epic stating that the patient is not to receive narcotics.  A review of her records shows that there has been concern for narcotic abuse by not only Dr. Mariea Clonts but also by her neurologist's and neurosurgeon's offices.  According to the [facility] state controlled substances database the patient has received 40 prescriptions for narcotics in the last year, primarily tramadol and hydrocodone but also oxycodone. These have been from multiple prescribers. They total 2142 tablets.     Regarding the patient's pain it is located in the right paralumbar region and is described as severe and deep. It is not worse with palpation or movement. She has no new neurological, bladder or bowel changes. [name] complaining of a dry mouth.  Past Medical History  Diagnosis Date  . GERD (gastroesophageal reflux disease)     hx pud '98  . Arthritis     djd  . Restless leg syndrome     takes Sinemet daily  . 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 Dysrhythmia     hx tachy palpitations  . Neuromuscular disorder ([place])     peripheral neuropathy, FEET AND LEGS  . Stroke [facility])     ? 6 months ago - tests okay - [facility]   . Cellulitis     10/11/11 hospitalized for cellulitis   . Hyperlipemia     takes Atorvastatin daily  . Peripheral neuropathy ([place])   . Elbow fracture, left April 2015  . Depression     takes Zoloft daily  . Vertigo   . Insomnia   . Hypertension     takes HCTZ daily  . Peripheral neuropathy (HCC)     takes Gabapentin daily  . History of bronchitis     many yrs ago  . Headache(784.0)     migraines yrs ago  . Joint pain   . Joint swelling   . Chronic back pain     scoliosis/stenosis  . Anxiety   . Cataracts, bilateral     immature   . Depression    Past Surgical  History  Procedure Laterality Date  . Cervical disc surgery x2  2011  . Rt carotid enarterectomy  2011  . Rt knee arthroscopy  '99  . Left knee arthroscopy  2001  . Hematoma evacuation  2011    s/p rt cea  . Joint replacement  '01    total knee replacement, LEFT  . Abdominal hysterectomy  1975    bil oophorectomy  . Back surgery   MANY YRS AGO    laminectomy x3  . Cholecystectomy  1993  . Total knee arthroplasty  09/13/2011    Procedure: TOTAL KNEE ARTHROPLASTY;  Surgeon: Magnus Sinning, MD;  [facility];  Service: Orthopedics;  Laterality: Right;  . Right knee replacement      09/2011   . Carpal tunnel release  07/09/2012    Procedure: CARPAL TUNNEL RELEASE;  Surgeon:  Magnus Sinning, MD;  [facility];  Service: Orthopedics;  Laterality: Left;  . Finger arthroplasty  07/09/2012    Procedure: FINGER ARTHROPLASTY;  Surgeon: Magnus Sinning, MD;  [facility];  Service: Orthopedics;  Laterality: Left;  Interposition Arthroplasty CMC Joint Thumb Left   . Lipoma excision  07/09/2012    Procedure: EXCISION LIPOMA;  Surgeon: Magnus Sinning, MD;  [facility];  Service: Orthopedics;  Laterality: Left;  Excision Lipoma Dorsum Left Wrist   . Orif ankle fracture Right 09/14/2014    FIBULA   . Orif ankle fracture Right 09/14/2014    Procedure: OPEN REDUCTION INTERNAL FIXATION (ORIF) RIGHT ANKLE FRACTURE/SYNDESMOSIS ;  Surgeon: Wylene Simmer, MD;  Location: [facility];  Service: Orthopedics;  Laterality: Right;  . Colonosocpy    . Esophagogastroduodenoscopy    . Lumbar laminectomy with coflex 1 level Bilateral 04/19/2015    Procedure: LUMBAR TWO-THREE LUMBAR LAMINECTOMY WITH COFLEX ;  Surgeon: Kristeen Miss, MD;  Location: [facility];  Service: Neurosurgery;  Laterality: Bilateral;  Bilateral L23 laminectomy and foraminotomy with coflex   Family History  Problem Relation Age of Onset  . Congestive Heart Failure Father   . Congestive Heart Failure Sister   . Alzheimer's disease Mother   . Diabetes Brother   . Arthritis Brother    Social History  Substance Use Topics  . Smoking status: Never Smoker   . Smokeless tobacco: Never Used  . Alcohol Use: No   OB History    No data available     Review of Systems  All other systems reviewed and are negative.   Allergies  Contrast media; Acyclovir and related; and Sulfa drugs cross reactors  Home Medications   Prior to Admission medications   Medication Sig Start Date End Date Taking? Authorizing Provider  aspirin EC 81 MG tablet Take 1 tablet (81 mg total) by mouth daily. 07/14/15   Florencia Reasons, MD  atorvastatin (LIPITOR) 40 MG tablet Take one tablet by mouth once daily for cholesterol 06/28/15   Encarnacion Slates, NP  atorvastatin (LIPITOR) 40 MG tablet Take 1 tablet by mouth once daily for cholesterol 11/07/15   [name] L [name], DO  cholecalciferol (VITAMIN D) 1000 units tablet Take 1 tablet (1,000 Units total) by mouth daily. For bone health 06/28/15   Encarnacion Slates, NP  docusate sodium (COLACE) 100 MG capsule Take 1 capsule (100 mg total) by mouth daily. For constipation 06/28/15   Encarnacion Slates, NP  gabapentin (NEURONTIN) 400 MG capsule Take 1 capsule (400 mg total) by mouth 4 (four) times daily. For pain management/agitation 09/02/15  [name] L [name], DO  lisinopril (PRINIVIL,ZESTRIL) 5 MG tablet Take 1 tablet (5 mg total) by mouth daily. For high blood pressure 09/02/15   [name] L [name], DO  metoprolol (LOPRESSOR) 50 MG tablet Take one tablet by mouth twice daily to control blood pressure 08/22/15   [name] L [name], DO  ondansetron (ZOFRAN) 4 MG tablet Take 1 tablet (4 mg total) by mouth every 8 (eight) hours as needed for nausea or vomiting. 09/22/15   [place] Hawthorn, NP  pantoprazole (PROTONIX) 20 MG tablet Take 1 tablet (20 mg total) by mouth 2 (two) times daily. 07/28/15   Virgel Manifold, MD  pyridOXINE (VITAMIN B-6) 50 MG tablet Take 1 tablet (50 mg total) by mouth daily. For Vitamin B-6 supplement 08/22/15   [name] L [name], DO  sertraline (ZOLOFT) 50 MG tablet Take 2 tablets (100 mg total) by mouth daily. 07/07/15   [name] L [name], DO  sodium chloride (OCEAN) 0.65 % SOLN nasal spray Place 1 spray into both nostrils as needed for congestion. 06/28/15   Encarnacion Slates, NP  SUMAtriptan (IMITREX) 50 MG tablet Take 1 tablet by mouth  every 2 hours as needed for migraine/headache. May  repeat in 2 hours if  headache persists or recurs 12/09/15   [name] L [name], DO   BP 123/64 mmHg  Pulse 99  Temp(Src) 97.8 F (36.6 C) (Oral)  Resp 18  SpO2 100%   Physical Exam  General: Well-developed, well-nourished female in no acute distress; appearance consistent with age of record HENT: normocephalic; atraumatic Eyes:  pupils equal, round and reactive to light; extraocular muscles intact Neck: supple Heart: regular rate and rhythm Lungs: clear to auscultation bilaterally Abdomen: soft; nondistended Back: No lumbar or paralumbar tenderness Extremities: No deformity; full range of motion; pulses normal Neurologic: Awake, alert and oriented; motor function intact in all extremities and symmetric; no facial droop; subjectively altered sensation in lower extremities consistent with the patient's chronic neuropathy Skin: Warm and dry Psychiatric: Tearful; argumentative   ED Course  Procedures (including critical care time)   MDM  The patient was advised of the concerns raised by her physicians in the medical record regarding overuse of narcotics. She was argumentative when presented with this information, often contradicting herself. As noted above yesterday she stated that she had tramadol and it wasn't working. Today she states she is out of tramadol. Yesterday she stated she had run out of hydrocodone, today she states she threw it out.  The patient was advised that the ED is not the appropriate venue for treatment of chronic pain. She was advised that concern has been raised for narcotic seeking behavior by multiple physicians including myself and that she would not be receiving any narcotic pain medications.  This note will be forwarded to Drs. [name], Designer, television/film set and [place].    Shanon Rosser, MD 12/10/15 [id number]  Shanon Rosser, MD 12/10/15 [phone]

## 2015-12-10 NOTE — ED Notes (Signed)
Pt denies any needs at this time, she is not receptive to discharge instructions.

## 2015-12-10 NOTE — ED Notes (Signed)
Pt c/o low back pain

## 2015-12-10 NOTE — ED Notes (Addendum)
Dr Molpus and this writer met with Mrs Atwood and discussed her recent doctor visit and the Rx she has received and filled from several different physician at several different pharmacies for opioid medications. She was advised that no further Rx for opioid medications will be given or opioid medication dispensed after a physical examination that was negative for low back pain or disability was performed by doctor. Mrs Myre verbalized understanding after list of phyisicans Rx's and pharmacies were shown. 

## 2015-12-12 NOTE — Telephone Encounter (Signed)
This message is a FYI from the patient

## 2015-12-13 ENCOUNTER — Telehealth: Payer: Self-pay | Admitting: *Deleted

## 2015-12-13 NOTE — Telephone Encounter (Signed)
Patient notified

## 2015-12-13 NOTE — Telephone Encounter (Signed)
Patient called and left message on voicemail crying and stating that she needs something for pain. Stated that she is having back surgery on Monday with Dr. Ellene Route due to rupture disc at 2-3 which is pressing against her sciatic nerve. Patient states she needs something for pain and his office will not give it to her. (Patient is flagged NO NARCOTICS). Patient wants something to help with pain. Patient has also gone to the ER to obtain medication on 12/10/2015.  Please Advise.

## 2015-12-13 NOTE — Telephone Encounter (Signed)
Per Dr Mariea Clonts we are not to prescribe pt any narcotics, she can try OTC for pain such as tylenol 500 mg 1-2 tablets every 8 hours as needed for pain, not to exceed 6 tablets daily

## 2015-12-16 ENCOUNTER — Encounter (HOSPITAL_COMMUNITY): Payer: Self-pay | Admitting: *Deleted

## 2015-12-16 DIAGNOSIS — K219 Gastro-esophageal reflux disease without esophagitis: Secondary | ICD-10-CM | POA: Diagnosis present

## 2015-12-16 DIAGNOSIS — M419 Scoliosis, unspecified: Secondary | ICD-10-CM | POA: Diagnosis present

## 2015-12-16 DIAGNOSIS — G629 Polyneuropathy, unspecified: Secondary | ICD-10-CM | POA: Diagnosis present

## 2015-12-16 DIAGNOSIS — M1990 Unspecified osteoarthritis, unspecified site: Secondary | ICD-10-CM | POA: Diagnosis present

## 2015-12-16 DIAGNOSIS — Z96652 Presence of left artificial knee joint: Secondary | ICD-10-CM | POA: Diagnosis present

## 2015-12-16 DIAGNOSIS — Z888 Allergy status to other drugs, medicaments and biological substances status: Secondary | ICD-10-CM

## 2015-12-16 DIAGNOSIS — Z91041 Radiographic dye allergy status: Secondary | ICD-10-CM

## 2015-12-16 DIAGNOSIS — E785 Hyperlipidemia, unspecified: Secondary | ICD-10-CM | POA: Diagnosis present

## 2015-12-16 DIAGNOSIS — R110 Nausea: Secondary | ICD-10-CM | POA: Diagnosis present

## 2015-12-16 DIAGNOSIS — Z90710 Acquired absence of both cervix and uterus: Secondary | ICD-10-CM

## 2015-12-16 DIAGNOSIS — R42 Dizziness and giddiness: Secondary | ICD-10-CM | POA: Diagnosis present

## 2015-12-16 DIAGNOSIS — I10 Essential (primary) hypertension: Secondary | ICD-10-CM | POA: Diagnosis present

## 2015-12-16 DIAGNOSIS — F329 Major depressive disorder, single episode, unspecified: Secondary | ICD-10-CM | POA: Diagnosis present

## 2015-12-16 DIAGNOSIS — M5116 Intervertebral disc disorders with radiculopathy, lumbar region: Principal | ICD-10-CM | POA: Diagnosis present

## 2015-12-16 DIAGNOSIS — Z96653 Presence of artificial knee joint, bilateral: Secondary | ICD-10-CM | POA: Diagnosis present

## 2015-12-16 DIAGNOSIS — G4730 Sleep apnea, unspecified: Secondary | ICD-10-CM | POA: Diagnosis present

## 2015-12-16 DIAGNOSIS — F419 Anxiety disorder, unspecified: Secondary | ICD-10-CM | POA: Diagnosis present

## 2015-12-16 DIAGNOSIS — G2581 Restless legs syndrome: Secondary | ICD-10-CM | POA: Diagnosis present

## 2015-12-16 DIAGNOSIS — Z882 Allergy status to sulfonamides status: Secondary | ICD-10-CM

## 2015-12-16 MED ORDER — CEFAZOLIN SODIUM-DEXTROSE 2-4 GM/100ML-% IV SOLN
2.0000 g | INTRAVENOUS | Status: AC
  Administered 2015-12-19: 2 g via INTRAVENOUS
  Filled 2015-12-16: qty 100

## 2015-12-16 NOTE — Progress Notes (Signed)
Pt denies SOB, chest pain, and being under the care of a cardiologist. Pt made aware to stop taking Aspirin, vitamins, fish oil and herbal medications. Do not take any NSAIDs ie: Ibuprofen, Advil, Naproxen, BC and Goody Powder or any medication containing Aspirin. Pt verbalized understanding of all pre-op instructions. 

## 2015-12-16 NOTE — Progress Notes (Signed)
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 Seen for possible CVA symptoms in 10/2011, but MRI showed no evidence of CVA. BMI is 31.   PCP is listed as Dr. Hollace Kinnier, last CPE exam 11/08/14 with routine EKG. Neurologist is Dr. Lanny Hurst Lucio Edward, NP.  Meds include ASA, Lipitor, lisinopril, metoprolol, Neurontin, protonix, Zoloft, Imitrex  EKG 07/27/15: Sinus rhythm. Low voltage, precordial leads. Minimal ST depression, inferior leads. Borderline prolonged QT interval. Baseline wander in lead(s) II III aVR aVL aVF V1 V5 V6  Nuclear stress test 07/13/15:  1. No reversible ischemia or infarction. 2. Normal left ventricular wall motion. 3. Left ventricular ejection fraction 70% 4. Low-risk stress test findings  Echo 07/12/15:  - Left ventricle: The cavity size was normal. Wall thickness was normal. Systolic function was vigorous. The estimated ejection fraction was in the range of 65% to 70%. Wall motion was normal; there were no regional wall motion abnormalities. Doppler parameters are consistent with abnormal left ventricular relaxation (grade 1 diastolic dysfunction). - Aortic valve: Poorly visualized. There was no stenosis. - Mitral valve: There was no significant regurgitation. - Right ventricle: The cavity size was normal. Systolic function was normal. - Pulmonary arteries: No complete TR doppler jet so unable to estimate PA systolic pressure. - Inferior vena cava: The vessel was normal in size. The respirophasic diameter changes were in the normal range (>=  50%), consistent with normal central venous pressure. - Pericardium, extracardiac: Small pericardial effusion.  10/12/11 Carotid duplex: No significant extracranial carotid artery stenosis demonstrated. Vertebrals are patent with antegrade flow.  She will need labs on arrival. If labs acceptable, I anticipate pt can proceed as scheduled.   Willeen Cass, FNP-BC [facility]/Anesthesiology Phone: [phone] 12/16/2015 12:11 PM

## 2015-12-19 ENCOUNTER — Ambulatory Visit (HOSPITAL_COMMUNITY): Payer: Medicare Other | Admitting: Emergency Medicine

## 2015-12-19 ENCOUNTER — Ambulatory Visit (HOSPITAL_COMMUNITY): Payer: Medicare Other

## 2015-12-19 ENCOUNTER — Encounter (HOSPITAL_COMMUNITY)
Admission: AD | Disposition: A | Discharge status: Home / Self Care | Payer: Self-pay | Source: Ambulatory Visit | Attending: Neurological Surgery

## 2015-12-19 ENCOUNTER — Encounter (HOSPITAL_COMMUNITY): Payer: Self-pay | Admitting: *Deleted

## 2015-12-19 ENCOUNTER — Inpatient Hospital Stay (HOSPITAL_COMMUNITY)
Admission: AD | Admit: 2015-12-19 | Discharge: 2015-12-21 | DRG: 520 | Disposition: A | Discharge status: Home / Self Care | Payer: Medicare Other | Source: Ambulatory Visit | Attending: Neurological Surgery | Admitting: Neurological Surgery

## 2015-12-19 DIAGNOSIS — I10 Essential (primary) hypertension: Secondary | ICD-10-CM | POA: Diagnosis not present

## 2015-12-19 DIAGNOSIS — Z888 Allergy status to other drugs, medicaments and biological substances status: Secondary | ICD-10-CM | POA: Diagnosis not present

## 2015-12-19 DIAGNOSIS — G2581 Restless legs syndrome: Secondary | ICD-10-CM | POA: Diagnosis not present

## 2015-12-19 DIAGNOSIS — Z96652 Presence of left artificial knee joint: Secondary | ICD-10-CM | POA: Diagnosis not present

## 2015-12-19 DIAGNOSIS — Z981 Arthrodesis status: Secondary | ICD-10-CM | POA: Diagnosis not present

## 2015-12-19 DIAGNOSIS — M5116 Intervertebral disc disorders with radiculopathy, lumbar region: Secondary | ICD-10-CM | POA: Diagnosis not present

## 2015-12-19 DIAGNOSIS — R110 Nausea: Secondary | ICD-10-CM | POA: Diagnosis not present

## 2015-12-19 DIAGNOSIS — M419 Scoliosis, unspecified: Secondary | ICD-10-CM | POA: Diagnosis not present

## 2015-12-19 DIAGNOSIS — G4730 Sleep apnea, unspecified: Secondary | ICD-10-CM | POA: Diagnosis not present

## 2015-12-19 DIAGNOSIS — Z90710 Acquired absence of both cervix and uterus: Secondary | ICD-10-CM | POA: Diagnosis not present

## 2015-12-19 DIAGNOSIS — Z419 Encounter for procedure for purposes other than remedying health state, unspecified: Secondary | ICD-10-CM

## 2015-12-19 DIAGNOSIS — F329 Major depressive disorder, single episode, unspecified: Secondary | ICD-10-CM | POA: Diagnosis not present

## 2015-12-19 DIAGNOSIS — M1990 Unspecified osteoarthritis, unspecified site: Secondary | ICD-10-CM | POA: Diagnosis not present

## 2015-12-19 DIAGNOSIS — K219 Gastro-esophageal reflux disease without esophagitis: Secondary | ICD-10-CM | POA: Diagnosis not present

## 2015-12-19 DIAGNOSIS — M5126 Other intervertebral disc displacement, lumbar region: Secondary | ICD-10-CM | POA: Diagnosis present

## 2015-12-19 DIAGNOSIS — Z91041 Radiographic dye allergy status: Secondary | ICD-10-CM | POA: Diagnosis not present

## 2015-12-19 DIAGNOSIS — G629 Polyneuropathy, unspecified: Secondary | ICD-10-CM | POA: Diagnosis not present

## 2015-12-19 DIAGNOSIS — F419 Anxiety disorder, unspecified: Secondary | ICD-10-CM | POA: Diagnosis not present

## 2015-12-19 DIAGNOSIS — M549 Dorsalgia, unspecified: Secondary | ICD-10-CM | POA: Diagnosis not present

## 2015-12-19 DIAGNOSIS — E785 Hyperlipidemia, unspecified: Secondary | ICD-10-CM | POA: Diagnosis not present

## 2015-12-19 DIAGNOSIS — Z882 Allergy status to sulfonamides status: Secondary | ICD-10-CM | POA: Diagnosis not present

## 2015-12-19 DIAGNOSIS — R42 Dizziness and giddiness: Secondary | ICD-10-CM | POA: Diagnosis not present

## 2015-12-19 DIAGNOSIS — Z96653 Presence of artificial knee joint, bilateral: Secondary | ICD-10-CM | POA: Diagnosis not present

## 2015-12-19 DIAGNOSIS — M5416 Radiculopathy, lumbar region: Secondary | ICD-10-CM | POA: Diagnosis not present

## 2015-12-19 HISTORY — PX: LUMBAR LAMINECTOMY/DECOMPRESSION MICRODISCECTOMY: SHX5026

## 2015-12-19 HISTORY — DX: Radiculopathy, lumbar region: M54.16

## 2015-12-19 LAB — SURGICAL PCR SCREEN
MRSA, PCR: NEGATIVE
Staphylococcus aureus: NEGATIVE

## 2015-12-19 LAB — BASIC METABOLIC PANEL
Anion gap: 10 (ref 5–15)
BUN: 13 mg/dL (ref 6–20)
CHLORIDE: 107 mmol/L (ref 101–111)
CO2: 22 mmol/L (ref 22–32)
Calcium: 9.8 mg/dL (ref 8.9–10.3)
Creatinine, Ser: 0.81 mg/dL (ref 0.44–1.00)
GFR calc Af Amer: >60 mL/min (ref >60)
GFR calc non Af Amer: >60 mL/min (ref >60)
GLUCOSE: 116 mg/dL — AB (ref 65–99)
POTASSIUM: 3.3 mmol/L — AB (ref 3.5–5.1)
Sodium: 139 mmol/L (ref 135–145)

## 2015-12-19 LAB — CBC
HCT: 43.9 % (ref 36.0–46.0)
HEMOGLOBIN: 13.9 g/dL (ref 12.0–15.0)
MCH: 30.2 pg (ref 26.0–34.0)
MCHC: 31.7 g/dL (ref 30.0–36.0)
MCV: 95.4 fL (ref 78.0–100.0)
PLATELETS: 363 10*3/uL (ref 150–400)
RBC: 4.6 MIL/uL (ref 3.87–5.11)
RDW: 15 % (ref 11.5–15.5)
WBC: 9.2 10*3/uL (ref 4.0–10.5)

## 2015-12-19 SURGERY — LUMBAR LAMINECTOMY/DECOMPRESSION MICRODISCECTOMY 1 LEVEL
Anesthesia: General | Site: Back | Laterality: Right

## 2015-12-19 MED ORDER — POLYETHYLENE GLYCOL 3350 17 G PO PACK: 17.0000 g | PACK | Freq: Every day | ORAL | Status: DC | PRN

## 2015-12-19 MED ORDER — MUPIROCIN 2 % EX OINT
1.0000 "application " | TOPICAL_OINTMENT | Freq: Once | CUTANEOUS | Status: AC
  Administered 2015-12-19: 1 via TOPICAL

## 2015-12-19 MED ORDER — 0.9 % SODIUM CHLORIDE (POUR BTL) OPTIME
TOPICAL | Status: DC | PRN
  Administered 2015-12-19: 1000 mL

## 2015-12-19 MED ORDER — FENTANYL CITRATE (PF) 250 MCG/5ML IJ SOLN
INTRAMUSCULAR | Status: AC
  Filled 2015-12-19: qty 5

## 2015-12-19 MED ORDER — SERTRALINE HCL 50 MG PO TABS
100.0000 mg | ORAL_TABLET | Freq: Every day | ORAL | Status: DC
  Administered 2015-12-20 – 2015-12-21 (×2): 100 mg via ORAL
  Filled 2015-12-19 (×2): qty 2

## 2015-12-19 MED ORDER — METHOCARBAMOL 500 MG PO TABS
500.0000 mg | ORAL_TABLET | Freq: Four times a day (QID) | ORAL | Status: DC | PRN
  Administered 2015-12-19: 500 mg via ORAL

## 2015-12-19 MED ORDER — MUPIROCIN 2 % EX OINT
TOPICAL_OINTMENT | CUTANEOUS | Status: AC
  Filled 2015-12-19: qty 22

## 2015-12-19 MED ORDER — PHENOL 1.4 % MT LIQD: 1.0000 | OROMUCOSAL | Status: DC | PRN

## 2015-12-19 MED ORDER — ROCURONIUM BROMIDE 100 MG/10ML IV SOLN
INTRAVENOUS | Status: DC | PRN
  Administered 2015-12-19: 50 mg via INTRAVENOUS

## 2015-12-19 MED ORDER — PROPOFOL 10 MG/ML IV BOLUS
INTRAVENOUS | Status: AC
  Filled 2015-12-19: qty 20

## 2015-12-19 MED ORDER — ACETAMINOPHEN 650 MG RE SUPP: 650.0000 mg | RECTAL | Status: DC | PRN

## 2015-12-19 MED ORDER — OXYCODONE HCL 5 MG PO TABS
ORAL_TABLET | ORAL | Status: AC
  Filled 2015-12-19: qty 1

## 2015-12-19 MED ORDER — KETOROLAC TROMETHAMINE 15 MG/ML IJ SOLN
15.0000 mg | Freq: Four times a day (QID) | INTRAMUSCULAR | Status: AC
  Administered 2015-12-19 – 2015-12-20 (×5): 15 mg via INTRAVENOUS
  Filled 2015-12-19 (×2): qty 1

## 2015-12-19 MED ORDER — METHOCARBAMOL 1000 MG/10ML IJ SOLN
500.0000 mg | Freq: Four times a day (QID) | INTRAMUSCULAR | Status: DC | PRN
  Filled 2015-12-19: qty 5

## 2015-12-19 MED ORDER — HYDROMORPHONE HCL 1 MG/ML IJ SOLN
0.2500 mg | INTRAMUSCULAR | Status: DC | PRN
Start: 2015-12-19 — End: 2015-12-19
  Administered 2015-12-19: 0.5 mg via INTRAVENOUS

## 2015-12-19 MED ORDER — LIDOCAINE HCL (CARDIAC) 20 MG/ML IV SOLN
INTRAVENOUS | Status: DC | PRN
  Administered 2015-12-19: 80 mg via INTRAVENOUS

## 2015-12-19 MED ORDER — OXYCODONE HCL 5 MG PO TABS
5.0000 mg | ORAL_TABLET | Freq: Once | ORAL | Status: AC | PRN
  Administered 2015-12-19: 5 mg via ORAL

## 2015-12-19 MED ORDER — MORPHINE SULFATE (PF) 2 MG/ML IV SOLN: 1.0000 mg | INTRAVENOUS | Status: DC | PRN

## 2015-12-19 MED ORDER — PROPOFOL 10 MG/ML IV BOLUS
INTRAVENOUS | Status: DC | PRN
  Administered 2015-12-19: 120 mg via INTRAVENOUS

## 2015-12-19 MED ORDER — SUMATRIPTAN SUCCINATE 50 MG PO TABS
50.0000 mg | ORAL_TABLET | ORAL | Status: DC | PRN
  Administered 2015-12-20: 50 mg via ORAL

## 2015-12-19 MED ORDER — LACTATED RINGERS IV SOLN
INTRAVENOUS | Status: DC
  Administered 2015-12-19 (×3): via INTRAVENOUS

## 2015-12-19 MED ORDER — DEXAMETHASONE SODIUM PHOSPHATE 10 MG/ML IJ SOLN
INTRAMUSCULAR | Status: DC | PRN
  Administered 2015-12-19: 10 mg via INTRAVENOUS

## 2015-12-19 MED ORDER — LIDOCAINE 2% (20 MG/ML) 5 ML SYRINGE
INTRAMUSCULAR | Status: AC
  Filled 2015-12-19: qty 5

## 2015-12-19 MED ORDER — ACETAMINOPHEN 325 MG PO TABS
650.0000 mg | ORAL_TABLET | ORAL | Status: DC | PRN
  Filled 2015-12-19: qty 2

## 2015-12-19 MED ORDER — ONDANSETRON HCL 4 MG/2ML IJ SOLN
4.0000 mg | INTRAMUSCULAR | Status: DC | PRN
  Administered 2015-12-20 – 2015-12-21 (×3): 4 mg via INTRAVENOUS
  Filled 2015-12-19 (×3): qty 2

## 2015-12-19 MED ORDER — ROCURONIUM BROMIDE 50 MG/5ML IV SOLN
INTRAVENOUS | Status: AC
  Filled 2015-12-19: qty 1

## 2015-12-19 MED ORDER — MIDAZOLAM HCL 5 MG/5ML IJ SOLN
INTRAMUSCULAR | Status: DC | PRN
  Administered 2015-12-19 (×2): 1 mg via INTRAVENOUS

## 2015-12-19 MED ORDER — SUGAMMADEX SODIUM 200 MG/2ML IV SOLN
INTRAVENOUS | Status: DC | PRN
  Administered 2015-12-19: 169.6 mg via INTRAVENOUS

## 2015-12-19 MED ORDER — SODIUM CHLORIDE 0.9 % IR SOLN
Status: DC | PRN
  Administered 2015-12-19: 14:00:00

## 2015-12-19 MED ORDER — METHOCARBAMOL 500 MG PO TABS
ORAL_TABLET | ORAL | Status: AC
Start: 2015-12-19 — End: 2015-12-20
  Filled 2015-12-19: qty 1

## 2015-12-19 MED ORDER — METOPROLOL TARTRATE 25 MG PO TABS
50.0000 mg | ORAL_TABLET | Freq: Two times a day (BID) | ORAL | Status: DC
  Administered 2015-12-19 – 2015-12-21 (×4): 50 mg via ORAL
  Filled 2015-12-19 (×4): qty 2

## 2015-12-19 MED ORDER — OXYCODONE-ACETAMINOPHEN 5-325 MG PO TABS
1.0000 | ORAL_TABLET | ORAL | Status: DC | PRN
  Administered 2015-12-19 (×2): 2 via ORAL
  Administered 2015-12-20 (×3): 1 via ORAL
  Administered 2015-12-21: 2 via ORAL
  Administered 2015-12-21: 1 via ORAL
  Administered 2015-12-21: 2 via ORAL
  Filled 2015-12-19: qty 1
  Filled 2015-12-19 (×3): qty 2
  Filled 2015-12-19: qty 1
  Filled 2015-12-19 (×3): qty 2
  Filled 2015-12-19: qty 1

## 2015-12-19 MED ORDER — FENTANYL CITRATE (PF) 100 MCG/2ML IJ SOLN
INTRAMUSCULAR | Status: DC | PRN
  Administered 2015-12-19: 50 ug via INTRAVENOUS
  Administered 2015-12-19 (×2): 100 ug via INTRAVENOUS

## 2015-12-19 MED ORDER — OXYCODONE HCL 5 MG/5ML PO SOLN: 5.0000 mg | Freq: Once | ORAL | Status: AC | PRN

## 2015-12-19 MED ORDER — SODIUM CHLORIDE 0.9% FLUSH: 3.0000 mL | INTRAVENOUS | Status: DC | PRN

## 2015-12-19 MED ORDER — ONDANSETRON HCL 4 MG/2ML IJ SOLN
INTRAMUSCULAR | Status: DC | PRN
  Administered 2015-12-19: 4 mg via INTRAVENOUS

## 2015-12-19 MED ORDER — LISINOPRIL 5 MG PO TABS
5.0000 mg | ORAL_TABLET | Freq: Every day | ORAL | Status: DC
  Administered 2015-12-20 – 2015-12-21 (×2): 5 mg via ORAL
  Filled 2015-12-19 (×2): qty 1

## 2015-12-19 MED ORDER — THROMBIN 5000 UNITS EX SOLR
CUTANEOUS | Status: DC | PRN
  Administered 2015-12-19 (×2): 5000 [IU] via TOPICAL

## 2015-12-19 MED ORDER — HYDROCODONE-ACETAMINOPHEN 5-325 MG PO TABS: 1.0000 | ORAL_TABLET | ORAL | Status: DC | PRN

## 2015-12-19 MED ORDER — EPHEDRINE SULFATE 50 MG/ML IJ SOLN
INTRAMUSCULAR | Status: DC | PRN
  Administered 2015-12-19: 10 mg via INTRAVENOUS

## 2015-12-19 MED ORDER — SODIUM CHLORIDE 0.9% FLUSH
3.0000 mL | Freq: Two times a day (BID) | INTRAVENOUS | Status: DC
  Administered 2015-12-19 – 2015-12-20 (×3): 3 mL via INTRAVENOUS

## 2015-12-19 MED ORDER — PHENYLEPHRINE HCL 10 MG/ML IJ SOLN
INTRAMUSCULAR | Status: DC | PRN
  Administered 2015-12-19: 80 ug via INTRAVENOUS

## 2015-12-19 MED ORDER — ATORVASTATIN CALCIUM 20 MG PO TABS
40.0000 mg | ORAL_TABLET | Freq: Every day | ORAL | Status: DC
Start: 2015-12-19 — End: 2015-12-21
  Administered 2015-12-20: 40 mg via ORAL
  Filled 2015-12-19: qty 2

## 2015-12-19 MED ORDER — GABAPENTIN 400 MG PO CAPS
400.0000 mg | ORAL_CAPSULE | Freq: Four times a day (QID) | ORAL | Status: DC
  Administered 2015-12-19 – 2015-12-21 (×7): 400 mg via ORAL
  Filled 2015-12-19 (×7): qty 1

## 2015-12-19 MED ORDER — SENNA 8.6 MG PO TABS
1.0000 | ORAL_TABLET | Freq: Two times a day (BID) | ORAL | Status: DC
  Administered 2015-12-19 – 2015-12-20 (×3): 8.6 mg via ORAL
  Filled 2015-12-19 (×3): qty 1

## 2015-12-19 MED ORDER — MIDAZOLAM HCL 2 MG/2ML IJ SOLN
INTRAMUSCULAR | Status: AC
  Filled 2015-12-19: qty 2

## 2015-12-19 MED ORDER — DEXAMETHASONE SODIUM PHOSPHATE 10 MG/ML IJ SOLN
INTRAMUSCULAR | Status: AC
  Filled 2015-12-19: qty 1

## 2015-12-19 MED ORDER — PROMETHAZINE HCL 25 MG/ML IJ SOLN
6.2500 mg | INTRAMUSCULAR | Status: DC | PRN
Start: 2015-12-19 — End: 2015-12-19

## 2015-12-19 MED ORDER — SALINE SPRAY 0.65 % NA SOLN: 1.0000 | NASAL | Status: DC | PRN

## 2015-12-19 MED ORDER — HYDROMORPHONE HCL 1 MG/ML IJ SOLN
INTRAMUSCULAR | Status: AC
  Administered 2015-12-19: 0.5 mg via INTRAVENOUS
  Filled 2015-12-19: qty 1

## 2015-12-19 MED ORDER — DOCUSATE SODIUM 100 MG PO CAPS
100.0000 mg | ORAL_CAPSULE | Freq: Two times a day (BID) | ORAL | Status: DC
  Administered 2015-12-19 – 2015-12-20 (×3): 100 mg via ORAL
  Filled 2015-12-19 (×3): qty 1

## 2015-12-19 MED ORDER — HEMOSTATIC AGENTS (NO CHARGE) OPTIME
TOPICAL | Status: DC | PRN
  Administered 2015-12-19: 1 via TOPICAL

## 2015-12-19 MED ORDER — BUPIVACAINE HCL (PF) 0.5 % IJ SOLN
INTRAMUSCULAR | Status: DC | PRN
  Administered 2015-12-19: 25 mL

## 2015-12-19 MED ORDER — MENTHOL 3 MG MT LOZG: 1.0000 | LOZENGE | OROMUCOSAL | Status: DC | PRN

## 2015-12-19 SURGICAL SUPPLY — 60 items
ADH SKN CLS APL DERMABOND .7 (GAUZE/BANDAGES/DRESSINGS) ×1
BAG DECANTER FOR FLEXI CONT (MISCELLANEOUS) ×3 IMPLANT
BLADE CLIPPER SURG (BLADE) IMPLANT
BUR ACORN 6.0 (BURR) IMPLANT
BUR ACORN 6.0MM (BURR)
BUR MATCHSTICK NEURO 3.0 LAGG (BURR) ×3 IMPLANT
CANISTER SUCT 3000ML PPV (MISCELLANEOUS) ×3 IMPLANT
DECANTER SPIKE VIAL GLASS SM (MISCELLANEOUS) ×3 IMPLANT
DERMABOND ADVANCED (GAUZE/BANDAGES/DRESSINGS) ×2
DERMABOND ADVANCED .7 DNX12 (GAUZE/BANDAGES/DRESSINGS) ×1 IMPLANT
DRAPE LAPAROTOMY T 102X78X121 (DRAPES) ×3 IMPLANT
DRAPE MICROSCOPE LEICA (MISCELLANEOUS) ×2 IMPLANT
DRAPE POUCH INSTRU U-SHP 10X18 (DRAPES) ×3 IMPLANT
DRAPE PROXIMA HALF (DRAPES) IMPLANT
DRSG OPSITE POSTOP 4X6 (GAUZE/BANDAGES/DRESSINGS) ×4 IMPLANT
DURAPREP 26ML APPLICATOR (WOUND CARE) ×3 IMPLANT
ELECT REM PT RETURN 9FT ADLT (ELECTROSURGICAL) ×3
ELECTRODE REM PT RTRN 9FT ADLT (ELECTROSURGICAL) ×1 IMPLANT
GAUZE SPONGE 4X4 12PLY STRL (GAUZE/BANDAGES/DRESSINGS) ×1 IMPLANT
GAUZE SPONGE 4X4 16PLY XRAY LF (GAUZE/BANDAGES/DRESSINGS) IMPLANT
GLOVE BIO SURGEON STRL SZ8 (GLOVE) ×2 IMPLANT
GLOVE BIOGEL PI IND STRL 7.0 (GLOVE) IMPLANT
GLOVE BIOGEL PI IND STRL 7.5 (GLOVE) IMPLANT
GLOVE BIOGEL PI IND STRL 8.5 (GLOVE) ×1 IMPLANT
GLOVE BIOGEL PI INDICATOR 7.0 (GLOVE) ×2
GLOVE BIOGEL PI INDICATOR 7.5 (GLOVE) ×2
GLOVE BIOGEL PI INDICATOR 8.5 (GLOVE) ×4
GLOVE ECLIPSE 8.5 STRL (GLOVE) ×5 IMPLANT
GLOVE EXAM NITRILE LRG STRL (GLOVE) IMPLANT
GLOVE EXAM NITRILE MD LF STRL (GLOVE) IMPLANT
GLOVE EXAM NITRILE XL STR (GLOVE) IMPLANT
GLOVE EXAM NITRILE XS STR PU (GLOVE) IMPLANT
GLOVE INDICATOR 8.5 STRL (GLOVE) ×2 IMPLANT
GLOVE SS BIOGEL STRL SZ 6.5 (GLOVE) IMPLANT
GLOVE SUPERSENSE BIOGEL SZ 6.5 (GLOVE) ×2
GLOVE SURG SS PI 6.5 STRL IVOR (GLOVE) ×6 IMPLANT
GLOVE SURG SS PI 7.0 STRL IVOR (GLOVE) ×2 IMPLANT
GOWN STRL REUS W/ TWL LRG LVL3 (GOWN DISPOSABLE) IMPLANT
GOWN STRL REUS W/ TWL XL LVL3 (GOWN DISPOSABLE) IMPLANT
GOWN STRL REUS W/TWL 2XL LVL3 (GOWN DISPOSABLE) ×5 IMPLANT
GOWN STRL REUS W/TWL LRG LVL3 (GOWN DISPOSABLE) ×3
GOWN STRL REUS W/TWL XL LVL3 (GOWN DISPOSABLE) ×6
KIT BASIN OR (CUSTOM PROCEDURE TRAY) ×3 IMPLANT
KIT ROOM TURNOVER OR (KITS) ×3 IMPLANT
NDL SPNL 20GX3.5 QUINCKE YW (NEEDLE) IMPLANT
NEEDLE HYPO 22GX1.5 SAFETY (NEEDLE) ×3 IMPLANT
NEEDLE SPNL 20GX3.5 QUINCKE YW (NEEDLE) ×3 IMPLANT
NS IRRIG 1000ML POUR BTL (IV SOLUTION) ×3 IMPLANT
PACK LAMINECTOMY NEURO (CUSTOM PROCEDURE TRAY) ×3 IMPLANT
PAD ARMBOARD 7.5X6 YLW CONV (MISCELLANEOUS) ×9 IMPLANT
PATTIES SURGICAL .5 X1 (DISPOSABLE) ×1 IMPLANT
RUBBERBAND STERILE (MISCELLANEOUS) ×4 IMPLANT
SPONGE SURGIFOAM ABS GEL SZ50 (HEMOSTASIS) ×3 IMPLANT
SUT VIC AB 1 CT1 18XBRD ANBCTR (SUTURE) ×1 IMPLANT
SUT VIC AB 1 CT1 8-18 (SUTURE) ×3
SUT VIC AB 2-0 CP2 18 (SUTURE) ×3 IMPLANT
SUT VIC AB 3-0 SH 8-18 (SUTURE) ×3 IMPLANT
TOWEL OR 17X24 6PK STRL BLUE (TOWEL DISPOSABLE) ×3 IMPLANT
TOWEL OR 17X26 10 PK STRL BLUE (TOWEL DISPOSABLE) ×3 IMPLANT
WATER STERILE IRR 1000ML POUR (IV SOLUTION) ×3 IMPLANT

## 2015-12-19 NOTE — Evaluation (Signed)
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  Clinical Impression  Pt admitted with/for microdiskectomy L23.  Pt currently limited functionally due to the problems listed below.  (see problems list.)  Pt will benefit from PT to maximize function and safety to be able to get home safely with available assist .     Follow Up Recommendations No PT follow up;Supervision - Intermittent    Equipment Recommendations  None recommended by PT    Recommendations for Other Services       Precautions / Restrictions Precautions Precautions: Back Required Braces or Orthoses: Spinal Brace Spinal Brace: Lumbar corset;Applied in sitting position Restrictions Weight Bearing Restrictions: No      Mobility  Bed Mobility Overal bed mobility: Needs Assistance Bed Mobility: Sidelying to Sit;Sit to Sidelying   Sidelying to sit: Supervision     Sit to sidelying: Supervision General bed mobility comments: critiqued technique  Transfers Overall transfer level: Needs assistance Equipment used: Rolling walker (2 wheeled);None Transfers: Sit to/from Stand Sit to Stand: Supervision            Ambulation/Gait Ambulation/Gait assistance: Supervision;Min guard (min guard without RW) Ambulation Distance (Feet): 200 Feet Assistive device: Rolling walker (2 wheeled);None Gait Pattern/deviations: Step-through pattern Gait velocity: slower   General Gait Details: more antalgic without RW, With RW she is steady  Stairs Stairs: Yes Stairs assistance: Min guard Stair Management: Two rails;Forwards Number of Stairs: 1 (x2) General stair comments: effortful and painful, but safe  Wheelchair Mobility    Modified Rankin (Stroke Patients Only)       Balance Overall balance assessment: No apparent  balance deficits (not formally assessed)                                           Pertinent Vitals/Pain Pain Assessment: Faces Faces Pain Scale: Hurts a little bit Pain Location: incisional pain Pain Descriptors / Indicators: Grimacing Pain Intervention(s): Monitored during session    Home Living Family/patient expects to be discharged to:: Private residence Living Arrangements: Alone Available Help at Discharge: Family;Neighbor Type of Home: House Home Access: Stairs to enter Entrance Stairs-Rails: None Entrance Stairs-Number of Steps: 1 threshold  Home Layout: One level Home Equipment: Environmental consultant - 2 wheels;Walker - 4 wheels;Shower seat;Shower seat - built in;Wheelchair - manual;Toilet riser Additional Comments: life alert; pt has lift chair in bathtub    Prior Function Level of Independence: Independent with assistive device(s)         Comments: Sons have been helping with grocery shopping. Has someone assist with cleaning twice/week.      Hand Dominance   Dominant Hand: Right    Extremity/Trunk Assessment   Upper Extremity Assessment: Defer to OT evaluation           Lower Extremity Assessment: Overall WFL for tasks assessed (mild proximal weakness)         Communication   Communication: No difficulties  Cognition Arousal/Alertness: Awake/alert Behavior During Therapy: WFL for tasks assessed/performed Overall Cognitive Status: Within Functional Limits for tasks assessed                      General Comments General comments (skin integrity, edema, etc.): Reinforced  in back care/prec, log roll, lifting restrictions, bracing issues, and progression of activity.    Exercises        Assessment/Plan    PT Assessment Patient needs continued PT services  PT Diagnosis Acute pain;Difficulty walking   PT Problem List Decreased strength;Decreased activity tolerance;Decreased mobility;Decreased knowledge of use of DME;Decreased  knowledge of precautions  PT Treatment Interventions Gait training;Functional mobility training;Therapeutic activities;DME instruction;Patient/family education   PT Goals (Current goals can be found in the Care Plan section) Acute Rehab PT Goals PT Goal Formulation: All assessment and education complete, DC therapy    Frequency Min 3X/week   Barriers to discharge Decreased caregiver support      Co-evaluation               End of Session Equipment Utilized During Treatment: Back brace Activity Tolerance: Patient tolerated treatment well Patient left: in bed;with call bell/phone within reach Nurse Communication: Mobility status         Time: [id number] PT Time Calculation (min) (ACUTE ONLY): 27 min   Charges:   PT Evaluation $PT Eval Low Complexity: 1 Procedure PT Treatments $Gait Training: 8-22 mins   PT G Codes:        [place] Noori, Tessie Fass 12/19/2015, 6:45 PM 12/19/2015  Donnella Sham, [place] [phone]  (pager)

## 2015-12-19 NOTE — H&P (Signed)
[name] is an 75 y.o. female.   Chief Complaint: Back and right lower extremity pain HPI: Patient is a 75 year old individual who's had a previous disc herniation in the lower lumbar spine operated on little over a year ago. She did well for a brief period of time but inserted having increasing pain and recent MRI demonstrated presence of a herniated nucleus pulposus at L2-3 on the right side. [name] treated conservatively however she's had severe unrelenting pain is now admitted to undergo a microdiscectomy at L2-3 on the right side. Patient has been requesting an requiring substantial doses of pain medication which have failed to give her adequate pain control.  Past Medical History  Diagnosis Date  . GERD (gastroesophageal reflux disease)     hx pud '98  . Arthritis     djd  . Restless leg syndrome     takes Sinemet daily  . 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 Dysrhythmia     hx tachy palpitations  . Neuromuscular disorder ([place])     peripheral neuropathy, FEET AND LEGS  . Stroke [facility])     ? 6 months ago - tests okay - [facility]   . Cellulitis     10/11/11 hospitalized for cellulitis   . Hyperlipemia     takes Atorvastatin daily  . Peripheral neuropathy ([place])   . Elbow fracture, left April 2015  . Depression     takes Zoloft daily  . Vertigo   . Insomnia   . Hypertension     takes HCTZ daily  . Peripheral neuropathy ([facility])     takes Gabapentin daily  . History of bronchitis     many yrs ago  . Headache(784.0)     migraines yrs ago  . Joint pain   . Joint swelling   . Chronic back pain     scoliosis/stenosis  . Anxiety   . Cataracts, bilateral     immature   . Depression   . Radiculopathy, lumbar region     Past Surgical History  Procedure Laterality Date  . Cervical disc surgery x2  2011  . Rt carotid enarterectomy  2011  . Rt knee arthroscopy  '99  . Left knee arthroscopy  2001  . Hematoma evacuation  2011    s/p rt cea  . Joint replacement   '01    total knee replacement, LEFT  . Abdominal hysterectomy  1975    bil oophorectomy  . Back surgery   MANY YRS AGO    laminectomy x3  . Cholecystectomy  1993  . Total knee arthroplasty  09/13/2011    Procedure: TOTAL KNEE ARTHROPLASTY;  Surgeon: Magnus Sinning, MD;  Location: [facility];  Service: Orthopedics;  Laterality: Right;  . Right knee replacement      09/2011   . Carpal tunnel release  07/09/2012    Procedure: CARPAL TUNNEL RELEASE;  Surgeon: Magnus Sinning, MD;  Location: [facility];  Service: Orthopedics;  Laterality: Left;  . Finger arthroplasty  07/09/2012    Procedure: FINGER ARTHROPLASTY;  Surgeon: Magnus Sinning, MD;  Location: [facility];  Service: Orthopedics;  Laterality: Left;  Interposition Arthroplasty [facility] Joint Thumb Left   . Lipoma excision  07/09/2012    Procedure: EXCISION LIPOMA;  Surgeon: Magnus Sinning, MD;  Location: [facility];  Service: Orthopedics;  Laterality: Left;  Excision Lipoma Dorsum Left Wrist   . Orif ankle fracture Right 09/14/2014  FIBULA   . Orif ankle fracture Right 09/14/2014    Procedure: OPEN REDUCTION INTERNAL FIXATION (ORIF) RIGHT ANKLE FRACTURE/SYNDESMOSIS ;  Surgeon: Wylene Simmer, MD;  Location: [place];  Service: Orthopedics;  Laterality: Right;  . Colonosocpy    . Esophagogastroduodenoscopy    . Lumbar laminectomy with coflex 1 level Bilateral 04/19/2015    Procedure: LUMBAR TWO-THREE LUMBAR LAMINECTOMY WITH COFLEX ;  Surgeon: Kristeen Miss, MD;  Location: [place] [facility];  Service: Neurosurgery;  Laterality: Bilateral;  Bilateral L23 laminectomy and foraminotomy with coflex    Family History  Problem Relation Age of Onset  . Congestive Heart Failure Father   . Congestive Heart Failure Sister   . Alzheimer's disease Mother   . Diabetes Brother   . Arthritis Brother    Social History:  reports that she has never smoked. She has never used smokeless tobacco. She reports that she does not drink alcohol or use illicit drugs.  Allergies:   Allergies  Allergen Reactions  . Contrast Media [Iodinated Diagnostic Agents] Other (See Comments)    Respiratory failure  . Acyclovir And Related Other (See Comments)    unknown  . Sulfa Drugs Cross Reactors Nausea And Vomiting    No prescriptions prior to admission    No results found for this or any previous visit (from the past 48 hour(s)). No results found.  Review of Systems  HENT: Negative.   Eyes: Negative.   Cardiovascular: Negative.   Gastrointestinal: Negative.   Musculoskeletal: Positive for back pain.  Skin: Negative.   Neurological: Positive for weakness.       Pain and weakness in the right lower extremity  Endo/Heme/Allergies: Negative.   Psychiatric/Behavioral: Positive for substance abuse.    There were no vitals taken for this visit. Physical Exam  Constitutional: [name] oriented to person, place, and time. She appears well-developed and well-nourished.  HENT:  Head: Normocephalic and atraumatic.  Eyes: Conjunctivae and EOM are normal. Pupils are equal, round, and reactive to light.  Neck: Normal range of motion. Neck supple.  Musculoskeletal:  Positive straight leg raising on the right side at 15. Weakness in iliopsoas and quadriceps on the right side  Neurological: [name] alert and oriented to person, place, and time.  Skin: Skin is warm and dry.  Psychiatric: She has a normal mood and affect. Her behavior is normal. Judgment and thought content normal.     Assessment/Plan Herniated nucleus pulposus L2-L3 right  Microdiscectomy L2-L3 right  Earleen Newport, MD 12/19/2015, 7:52 AM

## 2015-12-19 NOTE — Anesthesia Preprocedure Evaluation (Addendum)
Anesthesia Evaluation  Patient identified by MRN, date of birth, ID band Patient awake    Reviewed: Allergy & Precautions, H&P , NPO status , Patient's Chart, lab work & pertinent test results  History of Anesthesia Complications Negative for: history of anesthetic complications  Airway Mallampati: II  TM Distance: >3 FB Neck ROM: full    Dental  (+) Implants,  Missing left upper crown preop:   Pulmonary sleep apnea ,    Pulmonary exam normal breath sounds clear to auscultation (-) decreased breath sounds      Cardiovascular hypertension, + Peripheral Vascular Disease  Normal cardiovascular exam+ dysrhythmias  Rhythm:regular Rate:Normal     Neuro/Psych  Headaches, Anxiety Depression TIA Neuromuscular disease    GI/Hepatic Neg liver ROS, GERD  ,  Endo/Other  negative endocrine ROS  Renal/GU negative Renal ROS     Musculoskeletal  (+) Arthritis ,   Abdominal   Peds  Hematology negative hematology ROS (+)   Anesthesia Other Findings   Reproductive/Obstetrics negative OB ROS                            Anesthesia Physical  Anesthesia Plan  ASA: III  Anesthesia Plan: General   Post-op Pain Management:    Induction: Intravenous  Airway Management Planned: Oral ETT  Additional Equipment:   Intra-op Plan:   Post-operative Plan: Extubation in OR  Informed Consent: I have reviewed the patients History and Physical, chart, labs and discussed the procedure including the risks, benefits and alternatives for the proposed anesthesia with the patient or authorized representative who has indicated his/her understanding and acceptance.   Dental Advisory Given  Plan Discussed with: Anesthesiologist, CRNA and Surgeon  Anesthesia Plan Comments:         Anesthesia Quick Evaluation

## 2015-12-19 NOTE — Transfer of Care (Signed)
Immediate Anesthesia Transfer of Care Note  Patient: [name]  Procedure(s) Performed: Procedure(s): Right Lumbar two-three  Microdiskectomy (Right)  Patient Location: PACU  Anesthesia Type:General  Level of Consciousness: awake, alert  and oriented  Airway & Oxygen Therapy: Patient Spontanous Breathing and Patient connected to nasal cannula oxygen  Post-op Assessment: Report given to RN and Post -op Vital signs reviewed and stable  Post vital signs: Reviewed and stable  Last Vitals:  Filed Vitals:   12/19/15 1443 12/19/15 1445  BP:  138/83  Pulse: 90 87  Temp: 36.3 C   Resp: 17 17    Last Pain:  Filed Vitals:   12/19/15 1451  PainSc: 0-No pain      Patients Stated Pain Goal: 1 ([id number])  Complications: No apparent anesthesia complications

## 2015-12-19 NOTE — Anesthesia Procedure Notes (Signed)
Procedure Name: Intubation Date/Time: 12/19/2015 12:49 PM Performed by: Verdie Drown Pre-anesthesia Checklist: Emergency Drugs available, Patient identified, Suction available, Patient being monitored and Timeout performed Patient Re-evaluated:Patient Re-evaluated prior to inductionOxygen Delivery Method: Circle system utilized Preoxygenation: Pre-oxygenation with 100% oxygen Intubation Type: Inhalational induction with existing ETT Ventilation: Mask ventilation without difficulty Laryngoscope Size: Miller and 2 Grade View: Grade I Tube type: Oral Tube size: 7.5 mm Number of attempts: 1 Placement Confirmation: ETT inserted through vocal cords under direct vision,  positive ETCO2 and breath sounds checked- equal and bilateral Tube secured with: Tape Dental Injury: Teeth and Oropharynx as per pre-operative assessment

## 2015-12-19 NOTE — Anesthesia Postprocedure Evaluation (Signed)
Anesthesia Post Note  Patient: [name]  Procedure(s) Performed: Procedure(s) (LRB): Right Lumbar two-three  Microdiskectomy (Right)  Patient location during evaluation: PACU Anesthesia Type: General Level of consciousness: awake and alert Pain management: pain level controlled Vital Signs Assessment: post-procedure vital signs reviewed and stable Respiratory status: spontaneous breathing, nonlabored ventilation, respiratory function stable and patient connected to nasal cannula oxygen Cardiovascular status: blood pressure returned to baseline and stable Postop Assessment: no signs of nausea or vomiting Anesthetic complications: no    Last Vitals:  Filed Vitals:   12/19/15 1552 12/19/15 1608  BP:  144/85  Pulse:  87  Temp: 36.5 C 36.5 C  Resp:  20    Last Pain:  Filed Vitals:   12/19/15 1623  PainSc: 2     LLE Motor Response: Purposeful movement;Responds to commands (12/19/15 1624) LLE Sensation: Numbness (Hx. of neuropathy) (12/19/15 1624) RLE Motor Response: Purposeful movement;Responds to commands (12/19/15 1624) RLE Sensation: Numbness (Hx. of neuropathy) (12/19/15 1624)      Zenaida Deed

## 2015-12-19 NOTE — Progress Notes (Signed)
Patient ID: [name], female   DOB: [birth year], 75 y.o.   MRN: [id number] Patient notes that postop preoperative hip pain is gone Motor function is good in the right lower extremity Notes mild to moderate back pain Has ambulated down the hallway already today

## 2015-12-19 NOTE — Op Note (Signed)
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 Indications: [name] is a 75 year old individual who about a year ago underwent surgical decompression at L2-L3 for severe stenosis. She underwent placement of a Coflex device. At the time of surgery was noted that she had a small herniated nucleus pulposus at L2-L3 on the right. This was resected. Initially the patient seemed to do well. Then the patient started to develop recurrent pain which became progressively worse. In February of this year she had an MRI that demonstrated the presence of a large recurrence of disc herniation at L2-L3 on the right. [name] been advised regarding the need for surgical decompression.  Procedure: Patient was brought to the operating room supine on a stretcher. After the smooth induction of general endotracheal anesthesia, she was turned prone. The back was prepped with alcohol and DuraPrep and draped in a sterile fashion. The old incision was reopened and this was carried down to the lumbar dorsal fascia. The fascia was opened on the right side of the midline and a subperiosteal dissection was taken out to expose the path of the Coflex device and the laminar arch of L2 any interlaminar space at L2-L3. By clearing soft tissue in this area was able to expose and enlarged laminotomy at the L2-L3 space this and while removing the inferior margin lamina of L2 out to and including the medial wall the facet. Dense adhesions to the dura were identified and these were taken down carefully with a small nerve hook and 2 mm Kerrison punch. Laminotomy was then enlarged to expose the lateral aspect of the dura just above the region of the disc space. The space was verified radiographically  albeit the Coflex device was in plain site. Then by dissecting under the thick fascial attachments at the disc space several fragments of disc were encountered in the epidural space on the right side. Several other fragments of disc were found below the dura and these were removed in a piecemeal fashion. Ultimately a good decompression of the common dural tube and the L3 nerve root inferiorly and the L2 nerve root superiorly was obtained. Hemostasis in the soft tissues obtained meticulously with the bipolar cautery. There was noted to be a fair amount of scar tissue adherent to the undersurface of the dura and dissection in this area was performed rigorously but carefully no spinal fluid leaks occurred. Once the decompression was completed the lumbar dorsal fascia was reapproximated with #1 Vicryl interrupted fashion 20 mL of half percent Marcaine was injected into the paraspinous fascia. 2-0 Vicryl was used in the subcutaneous tissues, 3-0 Vicryl subcuticularly. Irma Bond was up wide to the skin. The patient tolerated surgery well with blood loss estimated at approximately 75 mL.

## 2015-12-20 ENCOUNTER — Encounter (HOSPITAL_COMMUNITY): Payer: Self-pay | Admitting: Neurological Surgery

## 2015-12-20 DIAGNOSIS — R42 Dizziness and giddiness: Secondary | ICD-10-CM | POA: Diagnosis not present

## 2015-12-20 DIAGNOSIS — Z96652 Presence of left artificial knee joint: Secondary | ICD-10-CM | POA: Diagnosis not present

## 2015-12-20 DIAGNOSIS — M1990 Unspecified osteoarthritis, unspecified site: Secondary | ICD-10-CM | POA: Diagnosis not present

## 2015-12-20 DIAGNOSIS — G4730 Sleep apnea, unspecified: Secondary | ICD-10-CM | POA: Diagnosis not present

## 2015-12-20 DIAGNOSIS — Z888 Allergy status to other drugs, medicaments and biological substances status: Secondary | ICD-10-CM | POA: Diagnosis not present

## 2015-12-20 DIAGNOSIS — R110 Nausea: Secondary | ICD-10-CM | POA: Diagnosis not present

## 2015-12-20 DIAGNOSIS — M419 Scoliosis, unspecified: Secondary | ICD-10-CM | POA: Diagnosis not present

## 2015-12-20 DIAGNOSIS — E785 Hyperlipidemia, unspecified: Secondary | ICD-10-CM | POA: Diagnosis not present

## 2015-12-20 DIAGNOSIS — G2581 Restless legs syndrome: Secondary | ICD-10-CM | POA: Diagnosis not present

## 2015-12-20 DIAGNOSIS — Z882 Allergy status to sulfonamides status: Secondary | ICD-10-CM | POA: Diagnosis not present

## 2015-12-20 DIAGNOSIS — I10 Essential (primary) hypertension: Secondary | ICD-10-CM | POA: Diagnosis not present

## 2015-12-20 DIAGNOSIS — Z91041 Radiographic dye allergy status: Secondary | ICD-10-CM | POA: Diagnosis not present

## 2015-12-20 DIAGNOSIS — K219 Gastro-esophageal reflux disease without esophagitis: Secondary | ICD-10-CM | POA: Diagnosis not present

## 2015-12-20 DIAGNOSIS — G629 Polyneuropathy, unspecified: Secondary | ICD-10-CM | POA: Diagnosis not present

## 2015-12-20 DIAGNOSIS — Z96653 Presence of artificial knee joint, bilateral: Secondary | ICD-10-CM | POA: Diagnosis not present

## 2015-12-20 DIAGNOSIS — M5116 Intervertebral disc disorders with radiculopathy, lumbar region: Secondary | ICD-10-CM | POA: Diagnosis not present

## 2015-12-20 MED ORDER — DEXAMETHASONE 4 MG PO TABS
6.0000 mg | ORAL_TABLET | Freq: Once | ORAL | Status: AC
  Administered 2015-12-20: 6 mg via ORAL
  Filled 2015-12-20: qty 2

## 2015-12-20 NOTE — Progress Notes (Signed)
Patient ID: [name], female   DOB: 08/19/1940, 75 y.o.   MRN: [id number] Vital signs are stable Patient feels reasonably well Has nausea this afternoon Will give an additional dose of Decadron

## 2015-12-20 NOTE — Evaluation (Signed)
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   Clinical Impression   Patient evaluated by Occupational Therapy with no further acute OT needs identified. All education has been completed and the patient has no further questions. See below for any follow-up Occupational Therapy or equipment needs. OT to sign off. Thank you for referral.      Follow Up Recommendations  Home health OT    Equipment Recommendations  None recommended by OT    Recommendations for Other Services       Precautions / Restrictions Precautions Precautions: Back;Fall Precaution Booklet Issued: Yes (comment) Precaution Comments: handout provided and reviewed Required Braces or Orthoses: Spinal Brace Spinal Brace: Lumbar corset;Applied in sitting position Restrictions Weight Bearing Restrictions: No      Mobility Bed Mobility Overal bed mobility: Modified Independent             General bed mobility comments: needed cues to lower head of bed to practice like home. tp able to return demo  Transfers Overall transfer level: Needs assistance Equipment used: Rolling walker (2 wheeled);None Transfers: Sit to/from Stand Sit to Stand: Supervision         General transfer comment: VC's for hand placement on seated surface for safety. Pt with somewhat uncontrolled descent.     Balance Overall balance assessment: Needs assistance Sitting-balance support: No upper extremity supported;Feet supported Sitting balance-Leahy Scale: Fair     Standing balance support: Bilateral upper extremity supported;During functional activity Standing balance-Leahy Scale: Fair Standing balance comment: Static standing activity.                             ADL Overall ADL's : Needs assistance/impaired                     Lower Body Dressing: Min guard;Sit to/from stand Lower Body Dressing Details (indicate cue type and reason): able to don socks by crossing bil Le Toilet Transfer: Min guard;Ambulation;RW           Functional mobility during ADLs: Min guard;Rolling walker General ADL Comments: pt verbalized back precautions on arrival and reports previous surgeries.      Vision     Perception     Praxis      Pertinent Vitals/Pain Pain Assessment: 0-10 Pain Score: 3  Faces Pain Scale: Hurts a little bit Pain Location: back  Pain Descriptors / Indicators: Operative site guarding Pain Intervention(s): Premedicated before session;Repositioned;Monitored during session     Hand Dominance Right   Extremity/Trunk Assessment Upper Extremity Assessment Upper Extremity Assessment: Overall WFL for tasks assessed   Lower Extremity Assessment Lower Extremity Assessment: Defer to PT evaluation   Cervical / Trunk Assessment Cervical / Trunk Assessment: Other exceptions Cervical / Trunk Exceptions: back surg   Communication Communication Communication: No difficulties   Cognition Arousal/Alertness: Awake/alert Behavior During Therapy: WFL for tasks assessed/performed Overall Cognitive Status: Within Functional Limits for tasks assessed                     General Comments       Exercises       Shoulder Instructions      Home Living Family/patient expects to be discharged to:: Private residence Living Arrangements: Alone Available Help at  Discharge: Family;Neighbor Type of Home: House Home Access: Stairs to enter [employer] of Steps: 1 threshold  Entrance Stairs-Rails: None Home Layout: One level     Bathroom Shower/Tub: Tub/shower unit;Walk-in shower   Bathroom Toilet: Handicapped height     Home Equipment: Environmental consultant - 2 wheels;Walker - 4 wheels;Shower seat;Shower seat - built in;Wheelchair - manual;Toilet riser   Additional  Comments: life alert; pt has lift chair in bathtub      Prior Functioning/Environment Level of Independence: Independent with assistive device(s)        Comments: Sons have been helping with grocery shopping. Has someone assist with cleaning twice/week.     OT Diagnosis: Generalized weakness;Acute pain   OT Problem List: Decreased strength;Decreased activity tolerance;Impaired balance (sitting and/or standing);Decreased safety awareness;Decreased knowledge of precautions;Decreased knowledge of use of DME or AE;Pain   OT Treatment/Interventions: Self-care/ADL training;DME and/or AE instruction;Therapeutic activities;Patient/family education;Balance training    OT Goals(Current goals can be found in the care plan section) Acute Rehab OT Goals OT Goal Formulation: With patient  OT Frequency: Min 2X/week   Barriers to D/C:            Co-evaluation              End of Session Equipment Utilized During Treatment: Gait belt;Rolling walker;Back brace Nurse Communication: Mobility status;Precautions  Activity Tolerance: Patient tolerated treatment well Patient left: Other (comment) (passed to PT [name] in hallway)   [id number] OT Time Calculation (min): 12 min Charges:  OT General Charges $OT Visit: 1 Procedure OT Evaluation $OT Eval Moderate Complexity: 1 Procedure G-Codes:    [name] Jan 15, 2016, 10:10 AM    [name]   OTR/L Pager: [phone] Office: [phone] .

## 2015-12-20 NOTE — Progress Notes (Signed)
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    PT Comments    Pt progressing towards physical therapy goals. Pt was able to practice the stairs and was able to complete with min guard and step-by-step cueing for safety, balance support and maintenance of precautions. Will continue to follow and progress as able per POC.   Follow Up Recommendations  Home health PT;Supervision - Intermittent     Equipment Recommendations  None recommended by PT    Recommendations for Other Services       Precautions / Restrictions Precautions Precautions: Back;Fall Precaution Booklet Issued: Yes (comment) Precaution Comments: Reviewed handout and pt was cued for precautions during functional mobility.  Required Braces or Orthoses: Spinal Brace Spinal Brace: Lumbar corset;Applied in sitting position Restrictions Weight Bearing Restrictions: No    Mobility  Bed Mobility               General bed mobility comments: Pt received standing in hall with OT  Transfers Overall transfer level: Needs assistance Equipment used: Rolling walker (2 wheeled);None Transfers: Sit to/from Stand Sit to Stand: Supervision         General transfer comment: VC's for hand placement on seated surface for safety. Pt with somewhat uncontrolled descent.   Ambulation/Gait Ambulation/Gait assistance: Supervision Ambulation Distance (Feet): 300 Feet Assistive device: Rolling walker (2 wheeled);None Gait Pattern/deviations: Step-through pattern;Decreased stride length;Trunk flexed Gait velocity: Decreased Gait velocity interpretation: Below normal speed for age/gender General Gait Details: RW for safety and balance support. Verbal and tactile cueing for improved posture and safety throughout gait training.     Stairs Stairs: Yes Stairs assistance: Min guard Stair Management: Two rails;Step to pattern;Forwards Number of Stairs: 1 (x3) General stair comments: VC's for improved posture when stepping up as trunk was very flexed. Able to make minimal corrective cues. Continues to be very effortful however feel she will do well at home as the step in is about 1/2 the height of the step in the stairwell.   Wheelchair Mobility    Modified Rankin (Stroke Patients Only)       Balance Overall balance assessment: Needs assistance Sitting-balance support: Feet supported;No upper extremity supported Sitting balance-Leahy Scale: Fair     Standing balance support: No upper extremity supported;During functional activity Standing balance-Leahy Scale: Fair Standing balance comment: Static standing activity.                     Cognition Arousal/Alertness: Awake/alert Behavior During Therapy: WFL for tasks assessed/performed Overall Cognitive Status: Within Functional Limits for tasks assessed                      Exercises      General Comments        Pertinent Vitals/Pain Pain Assessment: Faces Faces Pain Scale: Hurts a little bit Pain Location: Incisional pain Pain Descriptors / Indicators: Operative site guarding;Sore Pain Intervention(s): Limited activity within patient's tolerance;Monitored during session;Repositioned    Home Living                      Prior Function            PT Goals (current goals can now be found in the care plan section) Acute Rehab PT Goals PT Goal Formulation: All  assessment and education complete, DC therapy Progress towards PT goals: Progressing toward goals    Frequency  Min 3X/week    PT Plan Discharge plan needs to be updated    Co-evaluation             End of Session Equipment Utilized During Treatment: Back brace Activity Tolerance: Patient tolerated treatment well Patient left: in bed;with call  bell/phone within reach     Time: 0825-0845 PT Time Calculation (min) (ACUTE ONLY): 20 min  Charges:  $Gait Training: 8-22 mins                    G Codes:      Rolinda Roan 01-08-16, 9:00 AM   Rolinda Roan, PT, DPT Acute Rehabilitation Services Pager: [phone]

## 2015-12-21 MED ORDER — MECLIZINE HCL 12.5 MG PO TABS
25.0000 mg | ORAL_TABLET | Freq: Two times a day (BID) | ORAL | Status: DC | PRN
  Administered 2015-12-21: 25 mg via ORAL
  Filled 2015-12-21: qty 2

## 2015-12-21 MED ORDER — HYDROCODONE-ACETAMINOPHEN 5-325 MG PO TABS: 1.0000 | ORAL_TABLET | ORAL | Status: DC | PRN

## 2015-12-21 NOTE — Progress Notes (Signed)
Patient alert and oriented, mae's well, voiding adequate amount of urine, swallowing without difficulty, c/o mild pain. Patient discharged home with family. Script and discharged instructions given to patient. Patient and family stated understanding of d/c instructions given and has an appointment with MD.   

## 2015-12-21 NOTE — Progress Notes (Signed)
PT Cancellation Note  Patient Details Name: [name] MRN: [id number] DOB: 1941-01-28   Cancelled Treatment:    Reason Eval/Treat Not Completed: Medical issues which prohibited therapy. Pt reports episode of vertigo and does not wish to participate with PT at this time. Pt states she has been "doing well" and feels comfortable d/c home this afternoon without seeing PT again. Per pt, she has occasional episodes of vertigo at home and states she knows how to "manage her symptoms for a few says before it passes". Will continue to follow until d/c.    Rolinda Roan 12/21/2015, 10:13 AM   Rolinda Roan, PT, DPT Acute Rehabilitation Services Pager: [phone]

## 2015-12-21 NOTE — Discharge Summary (Signed)
Physician Discharge Summary  Patient ID: [name] MRN: [id number] DOB/AGE: May 09, 1941 75 y.o.  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 She tolerated surgery well. Postoperatively she's had good relief of her symptoms. She has noted recurrence of some nausea which she attributes to her vertigo. She treats this conservatively on her own.  Consults: None  Significant Diagnostic Studies: None  Treatments: surgery: Microdiscectomy L2-3 right with operating microscope dissection technique, recurrent  Discharge Exam: Blood pressure 165/74, pulse 90, temperature 97.9 F (36.6 C), temperature source Oral, resp. rate 20, height 5\' 5"  (1.651 m), weight 84.823 kg (187 lb), SpO2 94 %. Incision is clean and dry motor function is intact. Station and gait are intact.  Disposition: 01-Home or Self Care  Discharge Instructions    Call MD for:  redness, tenderness, or signs of infection (pain, swelling, redness, odor or green/yellow discharge around incision site)    Complete by:  As directed      Call MD for:  severe uncontrolled pain    Complete by:  As directed      Call MD for:  temperature >100.4    Complete by:  As directed      Diet - low sodium heart healthy    Complete by:  As directed      Discharge instructions    Complete by:  As directed   Okay to shower. Do not apply salves or appointments to incision. No heavy lifting with the upper extremities greater than 15 pounds. May resume driving when not requiring pain medication and patient feels comfortable with doing so.     Increase activity  slowly    Complete by:  As directed             Medication List    TAKE these medications        aspirin EC 81 MG tablet  Take 1 tablet (81 mg total) by mouth daily.     atorvastatin 40 MG tablet  Commonly known as:  LIPITOR  Take one tablet by mouth once daily for cholesterol     cholecalciferol 1000 units tablet  Commonly known as:  VITAMIN D  Take 1 tablet (1,000 Units total) by mouth daily. For bone health     gabapentin 400 MG capsule  Commonly known as:  NEURONTIN  Take 1 capsule (400 mg total) by mouth 4 (four) times daily. For pain management/agitation     HYDROcodone-acetaminophen 5-325 MG tablet  Commonly known as:  NORCO/VICODIN  Take 1 tablet by mouth every 4 (four) hours as needed (mild pain).     lisinopril 5 MG tablet  Commonly known as:  PRINIVIL,ZESTRIL  Take 1 tablet (5 mg total) by mouth daily. For high blood pressure     metoprolol 50 MG tablet  Commonly known as:  LOPRESSOR  Take one tablet by mouth twice daily to control blood pressure     pyridOXINE 50 MG tablet  Commonly known as:  VITAMIN B-6  Take 1 tablet (50 mg total) by mouth daily. For Vitamin B-6 supplement     sertraline 50 MG tablet  Commonly known as:  ZOLOFT  Take 2 tablets (100 mg total) by mouth daily.  sodium chloride 0.65 % Soln nasal spray  Commonly known as:  OCEAN  Place 1 spray into both nostrils as needed for congestion.     SUMAtriptan 50 MG tablet  Commonly known as:  IMITREX  Take 1 tablet by mouth  every 2 hours as needed for migraine/headache. May  repeat in 2 hours if  headache persists or recurs         Signed: Earleen Newport 12/21/2015, 9:30 AM

## 2015-12-26 ENCOUNTER — Telehealth: Payer: Self-pay | Admitting: *Deleted

## 2015-12-26 NOTE — Telephone Encounter (Signed)
She will need to find a way to get here.  She should drink plenty of water and cranberry juice.  Then ask one of her sons to bring her in to obtain a UA c+s on Thursday if symptoms persist.  As I've explained to her in the past, our policy here is that we do not call in antibiotics.  This causes resistance and leads to unnecessary treatment.

## 2015-12-26 NOTE — Telephone Encounter (Signed)
Patient called and stated that she just had Back surgery and stated that she is having urinary frequency, burning and stinging. Patient stated that she cannot drive and has no one to bring her in. Wants to know if you will call in an antibiotic for her. Please Advise.

## 2015-12-27 NOTE — Telephone Encounter (Signed)
Spoke with patient, patient aware of Dr.[name]'s response. Patient's son's are working. Patient's neighbor recently had eye surgery. Patient states she does not have a way to get here.  Patient will continue with water, cranberry juice and AZO. Patient instructed to call if she can find a way to come into the office this week. Patient ended call after I gave instructions.

## 2015-12-29 ENCOUNTER — Other Ambulatory Visit: Payer: Self-pay | Admitting: Internal Medicine

## 2016-01-09 NOTE — Progress Notes (Signed)
OT NOTE- LATE GCODE ENTRY    12/20/15 0855  OT G-codes **NOT FOR INPATIENT CLASS**  Functional Assessment Tool Used clinical judgement  Functional Limitation Self care  Self Care Current Status [id number]) CI  Self Care Goal Status [id number]) CI          Jeri Modena   OTR/L Pager: [phone] Office: [phone] .

## 2016-01-10 NOTE — Progress Notes (Signed)
Late Entry Addendum to Initial Evaluation    12/19/15 1838  PT Time Calculation  PT Start Time (ACUTE ONLY) 1804  PT Stop Time (ACUTE ONLY) 1831  PT Time Calculation (min) (ACUTE ONLY) 27 min  PT G-Codes **NOT FOR INPATIENT CLASS**  Functional Assessment Tool Used clinical judgement  Functional Limitation Mobility: Walking and moving around  Mobility: Walking and Moving Around Current Status [id number]) CI  Mobility: Walking and Moving Around Goal Status [id number]) CH  PT General Charges  $$ ACUTE PT VISIT 1 Procedure  PT Evaluation  $PT Eval Low Complexity 1 Procedure  PT Treatments  $Gait Training 8-22 mins   01/10/2016  Donnella Sham, PT [phone]  (pager)

## 2016-01-12 ENCOUNTER — Telehealth: Payer: Self-pay | Admitting: *Deleted

## 2016-01-12 ENCOUNTER — Emergency Department (HOSPITAL_COMMUNITY): Payer: Medicare Other

## 2016-01-12 ENCOUNTER — Encounter (HOSPITAL_COMMUNITY): Payer: Self-pay | Admitting: Emergency Medicine

## 2016-01-12 ENCOUNTER — Emergency Department (HOSPITAL_COMMUNITY)
Admission: EM | Admit: 2016-01-12 | Discharge: 2016-01-12 | Disposition: A | Discharge status: Home / Self Care | Payer: Medicare Other | Attending: Emergency Medicine | Admitting: Emergency Medicine

## 2016-01-12 DIAGNOSIS — I10 Essential (primary) hypertension: Secondary | ICD-10-CM | POA: Insufficient documentation

## 2016-01-12 DIAGNOSIS — Z7982 Long term (current) use of aspirin: Secondary | ICD-10-CM | POA: Diagnosis not present

## 2016-01-12 DIAGNOSIS — D72829 Elevated white blood cell count, unspecified: Secondary | ICD-10-CM | POA: Diagnosis not present

## 2016-01-12 DIAGNOSIS — M545 Low back pain, unspecified: Secondary | ICD-10-CM

## 2016-01-12 DIAGNOSIS — R112 Nausea with vomiting, unspecified: Secondary | ICD-10-CM | POA: Diagnosis not present

## 2016-01-12 DIAGNOSIS — Z79899 Other long term (current) drug therapy: Secondary | ICD-10-CM | POA: Insufficient documentation

## 2016-01-12 DIAGNOSIS — S3992XA Unspecified injury of lower back, initial encounter: Secondary | ICD-10-CM | POA: Diagnosis not present

## 2016-01-12 MED ORDER — HYDROCODONE-ACETAMINOPHEN 5-325 MG PO TABS: 1.0000 | ORAL_TABLET | ORAL | 0 refills | Status: DC | PRN

## 2016-01-12 MED ORDER — HYDROCODONE-ACETAMINOPHEN 5-325 MG PO TABS
1.0000 | ORAL_TABLET | Freq: Once | ORAL | Status: AC
  Administered 2016-01-12: 1 via ORAL
  Filled 2016-01-12: qty 1

## 2016-01-12 NOTE — ED Triage Notes (Signed)
Patient presents to the ED via POV.  Pt had surgery on July 17th on T2 and T3, Fell yesterday; hasn't been able to get pain under good control.

## 2016-01-12 NOTE — ED Notes (Signed)
Bed: [id number] Expected date:  Expected time:  Means of arrival:  Comments: Abdominal pain

## 2016-01-12 NOTE — Telephone Encounter (Signed)
Pt and his son need to come in for an appt.  I recommend she see an addiction specialist. She will have to agree to go.

## 2016-01-12 NOTE — Telephone Encounter (Signed)
Patient son, Konrad Dolores called and stated that he has concerns regarding his mother's medications. Son wants to talk with Dr. Mariea Clonts regarding his mother. Son stated that she is having trouble with her pain pills. Abusing them. When she runs out she goes through withdrawals and she goes to the ER and then they give her more. 7/17-Back Surgery released on 7/19 7/19-Dr. Ellene Route prescribed Hydrocodone 10 day supply #40 7/26-Dr. Elsner/Dr. Trenton Gammon prescribed #60 Tramadol with 3RF 8/1- Refill of Tramadol 50mg  #60-Dr. Poole 8/10-ER prescribed Hydrocodone  Within 7/19-8/10-- -Took 40 tablets of Hydrocodone in 7 days per son.  -Took 60 tablets of Tramadol in 7 days -Took another 60 tablets of Tramadol in 9 days.  When she runs out she aggravates the neighbors and son. Son does not know what to do. Last night she called son and she was crying in withdrawals. Son wants to know if there is anything to do for her. States she knows how to work the system. Son is at his wits end, the younger son has already written her off. The neighbor had to take her to the ER late last night.  Son, Konrad Dolores would like to speak with you regarding this. Please Call # [phone]

## 2016-01-12 NOTE — ED Provider Notes (Signed)
[facility] DEPT Provider Note   CSN: [id number] Arrival date & time: 01/12/16  [id number]  By signing my name below, I, Gwenlyn Fudge, attest that this documentation has been prepared under the direction and in the presence of Charlann Lange, PA-C. Electronically Signed: Gwenlyn Fudge, ED Scribe. 01/12/16. 1:56 AM.  First MD Initiated Contact with Patient 01/12/16 0144    History   Chief Complaint Chief Complaint  Patient presents with  . Back Pain   The history is provided by the patient. No language interpreter was used.   HPI Comments: [name] is a 75 y.o. female who presents to the Emergency Department complaining of gradual onset, constant lower back pain onset yesterday s/p fall. Pt had her 5th back surgery on her L2 and L3 on 12/19/15. Pt states she hit her shoes and tripped. Pt states she saved herself from going down, but admits to twisting. Pt states she was placed on Flexeril, but she states it is not working. Pt states pain is not exacerbated with movement. Pt states the pain is relieved with Hydrocodone. Pt denies abdominal pain or bowel / urinary incontnence   Past Medical History:  Diagnosis Date  . Anxiety   . Arthritis    djd  . Cataracts, bilateral    immature   . Cellulitis    10/11/11 hospitalized for cellulitis   . Chronic back pain    scoliosis/stenosis  . Depression    takes Zoloft daily  . Depression   . Dysrhythmia    hx tachy palpitations  . Elbow fracture, left April 2015  . GERD (gastroesophageal reflux disease)    hx pud '98  . Headache(784.0)    migraines yrs ago  . History of bronchitis    many yrs ago  . Hyperlipemia    takes Atorvastatin daily  . Hypertension    takes HCTZ daily  . Insomnia   . Joint pain   . Joint swelling   . Neuromuscular disorder ([place])    peripheral neuropathy, FEET AND LEGS  . Peripheral neuropathy ([place])   . Peripheral neuropathy (HCC)    takes Gabapentin daily  . PPD positive, treated 1987   tx'd x 1 yr  w/ inh  . Radiculopathy, lumbar region   . Restless leg syndrome    takes Sinemet daily  . Stroke [facility])    ? 6 months ago - tests okay - [facility]   . Vertigo     Patient Active Problem List   Diagnosis Date Noted  . Herniated nucleus pulposus, L2-3 right 12/19/2015  . Right foot pain 07/22/2015  . Chest pain 07/11/2015  . History of drug overdose 07/07/2015  . Frequent falls 07/07/2015  . Hyperlipidemia 07/07/2015  . Essential hypertension 07/07/2015  . Spinal stenosis, lumbar region, with neurogenic claudication 07/07/2015  . Severe episode of recurrent major depressive disorder, with psychotic features ([place]) 07/07/2015  . Visual hallucination 07/01/2015  . Delirium   . Severe episode of recurrent major depressive disorder, without psychotic features ([place])   . Back pain 06/20/2015  . MDD (major depressive disorder), recurrent episode, severe ([place]) 06/16/2015  . Lumbar stenosis 04/19/2015  . Hypokalemia 09/19/2014  . Constipation 09/19/2014  . Acute encephalopathy 09/18/2014  . Acute respiratory failure ([place]) 09/17/2014  . OSA (obstructive sleep apnea) 09/17/2014  . Ankle fracture, lateral malleolus, closed 09/14/2014  . Major depressive disorder, recurrent severe without psychotic features ([place]) 08/07/2014  . Insomnia 08/05/2014  . Hip joint painful on movement 08/05/2014  . Degenerative  disc disease, lumbar 08/05/2014  . Severe obesity (BMI >= 40) ([place]) 01/11/2014  . Depression   . Arthritis   . Polyneuropathy in other diseases classified elsewhere ([place]) 04/08/2013  . Restless legs syndrome (RLS) 04/08/2013  . UTI (lower urinary tract infection) 10/14/2011  . Dizziness 10/12/2011  . Fall 10/12/2011  . TIA (transient ischemic attack) 10/12/2011  . Cellulitis 10/12/2011  . GERD (gastroesophageal reflux disease) 10/12/2011    Past Surgical History:  Procedure Laterality Date  . ABDOMINAL HYSTERECTOMY  1975   bil oophorectomy  . BACK SURGERY   MANY YRS AGO    laminectomy x3  . CARPAL TUNNEL RELEASE  07/09/2012   Procedure: CARPAL TUNNEL RELEASE;  Surgeon: Magnus Sinning, MD;  Location: [facility];  Service: Orthopedics;  Laterality: Left;  . cervical disc surgery x2  2011  . CHOLECYSTECTOMY  1993  . colonosocpy    . ESOPHAGOGASTRODUODENOSCOPY    . FINGER ARTHROPLASTY  07/09/2012   Procedure: FINGER ARTHROPLASTY;  Surgeon: Magnus Sinning, MD;  Location: [facility];  Service: Orthopedics;  Laterality: Left;  Interposition Arthroplasty [facility] Joint Thumb Left   . HEMATOMA EVACUATION  2011   s/p rt cea  . JOINT REPLACEMENT  '01   total knee replacement, LEFT  . left knee arthroscopy  2001  . LIPOMA EXCISION  07/09/2012   Procedure: EXCISION LIPOMA;  Surgeon: Magnus Sinning, MD;  Location: [facility];  Service: Orthopedics;  Laterality: Left;  Excision Lipoma Dorsum Left Wrist   . LUMBAR LAMINECTOMY WITH COFLEX 1 LEVEL Bilateral 04/19/2015   Procedure: LUMBAR TWO-THREE LUMBAR LAMINECTOMY WITH COFLEX ;  Surgeon: Kristeen Miss, MD;  Location: [facility] [place];  Service: Neurosurgery;  Laterality: Bilateral;  Bilateral L23 laminectomy and foraminotomy with coflex  . LUMBAR LAMINECTOMY/DECOMPRESSION MICRODISCECTOMY Right 12/19/2015   Procedure: Right Lumbar two-three  Microdiskectomy;  Surgeon: Kristeen Miss, MD;  Location: [place] [facility];  Service: Neurosurgery;  Laterality: Right;  . ORIF ANKLE FRACTURE Right 09/14/2014   FIBULA   . ORIF ANKLE FRACTURE Right 09/14/2014   Procedure: OPEN REDUCTION INTERNAL FIXATION (ORIF) RIGHT ANKLE FRACTURE/SYNDESMOSIS ;  Surgeon: Wylene Simmer, MD;  Location: [facility];  Service: Orthopedics;  Laterality: Right;  . right knee replacement     09/2011   . rt carotid enarterectomy  2011  . rt knee arthroscopy  '99  . TOTAL KNEE ARTHROPLASTY  09/13/2011   Procedure: TOTAL KNEE ARTHROPLASTY;  Surgeon: Magnus Sinning, MD;  Location: [facility];  Service: Orthopedics;  Laterality: Right;    OB History    No data available       Home  Medications    Prior to Admission medications   Medication Sig Start Date End Date Taking? Authorizing Provider  aspirin EC 81 MG tablet Take 1 tablet (81 mg total) by mouth daily. 07/14/15   Florencia Reasons, MD  atorvastatin (LIPITOR) 40 MG tablet Take one tablet by mouth once daily for cholesterol 06/28/15   Encarnacion Slates, NP  cholecalciferol (VITAMIN D) 1000 units tablet Take 1 tablet (1,000 Units total) by mouth daily. For bone health 06/28/15   Encarnacion Slates, NP  gabapentin (NEURONTIN) 400 MG capsule Take 1 capsule (400 mg total) by mouth 4 (four) times daily. For pain management/agitation 09/02/15   [name], DO  HYDROcodone-acetaminophen (NORCO/VICODIN) 5-325 MG tablet Take 1 tablet by mouth every 4 (four) hours as needed (mild pain). 12/21/15   Kristeen Miss, MD  lisinopril (PRINIVIL,ZESTRIL) 5 MG tablet Take 1 tablet (  5 mg total) by mouth daily. For high blood pressure 09/02/15   [name] L [name], DO  metoprolol (LOPRESSOR) 50 MG tablet Take one tablet by mouth twice daily to control blood pressure 08/22/15   [name] L [name], DO  pyridOXINE (VITAMIN B-6) 50 MG tablet Take 1 tablet (50 mg total) by mouth daily. For Vitamin B-6 supplement 08/22/15   [name] L [name], DO  sertraline (ZOLOFT) 50 MG tablet Take 2 tablets (100 mg total) by mouth daily. 07/07/15   [name] L [name], DO  sodium chloride (OCEAN) 0.65 % SOLN nasal spray Place 1 spray into both nostrils as needed for congestion. 06/28/15   Encarnacion Slates, NP  SUMAtriptan (IMITREX) 50 MG tablet Take 1 tablet by mouth  every 2 hours as needed for migraine/headache. May  repeat in 2 hours if  headache persists or recurs 12/09/15   [name], DO    Family History Family History  Problem Relation Age of Onset  . Congestive Heart Failure Father   . Congestive Heart Failure Sister   . Alzheimer's disease Mother   . Diabetes Brother   . Arthritis Brother     Social History Social History  Substance Use Topics  . Smoking status: Never Smoker  .  Smokeless tobacco: Never Used  . Alcohol use No     Allergies   Contrast media [iodinated diagnostic agents]; Acyclovir and related; and Sulfa drugs cross reactors   Review of Systems Review of Systems  Constitutional: Negative for fever.  Musculoskeletal: Positive for back pain.  All other systems reviewed and are negative.    Physical Exam Updated Vital Signs BP 184/91 (BP Location: Left Arm)   Pulse 82   Temp 98.3 F (36.8 C) (Oral)   Resp 15   Ht 5\' 5"  (1.651 m)   Wt 181 lb (82.1 kg)   SpO2 98%   BMI 30.12 kg/m   Physical Exam  Constitutional: She appears well-developed and well-nourished.  HENT:  Head: Normocephalic.  Eyes: Conjunctivae are normal.  Cardiovascular: Normal rate.   Pulmonary/Chest: Effort normal. No respiratory distress.  Abdominal: She exhibits no distension. There is no tenderness.  Musculoskeletal: Normal range of motion.       Arms: Tender to right paralumbar area without palpable spasm or swelling. FROM LE's. No midline lumbar tenderness.   Neurological: [name] alert.  Skin: Skin is warm and dry.  Psychiatric: She has a normal mood and affect. Her behavior is normal.  Nursing note and vitals reviewed.    ED Treatments / Results  DIAGNOSTIC STUDIES: Oxygen Saturation is 98% on RA, normal by my interpretation.    COORDINATION OF CARE: 1:42 AM Discussed treatment plan with pt at bedside which includes  and pt agreed to plan.  Labs (all labs ordered are listed, but only abnormal results are displayed) Labs Reviewed - No data to display  EKG  EKG Interpretation None       Radiology No results found. Dg Lumbar Spine Complete  Result Date: 01/12/2016 CLINICAL DATA:  Acute onset of lower back pain status post fall. Initial encounter. EXAM: LUMBAR SPINE - COMPLETE 4+ VIEW COMPARISON:  MRI of the lumbar spine performed 10/27/2015, and lumbar spine radiograph performed 12/19/2015 FINDINGS: There is no evidence of fracture or  subluxation. Hardware is noted at the posterior elements at L2-L3, with endplate degenerative change at L2-L3. Endplate sclerosis is also noted at L5-S1. Underlying facet disease is noted along the lower lumbar spine. Vertebral bodies demonstrate normal height  and alignment. The visualized bowel gas pattern is unremarkable in appearance; air and stool are noted within the colon. The sacroiliac joints are within normal limits. Clips are noted within the right upper quadrant, reflecting prior cholecystectomy. IMPRESSION: No evidence of fracture or subluxation along the lumbar spine. Postoperative and mild degenerative change along the lumbar spine. Electronically Signed   By: Garald Balding M.D.   On: 01/12/2016 02:32   Dg Lumbar Spine 1 View  Result Date: 12/19/2015 CLINICAL DATA:  Surgical localization. EXAM: LUMBAR SPINE - 1 VIEW COMPARISON:  MRI of Oct 27, 2015. FINDINGS: Single intraoperative cross-table lateral projection of the lumbar spine demonstrates surgical probe directed toward the posterior margin of L2-3 disc space. IMPRESSION: Surgical localization of L2-3 as described above. Electronically Signed   By: Marijo Conception, M.D.   On: 12/19/2015 15:27    Procedures Procedures (including critical care time)  Medications Ordered in ED Medications - No data to display   Initial Impression / Assessment and Plan / ED Course  I have reviewed the triage vital signs and the nursing notes.  Pertinent labs & imaging results that were available during my care of the patient were reviewed by me and considered in my medical decision making (see chart for details).  Clinical Course    Patient with right low back pain since fall yesterday. No relief with muscle relaxers alone. No abdominal pain, urinary symptoms, neurologic deficits. She reports moderate relief with Norco.   [name] stable for discharge home. Return precautions provided.   Final Clinical Impressions(s) / ED Diagnoses   Final  diagnoses:  None    New Prescriptions New Prescriptions   No medications on file   I personally performed the services described in this documentation, which was scribed in my presence. The recorded information has been reviewed and is accurate.      Charlann Lange, PA-C 01/12/16 [id number]    Gareth Morgan, MD 01/12/16 1843

## 2016-01-13 DIAGNOSIS — R112 Nausea with vomiting, unspecified: Secondary | ICD-10-CM | POA: Diagnosis not present

## 2016-01-13 DIAGNOSIS — K2211 Ulcer of esophagus with bleeding: Secondary | ICD-10-CM | POA: Diagnosis not present

## 2016-01-13 DIAGNOSIS — F1123 Opioid dependence with withdrawal: Secondary | ICD-10-CM | POA: Diagnosis not present

## 2016-01-13 DIAGNOSIS — D72829 Elevated white blood cell count, unspecified: Secondary | ICD-10-CM | POA: Diagnosis not present

## 2016-01-13 LAB — HEPATIC FUNCTION PANEL
ALT: 11 U/L (ref 7–35)
AST: 53 U/L — AB (ref 13–35)
Alkaline Phosphatase: 83 U/L (ref 25–125)
BILIRUBIN, TOTAL: 0.3 mg/dL

## 2016-01-13 NOTE — Telephone Encounter (Signed)
Spoke with son and he is taking pt to [facility] for Detox for admission, I expressed out concerns and let the son know if we can do anything to help just let us know.

## 2016-01-17 ENCOUNTER — Telehealth: Payer: Self-pay | Admitting: *Deleted

## 2016-01-17 DIAGNOSIS — K264 Chronic or unspecified duodenal ulcer with hemorrhage: Secondary | ICD-10-CM | POA: Diagnosis not present

## 2016-01-17 DIAGNOSIS — K922 Gastrointestinal hemorrhage, unspecified: Secondary | ICD-10-CM | POA: Diagnosis not present

## 2016-01-17 DIAGNOSIS — F1123 Opioid dependence with withdrawal: Secondary | ICD-10-CM | POA: Diagnosis not present

## 2016-01-17 DIAGNOSIS — I959 Hypotension, unspecified: Secondary | ICD-10-CM | POA: Diagnosis not present

## 2016-01-17 DIAGNOSIS — K269 Duodenal ulcer, unspecified as acute or chronic, without hemorrhage or perforation: Secondary | ICD-10-CM | POA: Diagnosis not present

## 2016-01-17 DIAGNOSIS — R05 Cough: Secondary | ICD-10-CM | POA: Diagnosis not present

## 2016-01-17 DIAGNOSIS — Z9049 Acquired absence of other specified parts of digestive tract: Secondary | ICD-10-CM | POA: Diagnosis not present

## 2016-01-17 DIAGNOSIS — Z79891 Long term (current) use of opiate analgesic: Secondary | ICD-10-CM | POA: Diagnosis not present

## 2016-01-17 DIAGNOSIS — G894 Chronic pain syndrome: Secondary | ICD-10-CM | POA: Diagnosis not present

## 2016-01-17 DIAGNOSIS — E7800 Pure hypercholesterolemia, unspecified: Secondary | ICD-10-CM | POA: Diagnosis not present

## 2016-01-17 DIAGNOSIS — R197 Diarrhea, unspecified: Secondary | ICD-10-CM | POA: Diagnosis not present

## 2016-01-17 DIAGNOSIS — I10 Essential (primary) hypertension: Secondary | ICD-10-CM | POA: Diagnosis not present

## 2016-01-17 DIAGNOSIS — D62 Acute posthemorrhagic anemia: Secondary | ICD-10-CM | POA: Diagnosis not present

## 2016-01-17 DIAGNOSIS — M1990 Unspecified osteoarthritis, unspecified site: Secondary | ICD-10-CM | POA: Diagnosis not present

## 2016-01-17 DIAGNOSIS — R1310 Dysphagia, unspecified: Secondary | ICD-10-CM | POA: Diagnosis not present

## 2016-01-17 DIAGNOSIS — K921 Melena: Secondary | ICD-10-CM | POA: Diagnosis not present

## 2016-01-17 DIAGNOSIS — D72829 Elevated white blood cell count, unspecified: Secondary | ICD-10-CM | POA: Diagnosis not present

## 2016-01-17 DIAGNOSIS — K3189 Other diseases of stomach and duodenum: Secondary | ICD-10-CM | POA: Diagnosis not present

## 2016-01-17 DIAGNOSIS — K2211 Ulcer of esophagus with bleeding: Secondary | ICD-10-CM | POA: Diagnosis not present

## 2016-01-17 DIAGNOSIS — M25559 Pain in unspecified hip: Secondary | ICD-10-CM | POA: Diagnosis not present

## 2016-01-17 DIAGNOSIS — M549 Dorsalgia, unspecified: Secondary | ICD-10-CM | POA: Diagnosis not present

## 2016-01-17 NOTE — Telephone Encounter (Signed)
Patient son, [name] called and stated that he wanted to give [name] an update on his mother's encounter with [facility]. Had her put in Detox. Patient called him today and was going to be released but she went to the bathroom and had really dark stool. Her hemoglobin at check in was 11.6 and today she was weak and could hardly sit on toilet and her stool was very dark. Hemoglobin was checked and it was 6 something. Was admitted to hospital. Dr. Sheppard Evens was her Detox Dr. In the hospital.   Son stated that he thinks she is going to need more rehab than what she has received. Patient does have an appointment on 8/21 with you and son will come at that time also and discuss this farther.

## 2016-01-18 DIAGNOSIS — M25559 Pain in unspecified hip: Secondary | ICD-10-CM | POA: Diagnosis not present

## 2016-01-18 DIAGNOSIS — D62 Acute posthemorrhagic anemia: Secondary | ICD-10-CM | POA: Diagnosis not present

## 2016-01-18 DIAGNOSIS — I10 Essential (primary) hypertension: Secondary | ICD-10-CM | POA: Diagnosis not present

## 2016-01-18 DIAGNOSIS — M549 Dorsalgia, unspecified: Secondary | ICD-10-CM | POA: Diagnosis not present

## 2016-01-18 DIAGNOSIS — E7800 Pure hypercholesterolemia, unspecified: Secondary | ICD-10-CM | POA: Diagnosis not present

## 2016-01-18 DIAGNOSIS — D649 Anemia, unspecified: Secondary | ICD-10-CM | POA: Diagnosis not present

## 2016-01-18 DIAGNOSIS — K2211 Ulcer of esophagus with bleeding: Secondary | ICD-10-CM | POA: Diagnosis not present

## 2016-01-18 DIAGNOSIS — R1310 Dysphagia, unspecified: Secondary | ICD-10-CM | POA: Diagnosis not present

## 2016-01-18 DIAGNOSIS — K922 Gastrointestinal hemorrhage, unspecified: Secondary | ICD-10-CM | POA: Diagnosis not present

## 2016-01-18 DIAGNOSIS — K264 Chronic or unspecified duodenal ulcer with hemorrhage: Secondary | ICD-10-CM | POA: Diagnosis not present

## 2016-01-18 DIAGNOSIS — I959 Hypotension, unspecified: Secondary | ICD-10-CM | POA: Diagnosis not present

## 2016-01-18 DIAGNOSIS — F1123 Opioid dependence with withdrawal: Secondary | ICD-10-CM | POA: Diagnosis not present

## 2016-01-18 DIAGNOSIS — R197 Diarrhea, unspecified: Secondary | ICD-10-CM | POA: Diagnosis not present

## 2016-01-18 DIAGNOSIS — K921 Melena: Secondary | ICD-10-CM | POA: Diagnosis not present

## 2016-01-18 DIAGNOSIS — G894 Chronic pain syndrome: Secondary | ICD-10-CM | POA: Diagnosis not present

## 2016-01-18 DIAGNOSIS — D72829 Elevated white blood cell count, unspecified: Secondary | ICD-10-CM | POA: Diagnosis not present

## 2016-01-18 DIAGNOSIS — Z79891 Long term (current) use of opiate analgesic: Secondary | ICD-10-CM | POA: Diagnosis not present

## 2016-01-18 DIAGNOSIS — Z9049 Acquired absence of other specified parts of digestive tract: Secondary | ICD-10-CM | POA: Diagnosis not present

## 2016-01-18 DIAGNOSIS — M1990 Unspecified osteoarthritis, unspecified site: Secondary | ICD-10-CM | POA: Diagnosis not present

## 2016-01-18 DIAGNOSIS — K2210 Ulcer of esophagus without bleeding: Secondary | ICD-10-CM | POA: Diagnosis not present

## 2016-01-18 NOTE — Telephone Encounter (Signed)
I agree that she will need longstanding therapy.  Looking forward to seeing them 8/21.

## 2016-01-19 LAB — BASIC METABOLIC PANEL
BUN: 42 mg/dL — AB (ref 4–21)
CREATININE: 1.1 mg/dL (ref 0.5–1.1)
Glucose: 107 mg/dL
POTASSIUM: 4.2 mmol/L (ref 3.4–5.3)
Sodium: 138 mmol/L (ref 137–147)

## 2016-01-20 ENCOUNTER — Encounter: Payer: Self-pay | Admitting: *Deleted

## 2016-01-20 LAB — CBC AND DIFFERENTIAL
HEMATOCRIT: 23 % — AB (ref 36–46)
HEMOGLOBIN: 7.6 g/dL — AB (ref 12.0–16.0)
PLATELETS: 262 10*3/uL (ref 150–399)
WBC: 8.8 10^3/mL

## 2016-01-23 ENCOUNTER — Ambulatory Visit (INDEPENDENT_AMBULATORY_CARE_PROVIDER_SITE_OTHER): Payer: Medicare Other | Admitting: Internal Medicine

## 2016-01-23 ENCOUNTER — Ambulatory Visit: Payer: Medicare Other | Admitting: Internal Medicine

## 2016-01-23 ENCOUNTER — Telehealth: Payer: Self-pay | Admitting: *Deleted

## 2016-01-23 ENCOUNTER — Encounter: Payer: Self-pay | Admitting: Internal Medicine

## 2016-01-23 ENCOUNTER — Other Ambulatory Visit: Payer: Self-pay | Admitting: Internal Medicine

## 2016-01-23 VITALS — BP 160/88 | HR 129 | Temp 98.0°F | Wt 178.0 lb

## 2016-01-23 DIAGNOSIS — K2211 Ulcer of esophagus with bleeding: Secondary | ICD-10-CM

## 2016-01-23 DIAGNOSIS — Z23 Encounter for immunization: Secondary | ICD-10-CM

## 2016-01-23 DIAGNOSIS — K264 Chronic or unspecified duodenal ulcer with hemorrhage: Secondary | ICD-10-CM

## 2016-01-23 DIAGNOSIS — M79671 Pain in right foot: Secondary | ICD-10-CM | POA: Diagnosis not present

## 2016-01-23 DIAGNOSIS — F1120 Opioid dependence, uncomplicated: Secondary | ICD-10-CM | POA: Diagnosis not present

## 2016-01-23 DIAGNOSIS — D62 Acute posthemorrhagic anemia: Secondary | ICD-10-CM | POA: Diagnosis not present

## 2016-01-23 DIAGNOSIS — G8929 Other chronic pain: Secondary | ICD-10-CM | POA: Diagnosis not present

## 2016-01-23 DIAGNOSIS — K922 Gastrointestinal hemorrhage, unspecified: Secondary | ICD-10-CM | POA: Diagnosis not present

## 2016-01-23 DIAGNOSIS — F1123 Opioid dependence with withdrawal: Secondary | ICD-10-CM | POA: Diagnosis not present

## 2016-01-23 DIAGNOSIS — I10 Essential (primary) hypertension: Secondary | ICD-10-CM | POA: Diagnosis not present

## 2016-01-23 DIAGNOSIS — R000 Tachycardia, unspecified: Secondary | ICD-10-CM | POA: Diagnosis not present

## 2016-01-23 DIAGNOSIS — F323 Major depressive disorder, single episode, severe with psychotic features: Secondary | ICD-10-CM | POA: Diagnosis not present

## 2016-01-23 DIAGNOSIS — K269 Duodenal ulcer, unspecified as acute or chronic, without hemorrhage or perforation: Secondary | ICD-10-CM | POA: Diagnosis not present

## 2016-01-23 NOTE — Telephone Encounter (Signed)
Patient son, Francene Boyers called and stated that patient is trying her best to get out of the appointment today and she needs to come. Son stated that there is a lot going on and they need to speak with you. Patient has already called here this morning and canceled her appointment stating she had diarrhea. Son stated that she does not have diarrhea that she just doesn't want to come due to the detoxing. I called patient and told her that Dr. Mariea Clonts wants to see her today and that she needed to keep her appointment. She agreed and stated that she will try to come.

## 2016-01-23 NOTE — Progress Notes (Signed)
Location:  [facility] clinic Provider:  Adelbert Gaspard L. Mariea Clonts, D.O., C.M.D.  Code Status: DNR Goals of Care:  Advanced Directives 01/12/2016  Does patient have an advance directive? Yes  Type of Advance Directive Living will  Does patient want to make changes to advanced directive? No - Patient declined  Copy of advanced directive(s) in chart? No - copy requested  Would patient like information on creating an advanced directive? -  Pre-existing out of facility DNR order (yellow form or pink MOST form) -  Some encounter information is confidential and restricted. Go to Review Flowsheets activity to see all data.     Chief Complaint  Patient presents with  . Medical Management of Chronic Issues    follow-up    HPI: Patient is a 75 y.o. female seen today for medical management of chronic diseases.    She reports she is doing better.  [name] not seeing black in the stool any longer.  Is still having some loose bms since last night.  Is taking the iron and the protonix.    Her son reports he can tell when she's on a high and when she's off a high.  She has ordered twice since discharge.  Back surgery #1 nov and went to rehab.  Discharged home when she wouldn't do therapy.  Then [facility].    Latest back surgery was 7/17--refused rehab, PT, OT.  Said she could do it herself.  7/19 d/c'd home.  Sent with 40 pills of hydrocodone from Dr. Ellene Route.  7/26 out of hydrocodone.  Got 60 tramadol from Dr. Trenton Gammon on call.  8/1, she got another tramadol refilled.  8/8, out of tramadol and going through withdrawals.  8/10, she had 12 hydrocodone prescribed by ED for her back pain.  8/10, son took her to postop appt with Dr. Ellene Route.  He said no more.  She agreed she had a problem, but wanted to detox herself and he said she couldn't.  Son took her home and tried to figure out where to take her to detox.  8/11, pt called son about stomach pain and withdrawals.  8/11, she ordered 60  tramadol.  Her son took her to the [facility] ED for detox--admitted only for 3 days.  They could not keep her and she was adamant about leaving.  8/15, GI bleeding was noted and she had endoscopy.  8/18, released from hospital.  8/19, called her son, I'm in so much pain.  Then wanted afrin nasal spray--her daughter in law gave her saline nasal spray.  She ordered another Rx of tramadol 60 pills.  Son found it on the [employer] rx website.  She said she might need it.  It was not picked up.  She denied addiction.   Mystery where tramadol went 8/11 b/c [facility].   Tens unit helps her foot.  Her son is managing her finances and she is broke.   Says she gets to thinking about her husband and wants to wipe it away.   Has been in [place] this year and been at [facility], but hasn't been to any outpatient.   Had an extensive talk with her son, Konrad Dolores, who was here with her.  He does not think she will attend outpatient addiction medicine/counseling and needs inpatient in order to be adherent.  She says she would go to outpatient, but her track record for coming to appts is very poor (here  and with her psychologist).     Past Medical History:  Diagnosis Date  . Anxiety   . Arthritis    djd  . Cataracts, bilateral    immature   . Cellulitis    10/11/11 hospitalized for cellulitis   . Chronic back pain    scoliosis/stenosis  . Depression    takes Zoloft daily  . Depression   . Dysrhythmia    hx tachy palpitations  . Elbow fracture, left April 2015  . GERD (gastroesophageal reflux disease)    hx pud '98  . Headache(784.0)    migraines yrs ago  . History of bronchitis    many yrs ago  . Hyperlipemia    takes Atorvastatin daily  . Hypertension    takes HCTZ daily  . Insomnia   . Joint pain   . Joint swelling   . Neuromuscular disorder ([place])    peripheral neuropathy, FEET AND LEGS  . Peripheral neuropathy ([place])   . Peripheral neuropathy (HCC)     takes Gabapentin daily  . 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 Radiculopathy, lumbar region   . Restless leg syndrome    takes Sinemet daily  . Stroke [facility])    ? 6 months ago - tests okay - [facility]   . Vertigo     Past Surgical History:  Procedure Laterality Date  . ABDOMINAL HYSTERECTOMY  1975   bil oophorectomy  . BACK SURGERY   MANY YRS AGO   laminectomy x3  . CARPAL TUNNEL RELEASE  07/09/2012   Procedure: CARPAL TUNNEL RELEASE;  Surgeon: Magnus Sinning, MD;  Location: [facility];  Service: Orthopedics;  Laterality: Left;  . cervical disc surgery x2  2011  . CHOLECYSTECTOMY  1993  . colonosocpy    . ESOPHAGOGASTRODUODENOSCOPY    . FINGER ARTHROPLASTY  07/09/2012   Procedure: FINGER ARTHROPLASTY;  Surgeon: Magnus Sinning, MD;  Location: [facility];  Service: Orthopedics;  Laterality: Left;  Interposition Arthroplasty [facility]MC Joint Thumb Left   . HEMATOMA EVACUATION  2011   s/p rt cea  . JOINT REPLACEMENT  '01   total knee replacement, LEFT  . left knee arthroscopy  2001  . LIPOMA EXCISION  07/09/2012   Procedure: EXCISION LIPOMA;  Surgeon: Magnus Sinning, MD;  Location: [facility];  Service: Orthopedics;  Laterality: Left;  Excision Lipoma Dorsum Left Wrist   . LUMBAR LAMINECTOMY WITH COFLEX 1 LEVEL Bilateral 04/19/2015   Procedure: LUMBAR TWO-THREE LUMBAR LAMINECTOMY WITH COFLEX ;  Surgeon: Kristeen Miss, MD;  Location: [place] [facility];  Service: Neurosurgery;  Laterality: Bilateral;  Bilateral L23 laminectomy and foraminotomy with coflex  . LUMBAR LAMINECTOMY/DECOMPRESSION MICRODISCECTOMY Right 12/19/2015   Procedure: Right Lumbar two-three  Microdiskectomy;  Surgeon: Kristeen Miss, MD;  Location: [place] NEURO ORS;  Service: Neurosurgery;  Laterality: Right;  . ORIF ANKLE FRACTURE Right 09/14/2014   FIBULA   . ORIF ANKLE FRACTURE Right 09/14/2014   Procedure: OPEN REDUCTION INTERNAL FIXATION (ORIF) RIGHT ANKLE FRACTURE/SYNDESMOSIS ;  Surgeon: Wylene Simmer, MD;  Location: [place];  Service:  Orthopedics;  Laterality: Right;  . right knee replacement     09/2011   . rt carotid enarterectomy  2011  . rt knee arthroscopy  '99  . TOTAL KNEE ARTHROPLASTY  09/13/2011   Procedure: TOTAL KNEE ARTHROPLASTY;  Surgeon: Magnus Sinning, MD;  Location: [facility];  Service: Orthopedics;  Laterality: Right;    Allergies  Allergen Reactions  . Contrast Media [  Iodinated Diagnostic Agents] Other (See Comments)    Respiratory failure  . Acyclovir And Related Other (See Comments)    unknown  . Sulfa Drugs Cross Reactors Nausea And Vomiting      Medication List       Accurate as of 01/23/16 11:22 AM. Always use your most recent med list.          atorvastatin 40 MG tablet Commonly known as:  LIPITOR Take one tablet by mouth once daily for cholesterol   cholecalciferol 1000 units tablet Commonly known as:  VITAMIN D Take 1 tablet (1,000 Units total) by mouth daily. For bone health   cyclobenzaprine 10 MG tablet Commonly known as:  FLEXERIL Take 1 tablet by mouth 3 (three) times daily.   docusate sodium 100 MG capsule Commonly known as:  COLACE Take 100 mg by mouth daily as needed for mild constipation.   ferrous sulfate 325 (65 FE) MG EC tablet Take 325 mg by mouth 2 (two) times daily.   gabapentin 400 MG capsule Commonly known as:  NEURONTIN Take 1 capsule (400 mg total) by mouth 4 (four) times daily. For pain management/agitation   lisinopril 5 MG tablet Commonly known as:  PRINIVIL,ZESTRIL Take 1 tablet (5 mg total) by mouth daily. For high blood pressure   metoprolol 50 MG tablet Commonly known as:  LOPRESSOR Take one tablet by mouth twice daily to control blood pressure   naproxen sodium 220 MG tablet Commonly known as:  ANAPROX Take 220 mg by mouth 2 (two) times daily with a meal.   pantoprazole 40 MG tablet Commonly known as:  PROTONIX Take 40 mg by mouth daily.   pyridOXINE 50 MG tablet Commonly known as:  VITAMIN B-6 Take 1 tablet (50 mg total) by mouth  daily. For Vitamin B-6 supplement   sertraline 50 MG tablet Commonly known as:  ZOLOFT Take 2 tablets (100 mg total) by mouth daily.   sodium chloride 0.65 % Soln nasal spray Commonly known as:  OCEAN Place 1 spray into both nostrils as needed for congestion.      Review of Systems:  Review of Systems  Constitutional: Positive for weight loss. Negative for chills, fever and malaise/fatigue.  HENT: Negative for hearing loss.   Eyes: Negative for blurred vision.       Glasses  Respiratory: Negative for cough and shortness of breath.   Cardiovascular: Positive for palpitations and leg swelling. Negative for chest pain.       Nonpitting edema  Gastrointestinal: Positive for abdominal pain, diarrhea and heartburn. Negative for blood in stool, constipation, melena, nausea and vomiting.       Belching  Genitourinary: Negative for dysuria, frequency and urgency.  Musculoskeletal: Positive for back pain. Negative for falls.       Back pain at site of incision to right and right ankle lateral aspect  Skin: Negative for itching and rash.  Neurological: Negative for weakness.    Health Maintenance  Topic Date Due  . TETANUS/TDAP  01/26/1961  . COLONOSCOPY  01/27/1992  . ZOSTAVAX  01/26/2002  . MAMMOGRAM  12/31/2009  . INFLUENZA VACCINE  01/03/2016  . DEXA SCAN  Completed  . PNA vac Low Risk Adult  Completed    Physical Exam: Vitals:   01/23/16 1043  BP: (!) 160/88  Pulse: (!) 129  Temp: 98 F (36.7 C)  TempSrc: Oral  SpO2: 96%  Weight: 178 lb (80.7 kg)   Body mass index is 29.62 kg/m. Physical Exam  Constitutional:  [name] oriented to person, place, and time. She appears well-developed and well-nourished. No distress.  Cardiovascular:  tachycardic  Pulmonary/Chest: Effort normal and breath sounds normal. No respiratory distress.  Abdominal: Soft. Bowel sounds are normal. She exhibits no distension and no mass. There is tenderness. There is no rebound and no guarding. No  hernia.  periumbilical  Musculoskeletal: Normal range of motion.  Walking with walker; mild tenderness to left of her lumbar spine incision, but no erythema, warmth or drainage or palpable mass  Neurological: [name] alert and oriented to person, place, and time. No cranial nerve deficit.  Skin: Capillary refill takes less than 2 seconds. There is pallor.  Psychiatric:  Tearful    Labs reviewed: Basic Metabolic Panel:  Recent Labs  04/20/15 1325  06/18/15 0726  07/14/15 0635 07/27/15 2047 12/19/15 1045 01/19/16  NA 137  < > 137  < > 138 139 139 138  K 3.7  < > 3.7  < > 4.3 3.4* 3.3* 4.2  CL 99*  < > 95*  < > 102 106 107  --   CO2 29  < > 28  < > 24 20* 22  --   GLUCOSE 159*  < > 107*  < > 127* 129* 116*  --   BUN 15  < > 28*  < > 12 15 13  42*  CREATININE 1.08*  < > 1.29*  < > 0.77 0.97 0.81 1.1  CALCIUM 9.2  < > 9.6  < > 9.5 9.7 9.8  --   MG  --   --  2.0  --   --   --   --   --   TSH 0.890  --   --   --   --   --   --   --   < > = values in this interval not displayed. Liver Function Tests:  Recent Labs  07/02/15 0544 07/11/15 0511 07/27/15 2047 01/13/16  AST 21 24 31  53*  ALT 10* 10* 14 11  ALKPHOS 99 92 74 83  BILITOT 0.3 1.0 0.7  --   PROT 5.4* 6.8 7.1  --   ALBUMIN 2.6* 3.6 4.0  --     Recent Labs  07/11/15 0511 07/27/15 2047  LIPASE 23 22   No results for input(s): AMMONIA in the last 8760 hours. CBC:  Recent Labs  07/01/15 2111  07/07/15 1044 07/11/15 0511  07/14/15 0635 07/27/15 2116 12/19/15 1045 01/20/16  WBC 10.4  < > 8.3 11.8*  < > 10.0 4.4 9.2 8.8  NEUTROABS 7.0  --  4.2 6.9  --   --   --   --   --   HGB 12.3  < >  --  13.5  < > 13.2 13.6 13.9 7.6*  HCT 36.6  < > 35.5 41.7  < > 40.5 41.8 43.9 23*  MCV 95.1  < > 96 94.3  < > 95.1 97.9 95.4  --   PLT 371  < > 216 355  < > 311 PLATELET CLUMPS NOTED ON SMEAR, COUNT APPEARS DECREASED 363 262  < > = values in this interval not displayed. Lipid Panel:  Recent Labs  06/18/15 0726  07/02/15 0544  CHOL 218* 117  HDL 40* 37*  LDLCALC 137* 50  TRIG 206* 152*  CHOLHDL 5.5 3.2   Lab Results  Component Value Date   HGBA1C 6.2 (H) 07/02/2015    Procedures since last visit: Dg Lumbar Spine Complete  Result Date: 01/12/2016 CLINICAL DATA:  Acute onset of lower back pain status post fall. Initial encounter. EXAM: LUMBAR SPINE - COMPLETE 4+ VIEW COMPARISON:  MRI of the lumbar spine performed 10/27/2015, and lumbar spine radiograph performed 12/19/2015 FINDINGS: There is no evidence of fracture or subluxation. Hardware is noted at the posterior elements at L2-L3, with endplate degenerative change at L2-L3. Endplate sclerosis is also noted at L5-S1. Underlying facet disease is noted along the lower lumbar spine. Vertebral bodies demonstrate normal height and alignment. The visualized bowel gas pattern is unremarkable in appearance; air and stool are noted within the colon. The sacroiliac joints are within normal limits. Clips are noted within the right upper quadrant, reflecting prior cholecystectomy. IMPRESSION: No evidence of fracture or subluxation along the lumbar spine. Postoperative and mild degenerative change along the lumbar spine. Electronically Signed   By: Garald Balding M.D.   On: 01/12/2016 02:32    Assessment/Plan 1. Uncomplicated opioid dependence ([place]) - ongoing -was just discharged from short term detox at [facility]  -has already requested two orders of tramadol from the pharmacy for 60 tablets, one was picked up and it's unclear if she took them all and the other 56 are still at the pharmacy and her son is not going to pick them up -we discussed need for further addiction medicine therapy and they both agree -her son is checking with the list received at d/c and [facility] to see if she can be admitted for further therapy or at least attend outpatient (transportation for that will be an issue and her adherence)--needs it to be covered by medicare b/c  she is out of money -NO NARCOTIC/OPIOID MEDS TO BE PRESCRIBED TO HER  -[facility] requesting that they do not fill any opioids/controlled substances for her  2. Right foot pain -pt notes this responds to a TENS unit and gabapentin  -cannot take more gabapentin with her renal function and age   59. Uncontrolled hypertension -bp elevated--suspect due to withdrawal (I suspect she was taking those tramadol that were filled after d/c from her detox and ran out and is now withdrawing as she is also tachycardic)  4. Tachycardia with 121 - 140 beats per minute -suspect due to withdrawal  5. Duodenal ulcer with hemorrhage -still has abdominal pain and diarrhea (some may again be withdrawal) -cont protonix bid for 2 mos, then daily -f/u with GI as scheduled  6. Esophageal ulcer with bleeding -was actively bleeding during EGD, see #5 -reports not further melena, hematochezia with loose stools this am and bowel sounds normal this am  7.  Need for flu shot -given today  Labs/tests ordered: No orders of the defined types were placed in this encounter.  Next appt:  1 month   Damyn Weitzel L. Otha Rickles, D.O. [facility] [address], [place] [address] Cell Phone (Mon-Fri 8am-5pm):  [phone] On Call:  [phone] & follow prompts after 5pm & weekends Office Phone:  [phone] Office Fax:  [phone]

## 2016-01-26 ENCOUNTER — Telehealth: Payer: Self-pay

## 2016-01-26 NOTE — Telephone Encounter (Signed)
Claiborne Billings with [facility] called to inform Dr.[name] that patient cancelled 2 appointments with them this week. [facility] will see patient next week as scheduled.  FYI only

## 2016-01-26 NOTE — Telephone Encounter (Signed)
Noted.  This is in keeping with her normal behavior.  She had said she would keep her appts when I saw her.

## 2016-01-27 ENCOUNTER — Telehealth: Payer: Self-pay | Admitting: Neurology

## 2016-01-27 NOTE — Telephone Encounter (Signed)
The patient is having significant pain in the lateral aspect of her right foot. The pain is gotten worse since back surgery in July. We will see the patient in the office sometime next week, we may consider other modalities such as a TENS unit or a topical pain ointment for the foot. Hydrocodone therapy has been discontinued, the patient was getting medication from multiple prescribers.

## 2016-01-27 NOTE — Telephone Encounter (Signed)
Work-in appt scheduled for next Wed at noon. Pt notified via TC. Requested arrival 15 min prior to appt time.

## 2016-01-27 NOTE — Telephone Encounter (Signed)
Patient called answering service regarding increasing pain in feet. Advised via answering service to call office back at 8am. -VRP

## 2016-01-27 NOTE — Telephone Encounter (Signed)
Pt called request medication for neuropathy. She said the pain is in the rt lateral foot increasing yesterday. Please call

## 2016-01-27 NOTE — Telephone Encounter (Signed)
Spoke to pt. Reports pain to R side, down her leg and in her R foot. Discomfort is not relieved w/ naproxen or gabapentin 400 mg q6h. Says that she's had to take her meds w/ sips of ginger ale as eating causes her to feel nauseated. [name] s/p lumbar laminectomy (surg 12/19/15) but states that incision site feels fine. Hydrocodone rx was previously discontinued by our office d/t pt obtaining narcotics from multiple providers. Asking for further advice for pain management.

## 2016-01-31 ENCOUNTER — Other Ambulatory Visit: Payer: Self-pay | Admitting: Neurology

## 2016-01-31 ENCOUNTER — Telehealth: Payer: Self-pay | Admitting: *Deleted

## 2016-01-31 DIAGNOSIS — F1120 Opioid dependence, uncomplicated: Secondary | ICD-10-CM

## 2016-01-31 NOTE — Telephone Encounter (Signed)
I appreciate the update.  Unfortunately, she is probably going to continue this behavior unless she completes the proper therapy for it.  Let's put in a social work referral to see if they can help with the situation as an outpatient--perhaps they could arrange for her to get transportation to an outpatient addiction medicine program?  Please put [name]'s contact information in the referral not the patient's.

## 2016-01-31 NOTE — Telephone Encounter (Signed)
Patient called and wants something for pain. Stated that her Low Back hurts and hip hurts bad. Patient also wants a refill on her Sumatriptine (for Migraines) which I don't see in current medication list. Please Advise.

## 2016-01-31 NOTE — Telephone Encounter (Signed)
I received a note indicating that she refused her home health therapy that she had agreed to take at her appointment.  They could have helped her with her pain.  I recommend she apply heat for 20 minute intervals on her back up to 6 times per day.    Her migraine medication was only to be taken when she actually has migraines, not to sedate her as I suspect she was using it.      I am waiting to hear back from her son about possible inpatient detox for her.  She has also called Dr. Eulas Post on call requesting medication for pain after she said she had a fall at home.  She does not take these medications safely and they cannot be prescribed at this time.  Her pharmacies were notified not to fill these either and Dr. Ellene Route and Dr. Jannifer Franklin were also made aware of this situation.

## 2016-01-31 NOTE — Telephone Encounter (Signed)
Son, Eliezer Mccoy called and stated that he is hitting a dead end for the inpatient therapy of Opoid Abuse. In home therapy is suppose to be there today at 4:00pm. He stated that she has been through the detox before her appointment. He called the list that was given to him and has had no luck, no beds available, does not accept her insurance or she has to pay a lot to get in.   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,000  [facility] 9 month residential [facility] does not accept insurance [place] only residents with children [employer] 9-12 month stay [facility] has to be a [place] does not take insurance, $3220, no beds [facility] does not cover Technical brewer, [place] [phone] [facility] does not take Medicare [facility], [facility] way house, $3220, no beds [facility] 18-24 month stay [employer] $15000 self pay for [facility] no answer [facility] is mental health only [place] does not accept private insurance  Son stated that patient started calling him this morning at 7am about her pain. Son stated that she is on Flexeril right now, got a Rx on Thursday and then got another refill on it yesterday, she told him she knocked them over and spilled them.

## 2016-01-31 NOTE — Telephone Encounter (Signed)
Called and informed patient of Dr. Cyndi Lennert instructions of using the heat on back. Patient wants to know if we are going to call her Migraine medication in.   Called and spoke with [name]'s wife regarding the social worker referral and she agreed. She stated that she agrees with Dr. Mariea Clonts about patient using the migraine medication for sedation effect. She suggest if your going to call the medication in, if we could call it in to a local pharmacy and not a mail order and only give her 1 at a time.

## 2016-02-01 ENCOUNTER — Emergency Department (HOSPITAL_COMMUNITY)
Admission: EM | Admit: 2016-02-01 | Discharge: 2016-02-01 | Disposition: A | Discharge status: Home / Self Care | Payer: Medicare Other | Attending: Emergency Medicine | Admitting: Emergency Medicine

## 2016-02-01 ENCOUNTER — Telehealth: Payer: Self-pay | Admitting: Neurology

## 2016-02-01 ENCOUNTER — Ambulatory Visit: Payer: Self-pay | Admitting: Neurology

## 2016-02-01 ENCOUNTER — Encounter (HOSPITAL_COMMUNITY): Payer: Self-pay | Admitting: *Deleted

## 2016-02-01 ENCOUNTER — Telehealth: Payer: Self-pay | Admitting: *Deleted

## 2016-02-01 DIAGNOSIS — Z471 Aftercare following joint replacement surgery: Secondary | ICD-10-CM | POA: Diagnosis not present

## 2016-02-01 DIAGNOSIS — M25561 Pain in right knee: Secondary | ICD-10-CM | POA: Insufficient documentation

## 2016-02-01 DIAGNOSIS — M25562 Pain in left knee: Secondary | ICD-10-CM | POA: Diagnosis not present

## 2016-02-01 DIAGNOSIS — Z791 Long term (current) use of non-steroidal anti-inflammatories (NSAID): Secondary | ICD-10-CM | POA: Diagnosis not present

## 2016-02-01 DIAGNOSIS — Z79899 Other long term (current) drug therapy: Secondary | ICD-10-CM | POA: Diagnosis not present

## 2016-02-01 DIAGNOSIS — Z96652 Presence of left artificial knee joint: Secondary | ICD-10-CM | POA: Diagnosis not present

## 2016-02-01 DIAGNOSIS — M5442 Lumbago with sciatica, left side: Secondary | ICD-10-CM | POA: Insufficient documentation

## 2016-02-01 DIAGNOSIS — I10 Essential (primary) hypertension: Secondary | ICD-10-CM | POA: Diagnosis not present

## 2016-02-01 DIAGNOSIS — M545 Low back pain: Secondary | ICD-10-CM | POA: Diagnosis present

## 2016-02-01 MED ORDER — KETOROLAC TROMETHAMINE 60 MG/2ML IM SOLN
60.0000 mg | Freq: Once | INTRAMUSCULAR | Status: AC
  Administered 2016-02-01: 60 mg via INTRAMUSCULAR
  Filled 2016-02-01: qty 2

## 2016-02-01 NOTE — Telephone Encounter (Signed)
Social Worker Referral placed yesterday and family notified.

## 2016-02-01 NOTE — Telephone Encounter (Signed)
Raquel Sarna, daughter in law called and stated that patient went to the ER last night due to pain and they gave her a Toradol Injection.  Patient also has been complaining about her knee hurting and scheduled an appointment with Dr. Matilde Haymaker at [facility]. They want you to call him and let him know whats going on with the patient and not to prescribe her any narcotics.

## 2016-02-01 NOTE — Telephone Encounter (Signed)
Please notify Dr. Charlestine Night office at [facility] that pt is seeking opioid medications and abusing them.  [name] not to receive opioids.  We are trying to get her clean.

## 2016-02-01 NOTE — Telephone Encounter (Signed)
This is a late entry for yesterday: 01/31/16, patient called after hours call center around 10 PM, I called her back around the time. She complained of acute left hip pain, reported that Dr. Jannifer Franklin treats her for neuropathy. Patient requested pain medication and was tearful on the phone. She reported no recent fall. She reported low back with radiation to the left. She also reported recent back surgery in July under Dr. Ellene Route. I advised patient that if she had acute onset of pain, it could have something to do with her left hip or low back, including disc herniation, which would be impossible for me to assess over the phone. It did not sound like a flare up of pain from peripheral neuropathy. I advised patient to have someone take her to the emergency room or call 911. Also advised her that she could get in touch with her neurosurgical office and the physician on-call for Dr. Ellene Route to seek further advice. I advised patient that I would pass on her message and our phone conversation to Dr. Jannifer Franklin and his nurse.

## 2016-02-01 NOTE — Telephone Encounter (Signed)
This patient did not show for a revisit appointment today, she instead went to the emergency room for evaluation of pain.

## 2016-02-01 NOTE — Telephone Encounter (Signed)
I'd prefer not to call in the migraine medication at all b/c she is not using it properly at this time.  If we do one at a time, she will be wanting one every day with this addiction.    Did you put in the social work referral?

## 2016-02-01 NOTE — Telephone Encounter (Signed)
Spoke with Hoyle Sauer at [facility] [phone], patient has an appointment today at 3:15, she has flagged the chart.

## 2016-02-01 NOTE — ED Provider Notes (Signed)
[facility] DEPT Provider Note   CSN: [id number] Arrival date & time: 02/01/16  0353     History   Chief Complaint Chief Complaint  Patient presents with  . Back Pain  . Knee Pain    HPI [name] is a 75 y.o. female.  75 year old female with a history of arthritis and degenerative joint disease, chronic back pain, hypertension, peripheral neuropathy, and lumbar radiculopathy presents to the emergency department for complaints of worsening bilateral knee pain. She reports history of bilateral knee replacement. She has noted some sharp, stabbing pain in her knees which is worse on the left compared to the right. Patient has been taking Aleve for symptoms without relief. She also notes having left-sided low back pain which radiates to her left hip. She has a history of back surgery and states that she usually experiences right-sided sciatica. She has tried Tylenol for this with little improvement. She denies any recent trauma, injury, or fall inciting her back or knee pain. She has not had any recent fevers. No bowel or bladder incontinence. Patient states that she has follow-up with her orthopedic surgeon today. She has been unable to see her neurosurgeon, Dr. Ellene Route regarding her back pain.      Past Medical History:  Diagnosis Date  . Anxiety   . Arthritis    djd  . Cataracts, bilateral    immature   . Cellulitis    10/11/11 hospitalized for cellulitis   . Chronic back pain    scoliosis/stenosis  . Depression    takes Zoloft daily  . Depression   . Dysrhythmia    hx tachy palpitations  . Elbow fracture, left April 2015  . GERD (gastroesophageal reflux disease)    hx pud '98  . Headache(784.0)    migraines yrs ago  . History of bronchitis    many yrs ago  . Hyperlipemia    takes Atorvastatin daily  . Hypertension    takes HCTZ daily  . Insomnia   . Joint pain   . Joint swelling   . Neuromuscular disorder ([place])    peripheral neuropathy, FEET AND LEGS  .  Peripheral neuropathy ([place])   . Peripheral neuropathy (HCC)    takes Gabapentin daily  . 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 Radiculopathy, lumbar region   . Restless leg syndrome    takes Sinemet daily  . Stroke [facility])    ? 6 months ago - tests okay - [facility]   . Vertigo     Patient Active Problem List   Diagnosis Date Noted  . Herniated nucleus pulposus, L2-3 right 12/19/2015  . Right foot pain 07/22/2015  . Chest pain 07/11/2015  . History of drug overdose 07/07/2015  . Frequent falls 07/07/2015  . Hyperlipidemia 07/07/2015  . Essential hypertension 07/07/2015  . Spinal stenosis, lumbar region, with neurogenic claudication 07/07/2015  . Severe episode of recurrent major depressive disorder, with psychotic features ([place]) 07/07/2015  . Visual hallucination 07/01/2015  . Delirium   . Severe episode of recurrent major depressive disorder, without psychotic features ([place])   . Back pain 06/20/2015  . MDD (major depressive disorder), recurrent episode, severe ([place]) 06/16/2015  . Lumbar stenosis 04/19/2015  . Hypokalemia 09/19/2014  . Constipation 09/19/2014  . Acute encephalopathy 09/18/2014  . Acute respiratory failure ([place]) 09/17/2014  . OSA (obstructive sleep apnea) 09/17/2014  . Ankle fracture, lateral malleolus, closed 09/14/2014  . Major depressive disorder, recurrent severe without psychotic features (  [place]) 08/07/2014  . Insomnia 08/05/2014  . Hip joint painful on movement 08/05/2014  . Degenerative disc disease, lumbar 08/05/2014  . Severe obesity (BMI >= 40) ([place]) 01/11/2014  . Depression   . Arthritis   . Polyneuropathy in other diseases classified elsewhere ([place]) 04/08/2013  . Restless legs syndrome (RLS) 04/08/2013  . UTI (lower urinary tract infection) 10/14/2011  . Dizziness 10/12/2011  . Fall 10/12/2011  . TIA (transient ischemic attack) 10/12/2011  . Cellulitis 10/12/2011  . GERD (gastroesophageal reflux disease) 10/12/2011    Past Surgical  History:  Procedure Laterality Date  . ABDOMINAL HYSTERECTOMY  1975   bil oophorectomy  . BACK SURGERY   MANY YRS AGO   laminectomy x3  . CARPAL TUNNEL RELEASE  07/09/2012   Procedure: CARPAL TUNNEL RELEASE;  Surgeon: Magnus Sinning, MD;  Location: [facility];  Service: Orthopedics;  Laterality: Left;  . cervical disc surgery x2  2011  . CHOLECYSTECTOMY  1993  . colonosocpy    . ESOPHAGOGASTRODUODENOSCOPY    . FINGER ARTHROPLASTY  07/09/2012   Procedure: FINGER ARTHROPLASTY;  Surgeon: Magnus Sinning, MD;  [facility];  Service: Orthopedics;  Laterality: Left;  Interposition Arthroplasty CMC Joint Thumb Left   . HEMATOMA EVACUATION  2011   s/p rt cea  . JOINT REPLACEMENT  '01   total knee replacement, LEFT  . left knee arthroscopy  2001  . LIPOMA EXCISION  07/09/2012   Procedure: EXCISION LIPOMA;  Surgeon: Magnus Sinning, MD;  [facility];  Service: Orthopedics;  Laterality: Left;  Excision Lipoma Dorsum Left Wrist   . LUMBAR LAMINECTOMY WITH COFLEX 1 LEVEL Bilateral 04/19/2015   Procedure: LUMBAR TWO-THREE LUMBAR LAMINECTOMY WITH COFLEX ;  Surgeon: Kristeen Miss, MD;  Location: [facility];  Service: Neurosurgery;  Laterality: Bilateral;  Bilateral L23 laminectomy and foraminotomy with coflex  . LUMBAR LAMINECTOMY/DECOMPRESSION MICRODISCECTOMY Right 12/19/2015   Procedure: Right Lumbar two-three  Microdiskectomy;  Surgeon: Kristeen Miss, MD;  Location: [place];  Service: Neurosurgery;  Laterality: Right;  . ORIF ANKLE FRACTURE Right 09/14/2014   FIBULA   . ORIF ANKLE FRACTURE Right 09/14/2014   Procedure: OPEN REDUCTION INTERNAL FIXATION (ORIF) RIGHT ANKLE FRACTURE/SYNDESMOSIS ;  Surgeon: Wylene Simmer, MD;  Location: [facility];  Service: Orthopedics;  Laterality: Right;  . right knee replacement     09/2011   . rt carotid enarterectomy  2011  . rt knee arthroscopy  '99  . TOTAL KNEE ARTHROPLASTY  09/13/2011   Procedure: TOTAL KNEE ARTHROPLASTY;  Surgeon: Magnus Sinning, MD;   [facility];  Service: Orthopedics;  Laterality: Right;    OB History    No data available       Home Medications    Prior to Admission medications   Medication Sig Start Date End Date Taking? Authorizing Provider  atorvastatin (LIPITOR) 40 MG tablet Take 40 mg by mouth daily.   Yes Historical Provider, MD  cholecalciferol (VITAMIN D) 1000 units tablet Take 1,000 Units by mouth daily.   Yes Historical Provider, MD  cyclobenzaprine (FLEXERIL) 10 MG tablet Take 10 mg by mouth 3 (three) times daily as needed for muscle spasms.    Yes Historical Provider, MD  docusate sodium (COLACE) 100 MG capsule Take 100 mg by mouth daily as needed for mild constipation.   Yes Historical Provider, MD  ferrous sulfate 325 (65 FE) MG EC tablet Take 325 mg by mouth 2 (two) times daily.    Yes Historical Provider, MD  gabapentin (NEURONTIN) 400 MG capsule Take 400 mg by mouth 4 (four) times daily.   Yes Historical Provider, MD  lisinopril (PRINIVIL,ZESTRIL) 5 MG tablet Take 5 mg by mouth daily.   Yes Historical Provider, MD  metoprolol (LOPRESSOR) 50 MG tablet Take 50 mg by mouth 2 (two) times daily.   Yes Historical Provider, MD  naproxen sodium (ANAPROX) 220 MG tablet Take 220 mg by mouth 2 (two) times daily as needed (for pain).    Yes Historical Provider, MD  pantoprazole (PROTONIX) 40 MG tablet Take 40 mg by mouth daily.    Yes Historical Provider, MD  pyridOXINE (VITAMIN B-6) 50 MG tablet Take 50 mg by mouth daily.   Yes Historical Provider, MD  sertraline (ZOLOFT) 50 MG tablet Take 2 tablets (100 mg total) by mouth daily. 07/07/15  Yes [name] L [name], DO  sodium chloride (OCEAN) 0.65 % SOLN nasal spray Place 1 spray into both nostrils as needed for congestion. 06/28/15  Yes Encarnacion Slates, NP    Family History Family History  Problem Relation Age of Onset  . Congestive Heart Failure Father   . Congestive Heart Failure Sister   . Alzheimer's disease Mother   . Diabetes Brother   . Arthritis  Brother     Social History Social History  Substance Use Topics  . Smoking status: Never Smoker  . Smokeless tobacco: Never Used  . Alcohol use No     Allergies   Contrast media [iodinated diagnostic agents]; Acyclovir and related; and Sulfa drugs cross reactors   Review of Systems Review of Systems  Constitutional: Negative for fever.  Genitourinary:       No bowel/bladder incontinence.  Musculoskeletal: Positive for arthralgias, back pain and myalgias.  Ten systems reviewed and are negative for acute change, except as noted in the HPI.     Physical Exam Updated Vital Signs BP (!) 159/104 (BP Location: Right Arm)   Pulse 88   Temp 97.6 F (36.4 C) (Oral)   Resp 19   SpO2 98%   Physical Exam  Constitutional: [name] oriented to person, place, and time. She appears well-developed and well-nourished. No distress.  Nontoxic appearing  HENT:  Head: Normocephalic and atraumatic.  Eyes: Conjunctivae and EOM are normal. No scleral icterus.  Neck: Normal range of motion.  Pulmonary/Chest: Effort normal. No respiratory distress.  Respirations even and unlabored  Musculoskeletal: Normal range of motion.  Normal ROM of b/l knees. No erythema, heat to touch, or red linear streaking. No bony deformity or crepitus. Patient also with TTP to left lumbar paraspinal muscles. No crepitus appreciated. No bony deformities, step offs, or crepitus to the lumbosacral midline.  Neurological: [name] alert and oriented to person, place, and time.  Ambulatory with steady gait. Sensation to light touch intact in all extremities.  Skin: Skin is warm and dry. No rash noted. [name] not diaphoretic. No erythema. No pallor.  Psychiatric: She has a normal mood and affect. Her behavior is normal.  Nursing note and vitals reviewed.    ED Treatments / Results  Labs (all labs ordered are listed, but only abnormal results are displayed) Labs Reviewed - No data to display  EKG  EKG  Interpretation None       Radiology No results found.  Procedures Procedures (including critical care time)  Medications Ordered in ED Medications  ketorolac (TORADOL) injection 60 mg (60 mg Intramuscular Given 02/01/16 0603)     Initial Impression / Assessment and Plan / ED  Course  I have reviewed the triage vital signs and the nursing notes.  Pertinent labs & imaging results that were available during my care of the patient were reviewed by me and considered in my medical decision making (see chart for details).  Clinical Course    75 year old female with known chronic pain presents to the emergency department for acute exacerbation of chronic knee and back pain. Back pain consistent with sciatica. Patient is neurovascularly intact. She has no red flags or signs concerning for cauda equina. Patient is ambulatory today without difficulty. She has no evidence of septic joint to her bilateral knees. Patient with long-standing history of narcotic use. She recently underwent "detox" for use of tramadol. It is unclear what goals of care are with respect to pain control; however, I have had a long discussion with the patient and her son about the benefits of pain management.  Symptoms treated in the emergency department with Toradol. Seeing that patient has follow-up with her orthopedic surgeon today, will refrain from providing a prescription for opiates. The patient has been advised to continue with supportive care until her follow-up appointment. Return precautions discussed and provided. Patient discharged in satisfactory condition.   Final Clinical Impressions(s) / ED Diagnoses   Final diagnoses:  Left-sided low back pain with left-sided sciatica  Knee pain, bilateral    New Prescriptions Discharge Medication List as of 02/01/2016  6:15 AM       Antonietta Breach, PA-C 02/01/16 [id number]    April Palumbo, MD 02/01/16 2349

## 2016-02-01 NOTE — ED Triage Notes (Signed)
Pt states that she has chronic back pain and that she began having pain yesterday; pt denies injury; pt states that she is also having left knee pain; pt states that she has a hx of knee replacement; pt states that the pain has gotten progressively worse; pt denies injury to back or knee

## 2016-02-02 ENCOUNTER — Other Ambulatory Visit: Payer: Self-pay | Admitting: Internal Medicine

## 2016-02-02 ENCOUNTER — Emergency Department (HOSPITAL_COMMUNITY)
Admission: EM | Admit: 2016-02-02 | Discharge: 2016-02-02 | Disposition: A | Discharge status: Home / Self Care | Payer: Medicare Other | Attending: Emergency Medicine | Admitting: Emergency Medicine

## 2016-02-02 ENCOUNTER — Ambulatory Visit: Payer: Medicare Other | Admitting: Internal Medicine

## 2016-02-02 ENCOUNTER — Encounter (HOSPITAL_COMMUNITY): Payer: Self-pay | Admitting: Emergency Medicine

## 2016-02-02 DIAGNOSIS — M545 Low back pain: Secondary | ICD-10-CM | POA: Diagnosis present

## 2016-02-02 DIAGNOSIS — N390 Urinary tract infection, site not specified: Secondary | ICD-10-CM | POA: Diagnosis not present

## 2016-02-02 DIAGNOSIS — Z79899 Other long term (current) drug therapy: Secondary | ICD-10-CM | POA: Diagnosis not present

## 2016-02-02 DIAGNOSIS — R030 Elevated blood-pressure reading, without diagnosis of hypertension: Secondary | ICD-10-CM | POA: Diagnosis not present

## 2016-02-02 DIAGNOSIS — M549 Dorsalgia, unspecified: Secondary | ICD-10-CM

## 2016-02-02 DIAGNOSIS — I10 Essential (primary) hypertension: Secondary | ICD-10-CM | POA: Diagnosis not present

## 2016-02-02 DIAGNOSIS — F1120 Opioid dependence, uncomplicated: Secondary | ICD-10-CM

## 2016-02-02 DIAGNOSIS — G8929 Other chronic pain: Secondary | ICD-10-CM | POA: Insufficient documentation

## 2016-02-02 DIAGNOSIS — M25562 Pain in left knee: Secondary | ICD-10-CM | POA: Diagnosis not present

## 2016-02-02 LAB — URINALYSIS, ROUTINE W REFLEX MICROSCOPIC
BILIRUBIN URINE: NEGATIVE
GLUCOSE, UA: NEGATIVE mg/dL
Ketones, ur: NEGATIVE mg/dL
Nitrite: NEGATIVE
PROTEIN: NEGATIVE mg/dL
SPECIFIC GRAVITY, URINE: 1.024 (ref 1.005–1.030)
pH: 5.5 (ref 5.0–8.0)

## 2016-02-02 LAB — URINE MICROSCOPIC-ADD ON

## 2016-02-02 MED ORDER — KETOROLAC TROMETHAMINE 60 MG/2ML IM SOLN
60.0000 mg | Freq: Once | INTRAMUSCULAR | Status: AC
  Administered 2016-02-02: 60 mg via INTRAMUSCULAR
  Filled 2016-02-02: qty 2

## 2016-02-02 MED ORDER — ONDANSETRON 8 MG PO TBDP
8.0000 mg | ORAL_TABLET | Freq: Once | ORAL | Status: AC
  Administered 2016-02-02: 8 mg via ORAL
  Filled 2016-02-02: qty 1

## 2016-02-02 MED ORDER — CEPHALEXIN 500 MG PO CAPS
500.0000 mg | ORAL_CAPSULE | Freq: Once | ORAL | Status: AC
  Administered 2016-02-02: 500 mg via ORAL
  Filled 2016-02-02: qty 1

## 2016-02-02 MED ORDER — CEPHALEXIN 500 MG PO CAPS: 500.0000 mg | ORAL_CAPSULE | Freq: Four times a day (QID) | ORAL | 0 refills | Status: DC

## 2016-02-02 NOTE — ED Notes (Signed)
Pt urinated behind urine collection hat; no urine was collected; MD aware

## 2016-02-02 NOTE — ED Notes (Signed)
Bed: [id number] Expected date:  Expected time:  Means of arrival:  Comments: Dark tarry stools

## 2016-02-02 NOTE — ED Triage Notes (Addendum)
Pt BIB EMS from home; pt began vomiting last night around 11pm; pt suspecting emesis was due to extreme pain in left knee and back; pt was seen yesterday for pain and given toradal which she states did not help for very long; pt has hx of bilateral knee replacements and multiple back surgeries.  Pt had bleeding ulcer two weeks ago and received blood transfusion; EMS states that emesis from today did not contain any blood.

## 2016-02-02 NOTE — ED Provider Notes (Signed)
[facility] DEPT Provider Note   CSN: [id number] Arrival date & time: 02/02/16  0456     History   Chief Complaint Chief Complaint  Patient presents with  . Emesis  . Back Pain    HPI [name] is a 75 y.o. female.  The history is provided by the patient.  Emesis   This is a new problem. The current episode started 6 to 12 hours ago. The problem has not changed since onset.The emesis has an appearance of stomach contents. There has been no fever. Pertinent negatives include no abdominal pain, no chills, no diarrhea, no fever and no headaches.  Back Pain   This is a chronic problem. The current episode started more than 1 week ago. The problem occurs constantly. The problem has not changed since onset.The pain is associated with no known injury. The pain is present in the lumbar spine. The pain does not radiate. The pain is severe. Pertinent negatives include no fever, no numbness, no headaches, no abdominal pain and no weakness. The treatment provided no relief. Risk factors include menopause.  This is a patient who has a h/o back pain and narcotic abuse who presents with ongoing back pain.  She has not been following up with neurology or spine as directed.    Past Medical History:  Diagnosis Date  . Anxiety   . Arthritis    djd  . Cataracts, bilateral    immature   . Cellulitis    10/11/11 hospitalized for cellulitis   . Chronic back pain    scoliosis/stenosis  . Depression    takes Zoloft daily  . Depression   . Dysrhythmia    hx tachy palpitations  . Elbow fracture, left [facility] [place] 2015  . GERD (gastroesophageal reflux disease)    hx pud '98  . Headache(784.0)    migraines yrs ago  . History of bronchitis    many yrs ago  . Hyperlipemia    takes Atorvastatin daily  . Hypertension    takes HCTZ daily  . Insomnia   . Joint pain   . Joint swelling   . Neuromuscular disorder ([place])    peripheral neuropathy, FEET AND LEGS  . Peripheral neuropathy ([place])   .  Peripheral neuropathy (HCC)    takes Gabapentin daily  . 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 Radiculopathy, lumbar region   . Restless leg syndrome    takes Sinemet daily  . Stroke [facility])    ? 6 months ago - tests okay - [facility]   . Vertigo     Patient Active Problem List   Diagnosis Date Noted  . Herniated nucleus pulposus, L2-3 right 12/19/2015  . Right foot pain 07/22/2015  . Chest pain 07/11/2015  . History of drug overdose 07/07/2015  . Frequent falls 07/07/2015  . Hyperlipidemia 07/07/2015  . Essential hypertension 07/07/2015  . Spinal stenosis, lumbar region, with neurogenic claudication 07/07/2015  . Severe episode of recurrent major depressive disorder, with psychotic features ([place]) 07/07/2015  . Visual hallucination 07/01/2015  . Delirium   . Severe episode of recurrent major depressive disorder, without psychotic features ([place])   . Back pain 06/20/2015  . MDD (major depressive disorder), recurrent episode, severe ([place]) 06/16/2015  . Lumbar stenosis 04/19/2015  . Hypokalemia 09/19/2014  . Constipation 09/19/2014  . Acute encephalopathy 09/18/2014  . Acute respiratory failure ([place]) 09/17/2014  . OSA (obstructive sleep apnea) 09/17/2014  . Ankle fracture, lateral malleolus, closed 09/14/2014  .  Major depressive disorder, recurrent severe without psychotic features ([place]) 08/07/2014  . Insomnia 08/05/2014  . Hip joint painful on movement 08/05/2014  . Degenerative disc disease, lumbar 08/05/2014  . Severe obesity (BMI >= 40) ([place]) 01/11/2014  . Depression   . Arthritis   . Polyneuropathy in other diseases classified elsewhere ([place]) 04/08/2013  . Restless legs syndrome (RLS) 04/08/2013  . UTI (lower urinary tract infection) 10/14/2011  . Dizziness 10/12/2011  . Fall 10/12/2011  . TIA (transient ischemic attack) 10/12/2011  . Cellulitis 10/12/2011  . GERD (gastroesophageal reflux disease) 10/12/2011    Past Surgical History:  Procedure Laterality  Date  . ABDOMINAL HYSTERECTOMY  1975   bil oophorectomy  . BACK SURGERY   MANY YRS AGO   laminectomy x3  . CARPAL TUNNEL RELEASE  07/09/2012   Procedure: CARPAL TUNNEL RELEASE;  Surgeon: Magnus Sinning, MD;  [facility];  Service: Orthopedics;  Laterality: Left;  . cervical disc surgery x2  2011  . CHOLECYSTECTOMY  1993  . colonosocpy    . ESOPHAGOGASTRODUODENOSCOPY    . FINGER ARTHROPLASTY  07/09/2012   Procedure: FINGER ARTHROPLASTY;  Surgeon: Magnus Sinning, MD;  [facility];  Service: Orthopedics;  Laterality: Left;  Interposition Arthroplasty CMC Joint Thumb Left   . HEMATOMA EVACUATION  2011   s/p rt cea  . JOINT REPLACEMENT  '01   total knee replacement, LEFT  . left knee arthroscopy  2001  . LIPOMA EXCISION  07/09/2012   Procedure: EXCISION LIPOMA;  Surgeon: Magnus Sinning, MD;  [facility];  Service: Orthopedics;  Laterality: Left;  Excision Lipoma Dorsum Left Wrist   . LUMBAR LAMINECTOMY WITH COFLEX 1 LEVEL Bilateral 04/19/2015   Procedure: LUMBAR TWO-THREE LUMBAR LAMINECTOMY WITH COFLEX ;  Surgeon: Kristeen Miss, MD;  Location: [facility];  Service: Neurosurgery;  Laterality: Bilateral;  Bilateral L23 laminectomy and foraminotomy with coflex  . LUMBAR LAMINECTOMY/DECOMPRESSION MICRODISCECTOMY Right 12/19/2015   Procedure: Right Lumbar two-three  Microdiskectomy;  Surgeon: Kristeen Miss, MD;  Location: [facility];  Service: Neurosurgery;  Laterality: Right;  . ORIF ANKLE FRACTURE Right 09/14/2014   FIBULA   . ORIF ANKLE FRACTURE Right 09/14/2014   Procedure: OPEN REDUCTION INTERNAL FIXATION (ORIF) RIGHT ANKLE FRACTURE/SYNDESMOSIS ;  Surgeon: Wylene Simmer, MD;  Location: [facility];  Service: Orthopedics;  Laterality: Right;  . right knee replacement     09/2011   . rt carotid enarterectomy  2011  . rt knee arthroscopy  '99  . TOTAL KNEE ARTHROPLASTY  09/13/2011   Procedure: TOTAL KNEE ARTHROPLASTY;  Surgeon: Magnus Sinning, MD;  [facility];  Service:  Orthopedics;  Laterality: Right;    OB History    No data available       Home Medications    Prior to Admission medications   Medication Sig Start Date End Date Taking? Authorizing Provider  atorvastatin (LIPITOR) 40 MG tablet Take 40 mg by mouth daily.   Yes Historical Provider, MD  cholecalciferol (VITAMIN D) 1000 units tablet Take 1,000 Units by mouth daily.   Yes Historical Provider, MD  cyclobenzaprine (FLEXERIL) 10 MG tablet Take 10 mg by mouth 3 (three) times daily as needed for muscle spasms.    Yes Historical Provider, MD  docusate sodium (COLACE) 100 MG capsule Take 100 mg by mouth daily as needed for mild constipation.   Yes Historical Provider, MD  ferrous sulfate 325 (65 FE) MG EC tablet Take 325 mg by mouth 2 (two) times daily.  Yes Historical Provider, MD  gabapentin (NEURONTIN) 400 MG capsule Take 400 mg by mouth 4 (four) times daily.   Yes Historical Provider, MD  lisinopril (PRINIVIL,ZESTRIL) 5 MG tablet Take 5 mg by mouth daily.   Yes Historical Provider, MD  metoprolol (LOPRESSOR) 50 MG tablet Take 50 mg by mouth 2 (two) times daily.   Yes Historical Provider, MD  naproxen sodium (ANAPROX) 220 MG tablet Take 220 mg by mouth 2 (two) times daily as needed (for pain).    Yes Historical Provider, MD  pantoprazole (PROTONIX) 40 MG tablet Take 40 mg by mouth daily.    Yes Historical Provider, MD  pyridOXINE (VITAMIN B-6) 50 MG tablet Take 50 mg by mouth daily.   Yes Historical Provider, MD  sertraline (ZOLOFT) 50 MG tablet Take 2 tablets (100 mg total) by mouth daily. 07/07/15  Yes [name] L [name], DO  sodium chloride (OCEAN) 0.65 % SOLN nasal spray Place 1 spray into both nostrils as needed for congestion. 06/28/15  Yes Encarnacion Slates, NP    Family History Family History  Problem Relation Age of Onset  . Congestive Heart Failure Father   . Congestive Heart Failure Sister   . Alzheimer's disease Mother   . Diabetes Brother   . Arthritis Brother     Social  History Social History  Substance Use Topics  . Smoking status: Never Smoker  . Smokeless tobacco: Never Used  . Alcohol use No     Allergies   Contrast media [iodinated diagnostic agents]; Acyclovir and related; and Sulfa drugs cross reactors   Review of Systems Review of Systems  Constitutional: Negative for chills and fever.  Gastrointestinal: Positive for vomiting. Negative for abdominal pain, blood in stool, constipation and diarrhea.  Genitourinary: Negative for difficulty urinating.  Musculoskeletal: Positive for back pain.  Neurological: Negative for speech difficulty, weakness, numbness and headaches.  All other systems reviewed and are negative.    Physical Exam Updated Vital Signs BP 148/81 (BP Location: Right Arm)   Pulse 101   Temp 97.6 F (36.4 C) (Oral)   Resp 20   SpO2 100%   Physical Exam  Constitutional: [name] oriented to person, place, and time. She appears well-developed and well-nourished. No distress.  HENT:  Head: Normocephalic and atraumatic.  Mouth/Throat: Oropharynx is clear and moist. No oropharyngeal exudate.  Eyes: EOM are normal. Pupils are equal, round, and reactive to light.  Neck: Normal range of motion. Neck supple.  Cardiovascular: Normal rate, regular rhythm and intact distal pulses.   Pulmonary/Chest: Effort normal and breath sounds normal. She has no wheezes.  Abdominal: Soft. Bowel sounds are normal. She exhibits no mass. There is no tenderness. There is no rebound and no guarding.  Musculoskeletal: Normal range of motion.  Neurological: [name] alert and oriented to person, place, and time. She has normal reflexes.  Skin: Skin is warm and dry. Capillary refill takes less than 2 seconds.  Psychiatric: She has a normal mood and affect.     ED Treatments / Results  Labs (all labs ordered are listed, but only abnormal results are displayed) Labs Reviewed  URINALYSIS, ROUTINE W REFLEX MICROSCOPIC (NOT AT [facility])    EKG  EKG  Interpretation None       Radiology No results found.  Procedures Procedures (including critical care time)  Medications Ordered in ED Medications  ketorolac (TORADOL) injection 60 mg (60 mg Intramuscular Given 02/02/16 0548)  ondansetron (ZOFRAN-ODT) disintegrating tablet 8 mg (8 mg Oral Given 02/02/16  0548)    I suspect the emesis is secondary to withdraw from narcotics as the patient has been calling around attempting to get the based on the notes in EPIC.   Exam is benign and reassuring as are vitals. Patient is well hydrated in appearance. She has had recent normal imaging.     Have treated pain with toradol.  Patient is instructed to follow up with her doctors as directed.  She will not be receiving narcotics.  All questions answered to patient's satisfaction. Based on history and exam patient has been appropriately medically screened and emergency conditions excluded. Patient is stable for discharge at this time. Follow up with your PMD for recheck in 2 days and strict return precautions given.  Initial Impression / Assessment and Plan / ED Course  I have reviewed the triage vital signs and the nursing notes.  Pertinent labs & imaging results that were available during my care of the patient were reviewed by me and considered in my medical decision making (see chart for details).  Clinical Course      Final Clinical Impressions(s) / ED Diagnoses   New Prescriptions New Prescriptions   No medications on file     Kebra Lowrimore, MD 02/02/16 [phone]

## 2016-02-02 NOTE — ED Notes (Signed)
Pt ambulated to restroom. 

## 2016-02-06 ENCOUNTER — Encounter (HOSPITAL_COMMUNITY): Payer: Self-pay | Admitting: Emergency Medicine

## 2016-02-06 ENCOUNTER — Emergency Department (HOSPITAL_COMMUNITY)
Admission: EM | Admit: 2016-02-06 | Discharge: 2016-02-06 | Disposition: A | Discharge status: Home / Self Care | Payer: Medicare Other | Attending: Emergency Medicine | Admitting: Emergency Medicine

## 2016-02-06 DIAGNOSIS — I10 Essential (primary) hypertension: Secondary | ICD-10-CM | POA: Diagnosis not present

## 2016-02-06 DIAGNOSIS — G8929 Other chronic pain: Secondary | ICD-10-CM | POA: Diagnosis not present

## 2016-02-06 DIAGNOSIS — M25562 Pain in left knee: Secondary | ICD-10-CM | POA: Diagnosis not present

## 2016-02-06 DIAGNOSIS — M545 Low back pain: Secondary | ICD-10-CM | POA: Diagnosis not present

## 2016-02-06 DIAGNOSIS — Z79899 Other long term (current) drug therapy: Secondary | ICD-10-CM | POA: Diagnosis not present

## 2016-02-06 DIAGNOSIS — M549 Dorsalgia, unspecified: Secondary | ICD-10-CM

## 2016-02-06 MED ORDER — KETOROLAC TROMETHAMINE 60 MG/2ML IM SOLN
30.0000 mg | Freq: Once | INTRAMUSCULAR | Status: AC
  Administered 2016-02-06: 30 mg via INTRAMUSCULAR
  Filled 2016-02-06: qty 2

## 2016-02-06 MED ORDER — ACETAMINOPHEN 500 MG PO TABS
1000.0000 mg | ORAL_TABLET | Freq: Once | ORAL | Status: AC
  Administered 2016-02-06: 1000 mg via ORAL
  Filled 2016-02-06: qty 2

## 2016-02-06 MED ORDER — DIAZEPAM 2 MG PO TABS
2.0000 mg | ORAL_TABLET | Freq: Once | ORAL | Status: AC
  Administered 2016-02-06: 2 mg via ORAL
  Filled 2016-02-06: qty 1

## 2016-02-06 NOTE — ED Provider Notes (Signed)
[facility] DEPT Provider Note   CSN: [id number] Arrival date & time: 02/06/16  0715     History   Chief Complaint Chief Complaint  Patient presents with  . Back Pain  . Knee Pain    HPI [name] is a 75 y.o. female.  75 yo F with a cc of chronic low back pain.  No recent injury. Patient with 4 low back surgeries in past.  Also having left knee pain.  This was also repaired in the past.  Patient had recent PCP visit where they were concerned for narcotic abuse with multiple filled scripts from multiple providers, refusing rehab.  Denies loss of bowel, bladder.  Denies leg weakness.  Imaging as recent as last week.  Called multiple providers today asking for narcotics suggested she come here for pain management.    The history is provided by the patient and a relative.  Back Pain   This is a chronic problem. The current episode started more than 1 week ago. The problem occurs daily. The problem has been gradually worsening. The pain is associated with no known injury. The pain is present in the lumbar spine. The quality of the pain is described as stabbing and shooting. The pain radiates to the left thigh. The pain is at a severity of 8/10. The pain is moderate. The symptoms are aggravated by bending, twisting and certain positions. Pertinent negatives include no chest pain, no fever, no headaches and no dysuria. She has tried nothing (narcotics) for the symptoms. The treatment provided moderate relief. Risk factors include obesity, lack of exercise and a sedentary lifestyle.  Knee Pain      Past Medical History:  Diagnosis Date  . Anxiety   . Arthritis    djd  . Cataracts, bilateral    immature   . Cellulitis    10/11/11 hospitalized for cellulitis   . Chronic back pain    scoliosis/stenosis  . Depression    takes Zoloft daily  . Depression   . Dysrhythmia    hx tachy palpitations  . Elbow fracture, left April 2015  . GERD (gastroesophageal reflux disease)    hx  pud '98  . Headache(784.0)    migraines yrs ago  . History of bronchitis    many yrs ago  . Hyperlipemia    takes Atorvastatin daily  . Hypertension    takes HCTZ daily  . Insomnia   . Joint pain   . Joint swelling   . Neuromuscular disorder ([place])    peripheral neuropathy, FEET AND LEGS  . Peripheral neuropathy ([place])   . Peripheral neuropathy (HCC)    takes Gabapentin daily  . 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 Radiculopathy, lumbar region   . Restless leg syndrome    takes Sinemet daily  . Stroke [facility])    ? 6 months ago - tests okay - [facility]   . Vertigo     Patient Active Problem List   Diagnosis Date Noted  . Herniated nucleus pulposus, L2-3 right 12/19/2015  . Right foot pain 07/22/2015  . Chest pain 07/11/2015  . History of drug overdose 07/07/2015  . Frequent falls 07/07/2015  . Hyperlipidemia 07/07/2015  . Essential hypertension 07/07/2015  . Spinal stenosis, lumbar region, with neurogenic claudication 07/07/2015  . Severe episode of recurrent major depressive disorder, with psychotic features ([place]) 07/07/2015  . Visual hallucination 07/01/2015  . Delirium   . Severe episode of recurrent major depressive  disorder, without psychotic features ([place])   . Back pain 06/20/2015  . MDD (major depressive disorder), recurrent episode, severe ([place]) 06/16/2015  . Lumbar stenosis 04/19/2015  . Hypokalemia 09/19/2014  . Constipation 09/19/2014  . Acute encephalopathy 09/18/2014  . Acute respiratory failure ([place]) 09/17/2014  . OSA (obstructive sleep apnea) 09/17/2014  . Ankle fracture, lateral malleolus, closed 09/14/2014  . Major depressive disorder, recurrent severe without psychotic features ([place]) 08/07/2014  . Insomnia 08/05/2014  . Hip joint painful on movement 08/05/2014  . Degenerative disc disease, lumbar 08/05/2014  . Severe obesity (BMI >= 40) ([place]) 01/11/2014  . Depression   . Arthritis   . Polyneuropathy in other diseases classified elsewhere  ([place]) 04/08/2013  . Restless legs syndrome (RLS) 04/08/2013  . UTI (lower urinary tract infection) 10/14/2011  . Dizziness 10/12/2011  . Fall 10/12/2011  . TIA (transient ischemic attack) 10/12/2011  . Cellulitis 10/12/2011  . GERD (gastroesophageal reflux disease) 10/12/2011    Past Surgical History:  Procedure Laterality Date  . ABDOMINAL HYSTERECTOMY  1975   bil oophorectomy  . BACK SURGERY   MANY YRS AGO   laminectomy x3  . CARPAL TUNNEL RELEASE  07/09/2012   Procedure: CARPAL TUNNEL RELEASE;  Surgeon: Magnus Sinning, MD;  Location: [facility];  Service: Orthopedics;  Laterality: Left;  . cervical disc surgery x2  2011  . CHOLECYSTECTOMY  1993  . colonosocpy    . ESOPHAGOGASTRODUODENOSCOPY    . FINGER ARTHROPLASTY  07/09/2012   Procedure: FINGER ARTHROPLASTY;  Surgeon: Magnus Sinning, MD;  Location: [facility];  Service: Orthopedics;  Laterality: Left;  Interposition Arthroplasty [facility] Joint Thumb Left   . HEMATOMA EVACUATION  2011   s/p rt cea  . JOINT REPLACEMENT  '01   total knee replacement, LEFT  . left knee arthroscopy  2001  . LIPOMA EXCISION  07/09/2012   Procedure: EXCISION LIPOMA;  Surgeon: Magnus Sinning, MD;  Location: [facility];  Service: Orthopedics;  Laterality: Left;  Excision Lipoma Dorsum Left Wrist   . LUMBAR LAMINECTOMY WITH COFLEX 1 LEVEL Bilateral 04/19/2015   Procedure: LUMBAR TWO-THREE LUMBAR LAMINECTOMY WITH COFLEX ;  Surgeon: Kristeen Miss, MD;  Location: [facility];  Service: Neurosurgery;  Laterality: Bilateral;  Bilateral L23 laminectomy and foraminotomy with coflex  . LUMBAR LAMINECTOMY/DECOMPRESSION MICRODISCECTOMY Right 12/19/2015   Procedure: Right Lumbar two-three  Microdiskectomy;  Surgeon: Kristeen Miss, MD;  Location: [facility] ORS;  Service: Neurosurgery;  Laterality: Right;  . ORIF ANKLE FRACTURE Right 09/14/2014   FIBULA   . ORIF ANKLE FRACTURE Right 09/14/2014   Procedure: OPEN REDUCTION INTERNAL FIXATION (ORIF) RIGHT ANKLE FRACTURE/SYNDESMOSIS ;   Surgeon: Wylene Simmer, MD;  Location: [facility];  Service: Orthopedics;  Laterality: Right;  . right knee replacement     09/2011   . rt carotid enarterectomy  2011  . rt knee arthroscopy  '99  . TOTAL KNEE ARTHROPLASTY  09/13/2011   Procedure: TOTAL KNEE ARTHROPLASTY;  Surgeon: Magnus Sinning, MD;  Location: [facility];  Service: Orthopedics;  Laterality: Right;    OB History    No data available       Home Medications    Prior to Admission medications   Medication Sig Start Date End Date Taking? Authorizing Provider  atorvastatin (LIPITOR) 40 MG tablet Take 40 mg by mouth daily.    Historical Provider, MD  cephALEXin (KEFLEX) 500 MG capsule Take 1 capsule (500 mg total) by mouth 4 (four) times daily. 02/02/16   Veatrice Kells, MD  cholecalciferol (VITAMIN D) 1000 units tablet Take 1,000 Units by mouth daily.    Historical Provider, MD  cyclobenzaprine (FLEXERIL) 10 MG tablet Take 10 mg by mouth 3 (three) times daily as needed for muscle spasms.     Historical Provider, MD  docusate sodium (COLACE) 100 MG capsule Take 100 mg by mouth daily as needed for mild constipation.    Historical Provider, MD  ferrous sulfate 325 (65 FE) MG EC tablet Take 325 mg by mouth 2 (two) times daily.     Historical Provider, MD  gabapentin (NEURONTIN) 400 MG capsule Take 400 mg by mouth 4 (four) times daily.    Historical Provider, MD  lisinopril (PRINIVIL,ZESTRIL) 5 MG tablet Take 5 mg by mouth daily.    Historical Provider, MD  metoprolol (LOPRESSOR) 50 MG tablet Take 50 mg by mouth 2 (two) times daily.    Historical Provider, MD  naproxen sodium (ANAPROX) 220 MG tablet Take 220 mg by mouth 2 (two) times daily as needed (for pain).     Historical Provider, MD  pantoprazole (PROTONIX) 40 MG tablet Take 40 mg by mouth daily.     Historical Provider, MD  pyridOXINE (VITAMIN B-6) 50 MG tablet Take 50 mg by mouth daily.    Historical Provider, MD  sertraline (ZOLOFT) 50 MG tablet Take 2 tablets (100 mg total) by  mouth daily. 07/07/15   [name] L [name], DO  sodium chloride (OCEAN) 0.65 % SOLN nasal spray Place 1 spray into both nostrils as needed for congestion. 06/28/15   Encarnacion Slates, NP    Family History Family History  Problem Relation Age of Onset  . Congestive Heart Failure Father   . Congestive Heart Failure Sister   . Alzheimer's disease Mother   . Diabetes Brother   . Arthritis Brother     Social History Social History  Substance Use Topics  . Smoking status: Never Smoker  . Smokeless tobacco: Never Used  . Alcohol use No     Allergies   Contrast media [iodinated diagnostic agents]; Acyclovir and related; and Sulfa drugs cross reactors   Review of Systems Review of Systems  Constitutional: Negative for chills and fever.  HENT: Negative for congestion and rhinorrhea.   Eyes: Negative for redness and visual disturbance.  Respiratory: Negative for shortness of breath and wheezing.   Cardiovascular: Negative for chest pain and palpitations.  Gastrointestinal: Negative for nausea and vomiting.  Genitourinary: Negative for dysuria and urgency.  Musculoskeletal: Positive for arthralgias, back pain, gait problem and myalgias.  Skin: Negative for pallor and wound.  Neurological: Negative for dizziness and headaches.     Physical Exam Updated Vital Signs BP (!) 158/108 (BP Location: Left Arm)   Pulse 108   Temp 98.7 F (37.1 C) (Oral)   Resp 20   Ht 5\' 5"  (1.651 m)   Wt 178 lb (80.7 kg)   SpO2 97%   BMI 29.62 kg/m   Physical Exam  Constitutional: [name] oriented to person, place, and time. She appears well-developed and well-nourished. No distress.  HENT:  Head: Normocephalic and atraumatic.  Eyes: EOM are normal. Pupils are equal, round, and reactive to light.  Neck: Normal range of motion. Neck supple.  Cardiovascular: Normal rate and regular rhythm.  Exam reveals no gallop and no friction rub.   No murmur heard. Pulmonary/Chest: Effort normal. She has no wheezes.  She has no rales.  Abdominal: Soft. She exhibits no distension. There is no tenderness.  Musculoskeletal: She exhibits tenderness (  mild left SI joint). She exhibits no edema.  - slr test bilaterally.  No focal knee ttp.  PMS intact distally.  Ambulatory.   Neurological: [name] alert and oriented to person, place, and time.  Skin: Skin is warm and dry. [name] not diaphoretic.  Psychiatric: She has a normal mood and affect. Her behavior is normal.  Nursing note and vitals reviewed.    ED Treatments / Results  Labs (all labs ordered are listed, but only abnormal results are displayed) Labs Reviewed - No data to display  EKG  EKG Interpretation None       Radiology No results found.  Procedures Procedures (including critical care time)  Medications Ordered in ED Medications  acetaminophen (TYLENOL) tablet 1,000 mg (not administered)  ketorolac (TORADOL) injection 30 mg (not administered)  diazepam (VALIUM) tablet 2 mg (not administered)     Initial Impression / Assessment and Plan / ED Course  I have reviewed the triage vital signs and the nursing notes.  Pertinent labs & imaging results that were available during my care of the patient were reviewed by me and considered in my medical decision making (see chart for details).  Clinical Course    75 yo F with chronic back and knee pain.  Multiple visits for the same over the past couple weeks.  I see no need for imaging without neuro or pulse deficit and no new injury. Follow up with her PCP and surgeons.   7:48 AM:  I have discussed the diagnosis/risks/treatment options with the patient and family and believe the pt to be eligible for discharge home to follow-up with PCP. We also discussed returning to the ED immediately if new or worsening sx occur. We discussed the sx which are most concerning (e.g., sudden worsening pain, fever, inability to tolerate by mouth) that necessitate immediate return. Medications administered to  the patient during their visit and any new prescriptions provided to the patient are listed below.  Medications given during this visit Medications  acetaminophen (TYLENOL) tablet 1,000 mg (not administered)  ketorolac (TORADOL) injection 30 mg (not administered)  diazepam (VALIUM) tablet 2 mg (not administered)     The patient appears reasonably screen and/or stabilized for discharge and I doubt any other medical condition or other [facility] requiring further screening, evaluation, or treatment in the ED at this time prior to discharge.    Final Clinical Impressions(s) / ED Diagnoses   Final diagnoses:  Chronic back pain  Chronic knee pain, left    New Prescriptions New Prescriptions   No medications on file     Deno Etienne, DO 02/06/16 [id number]

## 2016-02-06 NOTE — ED Triage Notes (Signed)
Pt complaint of acute chronic lower back pain and left knee pain worsening over past few days. Denies injury.

## 2016-02-06 NOTE — Discharge Instructions (Signed)
Take 2 over the counter ibuprofen tablets 3 times a day or 1 over-the-counter naproxen tablets twice a day for pain. Take this with food.  Take zantac twice a day as well.  Also take tylenol 1000mg (2 extra strength) four times a day.

## 2016-02-07 ENCOUNTER — Encounter: Payer: Self-pay | Admitting: Neurology

## 2016-02-08 ENCOUNTER — Telehealth: Payer: Self-pay

## 2016-02-08 ENCOUNTER — Other Ambulatory Visit: Payer: Self-pay

## 2016-02-08 NOTE — Telephone Encounter (Signed)
Patient called requesting to speak with triage assistant. Patient was in tears and states she is having left knee pain (knee was replaced about 13 years ago). Patient tried tylenol and tylenol arthritis with no relief. Patient called specialist Dr.Gioffre and they will not give her anything to assist with pain.   Patient states she is pleading with Dr.[name] to give her something to help with this pain.  Please advise

## 2016-02-08 NOTE — Patient Outreach (Signed)
[facility]) Care Management  02/08/2016  [name] [birth year] [id number]  REFERRAL DATE; 02/02/16  REFERRAL SOURCE:  Primary MD  REFERRAL REASON; Uncomplicated opioid dependence.  Primary MD office request patients son, Konrad Dolores with appointment arrangements.   Telephone call to primary MD office regarding referral.    Spoke with Inez Catalina in primary MD office and requested copy of health care power of attorney for patient.  Inez Catalina stated she did not see a health care power of attorney on file for patient.  Inez Catalina transferred call to referral department.  Message left with call back number for Cordella Register, in referral department. Requested return call.  PLAN:  RNCM will await return call from Cordella Register.  If no return call within 3 business days will attempt 2nd outreach attempt.   Quinn Plowman RN,BSN,CCM [facility] Telephonic  [phone]

## 2016-02-08 NOTE — Telephone Encounter (Signed)
Discussed with patient, patient verbalized understanding of recommendation  

## 2016-02-08 NOTE — Telephone Encounter (Signed)
Apply salon pas patch otc over knee daily.

## 2016-02-09 ENCOUNTER — Other Ambulatory Visit: Payer: Self-pay

## 2016-02-09 NOTE — Patient Outreach (Signed)
[facility]) Care Management  02/09/2016  [name] 1940/10/31 [id number]  REFERRAL DATE: 02/02/16  REFERRAL SOURCE:  Primary MD REFERRAL REASON: Uncomplicated opioid dependence  Telephone call to patient regarding primary MD referral. Unable to reach patient. HIPAA compliant voice message left with call back phone number.   PLAN; RNCM will attempt 2nd telephone call to within 1 week.   Quinn Plowman RN,BSN,CCM [facility] Telephonic  [phone]

## 2016-02-10 ENCOUNTER — Other Ambulatory Visit: Payer: Self-pay

## 2016-02-10 ENCOUNTER — Telehealth: Payer: Self-pay | Admitting: *Deleted

## 2016-02-10 NOTE — Patient Outreach (Signed)
[facility]) Care Management  02/10/2016  [name] 1941-02-20 [id number]   REFERRAL DATE: 02/02/16               REFERRAL SOURCE:  Primary MD REFERRAL REASON: Uncomplicated opioid dependence  SUBJECTIVE:  Telephone call to patient regarding primary MD referral.  HIPAA verified with patient. Discussed and offered [facility] care management services to patient. Patient refused services. Patient verbally agreed to receive [facility] care management outreach letter and brochure.    PLAN; RNCM notified patients primary MD office of refusal of services. Message left for referral coordinator with Dr. Cyndi Lennert office.  RNCM will forward patient to [place] [facility] to close due to refusal of services.  RNCM will send patient [facility] care management outreach letter and brochure as discussed.   Quinn Plowman RN,BSN,CCM [facility] Telephonic  [phone]

## 2016-02-10 NOTE — Telephone Encounter (Signed)
Bernette Redbird with [facility] called and stated that she received the referral and has finally gotten in touch with the patient to set up a time and patient REFUSED services. [facility] stated that they could not contact the son because there is no HCPOA on file. She just wanted to make you aware.

## 2016-02-13 NOTE — Telephone Encounter (Signed)
We have a designated party release on file that she signed where we can communicate with her son.

## 2016-02-15 NOTE — Telephone Encounter (Signed)
The only DPR I see on file is dated 01/24/2013 stating we can release information to Ewing Schlein and he doesn't want anything else to do with it.  There is one on file dated this year but it is from [facility] and it is marked for [facility] Only.

## 2016-02-15 NOTE — Telephone Encounter (Signed)
Well, I don't know what else to say about it.  She does not want to help herself.  I've tried very hard with this.  At least she should not be getting any opioids or sedatives at this point from any of the providers aware of this situation.  I think we can also put a report into the [place] site so that other providers outside of [facility] can be aware, as well.  I have to look into how to do it.

## 2016-02-16 ENCOUNTER — Telehealth: Payer: Self-pay

## 2016-02-16 ENCOUNTER — Other Ambulatory Visit: Payer: Self-pay | Admitting: Internal Medicine

## 2016-02-16 NOTE — Telephone Encounter (Signed)
The pharmacy called as a FYI, rx was sent to them for tramadol from another provider. The pharmacy stated they were told to call here if any rx's came over from someone other than the [facility]

## 2016-02-16 NOTE — Telephone Encounter (Signed)
Patient tried Extra strength tylenol, aleve and salon pas for back pain with no relief. Patient is requesting a prescribed medication to treat back pain.  Please advise

## 2016-02-16 NOTE — Telephone Encounter (Signed)
My recommendation is physical therapy which has been recommended postop for her and she refused.  She cannot safely take any controlled substances.

## 2016-02-16 NOTE — Telephone Encounter (Signed)
She had the orders, but refused the therapy each time they visited.

## 2016-02-16 NOTE — Telephone Encounter (Signed)
Patient aware of Dr.[name]'s response and states she had physical therapy on several occasions. Patient will keep with her current regimen for back pain

## 2016-02-17 NOTE — Telephone Encounter (Signed)
Ok to fill 

## 2016-02-18 NOTE — Telephone Encounter (Signed)
This may only be filled if [name] picks up the pills and leaves her with only 2 at a time.  She uses them to sedate herself and they are gone in no time if she has a full supply.  I'm not sure he is willing to even be involved with her care any longer since she refused PT and has been back to her old habits of calling for narcotic prescriptions.

## 2016-02-22 ENCOUNTER — Emergency Department (HOSPITAL_COMMUNITY): Payer: Medicare Other

## 2016-02-22 ENCOUNTER — Telehealth: Payer: Self-pay | Admitting: *Deleted

## 2016-02-22 ENCOUNTER — Encounter (HOSPITAL_COMMUNITY): Payer: Self-pay

## 2016-02-22 ENCOUNTER — Other Ambulatory Visit: Payer: Self-pay | Admitting: Internal Medicine

## 2016-02-22 ENCOUNTER — Emergency Department (HOSPITAL_COMMUNITY)
Admission: EM | Admit: 2016-02-22 | Discharge: 2016-02-22 | Disposition: A | Discharge status: Home / Self Care | Payer: Medicare Other | Attending: Emergency Medicine | Admitting: Emergency Medicine

## 2016-02-22 DIAGNOSIS — Y939 Activity, unspecified: Secondary | ICD-10-CM | POA: Insufficient documentation

## 2016-02-22 DIAGNOSIS — Z8673 Personal history of transient ischemic attack (TIA), and cerebral infarction without residual deficits: Secondary | ICD-10-CM | POA: Diagnosis not present

## 2016-02-22 DIAGNOSIS — M545 Low back pain: Secondary | ICD-10-CM | POA: Insufficient documentation

## 2016-02-22 DIAGNOSIS — I10 Essential (primary) hypertension: Secondary | ICD-10-CM | POA: Insufficient documentation

## 2016-02-22 DIAGNOSIS — Y92002 Bathroom of unspecified non-institutional (private) residence single-family (private) house as the place of occurrence of the external cause: Secondary | ICD-10-CM | POA: Insufficient documentation

## 2016-02-22 DIAGNOSIS — Z79899 Other long term (current) drug therapy: Secondary | ICD-10-CM | POA: Diagnosis not present

## 2016-02-22 DIAGNOSIS — W1839XA Other fall on same level, initial encounter: Secondary | ICD-10-CM | POA: Insufficient documentation

## 2016-02-22 DIAGNOSIS — M549 Dorsalgia, unspecified: Secondary | ICD-10-CM

## 2016-02-22 DIAGNOSIS — Y999 Unspecified external cause status: Secondary | ICD-10-CM | POA: Diagnosis not present

## 2016-02-22 DIAGNOSIS — G8929 Other chronic pain: Secondary | ICD-10-CM | POA: Diagnosis not present

## 2016-02-22 MED ORDER — LIDOCAINE 5 % EX PTCH
1.0000 | MEDICATED_PATCH | CUTANEOUS | Status: DC
  Administered 2016-02-22: 1 via TRANSDERMAL
  Filled 2016-02-22: qty 1

## 2016-02-22 MED ORDER — KETOROLAC TROMETHAMINE 60 MG/2ML IM SOLN
60.0000 mg | Freq: Once | INTRAMUSCULAR | Status: AC
  Administered 2016-02-22: 60 mg via INTRAMUSCULAR
  Filled 2016-02-22: qty 2

## 2016-02-22 MED ORDER — DICLOFENAC SODIUM ER 100 MG PO TB24: 100.0000 mg | ORAL_TABLET | Freq: Every day | ORAL | 0 refills | Status: DC

## 2016-02-22 MED ORDER — METHOCARBAMOL 500 MG PO TABS: 500.0000 mg | ORAL_TABLET | Freq: Two times a day (BID) | ORAL | 0 refills | Status: DC

## 2016-02-22 MED ORDER — LIDOCAINE 5 % EX PTCH: 1.0000 | MEDICATED_PATCH | CUTANEOUS | 0 refills | Status: DC

## 2016-02-22 MED ORDER — METHOCARBAMOL 500 MG PO TABS
1000.0000 mg | ORAL_TABLET | Freq: Once | ORAL | Status: AC
  Administered 2016-02-22: 1000 mg via ORAL
  Filled 2016-02-22: qty 2

## 2016-02-22 NOTE — ED Notes (Signed)
Pt ambulatory and independent at discharge.  Verbalized understanding of discharge instructions 

## 2016-02-22 NOTE — ED Notes (Signed)
Patient transported to X-ray 

## 2016-02-22 NOTE — ED Provider Notes (Signed)
[facility] DEPT Provider Note   CSN: [id number] Arrival date & time: 02/22/16  0434     History   Chief Complaint Chief Complaint  Patient presents with  . Back Pain    HPI [name] is a 75 y.o. female.  The history is provided by the patient. No language interpreter was used.  Back Pain   This is a chronic problem. The current episode started more than 1 week ago. The problem occurs constantly. The problem has been gradually worsening. The pain is associated with falling. The pain is present in the lumbar spine. The quality of the pain is described as stabbing. The pain does not radiate. The pain is severe. The pain is the same all the time. Pertinent negatives include no chest pain, no fever, no numbness, no weight loss, no headaches, no abdominal pain, no abdominal swelling, no bowel incontinence, no perianal numbness, no bladder incontinence, no dysuria, no pelvic pain, no leg pain, no paresthesias, no paresis, no tingling and no weakness. She has tried nothing for the symptoms. The treatment provided no relief. Risk factors include menopause.    Past Medical History:  Diagnosis Date  . Anxiety   . Arthritis    djd  . Cataracts, bilateral    immature   . Cellulitis    10/11/11 hospitalized for cellulitis   . Chronic back pain    scoliosis/stenosis  . Depression    takes Zoloft daily  . Depression   . Dysrhythmia    hx tachy palpitations  . Elbow fracture, left [facility]chetta 2015  . GERD (gastroesophageal reflux disease)    hx pud '98  . Headache(784.0)    migraines yrs ago  . History of bronchitis    many yrs ago  . Hyperlipemia    takes Atorvastatin daily  . Hypertension    takes HCTZ daily  . Insomnia   . Joint pain   . Joint swelling   . Neuromuscular disorder ([place])    peripheral neuropathy, FEET AND LEGS  . Peripheral neuropathy ([place])   . Peripheral neuropathy (HCC)    takes Gabapentin daily  . 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  Radiculopathy, lumbar region   . Restless leg syndrome    takes Sinemet daily  . Stroke [facility])    ? 6 months ago - tests okay - [facility]   . Vertigo     Patient Active Problem List   Diagnosis Date Noted  . Herniated nucleus pulposus, L2-3 right 12/19/2015  . Right foot pain 07/22/2015  . Chest pain 07/11/2015  . History of drug overdose 07/07/2015  . Frequent falls 07/07/2015  . Hyperlipidemia 07/07/2015  . Essential hypertension 07/07/2015  . Spinal stenosis, lumbar region, with neurogenic claudication 07/07/2015  . Severe episode of recurrent major depressive disorder, with psychotic features ([place]) 07/07/2015  . Visual hallucination 07/01/2015  . Delirium   . Severe episode of recurrent major depressive disorder, without psychotic features ([place])   . Back pain 06/20/2015  . MDD (major depressive disorder), recurrent episode, severe ([place]) 06/16/2015  . Lumbar stenosis 04/19/2015  . Hypokalemia 09/19/2014  . Constipation 09/19/2014  . Acute encephalopathy 09/18/2014  . Acute respiratory failure ([place]) 09/17/2014  . OSA (obstructive sleep apnea) 09/17/2014  . Ankle fracture, lateral malleolus, closed 09/14/2014  . Major depressive disorder, recurrent severe without psychotic features ([place]) 08/07/2014  . Insomnia 08/05/2014  . Hip joint painful on movement 08/05/2014  . Degenerative disc disease,  lumbar 08/05/2014  . Severe obesity (BMI >= 40) ([place]) 01/11/2014  . Depression   . Arthritis   . Polyneuropathy in other diseases classified elsewhere ([place]) 04/08/2013  . Restless legs syndrome (RLS) 04/08/2013  . UTI (lower urinary tract infection) 10/14/2011  . Dizziness 10/12/2011  . Fall 10/12/2011  . TIA (transient ischemic attack) 10/12/2011  . Cellulitis 10/12/2011  . GERD (gastroesophageal reflux disease) 10/12/2011    Past Surgical History:  Procedure Laterality Date  . ABDOMINAL HYSTERECTOMY  1975   bil oophorectomy  . BACK SURGERY   MANY YRS AGO   laminectomy x3  .  CARPAL TUNNEL RELEASE  07/09/2012   Procedure: CARPAL TUNNEL RELEASE;  Surgeon: Magnus Sinning, MD;  Location: [facility];  Service: Orthopedics;  Laterality: Left;  . cervical disc surgery x2  2011  . CHOLECYSTECTOMY  1993  . colonosocpy    . ESOPHAGOGASTRODUODENOSCOPY    . FINGER ARTHROPLASTY  07/09/2012   Procedure: FINGER ARTHROPLASTY;  Surgeon: Magnus Sinning, MD;  Location: [facility];  Service: Orthopedics;  Laterality: Left;  Interposition Arthroplasty [facility] Joint Thumb Left   . HEMATOMA EVACUATION  2011   s/p rt cea  . JOINT REPLACEMENT  '01   total knee replacement, LEFT  . left knee arthroscopy  2001  . LIPOMA EXCISION  07/09/2012   Procedure: EXCISION LIPOMA;  Surgeon: Magnus Sinning, MD;  Location: [facility];  Service: Orthopedics;  Laterality: Left;  Excision Lipoma Dorsum Left Wrist   . LUMBAR LAMINECTOMY WITH COFLEX 1 LEVEL Bilateral 04/19/2015   Procedure: LUMBAR TWO-THREE LUMBAR LAMINECTOMY WITH COFLEX ;  Surgeon: Kristeen Miss, MD;  Location: [facility];  Service: Neurosurgery;  Laterality: Bilateral;  Bilateral L23 laminectomy and foraminotomy with coflex  . LUMBAR LAMINECTOMY/DECOMPRESSION MICRODISCECTOMY Right 12/19/2015   Procedure: Right Lumbar two-three  Microdiskectomy;  Surgeon: Kristeen Miss, MD;  Location: [facility];  Service: Neurosurgery;  Laterality: Right;  . ORIF ANKLE FRACTURE Right 09/14/2014   FIBULA   . ORIF ANKLE FRACTURE Right 09/14/2014   Procedure: OPEN REDUCTION INTERNAL FIXATION (ORIF) RIGHT ANKLE FRACTURE/SYNDESMOSIS ;  Surgeon: Wylene Simmer, MD;  Location: [facility];  Service: Orthopedics;  Laterality: Right;  . right knee replacement     09/2011   . rt carotid enarterectomy  2011  . rt knee arthroscopy  '99  . TOTAL KNEE ARTHROPLASTY  09/13/2011   Procedure: TOTAL KNEE ARTHROPLASTY;  Surgeon: Magnus Sinning, MD;  Location: [facility];  Service: Orthopedics;  Laterality: Right;    OB History    No data available       Home Medications    Prior to  Admission medications   Medication Sig Start Date End Date Taking? Authorizing Provider  atorvastatin (LIPITOR) 40 MG tablet Take 40 mg by mouth daily.    Historical Provider, MD  cephALEXin (KEFLEX) 500 MG capsule Take 1 capsule (500 mg total) by mouth 4 (four) times daily. 02/02/16   Faiza Bansal, MD  cholecalciferol (VITAMIN D) 1000 units tablet Take 1,000 Units by mouth daily.    Historical Provider, MD  cyclobenzaprine (FLEXERIL) 10 MG tablet Take 10 mg by mouth 3 (three) times daily as needed for muscle spasms.     Historical Provider, MD  docusate sodium (COLACE) 100 MG capsule Take 100 mg by mouth daily as needed for mild constipation.    Historical Provider, MD  ferrous sulfate 325 (65 FE) MG EC tablet Take 325 mg by mouth 2 (two) times daily.  Historical Provider, MD  gabapentin (NEURONTIN) 400 MG capsule Take 400 mg by mouth 4 (four) times daily.    Historical Provider, MD  lisinopril (PRINIVIL,ZESTRIL) 5 MG tablet Take 5 mg by mouth daily.    Historical Provider, MD  metoprolol (LOPRESSOR) 50 MG tablet Take 50 mg by mouth 2 (two) times daily.    Historical Provider, MD  naproxen sodium (ANAPROX) 220 MG tablet Take 220 mg by mouth 2 (two) times daily as needed (for pain).     Historical Provider, MD  pantoprazole (PROTONIX) 40 MG tablet Take 40 mg by mouth daily.     Historical Provider, MD  pyridOXINE (VITAMIN B-6) 50 MG tablet Take 50 mg by mouth daily.    Historical Provider, MD  sertraline (ZOLOFT) 50 MG tablet Take 2 tablets (100 mg total) by mouth daily. 07/07/15   [name] L [name], DO  sodium chloride (OCEAN) 0.65 % SOLN nasal spray Place 1 spray into both nostrils as needed for congestion. 06/28/15   Encarnacion Slates, NP    Family History Family History  Problem Relation Age of Onset  . Congestive Heart Failure Father   . Congestive Heart Failure Sister   . Alzheimer's disease Mother   . Diabetes Brother   . Arthritis Brother     Social History Social History  Substance  Use Topics  . Smoking status: Never Smoker  . Smokeless tobacco: Never Used  . Alcohol use No     Allergies   Contrast media [iodinated diagnostic agents]; Acyclovir and related; and Sulfa drugs cross reactors   Review of Systems Review of Systems  Constitutional: Negative for fever and weight loss.  Cardiovascular: Negative for chest pain.  Gastrointestinal: Negative for abdominal pain and bowel incontinence.  Genitourinary: Negative for bladder incontinence, dysuria and pelvic pain.  Musculoskeletal: Positive for back pain. Negative for gait problem, neck pain and neck stiffness.  Neurological: Negative for tingling, weakness, numbness, headaches and paresthesias.  All other systems reviewed and are negative.    Physical Exam Updated Vital Signs BP 152/94 (BP Location: Left Arm)   Pulse 83   Temp 97.8 F (36.6 C) (Oral)   Resp 18   Ht 5\' 5"  (1.651 m)   Wt 173 lb (78.5 kg)   SpO2 97%   BMI 28.79 kg/m   Physical Exam  Constitutional: [name] oriented to person, place, and time. She appears well-developed and well-nourished. No distress.  HENT:  Head: Normocephalic and atraumatic.  Mouth/Throat: No oropharyngeal exudate.  Eyes: EOM are normal. Pupils are equal, round, and reactive to light.  Neck: Normal range of motion. Neck supple.  Cardiovascular: Normal rate and regular rhythm.   Pulmonary/Chest: Effort normal and breath sounds normal. No respiratory distress. She has no wheezes. She has no rales.  Abdominal: Soft. Bowel sounds are normal.  Musculoskeletal: Normal range of motion.  Neurological: [name] alert and oriented to person, place, and time. She has normal reflexes.  Skin: Skin is warm and dry. Capillary refill takes less than 2 seconds.  Psychiatric: She has a normal mood and affect.     ED Treatments / Results  Labs (all labs ordered are listed, but only abnormal results are displayed) Labs Reviewed - No data to display  EKG  EKG  Interpretation None       Radiology Dg Lumbar Spine Complete  Result Date: 02/22/2016 CLINICAL DATA:  [place] yesterday. Low back pain into the left hip. Prior back surgeries. EXAM: LUMBAR SPINE - COMPLETE 4+ VIEW  COMPARISON:  01/12/2016 FINDINGS: Postoperative changes with fixation hardware at the spinous processes at L2-3. Bone graft fusion from L4 to the sacrum. Slight anterior subluxation of L4 on L5 appears unchanged since prior study. Degenerative changes with disc space narrowing, sclerosis, hypertrophic change, and vacuum phenomenon demonstrated predominantly at L2-3 and L5-S1 levels. No acute vertebral compression deformities. No destructive bone lesions. Aortic atherosclerosis. Surgical clips in the right upper quadrant. Visualize sacrum appears intact. IMPRESSION: Postoperative and degenerative changes in the lumbar spine without significant change since prior study. Electronically Signed   By: Lucienne Capers M.D.   On: 02/22/2016 05:50   Vitals:   02/22/16 0452  BP: 152/94  Pulse: 83  Resp: 18  Temp: 97.8 F (36.6 C)    Procedures Procedures (including critical care time)  Medications Ordered in ED Medications  lidocaine (LIDODERM) 5 % 1 patch (1 patch Transdermal Patch Applied 02/22/16 0543)  ketorolac (TORADOL) injection 60 mg (60 mg Intramuscular Given 02/22/16 0546)  methocarbamol (ROBAXIN) tablet 1,000 mg (1,000 mg Oral Given 02/22/16 0545)     Initial Impression / Assessment and Plan / ED Course  I have reviewed the triage vital signs and the nursing notes.  Pertinent labs & imaging results that were available during my care of the patient were reviewed by me and considered in my medical decision making (see chart for details).  Clinical Course   Will d/c home with NSAIDs and lidoderm patches.  To be seen by Dr. Ellene Route this week    Final Clinical Impressions(s) / ED Diagnoses   Final diagnoses:  None    New Prescriptions New Prescriptions   No medications  on file   All questions answered to patient's satisfaction. Based on history and exam patient has been appropriately medically screened and emergency conditions excluded. Patient is stable for discharge at this time. Follow up with your PMD for recheck in 2 days and strict return precautions given     Raylee Adamec, MD 02/22/16 [id number]

## 2016-02-22 NOTE — Telephone Encounter (Signed)
Pt called regarding insurance not covering her Rx.  EDCM researched the chart and spoke with [place] who had taken care of situation by obtaining an alternate Rx.  Advised Pt that issue has been resolved and she may call her pharmacy to confirm when she can pick up Rx.  Pt very appreciative.

## 2016-02-22 NOTE — ED Triage Notes (Signed)
Pt complains of chronic back pain and last night she fell on hard bathroom tile and made the pain worse.

## 2016-02-23 ENCOUNTER — Telehealth: Payer: Self-pay

## 2016-02-23 NOTE — Telephone Encounter (Signed)
Noted.  I guess he'll be coming along.

## 2016-02-23 NOTE — Telephone Encounter (Signed)
Message left on triage voicemail by patient's son. Patient cancelled 2 doctor appointments this week (FYI). Patient's son is requesting that we not cancel appointment for Monday with Dr.[name] if patient calls. Administrative and clinical staff notified.

## 2016-02-25 ENCOUNTER — Encounter (HOSPITAL_COMMUNITY): Payer: Self-pay | Admitting: *Deleted

## 2016-02-25 ENCOUNTER — Emergency Department (HOSPITAL_COMMUNITY)
Admission: EM | Admit: 2016-02-25 | Discharge: 2016-02-25 | Disposition: A | Discharge status: Home / Self Care | Payer: Medicare Other | Attending: Emergency Medicine | Admitting: Emergency Medicine

## 2016-02-25 DIAGNOSIS — M549 Dorsalgia, unspecified: Secondary | ICD-10-CM | POA: Insufficient documentation

## 2016-02-25 DIAGNOSIS — I10 Essential (primary) hypertension: Secondary | ICD-10-CM | POA: Insufficient documentation

## 2016-02-25 DIAGNOSIS — Z8673 Personal history of transient ischemic attack (TIA), and cerebral infarction without residual deficits: Secondary | ICD-10-CM | POA: Diagnosis not present

## 2016-02-25 DIAGNOSIS — G8929 Other chronic pain: Secondary | ICD-10-CM | POA: Diagnosis not present

## 2016-02-25 DIAGNOSIS — Z96653 Presence of artificial knee joint, bilateral: Secondary | ICD-10-CM | POA: Diagnosis not present

## 2016-02-25 MED ORDER — HYDROCODONE-ACETAMINOPHEN 5-325 MG PO TABS
2.0000 | ORAL_TABLET | Freq: Once | ORAL | Status: AC
  Administered 2016-02-25: 2 via ORAL
  Filled 2016-02-25: qty 2

## 2016-02-25 NOTE — ED Provider Notes (Signed)
[facility] DEPT Provider Note   CSN: [id number] Arrival date & time: 02/25/16  0507     History   Chief Complaint Chief Complaint  Patient presents with  . Back Pain    HPI [name] is a 75 y.o. female past history of chronic back pain, narcotic drug abuse, presenting today with worsening back pain. Patient states she's having worsening left-sided back pain that radiates into her gluteal area. She denies any radiation down her leg or numbness of the legs. She has no incontinence. She states has a history of multiple back surgeries and is set to see her neurosurgeon next week. She has been taking over-the-counter pain medications without any relief. She states trauma Norco worked better for her. She denies any fevers, weight loss, or neurological symptoms. She states this is consistent with her normal chronic back pain. There are no further complaints.  10 Systems reviewed and are negative for acute change except as noted in the HPI.    HPI  Past Medical History:  Diagnosis Date  . Anxiety   . Arthritis    djd  . Cataracts, bilateral    immature   . Cellulitis    10/11/11 hospitalized for cellulitis   . Chronic back pain    scoliosis/stenosis  . Depression    takes Zoloft daily  . Depression   . Dysrhythmia    hx tachy palpitations  . Elbow fracture, left April 2015  . GERD (gastroesophageal reflux disease)    hx pud '98  . Headache(784.0)    migraines yrs ago  . History of bronchitis    many yrs ago  . Hyperlipemia    takes Atorvastatin daily  . Hypertension    takes HCTZ daily  . Insomnia   . Joint pain   . Joint swelling   . Neuromuscular disorder ([place])    peripheral neuropathy, FEET AND LEGS  . Peripheral neuropathy ([place])   . Peripheral neuropathy (HCC)    takes Gabapentin daily  . 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 Radiculopathy, lumbar region   . Restless leg syndrome    takes Sinemet daily  . Stroke [facility])    ? 6 months ago  - tests okay - [facility]   . Vertigo     Patient Active Problem List   Diagnosis Date Noted  . Herniated nucleus pulposus, L2-3 right 12/19/2015  . Right foot pain 07/22/2015  . Chest pain 07/11/2015  . History of drug overdose 07/07/2015  . Frequent falls 07/07/2015  . Hyperlipidemia 07/07/2015  . Essential hypertension 07/07/2015  . Spinal stenosis, lumbar region, with neurogenic claudication 07/07/2015  . Severe episode of recurrent major depressive disorder, with psychotic features ([place]) 07/07/2015  . Visual hallucination 07/01/2015  . Delirium   . Severe episode of recurrent major depressive disorder, without psychotic features ([place])   . Back pain 06/20/2015  . MDD (major depressive disorder), recurrent episode, severe ([place]) 06/16/2015  . Lumbar stenosis 04/19/2015  . Hypokalemia 09/19/2014  . Constipation 09/19/2014  . Acute encephalopathy 09/18/2014  . Acute respiratory failure ([place]) 09/17/2014  . OSA (obstructive sleep apnea) 09/17/2014  . Ankle fracture, lateral malleolus, closed 09/14/2014  . Major depressive disorder, recurrent severe without psychotic features ([place]) 08/07/2014  . Insomnia 08/05/2014  . Hip joint painful on movement 08/05/2014  . Degenerative disc disease, lumbar 08/05/2014  . Severe obesity (BMI >= 40) ([place]) 01/11/2014  . Depression   . Arthritis   .  Polyneuropathy in other diseases classified elsewhere ([place]) 04/08/2013  . Restless legs syndrome (RLS) 04/08/2013  . UTI (lower urinary tract infection) 10/14/2011  . Dizziness 10/12/2011  . Fall 10/12/2011  . TIA (transient ischemic attack) 10/12/2011  . Cellulitis 10/12/2011  . GERD (gastroesophageal reflux disease) 10/12/2011    Past Surgical History:  Procedure Laterality Date  . ABDOMINAL HYSTERECTOMY  1975   bil oophorectomy  . BACK SURGERY   MANY YRS AGO   laminectomy x3  . CARPAL TUNNEL RELEASE  07/09/2012   Procedure: CARPAL TUNNEL RELEASE;  Surgeon: Magnus Sinning, MD;  Location: [facility]  ORS;  Service: Orthopedics;  Laterality: Left;  . cervical disc surgery x2  2011  . CHOLECYSTECTOMY  1993  . colonosocpy    . ESOPHAGOGASTRODUODENOSCOPY    . FINGER ARTHROPLASTY  07/09/2012   Procedure: FINGER ARTHROPLASTY;  Surgeon: Magnus Sinning, MD;  Location: [facility] ORS;  Service: Orthopedics;  Laterality: Left;  Interposition Arthroplasty [facility] Joint Thumb Left   . HEMATOMA EVACUATION  2011   s/p rt cea  . JOINT REPLACEMENT  '01   total knee replacement, LEFT  . left knee arthroscopy  2001  . LIPOMA EXCISION  07/09/2012   Procedure: EXCISION LIPOMA;  Surgeon: Magnus Sinning, MD;  Location: [facility] ORS;  Service: Orthopedics;  Laterality: Left;  Excision Lipoma Dorsum Left Wrist   . LUMBAR LAMINECTOMY WITH COFLEX 1 LEVEL Bilateral 04/19/2015   Procedure: LUMBAR TWO-THREE LUMBAR LAMINECTOMY WITH COFLEX ;  Surgeon: Kristeen Miss, MD;  Location: [facility]S;  Service: Neurosurgery;  Laterality: Bilateral;  Bilateral L23 laminectomy and foraminotomy with coflex  . LUMBAR LAMINECTOMY/DECOMPRESSION MICRODISCECTOMY Right 12/19/2015   Procedure: Right Lumbar two-three  Microdiskectomy;  Surgeon: Kristeen Miss, MD;  Location: [facility] ORS;  Service: Neurosurgery;  Laterality: Right;  . ORIF ANKLE FRACTURE Right 09/14/2014   FIBULA   . ORIF ANKLE FRACTURE Right 09/14/2014   Procedure: OPEN REDUCTION INTERNAL FIXATION (ORIF) RIGHT ANKLE FRACTURE/SYNDESMOSIS ;  Surgeon: Wylene Simmer, MD;  Location: [facility];  Service: Orthopedics;  Laterality: Right;  . right knee replacement     09/2011   . rt carotid enarterectomy  2011  . rt knee arthroscopy  '99  . TOTAL KNEE ARTHROPLASTY  09/13/2011   Procedure: TOTAL KNEE ARTHROPLASTY;  Surgeon: Magnus Sinning, MD;  Location: [facility] ORS;  Service: Orthopedics;  Laterality: Right;    OB History    No data available       Home Medications    Prior to Admission medications   Medication Sig Start Date End Date Taking? Authorizing Provider  atorvastatin (LIPITOR) 40  MG tablet Take 40 mg by mouth daily.    Historical Provider, MD  cephALEXin (KEFLEX) 500 MG capsule Take 1 capsule (500 mg total) by mouth 4 (four) times daily. 02/02/16   April Palumbo, MD  cholecalciferol (VITAMIN D) 1000 units tablet Take 1,000 Units by mouth daily.    Historical Provider, MD  cyclobenzaprine (FLEXERIL) 10 MG tablet Take 10 mg by mouth 3 (three) times daily as needed for muscle spasms.     Historical Provider, MD  Diclofenac Sodium CR (VOLTAREN-XR) 100 MG 24 hr tablet Take 1 tablet (100 mg total) by mouth daily. 02/22/16   April Palumbo, MD  docusate sodium (COLACE) 100 MG capsule Take 100 mg by mouth daily as needed for mild constipation.    Historical Provider, MD  ferrous sulfate 325 (65 FE) MG EC tablet Take 325 mg by mouth 2 (two) times  daily.     Historical Provider, MD  gabapentin (NEURONTIN) 400 MG capsule Take 400 mg by mouth 4 (four) times daily.    Historical Provider, MD  lidocaine (LIDODERM) 5 % Place 1 patch onto the skin daily. Remove & Discard patch within 12 hours or as directed by MD 02/22/16   April Palumbo, MD  lisinopril (PRINIVIL,ZESTRIL) 5 MG tablet Take 5 mg by mouth daily.    Historical Provider, MD  methocarbamol (ROBAXIN) 500 MG tablet Take 1 tablet (500 mg total) by mouth 2 (two) times daily. 02/22/16   April Palumbo, MD  metoprolol (LOPRESSOR) 50 MG tablet Take 50 mg by mouth 2 (two) times daily.    Historical Provider, MD  naproxen sodium (ANAPROX) 220 MG tablet Take 220 mg by mouth 2 (two) times daily as needed (for pain).     Historical Provider, MD  pantoprazole (PROTONIX) 40 MG tablet Take 40 mg by mouth daily.     Historical Provider, MD  pyridOXINE (VITAMIN B-6) 50 MG tablet Take 50 mg by mouth daily.    Historical Provider, MD  sertraline (ZOLOFT) 50 MG tablet TAKE TWO TABLETS BY MOUTH  ONCE DAILY FOR DEPRESSION 02/23/16   [name] L [name], DO  sodium chloride (OCEAN) 0.65 % SOLN nasal spray Place 1 spray into both nostrils as needed for congestion.  06/28/15   Encarnacion Slates, NP    Family History Family History  Problem Relation Age of Onset  . Congestive Heart Failure Father   . Congestive Heart Failure Sister   . Alzheimer's disease Mother   . Diabetes Brother   . Arthritis Brother     Social History Social History  Substance Use Topics  . Smoking status: Never Smoker  . Smokeless tobacco: Never Used  . Alcohol use No     Allergies   Contrast media [iodinated diagnostic agents]; Acyclovir and related; and Sulfa drugs cross reactors   Review of Systems Review of Systems   Physical Exam Updated Vital Signs BP (!) 169/102   Pulse 88   Temp 98.2 F (36.8 C) (Oral)   Resp 15   Ht 5\' 5"  (1.651 m)   Wt 173 lb (78.5 kg)   SpO2 99%   BMI 28.79 kg/m   Physical Exam  Constitutional: [name] oriented to person, place, and time. She appears well-developed and well-nourished. No distress.  HENT:  Head: Normocephalic and atraumatic.  Nose: Nose normal.  Mouth/Throat: Oropharynx is clear and moist. No oropharyngeal exudate.  Eyes: Conjunctivae and EOM are normal. Pupils are equal, round, and reactive to light. No scleral icterus.  Neck: Normal range of motion. Neck supple. No JVD present. No tracheal deviation present. No thyromegaly present.  Cardiovascular: Normal rate, regular rhythm and normal heart sounds.  Exam reveals no gallop and no friction rub.   No murmur heard. Pulmonary/Chest: Effort normal and breath sounds normal. No respiratory distress. She has no wheezes. She exhibits no tenderness.  Abdominal: Soft. Bowel sounds are normal. She exhibits no distension and no mass. There is no tenderness. There is no rebound and no guarding.  Musculoskeletal: Normal range of motion. She exhibits no edema or tenderness.  Lymphadenopathy:    She has no cervical adenopathy.  Neurological: [name] alert and oriented to person, place, and time. No cranial nerve deficit. She exhibits normal muscle tone.  Normal strength and  sensation in all extremities. Normal cerebellar testing.  Skin: Skin is warm and dry. No rash noted. No erythema. No pallor.  Nursing note and vitals reviewed.    ED Treatments / Results  Labs (all labs ordered are listed, but only abnormal results are displayed) Labs Reviewed - No data to display  EKG  EKG Interpretation None       Radiology No results found.  Procedures Procedures (including critical care time)  Medications Ordered in ED Medications  HYDROcodone-acetaminophen (NORCO/VICODIN) 5-325 MG per tablet 2 tablet (2 tablets Oral Given 02/25/16 0533)     Initial Impression / Assessment and Plan / ED Course  I have reviewed the triage vital signs and the nursing notes.  Pertinent labs & imaging results that were available during my care of the patient were reviewed by me and considered in my medical decision making (see chart for details).  Clinical Course    Patient presents to the emergency department for acute on chronic back pain. Fortunately, prior to review of her previous records she was given 2 Norco tablets for her pain. Patient does have a history of drug-seeking behavior and going to multiple physicians to get narcotic pain medication. She will not be given a prescription for narcotic pain medications. She was advised on over-the-counter pain medications, and Lidoderm patches to use at home until she can follow-up with her neurosurgeon. She still wishes good understanding. Neurological exam is normal, there are no red flag symptoms for her back pain. She appears well and in no acute distress, vital signs were within her normal limits and she is safe for discharge.   Final Clinical Impressions(s) / ED Diagnoses   Final diagnoses:  None    New Prescriptions New Prescriptions   No medications on file     Everlene Balls, MD 02/25/16 [phone]

## 2016-02-25 NOTE — ED Notes (Signed)
Pt given discharge instructions and pt expressed understanding.  Pt asked about a prescription for pain medication in which Dr. Claudine Mouton was consulted.  Dr. Claudine Mouton stated that pt will not be given a prescription and that she should follow up with her MD.

## 2016-02-25 NOTE — ED Notes (Signed)
Dr. Oni at bedside. 

## 2016-02-25 NOTE — ED Notes (Addendum)
Pt informed that she should wait at least 3 hours before driving or she should arrange for someone to pick her up.  Pt expressed understanding.  It was also explained to pt that there could be a potential for her to get into an accident if she drove or she could be arrested for driving under the influence.  Pt expressed understanding and still desired to be discharged.  Pt was taken to the lobby by wheelchair. No reaction to medication noted at time of discharge.

## 2016-02-25 NOTE — ED Triage Notes (Signed)
Per pt report: pt c/o back pain x several weeks.  Pt reports having back surgery on July 17 with Dr. Ellene Route.  Pt denies any trauma.  Pt reports severe back pain that radiates to the left hip.  Pt reports driving herself here and also being ambulatory.  A/o x 4.  Skin warm and dry.

## 2016-02-27 ENCOUNTER — Telehealth: Payer: Self-pay | Admitting: *Deleted

## 2016-02-27 ENCOUNTER — Encounter: Payer: Self-pay | Admitting: Internal Medicine

## 2016-02-27 ENCOUNTER — Ambulatory Visit (INDEPENDENT_AMBULATORY_CARE_PROVIDER_SITE_OTHER): Payer: Medicare Other | Admitting: Internal Medicine

## 2016-02-27 VITALS — BP 150/90 | HR 83 | Temp 98.7°F | Wt 187.0 lb

## 2016-02-27 DIAGNOSIS — K2211 Ulcer of esophagus with bleeding: Secondary | ICD-10-CM

## 2016-02-27 DIAGNOSIS — K264 Chronic or unspecified duodenal ulcer with hemorrhage: Secondary | ICD-10-CM | POA: Diagnosis not present

## 2016-02-27 DIAGNOSIS — F1120 Opioid dependence, uncomplicated: Secondary | ICD-10-CM

## 2016-02-27 DIAGNOSIS — M79671 Pain in right foot: Secondary | ICD-10-CM

## 2016-02-27 DIAGNOSIS — G63 Polyneuropathy in diseases classified elsewhere: Secondary | ICD-10-CM | POA: Diagnosis not present

## 2016-02-27 DIAGNOSIS — I10 Essential (primary) hypertension: Secondary | ICD-10-CM | POA: Diagnosis not present

## 2016-02-27 LAB — CBC WITH DIFFERENTIAL/PLATELET
Basophils Absolute: 65 cells/uL (ref 0–200)
Basophils Relative: 1 %
Eosinophils Absolute: 325 cells/uL (ref 15–500)
Eosinophils Relative: 5 %
HCT: 37.1 % (ref 35.0–45.0)
Hemoglobin: 11.7 g/dL (ref 11.7–15.5)
Lymphocytes Relative: 35 %
Lymphs Abs: 2275 cells/uL (ref 850–3900)
MCH: 30.5 pg (ref 27.0–33.0)
MCHC: 31.5 g/dL — ABNORMAL LOW (ref 32.0–36.0)
MCV: 96.9 fL (ref 80.0–100.0)
MPV: 9 fL (ref 7.5–12.5)
Monocytes Absolute: 585 cells/uL (ref 200–950)
Monocytes Relative: 9 %
Neutro Abs: 3250 cells/uL (ref 1500–7800)
Neutrophils Relative %: 50 %
Platelets: 267 10*3/uL (ref 140–400)
RBC: 3.83 MIL/uL (ref 3.80–5.10)
RDW: 20 % — ABNORMAL HIGH (ref 11.0–15.0)
WBC: 6.5 10*3/uL (ref 3.8–10.8)

## 2016-02-27 LAB — BASIC METABOLIC PANEL
BUN: 20 mg/dL (ref 7–25)
CO2: 22 mmol/L (ref 20–31)
Calcium: 9.5 mg/dL (ref 8.6–10.4)
Chloride: 104 mmol/L (ref 98–110)
Creat: 0.79 mg/dL (ref 0.60–0.93)
Glucose, Bld: 103 mg/dL — ABNORMAL HIGH (ref 65–99)
Potassium: 4.9 mmol/L (ref 3.5–5.3)
Sodium: 135 mmol/L (ref 135–146)

## 2016-02-27 MED ORDER — PANTOPRAZOLE SODIUM 40 MG PO TBEC: 40.0000 mg | DELAYED_RELEASE_TABLET | Freq: Every day | ORAL | 3 refills | Status: DC

## 2016-02-27 MED ORDER — FERROUS SULFATE 325 (65 FE) MG PO TBEC: 325.0000 mg | DELAYED_RELEASE_TABLET | Freq: Two times a day (BID) | ORAL | 3 refills | Status: DC

## 2016-02-27 MED ORDER — SERTRALINE HCL 50 MG PO TABS: ORAL_TABLET | ORAL | 3 refills | Status: DC

## 2016-02-27 NOTE — Telephone Encounter (Signed)
Spoke with patient and advised results he understands the medication she should be stopping, he will encourage her to get the mammogram and do the stool test.

## 2016-02-27 NOTE — Progress Notes (Signed)
Location:  [facility] clinic Provider:  Janifer Gieselman L. Mariea Clonts, D.O., C.M.D.  Code Status: DNR Goals of Care:  Advanced Directives 02/25/2016  Does patient have an advance directive? Yes  Type of Advance Directive Healthcare Power of Attorney  Does patient want to make changes to advanced directive? -  Copy of advanced directive(s) in chart? -  Would patient like information on creating an advanced directive? -  Pre-existing out of facility DNR order (yellow form or pink MOST form) -  Some encounter information is confidential and restricted. Go to Review Flowsheets activity to see all data.     Chief Complaint  Patient presents with  . Medical Management of Chronic Issues    1 mth follow-up    HPI: Patient is a 75 y.o. female seen today for medical management of chronic diseases.    Says she will get her mammogram.    [name] still having dark stools.  Has just had recent GI bleed.  Says she sees GI 10/2 in f/u.  Is taking iron also. ED physician put her on two nsaids and two muscle relaxers.     Says she feels pretty good today.  Uses the tens unit for her foot, but it doesn't help her back.  [name] using salonpas spray which does bring it to a tolerable level.  She went to the ED again 9/23 after neurosurgery office sent her by phone.  [name] now on flexeril and voltaren and robaxin and naproxen.  Also remains on   She was treated for a UTI with keflex.  Felt better after abx completed.  Past Medical History:  Diagnosis Date  . Anxiety   . Arthritis    djd  . Cataracts, bilateral    immature   . Cellulitis    10/11/11 hospitalized for cellulitis   . Chronic back pain    scoliosis/stenosis  . Depression    takes Zoloft daily  . Depression   . Dysrhythmia    hx tachy palpitations  . Elbow fracture, left April 2015  . GERD (gastroesophageal reflux disease)    hx pud '98  . Headache(784.0)    migraines yrs ago  . History of bronchitis    many yrs ago  . Hyperlipemia    takes  Atorvastatin daily  . Hypertension    takes HCTZ daily  . Insomnia   . Joint pain   . Joint swelling   . Neuromuscular disorder ([place])    peripheral neuropathy, FEET AND LEGS  . Peripheral neuropathy ([place])   . Peripheral neuropathy (HCC)    takes Gabapentin daily  . 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 Radiculopathy, lumbar region   . Restless leg syndrome    takes Sinemet daily  . Stroke [facility])    ? 6 months ago - tests okay - [facility]   . Vertigo     Past Surgical History:  Procedure Laterality Date  . ABDOMINAL HYSTERECTOMY  1975   bil oophorectomy  . BACK SURGERY   MANY YRS AGO   laminectomy x3  . CARPAL TUNNEL RELEASE  07/09/2012   Procedure: CARPAL TUNNEL RELEASE;  Surgeon: Magnus Sinning, MD;  Location: [facility];  Service: Orthopedics;  Laterality: Left;  . cervical disc surgery x2  2011  . CHOLECYSTECTOMY  1993  . colonosocpy    . ESOPHAGOGASTRODUODENOSCOPY    . FINGER ARTHROPLASTY  07/09/2012   Procedure: FINGER ARTHROPLASTY;  Surgeon: Laurice Record A[place]ton,  MD;  Location: [facility];  Service: Orthopedics;  Laterality: Left;  Interposition Arthroplasty CMC Joint Thumb Left   . HEMATOMA EVACUATION  2011   s/p rt cea  . JOINT REPLACEMENT  '01   total knee replacement, LEFT  . left knee arthroscopy  2001  . LIPOMA EXCISION  07/09/2012   Procedure: EXCISION LIPOMA;  Surgeon: Magnus Sinning, MD;  Location: [facility];  Service: Orthopedics;  Laterality: Left;  Excision Lipoma Dorsum Left Wrist   . LUMBAR LAMINECTOMY WITH COFLEX 1 LEVEL Bilateral 04/19/2015   Procedure: LUMBAR TWO-THREE LUMBAR LAMINECTOMY WITH COFLEX ;  Surgeon: Kristeen Miss, MD;  Location: [place] [facility];  Service: Neurosurgery;  Laterality: Bilateral;  Bilateral L23 laminectomy and foraminotomy with coflex  . LUMBAR LAMINECTOMY/DECOMPRESSION MICRODISCECTOMY Right 12/19/2015   Procedure: Right Lumbar two-three  Microdiskectomy;  Surgeon: Kristeen Miss, MD;  Location: [place] [facility];  Service: Neurosurgery;   Laterality: Right;  . ORIF ANKLE FRACTURE Right 09/14/2014   FIBULA   . ORIF ANKLE FRACTURE Right 09/14/2014   Procedure: OPEN REDUCTION INTERNAL FIXATION (ORIF) RIGHT ANKLE FRACTURE/SYNDESMOSIS ;  Surgeon: Wylene Simmer, MD;  Location: [place];  Service: Orthopedics;  Laterality: Right;  . right knee replacement     09/2011   . rt carotid enarterectomy  2011  . rt knee arthroscopy  '99  . TOTAL KNEE ARTHROPLASTY  09/13/2011   Procedure: TOTAL KNEE ARTHROPLASTY;  Surgeon: Magnus Sinning, MD;  Location: [facility];  Service: Orthopedics;  Laterality: Right;    Allergies  Allergen Reactions  . Contrast Media [Iodinated Diagnostic Agents] Anaphylaxis  . Acyclovir And Related Other (See Comments)    Reaction:  Unknown   . Sulfa Drugs Cross Reactors Nausea And Vomiting      Medication List       Accurate as of 02/27/16  2:22 PM. Always use your most recent med list.          atorvastatin 40 MG tablet Commonly known as:  LIPITOR Take 40 mg by mouth daily.   cholecalciferol 1000 units tablet Commonly known as:  VITAMIN D Take 1,000 Units by mouth daily.   cyclobenzaprine 10 MG tablet Commonly known as:  FLEXERIL Take 10 mg by mouth 3 (three) times daily as needed for muscle spasms.   Diclofenac Sodium CR 100 MG 24 hr tablet Commonly known as:  VOLTAREN-XR Take 1 tablet (100 mg total) by mouth daily.   docusate sodium 100 MG capsule Commonly known as:  COLACE Take 100 mg by mouth daily as needed for mild constipation.   ferrous sulfate 325 (65 FE) MG EC tablet Take 325 mg by mouth 2 (two) times daily.   gabapentin 400 MG capsule Commonly known as:  NEURONTIN Take 400 mg by mouth 4 (four) times daily.   lisinopril 5 MG tablet Commonly known as:  PRINIVIL,ZESTRIL Take 5 mg by mouth daily.   methocarbamol 500 MG tablet Commonly known as:  ROBAXIN Take 1 tablet (500 mg total) by mouth 2 (two) times daily.   metoprolol 50 MG tablet Commonly known as:  LOPRESSOR Take 50 mg  by mouth 2 (two) times daily.   naproxen sodium 220 MG tablet Commonly known as:  ANAPROX Take 220 mg by mouth 2 (two) times daily as needed (for pain).   pantoprazole 40 MG tablet Commonly known as:  PROTONIX Take 40 mg by mouth daily.   pyridOXINE 50 MG tablet Commonly known as:  VITAMIN B-6 Take 50 mg by mouth daily.  SALONPAS EX Apply topically as needed.   sertraline 50 MG tablet Commonly known as:  ZOLOFT TAKE TWO TABLETS BY MOUTH  ONCE DAILY FOR DEPRESSION   sodium chloride 0.65 % Soln nasal spray Commonly known as:  OCEAN Place 1 spray into both nostrils as needed for congestion.       Review of Systems:  Review of Systems  Constitutional: Negative for chills, fever and malaise/fatigue.  HENT: Negative for congestion.   Eyes: Negative for blurred vision.       Glasses  Respiratory: Negative for cough and shortness of breath.   Cardiovascular: Negative for chest pain, palpitations and leg swelling.  Gastrointestinal: Negative for abdominal pain, blood in stool, constipation and melena.  Genitourinary: Negative for dysuria, frequency and urgency.  Musculoskeletal: Positive for back pain. Negative for falls, myalgias and neck pain.       Right ankle pain/foot pain  Skin: Negative for itching and rash.  Neurological: Positive for headaches. Negative for dizziness, loss of consciousness and weakness.  Endo/Heme/Allergies: Does not bruise/bleed easily.  Psychiatric/Behavioral: Positive for depression and memory loss.    Health Maintenance  Topic Date Due  . TETANUS/TDAP  01/26/1961  . COLONOSCOPY  01/27/1992  . ZOSTAVAX  01/26/2002  . MAMMOGRAM  12/31/2009  . INFLUENZA VACCINE  Completed  . DEXA SCAN  Completed  . PNA vac Low Risk Adult  Completed    Physical Exam: Vitals:   02/27/16 1350  BP: (!) 150/90  Pulse: 83  Temp: 98.7 F (37.1 C)  TempSrc: Oral  SpO2: 97%  Weight: 187 lb (84.8 kg)   Body mass index is 31.12 kg/m. Physical Exam    Constitutional: [name] oriented to person, place, and time. She appears well-developed and well-nourished. No distress.  Cardiovascular: Normal rate, regular rhythm, normal heart sounds and intact distal pulses.   Pulmonary/Chest: Effort normal and breath sounds normal. No respiratory distress.  Abdominal: Soft. Bowel sounds are normal.  Musculoskeletal: Normal range of motion. She exhibits no tenderness.  Neurological: [name] alert and oriented to person, place, and time.  Skin: Skin is warm and dry. Capillary refill takes less than 2 seconds. No pallor.  Psychiatric: She has a normal mood and affect.  Very pleasant today, smiling and social    Labs reviewed: Basic Metabolic Panel:  Recent Labs  04/20/15 1325  06/18/15 0726  07/14/15 0635 07/27/15 2047 12/19/15 1045 01/19/16  NA 137  < > 137  < > 138 139 139 138  K 3.7  < > 3.7  < > 4.3 3.4* 3.3* 4.2  CL 99*  < > 95*  < > 102 106 107  --   CO2 29  < > 28  < > 24 20* 22  --   GLUCOSE 159*  < > 107*  < > 127* 129* 116*  --   BUN 15  < > 28*  < > 12 15 13  42*  CREATININE 1.08*  < > 1.29*  < > 0.77 0.97 0.81 1.1  CALCIUM 9.2  < > 9.6  < > 9.5 9.7 9.8  --   MG  --   --  2.0  --   --   --   --   --   TSH 0.890  --   --   --   --   --   --   --   < > = values in this interval not displayed. Liver Function Tests:  Recent Labs  07/02/15 0544  07/11/15 0511 07/27/15 2047 01/13/16  AST 21 24 31  53*  ALT 10* 10* 14 11  ALKPHOS 99 92 74 83  BILITOT 0.3 1.0 0.7  --   PROT 5.4* 6.8 7.1  --   ALBUMIN 2.6* 3.6 4.0  --     Recent Labs  07/11/15 0511 07/27/15 2047  LIPASE 23 22   No results for input(s): AMMONIA in the last 8760 hours. CBC:  Recent Labs  07/01/15 2111  07/07/15 1044 07/11/15 0511  07/14/15 0635 07/27/15 2116 12/19/15 1045 01/20/16  WBC 10.4  < > 8.3 11.8*  < > 10.0 4.4 9.2 8.8  NEUTROABS 7.0  --  4.2 6.9  --   --   --   --   --   HGB 12.3  < >  --  13.5  < > 13.2 13.6 13.9 7.6*  HCT 36.6  < > 35.5  41.7  < > 40.5 41.8 43.9 23*  MCV 95.1  < > 96 94.3  < > 95.1 97.9 95.4  --   PLT 371  < > 216 355  < > 311 PLATELET CLUMPS NOTED ON SMEAR, COUNT APPEARS DECREASED 363 262  < > = values in this interval not displayed. Lipid Panel:  Recent Labs  06/18/15 0726 07/02/15 0544  CHOL 218* 117  HDL 40* 37*  LDLCALC 137* 50  TRIG 206* 152*  CHOLHDL 5.5 3.2   Lab Results  Component Value Date   HGBA1C 6.2 (H) 07/02/2015    Procedures since last visit: Dg Lumbar Spine Complete  Result Date: 02/22/2016 CLINICAL DATA:  [place] yesterday. Low back pain into the left hip. Prior back surgeries. EXAM: LUMBAR SPINE - COMPLETE 4+ VIEW COMPARISON:  01/12/2016 FINDINGS: Postoperative changes with fixation hardware at the spinous processes at L2-3. Bone graft fusion from L4 to the sacrum. Slight anterior subluxation of L4 on L5 appears unchanged since prior study. Degenerative changes with disc space narrowing, sclerosis, hypertrophic change, and vacuum phenomenon demonstrated predominantly at L2-3 and L5-S1 levels. No acute vertebral compression deformities. No destructive bone lesions. Aortic atherosclerosis. Surgical clips in the right upper quadrant. Visualize sacrum appears intact. IMPRESSION: Postoperative and degenerative changes in the lumbar spine without significant change since prior study. Electronically Signed   By: Lucienne Capers M.D.   On: 02/22/2016 05:50    Assessment/Plan 1. Uncomplicated opioid dependence ([place]) -ongoing, ED physicians continue to prescribe opioids for her despite numerous notations in her chart -she had been off hydrocodone for several weeks, then got two hydrocodone again in the ED -pt also cannot take nsaids due to just having had a GI bleed -unfortunately, was discharged from the inpatient program and does not have  -might benefit from an injection in her back if her surgery did not truly resolve her pain  2. Right foot pain -pt continues to report this pain and  that tylenol does not help it -continues to seek pain medications by calling various offices and going to the ED -has gabapentin for her neuropathic pain from her back into her leg and foot and is on max dose for her renal function, cont use of salonpas with lidocaine also which is helping she says  3. Uncontrolled hypertension -bp elevated, but not tachycardic like she's been the last few visits when actively withdrawing and seems to be doing much better off of her narcotics - Basic metabolic panel; Future - Basic metabolic panel  4. Duodenal ulcer with hemorrhage -f/u labs, cont iron, no nsaids  due to this and esophageal ulcer that bled - CBC with Differential/Platelet; Future - Basic metabolic panel; Future - CBC with Differential/Platelet - Basic metabolic panel  5. Esophageal ulcer with bleeding - f/u labs, cont iron -cannot safely take nsaids either - CBC with Differential/Platelet; Future - Basic metabolic panel; Future - CBC with Differential/Platelet - Basic metabolic panel  6. Polyneuropathy in other diseases classified elsewhere ([place]) -cont gabapentin, but no higher doses due to renal function  Labs/tests ordered:  Orders Placed This Encounter  Procedures  . CBC with Differential/Platelet    Standing Status:   Future    Number of Occurrences:   1    Standing Expiration Date:   03/28/2016  . Basic metabolic panel    Standing Status:   Future    Number of Occurrences:   1    Standing Expiration Date:   03/28/2016    Next appt:  6 wks for med mgt  Amado Andal L. Madinah Quarry, D.O. [facility] [address], [place] [address] Cell Phone (Mon-Fri 8am-5pm):  [phone] On Call:  [phone] & follow prompts after 5pm & weekends Office Phone:  [phone] Office Fax:  [phone]

## 2016-02-27 NOTE — Telephone Encounter (Signed)
Son, [name] called and stated that he had an house emergency and will not be able to make it to his mom's appointment. (He doesn't want the patient to know he called )He wanted to let you know that patient has been in [facility] 5 times since August in the middle of the night. They have given her Tramadol shots and would go about 2 weeks. This past time they gave her 2 Hydrocodone and he is concerned with that since her chart is suppose to have an alert. He just got her off this for about a month now. Patient has canceled her appointment with Dr. Ellene Route and stated its because she rather go to a pain clinic. She also canceled her 2 GI appointments in [place]. One of the ER doctors recommended [facility]. There is also a pain clinic at Dr. Clar[facility] office. Son stated that he is upset that Elvina Sidle Dr. Friday night gave her Narcotics and he wants you to contact him.  The GI doctor put her on 2 different medications Pantoprazole sod 40mg  one daily and Ferrous sulfate 325mg  twice daily, wants to know do you want her to continue these and if so need refill through [facility].  Patient is past was on Potassium 59meq one daily and patient stopped it herself, should she still be taking this. Also stated that she is still having some issues with Depression and if you want to continue Zoloft would you send that into [facility] also.

## 2016-02-27 NOTE — Telephone Encounter (Signed)
Please see son, William's message from [place].  I saw patient today.   1) Unfortunately, pt was given two nsaids in the emergency room also which are not safe for her to take with her recent GI bleeding.  She should stop both naproxen and diclofenac.She should take tylenol ES 1000mg  po tid scheduled. She says her stools are black which could be from iron vs. Continued bleeding.   2) We checked her blood counts and electrolytes today.  We'll let you know what they are like. 3) I think it might be good for her to see the pain mgt doctor at Dr. Clar[facility] Pole office if he can do steroid pain injections. 4) She agrees to go for a mammogram.  Please encourage her to follow through with that.  She does not need a referral. 5)  She needs to take all of the meds from GI (protonix and iron) and zoloft.  We need to send to [facility].  I believe the potassium was discontinued. 6)  She never did the cologuard "poop in a bucket" colon cancer screening test I ordered in 2015.  She says she mailed it back, but the system indicates it was canceled/expired.

## 2016-02-28 ENCOUNTER — Other Ambulatory Visit: Payer: Self-pay | Admitting: Internal Medicine

## 2016-02-29 ENCOUNTER — Telehealth: Payer: Self-pay

## 2016-02-29 NOTE — Telephone Encounter (Signed)
Patient aware of her medication being refused and asks what should she take for her migraines. Please Advise

## 2016-02-29 NOTE — Telephone Encounter (Signed)
This medication is not on this patients med list should I fill this medication. Please Advise

## 2016-02-29 NOTE — Telephone Encounter (Signed)
Message left on triage voicemail: Patient is requesting lab results and would like for Dr.[name] to know that she finished her Physical Therapy post back surgery.  Spoke with patient, Patient informed of labs and mentioned that she will call her son and inform him.

## 2016-03-05 ENCOUNTER — Telehealth: Payer: Self-pay

## 2016-03-05 DIAGNOSIS — K2211 Ulcer of esophagus with bleeding: Secondary | ICD-10-CM

## 2016-03-05 DIAGNOSIS — K2100 Gastro-esophageal reflux disease with esophagitis, without bleeding: Secondary | ICD-10-CM

## 2016-03-05 DIAGNOSIS — K269 Duodenal ulcer, unspecified as acute or chronic, without hemorrhage or perforation: Secondary | ICD-10-CM

## 2016-03-05 NOTE — Telephone Encounter (Signed)
Ok, let's send her to [facility].  I believe we have some records scanned from her current GI.

## 2016-03-05 NOTE — Telephone Encounter (Signed)
Patient called requesting a referral to a GI doctor here in [place] for transportation purposes. Patient is currently established with a GI doctor in [facility] (patient not sure of doctor's name). Patient states she is being followed by GI for bleeding ulcers and black stools (active concern).    I called [facility] and patient is established with Dr.Badreddine   Dr.[name] if you agree with referral please complete pending referral

## 2016-03-08 ENCOUNTER — Encounter: Payer: Self-pay | Admitting: Internal Medicine

## 2016-03-08 ENCOUNTER — Other Ambulatory Visit: Payer: Self-pay

## 2016-03-08 MED ORDER — SUMATRIPTAN SUCCINATE 50 MG PO TABS: 50.0000 mg | ORAL_TABLET | ORAL | 0 refills | Status: DC | PRN

## 2016-03-08 NOTE — Telephone Encounter (Signed)
Referral already entered

## 2016-03-08 NOTE — Telephone Encounter (Signed)
Patient called c/o headaches x 3 days. Patient is taking extra strength tylenol with no relief. Patient would like to know if you would approve a rx for Imitrex.  Please advise

## 2016-03-08 NOTE — Telephone Encounter (Signed)
Spoke with patient and advised results rx sent to pharmacy by e-script  

## 2016-03-14 ENCOUNTER — Telehealth: Payer: Self-pay | Admitting: *Deleted

## 2016-03-14 NOTE — Telephone Encounter (Signed)
No b/c of her esophageal and duodenal ulcers that have bled.  This medication may cause that to recur.

## 2016-03-14 NOTE — Telephone Encounter (Signed)
Patient called and stated that the ER gave her a Rx for Voltaren and patient wants to know if you want her to continue this. If so, she needs a Rx faxed to [facility]. Please Advise.

## 2016-03-16 NOTE — Telephone Encounter (Signed)
Patient notified and agreed.  

## 2016-03-28 ENCOUNTER — Telehealth: Payer: Self-pay | Admitting: Internal Medicine

## 2016-03-28 NOTE — Telephone Encounter (Signed)
left msg asking if pt can come at 10:15 on 11/6 to meet with nurse first for AWV. VDM (Dee-Dee)

## 2016-04-01 ENCOUNTER — Other Ambulatory Visit: Payer: Self-pay | Admitting: Internal Medicine

## 2016-04-09 ENCOUNTER — Ambulatory Visit: Payer: Medicare Other | Admitting: Internal Medicine

## 2016-04-09 ENCOUNTER — Ambulatory Visit: Payer: Medicare Other

## 2016-04-11 ENCOUNTER — Telehealth: Payer: Self-pay | Admitting: *Deleted

## 2016-04-11 NOTE — Telephone Encounter (Signed)
[name] Braswell's wife, [name] called and stated that patient has an appointment with Raliegh Ip today and they want you to call and talk with the Dr. [name] seeing Dr. Oneita Kras regarding her Narcotic issues and not to prescribe. They don't think they are on [facility].  Also she has an appointment with you tomorrow and they wanted to let you know that she has been taking a number of Benadryl and Tylenol. Also has been having dark blood in stools. Also she is getting Muscle relaxer from Dr. [facility] office.  They also requested if you change any of the patient's medications if you would send an updated medication list to their home.

## 2016-04-11 NOTE — Telephone Encounter (Signed)
Noted.  Is Dr. Oneita Kras at [facility] or somewhere else?  I will address her gi bleeding at her appt.  I will check the [facility] database to make sure she does not get controlled meds anywhere else either.  The other providers should all have access to that, as well.  We can certainly send Konrad Dolores and his wife an updated med list after the appt.  It's also available on mychart.

## 2016-04-11 NOTE — Telephone Encounter (Signed)
After looking at our schedule tomorrow, patient has canceled tomorrow's appt and rescheduled for 04/19/16

## 2016-04-12 ENCOUNTER — Ambulatory Visit: Payer: Self-pay

## 2016-04-12 ENCOUNTER — Ambulatory Visit: Payer: Medicare Other | Admitting: Internal Medicine

## 2016-04-16 ENCOUNTER — Encounter: Payer: Self-pay | Admitting: Gastroenterology

## 2016-04-17 ENCOUNTER — Ambulatory Visit: Payer: Self-pay

## 2016-04-19 ENCOUNTER — Ambulatory Visit: Payer: Self-pay

## 2016-04-19 ENCOUNTER — Ambulatory Visit: Payer: Medicare Other | Admitting: Internal Medicine

## 2016-04-19 ENCOUNTER — Encounter: Payer: Self-pay | Admitting: Internal Medicine

## 2016-04-23 ENCOUNTER — Ambulatory Visit: Payer: Self-pay | Admitting: Gastroenterology

## 2016-04-24 ENCOUNTER — Encounter: Payer: Self-pay | Admitting: Internal Medicine

## 2016-04-24 ENCOUNTER — Ambulatory Visit: Payer: Self-pay | Admitting: Nurse Practitioner

## 2016-04-24 DIAGNOSIS — Z0289 Encounter for other administrative examinations: Secondary | ICD-10-CM

## 2016-04-25 DIAGNOSIS — R55 Syncope and collapse: Secondary | ICD-10-CM | POA: Diagnosis not present

## 2016-04-25 DIAGNOSIS — R404 Transient alteration of awareness: Secondary | ICD-10-CM | POA: Diagnosis not present

## 2016-04-30 ENCOUNTER — Ambulatory Visit (INDEPENDENT_AMBULATORY_CARE_PROVIDER_SITE_OTHER): Payer: Medicare Other | Admitting: Internal Medicine

## 2016-04-30 ENCOUNTER — Ambulatory Visit (INDEPENDENT_AMBULATORY_CARE_PROVIDER_SITE_OTHER): Payer: Medicare Other

## 2016-04-30 ENCOUNTER — Encounter: Payer: Self-pay | Admitting: Internal Medicine

## 2016-04-30 VITALS — BP 190/102 | HR 87 | Temp 98.5°F | Wt 187.0 lb

## 2016-04-30 VITALS — BP 190/100 | HR 96 | Temp 98.5°F | Ht 63.0 in | Wt 187.2 lb

## 2016-04-30 DIAGNOSIS — K269 Duodenal ulcer, unspecified as acute or chronic, without hemorrhage or perforation: Secondary | ICD-10-CM

## 2016-04-30 DIAGNOSIS — D500 Iron deficiency anemia secondary to blood loss (chronic): Secondary | ICD-10-CM

## 2016-04-30 DIAGNOSIS — F1120 Opioid dependence, uncomplicated: Secondary | ICD-10-CM | POA: Diagnosis not present

## 2016-04-30 DIAGNOSIS — M25511 Pain in right shoulder: Secondary | ICD-10-CM

## 2016-04-30 DIAGNOSIS — Z0000 Encounter for general adult medical examination without abnormal findings: Secondary | ICD-10-CM | POA: Diagnosis not present

## 2016-04-30 DIAGNOSIS — I10 Essential (primary) hypertension: Secondary | ICD-10-CM | POA: Diagnosis not present

## 2016-04-30 DIAGNOSIS — G63 Polyneuropathy in diseases classified elsewhere: Secondary | ICD-10-CM

## 2016-04-30 DIAGNOSIS — K2211 Ulcer of esophagus with bleeding: Secondary | ICD-10-CM | POA: Diagnosis not present

## 2016-04-30 LAB — CBC WITH DIFFERENTIAL/PLATELET
Basophils Absolute: 89 cells/uL (ref 0–200)
Basophils Relative: 1 %
Eosinophils Absolute: 178 cells/uL (ref 15–500)
Eosinophils Relative: 2 %
HCT: 38.7 % (ref 35.0–45.0)
Hemoglobin: 12.4 g/dL (ref 11.7–15.5)
Lymphocytes Relative: 35 %
Lymphs Abs: 3115 cells/uL (ref 850–3900)
MCH: 32 pg (ref 27.0–33.0)
MCHC: 32 g/dL (ref 32.0–36.0)
MCV: 99.7 fL (ref 80.0–100.0)
MPV: 9.1 fL (ref 7.5–12.5)
Monocytes Absolute: 712 cells/uL (ref 200–950)
Monocytes Relative: 8 %
Neutro Abs: 4806 cells/uL (ref 1500–7800)
Neutrophils Relative %: 54 %
Platelets: 270 10*3/uL (ref 140–400)
RBC: 3.88 MIL/uL (ref 3.80–5.10)
RDW: 17 % — ABNORMAL HIGH (ref 11.0–15.0)
WBC: 8.9 10*3/uL (ref 3.8–10.8)

## 2016-04-30 LAB — IRON AND TIBC
%SAT: 13 % (ref 11–50)
Iron: 39 ug/dL — ABNORMAL LOW (ref 45–160)
TIBC: 298 ug/dL (ref 250–450)
UIBC: 259 ug/dL (ref 125–400)

## 2016-04-30 NOTE — Progress Notes (Signed)
Quick Notes   Health Maintenance:   [facility] appt next week; Will call pharmacy about OOP expenses for Shingles and TDAP.    Abnormal Screen:  MMSE-29/30 Failed Clock Test; BP 190/102   Patient Concerns:   None   Nurse Concerns:   BP elevated; pt states she has had her medicine

## 2016-04-30 NOTE — Patient Instructions (Addendum)
[name] , Thank you for taking time to come for your Medicare Wellness Visit. I appreciate your ongoing commitment to your health goals. Please review the following plan we discussed and let me know if I can assist you in the future.   These are the goals we discussed: Goals    . Increase water intake          Starting 04/30/16, I will attempt to drink up to 5 glasses per day.       This is a list of the screening recommended for you and due dates:  Health Maintenance  Topic Date Due  . Mammogram  06/01/2016*  . Tetanus Vaccine  06/03/2017*  . Shingles Vaccine  06/03/2018*  . Colon Cancer Screening  01/30/2022  . Flu Shot  Completed  . DEXA scan (bone density measurement)  Completed  . Pneumonia vaccines  Completed  *Topic was postponed. The date shown is not the original due date.  Preventive Care for Adults  A healthy lifestyle and preventive care can promote health and wellness. Preventive health guidelines for adults include the following key practices.  . A routine yearly physical is a good way to check with your health care provider about your health and preventive screening. It is a chance to share any concerns and updates on your health and to receive a thorough exam.  . Visit your dentist for a routine exam and preventive care every 6 months. Brush your teeth twice a day and floss once a day. Good oral hygiene prevents tooth decay and gum disease.  . The frequency of eye exams is based on your age, health, family medical history, use  of contact lenses, and other factors. Follow your health care provider's ecommendations for frequency of eye exams.  . Eat a healthy diet. Foods like vegetables, fruits, whole grains, low-fat dairy products, and lean protein foods contain the nutrients you need without too many calories. Decrease your intake of foods high in solid fats, added sugars, and salt. Eat the right amount of calories for you. Get information about a proper diet  from your health care provider, if necessary.  . Regular physical exercise is one of the most important things you can do for your health. Most adults should get at least 150 minutes of moderate-intensity exercise (any activity that increases your heart rate and causes you to sweat) each week. In addition, most adults need muscle-strengthening exercises on 2 or more days a week.  Silver Sneakers may be a benefit available to you. To determine eligibility, you may visit the website: www.silversneakers.com or contact program at [phone] Mon-Fri between 8AM-8PM.   . Maintain a healthy weight. The body mass index (BMI) is a screening tool to identify possible weight problems. It provides an estimate of body fat based on height and weight. Your health care provider can find your BMI and can help you achieve or maintain a healthy weight.   For adults 20 years and older: ? A BMI below 18.5 is considered underweight. ? A BMI of 18.5 to 24.9 is normal. ? A BMI of 25 to 29.9 is considered overweight. ? A BMI of 30 and above is considered obese.   . Maintain normal blood lipids and cholesterol levels by exercising and minimizing your intake of saturated fat. Eat a balanced diet with plenty of fruit and vegetables. Blood tests for lipids and cholesterol should begin at age 74 and be repeated every 5 years. If your lipid or cholesterol levels are  high, you are over 50, or you are at high risk for heart disease, you may need your cholesterol levels checked more frequently. Ongoing high lipid and cholesterol levels should be treated with medicines if diet and exercise are not working.  . If you smoke, find out from your health care provider how to quit. If you do not use tobacco, please do not start.  . If you choose to drink alcohol, please do not consume more than 2 drinks per day. One drink is considered to be 12 ounces (355 mL) of beer, 5 ounces (148 mL) of wine, or 1.5 ounces (44 mL) of liquor.  .  If you are 74-3 years old, ask your health care provider if you should take aspirin to prevent strokes.  . Use sunscreen. Apply sunscreen liberally and repeatedly throughout the day. You should seek shade when your shadow is shorter than you. Protect yourself by wearing long sleeves, pants, a wide-brimmed hat, and sunglasses year round, whenever you are outdoors.  . Once a month, do a whole body skin exam, using a mirror to look at the skin on your back. Tell your health care provider of new moles, moles that have irregular borders, moles that are larger than a pencil eraser, or moles that have changed in shape or color.

## 2016-04-30 NOTE — Progress Notes (Signed)
Subjective:   [name] is a 75 y.o. female who presents for an Initial Medicare Annual Wellness Visit.  Review of Systems      Cardiac Risk Factors include: advanced age (>74men, >59 women);hypertension;family history of premature cardiovascular disease;sedentary lifestyle;obesity (BMI >30kg/m2)     Objective:    Today's Vitals   04/30/16 1501  BP: (!) 190/100  Pulse: 96  Temp: 98.5 F (36.9 C)  TempSrc: Oral  SpO2: 96%  Weight: 187 lb 3.2 oz (84.9 kg)  Height: 5\' 3"  (1.6 m)  PainSc: 0-No pain   Body mass index is 33.16 kg/m.   Current Medications (verified) Outpatient Encounter Prescriptions as of 04/30/2016  Medication Sig  . acetaminophen (TYLENOL) 500 MG tablet Take 500 mg by mouth every 8 (eight) hours as needed for moderate pain.  Marland Kitchen atorvastatin (LIPITOR) 40 MG tablet TAKE 1 TABLET BY MOUTH ONCE DAILY FOR CHOLESTEROL  . cholecalciferol (VITAMIN D) 1000 units tablet Take 1,000 Units by mouth daily.  . cyclobenzaprine (FLEXERIL) 10 MG tablet Take 10 mg by mouth 3 (three) times daily as needed for muscle spasms.  . diphenhydrAMINE (BENADRYL) 25 MG tablet Take 25 mg by mouth Nightly.  . docusate sodium (COLACE) 100 MG capsule Take 100 mg by mouth daily as needed for mild constipation.  . ferrous sulfate 325 (65 FE) MG EC tablet Take 1 tablet (325 mg total) by mouth 2 (two) times daily.  Marland Kitchen gabapentin (NEURONTIN) 400 MG capsule Take 400 mg by mouth 4 (four) times daily.  . Liniments (SALONPAS EX) Apply topically as needed.  Marland Kitchen lisinopril (PRINIVIL,ZESTRIL) 5 MG tablet Take 5 mg by mouth daily.  . metoprolol (LOPRESSOR) 50 MG tablet Take 50 mg by mouth 2 (two) times daily.  . pantoprazole (PROTONIX) 40 MG tablet Take 1 tablet (40 mg total) by mouth daily.  Marland Kitchen pyridOXINE (VITAMIN B-6) 50 MG tablet Take 50 mg by mouth daily.  . sertraline (ZOLOFT) 50 MG tablet TAKE TWO TABLETS BY MOUTH  ONCE DAILY FOR DEPRESSION  . sodium chloride (OCEAN) 0.65 % SOLN nasal spray  Place 1 spray into both nostrils as needed for congestion.  . SUMAtriptan (IMITREX) 50 MG tablet Take 1 tablet (50 mg total) by mouth every 2 (two) hours as needed for migraine. May repeat in once if headache persists or recurs.  . [DISCONTINUED] methocarbamol (ROBAXIN) 500 MG tablet Take 1 tablet (500 mg total) by mouth 2 (two) times daily.   No facility-administered encounter medications on file as of 04/30/2016.     Allergies (verified) Contrast media [iodinated diagnostic agents]; Acyclovir and related; and Sulfa drugs cross reactors   History: Past Medical History:  Diagnosis Date  . Anxiety   . Arthritis    djd  . Cataracts, bilateral    immature   . Cellulitis    10/11/11 hospitalized for cellulitis   . Chronic back pain    scoliosis/stenosis  . Depression    takes Zoloft daily  . Depression   . Dysrhythmia    hx tachy palpitations  . Elbow fracture, left April 2015  . GERD (gastroesophageal reflux disease)    hx pud '98  . Headache(784.0)    migraines yrs ago  . History of bronchitis    many yrs ago  . Hyperlipemia    takes Atorvastatin daily  . Hypertension    takes HCTZ daily  . Insomnia   . Joint pain   . Joint swelling   . Neuromuscular disorder ([place])  peripheral neuropathy, FEET AND LEGS  . Peripheral neuropathy ([place])   . Peripheral neuropathy ([facility])    takes Gabapentin daily  . 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 Radiculopathy, lumbar region   . Restless leg syndrome    takes Sinemet daily  . Stroke [facility])    ? 6 months ago - tests okay - [facility]   . Vertigo    Past Surgical History:  Procedure Laterality Date  . ABDOMINAL HYSTERECTOMY  1975   bil oophorectomy  . BACK SURGERY   MANY YRS AGO   laminectomy x3  . CARPAL TUNNEL RELEASE  07/09/2012   Procedure: CARPAL TUNNEL RELEASE;  Surgeon: Magnus Sinning, MD;  Location: [facility];  Service: Orthopedics;  Laterality: Left;  . cervical disc surgery x2  2011  . CHOLECYSTECTOMY  1993    . colonosocpy    . ESOPHAGOGASTRODUODENOSCOPY    . FINGER ARTHROPLASTY  07/09/2012   Procedure: FINGER ARTHROPLASTY;  Surgeon: Magnus Sinning, MD;  Location: [facility];  Service: Orthopedics;  Laterality: Left;  Interposition Arthroplasty [facility] Joint Thumb Left   . HEMATOMA EVACUATION  2011   s/p rt cea  . JOINT REPLACEMENT  '01   total knee replacement, LEFT  . left knee arthroscopy  2001  . LIPOMA EXCISION  07/09/2012   Procedure: EXCISION LIPOMA;  Surgeon: Magnus Sinning, MD;  Location: [facility];  Service: Orthopedics;  Laterality: Left;  Excision Lipoma Dorsum Left Wrist   . LUMBAR LAMINECTOMY WITH COFLEX 1 LEVEL Bilateral 04/19/2015   Procedure: LUMBAR TWO-THREE LUMBAR LAMINECTOMY WITH COFLEX ;  Surgeon: Kristeen Miss, MD;  Location: [place] [facility];  Service: Neurosurgery;  Laterality: Bilateral;  Bilateral L23 laminectomy and foraminotomy with coflex  . LUMBAR LAMINECTOMY/DECOMPRESSION MICRODISCECTOMY Right 12/19/2015   Procedure: Right Lumbar two-three  Microdiskectomy;  Surgeon: Kristeen Miss, MD;  Location: [place] [facility];  Service: Neurosurgery;  Laterality: Right;  . ORIF ANKLE FRACTURE Right 09/14/2014   FIBULA   . ORIF ANKLE FRACTURE Right 09/14/2014   Procedure: OPEN REDUCTION INTERNAL FIXATION (ORIF) RIGHT ANKLE FRACTURE/SYNDESMOSIS ;  Surgeon: Wylene Simmer, MD;  Location: [place];  Service: Orthopedics;  Laterality: Right;  . right knee replacement     09/2011   . rt carotid enarterectomy  2011  . rt knee arthroscopy  '99  . TOTAL KNEE ARTHROPLASTY  09/13/2011   Procedure: TOTAL KNEE ARTHROPLASTY;  Surgeon: Magnus Sinning, MD;  Location: [facility];  Service: Orthopedics;  Laterality: Right;   Family History  Problem Relation Age of Onset  . Congestive Heart Failure Father   . Congestive Heart Failure Sister   . Alzheimer's disease Mother   . Diabetes Brother   . Cancer Brother   . Testicular cancer Brother   . Arthritis Brother   . Cancer Brother   . Diabetes Brother   . Prostate  cancer Brother    Social History   Occupational History  . Not on file.   Social History Main Topics  . Smoking status: Never Smoker  . Smokeless tobacco: Never Used  . Alcohol use No  . Drug use: No  . Sexual activity: No    Tobacco Counseling Counseling given: No   Activities of Daily Living In your present state of health, do you have any difficulty performing the following activities: 04/30/2016 12/19/2015  Hearing? N N  Vision? Y N  Difficulty concentrating or making decisions? N N  Walking or climbing stairs? Darreld Mclean  Y  Dressing or bathing? N N  Doing errands, shopping? Y -  Conservation officer, nature and eating ? N -  Using the Toilet? N -  In the past six months, have you accidently leaked urine? N -  Do you have problems with loss of bowel control? N -  Managing your Medications? Y -  Managing your Finances? Y -  Housekeeping or managing your Housekeeping? N -  Some encounter information is confidential and restricted. Go to Review Flowsheets activity to see all data.  Some recent data might be hidden    Immunizations and Health Maintenance Immunization History  Administered Date(s) Administered  . Influenza,inj,Quad PF,36+ Mos 01/23/2016  . Influenza-Unspecified 02/26/2013, 03/12/2014, 03/05/2015  . Pneumococcal Conjugate-13 05/12/2014  . Pneumococcal Polysaccharide-23 02/26/2013, 06/20/2015   There are no preventive care reminders to display for this patient.  Patient Care Team: [name], DO as PCP - General (Geriatric Medicine) Kathrynn Ducking, MD as Consulting Physician (Neurology) Kristeen Miss, MD as Consulting Physician (Neurosurgery)  Indicate any recent Medical Services you may have received from other than [facility] providers in the past year (date may be approximate).     Assessment:   This is a routine wellness examination for Jarrica.   Hearing/Vision screen Hearing Screening Comments: Last hearing screen done 6 yrs ago. Pt states she has no problems.    Vision Screening Comments: Eye Exam done at [facility] in 2016. Due for eye exam.  Dietary issues and exercise activities discussed: Current Exercise Habits: Home exercise routine, Type of exercise: stretching, Time (Minutes): 15, Frequency (Times/Week): 7, Weekly Exercise (Minutes/Week): 105, Intensity: Mild  Goals    . Increase water intake          Starting 04/30/16, I will attempt to drink up to 5 glasses per day.      Depression Screen PHQ 2/9 Scores 04/30/2016 02/27/2016 01/23/2016 12/03/2014 11/11/2014 05/24/2014 01/11/2014  PHQ - 2 Score 0 0 1 1 4  0 0  PHQ- 9 Score - - - 5 11 - -    Fall Risk Fall Risk  04/30/2016 02/27/2016 01/23/2016 09/05/2015 09/05/2015  Falls in the past year? Yes Yes Yes Yes Yes  Number falls in past yr: 2 or more - 2 or more 2 or more 1  Injury with Fall? Yes No No Yes No  Risk Factor Category  High Fall Risk - - High Fall Risk -  Risk for fall due to : History of fall(s);Impaired balance/gait - - History of fall(s);Impaired balance/gait;Impaired mobility;Medication side effect;Mental status change -  Follow up Falls prevention discussed - - Falls evaluation completed;Education provided;Falls prevention discussed -    Cognitive Function: MMSE - Mini Mental State Exam 04/30/2016 01/06/2015  Orientation to time 5 5  Orientation to Place 5 5  Registration 3 3  Attention/ Calculation 5 5  Recall 2 3  Language- name 2 objects 2 2  Language- repeat 1 1  Language- follow 3 step command 3 3  Language- read & follow direction 1 1  Write a sentence 1 1  Copy design 1 1  Total score 29 30        Screening Tests Health Maintenance  Topic Date Due  . MAMMOGRAM  06/01/2016 (Originally 12/31/2009)  . TETANUS/TDAP  06/03/2017 (Originally 01/26/1960)  . ZOSTAVAX  06/03/2018 (Originally 01/26/2002)  . COLONOSCOPY  01/30/2022  . INFLUENZA VACCINE  Completed  . DEXA SCAN  Completed  . PNA vac Low Risk Adult  Completed  Plan:    I have personally reviewed  and addressed the Medicare Annual Wellness questionnaire and have noted the following in the patient's chart:  A. Medical and social history B. Use of alcohol, tobacco or illicit drugs  C. Current medications and supplements D. Functional ability and status E.  Nutritional status F.  Physical activity G. Advance directives H. List of other physicians I.  Hospitalizations, surgeries, and ER visits in previous 12 months J.  [place] to include hearing, vision, cognitive, depression L. Referrals and appointments - none  In addition, I have reviewed and discussed with patient certain preventive protocols, quality metrics, and best practice recommendations. A written personalized care plan for preventive services as well as general preventive health recommendations were provided to patient.  See attached scanned questionnaire for additional information.   Signed,   Allyn Kenner, LPN Health Advisor     I reviewed health advisor's note, was available for consultation and agree with the assessment and plan as written.    Pt was counseled during her visit with me after this about how she should NOT take benadryl or flexeril due to their sedating effects and increased risk of falls and confusion.  She has a h/o opioid dependence and abuse.  She calls on call providers and seeks pain medications from her neurosurgeon, neurologist, psychiatrist, myself.  Her son does not want to control the otc meds for her b/c then she calls him over and over at night to get more when she uses them up and she goes and buys more anyway.    Her blood pressure was also addressed (suspect effect of overdoing the above meds perhaps)--metoprolol was increased to 100mg  po bid from 50mg  po bid due to severity of bp elevation.  I also do not believe her home readings b/c pt is not honest with me.       Godwin Tedesco L. Alline Pio, D.O. [facility] [address], [place] [address] Cell Phone (Mon-Fri 8am-5pm):  [phone] On Call:  [phone] & follow prompts after 5pm & weekends Office Phone:  [phone] Office Fax:  [phone]

## 2016-04-30 NOTE — Progress Notes (Signed)
Location:  [facility] clinic Provider:  Willadene Mounsey L. Mariea Clonts, D.O., C.M.D.  Code Status: DNR Goals of Care:  Advanced Directives 04/30/2016  Does Patient Have a Medical Advance Directive? Yes;No  Type of Advance Directive -  Does patient want to make changes to medical advance directive? -  Copy of [place] in Chart? No - copy requested  Would patient like information on creating a medical advance directive? -  Pre-existing out of facility DNR order (yellow form or pink MOST form) -  Some encounter information is confidential and restricted. Go to Review Flowsheets activity to see all data.     Chief Complaint  Patient presents with  . Medical Management of Chronic Issues    6 week follow-up    HPI: Patient is a 75 y.o. female seen today for medical management of chronic diseases.    She has not been getting narcotics since last visit.  She convinced the nurse navigator to put benadryl and flexeril back on her med list and I have removed them again.   BP is sky high again today at 190/102.  Up to 200/140 when I checked it.   Says at home it is [id number] systolic.    She has not been taking any controlled substances from other providers. Dr. Ellene Route has her on flexeril for back spasms and she is using benadryl for sleep against my recommendations.  Sees gastro PA tomorrow.  Stopped her asa 81mg  until she sees them.  Still having black stool.  Had diarrhea and vomiting last week. When she took iron before, the stools were not black then.    Has her mammogram next Friday.  Says she has some blurry vision which she thinks could be due to anemia.  Was a little dizzy yesterday.  Has been sick since before thanksgiving.  She fell twice.    Right shoulder was bothering her after a fall a couple of weeks ago.  Has an appt with Dr. Jacqulyn Bath about this coming up.  Will send Raliegh Ip a note about how she is not to receive pain medications.  Says she is not taking more than 3g  per day of tylenol in 24 hrs.  Advised against flexeril and benadryl due to falls and confusion.  Says her neuropathy is worsening and she cannot feel her feet at times. She apparently calls her son over and over after taking these.     Past Medical History:  Diagnosis Date  . Anxiety   . Arthritis    djd  . Cataracts, bilateral    immature   . Cellulitis    10/11/11 hospitalized for cellulitis   . Chronic back pain    scoliosis/stenosis  . Depression    takes Zoloft daily  . Depression   . Dysrhythmia    hx tachy palpitations  . Elbow fracture, left April 2015  . GERD (gastroesophageal reflux disease)    hx pud '98  . Headache(784.0)    migraines yrs ago  . History of bronchitis    many yrs ago  . Hyperlipemia    takes Atorvastatin daily  . Hypertension    takes HCTZ daily  . Insomnia   . Joint pain   . Joint swelling   . Neuromuscular disorder ([place])    peripheral neuropathy, FEET AND LEGS  . Peripheral neuropathy ([place])   . Peripheral neuropathy (HCC)    takes Gabapentin daily  . PPD positive, treated 1987   tx'd x 1 yr w/  inh  . Radiculopathy, lumbar region   . Restless leg syndrome    takes Sinemet daily  . Stroke [facility])    ? 6 months ago - tests okay - [facility]   . Vertigo     Past Surgical History:  Procedure Laterality Date  . ABDOMINAL HYSTERECTOMY  1975   bil oophorectomy  . BACK SURGERY   MANY YRS AGO   laminectomy x3  . CARPAL TUNNEL RELEASE  07/09/2012   Procedure: CARPAL TUNNEL RELEASE;  Surgeon: Magnus Sinning, MD;  [facility];  Service: Orthopedics;  Laterality: Left;  . cervical disc surgery x2  2011  . CHOLECYSTECTOMY  1993  . colonosocpy    . ESOPHAGOGASTRODUODENOSCOPY    . FINGER ARTHROPLASTY  07/09/2012   Procedure: FINGER ARTHROPLASTY;  Surgeon: Magnus Sinning, MD;  [facility];  Service: Orthopedics;  Laterality: Left;  Interposition Arthroplasty CMC Joint Thumb Left   . HEMATOMA EVACUATION  2011   s/p rt cea  . JOINT  REPLACEMENT  '01   total knee replacement, LEFT  . left knee arthroscopy  2001  . LIPOMA EXCISION  07/09/2012   Procedure: EXCISION LIPOMA;  Surgeon: Magnus Sinning, MD;  [facility];  Service: Orthopedics;  Laterality: Left;  Excision Lipoma Dorsum Left Wrist   . LUMBAR LAMINECTOMY WITH COFLEX 1 LEVEL Bilateral 04/19/2015   Procedure: LUMBAR TWO-THREE LUMBAR LAMINECTOMY WITH COFLEX ;  Surgeon: Kristeen Miss, MD;  Location: [place] [facility];  Service: Neurosurgery;  Laterality: Bilateral;  Bilateral L23 laminectomy and foraminotomy with coflex  . LUMBAR LAMINECTOMY/DECOMPRESSION MICRODISCECTOMY Right 12/19/2015   Procedure: Right Lumbar two-three  Microdiskectomy;  Surgeon: Kristeen Miss, MD;  Location: [place] [facility];  Service: Neurosurgery;  Laterality: Right;  . ORIF ANKLE FRACTURE Right 09/14/2014   FIBULA   . ORIF ANKLE FRACTURE Right 09/14/2014   Procedure: OPEN REDUCTION INTERNAL FIXATION (ORIF) RIGHT ANKLE FRACTURE/SYNDESMOSIS ;  Surgeon: Wylene Simmer, MD;  Location: [place];  Service: Orthopedics;  Laterality: Right;  . right knee replacement     09/2011   . rt carotid enarterectomy  2011  . rt knee arthroscopy  '99  . TOTAL KNEE ARTHROPLASTY  09/13/2011   Procedure: TOTAL KNEE ARTHROPLASTY;  Surgeon: Magnus Sinning, MD;  [facility];  Service: Orthopedics;  Laterality: Right;    Allergies  Allergen Reactions  . Contrast Media [Iodinated Diagnostic Agents] Anaphylaxis  . Acyclovir And Related Other (See Comments)    Reaction:  Unknown   . Sulfa Drugs Cross Reactors Nausea And Vomiting      Medication List       Accurate as of 04/30/16  4:06 PM. Always use your most recent med list.          acetaminophen 500 MG tablet Commonly known as:  TYLENOL Take 500 mg by mouth every 8 (eight) hours as needed for moderate pain.   atorvastatin 40 MG tablet Commonly known as:  LIPITOR TAKE 1 TABLET BY MOUTH ONCE DAILY FOR CHOLESTEROL   cholecalciferol 1000 units  tablet Commonly known as:  VITAMIN D Take 1,000 Units by mouth daily.   cyclobenzaprine 10 MG tablet Commonly known as:  FLEXERIL Take 10 mg by mouth 3 (three) times daily as needed for muscle spasms.   diphenhydrAMINE 25 MG tablet Commonly known as:  BENADRYL Take 25 mg by mouth Nightly.   docusate sodium 100 MG capsule Commonly known as:  COLACE Take 100 mg by mouth daily as needed for mild constipation.  ferrous sulfate 325 (65 FE) MG EC tablet Take 1 tablet (325 mg total) by mouth 2 (two) times daily.   gabapentin 400 MG capsule Commonly known as:  NEURONTIN Take 400 mg by mouth 4 (four) times daily.   lisinopril 5 MG tablet Commonly known as:  PRINIVIL,ZESTRIL Take 5 mg by mouth daily.   metoprolol 50 MG tablet Commonly known as:  LOPRESSOR Take 50 mg by mouth 2 (two) times daily.   pantoprazole 40 MG tablet Commonly known as:  PROTONIX Take 1 tablet (40 mg total) by mouth daily.   pyridOXINE 50 MG tablet Commonly known as:  VITAMIN B-6 Take 50 mg by mouth daily.   SALONPAS EX Apply topically as needed.   sertraline 50 MG tablet Commonly known as:  ZOLOFT TAKE TWO TABLETS BY MOUTH  ONCE DAILY FOR DEPRESSION   sodium chloride 0.65 % Soln nasal spray Commonly known as:  OCEAN Place 1 spray into both nostrils as needed for congestion.   SUMAtriptan 50 MG tablet Commonly known as:  IMITREX Take 1 tablet (50 mg total) by mouth every 2 (two) hours as needed for migraine. May repeat in once if headache persists or recurs.       Review of Systems:  Review of Systems  Constitutional: Negative for chills, fever and malaise/fatigue.  HENT: Negative for congestion.   Eyes: Negative for blurred vision.  Respiratory: Negative for shortness of breath.   Cardiovascular: Negative for chest pain, palpitations and leg swelling.  Gastrointestinal: Negative for abdominal pain.  Genitourinary: Negative for dysuria.  Musculoskeletal: Positive for back pain, falls and  joint pain.  Skin: Negative for itching and rash.       Flushing of her face  Neurological: Positive for tingling and sensory change. Negative for dizziness, loss of consciousness and weakness.  Psychiatric/Behavioral: Positive for depression and memory loss. The patient is nervous/anxious.     Health Maintenance  Topic Date Due  . MAMMOGRAM  06/01/2016 (Originally 12/31/2009)  . TETANUS/TDAP  06/03/2017 (Originally 01/26/1960)  . ZOSTAVAX  06/03/2018 (Originally 01/26/2002)  . COLONOSCOPY  01/30/2022  . INFLUENZA VACCINE  Completed  . DEXA SCAN  Completed  . PNA vac Low Risk Adult  Completed    Physical Exam: Vitals:   04/30/16 1544  BP: (!) 190/102  Pulse: 87  Temp: 98.5 F (36.9 C)  TempSrc: Oral  SpO2: 96%  Weight: 187 lb (84.8 kg)   Body mass index is 33.13 kg/m. Physical Exam  Constitutional: [name] oriented to person, place, and time. She appears well-developed and well-nourished. No distress.  HENT:  Very flushed and warm  Cardiovascular: Normal rate, regular rhythm, normal heart sounds and intact distal pulses.   Pulmonary/Chest: Effort normal and breath sounds normal. No respiratory distress.  Abdominal: Soft. Bowel sounds are normal. She exhibits no distension. There is no tenderness.  Musculoskeletal: Normal range of motion. She exhibits no tenderness.  Neurological: [name] alert and oriented to person, place, and time.  Skin: Skin is warm and dry. There is erythema.  Facial flushing, sweating   Psychiatric: She has a normal mood and affect.    Labs reviewed: Basic Metabolic Panel:  Recent Labs  06/18/15 0726  07/27/15 2047 12/19/15 1045 01/19/16 02/27/16 1446  NA 137  < > 139 139 138 135  K 3.7  < > 3.4* 3.3* 4.2 4.9  CL 95*  < > 106 107  --  104  CO2 28  < > 20* 22  --  22  GLUCOSE 107*  < > 129* 116*  --  103*  BUN 28*  < > 15 13 42* 20  CREATININE 1.29*  < > 0.97 0.81 1.1 0.79  CALCIUM 9.6  < > 9.7 9.8  --  9.5  MG 2.0  --   --   --   --   --    < > = values in this interval not displayed. Liver Function Tests:  Recent Labs  07/02/15 0544 07/11/15 0511 07/27/15 2047 01/13/16  AST 21 24 31  53*  ALT 10* 10* 14 11  ALKPHOS 99 92 74 83  BILITOT 0.3 1.0 0.7  --   PROT 5.4* 6.8 7.1  --   ALBUMIN 2.6* 3.6 4.0  --     Recent Labs  07/11/15 0511 07/27/15 2047  LIPASE 23 22   No results for input(s): AMMONIA in the last 8760 hours. CBC:  Recent Labs  07/07/15 1044 07/11/15 0511  07/27/15 2116 12/19/15 1045 01/20/16 02/27/16 1446  WBC 8.3 11.8*  < > 4.4 9.2 8.8 6.5  NEUTROABS 4.2 6.9  --   --   --   --  3,250  HGB  --  13.5  < > 13.6 13.9 7.6* 11.7  HCT 35.5 41.7  < > 41.8 43.9 23* 37.1  MCV 96 94.3  < > 97.9 95.4  --  96.9  PLT 216 355  < > PLATELET CLUMPS NOTED ON SMEAR, COUNT APPEARS DECREASED 363 262 267  < > = values in this interval not displayed. Lipid Panel:  Recent Labs  06/18/15 0726 07/02/15 0544  CHOL 218* 117  HDL 40* 37*  LDLCALC 137* 50  TRIG 206* 152*  CHOLHDL 5.5 3.2   Lab Results  Component Value Date   HGBA1C 6.2 (H) 07/02/2015     Assessment/Plan 1. Uncontrolled hypertension - bp very high today and went up when I rechecked it - counseled pt to go up on her metoprolol from 50mg  po bid to 100mg  po bid due to hypertension -I still question if she is withdrawing from narcotics though none have been prescribed recently (?does she have a stash she takes and stops before appts--also are her symptoms of flushing, warmth, htn due to excess benadryl and flexeril she's been advised not to use? - Basic metabolic panel  2. Uncomplicated opioid dependence ([place]) -see above -NO CONTROLLED SUBSTANCES TO BE PRESCRIBED TO HER B/C [name] SUPPOSED TO BE CLEAR OF THESE NOW -MONITOR [place] -I sent a message to Raliegh Ip where she is meant to go about her shoulder if she stops canceling her appts  3. Duodenal ulcer - f/u labs b/c she did temporarily take nsaids from the ED just after this  diagnosis AND she continues to have some dark stools and was dizzy prior to her recent fall - CBC with Differential/Platelet - IBC Panel - Ferritin  4. Ulcer of esophagus with bleeding - see#3 - CBC with Differential/Platelet - IBC Panel - Ferritin  5. Polyneuropathy in other diseases classified elsewhere ([place]) -cont gabapentin at current dosage--cannot take more due to her age and renal function--leads to sedation and delirium  6. Acute pain of right shoulder -since her last fall by report -cont use of ice, tylenol, never follows through with therapy, keep appt with Dr. Lynann Bologna whose office was informed about her opioid seeking tendencies  Labs/tests ordered:   Orders Placed This Encounter  Procedures  . CBC with Differential/Platelet  . Basic metabolic panel  . IBC  Panel  . Ferritin  . Iron and TIBC    Next appt:  3 mos and prn  Dnaiel Voller L. Rober Skeels, D.O. [facility] [address], [place] [address] Cell Phone (Mon-Fri 8am-5pm):  [phone] On Call:  [phone] & follow prompts after 5pm & weekends Office Phone:  [phone] Office Fax:  [phone]

## 2016-04-30 NOTE — Patient Instructions (Signed)
Increase metoprolol to 100mg  twice daily instead of once a day.

## 2016-05-01 ENCOUNTER — Encounter: Payer: Self-pay | Admitting: Gastroenterology

## 2016-05-01 ENCOUNTER — Telehealth: Payer: Self-pay | Admitting: Internal Medicine

## 2016-05-01 ENCOUNTER — Ambulatory Visit (INDEPENDENT_AMBULATORY_CARE_PROVIDER_SITE_OTHER): Payer: Medicare Other | Admitting: Gastroenterology

## 2016-05-01 ENCOUNTER — Encounter: Payer: Self-pay | Admitting: Internal Medicine

## 2016-05-01 VITALS — BP 188/90 | HR 88 | Ht 65.0 in | Wt 187.5 lb

## 2016-05-01 DIAGNOSIS — K2211 Ulcer of esophagus with bleeding: Secondary | ICD-10-CM

## 2016-05-01 DIAGNOSIS — K269 Duodenal ulcer, unspecified as acute or chronic, without hemorrhage or perforation: Secondary | ICD-10-CM

## 2016-05-01 DIAGNOSIS — Z1211 Encounter for screening for malignant neoplasm of colon: Secondary | ICD-10-CM

## 2016-05-01 DIAGNOSIS — R195 Other fecal abnormalities: Secondary | ICD-10-CM

## 2016-05-01 LAB — BASIC METABOLIC PANEL
BUN: 16 mg/dL (ref 7–25)
CO2: 23 mmol/L (ref 20–31)
Calcium: 9.4 mg/dL (ref 8.6–10.4)
Chloride: 104 mmol/L (ref 98–110)
Creat: 0.8 mg/dL (ref 0.60–0.93)
Glucose, Bld: 99 mg/dL (ref 65–99)
Potassium: 4.7 mmol/L (ref 3.5–5.3)
Sodium: 136 mmol/L (ref 135–146)

## 2016-05-01 LAB — FERRITIN: Ferritin: 100 ng/mL (ref 20–288)

## 2016-05-01 MED ORDER — NA SULFATE-K SULFATE-MG SULF 17.5-3.13-1.6 GM/177ML PO SOLN: 1.0000 | Freq: Once | ORAL | 0 refills | Status: AC

## 2016-05-01 NOTE — Patient Instructions (Signed)

## 2016-05-01 NOTE — Telephone Encounter (Signed)
I called Dr Simonne Come office at [facility] in reference to [name]'s appt that was supposed to be this afternoon to address her right shoulder pain.  Unfortunately, due to missing two appts, she no longer has the appt this afternoon.  I informed them about her opioid dependence issues and that she is now to be off all controlled medications and also that she's had recent GI bleeding so cannot receive nsaids either.  Staff was notifying Dr. Jacqulyn Bath so she is aware for future reference and notating it in her chart at their office.

## 2016-05-03 ENCOUNTER — Encounter: Payer: Self-pay | Admitting: *Deleted

## 2016-05-07 ENCOUNTER — Other Ambulatory Visit: Payer: Self-pay | Admitting: *Deleted

## 2016-05-07 MED ORDER — METOPROLOL TARTRATE 50 MG PO TABS: 100.0000 mg | ORAL_TABLET | Freq: Two times a day (BID) | ORAL | 1 refills | Status: DC

## 2016-05-07 NOTE — Telephone Encounter (Signed)
Patient requested updated Rx to be faxed to [facility].

## 2016-05-10 ENCOUNTER — Ambulatory Visit: Payer: Self-pay

## 2016-05-17 ENCOUNTER — Encounter: Payer: Self-pay | Admitting: Gastroenterology

## 2016-05-17 DIAGNOSIS — K2211 Ulcer of esophagus with bleeding: Secondary | ICD-10-CM | POA: Insufficient documentation

## 2016-05-17 DIAGNOSIS — Z1211 Encounter for screening for malignant neoplasm of colon: Secondary | ICD-10-CM | POA: Insufficient documentation

## 2016-05-17 DIAGNOSIS — K269 Duodenal ulcer, unspecified as acute or chronic, without hemorrhage or perforation: Secondary | ICD-10-CM | POA: Insufficient documentation

## 2016-05-17 DIAGNOSIS — R195 Other fecal abnormalities: Secondary | ICD-10-CM | POA: Insufficient documentation

## 2016-05-17 NOTE — Progress Notes (Signed)
05/01/2016 [name] [id number] [birth year]  CC:  Black stools  HISTORY OF PRESENT ILLNESS:  This is a pleasant 75 year old female who was seen by Dr. Olevia Perches apparently many years ago.  She was admitted to the hospital in [facility] from August 11 through August 18 for a UGI bleed. At that time she was transfused with 2 units of packed red blood cells for hemoglobin of 6.4 g. She underwent EGD with findings of bleeding esophageal ulcer which was cauterized there is also note of at least 2 duodenal ulcers with clean bases.  This was performed by Dr. Alonza Bogus.  Gastric biopsies at that time showed benign gastric mucosa with focal hyperplasia but no significant inflammation or H. pylori seen. She had been using NSAID's since she was "detoxing" from her opioid pain medication.  Her discharge hemoglobin was 7.6 g. She was told to avoid NSAIDs and to follow up for repeat EGD. She apparently has seen Dr. Olevia Perches here in the past so decided to come here instead.  She has not undergone colonoscopy in several years.  She actually comes back here today stating that she has been seeing black stools again for about 1.5 months Other than that she feels well. [name] on ferrous sulfate 325 mg twice daily. Also takes Protonix 40 mg daily.  Labs performed just yesterday show that her hemoglobin has returned to normal from 12.4 g. This is in comparison to most recent prior to that was September 25 at which time it was 11.7 g. Yesterday BMP was normal as well as.  She does also report that she saw some bright red blood a couple of days ago as well that she contributed to a hemorrhoid.   Past Medical History:  Diagnosis Date  . Anxiety   . Arthritis    djd  . Cataracts, bilateral    immature   . Cellulitis    10/11/11 hospitalized for cellulitis   . Chronic back pain    scoliosis/stenosis  . Depression    takes Zoloft daily  . Depression   . Dysrhythmia    hx tachy palpitations  . Elbow  fracture, left April 2015  . GERD (gastroesophageal reflux disease)    hx pud '98  . Headache(784.0)    migraines yrs ago  . History of bronchitis    many yrs ago  . Hyperlipemia    takes Atorvastatin daily  . Hypertension    takes HCTZ daily  . Insomnia   . Joint pain   . Joint swelling   . Neuromuscular disorder ([place])    peripheral neuropathy, FEET AND LEGS  . Peripheral neuropathy ([place])   . Peripheral neuropathy (HCC)    takes Gabapentin daily  . 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 Radiculopathy, lumbar region   . Restless leg syndrome    takes Sinemet daily  . Stroke [facility])    ? 6 months ago - tests okay - [facility]   . Vertigo    Past Surgical History:  Procedure Laterality Date  . ABDOMINAL HYSTERECTOMY  1975   bil oophorectomy  . BACK SURGERY   MANY YRS AGO   laminectomy x3  . CARPAL TUNNEL RELEASE  07/09/2012   Procedure: CARPAL TUNNEL RELEASE;  Surgeon: Magnus Sinning, MD;  [facility];  Service: Orthopedics;  Laterality: Left;  . cervical disc surgery x2  2011  . CHOLECYSTECTOMY  1993  . colonosocpy    .  ESOPHAGOGASTRODUODENOSCOPY    . FINGER ARTHROPLASTY  07/09/2012   Procedure: FINGER ARTHROPLASTY;  Surgeon: Magnus Sinning, MD;  [facility];  Service: Orthopedics;  Laterality: Left;  Interposition Arthroplasty CMC Joint Thumb Left   . HEMATOMA EVACUATION  2011   s/p rt cea  . JOINT REPLACEMENT  '01   total knee replacement, LEFT  . left knee arthroscopy  2001  . LIPOMA EXCISION  07/09/2012   Procedure: EXCISION LIPOMA;  Surgeon: Magnus Sinning, MD;  [facility];  Service: Orthopedics;  Laterality: Left;  Excision Lipoma Dorsum Left Wrist   . LUMBAR LAMINECTOMY WITH COFLEX 1 LEVEL Bilateral 04/19/2015   Procedure: LUMBAR TWO-THREE LUMBAR LAMINECTOMY WITH COFLEX ;  Surgeon: Kristeen Miss, MD;  Location: [place] NEUR[facility] ORS;  Service: Neurosurgery;  Laterality: Bilateral;  Bilateral L23 laminectomy and foraminotomy with coflex  . LUMBAR  LAMINECTOMY/DECOMPRESSION MICRODISCECTOMY Right 12/19/2015   Procedure: Right Lumbar two-three  Microdiskectomy;  Surgeon: Kristeen Miss, MD;  Location: [place] [facility] ORS;  Service: Neurosurgery;  Laterality: Right;  . ORIF ANKLE FRACTURE Right 09/14/2014   FIBULA   . ORIF ANKLE FRACTURE Right 09/14/2014   Procedure: OPEN REDUCTION INTERNAL FIXATION (ORIF) RIGHT ANKLE FRACTURE/SYNDESMOSIS ;  Surgeon: Wylene Simmer, MD;  Location: [place];  Service: Orthopedics;  Laterality: Right;  . right knee replacement     09/2011   . rt carotid enarterectomy  2011  . rt knee arthroscopy  '99  . TOTAL KNEE ARTHROPLASTY  09/13/2011   Procedure: TOTAL KNEE ARTHROPLASTY;  Surgeon: Magnus Sinning, MD;  [facility];  Service: Orthopedics;  Laterality: Right;    reports that she has never smoked. She has never used smokeless tobacco. She reports that she does not drink alcohol or use drugs. family history includes Alzheimer's disease in her mother; Arthritis in her brother; Cancer in her brother and brother; Congestive Heart Failure in her father and sister; Diabetes in her brother and brother; Prostate cancer in her brother; Testicular cancer in her brother. Allergies  Allergen Reactions  . Contrast Media [Iodinated Diagnostic Agents] Anaphylaxis  . Acyclovir And Related Other (See Comments)    Reaction:  Unknown   . Sulfa Drugs Cross Reactors Nausea And Vomiting      Outpatient Encounter Prescriptions as of 05/01/2016  Medication Sig  . acetaminophen (TYLENOL) 500 MG tablet Take 500 mg by mouth every 8 (eight) hours as needed for moderate pain.  Marland Kitchen atorvastatin (LIPITOR) 40 MG tablet TAKE 1 TABLET BY MOUTH ONCE DAILY FOR CHOLESTEROL  . cholecalciferol (VITAMIN D) 1000 units tablet Take 1,000 Units by mouth daily.  . ferrous sulfate 325 (65 FE) MG EC tablet Take 1 tablet (325 mg total) by mouth 2 (two) times daily.  Marland Kitchen gabapentin (NEURONTIN) 400 MG capsule Take 400 mg by mouth 4 (four) times daily.  . Liniments  (SALONPAS EX) Apply topically as needed.  Marland Kitchen lisinopril (PRINIVIL,ZESTRIL) 5 MG tablet Take 5 mg by mouth daily.  . pantoprazole (PROTONIX) 40 MG tablet Take 1 tablet (40 mg total) by mouth daily.  Marland Kitchen pyridOXINE (VITAMIN B-6) 50 MG tablet Take 50 mg by mouth daily.  . sertraline (ZOLOFT) 50 MG tablet TAKE TWO TABLETS BY MOUTH  ONCE DAILY FOR DEPRESSION  . sodium chloride (OCEAN) 0.65 % SOLN nasal spray Place 1 spray into both nostrils as needed for congestion.  . SUMAtriptan (IMITREX) 50 MG tablet Take 1 tablet (50 mg total) by mouth every 2 (two) hours as needed for migraine. May repeat in  once if headache persists or recurs.  . [DISCONTINUED] metoprolol (LOPRESSOR) 50 MG tablet Take 100 mg by mouth 2 (two) times daily.  . [EXPIRED] Na Sulfate-K Sulfate-Mg Sulf 17.5-3.13-1.6 GM/180ML SOLN Take 1 kit by mouth once.  . [DISCONTINUED] docusate sodium (COLACE) 100 MG capsule Take 100 mg by mouth daily as needed for mild constipation.   No facility-administered encounter medications on file as of 05/01/2016.      REVIEW OF SYSTEMS  : All other systems reviewed and negative except where noted in the History of Present Illness.   PHYSICAL EXAM: BP (!) 188/90   Pulse 88   Ht _0  (1.651 m)   Wt 187 lb 8 oz (85 kg)   BMI 31.20 kg/m  General: Well developed white female in no acute distress Head: Normocephalic and atraumatic Eyes:  Sclerae anicteric, conjunctiva pink. Ears: Normal auditory acuity. Lungs: Clear throughout to auscultation Heart: Regular rate and rhythm Abdomen: Soft, non-distended.  BS present.  Non-tender. Rectal:  Dark brown stool on exam glove, but it was strongly heme positive.  No masses felt on DRE. Musculoskeletal: Symmetrical with no gross deformities  Skin: No lesions on visible extremities Extremities: No edema  Neurological: Alert oriented x 4, grossly non-focal Psychological:  Alert and cooperative. Normal mood and affect  ASSESSMENT AND PLAN: -75 year old  female who had an UGIB due to an esophageal ulcer that required cautery and some clean-based duodenal ulcers on an EGD in August. [name] reporting black stools again for about 1.5 months.  [name] on iron supplements. Her hemoglobin yesterday is normal and actually improved even compared to prior. On rectal exam today she had dark brown stool, but it was strongly Hemoccult positive.  I do not think that we routinely follow-up esophageal ulcers or duodenal ulcers with repeat EGD, and I am not convinced that she is having an UGIB.  Has been off of NSAID's, which she was using previously.  I think that the black stools may be from her iron.  She also reports a recent episode of bright red rectal bleeding, which may be something hemorrhoidal/colonic that is causing the hemoccult positive stool.  I think that she needs colonoscopy.  Needs to continue Protonix 40 mg daily and continue to avoid NSAIDs. -Screening colonoscopy:  Will schedule colonoscopy as above.  *The risks, benefits, and alternatives to colonoscopy were discussed with the patient and she consents to proceed.   CC:  [name], [name] L, DO

## 2016-05-18 ENCOUNTER — Encounter: Payer: Self-pay | Admitting: Gastroenterology

## 2016-05-18 NOTE — Progress Notes (Signed)
Agree with note as outlined. Do we have the EGD note that I can review by any chance? Not sure why they are recommending a follow up EGD based on your report, however would like to clarify as if we are proceeding with colonoscopy and if she needs an EGD we can do it at the same time. Thanks

## 2016-05-19 ENCOUNTER — Other Ambulatory Visit: Payer: Self-pay | Admitting: Internal Medicine

## 2016-05-24 ENCOUNTER — Telehealth: Payer: Self-pay | Admitting: Gastroenterology

## 2016-05-24 NOTE — Telephone Encounter (Signed)
I have had a chance to review prior EGD for GI bleeding done by another prior. Severe LA grade D esophagitis with large ulceration, as well as clean based duodenal ulcer. They had recommended a repeat EGD while on PPI, I suspect to ensure no Barrett's or nodularity given the significant inflammatory changes.   Almyra Free this patient is currently scheduled for colonoscopy only. Can you please add EGD to this procedure to make it a double. I am okay keeping both at same time frame on same day as most other procedures that afternoon are EGDs, I think we can do both if okay with [facility]. Thanks

## 2016-05-25 ENCOUNTER — Other Ambulatory Visit: Payer: Self-pay

## 2016-05-25 ENCOUNTER — Telehealth: Payer: Self-pay

## 2016-05-25 DIAGNOSIS — Z8719 Personal history of other diseases of the digestive system: Secondary | ICD-10-CM

## 2016-05-25 NOTE — Telephone Encounter (Signed)
Spoke to patient to let her know that Dr. Havery Moros has reviewed her previous reports and would like to add on the EGD to do at the same time as colonoscopy scheduled for 06/05/16. Patient is agreeable to this plan. I let her know that I would send the consent for the EGD to her and to please bring signed/dated consent back to our office. [facility] aware that this is now a double procedure.

## 2016-05-25 NOTE — Telephone Encounter (Signed)
-----   Message from Laverna Peace, RN sent at 05/24/2016  4:48 PM EST ----- Yes, its' fine ----- Message ----- From: Doristine Counter, RN Sent: 05/24/2016   3:33 PM To: Laverna Peace, RN  Please see Dr. Doyne Keel note from 12/21 re: adding on an EGD, so it would be a double.  Please let me know if this will be okay. Thanks, Almyra Free

## 2016-05-25 NOTE — Progress Notes (Signed)
EGD prep instructions mailed.

## 2016-05-25 NOTE — Telephone Encounter (Signed)
Prep instructions mailed to patient for EGD, asked that she follow the colonoscopy instructions she had received previously. Asked that if she has a CPAP machine for sleep apnea, to bring it with her day of procedure, if possible. Mailed consent for EGD, asked that she drop this off at our office prior to 06/05/16.

## 2016-05-29 ENCOUNTER — Telehealth: Payer: Self-pay | Admitting: Gastroenterology

## 2016-05-30 NOTE — Telephone Encounter (Signed)
Saw where patient rescheduled again, mailed new info and consent for EGD. Asked that she sign consent and either mail it back or drop it off at our front desk on 3rd floor.

## 2016-06-05 ENCOUNTER — Encounter: Payer: Self-pay | Admitting: Gastroenterology

## 2016-06-11 ENCOUNTER — Ambulatory Visit (INDEPENDENT_AMBULATORY_CARE_PROVIDER_SITE_OTHER): Payer: Medicare Other

## 2016-06-11 ENCOUNTER — Ambulatory Visit (INDEPENDENT_AMBULATORY_CARE_PROVIDER_SITE_OTHER): Payer: Medicare Other | Admitting: Orthopedic Surgery

## 2016-06-11 VITALS — Ht 65.0 in | Wt 187.0 lb

## 2016-06-11 DIAGNOSIS — M7541 Impingement syndrome of right shoulder: Secondary | ICD-10-CM | POA: Insufficient documentation

## 2016-06-11 DIAGNOSIS — M25511 Pain in right shoulder: Secondary | ICD-10-CM

## 2016-06-11 DIAGNOSIS — G8929 Other chronic pain: Secondary | ICD-10-CM

## 2016-06-11 MED ORDER — LIDOCAINE HCL 1 % IJ SOLN
5.0000 mL | INTRAMUSCULAR | Status: AC | PRN
  Administered 2016-06-11: 5 mL

## 2016-06-11 MED ORDER — METHYLPREDNISOLONE ACETATE 40 MG/ML IJ SUSP
40.0000 mg | INTRAMUSCULAR | Status: AC | PRN
  Administered 2016-06-11: 40 mg via INTRA_ARTICULAR

## 2016-06-11 NOTE — Progress Notes (Signed)
Office Visit Note   Patient: [name]           Date of Birth: Feb 25, 1941           MRN: [id number] Visit Date: 06/11/2016              Requested by: [name], DO [place], [place] [address] PCP: Hollace Kinnier, DO  No chief complaint on file.   HPI: Patient states that she fell about 2 months ago. She states that that it was pain and it would come and go. No its "getting really bad" and not going away. Decreased ROM "getting undressed and taking off shirts has been difficult" painful to sleep on this side and has difficulty sleeping. Does have throbbing pain and this makes it hard to sleep. Autumn L Forrest, RMA  patient denies any radicular symptoms. Pain with overhead activities with her right shoulder.  Patient states she's had 5 lumbar spine surgeries with Dr. Ellene Route and 2 cervical spine surgeries.  Assessment & Plan: Visit Diagnoses:  1. Chronic right shoulder pain   2. Impingement syndrome of right shoulder     Plan: patient's right shoulder was injected in the subacromial space without complications follow-up in 4 weeks. Discussed that if she has temporary relief she most likely has impingement rotator cuff pathology without relief this may be an intra-articular problem. Also discussed that she could be having some symptoms from her cervical spine.  Follow-Up Instructions: Return in about 4 weeks (around 07/09/2016).   Ortho Exam On examination patient is alert oriented no adenopathy well-dressed the left rectum was transferred she is a cane for ambulation. Examination she has abduction and flexion of only 90 of the right shoulder she has pain with Neer and Hawkins impingement test pain with drop arm test right shoulder. Biceps tendon is tender to palpation the cervical spine and thoracic outlet are nontender to palpation she has no focal motor weakness.  Imaging: Xr Shoulder Right  Result Date: 06/11/2016 Three-view radiographs of the right shoulder  shows superior migration of the humeral head within the glenoid. The lung fields are clear. Patient has failed cervical spine hardware    Orders:  Orders Placed This Encounter  Procedures  . XR Shoulder Right   No orders of the defined types were placed in this encounter.    Procedures: Large Joint Inj Date/Time: 06/11/2016 3:03 PM Performed by: DUDA, MARCUS V Authorized by: Newt Minion   Consent Given by:  Patient Site marked: the procedure site was marked   Timeout: prior to procedure the correct patient, procedure, and site was verified   Indications:  Pain and diagnostic evaluation Location:  Shoulder Site:  R subacromial bursa Prep: patient was prepped and draped in usual sterile fashion   Needle Size:  22 G Needle Length:  1.5 inches Approach:  Posterior Ultrasound Guidance: No   Fluoroscopic Guidance: No   Arthrogram: No   Medications:  5 mL lidocaine 1 %; 40 mg methylPREDNISolone acetate 40 MG/ML Aspiration Attempted: No   Patient tolerance:  Patient tolerated the procedure well with no immediate complications    Clinical Data: No additional findings.  Subjective: Review of Systems  Objective: Vital Signs: Ht 5\' 5"  (1.651 m)   Wt 187 lb (84.8 kg)   BMI 31.12 kg/m   Specialty Comments:  No specialty comments available.  PMFS History: Patient Active Problem List   Diagnosis Date Noted  . Impingement syndrome of right shoulder 06/11/2016  .  Colon cancer screening 05/17/2016  . Heme positive stool 05/17/2016  . Esophageal ulcer with bleeding 05/17/2016  . Duodenal ulcer 05/17/2016  . Acute blood loss anemia 01/18/2016  . Herniated nucleus pulposus, L2-3 right 12/19/2015  . Right foot pain 07/22/2015  . Chest pain 07/11/2015  . History of drug overdose 07/07/2015  . Frequent falls 07/07/2015  . Hyperlipidemia 07/07/2015  . Essential hypertension 07/07/2015  . Spinal stenosis, lumbar region, with neurogenic claudication 07/07/2015  . Severe  episode of recurrent major depressive disorder, with psychotic features ([place]) 07/07/2015  . Visual hallucination 07/01/2015  . Delirium   . Severe episode of recurrent major depressive disorder, without psychotic features ([place])   . Back pain 06/20/2015  . MDD (major depressive disorder), recurrent episode, severe ([place]) 06/16/2015  . Lumbar stenosis 04/19/2015  . Hypokalemia 09/19/2014  . Constipation 09/19/2014  . Acute encephalopathy 09/18/2014  . Acute respiratory failure ([place]) 09/17/2014  . OSA (obstructive sleep apnea) 09/17/2014  . Ankle fracture, lateral malleolus, closed 09/14/2014  . Major depressive disorder, recurrent severe without psychotic features ([place]) 08/07/2014  . Insomnia 08/05/2014  . Hip joint painful on movement 08/05/2014  . Degenerative disc disease, lumbar 08/05/2014  . Severe obesity (BMI >= 40) ([place]) 01/11/2014  . Depression   . Arthritis   . Polyneuropathy in other diseases classified elsewhere ([place]) 04/08/2013  . Restless legs syndrome (RLS) 04/08/2013  . UTI (lower urinary tract infection) 10/14/2011  . Dizziness 10/12/2011  . Fall 10/12/2011  . TIA (transient ischemic attack) 10/12/2011  . Cellulitis 10/12/2011  . GERD (gastroesophageal reflux disease) 10/12/2011   Past Medical History:  Diagnosis Date  . Anxiety   . Arthritis    djd  . Cataracts, bilateral    immature   . Cellulitis    10/11/11 hospitalized for cellulitis   . Chronic back pain    scoliosis/stenosis  . Depression    takes Zoloft daily  . Depression   . Dysrhythmia    hx tachy palpitations  . Elbow fracture, left April 2015  . GERD (gastroesophageal reflux disease)    hx pud '98  . Headache(784.0)    migraines yrs ago  . History of bronchitis    many yrs ago  . Hyperlipemia    takes Atorvastatin daily  . Hypertension    takes HCTZ daily  . Insomnia   . Joint pain   . Joint swelling   . Neuromuscular disorder ([place])    peripheral neuropathy, FEET AND LEGS  .  Peripheral neuropathy ([place])   . Peripheral neuropathy (HCC)    takes Gabapentin daily  . 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 Radiculopathy, lumbar region   . Restless leg syndrome    takes Sinemet daily  . Stroke [facility])    ? 6 months ago - tests okay - [facility]   . Vertigo     Family History  Problem Relation Age of Onset  . Congestive Heart Failure Father   . Congestive Heart Failure Sister   . Alzheimer's disease Mother   . Diabetes Brother   . Cancer Brother   . Testicular cancer Brother   . Arthritis Brother   . Cancer Brother   . Diabetes Brother   . Prostate cancer Brother     Past Surgical History:  Procedure Laterality Date  . ABDOMINAL HYSTERECTOMY  1975   bil oophorectomy  . BACK SURGERY   MANY YRS AGO   laminectomy x3  . CARPAL  TUNNEL RELEASE  07/09/2012   Procedure: CARPAL TUNNEL RELEASE;  Surgeon: Magnus Sinning, MD;  [facility];  Service: Orthopedics;  Laterality: Left;  . cervical disc surgery x2  2011  . CHOLECYSTECTOMY  1993  . colonosocpy    . ESOPHAGOGASTRODUODENOSCOPY    . FINGER ARTHROPLASTY  07/09/2012   Procedure: FINGER ARTHROPLASTY;  Surgeon: Magnus Sinning, MD;  [facility];  Service: Orthopedics;  Laterality: Left;  Interposition Arthroplasty CMC Joint Thumb Left   . HEMATOMA EVACUATION  2011   s/p rt cea  . JOINT REPLACEMENT  '01   total knee replacement, LEFT  . left knee arthroscopy  2001  . LIPOMA EXCISION  07/09/2012   Procedure: EXCISION LIPOMA;  Surgeon: Magnus Sinning, MD;  [facility];  Service: Orthopedics;  Laterality: Left;  Excision Lipoma Dorsum Left Wrist   . LUMBAR LAMINECTOMY WITH COFLEX 1 LEVEL Bilateral 04/19/2015   Procedure: LUMBAR TWO-THREE LUMBAR LAMINECTOMY WITH COFLEX ;  Surgeon: Kristeen Miss, MD;  Location: [place] [facility];  Service: Neurosurgery;  Laterality: Bilateral;  Bilateral L23 laminectomy and foraminotomy with coflex  . LUMBAR LAMINECTOMY/DECOMPRESSION MICRODISCECTOMY Right  12/19/2015   Procedure: Right Lumbar two-three  Microdiskectomy;  Surgeon: Kristeen Miss, MD;  Location: [place] [facility];  Service: Neurosurgery;  Laterality: Right;  . ORIF ANKLE FRACTURE Right 09/14/2014   FIBULA   . ORIF ANKLE FRACTURE Right 09/14/2014   Procedure: OPEN REDUCTION INTERNAL FIXATION (ORIF) RIGHT ANKLE FRACTURE/SYNDESMOSIS ;  Surgeon: Wylene Simmer, MD;  Location: [place];  Service: Orthopedics;  Laterality: Right;  . right knee replacement     09/2011   . rt carotid enarterectomy  2011  . rt knee arthroscopy  '99  . TOTAL KNEE ARTHROPLASTY  09/13/2011   Procedure: TOTAL KNEE ARTHROPLASTY;  Surgeon: Magnus Sinning, MD;  [facility];  Service: Orthopedics;  Laterality: Right;   Social History   Occupational History  . Not on file.   Social History Main Topics  . Smoking status: Never Smoker  . Smokeless tobacco: Never Used  . Alcohol use No  . Drug use: No  . Sexual activity: No

## 2016-06-12 ENCOUNTER — Telehealth: Payer: Self-pay | Admitting: *Deleted

## 2016-06-12 MED ORDER — METOPROLOL TARTRATE 50 MG PO TABS: 150.0000 mg | ORAL_TABLET | Freq: Two times a day (BID) | ORAL | 1 refills | Status: DC

## 2016-06-12 NOTE — Telephone Encounter (Signed)
Patient son notified and agreed. Will adjust pill box and needs updated Rx faxed to [facility].  Patient notified also and agreed.

## 2016-06-12 NOTE — Telephone Encounter (Signed)
Ok.  It was not below 150/90 before which is why I doubled her dose.   let's increase her to lopressor 150mg  po bid (probably requires she take two pills at once--100mg  and 50mg ).  Her son will need to be informed--his wife fills her pillbox.  Patient herself will not remember and will not make changes.

## 2016-06-12 NOTE — Telephone Encounter (Signed)
Patient called and stated that every since on Lopressor 100mg  twice daily she cannot get her BP below 150/90. Please Advise.

## 2016-06-17 ENCOUNTER — Emergency Department (HOSPITAL_COMMUNITY): Payer: Medicare Other

## 2016-06-17 ENCOUNTER — Encounter (HOSPITAL_COMMUNITY): Payer: Self-pay | Admitting: Emergency Medicine

## 2016-06-17 ENCOUNTER — Emergency Department (HOSPITAL_COMMUNITY)
Admission: EM | Admit: 2016-06-17 | Discharge: 2016-06-17 | Disposition: A | Discharge status: Home / Self Care | Payer: Medicare Other | Attending: Emergency Medicine | Admitting: Emergency Medicine

## 2016-06-17 DIAGNOSIS — Z8673 Personal history of transient ischemic attack (TIA), and cerebral infarction without residual deficits: Secondary | ICD-10-CM | POA: Insufficient documentation

## 2016-06-17 DIAGNOSIS — I10 Essential (primary) hypertension: Secondary | ICD-10-CM | POA: Diagnosis not present

## 2016-06-17 DIAGNOSIS — Z7982 Long term (current) use of aspirin: Secondary | ICD-10-CM | POA: Diagnosis not present

## 2016-06-17 DIAGNOSIS — R519 Headache, unspecified: Secondary | ICD-10-CM

## 2016-06-17 DIAGNOSIS — Z96653 Presence of artificial knee joint, bilateral: Secondary | ICD-10-CM | POA: Insufficient documentation

## 2016-06-17 DIAGNOSIS — M25511 Pain in right shoulder: Secondary | ICD-10-CM | POA: Diagnosis not present

## 2016-06-17 DIAGNOSIS — T148XXA Other injury of unspecified body region, initial encounter: Secondary | ICD-10-CM | POA: Diagnosis not present

## 2016-06-17 DIAGNOSIS — R51 Headache: Secondary | ICD-10-CM | POA: Diagnosis not present

## 2016-06-17 DIAGNOSIS — G4489 Other headache syndrome: Secondary | ICD-10-CM | POA: Diagnosis not present

## 2016-06-17 LAB — CBC WITH DIFFERENTIAL/PLATELET
Basophils Absolute: 0 10*3/uL (ref 0.0–0.1)
Basophils Relative: 0 %
EOS ABS: 0.1 10*3/uL (ref 0.0–0.7)
Eosinophils Relative: 1 %
HCT: 49.1 % — ABNORMAL HIGH (ref 36.0–46.0)
HEMOGLOBIN: 16.2 g/dL — AB (ref 12.0–15.0)
LYMPHS ABS: 0.8 10*3/uL (ref 0.7–4.0)
Lymphocytes Relative: 6 %
MCH: 34.2 pg — AB (ref 26.0–34.0)
MCHC: 33 g/dL (ref 30.0–36.0)
MCV: 103.8 fL — ABNORMAL HIGH (ref 78.0–100.0)
Monocytes Absolute: 0.7 10*3/uL (ref 0.1–1.0)
Monocytes Relative: 5 %
NEUTROS ABS: 12.6 10*3/uL — AB (ref 1.7–7.7)
NEUTROS PCT: 88 %
Platelets: 259 10*3/uL (ref 150–400)
RBC: 4.73 MIL/uL (ref 3.87–5.11)
RDW: 15 % (ref 11.5–15.5)
WBC: 14.2 10*3/uL — AB (ref 4.0–10.5)

## 2016-06-17 LAB — BASIC METABOLIC PANEL
Anion gap: 13 (ref 5–15)
BUN: 18 mg/dL (ref 6–20)
CHLORIDE: 104 mmol/L (ref 101–111)
CO2: 20 mmol/L — ABNORMAL LOW (ref 22–32)
Calcium: 9.6 mg/dL (ref 8.9–10.3)
Creatinine, Ser: 0.58 mg/dL (ref 0.44–1.00)
GFR calc non Af Amer: >60 mL/min (ref >60)
Glucose, Bld: 124 mg/dL — ABNORMAL HIGH (ref 65–99)
Potassium: 3.9 mmol/L (ref 3.5–5.1)
SODIUM: 137 mmol/L (ref 135–145)

## 2016-06-17 MED ORDER — ONDANSETRON HCL 4 MG/2ML IJ SOLN
4.0000 mg | Freq: Once | INTRAMUSCULAR | Status: AC
  Administered 2016-06-17: 4 mg via INTRAVENOUS
  Filled 2016-06-17: qty 2

## 2016-06-17 MED ORDER — SUMATRIPTAN SUCCINATE 50 MG PO TABS: 50.0000 mg | ORAL_TABLET | ORAL | 0 refills | Status: DC | PRN

## 2016-06-17 MED ORDER — LABETALOL HCL 5 MG/ML IV SOLN
10.0000 mg | Freq: Once | INTRAVENOUS | Status: AC
  Administered 2016-06-17: 10 mg via INTRAVENOUS
  Filled 2016-06-17: qty 4

## 2016-06-17 MED ORDER — SUMATRIPTAN SUCCINATE 50 MG PO TABS
50.0000 mg | ORAL_TABLET | Freq: Once | ORAL | Status: AC
  Administered 2016-06-17: 50 mg via ORAL
  Filled 2016-06-17: qty 1

## 2016-06-17 MED ORDER — FENTANYL CITRATE (PF) 100 MCG/2ML IJ SOLN
50.0000 ug | Freq: Once | INTRAMUSCULAR | Status: AC
  Administered 2016-06-17: 50 ug via INTRAVENOUS
  Filled 2016-06-17: qty 2

## 2016-06-17 NOTE — ED Triage Notes (Addendum)
N/v/d since 3 am and she has had rt shoulder pain, they have changed around her bp meds due to her bp  Being so high, she has a h/a

## 2016-06-17 NOTE — ED Provider Notes (Signed)
[facility] DEPT Provider Note   CSN: [id number] Arrival date & time: 06/17/16  [id number]  History   Chief Complaint Chief Complaint  Patient presents with  . Emesis  . Headache   HPI [name] is a 76 y.o. female.  HPI  Patient has a care plan for frequent pain related visit. Usually back pain.  She has a PMH of cellulitis, anxiety, hypertension, depression, chronic back pain, headaches, stroke, comes to the ER for multiple complaints.  She reports 1 week of hypertension close to the [id number] systolic, on [id number] mg Lopressor BID and compliant, headache for same amount of time. Yesterday developed a band like headache with nausea, vomiting and diarrhea. [name] hypotensive but awake, alert and oriented with no obvious neuro deficits. She has not had fevers, neck pain, abdominal pain or weakness.  Past Medical History:  Diagnosis Date  . Anxiety   . Arthritis    djd  . Cataracts, bilateral    immature   . Cellulitis    10/11/11 hospitalized for cellulitis   . Chronic back pain    scoliosis/stenosis  . Depression    takes Zoloft daily  . Depression   . Dysrhythmia    hx tachy palpitations  . Elbow fracture, left April 2015  . GERD (gastroesophageal reflux disease)    hx pud '98  . Headache(784.0)    migraines yrs ago  . History of bronchitis    many yrs ago  . Hyperlipemia    takes Atorvastatin daily  . Hypertension    takes HCTZ daily  . Insomnia   . Joint pain   . Joint swelling   . Neuromuscular disorder ([place])    peripheral neuropathy, FEET AND LEGS  . Peripheral neuropathy ([place])   . Peripheral neuropathy (HCC)    takes Gabapentin daily  . 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 Radiculopathy, lumbar region   . Restless leg syndrome    takes Sinemet daily  . Stroke [facility])    ? 6 months ago - tests okay - [facility]   . Vertigo     Patient Active Problem List   Diagnosis Date Noted  . Impingement syndrome of right shoulder 06/11/2016  . Colon cancer  screening 05/17/2016  . Heme positive stool 05/17/2016  . Esophageal ulcer with bleeding 05/17/2016  . Duodenal ulcer 05/17/2016  . Acute blood loss anemia 01/18/2016  . Herniated nucleus pulposus, L2-3 right 12/19/2015  . Right foot pain 07/22/2015  . Chest pain 07/11/2015  . History of drug overdose 07/07/2015  . Frequent falls 07/07/2015  . Hyperlipidemia 07/07/2015  . Essential hypertension 07/07/2015  . Spinal stenosis, lumbar region, with neurogenic claudication 07/07/2015  . Severe episode of recurrent major depressive disorder, with psychotic features ([place]) 07/07/2015  . Visual hallucination 07/01/2015  . Delirium   . Severe episode of recurrent major depressive disorder, without psychotic features ([place])   . Back pain 06/20/2015  . MDD (major depressive disorder), recurrent episode, severe ([place]) 06/16/2015  . Lumbar stenosis 04/19/2015  . Hypokalemia 09/19/2014  . Constipation 09/19/2014  . Acute encephalopathy 09/18/2014  . Acute respiratory failure ([place]) 09/17/2014  . OSA (obstructive sleep apnea) 09/17/2014  . Ankle fracture, lateral malleolus, closed 09/14/2014  . Major depressive disorder, recurrent severe without psychotic features ([place]) 08/07/2014  . Insomnia 08/05/2014  . Hip joint painful on movement 08/05/2014  . Degenerative disc disease, lumbar 08/05/2014  . Severe obesity (BMI >= 40) ([place]) 01/11/2014  .  Depression   . Arthritis   . Polyneuropathy in other diseases classified elsewhere ([place]) 04/08/2013  . Restless legs syndrome (RLS) 04/08/2013  . UTI (lower urinary tract infection) 10/14/2011  . Dizziness 10/12/2011  . Fall 10/12/2011  . TIA (transient ischemic attack) 10/12/2011  . Cellulitis 10/12/2011  . GERD (gastroesophageal reflux disease) 10/12/2011    Past Surgical History:  Procedure Laterality Date  . ABDOMINAL HYSTERECTOMY  1975   bil oophorectomy  . BACK SURGERY   MANY YRS AGO   laminectomy x3  . CARPAL TUNNEL RELEASE  07/09/2012    Procedure: CARPAL TUNNEL RELEASE;  Surgeon: Magnus Sinning, MD;  [facility];  Service: Orthopedics;  Laterality: Left;  . cervical disc surgery x2  2011  . CHOLECYSTECTOMY  1993  . colonosocpy    . ESOPHAGOGASTRODUODENOSCOPY    . FINGER ARTHROPLASTY  07/09/2012   Procedure: FINGER ARTHROPLASTY;  Surgeon: Magnus Sinning, MD;  [facility];  Service: Orthopedics;  Laterality: Left;  Interposition Arthroplasty CMC Joint Thumb Left   . HEMATOMA EVACUATION  2011   s/p rt cea  . JOINT REPLACEMENT  '01   total knee replacement, LEFT  . left knee arthroscopy  2001  . LIPOMA EXCISION  07/09/2012   Procedure: EXCISION LIPOMA;  Surgeon: Magnus Sinning, MD;  [facility];  Service: Orthopedics;  Laterality: Left;  Excision Lipoma Dorsum Left Wrist   . LUMBAR LAMINECTOMY WITH COFLEX 1 LEVEL Bilateral 04/19/2015   Procedure: LUMBAR TWO-THREE LUMBAR LAMINECTOMY WITH COFLEX ;  Surgeon: Kristeen Miss, MD;  Location: [place] [facility];  Service: Neurosurgery;  Laterality: Bilateral;  Bilateral L23 laminectomy and foraminotomy with coflex  . LUMBAR LAMINECTOMY/DECOMPRESSION MICRODISCECTOMY Right 12/19/2015   Procedure: Right Lumbar two-three  Microdiskectomy;  Surgeon: Kristeen Miss, MD;  Location: [place] [facility];  Service: Neurosurgery;  Laterality: Right;  . ORIF ANKLE FRACTURE Right 09/14/2014   FIBULA   . ORIF ANKLE FRACTURE Right 09/14/2014   Procedure: OPEN REDUCTION INTERNAL FIXATION (ORIF) RIGHT ANKLE FRACTURE/SYNDESMOSIS ;  Surgeon: Wylene Simmer, MD;  Location: [place];  Service: Orthopedics;  Laterality: Right;  . right knee replacement     09/2011   . rt carotid enarterectomy  2011  . rt knee arthroscopy  '99  . TOTAL KNEE ARTHROPLASTY  09/13/2011   Procedure: TOTAL KNEE ARTHROPLASTY;  Surgeon: Magnus Sinning, MD;  [facility];  Service: Orthopedics;  Laterality: Right;    OB History    No data available       Home Medications    Prior to Admission medications   Medication  Sig Start Date End Date Taking? Authorizing Provider  acetaminophen (TYLENOL) 500 MG tablet Take 500 mg by mouth every 8 (eight) hours as needed for moderate pain.   Yes Historical Provider, MD  acetaminophen-codeine (TYLENOL #3) 300-30 MG tablet Take 1 tablet by mouth 3 (three) times daily as needed. 06/16/16  Yes Historical Provider, MD  aspirin EC 81 MG tablet Take 81 mg by mouth daily.   Yes Historical Provider, MD  atorvastatin (LIPITOR) 40 MG tablet TAKE 1 TABLET BY MOUTH ONCE DAILY FOR CHOLESTEROL 05/21/16  Yes Cambrea Kirt L [name], DO  cholecalciferol (VITAMIN D) 1000 units tablet Take 1,000 Units by mouth daily.   Yes Historical Provider, MD  ferrous sulfate 325 (65 FE) MG EC tablet Take 1 tablet (325 mg total) by mouth 2 (two) times daily. Patient taking differently: Take 325 mg by mouth every evening.  02/27/16  Yes Lewanna Petrak L  [name], DO  gabapentin (NEURONTIN) 400 MG capsule Take 400 mg by mouth 4 (four) times daily.   Yes Historical Provider, MD  Lin[facility] Xxx EX) Apply topically as needed.   Yes Historical Provider, MD  lisinopril (PRINIVIL,ZESTRIL) 5 MG tablet TAKE 1 TABLET BY MOUTH  DAILY FOR HIGH BLOOD  PRESSURE 05/21/16  Yes Zaidan Keeble L [name], DO  metoprolol (LOPRESSOR) 50 MG tablet Take 3 tablets (150 mg total) by mouth 2 (two) times daily. 06/12/16  Yes Kenshin Splawn L [name], DO  pantoprazole (PROTONIX) 40 MG tablet Take 1 tablet (40 mg total) by mouth daily. 02/27/16  Yes Ramondo Dietze L [name], DO  pyridOXINE (VITAMIN B-6) 50 MG tablet Take 50 mg by mouth daily.   Yes Historical Provider, MD  sertraline (ZOLOFT) 50 MG tablet TAKE TWO TABLETS BY MOUTH  ONCE DAILY FOR DEPRESSION 02/27/16  Yes Winter Jocelyn L [name], DO  sodium chloride (OCEAN) 0.65 % SOLN nasal spray Place 1 spray into both nostrils as needed for congestion. 06/28/15  Yes Encarnacion Slates, NP  SUMAtriptan (IMITREX) 50 MG tablet Take 1 tablet (50 mg total) by mouth every 2 (two) hours as needed for migraine. May repeat in once if headache persists or  recurs. 03/08/16  Yes [place], DO    Family History Family History  Problem Relation Age of Onset  . Congestive Heart Failure Father   . Congestive Heart Failure Sister   . Alzheimer's disease Mother   . Diabetes Brother   . Cancer Brother   . Testicular cancer Brother   . Arthritis Brother   . Cancer Brother   . Diabetes Brother   . Prostate cancer Brother     Social History Social History  Substance Use Topics  . Smoking status: Never Smoker  . Smokeless tobacco: Never Used  . Alcohol use No     Allergies   Contrast media [iodinated diagnostic agents]; Acyclovir and related; and Sulfa drugs cross reactors   Review of Systems Review of Systems Review of Systems All other systems negative except as documented in the HPI. All pertinent positives and negatives as reviewed in the HPI.   Physical Exam Updated Vital Signs BP 124/97   Pulse 97   Temp 99.6 F (37.6 C) (Oral)   Resp 21   SpO2 94%   Physical Exam  Constitutional: She appears well-developed and well-nourished. No distress.  HENT:  Head: Normocephalic and atraumatic.  Right Ear: Tympanic membrane and ear canal normal.  Left Ear: Tympanic membrane and ear canal normal.  Nose: Nose normal.  Mouth/Throat: Uvula is midline, oropharynx is clear and moist and mucous membranes are normal.  Eyes: Pupils are equal, round, and reactive to light.  Neck: Normal range of motion. Neck supple.  Cardiovascular: Normal rate and regular rhythm.   Pulmonary/Chest: Effort normal.  Abdominal: Soft.  No signs of abdominal distention  Musculoskeletal:  No LE swelling  Neurological: [name] alert.  Acting at baseline Cranial nerves grossly intact on exam. Pt alert and oriented x 3 Upper and lower extremity strength is symmetrical and physiologic Normal muscular tone No facial droop Coordination intact, no limb ataxia,No pronator drift  Skin: Skin is warm and dry. No rash noted.  Nursing note and vitals  reviewed.    ED Treatments / Results  Labs (all labs ordered are listed, but only abnormal results are displayed) Labs Reviewed  CBC WITH DIFFERENTIAL/PLATELET - Abnormal; Notable for the following:       Result Value   WBC 14.2 (*)  Hemoglobin 16.2 (*)    HCT 49.1 (*)    MCV 103.8 (*)    MCH 34.2 (*)    Neutro Abs 12.6 (*)    All other components within normal limits  BASIC METABOLIC PANEL - Abnormal; Notable for the following:    CO2 20 (*)    Glucose, Bld 124 (*)    All other components within normal limits    EKG  EKG Interpretation None       Radiology Dg Chest 2 View  Result Date: 06/17/2016 CLINICAL DATA:  Pt c/o n/v/d since ~3am. Severe headache. Recent injection right shoulder; possible right rotator cuff tear. meds htn. Nonsmoker. EXAM: CHEST  2 VIEW COMPARISON:  01/17/2016 FINDINGS: Cardiac silhouette is normal in size and configuration. Normal mediastinal and hilar contours. Clear lungs.  No pleural effusion.  No pneumothorax. Mild elevation the right hemidiaphragm, stable. Skeletal structures are demineralized but intact. IMPRESSION: No active cardiopulmonary disease. Electronically Signed   By: Lajean Manes M.D.   On: 06/17/2016 13:10   Ct Head Wo Contrast  Result Date: 06/17/2016 CLINICAL DATA:  Ct head wo, severe headaches, high bp, vomiting, no injury. EXAM: CT HEAD WITHOUT CONTRAST TECHNIQUE: Contiguous axial images were obtained from the base of the skull through the vertex without intravenous contrast. COMPARISON:  07/01/2015 FINDINGS: Brain: No evidence of acute infarction, hemorrhage, hydrocephalus, extra-axial collection or mass lesion/mass effect. Vascular: No hyperdense vessel or unexpected calcification. Skull: Normal. Negative for fracture or focal lesion. Sinuses/Orbits: Normal globes and orbits. Visualized sinuses and mastoid air cells are clear. Other: None. IMPRESSION: 1. No acute intracranial abnormalities.  Normal exam for age. Electronically  Signed   By: Lajean Manes M.D.   On: 06/17/2016 13:09    Procedures Procedures (including critical care time)  Medications Ordered in ED Medications  labetalol (NORMODYNE,TRANDATE) injection 10 mg (10 mg Intravenous Given 06/17/16 1158)  ondansetron (ZOFRAN) injection 4 mg (4 mg Intravenous Given 06/17/16 1157)  fentaNYL (SUBLIMAZE) injection 50 mcg (50 mcg Intravenous Given 06/17/16 1155)     Initial Impression / Assessment and Plan / ED Course  I have reviewed the triage vital signs and the nursing notes.  Pertinent labs & imaging results that were available during my care of the patient were reviewed by me and considered in my medical decision making (see chart for details).  Clinical Course    Dr. Vanita Panda has seen patient as well. Blood pressure controlled with IV 10 mg Labetolol. Headache improved with BP control and pain medications. CBC and BMP reassuring. EKG unremarkable. Chest xray and Head CT are not acute patient encouraged to call PCP tomorrow to discuss change in BP therapy for better control of blood pressure. No s/sx of hypertensive emergency.  Blood pressure 124/97, pulse 97, temperature 99.6 F (37.6 C), temperature source Oral, resp. rate 21, SpO2 94 %.   Final Clinical Impressions(s) / ED Diagnoses   Final diagnoses:  Hypertension, unspecified type  Intractable headache, unspecified chronicity pattern, unspecified headache type    New Prescriptions New Prescriptions   No medications on file     Earney Navy 06/17/16 1339    Carmin Muskrat, MD 06/19/16 2143

## 2016-06-18 ENCOUNTER — Telehealth (INDEPENDENT_AMBULATORY_CARE_PROVIDER_SITE_OTHER): Payer: Self-pay | Admitting: Orthopedic Surgery

## 2016-06-18 ENCOUNTER — Other Ambulatory Visit: Payer: Self-pay | Admitting: Internal Medicine

## 2016-06-18 ENCOUNTER — Telehealth: Payer: Self-pay | Admitting: *Deleted

## 2016-06-18 NOTE — Progress Notes (Signed)
Reviewed ED notes.  Appears she got more tylenol with codeine (may have gotten a pill in the ED?) and more sumatriptan.  I have now highlighted the do not give narcotics in red and enlarged the print so it shows up more obviously in the snapshot b/c evidently it's getting missed.  Pt will probably use the 10 pills in 24 hrs.  BP was sky high again.  Hopefully, she is taking the lopressor 150mg  po bid and the lisinopril 5mg .  I am still concerned that she is getting pain meds somewhere and when she shows up for appts and in the ED that she is in withdrawal causing her htn and tachy.  continue lopressor.  Increase lisinopril to 20mg  daily.  She needs an appt to have her bp checked here after making that adjustment x 1 week.

## 2016-06-18 NOTE — Telephone Encounter (Signed)
Pt called requesting a call back to discuss inj she got in her right shoulder, she stated it hurted only when lifting beforehand  but now it hurts all of the time. Wants to know what to do now.  [phone]

## 2016-06-18 NOTE — Telephone Encounter (Signed)
Patient called and stated that she was in the ER over the weekend and her Blood pressure is still running high. The ER doctor told her that the Lopressor Jayleon Mcfarlane not be working for her. Patient is wondering if it could be changed to something different. States Systolic is close to [id number]. Having headaches. Please Advise.

## 2016-06-19 MED ORDER — LISINOPRIL 20 MG PO TABS: 20.0000 mg | ORAL_TABLET | Freq: Every day | ORAL | 1 refills | Status: DC

## 2016-06-19 NOTE — Telephone Encounter (Signed)
Spoke with patient and advised results, rx sent to pharmacy by e-script appt scheduled

## 2016-06-19 NOTE — Telephone Encounter (Signed)
PER DR. [name]:  Reviewed ED notes.  Appears she got more tylenol with codeine (may have gotten a pill in the ED?) and more sumatriptan.  I have now highlighted the do not give narcotics in red and enlarged the print so it shows up more obviously in the snapshot b/c evidently it's getting missed.  Pt will probably use the 10 pills in 24 hrs.  BP was sky high again.  Hopefully, she is taking the lopressor 150mg  po bid and the lisinopril 5mg .  I am still concerned that she is getting pain meds somewhere and when she shows up for appts and in the ED that she is in withdrawal causing her htn and tachy.  continue lopressor.  Increase lisinopril to 20mg  daily.  She needs an appt to have her bp checked here after making that adjustment x 1 week.

## 2016-06-21 ENCOUNTER — Ambulatory Visit: Payer: Self-pay

## 2016-06-22 ENCOUNTER — Telehealth (INDEPENDENT_AMBULATORY_CARE_PROVIDER_SITE_OTHER): Payer: Self-pay | Admitting: Orthopedic Surgery

## 2016-06-22 ENCOUNTER — Ambulatory Visit (INDEPENDENT_AMBULATORY_CARE_PROVIDER_SITE_OTHER): Payer: Medicare Other | Admitting: Orthopedic Surgery

## 2016-06-22 NOTE — Telephone Encounter (Signed)
Patient called advised she will run out of her Rx for (tylenol #3) by the weekend. Paitent asked if the Rx  can be called into [facility] [address]. The number for [facility] is [phone]. The number to contact patient is [phone]

## 2016-06-25 ENCOUNTER — Ambulatory Visit (INDEPENDENT_AMBULATORY_CARE_PROVIDER_SITE_OTHER): Payer: Medicare Other | Admitting: Orthopedic Surgery

## 2016-06-25 ENCOUNTER — Other Ambulatory Visit (INDEPENDENT_AMBULATORY_CARE_PROVIDER_SITE_OTHER): Payer: Self-pay | Admitting: Orthopedic Surgery

## 2016-06-25 MED ORDER — ACETAMINOPHEN-CODEINE #3 300-30 MG PO TABS: 1.0000 | ORAL_TABLET | Freq: Three times a day (TID) | ORAL | 0 refills | Status: DC | PRN

## 2016-06-25 NOTE — Telephone Encounter (Signed)
rx written

## 2016-06-25 NOTE — Telephone Encounter (Signed)
I called and apologized due to inclement was unable to come in office on Friday when she originally called, advised that if she still needs rx refil let us know and we can call this into her pharmacy for her. This was not originally prescribed by MD, so I was unsure if she had called original prescribing doctor for refill, if not please call and let us know.

## 2016-06-26 ENCOUNTER — Telehealth: Payer: Self-pay | Admitting: *Deleted

## 2016-06-26 NOTE — Telephone Encounter (Signed)
Patient daughter in [name], [place] called and stated that Dr. Sharol Given prescribed pain medication with Codeine (Tylenol #3) worried about the addiction. Wants to know if you will call Dr. Sharol Given and discuss patient's history with him so he is aware.

## 2016-06-26 NOTE — Telephone Encounter (Signed)
We can kindly call his office and let them know that she has an opioid addiction and her epic chart indicates that she is not to be given any controlled substance prescriptions.

## 2016-06-26 NOTE — Telephone Encounter (Signed)
Called Dr. Jess Barters office [phone] and left message with Autumn to return call.

## 2016-06-27 ENCOUNTER — Ambulatory Visit (INDEPENDENT_AMBULATORY_CARE_PROVIDER_SITE_OTHER): Payer: Medicare Other | Admitting: Orthopedic Surgery

## 2016-06-27 DIAGNOSIS — M25511 Pain in right shoulder: Secondary | ICD-10-CM | POA: Insufficient documentation

## 2016-06-27 NOTE — Progress Notes (Deleted)
m °

## 2016-06-27 NOTE — Progress Notes (Signed)
Office Visit Note   Patient: [name]           Date of Birth: 04/25/1941           MRN: [id number] Visit Date: 06/27/2016              Requested by: [name], DO [place], [place] [address] PCP: Hollace Kinnier, DO  Chief Complaint  Patient presents with  . Right Shoulder - Pain    S/p injection on 06/11/16    HPI: Patient states that the injection tht she had on 06/11/16 was not helpful. The pain in the right shoulder has gotten worse and the ROM has decreased even more. [name] using tylenol #3 for the pain. Not able to left anything with this arm. Pt states that initially she felt that it was bursitis but because the injection did not work she was worried about it being her rotator cuff.   Pt also states that she has been having headaches for the past several months and then the pain travels down the left side of her neck and radiates to the hand. She complains of left hand numbness and tingling in her hand. This is intermittent and several days can go by without incident.   Patient states that she has had previous surgery of the C Spine with Dr. Ellene Route and she is to have an appt with him at the end of the month. Pamella Pert, RMA    Assessment & Plan: Visit Diagnoses:  1. Acute pain of right shoulder     Plan: MRI is requested to further evaluate the intra-articular pathology of the right shoulder. Patient will follow up Dr. Ellene Route for left upper extremity radicular symptoms. [name] scheduled for follow-up on February 5 if the MRI scan was not available by that time she will call to change her follow-up appointment.  Follow-Up Instructions: Return in about 2 weeks (around 07/11/2016).   Ortho Exam On examination patient is alert oriented no adenopathy well-dressed normal affect normal respiratory effort she ambulates with a walker. Patient has shoulder pain reproduced with shaking her hand. The thoracic outlet is nontender to palpation her cervical spine is  nontender to palpation. Patient has full range of motion left shoulder and abduction flexion to 70 of the right shoulder she has pain with Neer Hawkins impingement test pain with drop arm test on the right. Biceps tendon is tender to palpation. The before meals joint is nontender to palpation.  Imaging: No results found.  Orders:  Orders Placed This Encounter  Procedures  . MR SHOULDER RIGHT WO CONTRAST   No orders of the defined types were placed in this encounter.    Procedures: No procedures performed  Clinical Data: No additional findings.  Subjective: Review of Systems  Objective: Vital Signs: There were no vitals taken for this visit.  Specialty Comments:  No specialty comments available.  PMFS History: Patient Active Problem List   Diagnosis Date Noted  . Acute pain of right shoulder 06/27/2016  . Impingement syndrome of right shoulder 06/11/2016  . Colon cancer screening 05/17/2016  . Heme positive stool 05/17/2016  . Esophageal ulcer with bleeding 05/17/2016  . Duodenal ulcer 05/17/2016  . Acute blood loss anemia 01/18/2016  . Herniated nucleus pulposus, L2-3 right 12/19/2015  . Right foot pain 07/22/2015  . Chest pain 07/11/2015  . History of drug overdose 07/07/2015  . Frequent falls 07/07/2015  . Hyperlipidemia 07/07/2015  . Essential hypertension  07/07/2015  . Spinal stenosis, lumbar region, with neurogenic claudication 07/07/2015  . Severe episode of recurrent major depressive disorder, with psychotic features ([place]) 07/07/2015  . Visual hallucination 07/01/2015  . Delirium   . Severe episode of recurrent major depressive disorder, without psychotic features ([place])   . Back pain 06/20/2015  . MDD (major depressive disorder), recurrent episode, severe ([place]) 06/16/2015  . Lumbar stenosis 04/19/2015  . Hypokalemia 09/19/2014  . Constipation 09/19/2014  . Acute encephalopathy 09/18/2014  . Acute respiratory failure ([place]) 09/17/2014  . OSA (obstructive  sleep apnea) 09/17/2014  . Ankle fracture, lateral malleolus, closed 09/14/2014  . Major depressive disorder, recurrent severe without psychotic features ([place]) 08/07/2014  . Insomnia 08/05/2014  . Hip joint painful on movement 08/05/2014  . Degenerative disc disease, lumbar 08/05/2014  . Severe obesity (BMI >= 40) ([place]) 01/11/2014  . Depression   . Arthritis   . Polyneuropathy in other diseases classified elsewhere ([place]) 04/08/2013  . Restless legs syndrome (RLS) 04/08/2013  . UTI (lower urinary tract infection) 10/14/2011  . Dizziness 10/12/2011  . Fall 10/12/2011  . TIA (transient ischemic attack) 10/12/2011  . Cellulitis 10/12/2011  . GERD (gastroesophageal reflux disease) 10/12/2011   Past Medical History:  Diagnosis Date  . Anxiety   . Arthritis    djd  . Cataracts, bilateral    immature   . Cellulitis    10/11/11 hospitalized for cellulitis   . Chronic back pain    scoliosis/stenosis  . Depression    takes Zoloft daily  . Depression   . Dysrhythmia    hx tachy palpitations  . Elbow fracture, left April 2015  . GERD (gastroesophageal reflux disease)    hx pud '98  . Headache(784.0)    migraines yrs ago  . History of bronchitis    many yrs ago  . Hyperlipemia    takes Atorvastatin daily  . Hypertension    takes HCTZ daily  . Insomnia   . Joint pain   . Joint swelling   . Neuromuscular disorder ([place])    peripheral neuropathy, FEET AND LEGS  . Peripheral neuropathy ([place])   . Peripheral neuropathy ([facility])    takes Gabapentin daily  . 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 Radiculopathy, lumbar region   . Restless leg syndrome    takes Sinemet daily  . Stroke [facility])    ? 6 months ago - tests okay - [facility]   . Vertigo     Family History  Problem Relation Age of Onset  . Congestive Heart Failure Father   . Congestive Heart Failure Sister   . Alzheimer's disease Mother   . Diabetes Brother   . Cancer Brother   . Testicular cancer Brother   .  Arthritis Brother   . Cancer Brother   . Diabetes Brother   . Prostate cancer Brother     Past Surgical History:  Procedure Laterality Date  . ABDOMINAL HYSTERECTOMY  1975   bil oophorectomy  . BACK SURGERY   MANY YRS AGO   laminectomy x3  . CARPAL TUNNEL RELEASE  07/09/2012   Procedure: CARPAL TUNNEL RELEASE;  Surgeon: Magnus Sinning, MD;  Location: [facility];  Service: Orthopedics;  Laterality: Left;  . cervical disc surgery x2  2011  . CHOLECYSTECTOMY  1993  . colonosocpy    . ESOPHAGOGASTRODUODENOSCOPY    . FINGER ARTHROPLASTY  07/09/2012   Procedure: FINGER ARTHROPLASTY;  Surgeon: Magnus Sinning, MD;  Location:  [facility];  Service: Orthopedics;  Laterality: Left;  Interposition Arthroplasty CMC Joint Thumb Left   . HEMATOMA EVACUATION  2011   s/p rt cea  . JOINT REPLACEMENT  '01   total knee replacement, LEFT  . left knee arthroscopy  2001  . LIPOMA EXCISION  07/09/2012   Procedure: EXCISION LIPOMA;  Surgeon: Magnus Sinning, MD;  Location: [facility];  Service: Orthopedics;  Laterality: Left;  Excision Lipoma Dorsum Left Wrist   . LUMBAR LAMINECTOMY WITH COFLEX 1 LEVEL Bilateral 04/19/2015   Procedure: LUMBAR TWO-THREE LUMBAR LAMINECTOMY WITH COFLEX ;  Surgeon: Kristeen Miss, MD;  Location: [place] [facility];  Service: Neurosurgery;  Laterality: Bilateral;  Bilateral L23 laminectomy and foraminotomy with coflex  . LUMBAR LAMINECTOMY/DECOMPRESSION MICRODISCECTOMY Right 12/19/2015   Procedure: Right Lumbar two-three  Microdiskectomy;  Surgeon: Kristeen Miss, MD;  Location: [place] [facility];  Service: Neurosurgery;  Laterality: Right;  . ORIF ANKLE FRACTURE Right 09/14/2014   FIBULA   . ORIF ANKLE FRACTURE Right 09/14/2014   Procedure: OPEN REDUCTION INTERNAL FIXATION (ORIF) RIGHT ANKLE FRACTURE/SYNDESMOSIS ;  Surgeon: Wylene Simmer, MD;  Location: [place];  Service: Orthopedics;  Laterality: Right;  . right knee replacement     09/2011   . rt carotid enarterectomy  2011  . rt knee arthroscopy  '99  .  TOTAL KNEE ARTHROPLASTY  09/13/2011   Procedure: TOTAL KNEE ARTHROPLASTY;  Surgeon: Magnus Sinning, MD;  Location: [facility];  Service: Orthopedics;  Laterality: Right;   Social History   Occupational History  . Not on file.   Social History Main Topics  . Smoking status: Never Smoker  . Smokeless tobacco: Never Used  . Alcohol use No  . Drug use: No  . Sexual activity: No

## 2016-06-28 NOTE — Telephone Encounter (Signed)
Spoke with Colletta Maryland at Dr.Duda's office to inform them of Dr.[name]'s response. I also faxed this phone note along with snapshot that notates DO NOT PRESCRIBED ANY NARCOTICS.

## 2016-06-29 ENCOUNTER — Ambulatory Visit (INDEPENDENT_AMBULATORY_CARE_PROVIDER_SITE_OTHER): Payer: Medicare Other | Admitting: Internal Medicine

## 2016-06-29 ENCOUNTER — Encounter: Payer: Self-pay | Admitting: Internal Medicine

## 2016-06-29 VITALS — BP 148/80 | HR 66 | Temp 98.7°F | Ht 65.0 in | Wt 202.0 lb

## 2016-06-29 DIAGNOSIS — M48062 Spinal stenosis, lumbar region with neurogenic claudication: Secondary | ICD-10-CM | POA: Diagnosis not present

## 2016-06-29 DIAGNOSIS — G8929 Other chronic pain: Secondary | ICD-10-CM

## 2016-06-29 DIAGNOSIS — M25571 Pain in right ankle and joints of right foot: Secondary | ICD-10-CM | POA: Diagnosis not present

## 2016-06-29 DIAGNOSIS — F1120 Opioid dependence, uncomplicated: Secondary | ICD-10-CM

## 2016-06-29 DIAGNOSIS — I10 Essential (primary) hypertension: Secondary | ICD-10-CM

## 2016-06-29 DIAGNOSIS — K269 Duodenal ulcer, unspecified as acute or chronic, without hemorrhage or perforation: Secondary | ICD-10-CM

## 2016-06-29 MED ORDER — METOPROLOL SUCCINATE ER 100 MG PO TB24: 100.0000 mg | ORAL_TABLET | Freq: Every day | ORAL | 1 refills | Status: DC

## 2016-06-29 NOTE — Progress Notes (Signed)
Location:  [facility] clinic Provider: Rito Lecomte L. Mariea Clonts, D.O., C.M.D.  Code Status: full code Goals of Care:  Advanced Directives 06/17/2016  Does Patient Have a Medical Advance Directive? No  Type of Advance Directive -  Does patient want to make changes to medical advance directive? -  Copy of [place] in Chart? -  Would patient like information on creating a medical advance directive? -  Pre-existing out of facility DNR order (yellow form or pink MOST form) -  Some encounter information is confidential and restricted. Go to Review Flowsheets activity to see all data.   Chief Complaint  Patient presents with  . Acute Visit    discuss blood pressure    HPI: Patient is a 76 y.o. female seen today for an acute visit for blood pressure.    CMA checked bp with pt's machine and then manually in each arm.  Has not changed battery recently. Pt's machine here:  198/113 Right arm manual here:  158/90 Left arm manual here:  148/80 BP is still too high.   Still having a lot of headaches that start in the back of her skull--comes on at 4-5pm.  Can be working in the house or sitting watching tv and it will start.   Takes lopressor in am b/w 7-8am (takes 3 then)   Takes the zestril at lunch around noon to 1pm Takes second lopressor dose at dinnertime (takes 3 twice a day) Had been very high when went to the hospital.   Konrad Dolores and his wife continue to regulate her meds, even doing grocery shopping now Has mammogram next week and MRI of shoulder next week. No abdominal pain or bleeding Has cscope and endoscopy on Valentines day She reports walking at the mall.  She has read a book 7 steps to a pain free life per Dr. Clarice Pole advice.  Still uses TENS unit on foot when it acts up.  Past Medical History:  Diagnosis Date  . Anxiety   . Arthritis    djd  . Cataracts, bilateral    immature   . Cellulitis    10/11/11 hospitalized for cellulitis   . Chronic back pain    scoliosis/stenosis  . Depression    takes Zoloft daily  . Depression   . Dysrhythmia    hx tachy palpitations  . Elbow fracture, left April 2015  . GERD (gastroesophageal reflux disease)    hx pud '98  . Headache(784.0)    migraines yrs ago  . History of bronchitis    many yrs ago  . Hyperlipemia    takes Atorvastatin daily  . Hypertension    takes HCTZ daily  . Insomnia   . Joint pain   . Joint swelling   . Neuromuscular disorder ([place])    peripheral neuropathy, FEET AND LEGS  . Peripheral neuropathy ([place])   . Peripheral neuropathy (HCC)    takes Gabapentin daily  . 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 Radiculopathy, lumbar region   . Restless leg syndrome    takes Sinemet daily  . Stroke [facility])    ? 6 months ago - tests okay - [facility]   . Vertigo     Past Surgical History:  Procedure Laterality Date  . ABDOMINAL HYSTERECTOMY  1975   bil oophorectomy  . BACK SURGERY   MANY YRS AGO   laminectomy x3  . CARPAL TUNNEL RELEASE  07/09/2012   Procedure: CARPAL TUNNEL RELEASE;  Surgeon: Magnus Sinning, MD;  Location: [facility];  Service: Orthopedics;  Laterality: Left;  . cervical disc surgery x2  2011  . CHOLECYSTECTOMY  1993  . colonosocpy    . ESOPHAGOGASTRODUODENOSCOPY    . FINGER ARTHROPLASTY  07/09/2012   Procedure: FINGER ARTHROPLASTY;  Surgeon: Magnus Sinning, MD;  Location: [facility];  Service: Orthopedics;  Laterality: Left;  Interposition Arthroplasty [facility] Joint Thumb Left   . HEMATOMA EVACUATION  2011   s/p rt cea  . JOINT REPLACEMENT  '01   total knee replacement, LEFT  . left knee arthroscopy  2001  . LIPOMA EXCISION  07/09/2012   Procedure: EXCISION LIPOMA;  Surgeon: Magnus Sinning, MD;  Location: [facility];  Service: Orthopedics;  Laterality: Left;  Excision Lipoma Dorsum Left Wrist   . LUMBAR LAMINECTOMY WITH COFLEX 1 LEVEL Bilateral 04/19/2015   Procedure: LUMBAR TWO-THREE LUMBAR LAMINECTOMY WITH COFLEX ;  Surgeon: Kristeen Miss, MD;  Location: [place]  NEURO ORS;  Service: Neurosurgery;  Laterality: Bilateral;  Bilateral L23 laminectomy and foraminotomy with coflex  . LUMBAR LAMINECTOMY/DECOMPRESSION MICRODISCECTOMY Right 12/19/2015   Procedure: Right Lumbar two-three  Microdiskectomy;  Surgeon: Kristeen Miss, MD;  Location: [place] NEURO OR[facility];  Service: Neurosurgery;  Laterality: Right;  . ORIF ANKLE FRACTURE Right 09/14/2014   FIBULA   . ORIF ANKLE FRACTURE Right 09/14/2014   Procedure: OPEN REDUCTION INTERNAL FIXATION (ORIF) RIGHT ANKLE FRACTURE/SYNDESMOSIS ;  Surgeon: Wylene Simmer, MD;  Location: [place];  Service: Orthopedics;  Laterality: Right;  . right knee replacement     09/2011   . rt carotid enarterectomy  2011  . rt knee arthroscopy  '99  . TOTAL KNEE ARTHROPLASTY  09/13/2011   Procedure: TOTAL KNEE ARTHROPLASTY;  Surgeon: Magnus Sinning, MD;  Location: [facility];  Service: Orthopedics;  Laterality: Right;    Allergies  Allergen Reactions  . Contrast Media [Iodinated Diagnostic Agents] Anaphylaxis  . Acyclovir And Related Other (See Comments)    Reaction:  Unknown   . Sulfa Drugs Cross Reactors Nausea And Vomiting    Allergies as of 06/29/2016      Reactions   Contrast Media [iodinated Diagnostic Agents] Anaphylaxis   Acyclovir And Related Other (See Comments)   Reaction:  Unknown    Sulfa Drugs Cross Reactors Nausea And Vomiting      Medication List       Accurate as of 06/29/16 10:05 AM. Always use your most recent med list.          acetaminophen 500 MG tablet Commonly known as:  TYLENOL Take 500 mg by mouth every 8 (eight) hours as needed for moderate pain.   aspirin EC 81 MG tablet Take 81 mg by mouth daily.   atorvastatin 40 MG tablet Commonly known as:  LIPITOR TAKE 1 TABLET BY MOUTH ONCE DAILY FOR CHOLESTEROL   cholecalciferol 1000 units tablet Commonly known as:  VITAMIN D Take 1,000 Units by mouth daily.   gabapentin 400 MG capsule Commonly known as:  NEURONTIN Take 400 mg by mouth 4 (four) times  daily.   lisinopril 20 MG tablet Commonly known as:  PRINIVIL,ZESTRIL Take 1 tablet (20 mg total) by mouth daily.   metoprolol succinate 100 MG 24 hr tablet Commonly known as:  TOPROL-XL Take 1 tablet (100 mg total) by mouth daily. Take with or immediately following a meal.   pantoprazole 40 MG tablet Commonly known as:  PROTONIX Take 1 tablet (40 mg total) by mouth daily.   pyridOXINE 50 MG  tablet Commonly known as:  VITAMIN B-6 Take 50 mg by mouth daily.   SALONPAS EX Apply topically as needed.   sertraline 50 MG tablet Commonly known as:  ZOLOFT TAKE TWO TABLETS BY MOUTH  ONCE DAILY FOR DEPRESSION   sodium chloride 0.65 % Soln nasal spray Commonly known as:  OCEAN Place 1 spray into both nostrils as needed for congestion.   SUMAtriptan 50 MG tablet Commonly known as:  IMITREX Take 1 tablet (50 mg total) by mouth every 2 (two) hours as needed for migraine. May repeat in once if headache persists or recurs.       Review of Systems:  Review of Systems  Constitutional: Negative for chills, fever and malaise/fatigue.  HENT: Negative for congestion.   Eyes: Negative for blurred vision.       Glasses  Respiratory: Negative for cough and shortness of breath.   Cardiovascular: Negative for chest pain, palpitations and leg swelling.  Gastrointestinal: Negative for abdominal pain, blood in stool, constipation and melena.       Taking iron so stools dark; anemia resolved  Genitourinary: Negative for dysuria.  Musculoskeletal: Positive for back pain and joint pain. Negative for falls.  Skin: Negative for itching and rash.  Neurological: Positive for headaches. Negative for dizziness, loss of consciousness and weakness.  Endo/Heme/Allergies: Bruises/bleeds easily.  Psychiatric/Behavioral: Positive for depression, memory loss and substance abuse. Negative for suicidal ideas. The patient is not nervous/anxious.     Health Maintenance  Topic Date Due  . MAMMOGRAM  12/31/2009   . TETANUS/TDAP  06/03/2017 (Originally 01/26/1960)  . ZOSTAVAX  06/03/2018 (Originally 01/26/2002)  . COLONOSCOPY  01/30/2022  . INFLUENZA VACCINE  Completed  . DEXA SCAN  Completed  . PNA vac Low Risk Adult  Completed    Physical Exam: Vitals:   06/29/16 0924  BP: (!) 148/80  Pulse: 66  Temp: 98.7 F (37.1 C)  TempSrc: Oral  SpO2: 96%  Weight: 202 lb (91.6 kg)  Height: 5\' 5"  (1.651 m)   Body mass index is 33.61 kg/m. Physical Exam  Constitutional: [name] oriented to person, place, and time. She appears well-developed and well-nourished.  Eyes:  glasses  Cardiovascular: Normal rate, regular rhythm, normal heart sounds and intact distal pulses.   Pulmonary/Chest: Effort normal and breath sounds normal. No respiratory distress.  Abdominal: Bowel sounds are normal.  Musculoskeletal: Normal range of motion.  Walks with walker  Neurological: [name] alert and oriented to person, place, and time.  Skin: Skin is warm and dry.    Labs reviewed: Basic Metabolic Panel:  Recent Labs  02/27/16 1446 04/30/16 1640 06/17/16 1054  NA 135 136 137  K 4.9 4.7 3.9  CL 104 104 104  CO2 22 23 20*  GLUCOSE 103* 99 124*  BUN 20 16 18   CREATININE 0.79 0.80 0.58  CALCIUM 9.5 9.4 9.6   Liver Function Tests:  Recent Labs  07/02/15 0544 07/11/15 0511 07/27/15 2047 01/13/16  AST 21 24 31  53*  ALT 10* 10* 14 11  ALKPHOS 99 92 74 83  BILITOT 0.3 1.0 0.7  --   PROT 5.4* 6.8 7.1  --   ALBUMIN 2.6* 3.6 4.0  --     Recent Labs  07/11/15 0511 07/27/15 2047  LIPASE 23 22   No results for input(s): AMMONIA in the last 8760 hours. CBC:  Recent Labs  02/27/16 1446 04/30/16 1640 06/17/16 1054  WBC 6.5 8.9 14.2*  NEUTROABS 3,250 4,806 12.6*  HGB 11.7 12.4  16.2*  HCT 37.1 38.7 49.1*  MCV 96.9 99.7 103.8*  PLT 267 270 259   Lipid Panel:  Recent Labs  07/02/15 0544  CHOL 117  HDL 37*  LDLCALC 50  TRIG 152*  CHOLHDL 3.2   Lab Results  Component Value Date   HGBA1C  6.2 (H) 07/02/2015    Procedures since last visit: Dg Chest 2 View  Result Date: 06/17/2016 CLINICAL DATA:  Pt c/o n/v/d since ~3am. Severe headache. Recent injection right shoulder; possible right rotator cuff tear. meds htn. Nonsmoker. EXAM: CHEST  2 VIEW COMPARISON:  01/17/2016 FINDINGS: Cardiac silhouette is normal in size and configuration. Normal mediastinal and hilar contours. Clear lungs.  No pleural effusion.  No pneumothorax. Mild elevation the right hemidiaphragm, stable. Skeletal structures are demineralized but intact. IMPRESSION: No active cardiopulmonary disease. Electronically Signed   By: Lajean Manes M.D.   On: 06/17/2016 13:10   Ct Head Wo Contrast  Result Date: 06/17/2016 CLINICAL DATA:  Ct head wo, severe headaches, high bp, vomiting, no injury. EXAM: CT HEAD WITHOUT CONTRAST TECHNIQUE: Contiguous axial images were obtained from the base of the skull through the vertex without intravenous contrast. COMPARISON:  07/01/2015 FINDINGS: Brain: No evidence of acute infarction, hemorrhage, hydrocephalus, extra-axial collection or mass lesion/mass effect. Vascular: No hyperdense vessel or unexpected calcification. Skull: Normal. Negative for fracture or focal lesion. Sinuses/Orbits: Normal globes and orbits. Visualized sinuses and mastoid air cells are clear. Other: None. IMPRESSION: 1. No acute intracranial abnormalities.  Normal exam for age. Electronically Signed   By: Lajean Manes M.D.   On: 06/17/2016 13:09   Xr Shoulder Right  Result Date: 06/11/2016 Three-view radiographs of the right shoulder shows superior migration of the humeral head within the glenoid. The lung fields are clear. Patient has failed cervical spine hardware    Assessment/Plan 1. Uncontrolled hypertension -will change from lopressor to toprol xl 100mg  daily -cont zestril 20mg  daily (pt currently taking 4 of her 5mg ) -return in 2 wks  2. Uncomplicated opioid dependence ([place]) -ongoing, managed to get  herself some tylenol with codeine from orthopedics despite all of the warnings in epic chart -then claims she is managing her pain with other methods after she just got that the other day--not honest with me at all  3. Duodenal ulcer -no longer having abdominal pain or bleeding -off nsaids now  4. Chronic pain of right ankle -continues tens unit  5. Spinal stenosis, lumbar region, with neurogenic claudication -ongoing, cont walking program and current gabapentin--cannot take more due to renal function  Labs/tests ordered:  No orders of the defined types were placed in this encounter. needs UDS next visit  Next appt:  08/02/2016  Zaiyah Sottile L. Kimble Hitchens, D.O. [facility] [address], [place] [address] Cell Phone (Mon-Fri 8am-5pm):  [phone] On Call:  [phone] & follow prompts after 5pm & weekends Office Phone:  [phone] Office Fax:  [phone]

## 2016-07-03 ENCOUNTER — Ambulatory Visit: Payer: Self-pay

## 2016-07-03 ENCOUNTER — Other Ambulatory Visit (INDEPENDENT_AMBULATORY_CARE_PROVIDER_SITE_OTHER): Payer: Self-pay | Admitting: Orthopedic Surgery

## 2016-07-03 ENCOUNTER — Telehealth (INDEPENDENT_AMBULATORY_CARE_PROVIDER_SITE_OTHER): Payer: Self-pay | Admitting: Orthopedic Surgery

## 2016-07-03 NOTE — Telephone Encounter (Signed)
I called and advised pt that we can not refill this medication for her. [name] scheduled for an MRI tomorrow and will wait to see what the results show to see how to best treat her. Pt voiced understanding and will call with questions.

## 2016-07-03 NOTE — Telephone Encounter (Signed)
PATIENT CALLED NEEDING A REFILL ON CODEINE. SHE SAID SHE HAS BEEN TAKING THEM EVERY 6 HOURS INSTEAD OF 8, SO SHES RUNNING OUT QUICKLY. CB # [phone]

## 2016-07-04 ENCOUNTER — Other Ambulatory Visit: Payer: Self-pay

## 2016-07-06 ENCOUNTER — Telehealth: Payer: Self-pay | Admitting: *Deleted

## 2016-07-06 MED ORDER — LOSARTAN POTASSIUM 50 MG PO TABS: 50.0000 mg | ORAL_TABLET | Freq: Every day | ORAL | 5 refills | Status: DC

## 2016-07-06 NOTE — Telephone Encounter (Signed)
Discontinue lisinopril.  Begin losartan 50mg  po daily--her daughter in law fills her pillbox so she will need to be notified.  Continue bp checks and notify me if bp still elevated over 140/90.

## 2016-07-06 NOTE — Telephone Encounter (Signed)
Patient called and stated that her BP medication was switched but she is still having Headaches in the back of her head then travels to the front of head. Today it was 181/114 and she has changed the batteries in her machine. Please Advise.

## 2016-07-06 NOTE — Telephone Encounter (Signed)
Discussed response with patient, patient verbalized understanding. New rx sent to local pharmacy.   I discussed this with patient's daughter in-law as well.

## 2016-07-08 ENCOUNTER — Encounter (HOSPITAL_COMMUNITY): Payer: Self-pay | Admitting: Emergency Medicine

## 2016-07-08 ENCOUNTER — Emergency Department (HOSPITAL_COMMUNITY)
Admission: EM | Admit: 2016-07-08 | Discharge: 2016-07-08 | Disposition: A | Discharge status: Home / Self Care | Payer: Medicare Other | Attending: Emergency Medicine | Admitting: Emergency Medicine

## 2016-07-08 DIAGNOSIS — M25511 Pain in right shoulder: Secondary | ICD-10-CM | POA: Insufficient documentation

## 2016-07-08 DIAGNOSIS — Z79899 Other long term (current) drug therapy: Secondary | ICD-10-CM | POA: Insufficient documentation

## 2016-07-08 DIAGNOSIS — G8929 Other chronic pain: Secondary | ICD-10-CM | POA: Diagnosis not present

## 2016-07-08 DIAGNOSIS — Z96692 Finger-joint replacement of left hand: Secondary | ICD-10-CM | POA: Insufficient documentation

## 2016-07-08 DIAGNOSIS — Z7982 Long term (current) use of aspirin: Secondary | ICD-10-CM | POA: Insufficient documentation

## 2016-07-08 DIAGNOSIS — Z8673 Personal history of transient ischemic attack (TIA), and cerebral infarction without residual deficits: Secondary | ICD-10-CM | POA: Diagnosis not present

## 2016-07-08 DIAGNOSIS — Z96653 Presence of artificial knee joint, bilateral: Secondary | ICD-10-CM | POA: Diagnosis not present

## 2016-07-08 DIAGNOSIS — I10 Essential (primary) hypertension: Secondary | ICD-10-CM | POA: Insufficient documentation

## 2016-07-08 MED ORDER — KETOROLAC TROMETHAMINE 15 MG/ML IJ SOLN
15.0000 mg | Freq: Once | INTRAMUSCULAR | Status: AC
  Administered 2016-07-08: 15 mg via INTRAMUSCULAR
  Filled 2016-07-08: qty 1

## 2016-07-08 NOTE — ED Triage Notes (Addendum)
Patient c/o right shoulder pain since fall couple weeks ago. Patient spoke with Dr Sharol Given this morning on phone and was told that he could not call her in any tylenol with codeine over the phone. Patient supposed MRi on should on Tuesday for possible torn rotator cuff.  Patient reports one of her doctors is currently changing her BP medications around trying to get it down.

## 2016-07-08 NOTE — ED Provider Notes (Addendum)
[facility] DEPT Provider Note   CSN: [id number] Arrival date & time: 07/08/16  0918     History   Chief Complaint Chief Complaint  Patient presents with  . Shoulder Pain    HPI [name] is a 76 y.o. female.  HPI Patient presents to the emergency room with complaints of right shoulder pain. Patient states she fell several months ago. She initially thought she had a bursitis so she attempted to manage her symptoms at home. Patient eventually went to see Dr. Sharol Given.  She had an outpatient x-ray and she assumes  that did not show any evidence of fracture or dislocation according to the patient.  There is an x-ray in the medical records performed January 8 of this year that does not show any acute changes of the right shoulder. Patient states she was told Dr. Sharol Given thinks her symptoms may be related to a rotator cuff injury. [name] scheduled to have an MRI. Patient has been taking Tylenol with Codeine. She was called today to get a refill of that medication. She was told that could not be done and she could consider going into an urgent care to coming into the emergency room. Past Medical History:  Diagnosis Date  . Anxiety   . Arthritis    djd  . Cataracts, bilateral    immature   . Cellulitis    10/11/11 hospitalized for cellulitis   . Chronic back pain    scoliosis/stenosis  . Depression    takes Zoloft daily  . Depression   . Dysrhythmia    hx tachy palpitations  . Elbow fracture, left April 2015  . GERD (gastroesophageal reflux disease)    hx pud '98  . Headache(784.0)    migraines yrs ago  . History of bronchitis    many yrs ago  . Hyperlipemia    takes Atorvastatin daily  . Hypertension    takes HCTZ daily  . Insomnia   . Joint pain   . Joint swelling   . Neuromuscular disorder ([place])    peripheral neuropathy, FEET AND LEGS  . Peripheral neuropathy ([place])   . Peripheral neuropathy (HCC)    takes Gabapentin daily  . PPD positive, treated 1987   tx'd x 1 yr  w/ inh  . Radiculopathy, lumbar region   . Restless leg syndrome    takes Sinemet daily  . Stroke [facility])    ? 6 months ago - tests okay - [facility]   . Vertigo     Patient Active Problem List   Diagnosis Date Noted  . Acute pain of right shoulder 06/27/2016  . Impingement syndrome of right shoulder 06/11/2016  . Colon cancer screening 05/17/2016  . Heme positive stool 05/17/2016  . Esophageal ulcer with bleeding 05/17/2016  . Duodenal ulcer 05/17/2016  . Acute blood loss anemia 01/18/2016  . Herniated nucleus pulposus, L2-3 right 12/19/2015  . Right foot pain 07/22/2015  . Chest pain 07/11/2015  . History of drug overdose 07/07/2015  . Frequent falls 07/07/2015  . Hyperlipidemia 07/07/2015  . Essential hypertension 07/07/2015  . Spinal stenosis, lumbar region, with neurogenic claudication 07/07/2015  . Severe episode of recurrent major depressive disorder, with psychotic features ([place]) 07/07/2015  . Visual hallucination 07/01/2015  . Delirium   . Severe episode of recurrent major depressive disorder, without psychotic features ([place])   . Back pain 06/20/2015  . MDD (major depressive disorder), recurrent episode, severe ([place]) 06/16/2015  . Lumbar stenosis 04/19/2015  . Hypokalemia 09/19/2014  .  Constipation 09/19/2014  . Acute encephalopathy 09/18/2014  . Acute respiratory failure ([place]) 09/17/2014  . OSA (obstructive sleep apnea) 09/17/2014  . Ankle fracture, lateral malleolus, closed 09/14/2014  . Major depressive disorder, recurrent severe without psychotic features ([place]) 08/07/2014  . Insomnia 08/05/2014  . Hip joint painful on movement 08/05/2014  . Degenerative disc disease, lumbar 08/05/2014  . Severe obesity (BMI >= 40) ([place]) 01/11/2014  . Depression   . Arthritis   . Polyneuropathy in other diseases classified elsewhere ([place]) 04/08/2013  . Restless legs syndrome (RLS) 04/08/2013  . UTI (lower urinary tract infection) 10/14/2011  . Dizziness 10/12/2011  . Fall  10/12/2011  . TIA (transient ischemic attack) 10/12/2011  . Cellulitis 10/12/2011  . GERD (gastroesophageal reflux disease) 10/12/2011    Past Surgical History:  Procedure Laterality Date  . ABDOMINAL HYSTERECTOMY  1975   bil oophorectomy  . BACK SURGERY   MANY YRS AGO   laminectomy x3  . CARPAL TUNNEL RELEASE  07/09/2012   Procedure: CARPAL TUNNEL RELEASE;  Surgeon: Magnus Sinning, MD;  Location: [facility];  Service: Orthopedics;  Laterality: Left;  . cervical disc surgery x2  2011  . CHOLECYSTECTOMY  1993  . colonosocpy    . ESOPHAGOGASTRODUODENOSCOPY    . FINGER ARTHROPLASTY  07/09/2012   Procedure: FINGER ARTHROPLASTY;  Surgeon: Magnus Sinning, MD;  [facility];  Service: Orthopedics;  Laterality: Left;  Interposition Arthroplasty CMC Joint Thumb Left   . HEMATOMA EVACUATION  2011   s/p rt cea  . JOINT REPLACEMENT  '01   total knee replacement, LEFT  . left knee arthroscopy  2001  . LIPOMA EXCISION  07/09/2012   Procedure: EXCISION LIPOMA;  Surgeon: Magnus Sinning, MD;  [facility];  Service: Orthopedics;  Laterality: Left;  Excision Lipoma Dorsum Left Wrist   . LUMBAR LAMINECTOMY WITH COFLEX 1 LEVEL Bilateral 04/19/2015   Procedure: LUMBAR TWO-THREE LUMBAR LAMINECTOMY WITH COFLEX ;  Surgeon: Kristeen Miss, MD;  Location: [facility];  Service: Neurosurgery;  Laterality: Bilateral;  Bilateral L23 laminectomy and foraminotomy with coflex  . LUMBAR LAMINECTOMY/DECOMPRESSION MICRODISCECTOMY Right 12/19/2015   Procedure: Right Lumbar two-three  Microdiskectomy;  Surgeon: Kristeen Miss, MD;  Location: [facility]S;  Service: Neurosurgery;  Laterality: Right;  . ORIF ANKLE FRACTURE Right 09/14/2014   FIBULA   . ORIF ANKLE FRACTURE Right 09/14/2014   Procedure: OPEN REDUCTION INTERNAL FIXATION (ORIF) RIGHT ANKLE FRACTURE/SYNDESMOSIS ;  Surgeon: Wylene Simmer, MD;  Location: [facility];  Service: Orthopedics;  Laterality: Right;  . right knee replacement     09/2011   . rt carotid  enarterectomy  2011  . rt knee arthroscopy  '99  . TOTAL KNEE ARTHROPLASTY  09/13/2011   Procedure: TOTAL KNEE ARTHROPLASTY;  Surgeon: Magnus Sinning, MD;  [facility];  Service: Orthopedics;  Laterality: Right;    OB History    No data available       Home Medications    Prior to Admission medications   Medication Sig Start Date End Date Taking? Authorizing Provider  acetaminophen (TYLENOL) 500 MG tablet Take 500 mg by mouth every 8 (eight) hours as needed for moderate pain.    Historical Provider, MD  aspirin EC 81 MG tablet Take 81 mg by mouth daily.    Historical Provider, MD  atorvastatin (LIPITOR) 40 MG tablet TAKE 1 TABLET BY MOUTH ONCE DAILY FOR CHOLESTEROL 05/21/16   [name] L [name], DO  cholecalciferol (VITAMIN D) 1000 units tablet Take 1,000  Units by mouth daily.    Historical Provider, MD  gabapentin (NEURONTIN) 400 MG capsule Take 400 mg by mouth 4 (four) times daily.    Historical Provider, MD  [facility] EX) Apply topically as needed.    Historical Provider, MD  losartan (COZAAR) 50 MG tablet Take 1 tablet (50 mg total) by mouth daily. 07/06/16   [name] L [name], DO  metoprolol succinate (TOPROL-XL) 100 MG 24 hr tablet Take 1 tablet (100 mg total) by mouth daily. Take with or immediately following a meal. 06/29/16   [name] L [name], DO  pantoprazole (PROTONIX) 40 MG tablet Take 1 tablet (40 mg total) by mouth daily. 02/27/16   [name] L [name], DO  pyridOXINE (VITAMIN B-6) 50 MG tablet Take 50 mg by mouth daily.    Historical Provider, MD  sertraline (ZOLOFT) 50 MG tablet TAKE TWO TABLETS BY MOUTH  ONCE DAILY FOR DEPRESSION 02/27/16   [name] L [name], DO  sodium chloride (OCEAN) 0.65 % SOLN nasal spray Place 1 spray into both nostrils as needed for congestion. 06/28/15   Encarnacion Slates, NP  SUMAtriptan (IMITREX) 50 MG tablet Take 1 tablet (50 mg total) by mouth every 2 (two) hours as needed for migraine. May repeat in once if headache persists or recurs. 03/08/16    [name], DO    Family History Family History  Problem Relation Age of Onset  . Congestive Heart Failure Father   . Congestive Heart Failure Sister   . Alzheimer's disease Mother   . Diabetes Brother   . Cancer Brother   . Testicular cancer Brother   . Arthritis Brother   . Cancer Brother   . Diabetes Brother   . Prostate cancer Brother     Social History Social History  Substance Use Topics  . Smoking status: Never Smoker  . Smokeless tobacco: Never Used  . Alcohol use No     Allergies   Contrast media [iodinated diagnostic agents]; Acyclovir and related; and Sulfa drugs cross reactors   Review of Systems Review of Systems  All other systems reviewed and are negative.    Physical Exam Updated Vital Signs BP (!) 182/102 (BP Location: Left Arm)   Pulse 94   Temp 99.1 F (37.3 C) (Oral)   Resp 19   SpO2 95%   Physical Exam  Constitutional: She appears well-developed and well-nourished. No distress.  HENT:  Head: Normocephalic and atraumatic.  Right Ear: External ear normal.  Left Ear: External ear normal.  Eyes: Conjunctivae are normal. Right eye exhibits no discharge. Left eye exhibits no discharge. No scleral icterus.  Neck: Neck supple. No tracheal deviation present.  Cardiovascular: Normal rate.   Pulmonary/Chest: Effort normal. No stridor. No respiratory distress.  Abdominal: She exhibits no distension.  Musculoskeletal: She exhibits no edema.       Right shoulder: She exhibits decreased range of motion and tenderness. She exhibits no swelling, no effusion and no deformity.  Neurological: [name] alert. Cranial nerve deficit: no gross deficits.  Skin: Skin is warm and dry. No rash noted.  Psychiatric: She has a normal mood and affect.  Nursing note and vitals reviewed.    ED Treatments / Results   Radiology No results found.  Procedures Procedures (including critical care time)  Medications Ordered in ED Medications  ketorolac  (TORADOL) 15 MG/ML injection 15 mg (15 mg Intramuscular Given 07/08/16 1149)     Initial Impression / Assessment and Plan / ED Course  I have reviewed  the triage vital signs and the nursing notes.  Pertinent labs & imaging results that were available during my care of the patient were reviewed by me and considered in my medical decision making (see chart for details).   Pt presents with complaints of chronic pain issues with her shoulder requesting T#3.  Pt has an ED care plan because of recurrent opiate prescriptions.  I did offer an IM injection.  Pt drove however and cannot take any opiate pain medications.  She was given 30 tabs of t3 on 1/14 and then on 1/23.  I explained that refilling her opiate pain medications would not be appropriate in the ED.  We can certainly give her a dose of pain meds while she is here.   HTN noted.  BP is chronically elevated as noted at previous visits.  Doubt acute issue associated with that at this time. Final Clinical Impressions(s) / ED Diagnoses   Final diagnoses:  Chronic right shoulder pain  Hypertension, unspecified type    New Prescriptions New Prescriptions   No medications on file     Dorie Rank, MD 07/08/16 1154    Dorie Rank, MD 07/08/16 1155

## 2016-07-09 ENCOUNTER — Ambulatory Visit (INDEPENDENT_AMBULATORY_CARE_PROVIDER_SITE_OTHER): Payer: Medicare Other | Admitting: Orthopedic Surgery

## 2016-07-09 ENCOUNTER — Telehealth (HOSPITAL_COMMUNITY): Payer: Self-pay | Admitting: Emergency Medicine

## 2016-07-09 ENCOUNTER — Encounter (INDEPENDENT_AMBULATORY_CARE_PROVIDER_SITE_OTHER): Payer: Self-pay | Admitting: Orthopedic Surgery

## 2016-07-09 VITALS — Ht 65.0 in | Wt 202.0 lb

## 2016-07-09 DIAGNOSIS — M7541 Impingement syndrome of right shoulder: Secondary | ICD-10-CM

## 2016-07-09 MED ORDER — LIDOCAINE HCL 1 % IJ SOLN
5.0000 mL | INTRAMUSCULAR | Status: AC | PRN
  Administered 2016-07-09: 5 mL

## 2016-07-09 MED ORDER — METHYLPREDNISOLONE ACETATE 40 MG/ML IJ SUSP
40.0000 mg | INTRAMUSCULAR | Status: AC | PRN
  Administered 2016-07-09: 40 mg via INTRA_ARTICULAR

## 2016-07-09 NOTE — Telephone Encounter (Signed)
Entered in error

## 2016-07-09 NOTE — Progress Notes (Signed)
Office Visit Note   Patient: [name]           Date of Birth: 09-25-1940           MRN: [id number] Visit Date: 07/09/2016              Requested by: [name], DO [place], [place] [address] PCP: Hollace Kinnier, DO  Chief Complaint  Patient presents with  . Right Shoulder - Pain    HPI: Follow up from ER visit this weekend for acute right shoulder pain. The pt has requested pain medication this weekend. She wanted tylenol #3 called into her pharm this weekend. This was not given. Last injection was 06/11/16. Here for injection today. Autumn L Forrest, RMA  Patient called multiple times over the weekend complaining of right shoulder pain she did go to the emergency room received a injection of Toradol. Patient repeatedly called for narcotics.  Assessment & Plan: Visit Diagnoses:  1. Impingement syndrome of right shoulder     Plan: Follow-up after MRI scan.  Follow-Up Instructions: Return in about 1 week (around 07/16/2016).   Ortho Exam Examination patient is alert oriented no adenopathy well-dressed normal affect normal respiratory effort she uses a rolling walker for ambulation. She has pain over the deltoid muscle region of the right shoulder there is no redness no cellulitis no signs of infection. She has pain with Neer and Hawkins impingement test pain with drop arm test or no radicular symptoms.  Imaging: No results found.  Orders:  No orders of the defined types were placed in this encounter.  No orders of the defined types were placed in this encounter.    Procedures: Large Joint Inj Date/Time: 07/09/2016 8:29 AM Performed by: DUDA, MARCUS V Authorized by: Newt Minion   Consent Given by:  Patient Site marked: the procedure site was marked   Timeout: prior to procedure the correct patient, procedure, and site was verified   Indications:  Pain and diagnostic evaluation Location:  Shoulder Site:  R subacromial bursa Prep: patient was  prepped and draped in usual sterile fashion   Needle Size:  22 G Needle Length:  1.5 inches Approach:  Posterior Ultrasound Guidance: No   Fluoroscopic Guidance: No   Arthrogram: No   Medications:  5 mL lidocaine 1 %; 40 mg methylPREDNISolone acetate 40 MG/ML Aspiration Attempted: No   Patient tolerance:  Patient tolerated the procedure well with no immediate complications    Clinical Data: No additional findings.  Subjective: Review of Systems  Objective: Vital Signs: Ht 5\' 5"  (1.651 m)   Wt 202 lb (91.6 kg)   BMI 33.61 kg/m   Specialty Comments:  No specialty comments available.  PMFS History: Patient Active Problem List   Diagnosis Date Noted  . Acute pain of right shoulder 06/27/2016  . Impingement syndrome of right shoulder 06/11/2016  . Colon cancer screening 05/17/2016  . Heme positive stool 05/17/2016  . Esophageal ulcer with bleeding 05/17/2016  . Duodenal ulcer 05/17/2016  . Acute blood loss anemia 01/18/2016  . Herniated nucleus pulposus, L2-3 right 12/19/2015  . Right foot pain 07/22/2015  . Chest pain 07/11/2015  . History of drug overdose 07/07/2015  . Frequent falls 07/07/2015  . Hyperlipidemia 07/07/2015  . Essential hypertension 07/07/2015  . Spinal stenosis, lumbar region, with neurogenic claudication 07/07/2015  . Severe episode of recurrent major depressive disorder, with psychotic features ([place]) 07/07/2015  . Visual hallucination 07/01/2015  . Delirium   .  Severe episode of recurrent major depressive disorder, without psychotic features ([place])   . Back pain 06/20/2015  . MDD (major depressive disorder), recurrent episode, severe ([place]) 06/16/2015  . Lumbar stenosis 04/19/2015  . Hypokalemia 09/19/2014  . Constipation 09/19/2014  . Acute encephalopathy 09/18/2014  . Acute respiratory failure ([place]) 09/17/2014  . OSA (obstructive sleep apnea) 09/17/2014  . Ankle fracture, lateral malleolus, closed 09/14/2014  . Major depressive disorder,  recurrent severe without psychotic features ([place]) 08/07/2014  . Insomnia 08/05/2014  . Hip joint painful on movement 08/05/2014  . Degenerative disc disease, lumbar 08/05/2014  . Severe obesity (BMI >= 40) ([place]) 01/11/2014  . Depression   . Arthritis   . Polyneuropathy in other diseases classified elsewhere ([place]) 04/08/2013  . Restless legs syndrome (RLS) 04/08/2013  . UTI (lower urinary tract infection) 10/14/2011  . Dizziness 10/12/2011  . Fall 10/12/2011  . TIA (transient ischemic attack) 10/12/2011  . Cellulitis 10/12/2011  . GERD (gastroesophageal reflux disease) 10/12/2011   Past Medical History:  Diagnosis Date  . Anxiety   . Arthritis    djd  . Cataracts, bilateral    immature   . Cellulitis    10/11/11 hospitalized for cellulitis   . Chronic back pain    scoliosis/stenosis  . Depression    takes Zoloft daily  . Depression   . Dysrhythmia    hx tachy palpitations  . Elbow fracture, left April 2015  . GERD (gastroesophageal reflux disease)    hx pud '98  . Headache(784.0)    migraines yrs ago  . History of bronchitis    many yrs ago  . Hyperlipemia    takes Atorvastatin daily  . Hypertension    takes HCTZ daily  . Insomnia   . Joint pain   . Joint swelling   . Neuromuscular disorder ([place])    peripheral neuropathy, FEET AND LEGS  . Peripheral neuropathy ([place])   . Peripheral neuropathy (HCC)    takes Gabapentin daily  . 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 Radiculopathy, lumbar region   . Restless leg syndrome    takes Sinemet daily  . Stroke [facility])    ? 6 months ago - tests okay - [facility]   . Vertigo     Family History  Problem Relation Age of Onset  . Congestive Heart Failure Father   . Congestive Heart Failure Sister   . Alzheimer's disease Mother   . Diabetes Brother   . Cancer Brother   . Testicular cancer Brother   . Arthritis Brother   . Cancer Brother   . Diabetes Brother   . Prostate cancer Brother     Past Surgical  History:  Procedure Laterality Date  . ABDOMINAL HYSTERECTOMY  1975   bil oophorectomy  . BACK SURGERY   MANY YRS AGO   laminectomy x3  . CARPAL TUNNEL RELEASE  07/09/2012   Procedure: CARPAL TUNNEL RELEASE;  Surgeon: Magnus Sinning, MD;  [facility];  Service: Orthopedics;  Laterality: Left;  . cervical disc surgery x2  2011  . CHOLECYSTECTOMY  1993  . colonosocpy    . ESOPHAGOGASTRODUODENOSCOPY    . FINGER ARTHROPLASTY  07/09/2012   Procedure: FINGER ARTHROPLASTY;  Surgeon: Magnus Sinning, MD;  [facility];  Service: Orthopedics;  Laterality: Left;  Interposition Arthroplasty CMC Joint Thumb Left   . HEMATOMA EVACUATION  2011   s/p rt cea  . JOINT REPLACEMENT  '01   total  knee replacement, LEFT  . left knee arthroscopy  2001  . LIPOMA EXCISION  07/09/2012   Procedure: EXCISION LIPOMA;  Surgeon: Magnus Sinning, MD;  [facility];  Service: Orthopedics;  Laterality: Left;  Excision Lipoma Dorsum Left Wrist   . LUMBAR LAMINECTOMY WITH COFLEX 1 LEVEL Bilateral 04/19/2015   Procedure: LUMBAR TWO-THREE LUMBAR LAMINECTOMY WITH COFLEX ;  Surgeon: Kristeen Miss, MD;  Location: [facility];  Service: Neurosurgery;  Laterality: Bilateral;  Bilateral L23 laminectomy and foraminotomy with coflex  . LUMBAR LAMINECTOMY/DECOMPRESSION MICRODISCECTOMY Right 12/19/2015   Procedure: Right Lumbar two-three  Microdiskectomy;  Surgeon: Kristeen Miss, MD;  Location: [place] [facility];  Service: Neurosurgery;  Laterality: Right;  . ORIF ANKLE FRACTURE Right 09/14/2014   FIBULA   . ORIF ANKLE FRACTURE Right 09/14/2014   Procedure: OPEN REDUCTION INTERNAL FIXATION (ORIF) RIGHT ANKLE FRACTURE/SYNDESMOSIS ;  Surgeon: Wylene Simmer, MD;  Location: [facility];  Service: Orthopedics;  Laterality: Right;  . right knee replacement     09/2011   . rt carotid enarterectomy  2011  . rt knee arthroscopy  '99  . TOTAL KNEE ARTHROPLASTY  09/13/2011   Procedure: TOTAL KNEE ARTHROPLASTY;  Surgeon: Magnus Sinning, MD;   [facility];  Service: Orthopedics;  Laterality: Right;   Social History   Occupational History  . Not on file.   Social History Main Topics  . Smoking status: Never Smoker  . Smokeless tobacco: Never Used  . Alcohol use No  . Drug use: No  . Sexual activity: No

## 2016-07-10 ENCOUNTER — Ambulatory Visit
Admission: RE | Admit: 2016-07-10 | Discharge: 2016-07-10 | Disposition: A | Discharge status: Home / Self Care | Payer: Medicare Other | Source: Ambulatory Visit | Attending: Orthopedic Surgery | Admitting: Orthopedic Surgery

## 2016-07-10 DIAGNOSIS — M25511 Pain in right shoulder: Secondary | ICD-10-CM | POA: Diagnosis not present

## 2016-07-12 ENCOUNTER — Telehealth (INDEPENDENT_AMBULATORY_CARE_PROVIDER_SITE_OTHER): Payer: Self-pay | Admitting: *Deleted

## 2016-07-12 NOTE — Telephone Encounter (Signed)
OK to send  prescription for 30 Flexeril. One by mouth 3 times a day as needed for muscle spasm

## 2016-07-12 NOTE — Telephone Encounter (Signed)
Will hold off because patient has appointment tomorrow, previous history of opoid addiction.

## 2016-07-12 NOTE — Telephone Encounter (Signed)
Pt called requesting a muscle relaxer. Pt has taken flexeril before

## 2016-07-13 ENCOUNTER — Ambulatory Visit (INDEPENDENT_AMBULATORY_CARE_PROVIDER_SITE_OTHER): Payer: Medicare Other | Admitting: Family

## 2016-07-13 ENCOUNTER — Ambulatory Visit: Payer: Medicare Other | Admitting: Internal Medicine

## 2016-07-16 ENCOUNTER — Ambulatory Visit (INDEPENDENT_AMBULATORY_CARE_PROVIDER_SITE_OTHER): Payer: Medicare Other | Admitting: Orthopedic Surgery

## 2016-07-16 ENCOUNTER — Telehealth: Payer: Self-pay | Admitting: Gastroenterology

## 2016-07-16 NOTE — Telephone Encounter (Signed)
Patient states that a few weeks ago went to ED for elevated BP, "250/135". She has been seeing her PCP since then to regulate her medications for hypertension. Patient states that today it was 190/100. She has a follow up visit with her doctor this Friday, I asked that we get a clearance from PCP to proceed with Endo/colon. I will reschedule her procedure. I explained that she has rescheduled twice now, since she has heme + stool needs to have these procedures very soon. She verbalizes her understanding. Dr. Havery Moros please review chart, thank you.

## 2016-07-17 ENCOUNTER — Encounter (INDEPENDENT_AMBULATORY_CARE_PROVIDER_SITE_OTHER): Payer: Self-pay | Admitting: Orthopedic Surgery

## 2016-07-17 ENCOUNTER — Ambulatory Visit (INDEPENDENT_AMBULATORY_CARE_PROVIDER_SITE_OTHER): Payer: Medicare Other | Admitting: Orthopedic Surgery

## 2016-07-17 DIAGNOSIS — M75111 Incomplete rotator cuff tear or rupture of right shoulder, not specified as traumatic: Secondary | ICD-10-CM | POA: Insufficient documentation

## 2016-07-17 NOTE — Telephone Encounter (Signed)
I don't see her on the schedule for tomorrow so am assuming she had cancelled it? If that's the case please reschedule her for a few weeks and she needs to follow up with her PCP to ensure better control of HTN prior to the procedure. If she is on the schedule for tomorrow please let me know and we can discuss. Thanks

## 2016-07-17 NOTE — Progress Notes (Signed)
Office Visit Note   Patient: [name]           Date of Birth: 08-10-1940           MRN: [id number] Visit Date: 07/17/2016              Requested by: [name], DO [place], [place] [address] PCP: Hollace Kinnier, DO  Chief Complaint  Patient presents with  . Right Shoulder - Pain    HPI: MRI right shoulder review.     Assessment & Plan: Visit Diagnoses:  1. Nontraumatic incomplete tear of right rotator cuff     Plan: Patient has pain at all times she cannot sleep cannot do her activities of daily living she states she only had temporary relief with steroid injections and would like to proceed with arthroscopic debridement and decompression. Risk and benefits were discussed including infection neurovascular injury persistent pain discussed that she would most likely be about 75% better. Patient states she understands wish to proceed at this time.  Follow-Up Instructions: Return in about 2 weeks (around 07/31/2016).   Ortho Exam Examination patient is alert oriented no adenopathy well-dressed normal affect normal respiratory effort she has abduction and flexion only to 90. She has pain with Neer and Hawkins impingement test pain with drop arm test pain with empty can test. She has tenderness to palpation of the biceps tendon no tenderness to palpation of the before meals joint. Review of the MRI scan shows partial tearing of the rotator cuff a type III acromion with AC arthropathy with subacromial bursitis.  Imaging: No results found.  Orders:  No orders of the defined types were placed in this encounter.  No orders of the defined types were placed in this encounter.    Procedures: No procedures performed  Clinical Data: No additional findings.  Subjective: Review of Systems  Objective: Vital Signs: Ht 5\' 5"  (1.651 m)   Wt 202 lb (91.6 kg)   BMI 33.61 kg/m   Specialty Comments:  No specialty comments available.  PMFS History: Patient  Active Problem List   Diagnosis Date Noted  . Nontraumatic incomplete tear of right rotator cuff 07/17/2016  . Acute pain of right shoulder 06/27/2016  . Impingement syndrome of right shoulder 06/11/2016  . Colon cancer screening 05/17/2016  . Heme positive stool 05/17/2016  . Esophageal ulcer with bleeding 05/17/2016  . Duodenal ulcer 05/17/2016  . Acute blood loss anemia 01/18/2016  . Herniated nucleus pulposus, L2-3 right 12/19/2015  . Right foot pain 07/22/2015  . Chest pain 07/11/2015  . History of drug overdose 07/07/2015  . Frequent falls 07/07/2015  . Hyperlipidemia 07/07/2015  . Essential hypertension 07/07/2015  . Spinal stenosis, lumbar region, with neurogenic claudication 07/07/2015  . Severe episode of recurrent major depressive disorder, with psychotic features ([place]) 07/07/2015  . Visual hallucination 07/01/2015  . Delirium   . Severe episode of recurrent major depressive disorder, without psychotic features ([place])   . Back pain 06/20/2015  . MDD (major depressive disorder), recurrent episode, severe ([place]) 06/16/2015  . Lumbar stenosis 04/19/2015  . Hypokalemia 09/19/2014  . Constipation 09/19/2014  . Acute encephalopathy 09/18/2014  . Acute respiratory failure ([place]) 09/17/2014  . OSA (obstructive sleep apnea) 09/17/2014  . Ankle fracture, lateral malleolus, closed 09/14/2014  . Major depressive disorder, recurrent severe without psychotic features ([place]) 08/07/2014  . Insomnia 08/05/2014  . Hip joint painful on movement 08/05/2014  . Degenerative disc disease, lumbar 08/05/2014  . Severe  obesity (BMI >= 40) ([place]) 01/11/2014  . Depression   . Arthritis   . Polyneuropathy in other diseases classified elsewhere ([place]) 04/08/2013  . Restless legs syndrome (RLS) 04/08/2013  . UTI (lower urinary tract infection) 10/14/2011  . Dizziness 10/12/2011  . Fall 10/12/2011  . TIA (transient ischemic attack) 10/12/2011  . Cellulitis 10/12/2011  . GERD (gastroesophageal  reflux disease) 10/12/2011   Past Medical History:  Diagnosis Date  . Anxiety   . Arthritis    djd  . Cataracts, bilateral    immature   . Cellulitis    10/11/11 hospitalized for cellulitis   . Chronic back pain    scoliosis/stenosis  . Depression    takes Zoloft daily  . Depression   . Dysrhythmia    hx tachy palpitations  . Elbow fracture, left April 2015  . GERD (gastroesophageal reflux disease)    hx pud '98  . Headache(784.0)    migraines yrs ago  . History of bronchitis    many yrs ago  . Hyperlipemia    takes Atorvastatin daily  . Hypertension    takes HCTZ daily  . Insomnia   . Joint pain   . Joint swelling   . Neuromuscular disorder ([place])    peripheral neuropathy, FEET AND LEGS  . Peripheral neuropathy ([place])   . Peripheral neuropathy (HCC)    takes Gabapentin daily  . 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 Radiculopathy, lumbar region   . Restless leg syndrome    takes Sinemet daily  . Stroke [facility])    ? 6 months ago - tests okay - [facility]   . Vertigo     Family History  Problem Relation Age of Onset  . Congestive Heart Failure Father   . Congestive Heart Failure Sister   . Alzheimer's disease Mother   . Diabetes Brother   . Cancer Brother   . Testicular cancer Brother   . Arthritis Brother   . Cancer Brother   . Diabetes Brother   . Prostate cancer Brother     Past Surgical History:  Procedure Laterality Date  . ABDOMINAL HYSTERECTOMY  1975   bil oophorectomy  . BACK SURGERY   MANY YRS AGO   laminectomy x3  . CARPAL TUNNEL RELEASE  07/09/2012   Procedure: CARPAL TUNNEL RELEASE;  Surgeon: Magnus Sinning, MD;  [facility];  Service: Orthopedics;  Laterality: Left;  . cervical disc surgery x2  2011  . CHOLECYSTECTOMY  1993  . colonosocpy    . ESOPHAGOGASTRODUODENOSCOPY    . FINGER ARTHROPLASTY  07/09/2012   Procedure: FINGER ARTHROPLASTY;  Surgeon: Magnus Sinning, MD;  [facility];  Service: Orthopedics;  Laterality:  Left;  Interposition Arthroplasty CMC Joint Thumb Left   . HEMATOMA EVACUATION  2011   s/p rt cea  . JOINT REPLACEMENT  '01   total knee replacement, LEFT  . left knee arthroscopy  2001  . LIPOMA EXCISION  07/09/2012   Procedure: EXCISION LIPOMA;  Surgeon: Magnus Sinning, MD;  [facility];  Service: Orthopedics;  Laterality: Left;  Excision Lipoma Dorsum Left Wrist   . LUMBAR LAMINECTOMY WITH COFLEX 1 LEVEL Bilateral 04/19/2015   Procedure: LUMBAR TWO-THREE LUMBAR LAMINECTOMY WITH COFLEX ;  Surgeon: Kristeen Miss, MD;  Location: [place] [facility];  Service: Neurosurgery;  Laterality: Bilateral;  Bilateral L23 laminectomy and foraminotomy with coflex  . LUMBAR LAMINECTOMY/DECOMPRESSION MICRODISCECTOMY Right 12/19/2015   Procedure: Right Lumbar two-three  Microdiskectomy;  Surgeon: Kristeen Miss, MD;  Location: [place] [facility];  Service: Neurosurgery;  Laterality: Right;  . ORIF ANKLE FRACTURE Right 09/14/2014   FIBULA   . ORIF ANKLE FRACTURE Right 09/14/2014   Procedure: OPEN REDUCTION INTERNAL FIXATION (ORIF) RIGHT ANKLE FRACTURE/SYNDESMOSIS ;  Surgeon: Wylene Simmer, MD;  Location: [place];  Service: Orthopedics;  Laterality: Right;  . right knee replacement     09/2011   . rt carotid enarterectomy  2011  . rt knee arthroscopy  '99  . TOTAL KNEE ARTHROPLASTY  09/13/2011   Procedure: TOTAL KNEE ARTHROPLASTY;  Surgeon: Magnus Sinning, MD;  [facility];  Service: Orthopedics;  Laterality: Right;   Social History   Occupational History  . Not on file.   Social History Main Topics  . Smoking status: Never Smoker  . Smokeless tobacco: Never Used  . Alcohol use No  . Drug use: No  . Sexual activity: No

## 2016-07-18 ENCOUNTER — Encounter: Payer: Medicare Other | Admitting: Gastroenterology

## 2016-07-19 ENCOUNTER — Telehealth (INDEPENDENT_AMBULATORY_CARE_PROVIDER_SITE_OTHER): Payer: Self-pay | Admitting: Orthopedic Surgery

## 2016-07-19 ENCOUNTER — Telehealth: Payer: Self-pay | Admitting: Internal Medicine

## 2016-07-19 NOTE — Telephone Encounter (Signed)
Patient called asking why she was rejected a pain medication from the orthopedic doctor. Patient seemed upset and would like to be called back by the end of the day. Patient would like a reason why she was rejected.  Please advise.

## 2016-07-19 NOTE — Telephone Encounter (Signed)
Patient called asked if Dr Sharol Given could prescribe her something for pain. Patient said she is not sleeping at night. Patient asked if the Rx could be called into [facility] on [address]. (tylenol 3 or Ultram). The number to contact patient is [phone]

## 2016-07-19 NOTE — Telephone Encounter (Signed)
Patient aware of response, patient will further discuss at appointment tomorrow

## 2016-07-19 NOTE — Telephone Encounter (Signed)
Per Dr.[name]  DO NOT PRESCRIBE ANY NARCOTICS

## 2016-07-19 NOTE — Telephone Encounter (Signed)
Called and spoke with patient to advise Dr. Sharol Given is not able to prescribe pain medication. We have been contacted by Dr. Cyndi Lennert office before not to prescribe narcotics.

## 2016-07-20 ENCOUNTER — Ambulatory Visit: Payer: Self-pay

## 2016-07-20 ENCOUNTER — Telehealth: Payer: Self-pay

## 2016-07-20 ENCOUNTER — Encounter: Payer: Self-pay | Admitting: Internal Medicine

## 2016-07-20 ENCOUNTER — Ambulatory Visit (INDEPENDENT_AMBULATORY_CARE_PROVIDER_SITE_OTHER): Payer: Medicare Other | Admitting: Internal Medicine

## 2016-07-20 VITALS — BP 140/80 | HR 73 | Temp 98.7°F | Wt 202.0 lb

## 2016-07-20 DIAGNOSIS — M75101 Unspecified rotator cuff tear or rupture of right shoulder, not specified as traumatic: Secondary | ICD-10-CM

## 2016-07-20 DIAGNOSIS — G8929 Other chronic pain: Secondary | ICD-10-CM | POA: Diagnosis not present

## 2016-07-20 DIAGNOSIS — K269 Duodenal ulcer, unspecified as acute or chronic, without hemorrhage or perforation: Secondary | ICD-10-CM | POA: Diagnosis not present

## 2016-07-20 DIAGNOSIS — M48062 Spinal stenosis, lumbar region with neurogenic claudication: Secondary | ICD-10-CM

## 2016-07-20 DIAGNOSIS — M25571 Pain in right ankle and joints of right foot: Secondary | ICD-10-CM | POA: Diagnosis not present

## 2016-07-20 DIAGNOSIS — R350 Frequency of micturition: Secondary | ICD-10-CM | POA: Diagnosis not present

## 2016-07-20 DIAGNOSIS — D500 Iron deficiency anemia secondary to blood loss (chronic): Secondary | ICD-10-CM

## 2016-07-20 DIAGNOSIS — M12811 Other specific arthropathies, not elsewhere classified, right shoulder: Secondary | ICD-10-CM | POA: Diagnosis not present

## 2016-07-20 DIAGNOSIS — G451 Carotid artery syndrome (hemispheric): Secondary | ICD-10-CM

## 2016-07-20 DIAGNOSIS — F1120 Opioid dependence, uncomplicated: Secondary | ICD-10-CM | POA: Insufficient documentation

## 2016-07-20 LAB — POCT URINALYSIS DIPSTICK
Bilirubin, UA: NEGATIVE
Blood, UA: NEGATIVE
Glucose, UA: NEGATIVE
Ketones, UA: NEGATIVE
Leukocytes, UA: NEGATIVE
Nitrite, UA: POSITIVE
Spec Grav, UA: 1.02
Urobilinogen, UA: NEGATIVE
pH, UA: 6

## 2016-07-20 MED ORDER — METOPROLOL SUCCINATE ER 100 MG PO TB24: 100.0000 mg | ORAL_TABLET | Freq: Every day | ORAL | 3 refills | Status: DC

## 2016-07-20 MED ORDER — LOSARTAN POTASSIUM 50 MG PO TABS: 50.0000 mg | ORAL_TABLET | Freq: Every day | ORAL | 3 refills | Status: DC

## 2016-07-20 MED ORDER — ASPIRIN EC 325 MG PO TBEC: 325.0000 mg | DELAYED_RELEASE_TABLET | Freq: Every day | ORAL | 3 refills | Status: AC

## 2016-07-20 MED ORDER — ASPIRIN EC 325 MG PO TBEC: 325.0000 mg | DELAYED_RELEASE_TABLET | Freq: Every day | ORAL | 3 refills | Status: DC

## 2016-07-20 MED ORDER — METOPROLOL SUCCINATE ER 50 MG PO TB24: 50.0000 mg | ORAL_TABLET | Freq: Every day | ORAL | 3 refills | Status: DC

## 2016-07-20 NOTE — Telephone Encounter (Signed)
Pt does not safely take these kind of medications.  I do not feel comfortable giving her sleeping pills.  She has overdosed in the past.  She abuses her pain medications.  Karena Addison has left a message for pt's daughter in law to clarify the medication changes for today.  Pt to be taking 150mg  of toprol xl and now enteric coated aspirin 325mg  for possible TIA she recently had at [place].  She was also advised to go to the ED if she has a recurrence of her TIA symptoms.

## 2016-07-20 NOTE — Progress Notes (Signed)
Location:  [facility] clinic Provider:  Ecko Beasley L. Mariea Clonts, D.O., C.M.D.  Code Status: full code Goals of Care:  Advanced Directives 07/08/2016  Does Patient Have a Medical Advance Directive? No  Type of Advance Directive -  Does patient want to make changes to medical advance directive? -  Copy of [place] in Chart? -  Would patient like information on creating a medical advance directive? No - Patient declined  Pre-existing out of facility DNR order (yellow form or pink MOST form) -  Some encounter information is confidential and restricted. Go to Review Flowsheets activity to see all data.     Chief Complaint  Patient presents with  . Follow-up    2 week follow-up    HPI: Patient is a 76 y.o. female seen today for medical management of chronic diseases.    As usual, pt canceled her 2 week f/u multiple times.  [name] upset that we have been telling other physicians not to prescribe her narcotics and seems to have not recall about her addiction and opioid dependence due to memory loss.  She managed to gets some from Dr. Sharol Given at one point.  She now has plans for shoulder surgery due to a rotator cuff tear.  [name] had two prednisone injections that helped only a tad.  3/2 is surgery.    BP elevated slightly here and definitely at home ranging from 152-190/80-100.    [place] requests that I call to give permission for her shoulder surgery.  Seems medical clearance would make sense, but I've never heard of this before.    Pt is adamant that she is taking only 100mg  of metoprolol xl not 150mg  even thought that change was made and CMA was to notify her son/daughter in law.    Reports having difficulty picking up left foot with rubber soled shoes at [employer].  Says suddenly she couldn't guide her cart and couldn't use the left arm.  Continued on.  She says she rammed into a shelf.  Checked out and asked for help taking groceries out.  She was so weak she had to  lean on the cart.  Pharmacy staff helped her.  She then had trouble picking the left leg up to get into the car.  Weak when got home and then it resolved within 30 mins.  She noted no droop or speech problems and did not get evaluated.  Though she has gone to the hospital numerous times for pain, she did not go for this.  4/4 has egd/cscope.  Says she had to change it b/c her car wouldn't start.  I'd like to increase aspirin.  No dark stool since stopping iron therapy.  [name] taking 4 of the 500mg  extra strength tylenol every 4 hours during the day--she thinks she's pickled her liver and says her pain is causing her bp to be up.  Says bp up due to her pain, but bp has been up ever since we have tried to get her off narcotics.  Cannot go to sleep at night due to pain.  Has tried ice, heat, tens unit, salonpas, salonpas patch, but says nothing helps.  [name] tearful.  In daytime, says she keeps her mind on other things.  [name] out of the tylenol with codeine and called Dr. Sharol Given yesterday and was told that they would not renew the tylenol with codeine.  [name] out at present.    She reports her son is out  of town and we will not be able to reach him.    Past Medical History:  Diagnosis Date  . Anxiety   . Arthritis    djd  . Cataracts, bilateral    immature   . Cellulitis    10/11/11 hospitalized for cellulitis   . Chronic back pain    scoliosis/stenosis  . Depression    takes Zoloft daily  . Depression   . Dysrhythmia    hx tachy palpitations  . Elbow fracture, left April 2015  . GERD (gastroesophageal reflux disease)    hx pud '98  . Headache(784.0)    migraines yrs ago  . History of bronchitis    many yrs ago  . Hyperlipemia    takes Atorvastatin daily  . Hypertension    takes HCTZ daily  . Insomnia   . Joint pain   . Joint swelling   . Neuromuscular disorder ([place])    peripheral neuropathy, FEET AND LEGS  . Peripheral neuropathy ([place])   . Peripheral neuropathy (HCC)    takes  Gabapentin daily  . 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 Radiculopathy, lumbar region   . Restless leg syndrome    takes Sinemet daily  . Stroke [facility] ([place]    ? 6 months ago - tests okay - [facility]   . Vertigo     Past Surgical History:  Procedure Laterality Date  . ABDOMINAL HYSTERECTOMY  1975   bil oophorectomy  . BACK SURGERY   MANY YRS AGO   laminectomy x3  . CARPAL TUNNEL RELEASE  07/09/2012   Procedure: CARPAL TUNNEL RELEASE;  Surgeon: Magnus Sinning, MD;  [facility];  Service: Orthopedics;  Laterality: Left;  . cervical disc surgery x2  2011  . CHOLECYSTECTOMY  1993  . colonosocpy    . ESOPHAGOGASTRODUODENOSCOPY    . FINGER ARTHROPLASTY  07/09/2012   Procedure: FINGER ARTHROPLASTY;  Surgeon: Magnus Sinning, MD;  [facility];  Service: Orthopedics;  Laterality: Left;  Interposition Arthroplasty CMC Joint Thumb Left   . HEMATOMA EVACUATION  2011   s/p rt cea  . JOINT REPLACEMENT  '01   total knee replacement, LEFT  . left knee arthroscopy  2001  . LIPOMA EXCISION  07/09/2012   Procedure: EXCISION LIPOMA;  Surgeon: Magnus Sinning, MD;  [facility];  Service: Orthopedics;  Laterality: Left;  Excision Lipoma Dorsum Left Wrist   . LUMBAR LAMINECTOMY WITH COFLEX 1 LEVEL Bilateral 04/19/2015   Procedure: LUMBAR TWO-THREE LUMBAR LAMINECTOMY WITH COFLEX ;  Surgeon: Kristeen Miss, MD;  Location: [place] [facility];  Service: Neurosurgery;  Laterality: Bilateral;  Bilateral L23 laminectomy and foraminotomy with coflex  . LUMBAR LAMINECTOMY/DECOMPRESSION MICRODISCECTOMY Right 12/19/2015   Procedure: Right Lumbar two-three  Microdiskectomy;  Surgeon: Kristeen Miss, MD;  Location: [place] [facility];  Service: Neurosurgery;  Laterality: Right;  . ORIF ANKLE FRACTURE Right 09/14/2014   FIBULA   . ORIF ANKLE FRACTURE Right 09/14/2014   Procedure: OPEN REDUCTION INTERNAL FIXATION (ORIF) RIGHT ANKLE FRACTURE/SYNDESMOSIS ;  Surgeon: Wylene Simmer, MD;  Location: [place];  Service:  Orthopedics;  Laterality: Right;  . right knee replacement     09/2011   . rt carotid enarterectomy  2011  . rt knee arthroscopy  '99  . TOTAL KNEE ARTHROPLASTY  09/13/2011   Procedure: TOTAL KNEE ARTHROPLASTY;  Surgeon: Magnus Sinning, MD;  [facility];  Service: Orthopedics;  Laterality: Right;    Allergies  Allergen Reactions  . Contrast Media [Iodinated Diagnostic Agents] Anaphylaxis  . Acyclovir And Related Other (See Comments)    Reaction:  Unknown   . Sulfa Drugs Cross Reactors Nausea And Vomiting    Allergies as of 07/20/2016      Reactions   Contrast Media [iodinated Diagnostic Agents] Anaphylaxis   Acyclovir And Related Other (See Comments)   Reaction:  Unknown    Sulfa Drugs Cross Reactors Nausea And Vomiting      Medication List       Accurate as of 07/20/16 10:51 AM. Always use your most recent med list.          acetaminophen 500 MG tablet Commonly known as:  TYLENOL Take 500 mg by mouth every 8 (eight) hours as needed for moderate pain.   aspirin EC 81 MG tablet Take 81 mg by mouth daily.   atorvastatin 40 MG tablet Commonly known as:  LIPITOR TAKE 1 TABLET BY MOUTH ONCE DAILY FOR CHOLESTEROL   cholecalciferol 1000 units tablet Commonly known as:  VITAMIN D Take 1,000 Units by mouth daily.   gabapentin 400 MG capsule Commonly known as:  NEURONTIN Take 400 mg by mouth 4 (four) times daily.   losartan 50 MG tablet Commonly known as:  COZAAR Take 1 tablet (50 mg total) by mouth daily.   metoprolol 50 MG tablet Commonly known as:  LOPRESSOR Take 50 mg by mouth daily. Take along with 100mg  tablet   metoprolol succinate 100 MG 24 hr tablet Commonly known as:  TOPROL-XL Take 100 mg by mouth daily. Take with 50mg  tablet   pantoprazole 40 MG tablet Commonly known as:  PROTONIX Take 1 tablet (40 mg total) by mouth daily.   pyridOXINE 50 MG tablet Commonly known as:  VITAMIN B-6 Take 50 mg by mouth daily.   SALONPAS EX Apply topically  as needed.   sertraline 50 MG tablet Commonly known as:  ZOLOFT TAKE TWO TABLETS BY MOUTH  ONCE DAILY FOR DEPRESSION   sodium chloride 0.65 % Soln nasal spray Commonly known as:  OCEAN Place 1 spray into both nostrils as needed for congestion.   SUMAtriptan 50 MG tablet Commonly known as:  IMITREX Take 1 tablet (50 mg total) by mouth every 2 (two) hours as needed for migraine. May repeat in once if headache persists or recurs.      Review of Systems:  Review of Systems  Constitutional: Negative for chills, fever and malaise/fatigue.  Eyes: Negative for blurred vision.       Glasses  Respiratory: Negative for shortness of breath.   Cardiovascular: Negative for chest pain, palpitations and leg swelling.  Gastrointestinal: Negative for abdominal pain, blood in stool, constipation and melena.  Genitourinary: Negative for dysuria.  Musculoskeletal: Positive for back pain and joint pain. Negative for falls.       Right shoulder rotator cuff tear; right foot pain; walks with walker  Skin: Negative for itching and rash.  Neurological: Positive for tingling and sensory change. Negative for dizziness, loss of consciousness and weakness.  Endo/Heme/Allergies: Bruises/bleeds easily.  Psychiatric/Behavioral: Positive for depression, memory loss and substance abuse. Negative for suicidal ideas. The patient has insomnia.     Health Maintenance  Topic Date Due  . MAMMOGRAM  12/31/2009  . TETANUS/TDAP  06/03/2017 (Originally 01/26/1960)  . ZOSTAVAX  06/03/2018 (Originally 01/26/2002)  . COLONOSCOPY  01/30/2022  . INFLUENZA VACCINE  Completed  . DEXA SCAN  Completed  . PNA vac Low Risk Adult  Completed  Physical Exam: Vitals:   07/20/16 1037  BP: 140/80  Pulse: 73  Temp: 98.7 F (37.1 C)  TempSrc: Oral  SpO2: 98%  Weight: 202 lb (91.6 kg)   Body mass index is 33.61 kg/m. Physical Exam  Constitutional: [name] oriented to person, place, and time. She appears well-developed and  well-nourished. No distress.  Cardiovascular: Normal rate, regular rhythm, normal heart sounds and intact distal pulses.   Pulmonary/Chest: Effort normal and breath sounds normal. No respiratory distress.  Abdominal: Bowel sounds are normal.  Musculoskeletal: Normal range of motion. She exhibits tenderness.  Right shoulder with decreased ROM  Neurological: [name] alert and oriented to person, place, and time.  Skin: Skin is warm and dry.  Psychiatric:  Tearful when talking about not sleeping at night due to pain    Labs reviewed: Basic Metabolic Panel:  Recent Labs  02/27/16 1446 04/30/16 1640 06/17/16 1054  NA 135 136 137  K 4.9 4.7 3.9  CL 104 104 104  CO2 22 23 20*  GLUCOSE 103* 99 124*  BUN 20 16 18   CREATININE 0.79 0.80 0.58  CALCIUM 9.5 9.4 9.6   Liver Function Tests:  Recent Labs  07/27/15 2047 01/13/16  AST 31 53*  ALT 14 11  ALKPHOS 74 83  BILITOT 0.7  --   PROT 7.1  --   ALBUMIN 4.0  --     Recent Labs  07/27/15 2047  LIPASE 22   No results for input(s): AMMONIA in the last 8760 hours. CBC:  Recent Labs  02/27/16 1446 04/30/16 1640 06/17/16 1054  WBC 6.5 8.9 14.2*  NEUTROABS 3,250 4,806 12.6*  HGB 11.7 12.4 16.2*  HCT 37.1 38.7 49.1*  MCV 96.9 99.7 103.8*  PLT 267 270 259   Lipid Panel: No results for input(s): CHOL, HDL, LDLCALC, TRIG, CHOLHDL, LDLDIRECT in the last 8760 hours. Lab Results  Component Value Date   HGBA1C 6.2 (H) 07/02/2015    Procedures since last visit: Mr Shoulder Right Wo Contrast  Result Date: 07/10/2016 CLINICAL DATA:  Chronic shoulder pain.  No known injury. EXAM: MRI OF THE RIGHT SHOULDER WITHOUT CONTRAST TECHNIQUE: Multiplanar, multisequence MR imaging of the shoulder was performed. No intravenous contrast was administered. COMPARISON:  None. FINDINGS: Rotator cuff: There is rotator cuff tendinopathy which is worst in supraspinatus. A small bursal sided tear is seen in the posterior supraspinatus is identified.  The rotator cuff is otherwise intact. Muscles:  Intact and normal in appearance. Biceps long head: Tendinopathy of the intraarticular segment is noted. Acromioclavicular Joint: Bulky degenerative change is seen. Type 1 acromion. Subacromial spurring is noted. Subacromial/subdeltoid is noted. Glenohumeral Joint: Negative. Labrum:  Intact. Bones:  No fracture or worrisome lesion. Other:  None. IMPRESSION: Rotator cuff tendinopathy with a bursal sided tear of the posterior supraspinatus. Bulky acromioclavicular osteoarthritis with a subacromial spur. Subacromail/subdeltoid fluid consistent with bursitis. Electronically Signed   By: Inge Rise M.D.   On: 07/10/2016 16:31   Assessment/Plan 1. Rotator cuff tear arthropathy of right shoulder -has upcoming surgery -has opioid dependence disorder and takes medication inappropriately when prescribed, also high risk for suicide attempt with poorly controlled htn--she does not follow through with seeing her psychiatrist and psychologist either  2. Uncomplicated opioid dependence ([place]) -ongoing issue, keeps calling all over [place] and showing up in the ED requesting pain meds -she clearly needs a long term psychiatry opioid dependence program but cannot afford one (out of pocket due to medicare) - refuses pain mgt center (has  previously been) -pt with dilated pupils, agitated, flushed and tearful  3. Duodenal ulcer -anemia has improved  -no longer on iron and black stools resolved -pt has put off her EGD/cscope until april  4. Chronic pain of right ankle -ongoing, but better with walking, tens, salonpas  5. Spinal stenosis, lumbar region, with neurogenic claudication -cont conservative treatments  6. Iron deficiency anemia due to chronic blood loss -resolved with iron short term, monitor  7. Hemispheric carotid artery syndrome - had TIA it sounds like - reviewed prior carotid doppler and it had shown a clear left carotid and right she had  endarterectomy on, other vessels clear -bp control still not good--sounds like she is not getting the full dosage of toprol xl 150mg  daily--CMA to clarify with her daughter in law about this and the aspirin dose change for today  - aspirin EC 325 MG tablet; Take 1 tablet (325 mg total) by mouth daily.  Dispense: 90 tablet; Refill: 3  Labs/tests ordered: urine drug screen panel 6 Next appt:  08/06/2016  Anyelin Mogle L. Avante Carneiro, D.O. [facility] [address], [place] [address] Cell Phone (Mon-Fri 8am-5pm):  [phone] On Call:  [phone] & follow prompts after 5pm & weekends Office Phone:  [phone] Office Fax:  [phone]

## 2016-07-20 NOTE — Telephone Encounter (Signed)
Patient was informed of Dr. Cyndi Lennert decision not to provide any pain medication and patient was not happy. Patient then went on to state that her medications were sent to the wrong pharmacy. She stated that her metoprolol 100 mg and cozzar 50 mg needed to be sent to [facility] and not [facility]. I spoke with Karena Addison about this and she stated that she will confirm which pharmacy medications are to be sent to when patient's son calls her back.

## 2016-07-20 NOTE — Addendum Note (Signed)
Addended by: Despina Hidden on: 07/20/2016 04:36 PM   Modules accepted: Orders

## 2016-07-20 NOTE — Telephone Encounter (Signed)
Patient called to ask about a prescription being sent to pharmacy for 50 mg metoprolol. Dee sent that prescription along with her other medications today.   Patient began crying on the phone and asked if Dr. Mariea Clonts could please give her something for pain so that she could sleep at night.   Please advise.

## 2016-07-22 ENCOUNTER — Other Ambulatory Visit: Payer: Self-pay | Admitting: Internal Medicine

## 2016-07-23 ENCOUNTER — Ambulatory Visit: Payer: Self-pay

## 2016-07-23 NOTE — Telephone Encounter (Signed)
Spoke with son and DIL and corrected the pharmacy and discussed change in medication and sent medication to the correct pharmacy per DIL.

## 2016-07-24 ENCOUNTER — Other Ambulatory Visit: Payer: Self-pay | Admitting: Internal Medicine

## 2016-07-24 ENCOUNTER — Encounter: Payer: Self-pay | Admitting: Internal Medicine

## 2016-07-26 ENCOUNTER — Telehealth: Payer: Self-pay | Admitting: *Deleted

## 2016-07-26 LAB — PAIN MGMT, PROFILE 6 W/CONF, U
6 Acetylmorphine: NEGATIVE ng/mL (ref <10)
Alcohol Metabolites: NEGATIVE ng/mL (ref <500)
Alphahydroxyalprazolam: NEGATIVE ng/mL (ref <25)
Alphahydroxymidazolam: NEGATIVE ng/mL (ref <50)
Alphahydroxytriazolam: NEGATIVE ng/mL (ref <50)
Aminoclonazepam: NEGATIVE ng/mL (ref <25)
Amphetamines: NEGATIVE ng/mL (ref <500)
Barbiturates: NEGATIVE ng/mL (ref <300)
Benzodiazepines: NEGATIVE ng/mL (ref <100)
Cocaine Metabolite: NEGATIVE ng/mL (ref <150)
Creatinine: 95.5 mg/dL (ref >=20.0)
Hydroxyethylflurazepam: NEGATIVE ng/mL (ref <50)
Lorazepam: NEGATIVE ng/mL (ref <50)
Marijuana Metabolite: NEGATIVE ng/mL (ref <20)
Methadone Metabolite: NEGATIVE ng/mL (ref <100)
Nordiazepam: NEGATIVE ng/mL (ref <50)
Opiates: NEGATIVE ng/mL (ref <100)
Oxazepam: NEGATIVE ng/mL (ref <50)
Oxidant: NEGATIVE ug/mL (ref <200)
Oxycodone: NEGATIVE ng/mL (ref <100)
Phencyclidine: NEGATIVE ng/mL (ref <25)
Please note:: 0
Temazepam: NEGATIVE ng/mL (ref <50)
pH: 6.04 (ref 4.5–9.0)

## 2016-07-26 NOTE — Telephone Encounter (Signed)
Why does she not want to return to her usual neurologist?  I had also advised that if she had more neurologic symptoms suggesting TIA or stroke, she should go to the ER not tell us afterwards.

## 2016-07-26 NOTE — Telephone Encounter (Signed)
Patient called and stated that she had another TIA yesterday but did not go to the ER. Stated that she would like to go back to a Neurologist but not Dr. Jannifer Franklin. Wants a referral  to go to [facility]. Please Advise.

## 2016-07-27 ENCOUNTER — Telehealth (INDEPENDENT_AMBULATORY_CARE_PROVIDER_SITE_OTHER): Payer: Self-pay | Admitting: Orthopedic Surgery

## 2016-07-27 NOTE — Telephone Encounter (Signed)
Patient states the surgery center wanted her to ask Dr Sharol Given when she need to stop her Asprin before surgery 08/07/16

## 2016-07-27 NOTE — Telephone Encounter (Signed)
Left message on voicemail for patient to return call when available   

## 2016-07-27 NOTE — Telephone Encounter (Signed)
Patient does not want to go to usual neurologist Because she stated that when she last saw Dr. Jannifer Franklin he never once examined her. She wants to start fresh with someone else.   And the reason why she didn't go to the ER she didn't know what they would do. And the symptoms were gone within 45 minutes. Patient stated that if you wanted to call her she will gladly talk to you about it.

## 2016-07-27 NOTE — Telephone Encounter (Signed)
Patient left message on Clinical Intake Voicemail; requesting referral. Patient states if this surgery is put off something will have to be done for the pain. Pain is emotionally breaking patient down (patient was crying on voicemail). Patient was told to continue ASA (FYI)   Please advise

## 2016-07-27 NOTE — Telephone Encounter (Signed)
Please review early message. [name] has a shoulder scope scheduled for 08/07/2016 and has reportedly just had another TIA.  She wanted to know if she needed to stop her 325mg  asa before surgery.  I advised her to continue the aspirin.  [name] in the middle of changing neurologists and does not think she can be seen before her scheduled surgery. She was very tearful and upset on the phone, stating that she is in a lot of pain and really needs to have the surgery as planned.  [name] suggested we hold on surgery until she can be cleared by neurology.  She would like to make sure you agree or can she proceed? If not, she may need Rx for pain medication in the meantime.

## 2016-07-27 NOTE — Telephone Encounter (Signed)
Please review message below and advise.  

## 2016-07-27 NOTE — Telephone Encounter (Signed)
Patient states she went to [facility] and they gave her motrin and had religious material everywhere and she felt that was very inappropriate. Patient said she will not go to pain management at this time. Patient will call back if she wishes to reconsider.   Patient sates she was not really asking for pain medicine. Patient said what she needs is for Dr.[name] to place a referral for [facility]

## 2016-07-27 NOTE — Telephone Encounter (Signed)
Will you advise patient to begin taking baby asa daily. And we will hold on her surgery until after neurology has released her or seen her

## 2016-07-27 NOTE — Telephone Encounter (Signed)
Message about this to erin for review.

## 2016-07-27 NOTE — Telephone Encounter (Signed)
Needs to go to pain management.  I am not prescribing her any controlled substances.

## 2016-07-27 NOTE — Telephone Encounter (Signed)
See e-mail from the pre-op nurse at the [facility]. 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

## 2016-07-29 ENCOUNTER — Other Ambulatory Visit: Payer: Self-pay | Admitting: Internal Medicine

## 2016-07-29 DIAGNOSIS — G451 Carotid artery syndrome (hemispheric): Secondary | ICD-10-CM

## 2016-07-29 NOTE — Telephone Encounter (Signed)
Ok, I will place the neurology referral.  Just don't know why she wants to change neurologists.  Also, we need more details about her second possible TIA since she still did not go to the hospital as recommended for it.  Please call her back and get that information.

## 2016-07-30 NOTE — Telephone Encounter (Signed)
Noted, I placed the referral over the weekend and still recommend that if she has any recurrent symptoms, she must go to the ED.

## 2016-07-30 NOTE — Telephone Encounter (Signed)
Call patient. We can call in a prescription for pain medicine for her but would like her to be cleared neurologically before we proceed with arthroscopy for her shoulder.

## 2016-07-30 NOTE — Telephone Encounter (Signed)
Patient stated With the TIA she had difficulty lifting her left leg. It wouldn't come up far enough to get her shoe off the floor. Difficult to use the left hand to pick up something. Nothing wrong with her speech and no facial drooping. Kinda like her foot and leg was dragging. Resolved in 45 minutes at the most.

## 2016-07-31 ENCOUNTER — Telehealth: Payer: Self-pay

## 2016-07-31 NOTE — Telephone Encounter (Signed)
Patient Aware -

## 2016-07-31 NOTE — Telephone Encounter (Signed)
-----   Message from [name], DO sent at 07/31/2016  4:16 PM EST ----- Almyra Free,   Ms. Haring's bps continue to be a problem.  I'm not understanding why she suddenly started to have such hypertension in the past few months. [name] to be seen by [facility] due to two possible TIAs so her cscope will likely need to be delayed again, unfortunately.   Thanks,   TReed  ----- Message ----- From: Doristine Counter, RN Sent: 07/31/2016   3:36 PM To: [name], DO  Dr. Mariea Clonts, This patient is rescheduled for her EGD/colonoscopy on 09/03/16. Patient had called to cancel last time due to elevated blood pressure. Please advise when this patient is medically cleared with better HTN control.  Thank you for your help, Jena Gauss, RN

## 2016-07-31 NOTE — Telephone Encounter (Signed)
Spoke to patient to let her know we will hold off on scheduling her procedure until she is cleared to do so. I have asked her to contact the office when she has been cleared.

## 2016-07-31 NOTE — Telephone Encounter (Signed)
Okay thanks for letting me know. She should schedule as soon as this is better controlled. thanks

## 2016-08-02 ENCOUNTER — Ambulatory Visit: Payer: Medicare Other | Admitting: Internal Medicine

## 2016-08-03 ENCOUNTER — Telehealth: Payer: Self-pay | Admitting: *Deleted

## 2016-08-03 MED ORDER — LOSARTAN POTASSIUM 50 MG PO TABS
100.0000 mg | ORAL_TABLET | Freq: Every day | ORAL | 3 refills | Status: DC
Start: 2016-08-03 — End: 2016-09-11

## 2016-08-03 NOTE — Telephone Encounter (Signed)
Let's increase losartan to 100mg  po daily from 50mg .  Keep other medications the same.  Please notify pt's son/daughter-in-law

## 2016-08-03 NOTE — Telephone Encounter (Signed)
[name], son and patient notified and agreed. Medication list updated.

## 2016-08-03 NOTE — Telephone Encounter (Signed)
I spoke with [name].  She states that she has an appointment with the neurologist coming up but could not get in before surgery.  She wants to keep surgery as scheduled and states "I have to get out of this pain". States the TIA only lasted about 30 min and only affected her hand and her walking, but only for a short amount of time.  Also she mentions that "the anesthesiologist at the surgical center didn't seem too concerned about it". Ok to keep on as scheduled as patient is requesting?

## 2016-08-03 NOTE — Telephone Encounter (Signed)
Patient called and left message on Clinical Intake Line stating that this morning her blood pressure was 164/82. Stated that the bottom number is always good but the top number is remaining high ever since you started the new medication. She stated that she does not have the headache like she did. Please Advise.

## 2016-08-05 NOTE — Telephone Encounter (Signed)
Ok to continue as scheduled.

## 2016-08-06 ENCOUNTER — Ambulatory Visit: Payer: Medicare Other | Admitting: Internal Medicine

## 2016-08-06 NOTE — Telephone Encounter (Signed)
Tried calling patient numerous times this afternoon with no answer. Will try calling again later.

## 2016-08-07 DIAGNOSIS — M7521 Bicipital tendinitis, right shoulder: Secondary | ICD-10-CM | POA: Diagnosis not present

## 2016-08-07 DIAGNOSIS — M7551 Bursitis of right shoulder: Secondary | ICD-10-CM | POA: Diagnosis not present

## 2016-08-07 DIAGNOSIS — M7541 Impingement syndrome of right shoulder: Secondary | ICD-10-CM | POA: Diagnosis not present

## 2016-08-07 DIAGNOSIS — M75121 Complete rotator cuff tear or rupture of right shoulder, not specified as traumatic: Secondary | ICD-10-CM | POA: Diagnosis not present

## 2016-08-07 DIAGNOSIS — M24011 Loose body in right shoulder: Secondary | ICD-10-CM | POA: Diagnosis not present

## 2016-08-07 DIAGNOSIS — M24111 Other articular cartilage disorders, right shoulder: Secondary | ICD-10-CM | POA: Diagnosis not present

## 2016-08-07 DIAGNOSIS — G8918 Other acute postprocedural pain: Secondary | ICD-10-CM | POA: Diagnosis not present

## 2016-08-07 NOTE — Telephone Encounter (Signed)
Patient had surgery this morning with MD.

## 2016-08-13 ENCOUNTER — Other Ambulatory Visit (INDEPENDENT_AMBULATORY_CARE_PROVIDER_SITE_OTHER): Payer: Self-pay

## 2016-08-13 ENCOUNTER — Telehealth (INDEPENDENT_AMBULATORY_CARE_PROVIDER_SITE_OTHER): Payer: Self-pay | Admitting: Orthopedic Surgery

## 2016-08-13 MED ORDER — TRAMADOL HCL 50 MG PO TABS: 50.0000 mg | ORAL_TABLET | Freq: Two times a day (BID) | ORAL | 0 refills | Status: DC

## 2016-08-13 MED ORDER — METHOCARBAMOL 500 MG PO TABS
500.0000 mg | ORAL_TABLET | Freq: Three times a day (TID) | ORAL | 0 refills | Status: DC | PRN
Start: 2016-08-13 — End: 2016-11-16

## 2016-08-13 NOTE — Telephone Encounter (Signed)
Pt requesting a different pain RX as she is having a reaction to what was prescribed and a muscle relaxer. To [facility] on [address].   [phone]

## 2016-08-13 NOTE — Telephone Encounter (Signed)
Pt states that the Vicodin that Dr. Sharol Given gave rx for is making her itchy and that she has tried benadryl and it is not helping. Ok per [place] for rx tramadol to be called in and pt also request muscle relaxer for the spasms that she is having. Advised ok for robaxin and this was entered into the module as well.

## 2016-08-14 ENCOUNTER — Telehealth (INDEPENDENT_AMBULATORY_CARE_PROVIDER_SITE_OTHER): Payer: Self-pay | Admitting: Orthopedic Surgery

## 2016-08-14 NOTE — Telephone Encounter (Signed)
I called and spoke with patient, she states that the pharmacy now has her prescription for robaxin.

## 2016-08-14 NOTE — Telephone Encounter (Signed)
Patient lmom stating her pharmacy do not have the Rx for the muscle relaxer.

## 2016-08-15 ENCOUNTER — Telehealth (INDEPENDENT_AMBULATORY_CARE_PROVIDER_SITE_OTHER): Payer: Self-pay

## 2016-08-15 NOTE — Telephone Encounter (Signed)
Patient left voicemail on Triage phone and states she had SU done 08/07/16 on her shoulder. She said she "has some drainage coming out from one of the holes where the incision is" would like a CB  [phone]

## 2016-08-21 ENCOUNTER — Inpatient Hospital Stay (INDEPENDENT_AMBULATORY_CARE_PROVIDER_SITE_OTHER): Payer: Medicare Other | Admitting: Orthopedic Surgery

## 2016-08-22 ENCOUNTER — Inpatient Hospital Stay (INDEPENDENT_AMBULATORY_CARE_PROVIDER_SITE_OTHER): Payer: Medicare Other | Admitting: Orthopedic Surgery

## 2016-08-23 ENCOUNTER — Inpatient Hospital Stay (INDEPENDENT_AMBULATORY_CARE_PROVIDER_SITE_OTHER): Payer: Medicare Other | Admitting: Orthopedic Surgery

## 2016-08-24 ENCOUNTER — Inpatient Hospital Stay (INDEPENDENT_AMBULATORY_CARE_PROVIDER_SITE_OTHER): Payer: Medicare Other | Admitting: Family

## 2016-08-27 ENCOUNTER — Other Ambulatory Visit: Payer: Self-pay | Admitting: Internal Medicine

## 2016-08-27 ENCOUNTER — Inpatient Hospital Stay (INDEPENDENT_AMBULATORY_CARE_PROVIDER_SITE_OTHER): Payer: Medicare Other | Admitting: Orthopedic Surgery

## 2016-08-27 NOTE — Telephone Encounter (Signed)
I called [facility] to find out when patient had medications filled last. The pharmacy tech stated that patient's last refill of gabapentin 400 mg was on 07/09/16 by Dr. Mariea Clonts. Rx was sent to pharmacy for atorvastatin 40mg . However, Dr. Mariea Clonts stated to refuse the gabapentin for now since patient received #[id number] on 07/09/16 (3 month supply). Refill will be give closer to 3 month mark of last rx 07/09/16.

## 2016-08-30 ENCOUNTER — Ambulatory Visit (INDEPENDENT_AMBULATORY_CARE_PROVIDER_SITE_OTHER): Payer: Medicare Other | Admitting: Orthopedic Surgery

## 2016-09-03 ENCOUNTER — Encounter: Payer: Medicare Other | Admitting: Gastroenterology

## 2016-09-05 ENCOUNTER — Ambulatory Visit (INDEPENDENT_AMBULATORY_CARE_PROVIDER_SITE_OTHER): Payer: Medicare Other | Admitting: Orthopedic Surgery

## 2016-09-06 ENCOUNTER — Ambulatory Visit (INDEPENDENT_AMBULATORY_CARE_PROVIDER_SITE_OTHER): Payer: Medicare Other | Admitting: Orthopedic Surgery

## 2016-09-10 ENCOUNTER — Telehealth: Payer: Self-pay | Admitting: *Deleted

## 2016-09-10 NOTE — Telephone Encounter (Signed)
Patient called c/o high blood pressure  1. What are your blood pressure readings? Today 4/9- 181/98, Has been staying over 160/90 every time she takes it  2. When were the above readings checked?  4-9-181/98 @2 :19pm  4-7-175/83 @7pm   4-7-190/106 @12pm   4-4-165/84 @ 3:47pm  4-3-164/94 @1pm   3-28-145/72 @12 :40pm  3-26-167/91 @1 :00pm  3-26-181/97 @11am   3 Any associated symptoms like headache, dizziness, chest pain, shortness of breath, weakness or numbness of leg/arm, or speech changes? Having Headaches in back of head. No weakness/numbness. No SOB, No speech changes.  Has appointment with Neurology at the end of month 4/25  4. Did you take your blood pressure medications (if patient on b/p medication, check medication list)? Taking all medications as directed, her son helps her take them on time.   5. Did you take them at least a hour prior to checking your blood pressure?Yes  6. Have you been watching your salt in your diet?Yes, no added salt  7. Have you eaten any salty foods like breakfast meats, ham, soups, canned foods, or snacks? Had some can soup but not often.   I will forward your response to your provider and call you with instructions, if your symptoms persist or progress seek medical attention at urgent care or the emergency room.   Please Advise.

## 2016-09-10 NOTE — Telephone Encounter (Signed)
Increase losartan up to 100mg  po daily. Continue to check and call back in one week with results.

## 2016-09-11 ENCOUNTER — Telehealth (INDEPENDENT_AMBULATORY_CARE_PROVIDER_SITE_OTHER): Payer: Self-pay | Admitting: *Deleted

## 2016-09-11 MED ORDER — AMLODIPINE BESYLATE 10 MG PO TABS: 10.0000 mg | ORAL_TABLET | Freq: Every day | ORAL | 0 refills | Status: DC

## 2016-09-11 MED ORDER — AMLODIPINE BESYLATE 10 MG PO TABS: 10.0000 mg | ORAL_TABLET | Freq: Every day | ORAL | 3 refills | Status: DC

## 2016-09-11 MED ORDER — LOSARTAN POTASSIUM 100 MG PO TABS: 100.0000 mg | ORAL_TABLET | Freq: Every day | ORAL | 3 refills | Status: DC

## 2016-09-11 MED ORDER — LOSARTAN POTASSIUM 100 MG PO TABS
100.0000 mg | ORAL_TABLET | Freq: Every day | ORAL | 0 refills | Status: DC
Start: 2016-09-11 — End: 2016-09-21

## 2016-09-11 NOTE — Telephone Encounter (Signed)
Dr. Sharol Given will discuss patient refill at her appointment tomorrow.

## 2016-09-11 NOTE — Telephone Encounter (Signed)
Let's add amlodipine 10mg .  I could not tell she was on 100mg  until I clicked on it so I thought she was on 50mg . My mistake.  Of course, she is not reliable to give information to or get information from so we must call her son or daughter in law to inform them of the changes.  Please go over all of the blood pressure medicines with them when you call them b/c often there is confusion b/c of too many changes between appointments and no shows to appointments.

## 2016-09-11 NOTE — Telephone Encounter (Signed)
Patient is already taking Losartan 100mg  daily. Please Advise.

## 2016-09-11 NOTE — Telephone Encounter (Signed)
Pt calling stating she is needing her flexeril refilled but wanted to tell Dr. Sharol Given this is not for her surgery that it is for her left knee. Pt has appt tomorrow 4/11

## 2016-09-11 NOTE — Telephone Encounter (Signed)
Patient notified and agreed. Shawn, daughter in law notified and agreed (she does patient's pill box) she also stated that patient needs a refill on her losartan called to [facility]. Rx's faxed to pharmacy.

## 2016-09-12 ENCOUNTER — Inpatient Hospital Stay (INDEPENDENT_AMBULATORY_CARE_PROVIDER_SITE_OTHER): Payer: Medicare Other | Admitting: Orthopedic Surgery

## 2016-09-13 ENCOUNTER — Inpatient Hospital Stay (INDEPENDENT_AMBULATORY_CARE_PROVIDER_SITE_OTHER): Payer: Medicare Other | Admitting: Orthopedic Surgery

## 2016-09-19 ENCOUNTER — Telehealth: Payer: Self-pay | Admitting: *Deleted

## 2016-09-19 NOTE — Telephone Encounter (Signed)
Patient called and stated that her RLS is acting up and really bothering her and causing her not to sleep. Wants to know if something can be given to help with this. Taking medications as prescribed.  Please Advise.

## 2016-09-19 NOTE — Telephone Encounter (Signed)
mirapex which is the medication for this is likely to cause sedation and falls.  I recommend she drink tonic water or eat a spoonful of mustard before bed to see if that helps.

## 2016-09-20 NOTE — Telephone Encounter (Signed)
Called patient x 2 line busy, I will try to call patient again later

## 2016-09-21 ENCOUNTER — Other Ambulatory Visit: Payer: Self-pay

## 2016-09-21 MED ORDER — LOSARTAN POTASSIUM 100 MG PO TABS: 100.0000 mg | ORAL_TABLET | Freq: Every day | ORAL | 1 refills | Status: DC

## 2016-09-21 NOTE — Telephone Encounter (Signed)
Patient aware rx sent  

## 2016-09-26 ENCOUNTER — Ambulatory Visit: Payer: Self-pay | Admitting: Neurology

## 2016-09-26 NOTE — Telephone Encounter (Signed)
Called patient. Answering machine picked up but it beeped and cut off.

## 2016-09-28 NOTE — Telephone Encounter (Signed)
Patient notified and agreed.  

## 2016-10-08 ENCOUNTER — Other Ambulatory Visit: Payer: Self-pay | Admitting: Internal Medicine

## 2016-10-18 ENCOUNTER — Ambulatory Visit: Payer: Medicare Other | Admitting: Internal Medicine

## 2016-10-29 ENCOUNTER — Other Ambulatory Visit: Payer: Self-pay | Admitting: Internal Medicine

## 2016-10-31 ENCOUNTER — Ambulatory Visit: Payer: Self-pay

## 2016-11-01 ENCOUNTER — Encounter: Payer: Self-pay | Admitting: Internal Medicine

## 2016-11-01 ENCOUNTER — Encounter: Payer: Medicare Other | Admitting: Internal Medicine

## 2016-11-05 ENCOUNTER — Encounter: Payer: Medicare Other | Admitting: Internal Medicine

## 2016-11-05 ENCOUNTER — Encounter: Payer: Self-pay | Admitting: Internal Medicine

## 2016-11-06 ENCOUNTER — Other Ambulatory Visit: Payer: Self-pay | Admitting: Internal Medicine

## 2016-11-10 ENCOUNTER — Emergency Department (HOSPITAL_BASED_OUTPATIENT_CLINIC_OR_DEPARTMENT_OTHER)
Admission: EM | Admit: 2016-11-10 | Discharge: 2016-11-10 | Disposition: A | Discharge status: Home / Self Care | Payer: Medicare Other | Attending: Emergency Medicine | Admitting: Emergency Medicine

## 2016-11-10 ENCOUNTER — Emergency Department (HOSPITAL_BASED_OUTPATIENT_CLINIC_OR_DEPARTMENT_OTHER): Payer: Medicare Other

## 2016-11-10 ENCOUNTER — Encounter (HOSPITAL_BASED_OUTPATIENT_CLINIC_OR_DEPARTMENT_OTHER): Payer: Self-pay

## 2016-11-10 DIAGNOSIS — I10 Essential (primary) hypertension: Secondary | ICD-10-CM | POA: Diagnosis not present

## 2016-11-10 DIAGNOSIS — Z79899 Other long term (current) drug therapy: Secondary | ICD-10-CM | POA: Diagnosis not present

## 2016-11-10 DIAGNOSIS — M25552 Pain in left hip: Secondary | ICD-10-CM | POA: Insufficient documentation

## 2016-11-10 DIAGNOSIS — S79912A Unspecified injury of left hip, initial encounter: Secondary | ICD-10-CM | POA: Diagnosis not present

## 2016-11-10 MED ORDER — LIDOCAINE 5 % EX PTCH: 1.0000 | MEDICATED_PATCH | CUTANEOUS | 0 refills | Status: DC

## 2016-11-10 MED ORDER — KETOROLAC TROMETHAMINE 30 MG/ML IJ SOLN
15.0000 mg | Freq: Once | INTRAMUSCULAR | Status: AC
  Administered 2016-11-10: 15 mg via INTRAMUSCULAR
  Filled 2016-11-10: qty 1

## 2016-11-10 MED ORDER — LIDOCAINE 5 % EX PTCH
1.0000 | MEDICATED_PATCH | CUTANEOUS | Status: DC
  Filled 2016-11-10: qty 1

## 2016-11-10 NOTE — ED Triage Notes (Signed)
Pt reports lower back pain that radiates to left hip after fall on Wednesday or Thursday against a table. PT states Dr Sharol Given called in Robaxin at 0200 but it was ineffective. Painful ROM, but able to use walker. Pt reports 5 surgeries to her lower back.

## 2016-11-10 NOTE — ED Provider Notes (Addendum)
[place] DEPT [facility] Provider Note   CSN: [id number] Arrival date & time: 11/10/16  0803     History   Chief Complaint Chief Complaint  Patient presents with  . Hip Pain    HPI [name] is a 75 y.o. female.  HPI Patient presents to the emergency room for evaluation of left hip pain. Patient states she fell a couple days ago landing on her left hip. She does have a history of chronic back pain but does not think that is causing her pain. The pain is primarily in the left buttock region. It increases whenever she tries to walk. She called her orthopedic doctor and was given a prescription for Robaxin but that does not help. She denies any other injuries. No fevers or chills. No numbness or weakness. Past Medical History:  Diagnosis Date  . Anxiety   . Arthritis    djd  . Cataracts, bilateral    immature   . Cellulitis    10/11/11 hospitalized for cellulitis   . Chronic back pain    scoliosis/stenosis  . Depression    takes Zoloft daily  . Depression   . Dysrhythmia    hx tachy palpitations  . Elbow fracture, left April 2015  . GERD (gastroesophageal reflux disease)    hx pud '98  . Headache(784.0)    migraines yrs ago  . History of bronchitis    many yrs ago  . Hyperlipemia    takes Atorvastatin daily  . Hypertension    takes HCTZ daily  . Insomnia   . Joint pain   . Joint swelling   . Neuromuscular disorder ([place])    peripheral neuropathy, FEET AND LEGS  . Peripheral neuropathy   . Peripheral neuropathy    takes Gabapentin daily  . 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 Radiculopathy, lumbar region   . Restless leg syndrome    takes Sinemet daily  . Stroke [facility])    ? 6 months ago - tests okay - [facility]   . Vertigo     Patient Active Problem List   Diagnosis Date Noted  . Uncomplicated opioid dependence ([place]) 07/20/2016  . Rotator cuff tear arthropathy of right shoulder 07/20/2016  . Nontraumatic incomplete tear of right rotator cuff  07/17/2016  . Acute pain of right shoulder 06/27/2016  . Impingement syndrome of right shoulder 06/11/2016  . Colon cancer screening 05/17/2016  . Heme positive stool 05/17/2016  . Esophageal ulcer with bleeding 05/17/2016  . Duodenal ulcer 05/17/2016  . Acute blood loss anemia 01/18/2016  . Herniated nucleus pulposus, L2-3 right 12/19/2015  . Right foot pain 07/22/2015  . Chest pain 07/11/2015  . History of drug overdose 07/07/2015  . Frequent falls 07/07/2015  . Hyperlipidemia 07/07/2015  . Essential hypertension 07/07/2015  . Spinal stenosis, lumbar region, with neurogenic claudication 07/07/2015  . Severe episode of recurrent major depressive disorder, with psychotic features ([place]) 07/07/2015  . Visual hallucination 07/01/2015  . Delirium   . Severe episode of recurrent major depressive disorder, without psychotic features ([place])   . Back pain 06/20/2015  . MDD (major depressive disorder), recurrent episode, severe ([place]) 06/16/2015  . Lumbar stenosis 04/19/2015  . Hypokalemia 09/19/2014  . Constipation 09/19/2014  . Acute encephalopathy 09/18/2014  . Acute respiratory failure ([place]) 09/17/2014  . OSA (obstructive sleep apnea) 09/17/2014  . Ankle fracture, lateral malleolus, closed 09/14/2014  . Major depressive disorder, recurrent severe without psychotic features ([place]) 08/07/2014  .  Insomnia 08/05/2014  . Hip joint painful on movement 08/05/2014  . Degenerative disc disease, lumbar 08/05/2014  . Severe obesity (BMI >= 40) ([place]) 01/11/2014  . Depression   . Arthritis   . Polyneuropathy in other diseases classified elsewhere ([place]) 04/08/2013  . Restless legs syndrome (RLS) 04/08/2013  . UTI (lower urinary tract infection) 10/14/2011  . Dizziness 10/12/2011  . Fall 10/12/2011  . TIA (transient ischemic attack) 10/12/2011  . Cellulitis 10/12/2011  . GERD (gastroesophageal reflux disease) 10/12/2011    Past Surgical History:  Procedure Laterality Date  . ABDOMINAL  HYSTERECTOMY  1975   bil oophorectomy  . BACK SURGERY   MANY YRS AGO   laminectomy x3  . CARPAL TUNNEL RELEASE  07/09/2012   Procedure: CARPAL TUNNEL RELEASE;  Surgeon: Magnus Sinning, MD;  Location: [facility];  Service: Orthopedics;  Laterality: Left;  . cervical disc surgery x2  2011  . CHOLECYSTECTOMY  1993  . colonosocpy    . ESOPHAGOGASTRODUODENOSCOPY    . FINGER ARTHROPLASTY  07/09/2012   Procedure: FINGER ARTHROPLASTY;  Surgeon: Magnus Sinning, MD;  [facility]: [facility];  Service: Orthopedics;  Laterality: Left;  Interposition Arthroplasty CMC Joint Thumb Left   . HEMATOMA EVACUATION  2011   s/p rt cea  . JOINT REPLACEMENT  '01   total knee replacement, LEFT  . left knee arthroscopy  2001  . LIPOMA EXCISION  07/09/2012   Procedure: EXCISION LIPOMA;  Surgeon: Magnus Sinning, MD;  [facility];  Service: Orthopedics;  Laterality: Left;  Excision Lipoma Dorsum Left Wrist   . LUMBAR LAMINECTOMY WITH COFLEX 1 LEVEL Bilateral 04/19/2015   Procedure: LUMBAR TWO-THREE LUMBAR LAMINECTOMY WITH COFLEX ;  Surgeon: Kristeen Miss, MD;  Location: [place] [facility];  Service: Neurosurgery;  Laterality: Bilateral;  Bilateral L23 laminectomy and foraminotomy with coflex  . LUMBAR LAMINECTOMY/DECOMPRESSION MICRODISCECTOMY Right 12/19/2015   Procedure: Right Lumbar two-three  Microdiskectomy;  Surgeon: Kristeen Miss, MD;  Location: [place] [facility];  Service: Neurosurgery;  Laterality: Right;  . ORIF ANKLE FRACTURE Right 09/14/2014   FIBULA   . ORIF ANKLE FRACTURE Right 09/14/2014   Procedure: OPEN REDUCTION INTERNAL FIXATION (ORIF) RIGHT ANKLE FRACTURE/SYNDESMOSIS ;  Surgeon: Wylene Simmer, MD;  Location: [place];  Service: Orthopedics;  Laterality: Right;  . right knee replacement     09/2011   . rt carotid enarterectomy  2011  . rt knee arthroscopy  '99  . SHOULDER ARTHROSCOPY Right   . TOTAL KNEE ARTHROPLASTY  09/13/2011   Procedure: TOTAL KNEE ARTHROPLASTY;  Surgeon: Magnus Sinning, MD;  [facility];   Service: Orthopedics;  Laterality: Right;    OB History    No data available       Home Medications    Prior to Admission medications   Medication Sig Start Date End Date Taking? Authorizing Provider  acetaminophen (TYLENOL) 500 MG tablet Take 500 mg by mouth every 8 (eight) hours as needed for moderate pain. Not to exceed 3000mg  per day total (6 pills)   Yes [provider]  amLODipine (NORVASC) 10 MG tablet Take 1 tablet (10 mg total) by mouth daily. For blood pressure 09/11/16  Yes [name], [name] L, DO  aspirin EC 325 MG tablet Take 1 tablet (325 mg total) by mouth daily. 07/20/16  Yes [name], [name] L, DO  atorvastatin (LIPITOR) 40 MG tablet TAKE 1 TABLET BY MOUTH ONCE DAILY FOR CHOLESTEROL 08/27/16  Yes [name], [name] L, DO  cholecalciferol (VITAMIN D) 1000 units tablet Take  1,000 Units by mouth daily.   Yes [provider]  gabapentin (NEURONTIN) 400 MG capsule TAKE 1 CAPSULE BY MOUTH 4  TIMES DAILY FOR PAIN  MANAGEMENT OR AGITATION 11/07/16  Yes [name], [name] L, DO  Liniments (SALONPAS EX) Apply topically as needed.   Yes [provider]  losartan (COZAAR) 100 MG tablet Take 1 tablet (100 mg total) by mouth daily. 09/21/16  Yes [name], [name] L, DO  methocarbamol (ROBAXIN) 500 MG tablet Take 1 tablet (500 mg total) by mouth every 8 (eight) hours as needed for muscle spasms. 08/13/16  Yes Suzan Slick, NP  metoprolol succinate (TOPROL-XL) 100 MG 24 hr tablet Take 1 tablet (100 mg total) by mouth daily. Take with 50mg  tablet 07/20/16  Yes [name], [name] L, DO  metoprolol succinate (TOPROL-XL) 50 MG 24 hr tablet Take 1 tablet (50 mg total) by mouth daily. Take with 100mg  dose once daily 07/20/16  Yes [name], [name] L, DO  pyridOXINE (VITAMIN B-6) 50 MG tablet Take 50 mg by mouth daily.   Yes [provider]  sertraline (ZOLOFT) 50 MG tablet TAKE TWO TABLETS BY MOUTH  ONCE DAILY FOR DEPRESSION 02/27/16  Yes [name], [name] L, DO  sodium chloride (OCEAN) 0.65 % SOLN  nasal spray Place 1 spray into both nostrils as needed for congestion. 06/28/15  Yes Lindell Spar I, NP  SUMAtriptan (IMITREX) 50 MG tablet TAKE 1 TABLET (50 MG TOTAL) BY MOUTH AS NEEDED FOR MIGRAINE OR HEADACHE 07/24/16  Yes [name], [name] L, DO  lidocaine (LIDODERM) 5 % Place 1 patch onto the skin daily. Remove & Discard patch within 12 hours or as directed by MD 11/10/16   Dorie Rank, MD  lisinopril (PRINIVIL,ZESTRIL) 5 MG tablet TAKE 1 TABLET BY MOUTH  DAILY FOR HIGH BLOOD  PRESSURE 10/30/16   [name], [name] L, DO  pantoprazole (PROTONIX) 40 MG tablet TAKE 1 TABLET BY MOUTH  DAILY 10/30/16   [name], [name] L, DO  traMADol (ULTRAM) 50 MG tablet Take 1 tablet (50 mg total) by mouth 2 (two) times daily. 08/13/16   Suzan Slick, NP    Family History Family History  Problem Relation Age of Onset  . Congestive Heart Failure Father   . Congestive Heart Failure Sister   . Alzheimer's disease Mother   . Diabetes Brother   . Cancer Brother   . Testicular cancer Brother   . Arthritis Brother   . Cancer Brother   . Diabetes Brother   . Prostate cancer Brother     Social History Social History  Substance Use Topics  . Smoking status: Never Smoker  . Smokeless tobacco: Never Used  . Alcohol use No     Allergies   Contrast media [iodinated diagnostic agents]; Acyclovir and related; and Sulfa drugs cross reactors   Review of Systems Review of Systems  All other systems reviewed and are negative.    Physical Exam Updated Vital Signs BP (!) 158/79 (BP Location: Left Arm)   Pulse 81   Temp 98 F (36.7 C) (Oral)   Resp 16   Ht 1.651 m (5\' 5" )   Wt 72.6 kg (160 lb)   SpO2 98%   BMI 26.63 kg/m   Physical Exam  Constitutional: She appears well-developed and well-nourished. No distress.  HENT:  Head: Normocephalic and atraumatic.  Right Ear: External ear normal.  Left Ear: External ear normal.  Eyes: Conjunctivae are normal. Right eye exhibits no discharge. Left eye exhibits no  discharge. No scleral icterus.  Neck: Neck supple. No tracheal deviation present.  Cardiovascular: Normal rate.   Pulmonary/Chest: Effort normal. No stridor. No respiratory distress.  Abdominal: She exhibits no distension.  Musculoskeletal: She exhibits no edema.       Left hip: She exhibits tenderness and bony tenderness. She exhibits normal range of motion, no swelling, no crepitus and no deformity.       Lumbar back: Normal.  Neurological: [name] alert. Cranial nerve deficit: no gross deficits.  Skin: Skin is warm and dry. No rash noted.  Psychiatric: She has a normal mood and affect.  Nursing note and vitals reviewed.    ED Treatments / Results    Radiology Dg Hip Unilat With Pelvis 2-3 Views Left  Result Date: 11/10/2016 CLINICAL DATA:  Left hip pain since a fall against a table 11/07/2016. Initial encounter. EXAM: DG HIP (WITH OR WITHOUT PELVIS) 2-3V LEFT COMPARISON:  CT abdomen and pelvis 07/27/2015. FINDINGS: The hips are located. No fracture. No focal bony lesion. 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 Lower lumbar spondylosis is noted. IMPRESSION: No acute abnormality. Electronically Signed   By: Inge Rise M.D.   On: 11/10/2016 09:33    Procedures Procedures (including critical care time)  Medications Ordered in ED Medications  lidocaine (LIDODERM) 5 % 1 patch (1 patch Transdermal Not Given 11/10/16 0952)  ketorolac (TORADOL) 30 MG/ML injection 15 mg (not administered)     Initial Impression / Assessment and Plan / ED Course  I have reviewed the triage vital signs and the nursing notes.  Pertinent labs & imaging results that were available during my care of the patient were reviewed by me and considered in my medical decision making (see chart for details).  Clinical Course as of Nov 10 952  Sat Nov 10, 2016  0821 Controlled substance caution noted   [JK]  0954 Pt requests a shot of pain  medications.  Prior creatinine was normal.  Will give a low dose of toradol  [JK]    Clinical Course User Index [JK] Dorie Rank, MD    Pt has been able to walk and bear weight.  Low suspicion for occult fx.  Hx of chronic pain issues. Will rx lidocaine patch.  Follow up with her doctors if not better in the next week.  Final Clinical Impressions(s) / ED Diagnoses   Final diagnoses:  Left hip pain    New Prescriptions New Prescriptions   LIDOCAINE (LIDODERM) 5 %    Place 1 patch onto the skin daily. Remove & Discard patch within 12 hours or as directed by MD     Dorie Rank, MD 11/10/16 7026    Dorie Rank, MD 11/10/16 [phone]

## 2016-11-10 NOTE — ED Notes (Signed)
Patient transported to MRI 

## 2016-11-10 NOTE — Discharge Instructions (Signed)
Take the medications as needed for pain, follow up with your doctor if not improving over the next week

## 2016-11-11 ENCOUNTER — Other Ambulatory Visit (INDEPENDENT_AMBULATORY_CARE_PROVIDER_SITE_OTHER): Payer: Self-pay | Admitting: Specialist

## 2016-11-11 MED ORDER — TRAMADOL HCL 50 MG PO TABS: 50.0000 mg | ORAL_TABLET | Freq: Four times a day (QID) | ORAL | 0 refills | Status: DC | PRN

## 2016-11-16 ENCOUNTER — Other Ambulatory Visit (INDEPENDENT_AMBULATORY_CARE_PROVIDER_SITE_OTHER): Payer: Self-pay | Admitting: Orthopedic Surgery

## 2016-11-19 NOTE — Telephone Encounter (Signed)
Pt was last in office 07/2016 with a partial RTC tear. Do you wish to refill?

## 2016-11-22 ENCOUNTER — Ambulatory Visit: Payer: Self-pay | Admitting: Nurse Practitioner

## 2016-11-28 ENCOUNTER — Telehealth (INDEPENDENT_AMBULATORY_CARE_PROVIDER_SITE_OTHER): Payer: Self-pay | Admitting: Orthopedic Surgery

## 2016-11-28 ENCOUNTER — Telehealth (INDEPENDENT_AMBULATORY_CARE_PROVIDER_SITE_OTHER): Payer: Self-pay

## 2016-11-28 ENCOUNTER — Other Ambulatory Visit (INDEPENDENT_AMBULATORY_CARE_PROVIDER_SITE_OTHER): Payer: Self-pay | Admitting: Orthopaedic Surgery

## 2016-11-28 MED ORDER — TIZANIDINE HCL 4 MG PO TABS: 4.0000 mg | ORAL_TABLET | Freq: Two times a day (BID) | ORAL | 0 refills | Status: DC | PRN

## 2016-11-28 NOTE — Telephone Encounter (Signed)
Patient called needing Rx refilled (Tramadol) for left hip pain. Patient also said the insurance will no longer pay for Robaxin. Patient ask for another muscle relaxer. Patient uses the [facility] on [address]. The number to contact patient is [phone]

## 2016-11-28 NOTE — Telephone Encounter (Signed)
Patient called stating that she is needing a Rx for pain Left Hip.  Stated that she is out of tramadol.  Please advise.  Cb# is [phone].

## 2016-11-28 NOTE — Telephone Encounter (Signed)
Talked with patient and appointment is scheduled for Friday 11/30/16.

## 2016-11-28 NOTE — Telephone Encounter (Signed)
Can you please call pt and make appt for Dr. Sharol Given tomorrow or Friday or Erin? She was seen in the ER 11/10/16 needs follow up in office cannot give rx for pain medication.

## 2016-11-29 NOTE — Telephone Encounter (Signed)
Pt has been sch for follow up in office from er visit.

## 2016-11-30 ENCOUNTER — Ambulatory Visit: Payer: Medicare Other | Admitting: Internal Medicine

## 2016-11-30 ENCOUNTER — Ambulatory Visit (INDEPENDENT_AMBULATORY_CARE_PROVIDER_SITE_OTHER): Payer: Medicare Other | Admitting: Family

## 2016-12-04 ENCOUNTER — Telehealth (INDEPENDENT_AMBULATORY_CARE_PROVIDER_SITE_OTHER): Payer: Self-pay | Admitting: Orthopedic Surgery

## 2016-12-04 NOTE — Telephone Encounter (Signed)
Pt has appt 12/06/16

## 2016-12-04 NOTE — Telephone Encounter (Signed)
I called and spoke with patient advised that she needs in office evaluation. No muscle relaxer to be sent in at this time.

## 2016-12-04 NOTE — Telephone Encounter (Signed)
PT WANTS TO KNOW IF YOU CAN SEND HER A MUSCLE RELAXER, MADE HER AN APPT Thursday.    [phone]

## 2016-12-06 ENCOUNTER — Ambulatory Visit: Payer: Self-pay | Admitting: Neurology

## 2016-12-06 ENCOUNTER — Ambulatory Visit (INDEPENDENT_AMBULATORY_CARE_PROVIDER_SITE_OTHER): Payer: Medicare Other | Admitting: Family

## 2016-12-12 ENCOUNTER — Ambulatory Visit: Payer: Self-pay | Admitting: Neurology

## 2016-12-20 ENCOUNTER — Encounter: Payer: Medicare Other | Admitting: Internal Medicine

## 2016-12-26 ENCOUNTER — Other Ambulatory Visit: Payer: Self-pay | Admitting: Internal Medicine

## 2016-12-27 ENCOUNTER — Encounter: Payer: Self-pay | Admitting: Internal Medicine

## 2016-12-31 ENCOUNTER — Other Ambulatory Visit: Payer: Self-pay | Admitting: Internal Medicine

## 2016-12-31 ENCOUNTER — Telehealth: Payer: Self-pay | Admitting: Internal Medicine

## 2016-12-31 ENCOUNTER — Encounter: Payer: Medicare Other | Admitting: Internal Medicine

## 2016-12-31 NOTE — Telephone Encounter (Signed)
Yes, patient has canceled her physical last minute on 6 occasions and numerous other appts last minute.  Additionally, she continues to visit the ED and other practices requesting controlled substances for pain management in violation of her pain contract.  She has historically taken too much of her medications and it was felt she was not safe to be prescribed these medications.  We have given her a previous second chance but she continued with her behavior.

## 2016-12-31 NOTE — Telephone Encounter (Signed)
Pt has been dismissed, I gave a 30 day supply until she can find a new provider.

## 2017-01-22 ENCOUNTER — Ambulatory Visit: Payer: Self-pay | Admitting: Family Medicine

## 2017-01-25 ENCOUNTER — Other Ambulatory Visit (INDEPENDENT_AMBULATORY_CARE_PROVIDER_SITE_OTHER): Payer: Self-pay | Admitting: Orthopaedic Surgery

## 2017-01-25 MED ORDER — TIZANIDINE HCL 4 MG PO TABS: 4.0000 mg | ORAL_TABLET | Freq: Four times a day (QID) | ORAL | 0 refills | Status: DC | PRN

## 2017-01-30 ENCOUNTER — Ambulatory Visit: Payer: Medicare Other | Admitting: Emergency Medicine

## 2017-01-30 ENCOUNTER — Ambulatory Visit: Payer: Medicare Other | Admitting: Family Medicine

## 2017-02-08 ENCOUNTER — Ambulatory Visit: Payer: Medicare Other | Admitting: Family Medicine

## 2017-02-14 ENCOUNTER — Ambulatory Visit: Payer: Medicare Other | Admitting: Family Medicine

## 2017-02-14 IMAGING — NM NM PULMONARY VENT & PERF
16 series · 16 of 16 positions shown · non-contrast
Comparison: Radiographs 07/11/2015.

CLINICAL DATA: Chest pain with elevated D-dimer levels. Evaluate
for pulmonary embolism.

EXAM:
NUCLEAR MEDICINE VENTILATION - PERFUSION LUNG SCAN
TECHNIQUE: Ventilation images were obtained in multiple projections using
inhaled aerosol Ec-OOm DTPA. Perfusion images were obtained in
multiple projections after intravenous injection of Ec-OOm MAA.
RADIOPHARMACEUTICALS:  30.1 6echnetium-77m DTPA aerosol inhalation
and 4.1 6echnetium-77m MAA IV

[Series 1: ant/post vent · 4.14mm/px · 1 of 1 slices shown (1 of 2)]
[im 1/1]
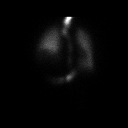

[Series 1: ant/post vent · 4.14mm/px · 1 of 1 slices shown (2 of 2)]
[im 1/1]
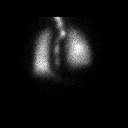

[Series 2: lao/rpo vent · 4.14mm/px · 1 of 1 slices shown (1 of 2)]
[im 1/1]
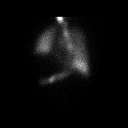

[Series 2: lao/rpo vent · 4.14mm/px · 1 of 1 slices shown (2 of 2)]
[im 1/1]
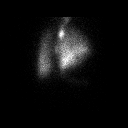

[Series 3: lpo/rao vent · 4.14mm/px · 1 of 1 slices shown (1 of 2)]
[im 1/1]
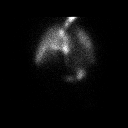

[Series 3: lpo/rao vent · 4.14mm/px · 1 of 1 slices shown (2 of 2)]
[im 1/1]
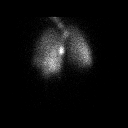

[Series 4: lt lat/rt lat vent · 4.14mm/px · 1 of 1 slices shown (1 of 2)]
[im 1/1]
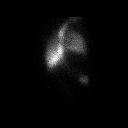

[Series 4: lt lat/rt lat vent · 4.14mm/px · 1 of 1 slices shown (2 of 2)]
[im 1/1]
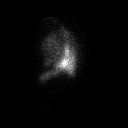

[Series 5: lt lat/rt lat perf · 4.14mm/px · 1 of 1 slices shown (1 of 2)]
[im 1/1]
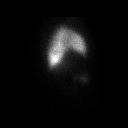

[Series 5: lt lat/rt lat perf · 4.14mm/px · 1 of 1 slices shown (2 of 2)]
[im 1/1]
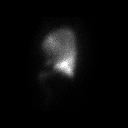

[Series 6: lpo/rao perf · 4.14mm/px · 1 of 1 slices shown (1 of 2)]
[im 1/1]
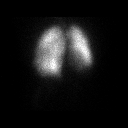

[Series 6: lpo/rao perf · 4.14mm/px · 1 of 1 slices shown (2 of 2)]
[im 1/1]
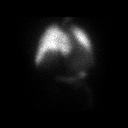

[Series 7: ant/post perf · 4.14mm/px · 1 of 1 slices shown (1 of 2)]
[im 1/1]
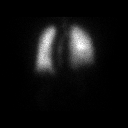

[Series 7: ant/post perf · 4.14mm/px · 1 of 1 slices shown (2 of 2)]
[im 1/1]
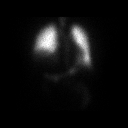

[Series 8: lao/rpo perf · 4.14mm/px · 1 of 1 slices shown (1 of 2)]
[im 1/1]
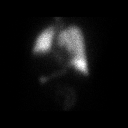

[Series 8: lao/rpo perf · 4.14mm/px · 1 of 1 slices shown (2 of 2)]
[im 1/1]
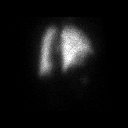

[16 of 16 positions shown; findings below may reference images not displayed]

FINDINGS: Ventilation: No focal ventilation defect. Some activity is noted
within the esophagus and stomach.

Perfusion: No wedge shaped peripheral perfusion defects to suggest
acute pulmonary embolism.
IMPRESSION: Very low probability for acute pulmonary embolism.

## 2017-02-27 NOTE — Progress Notes (Deleted)
No chief complaint on file.   HPI  Past Medical History:  Diagnosis Date  . Anxiety   . Arthritis    djd  . Cataracts, bilateral    immature   . Cellulitis    10/11/11 hospitalized for cellulitis   . Chronic back pain    scoliosis/stenosis  . Depression    takes Zoloft daily  . Depression   . Dysrhythmia    hx tachy palpitations  . Elbow fracture, left April 2015  . GERD (gastroesophageal reflux disease)    hx pud '98  . Headache(784.0)    migraines yrs ago  . History of bronchitis    many yrs ago  . Hyperlipemia    takes Atorvastatin daily  . Hypertension    takes HCTZ daily  . Insomnia   . Joint pain   . Joint swelling   . Neuromuscular disorder ([place])    peripheral neuropathy, FEET AND LEGS  . Peripheral neuropathy   . Peripheral neuropathy    takes Gabapentin daily  . 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 Radiculopathy, lumbar region   . Restless leg syndrome    takes Sinemet daily  . Stroke [facility])    ? 6 months ago - tests okay - [facility]   . Vertigo     Current Outpatient Prescriptions  Medication Sig Dispense Refill  . acetaminophen (TYLENOL) 500 MG tablet Take 500 mg by mouth every 8 (eight) hours as needed for moderate pain. Not to exceed 3000mg  per day total (6 pills)    . amLODipine (NORVASC) 10 MG tablet Take 1 tablet (10 mg total) by mouth daily. For blood pressure 90 tablet 3  . aspirin EC 325 MG tablet Take 1 tablet (325 mg total) by mouth daily. 90 tablet 3  . atorvastatin (LIPITOR) 40 MG tablet TAKE 1 TABLET BY MOUTH ONCE DAILY FOR CHOLESTEROL 30 tablet 0  . cholecalciferol (VITAMIN D) 1000 units tablet Take 1,000 Units by mouth daily.    Marland Kitchen gabapentin (NEURONTIN) 400 MG capsule TAKE 1 CAPSULE BY MOUTH 4  TIMES DAILY FOR PAIN  MANAGEMENT OR AGITATION 360 capsule 0  . lidocaine (LIDODERM) 5 % Place 1 patch onto the skin daily. Remove & Discard patch within 12 hours or as directed by MD 10 patch 0  . Liniments (SALONPAS EX) Apply  topically as needed.    Marland Kitchen lisinopril (PRINIVIL,ZESTRIL) 5 MG tablet TAKE 1 TABLET BY MOUTH  DAILY FOR HIGH BLOOD  PRESSURE 90 tablet 1  . losartan (COZAAR) 100 MG tablet TAKE 1 TABLET BY MOUTH  DAILY 30 tablet 0  . methocarbamol (ROBAXIN) 500 MG tablet TAKE 1 TABLET BY MOUTH EVERY 8 HOURS AS NEEDED FOR PAIN/SPASMS 20 tablet 0  . metoprolol succinate (TOPROL-XL) 100 MG 24 hr tablet Take 1 tablet (100 mg total) by mouth daily. Take with 50mg  tablet 90 tablet 3  . metoprolol succinate (TOPROL-XL) 50 MG 24 hr tablet Take 1 tablet (50 mg total) by mouth daily. Take with 100mg  dose once daily 90 tablet 3  . pantoprazole (PROTONIX) 40 MG tablet TAKE 1 TABLET BY MOUTH  DAILY 90 tablet 1  . pyridOXINE (VITAMIN B-6) 50 MG tablet Take 50 mg by mouth daily.    . sertraline (ZOLOFT) 50 MG tablet TAKE 2 TABLETS BY MOUTH  ONCE DAILY FOR DEPRESSION 60 tablet 0  . sodium chloride (OCEAN) 0.65 % SOLN nasal spray Place 1 spray into both nostrils as needed for congestion.  0  . SUMAtriptan (IMITREX) 50 MG tablet TAKE 1 TABLET (50 MG TOTAL) BY MOUTH AS NEEDED FOR MIGRAINE OR HEADACHE 30 tablet 1  . tiZANidine (ZANAFLEX) 4 MG tablet Take 1 tablet (4 mg total) by mouth 2 (two) times daily as needed for muscle spasms. 10 tablet 0  . tiZANidine (ZANAFLEX) 4 MG tablet Take 1 tablet (4 mg total) by mouth every 6 (six) hours as needed for muscle spasms. 30 tablet 0  . traMADol (ULTRAM) 50 MG tablet Take 1 tablet (50 mg total) by mouth every 6 (six) hours as needed. 60 tablet 0   No current facility-administered medications for this visit.     Allergies:  Allergies  Allergen Reactions  . Contrast Media [Iodinated Diagnostic Agents] Anaphylaxis  . Acyclovir And Related Other (See Comments)    Reaction:  Unknown   . Sulfa Drugs Cross Reactors Nausea And Vomiting    Past Surgical History:  Procedure Laterality Date  . ABDOMINAL HYSTERECTOMY  1975   bil oophorectomy  . BACK SURGERY   MANY YRS AGO   laminectomy x3  .  CARPAL TUNNEL RELEASE  07/09/2012   Procedure: CARPAL TUNNEL RELEASE;  Surgeon: Magnus Sinning, MD;  [facility];  Service: Orthopedics;  Laterality: Left;  . cervical disc surgery x2  2011  . CHOLECYSTECTOMY  1993  . colonosocpy    . ESOPHAGOGASTRODUODENOSCOPY    . FINGER ARTHROPLASTY  07/09/2012   Procedure: FINGER ARTHROPLASTY;  Surgeon: Magnus Sinning, MD;  [facility];  Service: Orthopedics;  Laterality: Left;  Interposition Arthroplasty CMC Joint Thumb Left   . HEMATOMA EVACUATION  2011   s/p rt cea  . JOINT REPLACEMENT  '01   total knee replacement, LEFT  . left knee arthroscopy  2001  . LIPOMA EXCISION  07/09/2012   Procedure: EXCISION LIPOMA;  Surgeon: Magnus Sinning, MD;  [facility];  Service: Orthopedics;  Laterality: Left;  Excision Lipoma Dorsum Left Wrist   . LUMBAR LAMINECTOMY WITH COFLEX 1 LEVEL Bilateral 04/19/2015   Procedure: LUMBAR TWO-THREE LUMBAR LAMINECTOMY WITH COFLEX ;  Surgeon: Kristeen Miss, MD;  Location: [place] [facility];  Service: Neurosurgery;  Laterality: Bilateral;  Bilateral L23 laminectomy and foraminotomy with coflex  . LUMBAR LAMINECTOMY/DECOMPRESSION MICRODISCECTOMY Right 12/19/2015   Procedure: Right Lumbar two-three  Microdiskectomy;  Surgeon: Kristeen Miss, MD;  Location: [place] [facility];  Service: Neurosurgery;  Laterality: Right;  . ORIF ANKLE FRACTURE Right 09/14/2014   FIBULA   . ORIF ANKLE FRACTURE Right 09/14/2014   Procedure: OPEN REDUCTION INTERNAL FIXATION (ORIF) RIGHT ANKLE FRACTURE/SYNDESMOSIS ;  Surgeon: Wylene Simmer, MD;  Location: [place];  Service: Orthopedics;  Laterality: Right;  . right knee replacement     09/2011   . rt carotid enarterectomy  2011  . rt knee arthroscopy  '99  . SHOULDER ARTHROSCOPY Right   . TOTAL KNEE ARTHROPLASTY  09/13/2011   Procedure: TOTAL KNEE ARTHROPLASTY;  Surgeon: Magnus Sinning, MD;  [facility];  Service: Orthopedics;  Laterality: Right;    Social History   Social History  . Marital  status: Widowed    Spouse name: N/A  . Number of children: 2  . Years of education: 64   Social History Main Topics  . Smoking status: Never Smoker  . Smokeless tobacco: Never Used  . Alcohol use No  . Drug use: No  . Sexual activity: No   Other Topics Concern  . Not on file   Social History Narrative  Patient is widowed and lives alone.   Patient has two sons.   Patient is retired.   Patient has a college education.   Patient is right-handed.   Patient drinks three glasses of Coke-soda daily.          ROS  Objective: There were no vitals filed for this visit.  Physical Exam  Assessment and Plan There are no diagnoses linked to this encounter.   [name] P [employer]

## 2017-02-28 ENCOUNTER — Encounter: Payer: Self-pay | Admitting: Internal Medicine

## 2017-02-28 ENCOUNTER — Ambulatory Visit: Payer: Medicare Other | Admitting: Family Medicine

## 2017-03-01 ENCOUNTER — Other Ambulatory Visit: Payer: Self-pay | Admitting: Internal Medicine

## 2017-03-04 ENCOUNTER — Other Ambulatory Visit (INDEPENDENT_AMBULATORY_CARE_PROVIDER_SITE_OTHER): Payer: Self-pay | Admitting: Orthopaedic Surgery

## 2017-03-04 MED ORDER — METHOCARBAMOL 500 MG PO TABS: 500.0000 mg | ORAL_TABLET | Freq: Four times a day (QID) | ORAL | 0 refills | Status: DC | PRN

## 2017-03-07 ENCOUNTER — Ambulatory Visit: Payer: Medicare Other | Admitting: Family Medicine

## 2017-03-14 ENCOUNTER — Other Ambulatory Visit: Payer: Self-pay | Admitting: Internal Medicine

## 2017-03-14 ENCOUNTER — Ambulatory Visit: Payer: Medicare Other | Admitting: Family Medicine

## 2017-03-18 ENCOUNTER — Telehealth: Payer: Self-pay | Admitting: Family Medicine

## 2017-03-18 NOTE — Telephone Encounter (Signed)
Dr. Juleen China discussed with me her concerns about the patient's multiple cancellations and history with asking for narcotics. Dr. Juleen China asked me to call the patient to advise her that "Dr. Juleen China will agree to having her establish care only if she understands and agrees that she is not able to cancel any appointments and will not be able to get any narcotics."  I called the patient and advised her that her appointment is 03/20/17 at 2:45pm and that Dr. Juleen China has agreed to see her only if she does not cancel any appointments and understands that she is not able to get any narcotics.   Patient stated "she understands and will not cancel appointments and understands she can not get any narcotics."   No further action required at this time.

## 2017-03-20 ENCOUNTER — Telehealth: Payer: Self-pay | Admitting: Family Medicine

## 2017-03-20 ENCOUNTER — Ambulatory Visit: Payer: Medicare Other | Admitting: Family Medicine

## 2017-03-20 DIAGNOSIS — Z0289 Encounter for other administrative examinations: Secondary | ICD-10-CM

## 2017-03-20 NOTE — Telephone Encounter (Signed)
Dr. Erin Hearing,  [name] would like for you to be her PCP. She has very good things about you and was told you are the best doctor in [place]. She said that if you can not take her, she would like to pick out someone who you would recommend for her. jw

## 2017-03-21 NOTE — Telephone Encounter (Signed)
See phone note from 10/15 regarding patients agreement about seeing Dr Juleen China.   Would not be appropriate for me to agree to accept her as a patient at the [facility].  Please let patient know  Thanks  [place]

## 2017-03-23 ENCOUNTER — Ambulatory Visit: Payer: Medicare Other | Admitting: Family Medicine

## 2017-04-15 ENCOUNTER — Ambulatory Visit: Payer: Self-pay | Admitting: Family Medicine

## 2017-04-17 ENCOUNTER — Other Ambulatory Visit: Payer: Self-pay | Admitting: Internal Medicine

## 2017-04-17 ENCOUNTER — Ambulatory Visit: Payer: Self-pay | Admitting: Family Medicine

## 2017-04-18 ENCOUNTER — Ambulatory Visit: Payer: Self-pay | Admitting: Family Medicine

## 2017-04-22 ENCOUNTER — Ambulatory Visit: Payer: Self-pay | Admitting: Family Medicine

## 2017-04-30 ENCOUNTER — Encounter: Payer: Self-pay | Admitting: Nurse Practitioner

## 2017-04-30 ENCOUNTER — Ambulatory Visit: Payer: Medicare Other | Admitting: Nurse Practitioner

## 2017-04-30 VITALS — BP 146/86 | HR 81 | Temp 99.1°F | Ht 65.0 in | Wt 221.0 lb

## 2017-04-30 DIAGNOSIS — E785 Hyperlipidemia, unspecified: Secondary | ICD-10-CM

## 2017-04-30 DIAGNOSIS — R519 Headache, unspecified: Secondary | ICD-10-CM

## 2017-04-30 DIAGNOSIS — K2100 Gastro-esophageal reflux disease with esophagitis, without bleeding: Secondary | ICD-10-CM

## 2017-04-30 DIAGNOSIS — D500 Iron deficiency anemia secondary to blood loss (chronic): Secondary | ICD-10-CM

## 2017-04-30 DIAGNOSIS — Z23 Encounter for immunization: Secondary | ICD-10-CM

## 2017-04-30 DIAGNOSIS — G63 Polyneuropathy in diseases classified elsewhere: Secondary | ICD-10-CM

## 2017-04-30 DIAGNOSIS — F332 Major depressive disorder, recurrent severe without psychotic features: Secondary | ICD-10-CM

## 2017-04-30 DIAGNOSIS — I10 Essential (primary) hypertension: Secondary | ICD-10-CM | POA: Diagnosis not present

## 2017-04-30 DIAGNOSIS — R296 Repeated falls: Secondary | ICD-10-CM

## 2017-04-30 DIAGNOSIS — R51 Headache: Secondary | ICD-10-CM

## 2017-04-30 LAB — BASIC METABOLIC PANEL
BUN: 19 mg/dL (ref 6–23)
CO2: 24 meq/L (ref 19–32)
Calcium: 10.4 mg/dL (ref 8.4–10.5)
Chloride: 100 mEq/L (ref 96–112)
Creatinine, Ser: 0.94 mg/dL (ref 0.40–1.20)
GFR: 61.66 mL/min (ref >60.00)
GLUCOSE: 107 mg/dL — AB (ref 70–99)
POTASSIUM: 3.9 meq/L (ref 3.5–5.1)
SODIUM: 137 meq/L (ref 135–145)

## 2017-04-30 LAB — CBC
HCT: 43.8 % (ref 36.0–46.0)
Hemoglobin: 14.3 g/dL (ref 12.0–15.0)
MCHC: 32.6 g/dL (ref 30.0–36.0)
MCV: 106.9 fl — AB (ref 78.0–100.0)
PLATELETS: 313 10*3/uL (ref 150.0–400.0)
RBC: 4.1 Mil/uL (ref 3.87–5.11)
RDW: 14 % (ref 11.5–15.5)
WBC: 10.8 10*3/uL — AB (ref 4.0–10.5)

## 2017-04-30 LAB — IBC PANEL
IRON: 115 ug/dL (ref 42–145)
SATURATION RATIOS: 26.2 % (ref 20.0–50.0)
TRANSFERRIN: 314 mg/dL (ref 212.0–360.0)

## 2017-04-30 LAB — TSH: TSH: 1.94 u[IU]/mL (ref 0.35–4.50)

## 2017-04-30 NOTE — Patient Instructions (Addendum)
Stable lab results. Increase gabapentin to 600mg  TID.  Polypharmacy???

## 2017-04-30 NOTE — Progress Notes (Addendum)
Subjective:  Patient ID: [name], female    DOB: Mar 23, 1941  Age: 76 y.o. MRN: [id number]  CC: Establish Care (est care--medication follow up/refills:gabapentin--flu shot. )   HPI Ms. Macari is here to establish care. She also needs her medications refilled. She lives alone, but has 2adult sons who help her (1 lives in [place] and the other in [place]). 1 of her sons helps to pay her bills and dispense medications in medication box. [name] still able to drive.Has life alert necklace in case she fall. Ambulates with walker and cane when out of her home. Last fall 3 weeks ago due to untied shoe lace, trauma to left cheek which resulted in hematoma. Denies any LOC or altered mental status. She tries to ambulate in her neighborhood 3x/week, but afraid to fall again, hence does not exercise as much as she will love to. [name] interested in PT to improve gait and strength.  HTN: stable with losartan, metoprolol, and amlodipine. BP Readings from Last 3 Encounters:  04/30/17 (!) 146/86  11/10/16 (!) 143/78  07/20/16 140/80   Peripheral neuropathy: Chronic. Unknown cause. She reports she has been evaluated by neurology Some improvement with gabapentin. Currently taking 1600mg  per day. [name] requesting for dose to be increased.  Hyperlipidemia: No adverse effects with atorvastatin.  GERD: Stable symptome with pantoprazole.  Depression: Stable with zoloft.  Headache: Use of imitrex prn.  Outpatient Medications Prior to Visit  Medication Sig Dispense Refill  . acetaminophen (TYLENOL) 500 MG tablet Take 500 mg by mouth every 8 (eight) hours as needed for moderate pain. Not to exceed 3000mg  per day total (6 pills)    . aspirin EC 325 MG tablet Take 1 tablet (325 mg total) by mouth daily. 90 tablet 3  . cholecalciferol (VITAMIN D) 1000 units tablet Take 1,000 Units by mouth daily.    . Liniments (SALONPAS EX) Apply topically as needed.    . pyridOXINE (VITAMIN B-6) 50  MG tablet Take 50 mg by mouth daily.    . sodium chloride (OCEAN) 0.65 % SOLN nasal spray Place 1 spray into both nostrils as needed for congestion.  0  . amLODipine (NORVASC) 10 MG tablet Take 1 tablet (10 mg total) by mouth daily. For blood pressure 90 tablet 3  . atorvastatin (LIPITOR) 40 MG tablet TAKE 1 TABLET BY MOUTH ONCE DAILY FOR CHOLESTEROL 30 tablet 0  . gabapentin (NEURONTIN) 400 MG capsule TAKE 1 CAPSULE BY MOUTH 4  TIMES DAILY FOR PAIN  MANAGEMENT OR AGITATION 360 capsule 0  . lisinopril (PRINIVIL,ZESTRIL) 5 MG tablet TAKE 1 TABLET BY MOUTH  DAILY FOR HIGH BLOOD  PRESSURE 90 tablet 1  . losartan (COZAAR) 100 MG tablet TAKE 1 TABLET BY MOUTH  DAILY 30 tablet 0  . metoprolol succinate (TOPROL-XL) 100 MG 24 hr tablet Take 1 tablet (100 mg total) by mouth daily. Take with 50mg  tablet 90 tablet 3  . metoprolol succinate (TOPROL-XL) 50 MG 24 hr tablet Take 1 tablet (50 mg total) by mouth daily. Take with 100mg  dose once daily 90 tablet 3  . pantoprazole (PROTONIX) 40 MG tablet TAKE 1 TABLET BY MOUTH  DAILY 90 tablet 1  . sertraline (ZOLOFT) 50 MG tablet TAKE 2 TABLETS BY MOUTH  ONCE DAILY FOR DEPRESSION 60 tablet 0  . SUMAtriptan (IMITREX) 50 MG tablet TAKE 1 TABLET (50 MG TOTAL) BY MOUTH AS NEEDED FOR MIGRAINE OR HEADACHE 30 tablet 1  . lidocaine (LIDODERM) 5 % Place 1 patch  onto the skin daily. Remove & Discard patch within 12 hours or as directed by MD (Patient not taking: Reported on 04/30/2017) 10 patch 0  . methocarbamol (ROBAXIN) 500 MG tablet Take 1 tablet (500 mg total) by mouth every 6 (six) hours as needed for muscle spasms. (Patient not taking: Reported on 04/30/2017) 20 tablet 0  . tiZANidine (ZANAFLEX) 4 MG tablet Take 1 tablet (4 mg total) by mouth 2 (two) times daily as needed for muscle spasms. (Patient not taking: Reported on 04/30/2017) 10 tablet 0  . tiZANidine (ZANAFLEX) 4 MG tablet Take 1 tablet (4 mg total) by mouth every 6 (six) hours as needed for muscle spasms.  (Patient not taking: Reported on 04/30/2017) 30 tablet 0  . traMADol (ULTRAM) 50 MG tablet Take 1 tablet (50 mg total) by mouth every 6 (six) hours as needed. (Patient not taking: Reported on 04/30/2017) 60 tablet 0   No facility-administered medications prior to visit.     ROS See HPI  Objective:  BP (!) 146/86   Pulse 81   Temp 99.1 F (37.3 C)   Ht 5\' 5"  (1.651 m)   Wt 221 lb (100.2 kg)   SpO2 96%   BMI 36.78 kg/m   BP Readings from Last 3 Encounters:  04/30/17 (!) 146/86  11/10/16 (!) 143/78  07/20/16 140/80    Wt Readings from Last 3 Encounters:  04/30/17 221 lb (100.2 kg)  11/10/16 160 lb (72.6 kg)  07/20/16 202 lb (91.6 kg)    Physical Exam  Constitutional: [name] oriented to person, place, and time. No distress.  Cardiovascular: Normal rate and regular rhythm.  Pulmonary/Chest: Effort normal and breath sounds normal.  Musculoskeletal: She exhibits edema.  Unsteady gait. Bilateral LE weakness (4-3/5)  Neurological: [name] alert and oriented to person, place, and time. She displays no tremor.  Psychiatric: She has a normal mood and affect. Her behavior is normal. Thought content normal.  Vitals reviewed.   Lab Results  Component Value Date   WBC 10.8 (H) 04/30/2017   HGB 14.3 04/30/2017   HCT 43.8 04/30/2017   PLT 313.0 04/30/2017   GLUCOSE 107 (H) 04/30/2017   CHOL 117 07/02/2015   TRIG 152 (H) 07/02/2015   HDL 37 (L) 07/02/2015   LDLCALC 50 07/02/2015   ALT 11 01/13/2016   AST 53 (A) 01/13/2016   NA 137 04/30/2017   K 3.9 04/30/2017   CL 100 04/30/2017   CREATININE 0.94 04/30/2017   BUN 19 04/30/2017   CO2 24 04/30/2017   TSH 1.94 04/30/2017   INR 1.13 07/02/2015   HGBA1C 6.2 (H) 07/02/2015    Dg Hip Unilat With Pelvis 2-3 Views Left  Result Date: 11/10/2016 CLINICAL DATA:  Left hip pain since a fall against a table 11/07/2016. Initial encounter. EXAM: DG HIP (WITH OR WITHOUT PELVIS) 2-3V LEFT COMPARISON:  CT abdomen and pelvis 07/27/2015.  FINDINGS: The hips are located. No fracture. No focal bony lesion. 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 Lower lumbar spondylosis is noted. IMPRESSION: No acute abnormality. Electronically Signed   By: Inge Rise M.D.   On: 11/10/2016 09:33    Assessment & Plan:   Adelayde was seen today for establish care.  Diagnoses and all orders for this visit:  Essential hypertension -     Basic metabolic panel -     amLODipine (NORVASC) 10 MG tablet; Take 1 tablet (10 mg total) by mouth daily. For blood pressure -  metoprolol succinate (TOPROL-XL) 100 MG 24 hr tablet; Take 1 tablet (100 mg total) by mouth daily. Take with 50mg  tablet -     losartan (COZAAR) 100 MG tablet; Take 1 tablet (100 mg total) by mouth daily. -     metoprolol succinate (TOPROL-XL) 50 MG 24 hr tablet; Take 1 tablet (50 mg total) by mouth daily. 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  Major depressive disorder, recurrent severe without psychotic features ([place]) -     sertraline (ZOLOFT) 50 MG tablet; Take 1 tablet (50 mg total) by mouth daily.  Iron deficiency anemia due to chronic blood loss -     CBC -     IBC panel  Need for influenza vaccination -     Flu vaccine HIGH DOSE PF  Frequent falls -     Ambulatory referral to Physical Therapy  Hyperlipidemia, unspecified hyperlipidemia type -     atorvastatin (LIPITOR) 40 MG tablet; Take 1 tablet (40 mg total) by mouth daily at 6 PM.  Gastroesophageal reflux disease with esophagitis -     pantoprazole (PROTONIX) 40 MG tablet; Take 1 tablet (40 mg total) by mouth daily.  Chronic intractable headache, unspecified headache type -     SUMAtriptan (IMITREX) 50 MG tablet; TAKE 1 TABLET (50 MG  TOTAL) BY MOUTH AS NEEDED FOR MIGRAINE OR HEADACHE   I have discontinued Mechele Claude L. Dung's lisinopril, lidocaine, traMADol, tiZANidine, gabapentin, tiZANidine, and methocarbamol. I have also changed her atorvastatin, pantoprazole, losartan, and sertraline. Additionally, I am having her start on gabapentin. Lastly, I am having her maintain her sodium chloride, cholecalciferol, pyridOXINE, Liniments (SALONPAS EX), acetaminophen, aspirin EC, amLODipine, metoprolol succinate, metoprolol succinate, and SUMAtriptan.  Meds ordered this encounter  Medications  . amLODipine (NORVASC) 10 MG tablet    Sig: Take 1 tablet (10 mg total) by mouth daily. For blood pressure    Dispense:  90 tablet    Refill:  1    Order Specific Question:   Supervising Provider    Answer:   Lucille Passy [[id number]]  . atorvastatin (LIPITOR) 40 MG tablet    Sig: Take 1 tablet (40 mg total) by mouth daily at 6 PM.    Dispense:  90 tablet    Refill:  1    Order Specific Question:   Supervising Provider    Answer:   Lucille Passy [[id number]]  . metoprolol succinate (TOPROL-XL) 100 MG 24 hr tablet    Sig: Take 1 tablet (100 mg total) by mouth daily. Take with 50mg  tablet    Dispense:  90 tablet    Refill:  1    Order Specific Question:   Supervising Provider    Answer:   Lucille Passy [[id number]]  . pantoprazole (PROTONIX) 40 MG tablet    Sig: Take 1 tablet (40 mg total) by mouth daily.    Dispense:  90 tablet    Refill:  1    Order Specific Question:   Supervising Provider    Answer:   Lucille Passy [[id number]]  . losartan (COZAAR) 100 MG tablet    Sig: Take 1 tablet (100 mg total) by mouth daily.    Dispense:  90 tablet    Refill:  1  Order Specific Question:   Supervising Provider    Answer:   Lucille Passy [[id number]]  . sertraline (ZOLOFT) 50 MG tablet    Sig: Take 1 tablet (50 mg total) by mouth daily.    Dispense:  90 tablet    Refill:  1    Order Specific Question:   Supervising Provider    Answer:   Lucille Passy [[id number]]  .  gabapentin (NEURONTIN) 600 MG tablet    Sig: Take 1 tablet (600 mg total) by mouth 3 (three) times daily.    Dispense:  270 tablet    Refill:  1    Order Specific Question:   Supervising Provider    Answer:   Lucille Passy [[id number]]  . metoprolol succinate (TOPROL-XL) 50 MG 24 hr tablet    Sig: Take 1 tablet (50 mg total) by mouth daily. Take with 100mg  dose once daily    Dispense:  90 tablet    Refill:  1    Order Specific Question:   Supervising Provider    Answer:   Lucille Passy [[id number]]  . SUMAtriptan (IMITREX) 50 MG tablet    Sig: TAKE 1 TABLET (50 MG TOTAL) BY MOUTH AS NEEDED FOR MIGRAINE OR HEADACHE    Dispense:  20 tablet    Refill:  0    Order Specific Question:   Supervising Provider    Answer:   Lucille Passy [[id number]]    Follow-up: Return in about 6 months (around 10/28/2017) for HTN, , hyperlipidemia (fasting).  Wilfred Lacy, NP

## 2017-05-01 ENCOUNTER — Encounter: Payer: Self-pay | Admitting: Nurse Practitioner

## 2017-05-01 ENCOUNTER — Other Ambulatory Visit: Payer: Self-pay | Admitting: Nurse Practitioner

## 2017-05-01 DIAGNOSIS — R51 Headache: Principal | ICD-10-CM

## 2017-05-01 DIAGNOSIS — G8929 Other chronic pain: Secondary | ICD-10-CM

## 2017-05-01 MED ORDER — METOPROLOL SUCCINATE ER 50 MG PO TB24: 50.0000 mg | ORAL_TABLET | Freq: Every day | ORAL | 1 refills | Status: DC

## 2017-05-01 MED ORDER — SUMATRIPTAN SUCCINATE 50 MG PO TABS: ORAL_TABLET | ORAL | 0 refills | Status: DC

## 2017-05-01 MED ORDER — METOPROLOL SUCCINATE ER 100 MG PO TB24: 100.0000 mg | ORAL_TABLET | Freq: Every day | ORAL | 1 refills | Status: DC

## 2017-05-01 MED ORDER — ATORVASTATIN CALCIUM 40 MG PO TABS: 40.0000 mg | ORAL_TABLET | Freq: Every day | ORAL | 1 refills | Status: DC

## 2017-05-01 MED ORDER — LOSARTAN POTASSIUM 100 MG PO TABS: 100.0000 mg | ORAL_TABLET | Freq: Every day | ORAL | 1 refills | Status: DC

## 2017-05-01 MED ORDER — PANTOPRAZOLE SODIUM 40 MG PO TBEC: 40.0000 mg | DELAYED_RELEASE_TABLET | Freq: Every day | ORAL | 1 refills | Status: DC

## 2017-05-01 MED ORDER — SERTRALINE HCL 50 MG PO TABS: 50.0000 mg | ORAL_TABLET | Freq: Every day | ORAL | 1 refills | Status: DC

## 2017-05-01 MED ORDER — GABAPENTIN 600 MG PO TABS: 600.0000 mg | ORAL_TABLET | Freq: Three times a day (TID) | ORAL | 1 refills | Status: DC

## 2017-05-01 MED ORDER — AMLODIPINE BESYLATE 10 MG PO TABS: 10.0000 mg | ORAL_TABLET | Freq: Every day | ORAL | 1 refills | Status: DC

## 2017-05-01 NOTE — Addendum Note (Signed)
Addended by: Wilfred Lacy L on: 05/01/2017 03:16 PM   Modules accepted: Orders

## 2017-05-02 ENCOUNTER — Encounter: Payer: Self-pay | Admitting: Nurse Practitioner

## 2017-05-15 ENCOUNTER — Ambulatory Visit: Payer: Medicare Other | Admitting: Physical Therapy

## 2017-05-17 ENCOUNTER — Telehealth: Payer: Self-pay | Admitting: Nurse Practitioner

## 2017-05-17 NOTE — Telephone Encounter (Signed)
Spoke with pt, advise her that the dosage was up from 400 mg to 600 mg TID per [place]. Pt verbalized understand and will update [name] about this med.

## 2017-05-17 NOTE — Telephone Encounter (Signed)
Copied from [place]. Topic: Quick Communication - See Telephone Encounter >> May 17, 2017 12:02 PM Bea Graff, NT wrote: CRM for notification. See Telephone encounter for: Pt states she usually takes her gabapentin 4 times a day but it was ordered for 3 times per day and she wants to see if this is correct? Please advise.   05/17/17.

## 2017-05-24 ENCOUNTER — Telehealth: Payer: Self-pay | Admitting: Nurse Practitioner

## 2017-05-24 DIAGNOSIS — E785 Hyperlipidemia, unspecified: Secondary | ICD-10-CM

## 2017-05-24 DIAGNOSIS — K2100 Gastro-esophageal reflux disease with esophagitis, without bleeding: Secondary | ICD-10-CM

## 2017-05-24 DIAGNOSIS — G63 Polyneuropathy in diseases classified elsewhere: Secondary | ICD-10-CM

## 2017-05-24 DIAGNOSIS — I10 Essential (primary) hypertension: Secondary | ICD-10-CM

## 2017-05-24 DIAGNOSIS — F332 Major depressive disorder, recurrent severe without psychotic features: Secondary | ICD-10-CM

## 2017-05-24 NOTE — Telephone Encounter (Signed)
Copied from [place] [phone]. Topic: Inquiry >> May 24, 2017  2:58 PM Malena Catholic I, NT wrote: Reason for CRM:pt call jan pt will change to [facility] and want all her Refill be sent to [facility]

## 2017-05-29 MED ORDER — PANTOPRAZOLE SODIUM 40 MG PO TBEC: 40.0000 mg | DELAYED_RELEASE_TABLET | Freq: Every day | ORAL | 0 refills | Status: DC

## 2017-05-29 MED ORDER — AMLODIPINE BESYLATE 10 MG PO TABS: 10.0000 mg | ORAL_TABLET | Freq: Every day | ORAL | 0 refills | Status: DC

## 2017-05-29 MED ORDER — SERTRALINE HCL 50 MG PO TABS: 50.0000 mg | ORAL_TABLET | Freq: Every day | ORAL | 0 refills | Status: DC

## 2017-05-29 MED ORDER — ATORVASTATIN CALCIUM 40 MG PO TABS: 40.0000 mg | ORAL_TABLET | Freq: Every day | ORAL | 0 refills | Status: DC

## 2017-05-29 MED ORDER — METOPROLOL SUCCINATE ER 100 MG PO TB24: 100.0000 mg | ORAL_TABLET | Freq: Every day | ORAL | 0 refills | Status: DC

## 2017-05-29 MED ORDER — METOPROLOL SUCCINATE ER 50 MG PO TB24: 50.0000 mg | ORAL_TABLET | Freq: Every day | ORAL | 0 refills | Status: DC

## 2017-05-29 MED ORDER — GABAPENTIN 600 MG PO TABS: 600.0000 mg | ORAL_TABLET | Freq: Three times a day (TID) | ORAL | 0 refills | Status: DC

## 2017-05-29 MED ORDER — LOSARTAN POTASSIUM 100 MG PO TABS: 100.0000 mg | ORAL_TABLET | Freq: Every day | ORAL | 0 refills | Status: DC

## 2017-05-29 NOTE — Telephone Encounter (Signed)
Cancel 90 days supply that's left with [facility], will resend new rx to [name] mail order. Pt is aware.

## 2017-05-29 NOTE — Telephone Encounter (Signed)
90 day supply sent into [facility] mail order.

## 2017-06-19 ENCOUNTER — Telehealth: Payer: Self-pay | Admitting: Nurse Practitioner

## 2017-06-19 DIAGNOSIS — G63 Polyneuropathy in diseases classified elsewhere: Secondary | ICD-10-CM

## 2017-06-19 DIAGNOSIS — G2581 Restless legs syndrome: Secondary | ICD-10-CM

## 2017-06-19 MED ORDER — GABAPENTIN ENACARBIL ER 600 MG PO TBCR: 1.0000 | EXTENDED_RELEASE_TABLET | Freq: Every evening | ORAL | 0 refills | Status: DC

## 2017-06-19 NOTE — Addendum Note (Signed)
Addended by: Leana Gamer on: 06/19/2017 03:37 PM   Modules accepted: Orders

## 2017-06-19 NOTE — Telephone Encounter (Signed)
Pt verbalized understand. She would like to try new med and please send to [facility].

## 2017-06-19 NOTE — Telephone Encounter (Signed)
Please advise 

## 2017-06-19 NOTE — Telephone Encounter (Signed)
I will not recommended increasing the current dose of gabapentin. I will rather switch medication to extended release tablet. Let me know if she will like me to send medication.

## 2017-06-19 NOTE — Telephone Encounter (Signed)
Pt reports Gabapentin dosage was changed from 400mg - 4X day  to  600mg - 3X day. States taking current dose at breakfast, lunch and bedtime but is "too spaced out." Reports having breakthrough pain between each dose and is taking 1/2tab in between those doses. Note routed to practice.

## 2017-06-19 NOTE — Telephone Encounter (Signed)
Copied from [place] [id number]. Topic: General - Other >> Jun 19, 2017 11:10 AM Patrice Paradise wrote: Reason for CRM: Patient called because [name] has increased her gabapentin (NEURONTIN) 600 MG tablet 1 tab 3 times a day. Patient stated that the doses is spread so far apart that she something have to take a half tab before the next dose. Patient is requesting a call back to discuss, she can be reached at [phone].

## 2017-06-21 ENCOUNTER — Telehealth: Payer: Self-pay | Admitting: Nurse Practitioner

## 2017-06-21 MED ORDER — GABAPENTIN ENACARBIL ER 600 MG PO TBCR: 1.0000 | EXTENDED_RELEASE_TABLET | Freq: Every evening | ORAL | 0 refills | Status: DC

## 2017-06-21 NOTE — Telephone Encounter (Signed)
Attempted to call patient to schedule Annual Wellness Visit, but patient did not answer. Will try to call patient again at a later time. SF °

## 2017-06-21 NOTE — Telephone Encounter (Signed)
Called [facility] and cancel this rx we sent there and [facility].

## 2017-06-21 NOTE — Telephone Encounter (Signed)
Copied from [place] [phone]. Topic: General - Other >> Jun 19, 2017 11:10 AM Patrice Paradise wrote: Reason for CRM: Patient called because Baldo Ash has increased her gabapentin (NEURONTIN) 600 MG tablet 1 tab 3 times a day. Patient stated that the doses is spread so far apart that she something have to take a half tab before the next dose. Patient is requesting a call back to discuss, she can be reached at [phone]. >> Jun 19, 2017  3:51 PM Boyd Kerbs wrote: Pt called [facility] and they do not carry the extended release.  [name] [facility]  [id number] [address] [place]  [place] [address]  Phone: [phone] Fax: [phone]

## 2017-06-24 NOTE — Telephone Encounter (Signed)
Patient is following up on the prior authorization request for the Gabapentin.

## 2017-06-24 NOTE — Telephone Encounter (Signed)
PA for Gabapentin Enacarbil 600 mg started, waiting for the result.   Pt is aware.   Doreene Nest (Key: [id number])

## 2017-06-24 NOTE — Telephone Encounter (Signed)
Returning to call to [place], per patient regarding gabapentin [phone]

## 2017-06-24 NOTE — Telephone Encounter (Signed)
Left vm for the pt to call back.   [facility], [facility] will [facility] over the request for PA (gave our new fax #).

## 2017-06-25 NOTE — Telephone Encounter (Signed)
[facility] denied PA for Huxizant ER 600 mg. Please advise.

## 2017-06-26 ENCOUNTER — Telehealth: Payer: Self-pay | Admitting: Nurse Practitioner

## 2017-06-26 DIAGNOSIS — G63 Polyneuropathy in diseases classified elsewhere: Secondary | ICD-10-CM

## 2017-06-26 DIAGNOSIS — G2581 Restless legs syndrome: Secondary | ICD-10-CM

## 2017-06-26 MED ORDER — GABAPENTIN 800 MG PO TABS: 800.0000 mg | ORAL_TABLET | Freq: Three times a day (TID) | ORAL | 5 refills | Status: DC

## 2017-06-26 MED ORDER — GABAPENTIN 800 MG PO TABS: 800.0000 mg | ORAL_TABLET | Freq: Three times a day (TID) | ORAL | 4 refills | Status: DC

## 2017-06-26 NOTE — Telephone Encounter (Signed)
Pt is aware.  

## 2017-06-26 NOTE — Addendum Note (Signed)
Addended by: Wilfred Lacy L on: 06/26/2017 10:05 AM   Modules accepted: Orders

## 2017-06-26 NOTE — Telephone Encounter (Signed)
Pt pick up 30 day supply 06/26/2017. New rx sent in to [facility] as pt requested.

## 2017-06-26 NOTE — Telephone Encounter (Signed)
Copied from [place]. Topic: Quick Communication - See Telephone Encounter >> Jun 26, 2017 12:11 PM Corie Chiquito, [place] wrote: CRM for notification. See Telephone encounter for: Patient called because she would like her Gabapentin sent into the [facility] as well. She stated that [facility] but due to the co pay she will be using the [facility] when it's time to have the medication refilled. Also stated could you please put on the request not to fill the medication until there are notified   06/26/17.

## 2017-06-28 ENCOUNTER — Telehealth: Payer: Self-pay | Admitting: Nurse Practitioner

## 2017-06-28 DIAGNOSIS — R519 Headache, unspecified: Secondary | ICD-10-CM

## 2017-06-28 DIAGNOSIS — R51 Headache: Principal | ICD-10-CM

## 2017-06-28 NOTE — Telephone Encounter (Signed)
Pt called to check status of Rx refill for imitrex, contact pt to advise

## 2017-06-28 NOTE — Telephone Encounter (Signed)
Imitrex refill.  Last OV on 04/30/17 and BP was 146/86. According to protocol can't fill unless BP is less than 140/90.

## 2017-06-28 NOTE — Telephone Encounter (Signed)
Copied from [place]. Topic: Quick Communication - See Telephone Encounter >> Jun 28, 2017 10:57 AM Bea Graff, NT wrote: CRM for notification. See Telephone encounter for: Pt needing a refill of SUMAtriptan ([facility]REX) [facility] [address] Has contacted pharmacy first.   06/28/17.

## 2017-07-01 ENCOUNTER — Telehealth: Payer: Self-pay | Admitting: Nurse Practitioner

## 2017-07-01 DIAGNOSIS — K2100 Gastro-esophageal reflux disease with esophagitis, without bleeding: Secondary | ICD-10-CM

## 2017-07-01 DIAGNOSIS — I10 Essential (primary) hypertension: Secondary | ICD-10-CM

## 2017-07-01 DIAGNOSIS — E785 Hyperlipidemia, unspecified: Secondary | ICD-10-CM

## 2017-07-01 DIAGNOSIS — F332 Major depressive disorder, recurrent severe without psychotic features: Secondary | ICD-10-CM

## 2017-07-01 MED ORDER — PANTOPRAZOLE SODIUM 40 MG PO TBEC: 40.0000 mg | DELAYED_RELEASE_TABLET | Freq: Every day | ORAL | 0 refills | Status: DC

## 2017-07-01 MED ORDER — METOPROLOL SUCCINATE ER 50 MG PO TB24: 50.0000 mg | ORAL_TABLET | Freq: Every day | ORAL | 0 refills | Status: DC

## 2017-07-01 MED ORDER — METOPROLOL SUCCINATE ER 100 MG PO TB24: 100.0000 mg | ORAL_TABLET | Freq: Every day | ORAL | 0 refills | Status: DC

## 2017-07-01 MED ORDER — SERTRALINE HCL 50 MG PO TABS: 50.0000 mg | ORAL_TABLET | Freq: Every day | ORAL | 0 refills | Status: DC

## 2017-07-01 MED ORDER — SUMATRIPTAN SUCCINATE 50 MG PO TABS: ORAL_TABLET | ORAL | 0 refills | Status: DC

## 2017-07-01 MED ORDER — ATORVASTATIN CALCIUM 40 MG PO TABS: 40.0000 mg | ORAL_TABLET | Freq: Every day | ORAL | 0 refills | Status: DC

## 2017-07-01 MED ORDER — LOSARTAN POTASSIUM 100 MG PO TABS: 100.0000 mg | ORAL_TABLET | Freq: Every day | ORAL | 0 refills | Status: DC

## 2017-07-01 MED ORDER — AMLODIPINE BESYLATE 10 MG PO TABS: 10.0000 mg | ORAL_TABLET | Freq: Every day | ORAL | 0 refills | Status: DC

## 2017-07-01 NOTE — Telephone Encounter (Signed)
Ok to refill 

## 2017-07-01 NOTE — Telephone Encounter (Signed)
Rx 90 day supply sent into [facility].   FYI, pt wants to let [name] know gabapentin 800 mg is working for her.

## 2017-07-01 NOTE — Telephone Encounter (Signed)
Patient calling, stating that her meds have been received at [facility] or [facility]. Requesting to speak with nurse.

## 2017-07-01 NOTE — Addendum Note (Signed)
Addended byShawnie Pons on: 07/01/2017 03:30 PM   Modules accepted: Orders

## 2017-07-01 NOTE — Telephone Encounter (Signed)
Copied from [place] [id number]. Topic: Quick Communication - Rx Refill/Question >> Jul 01, 2017 10:58 AM Oliver Pila B wrote: Medication: amLODipine (NORVASC) 10 MG tablet [[id number]], atorvastatin (LIPITOR) 40 MG tablet [[id number]] , losartan (COZAAR) 100 MG tablet [[id number]] , metoprolol succinate (TOPROL-XL) 100 MG 24 hr tablet [[id number]] ,  metoprolol succinate (TOPROL-XL) 50 MG 24 hr tablet [[id number]] , pantoprazole (PROTONIX) 40 MG tablet [[id number]] , sertraline (ZOLOFT) 50 MG tablet [[id number]]   [facility] pharmacy is needing the DO NOT FILL BY DATE adjusted so they can release the medications to the pt, contact [place] @ [phone] ext [phone], if message is left leave first and last name of the individual leaving the message

## 2017-07-01 NOTE — Telephone Encounter (Signed)
Left a vm for the pt to call back, need to inform of massage below.   [facility]. Others med already sent to [facility] with date to fill noted for them already. Pt need to make 6 mo appt with [facility] (last ov 04/2017)

## 2017-07-01 NOTE — Telephone Encounter (Signed)
rx already sent in to [facility] and have a date to fill noted on there.

## 2017-07-01 NOTE — Telephone Encounter (Signed)
Please advise, ok to send in?  

## 2017-07-08 NOTE — Telephone Encounter (Signed)
Noted...[facility]avis  °

## 2017-07-10 ENCOUNTER — Ambulatory Visit: Payer: Self-pay | Admitting: Behavioral Health

## 2017-07-17 ENCOUNTER — Ambulatory Visit: Payer: Self-pay | Admitting: Behavioral Health

## 2017-08-21 ENCOUNTER — Ambulatory Visit: Payer: Self-pay | Admitting: Behavioral Health

## 2017-08-26 ENCOUNTER — Other Ambulatory Visit: Payer: Self-pay | Admitting: Nurse Practitioner

## 2017-08-26 DIAGNOSIS — G8929 Other chronic pain: Secondary | ICD-10-CM

## 2017-08-26 DIAGNOSIS — R51 Headache: Principal | ICD-10-CM

## 2017-08-26 MED ORDER — SUMATRIPTAN SUCCINATE 50 MG PO TABS: ORAL_TABLET | ORAL | 0 refills | Status: DC

## 2017-08-26 NOTE — Telephone Encounter (Signed)
Rx sent 

## 2017-08-26 NOTE — Telephone Encounter (Signed)
Copied from [place] [id number]. Topic: Quick Communication - Rx Refill/Question >> Aug 26, 2017 11:15 AM Corie Chiquito, NT wrote: Medication: Sumatriptan 50 mg Has the patient contacted their pharmacy? Yes Patient calling because she needs a refill on the above medication. Stated that she has been in contact  with her pharmacy as well about the medication Preferred Pharmacy (with phone number [facility] street name[phone] Barbara Cower [phone] Agent: Please be advised that RX refills may take up to 3 business days. We ask that you follow-up with your pharmacy.

## 2017-09-02 NOTE — Progress Notes (Deleted)
Subjective:   [name] is a 77 y.o. female who presents for Medicare Annual (Subsequent) preventive examination.  Review of Systems: No ROS.  Medicare Wellness Visit. Additional risk factors are reflected in the social history.    Sleep patterns:  Home Safety/Smoke Alarms: Feels safe in home. Smoke alarms in place.  Living environment; residence and Firearm Safety: . [place] Safety/Bike Helmet: Wears seat belt.   Female:       Mammo- active order       Dexa scan-        CCS-  Objective:     Vitals: There were no vitals taken for this visit.  There is no height or weight on file to calculate BMI.  Advanced Directives 11/10/2016 07/08/2016 06/17/2016 04/30/2016 02/25/2016 02/22/2016 02/06/2016  Does Patient Have a Medical Advance Directive? No No No Yes;No Yes No Yes  Type of Advance Directive - - - - Event organiser;Living will  Does patient want to make changes to medical advance directive? - - - - - - -  Copy of [place] in Chart? - - - No - copy requested - - No - copy requested  Would patient like information on creating a medical advance directive? No - Patient declined No - Patient declined - - - - -  Pre-existing out of facility DNR order (yellow form or pink MOST form) - - - - - - -  Some encounter information is confidential and restricted. Go to Review Flowsheets activity to see all data.    Tobacco Social History   Tobacco Use  Smoking Status Never Smoker  Smokeless Tobacco Never Used     Counseling given: Not Answered   Clinical Intake:                       Past Medical History:  Diagnosis Date  . Anxiety   . Arthritis    djd  . Blood transfusion without reported diagnosis   . Cataracts, bilateral    immature   . Cellulitis    10/11/11 hospitalized for cellulitis   . Chicken pox   . Chronic back pain    scoliosis/stenosis  . Depression    takes Zoloft daily  .  Depression   . Dysrhythmia    hx tachy palpitations  . Elbow fracture, left April 2015  . GERD (gastroesophageal reflux disease)    hx pud '98  . Glaucoma   . Headache(784.0)    migraines yrs ago  . History of bronchitis    many yrs ago  . Hyperlipemia    takes Atorvastatin daily  . Hypertension    takes HCTZ daily  . Insomnia   . Joint pain   . Joint swelling   . Migraine   . Neuromuscular disorder ([place])    peripheral neuropathy, FEET AND LEGS  . Peripheral neuropathy   . Peripheral neuropathy    takes Gabapentin daily  . Positive TB test   . 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 Radiculopathy, lumbar region   . Restless leg syndrome    takes Sinemet daily  . Stroke [facility])    ? 6 months ago - tests okay - [facility]   . Vertigo    Past Surgical History:  Procedure Laterality Date  . ABDOMINAL HYSTERECTOMY  1975   bil oophorectomy  . APPENDECTOMY    . BACK SURGERY  MANY YRS AGO   laminectomy x3  . CARPAL TUNNEL RELEASE  07/09/2012   Procedure: CARPAL TUNNEL RELEASE;  Surgeon: Magnus Sinning, MD;  [facility]: [facility];  Service: Orthopedics;  Laterality: Left;  . cervical disc surgery x2  2011  . CHOLECYSTECTOMY  1993  . colonosocpy    . ESOPHAGOGASTRODUODENOSCOPY    . FINGER ARTHROPLASTY  07/09/2012   Procedure: FINGER ARTHROPLASTY;  Surgeon: Magnus Sinning, MD;  [facility];  Service: Orthopedics;  Laterality: Left;  Interposition Arthroplasty CMC Joint Thumb Left   . HEMATOMA EVACUATION  2011   s/p rt cea  . JOINT REPLACEMENT  '01   total knee replacement, LEFT  . left knee arthroscopy  2001  . LIPOMA EXCISION  07/09/2012   Procedure: EXCISION LIPOMA;  Surgeon: Magnus Sinning, MD;  [facility];  Service: Orthopedics;  Laterality: Left;  Excision Lipoma Dorsum Left Wrist   . LUMBAR LAMINECTOMY WITH COFLEX 1 LEVEL Bilateral 04/19/2015   Procedure: LUMBAR TWO-THREE LUMBAR LAMINECTOMY WITH COFLEX ;  Surgeon: Kristeen Miss, MD;  Location: [place] [facility];  Service: Neurosurgery;  Laterality: Bilateral;  Bilateral L23 laminectomy and foraminotomy with coflex  . LUMBAR LAMINECTOMY/DECOMPRESSION MICRODISCECTOMY Right 12/19/2015   Procedure: Right Lumbar two-three  Microdiskectomy;  Surgeon: Kristeen Miss, MD;  Location: [place] [facility];  Service: Neurosurgery;  Laterality: Right;  . ORIF ANKLE FRACTURE Right 09/14/2014   FIBULA   . ORIF ANKLE FRACTURE Right 09/14/2014   Procedure: OPEN REDUCTION INTERNAL FIXATION (ORIF) RIGHT ANKLE FRACTURE/SYNDESMOSIS ;  Surgeon: Wylene Simmer, MD;  Location: [place];  Service: Orthopedics;  Laterality: Right;  . right knee replacement     09/2011   . rt carotid enarterectomy  2011  . rt knee arthroscopy  '99  . SHOULDER ARTHROSCOPY Right   . TOTAL KNEE ARTHROPLASTY  09/13/2011   Procedure: TOTAL KNEE ARTHROPLASTY;  Surgeon: Magnus Sinning, MD;  [facility];  Service: Orthopedics;  Laterality: Right;   Family History  Problem Relation Age of Onset  . Congestive Heart Failure Father   . Arthritis Father   . Hearing loss Father   . Heart disease Father   . Congestive Heart Failure Sister   . Arthritis Sister   . COPD Sister   . Depression Sister   . Early death Sister   . Hypertension Sister   . Alzheimer's disease Mother   . Arthritis Mother   . Diabetes Brother   . Cancer Brother        adrenal gland  . Testicular cancer Brother   . Arthritis Brother   . Heart disease Brother   . Hyperlipidemia Brother   . Hypertension Brother   . Arthritis Brother   . Cancer Brother        prostate  . Diabetes Brother   . Prostate cancer Brother   . Hypertension Brother   . Early death Maternal Grandmother   . Early death Maternal Grandfather   . Early death Paternal Grandmother   . Early death Paternal Grandfather    Social History   Socioeconomic History  . Marital status: Widowed    Spouse name: Not on file  . Number of children: 2  . Years of education: 41  . Highest education level: Not on  file  Occupational History  . Not on file  Social Needs  . Financial resource strain: Not on file  . Food insecurity:    Worry: Not on file    Inability: Not  on file  . Transportation needs:    Medical: Not on file    Non-medical: Not on file  Tobacco Use  . Smoking status: Never Smoker  . Smokeless tobacco: Never Used  Substance and Sexual Activity  . Alcohol use: No    Alcohol/week: 0.0 oz  . Drug use: No  . Sexual activity: Never    Birth control/protection: Surgical  Lifestyle  . Physical activity:    Days per week: Not on file    Minutes per session: Not on file  . Stress: Not on file  Relationships  . Social connections:    Talks on phone: Not on file    Gets together: Not on file    Attends religious service: Not on file    Active member of club or organization: Not on file    Attends meetings of clubs or organizations: Not on file    Relationship status: Not on file  Other Topics Concern  . Not on file  Social History Narrative   Patient is widowed and lives alone.   Patient has two sons.   Patient is retired.   Patient has a college education.   Patient is right-handed.   Patient drinks three glasses of Coke-soda daily.       Outpatient Encounter Medications as of 09/04/2017  Medication Sig  . acetaminophen (TYLENOL) 500 MG tablet Take 500 mg by mouth every 8 (eight) hours as needed for moderate pain. Not to exceed 3000mg  per day total (6 pills)  . amLODipine (NORVASC) 10 MG tablet Take 1 tablet (10 mg total) by mouth daily. For blood pressure  . aspirin EC 325 MG tablet Take 1 tablet (325 mg total) by mouth daily.  Marland Kitchen atorvastatin (LIPITOR) 40 MG tablet Take 1 tablet (40 mg total) by mouth daily at 6 PM.  . cholecalciferol (VITAMIN D) 1000 units tablet Take 1,000 Units by mouth daily.  Marland Kitchen gabapentin (NEURONTIN) 800 MG tablet Take 1 tablet (800 mg total) by mouth 3 (three) times daily. Fill on or after 07/27/2017.  Marland Kitchen Liniments (SALONPAS EX) Apply topically as  needed.  Marland Kitchen losartan (COZAAR) 100 MG tablet Take 1 tablet (100 mg total) by mouth daily.  . metoprolol succinate (TOPROL-XL) 100 MG 24 hr tablet Take 1 tablet (100 mg total) by mouth daily. Take with 50mg  tablet  . metoprolol succinate (TOPROL-XL) 50 MG 24 hr tablet Take 1 tablet (50 mg total) by mouth daily. Take with 100mg  dose once daily  . pantoprazole (PROTONIX) 40 MG tablet Take 1 tablet (40 mg total) by mouth daily.  Marland Kitchen pyridOXINE (VITAMIN B-6) 50 MG tablet Take 50 mg by mouth daily.  . sertraline (ZOLOFT) 50 MG tablet Take 1 tablet (50 mg total) by mouth daily.  . sodium chloride (OCEAN) 0.65 % SOLN nasal spray Place 1 spray into both nostrils as needed for congestion.  . SUMAtriptan (IMITREX) 50 MG tablet TAKE 1 TABLET (50 MG TOTAL) BY MOUTH AS NEEDED FOR MIGRAINE OR HEADACHE  . [DISCONTINUED] potassium chloride 20 MEQ TBCR Take 20 mEq by mouth daily.   No facility-administered encounter medications on file as of 09/04/2017.     Activities of Daily Living No flowsheet data found.  Patient Care Team: Nche, Charlene Brooke, NP as PCP - General (Internal Medicine) Kathrynn Ducking, MD as Consulting Physician (Neurology) Kristeen Miss, MD as Consulting Physician (Neurosurgery) Newt Minion, MD as Consulting Physician (Orthopedic Surgery)    Assessment:   This is a routine wellness examination for  Mechele Claude. Physical assessment deferred to PCP.  Exercise Activities and Dietary recommendations   Diet (meal preparation, eat out, water intake, caffeinated beverages, dairy products, fruits and vegetables): {Desc; diets:16563} Breakfast: Lunch:  Dinner:      Goals    None      Fall Risk Fall Risk  07/20/2016 06/29/2016 04/30/2016 02/27/2016 01/23/2016  Falls in the past year? No No Yes Yes Yes  Number falls in past yr: - - 2 or more - 2 or more  Injury with Fall? - - Yes No No  Comment - - Hit her head on the floor; shoulder pain; Did not go to the ED. - -  Risk Factor Category  - -  High Fall Risk - -  Risk for fall due to : - - History of fall(s);Impaired balance/gait - -  Follow up - - Falls prevention discussed - -    Depression Screen PHQ 2/9 Scores 07/20/2016 06/29/2016 04/30/2016 02/27/2016  PHQ - 2 Score 0 0 0 0  PHQ- 9 Score - - - -     Cognitive Function MMSE - Mini Mental State Exam 04/30/2016 01/06/2015  Orientation to time 5 5  Orientation to Place 5 5  Registration 3 3  Attention/ Calculation 5 5  Recall 2 3  Language- name 2 objects 2 2  Language- repeat 1 1  Language- follow 3 step command 3 3  Language- read & follow direction 1 1  Write a sentence 1 1  Copy design 1 1  Total score 29 30        Immunization History  Administered Date(s) Administered  . Influenza, High Dose Seasonal PF 04/30/2017  . Influenza,inj,Quad PF,6+ Mos 01/23/2016  . Influenza-Unspecified 02/26/2013, 03/12/2014, 03/05/2015  . Pneumococcal Conjugate-13 05/12/2014  . Pneumococcal Polysaccharide-23 02/26/2013, 06/20/2015    Screening Tests Health Maintenance  Topic Date Due  . TETANUS/TDAP  01/26/1961  . INFLUENZA VACCINE  01/02/2018  . COLONOSCOPY  01/30/2022  . DEXA SCAN  Completed  . PNA vac Low Risk Adult  Completed      Plan:   ***   I have personally reviewed and noted the following in the patient's chart:   . Medical and social history . Use of alcohol, tobacco or illicit drugs  . Current medications and supplements . Functional ability and status . Nutritional status . Physical activity . Advanced directives . List of other physicians . Hospitalizations, surgeries, and ER visits in previous 12 months . Vitals . Screenings to include cognitive, depression, and falls . Referrals and appointments  In addition, I have reviewed and discussed with patient certain preventive protocols, quality metrics, and best practice recommendations. A written personalized care plan for preventive services as well as general preventive health recommendations  were provided to patient.     Shela Nevin, [place]  09/02/2017

## 2017-09-04 ENCOUNTER — Ambulatory Visit: Payer: Self-pay | Admitting: Behavioral Health

## 2017-09-06 NOTE — Progress Notes (Deleted)
Subjective:   [name] is a 77 y.o. female who presents for Medicare Annual (Subsequent) preventive examination.  Review of Systems: No ROS.  Medicare Wellness Visit. Additional risk factors are reflected in the social history.    Sleep patterns: Home Safety/Smoke Alarms: Feels safe in home. Smoke alarms in place.  Living environment; residence and Firearm Safety:  [place] Safety/Bike Helmet: Wears seat belt.   Female:   Mammo- active order      Dexa scan-        CCS-     Objective:     Vitals: There were no vitals taken for this visit.  There is no height or weight on file to calculate BMI.  Advanced Directives 11/10/2016 07/08/2016 06/17/2016 04/30/2016 02/25/2016 02/22/2016 02/06/2016  Does Patient Have a Medical Advance Directive? No No No Yes;No Yes No Yes  Type of Advance Directive - - - - Event organiser;Living will  Does patient want to make changes to medical advance directive? - - - - - - -  Copy of [place] in Chart? - - - No - copy requested - - No - copy requested  Would patient like information on creating a medical advance directive? No - Patient declined No - Patient declined - - - - -  Pre-existing out of facility DNR order (yellow form or pink MOST form) - - - - - - -  Some encounter information is confidential and restricted. Go to Review Flowsheets activity to see all data.    Tobacco Social History   Tobacco Use  Smoking Status Never Smoker  Smokeless Tobacco Never Used     Counseling given: Not Answered   Clinical Intake:                       Past Medical History:  Diagnosis Date  . Anxiety   . Arthritis    djd  . Blood transfusion without reported diagnosis   . Cataracts, bilateral    immature   . Cellulitis    10/11/11 hospitalized for cellulitis   . Chicken pox   . Chronic back pain    scoliosis/stenosis  . Depression    takes Zoloft daily  . Depression    . Dysrhythmia    hx tachy palpitations  . Elbow fracture, left April 2015  . GERD (gastroesophageal reflux disease)    hx pud '98  . Glaucoma   . Headache(784.0)    migraines yrs ago  . History of bronchitis    many yrs ago  . Hyperlipemia    takes Atorvastatin daily  . Hypertension    takes HCTZ daily  . Insomnia   . Joint pain   . Joint swelling   . Migraine   . Neuromuscular disorder ([place])    peripheral neuropathy, FEET AND LEGS  . Peripheral neuropathy   . Peripheral neuropathy    takes Gabapentin daily  . Positive TB test   . 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 Radiculopathy, lumbar region   . Restless leg syndrome    takes Sinemet daily  . Stroke [facility])    ? 6 months ago - tests okay - [facility]   . Vertigo    Past Surgical History:  Procedure Laterality Date  . ABDOMINAL HYSTERECTOMY  1975   bil oophorectomy  . APPENDECTOMY    . BACK SURGERY   MANY  YRS AGO   laminectomy x3  . CARPAL TUNNEL RELEASE  07/09/2012   Procedure: CARPAL TUNNEL RELEASE;  Surgeon: Magnus Sinning, MD;  [facility];  Service: Orthopedics;  Laterality: Left;  . cervical disc surgery x2  2011  . CHOLECYSTECTOMY  1993  . colonosocpy    . ESOPHAGOGASTRODUODENOSCOPY    . FINGER ARTHROPLASTY  07/09/2012   Procedure: FINGER ARTHROPLASTY;  Surgeon: Magnus Sinning, MD;  [facility];  Service: Orthopedics;  Laterality: Left;  Interposition Arthroplasty CMC Joint Thumb Left   . HEMATOMA EVACUATION  2011   s/p rt cea  . JOINT REPLACEMENT  '01   total knee replacement, LEFT  . left knee arthroscopy  2001  . LIPOMA EXCISION  07/09/2012   Procedure: EXCISION LIPOMA;  Surgeon: Magnus Sinning, MD;  [facility];  Service: Orthopedics;  Laterality: Left;  Excision Lipoma Dorsum Left Wrist   . LUMBAR LAMINECTOMY WITH COFLEX 1 LEVEL Bilateral 04/19/2015   Procedure: LUMBAR TWO-THREE LUMBAR LAMINECTOMY WITH COFLEX ;  Surgeon: Kristeen Miss, MD;  Location: [place] [facility];   Service: Neurosurgery;  Laterality: Bilateral;  Bilateral L23 laminectomy and foraminotomy with coflex  . LUMBAR LAMINECTOMY/DECOMPRESSION MICRODISCECTOMY Right 12/19/2015   Procedure: Right Lumbar two-three  Microdiskectomy;  Surgeon: Kristeen Miss, MD;  Location: [place] [facility];  Service: Neurosurgery;  Laterality: Right;  . ORIF ANKLE FRACTURE Right 09/14/2014   FIBULA   . ORIF ANKLE FRACTURE Right 09/14/2014   Procedure: OPEN REDUCTION INTERNAL FIXATION (ORIF) RIGHT ANKLE FRACTURE/SYNDESMOSIS ;  Surgeon: Wylene Simmer, MD;  Location: [facility];  Service: Orthopedics;  Laterality: Right;  . right knee replacement     09/2011   . rt carotid enarterectomy  2011  . rt knee arthroscopy  '99  . SHOULDER ARTHROSCOPY Right   . TOTAL KNEE ARTHROPLASTY  09/13/2011   Procedure: TOTAL KNEE ARTHROPLASTY;  Surgeon: Magnus Sinning, MD;  [facility];  Service: Orthopedics;  Laterality: Right;   Family History  Problem Relation Age of Onset  . Congestive Heart Failure Father   . Arthritis Father   . Hearing loss Father   . Heart disease Father   . Congestive Heart Failure Sister   . Arthritis Sister   . COPD Sister   . Depression Sister   . Early death Sister   . Hypertension Sister   . Alzheimer's disease Mother   . Arthritis Mother   . Diabetes Brother   . Cancer Brother        adrenal gland  . Testicular cancer Brother   . Arthritis Brother   . Heart disease Brother   . Hyperlipidemia Brother   . Hypertension Brother   . Arthritis Brother   . Cancer Brother        prostate  . Diabetes Brother   . Prostate cancer Brother   . Hypertension Brother   . Early death Maternal Grandmother   . Early death Maternal Grandfather   . Early death Paternal Grandmother   . Early death Paternal Grandfather    Social History   Socioeconomic History  . Marital status: Widowed    Spouse name: Not on file  . Number of children: 2  . Years of education: 9  . Highest education level: Not on file    Occupational History  . Not on file  Social Needs  . Financial resource strain: Not on file  . Food insecurity:    Worry: Not on file    Inability: Not  on file  . Transportation needs:    Medical: Not on file    Non-medical: Not on file  Tobacco Use  . Smoking status: Never Smoker  . Smokeless tobacco: Never Used  Substance and Sexual Activity  . Alcohol use: No    Alcohol/week: 0.0 oz  . Drug use: No  . Sexual activity: Never    Birth control/protection: Surgical  Lifestyle  . Physical activity:    Days per week: Not on file    Minutes per session: Not on file  . Stress: Not on file  Relationships  . Social connections:    Talks on phone: Not on file    Gets together: Not on file    Attends religious service: Not on file    Active member of club or organization: Not on file    Attends meetings of clubs or organizations: Not on file    Relationship status: Not on file  Other Topics Concern  . Not on file  Social History Narrative   Patient is widowed and lives alone.   Patient has two sons.   Patient is retired.   Patient has a college education.   Patient is right-handed.   Patient drinks three glasses of Coke-soda daily.       Outpatient Encounter Medications as of 09/11/2017  Medication Sig  . acetaminophen (TYLENOL) 500 MG tablet Take 500 mg by mouth every 8 (eight) hours as needed for moderate pain. Not to exceed 3000mg  per day total (6 pills)  . amLODipine (NORVASC) 10 MG tablet Take 1 tablet (10 mg total) by mouth daily. For blood pressure  . aspirin EC 325 MG tablet Take 1 tablet (325 mg total) by mouth daily.  Marland Kitchen atorvastatin (LIPITOR) 40 MG tablet Take 1 tablet (40 mg total) by mouth daily at 6 PM.  . cholecalciferol (VITAMIN D) 1000 units tablet Take 1,000 Units by mouth daily.  Marland Kitchen gabapentin (NEURONTIN) 800 MG tablet Take 1 tablet (800 mg total) by mouth 3 (three) times daily. Fill on or after 07/27/2017.  Marland Kitchen Liniments (SALONPAS EX) Apply topically as  needed.  Marland Kitchen losartan (COZAAR) 100 MG tablet Take 1 tablet (100 mg total) by mouth daily.  . metoprolol succinate (TOPROL-XL) 100 MG 24 hr tablet Take 1 tablet (100 mg total) by mouth daily. Take with 50mg  tablet  . metoprolol succinate (TOPROL-XL) 50 MG 24 hr tablet Take 1 tablet (50 mg total) by mouth daily. Take with 100mg  dose once daily  . pantoprazole (PROTONIX) 40 MG tablet Take 1 tablet (40 mg total) by mouth daily.  Marland Kitchen pyridOXINE (VITAMIN B-6) 50 MG tablet Take 50 mg by mouth daily.  . sertraline (ZOLOFT) 50 MG tablet Take 1 tablet (50 mg total) by mouth daily.  . sodium chloride (OCEAN) 0.65 % SOLN nasal spray Place 1 spray into both nostrils as needed for congestion.  . SUMAtriptan (IMITREX) 50 MG tablet TAKE 1 TABLET (50 MG TOTAL) BY MOUTH AS NEEDED FOR MIGRAINE OR HEADACHE  . [DISCONTINUED] potassium chloride 20 MEQ TBCR Take 20 mEq by mouth daily.   No facility-administered encounter medications on file as of 09/11/2017.     Activities of Daily Living No flowsheet data found.  Patient Care Team: Nche, Charlene Brooke, NP as PCP - General (Internal Medicine) Kathrynn Ducking, MD as Consulting Physician (Neurology) Kristeen Miss, MD as Consulting Physician (Neurosurgery) Newt Minion, MD as Consulting Physician (Orthopedic Surgery)    Assessment:   This is a routine wellness examination for  [name]. Physical assessment deferred to PCP.  Exercise Activities and Dietary recommendations   Diet (meal preparation, eat out, water intake, caffeinated beverages, dairy products, fruits and vegetables): {Desc; diets:16563} Breakfast: Lunch:  Dinner:      Goals    None      Fall Risk Fall Risk  07/20/2016 06/29/2016 04/30/2016 02/27/2016 01/23/2016  Falls in the past year? No No Yes Yes Yes  Number falls in past yr: - - 2 or more - 2 or more  Injury with Fall? - - Yes No No  Comment - - Hit her head on the floor; shoulder pain; Did not go to the ED. - -  Risk Factor Category  - -  High Fall Risk - -  Risk for fall due to : - - History of fall(s);Impaired balance/gait - -  Follow up - - Falls prevention discussed - -    Depression Screen PHQ 2/9 Scores 07/20/2016 06/29/2016 04/30/2016 02/27/2016  PHQ - 2 Score 0 0 0 0  PHQ- 9 Score - - - -     Cognitive Function MMSE - Mini Mental State Exam 04/30/2016 01/06/2015  Orientation to time 5 5  Orientation to Place 5 5  Registration 3 3  Attention/ Calculation 5 5  Recall 2 3  Language- name 2 objects 2 2  Language- repeat 1 1  Language- follow 3 step command 3 3  Language- read & follow direction 1 1  Write a sentence 1 1  Copy design 1 1  Total score 29 30        Immunization History  Administered Date(s) Administered  . Influenza, High Dose Seasonal PF 04/30/2017  . Influenza,inj,Quad PF,6+ Mos 01/23/2016  . Influenza-Unspecified 02/26/2013, 03/12/2014, 03/05/2015  . Pneumococcal Conjugate-13 05/12/2014  . Pneumococcal Polysaccharide-23 02/26/2013, 06/20/2015   Screening Tests Health Maintenance  Topic Date Due  . TETANUS/TDAP  01[birth year]26/1960  . INFLUENZA VACCINE  01/02/2018  . COLONOSCOPY  01/30/2022  . DEXA SCAN  Completed  . PNA vac Low Risk Adult  Completed      Plan:   ***   I have personally reviewed and noted the following in the patient's chart:   . Medical and social history . Use of alcohol, tobacco or illicit drugs  . Current medications and supplements . Functional ability and status . Nutritional status . Physical activity . Advanced directives . List of other physicians . Hospitalizations, surgeries, and ER visits in previous 12 months . Vitals . Screenings to include cognitive, depression, and falls . Referrals and appointments  In addition, I have reviewed and discussed with patient certain preventive protocols, quality metrics, and best practice recommendations. A written personalized care plan for preventive services as well as general preventive health recommendations were  provided to patient.     Shela Nevin, [place]  09/06/2017

## 2017-09-11 ENCOUNTER — Ambulatory Visit: Payer: Self-pay | Admitting: Behavioral Health

## 2017-10-02 ENCOUNTER — Ambulatory Visit: Payer: Self-pay | Admitting: Behavioral Health

## 2017-10-09 ENCOUNTER — Ambulatory Visit: Payer: Self-pay | Admitting: Behavioral Health

## 2017-10-14 NOTE — Progress Notes (Deleted)
Subjective:   [name] is a 77 y.o. female who presents for Medicare Annual (Subsequent) preventive examination.  Review of Systems: No ROS.  Medicare Wellness Visit. Additional risk factors are reflected in the social history.   Sleep patterns:  Home Safety/Smoke Alarms: Feels safe in home. Smoke alarms in place.  Living environment; residence and Firearm Safety:    Female:       Mammo- active order   Dexa scan-        CCS-     Objective:     Vitals: There were no vitals taken for this visit.  There is no height or weight on file to calculate BMI.  Advanced Directives 11/10/2016 07/08/2016 06/17/2016 04/30/2016 02/25/2016 02/22/2016 02/06/2016  Does Patient Have a Medical Advance Directive? No No No Yes;No Yes No Yes  Type of Advance Directive - - - - Event organiser;Living will  Does patient want to make changes to medical advance directive? - - - - - - -  Copy of [place] in Chart? - - - No - copy requested - - No - copy requested  Would patient like information on creating a medical advance directive? No - Patient declined No - Patient declined - - - - -  Pre-existing out of facility DNR order (yellow form or pink MOST form) - - - - - - -  Some encounter information is confidential and restricted. Go to Review Flowsheets activity to see all data.    Tobacco Social History   Tobacco Use  Smoking Status Never Smoker  Smokeless Tobacco Never Used     Counseling given: Not Answered   Clinical Intake:                       Past Medical History:  Diagnosis Date  . Anxiety   . Arthritis    djd  . Blood transfusion without reported diagnosis   . Cataracts, bilateral    immature   . Cellulitis    10/11/11 hospitalized for cellulitis   . Chicken pox   . Chronic back pain    scoliosis/stenosis  . Depression    takes Zoloft daily  . Depression   . Dysrhythmia    hx tachy palpitations  .  Elbow fracture, left April 2015  . GERD (gastroesophageal reflux disease)    hx pud '98  . Glaucoma   . Headache(784.0)    migraines yrs ago  . History of bronchitis    many yrs ago  . Hyperlipemia    takes Atorvastatin daily  . Hypertension    takes HCTZ daily  . Insomnia   . Joint pain   . Joint swelling   . Migraine   . Neuromuscular disorder ([place])    peripheral neuropathy, FEET AND LEGS  . Peripheral neuropathy   . Peripheral neuropathy    takes Gabapentin daily  . Positive TB test   . 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 Radiculopathy, lumbar region   . Restless leg syndrome    takes Sinemet daily  . Stroke [facility])    ? 6 months ago - tests okay - [facility]   . Vertigo    Past Surgical History:  Procedure Laterality Date  . ABDOMINAL HYSTERECTOMY  1975   bil oophorectomy  . APPENDECTOMY    . BACK SURGERY   MANY YRS AGO   laminectomy x3  .  CARPAL TUNNEL RELEASE  07/09/2012   Procedure: CARPAL TUNNEL RELEASE;  Surgeon: Magnus Sinning, MD;  [facility];  Service: Orthopedics;  Laterality: Left;  . cervical disc surgery x2  2011  . CHOLECYSTECTOMY  1993  . colonosocpy    . ESOPHAGOGASTRODUODENOSCOPY    . FINGER ARTHROPLASTY  07/09/2012   Procedure: FINGER ARTHROPLASTY;  Surgeon: Magnus Sinning, MD;  [facility];  Service: Orthopedics;  Laterality: Left;  Interposition Arthroplasty CMC Joint Thumb Left   . HEMATOMA EVACUATION  2011   s/p rt cea  . JOINT REPLACEMENT  '01   total knee replacement, LEFT  . left knee arthroscopy  2001  . LIPOMA EXCISION  07/09/2012   Procedure: EXCISION LIPOMA;  Surgeon: Magnus Sinning, MD;  [facility];  Service: Orthopedics;  Laterality: Left;  Excision Lipoma Dorsum Left Wrist   . LUMBAR LAMINECTOMY WITH COFLEX 1 LEVEL Bilateral 04/19/2015   Procedure: LUMBAR TWO-THREE LUMBAR LAMINECTOMY WITH COFLEX ;  Surgeon: Kristeen Miss, MD;  Location: [place] [facility];  Service: Neurosurgery;  Laterality: Bilateral;   Bilateral L23 laminectomy and foraminotomy with coflex  . LUMBAR LAMINECTOMY/DECOMPRESSION MICRODISCECTOMY Right 12/19/2015   Procedure: Right Lumbar two-three  Microdiskectomy;  Surgeon: Kristeen Miss, MD;  Location: [place] [facility];  Service: Neurosurgery;  Laterality: Right;  . ORIF ANKLE FRACTURE Right 09/14/2014   FIBULA   . ORIF ANKLE FRACTURE Right 09/14/2014   Procedure: OPEN REDUCTION INTERNAL FIXATION (ORIF) RIGHT ANKLE FRACTURE/SYNDESMOSIS ;  Surgeon: Wylene Simmer, MD;  Location: [place];  Service: Orthopedics;  Laterality: Right;  . right knee replacement     09/2011   . rt carotid enarterectomy  2011  . rt knee arthroscopy  '99  . SHOULDER ARTHROSCOPY Right   . TOTAL KNEE ARTHROPLASTY  09/13/2011   Procedure: TOTAL KNEE ARTHROPLASTY;  Surgeon: Magnus Sinning, MD;  [facility];  Service: Orthopedics;  Laterality: Right;   Family History  Problem Relation Age of Onset  . Congestive Heart Failure Father   . Arthritis Father   . Hearing loss Father   . Heart disease Father   . Congestive Heart Failure Sister   . Arthritis Sister   . COPD Sister   . Depression Sister   . Early death Sister   . Hypertension Sister   . Alzheimer's disease Mother   . Arthritis Mother   . Diabetes Brother   . Cancer Brother        adrenal gland  . Testicular cancer Brother   . Arthritis Brother   . Heart disease Brother   . Hyperlipidemia Brother   . Hypertension Brother   . Arthritis Brother   . Cancer Brother        prostate  . Diabetes Brother   . Prostate cancer Brother   . Hypertension Brother   . Early death Maternal Grandmother   . Early death Maternal Grandfather   . Early death Paternal Grandmother   . Early death Paternal Grandfather    Social History   Socioeconomic History  . Marital status: Widowed    Spouse name: Not on file  . Number of children: 2  . Years of education: 66  . Highest education level: Not on file  Occupational History  . Not on file  Social  Needs  . Financial resource strain: Not on file  . Food insecurity:    Worry: Not on file    Inability: Not on file  . Transportation needs:  Medical: Not on file    Non-medical: Not on file  Tobacco Use  . Smoking status: Never Smoker  . Smokeless tobacco: Never Used  Substance and Sexual Activity  . Alcohol use: No    Alcohol/week: 0.0 oz  . Drug use: No  . Sexual activity: Never    Birth control/protection: Surgical  Lifestyle  . Physical activity:    Days per week: Not on file    Minutes per session: Not on file  . Stress: Not on file  Relationships  . Social connections:    Talks on phone: Not on file    Gets together: Not on file    Attends religious service: Not on file    Active member of club or organization: Not on file    Attends meetings of clubs or organizations: Not on file    Relationship status: Not on file  Other Topics Concern  . Not on file  Social History Narrative   Patient is widowed and lives alone.   Patient has two sons.   Patient is retired.   Patient has a college education.   Patient is right-handed.   Patient drinks three glasses of Coke-soda daily.       Outpatient Encounter Medications as of 10/16/2017  Medication Sig  . acetaminophen (TYLENOL) 500 MG tablet Take 500 mg by mouth every 8 (eight) hours as needed for moderate pain. Not to exceed 3000mg  per day total (6 pills)  . amLODipine (NORVASC) 10 MG tablet Take 1 tablet (10 mg total) by mouth daily. For blood pressure  . aspirin EC 325 MG tablet Take 1 tablet (325 mg total) by mouth daily.  Marland Kitchen atorvastatin (LIPITOR) 40 MG tablet Take 1 tablet (40 mg total) by mouth daily at 6 PM.  . cholecalciferol (VITAMIN D) 1000 units tablet Take 1,000 Units by mouth daily.  Marland Kitchen gabapentin (NEURONTIN) 800 MG tablet Take 1 tablet (800 mg total) by mouth 3 (three) times daily. Fill on or after 07/27/2017.  Marland Kitchen Liniments (SALONPAS EX) Apply topically as needed.  Marland Kitchen losartan (COZAAR) 100 MG tablet Take 1  tablet (100 mg total) by mouth daily.  . metoprolol succinate (TOPROL-XL) 100 MG 24 hr tablet Take 1 tablet (100 mg total) by mouth daily. Take with 50mg  tablet  . metoprolol succinate (TOPROL-XL) 50 MG 24 hr tablet Take 1 tablet (50 mg total) by mouth daily. Take with 100mg  dose once daily  . pantoprazole (PROTONIX) 40 MG tablet Take 1 tablet (40 mg total) by mouth daily.  Marland Kitchen pyridOXINE (VITAMIN B-6) 50 MG tablet Take 50 mg by mouth daily.  . sertraline (ZOLOFT) 50 MG tablet Take 1 tablet (50 mg total) by mouth daily.  . sodium chloride (OCEAN) 0.65 % SOLN nasal spray Place 1 spray into both nostrils as needed for congestion.  . SUMAtriptan (IMITREX) 50 MG tablet TAKE 1 TABLET (50 MG TOTAL) BY MOUTH AS NEEDED FOR MIGRAINE OR HEADACHE  . [DISCONTINUED] potassium chloride 20 MEQ TBCR Take 20 mEq by mouth daily.   No facility-administered encounter medications on file as of 10/16/2017.     Activities of Daily Living No flowsheet data found.  Patient Care Team: Nche, Charlene Brooke, NP as PCP - General (Internal Medicine) Kathrynn Ducking, MD as Consulting Physician (Neurology) Kristeen Miss, MD as Consulting Physician (Neurosurgery) Newt Minion, MD as Consulting Physician (Orthopedic Surgery)    Assessment:   This is a routine wellness examination for [place]. Physical assessment deferred to PCP.  Exercise Activities  and Dietary recommendations   Diet (meal preparation, eat out, water intake, caffeinated beverages, dairy products, fruits and vegetables): {Desc; diets:16563} Breakfast: Lunch:  Dinner:      Goals    None      Fall Risk Fall Risk  07/20/2016 06/29/2016 04/30/2016 02/27/2016 01/23/2016  Falls in the past year? No No Yes Yes Yes  Number falls in past yr: - - 2 or more - 2 or more  Injury with Fall? - - Yes No No  Comment - - Hit her head on the floor; shoulder pain; Did not go to the ED. - -  Risk Factor Category  - - High Fall Risk - -  Risk for fall due to : - -  History of fall(s);Impaired balance/gait - -  Follow up - - Falls prevention discussed - -    Depression Screen PHQ 2/9 Scores 07/20/2016 06/29/2016 04/30/2016 02/27/2016  PHQ - 2 Score 0 0 0 0  PHQ- 9 Score - - - -     Cognitive Function MMSE - Mini Mental State Exam 04/30/2016 01/06/2015  Orientation to time 5 5  Orientation to Place 5 5  Registration 3 3  Attention/ Calculation 5 5  Recall 2 3  Language- name 2 objects 2 2  Language- repeat 1 1  Language- follow 3 step command 3 3  Language- read & follow direction 1 1  Write a sentence 1 1  Copy design 1 1  Total score 29 30        Immunization History  Administered Date(s) Administered  . Influenza, High Dose Seasonal PF 04/30/2017  . Influenza,inj,Quad PF,6+ Mos 01/23/2016  . Influenza-Unspecified 02/26/2013, 03/12/2014, 03/05/2015  . Pneumococcal Conjugate-13 05/12/2014  . Pneumococcal Polysaccharide-23 02/26/2013, 06/20/2015   Screening Tests Health Maintenance  Topic Date Due  . TETANUS/TDAP  01/26/1961  . INFLUENZA VACCINE  01/02/2018  . COLONOSCOPY  01/30/2022  . DEXA SCAN  Completed  . PNA vac Low Risk Adult  Completed      Plan:   ***   I have personally reviewed and noted the following in the patient's chart:   . Medical and social history . Use of alcohol, tobacco or illicit drugs  . Current medications and supplements . Functional ability and status . Nutritional status . Physical activity . Advanced directives . List of other physicians . Hospitalizations, surgeries, and ER visits in previous 12 months . Vitals . Screenings to include cognitive, depression, and falls . Referrals and appointments  In addition, I have reviewed and discussed with patient certain preventive protocols, quality metrics, and best practice recommendations. A written personalized care plan for preventive services as well as general preventive health recommendations were provided to patient.     Shela Nevin, [place]  10/14/2017

## 2017-10-15 ENCOUNTER — Other Ambulatory Visit: Payer: Self-pay | Admitting: Nurse Practitioner

## 2017-10-15 DIAGNOSIS — R519 Headache, unspecified: Secondary | ICD-10-CM

## 2017-10-15 DIAGNOSIS — R51 Headache: Principal | ICD-10-CM

## 2017-10-15 NOTE — Telephone Encounter (Signed)
Copied from [place] [id number]. Topic: Quick Communication - Rx Refill/Question >> Oct 15, 2017  6:17 PM Waylan Rocher, [place] L wrote: Medication: SUMAtriptan (IMITREX) 50 MG tablet Has the patient contacted their pharmacy? Yes.   (Agent: If no, request that the patient contact the pharmacy for the refill.) Preferred Pharmacy (with phone number or street name): [id number] [address] [place] [address] Phone: [phone] Fax: [phone] Agent: Please be advised that RX refills may take up to 3 business days. We ask that you follow-up with your pharmacy.  Pharmacy says we need to include our fax number on the refill so they can update it in their system.

## 2017-10-16 ENCOUNTER — Ambulatory Visit: Payer: Self-pay | Admitting: Behavioral Health

## 2017-10-16 MED ORDER — SUMATRIPTAN SUCCINATE 50 MG PO TABS: ORAL_TABLET | ORAL | 0 refills | Status: DC

## 2017-10-17 NOTE — Progress Notes (Deleted)
Subjective:   [name] is a 77 y.o. female who presents for Medicare Annual (Subsequent) preventive examination.  Review of Systems: No ROS.  Medicare Wellness Visit. Additional risk factors are reflected in the social history.   Sleep patterns: Home Safety/Smoke Alarms: Feels safe in home. Smoke alarms in place.  Living environment; residence and Firearm Safety:    Female:       Mammo- active order       Dexa scan-        CCS-      Objective:     Vitals: There were no vitals taken for this visit.  There is no height or weight on file to calculate BMI.  Advanced Directives 11/10/2016 07/08/2016 06/17/2016 04/30/2016 02/25/2016 02/22/2016 02/06/2016  Does Patient Have a Medical Advance Directive? No No No Yes;No Yes No Yes  Type of Advance Directive - - - - Event organiser;Living will  Does patient want to make changes to medical advance directive? - - - - - - -  Copy of [place] in Chart? - - - No - copy requested - - No - copy requested  Would patient like information on creating a medical advance directive? No - Patient declined No - Patient declined - - - - -  Pre-existing out of facility DNR order (yellow form or pink MOST form) - - - - - - -  Some encounter information is confidential and restricted. Go to Review Flowsheets activity to see all data.    Tobacco Social History   Tobacco Use  Smoking Status Never Smoker  Smokeless Tobacco Never Used     Counseling given: Not Answered   Clinical Intake:                       Past Medical History:  Diagnosis Date  . Anxiety   . Arthritis    djd  . Blood transfusion without reported diagnosis   . Cataracts, bilateral    immature   . Cellulitis    10/11/11 hospitalized for cellulitis   . Chicken pox   . Chronic back pain    scoliosis/stenosis  . Depression    takes Zoloft daily  . Depression   . Dysrhythmia    hx tachy palpitations   . Elbow fracture, left April 2015  . GERD (gastroesophageal reflux disease)    hx pud '98  . Glaucoma   . Headache(784.0)    migraines yrs ago  . History of bronchitis    many yrs ago  . Hyperlipemia    takes Atorvastatin daily  . Hypertension    takes HCTZ daily  . Insomnia   . Joint pain   . Joint swelling   . Migraine   . Neuromuscular disorder ([place])    peripheral neuropathy, FEET AND LEGS  . Peripheral neuropathy   . Peripheral neuropathy    takes Gabapentin daily  . Positive TB test   . 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 Radiculopathy, lumbar region   . Restless leg syndrome    takes Sinemet daily  . Stroke [facility])    ? 6 months ago - tests okay - [facility]   . Vertigo    Past Surgical History:  Procedure Laterality Date  . ABDOMINAL HYSTERECTOMY  1975   bil oophorectomy  . APPENDECTOMY    . BACK SURGERY   MANY YRS AGO  laminectomy x3  . CARPAL TUNNEL RELEASE  07/09/2012   Procedure: CARPAL TUNNEL RELEASE;  Surgeon: Magnus Sinning, MD;  [facility];  Service: Orthopedics;  Laterality: Left;  . cervical disc surgery x2  2011  . CHOLECYSTECTOMY  1993  . colonosocpy    . ESOPHAGOGASTRODUODENOSCOPY    . FINGER ARTHROPLASTY  07/09/2012   Procedure: FINGER ARTHROPLASTY;  Surgeon: Magnus Sinning, MD;  [facility];  Service: Orthopedics;  Laterality: Left;  Interposition Arthroplasty CMC Joint Thumb Left   . HEMATOMA EVACUATION  2011   s/p rt cea  . JOINT REPLACEMENT  '01   total knee replacement, LEFT  . left knee arthroscopy  2001  . LIPOMA EXCISION  07/09/2012   Procedure: EXCISION LIPOMA;  Surgeon: Magnus Sinning, MD;  [facility];  Service: Orthopedics;  Laterality: Left;  Excision Lipoma Dorsum Left Wrist   . LUMBAR LAMINECTOMY WITH COFLEX 1 LEVEL Bilateral 04/19/2015   Procedure: LUMBAR TWO-THREE LUMBAR LAMINECTOMY WITH COFLEX ;  Surgeon: Kristeen Miss, MD;  Location: [place] [facility];  Service: Neurosurgery;  Laterality: Bilateral;   Bilateral L23 laminectomy and foraminotomy with coflex  . LUMBAR LAMINECTOMY/DECOMPRESSION MICRODISCECTOMY Right 12/19/2015   Procedure: Right Lumbar two-three  Microdiskectomy;  Surgeon: Kristeen Miss, MD;  Location: [place] [facility];  Service: Neurosurgery;  Laterality: Right;  . ORIF ANKLE FRACTURE Right 09/14/2014   FIBULA   . ORIF ANKLE FRACTURE Right 09/14/2014   Procedure: OPEN REDUCTION INTERNAL FIXATION (ORIF) RIGHT ANKLE FRACTURE/SYNDESMOSIS ;  Surgeon: Wylene Simmer, MD;  Location: [place];  Service: Orthopedics;  Laterality: Right;  . right knee replacement     09/2011   . rt carotid enarterectomy  2011  . rt knee arthroscopy  '99  . SHOULDER ARTHROSCOPY Right   . TOTAL KNEE ARTHROPLASTY  09/13/2011   Procedure: TOTAL KNEE ARTHROPLASTY;  Surgeon: Magnus Sinning, MD;  [facility];  Service: Orthopedics;  Laterality: Right;   Family History  Problem Relation Age of Onset  . Congestive Heart Failure Father   . Arthritis Father   . Hearing loss Father   . Heart disease Father   . Congestive Heart Failure Sister   . Arthritis Sister   . COPD Sister   . Depression Sister   . Early death Sister   . Hypertension Sister   . Alzheimer's disease Mother   . Arthritis Mother   . Diabetes Brother   . Cancer Brother        adrenal gland  . Testicular cancer Brother   . Arthritis Brother   . Heart disease Brother   . Hyperlipidemia Brother   . Hypertension Brother   . Arthritis Brother   . Cancer Brother        prostate  . Diabetes Brother   . Prostate cancer Brother   . Hypertension Brother   . Early death Maternal Grandmother   . Early death Maternal Grandfather   . Early death Paternal Grandmother   . Early death Paternal Grandfather    Social History   Socioeconomic History  . Marital status: Widowed    Spouse name: Not on file  . Number of children: 2  . Years of education: 55  . Highest education level: Not on file  Occupational History  . Not on file  Social  Needs  . Financial resource strain: Not on file  . Food insecurity:    Worry: Not on file    Inability: Not on file  . Transportation  needs:    Medical: Not on file    Non-medical: Not on file  Tobacco Use  . Smoking status: Never Smoker  . Smokeless tobacco: Never Used  Substance and Sexual Activity  . Alcohol use: No    Alcohol/week: 0.0 oz  . Drug use: No  . Sexual activity: Never    Birth control/protection: Surgical  Lifestyle  . Physical activity:    Days per week: Not on file    Minutes per session: Not on file  . Stress: Not on file  Relationships  . Social connections:    Talks on phone: Not on file    Gets together: Not on file    Attends religious service: Not on file    Active member of club or organization: Not on file    Attends meetings of clubs or organizations: Not on file    Relationship status: Not on file  Other Topics Concern  . Not on file  Social History Narrative   Patient is widowed and lives alone.   Patient has two sons.   Patient is retired.   Patient has a college education.   Patient is right-handed.   Patient drinks three glasses of Coke-soda daily.       Outpatient Encounter Medications as of 10/23/2017  Medication Sig  . acetaminophen (TYLENOL) 500 MG tablet Take 500 mg by mouth every 8 (eight) hours as needed for moderate pain. Not to exceed 3000mg  per day total (6 pills)  . amLODipine (NORVASC) 10 MG tablet Take 1 tablet (10 mg total) by mouth daily. For blood pressure  . aspirin EC 325 MG tablet Take 1 tablet (325 mg total) by mouth daily.  Marland Kitchen atorvastatin (LIPITOR) 40 MG tablet Take 1 tablet (40 mg total) by mouth daily at 6 PM.  . cholecalciferol (VITAMIN D) 1000 units tablet Take 1,000 Units by mouth daily.  Marland Kitchen gabapentin (NEURONTIN) 800 MG tablet Take 1 tablet (800 mg total) by mouth 3 (three) times daily. Fill on or after 07/27/2017.  Marland Kitchen Liniments (SALONPAS EX) Apply topically as needed.  Marland Kitchen losartan (COZAAR) 100 MG tablet Take 1  tablet (100 mg total) by mouth daily.  . metoprolol succinate (TOPROL-XL) 100 MG 24 hr tablet Take 1 tablet (100 mg total) by mouth daily. Take with 50mg  tablet  . metoprolol succinate (TOPROL-XL) 50 MG 24 hr tablet Take 1 tablet (50 mg total) by mouth daily. Take with 100mg  dose once daily  . pantoprazole (PROTONIX) 40 MG tablet Take 1 tablet (40 mg total) by mouth daily.  Marland Kitchen pyridOXINE (VITAMIN B-6) 50 MG tablet Take 50 mg by mouth daily.  . sertraline (ZOLOFT) 50 MG tablet Take 1 tablet (50 mg total) by mouth daily.  . sodium chloride (OCEAN) 0.65 % SOLN nasal spray Place 1 spray into both nostrils as needed for congestion.  . SUMAtriptan (IMITREX) 50 MG tablet TAKE 1 TABLET (50 MG TOTAL) BY MOUTH AS NEEDED FOR MIGRAINE OR HEADACHE  . [DISCONTINUED] potassium chloride 20 MEQ TBCR Take 20 mEq by mouth daily.   No facility-administered encounter medications on file as of 10/23/2017.     Activities of Daily Living No flowsheet data found.  Patient Care Team: Nche, Charlene Brooke, NP as PCP - General (Internal Medicine) Kathrynn Ducking, MD as Consulting Physician (Neurology) Kristeen Miss, MD as Consulting Physician (Neurosurgery) Newt Minion, MD as Consulting Physician (Orthopedic Surgery)    Assessment:   This is a routine wellness examination for [place]. Physical assessment deferred to  PCP.  Exercise Activities and Dietary recommendations   Diet (meal preparation, eat out, water intake, caffeinated beverages, dairy products, fruits and vegetables): {Desc; diets:16563} Breakfast: Lunch:  Dinner:      Goals    None      Fall Risk Fall Risk  07/20/2016 06/29/2016 04/30/2016 02/27/2016 01/23/2016  Falls in the past year? No No Yes Yes Yes  Number falls in past yr: - - 2 or more - 2 or more  Injury with Fall? - - Yes No No  Comment - - Hit her head on the floor; shoulder pain; Did not go to the ED. - -  Risk Factor Category  - - High Fall Risk - -  Risk for fall due to : - -  History of fall(s);Impaired balance/gait - -  Follow up - - Falls prevention discussed - -    Depression Screen PHQ 2/9 Scores 07/20/2016 06/29/2016 04/30/2016 02/27/2016  PHQ - 2 Score 0 0 0 0  PHQ- 9 Score - - - -     Cognitive Function MMSE - Mini Mental State Exam 04/30/2016 01/06/2015  Orientation to time 5 5  Orientation to Place 5 5  Registration 3 3  Attention/ Calculation 5 5  Recall 2 3  Language- name 2 objects 2 2  Language- repeat 1 1  Language- follow 3 step command 3 3  Language- read & follow direction 1 1  Write a sentence 1 1  Copy design 1 1  Total score 29 30        Immunization History  Administered Date(s) Administered  . Influenza, High Dose Seasonal PF 04/30/2017  . Influenza,inj,Quad PF,6+ Mos 01/23/2016  . Influenza-Unspecified 02/26/2013, 03/12/2014, 03/05/2015  . Pneumococcal Conjugate-13 05/12/2014  . Pneumococcal Polysaccharide-23 02/26/2013, 06/20/2015    Screening Tests Health Maintenance  Topic Date Due  . TETANUS/TDAP  01/26/1960  . INFLUENZA VACCINE  01/02/2018  . COLONOSCOPY  01/30/2022  . DEXA SCAN  Completed  . PNA vac Low Risk Adult  Completed      Plan:   ***   I have personally reviewed and noted the following in the patient's chart:   . Medical and social history . Use of alcohol, tobacco or illicit drugs  . Current medications and supplements . Functional ability and status . Nutritional status . Physical activity . Advanced directives . List of other physicians . Hospitalizations, surgeries, and ER visits in previous 12 months . Vitals . Screenings to include cognitive, depression, and falls . Referrals and appointments  In addition, I have reviewed and discussed with patient certain preventive protocols, quality metrics, and best practice recommendations. A written personalized care plan for preventive services as well as general preventive health recommendations were provided to patient.     Shela Nevin, [place]  10/17/2017

## 2017-10-23 ENCOUNTER — Ambulatory Visit: Payer: Self-pay | Admitting: Behavioral Health

## 2017-10-23 NOTE — Progress Notes (Deleted)
Subjective:   [name] is a 77 y.o. female who presents for Medicare Annual (Subsequent) preventive examination.  Review of Systems: No ROS.  Medicare Wellness Visit. Additional risk factors are reflected in the social history.   Sleep patterns:  Home Safety/Smoke Alarms: Feels safe in home. Smoke alarms in place.  Living environment; residence and Firearm Safety:   Female:        Mammo-       Dexa scan-        CCS-     Objective:     Vitals: There were no vitals taken for this visit.  There is no height or weight on file to calculate BMI.  Advanced Directives 11/10/2016 07/08/2016 06/17/2016 04/30/2016 02/25/2016 02/22/2016 02/06/2016  Does Patient Have a Medical Advance Directive? No No No Yes;No Yes No Yes  Type of Advance Directive - - - - Event organiser;Living will  Does patient want to make changes to medical advance directive? - - - - - - -  Copy of [place] in Chart? - - - No - copy requested - - No - copy requested  Would patient like information on creating a medical advance directive? No - Patient declined No - Patient declined - - - - -  Pre-existing out of facility DNR order (yellow form or pink MOST form) - - - - - - -  Some encounter information is confidential and restricted. Go to Review Flowsheets activity to see all data.    Tobacco Social History   Tobacco Use  Smoking Status Never Smoker  Smokeless Tobacco Never Used     Counseling given: Not Answered   Clinical Intake:                       Past Medical History:  Diagnosis Date  . Anxiety   . Arthritis    djd  . Blood transfusion without reported diagnosis   . Cataracts, bilateral    immature   . Cellulitis    10/11/11 hospitalized for cellulitis   . Chicken pox   . Chronic back pain    scoliosis/stenosis  . Depression    takes Zoloft daily  . Depression   . Dysrhythmia    hx tachy palpitations  . Elbow  fracture, left April 2015  . GERD (gastroesophageal reflux disease)    hx pud '98  . Glaucoma   . Headache(784.0)    migraines yrs ago  . History of bronchitis    many yrs ago  . Hyperlipemia    takes Atorvastatin daily  . Hypertension    takes HCTZ daily  . Insomnia   . Joint pain   . Joint swelling   . Migraine   . Neuromuscular disorder ([place])    peripheral neuropathy, FEET AND LEGS  . Peripheral neuropathy   . Peripheral neuropathy    takes Gabapentin daily  . Positive TB test   . 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 Radiculopathy, lumbar region   . Restless leg syndrome    takes Sinemet daily  . Stroke [facility])    ? 6 months ago - tests okay - [facility]   . Vertigo    Past Surgical History:  Procedure Laterality Date  . ABDOMINAL HYSTERECTOMY  1975   bil oophorectomy  . APPENDECTOMY    . BACK SURGERY   MANY YRS AGO  laminectomy x3  . CARPAL TUNNEL RELEASE  07/09/2012   Procedure: CARPAL TUNNEL RELEASE;  Surgeon: Magnus Sinning, MD;  Location: [facility];  Service: Orthopedics;  Laterality: Left;  . cervical disc surgery x2  2011  . CHOLECYSTECTOMY  1993  . colonosocpy    . ESOPHAGOGASTRODUODENOSCOPY    . FINGER ARTHROPLASTY  07/09/2012   Procedure: FINGER ARTHROPLASTY;  Surgeon: Magnus Sinning, MD;  Location: [facility];  Service: Orthopedics;  Laterality: Left;  Interposition Arthroplasty [facility] Joint Thumb Left   . HEMATOMA EVACUATION  2011   s/p rt cea  . JOINT REPLACEMENT  '01   total knee replacement, LEFT  . left knee arthroscopy  2001  . LIPOMA EXCISION  07/09/2012   Procedure: EXCISION LIPOMA;  Surgeon: Magnus Sinning, MD;  Location: [facility];  Service: Orthopedics;  Laterality: Left;  Excision Lipoma Dorsum Left Wrist   . LUMBAR LAMINECTOMY WITH COFLEX 1 LEVEL Bilateral 04/19/2015   Procedure: LUMBAR TWO-THREE LUMBAR LAMINECTOMY WITH COFLEX ;  Surgeon: Kristeen Miss, MD;  Location: [place] [facility];  Service: Neurosurgery;  Laterality: Bilateral;  Bilateral  L23 laminectomy and foraminotomy with coflex  . LUMBAR LAMINECTOMY/DECOMPRESSION MICRODISCECTOMY Right 12/19/2015   Procedure: Right Lumbar two-three  Microdiskectomy;  Surgeon: Kristeen Miss, MD;  Location: [place] [facility];  Service: Neurosurgery;  Laterality: Right;  . ORIF ANKLE FRACTURE Right 09/14/2014   FIBULA   . ORIF ANKLE FRACTURE Right 09/14/2014   Procedure: OPEN REDUCTION INTERNAL FIXATION (ORIF) RIGHT ANKLE FRACTURE/SYNDESMOSIS ;  Surgeon: Wylene Simmer, MD;  Location: [place];  Service: Orthopedics;  Laterality: Right;  . right knee replacement     09/2011   . rt carotid enarterectomy  2011  . rt knee arthroscopy  '99  . SHOULDER ARTHROSCOPY Right   . TOTAL KNEE ARTHROPLASTY  09/13/2011   Procedure: TOTAL KNEE ARTHROPLASTY;  Surgeon: Magnus Sinning, MD;  Location: [facility];  Service: Orthopedics;  Laterality: Right;   Family History  Problem Relation Age of Onset  . Congestive Heart Failure Father   . Arthritis Father   . Hearing loss Father   . Heart disease Father   . Congestive Heart Failure Sister   . Arthritis Sister   . COPD Sister   . Depression Sister   . Early death Sister   . Hypertension Sister   . Alzheimer's disease Mother   . Arthritis Mother   . Diabetes Brother   . Cancer Brother        adrenal gland  . Testicular cancer Brother   . Arthritis Brother   . Heart disease Brother   . Hyperlipidemia Brother   . Hypertension Brother   . Arthritis Brother   . Cancer Brother        prostate  . Diabetes Brother   . Prostate cancer Brother   . Hypertension Brother   . Early death Maternal Grandmother   . Early death Maternal Grandfather   . Early death Paternal Grandmother   . Early death Paternal Grandfather    Social History   Socioeconomic History  . Marital status: Widowed    Spouse name: Not on file  . Number of children: 2  . Years of education: 69  . Highest education level: Not on file  Occupational History  . Not on file  Social Needs  .  Financial resource strain: Not on file  . Food insecurity:    Worry: Not on file    Inability: Not on file  . Transportation  needs:    Medical: Not on file    Non-medical: Not on file  Tobacco Use  . Smoking status: Never Smoker  . Smokeless tobacco: Never Used  Substance and Sexual Activity  . Alcohol use: No    Alcohol/week: 0.0 oz  . Drug use: No  . Sexual activity: Never    Birth control/protection: Surgical  Lifestyle  . Physical activity:    Days per week: Not on file    Minutes per session: Not on file  . Stress: Not on file  Relationships  . Social connections:    Talks on phone: Not on file    Gets together: Not on file    Attends religious service: Not on file    Active member of club or organization: Not on file    Attends meetings of clubs or organizations: Not on file    Relationship status: Not on file  Other Topics Concern  . Not on file  Social History Narrative   Patient is widowed and lives alone.   Patient has two sons.   Patient is retired.   Patient has a college education.   Patient is right-handed.   Patient drinks three glasses of Coke-soda daily.       Outpatient Encounter Medications as of 10/30/2017  Medication Sig  . acetaminophen (TYLENOL) 500 MG tablet Take 500 mg by mouth every 8 (eight) hours as needed for moderate pain. Not to exceed 3000mg  per day total (6 pills)  . amLODipine (NORVASC) 10 MG tablet Take 1 tablet (10 mg total) by mouth daily. For blood pressure  . aspirin EC 325 MG tablet Take 1 tablet (325 mg total) by mouth daily.  Marland Kitchen atorvastatin (LIPITOR) 40 MG tablet Take 1 tablet (40 mg total) by mouth daily at 6 PM.  . cholecalciferol (VITAMIN D) 1000 units tablet Take 1,000 Units by mouth daily.  Marland Kitchen gabapentin (NEURONTIN) 800 MG tablet Take 1 tablet (800 mg total) by mouth 3 (three) times daily. Fill on or after 07/27/2017.  Marland Kitchen Liniments (SALONPAS EX) Apply topically as needed.  Marland Kitchen losartan (COZAAR) 100 MG tablet Take 1 tablet (100  mg total) by mouth daily.  . metoprolol succinate (TOPROL-XL) 100 MG 24 hr tablet Take 1 tablet (100 mg total) by mouth daily. Take with 50mg  tablet  . metoprolol succinate (TOPROL-XL) 50 MG 24 hr tablet Take 1 tablet (50 mg total) by mouth daily. Take with 100mg  dose once daily  . pantoprazole (PROTONIX) 40 MG tablet Take 1 tablet (40 mg total) by mouth daily.  Marland Kitchen pyridOXINE (VITAMIN B-6) 50 MG tablet Take 50 mg by mouth daily.  . sertraline (ZOLOFT) 50 MG tablet Take 1 tablet (50 mg total) by mouth daily.  . sodium chloride (OCEAN) 0.65 % SOLN nasal spray Place 1 spray into both nostrils as needed for congestion.  . SUMAtriptan (IMITREX) 50 MG tablet TAKE 1 TABLET (50 MG TOTAL) BY MOUTH AS NEEDED FOR MIGRAINE OR HEADACHE  . [DISCONTINUED] potassium chloride 20 MEQ TBCR Take 20 mEq by mouth daily.   No facility-administered encounter medications on file as of 10/30/2017.     Activities of Daily Living No flowsheet data found.  Patient Care Team: Nche, Charlene Brooke, NP as PCP - General (Internal Medicine) Kathrynn Ducking, MD as Consulting Physician (Neurology) Kristeen Miss, MD as Consulting Physician (Neurosurgery) Newt Minion, MD as Consulting Physician (Orthopedic Surgery)    Assessment:   This is a routine wellness examination for [name]. Physical assessment deferred to  PCP.  Exercise Activities and Dietary recommendations    Diet (meal preparation, eat out, water intake, caffeinated beverages, dairy products, fruits and vegetables): {Desc; diets:16563} Breakfast: Lunch:  Dinner:      Goals    None      Fall Risk Fall Risk  07/20/2016 06/29/2016 04/30/2016 02/27/2016 01/23/2016  Falls in the past year? No No Yes Yes Yes  Number falls in past yr: - - 2 or more - 2 or more  Injury with Fall? - - Yes No No  Comment - - Hit her head on the floor; shoulder pain; Did not go to the ED. - -  Risk Factor Category  - - High Fall Risk - -  Risk for fall due to : - - History of  fall(s);Impaired balance/gait - -  Follow up - - Falls prevention discussed - -    Depression Screen PHQ 2/9 Scores 07/20/2016 06/29/2016 04/30/2016 02/27/2016  PHQ - 2 Score 0 0 0 0  PHQ- 9 Score - - - -     Cognitive Function MMSE - Mini Mental State Exam 04/30/2016 01/06/2015  Orientation to time 5 5  Orientation to Place 5 5  Registration 3 3  Attention/ Calculation 5 5  Recall 2 3  Language- name 2 objects 2 2  Language- repeat 1 1  Language- follow 3 step command 3 3  Language- read & follow direction 1 1  Write a sentence 1 1  Copy design 1 1  Total score 29 30        Immunization History  Administered Date(s) Administered  . Influenza, High Dose Seasonal PF 04/30/2017  . Influenza,inj,Quad PF,6+ Mos 01/23/2016  . Influenza-Unspecified 02/26/2013, 03/12/2014, 03/05/2015  . Pneumococcal Conjugate-13 05/12/2014  . Pneumococcal Polysaccharide-23 02/26/2013, 06/20/2015    Screening Tests Health Maintenance  Topic Date Due  . TETANUS/TDAP  01/26/1960  . INFLUENZA VACCINE  01/02/2018  . COLONOSCOPY  01/30/2022  . DEXA SCAN  Completed  . PNA vac Low Risk Adult  Completed      Plan:   ***   I have personally reviewed and noted the following in the patient's chart:   . Medical and social history . Use of alcohol, tobacco or illicit drugs  . Current medications and supplements . Functional ability and status . Nutritional status . Physical activity . Advanced directives . List of other physicians . Hospitalizations, surgeries, and ER visits in previous 12 months . Vitals . Screenings to include cognitive, depression, and falls . Referrals and appointments  In addition, I have reviewed and discussed with patient certain preventive protocols, quality metrics, and best practice recommendations. A written personalized care plan for preventive services as well as general preventive health recommendations were provided to patient.     Shela Nevin,  [place]  10/23/2017

## 2017-10-30 ENCOUNTER — Ambulatory Visit: Payer: Self-pay | Admitting: Behavioral Health

## 2017-10-30 ENCOUNTER — Telehealth: Payer: Self-pay | Admitting: Nurse Practitioner

## 2017-10-30 ENCOUNTER — Other Ambulatory Visit: Payer: Self-pay | Admitting: Nurse Practitioner

## 2017-10-30 DIAGNOSIS — K2100 Gastro-esophageal reflux disease with esophagitis, without bleeding: Secondary | ICD-10-CM

## 2017-10-30 DIAGNOSIS — E785 Hyperlipidemia, unspecified: Secondary | ICD-10-CM

## 2017-10-30 DIAGNOSIS — F332 Major depressive disorder, recurrent severe without psychotic features: Secondary | ICD-10-CM

## 2017-10-30 DIAGNOSIS — I10 Essential (primary) hypertension: Secondary | ICD-10-CM

## 2017-10-30 NOTE — Telephone Encounter (Signed)
Copied from [place] [id number]. Topic: Quick Communication - See Telephone Encounter >> Oct 30, 2017 11:17 AM Ether Griffins B wrote: CRM for notification. See Telephone encounter for: 10/30/17.   Pt would like to have her gabapentin (NEURONTIN) 800 MG tablet increased to 4 times a day. She states the time in between the 3 is to much and is not lasting her during that time frame. She states it is so far between dosages the pain and nerve pain is so bad she ends up taking an extra one during the day to get by.

## 2017-10-30 NOTE — Telephone Encounter (Signed)
That is a dose higher than what I am comfortable with unfortunately. Has she considered other rxs?  Let's route to [place] as she may be checking inbox at some point.

## 2017-10-30 NOTE — Telephone Encounter (Signed)
[name] pls advise when you get a second.. Thank you- TLG

## 2017-10-31 NOTE — Telephone Encounter (Signed)
appt made with Dr. [name] to consult about this med 11/06/17

## 2017-10-31 NOTE — Progress Notes (Signed)
Subjective:   [name] is a 77 y.o. female who presents for Medicare Annual (Subsequent) preventive examination.  Review of Systems: No ROS.  Medicare Wellness Visit. Additional risk factors are reflected in the social history. Cardiac Risk Factors include: advanced age (>88men, >3 women);dyslipidemia;hypertension Sleep patterns: Takes Melatonin. Sleep varies. Sometimes only 4-5 hours.   Home Safety/Smoke Alarms: Feels safe in home. Smoke alarms in place.  Living environment; residence and Firearm Safety: No stairs. Lives alone in 1 story home. Walk in shower with bench. Has life alert necklace.    Female:         Mammo-active order       Dexa scan-  declines      CCS- last 2013. No result of follow up on file.       Objective:     Vitals: BP 132/80 (BP Location: Left Arm, Patient Position: Sitting, Cuff Size: Large)   Pulse 81   Ht 5\' 5"  (1.651 m)   Wt 239 lb (108.4 kg)   SpO2 96%   BMI 39.77 kg/m   Body mass index is 39.77 kg/m.  Advanced Directives 11/06/2017 11/10/2016 07/08/2016 06/17/2016 04/30/2016 02/25/2016 02/22/2016  Does Patient Have a Medical Advance Directive? Yes No No No Yes;No Yes No  Type of Academic librarian;Living will - - - - Press photographer -  Does patient want to make changes to medical advance directive? - - - - - - -  Copy of [place] in Chart? Yes - - - No - copy requested - -  Would patient like information on creating a medical advance directive? - No - Patient declined No - Patient declined - - - -  Pre-existing out of facility DNR order (yellow form or pink MOST form) - - - - - - -  Some encounter information is confidential and restricted. Go to Review Flowsheets activity to see all data.    Tobacco Social History   Tobacco Use  Smoking Status Never Smoker  Smokeless Tobacco Never Used     Counseling given: Not Answered   Clinical Intake: Pain : No/denies pain   Past  Medical History:  Diagnosis Date  . Anxiety   . Arthritis    djd  . Blood transfusion without reported diagnosis   . Cataracts, bilateral    immature   . Cellulitis    10/11/11 hospitalized for cellulitis   . Chicken pox   . Chronic back pain    scoliosis/stenosis  . Depression    takes Zoloft daily  . Depression   . Dysrhythmia    hx tachy palpitations  . Elbow fracture, left April 2015  . GERD (gastroesophageal reflux disease)    hx pud '98  . Glaucoma   . Headache(784.0)    migraines yrs ago  . History of bronchitis    many yrs ago  . Hyperlipemia    takes Atorvastatin daily  . Hypertension    takes HCTZ daily  . Insomnia   . Joint pain   . Joint swelling   . Migraine   . Neuromuscular disorder ([place])    peripheral neuropathy, FEET AND LEGS  . Peripheral neuropathy   . Peripheral neuropathy    takes Gabapentin daily  . Positive TB test   . 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 Radiculopathy, lumbar region   . Restless leg syndrome    takes Sinemet daily  .  Stroke [facility])    ? 6 months ago - tests okay - [facility]   . Vertigo    Past Surgical History:  Procedure Laterality Date  . ABDOMINAL HYSTERECTOMY  1975   bil oophorectomy  . APPENDECTOMY    . BACK SURGERY   MANY YRS AGO   laminectomy x3  . CARPAL TUNNEL RELEASE  07/09/2012   Procedure: CARPAL TUNNEL RELEASE;  Surgeon: Magnus Sinning, MD;  Location: [facility];  Service: Orthopedics;  Laterality: Left;  . cervical disc surgery x2  2011  . CHOLECYSTECTOMY  1993  . colonosocpy    . ESOPHAGOGASTRODUODENOSCOPY    . FINGER ARTHROPLASTY  07/09/2012   Procedure: FINGER ARTHROPLASTY;  Surgeon: Magnus Sinning, MD;  Location: [facility];  Service: Orthopedics;  Laterality: Left;  Interposition Arthroplasty [facility] Joint Thumb Left   . HEMATOMA EVACUATION  2011   s/p rt cea  . JOINT REPLACEMENT  '01   total knee replacement, LEFT  . left knee arthroscopy  2001  . LIPOMA EXCISION  07/09/2012   Procedure:  EXCISION LIPOMA;  Surgeon: Magnus Sinning, MD;  Location: [facility];  Service: Orthopedics;  Laterality: Left;  Excision Lipoma Dorsum Left Wrist   . LUMBAR LAMINECTOMY WITH COFLEX 1 LEVEL Bilateral 04/19/2015   Procedure: LUMBAR TWO-THREE LUMBAR LAMINECTOMY WITH COFLEX ;  Surgeon: Kristeen Miss, MD;  Location: [place] [facility];  Service: Neurosurgery;  Laterality: Bilateral;  Bilateral L23 laminectomy and foraminotomy with coflex  . LUMBAR LAMINECTOMY/DECOMPRESSION MICRODISCECTOMY Right 12/19/2015   Procedure: Right Lumbar two-three  Microdiskectomy;  Surgeon: Kristeen Miss, MD;  Location: [place] [facility];  Service: Neurosurgery;  Laterality: Right;  . ORIF ANKLE FRACTURE Right 09/14/2014   FIBULA   . ORIF ANKLE FRACTURE Right 09/14/2014   Procedure: OPEN REDUCTION INTERNAL FIXATION (ORIF) RIGHT ANKLE FRACTURE/SYNDESMOSIS ;  Surgeon: Wylene Simmer, MD;  Location: [place];  Service: Orthopedics;  Laterality: Right;  . right knee replacement     09/2011   . rt carotid enarterectomy  2011  . rt knee arthroscopy  '99  . SHOULDER ARTHROSCOPY Right   . TOTAL KNEE ARTHROPLASTY  09/13/2011   Procedure: TOTAL KNEE ARTHROPLASTY;  Surgeon: Magnus Sinning, MD;  Location: [facility];  Service: Orthopedics;  Laterality: Right;   Family History  Problem Relation Age of Onset  . Congestive Heart Failure Father   . Arthritis Father   . Hearing loss Father   . Heart disease Father   . Congestive Heart Failure Sister   . Arthritis Sister   . COPD Sister   . Depression Sister   . Early death Sister   . Hypertension Sister   . Alzheimer's disease Mother   . Arthritis Mother   . Diabetes Brother   . Cancer Brother        adrenal gland  . Testicular cancer Brother   . Arthritis Brother   . Heart disease Brother   . Hyperlipidemia Brother   . Hypertension Brother   . Arthritis Brother   . Cancer Brother        prostate  . Diabetes Brother   . Prostate cancer Brother   . Hypertension Brother   . Early death  Maternal Grandmother   . Early death Maternal Grandfather   . Early death Paternal Grandmother   . Early death Paternal Grandfather    Social History   Socioeconomic History  . Marital status: Widowed    Spouse name: Not on file  . Number of children: 2  .  Years of education: 86  . Highest education level: Not on file  Occupational History  . Not on file  Social Needs  . Financial resource strain: Not on file  . Food insecurity:    Worry: Not on file    Inability: Not on file  . Transportation needs:    Medical: Not on file    Non-medical: Not on file  Tobacco Use  . Smoking status: Never Smoker  . Smokeless tobacco: Never Used  Substance and Sexual Activity  . Alcohol use: No    Alcohol/week: 0.0 oz  . Drug use: No  . Sexual activity: Never    Birth control/protection: Surgical  Lifestyle  . Physical activity:    Days per week: Not on file    Minutes per session: Not on file  . Stress: Not on file  Relationships  . Social connections:    Talks on phone: Not on file    Gets together: Not on file    Attends religious service: Not on file    Active member of club or organization: Not on file    Attends meetings of clubs or organizations: Not on file    Relationship status: Not on file  Other Topics Concern  . Not on file  Social History Narrative   Patient is widowed and lives alone.   Patient has two sons.   Patient is retired.   Patient has a college education.   Patient is right-handed.   Patient drinks three glasses of Coke-soda daily.       Outpatient Encounter Medications as of 11/06/2017  Medication Sig  . acetaminophen (TYLENOL) 500 MG tablet Take 500 mg by mouth every 8 (eight) hours as needed for moderate pain. Not to exceed 3000mg  per day total (6 pills)  . amLODipine (NORVASC) 10 MG tablet TAKE 1 TABLET EVERY DAY FOR BLOOD PRESSURE  . aspirin EC 325 MG tablet Take 1 tablet (325 mg total) by mouth daily.  Marland Kitchen atorvastatin (LIPITOR) 40 MG tablet TAKE  1 TABLET DAILY AT 6 PM.  . cholecalciferol (VITAMIN D) 1000 units tablet Take 1,000 Units by mouth daily.  . Liniments (SALONPAS EX) Apply topically as needed.  Marland Kitchen losartan (COZAAR) 100 MG tablet TAKE 1 TABLET EVERY DAY  . metoprolol succinate (TOPROL-XL) 100 MG 24 hr tablet TAKE 1 TABLET EVERY DAY   TAKE WITH 50 MG TABLET  . metoprolol succinate (TOPROL-XL) 50 MG 24 hr tablet TAKE 1 TABLET EVERY DAY ALONG WITH 100 MG TABLET  . pantoprazole (PROTONIX) 40 MG tablet TAKE 1 TABLET EVERY DAY  . pyridOXINE (VITAMIN B-6) 50 MG tablet Take 50 mg by mouth daily.  . sertraline (ZOLOFT) 50 MG tablet TAKE 1 TABLET EVERY DAY  . sodium chloride (OCEAN) 0.65 % SOLN nasal spray Place 1 spray into both nostrils as needed for congestion.  . SUMAtriptan (IMITREX) 50 MG tablet TAKE 1 TABLET (50 MG TOTAL) BY MOUTH AS NEEDED FOR MIGRAINE OR HEADACHE  . [DISCONTINUED] gabapentin (NEURONTIN) 800 MG tablet Take 1 tablet (800 mg total) by mouth 3 (three) times daily. Fill on or after 07/27/2017.  . [DISCONTINUED] potassium chloride 20 MEQ TBCR Take 20 mEq by mouth daily.   No facility-administered encounter medications on file as of 11/06/2017.     Activities of Daily Living In your present state of health, do you have any difficulty performing the following activities: 11/06/2017 11/06/2017  Hearing? N N  Vision? N N  Difficulty concentrating or making decisions? N N  Walking or climbing stairs? Tempie Donning  Comment uses walker Uses a walker  Dressing or bathing? N N  Doing errands, shopping? N N  Preparing Food and eating ? N -  Using the Toilet? N -  In the past six months, have you accidently leaked urine? N -  Do you have problems with loss of bowel control? N -  Managing your Medications? Y -  Comment Son does it -  Managing your Finances? Y -  Comment Son does it -  Runner, broadcasting/film/video? N -  Some recent data might be hidden    Patient Care Team: Nche, Charlene Brooke, NP as PCP - General  (Internal Medicine) Kristeen Miss, MD as Consulting Physician (Neurosurgery) Newt Minion, MD as Consulting Physician (Orthopedic Surgery) Lucille Passy, MD as Consulting Physician (Family Medicine)    Assessment:   This is a routine wellness examination for Titiana. Physical assessment deferred to PCP.  Exercise Activities and Dietary recommendations Current Exercise Habits: Home exercise routine, Type of exercise: walking, Time (Minutes): 15, Intensity: Mild   Diet (meal preparation, eat out, water intake, caffeinated beverages, dairy products, fruits and vegetables): well balanced, on average, 3 meals per day Pt states she drink a lot of Coke and not enough water. Encouraged to drink more water.  Goals    . Decrease Cokes to 2 per day.      Replace with water flavors and or sparkling water.        Fall Risk Fall Risk  11/06/2017 11/06/2017 07/20/2016 06/29/2016 04/30/2016  Falls in the past year? No No No No Yes  Number falls in past yr: - - - - 2 or more  Injury with Fall? - - - - Yes  Comment - - - - Hit her head on the floor; shoulder pain; Did not go to the ED.  Risk Factor Category  - - - - High Fall Risk  Risk for fall due to : - - - - History of fall(s);Impaired balance/gait  Follow up - - - - Falls prevention discussed    Depression Screen PHQ 2/9 Scores 11/06/2017 11/06/2017 07/20/2016 06/29/2016  PHQ - 2 Score 0 0 0 0  PHQ- 9 Score - - - -     Cognitive Function MMSE - Mini Mental State Exam 11/06/2017 04/30/2016 01/06/2015  Orientation to time 5 5 5   Orientation to Place 5 5 5   Registration 3 3 3   Attention/ Calculation 5 5 5   Recall 3 2 3   Language- name 2 objects 2 2 2   Language- repeat 1 1 1   Language- follow 3 step command 3 3 3   Language- read & follow direction 1 1 1   Write a sentence 1 1 1   Copy design 1 1 1   Total score 30 29 30         Immunization History  Administered Date(s) Administered  . Influenza, High Dose Seasonal PF 04/30/2017  .  Influenza,inj,Quad PF,6+ Mos 01/23/2016  . Influenza-Unspecified 02/26/2013, 03/12/2014, 03/05/2015  . Pneumococcal Conjugate-13 05/12/2014  . Pneumococcal Polysaccharide-23 02/26/2013, 06/20/2015    Screening Tests Health Maintenance  Topic Date Due  . TETANUS/TDAP  01/26/1960  . INFLUENZA VACCINE  01/02/2018  . COLONOSCOPY  01/30/2022  . DEXA SCAN  Completed  . PNA vac Low Risk Adult  Completed      Plan:   Follow up with Dr.Aron today as scheduled  Please schedule your next medicare wellness visit with me in 1 yr.  Continue  to eat heart healthy diet (full of fruits, vegetables, whole grains, lean protein, water--limit salt, fat, and sugar intake) and increase physical activity as tolerated.  Continue doing brain stimulating activities (puzzles, reading, adult coloring books, staying active) to keep memory sharp.     I have personally reviewed and noted the following in the patient's chart:   . Medical and social history . Use of alcohol, tobacco or illicit drugs  . Current medications and supplements . Functional ability and status . Nutritional status . Physical activity . Advanced directives . List of other physicians . Hospitalizations, surgeries, and ER visits in previous 12 months . Vitals . Screenings to include cognitive, depression, and falls . Referrals and appointments  In addition, I have reviewed and discussed with patient certain preventive protocols, quality metrics, and best practice recommendations. A written personalized care plan for preventive services as well as general preventive health recommendations were provided to patient.     Shela Nevin, [place]  11/06/2017

## 2017-11-06 ENCOUNTER — Encounter: Payer: Self-pay | Admitting: Family Medicine

## 2017-11-06 ENCOUNTER — Ambulatory Visit (INDEPENDENT_AMBULATORY_CARE_PROVIDER_SITE_OTHER): Payer: Medicare HMO | Admitting: Behavioral Health

## 2017-11-06 ENCOUNTER — Encounter: Payer: Self-pay | Admitting: Behavioral Health

## 2017-11-06 ENCOUNTER — Ambulatory Visit (INDEPENDENT_AMBULATORY_CARE_PROVIDER_SITE_OTHER): Payer: Medicare HMO | Admitting: Family Medicine

## 2017-11-06 ENCOUNTER — Ambulatory Visit: Payer: Self-pay | Admitting: Behavioral Health

## 2017-11-06 VITALS — BP 132/80 | HR 81 | Temp 99.0°F | Ht 65.0 in | Wt 239.0 lb

## 2017-11-06 VITALS — BP 132/80 | HR 81 | Ht 65.0 in | Wt 239.0 lb

## 2017-11-06 DIAGNOSIS — G629 Polyneuropathy, unspecified: Secondary | ICD-10-CM | POA: Diagnosis not present

## 2017-11-06 DIAGNOSIS — R609 Edema, unspecified: Secondary | ICD-10-CM | POA: Diagnosis not present

## 2017-11-06 DIAGNOSIS — G63 Polyneuropathy in diseases classified elsewhere: Secondary | ICD-10-CM | POA: Diagnosis not present

## 2017-11-06 DIAGNOSIS — Z0000 Encounter for general adult medical examination without abnormal findings: Secondary | ICD-10-CM | POA: Diagnosis not present

## 2017-11-06 LAB — COMPREHENSIVE METABOLIC PANEL
ALK PHOS: 86 U/L (ref 39–117)
ALT: 7 U/L (ref 0–35)
AST: 17 U/L (ref 0–37)
Albumin: 4.2 g/dL (ref 3.5–5.2)
BUN: 18 mg/dL (ref 6–23)
CALCIUM: 9.9 mg/dL (ref 8.4–10.5)
CO2: 21 mEq/L (ref 19–32)
Chloride: 101 mEq/L (ref 96–112)
Creatinine, Ser: 1.04 mg/dL (ref 0.40–1.20)
GFR: 54.79 mL/min — AB (ref >60.00)
GLUCOSE: 143 mg/dL — AB (ref 70–99)
POTASSIUM: 3.5 meq/L (ref 3.5–5.1)
Sodium: 135 mEq/L (ref 135–145)
TOTAL PROTEIN: 7.7 g/dL (ref 6.0–8.3)
Total Bilirubin: 0.4 mg/dL (ref 0.2–1.2)

## 2017-11-06 LAB — CBC
HEMATOCRIT: 42.1 % (ref 36.0–46.0)
Hemoglobin: 14.1 g/dL (ref 12.0–15.0)
MCHC: 33.6 g/dL (ref 30.0–36.0)
MCV: 106.1 fl — AB (ref 78.0–100.0)
Platelets: 274 10*3/uL (ref 150.0–400.0)
RBC: 3.97 Mil/uL (ref 3.87–5.11)
RDW: 16.1 % — ABNORMAL HIGH (ref 11.5–15.5)
WBC: 9.3 10*3/uL (ref 4.0–10.5)

## 2017-11-06 LAB — BRAIN NATRIURETIC PEPTIDE: PRO B NATRI PEPTIDE: 38 pg/mL (ref 0.0–100.0)

## 2017-11-06 LAB — TSH: TSH: 5.97 u[IU]/mL — AB (ref 0.35–4.50)

## 2017-11-06 MED ORDER — GABAPENTIN 600 MG PO TABS: 600.0000 mg | ORAL_TABLET | Freq: Four times a day (QID) | ORAL | 3 refills | Status: DC

## 2017-11-06 NOTE — Patient Instructions (Signed)
Follow up with Dr.Aron today as scheduled  Please schedule your next medicare wellness visit with me in 1 yr.  Continue to eat heart healthy diet (full of fruits, vegetables, whole grains, lean protein, water--limit salt, fat, and sugar intake) and increase physical activity as tolerated.  Continue doing brain stimulating activities (puzzles, reading, adult coloring books, staying active) to keep memory sharp.    [name] , Thank you for taking time to come for your Medicare Wellness Visit. I appreciate your ongoing commitment to your health goals. Please review the following plan we discussed and let me know if I can assist you in the future.   These are the goals we discussed: Goals    . Decrease Cokes to 2 per day.      Replace with water flavors and or sparkling water.        This is a list of the screening recommended for you and due dates:  Health Maintenance  Topic Date Due  . Tetanus Vaccine  01/26/1960  . Flu Shot  01/02/2018  . Colon Cancer Screening  01/30/2022  . DEXA scan (bone density measurement)  Completed  . Pneumonia vaccines  Completed

## 2017-11-06 NOTE — Patient Instructions (Addendum)
Great to meet you.  We are changing your Gabapentin to 600 mg four times daily.  Keep me updated.  I will call you with your lab results from today and you can view them online.

## 2017-11-06 NOTE — Progress Notes (Signed)
Subjective:   Patient ID: [name], female    DOB: Mar 10, 1941, 77 y.o.   MRN: [id number]  [name] is a pleasant 77 y.o. year old female who presents to clinic today with Medication discussion (Patient is here today to discuss increasing of Gabapentin 800mg  to qid.  )  on 11/06/2017  HPI:  Pt is new to me.  [name] [name]'s patient.  Chart reviewed.  She has chronic peripheral neuropathy, unknown cause. She reports being evaluated by a neurologist. Currently taking 2400 mg (800 mg three times daily) of gabapentin and would like this increased to four times daily.  She feels waiting 8 hours between doses is too long- having break through neuropathic pain.  LE edema- started about a month ago.  She does not feel it is getting worse but not getting better by keeping her feel elevated while seated.  Although she does admit that it is better when she first wakes up in the morning.  Has had some intermittent SOB.  Current Outpatient Medications on File Prior to Visit  Medication Sig Dispense Refill  . acetaminophen (TYLENOL) 500 MG tablet Take 500 mg by mouth every 8 (eight) hours as needed for moderate pain. Not to exceed 3000mg  per day total (6 pills)    . amLODipine (NORVASC) 10 MG tablet TAKE 1 TABLET EVERY DAY FOR BLOOD PRESSURE 90 tablet 0  . aspirin EC 325 MG tablet Take 1 tablet (325 mg total) by mouth daily. 90 tablet 3  . atorvastatin (LIPITOR) 40 MG tablet TAKE 1 TABLET DAILY AT 6 PM. 90 tablet 0  . cholecalciferol (VITAMIN D) 1000 units tablet Take 1,000 Units by mouth daily.    Marland Kitchen gabapentin (NEURONTIN) 800 MG tablet Take 1 tablet (800 mg total) by mouth 3 (three) times daily. Fill on or after 07/27/2017. 90 tablet 4  . Liniments (SALONPAS EX) Apply topically as needed.    Marland Kitchen losartan (COZAAR) 100 MG tablet TAKE 1 TABLET EVERY DAY 90 tablet 0  . metoprolol succinate (TOPROL-XL) 100 MG 24 hr tablet TAKE 1 TABLET EVERY DAY   TAKE WITH 50 MG TABLET 90 tablet 0  .  metoprolol succinate (TOPROL-XL) 50 MG 24 hr tablet TAKE 1 TABLET EVERY DAY ALONG WITH 100 MG TABLET 90 tablet 0  . pantoprazole (PROTONIX) 40 MG tablet TAKE 1 TABLET EVERY DAY 90 tablet 0  . pyridOXINE (VITAMIN B-6) 50 MG tablet Take 50 mg by mouth daily.    . sertraline (ZOLOFT) 50 MG tablet TAKE 1 TABLET EVERY DAY 90 tablet 0  . sodium chloride (OCEAN) 0.65 % SOLN nasal spray Place 1 spray into both nostrils as needed for congestion.  0  . SUMAtriptan (IMITREX) 50 MG tablet TAKE 1 TABLET (50 MG TOTAL) BY MOUTH AS NEEDED FOR MIGRAINE OR HEADACHE 20 tablet 0  . [DISCONTINUED] potassium chloride 20 MEQ TBCR Take 20 mEq by mouth daily. 3 tablet 0   No current facility-administered medications on file prior to visit.     Allergies  Allergen Reactions  . Contrast Media [Iodinated Diagnostic Agents] Anaphylaxis  . Acyclovir And Related Other (See Comments)    Reaction:  Unknown   . Sulfa Drugs Cross Reactors Nausea And Vomiting    Past Medical History:  Diagnosis Date  . Anxiety   . Arthritis    djd  . Blood transfusion without reported diagnosis   . Cataracts, bilateral    immature   . Cellulitis    10/11/11 hospitalized for  cellulitis   . Chicken pox   . Chronic back pain    scoliosis/stenosis  . Depression    takes Zoloft daily  . Depression   . Dysrhythmia    hx tachy palpitations  . Elbow fracture, left April 2015  . GERD (gastroesophageal reflux disease)    hx pud '98  . Glaucoma   . Headache(784.0)    migraines yrs ago  . History of bronchitis    many yrs ago  . Hyperlipemia    takes Atorvastatin daily  . Hypertension    takes HCTZ daily  . Insomnia   . Joint pain   . Joint swelling   . Migraine   . Neuromuscular disorder ([place])    peripheral neuropathy, FEET AND LEGS  . Peripheral neuropathy   . Peripheral neuropathy    takes Gabapentin daily  . Positive TB test   . 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 Radiculopathy, lumbar region   .  Restless leg syndrome    takes Sinemet daily  . Stroke [facility])    ? 6 months ago - tests okay - [facility]   . Vertigo     Past Surgical History:  Procedure Laterality Date  . ABDOMINAL HYSTERECTOMY  1975   bil oophorectomy  . APPENDECTOMY    . BACK SURGERY   MANY YRS AGO   laminectomy x3  . CARPAL TUNNEL RELEASE  07/09/2012   Procedure: CARPAL TUNNEL RELEASE;  Surgeon: Magnus Sinning, MD;  Location: [facility];  Service: Orthopedics;  Laterality: Left;  . cervical disc surgery x2  2011  . CHOLECYSTECTOMY  1993  . colonosocpy    . ESOPHAGOGASTRODUODENOSCOPY    . FINGER ARTHROPLASTY  07/09/2012   Procedure: FINGER ARTHROPLASTY;  Surgeon: Magnus Sinning, MD;  Location: [facility];  Service: Orthopedics;  Laterality: Left;  Interposition Arthroplasty [facility] Joint Thumb Left   . HEMATOMA EVACUATION  2011   s/p rt cea  . JOINT REPLACEMENT  '01   total knee replacement, LEFT  . left knee arthroscopy  2001  . LIPOMA EXCISION  07/09/2012   Procedure: EXCISION LIPOMA;  Surgeon: Magnus Sinning, MD;  Location: [facility];  Service: Orthopedics;  Laterality: Left;  Excision Lipoma Dorsum Left Wrist   . LUMBAR LAMINECTOMY WITH COFLEX 1 LEVEL Bilateral 04/19/2015   Procedure: LUMBAR TWO-THREE LUMBAR LAMINECTOMY WITH COFLEX ;  Surgeon: Kristeen Miss, MD;  Location: [place] [facility];  Service: Neurosurgery;  Laterality: Bilateral;  Bilateral L23 laminectomy and foraminotomy with coflex  . LUMBAR LAMINECTOMY/DECOMPRESSION MICRODISCECTOMY Right 12/19/2015   Procedure: Right Lumbar two-three  Microdiskectomy;  Surgeon: Kristeen Miss, MD;  Location: [place] [facility];  Service: Neurosurgery;  Laterality: Right;  . ORIF ANKLE FRACTURE Right 09/14/2014   FIBULA   . ORIF ANKLE FRACTURE Right 09/14/2014   Procedure: OPEN REDUCTION INTERNAL FIXATION (ORIF) RIGHT ANKLE FRACTURE/SYNDESMOSIS ;  Surgeon: Wylene Simmer, MD;  Location: [place];  Service: Orthopedics;  Laterality: Right;  . right knee replacement     09/2011   . rt carotid  enarterectomy  2011  . rt knee arthroscopy  '99  . SHOULDER ARTHROSCOPY Right   . TOTAL KNEE ARTHROPLASTY  09/13/2011   Procedure: TOTAL KNEE ARTHROPLASTY;  Surgeon: Magnus Sinning, MD;  Location: [facility];  Service: Orthopedics;  Laterality: Right;    Family History  Problem Relation Age of Onset  . Congestive Heart Failure Father   . Arthritis Father   . Hearing  loss Father   . Heart disease Father   . Congestive Heart Failure Sister   . Arthritis Sister   . COPD Sister   . Depression Sister   . Early death Sister   . Hypertension Sister   . Alzheimer's disease Mother   . Arthritis Mother   . Diabetes Brother   . Cancer Brother        adrenal gland  . Testicular cancer Brother   . Arthritis Brother   . Heart disease Brother   . Hyperlipidemia Brother   . Hypertension Brother   . Arthritis Brother   . Cancer Brother        prostate  . Diabetes Brother   . Prostate cancer Brother   . Hypertension Brother   . Early death Maternal Grandmother   . Early death Maternal Grandfather   . Early death Paternal Grandmother   . Early death Paternal Grandfather     Social History   Socioeconomic History  . Marital status: Widowed    Spouse name: Not on file  . Number of children: 2  . Years of education: 47  . Highest education level: Not on file  Occupational History  . Not on file  Social Needs  . Financial resource strain: Not on file  . Food insecurity:    Worry: Not on file    Inability: Not on file  . Transportation needs:    Medical: Not on file    Non-medical: Not on file  Tobacco Use  . Smoking status: Never Smoker  . Smokeless tobacco: Never Used  Substance and Sexual Activity  . Alcohol use: No    Alcohol/week: 0.0 oz  . Drug use: No  . Sexual activity: Never    Birth control/protection: Surgical  Lifestyle  . Physical activity:    Days per week: Not on file    Minutes per session: Not on file  . Stress: Not on file  Relationships  . Social  connections:    Talks on phone: Not on file    Gets together: Not on file    Attends religious service: Not on file    Active member of club or organization: Not on file    Attends meetings of clubs or organizations: Not on file    Relationship status: Not on file  . Intimate partner violence:    Fear of current or ex partner: Not on file    Emotionally abused: Not on file    Physically abused: Not on file    Forced sexual activity: Not on file  Other Topics Concern  . Not on file  Social History Narrative   Patient is widowed and lives alone.   Patient has two sons.   Patient is retired.   Patient has a college education.   Patient is right-handed.   Patient drinks three glasses of Coke-soda daily.      The PMH, PSH, Social History, Family History, Medications, and allergies have been reviewed in [facility], and have been updated if relevant.  Review of Systems  Constitutional: Negative.   Respiratory: Positive for shortness of breath. Negative for apnea, cough, choking, chest tightness, wheezing and stridor.   Cardiovascular: Positive for leg swelling. Negative for chest pain and palpitations.  Musculoskeletal: Positive for arthralgias and gait problem.  Neurological: Positive for numbness.  Hematological: Negative.   Psychiatric/Behavioral: Negative.   All other systems reviewed and are negative.      Objective:    BP 132/80 (BP Location: Left  Arm, Patient Position: Sitting, Cuff Size: Large)   Pulse 81   Temp 99 F (37.2 C) (Oral)   Ht 5\' 5"  (1.651 m)   Wt 239 lb (108.4 kg)   SpO2 96%   BMI 39.77 kg/m    Physical Exam  Constitutional: [name] oriented to person, place, and time. She appears well-developed and well-nourished. No distress.  HENT:  Head: Normocephalic and atraumatic.  Eyes: EOM are normal.  Neck: Normal range of motion.  Cardiovascular: Normal rate and regular rhythm.  Pulmonary/Chest: Effort normal and breath sounds normal.  Musculoskeletal: She  exhibits edema.  Neurological: [name] alert and oriented to person, place, and time. No cranial nerve deficit.  Skin: Skin is warm and dry. [name] not diaphoretic.  Psychiatric: She has a normal mood and affect. Judgment and thought content normal.  Nursing note and vitals reviewed.         Assessment & Plan:   Polyneuropathy in other diseases classified elsewhere [facility]) No follow-ups on file.

## 2017-11-06 NOTE — Progress Notes (Signed)
I reviewed health advisor's note, was available for consultation, and agree with documentation and plan.  

## 2017-11-06 NOTE — Assessment & Plan Note (Signed)
New over past month per pt. Non pitting, bilateral. [name] endorsing some intermittent SOB. Lung exam reassuring. Will check labs today as part of initial edema work up. The patient indicates understanding of these issues and agrees with the plan. Orders Placed This Encounter  Procedures  . CBC  . B Nat Peptide  . Comprehensive metabolic panel  . TSH

## 2017-11-06 NOTE — Assessment & Plan Note (Signed)
Explained to pt that I am not comfortable prescribing a maximum daily dose of gabapentin higher than 2400 mg per day.  I did offer referral back to neurology versus trying an alternative agent versus trying a lower dose of gabapentin more frequently.  She chose the later option. D/c Gabapentin 800 mg three times daily. Start Gabapentin 600 mg four times daily. Call or return to clinic prn if these symptoms worsen or fail to improve as anticipated. The patient indicates understanding of these issues and agrees with the plan.

## 2017-11-07 ENCOUNTER — Telehealth: Payer: Self-pay | Admitting: Nurse Practitioner

## 2017-11-07 ENCOUNTER — Other Ambulatory Visit: Payer: Self-pay | Admitting: Family Medicine

## 2017-11-07 DIAGNOSIS — R946 Abnormal results of thyroid function studies: Secondary | ICD-10-CM

## 2017-11-07 MED ORDER — GABAPENTIN 600 MG PO TABS: 600.0000 mg | ORAL_TABLET | Freq: Four times a day (QID) | ORAL | 0 refills | Status: DC

## 2017-11-07 NOTE — Telephone Encounter (Signed)
Rx change to 3 mo supply for the pt.

## 2017-11-07 NOTE — Telephone Encounter (Signed)
Copied from [place]. Topic: Quick Communication - Rx Refill/Question >> Nov 07, 2017  9:22 AM Scherrie Gerlach wrote: Medication: gabapentin (NEURONTIN) 600 MG tablet Pt states [employer] prefers a 90 day Rx and pt would like you to call [employer] and change this to a 90 day please.  [place], [place] [phone] (Phone) [phone] (Fax)  Pt would like you to call her when this is done as she wants tell [employer] to put a rush on this

## 2017-11-22 ENCOUNTER — Telehealth: Payer: Self-pay | Admitting: Family Medicine

## 2017-11-22 NOTE — Telephone Encounter (Signed)
Spoke with the pt, she stated she hurt her lower back and she has been taking extra strength tylenol and heat but it is not helping.   Advise the pt Nche & [name] is not in the office, will have another provider to address, advise the pt she might needs an appt for this.   Please advise.      Copied from [place] [phone]. Topic: General - Other >> Nov 22, 2017  1:06 PM Mcneil, Ja-Kwan wrote: Reason for CRM: Pt states she needs a muscle relaxer. Pt requests a call back from the office. Cb# [phone]

## 2017-11-22 NOTE — Telephone Encounter (Signed)
Recommend appt

## 2017-11-22 NOTE — Telephone Encounter (Signed)
Call back and offer an appt with another location later on today or Saturday clinic. Pt stated she will find her own way for right now.

## 2017-11-22 NOTE — Telephone Encounter (Signed)
Pt is aware she needs an appt.

## 2017-11-30 ENCOUNTER — Other Ambulatory Visit: Payer: Self-pay | Admitting: Nurse Practitioner

## 2017-11-30 DIAGNOSIS — G8929 Other chronic pain: Secondary | ICD-10-CM

## 2017-11-30 DIAGNOSIS — R51 Headache: Principal | ICD-10-CM

## 2017-12-23 ENCOUNTER — Ambulatory Visit: Payer: Self-pay

## 2017-12-23 DIAGNOSIS — S39012A Strain of muscle, fascia and tendon of lower back, initial encounter: Secondary | ICD-10-CM

## 2017-12-23 MED ORDER — METHOCARBAMOL 500 MG PO TABS: 500.0000 mg | ORAL_TABLET | Freq: Two times a day (BID) | ORAL | 0 refills | Status: AC | PRN

## 2017-12-23 NOTE — Telephone Encounter (Signed)
Pt. Reports she lifted something heavy this weekend, and now having muscle spasms. Low back, more on the right side. Does not radiate anywhere. Has tried hot and ice packs, Tylenol and Naproxen - nothing is helping. Requesting a muscle relaxer. Please advise pt.  Answer Assessment - Initial Assessment Questions 1. ONSET: "When did the pain begin?"      This weekend 2. LOCATION: "Where does it hurt?" (upper, mid or lower back)     Low back on the right side 3. SEVERITY: "How bad is the pain?"  (e.g., Scale 1-10; mild, moderate, or severe)   - MILD (1-3): doesn't interfere with normal activities    - MODERATE (4-7): interferes with normal activities or awakens from sleep    - SEVERE (8-10): excruciating pain, unable to do any normal activities      8 4. PATTERN: "Is the pain constant?" (e.g., yes, no; constant, intermittent)      Muscle spasm 5. RADIATION: "Does the pain shoot into your legs or elsewhere?"     No 6. CAUSE:  "What do you think is causing the back pain?"      Lifted something heavy this weekend 7. BACK OVERUSE:  "Any recent lifting of heavy objects, strenuous work or exercise?"     Yes 8. MEDICATIONS: "What have you taken so far for the pain?" (e.g., nothing, acetaminophen, NSAIDS)     Tylenol and Naproxen 9. NEUROLOGIC SYMPTOMS: "Do you have any weakness, numbness, or problems with bowel/bladder control?"     No 10. OTHER SYMPTOMS: "Do you have any other symptoms?" (e.g., fever, abdominal pain, burning with urination, blood in urine)       No 11. PREGNANCY: "Is there any chance you are pregnant?" (e.g., yes, no; LMP)       N/A  Protocols used: BACK PAIN-A-AH

## 2017-12-23 NOTE — Telephone Encounter (Signed)
Pt verbalized understand.  

## 2017-12-30 ENCOUNTER — Telehealth: Payer: Self-pay | Admitting: Nurse Practitioner

## 2017-12-30 ENCOUNTER — Encounter: Payer: Self-pay | Admitting: Nurse Practitioner

## 2017-12-30 ENCOUNTER — Other Ambulatory Visit: Payer: Self-pay | Admitting: Nurse Practitioner

## 2017-12-30 DIAGNOSIS — M79674 Pain in right toe(s): Secondary | ICD-10-CM

## 2017-12-30 NOTE — Telephone Encounter (Signed)
I am sorry she is in Pain. Unfortunately I am unable to give any recommendation without examination of her foot. I have entered urgent referral to podiatry.

## 2017-12-30 NOTE — Telephone Encounter (Signed)
Pt verbalized understand.  

## 2017-12-30 NOTE — Telephone Encounter (Signed)
Copied from [place] [id number]. Topic: Quick Communication - See Telephone Encounter >> Dec 30, 2017  7:18 AM Marja Kays F wrote: Pt is experiencing severe nerve pain in her toe -she states that she has tried everything possible to get the paint to stop, heat , ice medication wraps and nothing is working and she just does not know what to do   Best number is [phone]

## 2017-12-30 NOTE — Telephone Encounter (Signed)
Spoke with the pt, she stated her right forth toes is painful, more like nerve pain, pt denied any other symptoms,no discoloration or injury to this area. Pt stated she tried tylenol extra strength, heat and ice,gabapintin, naproxen,but nothing help. This started 2 days ago. Offer an appt, but the patient has transportation issue. Please advise.

## 2017-12-31 ENCOUNTER — Ambulatory Visit: Payer: Medicare Other | Admitting: Sports Medicine

## 2018-01-27 ENCOUNTER — Other Ambulatory Visit: Payer: Self-pay | Admitting: Nurse Practitioner

## 2018-01-27 DIAGNOSIS — R51 Headache: Secondary | ICD-10-CM

## 2018-01-27 DIAGNOSIS — R519 Headache, unspecified: Secondary | ICD-10-CM

## 2018-01-27 DIAGNOSIS — M79671 Pain in right foot: Secondary | ICD-10-CM

## 2018-01-28 NOTE — Telephone Encounter (Signed)
Ok to send in. Please advise

## 2018-01-28 NOTE — Telephone Encounter (Signed)
Spoke with the pt, she stated she doesn't want to go back and see Dr. Jannifer Franklin again. [name] hoping Baldo Ash can manage this this med along with other meds. Please advise, pt is out of this med.

## 2018-01-28 NOTE — Telephone Encounter (Signed)
I am happy to enter referral to another neurology.  migraines are not controlled with imitrex. Excessive use of this medication leads to rebound headaches. I recommend she sees a neurologist for adequate migraine management. Let me know if she want referral entered.

## 2018-01-28 NOTE — Addendum Note (Signed)
Addended byShawnie Pons on: 01/28/2018 11:53 AM   Modules accepted: Orders

## 2018-01-28 NOTE — Telephone Encounter (Signed)
Pt request referral to neurology (cant this be urgent?) for migraine and she also want to see them for nerve pain in her toes as well (she doesn't want to see podiatrist).   Pt has an appt with Nche 02/11/18 for 6 mo follow up. Advise the pt if she need me to move up the appt sooner to address migrain as well ( she needs to let me know).   Referral entered per [facility]. urgent

## 2018-01-29 ENCOUNTER — Encounter: Payer: Self-pay | Admitting: Neurology

## 2018-01-29 ENCOUNTER — Telehealth: Payer: Self-pay | Admitting: Nurse Practitioner

## 2018-01-29 NOTE — Telephone Encounter (Signed)
[name] is aware.   Pt need an appt to see [place] if need medication send in.

## 2018-01-29 NOTE — Telephone Encounter (Signed)
Copied from [place] [id number]. Topic: Quick Communication - See Telephone Encounter >> Jan 29, 2018  1:53 PM Gardiner Ramus wrote: CRM for notification. See Telephone encounter for: 01/29/18. Pt called and stated that she has an appointment with necrologist 03/25/18. Pt would like Nche to know that she might need help before that.

## 2018-01-31 ENCOUNTER — Other Ambulatory Visit: Payer: Self-pay | Admitting: Nurse Practitioner

## 2018-01-31 DIAGNOSIS — G8929 Other chronic pain: Secondary | ICD-10-CM

## 2018-01-31 DIAGNOSIS — R51 Headache: Principal | ICD-10-CM

## 2018-02-11 ENCOUNTER — Ambulatory Visit: Payer: Medicare HMO | Admitting: Nurse Practitioner

## 2018-02-19 ENCOUNTER — Telehealth: Payer: Self-pay | Admitting: Nurse Practitioner

## 2018-02-19 ENCOUNTER — Other Ambulatory Visit: Payer: Self-pay | Admitting: Family Medicine

## 2018-02-19 NOTE — Telephone Encounter (Signed)
Please help.

## 2018-02-19 NOTE — Telephone Encounter (Signed)
Called pt but unable to leave vm. Need to inform the pt that rx sent in and she need to see Wilfred Lacy for more refills.

## 2018-02-19 NOTE — Telephone Encounter (Signed)
Refill sent. She needs and office visit with me prior to getting additional refills

## 2018-02-19 NOTE — Telephone Encounter (Signed)
Gabapentin 600 mg refill request.   Looks like it has been pended by Dr. [name].

## 2018-02-19 NOTE — Telephone Encounter (Signed)
Copied from [place] [id number]. Topic: Quick Communication - Rx Refill/Question >> Feb 19, 2018  2:06 PM Cecelia Byars, NT wrote: Medication:    gabapentin (NEURONTIN) 600 MG tablet    Has the patient contacted their pharmacy? yes  (Agent: If no, request that the patient contact the pharmacy for the refill. (Agent: If yes, when and what did the pharmacy advise?  Preferred Pharmacy (with phone number or street name  [place], [place] [phone] (Phone) [phone] (Fax)    Agent: Please be advised that RX refills may take up to 3 business days. We ask that you follow-up with your pharmacy.

## 2018-02-25 ENCOUNTER — Telehealth: Payer: Self-pay | Admitting: Nurse Practitioner

## 2018-02-25 NOTE — Telephone Encounter (Signed)
Pt is aware that she need to see Baldo Ash before refill can send in.

## 2018-02-25 NOTE — Telephone Encounter (Signed)
Copied from [place] [id number]. Topic: Quick Communication - Rx Refill/Question >> Feb 25, 2018 12:39 PM Bea Graff, NT wrote: Medication: SUMAtriptan (IMITREX) 50 MG tablet  Has the patient contacted their pharmacy? Yes.   (Agent: If no, request that the patient contact the pharmacy for the refill.) (Agent: If yes, when and what did the pharmacy advise?)  Preferred Pharmacy (with phone number or street name): [id number] [place], [place] [phone] (Phone) [phone] (Fax)    Agent: Please be advised that RX refills may take up to 3 business days. We ask that you follow-up with your pharmacy.

## 2018-02-25 NOTE — Telephone Encounter (Signed)
sumatriptan refill Last Refill: 01/31/18 # 9 Last OV: 04/30/18 PCP: [facility]: [place], [place]   Upcoming appointment 03/05/18

## 2018-03-05 ENCOUNTER — Ambulatory Visit: Payer: Self-pay | Admitting: Nurse Practitioner

## 2018-03-12 ENCOUNTER — Ambulatory Visit: Payer: Self-pay | Admitting: Nurse Practitioner

## 2018-03-14 ENCOUNTER — Ambulatory Visit: Payer: Medicare HMO | Admitting: Nurse Practitioner

## 2018-03-21 ENCOUNTER — Other Ambulatory Visit: Payer: Self-pay | Admitting: Family Medicine

## 2018-03-21 DIAGNOSIS — F332 Major depressive disorder, recurrent severe without psychotic features: Secondary | ICD-10-CM

## 2018-03-24 ENCOUNTER — Encounter: Payer: Self-pay | Admitting: Nurse Practitioner

## 2018-03-24 NOTE — Telephone Encounter (Signed)
[name] please advise, pt last ov with you was 01/2017, saw [name] 11/06/2017. No future appt.

## 2018-03-25 ENCOUNTER — Other Ambulatory Visit: Payer: Self-pay | Admitting: Family Medicine

## 2018-03-25 ENCOUNTER — Encounter

## 2018-03-25 ENCOUNTER — Ambulatory Visit: Payer: Self-pay | Admitting: Neurology

## 2018-03-25 DIAGNOSIS — I10 Essential (primary) hypertension: Secondary | ICD-10-CM

## 2018-03-25 DIAGNOSIS — E785 Hyperlipidemia, unspecified: Secondary | ICD-10-CM

## 2018-03-25 DIAGNOSIS — K2100 Gastro-esophageal reflux disease with esophagitis, without bleeding: Secondary | ICD-10-CM

## 2018-03-25 NOTE — Telephone Encounter (Signed)
30 days supply sent per [facility]. Pt needs to see PCP for more refills.

## 2018-04-09 ENCOUNTER — Encounter: Payer: Self-pay | Admitting: Nurse Practitioner

## 2018-04-09 ENCOUNTER — Ambulatory Visit (INDEPENDENT_AMBULATORY_CARE_PROVIDER_SITE_OTHER): Payer: Medicare HMO | Admitting: Nurse Practitioner

## 2018-04-09 VITALS — BP 138/82 | HR 76 | Temp 98.7°F | Ht 65.0 in | Wt 239.0 lb

## 2018-04-09 DIAGNOSIS — R739 Hyperglycemia, unspecified: Secondary | ICD-10-CM

## 2018-04-09 DIAGNOSIS — G63 Polyneuropathy in diseases classified elsewhere: Secondary | ICD-10-CM

## 2018-04-09 DIAGNOSIS — D649 Anemia, unspecified: Secondary | ICD-10-CM | POA: Diagnosis not present

## 2018-04-09 DIAGNOSIS — Z23 Encounter for immunization: Secondary | ICD-10-CM | POA: Diagnosis not present

## 2018-04-09 DIAGNOSIS — R51 Headache: Secondary | ICD-10-CM | POA: Diagnosis not present

## 2018-04-09 DIAGNOSIS — G8929 Other chronic pain: Secondary | ICD-10-CM

## 2018-04-09 DIAGNOSIS — R946 Abnormal results of thyroid function studies: Secondary | ICD-10-CM

## 2018-04-09 DIAGNOSIS — F332 Major depressive disorder, recurrent severe without psychotic features: Secondary | ICD-10-CM | POA: Diagnosis not present

## 2018-04-09 DIAGNOSIS — K2100 Gastro-esophageal reflux disease with esophagitis, without bleeding: Secondary | ICD-10-CM

## 2018-04-09 DIAGNOSIS — I10 Essential (primary) hypertension: Secondary | ICD-10-CM | POA: Diagnosis not present

## 2018-04-09 DIAGNOSIS — R609 Edema, unspecified: Secondary | ICD-10-CM | POA: Diagnosis not present

## 2018-04-09 DIAGNOSIS — E785 Hyperlipidemia, unspecified: Secondary | ICD-10-CM

## 2018-04-09 DIAGNOSIS — E782 Mixed hyperlipidemia: Secondary | ICD-10-CM

## 2018-04-09 DIAGNOSIS — R0609 Other forms of dyspnea: Secondary | ICD-10-CM

## 2018-04-09 MED ORDER — SUMATRIPTAN SUCCINATE 50 MG PO TABS: ORAL_TABLET | ORAL | 0 refills | Status: DC

## 2018-04-09 NOTE — Patient Instructions (Addendum)
Take amlodipine at bedtime.  Lipid panel indicates elevated triglyceride with normal direct LDL. This is due to non fasting state. Normal thyroid function, folic acid and T55,  stable hgbA1c at 6.3. This is considered prediabetes. Need to return for CBC draw. Entered referral to cardiology for further evaluation of dyspnea on exertion. Use compression stocking during the day and off at night (LE edema) Maintain DASH diet. F/up in 79months with me.  Maintain appt with neurology.  Sumatriptan tablets What is this medicine? SUMATRIPTAN (soo ma TRIP tan) is used to treat migraines with or without aura. An aura is a strange feeling or visual disturbance that warns you of an attack. It is not used to prevent migraines. This medicine may be used for other purposes; ask your health care provider or pharmacist if you have questions. COMMON BRAND NAME(S): Imitrex, Migraine Pack What should I tell my health care provider before I take this medicine? They need to know if you have any of these conditions: -circulation problems in fingers and toes -diabetes -heart disease -high blood pressure -high cholesterol -history of irregular heartbeat -history of stroke -kidney disease -liver disease -postmenopausal or surgical removal of uterus and ovaries -seizures -smoke tobacco -stomach or intestine problems -an unusual or allergic reaction to sumatriptan, other medicines, foods, dyes, or preservatives -pregnant or trying to get pregnant -breast-feeding How should I use this medicine? Take this medicine by mouth with a glass of water. Follow the directions on the prescription label. This medicine is taken at the first symptoms of a migraine. It is not for everyday use. If your migraine headache returns after one dose, you can take another dose as directed. You must leave at least 2 hours between doses, and do not take more than 100 mg as a single dose. Do not take more than 200 mg total in any 24  hour period. If there is no improvement at all after the first dose, do not take a second dose without talking to your doctor or health care professional. Do not take your medicine more often than directed. Talk to your pediatrician regarding the use of this medicine in children. Special care may be needed. Overdosage: If you think you have taken too much of this medicine contact a poison control center or emergency room at once. NOTE: This medicine is only for you. Do not share this medicine with others. What if I miss a dose? This does not apply; this medicine is not for regular use. What may interact with this medicine? Do not take this medicine with any of the following medicines: -cocaine -ergot alkaloids like dihydroergotamine, ergonovine, ergotamine, methylergonovine -feverfew -MAOIs like Carbex, Eldepryl, Marplan, Nardil, and Parnate -other medicines for migraine headache like almotriptan, eletriptan, frovatriptan, naratriptan, rizatriptan, zolmitriptan -tryptophan This medicine may also interact with the following medications: -certain medicines for depression, anxiety, or psychotic disturbances This list may not describe all possible interactions. Give your health care provider a list of all the medicines, herbs, non-prescription drugs, or dietary supplements you use. Also tell them if you smoke, drink alcohol, or use illegal drugs. Some items may interact with your medicine. What should I watch for while using this medicine? Only take this medicine for a migraine headache. Take it if you get warning symptoms or at the start of a migraine attack. It is not for regular use to prevent migraine attacks. You may get drowsy or dizzy. Do not drive, use machinery, or do anything that needs mental alertness until you know how  this medicine affects you. To reduce dizzy or fainting spells, do not sit or stand up quickly, especially if you are an older patient. Alcohol can increase drowsiness,  dizziness and flushing. Avoid alcoholic drinks. Smoking cigarettes may increase the risk of heart-related side effects from using this medicine. If you take migraine medicines for 10 or more days a month, your migraines may get worse. Keep a diary of headache days and medicine use. Contact your healthcare professional if your migraine attacks occur more frequently. What side effects may I notice from receiving this medicine? Side effects that you should report to your doctor or health care professional as soon as possible: -allergic reactions like skin rash, itching or hives, swelling of the face, lips, or tongue -bloody or watery diarrhea -hallucination, loss of contact with reality -pain, tingling, numbness in the face, hands, or feet -seizures -signs and symptoms of a blood clot such as breathing problems; changes in vision; chest pain; severe, sudden headache; pain, swelling, warmth in the leg; trouble speaking; sudden numbness or weakness of the face, arm, or leg -signs and symptoms of a dangerous change in heartbeat or heart rhythm like chest pain; dizziness; fast or irregular heartbeat; palpitations, feeling faint or lightheaded; falls; breathing problems -signs and symptoms of a stroke like changes in vision; confusion; trouble speaking or understanding; severe headaches; sudden numbness or weakness of the face, arm, or leg; trouble walking; dizziness; loss of balance or coordination -stomach pain Side effects that usually do not require medical attention (report to your doctor or health care professional if they continue or are bothersome): -changes in taste -facial flushing -headache -muscle cramps -muscle pain -nausea, vomiting -weak or tired This list may not describe all possible side effects. Call your doctor for medical advice about side effects. You may report side effects to FDA at 1-800-FDA-1088. Where should I keep my medicine? Keep out of the reach of children. Store at  room temperature between 2 and 30 degrees C (36 and 86 degrees F). Throw away any unused medicine after the expiration date. NOTE: This sheet is a summary. It may not cover all possible information. If you have questions about this medicine, talk to your doctor, pharmacist, or health care provider.  2018 Elsevier/Gold Standard (2015-06-23 12:38:23)

## 2018-04-09 NOTE — Progress Notes (Signed)
Subjective:  Patient ID: [name], female    DOB: January 30, 1941  Age: 77 y.o. MRN: [id number]  CC: Follow-up (follow up/pt also SOB and stated she spirate couple times last week. flu shot and tdap? imatrex refill until 05/26/18) and Edema (right ankle swelling. going on for a while, left side swelling at time. )  Shortness of Breath  This is a recurrent problem. The current episode started more than 1 month ago. The problem occurs intermittently. The problem has been waxing and waning. Associated symptoms include leg swelling. Pertinent negatives include no chest pain, fever, headaches, leg pain, orthopnea, PND, sputum production or wheezing. The symptoms are aggravated by any activity. She has tried rest for the symptoms. The treatment provided significant relief. There is no history of allergies, asthma, chronic lung disease, COPD, DVT, a heart failure or pneumonia.  OSA: denies hx of sleep apnea, reports sleep study was normal in 2016. No tobacco use or second hand smoke exposure. Echocardiogram 2017: normal. Venous doppler 2017: negative.  Ankle edema: chronic, ongoing since 10/2017, persistent, worsening, no change with elevation, no PND, no weight gain BNP done 11/2017: normal.  Headache: chronic, reports 4episodes per month, each episode improves with 2tabs of imitrex. CT head 2018: no acute finding.  HTN: Stable with losartan, metoprolol and amlodipine. BP Readings from Last 3 Encounters:  04/09/18 138/82  11/06/17 132/80  11/06/17 132/80   Reviewed past Medical, Social and Family history today.  Outpatient Medications Prior to Visit  Medication Sig Dispense Refill  . acetaminophen (TYLENOL) 500 MG tablet Take 500 mg by mouth every 8 (eight) hours as needed for moderate pain. Not to exceed 3000mg  per day total (6 pills)    . aspirin EC 325 MG tablet Take 1 tablet (325 mg total) by mouth daily. 90 tablet 3  . cholecalciferol (VITAMIN D) 1000 units tablet Take 1,000 Units  by mouth daily.    . Liniments (SALONPAS EX) Apply topically as needed.    . methocarbamol (ROBAXIN) 500 MG tablet Take 1 tablet (500 mg total) by mouth every 12 (twelve) hours as needed for muscle spasms. 6 tablet 0  . pyridOXINE (VITAMIN B-6) 50 MG tablet Take 50 mg by mouth daily.    . sodium chloride (OCEAN) 0.65 % SOLN nasal spray Place 1 spray into both nostrils as needed for congestion.  0  . amLODipine (NORVASC) 10 MG tablet TAKE 1 TABLET EVERY DAY FOR BLOOD PRESSURE 30 tablet 0  . atorvastatin (LIPITOR) 40 MG tablet TAKE 1 TABLET DAILY AT 6 PM. 30 tablet 0  . gabapentin (NEURONTIN) 600 MG tablet Take 1 tablet (600 mg total) by mouth 3 (three) times daily. Need office visit for additional refills 360 tablet 0  . losartan (COZAAR) 100 MG tablet TAKE 1 TABLET EVERY DAY 30 tablet 0  . metoprolol succinate (TOPROL-XL) 100 MG 24 hr tablet TAKE 1 TABLET EVERY DAY, TAKE WITH 50 MG TABLET 30 tablet 0  . metoprolol succinate (TOPROL-XL) 50 MG 24 hr tablet TAKE 1 TABLET EVERY DAY ALONG WITH 100 MG TABLET 30 tablet 0  . pantoprazole (PROTONIX) 40 MG tablet TAKE 1 TABLET EVERY DAY 30 tablet 0  . sertraline (ZOLOFT) 50 MG tablet Take 1 tablet (50 mg total) by mouth daily. Need office visit for additional refills 90 tablet 0  . SUMAtriptan (IMITREX) 50 MG tablet TAKE 1 TABLET BY MOUTH AS NEEDED FOR MIGRAINES/HEADACHES. DO NOT EXCEED 2 TABS IN 24 HOURS 9 tablet 0   No facility-administered  medications prior to visit.     ROS See HPI  Objective:  BP 138/82   Pulse 76   Temp 98.7 F (37.1 C) (Oral)   Ht 5\' 5"  (1.651 m)   Wt 239 lb (108.4 kg)   SpO2 96%   BMI 39.77 kg/m   BP Readings from Last 3 Encounters:  04/09/18 138/82  11/06/17 132/80  11/06/17 132/80    Wt Readings from Last 3 Encounters:  04/09/18 239 lb (108.4 kg)  11/06/17 239 lb (108.4 kg)  11/06/17 239 lb (108.4 kg)    Physical Exam  Constitutional: She appears well-developed and well-nourished.  Neck: No JVD present.    Cardiovascular: Normal rate, regular rhythm, normal heart sounds and intact distal pulses.  Pulmonary/Chest: Effort normal and breath sounds normal.  Abdominal: Soft. Bowel sounds are normal.  Musculoskeletal: She exhibits edema. She exhibits no tenderness.  Skin: No erythema.  Psychiatric: She has a normal mood and affect. Her behavior is normal. Thought content normal.  Vitals reviewed.   Lab Results  Component Value Date   WBC 9.3 11/06/2017   HGB 14.1 11/06/2017   HCT 42.1 11/06/2017   PLT 274.0 11/06/2017   GLUCOSE 143 (H) 11/06/2017   CHOL 177 04/09/2018   TRIG 270.0 (H) 04/09/2018   HDL 53.30 04/09/2018   LDLDIRECT 97.0 04/09/2018   LDLCALC 50 07/02/2015   ALT 7 11/06/2017   AST 17 11/06/2017   NA 135 11/06/2017   K 3.5 11/06/2017   CL 101 11/06/2017   CREATININE 1.04 11/06/2017   BUN 18 11/06/2017   CO2 21 11/06/2017   TSH 2.11 04/09/2018   INR 1.13 07/02/2015   HGBA1C 6.3 04/09/2018    Dg Hip Unilat With Pelvis 2-3 Views Left  Result Date: 11/10/2016 CLINICAL DATA:  Left hip pain since a fall against a table 11/07/2016. Initial encounter. EXAM: DG HIP (WITH OR WITHOUT PELVIS) 2-3V LEFT COMPARISON:  CT abdomen and pelvis 07/27/2015. FINDINGS: The hips are located. No fracture. No focal bony lesion. 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 Lower lumbar spondylosis is noted. IMPRESSION: No acute abnormality. Electronically Signed   By: Inge Rise M.D.   On: 11/10/2016 09:33    Assessment & Plan:   [name] was seen today for follow-up and edema.  Diagnoses and all orders for this visit:  Essential hypertension -     Discontinue: amLODipine (NORVASC) 10 MG tablet; Take 1 tablet (10 mg total) by mouth at bedtime. -     Discontinue: losartan (COZAAR) 100 MG tablet; Take 1 tablet (100 mg total) by mouth daily. -     Discontinue: metoprolol succinate (TOPROL-XL) 50 MG 24 hr tablet; TAKE 1 TABLET EVERY  DAY ALONG WITH 100 MG TABLET -     Discontinue: metoprolol succinate (TOPROL-XL) 100 MG 24 hr tablet; TAKE 1 TABLET EVERY DAY, TAKE WITH 50 MG TABLET  Abnormal thyroid function test -     TSH -     T4, free -     T3, free  Edema, unspecified type  Severe episode of recurrent major depressive disorder, without psychotic features (HCC)  Chronic intractable headache, unspecified headache type -     SUMAtriptan (IMITREX) 50 MG tablet; TAKE 1 TABLET BY MOUTH AS NEEDED FOR MIGRAINES/HEADACHES. DO NOT EXCEED 2 TABS IN 24 HOURS  Mixed hyperlipidemia -     Lipid panel  Hyperglycemia -     Hemoglobin A1c  Anemia, unspecified type -  B12 and Folate Panel -     CBC w/Diff  Dyspnea on exertion -     Ambulatory referral to Cardiology  Need for influenza vaccination -     Flu vaccine HIGH DOSE PF (Fluzone High dose)  Need for Tdap vaccination -     Tdap vaccine greater than or equal to 7yo IM  Hyperlipidemia, unspecified hyperlipidemia type -     Discontinue: atorvastatin (LIPITOR) 40 MG tablet; Take 1 tablet (40 mg total) by mouth daily at 6 PM.  Gastroesophageal reflux disease with esophagitis -     Discontinue: pantoprazole (PROTONIX) 20 MG tablet; Take 1 tablet (20 mg total) by mouth daily.  Major depressive disorder, recurrent severe without psychotic features ([place]) -     Discontinue: sertraline (ZOLOFT) 50 MG tablet; Take 1 tablet (50 mg total) by mouth daily.  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  Other orders -     LDL cholesterol, direct   I have discontinued Shraddha L. Limes's gabapentin, sertraline, pantoprazole, metoprolol succinate, metoprolol succinate, amLODipine, losartan, and atorvastatin. I am also having her maintain her sodium chloride, cholecalciferol, pyridOXINE, Liniments (SALONPAS EX), acetaminophen, aspirin EC,  methocarbamol, and SUMAtriptan.  Meds ordered this encounter  Medications  . SUMAtriptan (IMITREX) 50 MG tablet    Sig: TAKE 1 TABLET BY MOUTH AS NEEDED FOR MIGRAINES/HEADACHES. DO NOT EXCEED 2 TABS IN 24 HOURS    Dispense:  9 tablet    Refill:  0    Need to see PCP for more refills.    Order Specific Question:   Supervising Provider    Answer:   Lucille Passy [[id number]]  . DISCONTD: amLODipine (NORVASC) 10 MG tablet    Sig: Take 1 tablet (10 mg total) by mouth at bedtime.    Dispense:  90 tablet    Refill:  1    Order Specific Question:   Supervising Provider    Answer:   MATTHEWS, CODY [[id number]]  . DISCONTD: atorvastatin (LIPITOR) 40 MG tablet    Sig: Take 1 tablet (40 mg total) by mouth daily at 6 PM.    Dispense:  90 tablet    Refill:  1    Order Specific Question:   Supervising Provider    Answer:   MATTHEWS, CODY [[id number]]  . DISCONTD: gabapentin (NEURONTIN) 600 MG tablet    Sig: Take 1 tablet (600 mg total) by mouth 3 (three) times daily.    Dispense:  360 tablet    Refill:  1    Order Specific Question:   Supervising Provider    Answer:   MATTHEWS, CODY [[id number]]  . DISCONTD: losartan (COZAAR) 100 MG tablet    Sig: Take 1 tablet (100 mg total) by mouth daily.    Dispense:  90 tablet    Refill:  1    Order Specific Question:   Supervising Provider    Answer:   MATTHEWS, CODY [[id number]]  . DISCONTD: metoprolol succinate (TOPROL-XL) 50 MG 24 hr tablet    Sig: TAKE 1 TABLET EVERY DAY ALONG WITH 100 MG TABLET    Dispense:  90 tablet    Refill:  1    Order Specific Question:   Supervising Provider    Answer:   MATTHEWS, CODY [[id number]]  . DISCONTD: metoprolol succinate (TOPROL-XL) 100 MG 24 hr tablet    Sig: TAKE 1 TABLET EVERY  DAY, TAKE WITH 50 MG TABLET    Dispense:  90 tablet    Refill:  1    Order Specific Question:   Supervising Provider    Answer:   MATTHEWS, CODY [[id number]]  . DISCONTD: pantoprazole (PROTONIX) 20 MG tablet    Sig: Take 1 tablet (20 mg total) by mouth daily.     Dispense:  90 tablet    Refill:  1    Order Specific Question:   Supervising Provider    Answer:   MATTHEWS, CODY [[id number]]  . DISCONTD: sertraline (ZOLOFT) 50 MG tablet    Sig: Take 1 tablet (50 mg total) by mouth daily.    Dispense:  90 tablet    Refill:  1    Order Specific Question:   Supervising Provider    Answer:   MATTHEWS, CODY [[id number]]    Follow-up: Return in about 3 months (around 07/10/2018) for SOB, edema and HTN (46mins).  Wilfred Lacy, NP

## 2018-04-10 LAB — T3, FREE: T3 FREE: 3.4 pg/mL (ref 2.3–4.2)

## 2018-04-10 LAB — LIPID PANEL
CHOLESTEROL: 177 mg/dL (ref 0–200)
HDL: 53.3 mg/dL (ref >39.00)
NonHDL: 123.66
TRIGLYCERIDES: 270 mg/dL — AB (ref 0.0–149.0)
Total CHOL/HDL Ratio: 3
VLDL: 54 mg/dL — AB (ref 0.0–40.0)

## 2018-04-10 LAB — B12 AND FOLATE PANEL
FOLATE: 7.8 ng/mL (ref >5.9)
Vitamin B-12: 254 pg/mL (ref 211–911)

## 2018-04-10 LAB — TSH: TSH: 2.11 u[IU]/mL (ref 0.35–4.50)

## 2018-04-10 LAB — LDL CHOLESTEROL, DIRECT: Direct LDL: 97 mg/dL

## 2018-04-10 LAB — HEMOGLOBIN A1C: Hgb A1c MFr Bld: 6.3 % (ref 4.6–6.5)

## 2018-04-10 LAB — T4, FREE: Free T4: 0.82 ng/dL (ref 0.60–1.60)

## 2018-04-11 ENCOUNTER — Encounter: Payer: Self-pay | Admitting: Nurse Practitioner

## 2018-04-11 ENCOUNTER — Telehealth: Payer: Self-pay | Admitting: Nurse Practitioner

## 2018-04-11 DIAGNOSIS — I10 Essential (primary) hypertension: Secondary | ICD-10-CM

## 2018-04-11 DIAGNOSIS — G63 Polyneuropathy in diseases classified elsewhere: Secondary | ICD-10-CM

## 2018-04-11 DIAGNOSIS — D649 Anemia, unspecified: Secondary | ICD-10-CM

## 2018-04-11 DIAGNOSIS — F332 Major depressive disorder, recurrent severe without psychotic features: Secondary | ICD-10-CM

## 2018-04-11 DIAGNOSIS — E785 Hyperlipidemia, unspecified: Secondary | ICD-10-CM

## 2018-04-11 DIAGNOSIS — K2100 Gastro-esophageal reflux disease with esophagitis, without bleeding: Secondary | ICD-10-CM

## 2018-04-11 MED ORDER — LOSARTAN POTASSIUM 100 MG PO TABS: 100.0000 mg | ORAL_TABLET | Freq: Every day | ORAL | 1 refills | Status: AC

## 2018-04-11 MED ORDER — ATORVASTATIN CALCIUM 40 MG PO TABS: 40.0000 mg | ORAL_TABLET | Freq: Every day | ORAL | 1 refills | Status: DC

## 2018-04-11 MED ORDER — METOPROLOL SUCCINATE ER 100 MG PO TB24: ORAL_TABLET | ORAL | 1 refills | Status: AC

## 2018-04-11 MED ORDER — PANTOPRAZOLE SODIUM 20 MG PO TBEC: 20.0000 mg | DELAYED_RELEASE_TABLET | Freq: Every day | ORAL | 1 refills | Status: AC

## 2018-04-11 MED ORDER — LOSARTAN POTASSIUM 100 MG PO TABS: 100.0000 mg | ORAL_TABLET | Freq: Every day | ORAL | 1 refills | Status: DC

## 2018-04-11 MED ORDER — AMLODIPINE BESYLATE 10 MG PO TABS: 10.0000 mg | ORAL_TABLET | Freq: Every day | ORAL | 1 refills | Status: DC

## 2018-04-11 MED ORDER — METOPROLOL SUCCINATE ER 50 MG PO TB24: ORAL_TABLET | ORAL | 1 refills | Status: DC

## 2018-04-11 MED ORDER — METOPROLOL SUCCINATE ER 100 MG PO TB24: ORAL_TABLET | ORAL | 1 refills | Status: DC

## 2018-04-11 MED ORDER — SERTRALINE HCL 50 MG PO TABS: 50.0000 mg | ORAL_TABLET | Freq: Every day | ORAL | 1 refills | Status: DC

## 2018-04-11 MED ORDER — GABAPENTIN 600 MG PO TABS: 600.0000 mg | ORAL_TABLET | Freq: Three times a day (TID) | ORAL | 1 refills | Status: DC

## 2018-04-11 MED ORDER — ATORVASTATIN CALCIUM 40 MG PO TABS: 40.0000 mg | ORAL_TABLET | Freq: Every day | ORAL | 1 refills | Status: AC

## 2018-04-11 MED ORDER — SERTRALINE HCL 50 MG PO TABS: 50.0000 mg | ORAL_TABLET | Freq: Every day | ORAL | 1 refills | Status: AC

## 2018-04-11 MED ORDER — PANTOPRAZOLE SODIUM 20 MG PO TBEC: 20.0000 mg | DELAYED_RELEASE_TABLET | Freq: Every day | ORAL | 1 refills | Status: DC

## 2018-04-11 MED ORDER — AMLODIPINE BESYLATE 10 MG PO TABS: 10.0000 mg | ORAL_TABLET | Freq: Every day | ORAL | 1 refills | Status: AC

## 2018-04-11 MED ORDER — METOPROLOL SUCCINATE ER 50 MG PO TB24: ORAL_TABLET | ORAL | 1 refills | Status: AC

## 2018-04-11 NOTE — Telephone Encounter (Signed)
Ok with me 

## 2018-04-11 NOTE — Telephone Encounter (Signed)
It may be awhile before I can get her in so please  transfer her to Dr. Bryan Lemma.  [name] a great doctor and we need to build her panels.

## 2018-04-11 NOTE — Telephone Encounter (Signed)
Transferring requested medication refills to [facility] per pt. request.

## 2018-04-11 NOTE — Telephone Encounter (Signed)
Copied from [place] [id number]. Topic: Quick Communication - Rx Refill/Question >> Apr 11, 2018  3:14 PM [place], [place] D wrote: Medication: Pt states the following Rxs were called in to [facility], [place]. Pt states they need to go through [facility]. Please advise.  amLODipine (NORVASC) 10 MG tablet  atorvastatin (LIPITOR) 40 MG tablet  gabapentin (NEURONTIN) 600 MG tablet  losartan (COZAAR) 100 MG tablet  metoprolol succinate (TOPROL-XL) 100 MG 24 hr tablet metoprolol succinate (TOPROL-XL) 50 MG 24 hr tablet pantoprazole (PROTONIX) 20 MG tablet  sertraline (ZOLOFT) 50 MG tablet  Has the patient contacted their pharmacy? Yes.   (Agent: If no, request that the patient contact the pharmacy for the refill.) (Agent: If yes, when and what did the pharmacy advise?)  Preferred Pharmacy (with phone number or street name): [facility], [place] [phone] (Phone) [phone] (Fax)    Agent: Please be advised that RX refills may take up to 3 business days. We ask that you follow-up with your pharmacy.

## 2018-04-11 NOTE — Telephone Encounter (Signed)
Pt request to transfer from [facility] to Dr. [name]. Please advise.   Pt stated she loves [facility] but she would like to see an MD.

## 2018-04-14 NOTE — Telephone Encounter (Signed)
Patient stated her mail order pharmacy [facility] did not receive the losartan (COZAAR) 100 MG tablet prescription medication.

## 2018-04-14 NOTE — Telephone Encounter (Signed)
Re-sent to pharmacy.

## 2018-05-05 ENCOUNTER — Telehealth: Payer: Self-pay | Admitting: Nurse Practitioner

## 2018-05-05 MED ORDER — GABAPENTIN 600 MG PO TABS: 600.0000 mg | ORAL_TABLET | Freq: Four times a day (QID) | ORAL | 0 refills | Status: AC

## 2018-05-05 NOTE — Telephone Encounter (Signed)
[facility] with correct directions of Gabapentin 600 mg take 1 tab 4 times daily.   [facility] rep- stated they are going to send [name] 90 tablet that they owe her from last refill and will put next refill on hold with new directions. They are going to contact the pt of this information.

## 2018-05-05 NOTE — Telephone Encounter (Signed)
Copied from [place] [id number]. Topic: Quick Communication - Rx Refill/Question >> May 05, 2018  9:20 AM Charlynn Court wrote: Medication: gabapentin (NEURONTIN) 600 MG tablet  Has the patient contacted their pharmacy? Yes.   (Agent: If no, request that the patient contact the pharmacy for the refill.) (Agent: If yes, when and what did the pharmacy advise?)  Preferred Pharmacy (with phone number or street name): [facility], [place] [phone] (Phone) [phone] (Fax)  Prescription says to take medication 3 times daily, was told to take 4 times daily, please advise      Agent: Please be advised that RX refills may take up to 3 business days. We ask that you follow-up with your pharmacy.

## 2018-05-05 NOTE — Telephone Encounter (Signed)
Per previous pharmacy call, "prescription says to take medication 3 times daily, was told to take 4 times daily, please advice"; this change not noted in office visit dated 04/09/18 ;spoke with Tenna Delaine; she will have [name]'s assist contact the pharmacy; will route to office for final disposition.

## 2018-05-08 ENCOUNTER — Telehealth: Payer: Self-pay | Admitting: Nurse Practitioner

## 2018-05-08 DIAGNOSIS — R51 Headache: Principal | ICD-10-CM

## 2018-05-08 DIAGNOSIS — G8929 Other chronic pain: Secondary | ICD-10-CM

## 2018-05-08 MED ORDER — SUMATRIPTAN SUCCINATE 50 MG PO TABS: ORAL_TABLET | ORAL | 0 refills | Status: DC

## 2018-05-08 NOTE — Telephone Encounter (Signed)
Pt is requesting refill for sumatriptan 50 mg. Last refill was 04/09/18 for 9 tablets. Last ov with [facility] was 11/ 11/2017 and pt has transfer appt with Dr. Leilani Able on 07/10/17 (seeing neurology on 05/26/18). Please advise.

## 2018-05-08 NOTE — Telephone Encounter (Signed)
Ok to sent?

## 2018-05-08 NOTE — Telephone Encounter (Signed)
Rx sent. Pt is aware.  °

## 2018-05-09 ENCOUNTER — Ambulatory Visit: Payer: Self-pay | Admitting: Nurse Practitioner

## 2018-05-26 ENCOUNTER — Encounter

## 2018-05-26 ENCOUNTER — Ambulatory Visit: Payer: Self-pay | Admitting: Neurology

## 2018-06-10 ENCOUNTER — Other Ambulatory Visit: Payer: Self-pay | Admitting: Nurse Practitioner

## 2018-06-10 DIAGNOSIS — R51 Headache: Principal | ICD-10-CM

## 2018-06-10 DIAGNOSIS — G8929 Other chronic pain: Secondary | ICD-10-CM

## 2018-06-10 DIAGNOSIS — R519 Headache, unspecified: Secondary | ICD-10-CM

## 2018-06-10 NOTE — Telephone Encounter (Signed)
Please advise, last ov with [facility] was 04/09/18, last refill was 05/08/18 for 9 tablets no refill. Scheduled transfer care with Dr. Leilani Able on 07/10/2018.

## 2018-06-17 ENCOUNTER — Emergency Department (HOSPITAL_COMMUNITY): Payer: Medicare HMO

## 2018-06-17 ENCOUNTER — Emergency Department (HOSPITAL_COMMUNITY)
Admission: EM | Admit: 2018-06-17 | Discharge: 2018-06-20 | Disposition: A | Discharge status: Home / Self Care | Payer: Medicare HMO | Attending: Emergency Medicine | Admitting: Emergency Medicine

## 2018-06-17 ENCOUNTER — Other Ambulatory Visit: Payer: Self-pay

## 2018-06-17 ENCOUNTER — Encounter (HOSPITAL_COMMUNITY): Payer: Self-pay

## 2018-06-17 DIAGNOSIS — Y939 Activity, unspecified: Secondary | ICD-10-CM | POA: Insufficient documentation

## 2018-06-17 DIAGNOSIS — Y999 Unspecified external cause status: Secondary | ICD-10-CM | POA: Diagnosis not present

## 2018-06-17 DIAGNOSIS — S39012A Strain of muscle, fascia and tendon of lower back, initial encounter: Secondary | ICD-10-CM | POA: Diagnosis not present

## 2018-06-17 DIAGNOSIS — Z8673 Personal history of transient ischemic attack (TIA), and cerebral infarction without residual deficits: Secondary | ICD-10-CM | POA: Diagnosis not present

## 2018-06-17 DIAGNOSIS — M542 Cervicalgia: Secondary | ICD-10-CM | POA: Diagnosis not present

## 2018-06-17 DIAGNOSIS — S3992XA Unspecified injury of lower back, initial encounter: Secondary | ICD-10-CM | POA: Diagnosis present

## 2018-06-17 DIAGNOSIS — S199XXA Unspecified injury of neck, initial encounter: Secondary | ICD-10-CM | POA: Diagnosis not present

## 2018-06-17 DIAGNOSIS — I10 Essential (primary) hypertension: Secondary | ICD-10-CM | POA: Diagnosis not present

## 2018-06-17 DIAGNOSIS — R51 Headache: Secondary | ICD-10-CM | POA: Insufficient documentation

## 2018-06-17 DIAGNOSIS — Y92009 Unspecified place in unspecified non-institutional (private) residence as the place of occurrence of the external cause: Secondary | ICD-10-CM | POA: Diagnosis not present

## 2018-06-17 DIAGNOSIS — Z79899 Other long term (current) drug therapy: Secondary | ICD-10-CM | POA: Diagnosis not present

## 2018-06-17 DIAGNOSIS — M5489 Other dorsalgia: Secondary | ICD-10-CM | POA: Diagnosis not present

## 2018-06-17 DIAGNOSIS — R270 Ataxia, unspecified: Secondary | ICD-10-CM | POA: Diagnosis not present

## 2018-06-17 DIAGNOSIS — N289 Disorder of kidney and ureter, unspecified: Secondary | ICD-10-CM | POA: Diagnosis not present

## 2018-06-17 DIAGNOSIS — S0990XA Unspecified injury of head, initial encounter: Secondary | ICD-10-CM | POA: Diagnosis not present

## 2018-06-17 DIAGNOSIS — Z96653 Presence of artificial knee joint, bilateral: Secondary | ICD-10-CM | POA: Diagnosis not present

## 2018-06-17 DIAGNOSIS — E876 Hypokalemia: Secondary | ICD-10-CM | POA: Diagnosis not present

## 2018-06-17 DIAGNOSIS — W07XXXA Fall from chair, initial encounter: Secondary | ICD-10-CM | POA: Insufficient documentation

## 2018-06-17 DIAGNOSIS — R296 Repeated falls: Secondary | ICD-10-CM

## 2018-06-17 DIAGNOSIS — S299XXA Unspecified injury of thorax, initial encounter: Secondary | ICD-10-CM | POA: Diagnosis not present

## 2018-06-17 DIAGNOSIS — Z7982 Long term (current) use of aspirin: Secondary | ICD-10-CM | POA: Diagnosis not present

## 2018-06-17 DIAGNOSIS — R0789 Other chest pain: Secondary | ICD-10-CM

## 2018-06-17 DIAGNOSIS — W19XXXA Unspecified fall, initial encounter: Secondary | ICD-10-CM

## 2018-06-17 HISTORY — DX: Transient cerebral ischemic attack, unspecified: G45.9

## 2018-06-17 LAB — COMPREHENSIVE METABOLIC PANEL
ALT: 19 U/L (ref 0–44)
AST: 38 U/L (ref 15–41)
Albumin: 3.6 g/dL (ref 3.5–5.0)
Alkaline Phosphatase: 79 U/L (ref 38–126)
Anion gap: 11 (ref 5–15)
BUN: 28 mg/dL — ABNORMAL HIGH (ref 8–23)
CHLORIDE: 105 mmol/L (ref 98–111)
CO2: 19 mmol/L — ABNORMAL LOW (ref 22–32)
Calcium: 9 mg/dL (ref 8.9–10.3)
Creatinine, Ser: 1.35 mg/dL — ABNORMAL HIGH (ref 0.44–1.00)
GFR calc Af Amer: 44 mL/min — ABNORMAL LOW (ref >60)
GFR calc non Af Amer: 38 mL/min — ABNORMAL LOW (ref >60)
Glucose, Bld: 139 mg/dL — ABNORMAL HIGH (ref 70–99)
Potassium: 3.1 mmol/L — ABNORMAL LOW (ref 3.5–5.1)
Sodium: 135 mmol/L (ref 135–145)
Total Bilirubin: 0.8 mg/dL (ref 0.3–1.2)
Total Protein: 7.2 g/dL (ref 6.5–8.1)

## 2018-06-17 LAB — CBC WITH DIFFERENTIAL/PLATELET
Abs Immature Granulocytes: 0.03 10*3/uL (ref 0.00–0.07)
Basophils Absolute: 0.1 10*3/uL (ref 0.0–0.1)
Basophils Relative: 1 %
Eosinophils Absolute: 0.2 10*3/uL (ref 0.0–0.5)
Eosinophils Relative: 2 %
HCT: 45.2 % (ref 36.0–46.0)
Hemoglobin: 14.2 g/dL (ref 12.0–15.0)
Immature Granulocytes: 0 %
LYMPHS PCT: 38 %
Lymphs Abs: 2.6 10*3/uL (ref 0.7–4.0)
MCH: 36.1 pg — ABNORMAL HIGH (ref 26.0–34.0)
MCHC: 31.4 g/dL (ref 30.0–36.0)
MCV: 115 fL — ABNORMAL HIGH (ref 80.0–100.0)
Monocytes Absolute: 0.4 10*3/uL (ref 0.1–1.0)
Monocytes Relative: 6 %
Neutro Abs: 3.6 10*3/uL (ref 1.7–7.7)
Neutrophils Relative %: 53 %
Platelets: 283 10*3/uL (ref 150–400)
RBC: 3.93 MIL/uL (ref 3.87–5.11)
RDW: 14.7 % (ref 11.5–15.5)
WBC: 6.9 10*3/uL (ref 4.0–10.5)
nRBC: 0 % (ref 0.0–0.2)

## 2018-06-17 MED ORDER — AMLODIPINE BESYLATE 5 MG PO TABS
10.0000 mg | ORAL_TABLET | Freq: Every day | ORAL | Status: DC
  Administered 2018-06-17 – 2018-06-19 (×2): 10 mg via ORAL
  Filled 2018-06-17 (×4): qty 2

## 2018-06-17 MED ORDER — ASPIRIN EC 325 MG PO TBEC
325.0000 mg | DELAYED_RELEASE_TABLET | Freq: Every day | ORAL | Status: DC
  Administered 2018-06-18 – 2018-06-20 (×3): 325 mg via ORAL
  Filled 2018-06-17 (×3): qty 1

## 2018-06-17 MED ORDER — METOPROLOL SUCCINATE ER 50 MG PO TB24
150.0000 mg | ORAL_TABLET | Freq: Every day | ORAL | Status: DC
  Administered 2018-06-18 – 2018-06-20 (×3): 150 mg via ORAL
  Filled 2018-06-17 (×3): qty 1

## 2018-06-17 MED ORDER — GABAPENTIN 300 MG PO CAPS
600.0000 mg | ORAL_CAPSULE | Freq: Once | ORAL | Status: AC
  Administered 2018-06-17: 600 mg via ORAL
  Filled 2018-06-17: qty 2

## 2018-06-17 MED ORDER — POTASSIUM CHLORIDE 10 MEQ/100ML IV SOLN
10.0000 meq | Freq: Once | INTRAVENOUS | Status: AC
  Administered 2018-06-17: 10 meq via INTRAVENOUS
  Filled 2018-06-17: qty 100

## 2018-06-17 MED ORDER — GABAPENTIN 300 MG PO CAPS
600.0000 mg | ORAL_CAPSULE | Freq: Four times a day (QID) | ORAL | Status: DC
  Administered 2018-06-17 – 2018-06-20 (×11): 600 mg via ORAL
  Filled 2018-06-17 (×11): qty 2

## 2018-06-17 MED ORDER — ACETAMINOPHEN 325 MG PO TABS
650.0000 mg | ORAL_TABLET | Freq: Once | ORAL | Status: AC
  Administered 2018-06-17: 650 mg via ORAL
  Filled 2018-06-17: qty 2

## 2018-06-17 MED ORDER — SODIUM CHLORIDE 0.9 % IV BOLUS
1000.0000 mL | Freq: Once | INTRAVENOUS | Status: AC
  Administered 2018-06-17: 1000 mL via INTRAVENOUS

## 2018-06-17 MED ORDER — SERTRALINE HCL 50 MG PO TABS
50.0000 mg | ORAL_TABLET | Freq: Every day | ORAL | Status: DC
  Administered 2018-06-18 – 2018-06-20 (×3): 50 mg via ORAL
  Filled 2018-06-17 (×3): qty 1

## 2018-06-17 MED ORDER — ATORVASTATIN CALCIUM 40 MG PO TABS
40.0000 mg | ORAL_TABLET | Freq: Every day | ORAL | Status: DC
  Administered 2018-06-18 – 2018-06-19 (×2): 40 mg via ORAL
  Filled 2018-06-17 (×4): qty 1

## 2018-06-17 MED ORDER — VITAMIN B-6 50 MG PO TABS
50.0000 mg | ORAL_TABLET | Freq: Every day | ORAL | Status: DC
  Administered 2018-06-18 – 2018-06-20 (×3): 50 mg via ORAL
  Filled 2018-06-17 (×3): qty 1

## 2018-06-17 NOTE — Discharge Instructions (Addendum)
We saw you in the ER after you had a fall. °All the imaging results are normal, no fractures seen. No evidence of brain bleed. °Please be very careful with walking, and do everything possible to prevent falls. ° ° °

## 2018-06-17 NOTE — ED Notes (Signed)
Blood draw attempt unsuccessful. RN notified. 

## 2018-06-17 NOTE — ED Notes (Signed)
IV team at bedside 

## 2018-06-17 NOTE — ED Notes (Signed)
Bed: [facility] Expected date:  Expected time:  Means of arrival:  Comments: EMS 77yo 2 falls, back/neck pain

## 2018-06-17 NOTE — ED Notes (Signed)
Pt was able to sit up and get in standing position unassisted. Pt was able to walk 3 steps and stated that she could not walk any further Pt used a walker during ambulation. Pt was assisted back in to bed. Pt son remains at bedside.

## 2018-06-17 NOTE — Progress Notes (Addendum)
CSW met with [name] and patients son, [name], via bedside to discuss disposition plans. [name] currently has labs pending and has not yet been medically cleared. [name] states she has suffered from depression for the past eight years since spouse passed away and has struggled with addiction in the past.   [name] stated she has fallen 3 times in the past 24 hours and has difficultly taking care of herself. [name] directed further questions to son, [name]. [name] stated he was patients POA and "therefore she will not be leaving the hospital". CSW attempted to explain difference of POA/ legal guardian ship to [name] however he kept repeating "I just spoke to my lawyer and she said I can make all her decisions".   CSW questioned [name] where she would like to go in the event she is medically cleared from the ED- [name] stated an assisted living. [name] then stated she has no money to pay for it and does not currently have Medicaid. Son, [name], then stated [name] would have to wait until she could get Medicaid and proceeded to say "you cant just kick her out of here. I know this because I just went through this with my sister". Per notes, EMS made APS report- [name] provided CSW with contact information to [place], [phone]. CSW contacted the after hours APS number and was directed to contact [name] at her office number [phone]- CSW left voicemail for her to return call in the AM. On call APS worker had no information for CSW at this time.   PLAN: CSW awaiting to see if [name] is medically cleared for discharged. In the event [name] is medically cleared- will follow up with EDP regarding next steps.    Isabella Spittle, LCSW Clinical Social Worker  [facility]  [phone]

## 2018-06-17 NOTE — ED Provider Notes (Signed)
  Physical Exam  BP 119/70 (BP Location: Left Arm)   Pulse 76   Temp 97.9 F (36.6 C) (Oral)   Resp 18   SpO2 96%   Physical Exam  ED Course/Procedures   Clinical Course as of Jun 17 2056  Tue Jun 17, 2018  1536 I called both numbers on patient's demographics, unfortunately there was no response.  Social work has been consulted along with case management.   [AN]    Clinical Course User Index [AN] Varney Biles, MD    Procedures  MDM  Received patient in consult.  Creatinine mildly increased and mild hypokalemia.  Will supplement.  Likely has some dehydration.  Had seen social work and they stated would be difficult to get her placed into an assisted living with her insurance.  However patient at this point states she feels dizzy and will not ambulate more than a few steps.  States that her son think she needs to be in an assisted living.  Will get PT consult in the morning.  APS reportedly is following, however there is no note and when the social work called the on-call they did not know anything about her.  Reevaluate in morning.      Davonna Belling, MD 06/17/18 [phone]

## 2018-06-17 NOTE — ED Notes (Signed)
Patient transported to radiology

## 2018-06-17 NOTE — ED Triage Notes (Signed)
Pt arrived  Via  EMS from home. EMS reports that they have been called out out to home 5 x in the past 24 hours. Pt reports that she slide out of her recliner. Pt has been c/o generalized back pain. Pt  was accompanied by APS in which EMS called as per pt living conditions are inhabitable, and are in the process of condemning home. Requesting SW and pt son is en route.    V/s 136/82, RR 16, HR 72,

## 2018-06-17 NOTE — ED Provider Notes (Addendum)
[facility] DEPT Provider Note   CSN: [id number] Arrival date & time: 06/17/18  1137     History   Chief Complaint Chief Complaint  Patient presents with  . Fall    HPI [name] is a 78 y.o. female.  HPI 78 year old female with multiple medical comorbidities comes in with chief complaint of fall. Patient comes from her home via EMS.  Patient allegedly had a fall at home.  According to EMS, patient's home was inhabitable and it appears that patient might be evicted from the house. Patient states that she has history of multiple falls because of balance issues.  She had a fall earlier in the day when she slid out of her recliner.  [name] complaining of neck pain and back pain.  She has history of C-spine surgery.  Pt has no associated nausea, vomiting, seizures, loss of consciousness or new visual complains, weakness, numbness, dizziness or gait instability.   Past Medical History:  Diagnosis Date  . Anxiety   . Arthritis    djd  . Blood transfusion without reported diagnosis   . Cataracts, bilateral    immature   . Cellulitis    10/11/11 hospitalized for cellulitis   . Chicken pox   . Chronic back pain    scoliosis/stenosis  . Depression    takes Zoloft daily  . Depression   . Dysrhythmia    hx tachy palpitations  . Elbow fracture, left April 2015  . GERD (gastroesophageal reflux disease)    hx pud '98  . Glaucoma   . Headache(784.0)    migraines yrs ago  . History of bronchitis    many yrs ago  . Hyperlipemia    takes Atorvastatin daily  . Hypertension    takes HCTZ daily  . Insomnia   . Joint pain   . Joint swelling   . Migraine   . Neuromuscular disorder ([place])    peripheral neuropathy, FEET AND LEGS  . Peripheral neuropathy   . Peripheral neuropathy    takes Gabapentin daily  . Positive TB test   . 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 Radiculopathy, lumbar region   . Restless leg syndrome    takes  Sinemet daily  . Stroke [facility])    ? 6 months ago - tests okay - [facility]   . Vertigo     Patient Active Problem List   Diagnosis Date Noted  . Edema 11/06/2017  . Uncomplicated opioid dependence ([place]) 07/20/2016  . Rotator cuff tear arthropathy of right shoulder 07/20/2016  . Nontraumatic incomplete tear of right rotator cuff 07/17/2016  . Acute pain of right shoulder 06/27/2016  . Impingement syndrome of right shoulder 06/11/2016  . Colon cancer screening 05/17/2016  . Heme positive stool 05/17/2016  . Esophageal ulcer with bleeding 05/17/2016  . Duodenal ulcer 05/17/2016  . Herniated nucleus pulposus, L2-3 right 12/19/2015  . Right foot pain 07/22/2015  . Chest pain 07/11/2015  . History of drug overdose 07/07/2015  . Frequent falls 07/07/2015  . Hyperlipidemia 07/07/2015  . Essential hypertension 07/07/2015  . Spinal stenosis, lumbar region, with neurogenic claudication 07/07/2015  . Severe episode of recurrent major depressive disorder, with psychotic features ([place]) 07/07/2015  . Visual hallucination 07/01/2015  . Severe episode of recurrent major depressive disorder, without psychotic features ([place])   . Back pain 06/20/2015  . MDD (major depressive disorder), recurrent episode, severe ([place]) 06/16/2015  . Lumbar stenosis 04/19/2015  .  Constipation 09/19/2014  . Acute respiratory failure ([place]) 09/17/2014  . OSA (obstructive sleep apnea) 09/17/2014  . Ankle fracture, lateral malleolus, closed 09/14/2014  . Major depressive disorder, recurrent severe without psychotic features ([place]) 08/07/2014  . Insomnia 08/05/2014  . Hip joint painful on movement 08/05/2014  . Degenerative disc disease, lumbar 08/05/2014  . Severe obesity (BMI >= 40) ([place]) 01/11/2014  . Depression   . Arthritis   . Polyneuropathy in other diseases classified elsewhere ([place]) 04/08/2013  . Restless legs syndrome (RLS) 04/08/2013  . UTI (lower urinary tract infection) 10/14/2011  . Dizziness 10/12/2011  .  TIA (transient ischemic attack) 10/12/2011  . GERD (gastroesophageal reflux disease) 10/12/2011    Past Surgical History:  Procedure Laterality Date  . ABDOMINAL HYSTERECTOMY  1975   bil oophorectomy  . APPENDECTOMY    . BACK SURGERY   MANY YRS AGO   laminectomy x3  . CARPAL TUNNEL RELEASE  07/09/2012   Procedure: CARPAL TUNNEL RELEASE;  Surgeon: Magnus Sinning, MD;  Location: [facility];  Service: Orthopedics;  Laterality: Left;  . cervical disc surgery x2  2011  . CHOLECYSTECTOMY  1993  . colonosocpy    . ESOPHAGOGASTRODUODENOSCOPY    . FINGER ARTHROPLASTY  07/09/2012   Procedure: FINGER ARTHROPLASTY;  Surgeon: Magnus Sinning, MD;  Location: [facility];  Service: Orthopedics;  Laterality: Left;  Interposition Arthroplasty CMC Joint Thumb Left   . HEMATOMA EVACUATION  2011   s/p rt cea  . JOINT REPLACEMENT  '01   total knee replacement, LEFT  . left knee arthroscopy  2001  . LIPOMA EXCISION  07/09/2012   Procedure: EXCISION LIPOMA;  Surgeon: Magnus Sinning, MD;  Location: [facility];  Service: Orthopedics;  Laterality: Left;  Excision Lipoma Dorsum Left Wrist   . LUMBAR LAMINECTOMY WITH COFLEX 1 LEVEL Bilateral 04/19/2015   Procedure: LUMBAR TWO-THREE LUMBAR LAMINECTOMY WITH COFLEX ;  Surgeon: Kristeen Miss, MD;  Location: [facility];  Service: Neurosurgery;  Laterality: Bilateral;  Bilateral L23 laminectomy and foraminotomy with coflex  . LUMBAR LAMINECTOMY/DECOMPRESSION MICRODISCECTOMY Right 12/19/2015   Procedure: Right Lumbar two-three  Microdiskectomy;  Surgeon: Kristeen Miss, MD;  Location: [facility];  Service: Neurosurgery;  Laterality: Right;  . ORIF ANKLE FRACTURE Right 09/14/2014   FIBULA   . ORIF ANKLE FRACTURE Right 09/14/2014   Procedure: OPEN REDUCTION INTERNAL FIXATION (ORIF) RIGHT ANKLE FRACTURE/SYNDESMOSIS ;  Surgeon: Wylene Simmer, MD;  Location: [facility];  Service: Orthopedics;  Laterality: Right;  . right knee replacement     09/2011   . rt carotid enarterectomy  2011  .  rt knee arthroscopy  '99  . SHOULDER ARTHROSCOPY Right   . TOTAL KNEE ARTHROPLASTY  09/13/2011   Procedure: TOTAL KNEE ARTHROPLASTY;  Surgeon: Magnus Sinning, MD;  Location: [facility];  Service: Orthopedics;  Laterality: Right;     OB History   No obstetric history on file.      Home Medications    Prior to Admission medications   Medication Sig Start Date End Date Taking? Authorizing Provider  acetaminophen (TYLENOL) 650 MG CR tablet Take 1,300 mg by mouth 2 (two) times daily as needed for pain.   Yes [provider]  amLODipine (NORVASC) 10 MG tablet Take 1 tablet (10 mg total) by mouth at bedtime. 04/11/18  Yes Nche, Charlene Brooke, NP  aspirin EC 325 MG tablet Take 1 tablet (325 mg total) by mouth daily. 07/20/16  Yes [name], [name] L, DO  atorvastatin (LIPITOR) 40 MG  tablet Take 1 tablet (40 mg total) by mouth daily at 6 PM. 04/11/18  Yes Nche, Charlene Brooke, NP  cholecalciferol (VITAMIN D) 1000 units tablet Take 1,000 Units by mouth daily.   Yes [provider]  cyclobenzaprine (FLEXERIL) 10 MG tablet Take 10 mg by mouth 3 (three) times daily as needed for muscle spasms. 06/04/18  Yes [provider]  gabapentin (NEURONTIN) 600 MG tablet Take 1 tablet (600 mg total) by mouth 4 (four) times daily. Patient taking differently: Take 600 mg by mouth 4 (four) times daily. With meals and at bedtime 05/05/18  Yes Nche, Charlene Brooke, NP  losartan (COZAAR) 100 MG tablet Take 1 tablet (100 mg total) by mouth daily. 04/11/18  Yes Nche, Charlene Brooke, NP  metoprolol succinate (TOPROL-XL) 100 MG 24 hr tablet TAKE 1 TABLET EVERY DAY, TAKE WITH 50 MG TABLET 04/11/18  Yes Nche, Charlene Brooke, NP  metoprolol succinate (TOPROL-XL) 50 MG 24 hr tablet TAKE 1 TABLET EVERY DAY ALONG WITH 100 MG TABLET 04/11/18  Yes Nche, Charlene Brooke, NP  pyridOXINE (VITAMIN B-6) 50 MG tablet Take 50 mg by mouth daily.   Yes [provider]  sertraline (ZOLOFT) 50 MG tablet Take 1 tablet (50 mg  total) by mouth daily. 04/11/18  Yes Nche, Charlene Brooke, NP  sodium chloride (OCEAN) 0.65 % SOLN nasal spray Place 1 spray into both nostrils as needed for congestion. 06/28/15  Yes N[place]oko, Herbert Pun I, NP  SUMAtriptan (IMITREX) 50 MG tablet TAKE 1 TABLET BY MOUTH AS NEEDED FOR MIGRAINES/HEADACHES. DO NOT EXCEED 2 TABLETS IN 24 HOURS 06/10/18  Yes Nche, Charlene Brooke, NP  methocarbamol (ROBAXIN) 500 MG tablet Take 1 tablet (500 mg total) by mouth every 12 (twelve) hours as needed for muscle spasms. Patient not taking: Reported on 06/17/2018 12/23/17   Nche, Charlene Brooke, NP  pantoprazole (PROTONIX) 20 MG tablet Take 1 tablet (20 mg total) by mouth daily. Patient not taking: Reported on 06/17/2018 04/11/18   Nche, Charlene Brooke, NP  potassium chloride 20 MEQ TBCR Take 20 mEq by mouth daily. 08/07/14 08/07/14  Larene Pickett, PA-C    Family History Family History  Problem Relation Age of Onset  . Congestive Heart Failure Father   . Arthritis Father   . Hearing loss Father   . Heart disease Father   . Congestive Heart Failure Sister   . Arthritis Sister   . COPD Sister   . Depression Sister   . Early death Sister   . Hypertension Sister   . Alzheimer's disease Mother   . Arthritis Mother   . Diabetes Brother   . Cancer Brother        adrenal gland  . Testicular cancer Brother   . Arthritis Brother   . Heart disease Brother   . Hyperlipidemia Brother   . Hypertension Brother   . Arthritis Brother   . Cancer Brother        prostate  . Diabetes Brother   . Prostate cancer Brother   . Hypertension Brother   . Early death Maternal Grandmother   . Early death Maternal Grandfather   . Early death Paternal Grandmother   . Early death Paternal Grandfather     Social History Social History   Tobacco Use  . Smoking status: Never Smoker  . Smokeless tobacco: Never Used  Substance Use Topics  . Alcohol use: No    Alcohol/week: 0.0 standard drinks  . Drug use: No     Allergies  Contrast  media [iodinated diagnostic agents]; Acyclovir and related; Famciclovir; Penicillins; and Sulfa drugs cross reactors   Review of Systems Review of Systems  Constitutional: Positive for activity change.  Respiratory: Negative for shortness of breath.   Cardiovascular: Negative for chest pain.  Musculoskeletal: Positive for arthralgias and myalgias.  Hematological: Does not bruise/bleed easily.     Physical Exam Updated Vital Signs There were no vitals taken for this visit.  Physical Exam Vitals signs and nursing note reviewed.  Constitutional:      Appearance: [name] well-developed.  HENT:     Head: Atraumatic.  Neck:     Musculoskeletal: No neck rigidity.     Comments: Midline C-spine tenderness Cardiovascular:     Rate and Rhythm: Normal rate.  Pulmonary:     Effort: Pulmonary effort is normal.  Abdominal:     General: Bowel sounds are normal.  Musculoskeletal:        General: No deformity.  Skin:    General: Skin is warm and dry.  Neurological:     Mental Status: [name] alert and oriented to person, place, and time.      ED Treatments / Results  Labs (all labs ordered are listed, but only abnormal results are displayed) Labs Reviewed - No data to display  EKG None  Radiology Ct Head Wo Contrast  Result Date: 06/17/2018 CLINICAL DATA:  Recent falls with headaches and neck pain, initial encounter EXAM: CT HEAD WITHOUT CONTRAST CT CERVICAL SPINE WITHOUT CONTRAST TECHNIQUE: Multidetector CT imaging of the head and cervical spine was performed following the standard protocol without intravenous contrast. Multiplanar CT image reconstructions of the cervical spine were also generated. COMPARISON:  06/17/2016 FINDINGS: CT HEAD FINDINGS Brain: No findings to suggest acute hemorrhage, acute infarction or space-occupying mass lesion are noted. Areas of decreased attenuation are noted within the subinsular cortex as well as the right thalamus consistent with prior lacunar  infarct. Vascular: No hyperdense vessel or unexpected calcification. Skull: Normal. Negative for fracture or focal lesion. Sinuses/Orbits: No acute finding. Other: None. CT CERVICAL SPINE FINDINGS Alignment: Within normal limits. Skull base and vertebrae: Postsurgical changes are noted at C5-6 and C6-7 with anterior fixation and interbody fusion. Facet hypertrophic changes are identified. Mild osteophytic changes are seen. No acute fracture or acute facet abnormality is noted. The odontoid is within normal limits. Soft tissues and spinal canal: No prevertebral fluid or swelling. No visible canal hematoma. Upper chest: Within normal limits. Other: None IMPRESSION: CT of the head: Chronic ischemic changes without acute abnormality. CT of the cervical spine: Postoperative and degenerative changes without acute abnormality. Electronically Signed   By: Inez Catalina M.D.   On: 06/17/2018 14:38   Ct Cervical Spine Wo Contrast  Result Date: 06/17/2018 CLINICAL DATA:  Recent falls with headaches and neck pain, initial encounter EXAM: CT HEAD WITHOUT CONTRAST CT CERVICAL SPINE WITHOUT CONTRAST TECHNIQUE: Multidetector CT imaging of the head and cervical spine was performed following the standard protocol without intravenous contrast. Multiplanar CT image reconstructions of the cervical spine were also generated. COMPARISON:  06/17/2016 FINDINGS: CT HEAD FINDINGS Brain: No findings to suggest acute hemorrhage, acute infarction or space-occupying mass lesion are noted. Areas of decreased attenuation are noted within the subinsular cortex as well as the right thalamus consistent with prior lacunar infarct. Vascular: No hyperdense vessel or unexpected calcification. Skull: Normal. Negative for fracture or focal lesion. Sinuses/Orbits: No acute finding. Other: None. CT CERVICAL SPINE FINDINGS Alignment: Within normal limits. Skull base and vertebrae:  Postsurgical changes are noted at C5-6 and C6-7 with anterior fixation and  interbody fusion. Facet hypertrophic changes are identified. Mild osteophytic changes are seen. No acute fracture or acute facet abnormality is noted. The odontoid is within normal limits. Soft tissues and spinal canal: No prevertebral fluid or swelling. No visible canal hematoma. Upper chest: Within normal limits. Other: None IMPRESSION: CT of the head: Chronic ischemic changes without acute abnormality. CT of the cervical spine: Postoperative and degenerative changes without acute abnormality. Electronically Signed   By: Inez Catalina M.D.   On: 06/17/2018 14:38    Procedures Procedures (including critical care time)  Medications Ordered in ED Medications  gabapentin (NEURONTIN) capsule 600 mg (600 mg Oral Given 06/17/18 1349)  acetaminophen (TYLENOL) tablet 650 mg (650 mg Oral Given 06/17/18 1350)     Initial Impression / Assessment and Plan / ED Course  I have reviewed the triage vital signs and the nursing notes.  Pertinent labs & imaging results that were available during my care of the patient were reviewed by me and considered in my medical decision making (see chart for details).  Clinical Course as of Jun 17 1534  Tue Jun 17, 2018  1536 I called both numbers on patient's demographics, unfortunately there was no response.  Social work has been consulted along with case management.   [AN]    Clinical Course User Index [AN] Varney Biles, MD    DDx includes: - Mechanical falls - ICH - Fractures - Contusions - Soft tissue injury  Patient comes in with chief complaint of fall.  She resides in her home independently.  It appears that over the past few days her balance has gotten worse and she is having multiple falls.  On exam she is noted to have no focal findings based on trauma.  We will have to get CT head and C-spine because we cannot clinically clear either of those systems.  Patient is amenable to getting home health. Face-to-face for home health has been  ordered.  4:14 PM Patient's son is at bedside now.  It appears that adult protective agency was called by EMS because patient's living situation looked extremely terrible.  Son informed me that he does not think patient can live on her home.  I explained to them that we will get some labs, however we do not have any medical reason to admit her at this time.  Social work has been consulted along with case management.  We will also put in a consult for PT OT.  Final Clinical Impressions(s) / ED Diagnoses   Final diagnoses:  Fall, initial encounter  Ataxia  Lumbar strain, initial encounter    ED Discharge Orders    None       Varney Biles, MD 06/17/18 [place], [place], MD 06/17/18 1614

## 2018-06-17 NOTE — ED Notes (Signed)
Waiting on MSW.

## 2018-06-18 ENCOUNTER — Encounter (HOSPITAL_COMMUNITY): Payer: Self-pay | Admitting: Physician Assistant

## 2018-06-18 ENCOUNTER — Telehealth: Payer: Self-pay | Admitting: Nurse Practitioner

## 2018-06-18 ENCOUNTER — Telehealth: Payer: Self-pay | Admitting: *Deleted

## 2018-06-18 DIAGNOSIS — R9431 Abnormal electrocardiogram [ECG] [EKG]: Secondary | ICD-10-CM

## 2018-06-18 DIAGNOSIS — R072 Precordial pain: Secondary | ICD-10-CM

## 2018-06-18 LAB — MAGNESIUM: Magnesium: 1.7 mg/dL (ref 1.7–2.4)

## 2018-06-18 LAB — TROPONIN I
Troponin I: <0.03 ng/mL
Troponin I: <0.03 ng/mL

## 2018-06-18 MED ORDER — HYDROCODONE-ACETAMINOPHEN 5-325 MG PO TABS
1.0000 | ORAL_TABLET | Freq: Four times a day (QID) | ORAL | Status: DC | PRN
  Administered 2018-06-18 – 2018-06-20 (×7): 1 via ORAL
  Filled 2018-06-18 (×7): qty 1

## 2018-06-18 MED ORDER — NITROGLYCERIN 2 % TD OINT
1.0000 [in_us] | TOPICAL_OINTMENT | Freq: Once | TRANSDERMAL | Status: AC
  Administered 2018-06-18: 1 [in_us] via TOPICAL
  Filled 2018-06-18: qty 1

## 2018-06-18 MED ORDER — ASPIRIN 81 MG PO CHEW
324.0000 mg | CHEWABLE_TABLET | Freq: Once | ORAL | Status: AC
  Administered 2018-06-18: 324 mg via ORAL
  Filled 2018-06-18: qty 4

## 2018-06-18 MED ORDER — LOSARTAN POTASSIUM 50 MG PO TABS: 100.0000 mg | ORAL_TABLET | Freq: Every day | ORAL | Status: DC

## 2018-06-18 MED ORDER — METOPROLOL SUCCINATE ER 50 MG PO TB24: 50.0000 mg | ORAL_TABLET | Freq: Every day | ORAL | Status: DC

## 2018-06-18 MED ORDER — SALINE SPRAY 0.65 % NA SOLN
1.0000 | NASAL | Status: DC | PRN
  Administered 2018-06-18 (×2): 1 via NASAL
  Filled 2018-06-18: qty 44

## 2018-06-18 MED ORDER — POTASSIUM CHLORIDE CRYS ER 20 MEQ PO TBCR
40.0000 meq | EXTENDED_RELEASE_TABLET | Freq: Once | ORAL | Status: AC
  Administered 2018-06-18: 40 meq via ORAL
  Filled 2018-06-18: qty 2

## 2018-06-18 NOTE — ED Provider Notes (Signed)
Patient has been cleared by cardiology for her complaint of chest pain.  I discussed the case multiple times with social worker and we are awaiting a PT consult at this time.  Based on the results of that the patient will be attempted to be placed into a SNF.   Lacretia Leigh, MD 06/18/18 1459

## 2018-06-18 NOTE — ED Notes (Signed)
Bed: WA04 Expected date:  Expected time:  Means of arrival:  Comments: 

## 2018-06-18 NOTE — Progress Notes (Signed)
FL-2/referrals sent with /pt'spt's son's verbal permission to [place] SNF's.  Pt's son initially stated [place] area SNF's only but CSW cautioned against this since pt will D/C likely home without placement if not found in the [place] only area SNF's.  Son then agreeable to [place] area and pt was agreeable also.  CSW will continue to follow for D/C needs.  [name]. Patty Lopezgarcia, LCSW, LCAS, CSI Clinical Social Worker Ph: [phone]

## 2018-06-18 NOTE — ED Provider Notes (Signed)
Nursing staff came to me and state "patient is still having chest pain".  This was not reported to me in checkout.  A EKG was done which is very similar to the EKG done around 7:30 this evening.  Troponin was added to her blood work.  3:50 AM patient states she started getting chest pain around dinnertime.  She states it is in the center of her chest and is a boring or pressure pain.  She has had chronic shortness of breath for a few months and is had nausea for the past few days.  She states she had similar pain before and was admitted to the hospital and had multiple heart testing done that were okay.  She has not had to see a cardiologist since that time.  She was evaluated by Dr. Gwenlyn Found at that time. Patient also requesting saline nasal spray because she has a stuffy nose.  7:15 AM patient states her pain is better.  We discussed getting a second troponin.  My concern is that she has new changes on her EKG.  I am going to talk to cardiology about her.  7:48 AM PA Phylliss Bob,, with cardiology will have PA done, evaluate patient.  7:50 AM the delta troponin was ordered to be done at 8:30 AM Dr. Zenia Resides made aware of patient.    EKG Interpretation  Date/Time:  Wednesday June 18 2018 02:35:36 EST Ventricular Rate:  85 PR Interval:    QRS Duration: 89 QT Interval:  369 QTC Calculation: 439 R Axis:   33 Text Interpretation:  Sinus rhythm Low voltage, precordial leads Abnormal T, consider ischemia, diffuse leads No significant change since last tracing about 7 hours before.  Confirmed by Rolland Porter [phone]) on 06/18/2018 3:04:02 AM      Please note the ST T wave changes are new since July 27, 2015  July 13, 2015 MYOCARDIAL IMAGING WITH SPECT (REST AND PHARMACOLOGIC-STRESS)  GATED LEFT VENTRICULAR WALL MOTION STUDY   IMPRESSION: 1. No reversible ischemia or infarction.  2. Normal left ventricular wall motion.  3. Left ventricular ejection fraction 70%  4. Low-risk stress  test findings*.  *2012 Appropriate Use Criteria for Coronary Revascularization Focused Update: J Am Coll Cardiol. 9450;38(8):828-003. http://content.airportbarriers.com.aspx?articleid=1201161   Electronically Signed   By: Suzy Bouchard M.D.   On: 07/13/2015 14:41   July 12, 2015 Echocardiogram Impressions:  - Normal LV size with EF 65-70%. Normal RV size and systolic   function. No significant valvular abnormalities noted (though   aortic valve was poorly visualized).    Rolland Porter, MD 06/18/18 [phone]

## 2018-06-18 NOTE — ED Provider Notes (Signed)
Blood pressure (!) 117/96, pulse 96, temperature (!) 97.5 F (36.4 C), temperature source Oral, resp. rate 17, SpO2 99 %.  Assuming care from Dr. Zenia Resides.  In short, [name] is a 78 y.o. female with a chief complaint of Fall .  Refer to the original H&P for additional details.  The current plan of care is to follow up on SW plan. Pending placement.       Margette Fast, MD 06/18/18 2342

## 2018-06-18 NOTE — Progress Notes (Signed)
Per  [facility] pt's PASRR is: [id number]   No expiration date.  CSW will continue to follow for D/C needs.  [name]. Sosha Shepherd, LCSW, LCAS, CSI Clinical Social Worker Ph: [phone]     ]

## 2018-06-18 NOTE — Telephone Encounter (Signed)
Spoke with [name], he stated he wants to transfer his mother to an assisted living after discharge from the hospital but the hospital is unable to find medical needs for assisted living. He stated that they are waiting for the PT evaluation right and he will update [name].     Copied from [place]. Topic: General - Other >> Jun 18, 2018  1:18 PM Valla Leaver wrote: Reason for CRM: [name], son, requesting a call back from NP Nche regarding patient being hospitalized yesterday for a fall she had over the weekend. The EMS determined her home is uninhabitable and adult protective was notified. [name] was notified by APS that she can't go back to her home until the home is habitable. [name] at [facility] where the doctors are saying she is not incapacitated which makes [place] of attorney, void. Her potassium was low and tightness in her chest. [name] needs help getting her into assisted living or a skilled nursing facility which is what the patient would like as well. She has expressed she is unable to do things by herself including bathe. [name] asking for an Fl2 and will make sure she does ED f/u once released.

## 2018-06-18 NOTE — Clinical Social Work Note (Signed)
Clinical Social Work Assessment  Patient Details  Name: [name] MRN: [id number] Date of Birth: [birth year]  Date of referral:  06/18/18               Reason for consult:  Facility Placement                Permission sought to share information with:  Chartered certified accountant granted to share information::  Yes, Verbal Permission Granted  Name::        Agency::     Relationship::     Contact Information:     Housing/Transportation Living arrangements for the past 2 months:  Single Family Home Source of Information:  Patient, Adult Children Patient Interpreter Needed:  None Criminal Activity/Legal Involvement Pertinent to Current Situation/Hospitalization:    Significant Relationships:  Adult Children Lives with:  Self Do you feel safe going back to the place where you live?  No Need for family participation in patient care:  Yes (Comment)  Care giving concerns:  2nd shift ED CSW received a handoff from the 1st shift [facility] ED CSW.   Per 1st shift ED CSW EPD states pt needs placement.  CSW alerted CSW Asst Director who is aware.  CSW spoke to pt who presented as [facility] and pt's son and stated CSW will assist in placement but that pt/pt's son should be aware that pt will have to D/C to first available placement found by the CSW Dept but that family/pt should be aware that placement is not guaranteed and is not found in a timely manner then the placement from the ED will not be possible and pt must D/C home.  Pt's son ventilated frustration at the inadequacies of the system involving DSS and their failure to provide adequate assistance to the pt/pt's family and also expressed frustration at the hospital's lack of enthusiasm at assisting the pt with placement.   CSW explained the obstacles to placement and explained the status of beds in the ED as emergency and that in most cases placement does not occur on the ED but that the CSW Dept was assiting the pt nonetheless  but could not make any guarantees and ask that pt/pt family guard against false hope that there is not a huge possibility pt will get authorized SNF days and if so, it may be as little as 2-3 days so the pt/pt's family will not be surprised if this happens.  Pt/pt's family voiced understanding and hoped that placement would be possible.  CSW explained the process and obstacles a 2nd and third time ie no acute, rehab-able injuries, possible baseline/custodial status of the pt as see but the insurance company and pt/pt's family voiced understanding.  CSW provided two list (pt and pt's son) of SNF's from Medicare.gov and they will list preferences on 1/16.  Pt stated she has stayed at [facility] (prefers) and [facility] (prefers less).    Social Worker assessment / plan:  CSW met with pt/pt's son and confirmed pt's plan to be discharged to SNF to for rehab and then possible Medicaid approavla for long-term care at discharge.  CSW provided active listening and validated pt's concerns.   CSW will complete FL-2 and send referrals out to SNF facilities via the hub per pt's request.  Pt has been living independently prior to being admitted to [facility].  Employment status:  Retired Nurse, adult PT Recommendations:  Not assessed at this time Information / Referral to community resources:  Patient/Family's Response to care:  Patient alert and oriented.  Patient and pt's son agreeable to plan.  Pt's pt's son and brother supportive and strongly involved in pt.'s care.  Pt.'s son pleasant and appreciated CSW intervention.    Patient/Family's Understanding of and Emotional Response to Diagnosis, Current Treatment, and Prognosis:  Still assessing  Emotional Assessment Appearance:  Appears stated age Attitude/Demeanor/Rapport:    Affect (typically observed):  Agitated, Frustrated, Irritable, Hopeful Orientation:  Oriented to Self, Oriented to Place, Oriented to  Time, Oriented to  Situation Alcohol / Substance use:    Psych involvement (Current and /or in the community):     Discharge Needs  Concerns to be addressed:  No discharge needs identified Readmission within the last 30 days:  No Current discharge risk:  None Barriers to Discharge:  No Barriers Identified   Claudine Mouton, LCSWA 06/18/2018, 10:52 PM

## 2018-06-18 NOTE — ED Notes (Signed)
Pt received in room 4. Alert and oriented. Placed on monitor and vitals obtained. Pt in no apparent distress at this time. Will continue to care for.
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
## 2018-06-18 NOTE — Progress Notes (Addendum)
CSW received handoff from 2nd shift CSW. Per notes, patient is from home where she has been living independently. Per notes, EMS was called as patient had fallen 3x in 24 hours. Per notes, when EMS arrived they called APS to make a report as her house is inhabitable. CSW aware 2nd shift CSW spoke with family at bedside. Patient is wanting to go to an assisted living facility but does not have the payor source. CSW aware 2nd shift CSW attempted to follow up with [name], [facility] caseworker [phone], and left voicemail for return call. 1st shift CSW has left another voicemail for Olivia Mackie for return call.  7:54am- CSW spoke with EDP regarding patient disposition. EDP informed CSW that they have placed a Cardiology consult as patient reported having chest pain. CSW also aware PT consult has been placed.   9:42am- CSW spoke with Shawn Stall at [phone] regarding patient disposition. Per Olivia Mackie, she was assigned to patient on Monday. Olivia Mackie informed CSW she was currently in a meeting but would like to come to hospital to speak with CSW in person. Per Olivia Mackie, she would call CSW back once her meeting is over to coordinate a time to meet up.  11:44am- CSW spoke with Olivia Mackie who informed CSW that patient's house is "bad". Per Olivia Mackie, patient has not been deemed incapable of making decisions. Per Olivia Mackie, she has spoke with patient's son who states he has someone who can come clean the house for the patient. CSW updated Olivia Mackie of barriers to placement.   12:24pm- CSW spoke at length with patient and patient's son at bedside regarding patient's current situation and the barriers to discharge. CSW explained the difference between ALF and SNF, qualifications for each and payor source for each. CSW explained that since patient has [facility] it could take between 24-48 hours to receive insurance authorization and that they do not work on the weekends. CSW explained that it is possible that insurance may not  authorize SNF placement. Patient and patient's son expressed frustration and feel as if no one is helping patient. At one point, patient's son stated he may have to IVC patient as per patient's son, patient has expressed a couple of times that "she'd be better off dead because no one is helping her". Patient also stated this to [place]. Patient's son asked that this be relayed to EDP. Patient then asked if patient's insurance authorization could still take place while patient is here under IVC for psychiatric evaluation. CSW explained that facilities are hesitant to accept people with suicidal ideations. Patient's son then stated patient was not suicidal. CSW explained that CSW heard patient say "she'd be better off dead because no one is helping her" to which patient stated, "that was more of a comment not a threat". Patient now denying SI.   CSW spoke with EDP regarding next steps. Per EDP, CSW should exhaust all options before patient is discharged. CSW aware PT evaluation is still pending. CSW to await PT recommendation.  Ollen Barges, [place] Work Department  [employer]  [phone]

## 2018-06-18 NOTE — NC FL2 (Signed)
[place] LEVEL OF CARE SCREENING TOOL     IDENTIFICATION  Patient Name: [name] Birthdate: [birth year] Sex: female Admission Date (Current Location): 06/17/2018  [facility] and [place] Number:  Herbalist and Address:  [facility],  [place] [address] [place]      Provider Number: [id number]  Attending Physician Name and Address:  Default, Provider, MD  Relative Name and Phone Number:       Current Level of Care: Hospital Recommended Level of Care: [place] Prior Approval Number:    Date Approved/Denied:   PASRR Number: [id number]   Discharge Plan: SNF    Current Diagnoses: Patient Active Problem List   Diagnosis Date Noted  . Edema 11/06/2017  . Uncomplicated opioid dependence ([place]) 07/20/2016  . Rotator cuff tear arthropathy of right shoulder 07/20/2016  . Nontraumatic incomplete tear of right rotator cuff 07/17/2016  . Acute pain of right shoulder 06/27/2016  . Impingement syndrome of right shoulder 06/11/2016  . Colon cancer screening 05/17/2016  . Heme positive stool 05/17/2016  . Esophageal ulcer with bleeding 05/17/2016  . Duodenal ulcer 05/17/2016  . Herniated nucleus pulposus, L2-3 right 12/19/2015  . Right foot pain 07/22/2015  . Chest pain 07/11/2015  . History of drug overdose 07/07/2015  . Frequent falls 07/07/2015  . Hyperlipidemia 07/07/2015  . Essential hypertension 07/07/2015  . Spinal stenosis, lumbar region, with neurogenic claudication 07/07/2015  . Severe episode of recurrent major depressive disorder, with psychotic features ([place]) 07/07/2015  . Visual hallucination 07/01/2015  . Severe episode of recurrent major depressive disorder, without psychotic features ([place])   . Back pain 06/20/2015  . MDD (major depressive disorder), recurrent episode, severe ([place]) 06/16/2015  . Lumbar stenosis 04/19/2015  . Constipation 09/19/2014  . Acute respiratory failure ([place]) 09/17/2014  .  OSA (obstructive sleep apnea) 09/17/2014  . Ankle fracture, lateral malleolus, closed 09/14/2014  . Major depressive disorder, recurrent severe without psychotic features ([place]) 08/07/2014  . Insomnia 08/05/2014  . Hip joint painful on movement 08/05/2014  . Degenerative disc disease, lumbar 08/05/2014  . Severe obesity (BMI >= 40) ([place]) 01/11/2014  . Depression   . Arthritis   . Polyneuropathy in other diseases classified elsewhere ([place]) 04/08/2013  . Restless legs syndrome (RLS) 04/08/2013  . UTI (lower urinary tract infection) 10/14/2011  . Dizziness 10/12/2011  . TIA (transient ischemic attack) 10/12/2011  . GERD (gastroesophageal reflux disease) 10/12/2011    Orientation RESPIRATION BLADDER Height & Weight     Self, Time, Situation, Place  Normal Continent Weight:   Height:     BEHAVIORAL SYMPTOMS/MOOD NEUROLOGICAL BOWEL NUTRITION STATUS      Continent Diet(Regular)  AMBULATORY STATUS COMMUNICATION OF NEEDS Skin   Limited Assist Verbally Normal                       Personal Care Assistance Level of Assistance  Bathing, Dressing Bathing Assistance: Limited assistance   Dressing Assistance: Limited assistance     Functional Limitations Info             SPECIAL CARE FACTORS FREQUENCY  PT (By licensed PT), OT (By licensed OT)     PT Frequency: 5 OT Frequency: 5            Contractures Contractures Info: Not present    Additional Factors Info  Code Status, Allergies Code Status Info: PRIOR Allergies Info: Contrast Media Iodinated Diagnostic Agents, Acyclovir And Related, Famciclovir, Penicillins, Sulfa Drugs  Cross Reactors           Current Medications (06/18/2018):  This is the current hospital active medication list Current Facility-Administered Medications  Medication Dose Route Frequency Provider Last Rate Last Dose  . amLODipine (NORVASC) tablet 10 mg  10 mg Oral Lowanda Foster, MD   10 mg at 06/17/18 2218  . aspirin EC tablet 325 mg   325 mg Oral Daily Davonna Belling, MD   325 mg at 06/18/18 0924  . atorvastatin (LIPITOR) tablet 40 mg  40 mg Oral q1800 Davonna Belling, MD   40 mg at 06/18/18 1955  . gabapentin (NEURONTIN) capsule 600 mg  600 mg Oral QID Davonna Belling, MD   600 mg at 06/18/18 2151  . HYDROcodone-acetaminophen (NORCO/VICODIN) 5-325 MG per tablet 1 tablet  1 tablet Oral Q6H PRN Lacretia Leigh, MD   1 tablet at 06/18/18 1553  . metoprolol succinate (TOPROL-XL) 24 hr tablet 150 mg  150 mg Oral Daily Davonna Belling, MD   150 mg at 06/18/18 0923  . pyridOXINE (VITAMIN B-6) tablet 50 mg  50 mg Oral Daily Davonna Belling, MD   50 mg at 06/18/18 3790  . sertraline (ZOLOFT) tablet 50 mg  50 mg Oral Daily Davonna Belling, MD   50 mg at 06/18/18 2409  . sodium chloride (OCEAN) 0.65 % nasal spray 1 spray  1 spray Each Nare PRN Rolland Porter, MD   1 spray at 06/18/18 1125   Current Outpatient Medications  Medication Sig Dispense Refill  . acetaminophen (TYLENOL) 650 MG CR tablet Take 1,300 mg by mouth 2 (two) times daily as needed for pain.    Marland Kitchen amLODipine (NORVASC) 10 MG tablet Take 1 tablet (10 mg total) by mouth at bedtime. 90 tablet 1  . aspirin EC 325 MG tablet Take 1 tablet (325 mg total) by mouth daily. 90 tablet 3  . atorvastatin (LIPITOR) 40 MG tablet Take 1 tablet (40 mg total) by mouth daily at 6 PM. 90 tablet 1  . cholecalciferol (VITAMIN D) 1000 units tablet Take 1,000 Units by mouth daily.    . cyclobenzaprine (FLEXERIL) 10 MG tablet Take 10 mg by mouth 3 (three) times daily as needed for muscle spasms.    Marland Kitchen gabapentin (NEURONTIN) 600 MG tablet Take 1 tablet (600 mg total) by mouth 4 (four) times daily. (Patient taking differently: Take 600 mg by mouth 4 (four) times daily. With meals and at bedtime) 360 tablet 0  . losartan (COZAAR) 100 MG tablet Take 1 tablet (100 mg total) by mouth daily. 90 tablet 1  . metoprolol succinate (TOPROL-XL) 100 MG 24 hr tablet TAKE 1 TABLET EVERY DAY, TAKE WITH 50 MG  TABLET 90 tablet 1  . metoprolol succinate (TOPROL-XL) 50 MG 24 hr tablet TAKE 1 TABLET EVERY DAY ALONG WITH 100 MG TABLET 90 tablet 1  . pyridOXINE (VITAMIN B-6) 50 MG tablet Take 50 mg by mouth daily.    . sertraline (ZOLOFT) 50 MG tablet Take 1 tablet (50 mg total) by mouth daily. 90 tablet 1  . sodium chloride (OCEAN) 0.65 % SOLN nasal spray Place 1 spray into both nostrils as needed for congestion.  0  . SUMAtriptan (IMITREX) 50 MG tablet TAKE 1 TABLET BY MOUTH AS NEEDED FOR MIGRAINES/HEADACHES. DO NOT EXCEED 2 TABLETS IN 24 HOURS 9 tablet 0  . methocarbamol (ROBAXIN) 500 MG tablet Take 1 tablet (500 mg total) by mouth every 12 (twelve) hours as needed for muscle spasms. (Patient not taking: Reported on  06/17/2018) 6 tablet 0  . pantoprazole (PROTONIX) 20 MG tablet Take 1 tablet (20 mg total) by mouth daily. (Patient not taking: Reported on 06/17/2018) 90 tablet 1     Discharge Medications: Please see discharge summary for a list of discharge medications.  Relevant Imaging Results:  Relevant Lab Results:   Additional Information [id number]  [name] Cortny Bambach, LCSWA

## 2018-06-18 NOTE — Evaluation (Addendum)
Physical Therapy Evaluation Patient Details Name: [name] MRN: [id number] DOB: 15-May-1941 Today's Date: 06/18/2018   History of Present Illness  78 y.o. female admitted with multiple falls at home. PMH of R TKA 2013, ORIF R ankle fx 2016, lumbar laminectomy 2016, peripheral neuropahty.   Clinical Impression  Pt admitted with above diagnosis. Pt currently with functional limitations due to the deficits listed below (see PT Problem List). Pt ambulated 10' with RW, distance limited by LEs buckling. Pt reports 5 falls in 24 hours prior to admission. [name] not safe to DC home from mobility standpoint, also noted home deemed uninhabitable. [name] a high fall risk and requires assistance for bed mobility, transfers, and ambulation. ST-SNF recommended.  Pt will benefit from skilled PT to increase their independence and safety with mobility to allow discharge to the venue listed below.    Phoned pt's son, Konrad Dolores, per his request, and notified him of PT recommendation for SNF.     Follow Up Recommendations SNF;Supervision/Assistance - 24 hour;Supervision for mobility/OOB    Equipment Recommendations  None recommended by PT    Recommendations for Other Services       Precautions / Restrictions Precautions Precautions: Fall Precaution Comments: "many" falls at home prior to admission, per chart EMS was called 5x in 24 hours for falls Restrictions Weight Bearing Restrictions: No      Mobility  Bed Mobility Overal bed mobility: Needs Assistance Bed Mobility: Supine to Sit;Sit to Supine     Supine to sit: Min assist Sit to supine: Mod assist   General bed mobility comments: assist to raise trunk; mod assist for LEs into bed  Transfers Overall transfer level: Needs assistance Equipment used: Rolling walker (2 wheeled) Transfers: Sit to/from Stand Sit to Stand: From elevated surface;Min assist            Ambulation/Gait Ambulation/Gait assistance: +2 safety/equipment;Min  assist Gait Distance (Feet): 10 Feet Assistive device: Rolling walker (2 wheeled) Gait Pattern/deviations: Step-through pattern;Decreased stride length Gait velocity: decr   General Gait Details: distance limited by LEs starting to buckle  Stairs            Wheelchair Mobility    Modified Rankin (Stroke Patients Only)       Balance Overall balance assessment: History of Falls;Needs assistance   Sitting balance-Leahy Scale: Good     Standing balance support: Bilateral upper extremity supported Standing balance-Leahy Scale: Poor Standing balance comment: relies on BUE support                             Pertinent Vitals/Pain Pain Assessment: 0-10 Pain Score: 7  Pain Location: B feet (neuropathy pain per pt 2* gabapentin dose being last) Pain Descriptors / Indicators: Stabbing Pain Intervention(s): Limited activity within patient's tolerance;Monitored during session;Patient requesting pain meds-RN notified    Home Living Family/patient expects to be discharged to:: Unsure(pt was living at home PTA, home has been deemed uninhabitable by APS) Living Arrangements: Alone Available Help at Discharge: Family;Available PRN/intermittently Type of Home: House Home Access: Stairs to enter   Entrance Stairs-Number of Steps: 1 Home Layout: One level Home Equipment: Walker - 2 wheels;Walker - 4 wheels;Shower seat - built in;Wheelchair - manual Additional Comments: has lift chair in bathtub    Prior Function Level of Independence: Independent with assistive device(s)         Comments: son helps with groceries, walked short distances with RW (distance limited by SOB and  knees buckling), multiple falls per son/pt     Hand Dominance   Dominant Hand: Right    Extremity/Trunk Assessment   Upper Extremity Assessment Upper Extremity Assessment: Generalized weakness(B shoulder flexion -4/5)    Lower Extremity Assessment Lower Extremity Assessment:  Generalized weakness(decreased sensation to light touch B feet (baseline per pt), B knee extension -4/5)    Cervical / Trunk Assessment Cervical / Trunk Assessment: Normal  Communication   Communication: No difficulties  Cognition Arousal/Alertness: Awake/alert Behavior During Therapy: WFL for tasks assessed/performed Overall Cognitive Status: Within Functional Limits for tasks assessed                                        General Comments      Exercises     Assessment/Plan    PT Assessment Patient needs continued PT services  PT Problem List Decreased strength;Decreased balance;Decreased activity tolerance;Impaired sensation;Decreased mobility;Pain       PT Treatment Interventions Gait training;DME instruction;Functional mobility training;Therapeutic activities;Therapeutic exercise;Balance training;Patient/family education    PT Goals (Current goals can be found in the Care Plan section)  Acute Rehab PT Goals Patient Stated Goal: wishes she could go home but knows she can't; would like ALF or SNF placement PT Goal Formulation: With patient/family Time For Goal Achievement: 07/02/18 Potential to Achieve Goals: Fair    Frequency Min 2X/week   Barriers to discharge Inaccessible home environment      Co-evaluation               AM-PAC PT "6 Clicks" Mobility  Outcome Measure Help needed turning from your back to your side while in a flat bed without using bedrails?: A Little Help needed moving from lying on your back to sitting on the side of a flat bed without using bedrails?: A Little Help needed moving to and from a bed to a chair (including a wheelchair)?: A Little Help needed standing up from a chair using your arms (e.g., wheelchair or bedside chair)?: A Lot Help needed to walk in hospital room?: A Lot Help needed climbing 3-5 steps with a railing? : Total 6 Click Score: 14    End of Session Equipment Utilized During Treatment: Gait  belt Activity Tolerance: Patient limited by fatigue;Patient limited by pain Patient left: in bed;with call bell/phone within reach;with nursing/sitter in room Nurse Communication: Mobility status PT Visit Diagnosis: Unsteadiness on feet (R26.81);Difficulty in walking, not elsewhere classified (R26.2);Repeated falls (R29.6);History of falling (Z91.81);Pain Pain - part of body: Ankle and joints of foot    Time: 6010-9323 PT Time Calculation (min) (ACUTE ONLY): 25 min   Charges:   PT Evaluation $PT Eval Low Complexity: 1 Low PT Treatments $Gait Training: 8-22 mins       Blondell Reveal Kistler PT 06/18/2018  Acute Rehabilitation Services Pager [phone] Office [phone]

## 2018-06-18 NOTE — Telephone Encounter (Signed)
Isabella Krist J. [name], RN, BSN, [employer] [phone] Spoke with pt via telehone regarding discharge planning for [employer]. Offered pt list of home health agencies to choose from.  Pt chose [facility] to render services. [name] of [facility] notified. Patient made aware that [facility] will be in contact in 24-48 hours.  No DME needs identified at this time.

## 2018-06-18 NOTE — ED Notes (Signed)
SW at bedside.

## 2018-06-18 NOTE — Progress Notes (Signed)
2nd shift ED CSW received a handoff from the 1st shift [facility] ED CSW.   Per 1st shift ED CSW EPD states pt needs placement.  CSW alerted CSW Asst Director who is aware.  CSW spoke to pt who presented as A&OX4 and pt's son and stated CSW will assist in placement but that pt/pt's son should be aware that pt will have to D/C to first available placement found by the CSW Dept but that family/pt should be aware that placement is not guaranteed and is not found in a timely manner then the placement from the ED will not be possible and pt must D/C home.  Pt's son ventilated frustration at the inadequacies of the system involving DSS and their failure to provide adequate assistance to the pt/pt's family and also expressed frustration at the hospital's lack of enthusiasm at assisting the pt with placement.   CSW explained the obstacles to placement and explained the status of beds in the ED as emergency and that in most cases placement does not occur on the ED but that the CSW Dept was assiting the pt nonetheless but could not make any guarantees and ask that pt/pt family guard against false hope that there is not a huge possibility pt will get authorized SNF days and if so, it may be as little as 2-3 days so the pt/pt's family will not be surprised if this happens.  Pt/pt's family voiced understanding and hoped that placement would be possible.  CSW explained the process and obstacles a 2nd and third time ie no acute, rehab-able injuries, possible baseline/custodial status of the pt as see but the insurance company and pt/pt's family voiced understanding.  CSW met with pt/pt's son and confirmed pt's plan to be discharged to SNF to for rehab and then possible Medicaid approavla for long-term care at discharge.  CSW provided active listening and validated pt's concerns.   CSW will complete FL-2 and send referrals out to SNF facilities via the hub per pt's request.  Pt has been living independently prior to being  admitted to [facility].  CSW will continue to follow for D/C needs.  [name]. Bashar Milam, LCSW, LCAS, CSI Clinical Social Worker Ph: [phone]

## 2018-06-19 DIAGNOSIS — R296 Repeated falls: Secondary | ICD-10-CM

## 2018-06-19 NOTE — Progress Notes (Signed)
Progress Note  Patient Name: [name] Date of Encounter: 06/19/2018  Primary Cardiologist:  Quay Burow, MD  Subjective   Still complains of chest pressure, has not been a 0 in a long time.  Pain medications that she gets for her neuropathy are helping a little  Inpatient Medications    Scheduled Meds: . amLODipine  10 mg Oral QHS  . aspirin EC  325 mg Oral Daily  . atorvastatin  40 mg Oral q1800  . gabapentin  600 mg Oral QID  . metoprolol succinate  150 mg Oral Daily  . pyridOXINE  50 mg Oral Daily  . sertraline  50 mg Oral Daily   Continuous Infusions:  PRN Meds: HYDROcodone-acetaminophen, sodium chloride   Vital Signs    Vitals:   06/19/18 0100 06/19/18 0136 06/19/18 0200 06/19/18 0602  BP: 109/61 (!) 108/57 121/62 130/61  Pulse: 82 85 86 83  Resp: 16 16 16 17   Temp:    98.3 F (36.8 C)  TempSrc:    Oral  SpO2: 97% 100% 98% 94%    Intake/Output Summary (Last 24 hours) at 06/19/2018 0858 Last data filed at 06/19/2018 0606 Gross per 24 hour  Intake 120 ml  Output -  Net 120 ml   There were no vitals filed for this visit.  Telemetry    Sinus rhythm- Personally Reviewed  ECG    1/15 ECG is sinus rhythm, heart rate 85, diffuse ST and T wave abnormalities similar to 2017 ECG- Personally Reviewed  Physical Exam   General: Well developed, well nourished, elderly female appearing in no acute distress. Head: Normocephalic, atraumatic.  Neck: Supple without bruits, JVD not elevated. Lungs:  Resp regular and unlabored, few rales, clears with deep inspiration. Heart: RRR, S1, S2, no S3, S4, soft murmur; no rub. Abdomen: Soft, non-tender, non-distended with normoactive bowel sounds. No hepatomegaly. No rebound/guarding. No obvious abdominal masses. Extremities: No clubbing, cyanosis, no edema. Distal pedal pulses are 2+ bilaterally. Neuro: Alert and oriented X 3. Moves all extremities spontaneously. Psych: Normal affect.  Labs     Hematology Recent Labs  Lab 06/17/18 1802  WBC 6.9  RBC 3.93  HGB 14.2  HCT 45.2  MCV 115.0*  MCH 36.1*  MCHC 31.4  RDW 14.7  PLT 283    Chemistry Recent Labs  Lab 06/17/18 1802  NA 135  K 3.1*  CL 105  CO2 19*  GLUCOSE 139*  BUN 28*  CREATININE 1.35*  CALCIUM 9.0  PROT 7.2  ALBUMIN 3.6  AST 38  ALT 19  ALKPHOS 79  BILITOT 0.8  GFRNONAA 38*  GFRAA 44*  ANIONGAP 11     Cardiac Enzymes Recent Labs  Lab 06/18/18 0610 06/18/18 0847  TROPONINI <0.03 <0.03    Radiology    Dg Chest 2 View  Result Date: 06/17/2018 CLINICAL DATA:  Fall EXAM: CHEST - 2 VIEW COMPARISON:  06/17/2016 chest radiograph. FINDINGS: Surgical hardware from [facility] overlies the lower cervical spine. Stable cardiomediastinal silhouette with top-normal heart size. No pneumothorax. No pleural effusion. Stable mild-to-moderate elevation of the right hemidiaphragm with mild right basilar atelectasis. No pulmonary edema. No acute consolidative airspace disease. IMPRESSION: Stable mild-to-moderate elevation of the right hemidiaphragm with mild right basilar atelectasis. No acute cardiopulmonary disease. Electronically Signed   By: Ilona Sorrel M.D.   On: 06/17/2018 18:31   Ct Head Wo Contrast  Result Date: 06/17/2018 CLINICAL DATA:  Recent falls with headaches and neck pain, initial encounter EXAM: CT HEAD WITHOUT CONTRAST  CT CERVICAL SPINE WITHOUT CONTRAST TECHNIQUE: Multidetector CT imaging of the head and cervical spine was performed following the standard protocol without intravenous contrast. Multiplanar CT image reconstructions of the cervical spine were also generated. COMPARISON:  06/17/2016 FINDINGS: CT HEAD FINDINGS Brain: No findings to suggest acute hemorrhage, acute infarction or space-occupying mass lesion are noted. Areas of decreased attenuation are noted within the subinsular cortex as well as the right thalamus consistent with prior lacunar infarct. Vascular: No hyperdense vessel or  unexpected calcification. Skull: Normal. Negative for fracture or focal lesion. Sinuses/Orbits: No acute finding. Other: None. CT CERVICAL SPINE FINDINGS Alignment: Within normal limits. Skull base and vertebrae: Postsurgical changes are noted at C5-6 and C6-7 with anterior fixation and interbody fusion. Facet hypertrophic changes are identified. Mild osteophytic changes are seen. No acute fracture or acute facet abnormality is noted. The odontoid is within normal limits. Soft tissues and spinal canal: No prevertebral fluid or swelling. No visible canal hematoma. Upper chest: Within normal limits. Other: None IMPRESSION: CT of the head: Chronic ischemic changes without acute abnormality. CT of the cervical spine: Postoperative and degenerative changes without acute abnormality. Electronically Signed   By: Inez Catalina M.D.   On: 06/17/2018 14:38   Ct Cervical Spine Wo Contrast  Result Date: 06/17/2018 CLINICAL DATA:  Recent falls with headaches and neck pain, initial encounter EXAM: CT HEAD WITHOUT CONTRAST CT CERVICAL SPINE WITHOUT CONTRAST TECHNIQUE: Multidetector CT imaging of the head and cervical spine was performed following the standard protocol without intravenous contrast. Multiplanar CT image reconstructions of the cervical spine were also generated. COMPARISON:  06/17/2016 FINDINGS: CT HEAD FINDINGS Brain: No findings to suggest acute hemorrhage, acute infarction or space-occupying mass lesion are noted. Areas of decreased attenuation are noted within the subinsular cortex as well as the right thalamus consistent with prior lacunar infarct. Vascular: No hyperdense vessel or unexpected calcification. Skull: Normal. Negative for fracture or focal lesion. Sinuses/Orbits: No acute finding. Other: None. CT CERVICAL SPINE FINDINGS Alignment: Within normal limits. Skull base and vertebrae: Postsurgical changes are noted at C5-6 and C6-7 with anterior fixation and interbody fusion. Facet hypertrophic changes  are identified. Mild osteophytic changes are seen. No acute fracture or acute facet abnormality is noted. The odontoid is within normal limits. Soft tissues and spinal canal: No prevertebral fluid or swelling. No visible canal hematoma. Upper chest: Within normal limits. Other: None IMPRESSION: CT of the head: Chronic ischemic changes without acute abnormality. CT of the cervical spine: Postoperative and degenerative changes without acute abnormality. Electronically Signed   By: Inez Catalina M.D.   On: 06/17/2018 14:38     Cardiac Studies   ECHO:    Patient Profile     78 y.o. female w/ hx HTN, HLD, depression (with prior admission for such), anxiety, chronic pain, OSA, possible remote TIA 2013, CKD, GERD, RLS, peripheral neuropathy, prior addicion, obesity, prior admission for hallucination/[facility] (06/2015) was admitted 01/15 for chest pain, cards seeing.   Assessment & Plan    1. Chest pain:  - ez neg MI and ECG unchanged despite prolonged pain - similar sx since 2015, no ischemic cause on echo/MV - no plans for additional cardiac testing   Otherwise, per IM, d/c to SNF when medically stable. Principal Problem:   Frequent falls Active Problems:   Hypokalemia   Chest pain, atypical  [facility] will sign off.   Medication Recommendations: Pain management per IM Other recommendations (labs, testing, etc): None Follow up as an outpatient: PRN  Signed,  Rosaria Ferries , PA-C 8:58 AM 06/19/2018 Pager: [phone]

## 2018-06-19 NOTE — ED Notes (Signed)
Patient requesting her pain medication for left foot pain.  Rates pain as a 10/10.

## 2018-06-19 NOTE — ED Notes (Signed)
Pt alert and cooperative. Pt resting bed quietly, with no complaints. Pt tolerated snack and medication well. Pt safe, will continue to monitor

## 2018-06-19 NOTE — Progress Notes (Addendum)
CSW received handoff from 2nd shift CSW. Per notes, PT is currently recommending patient go to a SNF. CSW aware 2nd shift CSW has started process and faxed patient's information to SNFs in [place] and surrounding areas. CSW awaiting bed offers to present to patient and family so that insurance authorization can begin. CSW will continue to follow.  11:07am- CSW spoke with patient at bedside regarding bed offers. Patient requesting to go to [facility]. CSW has attempted to reach out to [place] in Admissions and let her know so they can start insurance authorization. CSW is awaiting confirmation that they are able to start auth. CSW will continue to follow.  11:38am- CSW received return text from [place] in Admissions that she has started patient's insurance authorization. CSW has updated patient's son, Konrad Dolores [phone], regarding updated information.   Ollen Barges, [place] Work Department  [employer]  [phone]

## 2018-06-20 DIAGNOSIS — G629 Polyneuropathy, unspecified: Secondary | ICD-10-CM | POA: Diagnosis not present

## 2018-06-20 DIAGNOSIS — M6281 Muscle weakness (generalized): Secondary | ICD-10-CM | POA: Diagnosis not present

## 2018-06-20 DIAGNOSIS — E669 Obesity, unspecified: Secondary | ICD-10-CM | POA: Diagnosis not present

## 2018-06-20 DIAGNOSIS — R262 Difficulty in walking, not elsewhere classified: Secondary | ICD-10-CM | POA: Diagnosis not present

## 2018-06-20 DIAGNOSIS — Z7409 Other reduced mobility: Secondary | ICD-10-CM | POA: Diagnosis not present

## 2018-06-20 DIAGNOSIS — Z741 Need for assistance with personal care: Secondary | ICD-10-CM | POA: Diagnosis not present

## 2018-06-20 DIAGNOSIS — E876 Hypokalemia: Secondary | ICD-10-CM | POA: Diagnosis not present

## 2018-06-20 DIAGNOSIS — M519 Unspecified thoracic, thoracolumbar and lumbosacral intervertebral disc disorder: Secondary | ICD-10-CM | POA: Diagnosis not present

## 2018-06-20 DIAGNOSIS — Z7982 Long term (current) use of aspirin: Secondary | ICD-10-CM | POA: Diagnosis not present

## 2018-06-20 DIAGNOSIS — R4589 Other symptoms and signs involving emotional state: Secondary | ICD-10-CM | POA: Diagnosis not present

## 2018-06-20 DIAGNOSIS — Z96653 Presence of artificial knee joint, bilateral: Secondary | ICD-10-CM | POA: Diagnosis not present

## 2018-06-20 DIAGNOSIS — S335XXD Sprain of ligaments of lumbar spine, subsequent encounter: Secondary | ICD-10-CM | POA: Diagnosis not present

## 2018-06-20 DIAGNOSIS — R1312 Dysphagia, oropharyngeal phase: Secondary | ICD-10-CM | POA: Diagnosis not present

## 2018-06-20 DIAGNOSIS — Z8673 Personal history of transient ischemic attack (TIA), and cerebral infarction without residual deficits: Secondary | ICD-10-CM | POA: Diagnosis not present

## 2018-06-20 DIAGNOSIS — I10 Essential (primary) hypertension: Secondary | ICD-10-CM | POA: Diagnosis not present

## 2018-06-20 DIAGNOSIS — R2681 Unsteadiness on feet: Secondary | ICD-10-CM | POA: Diagnosis not present

## 2018-06-20 DIAGNOSIS — G4700 Insomnia, unspecified: Secondary | ICD-10-CM | POA: Diagnosis not present

## 2018-06-20 DIAGNOSIS — G894 Chronic pain syndrome: Secondary | ICD-10-CM | POA: Diagnosis not present

## 2018-06-20 DIAGNOSIS — R51 Headache: Secondary | ICD-10-CM | POA: Diagnosis not present

## 2018-06-20 DIAGNOSIS — S39012A Strain of muscle, fascia and tendon of lower back, initial encounter: Secondary | ICD-10-CM | POA: Diagnosis not present

## 2018-06-20 DIAGNOSIS — J029 Acute pharyngitis, unspecified: Secondary | ICD-10-CM | POA: Diagnosis not present

## 2018-06-20 DIAGNOSIS — M25562 Pain in left knee: Secondary | ICD-10-CM | POA: Diagnosis not present

## 2018-06-20 DIAGNOSIS — R270 Ataxia, unspecified: Secondary | ICD-10-CM | POA: Diagnosis not present

## 2018-06-20 DIAGNOSIS — J0140 Acute pansinusitis, unspecified: Secondary | ICD-10-CM | POA: Diagnosis not present

## 2018-06-20 DIAGNOSIS — Z765 Malingerer [conscious simulation]: Secondary | ICD-10-CM | POA: Diagnosis not present

## 2018-06-20 DIAGNOSIS — R296 Repeated falls: Secondary | ICD-10-CM | POA: Diagnosis not present

## 2018-06-20 DIAGNOSIS — N289 Disorder of kidney and ureter, unspecified: Secondary | ICD-10-CM | POA: Diagnosis not present

## 2018-06-20 DIAGNOSIS — F419 Anxiety disorder, unspecified: Secondary | ICD-10-CM | POA: Diagnosis not present

## 2018-06-20 DIAGNOSIS — R0982 Postnasal drip: Secondary | ICD-10-CM | POA: Diagnosis not present

## 2018-06-20 MED ORDER — ACETAMINOPHEN 500 MG PO TABS
500.0000 mg | ORAL_TABLET | Freq: Once | ORAL | Status: AC
  Administered 2018-06-20: 500 mg via ORAL
  Filled 2018-06-20: qty 1

## 2018-06-20 NOTE — Progress Notes (Signed)
Patient has been accepted to [facility] for today 1/17. Patient, RN and EDP agreeable to discharge plan. CSW briefly spoke with patient's daughter-in-law regarding discharge plans and she stated she would relay information to patient's son, [name]. Please call report to [phone].   CSW spoke with patient's son, [name], who reports he can transport patient to [facility] but could not pick up patient until 3:30pm. CSW to update RN.  Ollen Barges, [place] Work Department  [employer]  [phone]

## 2018-06-20 NOTE — ED Notes (Signed)
Pt discharged safely with son.  Pt was escorted out by wheelchair.  All of her belongings were returned.  Pt was in no distress.  Discharge instructions were reviewed.

## 2018-06-20 NOTE — Progress Notes (Addendum)
CSW still following for discharge needs. CSW aware that [facility] started insurance authorization yesterday. CSW to follow up with Olivia Mackie in admissions today regarding updates.   9:14am- CSW spoke with Olivia Mackie who stated she has not yet received insurance authorization but will update CSW as soon as she does. CSW will continue to follow.  Ollen Barges, [place] Work Department  [employer]  [phone]

## 2018-06-20 NOTE — ED Provider Notes (Signed)
Patient here for evaluation following a fall.  She has been accepted to SNF.  Plan to d/c with outpatient follow up.  Patient without current complaints.     Quintella Reichert, MD 06/20/18 [phone]

## 2018-06-23 DIAGNOSIS — R296 Repeated falls: Secondary | ICD-10-CM | POA: Diagnosis not present

## 2018-06-23 DIAGNOSIS — G894 Chronic pain syndrome: Secondary | ICD-10-CM | POA: Diagnosis not present

## 2018-06-23 DIAGNOSIS — E669 Obesity, unspecified: Secondary | ICD-10-CM | POA: Diagnosis not present

## 2018-06-23 DIAGNOSIS — G629 Polyneuropathy, unspecified: Secondary | ICD-10-CM | POA: Diagnosis not present

## 2018-06-26 DIAGNOSIS — R4589 Other symptoms and signs involving emotional state: Secondary | ICD-10-CM | POA: Diagnosis not present

## 2018-06-26 DIAGNOSIS — M25562 Pain in left knee: Secondary | ICD-10-CM | POA: Diagnosis not present

## 2018-06-26 DIAGNOSIS — G629 Polyneuropathy, unspecified: Secondary | ICD-10-CM | POA: Diagnosis not present

## 2018-06-26 DIAGNOSIS — R296 Repeated falls: Secondary | ICD-10-CM | POA: Diagnosis not present

## 2018-06-30 DIAGNOSIS — G4700 Insomnia, unspecified: Secondary | ICD-10-CM | POA: Diagnosis not present

## 2018-06-30 DIAGNOSIS — R4589 Other symptoms and signs involving emotional state: Secondary | ICD-10-CM | POA: Diagnosis not present

## 2018-06-30 DIAGNOSIS — Z7409 Other reduced mobility: Secondary | ICD-10-CM | POA: Diagnosis not present

## 2018-07-08 DIAGNOSIS — G894 Chronic pain syndrome: Secondary | ICD-10-CM | POA: Diagnosis not present

## 2018-07-08 DIAGNOSIS — M25562 Pain in left knee: Secondary | ICD-10-CM | POA: Diagnosis not present

## 2018-07-08 DIAGNOSIS — F419 Anxiety disorder, unspecified: Secondary | ICD-10-CM | POA: Diagnosis not present

## 2018-07-08 DIAGNOSIS — R4589 Other symptoms and signs involving emotional state: Secondary | ICD-10-CM | POA: Diagnosis not present

## 2018-07-08 DIAGNOSIS — Z765 Malingerer [conscious simulation]: Secondary | ICD-10-CM | POA: Diagnosis not present

## 2018-07-08 DIAGNOSIS — G4700 Insomnia, unspecified: Secondary | ICD-10-CM | POA: Diagnosis not present

## 2018-07-10 ENCOUNTER — Ambulatory Visit: Payer: Self-pay | Admitting: Nurse Practitioner

## 2018-07-10 ENCOUNTER — Encounter: Payer: Medicare HMO | Admitting: Family Medicine

## 2018-07-11 DIAGNOSIS — J0140 Acute pansinusitis, unspecified: Secondary | ICD-10-CM | POA: Diagnosis not present

## 2018-07-11 DIAGNOSIS — J029 Acute pharyngitis, unspecified: Secondary | ICD-10-CM | POA: Diagnosis not present

## 2018-07-11 DIAGNOSIS — F419 Anxiety disorder, unspecified: Secondary | ICD-10-CM | POA: Diagnosis not present

## 2018-07-11 DIAGNOSIS — R0982 Postnasal drip: Secondary | ICD-10-CM | POA: Diagnosis not present

## 2018-07-13 DIAGNOSIS — M79601 Pain in right arm: Secondary | ICD-10-CM | POA: Diagnosis not present

## 2018-07-13 DIAGNOSIS — M25511 Pain in right shoulder: Secondary | ICD-10-CM | POA: Diagnosis not present

## 2018-07-14 DIAGNOSIS — M25511 Pain in right shoulder: Secondary | ICD-10-CM | POA: Diagnosis not present

## 2018-07-14 DIAGNOSIS — R296 Repeated falls: Secondary | ICD-10-CM | POA: Diagnosis not present

## 2018-07-14 DIAGNOSIS — M62838 Other muscle spasm: Secondary | ICD-10-CM | POA: Diagnosis not present

## 2018-07-14 DIAGNOSIS — M542 Cervicalgia: Secondary | ICD-10-CM | POA: Diagnosis not present

## 2018-07-15 ENCOUNTER — Other Ambulatory Visit: Payer: Self-pay

## 2018-07-15 NOTE — Patient Outreach (Signed)
[place] [facility]) Care Management  07/15/2018  [name] November 29, 1940 [id number]  Transition of care  Referral date: 07/15/18 Referral source: discharged from [facility] and rehab on 07/12/18 Insurance: [employer] Attempt #1  Telephone call to patient regarding transition of care referral. Attempted call to listed home number. Call dropped x 2.  Attempted call to listed mobile number. Unable to leave voice message due to voice mail box not being set up.    PLAN: RNCM will attempt 2nd telephone call to patient within 4 business days.  RNCM will send patient outreach letter to attempt contact  Quinn Plowman RN,BSN,CCM [facility] Telephonic  [phone]

## 2018-07-18 ENCOUNTER — Other Ambulatory Visit: Payer: Self-pay

## 2018-07-18 NOTE — Patient Outreach (Signed)
[place] [facility]) Care Management  07/18/2018  GEORGINE WILTSE [birth year] [id number]  Transition of care  Referral date: 07/15/18 Referral source: discharged from [facility] and rehab on 07/12/18 Insurance: [employer] Attempt #2  Telephone call to patient regarding transition of care referral. Unable to reach patient. Attempted home number, message states " no route found.'  Attempted call to alternate number, " Message states voice mail box is not set up."  Attempted call to listed mobile number. Unable to reach patient. HIPAA compliant message left with call back phone number.   PLAN:  RNCM will attempt 3rd telephone call to patient within 4 business days.   Quinn Plowman RN,BSN,CCM [facility] Telephonic  [phone]

## 2018-07-21 DIAGNOSIS — Z765 Malingerer [conscious simulation]: Secondary | ICD-10-CM | POA: Diagnosis not present

## 2018-07-21 DIAGNOSIS — Z79899 Other long term (current) drug therapy: Secondary | ICD-10-CM | POA: Diagnosis not present

## 2018-07-21 DIAGNOSIS — G894 Chronic pain syndrome: Secondary | ICD-10-CM | POA: Diagnosis not present

## 2018-07-22 DIAGNOSIS — R4589 Other symptoms and signs involving emotional state: Secondary | ICD-10-CM | POA: Diagnosis not present

## 2018-07-22 DIAGNOSIS — G4700 Insomnia, unspecified: Secondary | ICD-10-CM | POA: Diagnosis not present

## 2018-07-22 DIAGNOSIS — G894 Chronic pain syndrome: Secondary | ICD-10-CM | POA: Diagnosis not present

## 2018-07-23 ENCOUNTER — Other Ambulatory Visit: Payer: Self-pay

## 2018-07-23 DIAGNOSIS — M6281 Muscle weakness (generalized): Secondary | ICD-10-CM | POA: Diagnosis not present

## 2018-07-23 DIAGNOSIS — S335XXD Sprain of ligaments of lumbar spine, subsequent encounter: Secondary | ICD-10-CM | POA: Diagnosis not present

## 2018-07-23 DIAGNOSIS — E559 Vitamin D deficiency, unspecified: Secondary | ICD-10-CM | POA: Diagnosis not present

## 2018-07-23 DIAGNOSIS — R2689 Other abnormalities of gait and mobility: Secondary | ICD-10-CM | POA: Diagnosis not present

## 2018-07-23 NOTE — Patient Outreach (Signed)
[place] [facility]) Care Management  07/23/2018  [name] 09/08/1940 [id number]  Transition of care  Referral date:07/15/18 Referral source:discharged from [facility] and rehab on 07/12/18 Insurance:[employer]  Telephone call to patient regarding transition of care follow up.  HIPAA verified with patient.  Explained reason for call. Patient states she is still in [place] [facility] and will not be going home.   PLAN; RNCM will close patient due to patient not meeting program criteria RNCM will send patients primary MD closure letter  Quinn Plowman RN,BSN,CCM [facility] Telephonic  [phone]

## 2018-07-28 DIAGNOSIS — E531 Pyridoxine deficiency: Secondary | ICD-10-CM | POA: Diagnosis not present

## 2018-07-28 DIAGNOSIS — E559 Vitamin D deficiency, unspecified: Secondary | ICD-10-CM | POA: Diagnosis not present

## 2018-07-28 DIAGNOSIS — E539 Vitamin B deficiency, unspecified: Secondary | ICD-10-CM | POA: Diagnosis not present

## 2018-07-28 DIAGNOSIS — I10 Essential (primary) hypertension: Secondary | ICD-10-CM | POA: Diagnosis not present

## 2018-07-28 DIAGNOSIS — I5020 Unspecified systolic (congestive) heart failure: Secondary | ICD-10-CM | POA: Diagnosis not present

## 2018-07-28 DIAGNOSIS — Z79899 Other long term (current) drug therapy: Secondary | ICD-10-CM | POA: Diagnosis not present

## 2018-07-28 DIAGNOSIS — D649 Anemia, unspecified: Secondary | ICD-10-CM | POA: Diagnosis not present

## 2018-07-29 ENCOUNTER — Encounter

## 2018-07-29 ENCOUNTER — Ambulatory Visit: Payer: Self-pay | Admitting: Neurology

## 2018-08-04 DIAGNOSIS — Z79899 Other long term (current) drug therapy: Secondary | ICD-10-CM | POA: Diagnosis not present

## 2018-08-05 DIAGNOSIS — R0902 Hypoxemia: Secondary | ICD-10-CM | POA: Diagnosis not present

## 2018-08-05 DIAGNOSIS — H8110 Benign paroxysmal vertigo, unspecified ear: Secondary | ICD-10-CM | POA: Diagnosis not present

## 2018-08-07 DIAGNOSIS — D519 Vitamin B12 deficiency anemia, unspecified: Secondary | ICD-10-CM | POA: Diagnosis not present

## 2018-08-07 DIAGNOSIS — R0902 Hypoxemia: Secondary | ICD-10-CM | POA: Diagnosis not present

## 2018-08-07 DIAGNOSIS — R509 Fever, unspecified: Secondary | ICD-10-CM | POA: Diagnosis not present

## 2018-08-07 DIAGNOSIS — R109 Unspecified abdominal pain: Secondary | ICD-10-CM | POA: Diagnosis not present

## 2018-08-07 DIAGNOSIS — D7589 Other specified diseases of blood and blood-forming organs: Secondary | ICD-10-CM | POA: Diagnosis not present

## 2018-08-07 DIAGNOSIS — Z79899 Other long term (current) drug therapy: Secondary | ICD-10-CM | POA: Diagnosis not present

## 2018-08-08 DIAGNOSIS — D519 Vitamin B12 deficiency anemia, unspecified: Secondary | ICD-10-CM | POA: Diagnosis not present

## 2018-08-08 DIAGNOSIS — R9389 Abnormal findings on diagnostic imaging of other specified body structures: Secondary | ICD-10-CM | POA: Diagnosis not present

## 2018-08-08 DIAGNOSIS — R0902 Hypoxemia: Secondary | ICD-10-CM | POA: Diagnosis not present

## 2018-08-08 DIAGNOSIS — R300 Dysuria: Secondary | ICD-10-CM | POA: Diagnosis not present

## 2018-08-08 DIAGNOSIS — M62838 Other muscle spasm: Secondary | ICD-10-CM | POA: Diagnosis not present

## 2018-08-09 ENCOUNTER — Emergency Department (HOSPITAL_COMMUNITY): Payer: Medicare HMO

## 2018-08-09 ENCOUNTER — Other Ambulatory Visit: Payer: Self-pay

## 2018-08-09 ENCOUNTER — Emergency Department (HOSPITAL_COMMUNITY)
Admission: EM | Admit: 2018-08-09 | Discharge: 2018-08-10 | Disposition: A | Discharge status: Skilled Nursing Facility | Payer: Medicare HMO | Attending: Emergency Medicine | Admitting: Emergency Medicine

## 2018-08-09 DIAGNOSIS — R091 Pleurisy: Secondary | ICD-10-CM | POA: Diagnosis not present

## 2018-08-09 DIAGNOSIS — R1031 Right lower quadrant pain: Secondary | ICD-10-CM | POA: Insufficient documentation

## 2018-08-09 DIAGNOSIS — F29 Unspecified psychosis not due to a substance or known physiological condition: Secondary | ICD-10-CM | POA: Diagnosis not present

## 2018-08-09 DIAGNOSIS — R109 Unspecified abdominal pain: Secondary | ICD-10-CM

## 2018-08-09 DIAGNOSIS — R42 Dizziness and giddiness: Secondary | ICD-10-CM | POA: Diagnosis not present

## 2018-08-09 DIAGNOSIS — I499 Cardiac arrhythmia, unspecified: Secondary | ICD-10-CM | POA: Diagnosis not present

## 2018-08-09 DIAGNOSIS — Z7982 Long term (current) use of aspirin: Secondary | ICD-10-CM | POA: Diagnosis not present

## 2018-08-09 DIAGNOSIS — Z79899 Other long term (current) drug therapy: Secondary | ICD-10-CM | POA: Diagnosis not present

## 2018-08-09 DIAGNOSIS — I10 Essential (primary) hypertension: Secondary | ICD-10-CM | POA: Diagnosis not present

## 2018-08-09 DIAGNOSIS — M5489 Other dorsalgia: Secondary | ICD-10-CM | POA: Diagnosis not present

## 2018-08-09 DIAGNOSIS — G459 Transient cerebral ischemic attack, unspecified: Secondary | ICD-10-CM | POA: Diagnosis not present

## 2018-08-09 LAB — CBC WITH DIFFERENTIAL/PLATELET
Abs Immature Granulocytes: 0.04 10*3/uL (ref 0.00–0.07)
BASOS PCT: 1 %
Basophils Absolute: 0.1 10*3/uL (ref 0.0–0.1)
Eosinophils Absolute: 0.3 10*3/uL (ref 0.0–0.5)
Eosinophils Relative: 4 %
HEMATOCRIT: 45.3 % (ref 36.0–46.0)
Hemoglobin: 13.7 g/dL (ref 12.0–15.0)
Immature Granulocytes: 0 %
Lymphocytes Relative: 21 %
Lymphs Abs: 1.9 10*3/uL (ref 0.7–4.0)
MCH: 33.7 pg (ref 26.0–34.0)
MCHC: 30.2 g/dL (ref 30.0–36.0)
MCV: 111.3 fL — AB (ref 80.0–100.0)
MONOS PCT: 8 %
Monocytes Absolute: 0.7 10*3/uL (ref 0.1–1.0)
Neutro Abs: 6.1 10*3/uL (ref 1.7–7.7)
Neutrophils Relative %: 66 %
Platelets: 250 10*3/uL (ref 150–400)
RBC: 4.07 MIL/uL (ref 3.87–5.11)
RDW: 12.1 % (ref 11.5–15.5)
WBC: 9.1 10*3/uL (ref 4.0–10.5)
nRBC: 0 % (ref 0.0–0.2)

## 2018-08-09 LAB — LIPASE, BLOOD: LIPASE: 19 U/L (ref 11–51)

## 2018-08-09 LAB — URINALYSIS, ROUTINE W REFLEX MICROSCOPIC
Bilirubin Urine: NEGATIVE
Glucose, UA: NEGATIVE mg/dL
Hgb urine dipstick: NEGATIVE
Ketones, ur: NEGATIVE mg/dL
Nitrite: NEGATIVE
PROTEIN: NEGATIVE mg/dL
Specific Gravity, Urine: 1.009 (ref 1.005–1.030)
pH: 6 (ref 5.0–8.0)

## 2018-08-09 LAB — COMPREHENSIVE METABOLIC PANEL
ALT: 6 U/L (ref 0–44)
AST: 23 U/L (ref 15–41)
Albumin: 3.6 g/dL (ref 3.5–5.0)
Alkaline Phosphatase: 104 U/L (ref 38–126)
Anion gap: 8 (ref 5–15)
BUN: 13 mg/dL (ref 8–23)
CHLORIDE: 101 mmol/L (ref 98–111)
CO2: 29 mmol/L (ref 22–32)
Calcium: 9.6 mg/dL (ref 8.9–10.3)
Creatinine, Ser: 0.81 mg/dL (ref 0.44–1.00)
GFR calc Af Amer: >60 mL/min (ref >60)
Glucose, Bld: 110 mg/dL — ABNORMAL HIGH (ref 70–99)
Potassium: 4.4 mmol/L (ref 3.5–5.1)
Sodium: 138 mmol/L (ref 135–145)
Total Bilirubin: 0.5 mg/dL (ref 0.3–1.2)
Total Protein: 7.7 g/dL (ref 6.5–8.1)

## 2018-08-09 MED ORDER — AMOXICILLIN-POT CLAVULANATE 875-125 MG PO TABS: 1.0000 | ORAL_TABLET | Freq: Two times a day (BID) | ORAL | 0 refills | Status: AC

## 2018-08-09 MED ORDER — AMOXICILLIN-POT CLAVULANATE 875-125 MG PO TABS
1.0000 | ORAL_TABLET | Freq: Once | ORAL | Status: AC
  Administered 2018-08-10: 1 via ORAL
  Filled 2018-08-09: qty 1

## 2018-08-09 MED ORDER — OXYCODONE-ACETAMINOPHEN 5-325 MG PO TABS
2.0000 | ORAL_TABLET | Freq: Once | ORAL | Status: AC
  Administered 2018-08-10: 2 via ORAL
  Filled 2018-08-09 (×2): qty 2

## 2018-08-09 MED ORDER — KETOROLAC TROMETHAMINE 30 MG/ML IJ SOLN
15.0000 mg | Freq: Once | INTRAMUSCULAR | Status: AC
  Administered 2018-08-09: 15 mg via INTRAVENOUS
  Filled 2018-08-09: qty 1

## 2018-08-09 MED ORDER — OXYCODONE-ACETAMINOPHEN 5-325 MG PO TABS: 1.0000 | ORAL_TABLET | Freq: Four times a day (QID) | ORAL | 0 refills | Status: AC | PRN

## 2018-08-09 NOTE — Discharge Instructions (Signed)
1.  Your CT scan has a small area on the right lung base that may represent an area of developing infection.  The radiologic interpretation suggested it is likely to be an area of scarring.  Because you are having pain in this area, you will be placed on an antibiotic. 2.  You need a reassessment by your primary care provider within the next several days. 3.  Return to the emergency department if you develop any associated symptoms like shortness of breath, fever or other concerning symptoms.

## 2018-08-09 NOTE — ED Provider Notes (Signed)
[facility] DEPT Provider Note   CSN: [id number] Arrival date & time: 08/09/18  1920    History   Chief Complaint Chief Complaint  Patient presents with  . Flank Pain    HPI [name] is a 78 y.o. female.     HPI Patient reports that she has been having a burning kind of pain in her right flank.  She reports that wraps around slightly towards the front.  She reports it feels like it is just under her skin.  She denies any other associated symptoms.  [name] had no fever, no cough, no shortness of breath.  She reports that it feels like it could be shingles but she has not had any rash develop with it.  Patient reports she has had multiple back surgeries but this feels different from any of her typical back pain.  She denies any abdominal pain.  She has not had any pain burning urgency with urination.  She denies any pain in her legs.  She denies calf pain.  No history of DVT. Past Medical History:  Diagnosis Date  . Anxiety   . Arthritis    djd  . Blood transfusion without reported diagnosis   . Cataracts, bilateral    immature   . Cellulitis    10/11/11 hospitalized for cellulitis   . Chicken pox   . Chronic back pain    scoliosis/stenosis  . Depression    takes Zoloft daily  . Elbow fracture, left April 2015  . GERD (gastroesophageal reflux disease)    hx pud '98  . Glaucoma   . Hyperlipemia    takes Atorvastatin daily  . Hypertension    takes HCTZ daily  . Insomnia   . Migraine   . Peripheral neuropathy   . 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 Radiculopathy, lumbar region   . Restless leg syndrome    takes Sinemet daily  . TIA (transient ischemic attack)    Possible TIA per 2013 DC summary  . Vertigo     Patient Active Problem List   Diagnosis Date Noted  . Edema 11/06/2017  . Uncomplicated opioid dependence ([place]) 07/20/2016  . Rotator cuff tear arthropathy of right shoulder 07/20/2016  . Nontraumatic  incomplete tear of right rotator cuff 07/17/2016  . Acute pain of right shoulder 06/27/2016  . Impingement syndrome of right shoulder 06/11/2016  . Colon cancer screening 05/17/2016  . Heme positive stool 05/17/2016  . Esophageal ulcer with bleeding 05/17/2016  . Duodenal ulcer 05/17/2016  . Herniated nucleus pulposus, L2-3 right 12/19/2015  . Right foot pain 07/22/2015  . Chest pain, atypical 07/11/2015  . History of drug overdose 07/07/2015  . Frequent falls 07/07/2015  . Hyperlipidemia 07/07/2015  . Essential hypertension 07/07/2015  . Spinal stenosis, lumbar region, with neurogenic claudication 07/07/2015  . Severe episode of recurrent major depressive disorder, with psychotic features ([place]) 07/07/2015  . Visual hallucination 07/01/2015  . Severe episode of recurrent major depressive disorder, without psychotic features ([place])   . Back pain 06/20/2015  . MDD (major depressive disorder), recurrent episode, severe ([place]) 06/16/2015  . Lumbar stenosis 04/19/2015  . Hypokalemia 09/19/2014  . Constipation 09/19/2014  . Acute respiratory failure ([place]) 09/17/2014  . OSA (obstructive sleep apnea) 09/17/2014  . Ankle fracture, lateral malleolus, closed 09/14/2014  . Major depressive disorder, recurrent severe without psychotic features ([place]) 08/07/2014  . Insomnia 08/05/2014  . Hip joint painful on movement  08/05/2014  . Degenerative disc disease, lumbar 08/05/2014  . Severe obesity (BMI >= 40) ([place]) 01/11/2014  . Depression   . Arthritis   . Polyneuropathy in other diseases classified elsewhere ([place]) 04/08/2013  . Restless legs syndrome (RLS) 04/08/2013  . UTI (lower urinary tract infection) 10/14/2011  . Dizziness 10/12/2011  . TIA (transient ischemic attack) 10/12/2011  . GERD (gastroesophageal reflux disease) 10/12/2011    Past Surgical History:  Procedure Laterality Date  . ABDOMINAL HYSTERECTOMY  1975   bil oophorectomy  . APPENDECTOMY    . BACK SURGERY   MANY YRS AGO    laminectomy x3  . CARPAL TUNNEL RELEASE  07/09/2012   Procedure: CARPAL TUNNEL RELEASE;  Surgeon: Magnus Sinning, MD;  Location: [facility];  Service: Orthopedics;  Laterality: Left;  . cervical disc surgery x2  2011  . CHOLECYSTECTOMY  1993  . colonosocpy    . ESOPHAGOGASTRODUODENOSCOPY    . FINGER ARTHROPLASTY  07/09/2012   Procedure: FINGER ARTHROPLASTY;  Surgeon: Magnus Sinning, MD;  Location: [facility];  Service: Orthopedics;  Laterality: Left;  Interposition Arthroplasty CMC Joint Thumb Left   . HEMATOMA EVACUATION  2011   s/p rt cea  . JOINT REPLACEMENT  '01   total knee replacement, LEFT  . left knee arthroscopy  2001  . LIPOMA EXCISION  07/09/2012   Procedure: EXCISION LIPOMA;  Surgeon: Magnus Sinning, MD;  Location: [facility];  Service: Orthopedics;  Laterality: Left;  Excision Lipoma Dorsum Left Wrist   . LUMBAR LAMINECTOMY WITH COFLEX 1 LEVEL Bilateral 04/19/2015   Procedure: LUMBAR TWO-THREE LUMBAR LAMINECTOMY WITH COFLEX ;  Surgeon: Kristeen Miss, MD;  Location: [facility];  Service: Neurosurgery;  Laterality: Bilateral;  Bilateral L23 laminectomy and foraminotomy with coflex  . LUMBAR LAMINECTOMY/DECOMPRESSION MICRODISCECTOMY Right 12/19/2015   Procedure: Right Lumbar two-three  Microdiskectomy;  Surgeon: Kristeen Miss, MD;  Location: [facility];  Service: Neurosurgery;  Laterality: Right;  . ORIF ANKLE FRACTURE Right 09/14/2014   FIBULA   . ORIF ANKLE FRACTURE Right 09/14/2014   Procedure: OPEN REDUCTION INTERNAL FIXATION (ORIF) RIGHT ANKLE FRACTURE/SYNDESMOSIS ;  Surgeon: Wylene Simmer, MD;  Location: [facility];  Service: Orthopedics;  Laterality: Right;  . right knee replacement     09/2011   . rt carotid enarterectomy  2011  . rt knee arthroscopy  '99  . SHOULDER ARTHROSCOPY Right   . TOTAL KNEE ARTHROPLASTY  09/13/2011   Procedure: TOTAL KNEE ARTHROPLASTY;  Surgeon: Magnus Sinning, MD;  Location: [facility];  Service: Orthopedics;  Laterality: Right;     OB History   No  obstetric history on file.      Home Medications    Prior to Admission medications   Medication Sig Start Date End Date Taking? Authorizing Provider  acetaminophen (TYLENOL) 650 MG CR tablet Take 1,300 mg by mouth 2 (two) times daily as needed for pain.    [provider]  amLODipine (NORVASC) 10 MG tablet Take 1 tablet (10 mg total) by mouth at bedtime. 04/11/18   Nche, Charlene Brooke, NP  amoxicillin-clavulanate (AUGMENTIN) 875-125 MG tablet Take 1 tablet by mouth 2 (two) times daily. One po bid x 7 days 08/09/18   Charlesetta Shanks, MD  aspirin EC 325 MG tablet Take 1 tablet (325 mg total) by mouth daily. 07/20/16   [name], [name] L, DO  atorvastatin (LIPITOR) 40 MG tablet Take 1 tablet (40 mg total) by mouth daily at 6 PM. 04/11/18   Nche, Charlene Brooke, NP  cholecalciferol (VITAMIN D) 1000 units tablet Take 1,000 Units by mouth daily.    [provider]  cyclobenzaprine (FLEXERIL) 10 MG tablet Take 10 mg by mouth 3 (three) times daily as needed for muscle spasms. 06/04/18   [provider]  gabapentin (NEURONTIN) 600 MG tablet Take 1 tablet (600 mg total) by mouth 4 (four) times daily. Patient taking differently: Take 600 mg by mouth 4 (four) times daily. With meals and at bedtime 05/05/18   Nche, Charlene Brooke, NP  losartan (COZAAR) 100 MG tablet Take 1 tablet (100 mg total) by mouth daily. 04/11/18   Nche, Charlene Brooke, NP  methocarbamol (ROBAXIN) 500 MG tablet Take 1 tablet (500 mg total) by mouth every 12 (twelve) hours as needed for muscle spasms. Patient not taking: Reported on 06/17/2018 12/23/17   Nche, Charlene Brooke, NP  metoprolol succinate (TOPROL-XL) 100 MG 24 hr tablet TAKE 1 TABLET EVERY DAY, TAKE WITH 50 MG TABLET 04/11/18   Nche, Charlene Brooke, NP  metoprolol succinate (TOPROL-XL) 50 MG 24 hr tablet TAKE 1 TABLET EVERY DAY ALONG WITH 100 MG TABLET 04/11/18   Nche, Charlene Brooke, NP  oxyCODONE-acetaminophen (PERCOCET) 5-325 MG tablet Take 1 tablet by mouth every  6 (six) hours as needed for up to 3 days. 08/09/18 08/12/18  Charlesetta Shanks, MD  pantoprazole (PROTONIX) 20 MG tablet Take 1 tablet (20 mg total) by mouth daily. Patient not taking: Reported on 06/17/2018 04/11/18   Nche, Charlene Brooke, NP  pyridOXINE (VITAMIN B-6) 50 MG tablet Take 50 mg by mouth daily.    [provider]  sertraline (ZOLOFT) 50 MG tablet Take 1 tablet (50 mg total) by mouth daily. 04/11/18   Nche, Charlene Brooke, NP  sodium chloride (OCEAN) 0.65 % SOLN nasal spray Place 1 spray into both nostrils as needed for congestion. 06/28/15   Lindell Spar I, NP  SUMAtriptan (IMITREX) 50 MG tablet TAKE 1 TABLET BY MOUTH AS NEEDED FOR MIGRAINES/HEADACHES. DO NOT EXCEED 2 TABLETS IN 24 HOURS 06/10/18   Nche, Charlene Brooke, NP  potassium chloride 20 MEQ TBCR Take 20 mEq by mouth daily. 08/07/14 08/07/14  Larene Pickett, PA-C    Family History Family History  Problem Relation Age of Onset  . Congestive Heart Failure Father   . Arthritis Father   . Hearing loss Father   . Heart disease Father   . Congestive Heart Failure Sister   . Arthritis Sister   . COPD Sister   . Depression Sister   . Early death Sister   . Hypertension Sister   . Alzheimer's disease Mother   . Arthritis Mother   . Diabetes Brother   . Cancer Brother        adrenal gland  . Testicular cancer Brother   . Arthritis Brother   . Heart disease Brother   . Hyperlipidemia Brother   . Hypertension Brother   . Arthritis Brother   . Cancer Brother        prostate  . Diabetes Brother   . Prostate cancer Brother   . Hypertension Brother   . Early death Maternal Grandmother   . Early death Maternal Grandfather   . Early death Paternal Grandmother   . Early death Paternal Grandfather     Social History Social History   Tobacco Use  . Smoking status: Never Smoker  . Smokeless tobacco: Never Used  Substance Use Topics  . Alcohol use: No    Alcohol/week: 0.0 standard drinks  . Drug use: No  Allergies    Contrast media [iodinated diagnostic agents]; Acyclovir and related; Famciclovir; and Sulfa drugs cross reactors   Review of Systems Review of Systems 10 Systems reviewed and are negative for acute change except as noted in the HPI.   Physical Exam Updated Vital Signs BP (!) 177/68   Pulse 78   Temp 98.4 F (36.9 C) (Oral)   Resp 18   Ht 5\' 5"  (1.651 m)   Wt 99.8 kg   SpO2 96%   BMI 36.61 kg/m   Physical Exam Constitutional:      Appearance: Normal appearance.     Comments: Patient is clinically well in appearance.  [name] alert and nontoxic.  [name] tearful.  HENT:     Mouth/Throat:     Mouth: Mucous membranes are moist.     Pharynx: Oropharynx is clear.  Eyes:     Extraocular Movements: Extraocular movements intact.  Cardiovascular:     Rate and Rhythm: Normal rate and regular rhythm.     Pulses: Normal pulses.     Heart sounds: Normal heart sounds.  Pulmonary:     Effort: Pulmonary effort is normal.     Breath sounds: Normal breath sounds.     Comments: Patient has focus of chest wall tenderness in the posterior right chest wall at approximately ribs 8 9.  No palpable abnormality.  No skin rashes. Chest:     Chest wall: Tenderness present.  Abdominal:     General: There is no distension.     Palpations: Abdomen is soft.     Tenderness: There is no abdominal tenderness. There is no guarding.  Musculoskeletal: Normal range of motion.     Comments: Symmetric 1-2+ peripheral edema of the feet and legs.  Calves are soft and nontender.  Skin condition good.  Skin:    General: Skin is warm and dry.  Neurological:     General: No focal deficit present.     Mental Status: [name] alert and oriented to person, place, and time.     Coordination: Coordination normal.      ED Treatments / Results  Labs (all labs ordered are listed, but only abnormal results are displayed) Labs Reviewed  URINALYSIS, ROUTINE W REFLEX MICROSCOPIC - Abnormal; Notable for the following  components:      Result Value   Color, Urine STRAW (*)    Leukocytes,Ua TRACE (*)    Bacteria, UA RARE (*)    All other components within normal limits  COMPREHENSIVE METABOLIC PANEL - Abnormal; Notable for the following components:   Glucose, Bld 110 (*)    All other components within normal limits  CBC WITH DIFFERENTIAL/PLATELET - Abnormal; Notable for the following components:   MCV 111.3 (*)    All other components within normal limits  URINE CULTURE  LIPASE, BLOOD    EKG None  Radiology Ct Renal Stone Study  Result Date: 08/09/2018 CLINICAL DATA:  Flank pain.  Abdominal pain. EXAM: CT ABDOMEN AND PELVIS WITHOUT CONTRAST TECHNIQUE: Multidetector CT imaging of the abdomen and pelvis was performed following the standard protocol without IV contrast. COMPARISON:  None. FINDINGS: Lower chest: Opacity in the medial right lung base is probably atelectasis. Infiltrate considered less likely. Lung bases are otherwise unremarkable. Hepatobiliary: No focal liver abnormality is seen. Status post cholecystectomy. No biliary dilatation. Pancreas: Unremarkable. No pancreatic ductal dilatation or surrounding inflammatory changes. Spleen: Normal in size without focal abnormality. Adrenals/Urinary Tract: Adrenal glands are unremarkable. Kidneys are normal, without renal calculi,  focal lesion, or hydronephrosis. Bladder is unremarkable. Stomach/Bowel: The stomach, small bowel, and colon are unremarkable. Previous appendectomy. Vascular/Lymphatic: Atherosclerotic changes in the nonaneurysmal aorta. No adenopathy. Reproductive: Status post hysterectomy. No adnexal masses. Other: No abdominal wall hernia or abnormality. No abdominopelvic ascites. Musculoskeletal: No acute or significant osseous findings. IMPRESSION: 1. Opacity in the medial right lung base is favored to represent scar or atelectasis. Infiltrate considered less likely. 2. No cause for flank pain identified. No renal stones or obstruction. 3.  Atherosclerotic changes in the nonaneurysmal abdominal aorta. Electronically Signed   By: Dorise Bullion III M.D   On: 08/09/2018 23:21    Procedures Procedures (including critical care time)  Medications Ordered in ED Medications  ketorolac (TORADOL) 30 MG/ML injection 15 mg (15 mg Intravenous Given 08/09/18 2233)  amoxicillin-clavulanate (AUGMENTIN) 875-125 MG per tablet 1 tablet (1 tablet Oral Given 08/10/18 0010)  oxyCODONE-acetaminophen (PERCOCET/ROXICET) 5-325 MG per tablet 2 tablet (2 tablets Oral Given 08/10/18 0010)     Initial Impression / Assessment and Plan / ED Course  I have reviewed the triage vital signs and the nursing notes.  Pertinent labs & imaging results that were available during my care of the patient were reviewed by me and considered in my medical decision making (see chart for details).       Patient is alert and nontoxic.  No respiratory distress.  She has a focus of thoracic back/flank pain.  Patient denies any associated symptoms.  She reports it feels like the pain is just below her skin and burning in quality.  This sound suspicious for early shingles however no lesions are visible at this time.  CT scan obtained to look for renal stone or pyelonephritis.  A small area of atelectasis versus scarring identified on CT scan with lower suspicion per radiology for infectious etiology.  However with patient having associated pain, will opt to treat with antibiotic for possible early pneumonia.  Patient does not have any lower extremity pain, calf tenderness or shortness of breath to suggest PE.  Patient is recommended for close follow-up with the facility provider.  Return precautions reviewed.  Final Clinical Impressions(s) / ED Diagnoses   Final diagnoses:  Flank pain  Pleurisy    ED Discharge Orders         Ordered    amoxicillin-clavulanate (AUGMENTIN) 875-125 MG tablet  2 times daily     08/09/18 2347    oxyCODONE-acetaminophen (PERCOCET) 5-325 MG tablet   Every 6 hours PRN     08/09/18 2347           Charlesetta Shanks, MD 08/10/18 [phone]

## 2018-08-09 NOTE — ED Triage Notes (Signed)
Pt reports 3 days of right flank pain without out accompanying s/s. Hx of chronic back pain, reports it feels nothing like that because its to the side now.

## 2018-08-09 NOTE — ED Notes (Signed)
Bed: WA17 Expected date:  Expected time:  Means of arrival:  Comments: 78 yo R flank pain

## 2018-08-10 DIAGNOSIS — M2550 Pain in unspecified joint: Secondary | ICD-10-CM | POA: Diagnosis not present

## 2018-08-10 DIAGNOSIS — Z7401 Bed confinement status: Secondary | ICD-10-CM | POA: Diagnosis not present

## 2018-08-10 DIAGNOSIS — R109 Unspecified abdominal pain: Secondary | ICD-10-CM | POA: Diagnosis not present

## 2018-08-10 DIAGNOSIS — J9600 Acute respiratory failure, unspecified whether with hypoxia or hypercapnia: Secondary | ICD-10-CM | POA: Diagnosis not present

## 2018-08-10 DIAGNOSIS — I499 Cardiac arrhythmia, unspecified: Secondary | ICD-10-CM | POA: Diagnosis not present

## 2018-08-10 DIAGNOSIS — R1031 Right lower quadrant pain: Secondary | ICD-10-CM | POA: Diagnosis not present

## 2018-08-11 LAB — URINE CULTURE

## 2018-08-12 ENCOUNTER — Telehealth: Payer: Self-pay

## 2018-08-12 DIAGNOSIS — R0902 Hypoxemia: Secondary | ICD-10-CM | POA: Diagnosis not present

## 2018-08-12 DIAGNOSIS — M6281 Muscle weakness (generalized): Secondary | ICD-10-CM | POA: Diagnosis not present

## 2018-08-12 DIAGNOSIS — R2689 Other abnormalities of gait and mobility: Secondary | ICD-10-CM | POA: Diagnosis not present

## 2018-08-12 DIAGNOSIS — Z79899 Other long term (current) drug therapy: Secondary | ICD-10-CM | POA: Diagnosis not present

## 2018-08-12 DIAGNOSIS — R109 Unspecified abdominal pain: Secondary | ICD-10-CM | POA: Diagnosis not present

## 2018-08-12 DIAGNOSIS — F419 Anxiety disorder, unspecified: Secondary | ICD-10-CM | POA: Diagnosis not present

## 2018-08-12 DIAGNOSIS — R4589 Other symptoms and signs involving emotional state: Secondary | ICD-10-CM | POA: Diagnosis not present

## 2018-08-12 DIAGNOSIS — G894 Chronic pain syndrome: Secondary | ICD-10-CM | POA: Diagnosis not present

## 2018-08-12 DIAGNOSIS — G4700 Insomnia, unspecified: Secondary | ICD-10-CM | POA: Diagnosis not present

## 2018-08-12 NOTE — Telephone Encounter (Signed)
Post ED Visit - Positive Culture Follow-up  Culture report reviewed by antimicrobial stewardship pharmacist: [facility] Team []  Elenor Quinones, Pharm.D. []  Heide Guile, Pharm.D., BCPS AQ-ID []  Parks Neptune, Pharm.D., BCPS []  Alycia Rossetti, Pharm.D., BCPS []  [place], Pharm.D., BCPS, AAHIVP []  Legrand Como, Pharm.D., BCPS, AAHIVP []  Salome Arnt, PharmD, BCPS []  Johnnette Gourd, PharmD, BCPS []  Hughes Better, PharmD, BCPS []  Leeroy Cha, PharmD []  Laqueta Linden, PharmD, BCPS []  Albertina Parr, PharmD  [place] Team []  Leodis Sias, PharmD []  Lindell Spar, PharmD []  Royetta Asal, PharmD []  Graylin Shiver, Rph []  Rema Fendt) Glennon Mac, PharmD []  Arlyn Dunning, PharmD []  Netta Cedars, PharmD []  Dia Sitter, PharmD []  Leone Haven, PharmD []  Gretta Arab, PharmD []  Theodis Shove, PharmD []  Peggyann Juba, PharmD [x]  Reuel Boom, PharmD   Positive urine culture  and no further patient follow-up is required at this time.  Genia Del 08/12/2018, 12:42 PM

## 2018-08-13 ENCOUNTER — Encounter (HOSPITAL_COMMUNITY): Payer: Self-pay | Admitting: Emergency Medicine

## 2018-08-13 ENCOUNTER — Emergency Department (HOSPITAL_COMMUNITY)
Admission: EM | Admit: 2018-08-13 | Discharge: 2018-08-13 | Disposition: A | Discharge status: Skilled Nursing Facility | Payer: Medicare HMO | Attending: Emergency Medicine | Admitting: Emergency Medicine

## 2018-08-13 ENCOUNTER — Other Ambulatory Visit: Payer: Self-pay

## 2018-08-13 DIAGNOSIS — I959 Hypotension, unspecified: Secondary | ICD-10-CM | POA: Diagnosis not present

## 2018-08-13 DIAGNOSIS — I10 Essential (primary) hypertension: Secondary | ICD-10-CM | POA: Insufficient documentation

## 2018-08-13 DIAGNOSIS — R0902 Hypoxemia: Secondary | ICD-10-CM | POA: Diagnosis not present

## 2018-08-13 DIAGNOSIS — Z7982 Long term (current) use of aspirin: Secondary | ICD-10-CM | POA: Insufficient documentation

## 2018-08-13 DIAGNOSIS — B029 Zoster without complications: Secondary | ICD-10-CM | POA: Diagnosis not present

## 2018-08-13 DIAGNOSIS — R1031 Right lower quadrant pain: Secondary | ICD-10-CM | POA: Diagnosis present

## 2018-08-13 DIAGNOSIS — R52 Pain, unspecified: Secondary | ICD-10-CM | POA: Diagnosis not present

## 2018-08-13 DIAGNOSIS — Z79899 Other long term (current) drug therapy: Secondary | ICD-10-CM | POA: Insufficient documentation

## 2018-08-13 DIAGNOSIS — M5489 Other dorsalgia: Secondary | ICD-10-CM | POA: Diagnosis not present

## 2018-08-13 MED ORDER — VALACYCLOVIR HCL 1 G PO TABS: 1000.0000 mg | ORAL_TABLET | Freq: Three times a day (TID) | ORAL | 0 refills | Status: DC

## 2018-08-13 MED ORDER — OXYCODONE HCL 5 MG PO TABS: 2.5000 mg | ORAL_TABLET | ORAL | 0 refills | Status: AC | PRN

## 2018-08-13 MED ORDER — OXYCODONE HCL 5 MG PO TABS: 2.5000 mg | ORAL_TABLET | ORAL | 0 refills | Status: DC | PRN

## 2018-08-13 MED ORDER — VALACYCLOVIR HCL 1 G PO TABS: 1000.0000 mg | ORAL_TABLET | Freq: Three times a day (TID) | ORAL | 0 refills | Status: AC

## 2018-08-13 MED ORDER — OXYCODONE HCL 5 MG PO TABS
5.0000 mg | ORAL_TABLET | Freq: Once | ORAL | Status: AC
  Administered 2018-08-13: 5 mg via ORAL
  Filled 2018-08-13: qty 1

## 2018-08-13 MED ORDER — VALACYCLOVIR HCL 500 MG PO TABS
1000.0000 mg | ORAL_TABLET | Freq: Three times a day (TID) | ORAL | Status: DC
  Administered 2018-08-13: 1000 mg via ORAL
  Filled 2018-08-13: qty 2

## 2018-08-13 NOTE — Discharge Instructions (Signed)
Get help right away if:  The rash is on your face or nose.  You have facial pain, pain around your eye area, or loss of feeling on one side of your face.  You have difficulty seeing.  You have ear pain or have ringing in your ear.  You have a loss of taste.  Your condition gets worse.

## 2018-08-13 NOTE — ED Triage Notes (Signed)
Pt arrived via [facility] Presents with right sided flank pain  ACTIVE SHINGLES.

## 2018-08-13 NOTE — ED Notes (Signed)
[facility] called to pick up patient °

## 2018-08-13 NOTE — ED Provider Notes (Signed)
[facility] DEPT Provider Note   CSN: [id number] Arrival date & time: 08/13/18  1322    History   Chief Complaint Chief Complaint  Patient presents with  . Flank Pain    HPI [name] is a 78 y.o. female with a past medical history of depression, reflux, chickenpox in childhood, chronic back pain, depression, peripheral neuropathy, migraine headaches, and per peripheral vertigo who presents emergency department chief complaint of flank pain.  She presents via EMS from her skilled nursing facility.  The patient was seen in the emergency department on 08/09/2018 with complaint of severe pain in her right flank radiating around to the front which she described as severe burning type of pain.  She states that nothing seems to make it worse or better except for going from sitting to standing.  She denies urinary symptoms, frequency or urgency.  The patient thought that she may have a shingles eruption however had no rash at that time.  She had a negative work-up including CT renal stone and UA.  She was diagnosed with pleurisy.  Today she was seen by her nurse practitioner at the skilled nursing facility with slightly low oxygen saturations and was sent to the emergency department for reevaluation and to rule out pulmonary embolus.     HPI  Past Medical History:  Diagnosis Date  . Anxiety   . Arthritis    djd  . Blood transfusion without reported diagnosis   . Cataracts, bilateral    immature   . Cellulitis    10/11/11 hospitalized for cellulitis   . Chicken pox   . Chronic back pain    scoliosis/stenosis  . Depression    takes Zoloft daily  . Elbow fracture, left April 2015  . GERD (gastroesophageal reflux disease)    hx pud '98  . Glaucoma   . Hyperlipemia    takes Atorvastatin daily  . Hypertension    takes HCTZ daily  . Insomnia   . Migraine   . Peripheral neuropathy   . 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 Radiculopathy,  lumbar region   . Restless leg syndrome    takes Sinemet daily  . TIA (transient ischemic attack)    Possible TIA per 2013 DC summary  . Vertigo     Patient Active Problem List   Diagnosis Date Noted  . Edema 11/06/2017  . Uncomplicated opioid dependence ([place]) 07/20/2016  . Rotator cuff tear arthropathy of right shoulder 07/20/2016  . Nontraumatic incomplete tear of right rotator cuff 07/17/2016  . Acute pain of right shoulder 06/27/2016  . Impingement syndrome of right shoulder 06/11/2016  . Colon cancer screening 05/17/2016  . Heme positive stool 05/17/2016  . Esophageal ulcer with bleeding 05/17/2016  . Duodenal ulcer 05/17/2016  . Herniated nucleus pulposus, L2-3 right 12/19/2015  . Right foot pain 07/22/2015  . Chest pain, atypical 07/11/2015  . History of drug overdose 07/07/2015  . Frequent falls 07/07/2015  . Hyperlipidemia 07/07/2015  . Essential hypertension 07/07/2015  . Spinal stenosis, lumbar region, with neurogenic claudication 07/07/2015  . Severe episode of recurrent major depressive disorder, with psychotic features ([place]) 07/07/2015  . Visual hallucination 07/01/2015  . Severe episode of recurrent major depressive disorder, without psychotic features ([place])   . Back pain 06/20/2015  . MDD (major depressive disorder), recurrent episode, severe ([place]) 06/16/2015  . Lumbar stenosis 04/19/2015  . Hypokalemia 09/19/2014  . Constipation 09/19/2014  . Acute respiratory  failure ([place]) 09/17/2014  . OSA (obstructive sleep apnea) 09/17/2014  . Ankle fracture, lateral malleolus, closed 09/14/2014  . Major depressive disorder, recurrent severe without psychotic features ([place]) 08/07/2014  . Insomnia 08/05/2014  . Hip joint painful on movement 08/05/2014  . Degenerative disc disease, lumbar 08/05/2014  . Severe obesity (BMI >= 40) ([place]) 01/11/2014  . Depression   . Arthritis   . Polyneuropathy in other diseases classified elsewhere ([place]) 04/08/2013  . Restless legs  syndrome ([place]) 04/08/2013  . UTI (lower urinary tract infection) 10/14/2011  . Dizziness 10/12/2011  . TIA (transient ischemic attack) 10/12/2011  . GERD (gastroesophageal reflux disease) 10/12/2011    Past Surgical History:  Procedure Laterality Date  . ABDOMINAL HYSTERECTOMY  1975   bil oophorectomy  . APPENDECTOMY    . BACK SURGERY   MANY YRS AGO   laminectomy x3  . CARPAL TUNNEL RELEASE  07/09/2012   Procedure: CARPAL TUNNEL RELEASE;  Surgeon: Magnus Sinning, MD;  Location: [facility];  Service: Orthopedics;  Laterality: Left;  . cervical disc surgery x2  2011  . CHOLECYSTECTOMY  1993  . colonosocpy    . ESOPHAGOGASTRODUODENOSCOPY    . FINGER ARTHROPLASTY  07/09/2012   Procedure: FINGER ARTHROPLASTY;  Surgeon: Magnus Sinning, MD;  Location: [facility];  Service: Orthopedics;  Laterality: Left;  Interposition Arthroplasty CMC Joint Thumb Left   . HEMATOMA EVACUATION  2011   s/p rt cea  . JOINT REPLACEMENT  '01   total knee replacement, LEFT  . left knee arthroscopy  2001  . LIPOMA EXCISION  07/09/2012   Procedure: EXCISION LIPOMA;  Surgeon: Magnus Sinning, MD;  Location: [facility];  Service: Orthopedics;  Laterality: Left;  Excision Lipoma Dorsum Left Wrist   . LUMBAR LAMINECTOMY WITH COFLEX 1 LEVEL Bilateral 04/19/2015   Procedure: LUMBAR TWO-THREE LUMBAR LAMINECTOMY WITH COFLEX ;  Surgeon: Kristeen Miss, MD;  Location: [facility];  Service: Neurosurgery;  Laterality: Bilateral;  Bilateral L23 laminectomy and foraminotomy with coflex  . LUMBAR LAMINECTOMY/DECOMPRESSION MICRODISCECTOMY Right 12/19/2015   Procedure: Right Lumbar two-three  Microdiskectomy;  Surgeon: Kristeen Miss, MD;  Location: [facility];  Service: Neurosurgery;  Laterality: Right;  . ORIF ANKLE FRACTURE Right 09/14/2014   FIBULA   . ORIF ANKLE FRACTURE Right 09/14/2014   Procedure: OPEN REDUCTION INTERNAL FIXATION (ORIF) RIGHT ANKLE FRACTURE/SYNDESMOSIS ;  Surgeon: Wylene Simmer, MD;  Location: [facility];  Service:  Orthopedics;  Laterality: Right;  . right knee replacement     09/2011   . rt carotid enarterectomy  2011  . rt knee arthroscopy  '99  . SHOULDER ARTHROSCOPY Right   . TOTAL KNEE ARTHROPLASTY  09/13/2011   Procedure: TOTAL KNEE ARTHROPLASTY;  Surgeon: Magnus Sinning, MD;  Location: [facility];  Service: Orthopedics;  Laterality: Right;     OB History   No obstetric history on file.      Home Medications    Prior to Admission medications   Medication Sig Start Date End Date Taking? Authorizing Provider  acetaminophen (TYLENOL) 650 MG CR tablet Take 1,300 mg by mouth 2 (two) times daily as needed for pain.    [provider]  amLODipine (NORVASC) 10 MG tablet Take 1 tablet (10 mg total) by mouth at bedtime. 04/11/18   Nche, Charlene Brooke, NP  amoxicillin-clavulanate (AUGMENTIN) 875-125 MG tablet Take 1 tablet by mouth 2 (two) times daily. One po bid x 7 days 08/09/18   Charlesetta Shanks, MD  aspirin EC 325 MG tablet  Take 1 tablet (325 mg total) by mouth daily. 07/20/16   [name], [name] L, DO  atorvastatin (LIPITOR) 40 MG tablet Take 1 tablet (40 mg total) by mouth daily at 6 PM. 04/11/18   Nche, Charlene Brooke, NP  cholecalciferol (VITAMIN D) 1000 units tablet Take 1,000 Units by mouth daily.    [provider]  cyclobenzaprine (FLEXERIL) 10 MG tablet Take 10 mg by mouth 3 (three) times daily as needed for muscle spasms. 06/04/18   [provider]  gabapentin (NEURONTIN) 600 MG tablet Take 1 tablet (600 mg total) by mouth 4 (four) times daily. Patient taking differently: Take 600 mg by mouth 4 (four) times daily. With meals and at bedtime 05/05/18   Nche, Charlene Brooke, NP  losartan (COZAAR) 100 MG tablet Take 1 tablet (100 mg total) by mouth daily. 04/11/18   Nche, Charlene Brooke, NP  methocarbamol (ROBAXIN) 500 MG tablet Take 1 tablet (500 mg total) by mouth every 12 (twelve) hours as needed for muscle spasms. Patient not taking: Reported on 06/17/2018 12/23/17   Nche,  Charlene Brooke, NP  metoprolol succinate (TOPROL-XL) 100 MG 24 hr tablet TAKE 1 TABLET EVERY DAY, TAKE WITH 50 MG TABLET 04/11/18   Nche, Charlene Brooke, NP  metoprolol succinate (TOPROL-XL) 50 MG 24 hr tablet TAKE 1 TABLET EVERY DAY ALONG WITH 100 MG TABLET 04/11/18   Nche, Charlene Brooke, NP  pantoprazole (PROTONIX) 20 MG tablet Take 1 tablet (20 mg total) by mouth daily. Patient not taking: Reported on 06/17/2018 04/11/18   Nche, Charlene Brooke, NP  pyridOXINE (VITAMIN B-6) 50 MG tablet Take 50 mg by mouth daily.    [provider]  sertraline (ZOLOFT) 50 MG tablet Take 1 tablet (50 mg total) by mouth daily. 04/11/18   Nche, Charlene Brooke, NP  sodium chloride (OCEAN) 0.65 % SOLN nasal spray Place 1 spray into both nostrils as needed for congestion. 06/28/15   Lindell Spar I, NP  SUMAtriptan (IMITREX) 50 MG tablet TAKE 1 TABLET BY MOUTH AS NEEDED FOR MIGRAINES/HEADACHES. DO NOT EXCEED 2 TABLETS IN 24 HOURS 06/10/18   Nche, Charlene Brooke, NP  potassium chloride 20 MEQ TBCR Take 20 mEq by mouth daily. 08/07/14 08/07/14  Larene Pickett, PA-C    Family History Family History  Problem Relation Age of Onset  . Congestive Heart Failure Father   . Arthritis Father   . Hearing loss Father   . Heart disease Father   . Congestive Heart Failure Sister   . Arthritis Sister   . COPD Sister   . Depression Sister   . Early death Sister   . Hypertension Sister   . Alzheimer's disease Mother   . Arthritis Mother   . Diabetes Brother   . Cancer Brother        adrenal gland  . Testicular cancer Brother   . Arthritis Brother   . Heart disease Brother   . Hyperlipidemia Brother   . Hypertension Brother   . Arthritis Brother   . Cancer Brother        prostate  . Diabetes Brother   . Prostate cancer Brother   . Hypertension Brother   . Early death Maternal Grandmother   . Early death Maternal Grandfather   . Early death Paternal Grandmother   . Early death Paternal Grandfather     Social History  Social History   Tobacco Use  . Smoking status: Never Smoker  . Smokeless tobacco: Never Used  Substance Use Topics  .  Alcohol use: No    Alcohol/week: 0.0 standard drinks  . Drug use: No     Allergies   Contrast media [iodinated diagnostic agents] and Sulfa drugs cross reactors   Review of Systems Review of Systems Ten systems reviewed and are negative for acute change, except as noted in the HPI.    Physical Exam Updated Vital Signs BP (!) 146/88 (BP Location: Right Arm)   Pulse 73   Temp 99 F (37.2 C) (Oral)   Resp 18   Ht 5\' 5"  (1.651 m)   Wt 104.3 kg   SpO2 98%   BMI 38.27 kg/m   Physical Exam Vitals signs and nursing note reviewed.  Constitutional:      General: [name] not in acute distress.    Appearance: [name] well-developed. [name] not diaphoretic.     Comments: Appears very uncomfortable  HENT:     Head: Normocephalic and atraumatic.  Eyes:     General: No scleral icterus.    Conjunctiva/sclera: Conjunctivae normal.  Neck:     Musculoskeletal: Normal range of motion.  Cardiovascular:     Rate and Rhythm: Normal rate and regular rhythm.     Heart sounds: Normal heart sounds. No murmur. No friction rub. No gallop.   Pulmonary:     Effort: Pulmonary effort is normal. No respiratory distress.     Breath sounds: Normal breath sounds.  Abdominal:     General: Bowel sounds are normal. There is no distension.     Palpations: Abdomen is soft. There is no mass.     Tenderness: There is no abdominal tenderness. There is no guarding.  Skin:    General: Skin is warm and dry.     Findings: Rash present.       Neurological:     Mental Status: [name] alert and oriented to person, place, and time.  Psychiatric:        Behavior: Behavior normal.        ED Treatments / Results  Labs (all labs ordered are listed, but only abnormal results are displayed) Labs Reviewed - No data to display  EKG None  Radiology No results found.  Procedures  Procedures (including critical care time)  Medications Ordered in ED Medications  oxyCODONE (Oxy IR/ROXICODONE) immediate release tablet 5 mg (has no administration in time range)  valACYclovir (VALTREX) tablet 1,000 mg (has no administration in time range)     Initial Impression / Assessment and Plan / ED Course  I have reviewed the triage vital signs and the nursing notes.  Pertinent labs & imaging results that were available during my care of the patient were reviewed by me and considered in my medical decision making (see chart for details).        Patient with acute herpes zoster break which is likely the cause of her symptoms.  Do not feel that she needs CT angios to rule out PE I think that she is having some low oxygen tachycardia saturation secondary to bracing against the pain radiating around her right chest wall dermatome.  Patient given pain medications with some improvement.  She was also started on Valtrex.  She was seen and shared visit with Dr. Darl Householder who agrees that no further work-up is necessary at this time Be discharged with prescription for oxycodone and Valtrex.  She appears appropriate for return to her facility Final Clinical Impressions(s) / ED Diagnoses   Final diagnoses:  None    ED Discharge Orders  None       Margarita Mail, PA-C 08/13/18 2107    Drenda Freeze, MD 08/15/18 [phone]

## 2018-08-14 DIAGNOSIS — Z79899 Other long term (current) drug therapy: Secondary | ICD-10-CM | POA: Diagnosis not present

## 2018-08-14 DIAGNOSIS — R0902 Hypoxemia: Secondary | ICD-10-CM | POA: Diagnosis not present

## 2018-08-14 DIAGNOSIS — M6281 Muscle weakness (generalized): Secondary | ICD-10-CM | POA: Diagnosis not present

## 2018-08-14 DIAGNOSIS — H8110 Benign paroxysmal vertigo, unspecified ear: Secondary | ICD-10-CM | POA: Diagnosis not present

## 2018-08-14 DIAGNOSIS — B029 Zoster without complications: Secondary | ICD-10-CM | POA: Diagnosis not present

## 2018-08-14 DIAGNOSIS — D519 Vitamin B12 deficiency anemia, unspecified: Secondary | ICD-10-CM | POA: Diagnosis not present

## 2018-08-14 DIAGNOSIS — R2689 Other abnormalities of gait and mobility: Secondary | ICD-10-CM | POA: Diagnosis not present

## 2018-08-15 DIAGNOSIS — M6281 Muscle weakness (generalized): Secondary | ICD-10-CM | POA: Diagnosis not present

## 2018-08-15 DIAGNOSIS — R2689 Other abnormalities of gait and mobility: Secondary | ICD-10-CM | POA: Diagnosis not present

## 2018-08-17 DIAGNOSIS — R2689 Other abnormalities of gait and mobility: Secondary | ICD-10-CM | POA: Diagnosis not present

## 2018-08-17 DIAGNOSIS — M6281 Muscle weakness (generalized): Secondary | ICD-10-CM | POA: Diagnosis not present

## 2018-08-18 DIAGNOSIS — R2689 Other abnormalities of gait and mobility: Secondary | ICD-10-CM | POA: Diagnosis not present

## 2018-08-18 DIAGNOSIS — B029 Zoster without complications: Secondary | ICD-10-CM | POA: Diagnosis not present

## 2018-08-18 DIAGNOSIS — Z6838 Body mass index (BMI) 38.0-38.9, adult: Secondary | ICD-10-CM | POA: Diagnosis not present

## 2018-08-18 DIAGNOSIS — E669 Obesity, unspecified: Secondary | ICD-10-CM | POA: Diagnosis not present

## 2018-08-18 DIAGNOSIS — M6281 Muscle weakness (generalized): Secondary | ICD-10-CM | POA: Diagnosis not present

## 2018-08-18 DIAGNOSIS — R0902 Hypoxemia: Secondary | ICD-10-CM | POA: Diagnosis not present

## 2018-08-28 DIAGNOSIS — B0229 Other postherpetic nervous system involvement: Secondary | ICD-10-CM | POA: Diagnosis not present

## 2018-08-28 DIAGNOSIS — M6281 Muscle weakness (generalized): Secondary | ICD-10-CM | POA: Diagnosis not present

## 2018-08-28 DIAGNOSIS — G629 Polyneuropathy, unspecified: Secondary | ICD-10-CM | POA: Diagnosis not present

## 2018-08-28 DIAGNOSIS — R2689 Other abnormalities of gait and mobility: Secondary | ICD-10-CM | POA: Diagnosis not present

## 2018-08-29 DIAGNOSIS — M6281 Muscle weakness (generalized): Secondary | ICD-10-CM | POA: Diagnosis not present

## 2018-08-29 DIAGNOSIS — R2689 Other abnormalities of gait and mobility: Secondary | ICD-10-CM | POA: Diagnosis not present

## 2018-08-30 DIAGNOSIS — R2689 Other abnormalities of gait and mobility: Secondary | ICD-10-CM | POA: Diagnosis not present

## 2018-08-30 DIAGNOSIS — M6281 Muscle weakness (generalized): Secondary | ICD-10-CM | POA: Diagnosis not present

## 2018-08-31 DIAGNOSIS — R2689 Other abnormalities of gait and mobility: Secondary | ICD-10-CM | POA: Diagnosis not present

## 2018-08-31 DIAGNOSIS — M6281 Muscle weakness (generalized): Secondary | ICD-10-CM | POA: Diagnosis not present

## 2018-09-01 DIAGNOSIS — M6281 Muscle weakness (generalized): Secondary | ICD-10-CM | POA: Diagnosis not present

## 2018-09-01 DIAGNOSIS — R2689 Other abnormalities of gait and mobility: Secondary | ICD-10-CM | POA: Diagnosis not present

## 2018-09-02 DIAGNOSIS — M6281 Muscle weakness (generalized): Secondary | ICD-10-CM | POA: Diagnosis not present

## 2018-09-02 DIAGNOSIS — R2689 Other abnormalities of gait and mobility: Secondary | ICD-10-CM | POA: Diagnosis not present

## 2018-09-04 DIAGNOSIS — S335XXD Sprain of ligaments of lumbar spine, subsequent encounter: Secondary | ICD-10-CM | POA: Diagnosis not present

## 2018-09-04 DIAGNOSIS — R2689 Other abnormalities of gait and mobility: Secondary | ICD-10-CM | POA: Diagnosis not present

## 2018-09-04 DIAGNOSIS — D519 Vitamin B12 deficiency anemia, unspecified: Secondary | ICD-10-CM | POA: Diagnosis not present

## 2018-09-04 DIAGNOSIS — R109 Unspecified abdominal pain: Secondary | ICD-10-CM | POA: Diagnosis not present

## 2018-09-04 DIAGNOSIS — B029 Zoster without complications: Secondary | ICD-10-CM | POA: Diagnosis not present

## 2018-09-04 DIAGNOSIS — M6281 Muscle weakness (generalized): Secondary | ICD-10-CM | POA: Diagnosis not present

## 2018-09-05 DIAGNOSIS — R2689 Other abnormalities of gait and mobility: Secondary | ICD-10-CM | POA: Diagnosis not present

## 2018-09-05 DIAGNOSIS — S335XXD Sprain of ligaments of lumbar spine, subsequent encounter: Secondary | ICD-10-CM | POA: Diagnosis not present

## 2018-09-05 DIAGNOSIS — M6281 Muscle weakness (generalized): Secondary | ICD-10-CM | POA: Diagnosis not present

## 2018-09-06 DIAGNOSIS — S335XXD Sprain of ligaments of lumbar spine, subsequent encounter: Secondary | ICD-10-CM | POA: Diagnosis not present

## 2018-09-06 DIAGNOSIS — R2689 Other abnormalities of gait and mobility: Secondary | ICD-10-CM | POA: Diagnosis not present

## 2018-09-06 DIAGNOSIS — M6281 Muscle weakness (generalized): Secondary | ICD-10-CM | POA: Diagnosis not present

## 2018-09-07 DIAGNOSIS — S335XXD Sprain of ligaments of lumbar spine, subsequent encounter: Secondary | ICD-10-CM | POA: Diagnosis not present

## 2018-09-07 DIAGNOSIS — R2689 Other abnormalities of gait and mobility: Secondary | ICD-10-CM | POA: Diagnosis not present

## 2018-09-07 DIAGNOSIS — M6281 Muscle weakness (generalized): Secondary | ICD-10-CM | POA: Diagnosis not present

## 2018-09-08 DIAGNOSIS — M25571 Pain in right ankle and joints of right foot: Secondary | ICD-10-CM | POA: Diagnosis not present

## 2018-09-08 DIAGNOSIS — W19XXXA Unspecified fall, initial encounter: Secondary | ICD-10-CM | POA: Diagnosis not present

## 2018-09-09 DIAGNOSIS — S335XXD Sprain of ligaments of lumbar spine, subsequent encounter: Secondary | ICD-10-CM | POA: Diagnosis not present

## 2018-09-09 DIAGNOSIS — R2689 Other abnormalities of gait and mobility: Secondary | ICD-10-CM | POA: Diagnosis not present

## 2018-09-09 DIAGNOSIS — M6281 Muscle weakness (generalized): Secondary | ICD-10-CM | POA: Diagnosis not present

## 2018-09-11 DIAGNOSIS — M25571 Pain in right ankle and joints of right foot: Secondary | ICD-10-CM | POA: Diagnosis not present

## 2018-09-11 DIAGNOSIS — R601 Generalized edema: Secondary | ICD-10-CM | POA: Diagnosis not present

## 2018-09-15 DIAGNOSIS — R601 Generalized edema: Secondary | ICD-10-CM | POA: Diagnosis not present

## 2018-09-15 DIAGNOSIS — W19XXXD Unspecified fall, subsequent encounter: Secondary | ICD-10-CM | POA: Diagnosis not present

## 2018-09-15 DIAGNOSIS — Z136 Encounter for screening for cardiovascular disorders: Secondary | ICD-10-CM | POA: Diagnosis not present

## 2018-09-15 DIAGNOSIS — R609 Edema, unspecified: Secondary | ICD-10-CM | POA: Diagnosis not present

## 2018-09-18 DIAGNOSIS — E876 Hypokalemia: Secondary | ICD-10-CM | POA: Diagnosis not present

## 2018-09-18 DIAGNOSIS — E559 Vitamin D deficiency, unspecified: Secondary | ICD-10-CM | POA: Diagnosis not present

## 2018-09-18 DIAGNOSIS — D7589 Other specified diseases of blood and blood-forming organs: Secondary | ICD-10-CM | POA: Diagnosis not present

## 2018-09-18 DIAGNOSIS — R601 Generalized edema: Secondary | ICD-10-CM | POA: Diagnosis not present

## 2018-09-19 DIAGNOSIS — R600 Localized edema: Secondary | ICD-10-CM | POA: Diagnosis not present

## 2018-09-19 DIAGNOSIS — I509 Heart failure, unspecified: Secondary | ICD-10-CM | POA: Diagnosis not present

## 2018-09-22 DIAGNOSIS — E876 Hypokalemia: Secondary | ICD-10-CM | POA: Diagnosis not present

## 2018-09-22 DIAGNOSIS — D519 Vitamin B12 deficiency anemia, unspecified: Secondary | ICD-10-CM | POA: Diagnosis not present

## 2018-09-22 DIAGNOSIS — G894 Chronic pain syndrome: Secondary | ICD-10-CM | POA: Diagnosis not present

## 2018-09-22 DIAGNOSIS — R601 Generalized edema: Secondary | ICD-10-CM | POA: Diagnosis not present

## 2018-09-23 DIAGNOSIS — R4589 Other symptoms and signs involving emotional state: Secondary | ICD-10-CM | POA: Diagnosis not present

## 2018-09-23 DIAGNOSIS — F419 Anxiety disorder, unspecified: Secondary | ICD-10-CM | POA: Diagnosis not present

## 2018-09-23 DIAGNOSIS — G4700 Insomnia, unspecified: Secondary | ICD-10-CM | POA: Diagnosis not present

## 2018-09-25 DIAGNOSIS — R601 Generalized edema: Secondary | ICD-10-CM | POA: Diagnosis not present

## 2018-09-25 DIAGNOSIS — G894 Chronic pain syndrome: Secondary | ICD-10-CM | POA: Diagnosis not present

## 2018-09-25 DIAGNOSIS — E876 Hypokalemia: Secondary | ICD-10-CM | POA: Diagnosis not present

## 2018-10-02 DIAGNOSIS — R601 Generalized edema: Secondary | ICD-10-CM | POA: Diagnosis not present

## 2018-10-02 DIAGNOSIS — R609 Edema, unspecified: Secondary | ICD-10-CM | POA: Diagnosis not present

## 2018-10-02 DIAGNOSIS — R0602 Shortness of breath: Secondary | ICD-10-CM | POA: Diagnosis not present

## 2018-10-02 DIAGNOSIS — I10 Essential (primary) hypertension: Secondary | ICD-10-CM | POA: Diagnosis not present

## 2018-10-02 DIAGNOSIS — R918 Other nonspecific abnormal finding of lung field: Secondary | ICD-10-CM | POA: Diagnosis not present

## 2018-10-06 DIAGNOSIS — I10 Essential (primary) hypertension: Secondary | ICD-10-CM | POA: Diagnosis not present

## 2018-10-06 DIAGNOSIS — R601 Generalized edema: Secondary | ICD-10-CM | POA: Diagnosis not present

## 2018-10-07 DIAGNOSIS — G4700 Insomnia, unspecified: Secondary | ICD-10-CM | POA: Diagnosis not present

## 2018-10-07 DIAGNOSIS — R4589 Other symptoms and signs involving emotional state: Secondary | ICD-10-CM | POA: Diagnosis not present

## 2018-10-07 DIAGNOSIS — F419 Anxiety disorder, unspecified: Secondary | ICD-10-CM | POA: Diagnosis not present

## 2018-10-12 DIAGNOSIS — M542 Cervicalgia: Secondary | ICD-10-CM | POA: Diagnosis not present

## 2018-10-12 DIAGNOSIS — R262 Difficulty in walking, not elsewhere classified: Secondary | ICD-10-CM | POA: Diagnosis not present

## 2018-10-12 DIAGNOSIS — S335XXD Sprain of ligaments of lumbar spine, subsequent encounter: Secondary | ICD-10-CM | POA: Diagnosis not present

## 2018-10-12 DIAGNOSIS — Z9181 History of falling: Secondary | ICD-10-CM | POA: Diagnosis not present

## 2018-10-12 DIAGNOSIS — R279 Unspecified lack of coordination: Secondary | ICD-10-CM | POA: Diagnosis not present

## 2018-10-16 DIAGNOSIS — R279 Unspecified lack of coordination: Secondary | ICD-10-CM | POA: Diagnosis not present

## 2018-10-16 DIAGNOSIS — R262 Difficulty in walking, not elsewhere classified: Secondary | ICD-10-CM | POA: Diagnosis not present

## 2018-10-16 DIAGNOSIS — M542 Cervicalgia: Secondary | ICD-10-CM | POA: Diagnosis not present

## 2018-10-16 DIAGNOSIS — S335XXD Sprain of ligaments of lumbar spine, subsequent encounter: Secondary | ICD-10-CM | POA: Diagnosis not present

## 2018-10-16 DIAGNOSIS — Z9181 History of falling: Secondary | ICD-10-CM | POA: Diagnosis not present

## 2018-10-21 DIAGNOSIS — G4700 Insomnia, unspecified: Secondary | ICD-10-CM | POA: Diagnosis not present

## 2018-10-21 DIAGNOSIS — R4589 Other symptoms and signs involving emotional state: Secondary | ICD-10-CM | POA: Diagnosis not present

## 2018-10-21 DIAGNOSIS — F419 Anxiety disorder, unspecified: Secondary | ICD-10-CM | POA: Diagnosis not present

## 2018-10-23 DIAGNOSIS — G894 Chronic pain syndrome: Secondary | ICD-10-CM | POA: Diagnosis not present

## 2018-10-23 DIAGNOSIS — R601 Generalized edema: Secondary | ICD-10-CM | POA: Diagnosis not present

## 2018-10-23 DIAGNOSIS — R918 Other nonspecific abnormal finding of lung field: Secondary | ICD-10-CM | POA: Diagnosis not present

## 2018-10-23 DIAGNOSIS — I10 Essential (primary) hypertension: Secondary | ICD-10-CM | POA: Diagnosis not present

## 2018-10-30 DIAGNOSIS — G894 Chronic pain syndrome: Secondary | ICD-10-CM | POA: Diagnosis not present

## 2018-10-30 DIAGNOSIS — R079 Chest pain, unspecified: Secondary | ICD-10-CM | POA: Diagnosis not present

## 2018-10-30 DIAGNOSIS — R0989 Other specified symptoms and signs involving the circulatory and respiratory systems: Secondary | ICD-10-CM | POA: Diagnosis not present

## 2018-10-30 DIAGNOSIS — Z765 Malingerer [conscious simulation]: Secondary | ICD-10-CM | POA: Diagnosis not present

## 2018-10-30 DIAGNOSIS — F419 Anxiety disorder, unspecified: Secondary | ICD-10-CM | POA: Diagnosis not present

## 2018-10-30 DIAGNOSIS — R4589 Other symptoms and signs involving emotional state: Secondary | ICD-10-CM | POA: Diagnosis not present

## 2018-10-31 DIAGNOSIS — I5020 Unspecified systolic (congestive) heart failure: Secondary | ICD-10-CM | POA: Diagnosis not present

## 2018-10-31 DIAGNOSIS — I10 Essential (primary) hypertension: Secondary | ICD-10-CM | POA: Diagnosis not present

## 2018-11-02 DIAGNOSIS — R4589 Other symptoms and signs involving emotional state: Secondary | ICD-10-CM | POA: Diagnosis not present

## 2018-11-02 DIAGNOSIS — F419 Anxiety disorder, unspecified: Secondary | ICD-10-CM | POA: Diagnosis not present

## 2018-11-02 DIAGNOSIS — G4700 Insomnia, unspecified: Secondary | ICD-10-CM | POA: Diagnosis not present

## 2018-11-02 DIAGNOSIS — F609 Personality disorder, unspecified: Secondary | ICD-10-CM | POA: Diagnosis not present

## 2018-11-03 DIAGNOSIS — G894 Chronic pain syndrome: Secondary | ICD-10-CM | POA: Diagnosis not present

## 2018-11-03 DIAGNOSIS — R490 Dysphonia: Secondary | ICD-10-CM | POA: Diagnosis not present

## 2018-11-03 DIAGNOSIS — H9202 Otalgia, left ear: Secondary | ICD-10-CM | POA: Diagnosis not present

## 2018-11-03 DIAGNOSIS — R918 Other nonspecific abnormal finding of lung field: Secondary | ICD-10-CM | POA: Diagnosis not present

## 2018-11-04 ENCOUNTER — Telehealth: Payer: Self-pay | Admitting: Nurse Practitioner

## 2018-11-04 ENCOUNTER — Telehealth: Payer: Self-pay | Admitting: Neurology

## 2018-11-04 DIAGNOSIS — Z9181 History of falling: Secondary | ICD-10-CM | POA: Diagnosis not present

## 2018-11-04 DIAGNOSIS — R279 Unspecified lack of coordination: Secondary | ICD-10-CM | POA: Diagnosis not present

## 2018-11-04 DIAGNOSIS — S335XXD Sprain of ligaments of lumbar spine, subsequent encounter: Secondary | ICD-10-CM | POA: Diagnosis not present

## 2018-11-04 DIAGNOSIS — R262 Difficulty in walking, not elsewhere classified: Secondary | ICD-10-CM | POA: Diagnosis not present

## 2018-11-04 DIAGNOSIS — M542 Cervicalgia: Secondary | ICD-10-CM | POA: Diagnosis not present

## 2018-11-04 DIAGNOSIS — R1312 Dysphagia, oropharyngeal phase: Secondary | ICD-10-CM | POA: Diagnosis not present

## 2018-11-04 NOTE — Telephone Encounter (Signed)
I can not make any medication changes while she is under the care of another provider

## 2018-11-04 NOTE — Telephone Encounter (Signed)
[name] please advise.    Copied from [place] [id number]. Topic: General - Other >> Nov 04, 2018  1:04 PM Carolyn Stare wrote:  Pt is now living at [facility] and the doctor  there change her medication and she want Baldo Ash to tell them to change her back to what she was   on    gabapentin (NEURONTIN) 600 MG tablet       SUMAtriptan (IMITREX) 50 MG tablet

## 2018-11-04 NOTE — Telephone Encounter (Signed)
Pt is calling in , she is currently a resident at [facility] , she is calling in very emotional stating her meds are being given to her as prescribed, she states they are giving her Gabapentin 1200mg  3 x a day  Instead of 600mg  4 x a day and they are also breaking her SUMAtriptan in half and giving her 25mg  instead of 50mg . Pt is hysterical and wants information sent so that her meds can be administered properly .  CB# [phone]  [phone] - DON [facility])

## 2018-11-04 NOTE — Telephone Encounter (Signed)
I reviewed the pt's chart and located that we have not seen her since 2016. I looked at the recent refill for her medications at the gabapentin and sumatriptan were last refilled by Wilfred Lacy, NP. I reached out to the pt and advised since it has been almost 4 years since we had an evaluation to contact the NP's office to discuss medication's.  Pt states she is not in need of a f/u with our office at this time and will contact CN, NP's office.

## 2018-11-04 NOTE — Telephone Encounter (Signed)
LVM for the pt to call back.

## 2018-11-05 DIAGNOSIS — R262 Difficulty in walking, not elsewhere classified: Secondary | ICD-10-CM | POA: Diagnosis not present

## 2018-11-05 DIAGNOSIS — M542 Cervicalgia: Secondary | ICD-10-CM | POA: Diagnosis not present

## 2018-11-05 DIAGNOSIS — R1312 Dysphagia, oropharyngeal phase: Secondary | ICD-10-CM | POA: Diagnosis not present

## 2018-11-05 DIAGNOSIS — Z9181 History of falling: Secondary | ICD-10-CM | POA: Diagnosis not present

## 2018-11-05 DIAGNOSIS — S335XXD Sprain of ligaments of lumbar spine, subsequent encounter: Secondary | ICD-10-CM | POA: Diagnosis not present

## 2018-11-05 DIAGNOSIS — R279 Unspecified lack of coordination: Secondary | ICD-10-CM | POA: Diagnosis not present

## 2018-11-06 DIAGNOSIS — G894 Chronic pain syndrome: Secondary | ICD-10-CM | POA: Diagnosis not present

## 2018-11-06 DIAGNOSIS — R0981 Nasal congestion: Secondary | ICD-10-CM | POA: Diagnosis not present

## 2018-11-06 DIAGNOSIS — R229 Localized swelling, mass and lump, unspecified: Secondary | ICD-10-CM | POA: Diagnosis not present

## 2018-11-07 NOTE — Telephone Encounter (Signed)
Left another vm for the pt to cal back, need to inform message below. [place] for triage nurse to give detail message if the pt is call back after hour.

## 2018-11-11 DIAGNOSIS — Z9181 History of falling: Secondary | ICD-10-CM | POA: Diagnosis not present

## 2018-11-11 DIAGNOSIS — R262 Difficulty in walking, not elsewhere classified: Secondary | ICD-10-CM | POA: Diagnosis not present

## 2018-11-11 DIAGNOSIS — M542 Cervicalgia: Secondary | ICD-10-CM | POA: Diagnosis not present

## 2018-11-11 DIAGNOSIS — R279 Unspecified lack of coordination: Secondary | ICD-10-CM | POA: Diagnosis not present

## 2018-11-11 DIAGNOSIS — R1312 Dysphagia, oropharyngeal phase: Secondary | ICD-10-CM | POA: Diagnosis not present

## 2018-11-11 DIAGNOSIS — S335XXD Sprain of ligaments of lumbar spine, subsequent encounter: Secondary | ICD-10-CM | POA: Diagnosis not present

## 2018-11-13 DIAGNOSIS — I10 Essential (primary) hypertension: Secondary | ICD-10-CM | POA: Diagnosis not present

## 2018-11-13 DIAGNOSIS — E559 Vitamin D deficiency, unspecified: Secondary | ICD-10-CM | POA: Diagnosis not present

## 2018-11-13 DIAGNOSIS — R601 Generalized edema: Secondary | ICD-10-CM | POA: Diagnosis not present

## 2018-11-13 DIAGNOSIS — D519 Vitamin B12 deficiency anemia, unspecified: Secondary | ICD-10-CM | POA: Diagnosis not present

## 2018-11-16 DIAGNOSIS — R1312 Dysphagia, oropharyngeal phase: Secondary | ICD-10-CM | POA: Diagnosis not present

## 2018-11-16 DIAGNOSIS — M542 Cervicalgia: Secondary | ICD-10-CM | POA: Diagnosis not present

## 2018-11-16 DIAGNOSIS — R262 Difficulty in walking, not elsewhere classified: Secondary | ICD-10-CM | POA: Diagnosis not present

## 2018-11-16 DIAGNOSIS — S335XXD Sprain of ligaments of lumbar spine, subsequent encounter: Secondary | ICD-10-CM | POA: Diagnosis not present

## 2018-11-16 DIAGNOSIS — Z9181 History of falling: Secondary | ICD-10-CM | POA: Diagnosis not present

## 2018-11-16 DIAGNOSIS — R279 Unspecified lack of coordination: Secondary | ICD-10-CM | POA: Diagnosis not present

## 2018-11-17 DIAGNOSIS — M542 Cervicalgia: Secondary | ICD-10-CM | POA: Diagnosis not present

## 2018-11-17 DIAGNOSIS — R262 Difficulty in walking, not elsewhere classified: Secondary | ICD-10-CM | POA: Diagnosis not present

## 2018-11-17 DIAGNOSIS — R1312 Dysphagia, oropharyngeal phase: Secondary | ICD-10-CM | POA: Diagnosis not present

## 2018-11-17 DIAGNOSIS — Z9181 History of falling: Secondary | ICD-10-CM | POA: Diagnosis not present

## 2018-11-17 DIAGNOSIS — S335XXD Sprain of ligaments of lumbar spine, subsequent encounter: Secondary | ICD-10-CM | POA: Diagnosis not present

## 2018-11-17 DIAGNOSIS — R279 Unspecified lack of coordination: Secondary | ICD-10-CM | POA: Diagnosis not present

## 2018-11-18 DIAGNOSIS — F419 Anxiety disorder, unspecified: Secondary | ICD-10-CM | POA: Diagnosis not present

## 2018-11-18 DIAGNOSIS — G4700 Insomnia, unspecified: Secondary | ICD-10-CM | POA: Diagnosis not present

## 2018-11-18 DIAGNOSIS — R4589 Other symptoms and signs involving emotional state: Secondary | ICD-10-CM | POA: Diagnosis not present

## 2018-11-18 DIAGNOSIS — F609 Personality disorder, unspecified: Secondary | ICD-10-CM | POA: Diagnosis not present

## 2018-11-24 DIAGNOSIS — S335XXD Sprain of ligaments of lumbar spine, subsequent encounter: Secondary | ICD-10-CM | POA: Diagnosis not present

## 2018-11-24 DIAGNOSIS — R279 Unspecified lack of coordination: Secondary | ICD-10-CM | POA: Diagnosis not present

## 2018-11-24 DIAGNOSIS — R1312 Dysphagia, oropharyngeal phase: Secondary | ICD-10-CM | POA: Diagnosis not present

## 2018-11-24 DIAGNOSIS — M542 Cervicalgia: Secondary | ICD-10-CM | POA: Diagnosis not present

## 2018-11-24 DIAGNOSIS — Z9181 History of falling: Secondary | ICD-10-CM | POA: Diagnosis not present

## 2018-11-24 DIAGNOSIS — R262 Difficulty in walking, not elsewhere classified: Secondary | ICD-10-CM | POA: Diagnosis not present

## 2018-11-25 DIAGNOSIS — R262 Difficulty in walking, not elsewhere classified: Secondary | ICD-10-CM | POA: Diagnosis not present

## 2018-11-25 DIAGNOSIS — M542 Cervicalgia: Secondary | ICD-10-CM | POA: Diagnosis not present

## 2018-11-25 DIAGNOSIS — R279 Unspecified lack of coordination: Secondary | ICD-10-CM | POA: Diagnosis not present

## 2018-11-25 DIAGNOSIS — Z9181 History of falling: Secondary | ICD-10-CM | POA: Diagnosis not present

## 2018-11-25 DIAGNOSIS — R1312 Dysphagia, oropharyngeal phase: Secondary | ICD-10-CM | POA: Diagnosis not present

## 2018-11-25 DIAGNOSIS — S335XXD Sprain of ligaments of lumbar spine, subsequent encounter: Secondary | ICD-10-CM | POA: Diagnosis not present

## 2018-11-26 DIAGNOSIS — Z9181 History of falling: Secondary | ICD-10-CM | POA: Diagnosis not present

## 2018-11-26 DIAGNOSIS — M542 Cervicalgia: Secondary | ICD-10-CM | POA: Diagnosis not present

## 2018-11-26 DIAGNOSIS — R262 Difficulty in walking, not elsewhere classified: Secondary | ICD-10-CM | POA: Diagnosis not present

## 2018-11-26 DIAGNOSIS — S335XXD Sprain of ligaments of lumbar spine, subsequent encounter: Secondary | ICD-10-CM | POA: Diagnosis not present

## 2018-11-26 DIAGNOSIS — R1312 Dysphagia, oropharyngeal phase: Secondary | ICD-10-CM | POA: Diagnosis not present

## 2018-11-26 DIAGNOSIS — R279 Unspecified lack of coordination: Secondary | ICD-10-CM | POA: Diagnosis not present

## 2018-11-27 DIAGNOSIS — R1312 Dysphagia, oropharyngeal phase: Secondary | ICD-10-CM | POA: Diagnosis not present

## 2018-11-27 DIAGNOSIS — J329 Chronic sinusitis, unspecified: Secondary | ICD-10-CM | POA: Diagnosis not present

## 2018-11-27 DIAGNOSIS — M542 Cervicalgia: Secondary | ICD-10-CM | POA: Diagnosis not present

## 2018-11-27 DIAGNOSIS — S335XXD Sprain of ligaments of lumbar spine, subsequent encounter: Secondary | ICD-10-CM | POA: Diagnosis not present

## 2018-11-27 DIAGNOSIS — Z9181 History of falling: Secondary | ICD-10-CM | POA: Diagnosis not present

## 2018-11-27 DIAGNOSIS — R262 Difficulty in walking, not elsewhere classified: Secondary | ICD-10-CM | POA: Diagnosis not present

## 2018-11-27 DIAGNOSIS — R279 Unspecified lack of coordination: Secondary | ICD-10-CM | POA: Diagnosis not present

## 2018-11-27 DIAGNOSIS — Z765 Malingerer [conscious simulation]: Secondary | ICD-10-CM | POA: Diagnosis not present

## 2018-11-27 DIAGNOSIS — G894 Chronic pain syndrome: Secondary | ICD-10-CM | POA: Diagnosis not present

## 2018-11-27 DIAGNOSIS — R601 Generalized edema: Secondary | ICD-10-CM | POA: Diagnosis not present

## 2018-11-28 DIAGNOSIS — S335XXD Sprain of ligaments of lumbar spine, subsequent encounter: Secondary | ICD-10-CM | POA: Diagnosis not present

## 2018-11-28 DIAGNOSIS — R1312 Dysphagia, oropharyngeal phase: Secondary | ICD-10-CM | POA: Diagnosis not present

## 2018-11-28 DIAGNOSIS — Z9181 History of falling: Secondary | ICD-10-CM | POA: Diagnosis not present

## 2018-11-28 DIAGNOSIS — R262 Difficulty in walking, not elsewhere classified: Secondary | ICD-10-CM | POA: Diagnosis not present

## 2018-11-28 DIAGNOSIS — M542 Cervicalgia: Secondary | ICD-10-CM | POA: Diagnosis not present

## 2018-11-28 DIAGNOSIS — R279 Unspecified lack of coordination: Secondary | ICD-10-CM | POA: Diagnosis not present

## 2018-11-30 DIAGNOSIS — R1312 Dysphagia, oropharyngeal phase: Secondary | ICD-10-CM | POA: Diagnosis not present

## 2018-11-30 DIAGNOSIS — S335XXD Sprain of ligaments of lumbar spine, subsequent encounter: Secondary | ICD-10-CM | POA: Diagnosis not present

## 2018-11-30 DIAGNOSIS — R262 Difficulty in walking, not elsewhere classified: Secondary | ICD-10-CM | POA: Diagnosis not present

## 2018-11-30 DIAGNOSIS — Z9181 History of falling: Secondary | ICD-10-CM | POA: Diagnosis not present

## 2018-11-30 DIAGNOSIS — R279 Unspecified lack of coordination: Secondary | ICD-10-CM | POA: Diagnosis not present

## 2018-11-30 DIAGNOSIS — M542 Cervicalgia: Secondary | ICD-10-CM | POA: Diagnosis not present

## 2018-12-01 DIAGNOSIS — R4589 Other symptoms and signs involving emotional state: Secondary | ICD-10-CM | POA: Diagnosis not present

## 2018-12-01 DIAGNOSIS — F609 Personality disorder, unspecified: Secondary | ICD-10-CM | POA: Diagnosis not present

## 2018-12-01 DIAGNOSIS — G4700 Insomnia, unspecified: Secondary | ICD-10-CM | POA: Diagnosis not present

## 2018-12-01 DIAGNOSIS — F419 Anxiety disorder, unspecified: Secondary | ICD-10-CM | POA: Diagnosis not present

## 2018-12-03 DIAGNOSIS — R279 Unspecified lack of coordination: Secondary | ICD-10-CM | POA: Diagnosis not present

## 2018-12-03 DIAGNOSIS — S335XXD Sprain of ligaments of lumbar spine, subsequent encounter: Secondary | ICD-10-CM | POA: Diagnosis not present

## 2018-12-03 DIAGNOSIS — R262 Difficulty in walking, not elsewhere classified: Secondary | ICD-10-CM | POA: Diagnosis not present

## 2018-12-04 DIAGNOSIS — R262 Difficulty in walking, not elsewhere classified: Secondary | ICD-10-CM | POA: Diagnosis not present

## 2018-12-04 DIAGNOSIS — S335XXD Sprain of ligaments of lumbar spine, subsequent encounter: Secondary | ICD-10-CM | POA: Diagnosis not present

## 2018-12-04 DIAGNOSIS — R279 Unspecified lack of coordination: Secondary | ICD-10-CM | POA: Diagnosis not present

## 2018-12-05 DIAGNOSIS — R262 Difficulty in walking, not elsewhere classified: Secondary | ICD-10-CM | POA: Diagnosis not present

## 2018-12-05 DIAGNOSIS — S335XXD Sprain of ligaments of lumbar spine, subsequent encounter: Secondary | ICD-10-CM | POA: Diagnosis not present

## 2018-12-05 DIAGNOSIS — R279 Unspecified lack of coordination: Secondary | ICD-10-CM | POA: Diagnosis not present

## 2018-12-08 DIAGNOSIS — S335XXD Sprain of ligaments of lumbar spine, subsequent encounter: Secondary | ICD-10-CM | POA: Diagnosis not present

## 2018-12-08 DIAGNOSIS — R262 Difficulty in walking, not elsewhere classified: Secondary | ICD-10-CM | POA: Diagnosis not present

## 2018-12-08 DIAGNOSIS — R279 Unspecified lack of coordination: Secondary | ICD-10-CM | POA: Diagnosis not present

## 2018-12-09 DIAGNOSIS — R279 Unspecified lack of coordination: Secondary | ICD-10-CM | POA: Diagnosis not present

## 2018-12-09 DIAGNOSIS — S335XXD Sprain of ligaments of lumbar spine, subsequent encounter: Secondary | ICD-10-CM | POA: Diagnosis not present

## 2018-12-09 DIAGNOSIS — R262 Difficulty in walking, not elsewhere classified: Secondary | ICD-10-CM | POA: Diagnosis not present

## 2018-12-11 DIAGNOSIS — S335XXD Sprain of ligaments of lumbar spine, subsequent encounter: Secondary | ICD-10-CM | POA: Diagnosis not present

## 2018-12-11 DIAGNOSIS — R262 Difficulty in walking, not elsewhere classified: Secondary | ICD-10-CM | POA: Diagnosis not present

## 2018-12-11 DIAGNOSIS — R279 Unspecified lack of coordination: Secondary | ICD-10-CM | POA: Diagnosis not present

## 2018-12-16 DIAGNOSIS — R262 Difficulty in walking, not elsewhere classified: Secondary | ICD-10-CM | POA: Diagnosis not present

## 2018-12-16 DIAGNOSIS — F419 Anxiety disorder, unspecified: Secondary | ICD-10-CM | POA: Diagnosis not present

## 2018-12-16 DIAGNOSIS — G4700 Insomnia, unspecified: Secondary | ICD-10-CM | POA: Diagnosis not present

## 2018-12-16 DIAGNOSIS — R4589 Other symptoms and signs involving emotional state: Secondary | ICD-10-CM | POA: Diagnosis not present

## 2018-12-16 DIAGNOSIS — S335XXD Sprain of ligaments of lumbar spine, subsequent encounter: Secondary | ICD-10-CM | POA: Diagnosis not present

## 2018-12-16 DIAGNOSIS — R279 Unspecified lack of coordination: Secondary | ICD-10-CM | POA: Diagnosis not present

## 2018-12-18 DIAGNOSIS — R279 Unspecified lack of coordination: Secondary | ICD-10-CM | POA: Diagnosis not present

## 2018-12-18 DIAGNOSIS — R262 Difficulty in walking, not elsewhere classified: Secondary | ICD-10-CM | POA: Diagnosis not present

## 2018-12-18 DIAGNOSIS — S335XXD Sprain of ligaments of lumbar spine, subsequent encounter: Secondary | ICD-10-CM | POA: Diagnosis not present

## 2018-12-19 DIAGNOSIS — R279 Unspecified lack of coordination: Secondary | ICD-10-CM | POA: Diagnosis not present

## 2018-12-19 DIAGNOSIS — S335XXD Sprain of ligaments of lumbar spine, subsequent encounter: Secondary | ICD-10-CM | POA: Diagnosis not present

## 2018-12-19 DIAGNOSIS — R262 Difficulty in walking, not elsewhere classified: Secondary | ICD-10-CM | POA: Diagnosis not present

## 2018-12-22 DIAGNOSIS — R279 Unspecified lack of coordination: Secondary | ICD-10-CM | POA: Diagnosis not present

## 2018-12-22 DIAGNOSIS — S335XXD Sprain of ligaments of lumbar spine, subsequent encounter: Secondary | ICD-10-CM | POA: Diagnosis not present

## 2018-12-22 DIAGNOSIS — R262 Difficulty in walking, not elsewhere classified: Secondary | ICD-10-CM | POA: Diagnosis not present

## 2019-01-01 DIAGNOSIS — S335XXD Sprain of ligaments of lumbar spine, subsequent encounter: Secondary | ICD-10-CM | POA: Diagnosis not present

## 2019-01-01 DIAGNOSIS — R262 Difficulty in walking, not elsewhere classified: Secondary | ICD-10-CM | POA: Diagnosis not present

## 2019-01-01 DIAGNOSIS — R279 Unspecified lack of coordination: Secondary | ICD-10-CM | POA: Diagnosis not present

## 2019-01-05 DIAGNOSIS — M6281 Muscle weakness (generalized): Secondary | ICD-10-CM | POA: Diagnosis not present

## 2019-01-05 DIAGNOSIS — S335XXD Sprain of ligaments of lumbar spine, subsequent encounter: Secondary | ICD-10-CM | POA: Diagnosis not present

## 2019-01-05 DIAGNOSIS — R2689 Other abnormalities of gait and mobility: Secondary | ICD-10-CM | POA: Diagnosis not present

## 2019-01-05 DIAGNOSIS — R262 Difficulty in walking, not elsewhere classified: Secondary | ICD-10-CM | POA: Diagnosis not present

## 2019-01-05 DIAGNOSIS — R278 Other lack of coordination: Secondary | ICD-10-CM | POA: Diagnosis not present

## 2019-01-07 DIAGNOSIS — E539 Vitamin B deficiency, unspecified: Secondary | ICD-10-CM | POA: Diagnosis not present

## 2019-01-07 DIAGNOSIS — Z79899 Other long term (current) drug therapy: Secondary | ICD-10-CM | POA: Diagnosis not present

## 2019-01-07 DIAGNOSIS — E531 Pyridoxine deficiency: Secondary | ICD-10-CM | POA: Diagnosis not present

## 2019-01-07 DIAGNOSIS — I5020 Unspecified systolic (congestive) heart failure: Secondary | ICD-10-CM | POA: Diagnosis not present

## 2019-01-07 DIAGNOSIS — E559 Vitamin D deficiency, unspecified: Secondary | ICD-10-CM | POA: Diagnosis not present

## 2019-01-07 DIAGNOSIS — D649 Anemia, unspecified: Secondary | ICD-10-CM | POA: Diagnosis not present

## 2019-01-07 DIAGNOSIS — I10 Essential (primary) hypertension: Secondary | ICD-10-CM | POA: Diagnosis not present

## 2019-01-08 DIAGNOSIS — R262 Difficulty in walking, not elsewhere classified: Secondary | ICD-10-CM | POA: Diagnosis not present

## 2019-01-08 DIAGNOSIS — S335XXD Sprain of ligaments of lumbar spine, subsequent encounter: Secondary | ICD-10-CM | POA: Diagnosis not present

## 2019-01-08 DIAGNOSIS — R2689 Other abnormalities of gait and mobility: Secondary | ICD-10-CM | POA: Diagnosis not present

## 2019-01-08 DIAGNOSIS — R278 Other lack of coordination: Secondary | ICD-10-CM | POA: Diagnosis not present

## 2019-01-08 DIAGNOSIS — M6281 Muscle weakness (generalized): Secondary | ICD-10-CM | POA: Diagnosis not present

## 2019-01-11 DIAGNOSIS — R262 Difficulty in walking, not elsewhere classified: Secondary | ICD-10-CM | POA: Diagnosis not present

## 2019-01-11 DIAGNOSIS — M6281 Muscle weakness (generalized): Secondary | ICD-10-CM | POA: Diagnosis not present

## 2019-01-11 DIAGNOSIS — R278 Other lack of coordination: Secondary | ICD-10-CM | POA: Diagnosis not present

## 2019-01-11 DIAGNOSIS — S335XXD Sprain of ligaments of lumbar spine, subsequent encounter: Secondary | ICD-10-CM | POA: Diagnosis not present

## 2019-01-11 DIAGNOSIS — R2689 Other abnormalities of gait and mobility: Secondary | ICD-10-CM | POA: Diagnosis not present

## 2019-01-12 DIAGNOSIS — M549 Dorsalgia, unspecified: Secondary | ICD-10-CM | POA: Diagnosis not present

## 2019-01-12 DIAGNOSIS — S335XXD Sprain of ligaments of lumbar spine, subsequent encounter: Secondary | ICD-10-CM | POA: Diagnosis not present

## 2019-01-12 DIAGNOSIS — R350 Frequency of micturition: Secondary | ICD-10-CM | POA: Diagnosis not present

## 2019-01-12 DIAGNOSIS — R601 Generalized edema: Secondary | ICD-10-CM | POA: Diagnosis not present

## 2019-01-12 DIAGNOSIS — M25552 Pain in left hip: Secondary | ICD-10-CM | POA: Diagnosis not present

## 2019-01-12 DIAGNOSIS — M545 Low back pain: Secondary | ICD-10-CM | POA: Diagnosis not present

## 2019-01-13 DIAGNOSIS — F330 Major depressive disorder, recurrent, mild: Secondary | ICD-10-CM | POA: Diagnosis not present

## 2019-01-13 DIAGNOSIS — R262 Difficulty in walking, not elsewhere classified: Secondary | ICD-10-CM | POA: Diagnosis not present

## 2019-01-13 DIAGNOSIS — R2689 Other abnormalities of gait and mobility: Secondary | ICD-10-CM | POA: Diagnosis not present

## 2019-01-13 DIAGNOSIS — S335XXD Sprain of ligaments of lumbar spine, subsequent encounter: Secondary | ICD-10-CM | POA: Diagnosis not present

## 2019-01-13 DIAGNOSIS — F4321 Adjustment disorder with depressed mood: Secondary | ICD-10-CM | POA: Diagnosis not present

## 2019-01-13 DIAGNOSIS — R278 Other lack of coordination: Secondary | ICD-10-CM | POA: Diagnosis not present

## 2019-01-13 DIAGNOSIS — M6281 Muscle weakness (generalized): Secondary | ICD-10-CM | POA: Diagnosis not present

## 2019-01-14 DIAGNOSIS — R2689 Other abnormalities of gait and mobility: Secondary | ICD-10-CM | POA: Diagnosis not present

## 2019-01-14 DIAGNOSIS — R278 Other lack of coordination: Secondary | ICD-10-CM | POA: Diagnosis not present

## 2019-01-14 DIAGNOSIS — R262 Difficulty in walking, not elsewhere classified: Secondary | ICD-10-CM | POA: Diagnosis not present

## 2019-01-14 DIAGNOSIS — M6281 Muscle weakness (generalized): Secondary | ICD-10-CM | POA: Diagnosis not present

## 2019-01-14 DIAGNOSIS — S335XXD Sprain of ligaments of lumbar spine, subsequent encounter: Secondary | ICD-10-CM | POA: Diagnosis not present

## 2019-01-16 DIAGNOSIS — E559 Vitamin D deficiency, unspecified: Secondary | ICD-10-CM | POA: Diagnosis not present

## 2019-01-16 DIAGNOSIS — E531 Pyridoxine deficiency: Secondary | ICD-10-CM | POA: Diagnosis not present

## 2019-01-16 DIAGNOSIS — D519 Vitamin B12 deficiency anemia, unspecified: Secondary | ICD-10-CM | POA: Diagnosis not present

## 2019-01-16 DIAGNOSIS — R739 Hyperglycemia, unspecified: Secondary | ICD-10-CM | POA: Diagnosis not present

## 2019-01-17 DIAGNOSIS — M6281 Muscle weakness (generalized): Secondary | ICD-10-CM | POA: Diagnosis not present

## 2019-01-17 DIAGNOSIS — R262 Difficulty in walking, not elsewhere classified: Secondary | ICD-10-CM | POA: Diagnosis not present

## 2019-01-17 DIAGNOSIS — R278 Other lack of coordination: Secondary | ICD-10-CM | POA: Diagnosis not present

## 2019-01-17 DIAGNOSIS — R2689 Other abnormalities of gait and mobility: Secondary | ICD-10-CM | POA: Diagnosis not present

## 2019-01-17 DIAGNOSIS — S335XXD Sprain of ligaments of lumbar spine, subsequent encounter: Secondary | ICD-10-CM | POA: Diagnosis not present

## 2019-01-20 DIAGNOSIS — F419 Anxiety disorder, unspecified: Secondary | ICD-10-CM | POA: Diagnosis not present

## 2019-01-20 DIAGNOSIS — G4700 Insomnia, unspecified: Secondary | ICD-10-CM | POA: Diagnosis not present

## 2019-01-20 DIAGNOSIS — R739 Hyperglycemia, unspecified: Secondary | ICD-10-CM | POA: Diagnosis not present

## 2019-01-20 DIAGNOSIS — F4321 Adjustment disorder with depressed mood: Secondary | ICD-10-CM | POA: Diagnosis not present

## 2019-01-20 DIAGNOSIS — F330 Major depressive disorder, recurrent, mild: Secondary | ICD-10-CM | POA: Diagnosis not present

## 2019-01-20 DIAGNOSIS — Z79899 Other long term (current) drug therapy: Secondary | ICD-10-CM | POA: Diagnosis not present

## 2019-01-21 DIAGNOSIS — E1142 Type 2 diabetes mellitus with diabetic polyneuropathy: Secondary | ICD-10-CM | POA: Diagnosis not present

## 2019-01-21 DIAGNOSIS — E785 Hyperlipidemia, unspecified: Secondary | ICD-10-CM | POA: Diagnosis not present

## 2019-01-21 DIAGNOSIS — M549 Dorsalgia, unspecified: Secondary | ICD-10-CM | POA: Diagnosis not present

## 2019-01-27 DIAGNOSIS — F330 Major depressive disorder, recurrent, mild: Secondary | ICD-10-CM | POA: Diagnosis not present

## 2019-01-27 DIAGNOSIS — F4321 Adjustment disorder with depressed mood: Secondary | ICD-10-CM | POA: Diagnosis not present

## 2019-02-03 DIAGNOSIS — F330 Major depressive disorder, recurrent, mild: Secondary | ICD-10-CM | POA: Diagnosis not present

## 2019-02-03 DIAGNOSIS — F419 Anxiety disorder, unspecified: Secondary | ICD-10-CM | POA: Diagnosis not present

## 2019-02-03 DIAGNOSIS — M519 Unspecified thoracic, thoracolumbar and lumbosacral intervertebral disc disorder: Secondary | ICD-10-CM | POA: Diagnosis not present

## 2019-02-03 DIAGNOSIS — I10 Essential (primary) hypertension: Secondary | ICD-10-CM | POA: Diagnosis not present

## 2019-02-03 DIAGNOSIS — M6281 Muscle weakness (generalized): Secondary | ICD-10-CM | POA: Diagnosis not present

## 2019-02-03 DIAGNOSIS — S335XXD Sprain of ligaments of lumbar spine, subsequent encounter: Secondary | ICD-10-CM | POA: Diagnosis not present

## 2019-02-03 DIAGNOSIS — G629 Polyneuropathy, unspecified: Secondary | ICD-10-CM | POA: Diagnosis not present

## 2019-02-03 DIAGNOSIS — R2681 Unsteadiness on feet: Secondary | ICD-10-CM | POA: Diagnosis not present

## 2019-02-03 DIAGNOSIS — R1312 Dysphagia, oropharyngeal phase: Secondary | ICD-10-CM | POA: Diagnosis not present

## 2019-02-03 DIAGNOSIS — R262 Difficulty in walking, not elsewhere classified: Secondary | ICD-10-CM | POA: Diagnosis not present

## 2019-02-03 DIAGNOSIS — G4700 Insomnia, unspecified: Secondary | ICD-10-CM | POA: Diagnosis not present

## 2019-02-03 DIAGNOSIS — Z741 Need for assistance with personal care: Secondary | ICD-10-CM | POA: Diagnosis not present

## 2019-02-04 DIAGNOSIS — F4321 Adjustment disorder with depressed mood: Secondary | ICD-10-CM | POA: Diagnosis not present

## 2019-02-04 DIAGNOSIS — M25572 Pain in left ankle and joints of left foot: Secondary | ICD-10-CM | POA: Diagnosis not present

## 2019-02-04 DIAGNOSIS — M542 Cervicalgia: Secondary | ICD-10-CM | POA: Diagnosis not present

## 2019-02-04 DIAGNOSIS — W19XXXA Unspecified fall, initial encounter: Secondary | ICD-10-CM | POA: Diagnosis not present

## 2019-02-04 DIAGNOSIS — F330 Major depressive disorder, recurrent, mild: Secondary | ICD-10-CM | POA: Diagnosis not present

## 2019-02-05 DIAGNOSIS — G629 Polyneuropathy, unspecified: Secondary | ICD-10-CM | POA: Diagnosis not present

## 2019-02-05 DIAGNOSIS — M542 Cervicalgia: Secondary | ICD-10-CM | POA: Diagnosis not present

## 2019-02-05 DIAGNOSIS — R2681 Unsteadiness on feet: Secondary | ICD-10-CM | POA: Diagnosis not present

## 2019-02-05 DIAGNOSIS — Z741 Need for assistance with personal care: Secondary | ICD-10-CM | POA: Diagnosis not present

## 2019-02-05 DIAGNOSIS — M25572 Pain in left ankle and joints of left foot: Secondary | ICD-10-CM | POA: Diagnosis not present

## 2019-02-05 DIAGNOSIS — M6281 Muscle weakness (generalized): Secondary | ICD-10-CM | POA: Diagnosis not present

## 2019-02-05 DIAGNOSIS — S335XXD Sprain of ligaments of lumbar spine, subsequent encounter: Secondary | ICD-10-CM | POA: Diagnosis not present

## 2019-02-05 DIAGNOSIS — R1312 Dysphagia, oropharyngeal phase: Secondary | ICD-10-CM | POA: Diagnosis not present

## 2019-02-05 DIAGNOSIS — I10 Essential (primary) hypertension: Secondary | ICD-10-CM | POA: Diagnosis not present

## 2019-02-05 DIAGNOSIS — M519 Unspecified thoracic, thoracolumbar and lumbosacral intervertebral disc disorder: Secondary | ICD-10-CM | POA: Diagnosis not present

## 2019-02-05 DIAGNOSIS — R601 Generalized edema: Secondary | ICD-10-CM | POA: Diagnosis not present

## 2019-02-05 DIAGNOSIS — R262 Difficulty in walking, not elsewhere classified: Secondary | ICD-10-CM | POA: Diagnosis not present

## 2019-02-05 DIAGNOSIS — Z9181 History of falling: Secondary | ICD-10-CM | POA: Diagnosis not present

## 2019-02-07 DIAGNOSIS — G629 Polyneuropathy, unspecified: Secondary | ICD-10-CM | POA: Diagnosis not present

## 2019-02-07 DIAGNOSIS — R2681 Unsteadiness on feet: Secondary | ICD-10-CM | POA: Diagnosis not present

## 2019-02-07 DIAGNOSIS — S335XXD Sprain of ligaments of lumbar spine, subsequent encounter: Secondary | ICD-10-CM | POA: Diagnosis not present

## 2019-02-07 DIAGNOSIS — M519 Unspecified thoracic, thoracolumbar and lumbosacral intervertebral disc disorder: Secondary | ICD-10-CM | POA: Diagnosis not present

## 2019-02-07 DIAGNOSIS — R1312 Dysphagia, oropharyngeal phase: Secondary | ICD-10-CM | POA: Diagnosis not present

## 2019-02-07 DIAGNOSIS — M6281 Muscle weakness (generalized): Secondary | ICD-10-CM | POA: Diagnosis not present

## 2019-02-07 DIAGNOSIS — R262 Difficulty in walking, not elsewhere classified: Secondary | ICD-10-CM | POA: Diagnosis not present

## 2019-02-07 DIAGNOSIS — I10 Essential (primary) hypertension: Secondary | ICD-10-CM | POA: Diagnosis not present

## 2019-02-07 DIAGNOSIS — Z741 Need for assistance with personal care: Secondary | ICD-10-CM | POA: Diagnosis not present

## 2019-02-08 DIAGNOSIS — M519 Unspecified thoracic, thoracolumbar and lumbosacral intervertebral disc disorder: Secondary | ICD-10-CM | POA: Diagnosis not present

## 2019-02-08 DIAGNOSIS — R2681 Unsteadiness on feet: Secondary | ICD-10-CM | POA: Diagnosis not present

## 2019-02-08 DIAGNOSIS — R1312 Dysphagia, oropharyngeal phase: Secondary | ICD-10-CM | POA: Diagnosis not present

## 2019-02-08 DIAGNOSIS — G629 Polyneuropathy, unspecified: Secondary | ICD-10-CM | POA: Diagnosis not present

## 2019-02-08 DIAGNOSIS — Z741 Need for assistance with personal care: Secondary | ICD-10-CM | POA: Diagnosis not present

## 2019-02-08 DIAGNOSIS — S335XXD Sprain of ligaments of lumbar spine, subsequent encounter: Secondary | ICD-10-CM | POA: Diagnosis not present

## 2019-02-08 DIAGNOSIS — I10 Essential (primary) hypertension: Secondary | ICD-10-CM | POA: Diagnosis not present

## 2019-02-08 DIAGNOSIS — M6281 Muscle weakness (generalized): Secondary | ICD-10-CM | POA: Diagnosis not present

## 2019-02-08 DIAGNOSIS — R262 Difficulty in walking, not elsewhere classified: Secondary | ICD-10-CM | POA: Diagnosis not present

## 2019-02-09 DIAGNOSIS — S335XXD Sprain of ligaments of lumbar spine, subsequent encounter: Secondary | ICD-10-CM | POA: Diagnosis not present

## 2019-02-09 DIAGNOSIS — I10 Essential (primary) hypertension: Secondary | ICD-10-CM | POA: Diagnosis not present

## 2019-02-09 DIAGNOSIS — R2681 Unsteadiness on feet: Secondary | ICD-10-CM | POA: Diagnosis not present

## 2019-02-09 DIAGNOSIS — Z741 Need for assistance with personal care: Secondary | ICD-10-CM | POA: Diagnosis not present

## 2019-02-09 DIAGNOSIS — R1312 Dysphagia, oropharyngeal phase: Secondary | ICD-10-CM | POA: Diagnosis not present

## 2019-02-09 DIAGNOSIS — R262 Difficulty in walking, not elsewhere classified: Secondary | ICD-10-CM | POA: Diagnosis not present

## 2019-02-09 DIAGNOSIS — M519 Unspecified thoracic, thoracolumbar and lumbosacral intervertebral disc disorder: Secondary | ICD-10-CM | POA: Diagnosis not present

## 2019-02-09 DIAGNOSIS — M6281 Muscle weakness (generalized): Secondary | ICD-10-CM | POA: Diagnosis not present

## 2019-02-09 DIAGNOSIS — G629 Polyneuropathy, unspecified: Secondary | ICD-10-CM | POA: Diagnosis not present

## 2019-02-10 DIAGNOSIS — Z741 Need for assistance with personal care: Secondary | ICD-10-CM | POA: Diagnosis not present

## 2019-02-10 DIAGNOSIS — G629 Polyneuropathy, unspecified: Secondary | ICD-10-CM | POA: Diagnosis not present

## 2019-02-10 DIAGNOSIS — R1312 Dysphagia, oropharyngeal phase: Secondary | ICD-10-CM | POA: Diagnosis not present

## 2019-02-10 DIAGNOSIS — R262 Difficulty in walking, not elsewhere classified: Secondary | ICD-10-CM | POA: Diagnosis not present

## 2019-02-10 DIAGNOSIS — M519 Unspecified thoracic, thoracolumbar and lumbosacral intervertebral disc disorder: Secondary | ICD-10-CM | POA: Diagnosis not present

## 2019-02-10 DIAGNOSIS — M6281 Muscle weakness (generalized): Secondary | ICD-10-CM | POA: Diagnosis not present

## 2019-02-10 DIAGNOSIS — I10 Essential (primary) hypertension: Secondary | ICD-10-CM | POA: Diagnosis not present

## 2019-02-10 DIAGNOSIS — S335XXD Sprain of ligaments of lumbar spine, subsequent encounter: Secondary | ICD-10-CM | POA: Diagnosis not present

## 2019-02-10 DIAGNOSIS — R2681 Unsteadiness on feet: Secondary | ICD-10-CM | POA: Diagnosis not present

## 2019-02-11 DIAGNOSIS — I10 Essential (primary) hypertension: Secondary | ICD-10-CM | POA: Diagnosis not present

## 2019-02-11 DIAGNOSIS — S335XXD Sprain of ligaments of lumbar spine, subsequent encounter: Secondary | ICD-10-CM | POA: Diagnosis not present

## 2019-02-11 DIAGNOSIS — R262 Difficulty in walking, not elsewhere classified: Secondary | ICD-10-CM | POA: Diagnosis not present

## 2019-02-11 DIAGNOSIS — R2681 Unsteadiness on feet: Secondary | ICD-10-CM | POA: Diagnosis not present

## 2019-02-11 DIAGNOSIS — G629 Polyneuropathy, unspecified: Secondary | ICD-10-CM | POA: Diagnosis not present

## 2019-02-11 DIAGNOSIS — M6281 Muscle weakness (generalized): Secondary | ICD-10-CM | POA: Diagnosis not present

## 2019-02-11 DIAGNOSIS — Z741 Need for assistance with personal care: Secondary | ICD-10-CM | POA: Diagnosis not present

## 2019-02-11 DIAGNOSIS — R1312 Dysphagia, oropharyngeal phase: Secondary | ICD-10-CM | POA: Diagnosis not present

## 2019-02-11 DIAGNOSIS — M519 Unspecified thoracic, thoracolumbar and lumbosacral intervertebral disc disorder: Secondary | ICD-10-CM | POA: Diagnosis not present

## 2019-02-12 DIAGNOSIS — M79675 Pain in left toe(s): Secondary | ICD-10-CM | POA: Diagnosis not present

## 2019-02-12 DIAGNOSIS — G894 Chronic pain syndrome: Secondary | ICD-10-CM | POA: Diagnosis not present

## 2019-02-16 DIAGNOSIS — E531 Pyridoxine deficiency: Secondary | ICD-10-CM | POA: Diagnosis not present

## 2019-02-17 DIAGNOSIS — F330 Major depressive disorder, recurrent, mild: Secondary | ICD-10-CM | POA: Diagnosis not present

## 2019-02-17 DIAGNOSIS — U071 COVID-19: Secondary | ICD-10-CM | POA: Diagnosis not present

## 2019-02-19 DIAGNOSIS — J0191 Acute recurrent sinusitis, unspecified: Secondary | ICD-10-CM | POA: Diagnosis not present

## 2019-02-19 DIAGNOSIS — R601 Generalized edema: Secondary | ICD-10-CM | POA: Diagnosis not present

## 2019-02-23 DIAGNOSIS — U071 COVID-19: Secondary | ICD-10-CM | POA: Diagnosis not present

## 2019-02-24 DIAGNOSIS — G629 Polyneuropathy, unspecified: Secondary | ICD-10-CM | POA: Diagnosis not present

## 2019-02-24 DIAGNOSIS — M6281 Muscle weakness (generalized): Secondary | ICD-10-CM | POA: Diagnosis not present

## 2019-02-24 DIAGNOSIS — S335XXD Sprain of ligaments of lumbar spine, subsequent encounter: Secondary | ICD-10-CM | POA: Diagnosis not present

## 2019-02-24 DIAGNOSIS — Z741 Need for assistance with personal care: Secondary | ICD-10-CM | POA: Diagnosis not present

## 2019-02-24 DIAGNOSIS — R2681 Unsteadiness on feet: Secondary | ICD-10-CM | POA: Diagnosis not present

## 2019-02-24 DIAGNOSIS — R262 Difficulty in walking, not elsewhere classified: Secondary | ICD-10-CM | POA: Diagnosis not present

## 2019-02-24 DIAGNOSIS — M519 Unspecified thoracic, thoracolumbar and lumbosacral intervertebral disc disorder: Secondary | ICD-10-CM | POA: Diagnosis not present

## 2019-02-24 DIAGNOSIS — I10 Essential (primary) hypertension: Secondary | ICD-10-CM | POA: Diagnosis not present

## 2019-02-24 DIAGNOSIS — R1312 Dysphagia, oropharyngeal phase: Secondary | ICD-10-CM | POA: Diagnosis not present

## 2019-02-25 DIAGNOSIS — F330 Major depressive disorder, recurrent, mild: Secondary | ICD-10-CM | POA: Diagnosis not present

## 2019-02-28 DIAGNOSIS — R2681 Unsteadiness on feet: Secondary | ICD-10-CM | POA: Diagnosis not present

## 2019-02-28 DIAGNOSIS — M6281 Muscle weakness (generalized): Secondary | ICD-10-CM | POA: Diagnosis not present

## 2019-02-28 DIAGNOSIS — S335XXD Sprain of ligaments of lumbar spine, subsequent encounter: Secondary | ICD-10-CM | POA: Diagnosis not present

## 2019-02-28 DIAGNOSIS — R262 Difficulty in walking, not elsewhere classified: Secondary | ICD-10-CM | POA: Diagnosis not present

## 2019-02-28 DIAGNOSIS — G629 Polyneuropathy, unspecified: Secondary | ICD-10-CM | POA: Diagnosis not present

## 2019-02-28 DIAGNOSIS — M519 Unspecified thoracic, thoracolumbar and lumbosacral intervertebral disc disorder: Secondary | ICD-10-CM | POA: Diagnosis not present

## 2019-02-28 DIAGNOSIS — Z741 Need for assistance with personal care: Secondary | ICD-10-CM | POA: Diagnosis not present

## 2019-02-28 DIAGNOSIS — R1312 Dysphagia, oropharyngeal phase: Secondary | ICD-10-CM | POA: Diagnosis not present

## 2019-02-28 DIAGNOSIS — I10 Essential (primary) hypertension: Secondary | ICD-10-CM | POA: Diagnosis not present

## 2019-03-02 DIAGNOSIS — R2681 Unsteadiness on feet: Secondary | ICD-10-CM | POA: Diagnosis not present

## 2019-03-02 DIAGNOSIS — G629 Polyneuropathy, unspecified: Secondary | ICD-10-CM | POA: Diagnosis not present

## 2019-03-02 DIAGNOSIS — Z741 Need for assistance with personal care: Secondary | ICD-10-CM | POA: Diagnosis not present

## 2019-03-02 DIAGNOSIS — R262 Difficulty in walking, not elsewhere classified: Secondary | ICD-10-CM | POA: Diagnosis not present

## 2019-03-02 DIAGNOSIS — M6281 Muscle weakness (generalized): Secondary | ICD-10-CM | POA: Diagnosis not present

## 2019-03-02 DIAGNOSIS — R1312 Dysphagia, oropharyngeal phase: Secondary | ICD-10-CM | POA: Diagnosis not present

## 2019-03-02 DIAGNOSIS — M519 Unspecified thoracic, thoracolumbar and lumbosacral intervertebral disc disorder: Secondary | ICD-10-CM | POA: Diagnosis not present

## 2019-03-02 DIAGNOSIS — I10 Essential (primary) hypertension: Secondary | ICD-10-CM | POA: Diagnosis not present

## 2019-03-02 DIAGNOSIS — S335XXD Sprain of ligaments of lumbar spine, subsequent encounter: Secondary | ICD-10-CM | POA: Diagnosis not present

## 2019-03-04 DIAGNOSIS — F330 Major depressive disorder, recurrent, mild: Secondary | ICD-10-CM | POA: Diagnosis not present

## 2019-03-04 DIAGNOSIS — S335XXD Sprain of ligaments of lumbar spine, subsequent encounter: Secondary | ICD-10-CM | POA: Diagnosis not present

## 2019-03-04 DIAGNOSIS — Z741 Need for assistance with personal care: Secondary | ICD-10-CM | POA: Diagnosis not present

## 2019-03-04 DIAGNOSIS — R2681 Unsteadiness on feet: Secondary | ICD-10-CM | POA: Diagnosis not present

## 2019-03-04 DIAGNOSIS — I10 Essential (primary) hypertension: Secondary | ICD-10-CM | POA: Diagnosis not present

## 2019-03-04 DIAGNOSIS — R262 Difficulty in walking, not elsewhere classified: Secondary | ICD-10-CM | POA: Diagnosis not present

## 2019-03-04 DIAGNOSIS — G629 Polyneuropathy, unspecified: Secondary | ICD-10-CM | POA: Diagnosis not present

## 2019-03-04 DIAGNOSIS — R1312 Dysphagia, oropharyngeal phase: Secondary | ICD-10-CM | POA: Diagnosis not present

## 2019-03-04 DIAGNOSIS — M519 Unspecified thoracic, thoracolumbar and lumbosacral intervertebral disc disorder: Secondary | ICD-10-CM | POA: Diagnosis not present

## 2019-03-04 DIAGNOSIS — M6281 Muscle weakness (generalized): Secondary | ICD-10-CM | POA: Diagnosis not present

## 2019-03-05 DIAGNOSIS — S335XXD Sprain of ligaments of lumbar spine, subsequent encounter: Secondary | ICD-10-CM | POA: Diagnosis not present

## 2019-03-05 DIAGNOSIS — I10 Essential (primary) hypertension: Secondary | ICD-10-CM | POA: Diagnosis not present

## 2019-03-05 DIAGNOSIS — Z741 Need for assistance with personal care: Secondary | ICD-10-CM | POA: Diagnosis not present

## 2019-03-05 DIAGNOSIS — R262 Difficulty in walking, not elsewhere classified: Secondary | ICD-10-CM | POA: Diagnosis not present

## 2019-03-05 DIAGNOSIS — M519 Unspecified thoracic, thoracolumbar and lumbosacral intervertebral disc disorder: Secondary | ICD-10-CM | POA: Diagnosis not present

## 2019-03-05 DIAGNOSIS — R1312 Dysphagia, oropharyngeal phase: Secondary | ICD-10-CM | POA: Diagnosis not present

## 2019-03-05 DIAGNOSIS — R2681 Unsteadiness on feet: Secondary | ICD-10-CM | POA: Diagnosis not present

## 2019-03-05 DIAGNOSIS — M6281 Muscle weakness (generalized): Secondary | ICD-10-CM | POA: Diagnosis not present

## 2019-03-05 DIAGNOSIS — G629 Polyneuropathy, unspecified: Secondary | ICD-10-CM | POA: Diagnosis not present

## 2019-03-09 DIAGNOSIS — R197 Diarrhea, unspecified: Secondary | ICD-10-CM | POA: Diagnosis not present

## 2019-03-09 DIAGNOSIS — R1312 Dysphagia, oropharyngeal phase: Secondary | ICD-10-CM | POA: Diagnosis not present

## 2019-03-09 DIAGNOSIS — R2681 Unsteadiness on feet: Secondary | ICD-10-CM | POA: Diagnosis not present

## 2019-03-09 DIAGNOSIS — R262 Difficulty in walking, not elsewhere classified: Secondary | ICD-10-CM | POA: Diagnosis not present

## 2019-03-09 DIAGNOSIS — Z741 Need for assistance with personal care: Secondary | ICD-10-CM | POA: Diagnosis not present

## 2019-03-09 DIAGNOSIS — E559 Vitamin D deficiency, unspecified: Secondary | ICD-10-CM | POA: Diagnosis not present

## 2019-03-09 DIAGNOSIS — G629 Polyneuropathy, unspecified: Secondary | ICD-10-CM | POA: Diagnosis not present

## 2019-03-09 DIAGNOSIS — S335XXD Sprain of ligaments of lumbar spine, subsequent encounter: Secondary | ICD-10-CM | POA: Diagnosis not present

## 2019-03-09 DIAGNOSIS — M519 Unspecified thoracic, thoracolumbar and lumbosacral intervertebral disc disorder: Secondary | ICD-10-CM | POA: Diagnosis not present

## 2019-03-09 DIAGNOSIS — M6281 Muscle weakness (generalized): Secondary | ICD-10-CM | POA: Diagnosis not present

## 2019-03-09 DIAGNOSIS — I10 Essential (primary) hypertension: Secondary | ICD-10-CM | POA: Diagnosis not present

## 2019-03-09 DIAGNOSIS — G894 Chronic pain syndrome: Secondary | ICD-10-CM | POA: Diagnosis not present

## 2019-03-09 DIAGNOSIS — E531 Pyridoxine deficiency: Secondary | ICD-10-CM | POA: Diagnosis not present

## 2019-03-10 DIAGNOSIS — R1312 Dysphagia, oropharyngeal phase: Secondary | ICD-10-CM | POA: Diagnosis not present

## 2019-03-10 DIAGNOSIS — G629 Polyneuropathy, unspecified: Secondary | ICD-10-CM | POA: Diagnosis not present

## 2019-03-10 DIAGNOSIS — F330 Major depressive disorder, recurrent, mild: Secondary | ICD-10-CM | POA: Diagnosis not present

## 2019-03-10 DIAGNOSIS — R262 Difficulty in walking, not elsewhere classified: Secondary | ICD-10-CM | POA: Diagnosis not present

## 2019-03-10 DIAGNOSIS — M519 Unspecified thoracic, thoracolumbar and lumbosacral intervertebral disc disorder: Secondary | ICD-10-CM | POA: Diagnosis not present

## 2019-03-10 DIAGNOSIS — M6281 Muscle weakness (generalized): Secondary | ICD-10-CM | POA: Diagnosis not present

## 2019-03-10 DIAGNOSIS — I10 Essential (primary) hypertension: Secondary | ICD-10-CM | POA: Diagnosis not present

## 2019-03-10 DIAGNOSIS — R2681 Unsteadiness on feet: Secondary | ICD-10-CM | POA: Diagnosis not present

## 2019-03-10 DIAGNOSIS — S335XXD Sprain of ligaments of lumbar spine, subsequent encounter: Secondary | ICD-10-CM | POA: Diagnosis not present

## 2019-03-10 DIAGNOSIS — Z741 Need for assistance with personal care: Secondary | ICD-10-CM | POA: Diagnosis not present

## 2019-03-11 DIAGNOSIS — Z741 Need for assistance with personal care: Secondary | ICD-10-CM | POA: Diagnosis not present

## 2019-03-11 DIAGNOSIS — M6281 Muscle weakness (generalized): Secondary | ICD-10-CM | POA: Diagnosis not present

## 2019-03-11 DIAGNOSIS — S335XXD Sprain of ligaments of lumbar spine, subsequent encounter: Secondary | ICD-10-CM | POA: Diagnosis not present

## 2019-03-11 DIAGNOSIS — R1312 Dysphagia, oropharyngeal phase: Secondary | ICD-10-CM | POA: Diagnosis not present

## 2019-03-11 DIAGNOSIS — I10 Essential (primary) hypertension: Secondary | ICD-10-CM | POA: Diagnosis not present

## 2019-03-11 DIAGNOSIS — M519 Unspecified thoracic, thoracolumbar and lumbosacral intervertebral disc disorder: Secondary | ICD-10-CM | POA: Diagnosis not present

## 2019-03-11 DIAGNOSIS — R262 Difficulty in walking, not elsewhere classified: Secondary | ICD-10-CM | POA: Diagnosis not present

## 2019-03-11 DIAGNOSIS — G629 Polyneuropathy, unspecified: Secondary | ICD-10-CM | POA: Diagnosis not present

## 2019-03-11 DIAGNOSIS — R2681 Unsteadiness on feet: Secondary | ICD-10-CM | POA: Diagnosis not present

## 2019-03-12 DIAGNOSIS — R262 Difficulty in walking, not elsewhere classified: Secondary | ICD-10-CM | POA: Diagnosis not present

## 2019-03-12 DIAGNOSIS — M519 Unspecified thoracic, thoracolumbar and lumbosacral intervertebral disc disorder: Secondary | ICD-10-CM | POA: Diagnosis not present

## 2019-03-12 DIAGNOSIS — Z741 Need for assistance with personal care: Secondary | ICD-10-CM | POA: Diagnosis not present

## 2019-03-12 DIAGNOSIS — S335XXD Sprain of ligaments of lumbar spine, subsequent encounter: Secondary | ICD-10-CM | POA: Diagnosis not present

## 2019-03-12 DIAGNOSIS — I10 Essential (primary) hypertension: Secondary | ICD-10-CM | POA: Diagnosis not present

## 2019-03-12 DIAGNOSIS — R2681 Unsteadiness on feet: Secondary | ICD-10-CM | POA: Diagnosis not present

## 2019-03-12 DIAGNOSIS — R1312 Dysphagia, oropharyngeal phase: Secondary | ICD-10-CM | POA: Diagnosis not present

## 2019-03-12 DIAGNOSIS — M6281 Muscle weakness (generalized): Secondary | ICD-10-CM | POA: Diagnosis not present

## 2019-03-12 DIAGNOSIS — G629 Polyneuropathy, unspecified: Secondary | ICD-10-CM | POA: Diagnosis not present

## 2019-03-13 DIAGNOSIS — R2681 Unsteadiness on feet: Secondary | ICD-10-CM | POA: Diagnosis not present

## 2019-03-13 DIAGNOSIS — S335XXD Sprain of ligaments of lumbar spine, subsequent encounter: Secondary | ICD-10-CM | POA: Diagnosis not present

## 2019-03-13 DIAGNOSIS — M6281 Muscle weakness (generalized): Secondary | ICD-10-CM | POA: Diagnosis not present

## 2019-03-13 DIAGNOSIS — R262 Difficulty in walking, not elsewhere classified: Secondary | ICD-10-CM | POA: Diagnosis not present

## 2019-03-13 DIAGNOSIS — G629 Polyneuropathy, unspecified: Secondary | ICD-10-CM | POA: Diagnosis not present

## 2019-03-13 DIAGNOSIS — R1312 Dysphagia, oropharyngeal phase: Secondary | ICD-10-CM | POA: Diagnosis not present

## 2019-03-13 DIAGNOSIS — M519 Unspecified thoracic, thoracolumbar and lumbosacral intervertebral disc disorder: Secondary | ICD-10-CM | POA: Diagnosis not present

## 2019-03-13 DIAGNOSIS — I10 Essential (primary) hypertension: Secondary | ICD-10-CM | POA: Diagnosis not present

## 2019-03-13 DIAGNOSIS — Z741 Need for assistance with personal care: Secondary | ICD-10-CM | POA: Diagnosis not present

## 2019-03-17 DIAGNOSIS — F330 Major depressive disorder, recurrent, mild: Secondary | ICD-10-CM | POA: Diagnosis not present

## 2019-03-24 DIAGNOSIS — F330 Major depressive disorder, recurrent, mild: Secondary | ICD-10-CM | POA: Diagnosis not present

## 2019-03-31 DIAGNOSIS — F330 Major depressive disorder, recurrent, mild: Secondary | ICD-10-CM | POA: Diagnosis not present

## 2019-04-05 DIAGNOSIS — U071 COVID-19: Secondary | ICD-10-CM | POA: Diagnosis not present

## 2019-04-07 DIAGNOSIS — F330 Major depressive disorder, recurrent, mild: Secondary | ICD-10-CM | POA: Diagnosis not present

## 2019-04-13 DIAGNOSIS — R112 Nausea with vomiting, unspecified: Secondary | ICD-10-CM | POA: Diagnosis not present

## 2019-04-13 DIAGNOSIS — R197 Diarrhea, unspecified: Secondary | ICD-10-CM | POA: Diagnosis not present

## 2019-04-13 DIAGNOSIS — U071 COVID-19: Secondary | ICD-10-CM | POA: Diagnosis not present

## 2019-04-13 DIAGNOSIS — R519 Headache, unspecified: Secondary | ICD-10-CM | POA: Diagnosis not present

## 2019-04-24 ENCOUNTER — Encounter (HOSPITAL_COMMUNITY): Payer: Self-pay

## 2019-04-24 ENCOUNTER — Other Ambulatory Visit: Payer: Self-pay

## 2019-04-24 ENCOUNTER — Inpatient Hospital Stay (HOSPITAL_COMMUNITY)
Admission: EM | Admit: 2019-04-24 | Discharge: 2019-06-05 | DRG: 177 | Disposition: E | Payer: Medicare HMO | Attending: Internal Medicine | Admitting: Internal Medicine

## 2019-04-24 ENCOUNTER — Emergency Department (HOSPITAL_COMMUNITY): Payer: Medicare HMO

## 2019-04-24 DIAGNOSIS — Z6837 Body mass index (BMI) 37.0-37.9, adult: Secondary | ICD-10-CM

## 2019-04-24 DIAGNOSIS — E871 Hypo-osmolality and hyponatremia: Secondary | ICD-10-CM | POA: Diagnosis present

## 2019-04-24 DIAGNOSIS — N183 Chronic kidney disease, stage 3 unspecified: Secondary | ICD-10-CM | POA: Diagnosis present

## 2019-04-24 DIAGNOSIS — E782 Mixed hyperlipidemia: Secondary | ICD-10-CM | POA: Diagnosis not present

## 2019-04-24 DIAGNOSIS — Z8249 Family history of ischemic heart disease and other diseases of the circulatory system: Secondary | ICD-10-CM

## 2019-04-24 DIAGNOSIS — M5126 Other intervertebral disc displacement, lumbar region: Secondary | ICD-10-CM | POA: Diagnosis present

## 2019-04-24 DIAGNOSIS — G43909 Migraine, unspecified, not intractable, without status migrainosus: Secondary | ICD-10-CM | POA: Diagnosis present

## 2019-04-24 DIAGNOSIS — R0902 Hypoxemia: Secondary | ICD-10-CM

## 2019-04-24 DIAGNOSIS — K625 Hemorrhage of anus and rectum: Secondary | ICD-10-CM | POA: Clinically undetermined

## 2019-04-24 DIAGNOSIS — F418 Other specified anxiety disorders: Secondary | ICD-10-CM | POA: Diagnosis present

## 2019-04-24 DIAGNOSIS — F411 Generalized anxiety disorder: Secondary | ICD-10-CM | POA: Diagnosis present

## 2019-04-24 DIAGNOSIS — Z79899 Other long term (current) drug therapy: Secondary | ICD-10-CM

## 2019-04-24 DIAGNOSIS — I5033 Acute on chronic diastolic (congestive) heart failure: Secondary | ICD-10-CM | POA: Diagnosis present

## 2019-04-24 DIAGNOSIS — M1990 Unspecified osteoarthritis, unspecified site: Secondary | ICD-10-CM | POA: Diagnosis present

## 2019-04-24 DIAGNOSIS — K219 Gastro-esophageal reflux disease without esophagitis: Secondary | ICD-10-CM | POA: Diagnosis present

## 2019-04-24 DIAGNOSIS — Z515 Encounter for palliative care: Secondary | ICD-10-CM | POA: Diagnosis present

## 2019-04-24 DIAGNOSIS — G2581 Restless legs syndrome: Secondary | ICD-10-CM | POA: Diagnosis present

## 2019-04-24 DIAGNOSIS — F32A Depression, unspecified: Secondary | ICD-10-CM | POA: Diagnosis present

## 2019-04-24 DIAGNOSIS — Z820 Family history of epilepsy and other diseases of the nervous system: Secondary | ICD-10-CM

## 2019-04-24 DIAGNOSIS — F419 Anxiety disorder, unspecified: Secondary | ICD-10-CM | POA: Diagnosis present

## 2019-04-24 DIAGNOSIS — Z825 Family history of asthma and other chronic lower respiratory diseases: Secondary | ICD-10-CM

## 2019-04-24 DIAGNOSIS — Z09 Encounter for follow-up examination after completed treatment for conditions other than malignant neoplasm: Secondary | ICD-10-CM

## 2019-04-24 DIAGNOSIS — Z23 Encounter for immunization: Secondary | ICD-10-CM

## 2019-04-24 DIAGNOSIS — Z8673 Personal history of transient ischemic attack (TIA), and cerebral infarction without residual deficits: Secondary | ICD-10-CM

## 2019-04-24 DIAGNOSIS — U071 COVID-19: Principal | ICD-10-CM | POA: Diagnosis present

## 2019-04-24 DIAGNOSIS — Z792 Long term (current) use of antibiotics: Secondary | ICD-10-CM

## 2019-04-24 DIAGNOSIS — N179 Acute kidney failure, unspecified: Secondary | ICD-10-CM | POA: Diagnosis present

## 2019-04-24 DIAGNOSIS — I509 Heart failure, unspecified: Secondary | ICD-10-CM | POA: Diagnosis not present

## 2019-04-24 DIAGNOSIS — I10 Essential (primary) hypertension: Secondary | ICD-10-CM | POA: Diagnosis present

## 2019-04-24 DIAGNOSIS — G4700 Insomnia, unspecified: Secondary | ICD-10-CM | POA: Diagnosis present

## 2019-04-24 DIAGNOSIS — Z8261 Family history of arthritis: Secondary | ICD-10-CM

## 2019-04-24 DIAGNOSIS — Z96653 Presence of artificial knee joint, bilateral: Secondary | ICD-10-CM | POA: Diagnosis present

## 2019-04-24 DIAGNOSIS — H269 Unspecified cataract: Secondary | ICD-10-CM | POA: Diagnosis present

## 2019-04-24 DIAGNOSIS — R0602 Shortness of breath: Secondary | ICD-10-CM | POA: Diagnosis not present

## 2019-04-24 DIAGNOSIS — J9601 Acute respiratory failure with hypoxia: Secondary | ICD-10-CM | POA: Diagnosis not present

## 2019-04-24 DIAGNOSIS — Z9049 Acquired absence of other specified parts of digestive tract: Secondary | ICD-10-CM

## 2019-04-24 DIAGNOSIS — J189 Pneumonia, unspecified organism: Secondary | ICD-10-CM | POA: Diagnosis not present

## 2019-04-24 DIAGNOSIS — E875 Hyperkalemia: Secondary | ICD-10-CM | POA: Diagnosis present

## 2019-04-24 DIAGNOSIS — M419 Scoliosis, unspecified: Secondary | ICD-10-CM | POA: Diagnosis present

## 2019-04-24 DIAGNOSIS — J1282 Pneumonia due to coronavirus disease 2019: Secondary | ICD-10-CM | POA: Diagnosis present

## 2019-04-24 DIAGNOSIS — M5416 Radiculopathy, lumbar region: Secondary | ICD-10-CM | POA: Diagnosis present

## 2019-04-24 DIAGNOSIS — G9341 Metabolic encephalopathy: Secondary | ICD-10-CM | POA: Diagnosis not present

## 2019-04-24 DIAGNOSIS — D539 Nutritional anemia, unspecified: Secondary | ICD-10-CM | POA: Diagnosis present

## 2019-04-24 DIAGNOSIS — K5641 Fecal impaction: Secondary | ICD-10-CM

## 2019-04-24 DIAGNOSIS — Z833 Family history of diabetes mellitus: Secondary | ICD-10-CM

## 2019-04-24 DIAGNOSIS — I130 Hypertensive heart and chronic kidney disease with heart failure and stage 1 through stage 4 chronic kidney disease, or unspecified chronic kidney disease: Secondary | ICD-10-CM | POA: Diagnosis present

## 2019-04-24 DIAGNOSIS — Z5320 Procedure and treatment not carried out because of patient's decision for unspecified reasons: Secondary | ICD-10-CM | POA: Diagnosis not present

## 2019-04-24 DIAGNOSIS — E876 Hypokalemia: Secondary | ICD-10-CM | POA: Diagnosis not present

## 2019-04-24 DIAGNOSIS — Z7982 Long term (current) use of aspirin: Secondary | ICD-10-CM

## 2019-04-24 DIAGNOSIS — R918 Other nonspecific abnormal finding of lung field: Secondary | ICD-10-CM | POA: Diagnosis not present

## 2019-04-24 DIAGNOSIS — G8929 Other chronic pain: Secondary | ICD-10-CM | POA: Diagnosis present

## 2019-04-24 DIAGNOSIS — R0600 Dyspnea, unspecified: Secondary | ICD-10-CM | POA: Diagnosis not present

## 2019-04-24 DIAGNOSIS — Z822 Family history of deafness and hearing loss: Secondary | ICD-10-CM

## 2019-04-24 DIAGNOSIS — E861 Hypovolemia: Secondary | ICD-10-CM | POA: Diagnosis present

## 2019-04-24 DIAGNOSIS — J40 Bronchitis, not specified as acute or chronic: Secondary | ICD-10-CM | POA: Diagnosis not present

## 2019-04-24 DIAGNOSIS — F329 Major depressive disorder, single episode, unspecified: Secondary | ICD-10-CM | POA: Diagnosis not present

## 2019-04-24 DIAGNOSIS — Z79891 Long term (current) use of opiate analgesic: Secondary | ICD-10-CM

## 2019-04-24 DIAGNOSIS — E1165 Type 2 diabetes mellitus with hyperglycemia: Secondary | ICD-10-CM | POA: Diagnosis not present

## 2019-04-24 DIAGNOSIS — E669 Obesity, unspecified: Secondary | ICD-10-CM | POA: Diagnosis present

## 2019-04-24 DIAGNOSIS — Z8042 Family history of malignant neoplasm of prostate: Secondary | ICD-10-CM

## 2019-04-24 DIAGNOSIS — H409 Unspecified glaucoma: Secondary | ICD-10-CM | POA: Diagnosis present

## 2019-04-24 DIAGNOSIS — Z91041 Radiographic dye allergy status: Secondary | ICD-10-CM

## 2019-04-24 DIAGNOSIS — J1289 Other viral pneumonia: Secondary | ICD-10-CM | POA: Diagnosis present

## 2019-04-24 DIAGNOSIS — Z90710 Acquired absence of both cervix and uterus: Secondary | ICD-10-CM

## 2019-04-24 DIAGNOSIS — E860 Dehydration: Secondary | ICD-10-CM | POA: Diagnosis present

## 2019-04-24 DIAGNOSIS — M7989 Other specified soft tissue disorders: Secondary | ICD-10-CM | POA: Diagnosis not present

## 2019-04-24 DIAGNOSIS — E785 Hyperlipidemia, unspecified: Secondary | ICD-10-CM | POA: Diagnosis present

## 2019-04-24 DIAGNOSIS — E873 Alkalosis: Secondary | ICD-10-CM | POA: Diagnosis not present

## 2019-04-24 DIAGNOSIS — Z66 Do not resuscitate: Secondary | ICD-10-CM | POA: Diagnosis present

## 2019-04-24 DIAGNOSIS — I959 Hypotension, unspecified: Secondary | ICD-10-CM | POA: Diagnosis not present

## 2019-04-24 DIAGNOSIS — E1142 Type 2 diabetes mellitus with diabetic polyneuropathy: Secondary | ICD-10-CM | POA: Diagnosis present

## 2019-04-24 DIAGNOSIS — Z8711 Personal history of peptic ulcer disease: Secondary | ICD-10-CM

## 2019-04-24 NOTE — ED Triage Notes (Signed)
Per EMS, patient from [facility]. Pt tested positive for covid on November 6th. Currently patient only c/o lack of appetite, but staff at nursing home wanted her to come to hospital because of her low o2 sats. Patient is not on oxygen usually, but was on 5L [place] at nursing home. Patient is mobile with 2 person assist. Patient is a&ox4.

## 2019-04-24 NOTE — ED Provider Notes (Signed)
Emergency Department Provider Note   I have reviewed the triage vital signs and the nursing notes.   HISTORY  Chief Complaint No chief complaint on file.   HPI [name] is a 78 y.o. female presents to the ED from [facility] with known COVID 19 infection diagnosed initially on Nov 6th.  Patient states that she has had cough and runny nose but fairly mild symptoms.  She states that she was isolated in a Covid unit at [facility] and was released today, however, once returning staff noticed that she was not hypoxic into the low 80s and ultimately requiring up to 5 L nasal cannula to correct this.  Patient denies any sudden worsening shortness of breath, nausea, vomiting. Denies fever. She states that overall she was feeling fairly well.    Past Medical History:  Diagnosis Date  . Anxiety   . Arthritis    djd  . Blood transfusion without reported diagnosis   . Cataracts, bilateral    immature   . Cellulitis    10/11/11 hospitalized for cellulitis   . Chicken pox   . Chronic back pain    scoliosis/stenosis  . Depression    takes Zoloft daily  . Elbow fracture, left April 2015  . GERD (gastroesophageal reflux disease)    hx pud '98  . Glaucoma   . Hyperlipemia    takes Atorvastatin daily  . Hypertension    takes HCTZ daily  . Insomnia   . Migraine   . Peripheral neuropathy   . 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 Radiculopathy, lumbar region   . Restless leg syndrome    takes Sinemet daily  . TIA (transient ischemic attack)    Possible TIA per 2013 DC summary  . Vertigo     Patient Active Problem List   Diagnosis Date Noted  . Multifocal pneumonia 04/25/2019  . Edema 11/06/2017  . Uncomplicated opioid dependence ([place]) 07/20/2016  . Rotator cuff tear arthropathy of right shoulder 07/20/2016  . Nontraumatic incomplete tear of right rotator cuff 07/17/2016  . Acute pain of right shoulder 06/27/2016  . Impingement syndrome of right  shoulder 06/11/2016  . Colon cancer screening 05/17/2016  . Heme positive stool 05/17/2016  . Esophageal ulcer with bleeding 05/17/2016  . Duodenal ulcer 05/17/2016  . Herniated nucleus pulposus, L2-3 right 12/19/2015  . Right foot pain 07/22/2015  . Chest pain, atypical 07/11/2015  . History of drug overdose 07/07/2015  . Frequent falls 07/07/2015  . Hyperlipidemia 07/07/2015  . Essential hypertension 07/07/2015  . Spinal stenosis, lumbar region, with neurogenic claudication 07/07/2015  . Severe episode of recurrent major depressive disorder, with psychotic features ([place]) 07/07/2015  . Visual hallucination 07/01/2015  . Severe episode of recurrent major depressive disorder, without psychotic features ([place])   . Back pain 06/20/2015  . MDD (major depressive disorder), recurrent episode, severe ([place]) 06/16/2015  . Lumbar stenosis 04/19/2015  . Hypokalemia 09/19/2014  . Constipation 09/19/2014  . Acute respiratory failure with hypoxia ([place]) 09/17/2014  . OSA (obstructive sleep apnea) 09/17/2014  . Ankle fracture, lateral malleolus, closed 09/14/2014  . Major depressive disorder, recurrent severe without psychotic features ([place]) 08/07/2014  . Insomnia 08/05/2014  . Hip joint painful on movement 08/05/2014  . Degenerative disc disease, lumbar 08/05/2014  . Severe obesity (BMI >= 40) ([place]) 01/11/2014  . Depression   . Arthritis   . Polyneuropathy in other diseases classified elsewhere ([place]) 04/08/2013  .  Restless legs syndrome (RLS) 04/08/2013  . UTI (lower urinary tract infection) 10/14/2011  . Dizziness 10/12/2011  . TIA (transient ischemic attack) 10/12/2011  . GERD (gastroesophageal reflux disease) 10/12/2011    Past Surgical History:  Procedure Laterality Date  . ABDOMINAL HYSTERECTOMY  1975   bil oophorectomy  . APPENDECTOMY    . BACK SURGERY   MANY YRS AGO   laminectomy x3  . CARPAL TUNNEL RELEASE  07/09/2012   Procedure: CARPAL TUNNEL RELEASE;  Surgeon: Magnus Sinning, MD;  Location: [facility];  Service: Orthopedics;  Laterality: Left;  . cervical disc surgery x2  2011  . CHOLECYSTECTOMY  1993  . colonosocpy    . ESOPHAGOGASTRODUODENOSCOPY    . FINGER ARTHROPLASTY  07/09/2012   Procedure: FINGER ARTHROPLASTY;  Surgeon: Magnus Sinning, MD;  Location: [facility];  Service: Orthopedics;  Laterality: Left;  Interposition Arthroplasty CMC Joint Thumb Left   . HEMATOMA EVACUATION  2011   s/p rt cea  . JOINT REPLACEMENT  '01   total knee replacement, LEFT  . left knee arthroscopy  2001  . LIPOMA EXCISION  07/09/2012   Procedure: EXCISION LIPOMA;  Surgeon: Magnus Sinning, MD;  Location: [facility];  Service: Orthopedics;  Laterality: Left;  Excision Lipoma Dorsum Left Wrist   . LUMBAR LAMINECTOMY WITH COFLEX 1 LEVEL Bilateral 04/19/2015   Procedure: LUMBAR TWO-THREE LUMBAR LAMINECTOMY WITH COFLEX ;  Surgeon: Kristeen Miss, MD;  Location: [facility];  Service: Neurosurgery;  Laterality: Bilateral;  Bilateral L23 laminectomy and foraminotomy with coflex  . LUMBAR LAMINECTOMY/DECOMPRESSION MICRODISCECTOMY Right 12/19/2015   Procedure: Right Lumbar two-three  Microdiskectomy;  Surgeon: Kristeen Miss, MD;  Location: [facility];  Service: Neurosurgery;  Laterality: Right;  . ORIF ANKLE FRACTURE Right 09/14/2014   FIBULA   . ORIF ANKLE FRACTURE Right 09/14/2014   Procedure: OPEN REDUCTION INTERNAL FIXATION (ORIF) RIGHT ANKLE FRACTURE/SYNDESMOSIS ;  Surgeon: Wylene Simmer, MD;  Location: [place];  Service: Orthopedics;  Laterality: Right;  . right knee replacement     09/2011   . rt carotid enarterectomy  2011  . rt knee arthroscopy  '99  . SHOULDER ARTHROSCOPY Right   . TOTAL KNEE ARTHROPLASTY  09/13/2011   Procedure: TOTAL KNEE ARTHROPLASTY;  Surgeon: Magnus Sinning, MD;  Location: [facility];  Service: Orthopedics;  Laterality: Right;    Allergies Contrast media [iodinated diagnostic agents] and Sulfa drugs cross reactors  Family History  Problem Relation Age of  Onset  . Congestive Heart Failure Father   . Arthritis Father   . Hearing loss Father   . Heart disease Father   . Congestive Heart Failure Sister   . Arthritis Sister   . COPD Sister   . Depression Sister   . Early death Sister   . Hypertension Sister   . Alzheimer's disease Mother   . Arthritis Mother   . Diabetes Brother   . Cancer Brother        adrenal gland  . Testicular cancer Brother   . Arthritis Brother   . Heart disease Brother   . Hyperlipidemia Brother   . Hypertension Brother   . Arthritis Brother   . Cancer Brother        prostate  . Diabetes Brother   . Prostate cancer Brother   . Hypertension Brother   . Early death Maternal Grandmother   . Early death Maternal Grandfather   . Early death Paternal Grandmother   . Early death Paternal Grandfather  Social History Social History   Tobacco Use  . Smoking status: Never Smoker  . Smokeless tobacco: Never Used  Substance Use Topics  . Alcohol use: No    Alcohol/week: 0.0 standard drinks  . Drug use: No    Review of Systems  Constitutional: No fever/chills Eyes: No visual changes. ENT: No sore throat. Cardiovascular: Denies chest pain. Respiratory: Mild shortness of breath and cough.  Gastrointestinal: No abdominal pain.  No nausea, no vomiting.  No diarrhea.  No constipation. Genitourinary: Negative for dysuria. Musculoskeletal: Negative for back pain. Skin: Negative for rash. Neurological: Negative for headaches, focal weakness or numbness.  10-point ROS otherwise negative.  ____________________________________________   PHYSICAL EXAM:  VITAL SIGNS: ED Triage Vitals  Enc Vitals Group     BP 04/06/2019 2157 (!) 116/59     Pulse Rate 04/16/2019 2157 67     Resp 04/14/2019 2157 (!) 22     Temp 04/15/2019 2157 98.2 F (36.8 C)     Temp Source 04/13/2019 2157 Oral     SpO2 04/17/2019 2147 97 %     Weight 04/23/2019 2200 225 lb (102.1 kg)     Height 04/12/2019 2200 5\' 5"  (1.651 m)    Constitutional: Alert and oriented. Well appearing and in no acute distress. Eyes: Conjunctivae are normal.  Head: Atraumatic. Nose: No congestion/rhinnorhea. Mouth/Throat: Mucous membranes are moist.   Neck: No stridor.   Cardiovascular: Normal rate, regular rhythm. Good peripheral circulation. Grossly normal heart sounds.   Respiratory: Slight increased respiratory effort.  No retractions. Lungs CTAB. Gastrointestinal: Soft and nontender. No distention.  Musculoskeletal: No gross deformities of extremities. Neurologic:  Normal speech and language.  Skin:  Skin is warm, dry and intact. No rash noted.   ____________________________________________   LABS (all labs ordered are listed, but only abnormal results are displayed)  Labs Reviewed  CBC WITH DIFFERENTIAL/PLATELET - Abnormal; Notable for the following components:      Result Value   RBC 3.83 (*)    Hemoglobin 11.9 (*)    MCV 100.3 (*)    Abs Immature Granulocytes 0.09 (*)    All other components within normal limits  COMPREHENSIVE METABOLIC PANEL - Abnormal; Notable for the following components:   Sodium 134 (*)    Glucose, Bld 123 (*)    Creatinine, Ser 1.01 (*)    Calcium 8.2 (*)    Total Protein 6.2 (*)    Albumin 2.1 (*)    GFR calc non Af Amer 54 (*)    All other components within normal limits  D-DIMER, QUANTITATIVE (NOT AT [facility]) - Abnormal; Notable for the following components:   D-Dimer, Quant 1.02 (*)    All other components within normal limits  LACTATE DEHYDROGENASE - Abnormal; Notable for the following components:   LDH 295 (*)    All other components within normal limits  FIBRINOGEN - Abnormal; Notable for the following components:   Fibrinogen 701 (*)    All other components within normal limits  C-REACTIVE PROTEIN - Abnormal; Notable for the following components:   CRP 17.4 (*)    All other components within normal limits  LACTATE DEHYDROGENASE - Abnormal; Notable for the following components:    LDH 309 (*)    All other components within normal limits  CULTURE, BLOOD (ROUTINE X 2)  CULTURE, BLOOD (ROUTINE X 2)  SARS CORONAVIRUS 2 (TAT 6-24 HRS)  RESPIRATORY PANEL BY PCR  LACTIC ACID, PLASMA  PROCALCITONIN  FERRITIN  TRIGLYCERIDES  INFLUENZA PANEL BY PCR (  TYPE A & B)  POC SARS CORONAVIRUS 2 AG -  ED  ABO/RH   ____________________________________________  EKG   EKG Interpretation  Date/Time:  Friday April 24 2019 22:59:21 EST Ventricular Rate:  64 PR Interval:    QRS Duration: 92 QT Interval:  422 QTC Calculation: 436 R Axis:   26 Text Interpretation: Sinus rhythm Low voltage, precordial leads Nonspecific T abnormalities, anterior leads No STEMI Confirmed by Nanda Quinton [phone]) on 04/25/2019 10:20:58 AM       ____________________________________________  RADIOLOGY  Dg Chest Port 1 View  Result Date: 04/12/2019 CLINICAL DATA:  COVID-19 with hypoxemia EXAM: PORTABLE CHEST 1 VIEW COMPARISON:  Radiograph 06/17/2018, CT chest 09/17/2014, CT cervical spine 06/17/2018 FINDINGS: Multifocal areas of airspace opacity most pronounced in the right lung and in the left lung base. No pneumothorax. No visible effusion. Cardiomediastinal contours are similar to prior including abundant mediastinal fat seen on prior CT. Degenerative changes are present in the imaged spine and shoulders. Cervical fusion hardware is noted in similar appearance to comparison CT of the cervical spine though incompletely assessed on this exam. IMPRESSION: Multifocal areas of airspace opacity most pronounced in the right lung and in the left lung base concerning for multifocal pneumonia in the setting of COVID-19 positivity. Electronically Signed   By: Lovena Le M.D.   On: 05/03/2019 22:55    ____________________________________________   PROCEDURES  Procedure(s) performed:   Procedures  CRITICAL CARE Performed by: Margette Fast Total critical care time: 35 minutes Critical care time was  exclusive of separately billable procedures and treating other patients. Critical care was necessary to treat or prevent imminent or life-threatening deterioration. Critical care was time spent personally by me on the following activities: development of treatment plan with patient and/or surrogate as well as nursing, discussions with consultants, evaluation of patient's response to treatment, examination of patient, obtaining history from patient or surrogate, ordering and performing treatments and interventions, ordering and review of laboratory studies, ordering and review of radiographic studies, pulse oximetry and re-evaluation of patient's condition.  Nanda Quinton, MD Emergency Medicine  ____________________________________________   INITIAL IMPRESSION / ASSESSMENT AND PLAN / ED COURSE  Pertinent labs & imaging results that were available during my care of the patient were reviewed by me and considered in my medical decision making (see chart for details).   Patient presents to the emergency department for evaluation of hypoxemia with COVID-19 infection.  Patient's chest x-ray shows multifocal pneumonia.  Initially diagnosed on November 6th. Patient on 4L [place] here and in no acute distress. Sending pre-admit labs and will reassess.   Rapid COVID negative. Will cover with HCAP abx with possibility of bacterial process. No acute distress.   Discussed patient's case with [facility] to request admission. Patient and family (if present) updated with plan. Care transferred to [facility].  I reviewed all nursing notes, vitals, pertinent old records, EKGs, labs, imaging (as available).  ____________________________________________  FINAL CLINICAL IMPRESSION(S) / ED DIAGNOSES  Final diagnoses:  COVID-19  Hypoxemia     MEDICATIONS GIVEN DURING THIS VISIT:  Medications  aspirin EC tablet 325 mg (325 mg Oral Given 04/25/19 0902)  atorvastatin (LIPITOR) tablet 40 mg (has no administration in time  range)  metoprolol succinate (TOPROL-XL) 24 hr tablet 50 mg (50 mg Oral Given 04/25/19 0859)  metoprolol succinate (TOPROL-XL) 24 hr tablet 100 mg (100 mg Oral Given 04/25/19 0901)  sertraline (ZOLOFT) tablet 50 mg (50 mg Oral Given 04/25/19 0859)  cyclobenzaprine (FLEXERIL) tablet 10  mg (has no administration in time range)  gabapentin (NEURONTIN) capsule 600 mg (600 mg Oral Given 04/25/19 0740)  cholecalciferol (VITAMIN D3) tablet 1,000 Units (1,000 Units Oral Given 04/25/19 0902)  pyridOXINE (VITAMIN B-6) tablet 50 mg (50 mg Oral Given 04/25/19 0858)  albuterol (VENTOLIN HFA) 108 (90 Base) MCG/ACT inhaler 2 puff (has no administration in time range)  dexamethasone (DECADRON) injection 6 mg (6 mg Intravenous Given 04/25/19 0738)  guaiFENesin-dextromethorphan (ROBITUSSIN DM) 100-10 MG/5ML syrup 10 mL (has no administration in time range)  chlorpheniramine-HYDROcodone (TUSSIONEX) 10-8 MG/5ML suspension 5 mL (has no administration in time range)  vitamin C (ASCORBIC ACID) tablet 500 mg (500 mg Oral Given 04/25/19 0859)  zinc sulfate capsule 220 mg (220 mg Oral Given 04/25/19 0900)  acetaminophen (TYLENOL) tablet 650 mg (has no administration in time range)  ceFEPIme (MAXIPIME) 2 g in sodium chloride 0.9 % 100 mL IVPB (has no administration in time range)  vancomycin (VANCOCIN) IVPB 1000 mg/200 mL premix (has no administration in time range)  ondansetron (ZOFRAN) injection 4 mg (4 mg Intravenous Given 04/25/19 1015)  Ipratropium-Albuterol (COMBIVENT) respimat 1 puff (has no administration in time range)  remdesivir 200 mg in sodium chloride 0.9 % 250 mL IVPB (has no administration in time range)    Followed by  remdesivir 100 mg in sodium chloride 0.9 % 250 mL IVPB (has no administration in time range)  enoxaparin (LOVENOX) injection 50 mg (has no administration in time range)  vancomycin (VANCOCIN) 2,250 mg in sodium chloride 0.9 % 500 mL IVPB ( Intravenous Stopped 04/25/19 0956)  ceFEPIme  (MAXIPIME) 2 g in sodium chloride 0.9 % 100 mL IVPB (0 g Intravenous Stopped 04/25/19 0704)    Note:  This document was prepared using Dragon voice recognition software and may include unintentional dictation errors.  Nanda Quinton, MD, [facility] Emergency Medicine    Long, [place], MD 04/25/19 [phone]

## 2019-04-24 NOTE — ED Notes (Signed)
Attempted to obtain blood cultures from left hand w/out success.

## 2019-04-24 NOTE — ED Notes (Signed)
Phlebotomy contacted to get blood work for patient.

## 2019-04-25 DIAGNOSIS — E873 Alkalosis: Secondary | ICD-10-CM | POA: Diagnosis not present

## 2019-04-25 DIAGNOSIS — J1289 Other viral pneumonia: Secondary | ICD-10-CM | POA: Diagnosis present

## 2019-04-25 DIAGNOSIS — F329 Major depressive disorder, single episode, unspecified: Secondary | ICD-10-CM | POA: Diagnosis present

## 2019-04-25 DIAGNOSIS — K625 Hemorrhage of anus and rectum: Secondary | ICD-10-CM | POA: Diagnosis present

## 2019-04-25 DIAGNOSIS — F418 Other specified anxiety disorders: Secondary | ICD-10-CM | POA: Diagnosis present

## 2019-04-25 DIAGNOSIS — Z66 Do not resuscitate: Secondary | ICD-10-CM | POA: Diagnosis present

## 2019-04-25 DIAGNOSIS — D539 Nutritional anemia, unspecified: Secondary | ICD-10-CM | POA: Diagnosis present

## 2019-04-25 DIAGNOSIS — I10 Essential (primary) hypertension: Secondary | ICD-10-CM

## 2019-04-25 DIAGNOSIS — F411 Generalized anxiety disorder: Secondary | ICD-10-CM | POA: Diagnosis present

## 2019-04-25 DIAGNOSIS — U071 COVID-19: Secondary | ICD-10-CM | POA: Diagnosis present

## 2019-04-25 DIAGNOSIS — M7989 Other specified soft tissue disorders: Secondary | ICD-10-CM | POA: Diagnosis not present

## 2019-04-25 DIAGNOSIS — Z23 Encounter for immunization: Secondary | ICD-10-CM | POA: Diagnosis not present

## 2019-04-25 DIAGNOSIS — I5033 Acute on chronic diastolic (congestive) heart failure: Secondary | ICD-10-CM | POA: Diagnosis present

## 2019-04-25 DIAGNOSIS — Z515 Encounter for palliative care: Secondary | ICD-10-CM | POA: Diagnosis present

## 2019-04-25 DIAGNOSIS — J1282 Pneumonia due to coronavirus disease 2019: Secondary | ICD-10-CM | POA: Diagnosis present

## 2019-04-25 DIAGNOSIS — N183 Chronic kidney disease, stage 3 unspecified: Secondary | ICD-10-CM | POA: Diagnosis present

## 2019-04-25 DIAGNOSIS — E669 Obesity, unspecified: Secondary | ICD-10-CM | POA: Diagnosis present

## 2019-04-25 DIAGNOSIS — E871 Hypo-osmolality and hyponatremia: Secondary | ICD-10-CM | POA: Diagnosis present

## 2019-04-25 DIAGNOSIS — I130 Hypertensive heart and chronic kidney disease with heart failure and stage 1 through stage 4 chronic kidney disease, or unspecified chronic kidney disease: Secondary | ICD-10-CM | POA: Diagnosis present

## 2019-04-25 DIAGNOSIS — J9601 Acute respiratory failure with hypoxia: Secondary | ICD-10-CM | POA: Diagnosis present

## 2019-04-25 DIAGNOSIS — K219 Gastro-esophageal reflux disease without esophagitis: Secondary | ICD-10-CM | POA: Diagnosis present

## 2019-04-25 DIAGNOSIS — J189 Pneumonia, unspecified organism: Secondary | ICD-10-CM | POA: Diagnosis present

## 2019-04-25 DIAGNOSIS — E860 Dehydration: Secondary | ICD-10-CM | POA: Diagnosis present

## 2019-04-25 DIAGNOSIS — R0902 Hypoxemia: Secondary | ICD-10-CM | POA: Diagnosis present

## 2019-04-25 DIAGNOSIS — E785 Hyperlipidemia, unspecified: Secondary | ICD-10-CM | POA: Diagnosis present

## 2019-04-25 DIAGNOSIS — E875 Hyperkalemia: Secondary | ICD-10-CM | POA: Diagnosis present

## 2019-04-25 DIAGNOSIS — G9341 Metabolic encephalopathy: Secondary | ICD-10-CM | POA: Diagnosis not present

## 2019-04-25 DIAGNOSIS — M1990 Unspecified osteoarthritis, unspecified site: Secondary | ICD-10-CM | POA: Diagnosis present

## 2019-04-25 DIAGNOSIS — N179 Acute kidney failure, unspecified: Secondary | ICD-10-CM | POA: Diagnosis present

## 2019-04-25 DIAGNOSIS — E782 Mixed hyperlipidemia: Secondary | ICD-10-CM

## 2019-04-25 LAB — COMPREHENSIVE METABOLIC PANEL
ALT: 8 U/L (ref 0–44)
AST: 24 U/L (ref 15–41)
Albumin: 2.1 g/dL — ABNORMAL LOW (ref 3.5–5.0)
Alkaline Phosphatase: 96 U/L (ref 38–126)
Anion gap: 8 (ref 5–15)
BUN: 22 mg/dL (ref 8–23)
CO2: 22 mmol/L (ref 22–32)
Calcium: 8.2 mg/dL — ABNORMAL LOW (ref 8.9–10.3)
Chloride: 104 mmol/L (ref 98–111)
Creatinine, Ser: 1.01 mg/dL — ABNORMAL HIGH (ref 0.44–1.00)
GFR calc Af Amer: >60 mL/min (ref >60)
GFR calc non Af Amer: 54 mL/min — ABNORMAL LOW (ref >60)
Glucose, Bld: 123 mg/dL — ABNORMAL HIGH (ref 70–99)
Potassium: 4.7 mmol/L (ref 3.5–5.1)
Sodium: 134 mmol/L — ABNORMAL LOW (ref 135–145)
Total Bilirubin: 0.3 mg/dL (ref 0.3–1.2)
Total Protein: 6.2 g/dL — ABNORMAL LOW (ref 6.5–8.1)

## 2019-04-25 LAB — CBC WITH DIFFERENTIAL/PLATELET
Abs Immature Granulocytes: 0.09 10*3/uL — ABNORMAL HIGH (ref 0.00–0.07)
Basophils Absolute: 0 10*3/uL (ref 0.0–0.1)
Basophils Relative: 0 %
Eosinophils Absolute: 0.2 10*3/uL (ref 0.0–0.5)
Eosinophils Relative: 3 %
HCT: 38.4 % (ref 36.0–46.0)
Hemoglobin: 11.9 g/dL — ABNORMAL LOW (ref 12.0–15.0)
Immature Granulocytes: 1 %
Lymphocytes Relative: 22 %
Lymphs Abs: 1.9 10*3/uL (ref 0.7–4.0)
MCH: 31.1 pg (ref 26.0–34.0)
MCHC: 31 g/dL (ref 30.0–36.0)
MCV: 100.3 fL — ABNORMAL HIGH (ref 80.0–100.0)
Monocytes Absolute: 0.7 10*3/uL (ref 0.1–1.0)
Monocytes Relative: 8 %
Neutro Abs: 5.5 10*3/uL (ref 1.7–7.7)
Neutrophils Relative %: 66 %
Platelets: 318 10*3/uL (ref 150–400)
RBC: 3.83 MIL/uL — ABNORMAL LOW (ref 3.87–5.11)
RDW: 15.3 % (ref 11.5–15.5)
WBC: 8.4 10*3/uL (ref 4.0–10.5)
nRBC: 0 % (ref 0.0–0.2)

## 2019-04-25 LAB — LACTIC ACID, PLASMA: Lactic Acid, Venous: 1.5 mmol/L (ref 0.5–1.9)

## 2019-04-25 LAB — FIBRINOGEN: Fibrinogen: 701 mg/dL — ABNORMAL HIGH (ref 210–475)

## 2019-04-25 LAB — LACTATE DEHYDROGENASE
LDH: 295 U/L — ABNORMAL HIGH (ref 98–192)
LDH: 309 U/L — ABNORMAL HIGH (ref 98–192)

## 2019-04-25 LAB — D-DIMER, QUANTITATIVE: D-Dimer, Quant: 1.02 ug{FEU}/mL — ABNORMAL HIGH (ref 0.00–0.50)

## 2019-04-25 LAB — PROCALCITONIN: Procalcitonin: 1.86 ng/mL

## 2019-04-25 LAB — INFLUENZA PANEL BY PCR (TYPE A & B)
Influenza A By PCR: NEGATIVE
Influenza B By PCR: NEGATIVE

## 2019-04-25 LAB — TRIGLYCERIDES: Triglycerides: 104 mg/dL (ref <150)

## 2019-04-25 LAB — SARS CORONAVIRUS 2 (TAT 6-24 HRS): SARS Coronavirus 2: POSITIVE — AB

## 2019-04-25 LAB — C-REACTIVE PROTEIN: CRP: 17.4 mg/dL — ABNORMAL HIGH (ref <1.0)

## 2019-04-25 LAB — ABO/RH: ABO/RH(D): O POS

## 2019-04-25 LAB — FERRITIN: Ferritin: 219 ng/mL (ref 11–307)

## 2019-04-25 LAB — POC SARS CORONAVIRUS 2 AG -  ED: SARS Coronavirus 2 Ag: NEGATIVE

## 2019-04-25 MED ORDER — SODIUM CHLORIDE 0.9 % IV SOLN
2.0000 g | INTRAVENOUS | Status: AC
  Administered 2019-04-25: 2 g via INTRAVENOUS
  Filled 2019-04-25: qty 2

## 2019-04-25 MED ORDER — ALBUTEROL SULFATE HFA 108 (90 BASE) MCG/ACT IN AERS
2.0000 | INHALATION_SPRAY | Freq: Four times a day (QID) | RESPIRATORY_TRACT | Status: DC | PRN
  Administered 2019-04-25 – 2019-05-27 (×8): 2 via RESPIRATORY_TRACT
  Filled 2019-04-25 (×2): qty 6.7

## 2019-04-25 MED ORDER — HYDROCOD POLST-CPM POLST ER 10-8 MG/5ML PO SUER
5.0000 mL | Freq: Two times a day (BID) | ORAL | Status: DC | PRN
  Administered 2019-04-25 – 2019-05-03 (×4): 5 mL via ORAL
  Filled 2019-04-25 (×4): qty 5

## 2019-04-25 MED ORDER — METOPROLOL SUCCINATE ER 100 MG PO TB24
100.0000 mg | ORAL_TABLET | Freq: Every day | ORAL | Status: DC
  Administered 2019-04-25 – 2019-05-13 (×19): 100 mg via ORAL
  Filled 2019-04-25 (×20): qty 1

## 2019-04-25 MED ORDER — SERTRALINE HCL 50 MG PO TABS
50.0000 mg | ORAL_TABLET | Freq: Every day | ORAL | Status: DC
  Administered 2019-04-25 – 2019-05-27 (×33): 50 mg via ORAL
  Filled 2019-04-25 (×33): qty 1

## 2019-04-25 MED ORDER — VITAMIN D 25 MCG (1000 UNIT) PO TABS
1000.0000 [IU] | ORAL_TABLET | Freq: Every day | ORAL | Status: DC
  Administered 2019-04-25 – 2019-05-27 (×33): 1000 [IU] via ORAL
  Filled 2019-04-25 (×34): qty 1

## 2019-04-25 MED ORDER — METOPROLOL SUCCINATE ER 25 MG PO TB24
50.0000 mg | ORAL_TABLET | Freq: Every day | ORAL | Status: DC
  Administered 2019-04-25 – 2019-05-15 (×21): 50 mg via ORAL
  Filled 2019-04-25 (×13): qty 2
  Filled 2019-04-25: qty 1
  Filled 2019-04-25 (×6): qty 2

## 2019-04-25 MED ORDER — VITAMIN B-6 50 MG PO TABS
50.0000 mg | ORAL_TABLET | Freq: Every day | ORAL | Status: DC
  Administered 2019-04-25 – 2019-05-26 (×32): 50 mg via ORAL
  Filled 2019-04-25 (×35): qty 1

## 2019-04-25 MED ORDER — SODIUM CHLORIDE 0.9 % IV SOLN
2.0000 g | Freq: Two times a day (BID) | INTRAVENOUS | Status: DC
  Administered 2019-04-25 – 2019-04-27 (×4): 2 g via INTRAVENOUS
  Filled 2019-04-25 (×4): qty 2

## 2019-04-25 MED ORDER — ONDANSETRON HCL 4 MG/2ML IJ SOLN
4.0000 mg | Freq: Four times a day (QID) | INTRAMUSCULAR | Status: DC | PRN
  Administered 2019-04-25 – 2019-05-26 (×3): 4 mg via INTRAVENOUS
  Filled 2019-04-25 (×3): qty 2

## 2019-04-25 MED ORDER — SODIUM CHLORIDE 0.9 % IV SOLN
200.0000 mg | Freq: Once | INTRAVENOUS | Status: AC
  Administered 2019-04-25: 200 mg via INTRAVENOUS
  Filled 2019-04-25: qty 40

## 2019-04-25 MED ORDER — ZINC SULFATE 220 (50 ZN) MG PO CAPS
220.0000 mg | ORAL_CAPSULE | Freq: Every day | ORAL | Status: DC
  Administered 2019-04-25 – 2019-05-14 (×20): 220 mg via ORAL
  Filled 2019-04-25 (×20): qty 1

## 2019-04-25 MED ORDER — ASCORBIC ACID 500 MG PO TABS
500.0000 mg | ORAL_TABLET | Freq: Every day | ORAL | Status: DC
  Administered 2019-04-25 – 2019-05-27 (×33): 500 mg via ORAL
  Filled 2019-04-25 (×33): qty 1

## 2019-04-25 MED ORDER — GABAPENTIN 300 MG PO CAPS
600.0000 mg | ORAL_CAPSULE | Freq: Three times a day (TID) | ORAL | Status: DC
  Administered 2019-04-25 – 2019-05-26 (×124): 600 mg via ORAL
  Filled 2019-04-25 (×2): qty 2
  Filled 2019-04-25: qty 6
  Filled 2019-04-25 (×13): qty 2
  Filled 2019-04-25: qty 6
  Filled 2019-04-25 (×22): qty 2
  Filled 2019-04-25: qty 6
  Filled 2019-04-25 (×19): qty 2
  Filled 2019-04-25: qty 6
  Filled 2019-04-25 (×27): qty 2
  Filled 2019-04-25 (×2): qty 6
  Filled 2019-04-25 (×3): qty 2
  Filled 2019-04-25: qty 6
  Filled 2019-04-25 (×8): qty 2
  Filled 2019-04-25: qty 6
  Filled 2019-04-25 (×7): qty 2
  Filled 2019-04-25: qty 6
  Filled 2019-04-25: qty 2
  Filled 2019-04-25: qty 6
  Filled 2019-04-25 (×4): qty 2
  Filled 2019-04-25: qty 6
  Filled 2019-04-25 (×9): qty 2

## 2019-04-25 MED ORDER — ACETAMINOPHEN 325 MG PO TABS
650.0000 mg | ORAL_TABLET | Freq: Four times a day (QID) | ORAL | Status: DC | PRN
  Administered 2019-04-25 – 2019-05-25 (×12): 650 mg via ORAL
  Filled 2019-04-25 (×13): qty 2

## 2019-04-25 MED ORDER — GUAIFENESIN-DM 100-10 MG/5ML PO SYRP
10.0000 mL | ORAL_SOLUTION | ORAL | Status: DC | PRN
  Administered 2019-04-26 – 2019-05-01 (×6): 10 mL via ORAL
  Filled 2019-04-25 (×6): qty 10

## 2019-04-25 MED ORDER — IPRATROPIUM-ALBUTEROL 20-100 MCG/ACT IN AERS
1.0000 | INHALATION_SPRAY | Freq: Four times a day (QID) | RESPIRATORY_TRACT | Status: DC
  Administered 2019-04-26 – 2019-05-18 (×82): 1 via RESPIRATORY_TRACT
  Filled 2019-04-25 (×2): qty 4

## 2019-04-25 MED ORDER — SODIUM CHLORIDE 0.9 % IV SOLN
100.0000 mg | INTRAVENOUS | Status: AC
  Administered 2019-04-26 – 2019-04-29 (×4): 100 mg via INTRAVENOUS
  Filled 2019-04-25 (×4): qty 100

## 2019-04-25 MED ORDER — ATORVASTATIN CALCIUM 40 MG PO TABS
40.0000 mg | ORAL_TABLET | Freq: Every day | ORAL | Status: DC
  Administered 2019-04-25 – 2019-05-27 (×32): 40 mg via ORAL
  Filled 2019-04-25 (×7): qty 1
  Filled 2019-04-25: qty 4
  Filled 2019-04-25 (×26): qty 1

## 2019-04-25 MED ORDER — VANCOMYCIN HCL 10 G IV SOLR
2250.0000 mg | INTRAVENOUS | Status: AC
  Administered 2019-04-25: 2250 mg via INTRAVENOUS
  Filled 2019-04-25: qty 2000

## 2019-04-25 MED ORDER — ASPIRIN EC 325 MG PO TBEC
325.0000 mg | DELAYED_RELEASE_TABLET | Freq: Every day | ORAL | Status: DC
  Administered 2019-04-25 – 2019-05-27 (×32): 325 mg via ORAL
  Filled 2019-04-25 (×34): qty 1

## 2019-04-25 MED ORDER — CYCLOBENZAPRINE HCL 10 MG PO TABS
10.0000 mg | ORAL_TABLET | Freq: Three times a day (TID) | ORAL | Status: DC | PRN
  Administered 2019-04-25 – 2019-05-26 (×52): 10 mg via ORAL
  Filled 2019-04-25 (×55): qty 1

## 2019-04-25 MED ORDER — VANCOMYCIN HCL IN DEXTROSE 1-5 GM/200ML-% IV SOLN
1000.0000 mg | INTRAVENOUS | Status: DC
  Administered 2019-04-26 – 2019-04-27 (×2): 1000 mg via INTRAVENOUS
  Filled 2019-04-25 (×3): qty 200

## 2019-04-25 MED ORDER — OXYCODONE HCL 5 MG PO TABS
2.5000 mg | ORAL_TABLET | ORAL | Status: DC | PRN
  Administered 2019-04-25 – 2019-05-26 (×119): 5 mg via ORAL
  Filled 2019-04-25 (×125): qty 1

## 2019-04-25 MED ORDER — PANTOPRAZOLE SODIUM 40 MG PO TBEC
40.0000 mg | DELAYED_RELEASE_TABLET | Freq: Every day | ORAL | Status: DC
  Administered 2019-04-25 – 2019-05-27 (×33): 40 mg via ORAL
  Filled 2019-04-25 (×33): qty 1

## 2019-04-25 MED ORDER — DEXAMETHASONE SODIUM PHOSPHATE 10 MG/ML IJ SOLN
6.0000 mg | INTRAMUSCULAR | Status: DC
  Administered 2019-04-25: 08:00:00 6 mg via INTRAVENOUS
  Filled 2019-04-25: qty 1

## 2019-04-25 MED ORDER — DEXAMETHASONE SODIUM PHOSPHATE 10 MG/ML IJ SOLN
10.0000 mg | Freq: Two times a day (BID) | INTRAMUSCULAR | Status: DC
  Administered 2019-04-25 – 2019-04-26 (×2): 10 mg via INTRAVENOUS
  Filled 2019-04-25 (×2): qty 1

## 2019-04-25 MED ORDER — ENOXAPARIN SODIUM 60 MG/0.6ML ~~LOC~~ SOLN
0.5000 mg/kg | SUBCUTANEOUS | Status: DC
  Administered 2019-04-26 – 2019-05-25 (×30): 50 mg via SUBCUTANEOUS
  Filled 2019-04-25 (×31): qty 0.6

## 2019-04-25 MED ORDER — ENOXAPARIN SODIUM 40 MG/0.4ML ~~LOC~~ SOLN
40.0000 mg | SUBCUTANEOUS | Status: DC
  Administered 2019-04-25: 40 mg via SUBCUTANEOUS
  Filled 2019-04-25: qty 0.4

## 2019-04-25 NOTE — ED Notes (Signed)
Attempted report, RN to call back

## 2019-04-25 NOTE — ED Notes (Signed)
Unable to use [employer] due to lab being in chart.

## 2019-04-25 NOTE — ED Notes (Signed)
IV team unable to establish IV access 3x attempts.

## 2019-04-25 NOTE — H&P (Signed)
History and Physical    [name] [id number] DOB: 06/25/1940 DOA: 04/15/2019  PCP: Virgel Bouquet, MD Patient coming from: [facility]  Chief Complaint: Low oxygen saturation  HPI: [name] is a 78 y.o. female with medical history significant of hypertension, hyperlipidemia, TIA, GERD, anxiety, depression, arthritis presenting to the hospital via EMS from [facility] for evaluation of hypoxia.  Patient tested positive for COVID-19 on 11/6.  Staff at nursing home noted that patient had low oxygen saturations and was placed on 5 L supplemental oxygen.  Patient states she was in the Covid unit at her nursing facility for 2 weeks.  States the only symptom she was having at that time was cough which resolved a few days ago.  States at present she is feeling well and has no symptoms.  States she was sent here to the hospital by her nursing home as they were concerned her oxygen saturation is low.  She denies cough or shortness of breath.  Denies fevers, chills, body aches, fatigue, nausea, vomiting, abdominal pain, or diarrhea.  No other complaints.  ED Course: Rapid SARS-CoV-2 test negative.  Afebrile.  Oxygen saturation 76% on room air, placed on 6 L supplemental oxygen.  No leukocytosis.  Lactic acid normal.  Procalcitonin 1.86.  Blood culture x2 pending.  LFTs normal.  Inflammatory markers elevated: D-dimer 1.0, LDH 295, fibrinogen 701, and CRP 17.4.  Due to high suspicion for COVID-19 pneumonia, repeat test has been ordered and in process at this time.  Chest x-ray showing multifocal areas of airspace opacity most pronounced in the right lung and in the left lung base.  Review of Systems:  All systems reviewed and apart from history of presenting illness, are negative.  Past Medical History:  Diagnosis Date   Anxiety    Arthritis    djd   Blood transfusion without reported diagnosis    Cataracts, bilateral    immature    Cellulitis    10/11/11 hospitalized for cellulitis     Chicken pox    Chronic back pain    scoliosis/stenosis   Depression    takes Zoloft daily   Elbow fracture, left April 2015   GERD (gastroesophageal reflux disease)    hx pud '98   Glaucoma    Hyperlipemia    takes Atorvastatin daily   Hypertension    takes HCTZ daily   Insomnia    Migraine    Peripheral neuropathy    PPD positive, treated 1987   tx'd x 1 yr w/ inh   Radiculopathy, lumbar region    Restless leg syndrome    takes Sinemet daily   TIA (transient ischemic attack)    Possible TIA per 2013 DC summary   Vertigo     Past Surgical History:  Procedure Laterality Date   ABDOMINAL HYSTERECTOMY  1975   bil oophorectomy   APPENDECTOMY     BACK SURGERY   MANY YRS AGO   laminectomy x3   CARPAL TUNNEL RELEASE  07/09/2012   Procedure: CARPAL TUNNEL RELEASE;  Surgeon: Magnus Sinning, MD;  Location: [facility];  Service: Orthopedics;  Laterality: Left;   cervical disc surgery x2  2011   CHOLECYSTECTOMY  1993   colonosocpy     ESOPHAGOGASTRODUODENOSCOPY     FINGER ARTHROPLASTY  07/09/2012   Procedure: FINGER ARTHROPLASTY;  Surgeon: Magnus Sinning, MD;  Location: [facility];  Service: Orthopedics;  Laterality: Left;  Interposition Arthroplasty CMC Joint Thumb Left    HEMATOMA EVACUATION  2011   s/p rt cea   JOINT REPLACEMENT  '01   total knee replacement, LEFT   left knee arthroscopy  2001   LIPOMA EXCISION  07/09/2012   Procedure: EXCISION LIPOMA;  Surgeon: Magnus Sinning, MD;  Location: [facility];  Service: Orthopedics;  Laterality: Left;  Excision Lipoma Dorsum Left Wrist    LUMBAR LAMINECTOMY WITH COFLEX 1 LEVEL Bilateral 04/19/2015   Procedure: LUMBAR TWO-THREE LUMBAR LAMINECTOMY WITH COFLEX ;  Surgeon: Kristeen Miss, MD;  Location: [place] [facility];  Service: Neurosurgery;  Laterality: Bilateral;  Bilateral L23 laminectomy and foraminotomy with coflex   LUMBAR LAMINECTOMY/DECOMPRESSION MICRODISCECTOMY Right 12/19/2015   Procedure: Right Lumbar  two-three  Microdiskectomy;  Surgeon: Kristeen Miss, MD;  Location: [place] [facility];  Service: Neurosurgery;  Laterality: Right;   ORIF ANKLE FRACTURE Right 09/14/2014   FIBULA    ORIF ANKLE FRACTURE Right 09/14/2014   Procedure: OPEN REDUCTION INTERNAL FIXATION (ORIF) RIGHT ANKLE FRACTURE/SYNDESMOSIS ;  Surgeon: Wylene Simmer, MD;  Location: [place];  Service: Orthopedics;  Laterality: Right;   right knee replacement     09/2011    rt carotid enarterectomy  2011   rt knee arthroscopy  '99   SHOULDER ARTHROSCOPY Right    TOTAL KNEE ARTHROPLASTY  09/13/2011   Procedure: TOTAL KNEE ARTHROPLASTY;  Surgeon: Magnus Sinning, MD;  Location: [facility];  Service: Orthopedics;  Laterality: Right;     reports that she has never smoked. She has never used smokeless tobacco. She reports that she does not drink alcohol or use drugs.  Allergies  Allergen Reactions   Contrast Media [Iodinated Diagnostic Agents] Anaphylaxis    Pt denies allergy as of 08/13/2018.    Sulfa Drugs Cross Reactors Nausea And Vomiting    Family History  Problem Relation Age of Onset   Congestive Heart Failure Father    Arthritis Father    Hearing loss Father    Heart disease Father    Congestive Heart Failure Sister    Arthritis Sister    COPD Sister    Depression Sister    Early death Sister    Hypertension Sister    Alzheimer's disease Mother    Arthritis Mother    Diabetes Brother    Cancer Brother        adrenal gland   Testicular cancer Brother    Arthritis Brother    Heart disease Brother    Hyperlipidemia Brother    Hypertension Brother    Arthritis Brother    Cancer Brother        prostate   Diabetes Brother    Prostate cancer Brother    Hypertension Brother    Early death Maternal Grandmother    Early death Maternal Grandfather    Early death Paternal Grandmother    Early death Paternal Grandfather     Prior to Admission medications   Medication Sig Start Date End  Date Taking? Authorizing Provider  amLODipine (NORVASC) 10 MG tablet Take 1 tablet (10 mg total) by mouth at bedtime. 04/11/18  Yes Nche, Charlene Brooke, NP  aspirin EC 325 MG tablet Take 1 tablet (325 mg total) by mouth daily. 07/20/16  Yes [name], [name] L, DO  atorvastatin (LIPITOR) 40 MG tablet Take 1 tablet (40 mg total) by mouth daily at 6 PM. 04/11/18  Yes Nche, Charlene Brooke, NP  cholecalciferol (VITAMIN D) 1000 units tablet Take 1,000 Units by mouth daily.   Yes [provider]  cyclobenzaprine (FLEXERIL) 10 MG tablet Take 10  mg by mouth 3 (three) times daily as needed for muscle spasms. 06/04/18  Yes [provider]  gabapentin (NEURONTIN) 600 MG tablet Take 1 tablet (600 mg total) by mouth 4 (four) times daily. Patient taking differently: Take 600 mg by mouth 4 (four) times daily. With meals and at bedtime 05/05/18  Yes Nche, Charlene Brooke, NP  losartan (COZAAR) 100 MG tablet Take 1 tablet (100 mg total) by mouth daily. 04/11/18  Yes Nche, Charlene Brooke, NP  metoprolol succinate (TOPROL-XL) 100 MG 24 hr tablet TAKE 1 TABLET EVERY DAY, TAKE WITH 50 MG TABLET 04/11/18  Yes Nche, Charlene Brooke, NP  metoprolol succinate (TOPROL-XL) 50 MG 24 hr tablet TAKE 1 TABLET EVERY DAY ALONG WITH 100 MG TABLET 04/11/18  Yes Nche, Charlene Brooke, NP  potassium chloride SA (KLOR-CON) 20 MEQ tablet Take 20 mEq by mouth daily. 04/20/19  Yes [provider]  pyridOXINE (VITAMIN B-6) 50 MG tablet Take 50 mg by mouth daily.   Yes [provider]  sertraline (ZOLOFT) 50 MG tablet Take 1 tablet (50 mg total) by mouth daily. 04/11/18  Yes Nche, Charlene Brooke, NP  tiZANidine (ZANAFLEX) 4 MG tablet Take 4 mg by mouth 3 (three) times daily. 04/19/19  Yes [provider]  traZODone (DESYREL) 50 MG tablet Take 50 mg by mouth at bedtime. 04/21/19  Yes [provider]  acetaminophen (TYLENOL) 650 MG CR tablet Take 1,300 mg by mouth 2 (two) times daily as needed for pain.     [provider]  amoxicillin-clavulanate (AUGMENTIN) 875-125 MG tablet Take 1 tablet by mouth 2 (two) times daily. One po bid x 7 days 08/09/18   Charlesetta Shanks, MD  methocarbamol (ROBAXIN) 500 MG tablet Take 1 tablet (500 mg total) by mouth every 12 (twelve) hours as needed for muscle spasms. Patient not taking: Reported on 06/17/2018 12/23/17   Nche, Charlene Brooke, NP  oxyCODONE (ROXICODONE) 5 MG immediate release tablet Take 0.5-1 tablets (2.5-5 mg total) by mouth every 4 (four) hours as needed for severe pain. 08/13/18   Harris, Vernie Shanks, PA-C  pantoprazole (PROTONIX) 20 MG tablet Take 1 tablet (20 mg total) by mouth daily. Patient not taking: Reported on 06/17/2018 04/11/18   Nche, Charlene Brooke, NP  sodium chloride (OCEAN) 0.65 % SOLN nasal spray Place 1 spray into both nostrils as needed for congestion. 06/28/15   Lindell Spar I, NP  SUMAtriptan (IMITREX) 50 MG tablet TAKE 1 TABLET BY MOUTH AS NEEDED FOR MIGRAINES/HEADACHES. DO NOT EXCEED 2 TABLETS IN 24 HOURS 06/10/18   Nche, Charlene Brooke, NP  valACYclovir (VALTREX) 1000 MG tablet Take 1 tablet (1,000 mg total) by mouth 3 (three) times daily. 08/13/18   Harris, Vernie Shanks, PA-C  potassium chloride 20 MEQ TBCR Take 20 mEq by mouth daily. 08/07/14 08/07/14  Larene Pickett, PA-C    Physical Exam: Vitals:   04/25/19 0500 04/25/19 0530 04/25/19 0600 04/25/19 0630  BP: 101/73 128/81 (!) 146/80 133/87  Pulse: 76 71 71 73  Resp: 15 15 15 18   Temp:      TempSrc:      SpO2: 97% 96% 96% 95%  Weight:      Height:        Physical Exam  Constitutional: [name] oriented to person, place, and time. She appears well-developed and well-nourished. No distress.  HENT:  Head: Normocephalic.  Eyes: Right eye exhibits no discharge. Left eye exhibits no discharge.  Neck: Neck supple.  Cardiovascular: Normal rate, regular rhythm and intact distal  pulses.  Pulmonary/Chest: Effort normal. She has no wheezes.  On 2 L supplemental oxygen Coarse breath sounds  appreciated at the bases  Abdominal: Soft. Bowel sounds are normal. She exhibits no distension. There is no abdominal tenderness. There is no guarding.  Musculoskeletal:        General: No edema.  Neurological: [name] alert and oriented to person, place, and time.  Skin: Skin is warm and dry. [name] not diaphoretic.     Labs on Admission: I have personally reviewed following labs and imaging studies  CBC: Recent Labs  Lab 04/25/19 0030  WBC 8.4  NEUTROABS 5.5  HGB 11.9*  HCT 38.4  MCV 100.3*  PLT 0000000   Basic Metabolic Panel: Recent Labs  Lab 04/25/19 0030  NA 134*  K 4.7  CL 104  CO2 22  GLUCOSE 123*  BUN 22  CREATININE 1.01*  CALCIUM 8.2*   GFR: Estimated Creatinine Clearance: 55.2 mL/min (A) (by C-G formula based on SCr of 1.01 mg/dL (H)). Liver Function Tests: Recent Labs  Lab 04/25/19 0030  AST 24  ALT 8  ALKPHOS 96  BILITOT 0.3  PROT 6.2*  ALBUMIN 2.1*   No results for input(s): LIPASE, AMYLASE in the last 168 hours. No results for input(s): AMMONIA in the last 168 hours. Coagulation Profile: No results for input(s): INR, PROTIME in the last 168 hours. Cardiac Enzymes: No results for input(s): CKTOTAL, CKMB, CKMBINDEX, TROPONINI in the last 168 hours. BNP (last 3 results) No results for input(s): PROBNP in the last 8760 hours. HbA1C: No results for input(s): HGBA1C in the last 72 hours. CBG: No results for input(s): GLUCAP in the last 168 hours. Lipid Profile: Recent Labs    04/25/19 0030  TRIG 104   Thyroid Function Tests: No results for input(s): TSH, T4TOTAL, FREET4, T3FREE, THYROIDAB in the last 72 hours. Anemia Panel: Recent Labs    04/25/19 0225  FERRITIN 219   Urine analysis:    Component Value Date/Time   COLORURINE STRAW (A) 08/09/2018 2049   APPEARANCEUR CLEAR 08/09/2018 2049   LABSPEC 1.009 08/09/2018 2049   PHURINE 6.0 08/09/2018 2049   GLUCOSEU NEGATIVE 08/09/2018 2049   HGBUR NEGATIVE 08/09/2018 2049   BILIRUBINUR  NEGATIVE 08/09/2018 2049   BILIRUBINUR neg 07/20/2016 [place] 08/09/2018 2049   PROTEINUR NEGATIVE 08/09/2018 2049   UROBILINOGEN negative 07/20/2016 1219   UROBILINOGEN 0.2 09/18/2014 1257   NITRITE NEGATIVE 08/09/2018 2049   LEUKOCYTESUR TRACE (A) 08/09/2018 2049    Radiological Exams on Admission: Dg Chest Port 1 View  Result Date: 04/05/2019 CLINICAL DATA:  COVID-19 with hypoxemia EXAM: PORTABLE CHEST 1 VIEW COMPARISON:  Radiograph 06/17/2018, CT chest 09/17/2014, CT cervical spine 06/17/2018 FINDINGS: Multifocal areas of airspace opacity most pronounced in the right lung and in the left lung base. No pneumothorax. No visible effusion. Cardiomediastinal contours are similar to prior including abundant mediastinal fat seen on prior CT. Degenerative changes are present in the imaged spine and shoulders. Cervical fusion hardware is noted in similar appearance to comparison CT of the cervical spine though incompletely assessed on this exam. IMPRESSION: Multifocal areas of airspace opacity most pronounced in the right lung and in the left lung base concerning for multifocal pneumonia in the setting of COVID-19 positivity. Electronically Signed   By: Lovena Le M.D.   On: 04/14/2019 22:55    EKG: Independently reviewed.  Sinus rhythm, T wave inversions in inferior and lateral lead similar to prior tracing from January 2020.  Assessment/Plan Principal Problem:   Multifocal pneumonia Active Problems:   Depression   Acute respiratory failure with hypoxia (HCC)   Hyperlipidemia   Essential hypertension   Acute hypoxic respiratory failure secondary to multifocal pneumonia, suspicion for COVID-19 viral infection Tested positive for COVID-19 on 11/6.  Rapid SARS-CoV-2 test done in the ED negative.  However, there remains suspicion for COVID-19 viral infection given hypoxia and elevated inflammatory markers.  Oxygen saturation 76% on room air, placed on 6 L supplemental oxygen.   No increased work of breathing.  Afebrile and no leukocytosis.  Lactic acid normal. Inflammatory markers elevated: D-dimer 1.0, LDH 295, fibrinogen 701, and CRP 17.4.  Chest x-ray showing multifocal areas of airspace opacity most pronounced in the right lung and in the left lung base. -Repeat SARS-CoV-2 test pending -Check RVP and influenza panel to rule out other viral etiologies -Procalcitonin elevated.  Start vancomycin and cefepime for coverage of possible HCAP. -IV Decadron 6 mg daily -Vitamin C and zinc -Antitussives as needed -Tylenol as needed -Inhaler as needed -Daily CBC with differential, CMP, CRP, D-dimer, LDH -Airborne and contact precautions -Continuous pulse ox -Supplemental oxygen as needed to keep oxygen saturation above 90% -Blood culture x2 pending  Hypertension -Continue metoprolol  Hyperlipidemia -Continue Lipitor  Depression -Continue home Zoloft  DVT prophylaxis: Lovenox Code Status: Patient wishes to be full code. Family Communication: No family available. Disposition Plan: Anticipate discharge after clinical improvement. Consults called: None Admission status: It is my clinical opinion that admission to INPATIENT is reasonable and necessary in this 78 y.o. female  presenting with acute hypoxic respiratory failure secondary to multifocal pneumonia, suspicion for COVID-19 viral infection.  High risk of decompensation.  Given the aforementioned, the predictability of an adverse outcome is felt to be significant. I expect that the patient will require at least 2 midnights in the hospital to treat this condition.   The medical decision making on this patient was of high complexity and the patient is at high risk for clinical deterioration, therefore this is a level 3 visit.  Shela Leff MD [facility] Pager [phone]  If 7PM-7AM, please contact night-coverage [email or web address] Password [facility]  04/25/2019, 6:42 AM

## 2019-04-25 NOTE — Progress Notes (Signed)
Pharmacy Antibiotic Note  [name] is a 78 y.o. female admitted on 04/27/2019 with pneumonia.  Pharmacy has been consulted for Vancomycin and Cefepime dosing.  Plan: Vancomycin 2250mg  IV x 1, then 1g IV q24h  Vancomycin levels at steady state, as indicated Cefepime 2g IV q12h Monitor renal function, cultures, clinical course  Height: 5\' 5"  (165.1 cm) Weight: 225 lb (102.1 kg) IBW/kg (Calculated) : 57  Temp (24hrs), Avg:98.2 F (36.8 C), Min:98.2 F (36.8 C), Max:98.2 F (36.8 C)  Recent Labs  Lab 04/25/19 0030  WBC 8.4  CREATININE 1.01*  LATICACIDVEN 1.5    Estimated Creatinine Clearance: 55.2 mL/min (A) (by C-G formula based on SCr of 1.01 mg/dL (H)).    Allergies  Allergen Reactions  . Contrast Media [Iodinated Diagnostic Agents] Anaphylaxis    Pt denies allergy as of 08/13/2018.   . Sulfa Drugs Cross Reactors Nausea And Vomiting    Antimicrobials this admission: 11/21 Vancomycin >> 11/21 Cefepime >>  Dose adjustments this admission: --  Microbiology results: 11/21 BCx: sent 11/21 COVID: sent 11/21 Respiratory panel by PCR: ordered 11/21 Influenza panel by PCR: ordered  Thank you for allowing pharmacy to be a part of this patient's care.  Luiz Ochoa 04/25/2019 7:02 AM

## 2019-04-25 NOTE — ED Notes (Addendum)
While obtaining IV access, pt states that the sheets felt wrinkled beneath her. She was able to roll on each side to allow me to smooth them. No other complaints at this time.

## 2019-04-25 NOTE — ED Notes (Signed)
RN went in to assist patient after she had called out for the 4th time in the last 30 minutes. RN asked patient politely to please have the mask on her face when staff where in the room as she was being tested for COVID-19. Patient scoffed at RN and stated "I have already had COVID." RN explained that it is possible to get it again. Patient became agitated when putting mask on saying "Well you guys just don't help anyone do you?" referring to assisting in putting her mask back on.  RN explained to patient that we are more than willing to assist the patient, however due to the nature of the current pandemic we attempt to cluster care. 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 And it's not like you just hit the button for the hell of it" This RN attempted to deescalate the patient and explained that no one was accusing her of hitting the button for no reason, however could she think of anything else she needed at this time. Patient stated she did not need anything

## 2019-04-25 NOTE — ED Notes (Signed)
Pt placed on bedside commode for BM. Pt continues to remove her nasal cannula despite multiple requests and reminders to put it on. Pt states "I can't blow my nose with it on". Pt continues to refuse to wear mask, despite multiple requests from multiple caregivers.

## 2019-04-25 NOTE — Progress Notes (Signed)
Care started prior to midnight in the emergency room and patient was admitted early this morning after midnight by my partner colleague Dr. Shela Leff and I am in current agreement with her assessment and plan.  Additional changes to the plan of care have been made accordingly.  Essentially the patient is a 78 year old obese Caucasian female with a past medical history significant for but not limited to hypertension, hyperlipidemia, history of TIA, GERD, anxiety and depression, arthritis as well as other comorbidities who presented to the hospital from her facility of [facility] for evaluation of hypoxia.  She tested positive for COVID-19 disease on 04/09/2018 and at that time she only had symptoms of cough which is essentially resolved.  Yesterday staff noticed that patient had low oxygen saturations and was placed on 5 L supplemental oxygen.  She was in her Covid unit at her nursing facility for 2 weeks and was feeling overall well with no symptoms besides a cough and she was sent to the hospital by her nursing facility due concern for her low oxygen saturations.  In the ED she had a rapid SARS-CoV-2 testing which was negative however her oxygen saturation was 76% on room air and was placed on 6 L supplemental oxygen via nasal cannula.  She had inflammatory markers were elevated and a slight procalcitonin level of 1.86.  The chest x-ray done in the emergency room showed multifocal areas of airspace opacity most pronounced in the right lung and the left lung base.  [name] admitted for the following and being treated for but not limited to:  Acute hypoxic respiratory failure secondary to multifocal pneumonia likely COVID-19 viral infection and rule out bacterial coinfection -Tested positive for COVID-19 on 11/6.   -Rapid SARS-CoV-2 test done in the ED negative.  However, there remains suspicion for COVID-19 viral infection given hypoxia and elevated inflammatory markers.   -Oxygen saturation 76% on room  air, placed on 6 L supplemental oxygen.  No increased work of breathing.   -Afebrile and no leukocytosis.   -Lactic acid normal. Inflammatory markers elevated: D-dimer 1.0, LDH 295, fibrinogen 701, and CRP 17.4.   -Chest x-ray showing multifocal areas of airspace opacity most pronounced in the right lung and in the left lung base. -Repeat SARS-CoV-2 test pending -Check RVP and influenza panel to rule out other viral etiologies -Procalcitonin elevated.  Start vancomycin and cefepime for coverage of possible HCAP given that she was in a nursing facility but low threshold to de-escalate -IV Decadron 6 mg daily started -We will start remdesivir as well to help Reduce Symptom length  -Trend inflammatory markers daily and repeat chest x-ray in a.m. -Vitamin C and Zinc -Antitussives as needed -Tylenol as needed -Albuterol Inhaler as needed and will start Scheduled Combivent  -Airborne and contact precautions -Continuous pulse ox -Supplemental oxygen as needed to keep oxygen saturation above 90% -Blood culture x2 pending  Hypertension -Continue Metoprolol  Hyperlipidemia -Continue Lipitor  Depression and Anxiety  -Continue home Zoloft  GERD -Patient no longer taking pantoprazole 20 mg p.o. daily  Hyponatremia -Mild at 134 We will continue to monitor and trend and repeat CMP in a.m.  Hyperglycemia -Check hemoglobin A1c in the morning -Continue monitor and trend blood sugars carefully and if necessary will need to place on Sensitive NovoLog sliding scale insulin  Obesity -Estimated body mass index is 37.44 kg/m as calculated from the following:   Height as of this encounter: 5\' 5"  (1.651 m).   Weight as of this encounter: 102.1  kg. -Weight Loss and Dietary Counseling given   Renal Insufficiency /? CKD Stage 3 -Patient BUN/creatinine on admission was 22/1.01 -Baseline appears to be around 0.7-1.1 however -Avoid nephrotoxic medications, contrast dyes, hypotension as well as  renally dose medications -Repeat CMP in AM   We will continue to monitor patient's clinical response to intervention and repeat and trend inflammatory markers and repeat a chest x-ray in a.m.

## 2019-04-25 NOTE — Plan of Care (Signed)
  Problem: Education: Goal: Knowledge of risk factors and measures for prevention of condition will improve Outcome: Progressing   Problem: Coping: Goal: Psychosocial and spiritual needs will be supported Outcome: Progressing   Problem: Respiratory: Goal: Will maintain a patent airway Outcome: Progressing Goal: Complications related to the disease process, condition or treatment will be avoided or minimized Outcome: Progressing   

## 2019-04-25 NOTE — Progress Notes (Signed)
Attempted to call pt's son, Donovan Kail to inform him of his mother's arrival to [place]. Voicemail left at [phone] giving him the number to his mother's room directly so he could call her. Assigned RN made aware.

## 2019-04-26 ENCOUNTER — Inpatient Hospital Stay (HOSPITAL_COMMUNITY): Payer: Medicare HMO

## 2019-04-26 LAB — PROCALCITONIN: Procalcitonin: 0.5 ng/mL

## 2019-04-26 LAB — COMPREHENSIVE METABOLIC PANEL
ALT: 10 U/L (ref 0–44)
AST: 33 U/L (ref 15–41)
Albumin: 2.3 g/dL — ABNORMAL LOW (ref 3.5–5.0)
Alkaline Phosphatase: 114 U/L (ref 38–126)
Anion gap: 18 — ABNORMAL HIGH (ref 5–15)
BUN: 18 mg/dL (ref 8–23)
CO2: 13 mmol/L — ABNORMAL LOW (ref 22–32)
Calcium: 9.1 mg/dL (ref 8.9–10.3)
Chloride: 109 mmol/L (ref 98–111)
Creatinine, Ser: 0.8 mg/dL (ref 0.44–1.00)
GFR calc Af Amer: >60 mL/min (ref >60)
GFR calc non Af Amer: >60 mL/min (ref >60)
Glucose, Bld: 172 mg/dL — ABNORMAL HIGH (ref 70–99)
Potassium: 5.8 mmol/L — ABNORMAL HIGH (ref 3.5–5.1)
Sodium: 140 mmol/L (ref 135–145)
Total Bilirubin: 0.9 mg/dL (ref 0.3–1.2)
Total Protein: 6.8 g/dL (ref 6.5–8.1)

## 2019-04-26 LAB — FERRITIN: Ferritin: 276 ng/mL (ref 11–307)

## 2019-04-26 LAB — C-REACTIVE PROTEIN: CRP: 19 mg/dL — ABNORMAL HIGH (ref <1.0)

## 2019-04-26 LAB — D-DIMER, QUANTITATIVE: D-Dimer, Quant: 1.08 ug/mL-FEU — ABNORMAL HIGH (ref 0.00–0.50)

## 2019-04-26 MED ORDER — SODIUM POLYSTYRENE SULFONATE 15 GM/60ML PO SUSP
15.0000 g | Freq: Once | ORAL | Status: AC
  Administered 2019-04-26: 15 g via ORAL
  Filled 2019-04-26: qty 60

## 2019-04-26 MED ORDER — SALINE SPRAY 0.65 % NA SOLN
1.0000 | NASAL | Status: DC | PRN
  Administered 2019-04-26 – 2019-05-26 (×4): 1 via NASAL
  Filled 2019-04-26: qty 44

## 2019-04-26 MED ORDER — PNEUMOCOCCAL VAC POLYVALENT 25 MCG/0.5ML IJ INJ
0.5000 mL | INJECTION | INTRAMUSCULAR | Status: DC
  Filled 2019-04-26: qty 0.5

## 2019-04-26 MED ORDER — DEXAMETHASONE SODIUM PHOSPHATE 10 MG/ML IJ SOLN: 6.0000 mg | Freq: Two times a day (BID) | INTRAMUSCULAR | Status: DC

## 2019-04-26 MED ORDER — ZOLPIDEM TARTRATE 5 MG PO TABS
5.0000 mg | ORAL_TABLET | Freq: Every evening | ORAL | Status: DC | PRN
  Administered 2019-04-26 – 2019-04-27 (×2): 5 mg via ORAL
  Filled 2019-04-26 (×2): qty 1

## 2019-04-26 MED ORDER — ORAL CARE MOUTH RINSE
15.0000 mL | Freq: Two times a day (BID) | OROMUCOSAL | Status: DC
  Administered 2019-04-26 – 2019-05-27 (×59): 15 mL via OROMUCOSAL

## 2019-04-26 MED ORDER — DILTIAZEM HCL 25 MG/5ML IV SOLN
10.0000 mg | Freq: Once | INTRAVENOUS | Status: AC
  Administered 2019-04-26: 05:00:00 10 mg via INTRAVENOUS
  Filled 2019-04-26: qty 5

## 2019-04-26 MED ORDER — SODIUM CHLORIDE 0.9 % IV SOLN
INTRAVENOUS | Status: AC
  Administered 2019-04-26: 12:00:00 via INTRAVENOUS

## 2019-04-26 MED ORDER — DEXAMETHASONE SODIUM PHOSPHATE 10 MG/ML IJ SOLN
6.0000 mg | Freq: Two times a day (BID) | INTRAMUSCULAR | Status: DC
  Administered 2019-04-26 – 2019-05-01 (×11): 6 mg via INTRAVENOUS
  Administered 2019-05-01: 08:00:00 via INTRAVENOUS
  Administered 2019-05-02 – 2019-05-06 (×9): 6 mg via INTRAVENOUS
  Filled 2019-04-26 (×21): qty 1

## 2019-04-26 NOTE — Progress Notes (Signed)
I received report from day nurse and let patient know I would be back after getting report.  At 2015, standing outside of room 33 I saw pt call light come on.  When I entered the room, the patient stated she needed a way to get in touch with me.  I advised her she could use the call light.  She stated that wasn't good enough because she had pressed the button and no one came for a long time.  I advised pt that I saw her call light come on and came immediately to her room.    I took VS, assisted pt to bedside commode, changed her wet sheet and pad, replaced her pulse ox - moved to left hand, wrapped her IV, and other tasks as needed/requested.  Pt requested pain medication and saline nasal spray.  I paged MD for order.  I let pt know I would be back around 10 pm with night meds.    At 2220, I received call from secretary that patient called for her night meds.  I told the secretary I was with a patient and couldn't come right away.  I was in room 133: the patient in that room is very sick.  10-15 minutes later she called desk again and said she "had a whoopsie and wet the bed".  The secretary called me to tell me that, I asked and she agreed that patient had not stated she needed to go to the bathroom when she called out the 1st time.  I told the secretary I would be there as soon as I could.  I finished with patient in 133 and went to collect pt meds.    While I was walking to her room, another RN told me the patient had been yelling and when she went in pt said she needed the bathroom and no one would come.  The RN placed the purewick.  She did not report that patient had wet the bed.  She did report that patient was calling her son and telling him she was being ignored and not being provided good care.    When I entered, patient complained she had waited too long.  I let her know that I came as quickly as I could, that I prioritized which patient I saw first by how sick they are, that she was not my only  patient - there were 4 total on my assignment, and that I wouldn't be pushed too fast because I didn't want to make mistakes.  She stated she was getting poor care, that she received better care someplace else and that nurse had cared for 12 patients and provided better care.  I advised patient that she needed to have realistic expectations of response time and if she felt she needed an instant response to her needs, she should call her son and tell him she needed a private duty nurse.  I also told her I am a good nurse, that I understood she doesn't feel good, and that I did not like being told I wasn't.  She stated she hadn't said that, to which I replied she said she was getting poor care, which she acknowledged.  .    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  I asked her which patient she saw first when she worked and she acknowledged she took care of the sickest patient  first.  I gave patient all of her medication and let her know to press her call button if she needed me.    I then received message from secretary to call the patient's son.  I called and he reported she had said that no one had been in to her room to see her since she had arrived.  That she had history of migraines and had a headache and was never given medication (Tylenol was given on day shift).  That she was not receiving good care, that we were not answering call lights, were not giving her medicine, and more.  He said "we understand, we know she can be difficult".  I then told her son what had actually occurred.  He said she was calling him every 10 minutes, crying and complaining and that he has to work tomorrow.  I offered and he agreed to unplugging her phone until the morning.    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  He reports she would pharmacy hop and doctor hop to get pain meds and had called neighbors asking if they had any  painkillers.  When she was told no, she would ask her neighbors to call people they know to see if they had painkillers.  He reported he believes the facility she was staying at had taken care of the problem.  He asked that we be very careful with the narcotics because of this history.  The patient has exhibited drug seeking behavior throughout the night.    He reports she would call 911 multiple times a week when she was living at home.  He states APS has been contacted once because she could not care for herself or her home and that local authorities had threatened condemnation of her home.  The second time APS was brought up - for the same reason -  the sons made her go to an assisted living facility.  Apparently, the sons had the same issues with her when she first went to assisted living.    I suggested a psych consult.  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  He agrees that she has a psych problem and would like her evaluated if possible.    This patient is alert and oriented.  [name] VERY difficult, is demanding, rude and manipulative.  I have observed her pretend to cry, pretend to wet herself, lie to her children, and report pain whenever you touch her.  She does not hesitate to insult staff in attempts to get what she wants.  Charge nurse made aware of situation.  These issues were reported to the oncoming nurse in bedside report.    Patient blood pressure is uncontrolled - reported to day nurse this needs to be addressed.

## 2019-04-26 NOTE — Plan of Care (Signed)

## 2019-04-26 NOTE — Progress Notes (Addendum)
[facility] PROGRESS NOTE    Progress Note  [name]  [id number] DOB: 08-17-1940 DOA: 04/09/2019 PCP: Virgel Bouquet, MD     Brief Narrative:   [name] is an 78 y.o. female past medical history significant for hypertension TIA anxiety depression presents to the hospital from [facility] via EMS for hypoxia she tested positive for COVID-19 on 04/10/2019 staff at the nursing home check her saturations and it was low she was placed on 5 L of oxygen.  In the ED she was found to be hypoxic.,  Inflammatory markers were elevated chest x-ray shows multifocal airspace disease most pronounced at the right and lung base high suspicion for Covid-19.  Assessment/Plan:   Acute respiratory failure with hypoxia due to COVID19 PNA: [name] currently satting greater than 90% on 6 L of oxygen. Given empiric antibiotics on admission procalcitonin went from 1.8-0.5 , she has no leukocytosis lactic acid was 1.5. SARS-CoV-2 test was positive on 04/25/2019.  Inflammatory markers are significantly elevated. Continue IV remdesivir and dexamethasone.  Continue vitamin C and zinc, continue vancomycin and cefepime she is more than 12 days out from her initial SARS-CoV-2 positive PCR. Procalcitonin was 0.5 on admission. Culture data is pending. She relates she does not feel good, she is also the satting when she talks and cannot speak in full sentences. We will keep a close eye on her. Inflammatory markers are significantly elevated we will continue to trend.  AKI: With a baseline creatinine of less than 1 on admission 1.2 in the setting of ARB decreased oral intake. Her ARB was held she was started on normal saline for the next 24 hours monitor strict I's and O's. Her creatinine has improved to 0.8 we will continue to follow intermittently.  Mild Hyperkalemia: Mild hyperkalemia in the setting of ARB and hypovolemia. ARB has been held, will start her on normal saline for the next 24 hours  monitor strict I's and O's. We will give oral Kayexalate and recheck a basic metabolic panel in the morning.  Essential hypertension: Blood pressure fairly controlled continue metoprolol.  Hyperlipidemia: Continue Lipitor.  Depression Continue home Zoloft.    DVT prophylaxis: lovenox Family Communication:none Disposition Plan/Barrier to D/C: once she finishes her remdevir and is off oxygen. Code Status:     Code Status Orders  (From admission, onward)         Start     Ordered   04/25/19 0432  Full code  Continuous     04/25/19 0433        Code Status History    Date Active Date Inactive Code Status Order ID Comments User Context   12/19/2015 1617 12/21/2015 2133 Full Code [id number]  Kristeen Miss, MD Inpatient   07/11/2015 0958 07/14/2015 1833 Full Code [id number]  Rondel Jumbo, PA-C ED   07/11/2015 0958 07/11/2015 0958 Full Code [id number]  Rondel Jumbo, PA-C ED   07/01/2015 2317 07/04/2015 1856 Full Code [id number]  Ivor Costa, MD ED   06/16/2015 1638 06/29/2015 0855 Full Code [id number]  Delfin Gant, NP Inpatient   06/15/2015 1411 06/16/2015 1638 Full Code [id number]  Virgel Manifold, MD ED   04/19/2015 1831 04/21/2015 1844 Full Code [id number]  Kristeen Miss, MD Inpatient   09/14/2014 1801 09/20/2014 1827 Full Code [id number]  Wylene Simmer, MD Inpatient   08/07/2014 0649 08/10/2014 1733 Full Code [id number]  Larene Pickett, PA-C ED   10/12/2011 0030 10/14/2011 1616 Full Code [id number]  Joycelyn Man, RN Inpatient  Advance Care Planning Activity    Advance Directive Documentation     Most Recent Value  Type of Advance Directive  Healthcare Power of Attorney, Living will  Pre-existing out of facility DNR order (yellow form or pink MOST form)  -  "MOST" Form in Place?  -        IV Access:    Peripheral IV   Procedures and diagnostic studies:   Dg Chest Port 1 View  Result Date: 04/26/2019 CLINICAL DATA:  COVID-19 EXAM: PORTABLE CHEST 1 VIEW  COMPARISON:  Two days ago FINDINGS: Low volume chest with asymmetric right diaphragmatic elevation. Interstitial coarsening and patchy airspace opacity. No visible effusion or pneumothorax. Cardiomegaly. IMPRESSION: Stable low volume chest with bilateral pneumonia. There is cardiomegaly and diffuse interstitial opacity, question developing edema. Electronically Signed   By: Monte Fantasia M.D.   On: 04/26/2019 06:40   Dg Chest Port 1 View  Result Date: 04/19/2019 CLINICAL DATA:  COVID-19 with hypoxemia EXAM: PORTABLE CHEST 1 VIEW COMPARISON:  Radiograph 06/17/2018, CT chest 09/17/2014, CT cervical spine 06/17/2018 FINDINGS: Multifocal areas of airspace opacity most pronounced in the right lung and in the left lung base. No pneumothorax. No visible effusion. Cardiomediastinal contours are similar to prior including abundant mediastinal fat seen on prior CT. Degenerative changes are present in the imaged spine and shoulders. Cervical fusion hardware is noted in similar appearance to comparison CT of the cervical spine though incompletely assessed on this exam. IMPRESSION: Multifocal areas of airspace opacity most pronounced in the right lung and in the left lung base concerning for multifocal pneumonia in the setting of COVID-19 positivity. Electronically Signed   By: Lovena Le M.D.   On: 05/04/2019 22:55     Medical Consultants:    None.  Anti-Infectives:   IV remdesivir  Subjective:    [name]   Objective:    Vitals:   04/26/19 0412 04/26/19 0417 04/26/19 0500 04/26/19 0600  BP: (!) 198/95 (!) 179/167 (!) 184/72 (!) 166/89  Pulse: (!) 112 (!) 114 (!) 112 (!) 116  Resp: 19 19 19 16   Temp:      TempSrc:      SpO2: 91% 91% (!) 79% 90%  Weight:      Height:       SpO2: 90 % O2 Flow Rate (L/min): 6 L/min   Intake/Output Summary (Last 24 hours) at 04/26/2019 0720 Last data filed at 04/26/2019 0600 Gross per 24 hour  Intake 2015.54 ml  Output 50 ml  Net 1965.54 ml    Filed Weights   04/20/2019 2200  Weight: 102.1 kg    Exam: General exam: In no acute distress. Respiratory system: Good air movement and crackles bilaterally mainly on the basis. Cardiovascular system: S1 & S2 heard, RRR. No JVD. Gastrointestinal system: Abdomen is nondistended, soft and nontender.  Central nervous system: Alert and oriented. No focal neurological deficits. Extremities: No pedal edema. Skin: No rashes, lesions or ulcers Psychiatry: Judgement and insight appear normal. Mood & affect appropriate.   Data Reviewed:    Labs: Basic Metabolic Panel: Recent Labs  Lab 04/25/19 0030 04/26/19 0043  NA 134* 140  K 4.7 5.8*  CL 104 109  CO2 22 13*  GLUCOSE 123* 172*  BUN 22 18  CREATININE 1.01* 0.80  CALCIUM 8.2* 9.1   GFR Estimated Creatinine Clearance: 69.7 mL/min (by C-G formula based on SCr of 0.8 mg/dL). Liver Function Tests: Recent Labs  Lab 04/25/19 0030 04/26/19 0043  AST 24 33  ALT 8 10  ALKPHOS 96 114  BILITOT 0.3 0.9  PROT 6.2* 6.8  ALBUMIN 2.1* 2.3*   No results for input(s): LIPASE, AMYLASE in the last 168 hours. No results for input(s): AMMONIA in the last 168 hours. Coagulation profile No results for input(s): INR, PROTIME in the last 168 hours. COVID-19 Labs  Recent Labs    04/25/19 0030 04/25/19 0225 04/25/19 0615 04/26/19 0043 04/26/19 0410  DDIMER 1.02*  --   --   --  1.08*  FERRITIN  --  219  --  276  --   LDH 295*  --  309*  --   --   CRP  --  17.4*  --  19.0*  --     Lab Results  Component Value Date   SARSCOV2NAA POSITIVE (A) 04/25/2019    CBC: Recent Labs  Lab 04/25/19 0030  WBC 8.4  NEUTROABS 5.5  HGB 11.9*  HCT 38.4  MCV 100.3*  PLT 318   Cardiac Enzymes: No results for input(s): CKTOTAL, CKMB, CKMBINDEX, TROPONINI in the last 168 hours. BNP (last 3 results) No results for input(s): PROBNP in the last 8760 hours. CBG: No results for input(s): GLUCAP in the last 168 hours. D-Dimer: Recent Labs     04/25/19 0030 04/26/19 0410  DDIMER 1.02* 1.08*   Hgb A1c: No results for input(s): HGBA1C in the last 72 hours. Lipid Profile: Recent Labs    04/25/19 0030  TRIG 104   Thyroid function studies: No results for input(s): TSH, T4TOTAL, T3FREE, THYROIDAB in the last 72 hours.  Invalid input(s): FREET3 Anemia work up: Recent Labs    04/25/19 0225 04/26/19 0043  FERRITIN 219 276   Sepsis Labs: Recent Labs  Lab 04/25/19 0030 04/26/19 0043  PROCALCITON 1.86 0.50  WBC 8.4  --   LATICACIDVEN 1.5  --    Microbiology Recent Results (from the past 240 hour(s))  SARS CORONAVIRUS 2 (TAT 6-24 HRS) Nasopharyngeal Nasopharyngeal Swab     Status: Abnormal   Collection Time: 04/25/19  2:25 AM   Specimen: Nasopharyngeal Swab  Result Value Ref Range Status   SARS Coronavirus 2 POSITIVE (A) NEGATIVE Final    Comment: RESULT CALLED TO, READ BACK BY AND VERIFIED WITH: CALLED 3X NO ANSWER [facility] [phone], ALSO NO ANSWER THROUGH OPERATOR. (NOTE) SARS-CoV-2 target nucleic acids are DETECTED. The SARS-CoV-2 RNA is generally detectable in upper and lower respiratory specimens during the acute phase of infection. Positive results are indicative of active infection with SARS-CoV-2. Clinical  correlation with patient history and other diagnostic information is necessary to determine patient infection status. Positive results do  not rule out bacterial infection or co-infection with other viruses. The expected result is Negative. Fact Sheet for Patients: SugarRoll.be Fact Sheet for Healthcare Providers: https://www.woods-mathews.com/ This test is not yet approved or cleared by the [place] FDA and  has been authorized for detection and/or diagnosis of SARS-CoV-2 by FDA under an Emergency Use Authorization (EUA). This EUA will remain  in effect (mea ning this test can be used) for the duration of the COVID-19 declaration under Section 564(b)(1) of  the Act, 21 U.S.C. section 360bbb-3(b)(1), unless the authorization is terminated or revoked sooner. Performed at [facility], [place] [address], [place], [place] [address]      Medications:   . aspirin EC  325 mg Oral Daily  . atorvastatin  40 mg Oral q1800  . cholecalciferol  1,000 Units Oral Daily  . dexamethasone (DECADRON) injection  10 mg Intravenous Q12H  . enoxaparin (LOVENOX) injection  0.5 mg/kg Subcutaneous Q24H  . gabapentin  600 mg Oral TID WC & HS  . Ipratropium-Albuterol  1 puff Inhalation Q6H  . mouth rinse  15 mL Mouth Rinse BID  . metoprolol succinate  100 mg Oral Daily  . metoprolol succinate  50 mg Oral Daily  . pantoprazole  40 mg Oral Daily  . [START ON 04/27/2019] pneumococcal 23 valent vaccine  0.5 mL Intramuscular Tomorrow-1000  . pyridOXINE  50 mg Oral Daily  . sertraline  50 mg Oral Daily  . vitamin C  500 mg Oral Daily  . zinc sulfate  220 mg Oral Daily   Continuous Infusions: . ceFEPime (MAXIPIME) IV 2 g (04/26/19 0534)  . remdesivir 100 mg in NS 250 mL    . vancomycin        LOS: 1 day   Charlynne Cousins  [facility]  04/26/2019, 7:20 AM

## 2019-04-27 ENCOUNTER — Inpatient Hospital Stay: Payer: Self-pay

## 2019-04-27 LAB — GLUCOSE, CAPILLARY: Glucose-Capillary: 224 mg/dL — ABNORMAL HIGH (ref 70–99)

## 2019-04-27 MED ORDER — LORAZEPAM 2 MG/ML IJ SOLN
1.0000 mg | Freq: Once | INTRAMUSCULAR | Status: AC
  Administered 2019-04-27: 06:00:00 1 mg via INTRAVENOUS
  Filled 2019-04-27: qty 1

## 2019-04-27 MED ORDER — TOCILIZUMAB 400 MG/20ML IV SOLN
800.0000 mg | Freq: Once | INTRAVENOUS | Status: AC
  Administered 2019-04-27: 800 mg via INTRAVENOUS
  Filled 2019-04-27: qty 40

## 2019-04-27 MED ORDER — SODIUM CHLORIDE 0.9 % IV SOLN
2.0000 g | Freq: Three times a day (TID) | INTRAVENOUS | Status: DC
  Administered 2019-04-27 – 2019-04-28 (×3): 2 g via INTRAVENOUS
  Filled 2019-04-27 (×3): qty 2

## 2019-04-27 NOTE — Progress Notes (Signed)
PT Cancellation Note  Patient Details Name: [name] LEEDOM MRN: [id number] DOB: [birth year]   Cancelled Treatment:    Reason Eval/Treat Not Completed: Medical issues which prohibited therapy;Patient not medically ready, desatting, on NRB mask. Will check back tomorrow.   Claretha Cooper 04/27/2019, 11:00 AM  [place] Office [phone]

## 2019-04-27 NOTE — Progress Notes (Addendum)
[facility] PROGRESS NOTE    Progress Note  [name]  [id number] DOB: Nov 15, 1940 DOA: 04/27/2019 PCP: Virgel Bouquet, MD     Brief Narrative:   [name] is an 78 y.o. female past medical history significant for hypertension TIA anxiety depression presents to the hospital from [facility] via EMS for hypoxia she tested positive for COVID-19 on 04/10/2019 staff at the nursing home check her saturations and it was low she was placed on 5 L of oxygen.  In the ED she was found to be hypoxic.,  Inflammatory markers were elevated chest x-ray shows multifocal airspace disease most pronounced at the right and lung base high suspicion for Covid-19.  Assessment/Plan:   Acute respiratory failure with hypoxia due to COVID19 PNA: Her oxygen requirements have worsened, this morning she is needing 15 L with a nonrebreather to keep saturations greater than 95%. [name] currently on empiric antibiotics due to to her fever and her procalcitonin on admission.  Will have low threshold to discontinued. We will continue IV remdesivir, steroids vitamin C and zinc. Culture data is pending. Inflammatory markers are pending this morning continue to trend. She has refused blood draw this morning we had a long conversation and she has agreed for the lab draws. She has no history of TB or hepatitis. The treatment plan and use of medications (Actemra) and known side effects were discussed with patient/family, they were clearly explained that there is no proven definitive treatment for COVID-19 infection, any medications used here are based on published clinical articles/anecdotal data which are not peer-reviewed or randomized control trials.  Complete risks and long-term side effects are unknown, however in the best clinical judgment they seem to be of some clinical benefit rather than medical risks.  Patient/family agree with the treatment plan and want to receive the given medications. I spent  over 45 minutes explaining to the patient the side effects of the medication the need for labs and other interventions, she relates that she is just tired of being stuck but she has agreed to allowed the staff to draw blood work and has allowed the PICC line to be placed.  AKI: Baseline creatinine of less than 1 on admission 1.2, in the setting of ARB and decreased oral intake. We have KVO her IV fluids. She has refused basic metabolic panel this morning after long conversation she decided and agreed for lab draws.  Mild Hyperkalemia: In the setting of ARB and hypovolemia she was started on IV fluids and given oral Kayexalate basic metabolic panel is pending this morning. She refused lab draw at this morning,, after having a conversation with me she has agreed to have lab draws. We will place a PICC line. Basic metabolic panel is pending.  Essential hypertension: Blood pressure fairly controlled continue metoprolol.  Hyperlipidemia: Continue Lipitor.  Depression Continue home Zoloft.    DVT prophylaxis: lovenox Family Communication:none Disposition Plan/Barrier to D/C: once she finishes her remdevir and is off oxygen. Code Status:     Code Status Orders  (From admission, onward)         Start     Ordered   04/25/19 0432  Full code  Continuous     04/25/19 0433        Code Status History    Date Active Date Inactive Code Status Order ID Comments User Context   12/19/2015 1617 12/21/2015 2133 Full Code [id number]  Kristeen Miss, MD Inpatient   07/11/2015 [phone] 07/14/2015 1833 Full Code  [id number]  Rondel Jumbo, PA-C ED   07/11/2015 0958 07/11/2015 0958 Full Code [id number]  Rondel Jumbo, PA-C ED   07/01/2015 2317 07/04/2015 1856 Full Code [id number]  Ivor Costa, MD ED   06/16/2015 1638 06/29/2015 0855 Full Code [id number]  Delfin Gant, NP Inpatient   06/15/2015 1411 06/16/2015 1638 Full Code [id number]  Virgel Manifold, MD ED   04/19/2015 1831 04/21/2015 1844 Full Code [id number]   Kristeen Miss, MD Inpatient   09/14/2014 1801 09/20/2014 1827 Full Code [id number]  Wylene Simmer, MD Inpatient   08/07/2014 0649 08/10/2014 1733 Full Code [id number]  Larene Pickett, PA-C ED   10/12/2011 0030 10/14/2011 1616 Full Code [id number]  Joycelyn Man, RN Inpatient   Advance Care Planning Activity    Advance Directive Documentation     Most Recent Value  Type of Advance Directive  Healthcare Power of Attorney, Living will  Pre-existing out of facility DNR order (yellow form or pink MOST form)  -  "MOST" Form in Place?  -        IV Access:    Peripheral IV   Procedures and diagnostic studies:   Dg Chest Port 1 View  Result Date: 04/26/2019 CLINICAL DATA:  COVID-19 EXAM: PORTABLE CHEST 1 VIEW COMPARISON:  Two days ago FINDINGS: Low volume chest with asymmetric right diaphragmatic elevation. Interstitial coarsening and patchy airspace opacity. No visible effusion or pneumothorax. Cardiomegaly. IMPRESSION: Stable low volume chest with bilateral pneumonia. There is cardiomegaly and diffuse interstitial opacity, question developing edema. Electronically Signed   By: Monte Fantasia M.D.   On: 04/26/2019 06:40     Medical Consultants:    None.  Anti-Infectives:   IV remdesivir  Subjective:    [name] relates she feels tired and fatigue and is uncomfortable she relates her shortness of breath is about the same as yesterday.  Objective:    Vitals:   04/26/19 1700 04/26/19 1943 04/26/19 2329 04/27/19 0359  BP:  (!) 164/78 (!) 160/66 138/83  Pulse: (!) 107 (!) 111 (!) 109 100  Resp: 17 (!) 24 (!) 22 17  Temp:  98.6 F (37 C) 98.4 F (36.9 C) 99 F (37.2 C)  TempSrc:  Oral Oral Oral  SpO2: 91% (!) 89% 92% 97%  Weight:      Height:       SpO2: 97 % O2 Flow Rate (L/min): 15 L/min   Intake/Output Summary (Last 24 hours) at 04/27/2019 0752 Last data filed at 04/27/2019 0600 Gross per 24 hour  Intake 2653.04 ml  Output 850 ml  Net 1803.04 ml    Filed Weights   04/07/2019 2200  Weight: 102.1 kg    Exam: General exam: In no acute distress. Respiratory system: Good air movement and diffuse crackles bilaterally. Cardiovascular system: S1 & S2 heard, RRR. No JVD. Gastrointestinal system: Abdomen is nondistended, soft and nontender.  Central nervous system: Alert and oriented. No focal neurological deficits. Extremities: No pedal edema. Skin: No rashes, lesions or ulcers Psychiatry: Judgement and insight appear normal. Mood & affect appropriate.    Data Reviewed:    Labs: Basic Metabolic Panel: Recent Labs  Lab 04/25/19 0030 04/26/19 0043  NA 134* 140  K 4.7 5.8*  CL 104 109  CO2 22 13*  GLUCOSE 123* 172*  BUN 22 18  CREATININE 1.01* 0.80  CALCIUM 8.2* 9.1   GFR Estimated Creatinine Clearance: 69.7 mL/min (by C-G formula based on SCr of 0.8 mg/dL). Liver Function Tests: Recent Labs  Lab  04/25/19 0030 04/26/19 0043  AST 24 33  ALT 8 10  ALKPHOS 96 114  BILITOT 0.3 0.9  PROT 6.2* 6.8  ALBUMIN 2.1* 2.3*   No results for input(s): LIPASE, AMYLASE in the last 168 hours. No results for input(s): AMMONIA in the last 168 hours. Coagulation profile No results for input(s): INR, PROTIME in the last 168 hours. COVID-19 Labs  Recent Labs    04/25/19 0030 04/25/19 0225 04/25/19 0615 04/26/19 0043 04/26/19 0410  DDIMER 1.02*  --   --   --  1.08*  FERRITIN  --  219  --  276  --   LDH 295*  --  309*  --   --   CRP  --  17.4*  --  19.0*  --     Lab Results  Component Value Date   SARSCOV2NAA POSITIVE (A) 04/25/2019    CBC: Recent Labs  Lab 04/25/19 0030  WBC 8.4  NEUTROABS 5.5  HGB 11.9*  HCT 38.4  MCV 100.3*  PLT 318   Cardiac Enzymes: No results for input(s): CKTOTAL, CKMB, CKMBINDEX, TROPONINI in the last 168 hours. BNP (last 3 results) No results for input(s): PROBNP in the last 8760 hours. CBG: No results for input(s): GLUCAP in the last 168 hours. D-Dimer: Recent Labs    04/25/19  0030 04/26/19 0410  DDIMER 1.02* 1.08*   Hgb A1c: No results for input(s): HGBA1C in the last 72 hours. Lipid Profile: Recent Labs    04/25/19 0030  TRIG 104   Thyroid function studies: No results for input(s): TSH, T4TOTAL, T3FREE, THYROIDAB in the last 72 hours.  Invalid input(s): FREET3 Anemia work up: Recent Labs    04/25/19 0225 04/26/19 0043  FERRITIN 219 276   Sepsis Labs: Recent Labs  Lab 04/25/19 0030 04/26/19 0043  PROCALCITON 1.86 0.50  WBC 8.4  --   LATICACIDVEN 1.5  --    Microbiology Recent Results (from the past 240 hour(s))  Blood Culture (routine x 2)     Status: None (Preliminary result)   Collection Time: 04/25/19 12:30 AM   Specimen: BLOOD LEFT HAND  Result Value Ref Range Status   Specimen Description   Final    BLOOD LEFT HAND Performed at [facility] [address], [place], [place] [address]    Special Requests   Final    BOTTLES DRAWN AEROBIC ONLY Blood Culture adequate volume Performed at [facility] [address] [place], [place] [address]    Culture   Final    NO GROWTH 1 DAY Performed at [facility], [place] [address], [place], [place] [address]    Report Status PENDING  Incomplete  Blood Culture (routine x 2)     Status: None (Preliminary result)   Collection Time: 04/25/19  1:30 AM   Specimen: BLOOD  Result Value Ref Range Status   Specimen Description   Final    BLOOD RIGHT ANTECUBITAL Performed at [facility] [address] [place], [place] [address]    Special Requests   Final    BOTTLES DRAWN AEROBIC AND ANAEROBIC Blood Culture adequate volume Performed at [facility] [address], [place], [place] [address]    Culture   Final    NO GROWTH 1 DAY Performed at [facility], [place] [address] [place], [place] [address]    Report Status PENDING  Incomplete  SARS CORONAVIRUS 2 (TAT 6-24 HRS) Nasopharyngeal Nasopharyngeal  Swab  Status: Abnormal   Collection Time: 04/25/19  2:25 AM   Specimen: Nasopharyngeal Swab  Result Value Ref Range Status   SARS Coronavirus 2 POSITIVE (A) NEGATIVE Final    Comment: RESULT CALLED TO, READ BACK BY AND VERIFIED WITH: CALLED 3X NO ANSWER [facility] ED [phone], ALSO NO ANSWER THROUGH OPERATOR. (NOTE) SARS-CoV-2 target nucleic acids are DETECTED. The SARS-CoV-2 RNA is generally detectable in upper and lower respiratory specimens during the acute phase of infection. Positive results are indicative of active infection with SARS-CoV-2. Clinical  correlation with patient history and other diagnostic information is necessary to determine patient infection status. Positive results do  not rule out bacterial infection or co-infection with other viruses. The expected result is Negative. Fact Sheet for Patients: SugarRoll.be Fact Sheet for Healthcare Providers: https://www.woods-mathews.com/ This test is not yet approved or cleared by the [place] FDA and  has been authorized for detection and/or diagnosis of SARS-CoV-2 by FDA under an Emergency Use Authorization (EUA). This EUA will remain  in effect (mea ning this test can be used) for the duration of the COVID-19 declaration under Section 564(b)(1) of the Act, 21 U.S.C. section 360bbb-3(b)(1), unless the authorization is terminated or revoked sooner. Performed at [facility], [place] [address], [place], [place] [address]      Medications:   . aspirin EC  325 mg Oral Daily  . atorvastatin  40 mg Oral q1800  . cholecalciferol  1,000 Units Oral Daily  . dexamethasone (DECADRON) injection  6 mg Intravenous Q12H  . enoxaparin (LOVENOX) injection  0.5 mg/kg Subcutaneous Q24H  . gabapentin  600 mg Oral TID WC & HS  . Ipratropium-Albuterol  1 puff Inhalation Q6H  . mouth rinse  15 mL Mouth Rinse BID  . metoprolol succinate  100 mg Oral Daily  . metoprolol succinate  50 mg Oral  Daily  . pantoprazole  40 mg Oral Daily  . pneumococcal 23 valent vaccine  0.5 mL Intramuscular Tomorrow-1000  . pyridOXINE  50 mg Oral Daily  . sertraline  50 mg Oral Daily  . vitamin C  500 mg Oral Daily  . zinc sulfate  220 mg Oral Daily   Continuous Infusions: . sodium chloride 75 mL/hr at 04/27/19 0540  . ceFEPime (MAXIPIME) IV 2 g (04/27/19 0539)  . remdesivir 100 mg in NS 250 mL Stopped (04/26/19 1040)  . vancomycin Stopped (04/26/19 0916)      LOS: 2 days   Charlynne Cousins  [facility]  04/27/2019, 7:52 AM

## 2019-04-27 NOTE — Progress Notes (Signed)
Patient pulling off NRB, sats dropped to 30's, immediately responded and placed on 15L HFNC with NRB, sats recovered to 92%. Patient tolerated well.  MD notified.

## 2019-04-27 NOTE — Progress Notes (Signed)
Pt transitioned to 11L HFNC and O2 sats are 89-92% while pt is sleeping.  Informed by phlebotomy that they have tried twice to attempt blood draw but were unsuccessful. MD notified.

## 2019-04-27 NOTE — Progress Notes (Signed)
Placed on 100 NRB with desat of 80% on 15  [place]. Des

## 2019-04-27 NOTE — Progress Notes (Signed)
Spoke with Lilia Pro from lab, she and others have attempted 3x to draw labs and were unsuccessful, per lab policy if they are unsuccessful 3x times, they d/c the lab order. Lilia Pro stated the evening nurse was going to update the Dr.

## 2019-04-27 NOTE — TOC Initial Note (Signed)
Transition of Care [facility]) - Initial/Assessment Note    Patient Details  Name: [name] MRN: [id number] Date of Birth: [birth year]  [facility] CM/SW Contact:    Weston Anna, LCSW Phone Number: 04/27/2019, 10:54 AM  Clinical Narrative:                  Patient is a long-term resident at [facility]. Facility is able to accept patient once medically stable. CSW will continue to follow.   Expected Discharge Plan: Skilled Nursing Facility Barriers to Discharge: Continued Medical Work up   Patient Goals and CMS Choice        Expected Discharge Plan and Services Expected Discharge Plan: [place] arrangements for the past 2 months: [place]                 DME Arranged: N/A         HH Arranged: NA          Prior Living Arrangements/Services Living arrangements for the past 2 months: [place] Lives with:: Facility Resident   Do you feel safe going back to the place where you live?: Yes               Activities of Daily Living Home Assistive Devices/Equipment: Bedside commode/3-in-1, Eyeglasses, Walker (specify type) ADL Screening (condition at time of admission) Patient's cognitive ability adequate to safely complete daily activities?: Yes Is the patient deaf or have difficulty hearing?: No Does the patient have difficulty seeing, even when wearing glasses/contacts?: No Does the patient have difficulty concentrating, remembering, or making decisions?: No Patient able to express need for assistance with ADLs?: Yes Does the patient have difficulty dressing or bathing?: Yes Independently performs ADLs?: No Communication: Independent Dressing (OT): Needs assistance Is this a change from baseline?: Pre-admission baseline Grooming: Needs assistance Is this a change from baseline?: Pre-admission baseline Feeding: Independent Bathing: Needs assistance Is this a change from  baseline?: Pre-admission baseline Toileting: Independent with device (comment)(BSC) In/Out Bed: Independent Walks in Home: Independent Does the patient have difficulty walking or climbing stairs?: Yes Weakness of Legs: Both Weakness of Arms/Hands: None  Permission Sought/Granted                  Emotional Assessment           Psych Involvement: No (comment)  Admission diagnosis:  Hypoxemia [R09.02] SOB (shortness of breath) [R06.02] COVID-19 [U07.1] Pneumonia due to COVID-19 virus [U07.1, J12.89] Patient Active Problem List   Diagnosis Date Noted  . Multifocal pneumonia 04/25/2019  . Pneumonia due to COVID-19 virus 04/25/2019  . Edema 11/06/2017  . Uncomplicated opioid dependence ([place]) 07/20/2016  . Rotator cuff tear arthropathy of right shoulder 07/20/2016  . Nontraumatic incomplete tear of right rotator cuff 07/17/2016  . Acute pain of right shoulder 06/27/2016  . Impingement syndrome of right shoulder 06/11/2016  . Colon cancer screening 05/17/2016  . Heme positive stool 05/17/2016  . Esophageal ulcer with bleeding 05/17/2016  . Duodenal ulcer 05/17/2016  . Herniated nucleus pulposus, L2-3 right 12/19/2015  . Right foot pain 07/22/2015  . Chest pain, atypical 07/11/2015  . History of drug overdose 07/07/2015  . Frequent falls 07/07/2015  . Hyperlipidemia 07/07/2015  . Essential hypertension 07/07/2015  . Spinal stenosis, lumbar region, with neurogenic claudication 07/07/2015  . Severe episode of recurrent major depressive disorder, with psychotic features ([place]) 07/07/2015  . Visual hallucination 07/01/2015  .  Severe episode of recurrent major depressive disorder, without psychotic features ([place])   . Back pain 06/20/2015  . MDD (major depressive disorder), recurrent episode, severe ([place]) 06/16/2015  . Lumbar stenosis 04/19/2015  . Hypokalemia 09/19/2014  . Constipation 09/19/2014  . Acute respiratory failure with hypoxia ([place]) 09/17/2014  . OSA  (obstructive sleep apnea) 09/17/2014  . Ankle fracture, lateral malleolus, closed 09/14/2014  . Major depressive disorder, recurrent severe without psychotic features ([place]) 08/07/2014  . Insomnia 08/05/2014  . Hip joint painful on movement 08/05/2014  . Degenerative disc disease, lumbar 08/05/2014  . Severe obesity (BMI >= 40) ([place]) 01/11/2014  . Depression   . Arthritis   . Polyneuropathy in other diseases classified elsewhere ([place]) 04/08/2013  . Restless legs syndrome (RLS) 04/08/2013  . UTI (lower urinary tract infection) 10/14/2011  . Dizziness 10/12/2011  . TIA (transient ischemic attack) 10/12/2011  . GERD (gastroesophageal reflux disease) 10/12/2011   PCP:  Virgel Bouquet, MD Pharmacy:   [id number] - [place], [place] [address] Phone: [phone] Fax: [facility] - [place], [place] [address] Phone: [phone] Fax: [phone]  [facility] [address] [place] AT [facility] [address] [place] [address] Phone: [phone] Fax: [facility], [place] - [address] [place] [address] Phone: [phone] Fax: [phone]     Social Determinants of Health (SDOH) Interventions    Readmission Risk Interventions No flowsheet data found.

## 2019-04-27 NOTE — Progress Notes (Signed)
3 lab techs unable to obtain blood specimen, new tech arrives tomorrow to attempt sample.

## 2019-04-28 LAB — GLUCOSE, CAPILLARY: Glucose-Capillary: 210 mg/dL — ABNORMAL HIGH (ref 70–99)

## 2019-04-28 LAB — CBC WITH DIFFERENTIAL/PLATELET
Abs Immature Granulocytes: 0.16 10*3/uL — ABNORMAL HIGH (ref 0.00–0.07)
Basophils Absolute: 0 10*3/uL (ref 0.0–0.1)
Basophils Relative: 0 %
Eosinophils Absolute: 0 10*3/uL (ref 0.0–0.5)
Eosinophils Relative: 0 %
HCT: 41.5 % (ref 36.0–46.0)
Hemoglobin: 13 g/dL (ref 12.0–15.0)
Immature Granulocytes: 2 %
Lymphocytes Relative: 8 %
Lymphs Abs: 0.8 10*3/uL (ref 0.7–4.0)
MCH: 30.3 pg (ref 26.0–34.0)
MCHC: 31.3 g/dL (ref 30.0–36.0)
MCV: 96.7 fL (ref 80.0–100.0)
Monocytes Absolute: 0.7 10*3/uL (ref 0.1–1.0)
Monocytes Relative: 8 %
Neutro Abs: 7.5 10*3/uL (ref 1.7–7.7)
Neutrophils Relative %: 82 %
Platelets: 467 10*3/uL — ABNORMAL HIGH (ref 150–400)
RBC: 4.29 MIL/uL (ref 3.87–5.11)
RDW: 15.4 % (ref 11.5–15.5)
WBC: 9.2 10*3/uL (ref 4.0–10.5)
nRBC: 0.2 % (ref 0.0–0.2)

## 2019-04-28 LAB — COMPREHENSIVE METABOLIC PANEL
ALT: 11 U/L (ref 0–44)
AST: 34 U/L (ref 15–41)
Albumin: 2.7 g/dL — ABNORMAL LOW (ref 3.5–5.0)
Alkaline Phosphatase: 101 U/L (ref 38–126)
Anion gap: 12 (ref 5–15)
BUN: 16 mg/dL (ref 8–23)
CO2: 22 mmol/L (ref 22–32)
Calcium: 9.1 mg/dL (ref 8.9–10.3)
Chloride: 103 mmol/L (ref 98–111)
Creatinine, Ser: 0.57 mg/dL (ref 0.44–1.00)
GFR calc Af Amer: >60 mL/min (ref >60)
GFR calc non Af Amer: >60 mL/min (ref >60)
Glucose, Bld: 237 mg/dL — ABNORMAL HIGH (ref 70–99)
Potassium: 3.7 mmol/L (ref 3.5–5.1)
Sodium: 137 mmol/L (ref 135–145)
Total Bilirubin: 0.9 mg/dL (ref 0.3–1.2)
Total Protein: 7.2 g/dL (ref 6.5–8.1)

## 2019-04-28 LAB — POCT I-STAT 7, (LYTES, BLD GAS, ICA,H+H)
Acid-Base Excess: 1 mmol/L (ref 0.0–2.0)
Bicarbonate: 25.8 mmol/L (ref 20.0–28.0)
Calcium, Ion: 1.36 mmol/L (ref 1.15–1.40)
HCT: 42 % (ref 36.0–46.0)
Hemoglobin: 14.3 g/dL (ref 12.0–15.0)
O2 Saturation: 78 %
Patient temperature: 98.6
Potassium: 3.6 mmol/L (ref 3.5–5.1)
Sodium: 138 mmol/L (ref 135–145)
TCO2: 27 mmol/L (ref 22–32)
pCO2 arterial: 41.8 mmHg (ref 32.0–48.0)
pH, Arterial: 7.398 (ref 7.350–7.450)
pO2, Arterial: 43 mmHg — ABNORMAL LOW (ref 83.0–108.0)

## 2019-04-28 LAB — D-DIMER, QUANTITATIVE: D-Dimer, Quant: 0.94 ug/mL-FEU — ABNORMAL HIGH (ref 0.00–0.50)

## 2019-04-28 LAB — C-REACTIVE PROTEIN: CRP: 4.5 mg/dL — ABNORMAL HIGH (ref <1.0)

## 2019-04-28 MED ORDER — MORPHINE SULFATE (PF) 4 MG/ML IV SOLN
INTRAVENOUS | Status: AC
  Administered 2019-04-28: 16:00:00
  Filled 2019-04-28: qty 1

## 2019-04-28 MED ORDER — TRAZODONE HCL 50 MG PO TABS
50.0000 mg | ORAL_TABLET | Freq: Every day | ORAL | Status: DC
  Administered 2019-04-28 – 2019-05-25 (×28): 50 mg via ORAL
  Filled 2019-04-28 (×29): qty 1

## 2019-04-28 MED ORDER — LORAZEPAM 2 MG/ML IJ SOLN
1.0000 mg | Freq: Once | INTRAMUSCULAR | Status: AC
  Administered 2019-04-28: 1 mg via INTRAVENOUS
  Filled 2019-04-28: qty 1

## 2019-04-28 MED ORDER — METOPROLOL TARTRATE 5 MG/5ML IV SOLN
10.0000 mg | Freq: Three times a day (TID) | INTRAVENOUS | Status: DC | PRN
  Administered 2019-04-28: 10 mg via INTRAVENOUS
  Filled 2019-04-28 (×2): qty 10

## 2019-04-28 MED ORDER — HALOPERIDOL LACTATE 5 MG/ML IJ SOLN
1.0000 mg | Freq: Four times a day (QID) | INTRAMUSCULAR | Status: DC | PRN
  Administered 2019-04-28: 1 mg via INTRAVENOUS
  Filled 2019-04-28: qty 1

## 2019-04-28 MED ORDER — LORAZEPAM 2 MG/ML IJ SOLN
1.0000 mg | INTRAMUSCULAR | Status: DC | PRN
  Administered 2019-04-28: 1 mg via INTRAVENOUS
  Filled 2019-04-28: qty 1

## 2019-04-28 MED ORDER — AMLODIPINE BESYLATE 10 MG PO TABS
10.0000 mg | ORAL_TABLET | Freq: Every day | ORAL | Status: DC
  Administered 2019-04-28 – 2019-05-13 (×16): 10 mg via ORAL
  Filled 2019-04-28 (×16): qty 1

## 2019-04-28 MED ORDER — HALOPERIDOL LACTATE 5 MG/ML IJ SOLN
5.0000 mg | Freq: Four times a day (QID) | INTRAMUSCULAR | Status: DC | PRN
  Administered 2019-05-05: 21:00:00 5 mg via INTRAVENOUS
  Filled 2019-04-28: qty 1

## 2019-04-28 MED ORDER — SODIUM CHLORIDE 0.9% FLUSH
10.0000 mL | Freq: Two times a day (BID) | INTRAVENOUS | Status: DC
  Administered 2019-04-28 – 2019-05-09 (×22): 10 mL

## 2019-04-28 MED ORDER — PANTOPRAZOLE SODIUM 20 MG PO TBEC: 20.0000 mg | DELAYED_RELEASE_TABLET | Freq: Every day | ORAL | Status: DC

## 2019-04-28 MED ORDER — MORPHINE SULFATE (PF) 4 MG/ML IV SOLN
4.0000 mg | INTRAVENOUS | Status: DC | PRN
  Administered 2019-04-28: 6 mg via INTRAVENOUS
  Administered 2019-04-29 – 2019-05-06 (×6): 4 mg via INTRAVENOUS
  Filled 2019-04-28 (×3): qty 1
  Filled 2019-04-28: qty 2
  Filled 2019-04-28 (×3): qty 1

## 2019-04-28 MED ORDER — CHLORHEXIDINE GLUCONATE CLOTH 2 % EX PADS
6.0000 | MEDICATED_PAD | Freq: Every day | CUTANEOUS | Status: DC
  Administered 2019-04-28 – 2019-05-28 (×31): 6 via TOPICAL

## 2019-04-28 MED ORDER — LABETALOL HCL 5 MG/ML IV SOLN: 5.0000 mg | INTRAVENOUS | Status: DC | PRN

## 2019-04-28 MED ORDER — MORPHINE SULFATE (PF) 4 MG/ML IV SOLN: 4.0000 mg | INTRAVENOUS | Status: DC | PRN

## 2019-04-28 MED ORDER — SODIUM CHLORIDE 0.9% FLUSH
10.0000 mL | INTRAVENOUS | Status: DC | PRN
  Administered 2019-04-30 (×2): 10 mL
  Filled 2019-04-28 (×2): qty 40

## 2019-04-28 NOTE — Progress Notes (Signed)
While sitting with pt, pt became agitated would not leave o2 on medicated pt with 1mg  haldol. Pt could not return to baseline, sats remaninig in the 70's called rapid response, rapid nurse to bedside, paged dr.feliz, md to bedside. Orders placed, pt medicated for pain and agitation, blood gas drawn, pt 02 now at baseline will continue to monitor

## 2019-04-28 NOTE — Progress Notes (Addendum)
[facility] PROGRESS NOTE    Progress Note  [name]  [id number] DOB: Sep 22, 1940 DOA: 05/01/2019 PCP: Virgel Bouquet, MD     Brief Narrative:   [name] is an 78 y.o. female past medical history significant for hypertension TIA anxiety depression presents to the hospital from [facility] via EMS for hypoxia she tested positive for COVID-19 on 04/10/2019 staff at the nursing home check her saturations and it was low she was placed on 5 L of oxygen.  In the ED she was found to be hypoxic.,  Inflammatory markers were elevated chest x-ray shows multifocal airspace disease most pronounced at the right and lung base high suspicion for Covid-19.  Assessment/Plan:   Acute respiratory failure with hypoxia due to COVID19 PNA: Her oxygen requirements trended up yesterday she went up to 15 L of high flow nasal cannula to keep saturations greater than 96%. Inflammatory markers were significantly elevated on admission she has refused lab draws. She was started on IV remdesivir, dexamethasone vitamin C and zinc. Empiric antibiotics were discontinued as her procalcitonin was low yield, she had no leukocytosis, culture data has remained negative. A repeat procalcitonin was 0.5. Had a long conversation on 04/27/2019 with the patient about risk and benefits of Actemra and she relates she would like to receive it. Spent over 30 minutes discussing the need risk and benefits of Actemra. The treatment plan and use of medications and known side effects were discussed with patient/family, they were clearly explained that there is no proven definitive treatment for COVID-19 infection, any medications used here are based on published clinical articles/anecdotal data which are not peer-reviewed or randomized control trials.  Complete risks and long-term side effects are unknown, however in the best clinical judgment they seem to be of some clinical benefit rather than medical risks.   Patient/family agree with the treatment plan and want to receive the given medications.  AKI: With a baseline creatinine of less than 1 on admission 1.2.  In the setting of decreased oral intake and ARB, these were held on admission. She was started on IV fluids, but the patient has refused lab draws the a.m., her last creatinine at the hospital her creatinine was 0.8. A PICC line was ordered and is likely to be inserted on 04/28/2019. Keep fluids to [facility].  Mild Hyperkalemia: Mild hyperkalemia in the setting of ARB and hypovolemia. Her ARB was held she was started on IV fluids was given oral Kayexalate her basic metabolic panel is pending this morning. Has refused all her labs from yesterday.  New Acute confusional state: Likely due to inflammatory reaction and hypoxia. Every time the patient removed her oxygen she gets delirious and more confused was just spirals out of control. Try to keep the oxygen on the patient. Placed n.p.o. as she is sleepy this morning. We will try to avoid benzodiazepines, as this will worsen her delirium/acute confusional state. Try to keep the windows open to let the sunlight and close them at night.  And try to reorient the patient as much as needed. Use Haldol IV as needed.  Essential hypertension: Blood pressure fairly controlled continue metoprolol.  Hyperlipidemia: Continue Lipitor.  Depression/anxiety: The patient has been fighting and insulting the nurses, I do not know to what degree his acute confusional state versus anxiety.  I would like to avoid benzodiazepines as this can make her acute confusional state worst. Continue home Zoloft.  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 She was becoming agitated, de-sating and the oral narcotics not helping. She was given morphine Iv with significant improvement in her back  pain. She calm down, saturations improved and she was able to have a conversation and express her pain. Her saturation improved to greater than >95% on 15L non-rebreathe and supplemental nasal canula.  History of narcotics abuse: Will be cautious  With narcotics due to her history of abuse, avoid excessive narcotics.    DVT prophylaxis: lovenox Family Communication:none Disposition Plan/Barrier to D/C: once she finishes her remdevir and is off oxygen. Code Status:     Code Status Orders  (From admission, onward)         Start     Ordered   04/25/19 0432  Full code  Continuous     04/25/19 0433        Code Status History    Date Active Date Inactive Code Status Order ID Comments User Context   12/19/2015 1617 12/21/2015 2133 Full Code [id number]  Kristeen Miss, MD Inpatient   07/11/2015 0958 07/14/2015 1833 Full Code [id number]  Rondel Jumbo, PA-C ED   07/11/2015 0958 07/11/2015 0958 Full Code [id number]  Rondel Jumbo, PA-C ED   07/01/2015 2317 07/04/2015 1856 Full Code [id number]  Ivor Costa, MD ED   06/16/2015 1638 06/29/2015 0855 Full Code [id number]  Delfin Gant, NP Inpatient   06/15/2015 1411 06/16/2015 1638 Full Code [id number]  Virgel Manifold, MD ED   04/19/2015 1831 04/21/2015 1844 Full Code [id number]  Kristeen Miss, MD Inpatient   09/14/2014 1801 09/20/2014 1827 Full Code [id number]  Wylene Simmer, MD Inpatient   08/07/2014 0649 08/10/2014 1733 Full Code [id number]  Larene Pickett, PA-C ED   10/12/2011 0030 10/14/2011 1616 Full Code [id number]  Joycelyn Man, RN Inpatient   Advance Care Planning Activity    Advance Directive Documentation     Most Recent Value  Type of Advance Directive  Healthcare Power of Attorney, Living will  Pre-existing out of facility DNR order (yellow form or pink MOST form)  -  "MOST" Form in Place?  -        IV Access:    Peripheral IV   Procedures and diagnostic studies:   [name] Ekg Site Rite  Result Date: 04/27/2019 If Site  Rite image not attached, placement could not be confirmed due to current cardiac rhythm.    Medical Consultants:    None.  Anti-Infectives:   IV remdesivir  Subjective:    DEVON LOVEN is sleepy today mumbling but not able to follow commands or answer questions as she just received Ativan at 6 AM.  Objective:    Vitals:   04/27/19 1650 04/27/19 2000 04/28/19 0000 04/28/19 0505  BP: (!) 153/97 (!) 153/97 (!) 144/69 126/90  Pulse: (!) 109 (!) 103 (!) 101 99  Resp: 19 20 19 17   Temp: 99.1 F (37.3 C) 98.3 F (36.8 C) 98.8 F (37.1 C) 98.5 F (36.9 C)  TempSrc: Axillary Axillary Axillary Axillary  SpO2: 95% 96% 94% 93%  Weight:      Height:       SpO2: 93 % O2 Flow Rate (L/min): 15 L/min   Intake/Output Summary (Last 24 hours) at 04/28/2019 0741 Last data filed at 04/28/2019 0500 Gross per 24 hour  Intake 720 ml  Output 700 ml  Net 20 ml   Filed Weights   04/23/2019 2200  Weight: 102.1 kg    Exam: General exam: In no acute distress. Respiratory system: Good air movement and diffuse crackles bilaterally. Cardiovascular system: S1 & S2 heard, RRR. No JVD. Gastrointestinal system: Abdomen is nondistended, soft and nontender.  Central nervous system: Withdraws her extremities to painful movement she is sleepy. Extremities: No pedal edema. Skin: No rashes, lesions or ulcers  Data Reviewed:    Labs: Basic Metabolic Panel: Recent Labs  Lab 04/25/19 0030 04/26/19 0043  NA 134* 140  K 4.7 5.8*  CL 104 109  CO2 22 13*  GLUCOSE 123* 172*  BUN 22 18  CREATININE 1.01* 0.80  CALCIUM 8.2* 9.1   GFR Estimated Creatinine Clearance: 69.7 mL/min (by C-G formula based on SCr of 0.8 mg/dL). Liver Function Tests: Recent Labs  Lab 04/25/19 0030 04/26/19 0043  AST 24 33  ALT 8 10  ALKPHOS 96 114  BILITOT 0.3 0.9  PROT 6.2* 6.8  ALBUMIN 2.1* 2.3*   No results for input(s): LIPASE, AMYLASE in the last 168 hours. No results for input(s): AMMONIA in the  last 168 hours. Coagulation profile No results for input(s): INR, PROTIME in the last 168 hours. COVID-19 Labs  Recent Labs    04/26/19 0043 04/26/19 0410  DDIMER  --  1.08*  FERRITIN 276  --   CRP 19.0*  --     Lab Results  Component Value Date   SARSCOV2NAA POSITIVE (A) 04/25/2019    CBC: Recent Labs  Lab 04/25/19 0030  WBC 8.4  NEUTROABS 5.5  HGB 11.9*  HCT 38.4  MCV 100.3*  PLT 318   Cardiac Enzymes: No results for input(s): CKTOTAL, CKMB, CKMBINDEX, TROPONINI in the last 168 hours. BNP (last 3 results) No results for input(s): PROBNP in the last 8760 hours. CBG: Recent Labs  Lab 04/27/19 0912  GLUCAP 224*   D-Dimer: Recent Labs    04/26/19 0410  DDIMER 1.08*   Hgb A1c: No results for input(s): HGBA1C in the last 72 hours. Lipid Profile: No results for input(s): CHOL, HDL, LDLCALC, TRIG, CHOLHDL, LDLDIRECT in the last 72 hours. Thyroid function studies: No results for input(s): TSH, T4TOTAL, T3FREE, THYROIDAB in the last 72 hours.  Invalid input(s): FREET3 Anemia work up: Recent Labs    04/26/19 0043  FERRITIN 276   Sepsis Labs: Recent Labs  Lab 04/25/19 0030 04/26/19 0043  PROCALCITON 1.86 0.50  WBC 8.4  --   LATICACIDVEN 1.5  --    Microbiology Recent Results (from the past 240 hour(s))  Blood Culture (routine x 2)     Status: None (Preliminary result)   Collection Time: 04/25/19 12:30 AM   Specimen: BLOOD LEFT HAND  Result Value Ref Range Status   Specimen Description   Final    BLOOD LEFT HAND Performed at [facility] [address], [place], [place] [address]    Special Requests   Final    BOTTLES DRAWN AEROBIC ONLY Blood Culture adequate volume Performed at [facility] [address], [place], [place] [address]    Culture   Final    NO GROWTH 2 DAYS Performed at [facility] [address], [place], [place] [address]    Report Status PENDING  Incomplete  Blood Culture  (routine x 2)     Status: None (Preliminary result)   Collection Time: 04/25/19  1:30 AM   Specimen: BLOOD  Result Value Ref Range Status   Specimen Description   Final    BLOOD  RIGHT ANTECUBITAL Performed at [facility] [address], [place], [place] [address]    Special Requests   Final    BOTTLES DRAWN AEROBIC AND ANAEROBIC Blood Culture adequate volume Performed at [facility] [address], [place], [place] [address]    Culture   Final    NO GROWTH 2 DAYS Performed at [facility] [address] [place], [place] [address]    Report Status PENDING  Incomplete  SARS CORONAVIRUS 2 (TAT 6-24 HRS) Nasopharyngeal Nasopharyngeal Swab     Status: Abnormal   Collection Time: 04/25/19  2:25 AM   Specimen: Nasopharyngeal Swab  Result Value Ref Range Status   SARS Coronavirus 2 POSITIVE (A) NEGATIVE Final    Comment: RESULT CALLED TO, READ BACK BY AND VERIFIED WITH: CALLED 3X NO ANSWER [facility] ED [phone], ALSO NO ANSWER THROUGH OPERATOR. (NOTE) SARS-CoV-2 target nucleic acids are DETECTED. The SARS-CoV-2 RNA is generally detectable in upper and lower respiratory specimens during the acute phase of infection. Positive results are indicative of active infection with SARS-CoV-2. Clinical  correlation with patient history and other diagnostic information is necessary to determine patient infection status. Positive results do  not rule out bacterial infection or co-infection with other viruses. The expected result is Negative. Fact Sheet for Patients: SugarRoll.be Fact Sheet for Healthcare Providers: https://www.woods-mathews.com/ This test is not yet approved or cleared by the [place] FDA and  has been authorized for detection and/or diagnosis of SARS-CoV-2 by FDA under an Emergency Use Authorization (EUA). This EUA will remain  in effect (mea ning this test can be used) for the duration of  the COVID-19 declaration under Section 564(b)(1) of the Act, 21 U.S.C. section 360bbb-3(b)(1), unless the authorization is terminated or revoked sooner. Performed at [facility], [place] [address], [place], [place] [address]      Medications:   . aspirin EC  325 mg Oral Daily  . atorvastatin  40 mg Oral q1800  . cholecalciferol  1,000 Units Oral Daily  . dexamethasone (DECADRON) injection  6 mg Intravenous Q12H  . enoxaparin (LOVENOX) injection  0.5 mg/kg Subcutaneous Q24H  . gabapentin  600 mg Oral TID WC & HS  . Ipratropium-Albuterol  1 puff Inhalation Q6H  . mouth rinse  15 mL Mouth Rinse BID  . metoprolol succinate  100 mg Oral Daily  . metoprolol succinate  50 mg Oral Daily  . pantoprazole  40 mg Oral Daily  . pneumococcal 23 valent vaccine  0.5 mL Intramuscular Tomorrow-1000  . pyridOXINE  50 mg Oral Daily  . sertraline  50 mg Oral Daily  . vitamin C  500 mg Oral Daily  . zinc sulfate  220 mg Oral Daily   Continuous Infusions: . ceFEPime (MAXIPIME) IV 2 g (04/28/19 0617)  . remdesivir 100 mg in NS 250 mL Stopped (04/27/19 1145)  . vancomycin Stopped (04/27/19 0920)      LOS: 3 days   Charlynne Cousins  [facility]  04/28/2019, 7:41 AM

## 2019-04-28 NOTE — Progress Notes (Signed)
Peripherally Inserted Central Catheter/Midline Placement  The IV Nurse has discussed with the patient and/or persons authorized to consent for the patient, the purpose of this procedure and the potential benefits and risks involved with this procedure.  The benefits include less needle sticks, lab draws from the catheter, and the patient may be discharged home with the catheter. Risks include, but not limited to, infection, bleeding, blood clot (thrombus formation), and puncture of an artery; nerve damage and irregular heartbeat and possibility to perform a PICC exchange if needed/ordered by physician.  Alternatives to this procedure were also discussed.  Bard Power PICC patient education guide, fact sheet on infection prevention and patient information card has been provided to patient /or left at bedside.    PICC/Midline Placement Documentation  PICC Single Lumen [id number] PICC Right Basilic 39 cm 0 cm (Active)  Indication for Insertion or Continuance of Line Prolonged intravenous therapies;Poor Vasculature-patient has had multiple peripheral attempts or PIVs lasting less than 24 hours 04/28/19 1147  Exposed Catheter (cm) 1 cm 04/28/19 1147  Site Assessment Clean;Dry;Intact 04/28/19 1147  Line Status Flushed;Blood return noted 04/28/19 1147  Dressing Type Transparent 04/28/19 1147  Dressing Status Clean;Dry;Intact;Antimicrobial disc in place 04/28/19 1147  Dressing Intervention New dressing 04/28/19 1147  Dressing Change Due 05/05/19 04/28/19 1147   Telephone consent signed by Daughter in law   [name] 04/28/2019, 11:48 AM

## 2019-04-28 NOTE — Progress Notes (Signed)
ABG values given to Dr. Aileen Fass.

## 2019-04-28 NOTE — Evaluation (Signed)
Physical Therapy Evaluation Patient Details Name: [name] MRN: [id number] DOB: 1941-05-21 Today's Date: 04/28/2019   History of Present Illness  78 y.o. female past medical history significant for hypertension TIA anxiety depression presents to the hospital from [facility] via EMS for hypoxia she tested positive for COVID-19 on 04/10/2019. pt admitted with acute respiratory failure with hypoxia due to COVID 19 PNA.  Clinical Impression   Pt admitted with above diagnosis. PTA not fully know chart reports she was ambulatory with AD, she will not give full hx becomes agitated when asked about hx and PLOF. Pt currently with functional limitations due to the deficits listed below (see PT Problem List). Currently mnimaly able to move all extremities, able to get to EOB w/ mod-max a, gives very poor effort in completing tasks, needs max encouragement to participate in functional mobility. Able to stand with max a and maintain approx 30sec max, all while c/o feeling nauseated and just wanting to lay down. Pt on 15L/min via HFNC and also NRB. She has apparently being taking NRB off.  Sats fluctuate during session decreasing to low 80s with mobility but once settled pt able to recover very quickly back to 90s. Pt will benefit from skilled PT to increase their independence and safety with mobility to allow discharge to the venue listed below.       Follow Up Recommendations SNF    Equipment Recommendations  None recommended by PT    Recommendations for Other Services OT consult     Precautions / Restrictions Precautions Precautions: Fall Restrictions Weight Bearing Restrictions: No      Mobility  Bed Mobility Overal bed mobility: Needs Assistance Bed Mobility: Supine to Sit;Sit to Supine     Supine to sit: Mod assist;Max assist Sit to supine: Mod assist;Max assist   General bed mobility comments: able to get to EOb with moda-max a and cues, once sitting pt is coughing and leaning  forward as if to pitch off bed, stating she is feeling nauseated, yelling at therapist and sitter   Transfers Overall transfer level: Needs assistance Equipment used: 1 person hand held assist Transfers: Sit to/from Stand Sit to Stand: Max assist         General transfer comment: barely stands EOB to clean up wet sheets   Ambulation/Gait             General Gait Details: did not ambulate this session  Stairs            Wheelchair Mobility    Modified Rankin (Stroke Patients Only)       Balance Overall balance assessment: Needs assistance Sitting-balance support: Feet unsupported;Bilateral upper extremity supported Sitting balance-Leahy Scale: Poor   Postural control: Left lateral lean;Right lateral lean Standing balance support: Bilateral upper extremity supported Standing balance-Leahy Scale: Poor                               Pertinent Vitals/Pain Pain Assessment: (hard to assess)    Home Living Family/patient expects to be discharged to:: Skilled nursing facility                      Prior Function           Comments: unknown pt gives guarded hx and gets upset when asked to elaborate     Hand Dominance   Dominant Hand: Right    Extremity/Trunk Assessment   Upper Extremity Assessment  Upper Extremity Assessment: Generalized weakness    Lower Extremity Assessment Lower Extremity Assessment: Generalized weakness       Communication   Communication: HOH  Cognition Arousal/Alertness: Lethargic Behavior During Therapy: Agitated Overall Cognitive Status: No family/caregiver present to determine baseline cognitive functioning                                        General Comments      Exercises     Assessment/Plan    PT Assessment Patient needs continued PT services  PT Problem List Decreased strength;Decreased activity tolerance;Decreased balance;Decreased mobility;Decreased range of  motion;Decreased coordination;Decreased safety awareness;Decreased cognition       PT Treatment Interventions DME instruction;Gait training;Functional mobility training;Therapeutic activities;Therapeutic exercise;Balance training;Neuromuscular re-education;Patient/family education    PT Goals (Current goals can be found in the Care Plan section)  Acute Rehab PT Goals Patient Stated Goal: none Time For Goal Achievement: 05/12/19 Potential to Achieve Goals: Fair    Frequency Min 2X/week   Barriers to discharge        Co-evaluation               AM-PAC PT "6 Clicks" Mobility  Outcome Measure Help needed turning from your back to your side while in a flat bed without using bedrails?: A Lot Help needed moving from lying on your back to sitting on the side of a flat bed without using bedrails?: A Lot Help needed moving to and from a bed to a chair (including a wheelchair)?: A Lot Help needed standing up from a chair using your arms (e.g., wheelchair or bedside chair)?: A Lot Help needed to walk in hospital room?: Total Help needed climbing 3-5 steps with a railing? : Total 6 Click Score: 10    End of Session Equipment Utilized During Treatment: Oxygen Activity Tolerance: Treatment limited secondary to medical complications (Comment);Patient limited by lethargy;Patient limited by fatigue Patient left: in bed;with call bell/phone within reach;with nursing/sitter in room Nurse Communication: Mobility status PT Visit Diagnosis: Unsteadiness on feet (R26.81);Muscle weakness (generalized) (M62.81)    Time: 1008-1030 PT Time Calculation (min) (ACUTE ONLY): 22 min   Charges:   PT Evaluation $PT Eval Moderate Complexity: 1 Mod PT Treatments $Therapeutic Activity: 8-22 mins         Horald Chestnut, PT  Delford Field 04/28/2019, 1:19 PM

## 2019-04-29 ENCOUNTER — Inpatient Hospital Stay (HOSPITAL_COMMUNITY): Payer: Medicare HMO

## 2019-04-29 DIAGNOSIS — R0902 Hypoxemia: Secondary | ICD-10-CM

## 2019-04-29 DIAGNOSIS — R0602 Shortness of breath: Secondary | ICD-10-CM

## 2019-04-29 LAB — C-REACTIVE PROTEIN: CRP: 2.8 mg/dL — ABNORMAL HIGH (ref <1.0)

## 2019-04-29 LAB — CBC WITH DIFFERENTIAL/PLATELET
Abs Immature Granulocytes: 0.13 10*3/uL — ABNORMAL HIGH (ref 0.00–0.07)
Basophils Absolute: 0 10*3/uL (ref 0.0–0.1)
Basophils Relative: 0 %
Eosinophils Absolute: 0 10*3/uL (ref 0.0–0.5)
Eosinophils Relative: 0 %
HCT: 42 % (ref 36.0–46.0)
Hemoglobin: 13.2 g/dL (ref 12.0–15.0)
Immature Granulocytes: 1 %
Lymphocytes Relative: 12 %
Lymphs Abs: 1.1 10*3/uL (ref 0.7–4.0)
MCH: 30.3 pg (ref 26.0–34.0)
MCHC: 31.4 g/dL (ref 30.0–36.0)
MCV: 96.6 fL (ref 80.0–100.0)
Monocytes Absolute: 0.6 10*3/uL (ref 0.1–1.0)
Monocytes Relative: 6 %
Neutro Abs: 7.4 10*3/uL (ref 1.7–7.7)
Neutrophils Relative %: 81 %
Platelets: 428 10*3/uL — ABNORMAL HIGH (ref 150–400)
RBC: 4.35 MIL/uL (ref 3.87–5.11)
RDW: 15.3 % (ref 11.5–15.5)
WBC: 9.2 10*3/uL (ref 4.0–10.5)
nRBC: 0 % (ref 0.0–0.2)

## 2019-04-29 LAB — COMPREHENSIVE METABOLIC PANEL
ALT: 9 U/L (ref 0–44)
AST: 26 U/L (ref 15–41)
Albumin: 2.6 g/dL — ABNORMAL LOW (ref 3.5–5.0)
Alkaline Phosphatase: 104 U/L (ref 38–126)
Anion gap: 9 (ref 5–15)
BUN: 17 mg/dL (ref 8–23)
CO2: 28 mmol/L (ref 22–32)
Calcium: 9.7 mg/dL (ref 8.9–10.3)
Chloride: 104 mmol/L (ref 98–111)
Creatinine, Ser: 0.62 mg/dL (ref 0.44–1.00)
GFR calc Af Amer: >60 mL/min (ref >60)
GFR calc non Af Amer: >60 mL/min (ref >60)
Glucose, Bld: 246 mg/dL — ABNORMAL HIGH (ref 70–99)
Potassium: 3.9 mmol/L (ref 3.5–5.1)
Sodium: 141 mmol/L (ref 135–145)
Total Bilirubin: 0.4 mg/dL (ref 0.3–1.2)
Total Protein: 7.1 g/dL (ref 6.5–8.1)

## 2019-04-29 LAB — POCT I-STAT 7, (LYTES, BLD GAS, ICA,H+H)
Acid-Base Excess: 6 mmol/L — ABNORMAL HIGH (ref 0.0–2.0)
Bicarbonate: 30.5 mmol/L — ABNORMAL HIGH (ref 20.0–28.0)
Calcium, Ion: 1.36 mmol/L (ref 1.15–1.40)
HCT: 42 % (ref 36.0–46.0)
Hemoglobin: 14.3 g/dL (ref 12.0–15.0)
O2 Saturation: 89 %
Patient temperature: 98.4
Potassium: 4.2 mmol/L (ref 3.5–5.1)
Sodium: 139 mmol/L (ref 135–145)
TCO2: 32 mmol/L (ref 22–32)
pCO2 arterial: 42.3 mmHg (ref 32.0–48.0)
pH, Arterial: 7.465 — ABNORMAL HIGH (ref 7.350–7.450)
pO2, Arterial: 52 mmHg — ABNORMAL LOW (ref 83.0–108.0)

## 2019-04-29 LAB — BRAIN NATRIURETIC PEPTIDE: B Natriuretic Peptide: 263.2 pg/mL — ABNORMAL HIGH (ref 0.0–100.0)

## 2019-04-29 LAB — D-DIMER, QUANTITATIVE: D-Dimer, Quant: 0.59 ug/mL-FEU — ABNORMAL HIGH (ref 0.00–0.50)

## 2019-04-29 MED ORDER — SUMATRIPTAN SUCCINATE 25 MG PO TABS
25.0000 mg | ORAL_TABLET | ORAL | Status: DC | PRN
  Administered 2019-04-29 – 2019-05-03 (×3): 25 mg via ORAL
  Filled 2019-04-29 (×6): qty 1

## 2019-04-29 MED ORDER — FUROSEMIDE 10 MG/ML IJ SOLN
40.0000 mg | Freq: Once | INTRAMUSCULAR | Status: AC
  Administered 2019-04-29: 15:00:00 40 mg via INTRAVENOUS
  Filled 2019-04-29: qty 4

## 2019-04-29 NOTE — Progress Notes (Signed)
Rapid Response Event Note  Overview: PCU Charge RN called this  RN and stated a rapid response was being initiated on this pt.       Initial Focused Assessment: Upon entering the room Pt laying in bed with eyes closed O2 sats low 90's on 15L NRB and 15L HFNC. BP slightly hypertensive. RR 20's. Pt oriented X4 and did wake up when this RN spoke to her   Interventions: STAT ABG obtained. Lungs clear on auscultation. No distress noted. Pt speaking in full complete sentences.  Plan of Care (if not transferred): Cont to monitor pt but put on MD and charge watch list for day shift due to increase in O2. MD at bedside agreed to plan of care. Pt VS all stable. Water added to HFNC bottle.   Margaret Pyle

## 2019-04-29 NOTE — Progress Notes (Signed)
Entered room to get vitals on patient.  Pt work of breathing is worse, accessory muscle use & retractions.  Pt is at maximum oxygen support for the floor: 15L HFNC and 15L NRB.  Rapid Response called and assessed.

## 2019-04-29 NOTE — Evaluation (Addendum)
Clinical/Bedside Swallow Evaluation Patient Details  Name: [name] MRN: [id number] Date of Birth: 1941/05/09  Today's Date: 04/29/2019 Time: SLP Start Time (ACUTE ONLY): [id number] SLP Stop Time (ACUTE ONLY): 1613 SLP Time Calculation (min) (ACUTE ONLY): 22 min  Past Medical History:  Past Medical History:  Diagnosis Date  . Anxiety   . Arthritis    djd  . Blood transfusion without reported diagnosis   . Cataracts, bilateral    immature   . Cellulitis    10/11/11 hospitalized for cellulitis   . Chicken pox   . Chronic back pain    scoliosis/stenosis  . Depression    takes Zoloft daily  . Elbow fracture, left April 2015  . GERD (gastroesophageal reflux disease)    hx pud '98  . Glaucoma   . Hyperlipemia    takes Atorvastatin daily  . Hypertension    takes HCTZ daily  . Insomnia   . Migraine   . Peripheral neuropathy   . 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 Radiculopathy, lumbar region   . Restless leg syndrome    takes Sinemet daily  . TIA (transient ischemic attack)    Possible TIA per 2013 DC summary  . Vertigo    Past Surgical History:  Past Surgical History:  Procedure Laterality Date  . ABDOMINAL HYSTERECTOMY  1975   bil oophorectomy  . APPENDECTOMY    . BACK SURGERY   MANY YRS AGO   laminectomy x3  . CARPAL TUNNEL RELEASE  07/09/2012   Procedure: CARPAL TUNNEL RELEASE;  Surgeon: Magnus Sinning, MD;  Location: [facility];  Service: Orthopedics;  Laterality: Left;  . cervical disc surgery x2  2011  . CHOLECYSTECTOMY  1993  . colonosocpy    . ESOPHAGOGASTRODUODENOSCOPY    . FINGER ARTHROPLASTY  07/09/2012   Procedure: FINGER ARTHROPLASTY;  Surgeon: Magnus Sinning, MD;  Location: [facility];  Service: Orthopedics;  Laterality: Left;  Interposition Arthroplasty CMC Joint Thumb Left   . HEMATOMA EVACUATION  2011   s/p rt cea  . JOINT REPLACEMENT  '01   total knee replacement, LEFT  . left knee arthroscopy  2001  . LIPOMA EXCISION  07/09/2012   Procedure: EXCISION LIPOMA;  Surgeon: Magnus Sinning, MD;  Location: [facility];  Service: Orthopedics;  Laterality: Left;  Excision Lipoma Dorsum Left Wrist   . LUMBAR LAMINECTOMY WITH COFLEX 1 LEVEL Bilateral 04/19/2015   Procedure: LUMBAR TWO-THREE LUMBAR LAMINECTOMY WITH COFLEX ;  Surgeon: Kristeen Miss, MD;  Location: [facility];  Service: Neurosurgery;  Laterality: Bilateral;  Bilateral L23 laminectomy and foraminotomy with coflex  . LUMBAR LAMINECTOMY/DECOMPRESSION MICRODISCECTOMY Right 12/19/2015   Procedure: Right Lumbar two-three  Microdiskectomy;  Surgeon: Kristeen Miss, MD;  Location: [facility];  Service: Neurosurgery;  Laterality: Right;  . ORIF ANKLE FRACTURE Right 09/14/2014   FIBULA   . ORIF ANKLE FRACTURE Right 09/14/2014   Procedure: OPEN REDUCTION INTERNAL FIXATION (ORIF) RIGHT ANKLE FRACTURE/SYNDESMOSIS ;  Surgeon: Wylene Simmer, MD;  Location: [facility];  Service: Orthopedics;  Laterality: Right;  . right knee replacement     09/2011   . rt carotid enarterectomy  2011  . rt knee arthroscopy  '99  . SHOULDER ARTHROSCOPY Right   . TOTAL KNEE ARTHROPLASTY  09/13/2011   Procedure: TOTAL KNEE ARTHROPLASTY;  Surgeon: Magnus Sinning, MD;  [facility];  Service: Orthopedics;  Laterality: Right;   HPI:  Pt is a 78 year old female admitted  from SNF with hypoxic respiratory failure after testing positive for COVID-19 on 11/16. PMH includes: HTN, TIA, anxiety, depression, GERD   Assessment / Plan / Recommendation Clinical Impression  Pt has a nasal cannula and a NRB mask and transiently removes the mask with assist to take small bites and sips. She has frequent throat clearing regardless of consistency, which may be related to her h/o GERD. Her mentation seems appropriate - much improved per RN report even compared to this morning. I think her fluctuating mentation and her decreased respiratory status would put her at risk for intermittent aspiration, which could be reduced with the  right support. I would offer POs only when mental status allows for it. Supervision and assistance with adjusting her mask on/off to give her frequent rest breaks. I would focus on small amounts of intake at a time, sitting upright during and at least 30 minutes after. Will adjust current diet of purees down to full liquid diet per pt preference. I think that with an increase in her endurance and more stable mentation, she should be able to advance to more solids. Will f/u for readiness.  SLP Visit Diagnosis: Dysphagia, unspecified (R13.10)    Aspiration Risk  Mild aspiration risk;Moderate aspiration risk    Diet Recommendation Thin liquid(full liquid diet)   Liquid Administration via: Cup;Straw Medication Administration: Whole meds with puree Supervision: Staff to assist with self feeding;Full supervision/cueing for compensatory strategies Compensations: Slow rate;Small sips/bites;Minimize environmental distractions;Other (Comment)(small amounts at a time; frequent rest breaks for breathing) Postural Changes: Seated upright at 90 degrees;Remain upright for at least 30 minutes after po intake    Other  Recommendations Oral Care Recommendations: Oral care QID   Follow up Recommendations (tba)      Frequency and Duration min 2x/week  2 weeks       Prognosis Prognosis for Safe Diet Advancement: Good      Swallow Study   General HPI: Pt is a 78 year old female admitted from SNF with hypoxic respiratory failure after testing positive for COVID-19 on 11/16. PMH includes: HTN, TIA, anxiety, depression, GERD Type of Study: Bedside Swallow Evaluation Previous Swallow Assessment: none in chart Diet Prior to this Study: Dysphagia 1 (puree);Thin liquids Temperature Spikes Noted: No Respiratory Status: Nasal cannula;Non-rebreather History of Recent Intubation: No Behavior/Cognition: Alert;Cooperative;Pleasant mood Oral Cavity Assessment: Within Functional Limits Oral Care Completed by SLP:  No Oral Cavity - Dentition: Adequate natural dentition Vision: Functional for self-feeding Self-Feeding Abilities: Able to feed self Patient Positioning: Upright in bed Baseline Vocal Quality: Normal    Oral/Motor/Sensory Function Overall Oral Motor/Sensory Function: Within functional limits   Ice Chips Ice chips: Not tested   Thin Liquid Thin Liquid: Impaired Presentation: Self Fed;Straw Pharyngeal  Phase Impairments: Throat Clearing - Delayed    Nectar Thick Nectar Thick Liquid: Not tested   Honey Thick Honey Thick Liquid: Not tested   Puree Puree: Impaired Presentation: Self Fed;Spoon Pharyngeal Phase Impairments: Throat Clearing - Delayed   Solid     Solid: Not tested      Venita Sheffield Brelynn Wheller 04/29/2019,4:37 PM  Pollyann Glen, M.A. [facility] Acute Environmental education officer [phone] Office [phone]

## 2019-04-29 NOTE — Progress Notes (Signed)
PROGRESS NOTE  [name] [id number] DOB: 31-May-1941 DOA: 04/13/2019 PCP: Virgel Bouquet, MD   LOS: 4 days   Brief Narrative / Interim history: 78 year old female with hypertension, prior TIA, anxiety, depression came into the hospital from [facility] for hypoxic respiratory failure.  She tested positive for COVID-19 on 04/10/2019, and she was found to be hypoxic and was brought to the emergency room requiring 5 L nasal cannula.  Chest x-ray showed multifocal pneumonia  Subjective / 24h Interval events: She complains of a headache, does not really complain of dyspnea.  No chest pain, no nausea or vomiting, no abdominal pain  Assessment & Plan: Principal Problem Acute hypoxic respiratory failure with hypoxia due to COVID-19 pneumonia -[name] currently requiring 15 L high flow nasal cannula and maintaining good sats with that -Rapid response was called twice since yesterday for increased WOB -On remdesivir and steroids, to finish antivirals 11/25 -Received Actemra on 11/23 -Oxygenation worsening, on 15 L, repeat chest x-ray, she received IV fluids and will give Lasix x1, obtain BNP.  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 amLODipine  10 mg Oral QHS  . aspirin EC  325 mg Oral Daily  . atorvastatin  40 mg Oral q1800  . Chlorhexidine Gluconate Cloth  6 each Topical Daily  .  cholecalciferol  1,000 Units Oral Daily  . dexamethasone (DECADRON) injection  6 mg Intravenous Q12H  . enoxaparin (LOVENOX) injection  0.5 mg/kg Subcutaneous Q24H  . gabapentin  600 mg Oral TID WC & HS  . Ipratropium-Albuterol  1 puff Inhalation Q6H  . mouth rinse  15 mL Mouth Rinse BID  . metoprolol succinate  100 mg Oral Daily  . metoprolol succinate  50 mg Oral Daily  . pantoprazole  40 mg Oral Daily  . pneumococcal 23 valent vaccine  0.5 mL Intramuscular Tomorrow-1000  . pyridOXINE  50 mg Oral Daily  . sertraline  50 mg Oral Daily  . sodium chloride flush  10-40 mL Intracatheter Q12H  . traZODone  50 mg Oral QHS  . vitamin C  500 mg Oral Daily  . zinc sulfate  220 mg Oral Daily   Continuous Infusions: PRN Meds:.acetaminophen, albuterol, chlorpheniramine-HYDROcodone, cyclobenzaprine, guaiFENesin-dextromethorphan, haloperidol lactate, metoprolol tartrate, morphine injection, ondansetron (ZOFRAN) IV, oxyCODONE, sodium chloride, sodium chloride flush, SUMAtriptan, zolpidem  DVT prophylaxis: Lovenox Code Status: Full code Family Communication: Called son Francene Boyers ([name]), [phone], [phone], at both numbers, no answer Disposition Plan: home when stable Consultants: None   Procedures:  None   Microbiology  None   Antimicrobials: None     Objective: Vitals:   04/29/19 0733 04/29/19 0923 04/29/19 1117 04/29/19 1258  BP: (!) 157/72  (!) 163/84   Pulse: 88  (!) 109   Resp: 15  20   Temp: 98.1 F (36.7 C)  97.7 F (36.5 C)   TempSrc: Axillary  Oral   SpO2: 100% 96% 96% 98%  Weight:      Height:        Intake/Output  Summary (Last 24 hours) at 04/29/2019 1332 Last data filed at 04/29/2019 1120 Gross per 24 hour  Intake 250 ml  Output 1210 ml  Net -960 ml   Filed Weights   04/14/2019 2200  Weight: 102.1 kg    Examination:  Constitutional: seen twice, early am difficult to arouse, later at lunchtime alert, confused Eyes: no scleral icterus ENMT:  Mucous membranes are moist.  Neck: normal, supple Respiratory: Diminished at the bases, no wheezing Cardiovascular: Regular rate and rhythm, no murmurs / rubs / gallops.  No edema Abdomen: no tenderness. Bowel sounds positive.  Musculoskeletal: no clubbing / cyanosis.  Skin: no rashes Neurologic: No focal deficits, intermittently follows commands  Data Reviewed: I have independently reviewed following labs and imaging studies   CBC: Recent Labs  Lab 04/25/19 0030 04/28/19 1455 04/28/19 1529 04/29/19 0458 04/29/19 0503  WBC 8.4  --  9.2  --  9.2  NEUTROABS 5.5  --  7.5  --  7.4  HGB 11.9* 14.3 13.0 14.3 13.2  HCT 38.4 42.0 41.5 42.0 42.0  MCV 100.3*  --  96.7  --  96.6  PLT 318  --  467*  --  123456*   Basic Metabolic Panel: Recent Labs  Lab 04/25/19 0030 04/26/19 0043 04/28/19 1455 04/28/19 1529 04/29/19 0458 04/29/19 0503  NA 134* 140 138 137 139 141  K 4.7 5.8* 3.6 3.7 4.2 3.9  CL 104 109  --  103  --  104  CO2 22 13*  --  22  --  28  GLUCOSE 123* 172*  --  237*  --  246*  BUN 22 18  --  16  --  17  CREATININE 1.01* 0.80  --  0.57  --  0.62  CALCIUM 8.2* 9.1  --  9.1  --  9.7   Liver Function Tests: Recent Labs  Lab 04/25/19 0030 04/26/19 0043 04/28/19 1529 04/29/19 0503  AST 24 33 34 26  ALT 8 10 11 9   ALKPHOS 96 114 101 104  BILITOT 0.3 0.9 0.9 0.4  PROT 6.2* 6.8 7.2 7.1  ALBUMIN 2.1* 2.3* 2.7* 2.6*   Coagulation Profile: No results for input(s): INR, PROTIME in the last 168 hours. HbA1C: No results for input(s): HGBA1C in the last 72 hours. CBG: Recent Labs  Lab 04/27/19 0912 04/28/19 0742  GLUCAP 224* 210*    Recent Results (from the past 240 hour(s))  Blood Culture (routine x 2)     Status: None (Preliminary result)   Collection Time: 04/25/19 12:30 AM   Specimen: BLOOD LEFT HAND  Result Value Ref Range Status   Specimen Description   Final    BLOOD LEFT HAND Performed at [facility] [address]  [place], [place] [address]    Special Requests   Final    BOTTLES DRAWN AEROBIC ONLY Blood Culture adequate volume Performed at [facility] [address], [place], [place] [address]    Culture   Final    NO GROWTH 4 DAYS Performed at [facility], [place] [address], [place], [place] [address]    Report Status PENDING  Incomplete  Blood Culture (routine x 2)     Status: None (Preliminary result)   Collection Time: 04/25/19  1:30 AM   Specimen: BLOOD  Result Value Ref Range Status   Specimen Description   Final    BLOOD RIGHT ANTECUBITAL Performed at [facility] [address] [place], [place] [address]  Special Requests   Final    BOTTLES DRAWN AEROBIC AND ANAEROBIC Blood Culture adequate volume Performed at [facility] [address], [place], [place] [address]    Culture   Final    NO GROWTH 4 DAYS Performed at [facility], [place] [address] [place], [place] [address]    Report Status PENDING  Incomplete  SARS CORONAVIRUS 2 (TAT 6-24 HRS) Nasopharyngeal Nasopharyngeal Swab     Status: Abnormal   Collection Time: 04/25/19  2:25 AM   Specimen: Nasopharyngeal Swab  Result Value Ref Range Status   SARS Coronavirus 2 POSITIVE (A) NEGATIVE Final    Comment: RESULT CALLED TO, READ BACK BY AND VERIFIED WITH: CALLED 3X NO ANSWER [facility] ED [phone], ALSO NO ANSWER THROUGH OPERATOR. (NOTE) SARS-CoV-2 target nucleic acids are DETECTED. The SARS-CoV-2 RNA is generally detectable in upper and lower respiratory specimens during the acute phase of infection. Positive results are indicative of active infection with SARS-CoV-2. Clinical  correlation with patient history and other diagnostic information is necessary to determine patient infection status. Positive results do  not rule out bacterial infection or co-infection with other viruses. The expected result is Negative. Fact Sheet for Patients:  SugarRoll.be Fact Sheet for Healthcare Providers: https://www.woods-mathews.com/ This test is not yet approved or cleared by the [place] FDA and  has been authorized for detection and/or diagnosis of SARS-CoV-2 by FDA under an Emergency Use Authorization (EUA). This EUA will remain  in effect (mea ning this test can be used) for the duration of the COVID-19 declaration under Section 564(b)(1) of the Act, 21 U.S.C. section 360bbb-3(b)(1), unless the authorization is terminated or revoked sooner. Performed at [facility], [place] [address], [place], [place] [address]      Radiology Studies: No results found.   Time spent: 35 minutes, more than 50% at bedside and 2 separate visits   Marzetta Board, MD, PhD [facility]  Between 7 am - 7 pm I am available, please contact me via Amion or Securechat  Between 7 pm - 7 am I am not available, please contact night coverage MD/APP via Amion

## 2019-04-29 NOTE — Progress Notes (Signed)
Notified both sons of progress.  All questions were answered and this nurse's contact number shared for further communication.

## 2019-04-30 LAB — CBC WITH DIFFERENTIAL/PLATELET
Abs Immature Granulocytes: 0.1 10*3/uL — ABNORMAL HIGH (ref 0.00–0.07)
Basophils Absolute: 0 10*3/uL (ref 0.0–0.1)
Basophils Relative: 0 %
Eosinophils Absolute: 0 10*3/uL (ref 0.0–0.5)
Eosinophils Relative: 0 %
HCT: 40.6 % (ref 36.0–46.0)
Hemoglobin: 12.9 g/dL (ref 12.0–15.0)
Immature Granulocytes: 1 %
Lymphocytes Relative: 9 %
Lymphs Abs: 1 10*3/uL (ref 0.7–4.0)
MCH: 30.4 pg (ref 26.0–34.0)
MCHC: 31.8 g/dL (ref 30.0–36.0)
MCV: 95.5 fL (ref 80.0–100.0)
Monocytes Absolute: 0.7 10*3/uL (ref 0.1–1.0)
Monocytes Relative: 7 %
Neutro Abs: 9.4 10*3/uL — ABNORMAL HIGH (ref 1.7–7.7)
Neutrophils Relative %: 83 %
Platelets: 420 10*3/uL — ABNORMAL HIGH (ref 150–400)
RBC: 4.25 MIL/uL (ref 3.87–5.11)
RDW: 14.9 % (ref 11.5–15.5)
WBC: 11.2 10*3/uL — ABNORMAL HIGH (ref 4.0–10.5)
nRBC: 0 % (ref 0.0–0.2)

## 2019-04-30 LAB — COMPREHENSIVE METABOLIC PANEL
ALT: 13 U/L (ref 0–44)
AST: 31 U/L (ref 15–41)
Albumin: 2.5 g/dL — ABNORMAL LOW (ref 3.5–5.0)
Alkaline Phosphatase: 88 U/L (ref 38–126)
Anion gap: 9 (ref 5–15)
BUN: 20 mg/dL (ref 8–23)
CO2: 31 mmol/L (ref 22–32)
Calcium: 9.1 mg/dL (ref 8.9–10.3)
Chloride: 99 mmol/L (ref 98–111)
Creatinine, Ser: 0.62 mg/dL (ref 0.44–1.00)
GFR calc Af Amer: >60 mL/min (ref >60)
GFR calc non Af Amer: >60 mL/min (ref >60)
Glucose, Bld: 318 mg/dL — ABNORMAL HIGH (ref 70–99)
Potassium: 3.5 mmol/L (ref 3.5–5.1)
Sodium: 139 mmol/L (ref 135–145)
Total Bilirubin: 0.5 mg/dL (ref 0.3–1.2)
Total Protein: 6.2 g/dL — ABNORMAL LOW (ref 6.5–8.1)

## 2019-04-30 LAB — CULTURE, BLOOD (ROUTINE X 2)
Culture: NO GROWTH
Culture: NO GROWTH
Special Requests: ADEQUATE
Special Requests: ADEQUATE

## 2019-04-30 LAB — C-REACTIVE PROTEIN: CRP: 1 mg/dL — ABNORMAL HIGH (ref <1.0)

## 2019-04-30 LAB — D-DIMER, QUANTITATIVE: D-Dimer, Quant: 0.75 ug/mL-FEU — ABNORMAL HIGH (ref 0.00–0.50)

## 2019-04-30 MED ORDER — FUROSEMIDE 10 MG/ML IJ SOLN
40.0000 mg | Freq: Two times a day (BID) | INTRAMUSCULAR | Status: DC
  Administered 2019-04-30 – 2019-05-06 (×13): 40 mg via INTRAVENOUS
  Filled 2019-04-30 (×13): qty 4

## 2019-04-30 NOTE — Progress Notes (Signed)
PROGRESS NOTE  [name] [id number] DOB: 1941/05/29 DOA: 05/01/2019 PCP: Virgel Bouquet, MD   LOS: 5 days   Brief Narrative / Interim history: 78 year old female with hypertension, prior TIA, anxiety, depression came into the hospital from [facility] for hypoxic respiratory failure.  She tested positive for COVID-19 on 04/10/2019, and she was found to be hypoxic and was brought to the emergency room requiring 5 L nasal cannula.  Chest x-ray showed multifocal pneumonia  Subjective / 24h Interval events: She complains of a headache, does not really complain of dyspnea.  No chest pain, no nausea or vomiting, no abdominal pain  Assessment & Plan: Principal Problem Acute hypoxic respiratory failure with hypoxia due to COVID-19 pneumonia -[name] currently requiring 15 L high flow nasal cannula and maintaining good sats with that -On remdesivir and steroids, finished remdesivir on 11/25 -Received Actemra on 11/23 -Oxygenation has been stable on 15 L, was briefly on 10 L last night.  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 amLODipine  10 mg Oral QHS  . aspirin EC  325 mg Oral Daily  . atorvastatin  40 mg Oral q1800  . Chlorhexidine Gluconate  Cloth  6 each Topical Daily  . cholecalciferol  1,000 Units Oral Daily  . dexamethasone (DECADRON) injection  6 mg Intravenous Q12H  . enoxaparin (LOVENOX) injection  0.5 mg/kg Subcutaneous Q24H  . furosemide  40 mg Intravenous Q12H  . gabapentin  600 mg Oral TID WC & HS  . Ipratropium-Albuterol  1 puff Inhalation Q6H  . mouth rinse  15 mL Mouth Rinse BID  . metoprolol succinate  100 mg Oral Daily  . metoprolol succinate  50 mg Oral Daily  . pantoprazole  40 mg Oral Daily  . pneumococcal 23 valent vaccine  0.5 mL Intramuscular Tomorrow-1000  . pyridOXINE  50 mg Oral Daily  . sertraline  50 mg Oral Daily  . sodium chloride flush  10-40 mL Intracatheter Q12H  . traZODone  50 mg Oral QHS  . vitamin C  500 mg Oral Daily  . zinc sulfate  220 mg Oral Daily   Continuous Infusions: PRN Meds:.acetaminophen, albuterol, chlorpheniramine-HYDROcodone, cyclobenzaprine, guaiFENesin-dextromethorphan, haloperidol lactate, metoprolol tartrate, morphine injection, ondansetron (ZOFRAN) IV, oxyCODONE, sodium chloride, sodium chloride flush, SUMAtriptan  DVT prophylaxis: Lovenox Code Status: Full code Family Communication: d/w Francene Boyers [facility]), [phone], [phone] Disposition Plan: home when stable Consultants: None   Procedures:  None   Microbiology  None   Antimicrobials: None     Objective: Vitals:   04/30/19 0609 04/30/19 0744 04/30/19 1012 04/30/19 1254  BP:  (!) 147/67 (!) 153/63 (!) 154/76  Pulse: 86 100 (!) 102   Resp: 15 18  20   Temp:  98 F (36.7 C)    TempSrc:  Oral    SpO2: 100% 93%    Weight:      Height:  Intake/Output Summary (Last 24 hours) at 04/30/2019 1434 Last data filed at 04/30/2019 1300 Gross per 24 hour  Intake 1020 ml  Output 1815 ml  Net -795 ml   Filed Weights   05/03/2019 2200  Weight: 102.1 kg    Examination:  Constitutional: no apparent distress Eyes: no icterus  ENMT: mmm Neck: normal, supple Respiratory: diminished at  the bases, no wheezing, no crackles  Cardiovascular: RRR, no MRG. Trace edema  Abdomen: soft, NT, ND, BS+ Musculoskeletal: no clubbing / cyanosis.  Skin: no rashes Neurologic: non focal   Data Reviewed: I have independently reviewed following labs and imaging studies   CBC: Recent Labs  Lab 04/25/19 0030 04/28/19 1455 04/28/19 1529 04/29/19 0458 04/29/19 0503 04/30/19 0725  WBC 8.4  --  9.2  --  9.2 11.2*  NEUTROABS 5.5  --  7.5  --  7.4 9.4*  HGB 11.9* 14.3 13.0 14.3 13.2 12.9  HCT 38.4 42.0 41.5 42.0 42.0 40.6  MCV 100.3*  --  96.7  --  96.6 95.5  PLT 318  --  467*  --  428* 0000000*   Basic Metabolic Panel: Recent Labs  Lab 04/25/19 0030 04/26/19 0043 04/28/19 1455 04/28/19 1529 04/29/19 0458 04/29/19 0503 04/30/19 0725  NA 134* 140 138 137 139 141 139  K 4.7 5.8* 3.6 3.7 4.2 3.9 3.5  CL 104 109  --  103  --  104 99  CO2 22 13*  --  22  --  28 31  GLUCOSE 123* 172*  --  237*  --  246* 318*  BUN 22 18  --  16  --  17 20  CREATININE 1.01* 0.80  --  0.57  --  0.62 0.62  CALCIUM 8.2* 9.1  --  9.1  --  9.7 9.1   Liver Function Tests: Recent Labs  Lab 04/25/19 0030 04/26/19 0043 04/28/19 1529 04/29/19 0503 04/30/19 0725  AST 24 33 34 26 31  ALT 8 10 11 9 13   ALKPHOS 96 114 101 104 88  BILITOT 0.3 0.9 0.9 0.4 0.5  PROT 6.2* 6.8 7.2 7.1 6.2*  ALBUMIN 2.1* 2.3* 2.7* 2.6* 2.5*   Coagulation Profile: No results for input(s): INR, PROTIME in the last 168 hours. HbA1C: No results for input(s): HGBA1C in the last 72 hours. CBG: Recent Labs  Lab 04/27/19 0912 04/28/19 0742  GLUCAP 224* 210*    Recent Results (from the past 240 hour(s))  Blood Culture (routine x 2)     Status: None   Collection Time: 04/25/19 12:30 AM   Specimen: BLOOD LEFT HAND  Result Value Ref Range Status   Specimen Description   Final    BLOOD LEFT HAND Performed at [facility] [address] [place], [place] [address]    Special Requests   Final    BOTTLES  DRAWN AEROBIC ONLY Blood Culture adequate volume Performed at [facility] [address], [place], [place] [address]    Culture   Final    NO GROWTH 5 DAYS Performed at [facility], [place] [address], [place], [place] [address]    Report Status 04/30/2019 FINAL  Final  Blood Culture (routine x 2)     Status: None   Collection Time: 04/25/19  1:30 AM   Specimen: BLOOD  Result Value Ref Range Status   Specimen Description   Final    BLOOD RIGHT ANTECUBITAL Performed at [facility] [address] [place],  [place] [address]    Special Requests   Final    BOTTLES DRAWN AEROBIC AND ANAEROBIC Blood Culture adequate volume Performed at [facility] [address] [place], [place] [address]    Culture   Final    NO GROWTH 5 DAYS Performed at [facility], [place] [address], [place], [place] [address]    Report Status 04/30/2019 FINAL  Final  SARS CORONAVIRUS 2 (TAT 6-24 HRS) Nasopharyngeal Nasopharyngeal Swab     Status: Abnormal   Collection Time: 04/25/19  2:25 AM   Specimen: Nasopharyngeal Swab  Result Value Ref Range Status   SARS Coronavirus 2 POSITIVE (A) NEGATIVE Final    Comment: RESULT CALLED TO, READ BACK BY AND VERIFIED WITH: CALLED 3X NO ANSWER [facility] ED [phone], ALSO NO ANSWER THROUGH OPERATOR. (NOTE) SARS-CoV-2 target nucleic acids are DETECTED. The SARS-CoV-2 RNA is generally detectable in upper and lower respiratory specimens during the acute phase of infection. Positive results are indicative of active infection with SARS-CoV-2. Clinical  correlation with patient history and other diagnostic information is necessary to determine patient infection status. Positive results do  not rule out bacterial infection or co-infection with other viruses. The expected result is Negative. Fact Sheet for Patients: SugarRoll.be Fact Sheet for Healthcare Providers:  https://www.woods-mathews.com/ This test is not yet approved or cleared by the [place] FDA and  has been authorized for detection and/or diagnosis of SARS-CoV-2 by FDA under an Emergency Use Authorization (EUA). This EUA will remain  in effect (mea ning this test can be used) for the duration of the COVID-19 declaration under Section 564(b)(1) of the Act, 21 U.S.C. section 360bbb-3(b)(1), unless the authorization is terminated or revoked sooner. Performed at [facility], [place] [address] [place], [place] [address]      Radiology Studies: Dg Chest Port 1 View  Result Date: 04/29/2019 CLINICAL DATA:  Hypoxemia EXAM: PORTABLE CHEST 1 VIEW COMPARISON:  Chest radiograph 04/26/2019 FINDINGS: A right-sided PICC line is now present with tip terminating near the region of the cavoatrial junction. Shallow inspiration radiograph with crowding of the central bronchovascular markings. Cardiomediastinal silhouette unchanged. A left basilar opacity has increased and may reflect atelectasis, pneumonia and/or effusion. Bilateral interstitial and airspace opacities are otherwise similar to prior examination. No evidence of pneumothorax. No acute bony abnormality. Partially visualized ACDF hardware. Overlying cardiac monitoring leads. IMPRESSION: Increasing left basilar opacity which may reflect atelectasis, pneumonia and/or effusion. Bilateral interstitial and airspace opacities are otherwise unchanged and consistent with multifocal pneumonia, possibly with superimposed edema. Electronically Signed   By: Kellie Simmering DO   On: 04/29/2019 15:15    Marzetta Board, MD, PhD [facility]  Between 7 am - 7 pm I am available, please contact me via Amion or Securechat  Between 7 pm - 7 am I am not available, please contact night coverage MD/APP via Amion

## 2019-05-01 LAB — CBC
HCT: 43.1 % (ref 36.0–46.0)
Hemoglobin: 13.9 g/dL (ref 12.0–15.0)
MCH: 30.6 pg (ref 26.0–34.0)
MCHC: 32.3 g/dL (ref 30.0–36.0)
MCV: 94.9 fL (ref 80.0–100.0)
Platelets: 497 10*3/uL — ABNORMAL HIGH (ref 150–400)
RBC: 4.54 MIL/uL (ref 3.87–5.11)
RDW: 14.6 % (ref 11.5–15.5)
WBC: 14.6 10*3/uL — ABNORMAL HIGH (ref 4.0–10.5)
nRBC: 0.1 % (ref 0.0–0.2)

## 2019-05-01 LAB — COMPREHENSIVE METABOLIC PANEL
ALT: 18 U/L (ref 0–44)
AST: 33 U/L (ref 15–41)
Albumin: 2.9 g/dL — ABNORMAL LOW (ref 3.5–5.0)
Alkaline Phosphatase: 99 U/L (ref 38–126)
Anion gap: 12 (ref 5–15)
BUN: 21 mg/dL (ref 8–23)
CO2: 29 mmol/L (ref 22–32)
Calcium: 8.9 mg/dL (ref 8.9–10.3)
Chloride: 95 mmol/L — ABNORMAL LOW (ref 98–111)
Creatinine, Ser: 0.69 mg/dL (ref 0.44–1.00)
GFR calc Af Amer: >60 mL/min (ref >60)
GFR calc non Af Amer: >60 mL/min (ref >60)
Glucose, Bld: 452 mg/dL — ABNORMAL HIGH (ref 70–99)
Potassium: 3.4 mmol/L — ABNORMAL LOW (ref 3.5–5.1)
Sodium: 136 mmol/L (ref 135–145)
Total Bilirubin: 1 mg/dL (ref 0.3–1.2)
Total Protein: 6.4 g/dL — ABNORMAL LOW (ref 6.5–8.1)

## 2019-05-01 LAB — GLUCOSE, CAPILLARY
Glucose-Capillary: 488 mg/dL — ABNORMAL HIGH (ref 70–99)
Glucose-Capillary: 307 mg/dL — ABNORMAL HIGH (ref 70–99)

## 2019-05-01 MED ORDER — POTASSIUM CHLORIDE 20 MEQ/15ML (10%) PO SOLN
40.0000 meq | Freq: Once | ORAL | Status: AC
  Administered 2019-05-01: 40 meq via ORAL
  Filled 2019-05-01: qty 30

## 2019-05-01 MED ORDER — INSULIN ASPART 100 UNIT/ML ~~LOC~~ SOLN
0.0000 [IU] | Freq: Every day | SUBCUTANEOUS | Status: DC
  Administered 2019-05-01: 22:00:00 2 [IU] via SUBCUTANEOUS
  Administered 2019-05-03: 5 [IU] via SUBCUTANEOUS
  Administered 2019-05-04: 2 [IU] via SUBCUTANEOUS
  Administered 2019-05-05: 3 [IU] via SUBCUTANEOUS
  Administered 2019-05-05: 2 [IU] via SUBCUTANEOUS
  Administered 2019-05-06: 3 [IU] via SUBCUTANEOUS

## 2019-05-01 MED ORDER — INSULIN ASPART 100 UNIT/ML ~~LOC~~ SOLN
0.0000 [IU] | Freq: Three times a day (TID) | SUBCUTANEOUS | Status: DC
  Administered 2019-05-01: 20 [IU] via SUBCUTANEOUS
  Administered 2019-05-01 – 2019-05-02 (×3): 15 [IU] via SUBCUTANEOUS
  Administered 2019-05-02 – 2019-05-03 (×2): 20 [IU] via SUBCUTANEOUS
  Administered 2019-05-03: 15 [IU] via SUBCUTANEOUS
  Administered 2019-05-03 – 2019-05-04 (×2): 11 [IU] via SUBCUTANEOUS
  Administered 2019-05-04: 7 [IU] via SUBCUTANEOUS
  Administered 2019-05-04: 4 [IU] via SUBCUTANEOUS
  Administered 2019-05-05: 7 [IU] via SUBCUTANEOUS
  Administered 2019-05-05 – 2019-05-06 (×2): 4 [IU] via SUBCUTANEOUS
  Administered 2019-05-06: 20 [IU] via SUBCUTANEOUS
  Administered 2019-05-06: 15 [IU] via SUBCUTANEOUS
  Administered 2019-05-07: 7 [IU] via SUBCUTANEOUS
  Administered 2019-05-07 – 2019-05-08 (×2): 4 [IU] via SUBCUTANEOUS
  Administered 2019-05-11 – 2019-05-14 (×5): 3 [IU] via SUBCUTANEOUS
  Administered 2019-05-14 – 2019-05-15 (×2): 4 [IU] via SUBCUTANEOUS
  Administered 2019-05-15 – 2019-05-16 (×3): 3 [IU] via SUBCUTANEOUS
  Administered 2019-05-16: 7 [IU] via SUBCUTANEOUS
  Administered 2019-05-16: 1 [IU] via SUBCUTANEOUS
  Administered 2019-05-16: 3 [IU] via SUBCUTANEOUS
  Administered 2019-05-17 – 2019-05-18 (×5): 7 [IU] via SUBCUTANEOUS
  Administered 2019-05-18: 15 [IU] via SUBCUTANEOUS
  Administered 2019-05-19: 11 [IU] via SUBCUTANEOUS
  Administered 2019-05-19: 7 [IU] via SUBCUTANEOUS
  Administered 2019-05-19 – 2019-05-20 (×3): 4 [IU] via SUBCUTANEOUS
  Administered 2019-05-20: 3 [IU] via SUBCUTANEOUS
  Administered 2019-05-21: 7 [IU] via SUBCUTANEOUS
  Administered 2019-05-21: 3 [IU] via SUBCUTANEOUS
  Administered 2019-05-22 – 2019-05-23 (×2): 4 [IU] via SUBCUTANEOUS
  Administered 2019-05-23: 3 [IU] via SUBCUTANEOUS
  Administered 2019-05-24: 4 [IU] via SUBCUTANEOUS
  Administered 2019-05-24: 11 [IU] via SUBCUTANEOUS
  Administered 2019-05-25: 4 [IU] via SUBCUTANEOUS
  Administered 2019-05-26 – 2019-05-28 (×6): 3 [IU] via SUBCUTANEOUS

## 2019-05-01 NOTE — Progress Notes (Signed)
  Speech Language Pathology Treatment: Dysphagia  Patient Details Name: [name] MRN: [id number] DOB: 28-Dec-1940 Today's Date: 05/01/2019 Time: [id number] SLP Time Calculation (min) (ACUTE ONLY): 14 min  Assessment / Plan / Recommendation Clinical Impression  Pt reports improving appetite. Willing to attempt solids, but does admit her dentition is not good. She nibbled on a cracker, needed a liquid wash. Will upgrade to dys 2 and follow for tolerance. No difficulty with liquids.   HPI HPI: Pt is a 78 year old female admitted from SNF with hypoxic respiratory failure after testing positive for COVID-19 on 11/16. PMH includes: HTN, TIA, anxiety, depression, GERD      SLP Plan  Continue with current plan of care       Recommendations  Diet recommendations: Dysphagia 2 (fine chop);Thin liquid Liquids provided via: Cup;Straw Medication Administration: Whole meds with puree Supervision: Patient able to self feed Compensations: Minimize environmental distractions;Slow rate;Small sips/bites Postural Changes and/or Swallow Maneuvers: Seated upright 90 degrees                Oral Care Recommendations: Oral care BID SLP Visit Diagnosis: Dysphagia, unspecified (R13.10) Plan: Continue with current plan of care       GO               Herbie [place], MA [place] Pager [phone] Office [phone]   Lynann Beaver 05/01/2019, 10:27 AM

## 2019-05-01 NOTE — Progress Notes (Signed)
Occupational Therapy Evaluation Patient Details Name: [name] MRN: [id number] DOB: 27-Dec-1940 Today's Date: 05/01/2019    History of Present Illness 78 y.o. female past medical history significant for hypertension TIA anxiety depression presents to the hospital from [facility] via EMS for hypoxia she tested positive for COVID-19 on 04/10/2019. pt admitted with acute respiratory failure with hypoxia due to COVID 19 PNA.   Clinical Impression   Pt admitted from [facility]. Pt states she is normally able to walk with her RW and complete her own bathing and dressing. Pt required Mod A with squat pivot transfer to chair and min A with UB ADL and Max to total A with LB ADL. Pt pleasant and cooperative during session. Recommend staff use Stedy +2 A to transfer back to bed. SpO2 remained in 90s on 9 L HFNC. Pt will benefit from rehab at SNF. Will follow acutely.      Follow Up Recommendations  SNF;Supervision/Assistance - 24 hour    Equipment Recommendations  None recommended by OT    Recommendations for Other Services       Precautions / Restrictions Precautions Precautions: Fall Precaution Comments: monitor O2      Mobility Bed Mobility Overal bed mobility: Needs Assistance Bed Mobility: Supine to Sit     Supine to sit: HOB elevated;Mod assist     General bed mobility comments: A to scoot legs off bed  Transfers Overall transfer level: Needs assistance   Transfers: Stand Pivot Transfers Sit to Stand: Mod assist Stand pivot transfers: Mod assist       General transfer comment: Would recommend using Stedy    Balance Overall balance assessment: Needs assistance   Sitting balance-Leahy Scale: Fair       Standing balance-Leahy Scale: Poor Standing balance comment: reliant on external support                           ADL either performed or assessed with clinical judgement   ADL Overall ADL's : Needs assistance/impaired     Grooming: Set  up;Sitting   Upper Body Bathing: Minimal assistance;Sitting   Lower Body Bathing: Moderate assistance;Sit to/from stand   Upper Body Dressing : Minimal assistance;Sitting   Lower Body Dressing: Maximal assistance;Sit to/from stand   Toilet Transfer: Moderate assistance;Squat-pivot   Toileting- Clothing Manipulation and Hygiene: Maximal assistance       Functional mobility during ADLs: Moderate assistance       Vision Baseline Vision/History: Wears glasses       Perception     Praxis      Pertinent Vitals/Pain Pain Assessment: Faces Faces Pain Scale: Hurts a little bit Pain Location: back pain Pain Descriptors / Indicators: Discomfort Pain Intervention(s): Limited activity within patient's tolerance     Hand Dominance Right   Extremity/Trunk Assessment Upper Extremity Assessment Upper Extremity Assessment: Generalized weakness   Lower Extremity Assessment Lower Extremity Assessment: Defer to PT evaluation       Communication     Cognition Arousal/Alertness: Awake/alert Behavior During Therapy: WFL for tasks assessed/performed Overall Cognitive Status: No family/caregiver present to determine baseline cognitive functioning                                 General Comments: most likely at baseline   General Comments   Pt issued word search puzzles    Exercises Exercises: Other exercises Other Exercises Other Exercises: incentive  spirometer x 10 - able to pull 300 ml Other Exercises: flutter valve x 10 Other Exercises: pursed lip breathing x 10   Shoulder Instructions      Home Living Family/patient expects to be discharged to:: Skilled nursing facility                                        Prior Functioning/Environment Level of Independence: Needs assistance  Gait / Transfers Assistance Needed: pt states she was using her RW at the SNF ADL's / Homemaking Assistance Needed: States she did her own bathing and  dressing            OT Problem List: Decreased strength;Decreased activity tolerance;Impaired balance (sitting and/or standing);Decreased safety awareness;Decreased knowledge of use of DME or AE;Cardiopulmonary status limiting activity;Obesity;Pain      OT Treatment/Interventions: Self-care/ADL training;Therapeutic exercise;Neuromuscular education;Energy conservation;DME and/or AE instruction;Therapeutic activities;Cognitive remediation/compensation;Patient/family education;Balance training    OT Goals(Current goals can be found in the care plan section) Acute Rehab OT Goals Patient Stated Goal: to get stronger OT Goal Formulation: With patient Time For Goal Achievement: 05/15/19 Potential to Achieve Goals: Good  OT Frequency: Min 2X/week   Barriers to D/C:            Co-evaluation              AM-PAC OT "6 Clicks" Daily Activity     Outcome Measure Help from another person eating meals?: None Help from another person taking care of personal grooming?: A Little Help from another person toileting, which includes using toliet, bedpan, or urinal?: Total Help from another person bathing (including washing, rinsing, drying)?: A Lot Help from another person to put on and taking off regular upper body clothing?: A Little Help from another person to put on and taking off regular lower body clothing?: Total 6 Click Score: 14   End of Session Equipment Utilized During Treatment: Oxygen(left on 9L [facility]) Nurse Communication: Mobility status;Need for lift equipment(use Stedy)  Activity Tolerance: Patient tolerated treatment well Patient left: in chair;with call bell/phone within reach  OT Visit Diagnosis: Unsteadiness on feet (R26.81);Other abnormalities of gait and mobility (R26.89);Muscle weakness (generalized) (M62.81);Pain Pain - part of body: (back)                Time: [id number] OT Time Calculation (min): 29 min Charges:  OT General Charges $OT Visit: 1 Visit OT  Evaluation $OT Eval Moderate Complexity: 1 Mod OT Treatments $Self Care/Home Management : 8-22 mins  Maurie Boettcher, OT/L   Acute OT Clinical Specialist [place] Pager [phone] Office [phone]   [facility] 05/01/2019, 5:07 PM

## 2019-05-01 NOTE — Plan of Care (Signed)
O2 weaned to 9-10 liters of hiflow [place], continue to improve respiratory wise with less periods of acute desaturation. IS and acupella in use with encouragement. General weakness, up in recliner for 2 hrs, returned to bed d/t increased back pain in sitting position. Repositioning every 2 hours with pt assist. PRN oxycodone 5 mg with good relief. Much attention needed at times but cooperative and understands care provided. Improved respiratory status after scheduled lasix 40 mg IV with moderate diuresing.

## 2019-05-01 NOTE — Plan of Care (Signed)
  Problem: Education: Goal: Knowledge of risk factors and measures for prevention of condition will improve Outcome: Completed/Met   Problem: Education: Goal: Knowledge of General Education information will improve Description: Including pain rating scale, medication(s)/side effects and non-pharmacologic comfort measures Outcome: Completed/Met   Problem: Health Behavior/Discharge Planning: Goal: Ability to manage health-related needs will improve Outcome: Completed/Met   Problem: Clinical Measurements: Goal: Diagnostic test results will improve Outcome: Completed/Met   Problem: Elimination: Goal: Will not experience complications related to bowel motility Outcome: Completed/Met   Problem: Skin Integrity: Goal: Risk for impaired skin integrity will decrease Outcome: Completed/Met

## 2019-05-01 NOTE — TOC Progression Note (Signed)
Transition of Care [facility]) - Progression Note    Patient Details  Name: [name] MRN: [id number] Date of Birth: 09/21/1940  Transition of Care [facility] CM/SW Contact  Dearra Myhand, Francetta Found, LCSW Phone Number: 05/01/2019, 1:34 PM  Clinical Narrative:     Clinical Social Worker continuing to follow patient and family for support and discharge planning needs.  Patient is currently on 15L nasal canula, therefore still not medically stable for discharge.  Patient is a long term resident at [facility] and can return once medically stable.  CSW remains available for support and to facilitate patient discharge needs once medically stable.  Expected Discharge Plan: [place] Barriers to Discharge: Continued Medical Work up  Expected Discharge Plan and Services Expected Discharge Plan: [place] arrangements for the past 2 months: [place]                 DME Arranged: N/A         HH Arranged: NA           Social Determinants of Health (SDOH) Interventions    Readmission Risk Interventions No flowsheet data found.

## 2019-05-01 NOTE — Progress Notes (Signed)
Left a message with son, [name], to call nurse at [facility] for update. Pt maintaining spo2 in low 90's on 15 liters Hifl nasal cannula. Slight exertions with activities such as bath and sometimes repositioning. Will not tolerate lying on right side greater than 3-5 mins. SSI started today, will continue blood glucose monitoring. Diet change per Dr. Renne Crigler due to high concentration of sweets on meal trays.

## 2019-05-01 NOTE — Progress Notes (Signed)
PROGRESS NOTE  [name] [id number] DOB: 1940-11-28 DOA: 04/12/2019 PCP: Virgel Bouquet, MD   LOS: 6 days   Brief Narrative / Interim history: 78 year old female with hypertension, prior TIA, anxiety, depression came into the hospital from [facility] for hypoxic respiratory failure.  She tested positive for COVID-19 on 04/10/2019, and she was found to be hypoxic and was brought to the emergency room requiring 5 L nasal cannula.  Chest x-ray showed multifocal pneumonia  Subjective / 24h Interval events: Feels better, no significant shortness of breath.  Denies any chest pain, denies any nausea or vomiting, no abdominal pain  Assessment & Plan: Principal Problem Acute hypoxic respiratory failure with hypoxia due to COVID-19 pneumonia -Still hypoxic requiring 15 L nasal cannula this morning. -On remdesivir and steroids, finished remdesivir on 11/25 -Received Actemra on 11/23 -Started on Lasix 11/25 for elevated BNP and evidence of pulmonary edema on the chest x-ray, continue.  Renal function is stable.  Active Problems Acute kidney injury -Creatinine has normalized with fluids, now on Lasix, creatinine stable, continue to monitor  Hyperkalemia -Requires repletion this morning, monitor.  Due to Lasix  Acute metabolic encephalopathy -Due to inflammatory reaction and hypoxia, try to avoid benzos, okay with Haldol  Hypertension -Continue home metoprolol  Type 2 diabetes mellitus with hyperglycemia in the setting of steroids -Most recent hemoglobin A1c is 6.3 showing good control -Glucose this morning on labs was 452.  Add resistant sliding scale.  CBG (last 3)  No results for input(s): GLUCAP in the last 72 hours.  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 amLODipine  10 mg Oral QHS  . aspirin EC  325 mg Oral Daily  . atorvastatin  40 mg Oral q1800  . Chlorhexidine Gluconate Cloth  6 each Topical Daily  . cholecalciferol  1,000 Units Oral Daily  . dexamethasone (DECADRON) injection  6 mg Intravenous Q12H  . enoxaparin (LOVENOX) injection  0.5 mg/kg Subcutaneous Q24H  . furosemide  40 mg Intravenous Q12H  . gabapentin  600 mg Oral TID WC & HS  . Ipratropium-Albuterol  1 puff Inhalation Q6H  . mouth rinse  15 mL Mouth Rinse BID  . metoprolol succinate  100 mg Oral Daily  . metoprolol succinate  50 mg Oral Daily  . pantoprazole  40 mg Oral Daily  . pneumococcal 23 valent vaccine  0.5 mL Intramuscular Tomorrow-1000  . potassium chloride  40 mEq Oral Once  . pyridOXINE  50 mg Oral Daily  . sertraline  50 mg Oral Daily  . sodium chloride flush  10-40 mL Intracatheter Q12H  . traZODone  50 mg Oral QHS  . vitamin C  500 mg Oral Daily  . zinc sulfate  220 mg Oral Daily   Continuous Infusions: PRN Meds:.acetaminophen, albuterol, chlorpheniramine-HYDROcodone, cyclobenzaprine, guaiFENesin-dextromethorphan, haloperidol lactate, metoprolol tartrate, morphine injection, ondansetron (ZOFRAN) IV, oxyCODONE, sodium chloride, sodium chloride flush, SUMAtriptan  DVT prophylaxis: Lovenox Code Status: Full code Family Communication: d/w Francene Boyers [facility]), [phone], [phone] on 11/26 Disposition Plan: home when stable Consultants: None   Procedures:  None   Microbiology  None   Antimicrobials: None     Objective: Vitals:   04/30/19 2021 05/01/19 0400 05/01/19 0447 05/01/19 0742  BP: (!) 146/61 (!) 149/76 (!) 155/78 (!) 141/76  Pulse: 98 80 67 100  Resp: 20 (!) 21 20 20   Temp: 98.5 F (36.9 C) 97.6 F (36.4 C) 98.7 F (37.1 C) 98.5 F (36.9 C)  TempSrc: Oral Oral Oral Oral  SpO2: 99% 100% 90% 97%  Weight:      Height:        Intake/Output Summary  (Last 24 hours) at 05/01/2019 1001 Last data filed at 05/01/2019 0900 Gross per 24 hour  Intake 600 ml  Output 2130 ml  Net -1530 ml   Filed Weights   04/29/2019 2200  Weight: 102.1 kg    Examination:  Constitutional: NAD Eyes: No scleral icterus ENMT: Moist mucous membranes Neck: normal, supple Respiratory: Diminished at the bases, no wheezing, no crackles Cardiovascular: Regular rate and rhythm, no murmurs.  No edema Abdomen: soft, nondistended, bowel sounds positive Musculoskeletal: no clubbing / cyanosis.  Skin: No rashes seen Neurologic: non focal   Data Reviewed: I have independently reviewed following labs and imaging studies   CBC: Recent Labs  Lab 04/25/19 0030  04/28/19 1529 04/29/19 0458 04/29/19 0503 04/30/19 0725 05/01/19 0904  WBC 8.4  --  9.2  --  9.2 11.2* 14.6*  NEUTROABS 5.5  --  7.5  --  7.4 9.4*  --   HGB 11.9*   < > 13.0 14.3 13.2 12.9 13.9  HCT 38.4   < > 41.5 42.0 42.0 40.6 43.1  MCV 100.3*  --  96.7  --  96.6 95.5 94.9  PLT 318  --  467*  --  428* 420* 497*   < > = values in this interval not displayed.   Basic Metabolic Panel: Recent Labs  Lab 04/26/19 0043  04/28/19 1529 04/29/19 0458 04/29/19 0503 04/30/19 0725 05/01/19 0904  NA 140   < > 137 139 141 139 136  K 5.8*   < > 3.7 4.2 3.9 3.5 3.4*  CL 109  --  103  --  104 99 95*  CO2 13*  --  22  --  28 31 29   GLUCOSE 172*  --  237*  --  246* 318* 452*  BUN 18  --  16  --  17 20 21   CREATININE 0.80  --  0.57  --  0.62 0.62 0.69  CALCIUM 9.1  --  9.1  --  9.7 9.1 8.9   < > = values in this interval not displayed.   Liver Function Tests: Recent Labs  Lab 04/26/19 0043 04/28/19 1529 04/29/19 0503 04/30/19 0725 05/01/19 0904  AST 33 34 26 31 33  ALT 10 11 9 13 18   ALKPHOS 114 101 104 88 99  BILITOT 0.9 0.9 0.4 0.5 1.0  PROT 6.8 7.2 7.1 6.2* 6.4*  ALBUMIN 2.3* 2.7* 2.6* 2.5* 2.9*   Coagulation Profile: No results for input(s): INR, PROTIME in the last 168 hours. HbA1C:  No results for input(s): HGBA1C in the last 72 hours. CBG: Recent Labs  Lab 04/27/19 0912 04/28/19 0742  GLUCAP 224* 210*    Recent Results (from the past 240 hour(s))  Blood Culture (routine x 2)     Status: None   Collection Time: 04/25/19 12:30 AM   Specimen: BLOOD LEFT HAND  Result Value Ref Range Status   Specimen Description   Final    BLOOD LEFT HAND Performed at [facility] [address] [place], [place] [address]    Special Requests   Final    BOTTLES DRAWN AEROBIC ONLY Blood Culture adequate volume  Performed at [facility], [place] [address], [place], [place] [address]    Culture   Final    NO GROWTH 5 DAYS Performed at [facility], [place] [address], [place], [place] [address]    Report Status 04/30/2019 FINAL  Final  Blood Culture (routine x 2)     Status: None   Collection Time: 04/25/19  1:30 AM   Specimen: BLOOD  Result Value Ref Range Status   Specimen Description   Final    BLOOD RIGHT ANTECUBITAL Performed at [facility] [address], [place], [place] [address]    Special Requests   Final    BOTTLES DRAWN AEROBIC AND ANAEROBIC Blood Culture adequate volume Performed at [facility] [address] [place], [place] [address]    Culture   Final    NO GROWTH 5 DAYS Performed at [facility], [place] [address] [place], [place] [address]    Report Status 04/30/2019 FINAL  Final  SARS CORONAVIRUS 2 (TAT 6-24 HRS) Nasopharyngeal Nasopharyngeal Swab     Status: Abnormal   Collection Time: 04/25/19  2:25 AM   Specimen: Nasopharyngeal Swab  Result Value Ref Range Status   SARS Coronavirus 2 POSITIVE (A) NEGATIVE Final    Comment: RESULT CALLED TO, READ BACK BY AND VERIFIED WITH: CALLED 3X NO ANSWER [facility] ED [phone], ALSO NO ANSWER THROUGH OPERATOR. (NOTE) SARS-CoV-2 target nucleic acids are DETECTED. The SARS-CoV-2 RNA is generally detectable in upper and lower  respiratory specimens during the acute phase of infection. Positive results are indicative of active infection with SARS-CoV-2. Clinical  correlation with patient history and other diagnostic information is necessary to determine patient infection status. Positive results do  not rule out bacterial infection or co-infection with other viruses. The expected result is Negative. Fact Sheet for Patients: SugarRoll.be Fact Sheet for Healthcare Providers: https://www.woods-mathews.com/ This test is not yet approved or cleared by the [place] FDA and  has been authorized for detection and/or diagnosis of SARS-CoV-2 by FDA under an Emergency Use Authorization (EUA). This EUA will remain  in effect (mea ning this test can be used) for the duration of the COVID-19 declaration under Section 564(b)(1) of the Act, 21 U.S.C. section 360bbb-3(b)(1), unless the authorization is terminated or revoked sooner. Performed at [facility], [place] [address] [place],  [address]      Radiology Studies: No results found.  Marzetta Board, MD, PhD [facility]  Between 7 am - 7 pm I am available, please contact me via Amion or Securechat  Between 7 pm - 7 am I am not available, please contact night coverage MD/APP via Amion

## 2019-05-02 LAB — CBC
HCT: 43.5 % (ref 36.0–46.0)
Hemoglobin: 14 g/dL (ref 12.0–15.0)
MCH: 30.6 pg (ref 26.0–34.0)
MCHC: 32.2 g/dL (ref 30.0–36.0)
MCV: 95 fL (ref 80.0–100.0)
Platelets: 427 10*3/uL — ABNORMAL HIGH (ref 150–400)
RBC: 4.58 MIL/uL (ref 3.87–5.11)
RDW: 14.7 % (ref 11.5–15.5)
WBC: 16.7 10*3/uL — ABNORMAL HIGH (ref 4.0–10.5)
nRBC: 0.1 % (ref 0.0–0.2)

## 2019-05-02 LAB — BASIC METABOLIC PANEL
Anion gap: 12 (ref 5–15)
BUN: 24 mg/dL — ABNORMAL HIGH (ref 8–23)
CO2: 32 mmol/L (ref 22–32)
Calcium: 8.6 mg/dL — ABNORMAL LOW (ref 8.9–10.3)
Chloride: 90 mmol/L — ABNORMAL LOW (ref 98–111)
Creatinine, Ser: 0.66 mg/dL (ref 0.44–1.00)
GFR calc Af Amer: >60 mL/min (ref >60)
GFR calc non Af Amer: >60 mL/min (ref >60)
Glucose, Bld: 426 mg/dL — ABNORMAL HIGH (ref 70–99)
Potassium: 4.1 mmol/L (ref 3.5–5.1)
Sodium: 134 mmol/L — ABNORMAL LOW (ref 135–145)

## 2019-05-02 LAB — GLUCOSE, CAPILLARY
Glucose-Capillary: 376 mg/dL — ABNORMAL HIGH (ref 70–99)
Glucose-Capillary: 312 mg/dL — ABNORMAL HIGH (ref 70–99)
Glucose-Capillary: 322 mg/dL — ABNORMAL HIGH (ref 70–99)
Glucose-Capillary: 172 mg/dL — ABNORMAL HIGH (ref 70–99)

## 2019-05-02 MED ORDER — INSULIN DETEMIR 100 UNIT/ML ~~LOC~~ SOLN
8.0000 [IU] | Freq: Two times a day (BID) | SUBCUTANEOUS | Status: DC
  Administered 2019-05-02 – 2019-05-03 (×3): 8 [IU] via SUBCUTANEOUS
  Filled 2019-05-02 (×3): qty 0.08

## 2019-05-02 NOTE — Plan of Care (Signed)
  Problem: Coping: Goal: Psychosocial and spiritual needs will be supported Outcome: Progressing   Problem: Respiratory: Goal: Will maintain a patent airway Outcome: Progressing Goal: Complications related to the disease process, condition or treatment will be avoided or minimized Outcome: Progressing   Problem: Clinical Measurements: Goal: Ability to maintain clinical measurements within normal limits will improve Outcome: Progressing Goal: Will remain free from infection Outcome: Progressing Goal: Respiratory complications will improve Outcome: Progressing Goal: Cardiovascular complication will be avoided Outcome: Progressing   Problem: Activity: Goal: Risk for activity intolerance will decrease Outcome: Progressing   Problem: Nutrition: Goal: Adequate nutrition will be maintained Outcome: Progressing   Problem: Coping: Goal: Level of anxiety will decrease Outcome: Progressing   Problem: Elimination: Goal: Will not experience complications related to urinary retention Outcome: Progressing   Problem: Pain Managment: Goal: General experience of comfort will improve Outcome: Progressing   Problem: Safety: Goal: Ability to remain free from injury will improve Outcome: Progressing

## 2019-05-02 NOTE — Progress Notes (Signed)
PROGRESS NOTE  [name] [id number] DOB: [birth year] DOA: 04/28/2019 PCP: Virgel Bouquet, MD   LOS: 7 days   Brief Narrative / Interim history: 78 year old female with hypertension, prior TIA, anxiety, depression came into the hospital from [facility] for hypoxic respiratory failure.  She tested positive for COVID-19 on 04/10/2019, and she was found to be hypoxic and was brought to the emergency room requiring 5 L nasal cannula.  Chest x-ray showed multifocal pneumonia  Subjective / 24h Interval events: Feels better, more alert.  Oxygenation improved  Assessment & Plan: Principal Problem Acute hypoxic respiratory failure with hypoxia due to COVID-19 pneumonia -Still hypoxic but oxygenation improved from 15 L to 9 L.  Was able to work with physical therapy yesterday -On remdesivir and steroids, finished remdesivir on 11/25 -Received Actemra on 11/23 -Started on Lasix 11/25 for elevated BNP and evidence of pulmonary edema on the chest x-ray, continue Lasix today.  Renal function remains stable  Active Problems Acute kidney injury -Initially received fluids and creatinine has normalized, now on Lasix and renal function is remaining stable.  Hyperkalemia -Monitor daily and replete if necessary  Acute metabolic encephalopathy -Due to inflammatory reaction and hypoxia, try to avoid benzos, okay with Haldol  Hypertension -Continue home metoprolol  Type 2 diabetes mellitus with hyperglycemia in the setting of steroids -Most recent hemoglobin A1c is 6.3 showing good control -Resistant sliding scale not enough, and she has persistent CBG elevation, add Levemir today at 8 units twice daily, start low and uptitrate if indicated.  Anticipate insulin needs to go down once we wean off steroids   CBG (last 3)  Recent Labs    05/01/19 1107 05/01/19 1618 05/02/19 0735  GLUCAP 488* 307* 376*    Hyperlipidemia -Continue statin  Depression/anxiety -Continue Zoloft  Recurrent  herniated disc herniation L2-L3 status post microdiscectomy 2007 -Part of agitation is back pain, responds well to IV morphine, continue  History of narcotics use -Avoid excessive narcotics but keep her comfortable   Scheduled Meds: . amLODipine  10 mg Oral QHS  . aspirin EC  325 mg Oral Daily  . atorvastatin  40 mg Oral q1800  . Chlorhexidine Gluconate Cloth  6 each Topical Daily  . cholecalciferol  1,000 Units Oral Daily  . dexamethasone (DECADRON) injection  6 mg Intravenous Q12H  . enoxaparin (LOVENOX) injection  0.5 mg/kg Subcutaneous Q24H  . furosemide  40 mg Intravenous Q12H  . gabapentin  600 mg Oral TID WC & HS  . insulin aspart  0-20 Units Subcutaneous TID WC  . insulin aspart  0-5 Units Subcutaneous QHS  . Ipratropium-Albuterol  1 puff Inhalation Q6H  . mouth rinse  15 mL Mouth Rinse BID  . metoprolol succinate  100 mg Oral Daily  . metoprolol succinate  50 mg Oral Daily  . pantoprazole  40 mg Oral Daily  . pneumococcal 23 valent vaccine  0.5 mL Intramuscular Tomorrow-1000  . pyridOXINE  50 mg Oral Daily  . sertraline  50 mg Oral Daily  . sodium chloride flush  10-40 mL Intracatheter Q12H  . traZODone  50 mg Oral QHS  . vitamin C  500 mg Oral Daily  . zinc sulfate  220 mg Oral Daily   Continuous Infusions: PRN Meds:.acetaminophen, albuterol, chlorpheniramine-HYDROcodone, cyclobenzaprine, guaiFENesin-dextromethorphan, haloperidol lactate, metoprolol tartrate, morphine injection, ondansetron (ZOFRAN) IV, oxyCODONE, sodium chloride, sodium chloride flush, SUMAtriptan  DVT prophylaxis: Lovenox Code Status: Full code Family Communication: d/w Francene Boyers [facility]), [phone], [phone] on 11/28 Disposition Plan: home when  stable Consultants: None   Procedures:  None   Microbiology  None   Antimicrobials: None     Objective: Vitals:   05/02/19 0400 05/02/19 0500 05/02/19 0600 05/02/19 0742  BP: (!) 156/88   (!) 145/77  Pulse: 93 92 98 85  Resp: 16  20 (!) 21 14  Temp: 97.7 F (36.5 C)   98.2 F (36.8 C)  TempSrc: Oral   Oral  SpO2: 97% 93% 91% 99%  Weight:   101.4 kg   Height:        Intake/Output Summary (Last 24 hours) at 05/02/2019 1117 Last data filed at 05/02/2019 0600 Gross per 24 hour  Intake 1580 ml  Output 2525 ml  Net -945 ml   Filed Weights   04/06/2019 2200 05/02/19 0600  Weight: 102.1 kg 101.4 kg    Examination:  Constitutional: No distress, in bed Eyes: No scleral icterus ENMT: Moist mucous membranes Neck: normal, supple Respiratory: Diminished at the bases, no wheezing, no crackles, moves air well Cardiovascular: Regular rate and rhythm, no murmurs, trace edema Abdomen: Soft, nondistended, nontender, positive bowel sounds Musculoskeletal: no clubbing / cyanosis.  Skin: No rashes seen Neurologic: non focal   Data Reviewed: I have independently reviewed following labs and imaging studies   CBC: Recent Labs  Lab 04/28/19 1529 04/29/19 0458 04/29/19 0503 04/30/19 0725 05/01/19 0904 05/02/19 0644  WBC 9.2  --  9.2 11.2* 14.6* 16.7*  NEUTROABS 7.5  --  7.4 9.4*  --   --   HGB 13.0 14.3 13.2 12.9 13.9 14.0  HCT 41.5 42.0 42.0 40.6 43.1 43.5  MCV 96.7  --  96.6 95.5 94.9 95.0  PLT 467*  --  428* 420* 497* [id number]*   Basic Metabolic Panel: Recent Labs  Lab 04/28/19 1529 04/29/19 0458 04/29/19 0503 04/30/19 0725 05/01/19 0904 05/02/19 0644  NA 137 139 141 139 136 134*  K 3.7 4.2 3.9 3.5 3.4* 4.1  CL 103  --  104 99 95* 90*  CO2 22  --  28 31 29  32  GLUCOSE 237*  --  246* 318* 452* 426*  BUN 16  --  17 20 21  24*  CREATININE 0.57  --  0.62 0.62 0.69 0.66  CALCIUM 9.1  --  9.7 9.1 8.9 8.6*   Liver Function Tests: Recent Labs  Lab 04/26/19 0043 04/28/19 1529 04/29/19 0503 04/30/19 0725 05/01/19 0904  AST 33 34 26 31 33  ALT 10 11 9 13 18   ALKPHOS 114 101 104 88 99  BILITOT 0.9 0.9 0.4 0.5 1.0  PROT 6.8 7.2 7.1 6.2* 6.4*  ALBUMIN 2.3* 2.7* 2.6* 2.5* 2.9*   Coagulation Profile: No  results for input(s): INR, PROTIME in the last 168 hours. HbA1C: No results for input(s): HGBA1C in the last 72 hours. CBG: Recent Labs  Lab 04/27/19 0912 04/28/19 0742 05/01/19 1107 05/01/19 1618 05/02/19 0735  GLUCAP 224* 210* 488* 307* 376*    Recent Results (from the past 240 hour(s))  Blood Culture (routine x 2)     Status: None   Collection Time: 04/25/19 12:30 AM   Specimen: BLOOD LEFT HAND  Result Value Ref Range Status   Specimen Description   Final    BLOOD LEFT HAND Performed at [facility] [address] [place], [place] [address]    Special Requests   Final    BOTTLES DRAWN AEROBIC ONLY Blood Culture adequate volume Performed at [facility] [address] [place], [place]  [address]    Culture   Final    NO GROWTH 5 DAYS Performed at [facility], [place] [address] [place], [place] [address]    Report Status 04/30/2019 FINAL  Final  Blood Culture (routine x 2)     Status: None   Collection Time: 04/25/19  1:30 AM   Specimen: BLOOD  Result Value Ref Range Status   Specimen Description   Final    BLOOD RIGHT ANTECUBITAL Performed at [facility] [address], [place], [place] [address]    Special Requests   Final    BOTTLES DRAWN AEROBIC AND ANAEROBIC Blood Culture adequate volume Performed at [facility] [address] [place], [place] [address]    Culture   Final    NO GROWTH 5 DAYS Performed at [facility], [place] [address], [place], [place] [address]    Report Status 04/30/2019 FINAL  Final  SARS CORONAVIRUS 2 (TAT 6-24 HRS) Nasopharyngeal Nasopharyngeal Swab     Status: Abnormal   Collection Time: 04/25/19  2:25 AM   Specimen: Nasopharyngeal Swab  Result Value Ref Range Status   SARS Coronavirus 2 POSITIVE (A) NEGATIVE Final    Comment: RESULT CALLED TO, READ BACK BY AND VERIFIED WITH: CALLED 3X NO ANSWER [facility] ED [phone], ALSO NO ANSWER THROUGH  OPERATOR. (NOTE) SARS-CoV-2 target nucleic acids are DETECTED. The SARS-CoV-2 RNA is generally detectable in upper and lower respiratory specimens during the acute phase of infection. Positive results are indicative of active infection with SARS-CoV-2. Clinical  correlation with patient history and other diagnostic information is necessary to determine patient infection status. Positive results do  not rule out bacterial infection or co-infection with other viruses. The expected result is Negative. Fact Sheet for Patients: SugarRoll.be Fact Sheet for Healthcare Providers: https://www.woods-mathews.com/ This test is not yet approved or cleared by the [place] FDA and  has been authorized for detection and/or diagnosis of SARS-CoV-2 by FDA under an Emergency Use Authorization (EUA). This EUA will remain  in effect (mea ning this test can be used) for the duration of the COVID-19 declaration under Section 564(b)(1) of the Act, 21 U.S.C. section 360bbb-3(b)(1), unless the authorization is terminated or revoked sooner. Performed at [facility], [place] [address], [place],  [address]      Radiology Studies: No results found.  Marzetta Board, MD, PhD [facility]  Between 7 am - 7 pm I am available, please contact me via Amion or Securechat  Between 7 pm - 7 am I am not available, please contact night coverage MD/APP via Amion

## 2019-05-02 NOTE — Plan of Care (Signed)
POC discussed with patient. A&O, pain managed with PRN meds. On 12 L HFNC 02, weaned as tolerated. Afebrile, BP stable. Good urine output, no BM this shift. Turned left/right q2 to prevent skin breakdown. No other issues noted at this time.

## 2019-05-02 NOTE — Progress Notes (Signed)
Physical Therapy Treatment Patient Details Name: [name] MRN: [id number] DOB: 06/03/1941 Today's Date: 05/02/2019    History of Present Illness 78 y.o. female past medical history significant for hypertension TIA anxiety depression presents to the hospital from [facility] via EMS for hypoxia she tested positive for COVID-19 on 04/10/2019. pt admitted with acute respiratory failure with hypoxia due to COVID 19 PNA.    PT Comments    Patient more alert than when last seen by PT. Able to follow multi-step commands. Remains limited by fatigue (initial sit to stand with min assist; repeat attempts unable to stand even with moderate assist). Patient reported liking using steady for return to bed with nursing yesterday and recommend continued use until able to safely take steps with RW.    Follow Up Recommendations  SNF     Equipment Recommendations  None recommended by PT    Recommendations for Other Services       Precautions / Restrictions Precautions Precautions: Fall Precaution Comments: monitor O2 Restrictions Weight Bearing Restrictions: No    Mobility  Bed Mobility Overal bed mobility: Needs Assistance Bed Mobility: Rolling;Sidelying to Sit;Sit to Supine Rolling: Min assist Sidelying to sit: Min assist;HOB elevated(20 degrees; with rail)   Sit to supine: Min assist      Transfers Overall transfer level: Needs assistance Equipment used: Rolling walker (2 wheeled) Transfers: Sit to/from Stand Sit to Stand: Min assist         General transfer comment: stood from EOB x 1 to RW with min assist, however pt did not feel she could step-pivot to chair with RW and returned to sitting EOB; pt requested to use steady; retrieved steady and attempted to stand x2 with bed elevated and +1 assist and unable; pt became anxious re: needing to have a BM and returned to supine and assisted onto bedpan  Ambulation/Gait             General Gait Details:  unable   Stairs             Wheelchair Mobility    Modified Rankin (Stroke Patients Only)       Balance Overall balance assessment: Needs assistance Sitting-balance support: Single extremity supported;Feet supported Sitting balance-Leahy Scale: Fair     Standing balance support: Bilateral upper extremity supported Standing balance-Leahy Scale: Poor Standing balance comment: reliant on external support                            Cognition Arousal/Alertness: Awake/alert Behavior During Therapy: WFL for tasks assessed/performed Overall Cognitive Status: No family/caregiver present to determine baseline cognitive functioning                                 General Comments: most likely at baseline      Exercises Other Exercises Other Exercises: ankle pumps x 10 prior to sit EOB    General Comments General comments (skin integrity, edema, etc.): VSS on 9L O2 (sats 92-96%)      Pertinent Vitals/Pain Pain Assessment: 0-10 Pain Score: 2  Pain Location: back pain Pain Descriptors / Indicators: Discomfort Pain Intervention(s): Limited activity within patient's tolerance;Monitored during session;Repositioned    Home Living                      Prior Function            PT Goals (  current goals can now be found in the care plan section) Acute Rehab PT Goals Patient Stated Goal: to get stronger Time For Goal Achievement: 05/12/19 Potential to Achieve Goals: Fair Progress towards PT goals: Progressing toward goals    Frequency    Min 2X/week      PT Plan Current plan remains appropriate    Co-evaluation              AM-PAC PT "6 Clicks" Mobility   Outcome Measure  Help needed turning from your back to your side while in a flat bed without using bedrails?: A Little Help needed moving from lying on your back to sitting on the side of a flat bed without using bedrails?: A Little Help needed moving to and from a  bed to a chair (including a wheelchair)?: A Lot Help needed standing up from a chair using your arms (e.g., wheelchair or bedside chair)?: A Lot Help needed to walk in hospital room?: Total Help needed climbing 3-5 steps with a railing? : Total 6 Click Score: 12    End of Session Equipment Utilized During Treatment: Oxygen Activity Tolerance: Patient limited by fatigue Patient left: in bed;with call bell/phone within reach Nurse Communication: Mobility status;Other (comment)(on bedpan) PT Visit Diagnosis: Unsteadiness on feet (R26.81);Muscle weakness (generalized) (M62.81)     Time: [id number] PT Time Calculation (min) (ACUTE ONLY): 24 min  Charges:  $Therapeutic Activity: 23-37 mins                      Barry Brunner, PT Pager [phone]    Rexanne Mano 05/02/2019, 1:11 PM

## 2019-05-03 LAB — BASIC METABOLIC PANEL
Anion gap: 10 (ref 5–15)
BUN: 27 mg/dL — ABNORMAL HIGH (ref 8–23)
CO2: 34 mmol/L — ABNORMAL HIGH (ref 22–32)
Calcium: 8.6 mg/dL — ABNORMAL LOW (ref 8.9–10.3)
Chloride: 87 mmol/L — ABNORMAL LOW (ref 98–111)
Creatinine, Ser: 0.62 mg/dL (ref 0.44–1.00)
GFR calc Af Amer: >60 mL/min (ref >60)
GFR calc non Af Amer: >60 mL/min (ref >60)
Glucose, Bld: 262 mg/dL — ABNORMAL HIGH (ref 70–99)
Potassium: 3.8 mmol/L (ref 3.5–5.1)
Sodium: 131 mmol/L — ABNORMAL LOW (ref 135–145)

## 2019-05-03 LAB — CBC
HCT: 42.9 % (ref 36.0–46.0)
Hemoglobin: 14.3 g/dL (ref 12.0–15.0)
MCH: 31.4 pg (ref 26.0–34.0)
MCHC: 33.3 g/dL (ref 30.0–36.0)
MCV: 94.3 fL (ref 80.0–100.0)
Platelets: 374 10*3/uL (ref 150–400)
RBC: 4.55 MIL/uL (ref 3.87–5.11)
RDW: 14.6 % (ref 11.5–15.5)
WBC: 18.8 10*3/uL — ABNORMAL HIGH (ref 4.0–10.5)
nRBC: 0.1 % (ref 0.0–0.2)

## 2019-05-03 LAB — GLUCOSE, CAPILLARY
Glucose-Capillary: 261 mg/dL — ABNORMAL HIGH (ref 70–99)
Glucose-Capillary: 320 mg/dL — ABNORMAL HIGH (ref 70–99)
Glucose-Capillary: 405 mg/dL — ABNORMAL HIGH (ref 70–99)
Glucose-Capillary: 427 mg/dL — ABNORMAL HIGH (ref 70–99)

## 2019-05-03 MED ORDER — INSULIN DETEMIR 100 UNIT/ML ~~LOC~~ SOLN
10.0000 [IU] | Freq: Two times a day (BID) | SUBCUTANEOUS | Status: DC
  Administered 2019-05-03: 10 [IU] via SUBCUTANEOUS
  Filled 2019-05-03 (×2): qty 0.1

## 2019-05-03 NOTE — Progress Notes (Signed)
PROGRESS NOTE  [name] [id number] DOB: 12/27/1940 DOA: 04/19/2019 PCP: Virgel Bouquet, MD   LOS: 8 days   Brief Narrative / Interim history: 78 year old female with hypertension, prior TIA, anxiety, depression came into the hospital from [facility] for hypoxic respiratory failure.  She tested positive for COVID-19 on 04/10/2019, and she was found to be hypoxic and was brought to the emergency room requiring 5 L nasal cannula.  Chest x-ray showed multifocal pneumonia  Subjective / 24h Interval events: Eating breakfast, no complaints.  States that her breathing is improved.  Assessment & Plan: Principal Problem Acute hypoxic respiratory failure with hypoxia due to COVID-19 pneumonia -Still hypoxic but oxygenation improved from 15 L to 9 L to 5 L today.  Was able to work with physical therapy, recommended SNF -On remdesivir and steroids, finished remdesivir on 11/25, today day 8/10 of steroids -Received Actemra on 11/23 -Started on Lasix 11/25 for elevated BNP and evidence of pulmonary edema on the chest x-ray.  Overall remains net negative and his creatinine is stable.  Bicarb and BUN slightly increasing with decreasing chloride, potentially stop Lasix within 1 to 2 days  Active Problems Acute kidney injury -Initially received fluids and creatinine has normalized, now on Lasix and renal function is remaining stable.  Hypokalemia -Monitor daily and replete if necessary  Hyponatremia -Expected, due to Lasix, mild, monitor  Acute metabolic encephalopathy -Due to inflammatory reaction and hypoxia, try to avoid benzos, okay with Haldol  Hypertension -Continue home metoprolol  Type 2 diabetes mellitus with hyperglycemia in the setting of steroids -Most recent hemoglobin A1c is 6.3 showing good control -Currently on Levemir twice daily and sliding scale, fasting elevated this morning, increase Levemir   CBG (last 3)  Recent Labs    05/02/19 1621 05/02/19 2101 05/03/19  0736  GLUCAP 322* 172* 261*    Hyperlipidemia -Continue statin  Depression/anxiety -Continue Zoloft  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 amLODipine  10 mg Oral QHS  . aspirin EC  325 mg Oral Daily  . atorvastatin  40 mg Oral q1800  . Chlorhexidine Gluconate Cloth  6 each Topical Daily  . cholecalciferol  1,000 Units Oral Daily  . dexamethasone (DECADRON) injection  6 mg Intravenous Q12H  . enoxaparin (LOVENOX) injection  0.5 mg/kg Subcutaneous Q24H  . furosemide  40 mg Intravenous Q12H  . gabapentin  600 mg Oral TID WC & HS  . insulin aspart  0-20 Units Subcutaneous TID WC  . insulin aspart  0-5 Units Subcutaneous QHS  . insulin detemir  8 Units Subcutaneous BID  . Ipratropium-Albuterol  1 puff Inhalation Q6H  . mouth rinse  15 mL Mouth Rinse BID  . metoprolol succinate  100 mg Oral Daily  . metoprolol succinate  50 mg Oral Daily  . pantoprazole  40 mg Oral Daily  . pneumococcal 23 valent vaccine  0.5 mL Intramuscular Tomorrow-1000  . pyridOXINE  50 mg Oral Daily  . sertraline  50 mg Oral Daily  . sodium chloride flush  10-40 mL Intracatheter Q12H  . traZODone  50 mg Oral QHS  . vitamin C  500 mg Oral Daily  . zinc sulfate  220 mg Oral Daily   Continuous Infusions: PRN Meds:.acetaminophen, albuterol, chlorpheniramine-HYDROcodone, cyclobenzaprine, guaiFENesin-dextromethorphan, haloperidol lactate, metoprolol tartrate, morphine injection, ondansetron (ZOFRAN) IV, oxyCODONE, sodium  chloride, sodium chloride flush, SUMAtriptan  DVT prophylaxis: Lovenox Code Status: Full code Family Communication: d/w Francene Boyers [facility]), [phone], [phone]  Disposition Plan: home when stable Consultants: None   Procedures:  None   Microbiology  None    Antimicrobials: None     Objective: Vitals:   05/02/19 2105 05/03/19 0529 05/03/19 0637 05/03/19 0734  BP: (!) 157/70  (!) 142/58 (!) 152/72  Pulse: 83 80 80 78  Resp: 16 19  12   Temp:   97.7 F (36.5 C) 97.8 F (36.6 C)  TempSrc:   Oral Oral  SpO2: 93% 91% 91% 95%  Weight:      Height:        Intake/Output Summary (Last 24 hours) at 05/03/2019 1100 Last data filed at 05/03/2019 1054 Gross per 24 hour  Intake 480 ml  Output 950 ml  Net -470 ml   Filed Weights   04/22/2019 2200 05/02/19 0600  Weight: 102.1 kg 101.4 kg    Examination:  Constitutional: NAD, in bed Eyes: no icterus  ENMT: mmm Neck: normal, supple Respiratory: mostly clear, no wheezing, no crackles. Moves air well  Cardiovascular: RRR, no MRG, no edema  Abdomen: soft, NT, ND, BS+ Musculoskeletal: no clubbing / cyanosis.  Skin: no rashes Neurologic: no focal deficits   Data Reviewed: I have independently reviewed following labs and imaging studies   CBC: Recent Labs  Lab 04/28/19 1529  04/29/19 0503 04/30/19 0725 05/01/19 0904 05/02/19 0644 05/03/19 0543  WBC 9.2  --  9.2 11.2* 14.6* 16.7* 18.8*  NEUTROABS 7.5  --  7.4 9.4*  --   --   --   HGB 13.0   < > 13.2 12.9 13.9 14.0 14.3  HCT 41.5   < > 42.0 40.6 43.1 43.5 42.9  MCV 96.7  --  96.6 95.5 94.9 95.0 94.3  PLT 467*  --  428* 420* 497* 427* 374   < > = values in this interval not displayed.   Basic Metabolic Panel: Recent Labs  Lab 04/29/19 0503 04/30/19 0725 05/01/19 0904 05/02/19 0644 05/03/19 0543  NA 141 139 136 134* 131*  K 3.9 3.5 3.4* 4.1 3.8  CL 104 99 95* 90* 87*  CO2 28 31 29  32 34*  GLUCOSE 246* 318* 452* 426* 262*  BUN 17 20 21  24* 27*  CREATININE 0.62 0.62 0.69 0.66 0.62  CALCIUM 9.7 9.1 8.9 8.6* 8.6*   Liver Function Tests: Recent Labs  Lab 04/28/19 1529 04/29/19 0503 04/30/19 0725 05/01/19 0904  AST 34 26 31 33  ALT 11 9 13 18   ALKPHOS 101 104 88 99  BILITOT 0.9 0.4 0.5 1.0  PROT 7.2 7.1 6.2* 6.4*   ALBUMIN 2.7* 2.6* 2.5* 2.9*   Coagulation Profile: No results for input(s): INR, PROTIME in the last 168 hours. HbA1C: No results for input(s): HGBA1C in the last 72 hours. CBG: Recent Labs  Lab 05/02/19 0735 05/02/19 1129 05/02/19 1621 05/02/19 2101 05/03/19 0736  GLUCAP 376* 312* 322* 172* 261*    Recent Results (from the past 240 hour(s))  Blood Culture (routine x 2)     Status: None   Collection Time: 04/25/19 12:30 AM   Specimen: BLOOD LEFT HAND  Result Value Ref Range Status   Specimen Description   Final    BLOOD LEFT HAND Performed at [facility] [address], [place],  [address]    Special Requests   Final    BOTTLES DRAWN AEROBIC ONLY Blood Culture adequate  volume Performed at [facility], [place] [address] [place] [address]    Culture   Final    NO GROWTH 5 DAYS Performed at [facility], [place] [address] [place], [place] [address]    Report Status 04/30/2019 FINAL  Final  Blood Culture (routine x 2)     Status: None   Collection Time: 04/25/19  1:30 AM   Specimen: BLOOD  Result Value Ref Range Status   Specimen Description   Final    BLOOD RIGHT ANTECUBITAL Performed at [facility] [address] [place], [place] [address]    Special Requests   Final    BOTTLES DRAWN AEROBIC AND ANAEROBIC Blood Culture adequate volume Performed at [facility] [address], [place], [place] [address]    Culture   Final    NO GROWTH 5 DAYS Performed at [facility], [place] [address], [place], [place] [address]    Report Status 04/30/2019 FINAL  Final  SARS CORONAVIRUS 2 (TAT 6-24 HRS) Nasopharyngeal Nasopharyngeal Swab     Status: Abnormal   Collection Time: 04/25/19  2:25 AM   Specimen: Nasopharyngeal Swab  Result Value Ref Range Status   SARS Coronavirus 2 POSITIVE (A) NEGATIVE Final    Comment: RESULT CALLED TO, READ BACK BY AND VERIFIED WITH: CALLED 3X  NO ANSWER [facility] ED [phone], ALSO NO ANSWER THROUGH OPERATOR. (NOTE) SARS-CoV-2 target nucleic acids are DETECTED. The SARS-CoV-2 RNA is generally detectable in upper and lower respiratory specimens during the acute phase of infection. Positive results are indicative of active infection with SARS-CoV-2. Clinical  correlation with patient history and other diagnostic information is necessary to determine patient infection status. Positive results do  not rule out bacterial infection or co-infection with other viruses. The expected result is Negative. Fact Sheet for Patients: SugarRoll.be Fact Sheet for Healthcare Providers: https://www.woods-mathews.com/ This test is not yet approved or cleared by the [place] FDA and  has been authorized for detection and/or diagnosis of SARS-CoV-2 by FDA under an Emergency Use Authorization (EUA). This EUA will remain  in effect (mea ning this test can be used) for the duration of the COVID-19 declaration under Section 564(b)(1) of the Act, 21 U.S.C. section 360bbb-3(b)(1), unless the authorization is terminated or revoked sooner. Performed at [facility], [place] [address], [place], [place] [address]      Radiology Studies: No results found.  Marzetta Board, MD, PhD [facility]  Between 7 am - 7 pm I am available, please contact me via Amion or Securechat  Between 7 pm - 7 am I am not available, please contact night coverage MD/APP via Amion

## 2019-05-04 LAB — GLUCOSE, CAPILLARY
Glucose-Capillary: 200 mg/dL — ABNORMAL HIGH (ref 70–99)
Glucose-Capillary: 238 mg/dL — ABNORMAL HIGH (ref 70–99)
Glucose-Capillary: 275 mg/dL — ABNORMAL HIGH (ref 70–99)

## 2019-05-04 LAB — BASIC METABOLIC PANEL
Anion gap: 11 (ref 5–15)
BUN: 25 mg/dL — ABNORMAL HIGH (ref 8–23)
CO2: 35 mmol/L — ABNORMAL HIGH (ref 22–32)
Calcium: 9 mg/dL (ref 8.9–10.3)
Chloride: 86 mmol/L — ABNORMAL LOW (ref 98–111)
Creatinine, Ser: 0.61 mg/dL (ref 0.44–1.00)
GFR calc Af Amer: >60 mL/min (ref >60)
GFR calc non Af Amer: >60 mL/min (ref >60)
Glucose, Bld: 211 mg/dL — ABNORMAL HIGH (ref 70–99)
Potassium: 4.4 mmol/L (ref 3.5–5.1)
Sodium: 132 mmol/L — ABNORMAL LOW (ref 135–145)

## 2019-05-04 MED ORDER — INSULIN ASPART 100 UNIT/ML ~~LOC~~ SOLN
4.0000 [IU] | Freq: Three times a day (TID) | SUBCUTANEOUS | Status: DC
  Administered 2019-05-04 – 2019-05-07 (×12): 4 [IU] via SUBCUTANEOUS

## 2019-05-04 MED ORDER — INSULIN DETEMIR 100 UNIT/ML ~~LOC~~ SOLN
14.0000 [IU] | Freq: Two times a day (BID) | SUBCUTANEOUS | Status: DC
  Administered 2019-05-04 – 2019-05-07 (×8): 14 [IU] via SUBCUTANEOUS
  Filled 2019-05-04 (×9): qty 0.14

## 2019-05-04 NOTE — Progress Notes (Signed)
PROGRESS NOTE  [name] [id number] DOB: 10/19/1940 DOA: 05/02/2019 PCP: Virgel Bouquet, MD   LOS: 9 days   Brief Narrative / Interim history: 78 year old female with hypertension, prior TIA, anxiety, depression came into the hospital from [facility] for hypoxic respiratory failure.  She tested positive for COVID-19 on 04/10/2019, and she was found to be hypoxic and was brought to the emergency room requiring 5 L nasal cannula.  Chest x-ray showed multifocal pneumonia  Subjective / 24h Interval events: Feeling better, alert, denies any chest pain, denies any palpitations.  No abdominal pain, no vomiting.  Assessment & Plan: Principal Problem Acute hypoxic respiratory failure with hypoxia due to COVID-19 pneumonia -Hypoxia improving, on 5-6 L.  Clinically looks better and better each day -On remdesivir and steroids, finished remdesivir on 11/25, today day 9/10 of steroids, however would favor continuing given risk of fibrotic changes  -Received Actemra on 11/23 -Started Lasix 11/25 for elevated BNP and pulmonary edema on chest x-ray, continue as long as renal function remains stable  Active Problems Acute kidney injury -Initially received fluids and creatinine has normalized, continue Lasix for now, renal function has remained stable.  Hypokalemia -Monitor daily and replete if necessary  Hyponatremia -Expected, due to Lasix, mild, monitor  Acute metabolic encephalopathy -Due to inflammatory reaction and hypoxia, try to avoid benzos, okay with Haldol  Hypertension -Continue home metoprolol  Type 2 diabetes mellitus with hyperglycemia in the setting of steroids -Most recent hemoglobin A1c is 6.3 showing good control -Currently on Levemir twice daily and sliding scale, better CBGs this morning   CBG (last 3)  Recent Labs    05/03/19 2117 05/04/19 0751 05/04/19 1137  GLUCAP 427* 200* 238*    Hyperlipidemia -Continue statin  Depression/anxiety -Continue  Zoloft  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  Agitation has now resolved  History of narcotics use -Avoid excessive narcotics but keep her comfortable   Scheduled Meds: . amLODipine  10 mg Oral QHS  . aspirin EC  325 mg Oral Daily  . atorvastatin  40 mg Oral q1800  . Chlorhexidine Gluconate Cloth  6 each Topical Daily  . cholecalciferol  1,000 Units Oral Daily  . dexamethasone (DECADRON) injection  6 mg Intravenous Q12H  . enoxaparin (LOVENOX) injection  0.5 mg/kg Subcutaneous Q24H  . furosemide  40 mg Intravenous Q12H  . gabapentin  600 mg Oral TID WC & HS  . insulin aspart  0-20 Units Subcutaneous TID WC  . insulin aspart  0-5 Units Subcutaneous QHS  . insulin aspart  4 Units Subcutaneous TID WC  . insulin detemir  14 Units Subcutaneous BID  . Ipratropium-Albuterol  1 puff Inhalation Q6H  . mouth rinse  15 mL Mouth Rinse BID  . metoprolol succinate  100 mg Oral Daily  . metoprolol succinate  50 mg Oral Daily  . pantoprazole  40 mg Oral Daily  . pneumococcal 23 valent vaccine  0.5 mL Intramuscular Tomorrow-1000  . pyridOXINE  50 mg Oral Daily  . sertraline  50 mg Oral Daily  . sodium chloride flush  10-40 mL Intracatheter Q12H  . traZODone  50 mg Oral QHS  . vitamin C  500 mg Oral Daily  . zinc sulfate  220 mg Oral Daily   Continuous Infusions: PRN Meds:.acetaminophen, albuterol, chlorpheniramine-HYDROcodone, cyclobenzaprine, guaiFENesin-dextromethorphan, haloperidol lactate, metoprolol tartrate, morphine injection, ondansetron (ZOFRAN) IV, oxyCODONE, sodium chloride, sodium chloride flush, SUMAtriptan  DVT  prophylaxis: Lovenox Code Status: Full code Family Communication: d/w Francene Boyers [facility]), [phone], [phone]  Disposition Plan: home when stable Consultants: None   Procedures:  None   Microbiology  None   Antimicrobials: None      Objective: Vitals:   05/04/19 0900 05/04/19 1000 05/04/19 1100 05/04/19 1200  BP: (!) 154/71   (!) 155/68  Pulse: 83 90 88 73  Resp: 20 (!) 24 (!) 23 14  Temp: 97.8 F (36.6 C)     TempSrc: Oral     SpO2: 92% 90% (!) 87% 98%  Weight:      Height:        Intake/Output Summary (Last 24 hours) at 05/04/2019 1337 Last data filed at 05/04/2019 1125 Gross per 24 hour  Intake 600 ml  Output 1500 ml  Net -900 ml   Filed Weights   04/18/2019 2200 05/02/19 0600  Weight: 102.1 kg 101.4 kg    Examination:  Constitutional: No distress Eyes: No scleral icterus ENMT: Moist mucous membranes Neck: normal, supple Respiratory: Clear to auscultation bilaterally without wheezing or crackles Cardiovascular: Regular rate and rhythm without murmurs, no edema Abdomen: Soft, NT, ND, bowel sounds positive Musculoskeletal: no clubbing / cyanosis.  Skin: No rashes seen Neurologic: no focal deficits   Data Reviewed: I have independently reviewed following labs and imaging studies   CBC: Recent Labs  Lab 04/28/19 1529  04/29/19 0503 04/30/19 0725 05/01/19 0904 05/02/19 0644 05/03/19 0543  WBC 9.2  --  9.2 11.2* 14.6* 16.7* 18.8*  NEUTROABS 7.5  --  7.4 9.4*  --   --   --   HGB 13.0   < > 13.2 12.9 13.9 14.0 14.3  HCT 41.5   < > 42.0 40.6 43.1 43.5 42.9  MCV 96.7  --  96.6 95.5 94.9 95.0 94.3  PLT 467*  --  428* 420* 497* 427* 374   < > = values in this interval not displayed.   Basic Metabolic Panel: Recent Labs  Lab 04/30/19 0725 05/01/19 0904 05/02/19 0644 05/03/19 0543 05/04/19 0816  NA 139 136 134* 131* 132*  K 3.5 3.4* 4.1 3.8 4.4  CL 99 95* 90* 87* 86*  CO2 31 29 32 34* 35*  GLUCOSE 318* 452* 426* 262* 211*  BUN 20 21 24* 27* 25*  CREATININE 0.62 0.69 0.66 0.62 0.61  CALCIUM 9.1 8.9 8.6* 8.6* 9.0   Liver Function Tests: Recent Labs  Lab 04/28/19 1529 04/29/19 0503 04/30/19 0725 05/01/19 0904  AST 34 26 31 33  ALT 11 9 13 18   ALKPHOS 101 104 88 99  BILITOT  0.9 0.4 0.5 1.0  PROT 7.2 7.1 6.2* 6.4*  ALBUMIN 2.7* 2.6* 2.5* 2.9*   Coagulation Profile: No results for input(s): INR, PROTIME in the last 168 hours. HbA1C: No results for input(s): HGBA1C in the last 72 hours. CBG: Recent Labs  Lab 05/03/19 1136 05/03/19 1608 05/03/19 2117 05/04/19 0751 05/04/19 1137  GLUCAP 320* 405* 427* 200* 238*    Recent Results (from the past 240 hour(s))  Blood Culture (routine x 2)     Status: None   Collection Time: 04/25/19 12:30 AM   Specimen: BLOOD LEFT HAND  Result Value Ref Range Status   Specimen Description   Final    BLOOD LEFT HAND Performed at [facility] [address] [place], [place] [address]    Special Requests   Final    BOTTLES DRAWN AEROBIC ONLY Blood Culture adequate volume Performed at [facility]  [place] [address] [place], [place] [address]    Culture   Final    NO GROWTH 5 DAYS Performed at [facility], [place] [address] [place], [place] [address]    Report Status 04/30/2019 FINAL  Final  Blood Culture (routine x 2)     Status: None   Collection Time: 04/25/19  1:30 AM   Specimen: BLOOD  Result Value Ref Range Status   Specimen Description   Final    BLOOD RIGHT ANTECUBITAL Performed at [facility] [address], [place], [place] [address]    Special Requests   Final    BOTTLES DRAWN AEROBIC AND ANAEROBIC Blood Culture adequate volume Performed at [facility] [address], [place], [place] [address]    Culture   Final    NO GROWTH 5 DAYS Performed at [facility], [place] [address] [place], [place] [address]    Report Status 04/30/2019 FINAL  Final  SARS CORONAVIRUS 2 (TAT 6-24 HRS) Nasopharyngeal Nasopharyngeal Swab     Status: Abnormal   Collection Time: 04/25/19  2:25 AM   Specimen: Nasopharyngeal Swab  Result Value Ref Range Status   SARS Coronavirus 2 POSITIVE (A) NEGATIVE Final    Comment: RESULT CALLED TO,  READ BACK BY AND VERIFIED WITH: CALLED 3X NO ANSWER [facility] ED [phone], ALSO NO ANSWER THROUGH OPERATOR. (NOTE) SARS-CoV-2 target nucleic acids are DETECTED. The SARS-CoV-2 RNA is generally detectable in upper and lower respiratory specimens during the acute phase of infection. Positive results are indicative of active infection with SARS-CoV-2. Clinical  correlation with patient history and other diagnostic information is necessary to determine patient infection status. Positive results do  not rule out bacterial infection or co-infection with other viruses. The expected result is Negative. Fact Sheet for Patients: SugarRoll.be Fact Sheet for Healthcare Providers: https://www.woods-mathews.com/ This test is not yet approved or cleared by the [place] FDA and  has been authorized for detection and/or diagnosis of SARS-CoV-2 by FDA under an Emergency Use Authorization (EUA). This EUA will remain  in effect (mea ning this test can be used) for the duration of the COVID-19 declaration under Section 564(b)(1) of the Act, 21 U.S.C. section 360bbb-3(b)(1), unless the authorization is terminated or revoked sooner. Performed at [facility], [place] [address] [place], [place] [address]      Radiology Studies: No results found.  Marzetta Board, MD, PhD [facility]  Between 7 am - 7 pm I am available, please contact me via Amion or Securechat  Between 7 pm - 7 am I am not available, please contact night coverage MD/APP via Amion

## 2019-05-04 NOTE — Progress Notes (Signed)
Updated patient son [name] about patient status

## 2019-05-04 NOTE — Progress Notes (Signed)
  Speech Language Pathology Treatment: Dysphagia  Patient Details Name: TWILIA BERHE MRN: [id number] DOB: 08/05/1940 Today's Date: 05/04/2019 Time: [id number] SLP Time Calculation (min) (ACUTE ONLY): 12 min  Assessment / Plan / Recommendation Clinical Impression  SLP provided skilled observation during lunch meal as pt self-fed textures from her tray. Pt does not like the chopped food, saying that she will not eat it chopped up so much. She ate graham crackers with mildly prolonged mastication but with her oral clearance good. Occasional, delayed throat clearing was noted across intake, which pt says has been happening at times throughout the day as she tries to clear "mucus." She appears to be doing well otherwise with her PO intake and would likely benefit from a slightly more solid diet given that she is not eating much on her trays now. Will advance to mechanical soft, still with thin liquids. Will continue to follow acutely for tolerance but do not anticipate that she will need much more SLP services.   HPI HPI: Pt is a 78 year old female admitted from SNF with hypoxic respiratory failure after testing positive for COVID-19 on 11/16. PMH includes: HTN, TIA, anxiety, depression, GERD      SLP Plan  Continue with current plan of care       Recommendations  Diet recommendations: Dysphagia 3 (mechanical soft);Thin liquid Liquids provided via: Cup;Straw Medication Administration: Whole meds with puree Supervision: Patient able to self feed Compensations: Minimize environmental distractions;Slow rate;Small sips/bites Postural Changes and/or Swallow Maneuvers: Seated upright 90 degrees                Oral Care Recommendations: Oral care BID Follow up Recommendations: None SLP Visit Diagnosis: Dysphagia, unspecified (R13.10) Plan: Continue with current plan of care       GO                Venita Sheffield Meade [place] 05/04/2019, 12:45 PM  Pollyann Glen, M.A. [facility] Acute Art therapist [phone] Office [phone]

## 2019-05-05 ENCOUNTER — Inpatient Hospital Stay (HOSPITAL_COMMUNITY): Payer: Medicare HMO

## 2019-05-05 LAB — COMPREHENSIVE METABOLIC PANEL
ALT: 31 U/L (ref 0–44)
AST: 58 U/L — ABNORMAL HIGH (ref 15–41)
Albumin: 2.5 g/dL — ABNORMAL LOW (ref 3.5–5.0)
Alkaline Phosphatase: 114 U/L (ref 38–126)
Anion gap: 11 (ref 5–15)
BUN: 28 mg/dL — ABNORMAL HIGH (ref 8–23)
CO2: 35 mmol/L — ABNORMAL HIGH (ref 22–32)
Calcium: 8.8 mg/dL — ABNORMAL LOW (ref 8.9–10.3)
Chloride: 87 mmol/L — ABNORMAL LOW (ref 98–111)
Creatinine, Ser: 0.64 mg/dL (ref 0.44–1.00)
GFR calc Af Amer: >60 mL/min (ref >60)
GFR calc non Af Amer: >60 mL/min (ref >60)
Glucose, Bld: 139 mg/dL — ABNORMAL HIGH (ref 70–99)
Potassium: 4.4 mmol/L (ref 3.5–5.1)
Sodium: 133 mmol/L — ABNORMAL LOW (ref 135–145)
Total Bilirubin: 0.7 mg/dL (ref 0.3–1.2)
Total Protein: 5.6 g/dL — ABNORMAL LOW (ref 6.5–8.1)

## 2019-05-05 LAB — CBC
HCT: 44.6 % (ref 36.0–46.0)
Hemoglobin: 14.7 g/dL (ref 12.0–15.0)
MCH: 31.3 pg (ref 26.0–34.0)
MCHC: 33 g/dL (ref 30.0–36.0)
MCV: 95.1 fL (ref 80.0–100.0)
Platelets: 321 10*3/uL (ref 150–400)
RBC: 4.69 MIL/uL (ref 3.87–5.11)
RDW: 14.7 % (ref 11.5–15.5)
WBC: 22.1 10*3/uL — ABNORMAL HIGH (ref 4.0–10.5)
nRBC: 0.1 % (ref 0.0–0.2)

## 2019-05-05 LAB — GLUCOSE, CAPILLARY
Glucose-Capillary: 269 mg/dL — ABNORMAL HIGH (ref 70–99)
Glucose-Capillary: 155 mg/dL — ABNORMAL HIGH (ref 70–99)
Glucose-Capillary: 162 mg/dL — ABNORMAL HIGH (ref 70–99)
Glucose-Capillary: 276 mg/dL — ABNORMAL HIGH (ref 70–99)

## 2019-05-05 LAB — EXPECTORATED SPUTUM ASSESSMENT W GRAM STAIN, RFLX TO RESP C

## 2019-05-05 MED ORDER — SODIUM CHLORIDE 0.9 % IV SOLN
2.0000 g | Freq: Three times a day (TID) | INTRAVENOUS | Status: AC
  Administered 2019-05-05 – 2019-05-12 (×23): 2 g via INTRAVENOUS
  Filled 2019-05-05 (×23): qty 2

## 2019-05-05 NOTE — Consult Note (Signed)
   [facility] CM Inpatient Consult   05/05/2019  [name] 30-Apr-1941 [id number]   Patient screened for extreme high risk score for unplanned readmission score of 39% patient in [facility] to assess for long length of stay for post hospital follow up needs.   Review of patient's medical record reveals patient is being recommended for a skilled facility stay as she is a long term resident of a skilled nursing facility.  Primary Care Provider is listed as Virgel Bouquet, MD [[facility]] not in the [facility].   Plan:  Will sign off after extensive review of medical records indicating she is long term skilled.    For questions contact:   Natividad Brood, RN BSN [facility] Liaison  [phone] business mobile phone Toll free office [phone]  Fax number: [phone] [email or web address]

## 2019-05-05 NOTE — Plan of Care (Signed)
Went over [place] with pt. All questions asked and all questions answered.

## 2019-05-05 NOTE — Progress Notes (Signed)
Occupational Therapy Treatment Patient Details Name: NOEMY PELLERIN MRN: [id number] DOB: [birth year] Today's Date: 05/05/2019    History of present illness 78 y.o. female past medical history significant for hypertension TIA anxiety depression presents to the hospital from [facility] via EMS for hypoxia she tested positive for COVID-19 on 04/10/2019. pt admitted with acute respiratory failure with hypoxia due to COVID 19 PNA.   OT comments  Attempted to mobilize OOB to chair, however pt orthostatic (see docflow sheets) and symptomatic. Sat EOB x 10 min. Returned to supine and left in chair position. Max A for bed mobility to clean periarea and change linens. Completed seated exercises and flutter valve in seated position. Staff will need to use Maximove to mobilize out of bed due to increased weakness. Recommend nsg staff mobilize pt OOB to chair daily with maximove. Pt states she knows she needs to get out of the bed. Pt tearful at times due to her weakness and inability to care for herself. Sesison completed on 6L HFNC with deat into low 80s at times. Pt cooperative and willing to pariticiapte thorughout session. Pt will need rehab at SNF. Will continue to follow acutely.   Follow Up Recommendations  SNF;Supervision/Assistance - 24 hour    Equipment Recommendations  None recommended by OT    Recommendations for Other Services      Precautions / Restrictions Precautions Precautions: Fall Precaution Comments: monitor O2; orthostatic       Mobility Bed Mobility Overal bed mobility: Needs Assistance   Rolling: Max assist Sidelying to sit: Max assist   Sit to supine: Max assist;+2 for physical assistance      Transfers                 General transfer comment: Unable to complete this session due to hypotension; Sat EOB x 10 min    Balance     Sitting balance-Leahy Scale: Poor                                     ADL either performed or assessed with  clinical judgement   ADL       Grooming: Minimal assistance;Sitting Grooming Details (indicate cue type and reason): Assist with hair Upper Body Bathing: Minimal assistance;Sitting   Lower Body Bathing: Moderate assistance;Bed level   Upper Body Dressing : Moderate assistance;Sitting   Lower Body Dressing: Maximal assistance;Bed level     Toilet Transfer Details (indicate cue type and reason): unable to complete Toileting- Clothing Manipulation and Hygiene: Total assistance       Functional mobility during ADLs: +2 for physical assistance(unable to complete)       Vision       Perception     Praxis      Cognition Arousal/Alertness: Awake/alert Behavior During Therapy: WFL for tasks assessed/performed(tearful) Overall Cognitive Status: No family/caregiver present to determine baseline cognitive functioning                                 General Comments: most likely at baseline        Exercises Exercises: General Upper Extremity General Exercises - Upper Extremity Shoulder Flexion: Strengthening;10 reps;Theraband;Seated Elbow Extension: Strengthening;Both;10 reps;Seated;Theraband Theraband Level (Elbow Extension): Level 1 (Yellow) Other Exercises Other Exercises: flutter valve x 10   Shoulder Instructions       General Comments  Pertinent Vitals/ Pain       Pain Assessment: 0-10 Pain Score: 8  Pain Location: back pain Pain Descriptors / Indicators: Aching;Discomfort Pain Intervention(s): Limited activity within patient's tolerance  Home Living                                          Prior Functioning/Environment              Frequency  Min 2X/week        Progress Toward Goals  OT Goals(current goals can now be found in the care plan section)  Progress towards OT goals: Not progressing toward goals - comment(decline in status)  Acute Rehab OT Goals Patient Stated Goal: to get stronger OT Goal  Formulation: With patient Time For Goal Achievement: 05/15/19 Potential to Achieve Goals: Good ADL Goals Pt Will Perform Upper Body Bathing: sitting;with set-up Pt Will Perform Lower Body Bathing: with mod assist;sit to/from stand;sitting/lateral leans Pt Will Transfer to Toilet: with min assist;bedside commode;squat pivot transfer Pt/caregiver will Perform Home Exercise Program: Both right and left upper extremity;With theraband;With written HEP provided;With Supervision;Increased strength  Plan Discharge plan remains appropriate    Co-evaluation                 AM-PAC OT "6 Clicks" Daily Activity     Outcome Measure   Help from another person eating meals?: None Help from another person taking care of personal grooming?: A Little Help from another person toileting, which includes using toliet, bedpan, or urinal?: Total Help from another person bathing (including washing, rinsing, drying)?: A Lot Help from another person to put on and taking off regular upper body clothing?: A Lot Help from another person to put on and taking off regular lower body clothing?: Total 6 Click Score: 13    End of Session Equipment Utilized During Treatment: Oxygen  OT Visit Diagnosis: Unsteadiness on feet (R26.81);Other abnormalities of gait and mobility (R26.89);Muscle weakness (generalized) (M62.81);Pain Pain - part of body: (back)   Activity Tolerance Treatment limited secondary to medical complications (Comment)(orthostasis)   Patient Left in bed;with call bell/phone within reach;with bed alarm set;Other (comment)(chair position)   Nurse Communication Mobility status;Need for lift equipment        Time: 1110-1155 OT Time Calculation (min): 45 min  Charges: OT General Charges $OT Visit: 1 Visit OT Treatments $Self Care/Home Management : 23-37 mins  Maurie Boettcher, OT/L   Acute OT Clinical Specialist [place] Pager [phone] Office  [phone]    [facility] 05/05/2019, 1:21 PM

## 2019-05-05 NOTE — Progress Notes (Signed)
   05/05/19 0912  Family/Significant Other Communication  Family/Significant Other Update Other (Comment)  Pt refused. Pt states, "I have my cell phone and they call me daily when they are free. I want it that way. I don't want to bug them unless they are free. Thank you though."

## 2019-05-05 NOTE — Progress Notes (Signed)
Pharmacy Antibiotic Note  [name] is a 78 y.o. female admitted on 04/16/2019 with respiratory failure, now with WBC 22 and increased sputum production.  Pharmacy has been consulted for cefepime dosing. SCr 0.64, CrCl ~ 69 ml/min  Plan: Cefepime 2g IV every 8 hours Monitor renal function, sputum Cx and clinical progression to narrow  Height: 5\' 5"  (165.1 cm) Weight: 223 lb 8.7 oz (101.4 kg) IBW/kg (Calculated) : 57  Temp (24hrs), Avg:97.4 F (36.3 C), Min:96.5 F (35.8 C), Max:97.7 F (36.5 C)  Recent Labs  Lab 04/30/19 0725 05/01/19 0904 05/02/19 0644 05/03/19 0543 05/04/19 0816 05/05/19 0530  WBC 11.2* 14.6* 16.7* 18.8*  --  22.1*  CREATININE 0.62 0.69 0.66 0.62 0.61 0.64    Estimated Creatinine Clearance: 69.5 mL/min (by C-G formula based on SCr of 0.64 mg/dL).    Allergies  Allergen Reactions  . Contrast Media [Iodinated Diagnostic Agents] Anaphylaxis    Pt denies allergy as of 08/13/2018.   Ignacia [facility] Drugs Cross Reactors Nausea And Vomiting    Antimicrobials this admission: 11/21 Vancomycin >>11/24 11/21 Cefepime >>11/24 11/21 remdesivir>>11/25 11/23 Actemra x 1 11/23  Microbiology results: 11/21BCx: Negative 11/21 COVID: positive 12/1 sputum: sent  Bertis Ruddy, PharmD Clinical Pharmacist Please check AMION for all [place] numbers 05/05/2019 2:46 PM

## 2019-05-05 NOTE — Progress Notes (Signed)
Physical Therapy Treatment Patient Details Name: [name] MRN: [id number] DOB: 18-Oct-1940 Today's Date: 05/05/2019    History of Present Illness 78 y.o. female past medical history significant for hypertension TIA anxiety depression presents to the hospital from [facility] via EMS for hypoxia she tested positive for COVID-19 on 04/10/2019. pt admitted with acute respiratory failure with hypoxia due to COVID 19 PNA.  Chest xray 12/1 shows small pleural effusion.    PT Comments    The patient I reports feeling terrible,feels dizziness when sitting on bed edge. SPO2 on 4 LNC down into mid 80's, Patient with noted orthostatic  hypostension-see flow sheets for BP's entered at 1100 hour. The patient is  Not progressing at this time. Patient will require maximove for OOB> Encouraged IS and flutter and left patient in bed/chair position. Continue PT for mobility.   Follow Up Recommendations  SNF     Equipment Recommendations    none   Recommendations for Other Services       Precautions / Restrictions Precautions Precautions: Fall Precaution Comments: monitor O2; orthostatic, very weak    Mobility  Bed Mobility Overal bed mobility: Needs Assistance Bed Mobility: Rolling;Sidelying to Sit;Sit to Supine Rolling: Max assist Sidelying to sit: Max assist   Sit to supine: Max assist;+2 for physical assistance   General bed mobility comments: A to scoot legs off bed, assist trunk to sitting upright  Transfers                 General transfer comment: Unable to complete this session due to hypotension; Sat EOB x 10 min  Ambulation/Gait                 Stairs             Wheelchair Mobility    Modified Rankin (Stroke Patients Only)       Balance Overall balance assessment: Needs assistance Sitting-balance support: Single extremity supported;Feet supported Sitting balance-Leahy Scale: Poor Sitting balance - Comments: leaning on therapist, eyes  closed                                    Cognition Arousal/Alertness: Awake/alert Behavior During Therapy: WFL for tasks assessed/performed Overall Cognitive Status: No family/caregiver present to determine baseline cognitive functioning                                 General Comments: most likely at baseline      Exercises General Exercises - Upper Extremity Shoulder Flexion: Strengthening;10 reps;Theraband;Seated Elbow Extension: Strengthening;Both;10 reps;Seated;Theraband Theraband Level (Elbow Extension): Level 1 (Yellow) Other Exercises Other Exercises: flutter valve x 10    General Comments        Pertinent Vitals/Pain Pain Assessment: 0-10 Pain Score: 8  Pain Location: back pain Pain Descriptors / Indicators: Aching;Discomfort Pain Intervention(s): Monitored during session;Repositioned    Home Living                      Prior Function            PT Goals (current goals can now be found in the care plan section) Acute Rehab PT Goals Patient Stated Goal: to get stronger Progress towards PT goals: Not progressing toward goals - comment(patient is much weaker)    Frequency    Min 2X/week  PT Plan Current plan remains appropriate    Co-evaluation PT/OT/SLP Co-Evaluation/Treatment: Yes Reason for Co-Treatment: For patient/therapist safety PT goals addressed during session: Mobility/safety with mobility OT goals addressed during session: ADL's and self-care      AM-PAC PT "6 Clicks" Mobility   Outcome Measure  Help needed turning from your back to your side while in a flat bed without using bedrails?: A Lot Help needed moving from lying on your back to sitting on the side of a flat bed without using bedrails?: A Lot Help needed moving to and from a bed to a chair (including a wheelchair)?: Total Help needed standing up from a chair using your arms (e.g., wheelchair or bedside chair)?: Total Help needed to  walk in hospital room?: Total Help needed climbing 3-5 steps with a railing? : Total 6 Click Score: 8    End of Session Equipment Utilized During Treatment: Oxygen Activity Tolerance: Patient limited by fatigue;Treatment limited secondary to medical complications (Comment) Patient left: in bed;with call bell/phone within reach Nurse Communication: Mobility status;Need for lift equipment PT Visit Diagnosis: Unsteadiness on feet (R26.81);Muscle weakness (generalized) (M62.81)     Time: [id number] PT Time Calculation (min) (ACUTE ONLY): 40 min  Charges:  $Therapeutic Activity: 8-22 mins                     Tresa Endo PT Acute Rehabilitation Services Pager [phone] Office [phone]    Claretha Cooper 05/05/2019, 1:31 PM

## 2019-05-05 NOTE — Progress Notes (Addendum)
PROGRESS NOTE  [name] [id number] DOB: 07-Aug-1940 DOA: 04/20/2019 PCP: Virgel Bouquet, MD   LOS: 10 days   Brief Narrative / Interim history: 78 year old female with hypertension, prior TIA, anxiety, depression came into the hospital from [facility] for hypoxic respiratory failure.  She tested positive for COVID-19 on 04/10/2019, and she was found to be hypoxic and was brought to the emergency room requiring 5 L nasal cannula.  Chest x-ray showed multifocal pneumonia.  She initially got worse requiring 15 L nasal cannula but now slowly improving.  Subjective / 24h Interval events: Feeling slightly worse today.  Tells me that her cough is increased and she is having more sputum and now seeing streaks of blood.  Assessment & Plan: Principal Problem Acute hypoxic respiratory failure with hypoxia due to COVID-19 pneumonia / concern for HCAP on 12/1 -Hypoxia improving, on 5-6 L.  -On remdesivir and steroids, finished remdesivir on 11/25, today day 10/10 of steroids, however would favor continuing given risk of fibrotic changes  -Received Actemra on 11/23 -Started Lasix 11/25 for elevated BNP and pulmonary edema on chest x-ray, continue today, renal function is remaining stable -Patient has also been having increased leukocytosis with a white count today of 22K, and given more sputum production otherwise a chest x-ray which showed no substantial change but bilateral multilobar pneumonia and possible small left pleural effusion. -Given increased in the white count, more sputum production and subjective decline have sent a sputum for micro and will cover with cefepime until results come back  Active Problems Acute kidney injury -Initially received fluids and creatinine has normalized, continue Lasix, renal function is stable  Hypokalemia -Monitor daily and replete if necessary  Hyponatremia -Expected, due to Lasix, monitor  Acute metabolic encephalopathy -Due to inflammatory  reaction and hypoxia, try to avoid benzos, okay with Haldol  Hypertension -Continue home metoprolol  Type 2 diabetes mellitus with hyperglycemia in the setting of steroids -Most recent hemoglobin A1c is 6.3 showing good control -Currently on Levemir twice daily and sliding scale, CBGs are stable   CBG (last 3)  Recent Labs    05/04/19 2127 05/05/19 0733 05/05/19 1110  GLUCAP 275* 155* 162*    Hyperlipidemia -Continue statin  Depression/anxiety -Continue Zoloft  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  Agitation has now resolved and she is back to baseline  History of narcotics use -Avoid excessive narcotics but keep her comfortable   Scheduled Meds: . amLODipine  10 mg Oral QHS  . aspirin EC  325 mg Oral Daily  . atorvastatin  40 mg Oral q1800  . Chlorhexidine Gluconate Cloth  6 each Topical Daily  . cholecalciferol  1,000 Units Oral Daily  . dexamethasone (DECADRON) injection  6 mg Intravenous Q12H  . enoxaparin (LOVENOX) injection  0.5 mg/kg Subcutaneous Q24H  . furosemide  40 mg Intravenous Q12H  . gabapentin  600 mg Oral TID WC & HS  . insulin aspart  0-20 Units Subcutaneous TID WC  . insulin aspart  0-5 Units Subcutaneous QHS  . insulin aspart  4 Units Subcutaneous TID WC  . insulin detemir  14 Units Subcutaneous BID  . Ipratropium-Albuterol  1 puff Inhalation Q6H  . mouth rinse  15 mL Mouth Rinse BID  . metoprolol succinate  100 mg Oral Daily  . metoprolol succinate  50 mg Oral Daily  . pantoprazole  40 mg Oral Daily  . pneumococcal 23 valent  vaccine  0.5 mL Intramuscular Tomorrow-1000  . pyridOXINE  50 mg Oral Daily  . sertraline  50 mg Oral Daily  . sodium chloride flush  10-40 mL Intracatheter Q12H  . traZODone  50 mg Oral QHS  . vitamin C  500 mg Oral Daily  . zinc sulfate  220 mg Oral Daily   Continuous Infusions: PRN Meds:.acetaminophen,  albuterol, chlorpheniramine-HYDROcodone, cyclobenzaprine, guaiFENesin-dextromethorphan, haloperidol lactate, metoprolol tartrate, morphine injection, ondansetron (ZOFRAN) IV, oxyCODONE, sodium chloride, sodium chloride flush, SUMAtriptan  DVT prophylaxis: Lovenox Code Status: Full code Family Communication: d/w Francene Boyers ([name]), [phone], [phone] on 11/30.  Unable to reach on 12/1 Disposition Plan: home when stable Consultants: None   Procedures:  None   Microbiology  None   Antimicrobials: None     Objective: Vitals:   05/05/19 1100 05/05/19 1145 05/05/19 1146 05/05/19 1234  BP: 111/84 108/76 125/68 (!) 123/54  Pulse:      Resp:      Temp:      TempSrc:      SpO2:      Weight:      Height:        Intake/Output Summary (Last 24 hours) at 05/05/2019 1427 Last data filed at 05/05/2019 1317 Gross per 24 hour  Intake 1580 ml  Output 1500 ml  Net 80 ml   Filed Weights   04/27/2019 2200 05/02/19 0600  Weight: 102.1 kg 101.4 kg    Examination:  Constitutional: NAD Eyes: no icterus ENMT: mmm Neck: normal, supple Respiratory: diminished at the bases, no wheezing, no crackles  Cardiovascular: RRR, no murmurs Abdomen: soft, NT, ND, BS+ Musculoskeletal: no clubbing / cyanosis.  Skin: No rashes seen Neurologic: no focal deficits   Data Reviewed: I have independently reviewed following labs and imaging studies   CBC: Recent Labs  Lab 04/28/19 1529  04/29/19 0503 04/30/19 0725 05/01/19 0904 05/02/19 0644 05/03/19 0543 05/05/19 0530  WBC 9.2  --  9.2 11.2* 14.6* 16.7* 18.8* 22.1*  NEUTROABS 7.5  --  7.4 9.4*  --   --   --   --   HGB 13.0   < > 13.2 12.9 13.9 14.0 14.3 14.7  HCT 41.5   < > 42.0 40.6 43.1 43.5 42.9 44.6  MCV 96.7  --  96.6 95.5 94.9 95.0 94.3 95.1  PLT 467*  --  428* 420* 497* 427* 374 321   < > = values in this interval not displayed.   Basic Metabolic Panel: Recent Labs  Lab 05/01/19 0904 05/02/19 0644 05/03/19 0543  05/04/19 0816 05/05/19 0530  NA 136 134* 131* 132* 133*  K 3.4* 4.1 3.8 4.4 4.4  CL 95* 90* 87* 86* 87*  CO2 29 32 34* 35* 35*  GLUCOSE 452* 426* 262* 211* 139*  BUN 21 24* 27* 25* 28*  CREATININE 0.69 0.66 0.62 0.61 0.64  CALCIUM 8.9 8.6* 8.6* 9.0 8.8*   Liver Function Tests: Recent Labs  Lab 04/28/19 1529 04/29/19 0503 04/30/19 0725 05/01/19 0904 05/05/19 0530  AST 34 26 31 33 58*  ALT 11 9 13 18 31   ALKPHOS 101 104 88 99 114  BILITOT 0.9 0.4 0.5 1.0 0.7  PROT 7.2 7.1 6.2* 6.4* 5.6*  ALBUMIN 2.7* 2.6* 2.5* 2.9* 2.5*   Coagulation Profile: No results for input(s): INR, PROTIME in the last 168 hours. HbA1C: No results for input(s): HGBA1C in the last 72 hours. CBG: Recent Labs  Lab 05/04/19 1137 05/04/19 1636 05/04/19 2127 05/05/19 0733 05/05/19 1110  GLUCAP  238* 269* 275* 155* 162*    No results found for this or any previous visit (from the past 240 hour(s)).   Radiology Studies: Dg Chest Port 1 View  Result Date: 05/05/2019 CLINICAL DATA:  Hypoxemia, COVID-19 EXAM: PORTABLE CHEST 1 VIEW COMPARISON:  04/29/2019 chest radiograph. FINDINGS: Right PICC terminates in middle third of the SVC. Stable moderate right hemidiaphragm elevation. Surgical hardware from ACDF in the lower cervical spine. Stable cardiomediastinal silhouette with top-normal heart size. No pneumothorax. No right pleural effusion. Worsening blunting of the left costophrenic angle. Hazy opacities throughout both lungs, most prominent in lower lungs bilaterally, similar. IMPRESSION: No substantial change in hazy diffuse lung opacities, most prominent in the lower lungs, compatible with multilobar pneumonia. Worsening blunting of the left costophrenic angle, equivocal for small left pleural effusion. Electronically Signed   By: Ilona Sorrel M.D.   On: 05/05/2019 10:32    Marzetta Board, MD, PhD [facility]  Between 7 am - 7 pm I am available, please contact me via Amion or Securechat   Between 7 pm - 7 am I am not available, please contact night coverage MD/APP via Amion

## 2019-05-05 DEATH — deceased

## 2019-05-06 LAB — CBC
HCT: 43.4 % (ref 36.0–46.0)
Hemoglobin: 14.4 g/dL (ref 12.0–15.0)
MCH: 31.6 pg (ref 26.0–34.0)
MCHC: 33.2 g/dL (ref 30.0–36.0)
MCV: 95.2 fL (ref 80.0–100.0)
Platelets: 267 10*3/uL (ref 150–400)
RBC: 4.56 MIL/uL (ref 3.87–5.11)
RDW: 14.7 % (ref 11.5–15.5)
WBC: 19.2 10*3/uL — ABNORMAL HIGH (ref 4.0–10.5)
nRBC: 0.1 % (ref 0.0–0.2)

## 2019-05-06 LAB — COMPREHENSIVE METABOLIC PANEL
ALT: 35 U/L (ref 0–44)
AST: 46 U/L — ABNORMAL HIGH (ref 15–41)
Albumin: 2.5 g/dL — ABNORMAL LOW (ref 3.5–5.0)
Alkaline Phosphatase: 90 U/L (ref 38–126)
Anion gap: 11 (ref 5–15)
BUN: 30 mg/dL — ABNORMAL HIGH (ref 8–23)
CO2: 34 mmol/L — ABNORMAL HIGH (ref 22–32)
Calcium: 8.9 mg/dL (ref 8.9–10.3)
Chloride: 88 mmol/L — ABNORMAL LOW (ref 98–111)
Creatinine, Ser: 0.66 mg/dL (ref 0.44–1.00)
GFR calc Af Amer: >60 mL/min (ref >60)
GFR calc non Af Amer: >60 mL/min (ref >60)
Glucose, Bld: 201 mg/dL — ABNORMAL HIGH (ref 70–99)
Potassium: 4.1 mmol/L (ref 3.5–5.1)
Sodium: 133 mmol/L — ABNORMAL LOW (ref 135–145)
Total Bilirubin: 0.7 mg/dL (ref 0.3–1.2)
Total Protein: 5.6 g/dL — ABNORMAL LOW (ref 6.5–8.1)

## 2019-05-06 LAB — GLUCOSE, CAPILLARY
Glucose-Capillary: 280 mg/dL — ABNORMAL HIGH (ref 70–99)
Glucose-Capillary: 178 mg/dL — ABNORMAL HIGH (ref 70–99)
Glucose-Capillary: 318 mg/dL — ABNORMAL HIGH (ref 70–99)
Glucose-Capillary: 269 mg/dL — ABNORMAL HIGH (ref 70–99)

## 2019-05-06 LAB — C-REACTIVE PROTEIN: CRP: 1 mg/dL — ABNORMAL HIGH (ref <1.0)

## 2019-05-06 LAB — D-DIMER, QUANTITATIVE: D-Dimer, Quant: 0.52 ug/mL-FEU — ABNORMAL HIGH (ref 0.00–0.50)

## 2019-05-06 MED ORDER — SODIUM CHLORIDE 0.9% FLUSH
10.0000 mL | Freq: Two times a day (BID) | INTRAVENOUS | Status: DC
  Administered 2019-05-06 – 2019-05-10 (×8): 10 mL

## 2019-05-06 MED ORDER — SODIUM CHLORIDE 0.9% FLUSH: 10.0000 mL | INTRAVENOUS | Status: DC | PRN

## 2019-05-06 MED ORDER — HALOPERIDOL LACTATE 5 MG/ML IJ SOLN: 1.0000 mg | Freq: Four times a day (QID) | INTRAMUSCULAR | Status: DC | PRN

## 2019-05-06 NOTE — Progress Notes (Signed)
PROGRESS NOTE    [name]  [id number] DOB: 1940-10-26 DOA: 04/17/2019 PCP: Virgel Bouquet, MD    Brief Narrative:  78 yo female who presented with low oxygen saturation.  She does have significant past medical history for hypertension, dyslipidemia, TIA, GERD, anxiety, depression and arthritis.  He tested positive for COVID-19 November 6.  [name] a nursing home resident.  Patient had a dry cough, she was quarantined at a skilled nursing facility.  On the day of hospitalization she was noted to be hypoxic, requiring supplemental oxygen per nasal cannula.  On her initial physical examination her oximetry was 76% on room air, blood pressure was 101/73, pulse rate 76, respirate 15, oxygen saturation 95% on 6 L of supplemental oxygen per nasal cannula.  She had coarse breath sounds bilaterally, heart S1-S2 present rhythmic, abdomen soft, no lower extremity edema.  Her chest radiograph had bilateral interstitial infiltrates, right upper lobe and left upper lobe.  Patient was admitted to the hospital working diagnosis of acute hypoxic respiratory failure due to SARS COVID-19 viral pneumonia.  Patient had increased oxygen requirements up to 15 L per nasal cannula, she has received remdesivir along with corticosteroids, and 1 dose of Actemra.  Her hospitalization has been complicated by tracheobronchitis, acute kidney injury, hypokalemia, hyponatremia and acute metabolic encephalopathy.   Assessment & Plan:   Principal Problem:   Multifocal pneumonia Active Problems:   Depression   Acute respiratory failure with hypoxia (HCC)   Hyperlipidemia   Essential hypertension   Pneumonia due to COVID-19 virus   1. Acute hypoxic respiratory failure due to SARS COVID 19 viral pneumonia/ suspected bacterial coinfection. Patient received Actemra 11/23, completed remdesivir on 11/25.   RR: 16  Pulse oxymetry: 95%  Fi02: 5 LPM per HFNC  COVID-19 Labs  Recent Labs    05/06/19 0500  DDIMER  0.52*  CRP 1.0*    Lab Results  Component Value Date   SARSCOV2NAA POSITIVE (A) 04/25/2019   Patient has been on dexamethasone # 11. Her symptoms slowly improving. Will continue antibiotic therapy with cefepime but will discontinue systemic steroids for now. Continue oxymetry monitoring, out of bed to chair and physical therapy evaluation. Continue with bronchodilators.   2. AKI with hypokalemia and hyponatremia. Renal function with serum cr at 0,66 with K at 4,1 and serum bicarbonate at 34. Will continue close follow up of renal function and electrolytes.   3. Acute metabolic encephalopathy. Patient is more awake and alert, able to communicate by writing. Continue neuro checks per unit protocol. Will reduce dose of haldol to 1 mg as needed from 5 mg.   4. HTN. Continue blood pressure monitoring. Continue amlodipine and metoprolol for blood pressure control.   5. T2DM with dyslipidemia. Continue glucose cover with insulin sliding scale., will hold on systemic corticosteroids. Continue with atorvastatin. Basal insulin 14 units bid, with fasting glucose 201.      DVT prophylaxis: enoxaparin   Code Status:  full Family Communication: no family at the bedside  Disposition Plan/ discharge barriers: pending clinical improvement.   Body mass index is 37.2 kg/m. Malnutrition Type:      Malnutrition Characteristics:      Nutrition Interventions:     RN Pressure Injury Documentation:     Consultants:     Procedures:     Antimicrobials:   Cefepime.    Subjective: Patient is feeling better, dyspnea is improving but not yet back to baseline, no nausea or vomiting, has not been out of the  bed yet.   Objective: Vitals:   05/05/19 1541 05/05/19 1950 05/06/19 0436 05/06/19 0742  BP:  (!) 134/54 (!) 145/76 129/70  Pulse:   84 85  Resp:  19 16 16   Temp: 97.6 F (36.4 C) 99.5 F (37.5 C) 97.7 F (36.5 C) (!) 97.5 F (36.4 C)  TempSrc: Oral Oral Oral Oral  SpO2:    95% 95%  Weight:      Height:        Intake/Output Summary (Last 24 hours) at 05/06/2019 0911 Last data filed at 05/06/2019 [id number] Gross per 24 hour  Intake 1163.78 ml  Output 2550 ml  Net -1386.22 ml   Filed Weights   04/10/2019 2200 05/02/19 0600  Weight: 102.1 kg 101.4 kg    Examination:   General: Not in pain, mild dyspnea, deconditioned  Neurology: Awake and alert, non focal  E ENT: mild pallor, no icterus, oral mucosa moist Cardiovascular: No JVD. S1-S2 present, rhythmic, no gallops, rubs, or murmurs. No lower extremity edema. Pulmonary: positive breath sounds bilaterally. Gastrointestinal. Abdomen with no organomegaly, non tender, no rebound or guarding Skin. No rashes Musculoskeletal: no joint deformities     Data Reviewed: I have personally reviewed following labs and imaging studies  CBC: Recent Labs  Lab 04/30/19 0725 05/01/19 0904 05/02/19 0644 05/03/19 0543 05/05/19 0530 05/06/19 0500  WBC 11.2* 14.6* 16.7* 18.8* 22.1* 19.2*  NEUTROABS 9.4*  --   --   --   --   --   HGB 12.9 13.9 14.0 14.3 14.7 14.4  HCT 40.6 43.1 43.5 42.9 44.6 43.4  MCV 95.5 94.9 95.0 94.3 95.1 95.2  PLT 420* 497* 427* 374 321 [id number]   Basic Metabolic Panel: Recent Labs  Lab 05/02/19 0644 05/03/19 0543 05/04/19 0816 05/05/19 0530 05/06/19 0500  NA 134* 131* 132* 133* 133*  K 4.1 3.8 4.4 4.4 4.1  CL 90* 87* 86* 87* 88*  CO2 32 34* 35* 35* 34*  GLUCOSE 426* 262* 211* 139* 201*  BUN 24* 27* 25* 28* 30*  CREATININE 0.66 0.62 0.61 0.64 0.66  CALCIUM 8.6* 8.6* 9.0 8.8* 8.9   GFR: Estimated Creatinine Clearance: 69.5 mL/min (by C-G formula based on SCr of 0.66 mg/dL). Liver Function Tests: Recent Labs  Lab 04/30/19 0725 05/01/19 0904 05/05/19 0530 05/06/19 0500  AST 31 33 58* 46*  ALT 13 18 31  35  ALKPHOS 88 99 114 90  BILITOT 0.5 1.0 0.7 0.7  PROT 6.2* 6.4* 5.6* 5.6*  ALBUMIN 2.5* 2.9* 2.5* 2.5*   No results for input(s): LIPASE, AMYLASE in the last 168 hours. No  results for input(s): AMMONIA in the last 168 hours. Coagulation Profile: No results for input(s): INR, PROTIME in the last 168 hours. Cardiac Enzymes: No results for input(s): CKTOTAL, CKMB, CKMBINDEX, TROPONINI in the last 168 hours. BNP (last 3 results) No results for input(s): PROBNP in the last 8760 hours. HbA1C: No results for input(s): HGBA1C in the last 72 hours. CBG: Recent Labs  Lab 05/04/19 2127 05/05/19 0733 05/05/19 1110 05/05/19 1953 05/06/19 0742  GLUCAP 275* 155* 162* 276* 178*   Lipid Profile: No results for input(s): CHOL, HDL, LDLCALC, TRIG, CHOLHDL, LDLDIRECT in the last 72 hours. Thyroid Function Tests: No results for input(s): TSH, T4TOTAL, FREET4, T3FREE, THYROIDAB in the last 72 hours. Anemia Panel: No results for input(s): VITAMINB12, FOLATE, FERRITIN, TIBC, IRON, RETICCTPCT in the last 72 hours.    Radiology Studies: I have reviewed all of the imaging during this hospital visit personally  Scheduled Meds: . amLODipine  10 mg Oral QHS  . aspirin EC  325 mg Oral Daily  . atorvastatin  40 mg Oral q1800  . Chlorhexidine Gluconate Cloth  6 each Topical Daily  . cholecalciferol  1,000 Units Oral Daily  . dexamethasone (DECADRON) injection  6 mg Intravenous Q12H  . enoxaparin (LOVENOX) injection  0.5 mg/kg Subcutaneous Q24H  . furosemide  40 mg Intravenous Q12H  . gabapentin  600 mg Oral TID WC & HS  . insulin aspart  0-20 Units Subcutaneous TID WC  . insulin aspart  0-5 Units Subcutaneous QHS  . insulin aspart  4 Units Subcutaneous TID WC  . insulin detemir  14 Units Subcutaneous BID  . Ipratropium-Albuterol  1 puff Inhalation Q6H  . mouth rinse  15 mL Mouth Rinse BID  . metoprolol succinate  100 mg Oral Daily  . metoprolol succinate  50 mg Oral Daily  . pantoprazole  40 mg Oral Daily  . pneumococcal 23 valent vaccine  0.5 mL Intramuscular Tomorrow-1000  . pyridOXINE  50 mg Oral Daily  . sertraline  50 mg Oral Daily  . sodium chloride  flush  10-40 mL Intracatheter Q12H  . sodium chloride flush  10-40 mL Intracatheter Q12H  . traZODone  50 mg Oral QHS  . vitamin C  500 mg Oral Daily  . zinc sulfate  220 mg Oral Daily   Continuous Infusions: . ceFEPime (MAXIPIME) IV 2 g (05/06/19 0531)     LOS: 11 days        Mauricio Gerome Apley, MD

## 2019-05-06 NOTE — Progress Notes (Signed)
  Speech Language Pathology Treatment: Dysphagia  Patient Details Name: [name] MRN: [id number] DOB: 27-Feb-1941 Today's Date: 05/06/2019 Time: 2284-0698 SLP Time Calculation (min) (ACUTE ONLY): 11 min  Assessment / Plan / Recommendation Clinical Impression  Pt reports she is as satisfied as she can be with current meal texture. She states the food is dry but manageable and RN reprots pt was able to eat the solids on are breakfast tray. Her intake of meals is till quite limited at lunch and she reports her appetite is not back. When observed with thin liquids via straw, no further persistent throat clearing noted. RN does report O2 sats decrease with meals. Likely due to fatigue and increased demand on respiratory function. Advised fully upright positioning, to chair if possible to open chest and improve swallow mechanics. Really no further SLP interventions are needed as pt does not have any specfic swallowing deficits and is aware of recommended precautions. Will sign off.   HPI HPI: Pt is a 78 year old female admitted from SNF with hypoxic respiratory failure after testing positive for COVID-19 on 11/16. PMH includes: HTN, TIA, anxiety, depression, GERD      SLP Plan  All goals met       Recommendations  Diet recommendations: Dysphagia 3 (mechanical soft);Thin liquid Liquids provided via: Cup;Straw Medication Administration: Whole meds with puree Supervision: Patient able to self feed Compensations: Minimize environmental distractions;Slow rate;Small sips/bites Postural Changes and/or Swallow Maneuvers: Seated upright 90 degrees                Plan: All goals met       GO               Herbie Baltimore, MA CCC-SLP  Acute Rehabilitation Services Pager [phone] Office [phone]  [name] 05/06/2019, 1:45 PM

## 2019-05-06 NOTE — Progress Notes (Signed)
   05/06/19 0932  Family/Significant Other Communication  Family/Significant Other Update Other (Comment)  Pt doesn't want her family contacted. Pt states, "They know how to contact me if they are free and want to."

## 2019-05-06 NOTE — Plan of Care (Signed)
Went over POC. All questions asked and all questions answered. Needs met.  

## 2019-05-06 NOTE — Progress Notes (Signed)
Inpatient Diabetes Program Recommendations  AACE/ADA: New Consensus Statement on Inpatient Glycemic Control   Target Ranges:  Prepandial:   less than 140 mg/dL      Peak postprandial:   less than 180 mg/dL (1-2 hours)      Critically ill patients:  140 - 180 mg/dL  Results for [name], [name] (MRN [id number]) as of 05/06/2019 12:39  Ref. Range 05/05/2019 07:33 05/05/2019 11:10 05/05/2019 15:41 05/05/2019 19:53 05/06/2019 07:42 05/06/2019 12:04  Glucose-Capillary Latest Ref Range: 70 - 99 mg/dL 155 (H) 162 (H) 280 (H) 276 (H) 178 (H) 318 (H)    Review of Glycemic Control  Current orders for Inpatient glycemic control: Levemir 14 units BID, Novolog 4 units TID with meals, Novolog 0-20 units TID with meals, Novolog 0-5 units QHS, Decadron 6 mg Q12H  Inpatient Diabetes Program Recommendations:   Insulin - Meal Coverage: If Decadron is continued as ordered, please consider increasing meal coverage to Novolog 8 units TID with meals if patient eats at least 50% of meals.  Thanks, Barnie Alderman, RN, MSN, CDE Diabetes Coordinator Inpatient Diabetes Program [phone] (Team Pager from 8am to 5pm)

## 2019-05-07 LAB — CBC WITH DIFFERENTIAL/PLATELET
Abs Immature Granulocytes: 0.98 10*3/uL — ABNORMAL HIGH (ref 0.00–0.07)
Basophils Absolute: 0.1 10*3/uL (ref 0.0–0.1)
Basophils Relative: 0 %
Eosinophils Absolute: 0.1 10*3/uL (ref 0.0–0.5)
Eosinophils Relative: 0 %
HCT: 43.5 % (ref 36.0–46.0)
Hemoglobin: 14.2 g/dL (ref 12.0–15.0)
Immature Granulocytes: 4 %
Lymphocytes Relative: 10 %
Lymphs Abs: 2.6 10*3/uL (ref 0.7–4.0)
MCH: 30.7 pg (ref 26.0–34.0)
MCHC: 32.6 g/dL (ref 30.0–36.0)
MCV: 94.2 fL (ref 80.0–100.0)
Monocytes Absolute: 1.6 10*3/uL — ABNORMAL HIGH (ref 0.1–1.0)
Monocytes Relative: 6 %
Neutro Abs: 21.1 10*3/uL — ABNORMAL HIGH (ref 1.7–7.7)
Neutrophils Relative %: 80 %
Platelets: 307 10*3/uL (ref 150–400)
RBC: 4.62 MIL/uL (ref 3.87–5.11)
RDW: 14.4 % (ref 11.5–15.5)
WBC: 26.5 10*3/uL — ABNORMAL HIGH (ref 4.0–10.5)
nRBC: 0.1 % (ref 0.0–0.2)

## 2019-05-07 LAB — GLUCOSE, CAPILLARY
Glucose-Capillary: 382 mg/dL — ABNORMAL HIGH (ref 70–99)
Glucose-Capillary: 116 mg/dL — ABNORMAL HIGH (ref 70–99)
Glucose-Capillary: 215 mg/dL — ABNORMAL HIGH (ref 70–99)
Glucose-Capillary: 178 mg/dL — ABNORMAL HIGH (ref 70–99)
Glucose-Capillary: 128 mg/dL — ABNORMAL HIGH (ref 70–99)

## 2019-05-07 LAB — BASIC METABOLIC PANEL
Anion gap: 10 (ref 5–15)
BUN: 28 mg/dL — ABNORMAL HIGH (ref 8–23)
CO2: 34 mmol/L — ABNORMAL HIGH (ref 22–32)
Calcium: 8.8 mg/dL — ABNORMAL LOW (ref 8.9–10.3)
Chloride: 85 mmol/L — ABNORMAL LOW (ref 98–111)
Creatinine, Ser: 0.58 mg/dL (ref 0.44–1.00)
GFR calc Af Amer: >60 mL/min (ref >60)
GFR calc non Af Amer: >60 mL/min (ref >60)
Glucose, Bld: 140 mg/dL — ABNORMAL HIGH (ref 70–99)
Potassium: 4.2 mmol/L (ref 3.5–5.1)
Sodium: 129 mmol/L — ABNORMAL LOW (ref 135–145)

## 2019-05-07 LAB — CULTURE, RESPIRATORY W GRAM STAIN: Culture: NORMAL

## 2019-05-07 NOTE — Progress Notes (Signed)
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  She does have significant past medical history for hypertension, dyslipidemia, TIA, GERD, anxiety, depression and arthritis.  He tested positive for COVID-19 November 6.  [name] a nursing home resident.  Patient had a dry cough, she was quarantined at a skilled nursing facility.  On the day of hospitalization she was noted to be hypoxic, requiring supplemental oxygen per nasal cannula.  On her initial physical examination her oximetry was 76% on room air, blood pressure was 101/73, pulse rate 76, respirate 15, oxygen saturation 95% on 6 L of supplemental oxygen per nasal cannula.  She had coarse breath sounds bilaterally, heart S1-S2 present rhythmic, abdomen soft, no lower extremity edema.  Her chest radiograph had bilateral interstitial infiltrates, right upper lobe and left upper lobe.  Patient was admitted to the hospital working diagnosis of acute hypoxic respiratory failure due to SARS COVID-19 viral pneumonia.  Patient had increased oxygen requirements up to 15 L per nasal cannula, she has received remdesivir along with corticosteroids, and 1 dose of Actemra.  Her hospitalization has been complicated by tracheobronchitis, acute kidney injury, hypokalemia, hyponatremia and acute metabolic encephalopathy   Assessment & Plan:   Principal Problem:   Multifocal pneumonia Active Problems:   Depression   Acute respiratory failure with hypoxia (HCC)   Hyperlipidemia   Essential hypertension   Pneumonia due to COVID-19 virus   1. Acute hypoxic respiratory failure due to SARS COVID 19 viral pneumonia/ suspected bacterial coinfection. Patient received Actemra 11/23, completed remdesivir on 11/25. Dexamethasone #11.   RR: 14  Pulse oxymetry: 99 Fi02: 5 LPM per HFNC  Worsening leukocytosis  today up to 26.5, but not worsening oxygen requirements, will continue antibiotic therapy for now with Cefepime #3/8 and will follow on cell count in am. Continue as needed bronchodilator therapy and will follow chest film in am   Patient very weak and deconditioned will need SNF to continue reconditioning.   2. AKI with hypokalemia and hyponatremia. Stable renal function with serum cr at 0.58, with K at 4.2 and serum bicarbonate at 34. Will continue to follow on renal panel in am, patient with very poor oral intake.  3. Acute metabolic encephalopathy/ in the setting of depression. Patient's mentation has improved, no agitation or confusion, will discontinue haldol for now and continue neuro checks per unit protocol. Continue with sertraline and trazodone.   4. HTN. On amlodipine and metoprolol for blood pressure control.   5. T2DM with dyslipidemia. Fasting glucose this am is 140, will continue glucose cover and monitoring with insulin sliding scale, plus basal insulin of 14 units, and meal coverage 4 units. Continue statin therapy.   6. Obesity. Calculated BMI is 37.2  DVT prophylaxis: enoxaparin   Code Status:  full Family Communication: no family at the bedside  Disposition Plan/ discharge barriers: pending clinical improvement   Body mass index is 37.2 kg/m. Malnutrition Type:      Malnutrition Characteristics:      Nutrition Interventions:     RN Pressure Injury Documentation:     Consultants:     Procedures:     Antimicrobials:   Cefepime #3    Subjective: Dyspnea continue to improve but patient is very weak and deconditioned, poor appetite, no nausea or vomiting. Poor morbility.   Objective: Vitals:   05/07/19 0443  05/07/19 0535 05/07/19 0548 05/07/19 0802  BP: 135/73   (!) 142/77  Pulse: 77   73  Resp: 17   14  Temp: (!) 97 F (36.1 C)   97.6 F (36.4 C)  TempSrc: Axillary   Oral  SpO2: 90% 99% 97% 99%  Weight:      Height:         Intake/Output Summary (Last 24 hours) at 05/07/2019 [id number] Last data filed at 05/07/2019 0500 Gross per 24 hour  Intake 1489.65 ml  Output 2850 ml  Net -1360.35 ml   Filed Weights   05/03/2019 2200 05/02/19 0600  Weight: 102.1 kg 101.4 kg    Examination:   General: Not in pain or dyspnea, deconditioned and ill looking appearing  Neurology: Awake and alert, non focal  E ENT: mild pallor, no icterus, oral mucosa moist Cardiovascular: No JVD. S1-S2 present, rhythmic, no gallops, rubs, or murmurs. +++ non pitting bilateral lower extremity edema at the ankles.  Pulmonary: positive breath sounds bilaterally. Gastrointestinal. Abdomen with no organomegaly, non tender, no rebound or guarding Skin. No rashes Musculoskeletal: no joint deformities     Data Reviewed: I have personally reviewed following labs and imaging studies  CBC: Recent Labs  Lab 05/02/19 0644 05/03/19 0543 05/05/19 0530 05/06/19 0500 05/07/19 0500  WBC 16.7* 18.8* 22.1* 19.2* 26.5*  NEUTROABS  --   --   --   --  21.1*  HGB 14.0 14.3 14.7 14.4 14.2  HCT 43.5 42.9 44.6 43.4 43.5  MCV 95.0 94.3 95.1 95.2 94.2  PLT 427* 374 321 267 [id number]   Basic Metabolic Panel: Recent Labs  Lab 05/03/19 0543 05/04/19 0816 05/05/19 0530 05/06/19 0500 05/07/19 0500  NA 131* 132* 133* 133* 129*  K 3.8 4.4 4.4 4.1 4.2  CL 87* 86* 87* 88* 85*  CO2 34* 35* 35* 34* 34*  GLUCOSE 262* 211* 139* 201* 140*  BUN 27* 25* 28* 30* 28*  CREATININE 0.62 0.61 0.64 0.66 0.58  CALCIUM 8.6* 9.0 8.8* 8.9 8.8*   GFR: Estimated Creatinine Clearance: 69.5 mL/min (by C-G formula based on SCr of 0.58 mg/dL). Liver Function Tests: Recent Labs  Lab 05/01/19 0904 05/05/19 0530 05/06/19 0500  AST 33 58* 46*  ALT 18 31 35  ALKPHOS 99 114 90  BILITOT 1.0 0.7 0.7  PROT 6.4* 5.6* 5.6*  ALBUMIN 2.9* 2.5* 2.5*   No results for input(s): LIPASE, AMYLASE in the last 168 hours. No results for input(s): AMMONIA in the last 168 hours. Coagulation  Profile: No results for input(s): INR, PROTIME in the last 168 hours. Cardiac Enzymes: No results for input(s): CKTOTAL, CKMB, CKMBINDEX, TROPONINI in the last 168 hours. BNP (last 3 results) No results for input(s): PROBNP in the last 8760 hours. HbA1C: No results for input(s): HGBA1C in the last 72 hours. CBG: Recent Labs  Lab 05/05/19 1953 05/06/19 0742 05/06/19 1204 05/06/19 2020 05/07/19 0801  GLUCAP 276* 178* 318* 269* 116*   Lipid Profile: No results for input(s): CHOL, HDL, LDLCALC, TRIG, CHOLHDL, LDLDIRECT in the last 72 hours. Thyroid Function Tests: No results for input(s): TSH, T4TOTAL, FREET4, T3FREE, THYROIDAB in the last 72 hours. Anemia Panel: No results for input(s): VITAMINB12, FOLATE, FERRITIN, TIBC, IRON, RETICCTPCT in the last 72 hours.    Radiology Studies: I have reviewed all of the imaging during this hospital visit personally     Scheduled Meds: . amLODipine  10 mg Oral QHS  . aspirin EC  325 mg Oral Daily  .  atorvastatin  40 mg Oral q1800  . Chlorhexidine Gluconate Cloth  6 each Topical Daily  . cholecalciferol  1,000 Units Oral Daily  . enoxaparin (LOVENOX) injection  0.5 mg/kg Subcutaneous Q24H  . gabapentin  600 mg Oral TID WC & HS  . insulin aspart  0-20 Units Subcutaneous TID WC  . insulin aspart  0-5 Units Subcutaneous QHS  . insulin aspart  4 Units Subcutaneous TID WC  . insulin detemir  14 Units Subcutaneous BID  . Ipratropium-Albuterol  1 puff Inhalation Q6H  . mouth rinse  15 mL Mouth Rinse BID  . metoprolol succinate  100 mg Oral Daily  . metoprolol succinate  50 mg Oral Daily  . pantoprazole  40 mg Oral Daily  . pneumococcal 23 valent vaccine  0.5 mL Intramuscular Tomorrow-1000  . pyridOXINE  50 mg Oral Daily  . sertraline  50 mg Oral Daily  . sodium chloride flush  10-40 mL Intracatheter Q12H  . sodium chloride flush  10-40 mL Intracatheter Q12H  . traZODone  50 mg Oral QHS  . vitamin C  500 mg Oral Daily  . zinc sulfate   220 mg Oral Daily   Continuous Infusions: . ceFEPime (MAXIPIME) IV 2 g (05/07/19 0548)     LOS: 12 days        Nicklaus Alviar Gerome Apley, MD

## 2019-05-07 NOTE — Progress Notes (Signed)
   05/07/19 0957  Family/Significant Other Communication  Family/Significant Other Update Other (Comment)  Pt denies wanting to have family contacted. States, "I let them know what I want them to know."

## 2019-05-07 NOTE — Progress Notes (Signed)
PT Cancellation Note  Patient Details Name: [name] MRN: [id number] DOB: September 07, 1940   Cancelled Treatment:    Reason Eval/Treat Not Completed: Medical issues which prohibited therapy, patient reports being too SOB to mobilize. Check back another time.   Claretha Cooper 05/07/2019, 4:07 PM  [place] Pager [phone] Office [phone]

## 2019-05-07 NOTE — Plan of Care (Signed)
  Problem: Coping: Goal: Psychosocial and spiritual needs will be supported Outcome: Progressing   Problem: Respiratory: Goal: Will maintain a patent airway Outcome: Progressing Goal: Complications related to the disease process, condition or treatment will be avoided or minimized Outcome: Progressing   Problem: Clinical Measurements: Goal: Ability to maintain clinical measurements within normal limits will improve Outcome: Progressing Goal: Will remain free from infection Outcome: Progressing Goal: Respiratory complications will improve Outcome: Progressing Goal: Cardiovascular complication will be avoided Outcome: Progressing   Problem: Nutrition: Goal: Adequate nutrition will be maintained Outcome: Progressing   Problem: Coping: Goal: Level of anxiety will decrease Outcome: Progressing   Problem: Elimination: Goal: Will not experience complications related to urinary retention Outcome: Progressing   Problem: Pain Managment: Goal: General experience of comfort will improve Outcome: Progressing   Problem: Safety: Goal: Ability to remain free from injury will improve Outcome: Progressing

## 2019-05-07 NOTE — Plan of Care (Signed)
Went over POC. All questions asked and all questions answered. Needs met.  

## 2019-05-07 NOTE — Progress Notes (Signed)
Patient declining RN to call and update family

## 2019-05-08 ENCOUNTER — Inpatient Hospital Stay (HOSPITAL_COMMUNITY): Payer: Medicare HMO

## 2019-05-08 DIAGNOSIS — E871 Hypo-osmolality and hyponatremia: Secondary | ICD-10-CM

## 2019-05-08 LAB — CBC WITH DIFFERENTIAL/PLATELET
Abs Immature Granulocytes: 0.92 10*3/uL — ABNORMAL HIGH (ref 0.00–0.07)
Abs Immature Granulocytes: 0.67 10*3/uL — ABNORMAL HIGH (ref 0.00–0.07)
Basophils Absolute: 0.1 10*3/uL (ref 0.0–0.1)
Basophils Absolute: 0.1 10*3/uL (ref 0.0–0.1)
Basophils Relative: 0 %
Basophils Relative: 0 %
Eosinophils Absolute: 0.7 10*3/uL — ABNORMAL HIGH (ref 0.0–0.5)
Eosinophils Absolute: 0.7 10*3/uL — ABNORMAL HIGH (ref 0.0–0.5)
Eosinophils Relative: 3 %
Eosinophils Relative: 4 %
HCT: 41.5 % (ref 36.0–46.0)
HCT: 39.6 % (ref 36.0–46.0)
Hemoglobin: 13.4 g/dL (ref 12.0–15.0)
Hemoglobin: 13.1 g/dL (ref 12.0–15.0)
Immature Granulocytes: 4 %
Immature Granulocytes: 3 %
Lymphocytes Relative: 18 %
Lymphocytes Relative: 15 %
Lymphs Abs: 3.7 10*3/uL (ref 0.7–4.0)
Lymphs Abs: 3.1 10*3/uL (ref 0.7–4.0)
MCH: 30.7 pg (ref 26.0–34.0)
MCH: 31.5 pg (ref 26.0–34.0)
MCHC: 32.3 g/dL (ref 30.0–36.0)
MCHC: 33.1 g/dL (ref 30.0–36.0)
MCV: 95.2 fL (ref 80.0–100.0)
MCV: 95.2 fL (ref 80.0–100.0)
Monocytes Absolute: 1.4 10*3/uL — ABNORMAL HIGH (ref 0.1–1.0)
Monocytes Absolute: 1.2 10*3/uL — ABNORMAL HIGH (ref 0.1–1.0)
Monocytes Relative: 7 %
Monocytes Relative: 6 %
Neutro Abs: 14.3 10*3/uL — ABNORMAL HIGH (ref 1.7–7.7)
Neutro Abs: 14.3 10*3/uL — ABNORMAL HIGH (ref 1.7–7.7)
Neutrophils Relative %: 68 %
Neutrophils Relative %: 72 %
Platelets: 274 10*3/uL (ref 150–400)
Platelets: 256 10*3/uL (ref 150–400)
RBC: 4.36 MIL/uL (ref 3.87–5.11)
RBC: 4.16 MIL/uL (ref 3.87–5.11)
RDW: 14.5 % (ref 11.5–15.5)
RDW: 14.6 % (ref 11.5–15.5)
WBC: 21 10*3/uL — ABNORMAL HIGH (ref 4.0–10.5)
WBC: 20.1 10*3/uL — ABNORMAL HIGH (ref 4.0–10.5)
nRBC: 0.3 % — ABNORMAL HIGH (ref 0.0–0.2)
nRBC: 0.3 % — ABNORMAL HIGH (ref 0.0–0.2)

## 2019-05-08 LAB — GLUCOSE, CAPILLARY
Glucose-Capillary: 70 mg/dL (ref 70–99)
Glucose-Capillary: 71 mg/dL (ref 70–99)
Glucose-Capillary: 105 mg/dL — ABNORMAL HIGH (ref 70–99)
Glucose-Capillary: 160 mg/dL — ABNORMAL HIGH (ref 70–99)
Glucose-Capillary: 160 mg/dL — ABNORMAL HIGH (ref 70–99)

## 2019-05-08 LAB — BASIC METABOLIC PANEL
Anion gap: 11 (ref 5–15)
BUN: 29 mg/dL — ABNORMAL HIGH (ref 8–23)
CO2: 32 mmol/L (ref 22–32)
Calcium: 8.7 mg/dL — ABNORMAL LOW (ref 8.9–10.3)
Chloride: 84 mmol/L — ABNORMAL LOW (ref 98–111)
Creatinine, Ser: 0.63 mg/dL (ref 0.44–1.00)
GFR calc Af Amer: >60 mL/min (ref >60)
GFR calc non Af Amer: >60 mL/min (ref >60)
Glucose, Bld: 67 mg/dL — ABNORMAL LOW (ref 70–99)
Potassium: 4.3 mmol/L (ref 3.5–5.1)
Sodium: 127 mmol/L — ABNORMAL LOW (ref 135–145)

## 2019-05-08 LAB — NA AND K (SODIUM & POTASSIUM), RAND UR
Potassium Urine: 25 mmol/L
Sodium, Ur: <10 mmol/L

## 2019-05-08 LAB — OSMOLALITY, URINE: Osmolality, Ur: 278 mOsm/kg — ABNORMAL LOW (ref 300–900)

## 2019-05-08 LAB — CHLORIDE, URINE, RANDOM: Chloride Urine: <15 mmol/L

## 2019-05-08 MED ORDER — MAGIC MOUTHWASH W/LIDOCAINE
5.0000 mL | Freq: Three times a day (TID) | ORAL | Status: DC | PRN
  Administered 2019-05-08 – 2019-05-12 (×4): 5 mL via ORAL
  Filled 2019-05-08 (×7): qty 5

## 2019-05-08 MED ORDER — SODIUM CHLORIDE 1 G PO TABS
1.0000 g | ORAL_TABLET | Freq: Three times a day (TID) | ORAL | Status: DC
  Administered 2019-05-08 – 2019-05-10 (×6): 1 g via ORAL
  Filled 2019-05-08 (×8): qty 1

## 2019-05-08 MED ORDER — INSULIN DETEMIR 100 UNIT/ML ~~LOC~~ SOLN
10.0000 [IU] | Freq: Every day | SUBCUTANEOUS | Status: DC
  Administered 2019-05-09 – 2019-05-12 (×4): 10 [IU] via SUBCUTANEOUS
  Filled 2019-05-08 (×4): qty 0.1

## 2019-05-08 NOTE — Progress Notes (Signed)
Pharmacy Antibiotic Note  [name] is a 78 y.o. female admitted on 04/29/2019 with respiratory failure, now with WBC 21 and increased sputum production.  Pharmacy has been consulted for cefepime dosing. SCr 0.63, CrCl ~ 69 ml/min  Plan: Continue Cefepime 2g IV every 8 hours for a total of 8 days per MD note.  Monitor renal function, sputum Cx and clinical progression to narrow  Height: 5\' 5"  (165.1 cm) Weight: 223 lb 8.7 oz (101.4 kg) IBW/kg (Calculated) : 57  Temp (24hrs), Avg:97.5 F (36.4 C), Min:97 F (36.1 C), Max:97.8 F (36.6 C)  Recent Labs  Lab 05/03/19 0543 05/04/19 0816 05/05/19 0530 05/06/19 0500 05/07/19 0500 05/08/19 0610  WBC 18.8*  --  22.1* 19.2* 26.5* 21.0*  CREATININE 0.62 0.61 0.64 0.66 0.58 0.63    Estimated Creatinine Clearance: 69.5 mL/min (by C-G formula based on SCr of 0.63 mg/dL).    Allergies  Allergen Reactions  . Contrast Media [Iodinated Diagnostic Agents] Anaphylaxis    Pt denies allergy as of 08/13/2018.   . Sulfa Drugs Cross Reactors Nausea And Vomiting    Antimicrobials this admission: 11/21 Vancomycin >>11/24 11/21 Cefepime >>11/24, 12/1>>(12/8) 11/21 remdesivir>>11/25 11/23 Actemra x 1 11/23  Microbiology results: 11/21BCx: Negative 11/21 COVID: positive 12/1 sputum: sent  Toy Samarin A. Levada Dy, PharmD, BCPS, FNKF Clinical Pharmacist [place] Please utilize Amion for appropriate phone number to reach the unit pharmacist ([place])   05/08/2019 12:19 PM

## 2019-05-08 NOTE — Progress Notes (Addendum)
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 She does have significant past medical history for hypertension, dyslipidemia, TIA, GERD, anxiety, depression and arthritis. He tested positive for COVID-19 November 6. [name] a nursing home resident.Patient had a dry cough, she was quarantined at a skilled nursing facility. On the day of hospitalization she was noted to be hypoxic, requiring supplemental oxygen per nasal cannula. On her initial physical examination her oximetry was 76% on room air, blood pressure was 101/73, pulse rate 76, respirate 15, oxygen saturation 95% on 6 L of supplemental oxygen per nasal cannula.She had coarse breath sounds bilaterally, heart S1-S2 present rhythmic, abdomen soft, no lower extremity edema. Her chest radiograph had bilateral interstitial infiltrates, right upper lobe and left upper lobe.  Patient was admitted to the hospital working diagnosis of acute hypoxic respiratory failure due to SARS COVID-19 viral pneumonia.  Patient had increased oxygen requirements up to 15 L per nasal cannula, she has received remdesivir along with corticosteroids, and 1 dose of Actemra.  Her hospitalization has been complicated by tracheobronchitis, acute kidney injury, hypokalemia, hyponatremia and acute metabolic encephalopathy   Assessment & Plan:   Principal Problem:   Multifocal pneumonia Active Problems:   Depression   Acute respiratory failure with hypoxia (HCC)   Hyperlipidemia   Essential hypertension   Pneumonia due to COVID-19 virus    1. Acute hypoxic respiratory failure due to SARS COVID 19 viral pneumonia/complicated with bacterial coinfection. Patient received Actemra 11/23, completed remdesivir on 11/25.Dexamethasone #11.   RR: 14  Pulse oxymetry: 99 Fi02: 5 LPM per HFNC  Follow up  chest radiograph with predominant left lower lobe infiltrate, able to see air bronchogram. Her wbc is down to 21.0. will continue with Cefepime #4/8. On bronchodilator therapy, airway clearing techniques with flutter valve and incentive spirometer.   Out of bed as tolerated and physical/ occupational therapy.   2. AKI with hypokalemia and hyponatremia/ metabolic alkalosis. Serum sodium is down to 127 from 129, clinically euvolemic, will liberate her diet and follow on renal panel in am. Renal function stable with serum cr at 0.63, K at 4,3 and serum bicarbonate at 32. Add salt tablets. Will check serum osmolality, urine osmolality and urine na and cl.   3. Acute metabolic encephalopathy/ in the setting of depression. No further confusion or agitation, will continue with sertraline and trazodone.   4. HTN. Blood pressure control with  amlodipine and metoprolol.  5. T2DM with dyslipidemia. Fasting glucose this am is 67, capillary 70, 71 and 105, will decrease basal insulin to 10 units qhs, will hold on pre-meal coverage and will continue insulin sliding scale for glucose cover and monitoring.   6. Obesity. Her calculated BMI is 37.2  DVT prophylaxis:enoxaparin Code Status:full Family Communication:I spoke over the phone with the patient's son about patient's  condition, plan of care and all questions were addressed. Disposition Plan/ discharge barriers:pending clinical improvement, plan to return to SNF on 05/11/19     Body mass index is 37.2 kg/m. Malnutrition Type:      Malnutrition Characteristics:      Nutrition Interventions:     RN Pressure Injury Documentation:     Consultants:     Procedures:     Antimicrobials:   Cefepime.     Subjective: This am patient is out of bed to chair, continue to  be very weak and deconditioned, worsening dyspnea on exertion, persistent low appetite, no nausea or vomiting.   Objective: Vitals:   05/07/19 1927  05/07/19 2055 05/08/19 0425 05/08/19 0729  BP: (!) 143/52 (!) 128/50 117/65 (!) 114/45  Pulse: 79 80 73 68  Resp: (!) 22 19 18 18   Temp: 97.8 F (36.6 C) (!) 97 F (36.1 C) (!) 97.3 F (36.3 C) 97.8 F (36.6 C)  TempSrc: Axillary Axillary Axillary Axillary  SpO2: 96% 90% 94% 91%  Weight:      Height:        Intake/Output Summary (Last 24 hours) at 05/08/2019 0857 Last data filed at 05/08/2019 0602 Gross per 24 hour  Intake 930 ml  Output 1250 ml  Net -320 ml   Filed Weights   04/18/2019 2200 05/02/19 0600  Weight: 102.1 kg 101.4 kg    Examination:   General: Not in pain or dyspnea, deconditioned  Neurology: Awake and alert, non focal  E ENT: mild pallor, no icterus, oral mucosa moist Cardiovascular: No JVD. S1-S2 present, rhythmic, no gallops, rubs, or murmurs. Bilateral lower extremities, non pitting edema.  Pulmonary: positive breath sounds bilaterally. Gastrointestinal. Abdomen with no organomegaly, non tender, no rebound or guarding Skin. No rashes Musculoskeletal: no joint deformities     Data Reviewed: I have personally reviewed following labs and imaging studies  CBC: Recent Labs  Lab 05/03/19 0543 05/05/19 0530 05/06/19 0500 05/07/19 0500 05/08/19 0610  WBC 18.8* 22.1* 19.2* 26.5* 21.0*  NEUTROABS  --   --   --  21.1* 14.3*  HGB 14.3 14.7 14.4 14.2 13.4  HCT 42.9 44.6 43.4 43.5 41.5  MCV 94.3 95.1 95.2 94.2 95.2  PLT 374 321 267 307 [id number]   Basic Metabolic Panel: Recent Labs  Lab 05/04/19 0816 05/05/19 0530 05/06/19 0500 05/07/19 0500 05/08/19 0610  NA 132* 133* 133* 129* 127*  K 4.4 4.4 4.1 4.2 4.3  CL 86* 87* 88* 85* 84*  CO2 35* 35* 34* 34* 32  GLUCOSE 211* 139* 201* 140* 67*  BUN 25* 28* 30* 28* 29*  CREATININE 0.61 0.64 0.66 0.58 0.63  CALCIUM 9.0 8.8* 8.9 8.8* 8.7*   GFR: Estimated Creatinine Clearance: 69.5 mL/min (by C-G formula based on SCr of 0.63 mg/dL). Liver Function Tests: Recent Labs  Lab 05/01/19 0904 05/05/19 0530  05/06/19 0500  AST 33 58* 46*  ALT 18 31 35  ALKPHOS 99 114 90  BILITOT 1.0 0.7 0.7  PROT 6.4* 5.6* 5.6*  ALBUMIN 2.9* 2.5* 2.5*   No results for input(s): LIPASE, AMYLASE in the last 168 hours. No results for input(s): AMMONIA in the last 168 hours. Coagulation Profile: No results for input(s): INR, PROTIME in the last 168 hours. Cardiac Enzymes: No results for input(s): CKTOTAL, CKMB, CKMBINDEX, TROPONINI in the last 168 hours. BNP (last 3 results) No results for input(s): PROBNP in the last 8760 hours. HbA1C: No results for input(s): HGBA1C in the last 72 hours. CBG: Recent Labs  Lab 05/07/19 0801 05/07/19 1201 05/07/19 1604 05/07/19 2056 05/08/19 0751  GLUCAP 116* 215* 178* 128* 70   Lipid Profile: No results for input(s): CHOL, HDL, LDLCALC, TRIG, CHOLHDL, LDLDIRECT in the last 72 hours. Thyroid Function Tests: No results for input(s): TSH, T4TOTAL, FREET4, T3FREE, THYROIDAB in the last 72 hours. Anemia Panel: No results for input(s): VITAMINB12, FOLATE, FERRITIN, TIBC, IRON, RETICCTPCT in the last 72 hours.    Radiology Studies: I have reviewed all of the imaging during this hospital visit personally  Scheduled Meds: . amLODipine  10 mg Oral QHS  . aspirin EC  325 mg Oral Daily  . atorvastatin  40 mg Oral q1800  . Chlorhexidine Gluconate Cloth  6 each Topical Daily  . cholecalciferol  1,000 Units Oral Daily  . enoxaparin (LOVENOX) injection  0.5 mg/kg Subcutaneous Q24H  . gabapentin  600 mg Oral TID WC & HS  . insulin aspart  0-20 Units Subcutaneous TID WC  . insulin aspart  0-5 Units Subcutaneous QHS  . insulin aspart  4 Units Subcutaneous TID WC  . insulin detemir  14 Units Subcutaneous BID  . Ipratropium-Albuterol  1 puff Inhalation Q6H  . mouth rinse  15 mL Mouth Rinse BID  . metoprolol succinate  100 mg Oral Daily  . metoprolol succinate  50 mg Oral Daily  . pantoprazole  40 mg Oral Daily  . pneumococcal 23 valent vaccine  0.5 mL  Intramuscular Tomorrow-1000  . pyridOXINE  50 mg Oral Daily  . sertraline  50 mg Oral Daily  . sodium chloride flush  10-40 mL Intracatheter Q12H  . sodium chloride flush  10-40 mL Intracatheter Q12H  . traZODone  50 mg Oral QHS  . vitamin C  500 mg Oral Daily  . zinc sulfate  220 mg Oral Daily   Continuous Infusions: . ceFEPime (MAXIPIME) IV 2 g (05/08/19 0602)     LOS: 13 days        Allyanna Appleman Gerome Apley, MD

## 2019-05-09 LAB — BASIC METABOLIC PANEL
Anion gap: 9 (ref 5–15)
BUN: 22 mg/dL (ref 8–23)
CO2: 32 mmol/L (ref 22–32)
Calcium: 8.6 mg/dL — ABNORMAL LOW (ref 8.9–10.3)
Chloride: 90 mmol/L — ABNORMAL LOW (ref 98–111)
Creatinine, Ser: 0.56 mg/dL (ref 0.44–1.00)
GFR calc Af Amer: >60 mL/min (ref >60)
GFR calc non Af Amer: >60 mL/min (ref >60)
Glucose, Bld: 125 mg/dL — ABNORMAL HIGH (ref 70–99)
Potassium: 4.6 mmol/L (ref 3.5–5.1)
Sodium: 131 mmol/L — ABNORMAL LOW (ref 135–145)

## 2019-05-09 LAB — GLUCOSE, CAPILLARY
Glucose-Capillary: 80 mg/dL (ref 70–99)
Glucose-Capillary: 81 mg/dL (ref 70–99)
Glucose-Capillary: 144 mg/dL — ABNORMAL HIGH (ref 70–99)

## 2019-05-09 LAB — OSMOLALITY: Osmolality: 280 mOsm/kg (ref 275–295)

## 2019-05-09 NOTE — Progress Notes (Signed)
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 He tested positive for COVID-19 November 6. [name] a nursing home resident.Patient had a dry cough, she was quarantined at a skilled nursing facility. On the day of hospitalization she was noted to be hypoxic, requiring supplemental oxygen per nasal cannula. On her initial physical examination her oximetry was 76% on room air, blood pressure was 101/73, pulse rate 76, respirate 15, oxygen saturation 95% on 6 L of supplemental oxygen per nasal cannula.She had coarse breath sounds bilaterally, heart S1-S2 present rhythmic, abdomen soft, no lower extremity edema. Her chest radiograph had bilateral interstitial infiltrates, right upper lobe and left upper lobe.  Patient was admitted to the hospital working diagnosis of acute hypoxic respiratory failure due to SARS COVID-19 viral pneumonia.  Patient had increased oxygen requirements up to 15 L per nasal cannula, she has received remdesivir along with corticosteroids, and 1 dose of Actemra.  Her hospitalization has been complicated by tracheobronchitis, acute kidney injury, hypokalemia, hyponatremia and acute metabolic encephalopathy  Patient clinically slowly improving, continue to be very weak and deconditioned.   Follow up chest radiograph with predominant left lower lobe infiltrate, able to see air bronchogram  Assessment & Plan:   Principal Problem:   Multifocal pneumonia Active Problems:   Depression   Acute respiratory failure with hypoxia (HCC)   Hyperlipidemia   Essential hypertension   Pneumonia due to COVID-19 virus   1. Acute hypoxic respiratory failure due to SARS COVID 19 viral  pneumonia/complicated with bacterial coinfection. Patient received Actemra 11/23, completed remdesivir on 11/25.Dexamethasone #11.  Patient with stable oxygen requirements, to day with 6 LPM per HFNC, will continue bronchodilator therapy, airway clearing techniques with flutter valve and incentive spirometer. Taper 02 as tolerated, target 02 saturation greater than 88%.   Continue antibiotic therapy with cefepime #5/8. Sputum culture with no significant growth.   2. AKI with hypokalemia and hyponatremia/ metabolic alkalosis.NA has improved, now up to 131, his urinary Na and Cl are low, for now will continue salt tablets for now, and will allow patient free water intake.  3. Acute metabolic encephalopathy/ in the setting of depression.Continue with Sertraline and trazodone.Continue to encourage out of bed and physical therapy.  4. HTN.On amlodipine and metoprolol for blood pressure control.   5. T2DM with dyslipidemia.Fasting glucose this am is 125, will continue with basal insulin to 10 units qhs and insulin sliding scale for glucose cover and monitoring. Patient is tolerating po well, poor appetite.   6. Obesity. BMI is 37.2  DVT prophylaxis:enoxaparin Code Status:full Family Communication:no family at the bedside Disposition Plan/ discharge barriers:pending clinical improvement, plan to return to SNF on 05/11/19   Body mass index is 37.2 kg/m. Malnutrition Type:      Malnutrition Characteristics:      Nutrition Interventions:     RN Pressure Injury Documentation:     Consultants:     Procedures:     Antimicrobials:       Subjective: Patient continue to be very weak and deconditioned, no nausea or vomitinh, no chest pain, continue to feel very weak and deconditioned. Planning to go to chair this pm after lunch.   Objective: Vitals:   05/08/19 1934 05/09/19 0328 05/09/19 0344 05/09/19 0715  BP: Marland Kitchen)  122/103 107/82  119/75  Pulse: 84 74   71  Resp: 20 14  19   Temp: 97.8 F (36.6 C)  (!) 96.6 F (35.9 C)   TempSrc: Axillary  Axillary   SpO2: 93% (!) 89%  93%  Weight:      Height:        Intake/Output Summary (Last 24 hours) at 05/09/2019 0827 Last data filed at 05/09/2019 [id number] Gross per 24 hour  Intake 1190 ml  Output 800 ml  Net 390 ml   Filed Weights   04/30/2019 2200 05/02/19 0600  Weight: 102.1 kg 101.4 kg    Examination:   General: Not in pain, deconditioned  Neurology: Awake and alert, non focal  E ENT: mild pallor, no icterus, oral mucosa moist Cardiovascular: No JVD. S1-S2 present, rhythmic, no gallops, rubs, or murmurs. Non pitting lower extremity edema. Pulmonary: positive breath sounds bilaterally. Gastrointestinal. Abdomen with no organomegaly, non tender, no rebound or guarding Skin. No rashes Musculoskeletal: no joint deformities     Data Reviewed: I have personally reviewed following labs and imaging studies  CBC: Recent Labs  Lab 05/05/19 0530 05/06/19 0500 05/07/19 0500 05/08/19 0610 05/08/19 1700  WBC 22.1* 19.2* 26.5* 21.0* 20.1*  NEUTROABS  --   --  21.1* 14.3* 14.3*  HGB 14.7 14.4 14.2 13.4 13.1  HCT 44.6 43.4 43.5 41.5 39.6  MCV 95.1 95.2 94.2 95.2 95.2  PLT 321 267 307 274 [id number]   Basic Metabolic Panel: Recent Labs  Lab 05/04/19 0816 05/05/19 0530 05/06/19 0500 05/07/19 0500 05/08/19 0610  NA 132* 133* 133* 129* 127*  K 4.4 4.4 4.1 4.2 4.3  CL 86* 87* 88* 85* 84*  CO2 35* 35* 34* 34* 32  GLUCOSE 211* 139* 201* 140* 67*  BUN 25* 28* 30* 28* 29*  CREATININE 0.61 0.64 0.66 0.58 0.63  CALCIUM 9.0 8.8* 8.9 8.8* 8.7*   GFR: Estimated Creatinine Clearance: 69.5 mL/min (by C-G formula based on SCr of 0.63 mg/dL). Liver Function Tests: Recent Labs  Lab 05/05/19 0530 05/06/19 0500  AST 58* 46*  ALT 31 35  ALKPHOS 114 90  BILITOT 0.7 0.7  PROT 5.6* 5.6*  ALBUMIN 2.5* 2.5*   No results for input(s): LIPASE, AMYLASE in the last 168 hours. No results for input(s):  AMMONIA in the last 168 hours. Coagulation Profile: No results for input(s): INR, PROTIME in the last 168 hours. Cardiac Enzymes: No results for input(s): CKTOTAL, CKMB, CKMBINDEX, TROPONINI in the last 168 hours. BNP (last 3 results) No results for input(s): PROBNP in the last 8760 hours. HbA1C: No results for input(s): HGBA1C in the last 72 hours. CBG: Recent Labs  Lab 05/08/19 0957 05/08/19 1133 05/08/19 1533 05/08/19 2116 05/09/19 0744  GLUCAP 71 105* 160* 160* 80   Lipid Profile: No results for input(s): CHOL, HDL, LDLCALC, TRIG, CHOLHDL, LDLDIRECT in the last 72 hours. Thyroid Function Tests: No results for input(s): TSH, T4TOTAL, FREET4, T3FREE, THYROIDAB in the last 72 hours. Anemia Panel: No results for input(s): VITAMINB12, FOLATE, FERRITIN, TIBC, IRON, RETICCTPCT in the last 72 hours.    Radiology Studies: I have reviewed all of the imaging during this hospital visit personally     Scheduled Meds: . amLODipine  10 mg Oral QHS  . aspirin EC  325 mg Oral Daily  . atorvastatin  40 mg Oral q1800  . Chlorhexidine Gluconate Cloth  6 each Topical Daily  . cholecalciferol  1,000 Units Oral Daily  . enoxaparin (LOVENOX) injection  0.5 mg/kg  Subcutaneous Q24H  . gabapentin  600 mg Oral TID WC & HS  . insulin aspart  0-20 Units Subcutaneous TID WC  . insulin aspart  0-5 Units Subcutaneous QHS  . insulin detemir  10 Units Subcutaneous QHS  . Ipratropium-Albuterol  1 puff Inhalation Q6H  . mouth rinse  15 mL Mouth Rinse BID  . metoprolol succinate  100 mg Oral Daily  . metoprolol succinate  50 mg Oral Daily  . pantoprazole  40 mg Oral Daily  . pneumococcal 23 valent vaccine  0.5 mL Intramuscular Tomorrow-1000  . pyridOXINE  50 mg Oral Daily  . sertraline  50 mg Oral Daily  . sodium chloride flush  10-40 mL Intracatheter Q12H  . sodium chloride flush  10-40 mL Intracatheter Q12H  . sodium chloride  1 g Oral TID WC  . traZODone  50 mg Oral QHS  . vitamin C  500 mg  Oral Daily  . zinc sulfate  220 mg Oral Daily   Continuous Infusions: . ceFEPime (MAXIPIME) IV 2 g (05/09/19 0552)     LOS: 14 days        Nydia Ytuarte Gerome Apley, MD

## 2019-05-09 NOTE — Progress Notes (Signed)
Pt refused to get up out of bed today. Pt was educated on importance of getting out of bed and sitting in the chair and still refused and cried saying that she "just didn't feel good". Pt also educated on the importance of turning on her side in the bed for better oxygenation, and she still refused. Had to put patient back on 6L HFNC, will try to wean accordingly.

## 2019-05-10 LAB — CBC WITH DIFFERENTIAL/PLATELET
Abs Immature Granulocytes: 0.32 10*3/uL — ABNORMAL HIGH (ref 0.00–0.07)
Basophils Absolute: 0 10*3/uL (ref 0.0–0.1)
Basophils Relative: 0 %
Eosinophils Absolute: 0.8 10*3/uL — ABNORMAL HIGH (ref 0.0–0.5)
Eosinophils Relative: 5 %
HCT: 37.6 % (ref 36.0–46.0)
Hemoglobin: 11.9 g/dL — ABNORMAL LOW (ref 12.0–15.0)
Immature Granulocytes: 2 %
Lymphocytes Relative: 17 %
Lymphs Abs: 2.5 10*3/uL (ref 0.7–4.0)
MCH: 31 pg (ref 26.0–34.0)
MCHC: 31.6 g/dL (ref 30.0–36.0)
MCV: 97.9 fL (ref 80.0–100.0)
Monocytes Absolute: 0.8 10*3/uL (ref 0.1–1.0)
Monocytes Relative: 6 %
Neutro Abs: 10.2 10*3/uL — ABNORMAL HIGH (ref 1.7–7.7)
Neutrophils Relative %: 70 %
Platelets: 219 10*3/uL (ref 150–400)
RBC: 3.84 MIL/uL — ABNORMAL LOW (ref 3.87–5.11)
RDW: 15.2 % (ref 11.5–15.5)
WBC: 14.7 10*3/uL — ABNORMAL HIGH (ref 4.0–10.5)
nRBC: 0.3 % — ABNORMAL HIGH (ref 0.0–0.2)

## 2019-05-10 LAB — GLUCOSE, CAPILLARY
Glucose-Capillary: 108 mg/dL — ABNORMAL HIGH (ref 70–99)
Glucose-Capillary: 94 mg/dL (ref 70–99)
Glucose-Capillary: 122 mg/dL — ABNORMAL HIGH (ref 70–99)
Glucose-Capillary: 167 mg/dL — ABNORMAL HIGH (ref 70–99)

## 2019-05-10 LAB — BASIC METABOLIC PANEL
Anion gap: 9 (ref 5–15)
BUN: 16 mg/dL (ref 8–23)
CO2: 30 mmol/L (ref 22–32)
Calcium: 8.7 mg/dL — ABNORMAL LOW (ref 8.9–10.3)
Chloride: 90 mmol/L — ABNORMAL LOW (ref 98–111)
Creatinine, Ser: 0.56 mg/dL (ref 0.44–1.00)
GFR calc Af Amer: >60 mL/min (ref >60)
GFR calc non Af Amer: >60 mL/min (ref >60)
Glucose, Bld: 167 mg/dL — ABNORMAL HIGH (ref 70–99)
Potassium: 4.8 mmol/L (ref 3.5–5.1)
Sodium: 129 mmol/L — ABNORMAL LOW (ref 135–145)

## 2019-05-10 MED ORDER — SODIUM CHLORIDE 0.9 % IV SOLN
INTRAVENOUS | Status: DC
  Administered 2019-05-10: 15:00:00 via INTRAVENOUS

## 2019-05-10 NOTE — Progress Notes (Signed)
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 He tested positive for COVID-19 November 6. [name] a nursing home resident.Patient had a dry cough, she was quarantined at a skilled nursing facility. On the day of hospitalization she was noted to be hypoxic, requiring supplemental oxygen per nasal cannula. On her initial physical examination her oximetry was 76% on room air, blood pressure was 101/73, pulse rate 76, respirate 15, oxygen saturation 95% on 6 L of supplemental oxygen per nasal cannula.She had coarse breath sounds bilaterally, heart S1-S2 present rhythmic, abdomen soft, no lower extremity edema. Her chest radiograph had bilateral interstitial infiltrates, right upper lobe and left upper lobe.  Patient was admitted to the hospital working diagnosis of acute hypoxic respiratory failure due to SARS COVID-19 viral pneumonia.  Patient had increased oxygen requirements up to 15 L per nasal cannula, she has received remdesivir along with corticosteroids, and 1 dose of Actemra.  Her hospitalization has been complicated by tracheobronchitis, acute kidney injury, hypokalemia, hyponatremia and acute metabolic encephalopathy  Patient clinically slowly improving, continue to be very weak and deconditioned.   Follow up chest radiograph with predominant left lower lobe infiltrate, able to see air bronchogram   Assessment & Plan:   Principal Problem:   Multifocal pneumonia Active Problems:   Depression   Acute respiratory failure with hypoxia (HCC)   Hyperlipidemia   Essential hypertension   Pneumonia due to COVID-19 virus    1. Acute hypoxic respiratory failure due to SARS COVID 19 viral  pneumonia/complicated withbacterial coinfection. Patient received Actemra 11/23, completed remdesivir on 11/25.Dexamethasone #11.  Today on 5 LPM per [place], on bronchodilator therapy, and airway clearing techniques with flutter valve and incentive spirometer.Patient continue to be very weak and deconditioned.  Tolerating well antibiotic therapy with cefepime #6/8. Wbc is down to 14 today.   2. AKI with hypokalemia and hyponatremia/ metabolic alkalosis.Patient with increased thirst, and oral ulcers, poor oral intake, increased free water intake. Will dc salt tablets and will add low dose isotonic saline. Her urine Na and Cl are low. Will follow renal panel in am.   3. Acute metabolic encephalopathy/ in the setting of depression.On Sertraline and trazodone.Encourage out of bed and physical therapy.  4. HTN.Continue blood pressure control with amlodipine and metoprolol.  5. T2DM with dyslipidemia.Fasting glucose this am is 167. On basal insulin to 10 units qhs and insulin sliding scale for glucose cover and monitoring.  6. Obesity.Calculated BMI is 37.2  DVT prophylaxis:enoxaparin Code Status:full Family Communication:no family at the bedside Disposition Plan/ discharge barriers:pending clinical improvement, plan to return to SNF on 05/11/19    Body mass index is 37.2 kg/m. Malnutrition Type:      Malnutrition Characteristics:      Nutrition Interventions:     RN Pressure Injury Documentation:     Consultants:     Procedures:     Antimicrobials:   Cefepime.     Subjective: Patient continue to be very weak and deconditioned, no nausea or vomiting. Her dyspnea continue to be worse with movement, no chest pain.  Objective: Vitals:   05/10/19 0410 05/10/19 0413 05/10/19 0517 05/10/19 0718  BP: (!) 135/54   (!) 130/51  Pulse: 82  78 84  Resp: (!) 27  13 16  Temp:  97.9 F (36.6 C)  98.3 F (36.8 C)  TempSrc:  Oral  Axillary  SpO2: 100%   95% 96%  Weight:      Height:        Intake/Output Summary (Last 24 hours) at 05/10/2019 0912 Last data filed at 05/10/2019 0400 Gross per 24 hour  Intake 520 ml  Output 1275 ml  Net -755 ml   Filed Weights   04/18/2019 2200 05/02/19 0600  Weight: 102.1 kg 101.4 kg    Examination:   General: deconditioned and ill looking appearing  Neurology: Awake and alert, non focal  E ENT: positive pallor, no icterus, oral mucosa moist Cardiovascular: No JVD. S1-S2 present, rhythmic, no gallops, rubs, or murmurs. Bilateral non pitting lower extremity edema. Pulmonary: positive breath sounds bilaterally. Gastrointestinal. Abdomen with no organomegaly, non tender, no rebound or guarding Skin. No rashes Musculoskeletal: no joint deformities     Data Reviewed: I have personally reviewed following labs and imaging studies  CBC: Recent Labs  Lab 05/06/19 0500 05/07/19 0500 05/08/19 0610 05/08/19 1700 05/10/19 0522  WBC 19.2* 26.5* 21.0* 20.1* 14.7*  NEUTROABS  --  21.1* 14.3* 14.3* 10.2*  HGB 14.4 14.2 13.4 13.1 11.9*  HCT 43.4 43.5 41.5 39.6 37.6  MCV 95.2 94.2 95.2 95.2 97.9  PLT 267 307 274 256 [id number]   Basic Metabolic Panel: Recent Labs  Lab 05/06/19 0500 05/07/19 0500 05/08/19 0610 05/09/19 0809 05/10/19 0522  NA 133* 129* 127* 131* 129*  K 4.1 4.2 4.3 4.6 4.8  CL 88* 85* 84* 90* 90*  CO2 34* 34* 32 32 30  GLUCOSE 201* 140* 67* 125* 167*  BUN 30* 28* 29* 22 16  CREATININE 0.66 0.58 0.63 0.56 0.56  CALCIUM 8.9 8.8* 8.7* 8.6* 8.7*   GFR: Estimated Creatinine Clearance: 69.5 mL/min (by C-G formula based on SCr of 0.56 mg/dL). Liver Function Tests: Recent Labs  Lab 05/05/19 0530 05/06/19 0500  AST 58* 46*  ALT 31 35  ALKPHOS 114 90  BILITOT 0.7 0.7  PROT 5.6* 5.6*  ALBUMIN 2.5* 2.5*   No results for input(s): LIPASE, AMYLASE in the last 168 hours. No results for input(s): AMMONIA in the last 168 hours. Coagulation Profile: No results for input(s): INR, PROTIME  in the last 168 hours. Cardiac Enzymes: No results for input(s): CKTOTAL, CKMB, CKMBINDEX, TROPONINI in the last 168 hours. BNP (last 3 results) No results for input(s): PROBNP in the last 8760 hours. HbA1C: No results for input(s): HGBA1C in the last 72 hours. CBG: Recent Labs  Lab 05/08/19 2116 05/09/19 0744 05/09/19 1138 05/09/19 1657 05/10/19 0723  GLUCAP 160* 80 81 144* 108*   Lipid Profile: No results for input(s): CHOL, HDL, LDLCALC, TRIG, CHOLHDL, LDLDIRECT in the last 72 hours. Thyroid Function Tests: No results for input(s): TSH, T4TOTAL, FREET4, T3FREE, THYROIDAB in the last 72 hours. Anemia Panel: No results for input(s): VITAMINB12, FOLATE, FERRITIN, TIBC, IRON, RETICCTPCT in the last 72 hours.    Radiology Studies: I have reviewed all of the imaging during this hospital visit personally     Scheduled Meds: . amLODipine  10 mg Oral QHS  . aspirin EC  325 mg Oral Daily  . atorvastatin  40 mg Oral q1800  . Chlorhexidine Gluconate Cloth  6 each Topical Daily  . cholecalciferol  1,000 Units Oral Daily  . enoxaparin (LOVENOX) injection  0.5 mg/kg Subcutaneous Q24H  . gabapentin  600 mg Oral TID WC & HS  . insulin aspart  0-20  Units Subcutaneous TID WC  . insulin aspart  0-5 Units Subcutaneous QHS  . insulin detemir  10 Units Subcutaneous QHS  . Ipratropium-Albuterol  1 puff Inhalation Q6H  . mouth rinse  15 mL Mouth Rinse BID  . metoprolol succinate  100 mg Oral Daily  . metoprolol succinate  50 mg Oral Daily  . pantoprazole  40 mg Oral Daily  . pneumococcal 23 valent vaccine  0.5 mL Intramuscular Tomorrow-1000  . pyridOXINE  50 mg Oral Daily  . sertraline  50 mg Oral Daily  . sodium chloride flush  10-40 mL Intracatheter Q12H  . sodium chloride flush  10-40 mL Intracatheter Q12H  . sodium chloride  1 g Oral TID WC  . traZODone  50 mg Oral QHS  . vitamin C  500 mg Oral Daily  . zinc sulfate  220 mg Oral Daily   Continuous Infusions: . ceFEPime  (MAXIPIME) IV 2 g (05/10/19 0505)     LOS: 15 days        Mauricio Gerome Apley, MD

## 2019-05-10 NOTE — Progress Notes (Signed)
Pt starting complaining of SOB, on 5L O2 with O2 sats 89-92%. Encouraged pt to roll on her side, she responded "I do not want to roll. Believe it or not, that really hurts." I encouraged her to use the incentive spirometer and she could not get it above 200, when asked if she could go any higher she snapped and said "I am doing the best I can!" I explained to her that she is most likely feeling this way because she refused to get up and to turn over in the bed today. Paged Dr. Cathlean Sauer and he wanted me to keep encouraging the IS and flutter valve. Will cont to monitor

## 2019-05-11 ENCOUNTER — Inpatient Hospital Stay (HOSPITAL_COMMUNITY): Payer: Medicare HMO

## 2019-05-11 LAB — GLUCOSE, CAPILLARY
Glucose-Capillary: 127 mg/dL — ABNORMAL HIGH (ref 70–99)
Glucose-Capillary: 120 mg/dL — ABNORMAL HIGH (ref 70–99)
Glucose-Capillary: 110 mg/dL — ABNORMAL HIGH (ref 70–99)
Glucose-Capillary: 129 mg/dL — ABNORMAL HIGH (ref 70–99)
Glucose-Capillary: 135 mg/dL — ABNORMAL HIGH (ref 70–99)
Glucose-Capillary: 93 mg/dL (ref 70–99)

## 2019-05-11 LAB — CBC WITH DIFFERENTIAL/PLATELET
Abs Immature Granulocytes: 0.18 10*3/uL — ABNORMAL HIGH (ref 0.00–0.07)
Basophils Absolute: 0 10*3/uL (ref 0.0–0.1)
Basophils Relative: 0 %
Eosinophils Absolute: 0.8 10*3/uL — ABNORMAL HIGH (ref 0.0–0.5)
Eosinophils Relative: 6 %
HCT: 37 % (ref 36.0–46.0)
Hemoglobin: 12 g/dL (ref 12.0–15.0)
Immature Granulocytes: 1 %
Lymphocytes Relative: 14 %
Lymphs Abs: 2 10*3/uL (ref 0.7–4.0)
MCH: 32 pg (ref 26.0–34.0)
MCHC: 32.4 g/dL (ref 30.0–36.0)
MCV: 98.7 fL (ref 80.0–100.0)
Monocytes Absolute: 0.7 10*3/uL (ref 0.1–1.0)
Monocytes Relative: 5 %
Neutro Abs: 10 10*3/uL — ABNORMAL HIGH (ref 1.7–7.7)
Neutrophils Relative %: 74 %
Platelets: 195 10*3/uL (ref 150–400)
RBC: 3.75 MIL/uL — ABNORMAL LOW (ref 3.87–5.11)
RDW: 15.4 % (ref 11.5–15.5)
WBC: 13.7 10*3/uL — ABNORMAL HIGH (ref 4.0–10.5)
nRBC: 0.1 % (ref 0.0–0.2)

## 2019-05-11 LAB — BASIC METABOLIC PANEL
Anion gap: 7 (ref 5–15)
BUN: 12 mg/dL (ref 8–23)
CO2: 29 mmol/L (ref 22–32)
Calcium: 8.6 mg/dL — ABNORMAL LOW (ref 8.9–10.3)
Chloride: 94 mmol/L — ABNORMAL LOW (ref 98–111)
Creatinine, Ser: 0.43 mg/dL — ABNORMAL LOW (ref 0.44–1.00)
GFR calc Af Amer: >60 mL/min (ref >60)
GFR calc non Af Amer: >60 mL/min (ref >60)
Glucose, Bld: 130 mg/dL — ABNORMAL HIGH (ref 70–99)
Potassium: 4.7 mmol/L (ref 3.5–5.1)
Sodium: 130 mmol/L — ABNORMAL LOW (ref 135–145)

## 2019-05-11 MED ORDER — FUROSEMIDE 10 MG/ML IJ SOLN
40.0000 mg | Freq: Once | INTRAMUSCULAR | Status: AC
  Administered 2019-05-11: 40 mg via INTRAVENOUS
  Filled 2019-05-11: qty 4

## 2019-05-11 NOTE — Progress Notes (Signed)
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 She does have significant past medical history for hypertension, dyslipidemia, TIA, GERD, anxiety, depression and arthritis. He tested positive for COVID-19 November 6. [name] a nursing home resident.Patient had a dry cough, she was quarantined at a skilled nursing facility. On the day of hospitalization she was noted to be hypoxic, requiring supplemental oxygen per nasal cannula. On her initial physical examination her oximetry was 76% on room air, blood pressure was 101/73, pulse rate 76, respirate 15, oxygen saturation 95% on 6 L of supplemental oxygen per nasal cannula.She had coarse breath sounds bilaterally, heart S1-S2 present rhythmic, abdomen soft, no lower extremity edema. Her chest radiograph had bilateral interstitial infiltrates, right upper lobe and left upper lobe.  Patient was admitted to the hospital working diagnosis of acute hypoxic respiratory failure due to SARS COVID-19 viral pneumonia.  Patient had increased oxygen requirements up to 15 L per nasal cannula, she has received remdesivir along with corticosteroids, and 1 dose of Actemra.  Her hospitalization has been complicated by tracheobronchitis, acute kidney injury, hypokalemia, hyponatremia and acute metabolic encephalopathy  Patient clinically slowly improving, continue to be very weak and deconditioned.  Follow up chest radiograph with predominant left lower lobe infiltrate, able to see air bronchogram.  Patient with increased oxygen requirements despite antibiotic therapy. Positive signs of volume overload, trial of diuresis.    Assessment & Plan:   Principal Problem:   Multifocal pneumonia Active Problems:   Depression   Acute respiratory failure with hypoxia (HCC)   Hyperlipidemia   Essential  hypertension   Pneumonia due to COVID-19 virus   1. Acute hypoxic respiratory failure due to SARS COVID 19 viral pneumonia/complicated withbacterial coinfection. Patient received Actemra 11/23, completed remdesivir on 11/25.Dexamethasone #11.   Worsening oxygen requirements up to 9 LPM this am, positive signs of volume overload, will hold on IV fluids and will trial furosemide IV 40 mg, monitori response.  Continue with bronchodilator therapy,airway clearing techniques with flutter valve and incentive spirometer.  Continue antibiotic therapy with cefepime #7/8. Continue to trend down wbc to 13,7 today.   2. AKI with hypokalemia and hyponatremia/ metabolic alkalosis.Stable renal function Na at 130, clinically hypervolemic, will add furosemide and follow renal panel in am. Avoid hypotension and nephrotoxic medications.  3. Acute metabolic encephalopathy/ in the setting of depression.continue with  Sertraline and trazodone.Continue to encourage out of bed and physical therapy.  4. HTN.On amlodipine and metoprolol for blood pressure control.   5. T2DM with dyslipidemia.Fasting glucose this am is 130. Continue with basal insulin to 10 units qhs andinsulin sliding scale for glucose cover and monitoring.Continue with atorvastatin.   6. Obesity.her calculated BMI is 37.2  DVT prophylaxis:enoxaparin Code Status:full Family Communication:no family at the bedside Disposition Plan/ discharge barriers:pending clinical improvement, plan to return to SNF on 05/12/19  Body mass index is 37.2 kg/m. Malnutrition Type:      Malnutrition Characteristics:      Nutrition Interventions:     RN Pressure Injury Documentation:     Consultants:     Procedures:     Antimicrobials:   Cefepime.     Subjective: Patient with worsening dyspnea and increased oxygen requirements, no nausea or vomiting, but continue to be very weak and deconditioned.   Objective:  Vitals:   05/11/19 [id number] 05/11/19 [id number] 05/11/19 0803 05/11/19 [id number]  BP: (!) 147/65  (!) 147/62 125/63  Pulse: 87 86 92 86  Resp: 20  20 20   Temp: 97.8 F (36.6 C)  97.6 F (36.4 C) 97.6 F (36.4 C)  TempSrc: Axillary  Oral Oral  SpO2: (!) 86% 95% 90% 93%  Weight:      Height:        Intake/Output Summary (Last 24 hours) at 05/11/2019 0834 Last data filed at 05/11/2019 0600 Gross per 24 hour  Intake 1564.4 ml  Output 800 ml  Net 764.4 ml   Filed Weights   04/21/2019 2200 05/02/19 0600  Weight: 102.1 kg 101.4 kg    Examination:   General: Not in pain or dyspnea, deconditioned  Neurology: Awake and alert, non focal  E ENT: no pallor, no icterus, oral mucosa moist Cardiovascular: No JVD. S1-S2 present, rhythmic, no gallops, rubs, or murmurs. ++ non pitting lower extremity edema. Pulmonary: positive breath sounds bilaterally. Gastrointestinal. Abdomen flat, no organomegaly, non tender, no rebound or guarding Skin. No rashes Musculoskeletal: no joint deformities     Data Reviewed: I have personally reviewed following labs and imaging studies  CBC: Recent Labs  Lab 05/07/19 0500 05/08/19 0610 05/08/19 1700 05/10/19 0522 05/11/19 0625  WBC 26.5* 21.0* 20.1* 14.7* 13.7*  NEUTROABS 21.1* 14.3* 14.3* 10.2* 10.0*  HGB 14.2 13.4 13.1 11.9* 12.0  HCT 43.5 41.5 39.6 37.6 37.0  MCV 94.2 95.2 95.2 97.9 98.7  PLT 307 274 256 219 0000000   Basic Metabolic Panel: Recent Labs  Lab 05/07/19 0500 05/08/19 0610 05/09/19 0809 05/10/19 0522 05/11/19 0625  NA 129* 127* 131* 129* 130*  K 4.2 4.3 4.6 4.8 4.7  CL 85* 84* 90* 90* 94*  CO2 34* 32 32 30 29  GLUCOSE 140* 67* 125* 167* 130*  BUN 28* 29* 22 16 12   CREATININE 0.58 0.63 0.56 0.56 0.43*  CALCIUM 8.8* 8.7* 8.6* 8.7* 8.6*   GFR: Estimated Creatinine Clearance: 69.5 mL/min (A) (by C-G formula based on SCr of 0.43 mg/dL (L)). Liver Function Tests: Recent Labs  Lab 05/05/19 0530 05/06/19 0500  AST 58* 46*  ALT 31 35   ALKPHOS 114 90  BILITOT 0.7 0.7  PROT 5.6* 5.6*  ALBUMIN 2.5* 2.5*   No results for input(s): LIPASE, AMYLASE in the last 168 hours. No results for input(s): AMMONIA in the last 168 hours. Coagulation Profile: No results for input(s): INR, PROTIME in the last 168 hours. Cardiac Enzymes: No results for input(s): CKTOTAL, CKMB, CKMBINDEX, TROPONINI in the last 168 hours. BNP (last 3 results) No results for input(s): PROBNP in the last 8760 hours. HbA1C: No results for input(s): HGBA1C in the last 72 hours. CBG: Recent Labs  Lab 05/09/19 1657 05/10/19 0723 05/10/19 1145 05/10/19 1521 05/10/19 2118  GLUCAP 144* 108* 94 122* 167*   Lipid Profile: No results for input(s): CHOL, HDL, LDLCALC, TRIG, CHOLHDL, LDLDIRECT in the last 72 hours. Thyroid Function Tests: No results for input(s): TSH, T4TOTAL, FREET4, T3FREE, THYROIDAB in the last 72 hours. Anemia Panel: No results for input(s): VITAMINB12, FOLATE, FERRITIN, TIBC, IRON, RETICCTPCT in the last 72 hours.    Radiology Studies: I have reviewed all of the imaging during this hospital visit personally     Scheduled Meds: . amLODipine  10 mg Oral QHS  . aspirin EC  325 mg Oral Daily  . atorvastatin  40 mg Oral q1800  . Chlorhexidine Gluconate Cloth  6 each Topical Daily  . cholecalciferol  1,000 Units Oral Daily  . enoxaparin (  LOVENOX) injection  0.5 mg/kg Subcutaneous Q24H  . gabapentin  600 mg Oral TID WC & HS  . insulin aspart  0-20 Units Subcutaneous TID WC  . insulin aspart  0-5 Units Subcutaneous QHS  . insulin detemir  10 Units Subcutaneous QHS  . Ipratropium-Albuterol  1 puff Inhalation Q6H  . mouth rinse  15 mL Mouth Rinse BID  . metoprolol succinate  100 mg Oral Daily  . metoprolol succinate  50 mg Oral Daily  . pantoprazole  40 mg Oral Daily  . pneumococcal 23 valent vaccine  0.5 mL Intramuscular Tomorrow-1000  . pyridOXINE  50 mg Oral Daily  . sertraline  50 mg Oral Daily  . traZODone  50 mg Oral QHS   . vitamin C  500 mg Oral Daily  . zinc sulfate  220 mg Oral Daily   Continuous Infusions: . sodium chloride 50 mL/hr at 05/10/19 1529  . ceFEPime (MAXIPIME) IV 2 g (05/11/19 [id number])     LOS: 16 days         Gerome Apley, MD

## 2019-05-11 NOTE — Progress Notes (Signed)
   05/11/19 1506  Family/Significant Other Communication  Family/Significant Other Update Called (no answer)

## 2019-05-11 NOTE — TOC Progression Note (Signed)
Transition of Care [facility]) - Progression Note    Patient Details  Name: [name] MRN: [id number] Date of Birth: 1940-12-04  Transition of Care [facility] CM/SW [place], [place] Phone Number: 05/11/2019, 10:00 AM  Clinical Narrative:   CSW alerted by MD that patient is still not medically stable for discharge. CSW updated [facility], and will keep them aware of when patient will return.     Expected Discharge Plan: [place] Barriers to Discharge: Continued Medical Work up  Expected Discharge Plan and Services Expected Discharge Plan: [place] arrangements for the past 2 months: [place]                 DME Arranged: N/A         HH Arranged: NA           Social Determinants of Health (SDOH) Interventions    Readmission Risk Interventions No flowsheet data found.

## 2019-05-12 ENCOUNTER — Inpatient Hospital Stay (HOSPITAL_COMMUNITY): Payer: Medicare HMO

## 2019-05-12 DIAGNOSIS — J9601 Acute respiratory failure with hypoxia: Secondary | ICD-10-CM | POA: Diagnosis not present

## 2019-05-12 DIAGNOSIS — I5033 Acute on chronic diastolic (congestive) heart failure: Secondary | ICD-10-CM

## 2019-05-12 DIAGNOSIS — E785 Hyperlipidemia, unspecified: Secondary | ICD-10-CM

## 2019-05-12 LAB — CBC WITH DIFFERENTIAL/PLATELET
Abs Immature Granulocytes: 0.09 10*3/uL — ABNORMAL HIGH (ref 0.00–0.07)
Basophils Absolute: 0 10*3/uL (ref 0.0–0.1)
Basophils Relative: 0 %
Eosinophils Absolute: 0.9 10*3/uL — ABNORMAL HIGH (ref 0.0–0.5)
Eosinophils Relative: 7 %
HCT: 36.8 % (ref 36.0–46.0)
Hemoglobin: 11.5 g/dL — ABNORMAL LOW (ref 12.0–15.0)
Immature Granulocytes: 1 %
Lymphocytes Relative: 15 %
Lymphs Abs: 1.9 10*3/uL (ref 0.7–4.0)
MCH: 31.3 pg (ref 26.0–34.0)
MCHC: 31.3 g/dL (ref 30.0–36.0)
MCV: 100 fL (ref 80.0–100.0)
Monocytes Absolute: 0.8 10*3/uL (ref 0.1–1.0)
Monocytes Relative: 6 %
Neutro Abs: 8.6 10*3/uL — ABNORMAL HIGH (ref 1.7–7.7)
Neutrophils Relative %: 71 %
Platelets: 189 10*3/uL (ref 150–400)
RBC: 3.68 MIL/uL — ABNORMAL LOW (ref 3.87–5.11)
RDW: 15.9 % — ABNORMAL HIGH (ref 11.5–15.5)
WBC: 12.2 10*3/uL — ABNORMAL HIGH (ref 4.0–10.5)
nRBC: 0.2 % (ref 0.0–0.2)

## 2019-05-12 LAB — BASIC METABOLIC PANEL
Anion gap: 9 (ref 5–15)
BUN: 13 mg/dL (ref 8–23)
CO2: 30 mmol/L (ref 22–32)
Calcium: 9.1 mg/dL (ref 8.9–10.3)
Chloride: 95 mmol/L — ABNORMAL LOW (ref 98–111)
Creatinine, Ser: 0.48 mg/dL (ref 0.44–1.00)
GFR calc Af Amer: >60 mL/min (ref >60)
GFR calc non Af Amer: >60 mL/min (ref >60)
Glucose, Bld: 129 mg/dL — ABNORMAL HIGH (ref 70–99)
Potassium: 5 mmol/L (ref 3.5–5.1)
Sodium: 134 mmol/L — ABNORMAL LOW (ref 135–145)

## 2019-05-12 LAB — GLUCOSE, CAPILLARY
Glucose-Capillary: 90 mg/dL (ref 70–99)
Glucose-Capillary: 134 mg/dL — ABNORMAL HIGH (ref 70–99)
Glucose-Capillary: 111 mg/dL — ABNORMAL HIGH (ref 70–99)
Glucose-Capillary: 136 mg/dL — ABNORMAL HIGH (ref 70–99)

## 2019-05-12 LAB — ECHOCARDIOGRAM COMPLETE
Height: 65 in
Weight: 3576.74 oz

## 2019-05-12 MED ORDER — NYSTATIN 100000 UNIT/ML MT SUSP
5.0000 mL | Freq: Four times a day (QID) | OROMUCOSAL | Status: DC
  Administered 2019-05-12 – 2019-05-21 (×34): 500000 [IU] via OROMUCOSAL
  Filled 2019-05-12 (×39): qty 5

## 2019-05-12 MED ORDER — HYDROCOD POLST-CPM POLST ER 10-8 MG/5ML PO SUER
5.0000 mL | Freq: Two times a day (BID) | ORAL | Status: DC
  Administered 2019-05-12 – 2019-05-24 (×22): 5 mL via ORAL
  Filled 2019-05-12 (×23): qty 5

## 2019-05-12 MED ORDER — PHENOL 1.4 % MT LIQD
1.0000 | OROMUCOSAL | Status: DC | PRN
  Administered 2019-05-12: 1 via OROMUCOSAL
  Filled 2019-05-12: qty 177

## 2019-05-12 MED ORDER — FUROSEMIDE 10 MG/ML IJ SOLN
40.0000 mg | Freq: Two times a day (BID) | INTRAMUSCULAR | Status: DC
  Administered 2019-05-12 – 2019-05-13 (×4): 40 mg via INTRAVENOUS
  Filled 2019-05-12 (×5): qty 4

## 2019-05-12 NOTE — Progress Notes (Signed)
Echocardiogram 2D Echocardiogram has been performed.  Oneal Deputy Jearld Hemp 05/12/2019, 12:21 PM

## 2019-05-12 NOTE — Progress Notes (Signed)
OT Cancellation Note  Patient Details Name: [name] MRN: [id number] DOB: 08-04-1940   Cancelled Treatment:    Reason Eval/Treat Not Completed: Fatigue/lethargy limiting ability to participate(Returned for second attempt. Pt stating "I am not going to do it. Not today." Continued education on importance of mobility for recovery.)  [place], OTR/L Acute Rehab Pager: [phone] Office: [phone] 05/12/2019, 4:56 PM

## 2019-05-12 NOTE — Progress Notes (Signed)
PT Cancellation Note  Patient Details Name: [name] MRN: [id number] DOB: 04-10-1941   Cancelled Treatment:    Reason Eval/Treat Not Completed: Patient declined, attempted x 2, pt. Reported in am that she would get up after noon. Declined in PM.   Claretha Cooper 05/12/2019, 5:20 PM

## 2019-05-12 NOTE — Progress Notes (Addendum)
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 She does have significant past medical history for hypertension, dyslipidemia, TIA, GERD, anxiety, depression and arthritis. He tested positive for COVID-19 November 6. [name] a nursing home resident.Patient had a dry cough, she was quarantined at a skilled nursing facility. On the day of hospitalization she was noted to be hypoxic, requiring supplemental oxygen per nasal cannula. On her initial physical examination her oximetry was 76% on room air, blood pressure was 101/73, pulse rate 76, respirate 15, oxygen saturation 95% on 6 L of supplemental oxygen per nasal cannula.She had coarse breath sounds bilaterally, heart S1-S2 present rhythmic, abdomen soft, no lower extremity edema. Her chest radiograph had bilateral interstitial infiltrates, right upper lobe and left upper lobe.  Patient was admitted to the hospital working diagnosis of acute hypoxic respiratory failure due to SARS COVID-19 viral pneumonia.  Patient had increased oxygen requirements up to 15 L per nasal cannula, she has received remdesivir along with corticosteroids, and 1 dose of Actemra.  Her hospitalization has been complicated by tracheobronchitis, acute kidney injury, hypokalemia, hyponatremia and acute metabolic encephalopathy  Patient clinically slowly improving, continue to be very weak and deconditioned.  Follow up chest radiograph with predominant left lower lobe infiltrate, able to see air bronchogram.  Patient with increased oxygen requirements despite antibiotic therapy. Positive signs of volume overload, trial of diuresis.    Assessment & Plan:   Principal Problem:   Multifocal pneumonia Active Problems:   Depression   Acute respiratory failure with hypoxia (HCC)   Hyperlipidemia   Essential  hypertension   Pneumonia due to COVID-19 virus   1. Acute hypoxic respiratory failure due to SARS COVID 19 viral pneumonia/complicated withbacterial coinfection. Patient received Actemra 11/23, completed remdesivir on 11/25.Dexamethasone #11.  RR: 20  Pulse oxymetry: 96%  Fi02: 10 LPM per HFNC  Chest radiograph personally reviewed, noted bilateral interstitial infiltrates consistent with pulmonary edema, after diuresis with 40 mg IV her urine output over last 24H is 4,350 ml with mild improvement in her symptoms. Suspected cardiogenic pulmonary edema.   Will continue with bronchodilator therapy,airway clearing techniques with flutter valve and incentive spirometer.Continue to encourage out of bed.   Today will complete antibiotic therapy withcefepime #8/8.  Patient is in high risk for worsening hypoxic respiratory failure.   2. Acute diastolic heart failure decompensation. Patient with persistent hypervolemia, will continue aggressive diuresis with furosemide 40 mg Iv q 12H, will monitor urine output, renal function and electrolytes. Follow on echocardiogram.   2. AKI with hypokalemia and hyponatremia/ metabolic alkalosis.Renal function with stable cr at 0,48, K at 5,0 and serum bicarbonate at 30. Will continue diuresis with furosemide, strict in and out, follow on renal panel in am, avoid hypotension and nephrotoxic medications.  3. Acute metabolic encephalopathy/ in the setting of depression.Clinically improved, patient is back to her baseline, will continue with sertraline and trazodone.  4. HTN.Continue with amlodipine and metoprolol.    5. T2DM with dyslipidemia.Today fasting glucose this am is 129. Tolerating well  basal insulin to 10 units qhs andinsulin sliding scale. On atorvastatin.   6. Obesity.BMI calculated is 37.2  DVT prophylaxis:enoxaparin Code Status:full Family Communication:no family at the bedside Disposition Plan/ discharge  barriers:pending clinical improvement.   Body mass index is 37.2 kg/m. Malnutrition Type:      Malnutrition Characteristics:  Nutrition Interventions:     RN Pressure Injury Documentation:     Consultants:     Procedures:     Antimicrobials:       Subjective: Patient with mild improvement of dyspnea, but continue with persistent dyspnea, worse with movement, no nausea or vomiting, no chest pain.   Objective: Vitals:   05/11/19 2000 05/11/19 2230 05/12/19 0734 05/12/19 0804  BP: (!) 108/57 139/63 (!) 114/45 104/60  Pulse: 89 (!) 103 99   Resp:   20   Temp:   98.1 F (36.7 C)   TempSrc:   Axillary   SpO2: (!) 86% 90% 96%   Weight:      Height:        Intake/Output Summary (Last 24 hours) at 05/12/2019 1244 Last data filed at 05/12/2019 1200 Gross per 24 hour  Intake 753.04 ml  Output 3650 ml  Net -2896.96 ml   Filed Weights   04/19/2019 2200 05/02/19 0600  Weight: 102.1 kg 101.4 kg    Examination:   General: positive dyspnea, deconditioned and ill looking appearing.  Neurology: Awake and alert, non focal  E ENT: mild pallor, no icterus, oral mucosa moist Cardiovascular: No JVD. S1-S2 present, rhythmic. No lower extremity edema. Pulmonary: positive breath sounds bilaterally. Decreased breath sounds at bases.  Gastrointestinal. Abdomen with no organomegaly, non tender, no rebound or guarding Skin. No rashes Musculoskeletal: no joint deformities     Data Reviewed: I have personally reviewed following labs and imaging studies  CBC: Recent Labs  Lab 05/08/19 0610 05/08/19 1700 05/10/19 0522 05/11/19 0625 05/12/19 0849  WBC 21.0* 20.1* 14.7* 13.7* 12.2*  NEUTROABS 14.3* 14.3* 10.2* 10.0* 8.6*  HGB 13.4 13.1 11.9* 12.0 11.5*  HCT 41.5 39.6 37.6 37.0 36.8  MCV 95.2 95.2 97.9 98.7 100.0  PLT 274 256 219 195 [id number]   Basic Metabolic Panel: Recent Labs  Lab 05/08/19 0610 05/09/19 0809 05/10/19 0522 05/11/19 0625 05/12/19 0849   NA 127* 131* 129* 130* 134*  K 4.3 4.6 4.8 4.7 5.0  CL 84* 90* 90* 94* 95*  CO2 32 32 30 29 30   GLUCOSE 67* 125* 167* 130* 129*  BUN 29* 22 16 12 13   CREATININE 0.63 0.56 0.56 0.43* 0.48  CALCIUM 8.7* 8.6* 8.7* 8.6* 9.1   GFR: Estimated Creatinine Clearance: 69.5 mL/min (by C-G formula based on SCr of 0.48 mg/dL). Liver Function Tests: Recent Labs  Lab 05/06/19 0500  AST 46*  ALT 35  ALKPHOS 90  BILITOT 0.7  PROT 5.6*  ALBUMIN 2.5*   No results for input(s): LIPASE, AMYLASE in the last 168 hours. No results for input(s): AMMONIA in the last 168 hours. Coagulation Profile: No results for input(s): INR, PROTIME in the last 168 hours. Cardiac Enzymes: No results for input(s): CKTOTAL, CKMB, CKMBINDEX, TROPONINI in the last 168 hours. BNP (last 3 results) No results for input(s): PROBNP in the last 8760 hours. HbA1C: No results for input(s): HGBA1C in the last 72 hours. CBG: Recent Labs  Lab 05/11/19 1622 05/11/19 1935 05/11/19 2227 05/12/19 0736 05/12/19 1200  GLUCAP 129* 135* 93 90 134*   Lipid Profile: No results for input(s): CHOL, HDL, LDLCALC, TRIG, CHOLHDL, LDLDIRECT in the last 72 hours. Thyroid Function Tests: No results for input(s): TSH, T4TOTAL, FREET4, T3FREE, THYROIDAB in the last 72 hours. Anemia Panel: No results for input(s): VITAMINB12, FOLATE, FERRITIN, TIBC, IRON, RETICCTPCT in the last 72 hours.    Radiology Studies: I have reviewed all of the imaging during this hospital  visit personally     Scheduled Meds: . amLODipine  10 mg Oral QHS  . aspirin EC  325 mg Oral Daily  . atorvastatin  40 mg Oral q1800  . Chlorhexidine Gluconate Cloth  6 each Topical Daily  . cholecalciferol  1,000 Units Oral Daily  . enoxaparin (LOVENOX) injection  0.5 mg/kg Subcutaneous Q24H  . furosemide  40 mg Intravenous Q12H  . gabapentin  600 mg Oral TID WC & HS  . insulin aspart  0-20 Units Subcutaneous TID WC  . insulin aspart  0-5 Units Subcutaneous QHS   . insulin detemir  10 Units Subcutaneous QHS  . Ipratropium-Albuterol  1 puff Inhalation Q6H  . mouth rinse  15 mL Mouth Rinse BID  . metoprolol succinate  100 mg Oral Daily  . metoprolol succinate  50 mg Oral Daily  . pantoprazole  40 mg Oral Daily  . pneumococcal 23 valent vaccine  0.5 mL Intramuscular Tomorrow-1000  . pyridOXINE  50 mg Oral Daily  . sertraline  50 mg Oral Daily  . traZODone  50 mg Oral QHS  . vitamin C  500 mg Oral Daily  . zinc sulfate  220 mg Oral Daily   Continuous Infusions: . ceFEPime (MAXIPIME) IV 2 g (05/12/19 0640)     LOS: 17 days        Isabella Fullwood Gerome Apley, MD

## 2019-05-12 NOTE — Progress Notes (Signed)
OT Cancellation Note  Patient Details Name: [name] MRN: [id number] DOB: [birth year]   Cancelled Treatment:    Reason Eval/Treat Not Completed: Fatigue/lethargy limiting ability to participate. Pt complaining that she does not want to get out of bed. MAXIMUM education provided on importance of upright position and movement not only in reference to SpO2 and health for lungs/activity tolerance/deconditioning but also as a way to assist with management of back pain (Pt pre-medicated and next availability not until 16:00). Pt was given multiple options for movement and positional changes- from rolling in bed to check peri area for wetness as well as EOB to check orthostatics - Pt adamantly refusing all options "no, no, no" . Pt reports that she will attempt movement after lunch. OT will check back as schedule allows.   Merri Ray Eshal Propps 05/12/2019, 10:54 AM   Hulda Humphrey OTR/L Acute Rehabilitation Services Pager: [phone] Office: [phone]

## 2019-05-12 NOTE — Plan of Care (Signed)
Patient refusing all mobility, turning, and deep breathing exercises. Refused PT/OT today. Remains stable on 9L HFNC. Other VS stable.   Problem: Coping: Goal: Psychosocial and spiritual needs will be supported Outcome: Progressing   Problem: Respiratory: Goal: Will maintain a patent airway Outcome: Progressing   Problem: Clinical Measurements: Goal: Ability to maintain clinical measurements within normal limits will improve Outcome: Progressing Goal: Will remain free from infection Outcome: Progressing Goal: Cardiovascular complication will be avoided Outcome: Progressing   Problem: Coping: Goal: Level of anxiety will decrease Outcome: Progressing   Problem: Elimination: Goal: Will not experience complications related to urinary retention Outcome: Progressing   Problem: Pain Managment: Goal: General experience of comfort will improve Outcome: Progressing   Problem: Safety: Goal: Ability to remain free from injury will improve Outcome: Progressing   Problem: Respiratory: Goal: Complications related to the disease process, condition or treatment will be avoided or minimized Outcome: Not Progressing Note: Patient refuses cough/deep breathing exercises as well as mobility and turning   Problem: Clinical Measurements: Goal: Respiratory complications will improve Outcome: Not Progressing   Problem: Activity: Goal: Risk for activity intolerance will decrease Outcome: Not Progressing   Problem: Nutrition: Goal: Adequate nutrition will be maintained Outcome: Not Progressing Note: Poor appetite

## 2019-05-13 LAB — CBC WITH DIFFERENTIAL/PLATELET
Abs Immature Granulocytes: 0.08 10*3/uL — ABNORMAL HIGH (ref 0.00–0.07)
Basophils Absolute: 0 10*3/uL (ref 0.0–0.1)
Basophils Relative: 0 %
Eosinophils Absolute: 0.8 10*3/uL — ABNORMAL HIGH (ref 0.0–0.5)
Eosinophils Relative: 7 %
HCT: 37 % (ref 36.0–46.0)
Hemoglobin: 11.4 g/dL — ABNORMAL LOW (ref 12.0–15.0)
Immature Granulocytes: 1 %
Lymphocytes Relative: 18 %
Lymphs Abs: 2.1 10*3/uL (ref 0.7–4.0)
MCH: 31.1 pg (ref 26.0–34.0)
MCHC: 30.8 g/dL (ref 30.0–36.0)
MCV: 100.8 fL — ABNORMAL HIGH (ref 80.0–100.0)
Monocytes Absolute: 0.9 10*3/uL (ref 0.1–1.0)
Monocytes Relative: 7 %
Neutro Abs: 8.1 10*3/uL — ABNORMAL HIGH (ref 1.7–7.7)
Neutrophils Relative %: 67 %
Platelets: 167 10*3/uL (ref 150–400)
RBC: 3.67 MIL/uL — ABNORMAL LOW (ref 3.87–5.11)
RDW: 15.9 % — ABNORMAL HIGH (ref 11.5–15.5)
WBC: 12 10*3/uL — ABNORMAL HIGH (ref 4.0–10.5)
nRBC: 0 % (ref 0.0–0.2)

## 2019-05-13 LAB — GLUCOSE, CAPILLARY
Glucose-Capillary: 94 mg/dL (ref 70–99)
Glucose-Capillary: 134 mg/dL — ABNORMAL HIGH (ref 70–99)
Glucose-Capillary: 133 mg/dL — ABNORMAL HIGH (ref 70–99)

## 2019-05-13 LAB — BASIC METABOLIC PANEL
Anion gap: 8 (ref 5–15)
BUN: 19 mg/dL (ref 8–23)
CO2: 34 mmol/L — ABNORMAL HIGH (ref 22–32)
Calcium: 9.1 mg/dL (ref 8.9–10.3)
Chloride: 92 mmol/L — ABNORMAL LOW (ref 98–111)
Creatinine, Ser: 0.57 mg/dL (ref 0.44–1.00)
GFR calc Af Amer: >60 mL/min (ref >60)
GFR calc non Af Amer: >60 mL/min (ref >60)
Glucose, Bld: 138 mg/dL — ABNORMAL HIGH (ref 70–99)
Potassium: 4.5 mmol/L (ref 3.5–5.1)
Sodium: 134 mmol/L — ABNORMAL LOW (ref 135–145)

## 2019-05-13 MED ORDER — MAGIC MOUTHWASH W/LIDOCAINE: 5.0000 mL | Freq: Four times a day (QID) | ORAL | Status: DC

## 2019-05-13 MED ORDER — MAGIC MOUTHWASH W/LIDOCAINE
5.0000 mL | Freq: Four times a day (QID) | ORAL | Status: DC | PRN
  Administered 2019-05-13: 5 mL via ORAL
  Filled 2019-05-13 (×2): qty 5

## 2019-05-13 NOTE — Progress Notes (Addendum)
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 She does have significant past medical history for hypertension, dyslipidemia, TIA, GERD, anxiety, depression and arthritis. She tested positive for COVID-19 November 6. [name] a nursing home resident.Patient had a dry cough, she was quarantined at a skilled nursing facility. On the day of hospitalization she was noted to be hypoxic, requiring supplemental oxygen per nasal cannula. On her initial physical examination her oximetry was 76% on room air, blood pressure was 101/73, pulse rate 76, respirate 15, oxygen saturation 95% on 6 L of supplemental oxygen per nasal cannula.She had coarse breath sounds bilaterally, heart S1-S2 present rhythmic, abdomen soft, no lower extremity edema. Her chest radiograph had bilateral interstitial infiltrates, right upper lobe and left upper lobe.  Patient was admitted to the hospital working diagnosis of acute hypoxic respiratory failure due to SARS COVID-19 viral pneumonia.  Assessment & Plan:   Acute hypoxic respiratory failure due to SARS COVID 19 viral pneumonia/complicated withbacterial coinfection Patient received Actemra 11/23, completed remdesivir on 11/25. Patient has also completed course of dexamethasone.  [name] still requiring high doses of oxygen.  [name] also getting diuresed with Lasix as discussed below.  Wean down oxygen.  She was dropped down to 6 L as she was saturating 97%.  Mobilize.  Noted that she is not cooperating with PT or OT.  Will discuss with her tomorrow.  Chest x-ray from 12/7 showed significant worsening in the aeration of the lungs.  Patient was started on diuretics at that time.  Will repeat chest x-ray tomorrow.  Inflammatory markers had improved.  Continue mobilization as much as possible.  Incentive spirometry.  She was  also on antibiotics due to concern for secondary bacterial infection.  She has completed course of cefepime.  Acute diastolic heart failure decompensation Chest x-ray showed significant worsening on 12/7.  There was concern for fluid overload.  Patient was started on furosemide with good diuresis.  Echocardiogram shows grade 1 diastolic dysfunction.  Systolic function is normal.   See report below.  Hyponatremia Monitor sodium levels closely.  Renal function is normal.    Acute metabolic encephalopathy/history of depression Mental status is improved.  Seems to be back to her baseline.  Continue sertraline and trazodone.    Essential hypertension  Blood pressure is reasonably well-controlled.  Continue with amlodipine and metoprolol.    Diabetes mellitus type 2 HbA1c 6.3 but in 2019.  Not checked during this hospitalization yet.  CBGs are reasonably well controlled.  Patient noted to be on SSI as well as Levemir.  Since she seems to have completed course of dexamethasone we will stop the Levemir.  Dyslipidemia On atorvastatin.   Obesity Estimated body mass index is 37.2 kg/m as calculated from the following:   Height as of this encounter: 5\' 5"  (1.651 m).   Weight as of this encounter: 101.4 kg.   DVT prophylaxis:enoxaparin Code Status: Full code Family Communication:no family at the bedside Disposition Plan/ discharge barriers:Wait for further improvement.  Wait for improvement in oxygen requirements.  Will likely go back to her skilled nursing facility when ready for discharge.   Consultants:   None  Procedures:  Transthoracic echocardiogram 05/12/2019  1. Small circumferential pericardial effusion.  2. Left ventricular ejection fraction, by visual estimation, is 65 to 70%. The left ventricle has hyperdynamic function. There is mildly  increased left ventricular hypertrophy.  3. Left ventricular diastolic parameters are consistent with Grade I diastolic dysfunction  (impaired relaxation).  4. The left ventricle has no regional wall motion abnormalities.  5. Global right ventricle has normal systolic function.The right ventricular size is normal. No increase in right ventricular wall thickness.  6. The aortic valve is tricuspid. Aortic valve regurgitation is not visualized. Mild aortic valve sclerosis without stenosis.  7. Left atrial size was normal.  8. Right atrial size was normal.  9. The tricuspid valve is normal in structure. Tricuspid valve regurgitation is not demonstrated. 10. The mitral valve is normal in structure. No evidence of mitral valve regurgitation. No evidence of mitral stenosis. 11. The inferior vena cava is normal in size with greater than 50% respiratory variability, suggesting right atrial pressure of 3 mmHg. 12. TR signal is inadequate for assessing pulmonary artery systolic pressure.   Antimicrobials:  Anti-infectives (From admission, onward)   Start     Dose/Rate Route Frequency Ordered Stop   05/05/19 1500  ceFEPIme (MAXIPIME) 2 g in sodium chloride 0.9 % 100 mL IVPB     2 g 200 mL/hr over 30 Minutes Intravenous Every 8 hours 05/05/19 1451 05/12/19 2216   04/27/19 1400  ceFEPIme (MAXIPIME) 2 g in sodium chloride 0.9 % 100 mL IVPB  Status:  Discontinued     2 g 200 mL/hr over 30 Minutes Intravenous Every 8 hours 04/27/19 1014 04/28/19 0741   04/26/19 1000  remdesivir 100 mg in sodium chloride 0.9 % 250 mL IVPB     100 mg 500 mL/hr over 30 Minutes Intravenous Every 24 hours 04/25/19 0924 04/29/19 0758   04/26/19 0800  vancomycin (VANCOCIN) IVPB 1000 mg/200 mL premix  Status:  Discontinued     1,000 mg 200 mL/hr over 60 Minutes Intravenous Every 24 hours 04/25/19 0659 04/28/19 0741   04/25/19 1800  ceFEPIme (MAXIPIME) 2 g in sodium chloride 0.9 % 100 mL IVPB  Status:  Discontinued     2 g 200 mL/hr over 30 Minutes Intravenous Every 12 hours 04/25/19 0658 04/27/19 1014   04/25/19 1200  remdesivir 200 mg in sodium chloride  0.9 % 250 mL IVPB     200 mg 500 mL/hr over 30 Minutes Intravenous Once 04/25/19 0924 04/25/19 1220   04/25/19 0615  vancomycin (VANCOCIN) 2,250 mg in sodium chloride 0.9 % 500 mL IVPB     2,250 mg 250 mL/hr over 120 Minutes Intravenous STAT 04/25/19 0613 04/25/19 0956   04/25/19 0615  ceFEPIme (MAXIPIME) 2 g in sodium chloride 0.9 % 100 mL IVPB     2 g 200 mL/hr over 30 Minutes Intravenous STAT 04/25/19 0613 04/25/19 0704        Subjective: Patient denies any chest pain.  States that she continues to have difficulty breathing.  No cough.  No abdominal pain.  He is asking how many more days she will be here in the hospital.  Objective: Vitals:   05/12/19 2100 05/13/19 0429 05/13/19 0803 05/13/19 0827  BP:   (!) 118/56 (!) 118/56  Pulse:   91 93  Resp:    20  Temp: 97.7 F (36.5 C) 98.4 F (36.9 C)  97.9 F (36.6 C)  TempSrc: Oral Oral  Axillary  SpO2:    94%  Weight:      Height:        Intake/Output Summary (Last 24 hours) at 05/13/2019 1259 Last data filed at 05/13/2019 1200 Gross per 24 hour  Intake 1065.03  ml  Output 2150 ml  Net -1084.97 ml   Filed Weights   04/16/2019 2200 05/02/19 0600  Weight: 102.1 kg 101.4 kg    Examination:   General appearance: Awake alert.  In no distress.  Mildly distracted Resp: Coarse breath sound bilaterally.  Crackles at the bases.  No wheezing or rhonchi. Cardio: S1-S2 is normal regular.  No S3-S4.  No rubs murmurs or bruit GI: Abdomen is soft.  Nontender nondistended.  Bowel sounds are present normal.  No masses organomegaly Extremities: No edema.  Moves all her extremities Neurologic: Alert and oriented x3.  No focal neurological deficits.       Data Reviewed: I have personally reviewed following labs and imaging studies  CBC: Recent Labs  Lab 05/08/19 1700 05/10/19 0522 05/11/19 0625 05/12/19 0849 05/13/19 0126  WBC 20.1* 14.7* 13.7* 12.2* 12.0*  NEUTROABS 14.3* 10.2* 10.0* 8.6* 8.1*  HGB 13.1 11.9* 12.0 11.5*  11.4*  HCT 39.6 37.6 37.0 36.8 37.0  MCV 95.2 97.9 98.7 100.0 100.8*  PLT 256 219 195 189 [id number]   Basic Metabolic Panel: Recent Labs  Lab 05/09/19 0809 05/10/19 0522 05/11/19 0625 05/12/19 0849 05/13/19 0126  NA 131* 129* 130* 134* 134*  K 4.6 4.8 4.7 5.0 4.5  CL 90* 90* 94* 95* 92*  CO2 32 30 29 30  34*  GLUCOSE 125* 167* 130* 129* 138*  BUN 22 16 12 13 19   CREATININE 0.56 0.56 0.43* 0.48 0.57  CALCIUM 8.6* 8.7* 8.6* 9.1 9.1   GFR: Estimated Creatinine Clearance: 69.5 mL/min (by C-G formula based on SCr of 0.57 mg/dL).  CBG: Recent Labs  Lab 05/12/19 1200 05/12/19 1539 05/12/19 2157 05/13/19 0813 05/13/19 1132  GLUCAP 134* 111* 136* 94 134*      Radiology Studies: I have reviewed all of the imaging during this hospital visit personally     Scheduled Meds:  amLODipine  10 mg Oral QHS   aspirin EC  325 mg Oral Daily   atorvastatin  40 mg Oral q1800   Chlorhexidine Gluconate Cloth  6 each Topical Daily   chlorpheniramine-HYDROcodone  5 mL Oral Q12H   cholecalciferol  1,000 Units Oral Daily   enoxaparin (LOVENOX) injection  0.5 mg/kg Subcutaneous Q24H   furosemide  40 mg Intravenous Q12H   gabapentin  600 mg Oral TID WC & HS   insulin aspart  0-20 Units Subcutaneous TID WC   insulin aspart  0-5 Units Subcutaneous QHS   insulin detemir  10 Units Subcutaneous QHS   Ipratropium-Albuterol  1 puff Inhalation Q6H   mouth rinse  15 mL Mouth Rinse BID   metoprolol succinate  100 mg Oral Daily   metoprolol succinate  50 mg Oral Daily   nystatin  5 mL Mouth/Throat QID   pantoprazole  40 mg Oral Daily   pneumococcal 23 valent vaccine  0.5 mL Intramuscular Tomorrow-1000   pyridOXINE  50 mg Oral Daily   sertraline  50 mg Oral Daily   traZODone  50 mg Oral QHS   vitamin C  500 mg Oral Daily   zinc sulfate  220 mg Oral Daily   Continuous Infusions:    LOS: 18 days     Bonnielee Haff, MD Pager: [email or web address]

## 2019-05-13 NOTE — Plan of Care (Signed)
  Problem: Coping: Goal: Psychosocial and spiritual needs will be supported Outcome: Progressing   Problem: Respiratory: Goal: Will maintain a patent airway Outcome: Progressing Goal: Complications related to the disease process, condition or treatment will be avoided or minimized Outcome: Progressing   Problem: Clinical Measurements: Goal: Ability to maintain clinical measurements within normal limits will improve Outcome: Progressing Goal: Will remain free from infection Outcome: Progressing Goal: Respiratory complications will improve Outcome: Progressing Goal: Cardiovascular complication will be avoided Outcome: Progressing   Problem: Activity: Goal: Risk for activity intolerance will decrease Outcome: Progressing   Problem: Coping: Goal: Level of anxiety will decrease Outcome: Progressing   Problem: Elimination: Goal: Will not experience complications related to urinary retention Outcome: Progressing   Problem: Pain Managment: Goal: General experience of comfort will improve Outcome: Progressing   Problem: Safety: Goal: Ability to remain free from injury will improve Outcome: Progressing

## 2019-05-13 NOTE — Progress Notes (Signed)
OT Cancellation Note  Patient Details Name: [name] MRN: [id number] DOB: 06-04-1941   Cancelled Treatment:    Reason Eval/Treat Not Completed: Fatigue/lethargy limiting ability to participate;Pain limiting ability to participate. Pt adamantly declining therapy despite max education again "I know what you are saying, I just don't want to do it, and I don't have to give an answer" Attempted to call both sons Konrad Dolores and [place]) no answers, I did not leave a message. I think that a Palliative consult to set quality of life goals would be a good idea. I suggested this to Ms. Valere Dross and she was in agreement. I also informed Ms. Scheibner that if she declines again next time therapy comes that per our protocol, we would have to discharge her from therapy caseload.  Merri Ray Min Collymore 05/13/2019, 10:52 AM   Hulda Humphrey OTR/L Acute Rehabilitation Services Pager: [phone] Office: [phone]

## 2019-05-14 ENCOUNTER — Inpatient Hospital Stay (HOSPITAL_COMMUNITY): Payer: Medicare HMO

## 2019-05-14 LAB — BASIC METABOLIC PANEL
Anion gap: 8 (ref 5–15)
BUN: 16 mg/dL (ref 8–23)
CO2: 36 mmol/L — ABNORMAL HIGH (ref 22–32)
Calcium: 9.4 mg/dL (ref 8.9–10.3)
Chloride: 90 mmol/L — ABNORMAL LOW (ref 98–111)
Creatinine, Ser: 0.52 mg/dL (ref 0.44–1.00)
GFR calc Af Amer: >60 mL/min (ref >60)
GFR calc non Af Amer: >60 mL/min (ref >60)
Glucose, Bld: 136 mg/dL — ABNORMAL HIGH (ref 70–99)
Potassium: 4.2 mmol/L (ref 3.5–5.1)
Sodium: 134 mmol/L — ABNORMAL LOW (ref 135–145)

## 2019-05-14 LAB — CBC
HCT: 35.6 % — ABNORMAL LOW (ref 36.0–46.0)
Hemoglobin: 11.1 g/dL — ABNORMAL LOW (ref 12.0–15.0)
MCH: 31.4 pg (ref 26.0–34.0)
MCHC: 31.2 g/dL (ref 30.0–36.0)
MCV: 100.8 fL — ABNORMAL HIGH (ref 80.0–100.0)
Platelets: 140 10*3/uL — ABNORMAL LOW (ref 150–400)
RBC: 3.53 MIL/uL — ABNORMAL LOW (ref 3.87–5.11)
RDW: 15.9 % — ABNORMAL HIGH (ref 11.5–15.5)
WBC: 10.7 10*3/uL — ABNORMAL HIGH (ref 4.0–10.5)
nRBC: 0 % (ref 0.0–0.2)

## 2019-05-14 LAB — HEMOGLOBIN A1C
Hgb A1c MFr Bld: 8.4 % — ABNORMAL HIGH (ref 4.8–5.6)
Mean Plasma Glucose: 194.38 mg/dL

## 2019-05-14 LAB — GLUCOSE, CAPILLARY
Glucose-Capillary: 167 mg/dL — ABNORMAL HIGH (ref 70–99)
Glucose-Capillary: 128 mg/dL — ABNORMAL HIGH (ref 70–99)
Glucose-Capillary: 107 mg/dL — ABNORMAL HIGH (ref 70–99)
Glucose-Capillary: 166 mg/dL — ABNORMAL HIGH (ref 70–99)
Glucose-Capillary: 142 mg/dL — ABNORMAL HIGH (ref 70–99)

## 2019-05-14 MED ORDER — FUROSEMIDE 10 MG/ML IJ SOLN
80.0000 mg | Freq: Two times a day (BID) | INTRAMUSCULAR | Status: DC
  Administered 2019-05-14 – 2019-05-15 (×3): 80 mg via INTRAVENOUS
  Filled 2019-05-14 (×4): qty 8

## 2019-05-14 MED ORDER — LIP MEDEX EX OINT
TOPICAL_OINTMENT | CUTANEOUS | Status: DC | PRN
  Administered 2019-05-14 (×2): via TOPICAL
  Filled 2019-05-14: qty 7

## 2019-05-14 MED ORDER — ADULT MULTIVITAMIN W/MINERALS CH
1.0000 | ORAL_TABLET | Freq: Every day | ORAL | Status: DC
  Administered 2019-05-14 – 2019-05-27 (×14): 1 via ORAL
  Filled 2019-05-14 (×14): qty 1

## 2019-05-14 MED ORDER — MAGIC MOUTHWASH W/LIDOCAINE
10.0000 mL | Freq: Three times a day (TID) | ORAL | Status: DC
  Administered 2019-05-14 – 2019-05-27 (×38): 10 mL via ORAL
  Filled 2019-05-14 (×47): qty 10

## 2019-05-14 NOTE — Progress Notes (Signed)
Attempted to call Son with update, No answer at this time.

## 2019-05-14 NOTE — Progress Notes (Signed)
Physical Therapy Treatment Patient Details Name: [name] MRN: [id number] DOB: 20-Nov-1940 Today's Date: 05/14/2019    History of Present Illness 78 y.o. female past medical history significant for hypertension TIA anxiety depression presents to the hospital from [facility] via EMS for hypoxia she tested positive for COVID-19 on 04/10/2019. pt admitted with acute respiratory failure with hypoxia due to COVID 19 PNA.  Chest xray 12/1 shows small pleural effusion.    PT Comments    The patient reports feeling better and wants to work on exercises. Patient performed LE exercises. Placed in bed chair position and  perfomed 3 sets of 3 reps of sitting upright. Patient on  7 L HFNC with Left ear probe, poor pleth reading 83% . Replaced probe with gradual increase to 955 while resting. Patient requires frequent rest breaks. Continue PT   Follow Up Recommendations  SNF     Equipment Recommendations  None recommended by PT    Recommendations for Other Services       Precautions / Restrictions Precautions Precautions: Fall Precaution Comments: monitor O2; orthostatic, very weak    Mobility  Bed Mobility   Bed Mobility: Rolling Rolling: Min assist   Supine to sit: Supervision     General bed mobility comments: patient self assisted rolling to each side for bed change/pericare. Placed  bed in chair position. patient performed self long sitting, using rails x 3 x 3 sets.  Transfers                    Ambulation/Gait                 Stairs             Wheelchair Mobility    Modified Rankin (Stroke Patients Only)       Balance                                            Cognition Arousal/Alertness: Awake/alert Behavior During Therapy: WFL for tasks assessed/performed                                   General Comments: patient motivated today to work w/ PT      Exercises General Exercises - Lower  Extremity Ankle Circles/Pumps: AROM;Both;10 reps Heel Slides: AAROM;Both;10 reps Hip ABduction/ADduction: AAROM;Both;10 reps    General Comments        Pertinent Vitals/Pain Faces Pain Scale: Hurts whole lot Pain Location: mouth sores Pain Descriptors / Indicators: Burning;Discomfort;Grimacing Pain Intervention(s): Monitored during session;Premedicated before session    Home Living                      Prior Function            PT Goals (current goals can now be found in the care plan section) Acute Rehab PT Goals Patient Stated Goal: to get stronger Time For Goal Achievement: 2019-06-11 Potential to Achieve Goals: Fair Progress towards PT goals: Progressing toward goals    Frequency    Min 2X/week      PT Plan Current plan remains appropriate    Co-evaluation              AM-PAC PT "6 Clicks" Mobility   Outcome Measure  Help needed turning from your  back to your side while in a flat bed without using bedrails?: A Little Help needed moving from lying on your back to sitting on the side of a flat bed without using bedrails?: A Little Help needed moving to and from a bed to a chair (including a wheelchair)?: A Lot Help needed standing up from a chair using your arms (e.g., wheelchair or bedside chair)?: A Lot Help needed to walk in hospital room?: Total Help needed climbing 3-5 steps with a railing? : Total 6 Click Score: 12    End of Session Equipment Utilized During Treatment: Oxygen Activity Tolerance: Patient tolerated treatment well Patient left: in bed;with call bell/phone within reach Nurse Communication: Mobility status;Need for lift equipment PT Visit Diagnosis: Unsteadiness on feet (R26.81);Muscle weakness (generalized) (M62.81)     Time: [id number] PT Time Calculation (min) (ACUTE ONLY): 38 min  Charges:  $Therapeutic Exercise: 8-22 mins $Therapeutic Activity: 8-22 mins $Self Care/Home Management: Darling Pager [phone] Office [phone]    Claretha Cooper 05/14/2019, 4:34 PM

## 2019-05-14 NOTE — Care Management Important Message (Signed)
Important Message  Patient Details  Name: ALAENA CAUL MRN: [id number] Date of Birth: [birth year]   Medicare Important Message Given:  Yes - Important Message mailed due to current National Emergency  Verbal consent obtained due to current National Emergency  Relationship to patient: Child Contact Name: Donovan Kail Call Date: 05/14/19  Time: [id number] Phone: [phone] Outcome: No Answer/Busy Important Message mailed to: Patient address on file    Delorse Lek 05/14/2019, 9:52 AM

## 2019-05-14 NOTE — Progress Notes (Signed)
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 She does have significant past medical history for hypertension, dyslipidemia, TIA, GERD, anxiety, depression and arthritis. She tested positive for COVID-19 November 6. [name] a nursing home resident.Patient had a dry cough, she was quarantined at a skilled nursing facility. On the day of hospitalization she was noted to be hypoxic, requiring supplemental oxygen per nasal cannula. On her initial physical examination her oximetry was 76% on room air, blood pressure was 101/73, pulse rate 76, respirate 15, oxygen saturation 95% on 6 L of supplemental oxygen per nasal cannula.She had coarse breath sounds bilaterally, heart S1-S2 present rhythmic, abdomen soft, no lower extremity edema. Her chest radiograph had bilateral interstitial infiltrates, right upper lobe and left upper lobe.  Patient was admitted to the hospital working diagnosis of acute hypoxic respiratory failure due to SARS COVID-19 viral pneumonia.   Assessment & Plan:   Acute hypoxic respiratory failure due to SARS COVID 19 viral pneumonia/complicated withbacterial coinfection Patient received Actemra 11/23. Completed remdesivir on 11/25. Patient has also completed course of dexamethasone.  Patient continues to require high doses of oxygen.  Chest x-ray continues to show significant bilateral infiltrates.  She will be given higher dose of Lasix.  She recently completed course of cefepime as well.  Continue with incentive spirometry.  Patient told that she needs to cooperate with physical and Occupational Therapy.    Acute diastolic heart failure decompensation Chest x-ray continues to show significant bilateral infiltrates.  Concern for fluid overload.  Patient was started on furosemide with good diuresis.  Echocardiogram showed  grade 1 diastolic dysfunction with normal systolic function.  We will increase the dose of furosemide today.    Hyponatremia Continue to monitor closely.  Acute metabolic encephalopathy/history of depression Mentation seems to be back to baseline.  Continue with sertraline and trazodone.    Essential hypertension  Well-controlled.  Continue to monitor.     Diabetes mellitus type 2 HbA1c 8.4.  Continue to monitor CBGs.  Due to low glucose levels her Levemir was discontinued.  Continue to monitor.    Dyslipidemia On atorvastatin.   Macrocytic anemia No evidence of overt bleeding.  Obesity Estimated body mass index is 37.2 kg/m as calculated from the following:   Height as of this encounter: 5\' 5"  (1.651 m).   Weight as of this encounter: 101.4 kg.  Goals of care Discussed with patient.  She was told that she needed to cooperate and work with physical and Occupational Therapy.  She says that she is somewhat depressed due to significant ulcerations in her oral cavity and the resultant pain.  Will increase her Magic mouthwash dose.  Continue current care for now.  DVT prophylaxis:enoxaparin Code Status: Full code Family Communication:Try to call her son yesterday without success.  We will try again today. Disposition Plan:Wait for clinical improvement.  She will need to go to skilled nursing facility at discharge.     Consultants:   None  Procedures:  Transthoracic echocardiogram 05/12/2019  1. Small circumferential pericardial effusion.  2. Left ventricular ejection fraction, by visual estimation, is 65 to 70%. The left ventricle has hyperdynamic function. There is mildly increased left ventricular hypertrophy.  3. Left ventricular diastolic parameters are consistent with Grade I diastolic dysfunction (impaired relaxation).  4. The left ventricle has no regional wall motion abnormalities.  5. Global  right ventricle has normal systolic function.The right ventricular  size is normal. No increase in right ventricular wall thickness.  6. The aortic valve is tricuspid. Aortic valve regurgitation is not visualized. Mild aortic valve sclerosis without stenosis.  7. Left atrial size was normal.  8. Right atrial size was normal.  9. The tricuspid valve is normal in structure. Tricuspid valve regurgitation is not demonstrated. 10. The mitral valve is normal in structure. No evidence of mitral valve regurgitation. No evidence of mitral stenosis. 11. The inferior vena cava is normal in size with greater than 50% respiratory variability, suggesting right atrial pressure of 3 mmHg. 12. TR signal is inadequate for assessing pulmonary artery systolic pressure.   Antimicrobials:  Anti-infectives (From admission, onward)   Start     Dose/Rate Route Frequency Ordered Stop   05/05/19 1500  ceFEPIme (MAXIPIME) 2 g in sodium chloride 0.9 % 100 mL IVPB     2 g 200 mL/hr over 30 Minutes Intravenous Every 8 hours 05/05/19 1451 05/12/19 2216   04/27/19 1400  ceFEPIme (MAXIPIME) 2 g in sodium chloride 0.9 % 100 mL IVPB  Status:  Discontinued     2 g 200 mL/hr over 30 Minutes Intravenous Every 8 hours 04/27/19 1014 04/28/19 0741   04/26/19 1000  remdesivir 100 mg in sodium chloride 0.9 % 250 mL IVPB     100 mg 500 mL/hr over 30 Minutes Intravenous Every 24 hours 04/25/19 0924 04/29/19 0758   04/26/19 0800  vancomycin (VANCOCIN) IVPB 1000 mg/200 mL premix  Status:  Discontinued     1,000 mg 200 mL/hr over 60 Minutes Intravenous Every 24 hours 04/25/19 0659 04/28/19 0741   04/25/19 1800  ceFEPIme (MAXIPIME) 2 g in sodium chloride 0.9 % 100 mL IVPB  Status:  Discontinued     2 g 200 mL/hr over 30 Minutes Intravenous Every 12 hours 04/25/19 0658 04/27/19 1014   04/25/19 1200  remdesivir 200 mg in sodium chloride 0.9 % 250 mL IVPB     200 mg 500 mL/hr over 30 Minutes Intravenous Once 04/25/19 0924 04/25/19 1220   04/25/19 0615  vancomycin (VANCOCIN) 2,250 mg in sodium chloride  0.9 % 500 mL IVPB     2,250 mg 250 mL/hr over 120 Minutes Intravenous STAT 04/25/19 0613 04/25/19 0956   04/25/19 0615  ceFEPIme (MAXIPIME) 2 g in sodium chloride 0.9 % 100 mL IVPB     2 g 200 mL/hr over 30 Minutes Intravenous STAT 04/25/19 0613 04/25/19 0704        Subjective: Patient seems to be upset this morning due to her mouth pain.  Shortness of breath persists.  Denies any chest pain.  No nausea vomiting.  Objective: Vitals:   05/14/19 0444 05/14/19 0730 05/14/19 0900 05/14/19 0905  BP: (!) 121/51 104/69    Pulse: 86 82    Resp: 20 20    Temp: 98 F (36.7 C) (!) 97.5 F (36.4 C)    TempSrc: Oral Oral    SpO2: 96% 91% (!) 81% 92%  Weight:      Height:        Intake/Output Summary (Last 24 hours) at 05/14/2019 1159 Last data filed at 05/14/2019 0800 Gross per 24 hour  Intake 1308 ml  Output 1600 ml  Net -292 ml   Filed Weights   04/27/2019 2200 05/02/19 0600  Weight: 102.1 kg 101.4 kg    Examination:   General appearance: Awake alert.  In no distress Ulcerations noted in the oral cavity  Resp: Mildly tachypneic at rest.  Coarse breath sounds with crackles bilaterally.  No wheezing or rhonchi. Cardio: S1-S2 is normal regular.  No S3-S4.  No rubs murmurs or bruit GI: Abdomen is soft.  Nontender nondistended.  Bowel sounds are present normal.  No masses organomegaly Extremities: Edema noted bilateral lower extremities.. Neurologic: Alert and oriented x3.  No focal neurological deficits.      Data Reviewed: I have personally reviewed following labs and imaging studies  CBC: Recent Labs  Lab 05/08/19 1700 05/10/19 0522 05/11/19 0625 05/12/19 0849 05/13/19 0126 05/14/19 0500  WBC 20.1* 14.7* 13.7* 12.2* 12.0* 10.7*  NEUTROABS 14.3* 10.2* 10.0* 8.6* 8.1*  --   HGB 13.1 11.9* 12.0 11.5* 11.4* 11.1*  HCT 39.6 37.6 37.0 36.8 37.0 35.6*  MCV 95.2 97.9 98.7 100.0 100.8* 100.8*  PLT 256 219 195 189 167 [id number]*   Basic Metabolic Panel: Recent Labs  Lab  05/10/19 0522 05/11/19 0625 05/12/19 0849 05/13/19 0126 05/14/19 0500  NA 129* 130* 134* 134* 134*  K 4.8 4.7 5.0 4.5 4.2  CL 90* 94* 95* 92* 90*  CO2 30 29 30  34* 36*  GLUCOSE 167* 130* 129* 138* 136*  BUN 16 12 13 19 16   CREATININE 0.56 0.43* 0.48 0.57 0.52  CALCIUM 8.7* 8.6* 9.1 9.1 9.4   GFR: Estimated Creatinine Clearance: 69.5 mL/min (by C-G formula based on SCr of 0.52 mg/dL).  CBG: Recent Labs  Lab 05/13/19 1132 05/13/19 1647 05/13/19 1951 05/14/19 0807 05/14/19 1148  GLUCAP 134* 133* 167* 128* 107*      Radiology Studies: I have reviewed all of the imaging during this hospital visit personally     Scheduled Meds:  aspirin EC  325 mg Oral Daily   atorvastatin  40 mg Oral q1800   Chlorhexidine Gluconate Cloth  6 each Topical Daily   chlorpheniramine-HYDROcodone  5 mL Oral Q12H   cholecalciferol  1,000 Units Oral Daily   enoxaparin (LOVENOX) injection  0.5 mg/kg Subcutaneous Q24H   furosemide  80 mg Intravenous Q12H   gabapentin  600 mg Oral TID WC & HS   insulin aspart  0-20 Units Subcutaneous TID WC   Ipratropium-Albuterol  1 puff Inhalation Q6H   magic mouthwash w/lidocaine  10 mL Oral TID   mouth rinse  15 mL Mouth Rinse BID   metoprolol succinate  50 mg Oral Daily   nystatin  5 mL Mouth/Throat QID   pantoprazole  40 mg Oral Daily   pneumococcal 23 valent vaccine  0.5 mL Intramuscular Tomorrow-1000   pyridOXINE  50 mg Oral Daily   sertraline  50 mg Oral Daily   traZODone  50 mg Oral QHS   vitamin C  500 mg Oral Daily   zinc sulfate  220 mg Oral Daily   Continuous Infusions:    LOS: 19 days     Bonnielee Haff, MD Pager: [email or web address]

## 2019-05-15 LAB — BASIC METABOLIC PANEL
Anion gap: 10 (ref 5–15)
BUN: 19 mg/dL (ref 8–23)
CO2: 35 mmol/L — ABNORMAL HIGH (ref 22–32)
Calcium: 9.3 mg/dL (ref 8.9–10.3)
Chloride: 86 mmol/L — ABNORMAL LOW (ref 98–111)
Creatinine, Ser: 0.53 mg/dL (ref 0.44–1.00)
GFR calc Af Amer: >60 mL/min (ref >60)
GFR calc non Af Amer: >60 mL/min (ref >60)
Glucose, Bld: 140 mg/dL — ABNORMAL HIGH (ref 70–99)
Potassium: 4.2 mmol/L (ref 3.5–5.1)
Sodium: 131 mmol/L — ABNORMAL LOW (ref 135–145)

## 2019-05-15 LAB — CBC
HCT: 35.9 % — ABNORMAL LOW (ref 36.0–46.0)
Hemoglobin: 11.4 g/dL — ABNORMAL LOW (ref 12.0–15.0)
MCH: 31.9 pg (ref 26.0–34.0)
MCHC: 31.8 g/dL (ref 30.0–36.0)
MCV: 100.6 fL — ABNORMAL HIGH (ref 80.0–100.0)
Platelets: 133 10*3/uL — ABNORMAL LOW (ref 150–400)
RBC: 3.57 MIL/uL — ABNORMAL LOW (ref 3.87–5.11)
RDW: 15.6 % — ABNORMAL HIGH (ref 11.5–15.5)
WBC: 11.9 10*3/uL — ABNORMAL HIGH (ref 4.0–10.5)
nRBC: 0 % (ref 0.0–0.2)

## 2019-05-15 LAB — GLUCOSE, CAPILLARY
Glucose-Capillary: 155 mg/dL — ABNORMAL HIGH (ref 70–99)
Glucose-Capillary: 127 mg/dL — ABNORMAL HIGH (ref 70–99)
Glucose-Capillary: 127 mg/dL — ABNORMAL HIGH (ref 70–99)
Glucose-Capillary: 109 mg/dL — ABNORMAL HIGH (ref 70–99)

## 2019-05-15 MED ORDER — METOPROLOL SUCCINATE ER 25 MG PO TB24
25.0000 mg | ORAL_TABLET | Freq: Every day | ORAL | Status: DC
  Administered 2019-05-16 – 2019-05-27 (×12): 25 mg via ORAL
  Filled 2019-05-15 (×12): qty 1

## 2019-05-15 MED ORDER — MORPHINE SULFATE (PF) 2 MG/ML IV SOLN
2.0000 mg | INTRAVENOUS | Status: DC | PRN
  Administered 2019-05-17 – 2019-05-24 (×2): 2 mg via INTRAVENOUS
  Filled 2019-05-15 (×2): qty 1

## 2019-05-15 MED ORDER — FUROSEMIDE 10 MG/ML IJ SOLN
80.0000 mg | Freq: Two times a day (BID) | INTRAMUSCULAR | Status: DC
  Administered 2019-05-16 – 2019-05-18 (×5): 80 mg via INTRAVENOUS
  Filled 2019-05-15 (×5): qty 8

## 2019-05-15 NOTE — Progress Notes (Signed)
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 She does have significant past medical history for hypertension, dyslipidemia, TIA, GERD, anxiety, depression and arthritis. She tested positive for COVID-19 November 6. [name] a nursing home resident.Patient had a dry cough, she was quarantined at a skilled nursing facility. On the day of hospitalization she was noted to be hypoxic, requiring supplemental oxygen per nasal cannula. On her initial physical examination her oximetry was 76% on room air, blood pressure was 101/73, pulse rate 76, respirate 15, oxygen saturation 95% on 6 L of supplemental oxygen per nasal cannula.She had coarse breath sounds bilaterally, heart S1-S2 present rhythmic, abdomen soft, no lower extremity edema. Her chest radiograph had bilateral interstitial infiltrates, right upper lobe and left upper lobe.  Patient was admitted to the hospital working diagnosis of acute hypoxic respiratory failure due to SARS COVID-19 viral pneumonia.   Assessment & Plan:   Acute hypoxic respiratory failure due to SARS COVID 19 viral pneumonia/secondary bacterial infection Patient received Actemra 11/23. Completed remdesivir on 11/25. Patient has also completed course of dexamethasone.  Patient continues to require high amounts of oxygen.  Chest x-ray continues to show significant bilateral infiltrates.  Dose of Lasix was increased yesterday.  She recently completed course of cefepime as well.  Continue with incentive spirometry.  PT and OT.  We will repeat films either tomorrow or Sunday to see if there is any improvement after aggressive diuresis.  If not she may need to undergo a CT scan.    Acute diastolic heart failure decompensation Please also see discussion above.  Concern for fluid overload.  Continue with furosemide.   Echocardiogram showed grade 1 diastolic dysfunction with normal systolic function.  Strict ins and outs.  Daily weights.  Hyponatremia Noted to be lower today.  Possibly due to diuretics.  Recheck tomorrow.  Acute metabolic encephalopathy/history of depression Mentation seems to be back to baseline.  Continue with sertraline and trazodone.    Essential hypertension  No low readings noted.  Noted to be on metoprolol.  Also getting furosemide as discussed above.  Cut back on the dose of metoprolol.  Diabetes mellitus type 2 HbA1c 8.4.  Continue to monitor CBGs.  Due to low glucose levels her Levemir was discontinued.  CBGs have stabilized.  Dyslipidemia On atorvastatin.   Macrocytic anemia No overt bleeding noted.  Continue to monitor.  Hemoglobin is stable.  Obesity Estimated body mass index is 36.47 kg/m as calculated from the following:   Height as of this encounter: 5\' 5"  (1.651 m).   Weight as of this encounter: 99.4 kg.  Goals of care Patient was told that she needed to cooperate and work with physical and Occupational Therapy.  She says that she is somewhat depressed due to significant ulcerations in her oral cavity and the resultant pain.  Will increase her Magic mouthwash dose.  Prognosis is guarded.  If there is no improvement in the next 48 hours we will do CT scan of her chest to further evaluate her lungs.  And then we may have to have further discussions regarding goals of care.  DVT prophylaxis:enoxaparin Code Status: Full code Family Communication:Did speak to son yesterday.  Will call him again today. Disposition Plan:Prognosis is guarded.  She will need to go to skilled nursing facility at discharge.     Consultants:   None  Procedures:  Transthoracic echocardiogram 05/12/2019  1. Small circumferential pericardial effusion.  2. Left ventricular ejection fraction, by visual estimation, is 65 to 70%. The left ventricle has hyperdynamic function. There  is mildly increased left ventricular hypertrophy.  3. Left ventricular diastolic parameters are consistent with Grade I diastolic dysfunction (impaired relaxation).  4. The left ventricle has no regional wall motion abnormalities.  5. Global right ventricle has normal systolic function.The right ventricular size is normal. No increase in right ventricular wall thickness.  6. The aortic valve is tricuspid. Aortic valve regurgitation is not visualized. Mild aortic valve sclerosis without stenosis.  7. Left atrial size was normal.  8. Right atrial size was normal.  9. The tricuspid valve is normal in structure. Tricuspid valve regurgitation is not demonstrated. 10. The mitral valve is normal in structure. No evidence of mitral valve regurgitation. No evidence of mitral stenosis. 11. The inferior vena cava is normal in size with greater than 50% respiratory variability, suggesting right atrial pressure of 3 mmHg. 12. TR signal is inadequate for assessing pulmonary artery systolic pressure.   Antimicrobials:  Anti-infectives (From admission, onward)   Start     Dose/Rate Route Frequency Ordered Stop   05/05/19 1500  ceFEPIme (MAXIPIME) 2 g in sodium chloride 0.9 % 100 mL IVPB     2 g 200 mL/hr over 30 Minutes Intravenous Every 8 hours 05/05/19 1451 05/12/19 2216   04/27/19 1400  ceFEPIme (MAXIPIME) 2 g in sodium chloride 0.9 % 100 mL IVPB  Status:  Discontinued     2 g 200 mL/hr over 30 Minutes Intravenous Every 8 hours 04/27/19 1014 04/28/19 0741   04/26/19 1000  remdesivir 100 mg in sodium chloride 0.9 % 250 mL IVPB     100 mg 500 mL/hr over 30 Minutes Intravenous Every 24 hours 04/25/19 0924 04/29/19 0758   04/26/19 0800  vancomycin (VANCOCIN) IVPB 1000 mg/200 mL premix  Status:  Discontinued     1,000 mg 200 mL/hr over 60 Minutes Intravenous Every 24 hours 04/25/19 0659 04/28/19 0741   04/25/19 1800  ceFEPIme (MAXIPIME) 2 g in sodium chloride 0.9 % 100 mL IVPB  Status:  Discontinued      2 g 200 mL/hr over 30 Minutes Intravenous Every 12 hours 04/25/19 0658 04/27/19 1014   04/25/19 1200  remdesivir 200 mg in sodium chloride 0.9 % 250 mL IVPB     200 mg 500 mL/hr over 30 Minutes Intravenous Once 04/25/19 0924 04/25/19 1220   04/25/19 0615  vancomycin (VANCOCIN) 2,250 mg in sodium chloride 0.9 % 500 mL IVPB     2,250 mg 250 mL/hr over 120 Minutes Intravenous STAT 04/25/19 0613 04/25/19 0956   04/25/19 0615  ceFEPIme (MAXIPIME) 2 g in sodium chloride 0.9 % 100 mL IVPB     2 g 200 mL/hr over 30 Minutes Intravenous STAT 04/25/19 0613 04/25/19 0704        Subjective: Patient mentions that she still short of breath.  Still complaining of pain in her mouth.  Denies any chest pain.  Objective: Vitals:   05/15/19 0741 05/15/19 0900 05/15/19 0905 05/15/19 1052  BP:      Pulse:      Resp: 20     Temp:      TempSrc:      SpO2:  (!) 83% 93% 93%  Weight:      Height:        Intake/Output Summary (Last 24 hours) at 05/15/2019 1151 Last data filed at 05/15/2019  1030 Gross per 24 hour  Intake 1166 ml  Output 1350 ml  Net -184 ml   Filed Weights   04/05/2019 2200 05/02/19 0600 05/15/19 0500  Weight: 102.1 kg 101.4 kg 99.4 kg    Examination:   General appearance: Awake alert.  Noted to be slightly lethargic. Ulcerations noted in oral cavity. Resp: Coarse breath sound bilaterally.  Crackles bilaterally.  No wheezing or rhonchi. Cardio: S1-S2 is normal regular.  No S3-S4.  No rubs murmurs or bruit GI: Abdomen is soft.  Nontender nondistended.  Bowel sounds are present normal.  No masses organomegaly Extremities: Edema noted bilateral lower extremity.  Slightly improved. Neurologic: Alert and oriented x3.  No focal neurological deficits.      Data Reviewed: I have personally reviewed following labs and imaging studies  CBC: Recent Labs  Lab 05/08/19 1700 05/10/19 0522 05/11/19 0625 05/12/19 0849 05/13/19 0126 05/14/19 0500 05/15/19 0220  WBC 20.1* 14.7*  13.7* 12.2* 12.0* 10.7* 11.9*  NEUTROABS 14.3* 10.2* 10.0* 8.6* 8.1*  --   --   HGB 13.1 11.9* 12.0 11.5* 11.4* 11.1* 11.4*  HCT 39.6 37.6 37.0 36.8 37.0 35.6* 35.9*  MCV 95.2 97.9 98.7 100.0 100.8* 100.8* 100.6*  PLT 256 219 195 189 167 140* [id number]*   Basic Metabolic Panel: Recent Labs  Lab 05/11/19 0625 05/12/19 0849 05/13/19 0126 05/14/19 0500 05/15/19 0220  NA 130* 134* 134* 134* 131*  K 4.7 5.0 4.5 4.2 4.2  CL 94* 95* 92* 90* 86*  CO2 29 30 34* 36* 35*  GLUCOSE 130* 129* 138* 136* 140*  BUN 12 13 19 16 19   CREATININE 0.43* 0.48 0.57 0.52 0.53  CALCIUM 8.6* 9.1 9.1 9.4 9.3   GFR: Estimated Creatinine Clearance: 68.8 mL/min (by C-G formula based on SCr of 0.53 mg/dL).  CBG: Recent Labs  Lab 05/14/19 0807 05/14/19 1148 05/14/19 1633 05/14/19 2100 05/15/19 0719  GLUCAP 128* 107* 166* 142* 155*      Radiology Studies: I have reviewed all of the imaging during this hospital visit personally     Scheduled Meds: . aspirin EC  325 mg Oral Daily  . atorvastatin  40 mg Oral q1800  . Chlorhexidine Gluconate Cloth  6 each Topical Daily  . chlorpheniramine-HYDROcodone  5 mL Oral Q12H  . cholecalciferol  1,000 Units Oral Daily  . enoxaparin (LOVENOX) injection  0.5 mg/kg Subcutaneous Q24H  . furosemide  80 mg Intravenous Q12H  . gabapentin  600 mg Oral TID WC & HS  . insulin aspart  0-20 Units Subcutaneous TID WC  . Ipratropium-Albuterol  1 puff Inhalation Q6H  . magic mouthwash w/lidocaine  10 mL Oral TID  . mouth rinse  15 mL Mouth Rinse BID  . metoprolol succinate  50 mg Oral Daily  . multivitamin with minerals  1 tablet Oral Daily  . nystatin  5 mL Mouth/Throat QID  . pantoprazole  40 mg Oral Daily  . pneumococcal 23 valent vaccine  0.5 mL Intramuscular Tomorrow-1000  . pyridOXINE  50 mg Oral Daily  . sertraline  50 mg Oral Daily  . traZODone  50 mg Oral QHS  . vitamin C  500 mg Oral Daily   Continuous Infusions:    LOS: 20 days     Bonnielee Haff,  MD Pager: [email or web address]

## 2019-05-15 NOTE — TOC Progression Note (Signed)
Transition of Care [facility]) - Progression Note    Patient Details  Name: [name] MRN: [id number] Date of Birth: Oct 24, 1940  Transition of Care [facility]) CM/SW Contact  Eileen Stanford, LCSW Phone Number: 05/15/2019, 10:44 AM  Clinical Narrative:  Pt can only accept pt if they are at [address] [place] or less.     Expected Discharge Plan: [place] Barriers to Discharge: Continued Medical Work up  Expected Discharge Plan and Services Expected Discharge Plan: [place] arrangements for the past 2 months: [place]                 DME Arranged: N/A         HH Arranged: NA           Social Determinants of Health (SDOH) Interventions    Readmission Risk Interventions No flowsheet data found.

## 2019-05-15 NOTE — Progress Notes (Signed)
Son called to update, no answer at this time.

## 2019-05-16 ENCOUNTER — Inpatient Hospital Stay (HOSPITAL_COMMUNITY): Payer: Medicare HMO

## 2019-05-16 LAB — BASIC METABOLIC PANEL
Anion gap: 11 (ref 5–15)
BUN: 17 mg/dL (ref 8–23)
CO2: 37 mmol/L — ABNORMAL HIGH (ref 22–32)
Calcium: 9.4 mg/dL (ref 8.9–10.3)
Chloride: 84 mmol/L — ABNORMAL LOW (ref 98–111)
Creatinine, Ser: 0.62 mg/dL (ref 0.44–1.00)
GFR calc Af Amer: >60 mL/min (ref >60)
GFR calc non Af Amer: >60 mL/min (ref >60)
Glucose, Bld: 131 mg/dL — ABNORMAL HIGH (ref 70–99)
Potassium: 4.1 mmol/L (ref 3.5–5.1)
Sodium: 132 mmol/L — ABNORMAL LOW (ref 135–145)

## 2019-05-16 LAB — CBC
HCT: 35 % — ABNORMAL LOW (ref 36.0–46.0)
Hemoglobin: 11 g/dL — ABNORMAL LOW (ref 12.0–15.0)
MCH: 31.6 pg (ref 26.0–34.0)
MCHC: 31.4 g/dL (ref 30.0–36.0)
MCV: 100.6 fL — ABNORMAL HIGH (ref 80.0–100.0)
Platelets: 121 10*3/uL — ABNORMAL LOW (ref 150–400)
RBC: 3.48 MIL/uL — ABNORMAL LOW (ref 3.87–5.11)
RDW: 15.7 % — ABNORMAL HIGH (ref 11.5–15.5)
WBC: 12.1 10*3/uL — ABNORMAL HIGH (ref 4.0–10.5)
nRBC: 0 % (ref 0.0–0.2)

## 2019-05-16 LAB — GLUCOSE, CAPILLARY
Glucose-Capillary: 124 mg/dL — ABNORMAL HIGH (ref 70–99)
Glucose-Capillary: 137 mg/dL — ABNORMAL HIGH (ref 70–99)
Glucose-Capillary: 122 mg/dL — ABNORMAL HIGH (ref 70–99)
Glucose-Capillary: 224 mg/dL — ABNORMAL HIGH (ref 70–99)

## 2019-05-16 LAB — C-REACTIVE PROTEIN: CRP: 0.5 mg/dL (ref <1.0)

## 2019-05-16 MED ORDER — BISACODYL 10 MG RE SUPP
10.0000 mg | Freq: Once | RECTAL | Status: AC
  Administered 2019-05-16: 10 mg via RECTAL
  Filled 2019-05-16: qty 1

## 2019-05-16 MED ORDER — FLEET ENEMA 7-19 GM/118ML RE ENEM
1.0000 | ENEMA | Freq: Once | RECTAL | Status: AC
  Administered 2019-05-16: 1 via RECTAL
  Filled 2019-05-16: qty 1

## 2019-05-16 MED ORDER — METHYLPREDNISOLONE SODIUM SUCC 125 MG IJ SOLR
60.0000 mg | Freq: Two times a day (BID) | INTRAMUSCULAR | Status: DC
  Administered 2019-05-16 – 2019-05-20 (×8): 60 mg via INTRAVENOUS
  Filled 2019-05-16 (×8): qty 2

## 2019-05-16 NOTE — Plan of Care (Signed)
  Problem: Coping: Goal: Psychosocial and spiritual needs will be supported Outcome: Progressing   Problem: Respiratory: Goal: Will maintain a patent airway Outcome: Progressing Goal: Complications related to the disease process, condition or treatment will be avoided or minimized Outcome: Progressing   Problem: Clinical Measurements: Goal: Ability to maintain clinical measurements within normal limits will improve Outcome: Progressing Goal: Will remain free from infection Outcome: Progressing Goal: Respiratory complications will improve Outcome: Progressing Goal: Cardiovascular complication will be avoided Outcome: Progressing   Problem: Activity: Goal: Risk for activity intolerance will decrease Outcome: Progressing   Problem: Nutrition: Goal: Adequate nutrition will be maintained Outcome: Progressing   Problem: Coping: Goal: Level of anxiety will decrease Outcome: Progressing   Problem: Elimination: Goal: Will not experience complications related to urinary retention Outcome: Progressing   Problem: Pain Managment: Goal: General experience of comfort will improve Outcome: Progressing   Problem: Safety: Goal: Ability to remain free from injury will improve Outcome: Progressing

## 2019-05-16 NOTE — Progress Notes (Signed)
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 She does have significant past medical history for hypertension, dyslipidemia, TIA, GERD, anxiety, depression and arthritis. She tested positive for COVID-19 November 6. [name] a nursing home resident.Patient had a dry cough, she was quarantined at a skilled nursing facility. On the day of hospitalization she was noted to be hypoxic, requiring supplemental oxygen per nasal cannula. On her initial physical examination her oximetry was 76% on room air, blood pressure was 101/73, pulse rate 76, respirate 15, oxygen saturation 95% on 6 L of supplemental oxygen per nasal cannula.She had coarse breath sounds bilaterally, heart S1-S2 present rhythmic, abdomen soft, no lower extremity edema. Her chest radiograph had bilateral interstitial infiltrates, right upper lobe and left upper lobe.  Patient was admitted to the hospital working diagnosis of acute hypoxic respiratory failure due to SARS COVID-19 viral pneumonia.   Assessment & Plan:   Acute hypoxic respiratory failure due to SARS COVID 19 viral pneumonia/secondary bacterial infection Patient received Actemra 11/23. Completed remdesivir on 11/25. Patient has also completed course of dexamethasone. She also recently completed a course of cefepime.  CRP is normal.  Patient continues to have high oxygen requirements despite several days of diuretics.  Chest x-ray continues to show bilateral infiltrates.  Guarded prognosis discussed with the patient.  She does not want to be intubated if she continues to worsen.  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  I did offer her CT scan and she would like to proceed.  This will show [name] the extent of the disease in her lungs.  We may need to consider restarting steroids depending on  the findings on the CT scan.  Continue with incentive spirometry and mobilization as much as possible.  ADDENDUM CT scan shows extensive groundglass opacity in both the lungs.  This is the likely reason for her persistent hypoxia and high oxygen requirements.  Continue with Lasix for now.  Waiting on venous Doppler studies as well.  We will give her a trial of steroids again.  Discussed the findings with patient's son.  He agrees to DNR status.  Acute diastolic heart failure decompensation Please also see discussion above.  Concern for fluid overload.  Continue with furosemide.  Echocardiogram showed grade 1 diastolic dysfunction with normal systolic function.  Continue with strict ins and outs and daily weights.  Hyponatremia Sodium level is low but stable.  Possibly due to diuretics.  Continue to monitor.    Acute metabolic encephalopathy/history of depression Mentation seems to be back to baseline.  Continue with sertraline and trazodone.    Essential hypertension  No low readings noted.  Noted to be on metoprolol.  Also getting furosemide as discussed above.  Metoprolol dose was decreased.  Diabetes mellitus type 2 HbA1c 8.4.  Continue to monitor CBGs.  Due to low glucose levels her Levemir was discontinued.  CBGs have stabilized.  Dyslipidemia On atorvastatin.   Macrocytic anemia No overt bleeding noted.  Continue to monitor.  Hemoglobin is stable.  Obesity Estimated body mass index is 36.47 kg/m as calculated from the following:   Height as of this encounter: 5\' 5"  (1.651 m).   Weight as of this encounter: 99.4 kg.  Goals of care [place] Plate discussion with patient today.  She understands the severity of her illness.  She states that  she is still short of breath.  She understands that we are trying to diurese her.  [name] agreeable to CT scan.  She does not want to be intubated or undergo CPR if things were to get worse.  We will change her to DNR.  However she would like for  me to communicate these to her son as well.  We will do so.  Prognosis remains guarded.    DVT prophylaxis:enoxaparin Code Status: Changed to DNR after confirming with son. Family Communication:Son being updated on a daily basis Disposition Plan:Guarded prognosis.  CT scan of the lungs today.   Consultants:   None  Procedures:  Transthoracic echocardiogram 05/12/2019  1. Small circumferential pericardial effusion.  2. Left ventricular ejection fraction, by visual estimation, is 65 to 70%. The left ventricle has hyperdynamic function. There is mildly increased left ventricular hypertrophy.  3. Left ventricular diastolic parameters are consistent with Grade I diastolic dysfunction (impaired relaxation).  4. The left ventricle has no regional wall motion abnormalities.  5. Global right ventricle has normal systolic function.The right ventricular size is normal. No increase in right ventricular wall thickness.  6. The aortic valve is tricuspid. Aortic valve regurgitation is not visualized. Mild aortic valve sclerosis without stenosis.  7. Left atrial size was normal.  8. Right atrial size was normal.  9. The tricuspid valve is normal in structure. Tricuspid valve regurgitation is not demonstrated. 10. The mitral valve is normal in structure. No evidence of mitral valve regurgitation. No evidence of mitral stenosis. 11. The inferior vena cava is normal in size with greater than 50% respiratory variability, suggesting right atrial pressure of 3 mmHg. 12. TR signal is inadequate for assessing pulmonary artery systolic pressure.   Antimicrobials:  Anti-infectives (From admission, onward)   Start     Dose/Rate Route Frequency Ordered Stop   05/05/19 1500  ceFEPIme (MAXIPIME) 2 g in sodium chloride 0.9 % 100 mL IVPB     2 g 200 mL/hr over 30 Minutes Intravenous Every 8 hours 05/05/19 1451 05/12/19 2216   04/27/19 1400  ceFEPIme (MAXIPIME) 2 g in sodium chloride 0.9 % 100 mL IVPB  Status:   Discontinued     2 g 200 mL/hr over 30 Minutes Intravenous Every 8 hours 04/27/19 1014 04/28/19 0741   04/26/19 1000  remdesivir 100 mg in sodium chloride 0.9 % 250 mL IVPB     100 mg 500 mL/hr over 30 Minutes Intravenous Every 24 hours 04/25/19 0924 04/29/19 0758   04/26/19 0800  vancomycin (VANCOCIN) IVPB 1000 mg/200 mL premix  Status:  Discontinued     1,000 mg 200 mL/hr over 60 Minutes Intravenous Every 24 hours 04/25/19 0659 04/28/19 0741   04/25/19 1800  ceFEPIme (MAXIPIME) 2 g in sodium chloride 0.9 % 100 mL IVPB  Status:  Discontinued     2 g 200 mL/hr over 30 Minutes Intravenous Every 12 hours 04/25/19 0658 04/27/19 1014   04/25/19 1200  remdesivir 200 mg in sodium chloride 0.9 % 250 mL IVPB     200 mg 500 mL/hr over 30 Minutes Intravenous Once 04/25/19 0924 04/25/19 1220   04/25/19 0615  vancomycin (VANCOCIN) 2,250 mg in sodium chloride 0.9 % 500 mL IVPB     2,250 mg 250 mL/hr over 120 Minutes Intravenous STAT 04/25/19 0613 04/25/19 0956   04/25/19 0615  ceFEPIme (MAXIPIME) 2 g in sodium chloride 0.9 % 100 mL IVPB     2 g 200 mL/hr over 30 Minutes Intravenous STAT 04/25/19  [id number] 04/25/19 0704        Subjective: Patient states that she remains short of breath.  [name] feeling miserable.  Denies any chest pain.  Overall she feels quite fatigued.  Objective: Vitals:   05/15/19 1950 05/16/19 0454 05/16/19 0700 05/16/19 1027  BP: 97/75 (!) 152/76 (!) 113/56 129/69  Pulse: (!) 105 (!) 105 (!) 105 (!) 113  Resp: 20  (!) 21   Temp: 98.6 F (37 C) 97.9 F (36.6 C) (!) 97.4 F (36.3 C)   TempSrc: Oral Oral Axillary   SpO2: 92% 91% 98%   Weight:      Height:        Intake/Output Summary (Last 24 hours) at 05/16/2019 1211 Last data filed at 05/16/2019 0700 Gross per 24 hour  Intake 684 ml  Output 800 ml  Net -116 ml   Filed Weights   04/22/2019 2200 05/02/19 0600 05/15/19 0500  Weight: 102.1 kg 101.4 kg 99.4 kg    Examination:   General appearance: Awake alert.   Fatigued. Ulcerations in oral cavity. Resp: Tachypneic.  Coarse breath sounds.  Crackles bilaterally.  No wheezing or rhonchi. Cardio: S1-S2 is normal regular.  No S3-S4.  No rubs murmurs or bruit GI: Abdomen is soft.  Nontender nondistended.  Bowel sounds are present normal.  No masses organomegaly Extremities: Continues to have edema bilateral lower extremities. Neurologic: Alert and oriented x3.  No focal neurological deficits.       Data Reviewed: I have personally reviewed following labs and imaging studies  CBC: Recent Labs  Lab 05/10/19 0522 05/11/19 0625 05/12/19 0849 05/13/19 0126 05/14/19 0500 05/15/19 0220 05/16/19 0505  WBC 14.7* 13.7* 12.2* 12.0* 10.7* 11.9* 12.1*  NEUTROABS 10.2* 10.0* 8.6* 8.1*  --   --   --   HGB 11.9* 12.0 11.5* 11.4* 11.1* 11.4* 11.0*  HCT 37.6 37.0 36.8 37.0 35.6* 35.9* 35.0*  MCV 97.9 98.7 100.0 100.8* 100.8* 100.6* 100.6*  PLT 219 195 189 167 140* 133* 123XX123*   Basic Metabolic Panel: Recent Labs  Lab 05/12/19 0849 05/13/19 0126 05/14/19 0500 05/15/19 0220 05/16/19 0505  NA 134* 134* 134* 131* 132*  K 5.0 4.5 4.2 4.2 4.1  CL 95* 92* 90* 86* 84*  CO2 30 34* 36* 35* 37*  GLUCOSE 129* 138* 136* 140* 131*  BUN 13 19 16 19 17   CREATININE 0.48 0.57 0.52 0.53 0.62  CALCIUM 9.1 9.1 9.4 9.3 9.4   GFR: Estimated Creatinine Clearance: 68.8 mL/min (by C-G formula based on SCr of 0.62 mg/dL).  CBG: Recent Labs  Lab 05/15/19 1205 05/15/19 1605 05/15/19 2250 05/16/19 0730 05/16/19 1137  GLUCAP 127* 127* 109* 124* 137*      Radiology Studies: DG CHEST PORT 1 VIEW  Result Date: 05/14/2019 CLINICAL DATA:  Congestive heart failure. EXAM: PORTABLE CHEST 1 VIEW COMPARISON:  May 11, 2019. FINDINGS: Right-sided PICC line is unchanged in position. Stable bilateral lung opacities are noted concerning for edema or pneumonia. No pneumothorax is noted. Cardiomediastinal silhouette is obscured. Bony thorax is unremarkable. IMPRESSION:  Stable bilateral lung opacities are noted concerning for edema or pneumonia. Electronically Signed   By: Marijo Conception M.D.   On: 05/14/2019 08:12   ECHOCARDIOGRAM COMPLETE  Result Date: 05/12/2019   ECHOCARDIOGRAM REPORT   Patient Name:   [name] Date of Exam: 05/12/2019 Medical Rec #:  [id number]       Height:       65.0 in Accession #:    [id number]  Weight:       223.5 lb Date of Birth:  10-09-1940       BSA:          2.07 m Patient Age:    36 years        BP:           104/60 mmHg Patient Gender: F               HR:           90 bpm. Exam Location:  Inpatient Procedure: 2D Echo, Color Doppler and Cardiac Doppler Indications:    R06.9 DOE  History:        Patient has prior history of Echocardiogram examinations, most                 recent 07/12/2015. Risk Factors:Hypertension and Dyslipidemia.                 Covid+ 04/25/19.  Sonographer:    Raquel Sarna Senior RDCS Referring Phys: [id number] [place]  Sonographer Comments: Technically difficult study due to poor echo windows and patient is morbidly obese. IMPRESSION  1. Small circumferential pericardial effusion.  2. Left ventricular ejection fraction, by visual estimation, is 65 to 70%. The left ventricle has hyperdynamic function. There is mildly increased left ventricular hypertrophy.  3. Left ventricular diastolic parameters are consistent with Grade I diastolic dysfunction (impaired relaxation).  4. The left ventricle has no regional wall motion abnormalities.  5. Global right ventricle has normal systolic function.The right ventricular size is normal. No increase in right ventricular wall thickness.  6. The aortic valve is tricuspid. Aortic valve regurgitation is not visualized. Mild aortic valve sclerosis without stenosis.  7. Left atrial size was normal.  8. Right atrial size was normal.  9. The tricuspid valve is normal in structure. Tricuspid valve regurgitation is not demonstrated. 10. The mitral valve is normal in structure. No  evidence of mitral valve regurgitation. No evidence of mitral stenosis. 11. The inferior vena cava is normal in size with greater than 50% respiratory variability, suggesting right atrial pressure of 3 mmHg. 12. TR signal is inadequate for assessing pulmonary artery systolic pressure. FINDINGS  Left Ventricle: Left ventricular ejection fraction, by visual estimation, is 65 to 70%. The left ventricle has hyperdynamic function. The left ventricle has no regional wall motion abnormalities. The left ventricular internal cavity size was the left ventricle is normal in size. There is mildly increased left ventricular hypertrophy. Left ventricular diastolic parameters are consistent with Grade I diastolic dysfunction (impaired relaxation). Right Ventricle: The right ventricular size is normal. No increase in right ventricular wall thickness. Global RV systolic function is has normal systolic function. Left Atrium: Left atrial size was normal in size. Right Atrium: Right atrial size was normal in size Pericardium: A small pericardial effusion is present. Small circumferential pericardial effusion. Mitral Valve: The mitral valve is normal in structure. No evidence of mitral valve stenosis by observation. No evidence of mitral valve regurgitation. Tricuspid Valve: The tricuspid valve is normal in structure. Tricuspid valve regurgitation is not demonstrated. Aortic Valve: The aortic valve is tricuspid. Aortic valve regurgitation is not visualized. Mild aortic valve sclerosis is present, with no evidence of aortic valve stenosis. Pulmonic Valve: The pulmonic valve was normal in structure. Pulmonic valve regurgitation is not visualized. Aorta: The aortic root is normal in size and structure. Venous: The inferior vena cava is normal in size with greater than 50% respiratory variability, suggesting right atrial pressure  of 3 mmHg. IAS/Shunts: No atrial level shunt detected by color flow Doppler.  LEFT VENTRICLE PLAX 2D LVIDd:          2.99 cm  Diastology LVIDs:         2.16 cm  LV e' lateral:   4.74 cm/s LV PW:         1.28 cm  LV E/e' lateral: 15.2 LV IVS:        1.39 cm  LV e' medial:    4.35 cm/s LVOT diam:     1.80 cm  LV E/e' medial:  16.6 LV SV:         19 ml LV SV Index:   8.74 LVOT Area:     2.54 cm  RIGHT VENTRICLE RV S prime:     11.00 cm/s TAPSE (M-mode): 1.5 cm LEFT ATRIUM           Index LA diam:      3.60 cm 1.74 cm/m LA Vol (A4C): 34.4 ml 16.59 ml/m  AORTIC VALVE LVOT Vmax:   114.00 cm/s LVOT Vmean:  76.600 cm/s LVOT VTI:    0.194 m  AORTA Ao Root diam: 2.70 cm Ao Asc diam:  2.60 cm MITRAL VALVE MV Area (PHT): 3.54 cm              SHUNTS MV PHT:        62.06 msec            Systemic VTI:  0.19 m MV Decel Time: 214 msec              Systemic Diam: 1.80 cm MV E velocity: 72.20 cm/s  103 cm/s MV A velocity: 125.00 cm/s 70.3 cm/s MV E/A ratio:  0.58        1.5  Loralie Champagne MD Electronically signed by Loralie Champagne MD Signature Date/Time: 05/12/2019/4:11:40 PM    Final         Scheduled Meds: . aspirin EC  325 mg Oral Daily  . atorvastatin  40 mg Oral q1800  . Chlorhexidine Gluconate Cloth  6 each Topical Daily  . chlorpheniramine-HYDROcodone  5 mL Oral Q12H  . cholecalciferol  1,000 Units Oral Daily  . enoxaparin (LOVENOX) injection  0.5 mg/kg Subcutaneous Q24H  . furosemide  80 mg Intravenous BID  . gabapentin  600 mg Oral TID WC & HS  . insulin aspart  0-20 Units Subcutaneous TID WC  . Ipratropium-Albuterol  1 puff Inhalation Q6H  . magic mouthwash w/lidocaine  10 mL Oral TID  . mouth rinse  15 mL Mouth Rinse BID  . metoprolol succinate  25 mg Oral Daily  . multivitamin with minerals  1 tablet Oral Daily  . nystatin  5 mL Mouth/Throat QID  . pantoprazole  40 mg Oral Daily  . pneumococcal 23 valent vaccine  0.5 mL Intramuscular Tomorrow-1000  . pyridOXINE  50 mg Oral Daily  . sertraline  50 mg Oral Daily  . traZODone  50 mg Oral QHS  . vitamin C  500 mg Oral Daily   Continuous Infusions:     LOS: 21 days     Bonnielee Haff, MD Pager: [email or web address]

## 2019-05-16 NOTE — TOC Progression Note (Signed)
Transition of Care [facility]) - Progression Note    Patient Details  Name: [name] MRN: [id number] Date of Birth: 1941/01/28  Transition of Care [facility]) CM/SW [place], [place] Phone Number: [phone] 05/16/2019, 9:29 AM  Clinical Narrative:     CSW acknowledges patient from [facility] and is long term care resident there. CSW notes patient is currently on 15L [facility]NC, [facility] reports they can take patient back once weaned to 5L, as they are unable to accommodate 15 L.   TOC team will continue to follow for discharge planning.   Expected Discharge Plan: [place] Barriers to Discharge: Continued Medical Work up  Expected Discharge Plan and Services Expected Discharge Plan: [place] arrangements for the past 2 months: [place]                 DME Arranged: N/A         HH Arranged: NA           Social Determinants of Health (SDOH) Interventions    Readmission Risk Interventions No flowsheet data found.

## 2019-05-17 LAB — GLUCOSE, CAPILLARY
Glucose-Capillary: 204 mg/dL — ABNORMAL HIGH (ref 70–99)
Glucose-Capillary: 244 mg/dL — ABNORMAL HIGH (ref 70–99)
Glucose-Capillary: 246 mg/dL — ABNORMAL HIGH (ref 70–99)

## 2019-05-17 LAB — BASIC METABOLIC PANEL
Anion gap: 13 (ref 5–15)
BUN: 21 mg/dL (ref 8–23)
CO2: 35 mmol/L — ABNORMAL HIGH (ref 22–32)
Calcium: 9.6 mg/dL (ref 8.9–10.3)
Chloride: 82 mmol/L — ABNORMAL LOW (ref 98–111)
Creatinine, Ser: 0.68 mg/dL (ref 0.44–1.00)
GFR calc Af Amer: >60 mL/min (ref >60)
GFR calc non Af Amer: >60 mL/min (ref >60)
Glucose, Bld: 248 mg/dL — ABNORMAL HIGH (ref 70–99)
Potassium: 4.4 mmol/L (ref 3.5–5.1)
Sodium: 130 mmol/L — ABNORMAL LOW (ref 135–145)

## 2019-05-17 LAB — CBC
HCT: 35.6 % — ABNORMAL LOW (ref 36.0–46.0)
Hemoglobin: 11.5 g/dL — ABNORMAL LOW (ref 12.0–15.0)
MCH: 31.5 pg (ref 26.0–34.0)
MCHC: 32.3 g/dL (ref 30.0–36.0)
MCV: 97.5 fL (ref 80.0–100.0)
Platelets: 150 10*3/uL (ref 150–400)
RBC: 3.65 MIL/uL — ABNORMAL LOW (ref 3.87–5.11)
RDW: 15.1 % (ref 11.5–15.5)
WBC: 13 10*3/uL — ABNORMAL HIGH (ref 4.0–10.5)
nRBC: 0 % (ref 0.0–0.2)

## 2019-05-17 LAB — D-DIMER, QUANTITATIVE: D-Dimer, Quant: 0.39 ug/mL-FEU (ref 0.00–0.50)

## 2019-05-17 MED ORDER — DOCUSATE SODIUM 100 MG PO CAPS
100.0000 mg | ORAL_CAPSULE | Freq: Two times a day (BID) | ORAL | Status: DC
  Administered 2019-05-17 – 2019-05-19 (×5): 100 mg via ORAL
  Filled 2019-05-17 (×5): qty 1

## 2019-05-17 MED ORDER — INSULIN ASPART 100 UNIT/ML ~~LOC~~ SOLN
0.0000 [IU] | Freq: Every day | SUBCUTANEOUS | Status: DC
  Administered 2019-05-17 – 2019-05-24 (×5): 2 [IU] via SUBCUTANEOUS

## 2019-05-17 MED ORDER — INSULIN ASPART 100 UNIT/ML ~~LOC~~ SOLN: 0.0000 [IU] | Freq: Three times a day (TID) | SUBCUTANEOUS | Status: DC

## 2019-05-17 MED ORDER — POLYETHYLENE GLYCOL 3350 17 G PO PACK
17.0000 g | PACK | Freq: Every day | ORAL | Status: DC
  Administered 2019-05-17 – 2019-05-19 (×3): 17 g via ORAL
  Filled 2019-05-17 (×3): qty 1

## 2019-05-17 NOTE — Progress Notes (Signed)
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 She does have significant past medical history for hypertension, dyslipidemia, TIA, GERD, anxiety, depression and arthritis. She tested positive for COVID-19 November 6. [name] a nursing home resident.Patient had a dry cough, she was quarantined at a skilled nursing facility. On the day of hospitalization she was noted to be hypoxic, requiring supplemental oxygen per nasal cannula. On her initial physical examination her oximetry was 76% on room air, blood pressure was 101/73, pulse rate 76, respirate 15, oxygen saturation 95% on 6 L of supplemental oxygen per nasal cannula.She had coarse breath sounds bilaterally, heart S1-S2 present rhythmic, abdomen soft, no lower extremity edema. Her chest radiograph had bilateral interstitial infiltrates, right upper lobe and left upper lobe.  Patient was admitted to the hospital working diagnosis of acute hypoxic respiratory failure due to SARS COVID-19 viral pneumonia.   Assessment & Plan:   Acute hypoxic respiratory failure due to SARS COVID 19 viral pneumonia/secondary bacterial infection Patient received Actemra 11/23. Completed remdesivir on 11/25. Patient also completed course of dexamethasone. She also recently completed a course of cefepime.  CRP is normal.  Despite treatments patient continues to have high oxygen requirements.  She remains on high flow nasal cannula at 13 to 15 L/min.  CT of the chest was done yesterday without contrast (due to contrast allergy) and it shows extensive groundglass opacities involving both lungs.  This is the most likely reason for her persistent hypoxia.  These findings were communicated to the patient and to her son.  Prognosis is guarded.  I have restarted steroids to see if that would make any difference.   D-dimer is normal at 0.39 and so venous thromboembolism is unlikely.  Lower extremity Doppler study is pending.  Continue with incentive spirometry and mobilization as much as possible.  Acute diastolic heart failure decompensation There was concern for fluid overload.  Patient was started on high-dose Lasix without much improvement in her oxygenation.  Echocardiogram showed grade 1 diastolic dysfunction with normal systolic function.  Continue with strict ins and outs and daily weights.  Continue with IV Lasix for now.  Hyponatremia Sodium level is low but stable.  Possibly due to diuretics.  Continue to monitor.    Acute metabolic encephalopathy/history of depression Mentation seems to be back to baseline.  Continue with sertraline and trazodone.    Essential hypertension  No low readings noted.  Noted to be on metoprolol.  Also getting furosemide as discussed above.  Metoprolol dose was decreased.  Diabetes mellitus type 2 HbA1c 8.4.  Continue to monitor CBGs.  Due to low glucose levels her Levemir was discontinued.  CBGs have stabilized.  Dyslipidemia On atorvastatin.   Macrocytic anemia No overt bleeding noted.  Continue to monitor.  Hemoglobin is stable.  Obesity Estimated body mass index is 35.77 kg/m as calculated from the following:   Height as of this encounter: 5\' 5"  (1.651 m).   Weight as of this encounter: 97.5 kg.  Goals of care Patient CT scan findings and her clinical course discussed with patient as well as her son.  Guarded prognosis was conveyed.  They were told that we are restarting steroids.  Patient understands the severity of her illness.  [name] a retired [employer] scientist.  She wanted to be DNR if things were to get even more worse.  Continue  to follow and monitor the situation.    DVT prophylaxis:enoxaparin Code Status: DNR Family Communication:Son being updated on a daily basis Disposition Plan:Prognosis is guarded.   Consultants:    None  Procedures:  Transthoracic echocardiogram 05/12/2019  1. Small circumferential pericardial effusion.  2. Left ventricular ejection fraction, by visual estimation, is 65 to 70%. The left ventricle has hyperdynamic function. There is mildly increased left ventricular hypertrophy.  3. Left ventricular diastolic parameters are consistent with Grade I diastolic dysfunction (impaired relaxation).  4. The left ventricle has no regional wall motion abnormalities.  5. Global right ventricle has normal systolic function.The right ventricular size is normal. No increase in right ventricular wall thickness.  6. The aortic valve is tricuspid. Aortic valve regurgitation is not visualized. Mild aortic valve sclerosis without stenosis.  7. Left atrial size was normal.  8. Right atrial size was normal.  9. The tricuspid valve is normal in structure. Tricuspid valve regurgitation is not demonstrated. 10. The mitral valve is normal in structure. No evidence of mitral valve regurgitation. No evidence of mitral stenosis. 11. The inferior vena cava is normal in size with greater than 50% respiratory variability, suggesting right atrial pressure of 3 mmHg. 12. TR signal is inadequate for assessing pulmonary artery systolic pressure.   Antimicrobials:  Anti-infectives (From admission, onward)   Start     Dose/Rate Route Frequency Ordered Stop   05/05/19 1500  ceFEPIme (MAXIPIME) 2 g in sodium chloride 0.9 % 100 mL IVPB     2 g 200 mL/hr over 30 Minutes Intravenous Every 8 hours 05/05/19 1451 05/12/19 2216   04/27/19 1400  ceFEPIme (MAXIPIME) 2 g in sodium chloride 0.9 % 100 mL IVPB  Status:  Discontinued     2 g 200 mL/hr over 30 Minutes Intravenous Every 8 hours 04/27/19 1014 04/28/19 0741   04/26/19 1000  remdesivir 100 mg in sodium chloride 0.9 % 250 mL IVPB     100 mg 500 mL/hr over 30 Minutes Intravenous Every 24 hours 04/25/19 0924 04/29/19 0758   04/26/19 0800  vancomycin (VANCOCIN) IVPB 1000  mg/200 mL premix  Status:  Discontinued     1,000 mg 200 mL/hr over 60 Minutes Intravenous Every 24 hours 04/25/19 0659 04/28/19 0741   04/25/19 1800  ceFEPIme (MAXIPIME) 2 g in sodium chloride 0.9 % 100 mL IVPB  Status:  Discontinued     2 g 200 mL/hr over 30 Minutes Intravenous Every 12 hours 04/25/19 0658 04/27/19 1014   04/25/19 1200  remdesivir 200 mg in sodium chloride 0.9 % 250 mL IVPB     200 mg 500 mL/hr over 30 Minutes Intravenous Once 04/25/19 0924 04/25/19 1220   04/25/19 0615  vancomycin (VANCOCIN) 2,250 mg in sodium chloride 0.9 % 500 mL IVPB     2,250 mg 250 mL/hr over 120 Minutes Intravenous STAT 04/25/19 0613 04/25/19 0956   04/25/19 0615  ceFEPIme (MAXIPIME) 2 g in sodium chloride 0.9 % 100 mL IVPB     2 g 200 mL/hr over 30 Minutes Intravenous STAT 04/25/19 0613 04/25/19 0704        Subjective: Patient continues to have significant shortness of breath.  She states that she is feeling miserable.  Denies any chest pain.  Feels fatigued.  Understands that her lungs are severely affected.  Objective: Vitals:   05/16/19 2200 05/17/19 0343 05/17/19 0754 05/17/19 0950  BP: 115/75 133/74  (!) 152/77  Pulse: (!) 111   99  Resp:    20  Temp:    98.1 F (36.7 C)  TempSrc:  Oral  Oral  SpO2: 92%  91% 97%  Weight:  97.5 kg    Height:       No intake or output data in the 24 hours ending 05/17/19 1125 Filed Weights   05/02/19 0600 05/15/19 0500 05/17/19 0343  Weight: 101.4 kg 99.4 kg 97.5 kg    Examination:   General appearance: Awake alert.  In no distress.  Fatigued Resp: Tachypneic at rest.  No use of accessory muscles.  Coarse breath sounds with crackles bilaterally.  No wheezing or rhonchi. Cardio: S1-S2 is normal regular.  No S3-S4.  No rubs murmurs or bruit GI: Abdomen is soft.  Nontender nondistended.  Bowel sounds are present normal.  No masses organomegaly Extremities: Bilateral lower extremity edema noted Neurologic: Alert and oriented x3.  No focal  neurological deficits.      Data Reviewed: I have personally reviewed following labs and imaging studies  CBC: Recent Labs  Lab 05/11/19 0625 05/12/19 0849 05/13/19 0126 05/14/19 0500 05/15/19 0220 05/16/19 0505 05/17/19 0958  WBC 13.7* 12.2* 12.0* 10.7* 11.9* 12.1* 13.0*  NEUTROABS 10.0* 8.6* 8.1*  --   --   --   --   HGB 12.0 11.5* 11.4* 11.1* 11.4* 11.0* 11.5*  HCT 37.0 36.8 37.0 35.6* 35.9* 35.0* 35.6*  MCV 98.7 100.0 100.8* 100.8* 100.6* 100.6* 97.5  PLT 195 189 167 140* 133* 121* [id number]   Basic Metabolic Panel: Recent Labs  Lab 05/13/19 0126 05/14/19 0500 05/15/19 0220 05/16/19 0505 05/17/19 0958  NA 134* 134* 131* 132* 130*  K 4.5 4.2 4.2 4.1 4.4  CL 92* 90* 86* 84* 82*  CO2 34* 36* 35* 37* 35*  GLUCOSE 138* 136* 140* 131* 248*  BUN 19 16 19 17 21   CREATININE 0.57 0.52 0.53 0.62 0.68  CALCIUM 9.1 9.4 9.3 9.4 9.6   GFR: Estimated Creatinine Clearance: 68.1 mL/min (by C-G formula based on SCr of 0.68 mg/dL).  CBG: Recent Labs  Lab 05/16/19 0730 05/16/19 1137 05/16/19 1629 05/16/19 2105 05/17/19 0726  GLUCAP 124* 137* 122* 224* 204*      Radiology Studies: CT CHEST WO CONTRAST  Result Date: 05/16/2019 CLINICAL DATA:  Shortness of breath.  COVID-19 pneumonia. EXAM: CT CHEST WITHOUT CONTRAST TECHNIQUE: Multidetector CT imaging of the chest was performed following the standard protocol without IV contrast. COMPARISON:  Chest radiograph, 05/14/2019 and older exams. Chest CT, 09/17/2014. FINDINGS: Cardiovascular: Heart is normal in size. No pericardial effusion. Mild left coronary artery calcifications. Mild aortic atherosclerotic calcifications. Mild prominence of the main pulmonary artery measuring 3.4 cm. Mediastinum/Nodes: No neck base or axillary masses or enlarged lymph nodes. Few prominent mediastinal lymph nodes, largest right paratracheal node just above the carinal measuring 1.2 cm in short axis. This is similar to the prior chest CT. No mediastinal  or hilar masses. Trachea and esophagus are unremarkable. Lungs/Pleura: There are extensive bilateral ground-glass airspace opacities. No lung mass nodule. No findings to suggest pulmonary edema. No pleural effusion or pneumothorax. Upper Abdomen: No acute abnormality. Musculoskeletal: No fracture or acute finding. No osteoblastic or osteolytic lesions. IMPRESSION: 1. Bilateral ground-glass lung opacities consistent with multifocal COVID-19 pneumonia. 2. No other acute abnormality. 3. Prominent main pulmonary artery. Possible degree of pulmonary hypertension. 4. Mild left coronary artery calcifications. Mild aortic atherosclerotic calcifications. Aortic Atherosclerosis (ICD10-I70.0). Electronically Signed   By: Lajean Manes M.D.   On: 05/16/2019 15:02   DG CHEST PORT 1 VIEW  Result Date:  05/14/2019 CLINICAL DATA:  Congestive heart failure. EXAM: PORTABLE CHEST 1 VIEW COMPARISON:  May 11, 2019. FINDINGS: Right-sided PICC line is unchanged in position. Stable bilateral lung opacities are noted concerning for edema or pneumonia. No pneumothorax is noted. Cardiomediastinal silhouette is obscured. Bony thorax is unremarkable. IMPRESSION: Stable bilateral lung opacities are noted concerning for edema or pneumonia. Electronically Signed   By: Marijo Conception M.D.   On: 05/14/2019 08:12        Scheduled Meds: . aspirin EC  325 mg Oral Daily  . atorvastatin  40 mg Oral q1800  . Chlorhexidine Gluconate Cloth  6 each Topical Daily  . chlorpheniramine-HYDROcodone  5 mL Oral Q12H  . cholecalciferol  1,000 Units Oral Daily  . docusate sodium  100 mg Oral BID  . enoxaparin (LOVENOX) injection  0.5 mg/kg Subcutaneous Q24H  . furosemide  80 mg Intravenous BID  . gabapentin  600 mg Oral TID WC & HS  . insulin aspart  0-20 Units Subcutaneous TID WC  . Ipratropium-Albuterol  1 puff Inhalation Q6H  . magic mouthwash w/lidocaine  10 mL Oral TID  . mouth rinse  15 mL Mouth Rinse BID  . methylPREDNISolone  (SOLU-MEDROL) injection  60 mg Intravenous Q12H  . metoprolol succinate  25 mg Oral Daily  . multivitamin with minerals  1 tablet Oral Daily  . nystatin  5 mL Mouth/Throat QID  . pantoprazole  40 mg Oral Daily  . pneumococcal 23 valent vaccine  0.5 mL Intramuscular Tomorrow-1000  . polyethylene glycol  17 g Oral Daily  . pyridOXINE  50 mg Oral Daily  . sertraline  50 mg Oral Daily  . traZODone  50 mg Oral QHS  . vitamin C  500 mg Oral Daily   Continuous Infusions:    LOS: 22 days     Bonnielee Haff, MD Pager: [email or web address]

## 2019-05-17 NOTE — Plan of Care (Signed)
  Problem: Pain Managment: Goal: General experience of comfort will improve Outcome: Progressing   

## 2019-05-18 ENCOUNTER — Inpatient Hospital Stay (HOSPITAL_COMMUNITY): Payer: Medicare HMO

## 2019-05-18 DIAGNOSIS — J9601 Acute respiratory failure with hypoxia: Secondary | ICD-10-CM

## 2019-05-18 DIAGNOSIS — J189 Pneumonia, unspecified organism: Secondary | ICD-10-CM

## 2019-05-18 DIAGNOSIS — U071 COVID-19: Principal | ICD-10-CM

## 2019-05-18 DIAGNOSIS — M7989 Other specified soft tissue disorders: Secondary | ICD-10-CM

## 2019-05-18 DIAGNOSIS — J1289 Other viral pneumonia: Secondary | ICD-10-CM

## 2019-05-18 LAB — CBC
HCT: 32.2 % — ABNORMAL LOW (ref 36.0–46.0)
Hemoglobin: 10.5 g/dL — ABNORMAL LOW (ref 12.0–15.0)
MCH: 32.1 pg (ref 26.0–34.0)
MCHC: 32.6 g/dL (ref 30.0–36.0)
MCV: 98.5 fL (ref 80.0–100.0)
Platelets: ADEQUATE 10*3/uL (ref 150–400)
RBC: 3.27 MIL/uL — ABNORMAL LOW (ref 3.87–5.11)
RDW: 15.3 % (ref 11.5–15.5)
WBC: 17 10*3/uL — ABNORMAL HIGH (ref 4.0–10.5)
nRBC: 0 % (ref 0.0–0.2)

## 2019-05-18 LAB — GLUCOSE, CAPILLARY
Glucose-Capillary: 235 mg/dL — ABNORMAL HIGH (ref 70–99)
Glucose-Capillary: 213 mg/dL — ABNORMAL HIGH (ref 70–99)
Glucose-Capillary: 331 mg/dL — ABNORMAL HIGH (ref 70–99)
Glucose-Capillary: 240 mg/dL — ABNORMAL HIGH (ref 70–99)
Glucose-Capillary: 155 mg/dL — ABNORMAL HIGH (ref 70–99)

## 2019-05-18 LAB — BASIC METABOLIC PANEL
Anion gap: 13 (ref 5–15)
BUN: 27 mg/dL — ABNORMAL HIGH (ref 8–23)
CO2: 33 mmol/L — ABNORMAL HIGH (ref 22–32)
Calcium: 9.5 mg/dL (ref 8.9–10.3)
Chloride: 84 mmol/L — ABNORMAL LOW (ref 98–111)
Creatinine, Ser: 0.97 mg/dL (ref 0.44–1.00)
GFR calc Af Amer: >60 mL/min (ref >60)
GFR calc non Af Amer: 56 mL/min — ABNORMAL LOW (ref >60)
Glucose, Bld: 187 mg/dL — ABNORMAL HIGH (ref 70–99)
Potassium: 4.7 mmol/L (ref 3.5–5.1)
Sodium: 130 mmol/L — ABNORMAL LOW (ref 135–145)

## 2019-05-18 MED ORDER — FUROSEMIDE 10 MG/ML IJ SOLN
80.0000 mg | Freq: Every day | INTRAMUSCULAR | Status: DC
  Administered 2019-05-19 – 2019-05-20 (×2): 80 mg via INTRAVENOUS
  Filled 2019-05-18 (×2): qty 8

## 2019-05-18 MED ORDER — IPRATROPIUM-ALBUTEROL 20-100 MCG/ACT IN AERS
4.0000 | INHALATION_SPRAY | Freq: Four times a day (QID) | RESPIRATORY_TRACT | Status: DC
  Administered 2019-05-18 – 2019-05-24 (×21): 4 via RESPIRATORY_TRACT
  Filled 2019-05-18: qty 4

## 2019-05-18 MED ORDER — MOMETASONE FURO-FORMOTEROL FUM 200-5 MCG/ACT IN AERO
2.0000 | INHALATION_SPRAY | Freq: Two times a day (BID) | RESPIRATORY_TRACT | Status: DC
  Administered 2019-05-18 – 2019-05-28 (×19): 2 via RESPIRATORY_TRACT
  Filled 2019-05-18: qty 8.8

## 2019-05-18 MED ORDER — INSULIN DETEMIR 100 UNIT/ML ~~LOC~~ SOLN
10.0000 [IU] | Freq: Every day | SUBCUTANEOUS | Status: DC
  Administered 2019-05-18 – 2019-05-27 (×10): 10 [IU] via SUBCUTANEOUS
  Filled 2019-05-18 (×11): qty 0.1

## 2019-05-18 NOTE — Plan of Care (Signed)
  Problem: Coping: Goal: Psychosocial and spiritual needs will be supported Outcome: Progressing   Problem: Respiratory: Goal: Will maintain a patent airway Outcome: Progressing Goal: Complications related to the disease process, condition or treatment will be avoided or minimized Outcome: Progressing   Problem: Clinical Measurements: Goal: Ability to maintain clinical measurements within normal limits will improve Outcome: Progressing Goal: Will remain free from infection Outcome: Progressing Goal: Respiratory complications will improve Outcome: Progressing Goal: Cardiovascular complication will be avoided Outcome: Progressing   Problem: Activity: Goal: Risk for activity intolerance will decrease Outcome: Progressing   Problem: Nutrition: Goal: Adequate nutrition will be maintained Outcome: Progressing   Problem: Coping: Goal: Level of anxiety will decrease Outcome: Progressing   Problem: Elimination: Goal: Will not experience complications related to urinary retention Outcome: Progressing   Problem: Pain Managment: Goal: General experience of comfort will improve Outcome: Progressing   Problem: Safety: Goal: Ability to remain free from injury will improve Outcome: Progressing

## 2019-05-18 NOTE — Progress Notes (Signed)
Pt's son Konrad Dolores updated on pt status and plan of care. All questions answered

## 2019-05-18 NOTE — Consult Note (Signed)
NAME:  [name], MRN:  [id number], DOB:  Mar 23, 1941, LOS: 23 ADMISSION DATE:  04/07/2019, CONSULTATION DATE:  12/14 REFERRING MD:  [place] Pink, CHIEF COMPLAINT: on-going hypoxic respiratory failure in sub-acute COVID-19 patient    Brief History   78 year old female patient with multiple comorbidities admitted from skilled nursing facility for COVID-19 pneumonia and associated ARDS.  Initially admitted on 11/20 with acute hypoxic respiratory failure pulmonary asked to see on 12/14 for ongoing hypoxia.   History of present illness   This is a 78 year old female patient Who resides at a skilled nursing facility and tested positive for COVID-19 infection on November 6.  She was hypoxic on day of initial presentation was treated aggressively with supplemental oxygen, Actemra, remdesivir, and complete course of dexamethasone.  Additionally also treated during this hospital stay for healthcare associated pneumonia.  She never required intubation however continues to require high FIO2 now just over 4 weeks from initial diagnosis.  Because she continues to require high FiO2 a CT of chest was obtained This continues to demonstrate diffuse bilateral groundglass lung opacities..  Her most current inflammatory markers show a normal D-dimer and normal CRP.  She has been receiving aggressive diuresis.  He was started back on IV steroids to see if this might make any difference in her oxygenation and additionally pulmonary was asked to evaluate  Past Medical History  Hypertension, diabetes type 2, macrocytic anemia, obesity, dyslipidemia, diastolic heart dysfunction grade 1   Significant Hospital Events   11/20 admitted 11/23 received Actemra 11/25 completed remdesivir 12/7: Started on IV diuresis 12/8: Echocardiogram obtained showed normal LV function RV size normal right atrial pressure estimated 3 mmHg Grade 1 diastolic dysfunction.  Cefepime completed 12/10: Diuretics escalated 12/13: CT of chest  showing diffuse groundglass changes Consults:  Pulmonary consulted  Procedures:    Significant Diagnostic Tests:  Echocardiogram 12/8: Normal LV function RV size normal RA size normal estimated 3 mmHg pressure.  Grade 1 diastolic dysfunction CT chest 11/13: Diffuse groundglass changes  Micro Data:  Respiratory culture 12/1 normal flora  Antimicrobials:   Cefepime 12/2 through 12/8 Interim history/subjective:  Still short of breath  Objective   Blood pressure 127/62, pulse (Abnormal) 105, temperature 97.7 F (36.5 C), temperature source Oral, resp. rate 18, height 5\' 5"  (1.651 m), weight 95.4 kg, SpO2 94 %.        Intake/Output Summary (Last 24 hours) at 05/18/2019 1442 Last data filed at 05/17/2019 2000 Gross per 24 hour  Intake no documentation  Output 200 ml  Net -200 ml   Filed Weights   05/15/19 0500 05/17/19 0343 05/18/19 0400  Weight: 99.4 kg 97.5 kg 95.4 kg    Examination: General: Remains on 15 L heated high flow not in acute distress.  HENT: NCAT no JVD  Lungs: decreased bases. No accessory use  Cardiovascular: RRR no MRG Abdomen: soft not tender  Extremities: + lower extremity edema pulses are palpable  Neuro: awake, oriented. No focal deficits  GU: cl yellow     Assessment & Plan:   Acute hypoxic respiratory failure COVID-19 pneumonia ARDS with persistent diffuse groundglass infiltrates Grade 1 diastolic dysfunction Obesity Anemia, macrocytic Physical deconditioning  Discussion This is a 78 year old white female s/p severe COVID pneumonia w/ ARDS. Her work of breathing has improved but unfortunately her cxr and oxygen requirements have not. Her prognosis for full recovery would be poor but that's not to say she can't still improve some with time. I agree w/ aggressive diuresis and  by exam it appears as though she still has more volume to give [name].   Recommendations Cont high flow oxygen; wean for sats > 88% Cont aggressive diuresis  Reasonable to trial another round of steroids.  It may still be weeks before her oxygen requirements improve. It seems reasonable to hold the current course, but agree should she have any set back such as: new infection, AKI or thromboembolic event would not escalate care beyond current treatments.    Labs   CBC: Recent Labs  Lab 05/12/19 0849 05/13/19 0126 05/14/19 0500 05/15/19 0220 05/16/19 0505 05/17/19 0958 05/18/19 0130  WBC 12.2* 12.0* 10.7* 11.9* 12.1* 13.0* 17.0*  NEUTROABS 8.6* 8.1*  --   --   --   --   --   HGB 11.5* 11.4* 11.1* 11.4* 11.0* 11.5* 10.5*  HCT 36.8 37.0 35.6* 35.9* 35.0* 35.6* 32.2*  MCV 100.0 100.8* 100.8* 100.6* 100.6* 97.5 98.5  PLT 189 167 140* 133* 121* 150 PLATELET CLUMPS NOTED ON SMEAR, COUNT APPEARS ADEQUATE    Basic Metabolic Panel: Recent Labs  Lab 05/14/19 0500 05/15/19 0220 05/16/19 0505 05/17/19 0958 05/18/19 0130  NA 134* 131* 132* 130* 130*  K 4.2 4.2 4.1 4.4 4.7  CL 90* 86* 84* 82* 84*  CO2 36* 35* 37* 35* 33*  GLUCOSE 136* 140* 131* 248* 187*  BUN 16 19 17 21  27*  CREATININE 0.52 0.53 0.62 0.68 0.97  CALCIUM 9.4 9.3 9.4 9.6 9.5   GFR: Estimated Creatinine Clearance: 55.5 mL/min (by C-G formula based on SCr of 0.97 mg/dL). Recent Labs  Lab 05/15/19 0220 05/16/19 0505 05/17/19 0958 05/18/19 0130  WBC 11.9* 12.1* 13.0* 17.0*    Liver Function Tests: No results for input(s): AST, ALT, ALKPHOS, BILITOT, PROT, ALBUMIN in the last 168 hours. No results for input(s): LIPASE, AMYLASE in the last 168 hours. No results for input(s): AMMONIA in the last 168 hours.  ABG    Component Value Date/Time   PHART 7.465 (H) 04/29/2019 0458   PCO2ART 42.3 04/29/2019 0458   PO2ART 52.0 (L) 04/29/2019 0458   HCO3 30.5 (H) 04/29/2019 0458   TCO2 32 04/29/2019 0458   O2SAT 89.0 04/29/2019 0458     Coagulation Profile: No results for input(s): INR, PROTIME in the last 168 hours.  Cardiac Enzymes: No results for input(s): CKTOTAL,  CKMB, CKMBINDEX, TROPONINI in the last 168 hours.  HbA1C: Hgb A1c MFr Bld  Date/Time Value Ref Range Status  05/14/2019 05:00 AM 8.4 (H) 4.8 - 5.6 % Final    Comment:    (NOTE) Pre diabetes:          5.7%-6.4% Diabetes:              >6.4% Glycemic control for   <7.0% adults with diabetes   04/09/2018 03:26 PM 6.3 4.6 - 6.5 % Final    Comment:    Glycemic Control Guidelines for People with Diabetes:Non Diabetic:  <6%Goal of Therapy: <7%Additional Action Suggested:  >8%     CBG: Recent Labs  Lab 05/17/19 1207 05/17/19 1623 05/17/19 2028 05/18/19 0744 05/18/19 1134  GLUCAP 244* 235* 246* 213* 331*    Review of Systems:   Review of Systems  Constitutional: Positive for malaise/fatigue. Negative for chills, fever and weight loss.  HENT: Negative.   Eyes: Negative.   Respiratory: Positive for cough, sputum production and shortness of breath. Negative for hemoptysis.   Cardiovascular: Positive for leg swelling. Negative for chest pain and palpitations.  Gastrointestinal: Negative.  Genitourinary: Negative.   Musculoskeletal: Negative.   Skin: Negative.   Neurological: Negative.   Endo/Heme/Allergies: Negative.   Psychiatric/Behavioral: Negative.      Past Medical History  She,  has a past medical history of Anxiety, Arthritis, Blood transfusion without reported diagnosis, Cataracts, bilateral, Cellulitis, Chicken pox, Chronic back pain, Depression, Elbow fracture, left (April 2015), GERD (gastroesophageal reflux disease), Glaucoma, Hyperlipemia, Hypertension, Insomnia, Migraine, Peripheral neuropathy, PPD positive, treated (1987), Radiculopathy, lumbar region, Restless leg syndrome, TIA (transient ischemic attack), and Vertigo.   Surgical History    Past Surgical History:  Procedure Laterality Date  . ABDOMINAL HYSTERECTOMY  1975   bil oophorectomy  . APPENDECTOMY    . BACK SURGERY   MANY YRS AGO   laminectomy x3  . CARPAL TUNNEL RELEASE  07/09/2012   Procedure:  CARPAL TUNNEL RELEASE;  Surgeon: Magnus Sinning, MD;  Location: [facility];  Service: Orthopedics;  Laterality: Left;  . cervical disc surgery x2  2011  . CHOLECYSTECTOMY  1993  . colonosocpy    . ESOPHAGOGASTRODUODENOSCOPY    . FINGER ARTHROPLASTY  07/09/2012   Procedure: FINGER ARTHROPLASTY;  Surgeon: Magnus Sinning, MD;  Location: [facility];  Service: Orthopedics;  Laterality: Left;  Interposition Arthroplasty CMC Joint Thumb Left   . HEMATOMA EVACUATION  2011   s/p rt cea  . JOINT REPLACEMENT  '01   total knee replacement, LEFT  . left knee arthroscopy  2001  . LIPOMA EXCISION  07/09/2012   Procedure: EXCISION LIPOMA;  Surgeon: Magnus Sinning, MD;  Location: [facility];  Service: Orthopedics;  Laterality: Left;  Excision Lipoma Dorsum Left Wrist   . LUMBAR LAMINECTOMY WITH COFLEX 1 LEVEL Bilateral 04/19/2015   Procedure: LUMBAR TWO-THREE LUMBAR LAMINECTOMY WITH COFLEX ;  Surgeon: Kristeen Miss, MD;  Location: [facility];  Service: Neurosurgery;  Laterality: Bilateral;  Bilateral L23 laminectomy and foraminotomy with coflex  . LUMBAR LAMINECTOMY/DECOMPRESSION MICRODISCECTOMY Right 12/19/2015   Procedure: Right Lumbar two-three  Microdiskectomy;  Surgeon: Kristeen Miss, MD;  Location: [facility];  Service: Neurosurgery;  Laterality: Right;  . ORIF ANKLE FRACTURE Right 09/14/2014   FIBULA   . ORIF ANKLE FRACTURE Right 09/14/2014   Procedure: OPEN REDUCTION INTERNAL FIXATION (ORIF) RIGHT ANKLE FRACTURE/SYNDESMOSIS ;  Surgeon: Wylene Simmer, MD;  Location: [facility];  Service: Orthopedics;  Laterality: Right;  . right knee replacement     09/2011   . rt carotid enarterectomy  2011  . rt knee arthroscopy  '99  . SHOULDER ARTHROSCOPY Right   . TOTAL KNEE ARTHROPLASTY  09/13/2011   Procedure: TOTAL KNEE ARTHROPLASTY;  Surgeon: Magnus Sinning, MD;  Location: [facility];  Service: Orthopedics;  Laterality: Right;     Social History   reports that she has never smoked. She has never used smokeless tobacco.  She reports that she does not drink alcohol or use drugs.   Family History   Her family history includes Alzheimer's disease in her mother; Arthritis in her brother, brother, father, mother, and sister; COPD in her sister; Cancer in her brother and brother; Congestive Heart Failure in her father and sister; Depression in her sister; Diabetes in her brother and brother; Early death in her maternal grandfather, maternal grandmother, paternal grandfather, paternal grandmother, and sister; Hearing loss in her father; Heart disease in her brother and father; Hyperlipidemia in her brother; Hypertension in her brother, brother, and sister; Prostate cancer in her brother; Testicular cancer in her brother.   Allergies Allergies  Allergen Reactions  .  Contrast Media [Iodinated Diagnostic Agents] Anaphylaxis    Pt denies allergy as of 08/13/2018.   [facility] Cross Reactors Nausea And Vomiting     Home Medications  Prior to Admission medications   Medication Sig Start Date End Date Taking? Authorizing Provider  amLODipine (NORVASC) 10 MG tablet Take 1 tablet (10 mg total) by mouth at bedtime. 04/11/18  Yes Nche, Charlene Brooke, NP  aspirin EC 325 MG tablet Take 1 tablet (325 mg total) by mouth daily. 07/20/16  Yes [name], [name] L, DO  atorvastatin (LIPITOR) 40 MG tablet Take 1 tablet (40 mg total) by mouth daily at 6 PM. 04/11/18  Yes Nche, Charlene Brooke, NP  cholecalciferol (VITAMIN D) 1000 units tablet Take 1,000 Units by mouth daily.   Yes [provider]  cyclobenzaprine (FLEXERIL) 10 MG tablet Take 10 mg by mouth 3 (three) times daily as needed for muscle spasms. 06/04/18  Yes [provider]  gabapentin (NEURONTIN) 600 MG tablet Take 1 tablet (600 mg total) by mouth 4 (four) times daily. Patient taking differently: Take 600 mg by mouth 4 (four) times daily. With meals and at bedtime 05/05/18  Yes Nche, Charlene Brooke, NP  losartan (COZAAR) 100 MG tablet Take 1 tablet (100 mg total) by  mouth daily. 04/11/18  Yes Nche, Charlene Brooke, NP  metoprolol succinate (TOPROL-XL) 100 MG 24 hr tablet TAKE 1 TABLET EVERY DAY, TAKE WITH 50 MG TABLET 04/11/18  Yes Nche, Charlene Brooke, NP  metoprolol succinate (TOPROL-XL) 50 MG 24 hr tablet TAKE 1 TABLET EVERY DAY ALONG WITH 100 MG TABLET 04/11/18  Yes Nche, Charlene Brooke, NP  potassium chloride SA (KLOR-CON) 20 MEQ tablet Take 20 mEq by mouth daily. 04/20/19  Yes [provider]  pyridOXINE (VITAMIN B-6) 50 MG tablet Take 50 mg by mouth daily.   Yes [provider]  sertraline (ZOLOFT) 50 MG tablet Take 1 tablet (50 mg total) by mouth daily. 04/11/18  Yes Nche, Charlene Brooke, NP  tiZANidine (ZANAFLEX) 4 MG tablet Take 4 mg by mouth 3 (three) times daily. 04/19/19  Yes [provider]  traZODone (DESYREL) 50 MG tablet Take 50 mg by mouth at bedtime. 04/21/19  Yes [provider]  acetaminophen (TYLENOL) 650 MG CR tablet Take 1,300 mg by mouth 2 (two) times daily as needed for pain.    [provider]  amoxicillin-clavulanate (AUGMENTIN) 875-125 MG tablet Take 1 tablet by mouth 2 (two) times daily. One po bid x 7 days 08/09/18   Charlesetta Shanks, MD  methocarbamol (ROBAXIN) 500 MG tablet Take 1 tablet (500 mg total) by mouth every 12 (twelve) hours as needed for muscle spasms. Patient not taking: Reported on 06/17/2018 12/23/17   Nche, Charlene Brooke, NP  oxyCODONE (ROXICODONE) 5 MG immediate release tablet Take 0.5-1 tablets (2.5-5 mg total) by mouth every 4 (four) hours as needed for severe pain. 08/13/18   Harris, Vernie Shanks, PA-C  pantoprazole (PROTONIX) 20 MG tablet Take 1 tablet (20 mg total) by mouth daily. Patient not taking: Reported on 06/17/2018 04/11/18   Nche, Charlene Brooke, NP  sodium chloride (OCEAN) 0.65 % SOLN nasal spray Place 1 spray into both nostrils as needed for congestion. 06/28/15   Lindell Spar I, NP  SUMAtriptan (IMITREX) 50 MG tablet TAKE 1 TABLET BY MOUTH AS NEEDED FOR MIGRAINES/HEADACHES. DO  NOT EXCEED 2 TABLETS IN 24 HOURS 06/10/18   Nche, Charlene Brooke, NP  valACYclovir (VALTREX) 1000 MG tablet Take 1 tablet (1,000 mg total) by mouth 3 (  three) times daily. 08/13/18   Harris, Vernie Shanks, PA-C  potassium chloride 20 MEQ TBCR Take 20 mEq by mouth daily. 08/07/14 08/07/14  Larene Pickett, PA-C     Erick Colace ACNP-BC [place] Pager # [phone] OR # [phone] if no answer

## 2019-05-18 NOTE — Progress Notes (Signed)
Bilateral lower extremity venous duplex has been completed. Preliminary results can be found in CV Proc through chart review.   05/18/19 1:53 PM [name] RVT

## 2019-05-18 NOTE — Progress Notes (Signed)
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 She does have significant past medical history for hypertension, dyslipidemia, TIA, GERD, anxiety, depression and arthritis. She tested positive for COVID-19 November 6. [name] a nursing home resident.Patient had a dry cough, she was quarantined at a skilled nursing facility. On the day of hospitalization she was noted to be hypoxic, requiring supplemental oxygen per nasal cannula. On her initial physical examination her oximetry was 76% on room air, blood pressure was 101/73, pulse rate 76, respirate 15, oxygen saturation 95% on 6 L of supplemental oxygen per nasal cannula.She had coarse breath sounds bilaterally, heart S1-S2 present rhythmic, abdomen soft, no lower extremity edema. Her chest radiograph had bilateral interstitial infiltrates, right upper lobe and left upper lobe.  Patient was admitted to the hospital working diagnosis of acute hypoxic respiratory failure due to SARS COVID-19 viral pneumonia.   Assessment & Plan:   Acute hypoxic respiratory failure due to SARS COVID 19 viral pneumonia/secondary bacterial infection Patient received Actemra 11/23. Completed remdesivir on 11/25. Patient also completed course of dexamethasone. She also recently completed a course of cefepime.  CRP is normal.  Despite treatments patient continues to have high oxygen requirements.  She remains on high flow nasal cannula at 13 to 15 L/min.  CT of the chest was done on 12/12 without contrast (due to contrast allergy) and it shows extensive groundglass opacities involving both lungs.  This is the most likely reason for her persistent hypoxia.  These findings were communicated to the patient and to her son.  Prognosis is guarded.  May consider getting opinion from pulmonology.  Patient was started  back on steroids to see if that would make any difference.  D-dimer was noted to be normal and so venous thromboembolism was thought to be less likely.  Lower extremity Doppler study is still pending though.    Continue with incentive spirometry and mobilization as much as possible.  Acute diastolic heart failure decompensation There was concern for fluid overload.  Patient was started on high-dose Lasix.  However there was not much improvement in her oxygenation.  Echocardiogram showed grade 1 diastolic dysfunction with normal systolic function.  Patient noted to be hyponatremic and hypochloremic.  We will cut back on dose of Lasix.  Continue ins and outs and daily weights.    Hyponatremia Sodium level is low but stable.  Possibly due to diuretics.  Continue to monitor.    Acute metabolic encephalopathy/history of depression Mentation seems to be back to baseline.  Continue with sertraline and trazodone.    Essential hypertension  Blood pressure remains stable.  Noted to be on metoprolol.  Also getting furosemide as discussed above.  Metoprolol dose was decreased.  Diabetes mellitus type 2 HbA1c 8.4.  Continue to monitor CBGs.  Due to low glucose levels her Levemir was discontinued.  However now she is back on steroids.  CBGs have climbed again.  Will reintroduce Levemir.   Dyslipidemia On atorvastatin.   Macrocytic anemia No overt bleeding noted.  Continue to monitor.  Hemoglobin is stable.  Obesity Estimated body mass index is 35 kg/m as calculated from the following:   Height as of this encounter: 5\' 5"  (1.651 m).   Weight as of this encounter: 95.4 kg.  Goals of care Patient CT scan findings and her clinical course discussed with patient as well  as her son.  Guarded prognosis was conveyed.  Patient understands the severity of her illness.  [name] a retired [employer] scientist.  However she still wants to continue all possible treatment options.  DVT prophylaxis:enoxaparin Code  Status: DNR Family Communication:Son being updated on a daily basis Disposition Plan:Prognosis is guarded.   Consultants:   None  Procedures:  Transthoracic echocardiogram 05/12/2019  1. Small circumferential pericardial effusion.  2. Left ventricular ejection fraction, by visual estimation, is 65 to 70%. The left ventricle has hyperdynamic function. There is mildly increased left ventricular hypertrophy.  3. Left ventricular diastolic parameters are consistent with Grade I diastolic dysfunction (impaired relaxation).  4. The left ventricle has no regional wall motion abnormalities.  5. Global right ventricle has normal systolic function.The right ventricular size is normal. No increase in right ventricular wall thickness.  6. The aortic valve is tricuspid. Aortic valve regurgitation is not visualized. Mild aortic valve sclerosis without stenosis.  7. Left atrial size was normal.  8. Right atrial size was normal.  9. The tricuspid valve is normal in structure. Tricuspid valve regurgitation is not demonstrated. 10. The mitral valve is normal in structure. No evidence of mitral valve regurgitation. No evidence of mitral stenosis. 11. The inferior vena cava is normal in size with greater than 50% respiratory variability, suggesting right atrial pressure of 3 mmHg. 12. TR signal is inadequate for assessing pulmonary artery systolic pressure.   Antimicrobials:  Anti-infectives (From admission, onward)   Start     Dose/Rate Route Frequency Ordered Stop   05/05/19 1500  ceFEPIme (MAXIPIME) 2 g in sodium chloride 0.9 % 100 mL IVPB     2 g 200 mL/hr over 30 Minutes Intravenous Every 8 hours 05/05/19 1451 05/12/19 2216   04/27/19 1400  ceFEPIme (MAXIPIME) 2 g in sodium chloride 0.9 % 100 mL IVPB  Status:  Discontinued     2 g 200 mL/hr over 30 Minutes Intravenous Every 8 hours 04/27/19 1014 04/28/19 0741   04/26/19 1000  remdesivir 100 mg in sodium chloride 0.9 % 250 mL IVPB     100  mg 500 mL/hr over 30 Minutes Intravenous Every 24 hours 04/25/19 0924 04/29/19 0758   04/26/19 0800  vancomycin (VANCOCIN) IVPB 1000 mg/200 mL premix  Status:  Discontinued     1,000 mg 200 mL/hr over 60 Minutes Intravenous Every 24 hours 04/25/19 0659 04/28/19 0741   04/25/19 1800  ceFEPIme (MAXIPIME) 2 g in sodium chloride 0.9 % 100 mL IVPB  Status:  Discontinued     2 g 200 mL/hr over 30 Minutes Intravenous Every 12 hours 04/25/19 0658 04/27/19 1014   04/25/19 1200  remdesivir 200 mg in sodium chloride 0.9 % 250 mL IVPB     200 mg 500 mL/hr over 30 Minutes Intravenous Once 04/25/19 0924 04/25/19 1220   04/25/19 0615  vancomycin (VANCOCIN) 2,250 mg in sodium chloride 0.9 % 500 mL IVPB     2,250 mg 250 mL/hr over 120 Minutes Intravenous STAT 04/25/19 0613 04/25/19 0956   04/25/19 0615  ceFEPIme (MAXIPIME) 2 g in sodium chloride 0.9 % 100 mL IVPB     2 g 200 mL/hr over 30 Minutes Intravenous STAT 04/25/19 0613 04/25/19 0704        Subjective: Patient continues to feel poorly.  She however does not want to give up.  She wants to continue to try a therapeutic options.  Objective: Vitals:   05/18/19 0926 05/18/19 0929 05/18/19 1123 05/18/19 1135  BP:  127/62  Pulse: (!) 109 (!) 108 (!) 103   Resp:    18  Temp:    97.7 F (36.5 C)  TempSrc:    Oral  SpO2: (!) 86% 94% 96% 91%  Weight:      Height:        Intake/Output Summary (Last 24 hours) at 05/18/2019 1309 Last data filed at 05/17/2019 2000 Gross per 24 hour  Intake --  Output 200 ml  Net -200 ml   Filed Weights   05/15/19 0500 05/17/19 0343 05/18/19 0400  Weight: 99.4 kg 97.5 kg 95.4 kg    Examination:   General appearance: Awake alert.  In no distress.  Fatigued Resp: Noted to be mildly tachypneic.  Coarse breath sounds.  Crackles bilaterally.  No wheezing or rhonchi. Cardio: S1-S2 is normal regular.  No S3-S4.  No rubs murmurs or bruit GI: Abdomen is soft.  Nontender nondistended.  Bowel sounds are present  normal.  No masses organomegaly Extremities: Bilateral lower extremity edema noted.  Unchanged. Neurologic: Alert and oriented x3.  No focal neurological deficits.      Data Reviewed: I have personally reviewed following labs and imaging studies  CBC: Recent Labs  Lab 05/12/19 0849 05/13/19 0126 05/14/19 0500 05/15/19 0220 05/16/19 0505 05/17/19 0958 05/18/19 0130  WBC 12.2* 12.0* 10.7* 11.9* 12.1* 13.0* 17.0*  NEUTROABS 8.6* 8.1*  --   --   --   --   --   HGB 11.5* 11.4* 11.1* 11.4* 11.0* 11.5* 10.5*  HCT 36.8 37.0 35.6* 35.9* 35.0* 35.6* 32.2*  MCV 100.0 100.8* 100.8* 100.6* 100.6* 97.5 98.5  PLT 189 167 140* 133* 121* 150 PLATELET CLUMPS NOTED ON SMEAR, COUNT APPEARS ADEQUATE   Basic Metabolic Panel: Recent Labs  Lab 05/14/19 0500 05/15/19 0220 05/16/19 0505 05/17/19 0958 05/18/19 0130  NA 134* 131* 132* 130* 130*  K 4.2 4.2 4.1 4.4 4.7  CL 90* 86* 84* 82* 84*  CO2 36* 35* 37* 35* 33*  GLUCOSE 136* 140* 131* 248* 187*  BUN 16 19 17 21  27*  CREATININE 0.52 0.53 0.62 0.68 0.97  CALCIUM 9.4 9.3 9.4 9.6 9.5   GFR: Estimated Creatinine Clearance: 55.5 mL/min (by C-G formula based on SCr of 0.97 mg/dL).  CBG: Recent Labs  Lab 05/17/19 1207 05/17/19 1623 05/17/19 2028 05/18/19 0744 05/18/19 1134  GLUCAP 244* 235* 246* 213* 331*      Radiology Studies: CT CHEST WO CONTRAST  Result Date: 05/16/2019 CLINICAL DATA:  Shortness of breath.  COVID-19 pneumonia. EXAM: CT CHEST WITHOUT CONTRAST TECHNIQUE: Multidetector CT imaging of the chest was performed following the standard protocol without IV contrast. COMPARISON:  Chest radiograph, 05/14/2019 and older exams. Chest CT, 09/17/2014. FINDINGS: Cardiovascular: Heart is normal in size. No pericardial effusion. Mild left coronary artery calcifications. Mild aortic atherosclerotic calcifications. Mild prominence of the main pulmonary artery measuring 3.4 cm. Mediastinum/Nodes: No neck base or axillary masses or  enlarged lymph nodes. Few prominent mediastinal lymph nodes, largest right paratracheal node just above the carinal measuring 1.2 cm in short axis. This is similar to the prior chest CT. No mediastinal or hilar masses. Trachea and esophagus are unremarkable. Lungs/Pleura: There are extensive bilateral ground-glass airspace opacities. No lung mass nodule. No findings to suggest pulmonary edema. No pleural effusion or pneumothorax. Upper Abdomen: No acute abnormality. Musculoskeletal: No fracture or acute finding. No osteoblastic or osteolytic lesions. IMPRESSION: 1. Bilateral ground-glass lung opacities consistent with multifocal COVID-19 pneumonia. 2. No other acute abnormality. 3. Prominent main  pulmonary artery. Possible degree of pulmonary hypertension. 4. Mild left coronary artery calcifications. Mild aortic atherosclerotic calcifications. Aortic Atherosclerosis (ICD10-I70.0). Electronically Signed   By: Lajean Manes M.D.   On: 05/16/2019 15:02        Scheduled Meds: . aspirin EC  325 mg Oral Daily  . atorvastatin  40 mg Oral q1800  . Chlorhexidine Gluconate Cloth  6 each Topical Daily  . chlorpheniramine-HYDROcodone  5 mL Oral Q12H  . cholecalciferol  1,000 Units Oral Daily  . docusate sodium  100 mg Oral BID  . enoxaparin (LOVENOX) injection  0.5 mg/kg Subcutaneous Q24H  . furosemide  80 mg Intravenous BID  . gabapentin  600 mg Oral TID WC & HS  . insulin aspart  0-20 Units Subcutaneous TID WC  . insulin aspart  0-5 Units Subcutaneous QHS  . Ipratropium-Albuterol  1 puff Inhalation Q6H  . magic mouthwash w/lidocaine  10 mL Oral TID  . mouth rinse  15 mL Mouth Rinse BID  . methylPREDNISolone (SOLU-MEDROL) injection  60 mg Intravenous Q12H  . metoprolol succinate  25 mg Oral Daily  . multivitamin with minerals  1 tablet Oral Daily  . nystatin  5 mL Mouth/Throat QID  . pantoprazole  40 mg Oral Daily  . pneumococcal 23 valent vaccine  0.5 mL Intramuscular Tomorrow-1000  . polyethylene  glycol  17 g Oral Daily  . pyridOXINE  50 mg Oral Daily  . sertraline  50 mg Oral Daily  . traZODone  50 mg Oral QHS  . vitamin C  500 mg Oral Daily   Continuous Infusions:    LOS: 23 days     Bonnielee Haff, MD Pager: [email or web address]

## 2019-05-19 LAB — GLUCOSE, CAPILLARY
Glucose-Capillary: 223 mg/dL — ABNORMAL HIGH (ref 70–99)
Glucose-Capillary: 257 mg/dL — ABNORMAL HIGH (ref 70–99)
Glucose-Capillary: 151 mg/dL — ABNORMAL HIGH (ref 70–99)
Glucose-Capillary: 189 mg/dL — ABNORMAL HIGH (ref 70–99)

## 2019-05-19 MED ORDER — SENNOSIDES-DOCUSATE SODIUM 8.6-50 MG PO TABS
2.0000 | ORAL_TABLET | Freq: Two times a day (BID) | ORAL | Status: DC
  Administered 2019-05-19 – 2019-05-26 (×15): 2 via ORAL
  Filled 2019-05-19 (×17): qty 2

## 2019-05-19 MED ORDER — FLEET ENEMA 7-19 GM/118ML RE ENEM
1.0000 | ENEMA | Freq: Every day | RECTAL | Status: DC | PRN
  Administered 2019-05-19 – 2019-05-22 (×2): 1 via RECTAL
  Filled 2019-05-19 (×2): qty 1

## 2019-05-19 MED ORDER — POLYETHYLENE GLYCOL 3350 17 G PO PACK
17.0000 g | PACK | Freq: Three times a day (TID) | ORAL | Status: DC
  Administered 2019-05-19 – 2019-05-26 (×12): 17 g via ORAL
  Filled 2019-05-19 (×16): qty 1

## 2019-05-19 NOTE — Progress Notes (Signed)
Physical Therapy Treatment Patient Details Name: [name] MRN: [id number] DOB: Mar 06, 1941 Today's Date: 05/19/2019    History of Present Illness 78 y.o. female past medical history significant for hypertension TIA anxiety depression presents to the hospital from [facility] via EMS for hypoxia she tested positive for COVID-19 on 04/10/2019. pt admitted with acute respiratory failure with hypoxia due to COVID 19 PNA.  Chest xray 12/1 shows small pleural effusion.    PT Comments    Pt still needing max encouragement to complete tasks but did well in tx today. Pt c/o pain in back, attempted to complete supine stretching but pt unable to assist wih positioning c/o pain with movement. Worked on log rolling for supine<>sit, pt able to complete with max a and set up, able to sit EOB approx 12 mins with B feet on foot and uni hand on rail. Again pt needing max encouragement to maintain sitting. At therapist arrival pt was on 10L/min via HFNC and was satting in high 80s-90s at rest. With supine to sit pt desat to low 70s and had great difficulty with recovery, after  mins and sats in high 70s to low 80s increased 02 to 15l/min via HFNC again results fluctuated at times able to get to mid 80s but returned to 70/80s level. Therapist changed probe from ear lobe to finger and back with similar readings. Placed pt on 15L NRB and sats increased to 99% completed rest of session on 15L via HFNC and 15 NRB. Returned to supine with max a and cues for log roll once in once in bed needed mod a to scoot up in bed but bed placed in trended position, rolled L/R with mod a to readjust bed linens. At end on session pt back on 15L/min via HFNc and sats 89-90%      Follow Up Recommendations  SNF     Equipment Recommendations  None recommended by PT    Recommendations for Other Services       Precautions / Restrictions Precautions Precautions: Other (comment)(02 desaturation with activity) Restrictions Weight  Bearing Restrictions: No    Mobility  Bed Mobility Overal bed mobility: Needs Assistance Bed Mobility: Rolling;Supine to Sit;Sit to Supine Rolling: Mod assist   Supine to sit: Max assist;Mod assist Sit to supine: Mod assist   General bed mobility comments: worked on log rolling to decrease pain in back with mobility, needs reinforcement  Transfers                 General transfer comment: did not attempt transfer today was able to get to EOB then c/o pain in back, desaturation, feeling nauseated  Ambulation/Gait                 Stairs             Wheelchair Mobility    Modified Rankin (Stroke Patients Only)       Balance Overall balance assessment: Needs assistance Sitting-balance support: Feet supported;Single extremity supported Sitting balance-Leahy Scale: Fair Sitting balance - Comments: able to sit EOB approx 40mins with uni hand support and feet on floor, no LOBs noted     Standing balance-Leahy Scale: Poor                              Cognition Arousal/Alertness: Awake/alert Behavior During Therapy: WFL for tasks assessed/performed Overall Cognitive Status: No family/caregiver present to determine baseline cognitive functioning  General Comments: some outburts with attempted education/encouragement but did well with most mobility this am      Exercises Other Exercises Other Exercises: pt c/o pain in back, attempted stretching in supine, unsucessful sec to poor participation from pt    General Comments        Pertinent Vitals/Pain Pain Assessment: Faces(c/o pain in back at rest and also in sitting) Faces Pain Scale: Hurts even more Pain Location: back Pain Descriptors / Indicators: Guarding;Grimacing;Discomfort Pain Intervention(s): Limited activity within patient's tolerance;Monitored during session;Repositioned    Home Living                      Prior  Function            PT Goals (current goals can now be found in the care plan section) Acute Rehab PT Goals Patient Stated Goal: no goals stated, states she knows she is going to die, attempted to encourage and motivate but states only wants assistance and not lecture Time For Goal Achievement: 06/01/19 Potential to Achieve Goals: Fair Progress towards PT goals: Not progressing toward goals - comment    Frequency    Min 2X/week      PT Plan Current plan remains appropriate    Co-evaluation              AM-PAC PT "6 Clicks" Mobility   Outcome Measure  Help needed turning from your back to your side while in a flat bed without using bedrails?: A Little Help needed moving from lying on your back to sitting on the side of a flat bed without using bedrails?: A Little Help needed moving to and from a bed to a chair (including a wheelchair)?: A Lot Help needed standing up from a chair using your arms (e.g., wheelchair or bedside chair)?: A Lot Help needed to walk in hospital room?: Total Help needed climbing 3-5 steps with a railing? : Total 6 Click Score: 12    End of Session Equipment Utilized During Treatment: Oxygen Activity Tolerance: Patient limited by fatigue;Patient limited by lethargy;Patient limited by pain;Treatment limited secondary to medical complications (Comment) Patient left: in bed;with call bell/phone within reach Nurse Communication: Mobility status PT Visit Diagnosis: Unsteadiness on feet (R26.81);Muscle weakness (generalized) (M62.81)     Time: [id number] PT Time Calculation (min) (ACUTE ONLY): 38 min  Charges:  $Therapeutic Activity: 23-37 mins $Self Care/Home Management: [place], PT    Delford Field 05/19/2019, 12:47 PM

## 2019-05-19 NOTE — Plan of Care (Signed)
  Problem: Coping: Goal: Psychosocial and spiritual needs will be supported Outcome: Progressing   Problem: Respiratory: Goal: Will maintain a patent airway Outcome: Progressing Goal: Complications related to the disease process, condition or treatment will be avoided or minimized Outcome: Progressing   Problem: Clinical Measurements: Goal: Ability to maintain clinical measurements within normal limits will improve Outcome: Progressing Goal: Will remain free from infection Outcome: Progressing Goal: Respiratory complications will improve Outcome: Progressing Goal: Cardiovascular complication will be avoided Outcome: Progressing   Problem: Activity: Goal: Risk for activity intolerance will decrease Outcome: Progressing   Problem: Nutrition: Goal: Adequate nutrition will be maintained Outcome: Progressing   Problem: Coping: Goal: Level of anxiety will decrease Outcome: Progressing   Problem: Elimination: Goal: Will not experience complications related to urinary retention Outcome: Progressing   Problem: Pain Managment: Goal: General experience of comfort will improve Outcome: Progressing   Problem: Safety: Goal: Ability to remain free from injury will improve Outcome: Progressing

## 2019-05-19 NOTE — Progress Notes (Addendum)
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 She does have significant past medical history for hypertension, dyslipidemia, TIA, GERD, anxiety, depression and arthritis. She tested positive for COVID-19 November 6. [name] a nursing home resident.Patient had a dry cough, she was quarantined at a skilled nursing facility. On the day of hospitalization she was noted to be hypoxic, requiring supplemental oxygen per nasal cannula. On her initial physical examination her oximetry was 76% on room air, blood pressure was 101/73, pulse rate 76, respirate 15, oxygen saturation 95% on 6 L of supplemental oxygen per nasal cannula.She had coarse breath sounds bilaterally, heart S1-S2 present rhythmic, abdomen soft, no lower extremity edema. Her chest radiograph had bilateral interstitial infiltrates, right upper lobe and left upper lobe.  Patient was admitted to the hospital working diagnosis of acute hypoxic respiratory failure due to SARS COVID-19 viral pneumonia.   Assessment & Plan:   Acute hypoxic respiratory failure due to SARS COVID 19 viral pneumonia/secondary bacterial infection Patient received Actemra 11/23. Completed remdesivir on 11/25. Patient also completed course of dexamethasone. She also recently completed a course of cefepime.  CRP is normal.  Despite treatments patient continues to have high oxygen requirements.  CT of the chest was done on 12/12 without contrast (due to contrast allergy) and it shows extensive groundglass opacities involving both lungs.  This is the most likely reason for her persistent hypoxia.  These findings were communicated to the patient and to her son.    Pulmonology was consulted.  They have recommended continuing with steroids diuresis and another swallow evaluation to rule out aspiration.  Patient  remains on 12 to 15 L of oxygen by high flow nasal cannula.  Patient was started back on steroids.  D-dimer was noted to be normal.  CRP is normal.  Lower extremity Doppler studies without any DVT.  Continue with incentive spirometry and mobilization as much as possible.  Acute diastolic heart failure decompensation There was concern for fluid overload.  Patient was started on high-dose Lasix.  However there was not much improvement in her oxygenation.  Echocardiogram showed grade 1 diastolic dysfunction with normal systolic function.  Patient noted to be hyponatremic and hypochloremic.  Lasix dose was decreased.  Recheck labs tomorrow.  Hyponatremia Sodium level is low but stable.  Possibly due to diuretics.  Recheck labs tomorrow.  Acute metabolic encephalopathy/history of depression Mentation seems to be back to baseline.  Continue with sertraline and trazodone.    Essential hypertension  Blood pressure remains stable.  Noted to be on metoprolol.  Also getting furosemide as discussed above.  Metoprolol dose was decreased.  Diabetes mellitus type 2 HbA1c 8.4.  Due to low glucose levels her Levemir was discontinued.  However now she is back on steroids.  CBGs have climbed again.  Started back on Levemir.  Monitor CBGs.  Dyslipidemia On atorvastatin.   Macrocytic anemia No overt bleeding noted.  Continue to monitor.  Hemoglobin is stable.  Constipation Increase dose of laxative. She had to be disimpacted 2 days ago.  Obesity Estimated body mass index is 35 kg/m as calculated from the following:   Height as of this encounter: 5\' 5"  (1.651 m).   Weight as of this encounter: 95.4 kg.  Goals of care Patient CT scan findings and her clinical course discussed with patient as well as her son.  Guarded prognosis was conveyed.  Patient understands the severity of her illness.  [name] a retired [employer] scientist.  However she still wants to continue all possible treatment options.  DVT  prophylaxis:enoxaparin Code Status: DNR Family Communication:Son being updated on a daily basis Disposition Plan:Prognosis is guarded.  Pulmonology has seen the patient.   Consultants:   Pulmonology  Procedures:  Transthoracic echocardiogram 05/12/2019  1. Small circumferential pericardial effusion.  2. Left ventricular ejection fraction, by visual estimation, is 65 to 70%. The left ventricle has hyperdynamic function. There is mildly increased left ventricular hypertrophy.  3. Left ventricular diastolic parameters are consistent with Grade I diastolic dysfunction (impaired relaxation).  4. The left ventricle has no regional wall motion abnormalities.  5. Global right ventricle has normal systolic function.The right ventricular size is normal. No increase in right ventricular wall thickness.  6. The aortic valve is tricuspid. Aortic valve regurgitation is not visualized. Mild aortic valve sclerosis without stenosis.  7. Left atrial size was normal.  8. Right atrial size was normal.  9. The tricuspid valve is normal in structure. Tricuspid valve regurgitation is not demonstrated. 10. The mitral valve is normal in structure. No evidence of mitral valve regurgitation. No evidence of mitral stenosis. 11. The inferior vena cava is normal in size with greater than 50% respiratory variability, suggesting right atrial pressure of 3 mmHg. 12. TR signal is inadequate for assessing pulmonary artery systolic pressure.   Antimicrobials:  Anti-infectives (From admission, onward)   Start     Dose/Rate Route Frequency Ordered Stop   05/05/19 1500  ceFEPIme (MAXIPIME) 2 g in sodium chloride 0.9 % 100 mL IVPB     2 g 200 mL/hr over 30 Minutes Intravenous Every 8 hours 05/05/19 1451 05/12/19 2216   04/27/19 1400  ceFEPIme (MAXIPIME) 2 g in sodium chloride 0.9 % 100 mL IVPB  Status:  Discontinued     2 g 200 mL/hr over 30 Minutes Intravenous Every 8 hours 04/27/19 1014 04/28/19 0741   04/26/19  1000  remdesivir 100 mg in sodium chloride 0.9 % 250 mL IVPB     100 mg 500 mL/hr over 30 Minutes Intravenous Every 24 hours 04/25/19 0924 04/29/19 0758   04/26/19 0800  vancomycin (VANCOCIN) IVPB 1000 mg/200 mL premix  Status:  Discontinued     1,000 mg 200 mL/hr over 60 Minutes Intravenous Every 24 hours 04/25/19 0659 04/28/19 0741   04/25/19 1800  ceFEPIme (MAXIPIME) 2 g in sodium chloride 0.9 % 100 mL IVPB  Status:  Discontinued     2 g 200 mL/hr over 30 Minutes Intravenous Every 12 hours 04/25/19 0658 04/27/19 1014   04/25/19 1200  remdesivir 200 mg in sodium chloride 0.9 % 250 mL IVPB     200 mg 500 mL/hr over 30 Minutes Intravenous Once 04/25/19 0924 04/25/19 1220   04/25/19 0615  vancomycin (VANCOCIN) 2,250 mg in sodium chloride 0.9 % 500 mL IVPB     2,250 mg 250 mL/hr over 120 Minutes Intravenous STAT 04/25/19 0613 04/25/19 0956   04/25/19 0615  ceFEPIme (MAXIPIME) 2 g in sodium chloride 0.9 % 100 mL IVPB     2 g 200 mL/hr over 30 Minutes Intravenous STAT 04/25/19 0613 04/25/19 0704        Subjective: Patient mentions that she is feeling slightly better today.  Still short of breath with minimal exertion.  Denies any chest pain.  No nausea or vomiting.  Objective: Vitals:   05/18/19 2000 05/19/19 0350 05/19/19 0717 05/19/19  0917  BP: 115/64 (!) 124/59 (!) 142/79   Pulse: (!) 101 99 (!) 104   Resp: 18 20 18    Temp: 97.6 F (36.4 C) 97.8 F (36.6 C) (!) 97.3 F (36.3 C)   TempSrc: Oral Oral Oral   SpO2: 97% 98% 90% (!) 89%  Weight:      Height:        Intake/Output Summary (Last 24 hours) at 05/19/2019 1301 Last data filed at 05/19/2019 1000 Gross per 24 hour  Intake 50 ml  Output 400 ml  Net -350 ml   Filed Weights   05/15/19 0500 05/17/19 0343 05/18/19 0400  Weight: 99.4 kg 97.5 kg 95.4 kg    Examination:   General appearance: Awake alert.  In no distress.  Fatigue. Resp: Mildly tachypneic at rest.  Coarse breath sounds with crackles at the bases.  No  wheezing or rhonchi. Cardio: S1-S2 is normal regular.  No S3-S4.  No rubs murmurs or bruit GI: Abdomen is soft.  Nontender nondistended.  Bowel sounds are present normal.  No masses organomegaly Extremities: Bilateral lower extremity edema.  Able to move her extremities Neurologic: Alert and oriented x3.  No focal neurological deficits.       Data Reviewed: I have personally reviewed following labs and imaging studies  CBC: Recent Labs  Lab 05/13/19 0126 05/14/19 0500 05/15/19 0220 05/16/19 0505 05/17/19 0958 05/18/19 0130  WBC 12.0* 10.7* 11.9* 12.1* 13.0* 17.0*  NEUTROABS 8.1*  --   --   --   --   --   HGB 11.4* 11.1* 11.4* 11.0* 11.5* 10.5*  HCT 37.0 35.6* 35.9* 35.0* 35.6* 32.2*  MCV 100.8* 100.8* 100.6* 100.6* 97.5 98.5  PLT 167 140* 133* 121* 150 PLATELET CLUMPS NOTED ON SMEAR, COUNT APPEARS ADEQUATE   Basic Metabolic Panel: Recent Labs  Lab 05/14/19 0500 05/15/19 0220 05/16/19 0505 05/17/19 0958 05/18/19 0130  NA 134* 131* 132* 130* 130*  K 4.2 4.2 4.1 4.4 4.7  CL 90* 86* 84* 82* 84*  CO2 36* 35* 37* 35* 33*  GLUCOSE 136* 140* 131* 248* 187*  BUN 16 19 17 21  27*  CREATININE 0.52 0.53 0.62 0.68 0.97  CALCIUM 9.4 9.3 9.4 9.6 9.5   GFR: Estimated Creatinine Clearance: 55.5 mL/min (by C-G formula based on SCr of 0.97 mg/dL).  CBG: Recent Labs  Lab 05/18/19 1134 05/18/19 1507 05/18/19 2052 05/19/19 0732 05/19/19 1200  GLUCAP 331* 240* 155* 223* 257*      Radiology Studies: CT CHEST WO CONTRAST  Result Date: 05/16/2019 CLINICAL DATA:  Shortness of breath.  COVID-19 pneumonia. EXAM: CT CHEST WITHOUT CONTRAST TECHNIQUE: Multidetector CT imaging of the chest was performed following the standard protocol without IV contrast. COMPARISON:  Chest radiograph, 05/14/2019 and older exams. Chest CT, 09/17/2014. FINDINGS: Cardiovascular: Heart is normal in size. No pericardial effusion. Mild left coronary artery calcifications. Mild aortic atherosclerotic  calcifications. Mild prominence of the main pulmonary artery measuring 3.4 cm. Mediastinum/Nodes: No neck base or axillary masses or enlarged lymph nodes. Few prominent mediastinal lymph nodes, largest right paratracheal node just above the carinal measuring 1.2 cm in short axis. This is similar to the prior chest CT. No mediastinal or hilar masses. Trachea and esophagus are unremarkable. Lungs/Pleura: There are extensive bilateral ground-glass airspace opacities. No lung mass nodule. No findings to suggest pulmonary edema. No pleural effusion or pneumothorax. Upper Abdomen: No acute abnormality. Musculoskeletal: No fracture or acute finding. No osteoblastic or osteolytic lesions. IMPRESSION: 1. Bilateral ground-glass lung opacities consistent  with multifocal COVID-19 pneumonia. 2. No other acute abnormality. 3. Prominent main pulmonary artery. Possible degree of pulmonary hypertension. 4. Mild left coronary artery calcifications. Mild aortic atherosclerotic calcifications. Aortic Atherosclerosis (ICD10-I70.0). Electronically Signed   By: Lajean Manes M.D.   On: 05/16/2019 15:02   LE Venous  Result Date: 05/19/2019  Lower Venous Study Indications: Swelling.  Risk Factors: COVID 19 positive. Limitations: Body habitus, poor ultrasound/tissue interface and patient positioning. Comparison Study: No prior studies. Performing Technologist: Oliver Hum RVT  Examination Guidelines: A complete evaluation includes B-mode imaging, spectral Doppler, color Doppler, and power Doppler as needed of all accessible portions of each vessel. Bilateral testing is considered an integral part of a complete examination. Limited examinations for reoccurring indications may be performed as noted.  +---------+---------------+---------+-----------+----------+--------------+ RIGHT    CompressibilityPhasicitySpontaneityPropertiesThrombus Aging +---------+---------------+---------+-----------+----------+--------------+ CFV       Full           Yes      Yes                                 +---------+---------------+---------+-----------+----------+--------------+ SFJ      Full                                                        +---------+---------------+---------+-----------+----------+--------------+ FV Prox  Full                                                        +---------+---------------+---------+-----------+----------+--------------+ FV Mid   Full                                                        +---------+---------------+---------+-----------+----------+--------------+ FV Distal               Yes      Yes                                 +---------+---------------+---------+-----------+----------+--------------+ PFV      Full                                                        +---------+---------------+---------+-----------+----------+--------------+ POP      Full           Yes      Yes                                 +---------+---------------+---------+-----------+----------+--------------+ PTV      Full                                                        +---------+---------------+---------+-----------+----------+--------------+  PERO     Full                                                        +---------+---------------+---------+-----------+----------+--------------+   +---------+---------------+---------+-----------+----------+--------------+ LEFT     CompressibilityPhasicitySpontaneityPropertiesThrombus Aging +---------+---------------+---------+-----------+----------+--------------+ CFV      Full           Yes      Yes                                 +---------+---------------+---------+-----------+----------+--------------+ SFJ      Full                                                        +---------+---------------+---------+-----------+----------+--------------+ FV Prox  Full                                                         +---------+---------------+---------+-----------+----------+--------------+ FV Mid   Full                                                        +---------+---------------+---------+-----------+----------+--------------+ FV DistalFull                                                        +---------+---------------+---------+-----------+----------+--------------+ PFV      Full                                                        +---------+---------------+---------+-----------+----------+--------------+ POP      Full           Yes      Yes                                 +---------+---------------+---------+-----------+----------+--------------+ PTV      Full                                                        +---------+---------------+---------+-----------+----------+--------------+ PERO     Full                                                        +---------+---------------+---------+-----------+----------+--------------+  Summary: Right: There is no evidence of deep vein thrombosis in the lower extremity. However, portions of this examination were limited- see technologist comments above. No cystic structure found in the popliteal fossa. Left: There is no evidence of deep vein thrombosis in the lower extremity. However, portions of this examination were limited- see technologist comments above. No cystic structure found in the popliteal fossa.  *See table(s) above for measurements and observations. Electronically signed by Harold Barban MD on 05/19/2019 at 8:29:11 AM.    Final         Scheduled Meds: . aspirin EC  325 mg Oral Daily  . atorvastatin  40 mg Oral q1800  . Chlorhexidine Gluconate Cloth  6 each Topical Daily  . chlorpheniramine-HYDROcodone  5 mL Oral Q12H  . cholecalciferol  1,000 Units Oral Daily  . enoxaparin (LOVENOX) injection  0.5 mg/kg Subcutaneous Q24H  . furosemide  80 mg Intravenous Daily  . gabapentin  600  mg Oral TID WC & HS  . insulin aspart  0-20 Units Subcutaneous TID WC  . insulin aspart  0-5 Units Subcutaneous QHS  . insulin detemir  10 Units Subcutaneous Daily  . Ipratropium-Albuterol  4 puff Inhalation Q6H  . magic mouthwash w/lidocaine  10 mL Oral TID  . mouth rinse  15 mL Mouth Rinse BID  . methylPREDNISolone (SOLU-MEDROL) injection  60 mg Intravenous Q12H  . metoprolol succinate  25 mg Oral Daily  . mometasone-formoterol  2 puff Inhalation BID  . multivitamin with minerals  1 tablet Oral Daily  . nystatin  5 mL Mouth/Throat QID  . pantoprazole  40 mg Oral Daily  . pneumococcal 23 valent vaccine  0.5 mL Intramuscular Tomorrow-1000  . polyethylene glycol  17 g Oral TID  . pyridOXINE  50 mg Oral Daily  . senna-docusate  2 tablet Oral BID  . sertraline  50 mg Oral Daily  . traZODone  50 mg Oral QHS  . vitamin C  500 mg Oral Daily   Continuous Infusions:    LOS: 24 days     Bonnielee Haff, MD Pager: [email or web address]

## 2019-05-20 DIAGNOSIS — F329 Major depressive disorder, single episode, unspecified: Secondary | ICD-10-CM

## 2019-05-20 LAB — GLUCOSE, CAPILLARY
Glucose-Capillary: 130 mg/dL — ABNORMAL HIGH (ref 70–99)
Glucose-Capillary: 196 mg/dL — ABNORMAL HIGH (ref 70–99)
Glucose-Capillary: 189 mg/dL — ABNORMAL HIGH (ref 70–99)
Glucose-Capillary: 230 mg/dL — ABNORMAL HIGH (ref 70–99)

## 2019-05-20 LAB — BASIC METABOLIC PANEL
Anion gap: 8 (ref 5–15)
BUN: 30 mg/dL — ABNORMAL HIGH (ref 8–23)
CO2: 39 mmol/L — ABNORMAL HIGH (ref 22–32)
Calcium: 9.7 mg/dL (ref 8.9–10.3)
Chloride: 84 mmol/L — ABNORMAL LOW (ref 98–111)
Creatinine, Ser: 0.58 mg/dL (ref 0.44–1.00)
GFR calc Af Amer: >60 mL/min (ref >60)
GFR calc non Af Amer: >60 mL/min (ref >60)
Glucose, Bld: 143 mg/dL — ABNORMAL HIGH (ref 70–99)
Potassium: 4.7 mmol/L (ref 3.5–5.1)
Sodium: 131 mmol/L — ABNORMAL LOW (ref 135–145)

## 2019-05-20 MED ORDER — FUROSEMIDE 10 MG/ML IJ SOLN
80.0000 mg | Freq: Two times a day (BID) | INTRAMUSCULAR | Status: DC
  Administered 2019-05-20 – 2019-05-23 (×6): 80 mg via INTRAVENOUS
  Filled 2019-05-20 (×6): qty 8

## 2019-05-20 MED ORDER — ACETAZOLAMIDE SODIUM 500 MG IJ SOLR
500.0000 mg | Freq: Two times a day (BID) | INTRAMUSCULAR | Status: AC
  Administered 2019-05-20: 500 mg via INTRAVENOUS
  Filled 2019-05-20: qty 500

## 2019-05-20 MED ORDER — PREDNISONE 20 MG PO TABS
40.0000 mg | ORAL_TABLET | Freq: Every day | ORAL | Status: DC
  Administered 2019-05-20 – 2019-05-26 (×7): 40 mg via ORAL
  Filled 2019-05-20 (×7): qty 2

## 2019-05-20 MED ORDER — CLONAZEPAM 0.125 MG PO TBDP
0.2500 mg | ORAL_TABLET | Freq: Two times a day (BID) | ORAL | Status: DC | PRN
  Administered 2019-05-20 – 2019-05-24 (×4): 0.25 mg via ORAL
  Filled 2019-05-20 (×4): qty 2

## 2019-05-20 NOTE — Evaluation (Signed)
Clinical/Bedside Swallow Evaluation Patient Details  Name: [name] MRN: [id number] Date of Birth: 1940-07-10  Today's Date: 05/20/2019 Time: SLP Start Time (ACUTE ONLY): 1018 SLP Stop Time (ACUTE ONLY): 1031 SLP Time Calculation (min) (ACUTE ONLY): 13 min  Past Medical History:  Past Medical History:  Diagnosis Date  . Anxiety   . Arthritis    djd  . Blood transfusion without reported diagnosis   . Cataracts, bilateral    immature   . Cellulitis    10/11/11 hospitalized for cellulitis   . Chicken pox   . Chronic back pain    scoliosis/stenosis  . Depression    takes Zoloft daily  . Elbow fracture, left April 2015  . GERD (gastroesophageal reflux disease)    hx pud '98  . Glaucoma   . Hyperlipemia    takes Atorvastatin daily  . Hypertension    takes HCTZ daily  . Insomnia   . Migraine   . Peripheral neuropathy   . 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 Radiculopathy, lumbar region   . Restless leg syndrome    takes Sinemet daily  . TIA (transient ischemic attack)    Possible TIA per 2013 DC summary  . Vertigo    Past Surgical History:  Past Surgical History:  Procedure Laterality Date  . ABDOMINAL HYSTERECTOMY  1975   bil oophorectomy  . APPENDECTOMY    . BACK SURGERY   MANY YRS AGO   laminectomy x3  . CARPAL TUNNEL RELEASE  07/09/2012   Procedure: CARPAL TUNNEL RELEASE;  Surgeon: Magnus Sinning, MD;  Location: [facility];  Service: Orthopedics;  Laterality: Left;  . cervical disc surgery x2  2011  . CHOLECYSTECTOMY  1993  . colonosocpy    . ESOPHAGOGASTRODUODENOSCOPY    . FINGER ARTHROPLASTY  07/09/2012   Procedure: FINGER ARTHROPLASTY;  Surgeon: Magnus Sinning, MD;  Location: [facility];  Service: Orthopedics;  Laterality: Left;  Interposition Arthroplasty CMC Joint Thumb Left   . HEMATOMA EVACUATION  2011   s/p rt cea  . JOINT REPLACEMENT  '01   total knee replacement, LEFT  . left knee arthroscopy  2001  . LIPOMA EXCISION  07/09/2012   Procedure: EXCISION LIPOMA;  Surgeon: Magnus Sinning, MD;  Location: [facility];  Service: Orthopedics;  Laterality: Left;  Excision Lipoma Dorsum Left Wrist   . LUMBAR LAMINECTOMY WITH COFLEX 1 LEVEL Bilateral 04/19/2015   Procedure: LUMBAR TWO-THREE LUMBAR LAMINECTOMY WITH COFLEX ;  Surgeon: Kristeen Miss, MD;  Location: [facility];  Service: Neurosurgery;  Laterality: Bilateral;  Bilateral L23 laminectomy and foraminotomy with coflex  . LUMBAR LAMINECTOMY/DECOMPRESSION MICRODISCECTOMY Right 12/19/2015   Procedure: Right Lumbar two-three  Microdiskectomy;  Surgeon: Kristeen Miss, MD;  Location: [place];  Service: Neurosurgery;  Laterality: Right;  . ORIF ANKLE FRACTURE Right 09/14/2014   FIBULA   . ORIF ANKLE FRACTURE Right 09/14/2014   Procedure: OPEN REDUCTION INTERNAL FIXATION (ORIF) RIGHT ANKLE FRACTURE/SYNDESMOSIS ;  Surgeon: Wylene Simmer, MD;  Location: [place];  Service: Orthopedics;  Laterality: Right;  . right knee replacement     09/2011   . rt carotid enarterectomy  2011  . rt knee arthroscopy  '99  . SHOULDER ARTHROSCOPY Right   . TOTAL KNEE ARTHROPLASTY  09/13/2011   Procedure: TOTAL KNEE ARTHROPLASTY;  Surgeon: Magnus Sinning, MD;  Location: [facility];  Service: Orthopedics;  Laterality: Right;   HPI:  Pt is a 78 year old female admitted  from SNF with hypoxic respiratory failure after testing positive for COVID-19 on 11/16. PMH includes: HTN, TIA, anxiety, depression, GERD.  SLP initially evaluated swallowing on 11/25 and signed off 12/2 as there were no further concerns for dysphagia. New orders received 12/15 due to concerns for possible aspiration.  Pt has continued to require high FI02;  CT chest 12/12 shows diffuse bilateral groundglass lung opacities.   Assessment / Plan / Recommendation Clinical Impression  Pt participated in limited clinical swallow assessment.  States she is not feeling well this morning, having a hard time breathing. Currently on 15 liters NRB and 5  liters HFNC - Sp02 93%.  States she did not have breakfast but has had some sips of liquid.  Oral mechanism exam is normal. Pt accepted several sips of water/ice chips and ginger ale with no overt s/s of aspiration; however, did not aggressively tax her swallow mechanism given her respiratory status and her desire to be cautious. Pt declined solids. WIll return next date to determine appropriateness for MBS.  Continue current diet (dys3, thin liquids) with HOB elevated for all POs.  Hold tray with obvious s/s of aspiration.  SLP Visit Diagnosis: Dysphagia, unspecified (R13.10)    Aspiration Risk    unknown   Diet Recommendation   continue dys3, thins  Medication Administration: Whole meds with puree    Other  Recommendations Oral Care Recommendations: Oral care BID   Follow up Recommendations None      Frequency and Duration min 2x/week  2 weeks       Prognosis Prognosis for Safe Diet Advancement: Good      Swallow Study   General Date of Onset: 04/29/2019 HPI: Pt is a 78 year old female admitted from SNF with hypoxic respiratory failure after testing positive for COVID-19 on 11/16. PMH includes: HTN, TIA, anxiety, depression, GERD.  SLP initially evaluated swallowing on 11/25 and signed off 12/2 as there were no further concerns for dysphagia. New orders received 12/15 due to concerns for possible aspiration.  Pt has continued to require high FI02;  CT chest 12/12 shows diffuse bilateral groundglass lung opacities. Type of Study: Bedside Swallow Evaluation Previous Swallow Assessment: see HPI Diet Prior to this Study: Dysphagia 3 (soft);Thin liquids Temperature Spikes Noted: No Respiratory Status: Nasal cannula;Non-rebreather History of Recent Intubation: No Behavior/Cognition: Alert Oral Cavity Assessment: Within Functional Limits Oral Care Completed by SLP: No Oral Cavity - Dentition: Poor condition;Missing dentition Vision: Functional for self-feeding Self-Feeding Abilities:  Able to feed self Patient Positioning: Upright in bed Baseline Vocal Quality: Normal Volitional Cough: Strong Volitional Swallow: Able to elicit    Oral/Motor/Sensory Function Overall Oral Motor/Sensory Function: Within functional limits   Ice Chips Ice chips: Within functional limits   Thin Liquid Thin Liquid: Within functional limits    Nectar Thick Nectar Thick Liquid: Not tested   Honey Thick Honey Thick Liquid: Not tested   Puree Puree: Not tested   Solid     Solid: Not tested      Juan Quam Laurice 05/20/2019,10:47 AM  Estill Bamberg L. Tivis Ringer, [place] Office number [phone]

## 2019-05-20 NOTE — Progress Notes (Signed)
   05/20/19 1559  Family/Significant Other Communication  Family/Significant Other Update Called;Updated;Other (Comment) (Call to son, Butch unanswered, VM left at [phone])

## 2019-05-20 NOTE — Consult Note (Addendum)
This is a progress note not consult note.     NAME:  [name], MRN:  [id number], DOB:  [birth year], LOS: 25 ADMISSION DATE:  05/02/2019, CONSULTATION DATE:  12/14 REFERRING MD:  [place] Pink, CHIEF COMPLAINT: on-going hypoxic respiratory failure in sub-acute COVID-19 patient    Brief History   78 year old female patient with multiple comorbidities admitted from skilled nursing facility for COVID-19 pneumonia and associated ARDS.  Initially admitted on 11/20 with acute hypoxic respiratory failure pulmonary asked to see on 12/14 for ongoing hypoxia.   History of present illness   This is a 78 year old female patient Who resides at a skilled nursing facility and tested positive for COVID-19 infection on November 6.  She was hypoxic on day of initial presentation was treated aggressively with supplemental oxygen, Actemra, remdesivir, and complete course of dexamethasone.  Additionally also treated during this hospital stay for healthcare associated pneumonia.  She never required intubation however continues to require high FIO2 now just over 4 weeks from initial diagnosis.  Because she continues to require high FiO2 a CT of chest was obtained This continues to demonstrate diffuse bilateral groundglass lung opacities..  Her most current inflammatory markers show a normal D-dimer and normal CRP.  She has been receiving aggressive diuresis.  He was started back on IV steroids to see if this might make any difference in her oxygenation and additionally pulmonary was asked to evaluate  Past Medical History  Hypertension, diabetes type 2, macrocytic anemia, obesity, dyslipidemia, diastolic heart dysfunction grade 1   Significant Hospital Events   11/20 admitted 11/23 received Actemra 11/25 completed remdesivir 12/7: Started on IV diuresis 12/8: Echocardiogram obtained showed normal LV function RV size normal right atrial pressure estimated 3 mmHg Grade 1 diastolic dysfunction.  Cefepime  completed 12/10: Diuretics escalated 12/13: CT of chest showing diffuse groundglass changes Consults:  Pulmonary consulted  Procedures:    Significant Diagnostic Tests:  Echocardiogram 12/8: Normal LV function RV size normal RA size normal estimated 3 mmHg pressure.  Grade 1 diastolic dysfunction CT chest 11/13: Diffuse groundglass changes  Micro Data:  Respiratory culture 12/1 normal flora  Antimicrobials:   Cefepime 12/2 through 12/8 Interim history/subjective:  Still short of breath but oxygen requirements improving Very anxious Also constipation not relieved with enema  Objective   Blood pressure (!) 153/87, pulse (!) 113, temperature 98 F (36.7 C), temperature source Oral, resp. rate 20, height 5\' 5"  (1.651 m), weight 95.4 kg, SpO2 93 %.        Intake/Output Summary (Last 24 hours) at 05/20/2019 0736 Last data filed at 05/20/2019 0200 Gross per 24 hour  Intake 50 ml  Output 1600 ml  Net -1550 ml   Filed Weights   05/15/19 0500 05/17/19 0343 05/18/19 0400  Weight: 99.4 kg 97.5 kg 95.4 kg    Examination: GEN: obese anxious woman lying in bed HEENT: trachea midline CV: RRR, ext warm PULM: Shallow inspiratory effort, mildly tachypneic, no wheezing GI: Soft, hypoactive BS EXT: No edema obvious but difficult with body habitus NEURO: Moves all 4 ext to command PSYCH: RASS 0 SKIN: No rashes  Cr stable HCO3 up  Assessment & Plan:   # Acute hypoxic respiratory failure-  In setting of COVID, may have progressed to fibrotic stage but should treat any lingering inflammation or fluid overload.  Complicated by significant deconditioning, and obesity.  Improved with diuresis # Anxiety # Refractory constipation  Recommendations - Down to 5L by NRB - Dysphagia diet - Disimpaction (patient states this  is all that works) - Trial of low dose clonazepam for anxiety, could try opiates for dyspnea but this may worsen constipation - Cont aggressive diuresis, add a dose  of diamox - Switch IV steroids to PO, I think these are making her nervous, would do 40mg  x 1 week then 20mg  x 1 week then stop - Up to chair - Have her follow up with Dr. Corky Downs post COVID clinic about 2-3 weeks after discharge, would benefit from sleep study - PCCM will sign off, call if any questions or concerns   Labs   CBC: Recent Labs  Lab 05/14/19 0500 05/15/19 0220 05/16/19 0505 05/17/19 0958 05/18/19 0130  WBC 10.7* 11.9* 12.1* 13.0* 17.0*  HGB 11.1* 11.4* 11.0* 11.5* 10.5*  HCT 35.6* 35.9* 35.0* 35.6* 32.2*  MCV 100.8* 100.6* 100.6* 97.5 98.5  PLT 140* 133* 121* 150 PLATELET CLUMPS NOTED ON SMEAR, COUNT APPEARS ADEQUATE    Basic Metabolic Panel: Recent Labs  Lab 05/15/19 0220 05/16/19 0505 05/17/19 0958 05/18/19 0130 05/20/19 0445  NA 131* 132* 130* 130* 131*  K 4.2 4.1 4.4 4.7 4.7  CL 86* 84* 82* 84* 84*  CO2 35* 37* 35* 33* 39*  GLUCOSE 140* 131* 248* 187* 143*  BUN 19 17 21  27* 30*  CREATININE 0.53 0.62 0.68 0.97 0.58  CALCIUM 9.3 9.4 9.6 9.5 9.7   GFR: Estimated Creatinine Clearance: 67.3 mL/min (by C-G formula based on SCr of 0.58 mg/dL). Recent Labs  Lab 05/15/19 0220 05/16/19 0505 05/17/19 0958 05/18/19 0130  WBC 11.9* 12.1* 13.0* 17.0*    Liver Function Tests: No results for input(s): AST, ALT, ALKPHOS, BILITOT, PROT, ALBUMIN in the last 168 hours. No results for input(s): LIPASE, AMYLASE in the last 168 hours. No results for input(s): AMMONIA in the last 168 hours.  ABG    Component Value Date/Time   PHART 7.465 (H) 04/29/2019 0458   PCO2ART 42.3 04/29/2019 0458   PO2ART 52.0 (L) 04/29/2019 0458   HCO3 30.5 (H) 04/29/2019 0458   TCO2 32 04/29/2019 0458   O2SAT 89.0 04/29/2019 0458     Coagulation Profile: No results for input(s): INR, PROTIME in the last 168 hours.  Cardiac Enzymes: No results for input(s): CKTOTAL, CKMB, CKMBINDEX, TROPONINI in the last 168 hours.  HbA1C: Hgb A1c MFr Bld  Date/Time Value Ref Range Status   05/14/2019 05:00 AM 8.4 (H) 4.8 - 5.6 % Final    Comment:    (NOTE) Pre diabetes:          5.7%-6.4% Diabetes:              >6.4% Glycemic control for   <7.0% adults with diabetes   04/09/2018 03:26 PM 6.3 4.6 - 6.5 % Final    Comment:    Glycemic Control Guidelines for People with Diabetes:Non Diabetic:  <6%Goal of Therapy: <7%Additional Action Suggested:  >8%     CBG: Recent Labs  Lab 05/18/19 2052 05/19/19 0732 05/19/19 1200 05/19/19 1638 05/19/19 [place] MD PCCM

## 2019-05-20 NOTE — Progress Notes (Signed)
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 She does have significant past medical history for hypertension, dyslipidemia, TIA, GERD, anxiety, depression and arthritis. She tested positive for COVID-19 November 6. [name] a nursing home resident.Patient had a dry cough, she was quarantined at a skilled nursing facility. On the day of hospitalization she was noted to be hypoxic, requiring supplemental oxygen per nasal cannula. On her initial physical examination her oximetry was 76% on room air, blood pressure was 101/73, pulse rate 76, respirate 15, oxygen saturation 95% on 6 L of supplemental oxygen per nasal cannula.She had coarse breath sounds bilaterally, heart S1-S2 present rhythmic, abdomen soft, no lower extremity edema. Her chest radiograph had bilateral interstitial infiltrates, right upper lobe and left upper lobe.  Patient was admitted to the hospital working diagnosis of acute hypoxic respiratory failure due to SARS COVID-19 viral pneumonia.   Significant Events:   COVID-19 specific Treatment: Actemra 11/23 Remdesivir Steroid  Subjective: The patient is very anxious.  She reports still feeling short of breath.  She states she feels "about the same as yesterday."  She denies chest pain nausea vomiting or abdominal pain.  Assessment & Plan:  COVID Pneumonia -acute hypoxic respiratory failure Patient has been dosed with Actemra, remdesivir, and steroid and has completed courses of these 3 meds -she was also empirically treated with a course of cefepime -high oxygen requirement persists -CT chest 12/12 revealed persisting groundglass opacities bilaterally -pulmonary has seen in consultation -steroid course has been resumed -venous duplex bilateral lower extremities without evidence of DVT and D-dimer has normalized  Acute diastolic CHF  exacerbation Oxygenation has not improved with high-dose Lasix dosing -TTE confirmed grade 1 diastolic dysfunction with preserved systolic function  Hyponatremia Follow with ongoing diuretic use  Acute metabolic encephalopathy Resolved  Depression Continue Zoloft and trazodone  HTN Blood pressure controlled start  DM2 A1c 8.4 -CBG reasonably controlled today  Hyperlipidemia Continue atorvastatin  Macrocytic anemia Following hemoglobin in serial fashion  Constipation Required disimpaction 3 days ago -continue bowel regimen  Obesity - Estimated body mass index is 35 kg/m as calculated from the following:   Height as of this encounter: 5\' 5"  (1.651 m).   Weight as of this encounter: 95.4 kg.    DVT prophylaxis: Lovenox Code Status: NO CODE - DNR Family Communication:  Disposition Plan:   Consultants:  Pulmonary  Antimicrobials:  None presently  Objective: Blood pressure (!) 157/76, pulse (!) 116, temperature 98.3 F (36.8 C), temperature source Oral, resp. rate 20, height 5\' 5"  (1.651 m), weight 95.4 kg, SpO2 100 %.  Intake/Output Summary (Last 24 hours) at 05/20/2019 1140 Last data filed at 05/20/2019 0915 Gross per 24 hour  Intake 120 ml  Output 2050 ml  Net -1930 ml   Filed Weights   05/15/19 0500 05/17/19 0343 05/18/19 0400  Weight: 99.4 kg 97.5 kg 95.4 kg    Examination: General: No acute respiratory distress Lungs: Poor air movement throughout all fields with no wheezing and fine diffuse crackles appreciable Cardiovascular: Regular rate and rhythm without murmur gallop or rub normal S1 and S2 Abdomen: Nontender, overweight, soft, bowel sounds positive, no rebound, no ascites, no appreciable mass Extremities: 1+ bilateral lower extremity edema  CBC: Recent Labs  Lab 05/16/19 0505 05/17/19 0958 05/18/19 0130  WBC 12.1* 13.0* 17.0*  HGB 11.0* 11.5* 10.5*  HCT 35.0* 35.6* 32.2*  MCV  100.6* 97.5 98.5  PLT 121* 150 PLATELET CLUMPS NOTED ON  SMEAR, COUNT APPEARS ADEQUATE   Basic Metabolic Panel: Recent Labs  Lab 05/17/19 0958 05/18/19 0130 05/20/19 0445  NA 130* 130* 131*  K 4.4 4.7 4.7  CL 82* 84* 84*  CO2 35* 33* 39*  GLUCOSE 248* 187* 143*  BUN 21 27* 30*  CREATININE 0.68 0.97 0.58  CALCIUM 9.6 9.5 9.7   GFR: Estimated Creatinine Clearance: 67.3 mL/min (by C-G formula based on SCr of 0.58 mg/dL).  Liver Function Tests: No results for input(s): AST, ALT, ALKPHOS, BILITOT, PROT, ALBUMIN in the last 168 hours. No results for input(s): LIPASE, AMYLASE in the last 168 hours. No results for input(s): AMMONIA in the last 168 hours.   HbA1C: Hgb A1c MFr Bld  Date/Time Value Ref Range Status  05/14/2019 05:00 AM 8.4 (H) 4.8 - 5.6 % Final    Comment:    (NOTE) Pre diabetes:          5.7%-6.4% Diabetes:              >6.4% Glycemic control for   <7.0% adults with diabetes   04/09/2018 03:26 PM 6.3 4.6 - 6.5 % Final    Comment:    Glycemic Control Guidelines for People with Diabetes:Non Diabetic:  <6%Goal of Therapy: <7%Additional Action Suggested:  >8%     CBG: Recent Labs  Lab 05/19/19 0732 05/19/19 1200 05/19/19 1638 05/19/19 1943 05/20/19 0731  GLUCAP 223* 257* 151* 189* 130*    Scheduled Meds: . aspirin EC  325 mg Oral Daily  . atorvastatin  40 mg Oral q1800  . Chlorhexidine Gluconate Cloth  6 each Topical Daily  . chlorpheniramine-HYDROcodone  5 mL Oral Q12H  . cholecalciferol  1,000 Units Oral Daily  . enoxaparin (LOVENOX) injection  0.5 mg/kg Subcutaneous Q24H  . furosemide  80 mg Intravenous Daily  . gabapentin  600 mg Oral TID WC & HS  . insulin aspart  0-20 Units Subcutaneous TID WC  . insulin aspart  0-5 Units Subcutaneous QHS  . insulin detemir  10 Units Subcutaneous Daily  . Ipratropium-Albuterol  4 puff Inhalation Q6H  . magic mouthwash w/lidocaine  10 mL Oral TID  . mouth rinse  15 mL Mouth Rinse BID  . metoprolol succinate  25 mg Oral Daily  . mometasone-formoterol  2 puff  Inhalation BID  . multivitamin with minerals  1 tablet Oral Daily  . nystatin  5 mL Mouth/Throat QID  . pantoprazole  40 mg Oral Daily  . pneumococcal 23 valent vaccine  0.5 mL Intramuscular Tomorrow-1000  . polyethylene glycol  17 g Oral TID  . predniSONE  40 mg Oral Q breakfast  . pyridOXINE  50 mg Oral Daily  . senna-docusate  2 tablet Oral BID  . sertraline  50 mg Oral Daily  . traZODone  50 mg Oral QHS  . vitamin C  500 mg Oral Daily    LOS: 25 days   Cherene Altes, MD [facility] Office  [phone] Pager - Text Page per Amion  If 7PM-7AM, please contact night-coverage per Amion 05/20/2019, 11:40 AM

## 2019-05-20 NOTE — TOC Progression Note (Signed)
Transition of Care [facility]) - Progression Note    Patient Details  Name: [name] MRN: [id number] Date of Birth: [birth year]  [facility] CM/SW Contact  Shade Flood, LCSW Phone Number: 05/20/2019, 10:05 AM  Clinical Narrative:     TOC following. Plan remains for return to [facility] when stable. Pt currently on NRB 15L.   TOC will follow.   Expected Discharge Plan: [place] Barriers to Discharge: Continued Medical Work up  Expected Discharge Plan and Services Expected Discharge Plan: [place] arrangements for the past 2 months: [place]                 DME Arranged: N/A         HH Arranged: NA           Social Determinants of Health (SDOH) Interventions    Readmission Risk Interventions No flowsheet data found.

## 2019-05-20 NOTE — Progress Notes (Signed)
Called son [name], left voicemail message.

## 2019-05-21 LAB — COMPREHENSIVE METABOLIC PANEL
ALT: 17 U/L (ref 0–44)
AST: 24 U/L (ref 15–41)
Albumin: 3 g/dL — ABNORMAL LOW (ref 3.5–5.0)
Alkaline Phosphatase: 78 U/L (ref 38–126)
Anion gap: 11 (ref 5–15)
BUN: 28 mg/dL — ABNORMAL HIGH (ref 8–23)
CO2: 38 mmol/L — ABNORMAL HIGH (ref 22–32)
Calcium: 9.6 mg/dL (ref 8.9–10.3)
Chloride: 84 mmol/L — ABNORMAL LOW (ref 98–111)
Creatinine, Ser: 0.66 mg/dL (ref 0.44–1.00)
GFR calc Af Amer: >60 mL/min (ref >60)
GFR calc non Af Amer: >60 mL/min (ref >60)
Glucose, Bld: 127 mg/dL — ABNORMAL HIGH (ref 70–99)
Potassium: 3.5 mmol/L (ref 3.5–5.1)
Sodium: 133 mmol/L — ABNORMAL LOW (ref 135–145)
Total Bilirubin: 0.3 mg/dL (ref 0.3–1.2)
Total Protein: 6.3 g/dL — ABNORMAL LOW (ref 6.5–8.1)

## 2019-05-21 LAB — CBC
HCT: 36.5 % (ref 36.0–46.0)
Hemoglobin: 11.4 g/dL — ABNORMAL LOW (ref 12.0–15.0)
MCH: 31.1 pg (ref 26.0–34.0)
MCHC: 31.2 g/dL (ref 30.0–36.0)
MCV: 99.7 fL (ref 80.0–100.0)
Platelets: 202 10*3/uL (ref 150–400)
RBC: 3.66 MIL/uL — ABNORMAL LOW (ref 3.87–5.11)
RDW: 15.2 % (ref 11.5–15.5)
WBC: 14.2 10*3/uL — ABNORMAL HIGH (ref 4.0–10.5)
nRBC: 0 % (ref 0.0–0.2)

## 2019-05-21 LAB — GLUCOSE, CAPILLARY
Glucose-Capillary: 95 mg/dL (ref 70–99)
Glucose-Capillary: 138 mg/dL — ABNORMAL HIGH (ref 70–99)
Glucose-Capillary: 250 mg/dL — ABNORMAL HIGH (ref 70–99)

## 2019-05-21 LAB — C-REACTIVE PROTEIN: CRP: 0.6 mg/dL (ref <1.0)

## 2019-05-21 NOTE — Plan of Care (Signed)
  Problem: Coping: Goal: Psychosocial and spiritual needs will be supported Outcome: Progressing   Problem: Respiratory: Goal: Will maintain a patent airway Outcome: Progressing Goal: Complications related to the disease process, condition or treatment will be avoided or minimized Outcome: Progressing   Problem: Clinical Measurements: Goal: Ability to maintain clinical measurements within normal limits will improve Outcome: Progressing Goal: Will remain free from infection Outcome: Progressing Goal: Respiratory complications will improve Outcome: Progressing Goal: Cardiovascular complication will be avoided Outcome: Progressing   Problem: Activity: Goal: Risk for activity intolerance will decrease Outcome: Progressing   Problem: Nutrition: Goal: Adequate nutrition will be maintained Outcome: Progressing   Problem: Coping: Goal: Level of anxiety will decrease Outcome: Progressing   Problem: Elimination: Goal: Will not experience complications related to urinary retention Outcome: Progressing   Problem: Pain Managment: Goal: General experience of comfort will improve Outcome: Progressing   Problem: Safety: Goal: Ability to remain free from injury will improve Outcome: Progressing

## 2019-05-21 NOTE — Progress Notes (Signed)
Nurse round patient sleeping

## 2019-05-21 NOTE — Progress Notes (Signed)
  Speech Language Pathology Treatment: Dysphagia  Patient Details Name: [name] MRN: [id number] DOB: [birth year] Today's Date: 05/21/2019 Time: 9485-4627 SLP Time Calculation (min) (ACUTE ONLY): 10 min  Assessment / Plan / Recommendation Clinical Impression  Pt feeling better today; transitioned to 15L HFNC and saturating between 90-100% during session.  Pt able to consume regular solids and thin liquids with active mastication, brisk swallow, and no concerns for aspiration.  No cueing needed.  Coordination of swallow/respiratory cycles appears WNL.  No further SLP f/u is needed. No clinical indications of dysphagia.  SLP to s/o.   HPI HPI: Pt is a 78 year old female admitted from SNF with hypoxic respiratory failure after testing positive for COVID-19 on 11/16. PMH includes: HTN, TIA, anxiety, depression, GERD.  SLP initially evaluated swallowing on 11/25 and signed off 12/2 as there were no further concerns for dysphagia. New orders received 12/15 due to concerns for possible aspiration.  Pt has continued to require high FI02;  CT chest 12/12 shows diffuse bilateral groundglass lung opacities.      SLP Plan  All goals met       Recommendations  Diet recommendations: Regular;Thin liquid Liquids provided via: Cup;Straw Medication Administration: Whole meds with puree Supervision: Patient able to self feed Postural Changes and/or Swallow Maneuvers: Seated upright 90 degrees                Oral Care Recommendations: Oral care BID Follow up Recommendations: None SLP Visit Diagnosis: Dysphagia, unspecified (R13.10) Plan: All goals met       GO                Juan Quam Laurice 05/21/2019, 3:34 PM  Issak Goley L. Tivis Ringer, [place] Office number [phone] Pager [phone]

## 2019-05-21 NOTE — Progress Notes (Signed)
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 She tested positive for COVID-19 November 6. Patient had a dry cough, she was quarantined at a skilled nursing facility. On the day of hospitalization she was noted to be hypoxic, requiring supplemental oxygen per nasal cannula. On her initial physical examination her oximetry was 76% on room air, blood pressure was 101/73, pulse rate 76, respirate 15, oxygen saturation 95% on 6 L of supplemental oxygen per nasal cannula.She had coarse breath sounds bilaterally, heart S1-S2 present rhythmic, abdomen soft, no lower extremity edema. Her chest radiograph had bilateral interstitial infiltrates, right upper lobe and left upper lobe.  Patient was admitted to the hospital working diagnosis of acute hypoxic respiratory failure due to SARS COVID-19 viral pneumonia.  Significant Events: 11/6 Covid positive 11/21 admit via [facility] -transferred to the [facility]  COVID-19 specific Treatment: Actemra 11/23 Remdesivir 11/21 > 11/25 Steroid  Subjective: Continues to require high flow nasal cannula support at 15 L but saturations have improved in the upper 90s.  Resting comfortably the time of my visit in no acute distress.  Assessment & Plan:  COVID Pneumonia -acute hypoxic respiratory failure Patient has been dosed with Actemra, remdesivir, and steroid and has completed courses of these 3 meds -she was also empirically treated with a course of cefepime -high oxygen requirement persists -CT chest 12/12 revealed persisting groundglass opacities bilaterally -Pulmonary has seen in consultation -steroid course has been resumed -venous duplex bilateral lower extremities without evidence of DVT and D-dimer has normalized -continue  current management  Acute diastolic CHF exacerbation TTE confirmed grade 1 diastolic dysfunction with preserved systolic function -continue attempt to diurese -patient is net negative approximately 14 L since admission  Hyponatremia Follow with ongoing diuretic use -sodium trending upward at this time  Acute metabolic encephalopathy Resolved  Depression Continue Zoloft and trazodone  HTN Blood pressure somewhat low with ongoing diuresis -continue for now and follow  DM2 A1c 8.4 -CBG reasonably controlled today  Hyperlipidemia Continue atorvastatin  Macrocytic anemia Following hemoglobin in serial fashion  Constipation Required disimpaction 3 days ago -continue bowel regimen  Obesity - Estimated body mass index is 34.93 kg/m as calculated from the following:   Height as of this encounter: 5\' 5"  (1.651 m).   Weight as of this encounter: 95.2 kg.    DVT prophylaxis: Lovenox Code Status: NO CODE - DNR Family Communication:  Disposition Plan:   Consultants:  Pulmonary  Antimicrobials:  Cefepime 12/1 >12/8  Objective: Blood pressure (!) 94/53, pulse (!) 102, temperature 97.6 F (36.4 C), temperature source Oral, resp. rate 20, height 5\' 5"  (1.651 m), weight 95.2 kg, SpO2 97 %.  Intake/Output Summary (Last 24 hours) at 05/21/2019 0934 Last data filed at 05/21/2019 0536 Gross per 24 hour  Intake 120 ml  Output 850 ml  Net -730 ml   Filed Weights   05/17/19 0343 05/18/19 0400 05/21/19 0500  Weight: 97.5 kg 95.4 kg 95.2 kg    Examination: General: No acute respiratory distress -resting comfortably Lungs: Poor air movement throughout all fields -persisting fine diffuse crackles Cardiovascular: RRR without murmur Abdomen: Nontender, overweight, soft, bowel sounds positive Extremities: 1+ bilateral lower extremity edema without change  CBC: Recent Labs  Lab 05/17/19 0958 05/18/19 0130 05/21/19 0500  WBC 13.0* 17.0*  14.2*  HGB 11.5* 10.5* 11.4*  HCT  35.6* 32.2* 36.5  MCV 97.5 98.5 99.7  PLT 150 PLATELET CLUMPS NOTED ON SMEAR, COUNT APPEARS ADEQUATE [id number]   Basic Metabolic Panel: Recent Labs  Lab 05/18/19 0130 05/20/19 0445 05/21/19 0500  NA 130* 131* 133*  K 4.7 4.7 3.5  CL 84* 84* 84*  CO2 33* 39* 38*  GLUCOSE 187* 143* 127*  BUN 27* 30* 28*  CREATININE 0.97 0.58 0.66  CALCIUM 9.5 9.7 9.6   GFR: Estimated Creatinine Clearance: 67.2 mL/min (by C-G formula based on SCr of 0.66 mg/dL).  Liver Function Tests: Recent Labs  Lab 05/21/19 0500  AST 24  ALT 17  ALKPHOS 78  BILITOT 0.3  PROT 6.3*  ALBUMIN 3.0*     HbA1C: Hgb A1c MFr Bld  Date/Time Value Ref Range Status  05/14/2019 05:00 AM 8.4 (H) 4.8 - 5.6 % Final    Comment:    (NOTE) Pre diabetes:          5.7%-6.4% Diabetes:              >6.4% Glycemic control for   <7.0% adults with diabetes   04/09/2018 03:26 PM 6.3 4.6 - 6.5 % Final    Comment:    Glycemic Control Guidelines for People with Diabetes:Non Diabetic:  <6%Goal of Therapy: <7%Additional Action Suggested:  >8%     CBG: Recent Labs  Lab 05/20/19 0731 05/20/19 1200 05/20/19 1650 05/20/19 2037 05/21/19 0730  GLUCAP 130* 196* 189* 230* 95    Scheduled Meds: . aspirin EC  325 mg Oral Daily  . atorvastatin  40 mg Oral q1800  . Chlorhexidine Gluconate Cloth  6 each Topical Daily  . chlorpheniramine-HYDROcodone  5 mL Oral Q12H  . cholecalciferol  1,000 Units Oral Daily  . enoxaparin (LOVENOX) injection  0.5 mg/kg Subcutaneous Q24H  . furosemide  80 mg Intravenous Q12H  . gabapentin  600 mg Oral TID WC & HS  . insulin aspart  0-20 Units Subcutaneous TID WC  . insulin aspart  0-5 Units Subcutaneous QHS  . insulin detemir  10 Units Subcutaneous Daily  . Ipratropium-Albuterol  4 puff Inhalation Q6H  . magic mouthwash w/lidocaine  10 mL Oral TID  . mouth rinse  15 mL Mouth Rinse BID  . metoprolol succinate  25 mg Oral Daily  . mometasone-formoterol  2 puff Inhalation BID  . multivitamin  with minerals  1 tablet Oral Daily  . nystatin  5 mL Mouth/Throat QID  . pantoprazole  40 mg Oral Daily  . pneumococcal 23 valent vaccine  0.5 mL Intramuscular Tomorrow-1000  . polyethylene glycol  17 g Oral TID  . predniSONE  40 mg Oral Q breakfast  . pyridOXINE  50 mg Oral Daily  . senna-docusate  2 tablet Oral BID  . sertraline  50 mg Oral Daily  . traZODone  50 mg Oral QHS  . vitamin C  500 mg Oral Daily    LOS: 26 days   Cherene Altes, MD [facility] Office  [phone] Pager - Text Page per Amion  If 7PM-7AM, please contact night-coverage per Amion 05/21/2019, 9:34 AM

## 2019-05-21 NOTE — Progress Notes (Signed)
RT note: RT weaned patient of NRB and titrated HFNC to 15L for sats of 95%. No increased WOB at this time.

## 2019-05-22 LAB — GLUCOSE, CAPILLARY
Glucose-Capillary: 236 mg/dL — ABNORMAL HIGH (ref 70–99)
Glucose-Capillary: 104 mg/dL — ABNORMAL HIGH (ref 70–99)
Glucose-Capillary: 116 mg/dL — ABNORMAL HIGH (ref 70–99)
Glucose-Capillary: 190 mg/dL — ABNORMAL HIGH (ref 70–99)
Glucose-Capillary: 236 mg/dL — ABNORMAL HIGH (ref 70–99)

## 2019-05-22 LAB — BASIC METABOLIC PANEL
Anion gap: 13 (ref 5–15)
BUN: 30 mg/dL — ABNORMAL HIGH (ref 8–23)
CO2: 37 mmol/L — ABNORMAL HIGH (ref 22–32)
Calcium: 9.3 mg/dL (ref 8.9–10.3)
Chloride: 85 mmol/L — ABNORMAL LOW (ref 98–111)
Creatinine, Ser: 0.63 mg/dL (ref 0.44–1.00)
GFR calc Af Amer: >60 mL/min (ref >60)
GFR calc non Af Amer: >60 mL/min (ref >60)
Glucose, Bld: 139 mg/dL — ABNORMAL HIGH (ref 70–99)
Potassium: 3.5 mmol/L (ref 3.5–5.1)
Sodium: 135 mmol/L (ref 135–145)

## 2019-05-22 MED ORDER — POTASSIUM CHLORIDE CRYS ER 20 MEQ PO TBCR
40.0000 meq | EXTENDED_RELEASE_TABLET | Freq: Two times a day (BID) | ORAL | Status: DC
  Administered 2019-05-22 – 2019-05-24 (×4): 40 meq via ORAL
  Filled 2019-05-22 (×4): qty 2

## 2019-05-22 MED ORDER — ACETAZOLAMIDE 250 MG PO TABS
250.0000 mg | ORAL_TABLET | Freq: Two times a day (BID) | ORAL | Status: AC
  Administered 2019-05-22 (×2): 250 mg via ORAL
  Filled 2019-05-22 (×3): qty 1

## 2019-05-22 NOTE — Progress Notes (Signed)
[name]  [id number] DOB: 04/02/1941 DOA: 04/25/2019 PCP: Virgel Bouquet, MD    Brief Narrative:  78 yo retired [employer] SNF resident with a history of HTN, HLD, GERD, TIA, generalized anxiety disorder, depression, and arthritis who presented with low oxygen saturation. She tested positive for COVID-19 November 6. Patient had a dry cough, she was quarantined at a skilled nursing facility. On the day of hospitalization she was noted to be hypoxic, requiring supplemental oxygen per nasal cannula. On her initial physical examination her oximetry was 76% on room air, blood pressure was 101/73, pulse rate 76, respirate 15, oxygen saturation 95% on 6 L of supplemental oxygen per nasal cannula.She had coarse breath sounds bilaterally, heart S1-S2 present rhythmic, abdomen soft, no lower extremity edema. Her chest radiograph had bilateral interstitial infiltrates, right upper lobe and left upper lobe.  Patient was admitted to the hospital working diagnosis of acute hypoxic respiratory failure due to SARS COVID-19 viral pneumonia.  Significant Events: 11/6 Covid positive 11/21 admit via [facility] -transferred to the [facility]  COVID-19 specific Treatment: Actemra 11/23 Remdesivir 11/21 > 11/25 Decadron 11/21 > 12/2 Solu-Medrol 12/12 > 12/15 Prednisone 12/16 >  Subjective: Continues to require high level oxygen support but is much more alert today.  Does not appear to be in any respiratory distress.  Denies chest pain or abdominal pain.  States that she feels she is a bit better today.  Assessment & Plan:  COVID Pneumonia -acute hypoxic respiratory failure Patient has been dosed with Actemra, remdesivir, and steroid and has completed courses of these 3 meds -she was also empirically treated with a course of cefepime -high oxygen requirement persists -CT chest 12/12 revealed persisting groundglass opacities bilaterally -Pulmonary has seen in consultation -steroid course has been resumed  with tapering of oral prednisone ongoing at this time-venous duplex bilateral lower extremities without evidence of DVT and D-dimer has normalized -continue current management  Acute diastolic CHF exacerbation TTE confirmed grade 1 diastolic dysfunction with preserved systolic function -continue attempt to diurese -patient is net negative approximately 14 L since admission  Hyponatremia Sodium has normalized with ongoing diuresis  Acute metabolic encephalopathy Resolved -much more alert today  Depression Continue Zoloft and trazodone  HTN Blood pressure somewhat low with ongoing diuresis -continue for now and follow  DM2 A1c 8.4 -CBG controlled today  Hyperlipidemia Continue atorvastatin  Macrocytic anemia Following hemoglobin in serial fashion  Constipation Required disimpaction 3 days ago -continue bowel regimen  Obesity - Estimated body mass index is 34.74 kg/m as calculated from the following:   Height as of this encounter: 5\' 5"  (1.651 m).   Weight as of this encounter: 94.7 kg.    DVT prophylaxis: Lovenox Code Status: NO CODE - DNR Family Communication:  Disposition Plan: med surg bed   Consultants:  Pulmonary  Antimicrobials:  Cefepime 12/1 >12/8  Objective: Blood pressure 106/62, pulse (!) 102, temperature (!) 97.5 F (36.4 C), temperature source Oral, resp. rate 16, height 5\' 5"  (1.651 m), weight 94.7 kg, SpO2 100 %.  Intake/Output Summary (Last 24 hours) at 05/22/2019 0859 Last data filed at 05/22/2019 0700 Gross per 24 hour  Intake 840 ml  Output 750 ml  Net 90 ml   Filed Weights   05/18/19 0400 05/21/19 0500 05/22/19 0411  Weight: 95.4 kg 95.2 kg 94.7 kg    Examination: General: No acute respiratory distress -more alert today Lungs: Poor air movement throughout all fields -persisting fine diffuse crackles without wheezing Cardiovascular: RRR -no  rub Abdomen: Nontender, overweight, soft, bowel sounds positive Extremities: 1+ bilateral  lower extremity edema   CBC: Recent Labs  Lab 05/17/19 0958 05/18/19 0130 05/21/19 0500  WBC 13.0* 17.0* 14.2*  HGB 11.5* 10.5* 11.4*  HCT 35.6* 32.2* 36.5  MCV 97.5 98.5 99.7  PLT 150 PLATELET CLUMPS NOTED ON SMEAR, COUNT APPEARS ADEQUATE 123XX123   Basic Metabolic Panel: Recent Labs  Lab 05/20/19 0445 05/21/19 0500 05/22/19 0500  NA 131* 133* 135  K 4.7 3.5 3.5  CL 84* 84* 85*  CO2 39* 38* 37*  GLUCOSE 143* 127* 139*  BUN 30* 28* 30*  CREATININE 0.58 0.66 0.63  CALCIUM 9.7 9.6 9.3   GFR: Estimated Creatinine Clearance: 67 mL/min (by C-G formula based on SCr of 0.63 mg/dL).  Liver Function Tests: Recent Labs  Lab 05/21/19 0500  AST 24  ALT 17  ALKPHOS 78  BILITOT 0.3  PROT 6.3*  ALBUMIN 3.0*     HbA1C: Hgb A1c MFr Bld  Date/Time Value Ref Range Status  05/14/2019 05:00 AM 8.4 (H) 4.8 - 5.6 % Final    Comment:    (NOTE) Pre diabetes:          5.7%-6.4% Diabetes:              >6.4% Glycemic control for   <7.0% adults with diabetes   04/09/2018 03:26 PM 6.3 4.6 - 6.5 % Final    Comment:    Glycemic Control Guidelines for People with Diabetes:Non Diabetic:  <6%Goal of Therapy: <7%Additional Action Suggested:  >8%     CBG: Recent Labs  Lab 05/21/19 0730 05/21/19 1147 05/21/19 1634 05/21/19 2127 05/22/19 0751  GLUCAP 95 138* 250* 236* 104*    Scheduled Meds: . aspirin EC  325 mg Oral Daily  . atorvastatin  40 mg Oral q1800  . Chlorhexidine Gluconate Cloth  6 each Topical Daily  . chlorpheniramine-HYDROcodone  5 mL Oral Q12H  . cholecalciferol  1,000 Units Oral Daily  . enoxaparin (LOVENOX) injection  0.5 mg/kg Subcutaneous Q24H  . furosemide  80 mg Intravenous Q12H  . gabapentin  600 mg Oral TID WC & HS  . insulin aspart  0-20 Units Subcutaneous TID WC  . insulin aspart  0-5 Units Subcutaneous QHS  . insulin detemir  10 Units Subcutaneous Daily  . Ipratropium-Albuterol  4 puff Inhalation Q6H  . magic mouthwash w/lidocaine  10 mL Oral TID  .  mouth rinse  15 mL Mouth Rinse BID  . metoprolol succinate  25 mg Oral Daily  . mometasone-formoterol  2 puff Inhalation BID  . multivitamin with minerals  1 tablet Oral Daily  . pantoprazole  40 mg Oral Daily  . polyethylene glycol  17 g Oral TID  . predniSONE  40 mg Oral Q breakfast  . pyridOXINE  50 mg Oral Daily  . senna-docusate  2 tablet Oral BID  . sertraline  50 mg Oral Daily  . traZODone  50 mg Oral QHS  . vitamin C  500 mg Oral Daily    LOS: 72 days   Cherene Altes, MD [facility] Office  [phone] Pager - Text Page per Amion  If 7PM-7AM, please contact night-coverage per Amion 05/22/2019, 8:59 AM

## 2019-05-22 NOTE — Progress Notes (Signed)
Physical Therapy Treatment Patient Details Name: [name] MRN: [id number] DOB: [birth year] Today's Date: 05/22/2019    History of Present Illness 78 y.o. female past medical history significant for hypertension TIA anxiety depression presents to the hospital from [facility] via EMS for hypoxia she tested positive for COVID-19 on 04/10/2019. pt admitted with acute respiratory failure with hypoxia due to COVID 19 PNA.  Chest xray 12/1 shows small pleural effusion.    PT Comments    The patient reports feeling terrible, found purewick not functioning. Assisted with bed mobility, linen change and repositioning patient in more upright position. ptient declined OOB activity. Continue mobility efforts.  Patient back on 10 L NRB and 15 L HFNC, yesterday, RT was trying to wean off NRB.   Follow Up Recommendations  SNF     Equipment Recommendations    none   Recommendations for Other Services       Precautions / Restrictions Precautions Precautions: Other (comment)(02 desaturation with activity) Precaution Comments: monitor O2; orthostatic, very weak Restrictions Weight Bearing Restrictions: No    Mobility  Bed Mobility Overal bed mobility: Needs Assistance Bed Mobility: Rolling Rolling: Max assist;+2 for safety/equipment         General bed mobility comments: assist with rolling to L/R for pericare/changing of bed linens  Transfers                    Ambulation/Gait                 Stairs             Wheelchair Mobility    Modified Rankin (Stroke Patients Only)       Balance                                            Cognition Arousal/Alertness: Awake/alert Behavior During Therapy: WFL for tasks assessed/performed Overall Cognitive Status: No family/caregiver present to determine baseline cognitive functioning                                 General Comments: appreciative of therapist's help this  session, overall appears [facility] for basic tasks, did not formally assess      Exercises      General Comments General comments (skin integrity, edema, etc.): pt declined OOB today, encouraged OOB with nursing staff this weekend      Pertinent Vitals/Pain Pain Assessment: Faces Faces Pain Scale: Hurts even more Pain Location: back Pain Descriptors / Indicators: Guarding;Grimacing;Discomfort Pain Intervention(s): Monitored during session    Home Living                      Prior Function            PT Goals (current goals can now be found in the care plan section) Acute Rehab PT Goals Patient Stated Goal: none stated today, appreciative of working with therapies Progress towards PT goals: Not progressing toward goals - comment(continues to require HFNC and NRB)    Frequency    Min 2X/week      PT Plan Current plan remains appropriate    Co-evaluation PT/OT/SLP Co-Evaluation/Treatment: Yes Reason for Co-Treatment: For patient/therapist safety PT goals addressed during session: Mobility/safety with mobility OT goals addressed during session: ADL's and self-care  AM-PAC PT "6 Clicks" Mobility   Outcome Measure  Help needed turning from your back to your side while in a flat bed without using bedrails?: A Little Help needed moving from lying on your back to sitting on the side of a flat bed without using bedrails?: A Little Help needed moving to and from a bed to a chair (including a wheelchair)?: A Lot Help needed standing up from a chair using your arms (e.g., wheelchair or bedside chair)?: A Lot Help needed to walk in hospital room?: Total Help needed climbing 3-5 steps with a railing? : Total 6 Click Score: 12    End of Session Equipment Utilized During Treatment: Oxygen   Patient left: in bed;with call bell/phone within reach Nurse Communication: Mobility status PT Visit Diagnosis: Unsteadiness on feet (R26.81);Muscle weakness (generalized)  (M62.81)     Time: [id number] PT Time Calculation (min) (ACUTE ONLY): 28 min  Charges:  $Therapeutic Activity: 8-22 mins                     Tresa Endo PT Acute Rehabilitation Services Pager [phone] Office [phone]    Claretha Cooper 05/22/2019, 4:35 PM

## 2019-05-22 NOTE — Progress Notes (Signed)
Occupational Therapy Treatment Patient Details Name: [name] MRN: [id number] DOB: 1940-07-14 Today's Date: 05/22/2019    History of present illness 78 y.o. female past medical history significant for hypertension TIA anxiety depression presents to the hospital from [facility] via EMS for hypoxia she tested positive for COVID-19 on 04/10/2019. pt admitted with acute respiratory failure with hypoxia due to COVID 19 PNA.  Chest xray 12/1 shows small pleural effusion.   OT comments  Pt making gradual progress towards OT goals, assisted with bed mobility, pericare/changing of bed linens during this session. Pt able to perform bed mobility with overall maxA(+2) with totalA required for pericare. With bed positioned in modified chair position pt completing additional grooming ADL with setup/supervision. Pt on HFNC and NRB with majority of activity, while completing oral care (without NRB) lowest O2 sat noted 82%, returning to >/=90% once task completed and NRB repositioned and maintaining >90% with additional activity. Pt appreciative of assistance/working with therapies today. Will continue per POC.   Follow Up Recommendations  SNF;Supervision/Assistance - 24 hour    Equipment Recommendations  None recommended by OT          Precautions / Restrictions Precautions Precautions: Other (comment)(02 desaturation with activity) Restrictions Weight Bearing Restrictions: No       Mobility Bed Mobility Overal bed mobility: Needs Assistance Bed Mobility: Rolling Rolling: Max assist;+2 for safety/equipment         General bed mobility comments: assist with rolling to L/R for pericare/changing of bed linens  Transfers                          ADL either performed or assessed with clinical judgement   ADL Overall ADL's : Needs assistance/impaired     Grooming: Oral care;Set up;Min guard;Sitting Grooming Details (indicate cue type and reason): bed level with bed in  modified chair position                     Toileting- Clothing Manipulation and Hygiene: Total assistance;Bed level Toileting - Clothing Manipulation Details (indicate cue type and reason): assist for pericare/LB bathing at bed level as pt's purewick not working and pt/bed linens soiled      Functional mobility during ADLs: Maximal assistance;+2 for physical assistance(bed mobility)       Vision       Perception     Praxis      Cognition Arousal/Alertness: Awake/alert Behavior During Therapy: WFL for tasks assessed/performed Overall Cognitive Status: No family/caregiver present to determine baseline cognitive functioning                                 General Comments: appreciative of therapist's help this session, overall appears [facility] for basic tasks, did not formally assess        Exercises     Shoulder Instructions       General Comments pt declined OOB today, encouraged OOB with nursing staff this weekend    Pertinent Vitals/ Pain       Pain Assessment: Faces Faces Pain Scale: Hurts even more Pain Location: back Pain Descriptors / Indicators: Guarding;Grimacing;Discomfort Pain Intervention(s): Limited activity within patient's tolerance;Monitored during session;Repositioned  Home Living  Prior Functioning/Environment              Frequency  Min 2X/week        Progress Toward Goals  OT Goals(current goals can now be found in the care plan section)  Progress towards OT goals: Progressing toward goals  Acute Rehab OT Goals Patient Stated Goal: none stated today, appreciative of working with therapies OT Goal Formulation: With patient Time For Goal Achievement: 06/05/19 Potential to Achieve Goals: Good ADL Goals Pt Will Perform Upper Body Bathing: sitting;with set-up Pt Will Perform Lower Body Bathing: with mod assist;sit to/from stand;sitting/lateral leans Pt Will  Transfer to Toilet: with min assist;bedside commode;squat pivot transfer Pt/caregiver will Perform Home Exercise Program: Both right and left upper extremity;With theraband;With written HEP provided;With Supervision;Increased strength  Plan Discharge plan remains appropriate    Co-evaluation    PT/OT/SLP Co-Evaluation/Treatment: Yes Reason for Co-Treatment: For patient/therapist safety;To address functional/ADL transfers   OT goals addressed during session: ADL's and self-care      AM-PAC OT "6 Clicks" Daily Activity     Outcome Measure   Help from another person eating meals?: None Help from another person taking care of personal grooming?: A Little Help from another person toileting, which includes using toliet, bedpan, or urinal?: Total Help from another person bathing (including washing, rinsing, drying)?: A Lot Help from another person to put on and taking off regular upper body clothing?: A Lot Help from another person to put on and taking off regular lower body clothing?: Total 6 Click Score: 13    End of Session Equipment Utilized During Treatment: Oxygen  OT Visit Diagnosis: Unsteadiness on feet (R26.81);Other abnormalities of gait and mobility (R26.89);Muscle weakness (generalized) (M62.81);Pain Pain - part of body: (back)   Activity Tolerance Patient tolerated treatment well   Patient Left in bed;with call bell/phone within reach   Nurse Communication Mobility status        Time: 1444-1510 OT Time Calculation (min): 26 min  Charges: OT General Charges $OT Visit: 1 Visit OT Treatments $Self Care/Home Management : 8-22 mins  Lou Cal, [place] Pager [phone] Office [facility] 05/22/2019, 4:29 PM

## 2019-05-23 LAB — BASIC METABOLIC PANEL
Anion gap: 11 (ref 5–15)
BUN: 29 mg/dL — ABNORMAL HIGH (ref 8–23)
CO2: 40 mmol/L — ABNORMAL HIGH (ref 22–32)
Calcium: 9.3 mg/dL (ref 8.9–10.3)
Chloride: 85 mmol/L — ABNORMAL LOW (ref 98–111)
Creatinine, Ser: 0.71 mg/dL (ref 0.44–1.00)
GFR calc Af Amer: >60 mL/min (ref >60)
GFR calc non Af Amer: >60 mL/min (ref >60)
Glucose, Bld: 142 mg/dL — ABNORMAL HIGH (ref 70–99)
Potassium: 3.9 mmol/L (ref 3.5–5.1)
Sodium: 136 mmol/L (ref 135–145)

## 2019-05-23 LAB — GLUCOSE, CAPILLARY
Glucose-Capillary: 119 mg/dL — ABNORMAL HIGH (ref 70–99)
Glucose-Capillary: 145 mg/dL — ABNORMAL HIGH (ref 70–99)
Glucose-Capillary: 168 mg/dL — ABNORMAL HIGH (ref 70–99)
Glucose-Capillary: 123 mg/dL — ABNORMAL HIGH (ref 70–99)

## 2019-05-23 LAB — MAGNESIUM: Magnesium: 2.2 mg/dL (ref 1.7–2.4)

## 2019-05-23 MED ORDER — FUROSEMIDE 10 MG/ML IJ SOLN
80.0000 mg | Freq: Three times a day (TID) | INTRAMUSCULAR | Status: DC
  Administered 2019-05-23 – 2019-05-25 (×6): 80 mg via INTRAVENOUS
  Filled 2019-05-23 (×6): qty 8

## 2019-05-23 MED ORDER — BISACODYL 10 MG RE SUPP
10.0000 mg | Freq: Every day | RECTAL | Status: DC | PRN
  Filled 2019-05-23: qty 1

## 2019-05-23 NOTE — Progress Notes (Signed)
[name]  [id number] DOB: [birth year] DOA: 05/04/2019 PCP: Virgel Bouquet, MD    Brief Narrative:  78 yo retired [employer] SNF resident with a history of HTN, HLD, GERD, TIA, generalized anxiety disorder, depression, and arthritis who presented with low oxygen saturation. She tested positive for COVID-19 November 6. Patient had a dry cough, she was quarantined at a skilled nursing facility. On the day of hospitalization she was noted to be hypoxic, requiring supplemental oxygen per nasal cannula. On her initial physical examination her oximetry was 76% on room air, blood pressure was 101/73, pulse rate 76, respirate 15, oxygen saturation 95% on 6 L of supplemental oxygen per nasal cannula.She had coarse breath sounds bilaterally, heart S1-S2 present rhythmic, abdomen soft, no lower extremity edema. Her chest radiograph had bilateral interstitial infiltrates, right upper lobe and left upper lobe.  Patient was admitted to the hospital working diagnosis of acute hypoxic respiratory failure due to SARS COVID-19 viral pneumonia.  Significant Events: 11/6 Covid positive 11/21 admit via [facility] ED -transferred to the [facility] 12/14 Pulmonary consult   COVID-19 specific Treatment: Actemra 11/23 Remdesivir 11/21 > 11/25 Decadron 11/21 > 12/2 Solu-Medrol 12/12 > 12/15 Prednisone 12/16 >  Subjective: Continues to require high flow nasal cannula support at 15 L.  No apparent distress at the time of my exam today.  Assessment & Plan:  COVID Pneumonia -acute hypoxic respiratory failure Patient has been dosed with Actemra, remdesivir, and steroid and has completed courses of these 3 meds -she was also empirically treated with a course of cefepime -high oxygen requirement persists -CT chest 12/12 revealed persisting groundglass opacities bilaterally -Pulmonary has seen in consultation -steroid course has been resumed with tapering of oral prednisone ongoing at this time-venous duplex  bilateral lower extremities without evidence of DVT and D-dimer has normalized -continue current management  Acute diastolic CHF exacerbation TTE confirmed grade 1 diastolic dysfunction with preserved systolic function -continue attempt to diurese -patient is net negative approximately 14 L since admission  Hyponatremia Sodium has normalized with ongoing diuresis  Acute metabolic encephalopathy Resolved  Depression Continue Zoloft and trazodone  HTN Blood pressure somewhat low with ongoing diuresis -continue for now and follow  DM2 A1c 8.4 -CBG controlled today  Hyperlipidemia Continue atorvastatin  Macrocytic anemia Following hemoglobin in serial fashion  Constipation Required disimpaction 3 days ago -continue bowel regimen  Obesity - Estimated body mass index is 35.22 kg/m as calculated from the following:   Height as of this encounter: 5\' 5"  (1.651 m).   Weight as of this encounter: 96 kg.    DVT prophylaxis: Lovenox Code Status: NO CODE - DNR Family Communication:  Disposition Plan: med surg bed - ?LTACH placement   Consultants:  Pulmonary  Antimicrobials:  Cefepime 12/1 >12/8  Objective: Blood pressure 103/68, pulse (!) 114, temperature 98.4 F (36.9 C), temperature source Oral, resp. rate 19, height 5\' 5"  (1.651 m), weight 96 kg, SpO2 98 %.  Intake/Output Summary (Last 24 hours) at 05/23/2019 0838 Last data filed at 05/23/2019 0050 Gross per 24 hour  Intake 540 ml  Output 500 ml  Net 40 ml   Filed Weights   05/21/19 0500 05/22/19 0411 05/23/19 0420  Weight: 95.2 kg 94.7 kg 96 kg    Examination: General: No acute respiratory distress  Lungs: Poor air movement throughout all fields Cardiovascular: RRR -no rub Extremities: 1+ bilateral lower extremity edema   CBC: Recent Labs  Lab 05/17/19 0958 05/18/19 0130 05/21/19 0500  WBC 13.0* 17.0* 14.2*  HGB 11.5* 10.5* 11.4*  HCT 35.6* 32.2* 36.5  MCV 97.5 98.5 99.7  PLT 150 PLATELET CLUMPS  NOTED ON SMEAR, COUNT APPEARS ADEQUATE 123XX123   Basic Metabolic Panel: Recent Labs  Lab 05/21/19 0500 05/22/19 0500 05/23/19 0200  NA 133* 135 136  K 3.5 3.5 3.9  CL 84* 85* 85*  CO2 38* 37* 40*  GLUCOSE 127* 139* 142*  BUN 28* 30* 29*  CREATININE 0.66 0.63 0.71  CALCIUM 9.6 9.3 9.3  MG  --   --  2.2   GFR: Estimated Creatinine Clearance: 67.5 mL/min (by C-G formula based on SCr of 0.71 mg/dL).  Liver Function Tests: Recent Labs  Lab 05/21/19 0500  AST 24  ALT 17  ALKPHOS 78  BILITOT 0.3  PROT 6.3*  ALBUMIN 3.0*     HbA1C: Hgb A1c MFr Bld  Date/Time Value Ref Range Status  05/14/2019 05:00 AM 8.4 (H) 4.8 - 5.6 % Final    Comment:    (NOTE) Pre diabetes:          5.7%-6.4% Diabetes:              >6.4% Glycemic control for   <7.0% adults with diabetes   04/09/2018 03:26 PM 6.3 4.6 - 6.5 % Final    Comment:    Glycemic Control Guidelines for People with Diabetes:Non Diabetic:  <6%Goal of Therapy: <7%Additional Action Suggested:  >8%     CBG: Recent Labs  Lab 05/22/19 0751 05/22/19 1146 05/22/19 1608 05/22/19 1948 05/23/19 0731  GLUCAP 104* 116* 190* 236* 119*    Scheduled Meds: . aspirin EC  325 mg Oral Daily  . atorvastatin  40 mg Oral q1800  . Chlorhexidine Gluconate Cloth  6 each Topical Daily  . chlorpheniramine-HYDROcodone  5 mL Oral Q12H  . cholecalciferol  1,000 Units Oral Daily  . enoxaparin (LOVENOX) injection  0.5 mg/kg Subcutaneous Q24H  . furosemide  80 mg Intravenous Q12H  . gabapentin  600 mg Oral TID WC & HS  . insulin aspart  0-20 Units Subcutaneous TID WC  . insulin aspart  0-5 Units Subcutaneous QHS  . insulin detemir  10 Units Subcutaneous Daily  . Ipratropium-Albuterol  4 puff Inhalation Q6H  . magic mouthwash w/lidocaine  10 mL Oral TID  . mouth rinse  15 mL Mouth Rinse BID  . metoprolol succinate  25 mg Oral Daily  . mometasone-formoterol  2 puff Inhalation BID  . multivitamin with minerals  1 tablet Oral Daily  .  pantoprazole  40 mg Oral Daily  . polyethylene glycol  17 g Oral TID  . potassium chloride  40 mEq Oral BID  . predniSONE  40 mg Oral Q breakfast  . pyridOXINE  50 mg Oral Daily  . senna-docusate  2 tablet Oral BID  . sertraline  50 mg Oral Daily  . traZODone  50 mg Oral QHS  . vitamin C  500 mg Oral Daily    LOS: 28 days   Cherene Altes, MD [facility] Office  [phone] Pager - Text Page per Amion  If 7PM-7AM, please contact night-coverage per Amion 05/23/2019, 8:38 AM

## 2019-05-23 NOTE — Plan of Care (Signed)
  Problem: Coping: Goal: Psychosocial and spiritual needs will be supported Outcome: Progressing   Problem: Respiratory: Goal: Will maintain a patent airway Outcome: Progressing Goal: Complications related to the disease process, condition or treatment will be avoided or minimized Outcome: Progressing   Problem: Clinical Measurements: Goal: Ability to maintain clinical measurements within normal limits will improve Outcome: Progressing Goal: Will remain free from infection Outcome: Progressing Goal: Respiratory complications will improve Outcome: Progressing Goal: Cardiovascular complication will be avoided Outcome: Progressing   Problem: Activity: Goal: Risk for activity intolerance will decrease Outcome: Progressing   Problem: Nutrition: Goal: Adequate nutrition will be maintained Outcome: Progressing

## 2019-05-24 ENCOUNTER — Inpatient Hospital Stay (HOSPITAL_COMMUNITY): Payer: Medicare HMO

## 2019-05-24 LAB — BASIC METABOLIC PANEL
Anion gap: 9 (ref 5–15)
BUN: 23 mg/dL (ref 8–23)
CO2: 33 mmol/L — ABNORMAL HIGH (ref 22–32)
Calcium: 8 mg/dL — ABNORMAL LOW (ref 8.9–10.3)
Chloride: 101 mmol/L (ref 98–111)
Creatinine, Ser: 0.58 mg/dL (ref 0.44–1.00)
GFR calc Af Amer: >60 mL/min (ref >60)
GFR calc non Af Amer: >60 mL/min (ref >60)
Glucose, Bld: 95 mg/dL (ref 70–99)
Potassium: 3.3 mmol/L — ABNORMAL LOW (ref 3.5–5.1)
Sodium: 143 mmol/L (ref 135–145)

## 2019-05-24 LAB — GLUCOSE, CAPILLARY
Glucose-Capillary: 113 mg/dL — ABNORMAL HIGH (ref 70–99)
Glucose-Capillary: 185 mg/dL — ABNORMAL HIGH (ref 70–99)
Glucose-Capillary: 235 mg/dL — ABNORMAL HIGH (ref 70–99)

## 2019-05-24 LAB — MAGNESIUM: Magnesium: 1.8 mg/dL (ref 1.7–2.4)

## 2019-05-24 MED ORDER — TRAMADOL HCL 50 MG PO TABS
50.0000 mg | ORAL_TABLET | Freq: Four times a day (QID) | ORAL | Status: DC | PRN
  Administered 2019-05-25: 50 mg via ORAL
  Filled 2019-05-24: qty 1

## 2019-05-24 MED ORDER — POTASSIUM CHLORIDE CRYS ER 20 MEQ PO TBCR: 40.0000 meq | EXTENDED_RELEASE_TABLET | Freq: Three times a day (TID) | ORAL | Status: DC

## 2019-05-24 MED ORDER — POTASSIUM CHLORIDE 10 MEQ/100ML IV SOLN
10.0000 meq | INTRAVENOUS | Status: AC
  Administered 2019-05-24 (×5): 10 meq via INTRAVENOUS
  Filled 2019-05-24: qty 100

## 2019-05-24 MED ORDER — CLONAZEPAM 0.125 MG PO TBDP
0.2500 mg | ORAL_TABLET | Freq: Two times a day (BID) | ORAL | Status: DC | PRN
  Administered 2019-05-25 – 2019-05-26 (×4): 0.25 mg via ORAL
  Filled 2019-05-24 (×4): qty 2

## 2019-05-24 MED ORDER — LACTULOSE 10 GM/15ML PO SOLN
10.0000 g | Freq: Two times a day (BID) | ORAL | Status: DC
  Administered 2019-05-24 – 2019-05-27 (×5): 10 g via ORAL
  Filled 2019-05-24 (×6): qty 15

## 2019-05-24 MED ORDER — IPRATROPIUM-ALBUTEROL 20-100 MCG/ACT IN AERS
4.0000 | INHALATION_SPRAY | Freq: Three times a day (TID) | RESPIRATORY_TRACT | Status: DC
  Administered 2019-05-24 – 2019-05-28 (×12): 4 via RESPIRATORY_TRACT
  Filled 2019-05-24: qty 4

## 2019-05-24 MED ORDER — SORBITOL 70 % SOLN
960.0000 mL | TOPICAL_OIL | Freq: Once | ORAL | Status: AC
  Administered 2019-05-24: 960 mL via RECTAL
  Filled 2019-05-24: qty 473

## 2019-05-24 NOTE — Progress Notes (Signed)
[name]  [id number] DOB: Oct 02, 1940 DOA: 04/07/2019 PCP: Virgel Bouquet, MD    Brief Narrative:  78 yo retired [employer] SNF resident with a history of HTN, HLD, GERD, TIA, generalized anxiety disorder, depression, and arthritis who presented with low oxygen saturation. She tested positive for COVID-19 November 6. Patient had a dry cough, she was quarantined at a skilled nursing facility. On the day of hospitalization she was noted to be hypoxic, requiring supplemental oxygen per nasal cannula. On her initial physical examination her oximetry was 76% on room air, blood pressure was 101/73, pulse rate 76, respirate 15, oxygen saturation 95% on 6 L of supplemental oxygen per nasal cannula.She had coarse breath sounds bilaterally, heart S1-S2 present rhythmic, abdomen soft, no lower extremity edema. Her chest radiograph had bilateral interstitial infiltrates, right upper lobe and left upper lobe.  Patient was admitted to the hospital working diagnosis of acute hypoxic respiratory failure due to SARS COVID-19 viral pneumonia.  Significant Events: 11/6 Covid positive 11/21 admit via [facility] -transferred to the [facility] 12/14 Pulmonary consult   COVID-19 specific Treatment: Actemra 11/23 Remdesivir 11/21 > 11/25 Decadron 11/21 > 12/2 Solu-Medrol 12/12 > 12/15 Prednisone 12/16 >  Subjective: Reports that she feels very uncomfortable due to "a hard ball of stool stuck in her rectum."  Nurses report patient had a very large bowel movement following an enema approximately 36 hours ago.  Patient makes repeated requests for manual disimpaction.  She denies having had an enema or a bowel movement for many days now.  She denies shortness of breath or chest pain.  Assessment & Plan:  COVID Pneumonia -acute hypoxic respiratory failure Patient has been dosed with Actemra, remdesivir, and steroid and has completed courses of these 3 meds -she was also empirically treated with a course of  cefepime -high oxygen requirement persists -CT chest 12/12 revealed persisting groundglass opacities bilaterally -Pulmonary has seen in consultation -steroid course has been resumed with tapering of oral prednisone ongoing at this time-venous duplex bilateral lower extremities without evidence of DVT and D-dimer has normalized -continue current management - further recovery, if occurs, will be over a protracted course   Acute diastolic CHF exacerbation TTE confirmed grade 1 diastolic dysfunction with preserved systolic function -continue attempt to diurese -patient is net negative approximately 15 L since admission  Hyponatremia Sodium has normalized with ongoing diuresis  Hypokalemia Due to ongoing high-dose diuretic -increase supplementation and follow -follow magnesium intermittently  Acute metabolic encephalopathy Resolved  Depression Continue Zoloft and trazodone  HTN Blood pressure trending upward -follow without change in treatment at this time  DM2 A1c 8.4 -CBG controlled today  Hyperlipidemia Continue atorvastatin  Macrocytic anemia Following hemoglobin in serial fashion  Constipation Nursing notes confirm a bowel movement December 18th -KUB unfortunately did not capture pelvis but does note a paucity of bowel gas -dosed with enema today -wish to avoid repeat manual disimpaction for fear of accidental injury  Obesity - Estimated body mass index is 35.22 kg/m as calculated from the following:   Height as of this encounter: 5\' 5"  (1.651 m).   Weight as of this encounter: 96 kg.    DVT prophylaxis: Lovenox Code Status: NO CODE - DNR Family Communication:  Disposition Plan: med surg bed -appears she would be appropriate for [facility] placement   Consultants:  Pulmonary  Antimicrobials:  Cefepime 12/1 >12/8  Objective: Blood pressure (!) 141/68, pulse (!) 123, temperature 99.1 F (37.3 C), temperature source Axillary, resp. rate 20, height 5'  5" (1.651 m), weight  96 kg, SpO2 99 %.  Intake/Output Summary (Last 24 hours) at 05/24/2019 1007 Last data filed at 05/24/2019 0135 Gross per 24 hour  Intake --  Output 900 ml  Net -900 ml   Filed Weights   05/21/19 0500 05/22/19 0411 05/23/19 0420  Weight: 95.2 kg 94.7 kg 96 kg    Examination: General: No acute respiratory distress  Lungs: Distant breath sounds in all fields with no active wheezing Cardiovascular: RRR without appreciable murmur or rub Extremities: 1+ bilateral lower extremity edema without change  CBC: Recent Labs  Lab 05/18/19 0130 05/21/19 0500  WBC 17.0* 14.2*  HGB 10.5* 11.4*  HCT 32.2* 36.5  MCV 98.5 99.7  PLT PLATELET CLUMPS NOTED ON SMEAR, COUNT APPEARS ADEQUATE 123XX123   Basic Metabolic Panel: Recent Labs  Lab 05/22/19 0500 05/23/19 0200 05/24/19 0500  NA 135 136 143  K 3.5 3.9 3.3*  CL 85* 85* 101  CO2 37* 40* 33*  GLUCOSE 139* 142* 95  BUN 30* 29* 23  CREATININE 0.63 0.71 0.58  CALCIUM 9.3 9.3 8.0*  MG  --  2.2 1.8   GFR: Estimated Creatinine Clearance: 67.5 mL/min (by C-G formula based on SCr of 0.58 mg/dL).  Liver Function Tests: Recent Labs  Lab 05/21/19 0500  AST 24  ALT 17  ALKPHOS 78  BILITOT 0.3  PROT 6.3*  ALBUMIN 3.0*     HbA1C: Hgb A1c MFr Bld  Date/Time Value Ref Range Status  05/14/2019 05:00 AM 8.4 (H) 4.8 - 5.6 % Final    Comment:    (NOTE) Pre diabetes:          5.7%-6.4% Diabetes:              >6.4% Glycemic control for   <7.0% adults with diabetes   04/09/2018 03:26 PM 6.3 4.6 - 6.5 % Final    Comment:    Glycemic Control Guidelines for People with Diabetes:Non Diabetic:  <6%Goal of Therapy: <7%Additional Action Suggested:  >8%     CBG: Recent Labs  Lab 05/23/19 0731 05/23/19 1116 05/23/19 1712 05/23/19 2211 05/24/19 0900  GLUCAP 119* 145* 168* 123* 113*    Scheduled Meds: . aspirin EC  325 mg Oral Daily  . atorvastatin  40 mg Oral q1800  . Chlorhexidine Gluconate Cloth  6 each Topical Daily  .  chlorpheniramine-HYDROcodone  5 mL Oral Q12H  . cholecalciferol  1,000 Units Oral Daily  . enoxaparin (LOVENOX) injection  0.5 mg/kg Subcutaneous Q24H  . furosemide  80 mg Intravenous Q8H  . gabapentin  600 mg Oral TID WC & HS  . insulin aspart  0-20 Units Subcutaneous TID WC  . insulin aspart  0-5 Units Subcutaneous QHS  . insulin detemir  10 Units Subcutaneous Daily  . Ipratropium-Albuterol  4 puff Inhalation TID  . magic mouthwash w/lidocaine  10 mL Oral TID  . mouth rinse  15 mL Mouth Rinse BID  . metoprolol succinate  25 mg Oral Daily  . mometasone-formoterol  2 puff Inhalation BID  . multivitamin with minerals  1 tablet Oral Daily  . pantoprazole  40 mg Oral Daily  . polyethylene glycol  17 g Oral TID  . potassium chloride  40 mEq Oral BID  . predniSONE  40 mg Oral Q breakfast  . pyridOXINE  50 mg Oral Daily  . senna-docusate  2 tablet Oral BID  . sertraline  50 mg Oral Daily  . traZODone  50 mg Oral QHS  .  vitamin C  500 mg Oral Daily    LOS: 23 days   Cherene Altes, MD [facility] Office  [phone] Pager - Text Page per Amion  If 7PM-7AM, please contact night-coverage per Amion 05/24/2019, 10:07 AM

## 2019-05-25 LAB — TYPE AND SCREEN
ABO/RH(D): O POS
Antibody Screen: NEGATIVE

## 2019-05-25 LAB — GLUCOSE, CAPILLARY
Glucose-Capillary: 217 mg/dL — ABNORMAL HIGH (ref 70–99)
Glucose-Capillary: 98 mg/dL (ref 70–99)
Glucose-Capillary: 84 mg/dL (ref 70–99)
Glucose-Capillary: 118 mg/dL — ABNORMAL HIGH (ref 70–99)
Glucose-Capillary: 192 mg/dL — ABNORMAL HIGH (ref 70–99)

## 2019-05-25 LAB — HEMOGLOBIN AND HEMATOCRIT, BLOOD
HCT: 37.1 % (ref 36.0–46.0)
Hemoglobin: 11.4 g/dL — ABNORMAL LOW (ref 12.0–15.0)

## 2019-05-25 MED ORDER — FUROSEMIDE 10 MG/ML IJ SOLN
60.0000 mg | Freq: Three times a day (TID) | INTRAMUSCULAR | Status: DC
  Administered 2019-05-26 (×2): 60 mg via INTRAVENOUS
  Filled 2019-05-25 (×2): qty 6

## 2019-05-25 MED ORDER — POTASSIUM CHLORIDE CRYS ER 20 MEQ PO TBCR: 40.0000 meq | EXTENDED_RELEASE_TABLET | Freq: Once | ORAL | Status: DC

## 2019-05-25 MED ORDER — POTASSIUM CHLORIDE 10 MEQ/100ML IV SOLN
10.0000 meq | INTRAVENOUS | Status: AC
  Administered 2019-05-25 (×5): 10 meq via INTRAVENOUS
  Filled 2019-05-25: qty 100

## 2019-05-25 MED ORDER — HYDROCORT-PRAMOXINE (PERIANAL) 1-1 % EX FOAM
1.0000 | Freq: Three times a day (TID) | CUTANEOUS | Status: DC | PRN
  Administered 2019-05-27: 1 via RECTAL
  Filled 2019-05-25: qty 10

## 2019-05-25 MED ORDER — SIMETHICONE 80 MG PO CHEW
80.0000 mg | CHEWABLE_TABLET | Freq: Four times a day (QID) | ORAL | Status: DC | PRN
  Administered 2019-05-25 – 2019-05-27 (×3): 80 mg via ORAL
  Filled 2019-05-25 (×4): qty 1

## 2019-05-25 NOTE — Progress Notes (Deleted)
Excessive coughing noted when swallowing thin liquids.

## 2019-05-25 NOTE — Progress Notes (Addendum)
Report Received from Oliver Hum, RN and after administering a SMOG enema at [facility] on 05/24/19, a small amount of bright red blood he reports that the patient did have a successful BM that was soft, brown and had a small amount of bright red blood. The patient is on Lovenox and per the care notes has been manually disimpacted multiple times. Pt still complains of pain in her rectum. RN requested something to help with the gas and pt continues to state that she has no relief. Will encourage OOB today, per care notes pt has not been out of bed in at least 7 days. Will continue to manage chronic back pain with medications. Will continue to monitor.

## 2019-05-25 NOTE — TOC Progression Note (Signed)
Transition of Care [facility]) - Progression Note    Patient Details  Name: [name] MRN: [id number] Date of Birth: 21-Jul-1940  Transition of Care [facility]) CM/SW Contact  Joaquin Courts, RN Phone Number: 05/25/2019, 11:01 AM  Clinical Narrative:   CM received consult for LTACH placement. CM gave referral to [place] [phone]) who states they will review patient's chart to see if she is appropriate for LTACH. REP also states if appropriate patient will need insurance auth, will need to be greater than 10 days post positive covid test, afebrile for 72 hours or more, and have an improving or stable respiratory status. CM called [place] [facility] but was unable to get in touch with admissions coordinator. CM will await confirmation that patient is a candidate for LTACH.      Expected Discharge Plan: [place] Barriers to Discharge: Continued Medical Work up  Expected Discharge Plan and Services Expected Discharge Plan: [place] arrangements for the past 2 months: [place]                 DME Arranged: N/A         HH Arranged: NA           Social Determinants of Health (SDOH) Interventions    Readmission Risk Interventions No flowsheet data found.

## 2019-05-25 NOTE — Progress Notes (Signed)
[name]  [id number] DOB: 1941/04/18 DOA: 05/04/2019 PCP: Virgel Bouquet, MD    Brief Narrative:  78 yo retired [employer] SNF resident with a history of HTN, HLD, GERD, TIA, generalized anxiety disorder, depression, and arthritis who presented with low oxygen saturation. She tested positive for COVID-19 November 6. Patient had a dry cough, she was quarantined at a skilled nursing facility. On the day of hospitalization she was noted to be hypoxic, requiring supplemental oxygen per nasal cannula. On her initial physical examination her oximetry was 76% on room air, blood pressure was 101/73, pulse rate 76, respirate 15, oxygen saturation 95% on 6 L of supplemental oxygen per nasal cannula.She had coarse breath sounds bilaterally, heart S1-S2 present rhythmic, abdomen soft, no lower extremity edema. Her chest radiograph had bilateral interstitial infiltrates, right upper lobe and left upper lobe.  Patient was admitted to the hospital working diagnosis of acute hypoxic respiratory failure due to SARS COVID-19 viral pneumonia.  Significant Events: 11/6 Covid positive 11/21 admit via [facility] -transferred to the [facility] 12/14 Pulmonary consult   COVID-19 specific Treatment: Actemra 11/23 Remdesivir 11/21 > 11/25 Decadron 11/21 > 12/2 Solu-Medrol 12/12 > 12/15 Prednisone 12/16 >  Subjective: Reports that she still feels pressure in her rectum though she now understands that she is not impacted.  Reports ongoing shortness of breath.  Denies new chest pain or abdominal pain.  Assessment & Plan:  COVID Pneumonia -acute hypoxic respiratory failure Patient has been dosed with Actemra, remdesivir, and steroid and has completed courses of these 3 meds - she was also empirically treated with a course of cefepime -high oxygen requirement persists -CT chest 12/12 revealed persisting groundglass opacities bilaterally -Pulmonary has seen in consultation -steroid course has been resumed with  tapering of oral prednisone ongoing at this time-venous duplex bilateral lower extremities without evidence of DVT and D-dimer has normalized -continue current management - further recovery, if occurs, will be over a protracted course   Acute diastolic CHF exacerbation TTE confirmed grade 1 diastolic dysfunction with preserved systolic function -continue attempt to diurese -patient is net negative approximately 15 L since admission  Hyponatremia Sodium has normalized with ongoing diuresis  Persistent hypokalemia Due to ongoing high-dose diuretic -continue supplementation and follow -follow magnesium intermittently  Acute metabolic encephalopathy Resolved  Depression Continue Zoloft and trazodone  HTN Blood pressure controlled at this time  DM2 A1c 8.4 -CBG controlled today  Hyperlipidemia Continue atorvastatin  Macrocytic anemia Following hemoglobin in serial fashion  Constipation Nursing notes confirm a bowel movement December 18th -KUB unfortunately did not capture pelvis but does note a paucity of bowel gas -dosed with enema yesterday and today with nursing is confirming stool output and lack of impaction at time of enema insertion   Obesity - Estimated body mass index is 34.16 kg/m as calculated from the following:   Height as of this encounter: 5\' 5"  (1.651 m).   Weight as of this encounter: 93.1 kg.    DVT prophylaxis: Lovenox Code Status: NO CODE - DNR Family Communication:  Disposition Plan: med surg bed -appears she would be [facility] for [facility] placement   Consultants:  Pulmonary  Antimicrobials:  Cefepime 12/1 >12/8  Objective: Blood pressure 140/77, pulse (!) 116, temperature 97.6 F (36.4 C), temperature source Axillary, resp. rate 20, height 5\' 5"  (1.651 m), weight 93.1 kg, SpO2 96 %.  Intake/Output Summary (Last 24 hours) at 05/25/2019 1612 Last data filed at 05/24/2019 2000 Gross per 24 hour  Intake 940 ml  Output 900 ml  Net 40 ml   Filed  Weights   05/22/19 0411 05/23/19 0420 05/25/19 0416  Weight: 94.7 kg 96 kg 93.1 kg    Examination: General: No acute respiratory distress  Lungs: Distant breath sounds in all fields -no wheezing Cardiovascular: RRR without appreciable murmur or rub Extremities: 1+ B LE edema without change  CBC: Recent Labs  Lab 05/21/19 0500  WBC 14.2*  HGB 11.4*  HCT 36.5  MCV 99.7  PLT 123XX123   Basic Metabolic Panel: Recent Labs  Lab 05/22/19 0500 05/23/19 0200 05/24/19 0500  NA 135 136 143  K 3.5 3.9 3.3*  CL 85* 85* 101  CO2 37* 40* 33*  GLUCOSE 139* 142* 95  BUN 30* 29* 23  CREATININE 0.63 0.71 0.58  CALCIUM 9.3 9.3 8.0*  MG  --  2.2 1.8   GFR: Estimated Creatinine Clearance: 66.4 mL/min (by C-G formula based on SCr of 0.58 mg/dL).  Liver Function Tests: Recent Labs  Lab 05/21/19 0500  AST 24  ALT 17  ALKPHOS 78  BILITOT 0.3  PROT 6.3*  ALBUMIN 3.0*     HbA1C: Hgb A1c MFr Bld  Date/Time Value Ref Range Status  05/14/2019 05:00 AM 8.4 (H) 4.8 - 5.6 % Final    Comment:    (NOTE) Pre diabetes:          5.7%-6.4% Diabetes:              >6.4% Glycemic control for   <7.0% adults with diabetes   04/09/2018 03:26 PM 6.3 4.6 - 6.5 % Final    Comment:    Glycemic Control Guidelines for People with Diabetes:Non Diabetic:  <6%Goal of Therapy: <7%Additional Action Suggested:  >8%     CBG: Recent Labs  Lab 05/24/19 2056 05/25/19 0719 05/25/19 0731 05/25/19 1117 05/25/19 1547  GLUCAP 217* 98 84 118* 192*    Scheduled Meds: . aspirin EC  325 mg Oral Daily  . atorvastatin  40 mg Oral q1800  . Chlorhexidine Gluconate Cloth  6 each Topical Daily  . cholecalciferol  1,000 Units Oral Daily  . enoxaparin (LOVENOX) injection  0.5 mg/kg Subcutaneous Q24H  . furosemide  80 mg Intravenous Q8H  . gabapentin  600 mg Oral TID WC & HS  . insulin aspart  0-20 Units Subcutaneous TID WC  . insulin aspart  0-5 Units Subcutaneous QHS  . insulin detemir  10 Units Subcutaneous  Daily  . Ipratropium-Albuterol  4 puff Inhalation TID  . lactulose  10 g Oral BID  . magic mouthwash w/lidocaine  10 mL Oral TID  . mouth rinse  15 mL Mouth Rinse BID  . metoprolol succinate  25 mg Oral Daily  . mometasone-formoterol  2 puff Inhalation BID  . multivitamin with minerals  1 tablet Oral Daily  . pantoprazole  40 mg Oral Daily  . polyethylene glycol  17 g Oral TID  . predniSONE  40 mg Oral Q breakfast  . pyridOXINE  50 mg Oral Daily  . senna-docusate  2 tablet Oral BID  . sertraline  50 mg Oral Daily  . traZODone  50 mg Oral QHS  . vitamin C  500 mg Oral Daily    LOS: 30 days   Cherene Altes, MD [facility] Office  [phone] Pager - Text Page per Amion  If 7PM-7AM, please contact night-coverage per Amion 05/25/2019, 4:12 PM

## 2019-05-26 LAB — BASIC METABOLIC PANEL
Anion gap: 12 (ref 5–15)
BUN: 35 mg/dL — ABNORMAL HIGH (ref 8–23)
CO2: 37 mmol/L — ABNORMAL HIGH (ref 22–32)
Calcium: 9.6 mg/dL (ref 8.9–10.3)
Chloride: 89 mmol/L — ABNORMAL LOW (ref 98–111)
Creatinine, Ser: 0.89 mg/dL (ref 0.44–1.00)
GFR calc Af Amer: >60 mL/min (ref >60)
GFR calc non Af Amer: >60 mL/min (ref >60)
Glucose, Bld: 150 mg/dL — ABNORMAL HIGH (ref 70–99)
Potassium: 4.6 mmol/L (ref 3.5–5.1)
Sodium: 138 mmol/L (ref 135–145)

## 2019-05-26 LAB — CBC
HCT: 37.4 % (ref 36.0–46.0)
Hemoglobin: 11.2 g/dL — ABNORMAL LOW (ref 12.0–15.0)
MCH: 31.7 pg (ref 26.0–34.0)
MCHC: 29.9 g/dL — ABNORMAL LOW (ref 30.0–36.0)
MCV: 105.9 fL — ABNORMAL HIGH (ref 80.0–100.0)
Platelets: 599 10*3/uL — ABNORMAL HIGH (ref 150–400)
RBC: 3.53 MIL/uL — ABNORMAL LOW (ref 3.87–5.11)
RDW: 15.4 % (ref 11.5–15.5)
WBC: 16.5 10*3/uL — ABNORMAL HIGH (ref 4.0–10.5)
nRBC: 0.4 % — ABNORMAL HIGH (ref 0.0–0.2)

## 2019-05-26 LAB — GLUCOSE, CAPILLARY
Glucose-Capillary: 143 mg/dL — ABNORMAL HIGH (ref 70–99)
Glucose-Capillary: 138 mg/dL — ABNORMAL HIGH (ref 70–99)
Glucose-Capillary: 150 mg/dL — ABNORMAL HIGH (ref 70–99)

## 2019-05-26 LAB — MAGNESIUM: Magnesium: 2.5 mg/dL — ABNORMAL HIGH (ref 1.7–2.4)

## 2019-05-26 MED ORDER — PREDNISONE 20 MG PO TABS
20.0000 mg | ORAL_TABLET | Freq: Every day | ORAL | Status: DC
  Administered 2019-05-27 – 2019-05-28 (×2): 20 mg via ORAL
  Filled 2019-05-26 (×2): qty 1

## 2019-05-26 MED ORDER — FUROSEMIDE 10 MG/ML IJ SOLN
60.0000 mg | Freq: Two times a day (BID) | INTRAMUSCULAR | Status: DC
  Administered 2019-05-27 – 2019-05-28 (×3): 60 mg via INTRAVENOUS
  Filled 2019-05-26 (×3): qty 6

## 2019-05-26 MED ORDER — OXYCODONE HCL 5 MG PO TABS
2.5000 mg | ORAL_TABLET | Freq: Four times a day (QID) | ORAL | Status: DC | PRN
  Administered 2019-05-27: 2.5 mg via ORAL
  Administered 2019-05-28: 5 mg via ORAL
  Filled 2019-05-26 (×2): qty 1

## 2019-05-26 MED ORDER — MORPHINE SULFATE (PF) 4 MG/ML IV SOLN: 4.0000 mg | Freq: Once | INTRAVENOUS | Status: DC

## 2019-05-26 NOTE — Progress Notes (Signed)
Primary RN informed this Psychiatric nurse on this shift patient had bright red bleeding from rectum with clots. This RN assessed patient. VSS. Dr Shanon Brow notified and ordered STAT H&H and type and screen. Verbal order received to hold lovenox and asprin. . This RN informed primary RN of orders. Son updated per patient's request. Rapid response also notified. Patient is in no acute distress. Will continue to monitor.

## 2019-05-26 NOTE — Progress Notes (Signed)
Patient alert and oriented x4 and paged the nurses station for an gingerale.

## 2019-05-26 NOTE — Progress Notes (Signed)
Upon repositioning patient in bleed and changing her purewick device to a new one, I noticed that the patient had a moderate amount of bright red blood with blood clot formation draining from her rectum. I assessed the patient and did not note any hemorrhoids; I also asked the patient did she ever had hemorrhoids and she stated no. Therefore, I called the charge nurse Danae Chen to the patients room for further assessment. She assessed the patient and noted more drainage of blood coming from the patients rectum and than called the on call physician. Labs were ordered and I am currently waiting for the results and am still monitoring patient. Her vital signs are stable. Also, the day shift nurse Nira Conn did tell me that the patient had a bowel movement with a small amount of blood earlier during the day but no noted bleeding other than that. The nurse stated that the doctor was also aware of the patients status.

## 2019-05-26 NOTE — Progress Notes (Signed)
OT Cancellation Note  Patient Details Name: SHANYLAH BROSHEARS MRN: [id number] DOB: [birth year]   Cancelled Treatment:    Reason Eval/Treat Not Completed: Medical issues which prohibited therapy(NRB; HFNC; hypotensive; blood in rectum)  Yakima Kreitzer,HILLARY 05/26/2019, 7:49 PM

## 2019-05-26 NOTE — Progress Notes (Signed)
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 She tested positive for COVID-19 November 6. Patient had a dry cough, she was quarantined at a skilled nursing facility. On the day of hospitalization she was noted to be hypoxic, requiring supplemental oxygen per nasal cannula. On her initial physical examination her oximetry was 76% on room air, blood pressure was 101/73, pulse rate 76, respirate 15, oxygen saturation 95% on 6 L of supplemental oxygen per nasal cannula.She had coarse breath sounds bilaterally, heart S1-S2 present rhythmic, abdomen soft, no lower extremity edema. Her chest radiograph had bilateral interstitial infiltrates, right upper lobe and left upper lobe.  Patient was admitted to the hospital working diagnosis of acute hypoxic respiratory failure due to SARS COVID-19 viral pneumonia.  Significant Events: 11/6 Covid positive 11/21 admit via [facility] -transferred to the [facility] 12/14 Pulmonary consult   COVID-19 specific Treatment: Actemra 11/23 Remdesivir 11/21 > 11/25 Decadron 11/21 > 12/2 Solu-Medrol 12/12 > 12/15 Prednisone 12/16 >  Subjective: The patient is very sedated at the time of my visit.  [name] requiring high flow nasal cannula oxygen as well as nonrebreather support.  When sleeping her saturations were dipping into the upper 70s and mid 80s.  She does not appear uncomfortable.  She does not awaken to my exam.  Nurses have noted some BRBPR as indicated.  Assessment & Plan:  COVID Pneumonia -acute hypoxic respiratory failure Patient has been dosed with Actemra, remdesivir, and steroid and has completed courses of these 3 meds - she was also empirically treated with a course of cefepime -high oxygen requirement persists -CT  chest 12/12 revealed persisting groundglass opacities bilaterally -Pulmonary has seen in consultation -steroid course has been resumed with tapering of oral prednisone ongoing at this time-venous duplex bilateral lower extremities without evidence of DVT and D-dimer has normalized -continue current management - further recovery, if occurs, will be over a protracted course -today she appears to be losing ground -may need to consider transition to comfort focused care if we do not see her improve over the next 12- 24 hours  Acute diastolic CHF exacerbation TTE confirmed grade 1 diastolic dysfunction with preserved systolic function -continue attempts to diurese -patient is net negative approximately 15.3 L since admission  Hyponatremia Sodium has normalized with ongoing diuresis  Persistent hypokalemia Due to ongoing high-dose diuretic -corrected with supplementation  Acute metabolic encephalopathy Sedate again today -difficult to determine if this is simply sleep apnea or representative of worsening mental state -continue to monitor  Mild/Moderate BRBPR Sounds most c/w minor irritation related to bowel movements and enemas - recheck CBC to assure no signif acute anemia   Depression Continue Zoloft and trazodone  HTN Blood pressure controlled at this time  DM2 A1c 8.4 -CBG controlled today w/o hypoglycemia   Hyperlipidemia Continue atorvastatin  Macrocytic anemia Following hemoglobin in serial fashion  Constipation Nursing notes confirm bowel movements - giving trial of proctofoam for rectal irritation   Obesity - Estimated body mass index is 34.16 kg/m as calculated from the following:   Height as of this encounter: 5\' 5"  (1.651 m).   Weight as of this encounter: 93.1 kg.    DVT prophylaxis: Lovenox Code Status: NO CODE - DNR Family Communication:  Disposition Plan: med surg bed - investigating  possibility of LTACH placement   Consultants:  Pulmonary  Antimicrobials:    Cefepime 12/1 >12/8  Objective: Blood pressure (!) 98/51, pulse (!) 130, temperature 99.2 F (37.3 C), temperature source Axillary, resp. rate (!) 22, height 5\' 5"  (1.651 m), weight 93.1 kg, SpO2 (!) 79 %.  Intake/Output Summary (Last 24 hours) at 05/26/2019 1626 Last data filed at 05/26/2019 1200 Gross per 24 hour  Intake 430 ml  Output 950 ml  Net -520 ml   Filed Weights   05/22/19 0411 05/23/19 0420 05/25/19 0416  Weight: 94.7 kg 96 kg 93.1 kg    Examination: General: No acute respiratory distress -sedate Lungs: Distant breath sounds in all fields -respirations appear comfortable Cardiovascular: RRR without appreciable murmur or rub Extremities: 1+ B LE edema without change Abdom: obese, soft, bs+, no rebound, non-distended   CBC: Recent Labs  Lab 05/21/19 0500 05/25/19 2244  WBC 14.2*  --   HGB 11.4* 11.4*  HCT 36.5 37.1  MCV 99.7  --   PLT 202  --    Basic Metabolic Panel: Recent Labs  Lab 05/23/19 0200 05/24/19 0500 05/26/19 0146  NA 136 143 138  K 3.9 3.3* 4.6  CL 85* 101 89*  CO2 40* 33* 37*  GLUCOSE 142* 95 150*  BUN 29* 23 35*  CREATININE 0.71 0.58 0.89  CALCIUM 9.3 8.0* 9.6  MG 2.2 1.8 2.5*   GFR: Estimated Creatinine Clearance: 59.7 mL/min (by C-G formula based on SCr of 0.89 mg/dL).  Liver Function Tests: Recent Labs  Lab 05/21/19 0500  AST 24  ALT 17  ALKPHOS 78  BILITOT 0.3  PROT 6.3*  ALBUMIN 3.0*     HbA1C: Hgb A1c MFr Bld  Date/Time Value Ref Range Status  05/14/2019 05:00 AM 8.4 (H) 4.8 - 5.6 % Final    Comment:    (NOTE) Pre diabetes:          5.7%-6.4% Diabetes:              >6.4% Glycemic control for   <7.0% adults with diabetes   04/09/2018 03:26 PM 6.3 4.6 - 6.5 % Final    Comment:    Glycemic Control Guidelines for People with Diabetes:Non Diabetic:  <6%Goal of Therapy: <7%Additional Action Suggested:  >8%     CBG: Recent Labs  Lab 05/25/19 1117 05/25/19 1547 05/26/19 0748 05/26/19 1125 05/26/19 1553   GLUCAP 118* 192* 143* 138* 150*    Scheduled Meds: . aspirin EC  325 mg Oral Daily  . atorvastatin  40 mg Oral q1800  . Chlorhexidine Gluconate Cloth  6 each Topical Daily  . cholecalciferol  1,000 Units Oral Daily  . enoxaparin (LOVENOX) injection  0.5 mg/kg Subcutaneous Q24H  . furosemide  60 mg Intravenous Q8H  . gabapentin  600 mg Oral TID WC & HS  . insulin aspart  0-20 Units Subcutaneous TID WC  . insulin aspart  0-5 Units Subcutaneous QHS  . insulin detemir  10 Units Subcutaneous Daily  . Ipratropium-Albuterol  4 puff Inhalation TID  . lactulose  10 g Oral BID  . magic mouthwash w/lidocaine  10 mL Oral TID  . mouth rinse  15 mL Mouth Rinse BID  . metoprolol succinate  25 mg Oral Daily  . mometasone-formoterol  2 puff Inhalation BID  .  morphine injection  4 mg Intravenous Once  . multivitamin with minerals  1 tablet Oral Daily  . pantoprazole  40 mg Oral Daily  . polyethylene glycol  17 g  Oral TID  . predniSONE  40 mg Oral Q breakfast  . pyridOXINE  50 mg Oral Daily  . senna-docusate  2 tablet Oral BID  . sertraline  50 mg Oral Daily  . traZODone  50 mg Oral QHS  . vitamin C  500 mg Oral Daily    LOS: 31 days   Cherene Altes, MD [facility] Office  [phone] Pager - Text Page per Amion  If 7PM-7AM, please contact night-coverage per Amion 05/26/2019, 4:26 PM

## 2019-05-26 NOTE — Progress Notes (Addendum)
0745: Assumed care of Pt from night RN. Pt alert, C/o pain, nausea, anxiety, and bleeding from her rectum. Pt turned, no bleeding noted. HR ST 130s, SpO2 87% on 15L HFNC and NRB. Does not want to get to chair at this time. Bed alarm on, call bell within reach  0945: No bleeding from rectum noticed this AM. Per MD, will hold lovenox and aspirin for one more dose. At bedside attempting to educate Pt on importance of getting OOB and turning among other self care practices. Pt interrupted with "please stop talking." Bed alarm on and call bell within reach.   1145: Called son to give updates with no answer. Left message.   1500: Pt more lethargic and not maintaining sats . Blood clots present from rectum as well. MD at bedside to assess. SpO2 78-80% on 15L HFNC and NRB. HR 130s, last BP 111/60.   1715: Lengthy update with son Butch. Very appreciative and understanding, all questions answered.  1810: Bed pad with minimal blood from rectum. Pt remains lethargic, slightly hypotensive, SpO2 92% on 15 L HFNC and NRB. Repositioned, call bell within reach

## 2019-05-26 NOTE — Progress Notes (Signed)
Patient son Theadore Nan called to update on mom's status, no answer voicemail left to call back.

## 2019-05-26 NOTE — Progress Notes (Signed)
PT Cancellation Note  Patient Details Name: [name] MRN: [id number] DOB: 03/30/1941   Cancelled Treatment:    Reason Eval/Treat Not Completed: Medical issues which prohibited therapy, having blood per rectom, on HFNC and NRB. Will check back another day.   Claretha Cooper 05/26/2019, 4:44 PM

## 2019-05-26 NOTE — Progress Notes (Signed)
Patient is very lethargic and only responds to her name being called. She will not open her eyes or follow commands. She has accessory muscle use, breathing from her abdomen. Lungs are diminished. Oxygen saturation 92% on 15L HFNC and NRB. Report from [facility] stated that the patient blood pressure has been soft starting around 1700 today. Blood pressure currently in the 130's.

## 2019-05-26 NOTE — Progress Notes (Signed)
Patient is still having some oozing of blood from her rectum, size: small with no blood clots. Patient is resting in bed.

## 2019-05-26 NOTE — Plan of Care (Signed)
Patient is on 15L NRB and HFNC, she is sinus rhythm on the monitor. See progress notes.

## 2019-05-26 NOTE — TOC Progression Note (Signed)
Transition of Care [facility]) - Progression Note    Patient Details  Name: ALYLAH TRAMONTANA MRN: [id number] Date of Birth: [birth year]  Transition of [facility]) CM/SW Contact  Joaquin Courts, RN Phone Number: 05/26/2019, 2:01 PM  Clinical Narrative:    CM received call from [facility] rep who states at this time patient is not stable to consider for admission, states patient will need to have a stable respiratory status or show improvement in respiratory status.  CM reached out to rep for [facility] who reviewed chart and stated that patient meets their criteria for admission, but will need insurance authorization.  CM called patient's son/poa twice and left voicemail to discuss discharge planning, no return phone call received. CM will continue to follow and attempt to reach son to discuss LTACH placement. If son will be in agreement to  [facility], [facility] rep states she can initiate authorization process.     Expected Discharge Plan: [place] Barriers to Discharge: Continued Medical Work up  Expected Discharge Plan and Services Expected Discharge Plan: [place] arrangements for the past 2 months: [place]                 DME Arranged: N/A         HH Arranged: NA           Social Determinants of Health (SDOH) Interventions    Readmission Risk Interventions No flowsheet data found.

## 2019-05-26 NOTE — Progress Notes (Signed)
Patient repositioned in bed, upon assessment there was a small amount of  dark red blood with blood clots that were coming out her rectum and some was on the bed pad underneath patient.

## 2019-05-27 DIAGNOSIS — E669 Obesity, unspecified: Secondary | ICD-10-CM

## 2019-05-27 DIAGNOSIS — K625 Hemorrhage of anus and rectum: Secondary | ICD-10-CM

## 2019-05-27 LAB — CBC WITH DIFFERENTIAL/PLATELET
Abs Immature Granulocytes: 0.25 10*3/uL — ABNORMAL HIGH (ref 0.00–0.07)
Basophils Absolute: 0 10*3/uL (ref 0.0–0.1)
Basophils Relative: 0 %
Eosinophils Absolute: 0.1 10*3/uL (ref 0.0–0.5)
Eosinophils Relative: 1 %
HCT: 34.7 % — ABNORMAL LOW (ref 36.0–46.0)
Hemoglobin: 10.3 g/dL — ABNORMAL LOW (ref 12.0–15.0)
Immature Granulocytes: 2 %
Lymphocytes Relative: 12 %
Lymphs Abs: 2 10*3/uL (ref 0.7–4.0)
MCH: 31.2 pg (ref 26.0–34.0)
MCHC: 29.7 g/dL — ABNORMAL LOW (ref 30.0–36.0)
MCV: 105.2 fL — ABNORMAL HIGH (ref 80.0–100.0)
Monocytes Absolute: 1.6 10*3/uL — ABNORMAL HIGH (ref 0.1–1.0)
Monocytes Relative: 10 %
Neutro Abs: 12.8 10*3/uL — ABNORMAL HIGH (ref 1.7–7.7)
Neutrophils Relative %: 75 %
Platelets: 546 10*3/uL — ABNORMAL HIGH (ref 150–400)
RBC: 3.3 MIL/uL — ABNORMAL LOW (ref 3.87–5.11)
RDW: 15.2 % (ref 11.5–15.5)
WBC: 16.9 10*3/uL — ABNORMAL HIGH (ref 4.0–10.5)
nRBC: 0.4 % — ABNORMAL HIGH (ref 0.0–0.2)

## 2019-05-27 LAB — COMPREHENSIVE METABOLIC PANEL
ALT: 17 U/L (ref 0–44)
AST: 35 U/L (ref 15–41)
Albumin: 2.9 g/dL — ABNORMAL LOW (ref 3.5–5.0)
Alkaline Phosphatase: 86 U/L (ref 38–126)
Anion gap: 11 (ref 5–15)
BUN: 47 mg/dL — ABNORMAL HIGH (ref 8–23)
CO2: 37 mmol/L — ABNORMAL HIGH (ref 22–32)
Calcium: 9.6 mg/dL (ref 8.9–10.3)
Chloride: 92 mmol/L — ABNORMAL LOW (ref 98–111)
Creatinine, Ser: 1.13 mg/dL — ABNORMAL HIGH (ref 0.44–1.00)
GFR calc Af Amer: 54 mL/min — ABNORMAL LOW (ref >60)
GFR calc non Af Amer: 47 mL/min — ABNORMAL LOW (ref >60)
Glucose, Bld: 127 mg/dL — ABNORMAL HIGH (ref 70–99)
Potassium: 4.4 mmol/L (ref 3.5–5.1)
Sodium: 140 mmol/L (ref 135–145)
Total Bilirubin: 0.6 mg/dL (ref 0.3–1.2)
Total Protein: 6.5 g/dL (ref 6.5–8.1)

## 2019-05-27 LAB — GLUCOSE, CAPILLARY
Glucose-Capillary: 105 mg/dL — ABNORMAL HIGH (ref 70–99)
Glucose-Capillary: 150 mg/dL — ABNORMAL HIGH (ref 70–99)
Glucose-Capillary: 128 mg/dL — ABNORMAL HIGH (ref 70–99)
Glucose-Capillary: 109 mg/dL — ABNORMAL HIGH (ref 70–99)

## 2019-05-27 MED ORDER — ENOXAPARIN SODIUM 60 MG/0.6ML ~~LOC~~ SOLN
0.5000 mg/kg | SUBCUTANEOUS | Status: DC
  Administered 2019-05-27: 45 mg via SUBCUTANEOUS
  Filled 2019-05-27: qty 0.6

## 2019-05-27 MED ORDER — MORPHINE SULFATE (PF) 2 MG/ML IV SOLN
2.0000 mg | Freq: Once | INTRAVENOUS | Status: DC
  Filled 2019-05-27: qty 1

## 2019-05-27 NOTE — Plan of Care (Signed)
Patient is on 15L HFNC/NRB and takes her oxygen off at times and drops into the 50's. At rest her oxygen is between 85-94%. she was lethargic during the beginning of the shift now she is a/o x4 as of 2300 on 05/26/19. She has had one episode of blood coming from her rectum. Her sacrum is pink but blanchable; barrier cream applied. S

## 2019-05-27 NOTE — Plan of Care (Signed)
Went over [place] with pt. All questions asked and all questions answered.

## 2019-05-27 NOTE — Progress Notes (Signed)
PROGRESS NOTE  [name] [id number] DOB: [birth year] DOA: 04/06/2019 PCP: Virgel Bouquet, MD  HPI/Recap of past 86 hours: 78 year old female with past medical history of depression, anxiety, hypertension I & D obesity who was residing at a skilled nursing facility and tested positive for COVID-19 on 11/6.  Initially quarantined and only had dry cough, but then continued to decline and presented to the emergency room on 11/20 hypoxic secondary to Covid pneumonia.  Following admission, patient was treated with Actemra, Remdisivir and steroids.  Despite this, she has continued to decline, with oxygen needs increasing.  Today she is on 15 L high flow nasal cannula and 15 L nonrebreather on the splitter.  Despite this, oxygen saturations are in the high 70s to low 80s.  Patient is awake and I have spoken with her about possibility of making her comfort care.  She said she did not want to be made comfortable and that she wanted to continue to try to fight this.  She still does not want to be put on a ventilator.  Assessment/Plan: Principal Problem: Acute respiratory failure secondary to multifocal pneumonia from COVID-19: Despite aggressive attempts with remdesivir, Actemra, steroids and supplemental oxygen, patient continues to worsen.  She may not live through the next 24-48 hours.  I have updated her son who understands.  I discussed this with the patient also understands,.  [name] not ready to be made comfort care however this may not change her outcome at all.  She did reaffirm she does not want to be on a ventilator.  We will try a single dose of IV morphine to help her work of breathing. Active Problems:   Depression: On home medications    Hyperlipidemia   Essential hypertension    Obesity (BMI 30-39.9): Patient is criteria BMI greater than 30.   Rectal bleeding: Started approximately a week ago.  It is possible this could be diverticular.  Unfortunately, at this time, patient is not  stable enough to get a colonoscopy.  Hemoglobin relatively stable.  Will follow closely.   Code Status: Full code  Family Communication: Updated son by phone  Disposition Plan: She may pass in the next few days in the hospital   Consultants:  Pulmonary  Procedures:  None  Antimicrobials:  IV cefepime 12/1-12/8  Actemra 11/23  Remdesivir 11/21 > 11/25  DVT prophylaxis: We will change to SCDs given rectal bleeding   Objective: Vitals:   05/27/19 1000 05/27/19 1015  BP: (!) 104/35   Pulse: (!) 130 (!) 129  Resp:    Temp:    SpO2: (!) 77% (!) 83%    Intake/Output Summary (Last 24 hours) at 05/27/2019 1227 Last data filed at 05/27/2019 0700 Gross per 24 hour  Intake 487 ml  Output 1000 ml  Net -513 ml   Filed Weights   05/22/19 0411 05/23/19 0420 05/25/19 0416  Weight: 94.7 kg 96 kg 93.1 kg   Body mass index is 34.16 kg/m.  Exam:   General: Fatigued, mild distress secondary to respiratory, alert and oriented x2  H EENT: Normocephalic, on facemask and nasal cannula, mucous membranes are slightly dry  Neck: Thick, narrow airway  Cardiovascular: Regular rhythm, tachycardic  Respiratory: Coarse breath sounds bilaterally  Abdomen: Soft, nontender, nondistended, positive bowel sounds  Musculoskeletal: No clubbing or cyanosis, trace pitting edema  Skin: No skin breaks, tears or lesions  Neuro: No focal deficits  Psychiatry: Appropriate, no evidence of psychoses   Data Reviewed: CBC: Recent Labs  Lab 05/21/19 0500 05/25/19 2244 05/26/19 1530 05/27/19 0412  WBC 14.2*  --  16.5* 16.9*  NEUTROABS  --   --   --  12.8*  HGB 11.4* 11.4* 11.2* 10.3*  HCT 36.5 37.1 37.4 34.7*  MCV 99.7  --  105.9* 105.2*  PLT 202  --  599* 0000000*   Basic Metabolic Panel: Recent Labs  Lab 05/22/19 0500 05/23/19 0200 05/24/19 0500 05/26/19 0146 05/27/19 0412  NA 135 136 143 138 140  K 3.5 3.9 3.3* 4.6 4.4  CL 85* 85* 101 89* 92*  CO2 37* 40* 33* 37* 37*   GLUCOSE 139* 142* 95 150* 127*  BUN 30* 29* 23 35* 47*  CREATININE 0.63 0.71 0.58 0.89 1.13*  CALCIUM 9.3 9.3 8.0* 9.6 9.6  MG  --  2.2 1.8 2.5*  --    GFR: Estimated Creatinine Clearance: 47 mL/min (A) (by C-G formula based on SCr of 1.13 mg/dL (H)). Liver Function Tests: Recent Labs  Lab 05/21/19 0500 05/27/19 0412  AST 24 35  ALT 17 17  ALKPHOS 78 86  BILITOT 0.3 0.6  PROT 6.3* 6.5  ALBUMIN 3.0* 2.9*   No results for input(s): LIPASE, AMYLASE in the last 168 hours. No results for input(s): AMMONIA in the last 168 hours. Coagulation Profile: No results for input(s): INR, PROTIME in the last 168 hours. Cardiac Enzymes: No results for input(s): CKTOTAL, CKMB, CKMBINDEX, TROPONINI in the last 168 hours. BNP (last 3 results) No results for input(s): PROBNP in the last 8760 hours. HbA1C: No results for input(s): HGBA1C in the last 72 hours. CBG: Recent Labs  Lab 05/25/19 1547 05/26/19 0748 05/26/19 1125 05/26/19 1553 05/26/19 2139  GLUCAP 192* 143* 138* 150* 105*   Lipid Profile: No results for input(s): CHOL, HDL, LDLCALC, TRIG, CHOLHDL, LDLDIRECT in the last 72 hours. Thyroid Function Tests: No results for input(s): TSH, T4TOTAL, FREET4, T3FREE, THYROIDAB in the last 72 hours. Anemia Panel: No results for input(s): VITAMINB12, FOLATE, FERRITIN, TIBC, IRON, RETICCTPCT in the last 72 hours. Urine analysis:    Component Value Date/Time   COLORURINE STRAW (A) 08/09/2018 2049   APPEARANCEUR CLEAR 08/09/2018 2049   LABSPEC 1.009 08/09/2018 2049   PHURINE 6.0 08/09/2018 2049   GLUCOSEU NEGATIVE 08/09/2018 2049   HGBUR NEGATIVE 08/09/2018 2049   BILIRUBINUR NEGATIVE 08/09/2018 2049   BILIRUBINUR neg 07/20/2016 [place] 08/09/2018 2049   PROTEINUR NEGATIVE 08/09/2018 2049   UROBILINOGEN negative 07/20/2016 1219   UROBILINOGEN 0.2 09/18/2014 1257   NITRITE NEGATIVE 08/09/2018 2049   LEUKOCYTESUR TRACE (A) 08/09/2018 2049   Sepsis Labs:  @[email or web address] (procalcitonin:4,lacticidven:4)  )No results found for this or any previous visit (from the past 240 hour(s)).    Studies: No results found.  Scheduled Meds: . aspirin EC  325 mg Oral Daily  . atorvastatin  40 mg Oral q1800  . Chlorhexidine Gluconate Cloth  6 each Topical Daily  . cholecalciferol  1,000 Units Oral Daily  . enoxaparin (LOVENOX) injection  0.5 mg/kg Subcutaneous Q24H  . furosemide  60 mg Intravenous Q12H  . insulin aspart  0-20 Units Subcutaneous TID WC  . insulin aspart  0-5 Units Subcutaneous QHS  . insulin detemir  10 Units Subcutaneous Daily  . Ipratropium-Albuterol  4 puff Inhalation TID  . lactulose  10 g Oral BID  . magic mouthwash w/lidocaine  10 mL Oral TID  . mouth rinse  15 mL Mouth Rinse BID  . metoprolol succinate  25 mg Oral Daily  .  mometasone-formoterol  2 puff Inhalation BID  .  morphine injection  2 mg Intravenous Once  . multivitamin with minerals  1 tablet Oral Daily  . pantoprazole  40 mg Oral Daily  . polyethylene glycol  17 g Oral TID  . predniSONE  20 mg Oral Q breakfast  . pyridOXINE  50 mg Oral Daily  . senna-docusate  2 tablet Oral BID  . sertraline  50 mg Oral Daily  . traZODone  50 mg Oral QHS  . vitamin C  500 mg Oral Daily    Continuous Infusions:   LOS: 32 days     Annita Brod, MD [facility]  To reach me or the doctor on call, go to: [email or web address] Password [facility]  05/27/2019, 12:27 PM

## 2019-05-27 NOTE — Progress Notes (Addendum)
Patient oxygen saturation keeps dropping down in to the high 70's. I administered 2 puffs of albuterol PRN roughly 20 minutes ago and patient oxygen saturation increased to 83%. Her respirations are currently 15 and she is abdominal breathing. Mrs. Barndt is still on 15L NRB and 15L HFNC.She did refuse her 2000 respiratory treatments along with her other scheduled medications and is currently in bed asleep.

## 2019-05-27 NOTE — Progress Notes (Signed)
Patient refused all of her medications and went back to sleep.

## 2019-05-27 NOTE — Progress Notes (Signed)
   05/27/19 0800  Family/Significant Other Communication  Family/Significant Other Update Called;Updated   Son called and updated. All questions asked and all questions answered.

## 2019-05-28 LAB — CBC
HCT: 35 % — ABNORMAL LOW (ref 36.0–46.0)
Hemoglobin: 10.3 g/dL — ABNORMAL LOW (ref 12.0–15.0)
MCH: 31.1 pg (ref 26.0–34.0)
MCHC: 29.4 g/dL — ABNORMAL LOW (ref 30.0–36.0)
MCV: 105.7 fL — ABNORMAL HIGH (ref 80.0–100.0)
Platelets: 632 10*3/uL — ABNORMAL HIGH (ref 150–400)
RBC: 3.31 MIL/uL — ABNORMAL LOW (ref 3.87–5.11)
RDW: 15.1 % (ref 11.5–15.5)
WBC: 19.6 10*3/uL — ABNORMAL HIGH (ref 4.0–10.5)
nRBC: 1.1 % — ABNORMAL HIGH (ref 0.0–0.2)

## 2019-05-28 LAB — POCT I-STAT 7, (LYTES, BLD GAS, ICA,H+H)
Acid-Base Excess: 15 mmol/L — ABNORMAL HIGH (ref 0.0–2.0)
Bicarbonate: 42.8 mmol/L — ABNORMAL HIGH (ref 20.0–28.0)
Calcium, Ion: 1.28 mmol/L (ref 1.15–1.40)
HCT: 33 % — ABNORMAL LOW (ref 36.0–46.0)
Hemoglobin: 11.2 g/dL — ABNORMAL LOW (ref 12.0–15.0)
O2 Saturation: 74 %
Patient temperature: 97.6
Potassium: 4.5 mmol/L (ref 3.5–5.1)
Sodium: 136 mmol/L (ref 135–145)
TCO2: 45 mmol/L — ABNORMAL HIGH (ref 22–32)
pCO2 arterial: 70.1 mmHg (ref 32.0–48.0)
pH, Arterial: 7.392 (ref 7.350–7.450)
pO2, Arterial: 40 mmHg — CL (ref 83.0–108.0)

## 2019-05-28 LAB — BASIC METABOLIC PANEL
Anion gap: 12 (ref 5–15)
BUN: 56 mg/dL — ABNORMAL HIGH (ref 8–23)
CO2: 38 mmol/L — ABNORMAL HIGH (ref 22–32)
Calcium: 9.6 mg/dL (ref 8.9–10.3)
Chloride: 90 mmol/L — ABNORMAL LOW (ref 98–111)
Creatinine, Ser: 1.19 mg/dL — ABNORMAL HIGH (ref 0.44–1.00)
GFR calc Af Amer: 51 mL/min — ABNORMAL LOW (ref >60)
GFR calc non Af Amer: 44 mL/min — ABNORMAL LOW (ref >60)
Glucose, Bld: 116 mg/dL — ABNORMAL HIGH (ref 70–99)
Potassium: 4.6 mmol/L (ref 3.5–5.1)
Sodium: 140 mmol/L (ref 135–145)

## 2019-05-28 LAB — GLUCOSE, CAPILLARY: Glucose-Capillary: 130 mg/dL — ABNORMAL HIGH (ref 70–99)

## 2019-05-28 MED ORDER — MORPHINE SULFATE (PF) 2 MG/ML IV SOLN
2.0000 mg | INTRAVENOUS | Status: DC | PRN
  Administered 2019-05-28 (×2): 2 mg via INTRAVENOUS
  Filled 2019-05-28 (×2): qty 1

## 2019-05-28 MED ORDER — OXYCODONE HCL 5 MG PO TABS
5.0000 mg | ORAL_TABLET | Freq: Once | ORAL | Status: AC
  Administered 2019-05-28: 5 mg via ORAL
  Filled 2019-05-28: qty 1

## 2019-05-28 MED ORDER — OXYCODONE HCL 5 MG PO TABS
2.5000 mg | ORAL_TABLET | ORAL | Status: DC | PRN
  Administered 2019-05-28: 5 mg via ORAL
  Filled 2019-05-28: qty 1

## 2019-05-28 MED ORDER — MORPHINE SULFATE (PF) 2 MG/ML IV SOLN
1.0000 mg | INTRAVENOUS | Status: DC | PRN
  Administered 2019-05-28 (×2): 1 mg via INTRAVENOUS
  Filled 2019-05-28: qty 1

## 2019-05-28 MED ORDER — LORAZEPAM 2 MG/ML IJ SOLN
0.5000 mg | INTRAMUSCULAR | Status: DC | PRN
  Administered 2019-05-28: 0.5 mg via INTRAVENOUS
  Filled 2019-05-28: qty 1

## 2019-06-05 NOTE — Progress Notes (Signed)
Writer is Agricultural consultant on shift. Informed RT about patient's sats on max O2 support. RT recommended heated highflow. Dr Vanita Ingles paged and made aware of RT recommendation. Awaiting new orders.

## 2019-06-05 NOTE — Progress Notes (Signed)
Rn spoke to [place] )son) and [name] (other son), plan for family visit at 35 today. Ms. Kingston condition is declining and she states she is "tired". Her oxygen saturation is at 78-80% on 15 liter NRB and max HFNC. I assisted the Ms. [place] with talking to both her brothers today via telephone. Ms. Cruise is refusing her oral medications and is requesting IV pain medications which were discussed with the provider today and ordered for today.

## 2019-06-05 NOTE — Progress Notes (Signed)
PT Cancellation Note  Patient Details Name: [name] MRN: [id number] DOB: [birth year]   Cancelled Treatment:    Reason Eval/Treat Not Completed: Medical issues which prohibited therapynow on Heated HFNC, declining status.    Claretha Cooper 2019/06/12, 7:24 AM [place] Pager [phone] Office [phone]

## 2019-06-05 NOTE — Progress Notes (Signed)
RN in room while Dr. Vanita Ingles spoke with pt and pt son [name]" about in depth about pt current condition and pt wishes for care moving forward. Both pt and pt son state that they do not wish to pursue intubation at this time. Pt states "it's my time to die". Pt declining comfort care at this time.

## 2019-06-05 NOTE — Progress Notes (Signed)
At [place]. [name]'s son called as she was taking her last breaths. She was comfortable and two staff were at her beside during this process. I was also in direct communication with Dr. [place] Pink during her last 15 minutes of life communicating about her comfort level and need for increased medications for pain. [name] was able to talk to the nurse and RT [facility] in the room about her needs. She was took her last breaths and was pronounced dead at 1511 with myself and Calla Kicks. Dr. [place] Pink was notified. [name] body was prepared and delivered to the morgue as per policy.

## 2019-06-05 NOTE — Death Summary Note (Signed)
Death Summary  [name] [id number] DOB: April 07, 1941 DOA: 2019/05/15  PCP: Flossie Buffy, NP  Admit date: 2019/05/15 Date of Death: 06/18/19 Time of Death: 3:11 PM Notification: [facility] [place] [facility] answering service notified of death of 06-18-2019.  Patient had been seen by physicians and nurse practitioner there.   History of present illness:  79 year old female with past medical history of depression, anxiety, hypertension I & D obesity who was residing at a skilled nursing facility and tested positive for COVID-19 on 11/6.  Initially quarantined and only had dry cough, but then continued to decline and presented to the emergency room on 05-15-23 hypoxic secondary to Covid pneumonia.  Following admission, patient was treated with Actemra, Remdisivir and steroids.   Despite this, she continued to decline, with oxygen needs increasing.    By June 18, 2023, she was on 15 L high flow nasal cannula and 100% nonrebreather on the splitter.  Despite this, oxygen saturations were in the high 70s to low 80s.    Patient told her nurse that she just wants to pass away.  Patient's sons were updated and arrangements were made for them to say goodbye.  She was then continued on oxygen and kept comfortable with medication for pain and agitation.  She passed away at 3:11pm  Final Diagnoses:  1.   Acute respiratory failure secondary to multifocal pneumonia from COVID-19: Despite aggressive attempts with remdesivir, Actemra, steroids and supplemental oxygen, patient's respiratory status continued to decline and she passed away.   Active Problems:   Depression: She was continued on her home medications during her hospitalization    Hyperlipidemia   Essential hypertension    Obesity (BMI 30-39.9): Patient is criteria BMI greater than 30.    Rectal bleeding: Started approximately a week ago.  It is possible this could be diverticular.  Unfortunately, at this time, patient  was not stable enough to get a colonoscopy.    Despite this, her hemoglobin remained stable.   The results of significant diagnostics from this hospitalization (including imaging, microbiology, ancillary and laboratory) are listed below for reference.    Significant Diagnostic Studies: DG Chest 1 View  Result Date: 05/11/2019 CLINICAL DATA:  Dyspnea EXAM: CHEST  1 VIEW COMPARISON:  05/08/2019 FINDINGS: Right PICC line is unchanged. Persistent elevation of the right hemidiaphragm. There is increased opacification the lungs bilaterally. Probable pleural effusions. No pneumothorax. Cardiomediastinal silhouette is obscured. IMPRESSION: Worsening aeration of the lungs.  Probable pleural effusions. Electronically Signed   By: Macy Mis M.D.   On: 05/11/2019 09:11   DG Chest 1 View  Result Date: 05/08/2019 CLINICAL DATA:  COVID-19 pneumonia follow-up EXAM: CHEST  1 VIEW COMPARISON:  05/05/2019 chest radiograph. FINDINGS: Right PICC terminates in middle third of the SVC. Stable cardiomediastinal silhouette with top-normal heart size. No pneumothorax. No definite pleural effusion. Stable prominent elevation of the right hemidiaphragm. Hazy patchy opacities throughout both lungs appears similar. Partially visualized surgical hardware from ACDF. Cholecystectomy clips are seen in the right upper quadrant of the abdomen. IMPRESSION: Stable chest radiograph with patchy hazy opacities throughout both lungs and elevated right hemidiaphragm. Electronically Signed   By: Ilona Sorrel M.D.   On: 05/08/2019 08:13   CT CHEST WO CONTRAST  Result Date: 05/16/2019 CLINICAL DATA:  Shortness of breath.  COVID-19 pneumonia. EXAM: CT CHEST WITHOUT CONTRAST TECHNIQUE: Multidetector CT imaging of the chest was performed following the standard protocol without IV contrast. COMPARISON:  Chest radiograph, 05/14/2019 and older exams. Chest CT,  09/17/2014. FINDINGS: Cardiovascular: Heart is normal in size. No pericardial  effusion. Mild left coronary artery calcifications. Mild aortic atherosclerotic calcifications. Mild prominence of the main pulmonary artery measuring 3.4 cm. Mediastinum/Nodes: No neck base or axillary masses or enlarged lymph nodes. Few prominent mediastinal lymph nodes, largest right paratracheal node just above the carinal measuring 1.2 cm in short axis. This is similar to the prior chest CT. No mediastinal or hilar masses. Trachea and esophagus are unremarkable. Lungs/Pleura: There are extensive bilateral ground-glass airspace opacities. No lung mass nodule. No findings to suggest pulmonary edema. No pleural effusion or pneumothorax. Upper Abdomen: No acute abnormality. Musculoskeletal: No fracture or acute finding. No osteoblastic or osteolytic lesions. IMPRESSION: 1. Bilateral ground-glass lung opacities consistent with multifocal COVID-19 pneumonia. 2. No other acute abnormality. 3. Prominent main pulmonary artery. Possible degree of pulmonary hypertension. 4. Mild left coronary artery calcifications. Mild aortic atherosclerotic calcifications. Aortic Atherosclerosis (ICD10-I70.0). Electronically Signed   By: Lajean Manes M.D.   On: 05/16/2019 15:02   DG CHEST PORT 1 VIEW  Result Date: 05/14/2019 CLINICAL DATA:  Congestive heart failure. EXAM: PORTABLE CHEST 1 VIEW COMPARISON:  May 11, 2019. FINDINGS: Right-sided PICC line is unchanged in position. Stable bilateral lung opacities are noted concerning for edema or pneumonia. No pneumothorax is noted. Cardiomediastinal silhouette is obscured. Bony thorax is unremarkable. IMPRESSION: Stable bilateral lung opacities are noted concerning for edema or pneumonia. Electronically Signed   By: Marijo Conception M.D.   On: 05/14/2019 08:12   DG CHEST PORT 1 VIEW  Result Date: 05/05/2019 CLINICAL DATA:  Hypoxemia, COVID-19 EXAM: PORTABLE CHEST 1 VIEW COMPARISON:  04/29/2019 chest radiograph. FINDINGS: Right PICC terminates in middle third of the SVC. Stable  moderate right hemidiaphragm elevation. Surgical hardware from ACDF in the lower cervical spine. Stable cardiomediastinal silhouette with top-normal heart size. No pneumothorax. No right pleural effusion. Worsening blunting of the left costophrenic angle. Hazy opacities throughout both lungs, most prominent in lower lungs bilaterally, similar. IMPRESSION: No substantial change in hazy diffuse lung opacities, most prominent in the lower lungs, compatible with multilobar pneumonia. Worsening blunting of the left costophrenic angle, equivocal for small left pleural effusion. Electronically Signed   By: Ilona Sorrel M.D.   On: 05/05/2019 10:32   DG CHEST PORT 1 VIEW  Result Date: 04/29/2019 CLINICAL DATA:  Hypoxemia EXAM: PORTABLE CHEST 1 VIEW COMPARISON:  Chest radiograph 04/26/2019 FINDINGS: A right-sided PICC line is now present with tip terminating near the region of the cavoatrial junction. Shallow inspiration radiograph with crowding of the central bronchovascular markings. Cardiomediastinal silhouette unchanged. A left basilar opacity has increased and may reflect atelectasis, pneumonia and/or effusion. Bilateral interstitial and airspace opacities are otherwise similar to prior examination. No evidence of pneumothorax. No acute bony abnormality. Partially visualized ACDF hardware. Overlying cardiac monitoring leads. IMPRESSION: Increasing left basilar opacity which may reflect atelectasis, pneumonia and/or effusion. Bilateral interstitial and airspace opacities are otherwise unchanged and consistent with multifocal pneumonia, possibly with superimposed edema. Electronically Signed   By: Kellie Simmering DO   On: 04/29/2019 15:15   DG Abd Portable 1V  Result Date: 05/24/2019 CLINICAL DATA:  Fecal impaction. EXAM: PORTABLE ABDOMEN - 1 VIEW COMPARISON:  None. FINDINGS: Evaluation for fecal impaction is limited as the pelvis was not included on the study. There is a paucity of bowel gas limiting evaluation but  no evidence of bowel obstruction. No free air, portal venous gas, or pneumatosis. No other acute abnormalities. IMPRESSION: 1. The study is limited as the  pelvis was not included. This limits evaluation for fecal impaction. 2. Opacities in the lung bases are similar since May 14, 2019. 3. No other acute abnormalities are identified. However, there is a paucity of bowel gas. Electronically Signed   By: Dorise Bullion III M.D   On: 05/24/2019 12:46   ECHOCARDIOGRAM COMPLETE  Result Date: 05/12/2019   ECHOCARDIOGRAM REPORT   Patient Name:   RAYANA PHILSON Date of Exam: 05/12/2019 Medical Rec #:  [id number]       Height:       65.0 in Accession #:    [id number]      Weight:       223.5 lb Date of Birth:  [birth year]       BSA:          2.07 m Patient Age:    82 years        BP:           104/60 mmHg Patient Gender: F               HR:           90 bpm. Exam Location:  Inpatient Procedure: 2D Echo, Color Doppler and Cardiac Doppler Indications:    R06.9 DOE  History:        Patient has prior history of Echocardiogram examinations, most                 recent 07/12/2015. Risk Factors:Hypertension and Dyslipidemia.                 Covid+ 04/25/19.  Sonographer:    Raquel Sarna Senior RDCS Referring Phys: [id number] [place]  Sonographer Comments: Technically difficult study due to poor echo windows and patient is morbidly obese. IMPRESSION  1. Small circumferential pericardial effusion.  2. Left ventricular ejection fraction, by visual estimation, is 65 to 70%. The left ventricle has hyperdynamic function. There is mildly increased left ventricular hypertrophy.  3. Left ventricular diastolic parameters are consistent with Grade I diastolic dysfunction (impaired relaxation).  4. The left ventricle has no regional wall motion abnormalities.  5. Global right ventricle has normal systolic function.The right ventricular size is normal. No increase in right ventricular wall thickness.  6. The aortic valve is tricuspid.  Aortic valve regurgitation is not visualized. Mild aortic valve sclerosis without stenosis.  7. Left atrial size was normal.  8. Right atrial size was normal.  9. The tricuspid valve is normal in structure. Tricuspid valve regurgitation is not demonstrated. 10. The mitral valve is normal in structure. No evidence of mitral valve regurgitation. No evidence of mitral stenosis. 11. The inferior vena cava is normal in size with greater than 50% respiratory variability, suggesting right atrial pressure of 3 mmHg. 12. TR signal is inadequate for assessing pulmonary artery systolic pressure. FINDINGS  Left Ventricle: Left ventricular ejection fraction, by visual estimation, is 65 to 70%. The left ventricle has hyperdynamic function. The left ventricle has no regional wall motion abnormalities. The left ventricular internal cavity size was the left ventricle is normal in size. There is mildly increased left ventricular hypertrophy. Left ventricular diastolic parameters are consistent with Grade I diastolic dysfunction (impaired relaxation). Right Ventricle: The right ventricular size is normal. No increase in right ventricular wall thickness. Global RV systolic function is has normal systolic function. Left Atrium: Left atrial size was normal in size. Right Atrium: Right atrial size was normal in size Pericardium: A small pericardial effusion is present. Small circumferential pericardial effusion.  Mitral Valve: The mitral valve is normal in structure. No evidence of mitral valve stenosis by observation. No evidence of mitral valve regurgitation. Tricuspid Valve: The tricuspid valve is normal in structure. Tricuspid valve regurgitation is not demonstrated. Aortic Valve: The aortic valve is tricuspid. Aortic valve regurgitation is not visualized. Mild aortic valve sclerosis is present, with no evidence of aortic valve stenosis. Pulmonic Valve: The pulmonic valve was normal in structure. Pulmonic valve regurgitation is not  visualized. Aorta: The aortic root is normal in size and structure. Venous: The inferior vena cava is normal in size with greater than 50% respiratory variability, suggesting right atrial pressure of 3 mmHg. IAS/Shunts: No atrial level shunt detected by color flow Doppler.  LEFT VENTRICLE PLAX 2D LVIDd:         2.99 cm  Diastology LVIDs:         2.16 cm  LV e' lateral:   4.74 cm/s LV PW:         1.28 cm  LV E/e' lateral: 15.2 LV IVS:        1.39 cm  LV e' medial:    4.35 cm/s LVOT diam:     1.80 cm  LV E/e' medial:  16.6 LV SV:         19 ml LV SV Index:   8.74 LVOT Area:     2.54 cm  RIGHT VENTRICLE RV S prime:     11.00 cm/s TAPSE (M-mode): 1.5 cm LEFT ATRIUM           Index LA diam:      3.60 cm 1.74 cm/m LA Vol (A4C): 34.4 ml 16.59 ml/m  AORTIC VALVE LVOT Vmax:   114.00 cm/s LVOT Vmean:  76.600 cm/s LVOT VTI:    0.194 m  AORTA Ao Root diam: 2.70 cm Ao Asc diam:  2.60 cm MITRAL VALVE MV Area (PHT): 3.54 cm              SHUNTS MV PHT:        62.06 msec            Systemic VTI:  0.19 m MV Decel Time: 214 msec              Systemic Diam: 1.80 cm MV E velocity: 72.20 cm/s  103 cm/s MV A velocity: 125.00 cm/s 70.3 cm/s MV E/A ratio:  0.58        1.5  Loralie Champagne MD Electronically signed by Loralie Champagne MD Signature Date/Time: 05/12/2019/4:11:40 PM    Final    LE Venous  Result Date: 05/19/2019  Lower Venous Study Indications: Swelling.  Risk Factors: COVID 19 positive. Limitations: Body habitus, poor ultrasound/tissue interface and patient positioning. Comparison Study: No prior studies. Performing Technologist: Oliver Hum RVT  Examination Guidelines: A complete evaluation includes B-mode imaging, spectral Doppler, color Doppler, and power Doppler as needed of all accessible portions of each vessel. Bilateral testing is considered an integral part of a complete examination. Limited examinations for reoccurring indications may be performed as noted.   +---------+---------------+---------+-----------+----------+--------------+ RIGHT    CompressibilityPhasicitySpontaneityPropertiesThrombus Aging +---------+---------------+---------+-----------+----------+--------------+ CFV      Full           Yes      Yes                                 +---------+---------------+---------+-----------+----------+--------------+ SFJ      Full                                                        +---------+---------------+---------+-----------+----------+--------------+  FV Prox  Full                                                        +---------+---------------+---------+-----------+----------+--------------+ FV Mid   Full                                                        +---------+---------------+---------+-----------+----------+--------------+ FV Distal               Yes      Yes                                 +---------+---------------+---------+-----------+----------+--------------+ PFV      Full                                                        +---------+---------------+---------+-----------+----------+--------------+ POP      Full           Yes      Yes                                 +---------+---------------+---------+-----------+----------+--------------+ PTV      Full                                                        +---------+---------------+---------+-----------+----------+--------------+ PERO     Full                                                        +---------+---------------+---------+-----------+----------+--------------+   +---------+---------------+---------+-----------+----------+--------------+ LEFT     CompressibilityPhasicitySpontaneityPropertiesThrombus Aging +---------+---------------+---------+-----------+----------+--------------+ CFV      Full           Yes      Yes                                  +---------+---------------+---------+-----------+----------+--------------+ SFJ      Full                                                        +---------+---------------+---------+-----------+----------+--------------+ FV Prox  Full                                                        +---------+---------------+---------+-----------+----------+--------------+  FV Mid   Full                                                        +---------+---------------+---------+-----------+----------+--------------+ FV DistalFull                                                        +---------+---------------+---------+-----------+----------+--------------+ PFV      Full                                                        +---------+---------------+---------+-----------+----------+--------------+ POP      Full           Yes      Yes                                 +---------+---------------+---------+-----------+----------+--------------+ PTV      Full                                                        +---------+---------------+---------+-----------+----------+--------------+ PERO     Full                                                        +---------+---------------+---------+-----------+----------+--------------+     Summary: Right: There is no evidence of deep vein thrombosis in the lower extremity. However, portions of this examination were limited- see technologist comments above. No cystic structure found in the popliteal fossa. Left: There is no evidence of deep vein thrombosis in the lower extremity. However, portions of this examination were limited- see technologist comments above. No cystic structure found in the popliteal fossa.  *See table(s) above for measurements and observations. Electronically signed by Harold Barban MD on 05/19/2019 at 8:29:11 AM.    Final     Microbiology: No results found for this or any previous visit (from the past  240 hour(s)).   Labs: Basic Metabolic Panel: Recent Labs  Lab 05/23/19 0200 05/24/19 0500 05/26/19 0146 05/27/19 0412 06/17/2019 0130 June 17, 2019 0315  NA 136 143 138 140 140 136  K 3.9 3.3* 4.6 4.4 4.6 4.5  CL 85* 101 89* 92* 90*  --   CO2 40* 33* 37* 37* 38*  --   GLUCOSE 142* 95 150* 127* 116*  --   BUN 29* 23 35* 47* 56*  --   CREATININE 0.71 0.58 0.89 1.13* 1.19*  --   CALCIUM 9.3 8.0* 9.6 9.6 9.6  --   MG 2.2 1.8 2.5*  --   --   --    Liver Function Tests: Recent Labs  Lab 05/27/19 [phone]  AST 35  ALT 17  ALKPHOS 86  BILITOT 0.6  PROT 6.5  ALBUMIN 2.9*   No results for input(s): LIPASE, AMYLASE in the last 168 hours. No results for input(s): AMMONIA in the last 168 hours. CBC: Recent Labs  Lab 05/25/19 2244 05/26/19 1530 05/27/19 0412 Jun 16, 2019 0130 06-16-19 0315  WBC  --  16.5* 16.9* 19.6*  --   NEUTROABS  --   --  12.8*  --   --   HGB 11.4* 11.2* 10.3* 10.3* 11.2*  HCT 37.1 37.4 34.7* 35.0* 33.0*  MCV  --  105.9* 105.2* 105.7*  --   PLT  --  599* 546* 632*  --    Cardiac Enzymes: No results for input(s): CKTOTAL, CKMB, CKMBINDEX, TROPONINI in the last 168 hours. D-Dimer No results for input(s): DDIMER in the last 72 hours. BNP: Invalid input(s): POCBNP CBG: Recent Labs  Lab 05/26/19 2139 05/27/19 1209 05/27/19 1638 05/27/19 2047 2019-06-16 0753  GLUCAP 105* 150* 128* 109* 130*   Anemia work up No results for input(s): VITAMINB12, FOLATE, FERRITIN, TIBC, IRON, RETICCTPCT in the last 72 hours. Urinalysis    Component Value Date/Time   COLORURINE STRAW (A) 08/09/2018 2049   APPEARANCEUR CLEAR 08/09/2018 2049   LABSPEC 1.009 08/09/2018 2049   PHURINE 6.0 08/09/2018 2049   GLUCOSEU NEGATIVE 08/09/2018 2049   HGBUR NEGATIVE 08/09/2018 2049   BILIRUBINUR NEGATIVE 08/09/2018 2049   BILIRUBINUR neg 07/20/2016 [place] 08/09/2018 2049   PROTEINUR NEGATIVE 08/09/2018 2049   UROBILINOGEN negative 07/20/2016 1219   UROBILINOGEN 0.2  09/18/2014 1257   NITRITE NEGATIVE 08/09/2018 2049   LEUKOCYTESUR TRACE (A) 08/09/2018 2049   Sepsis Labs Invalid input(s): PROCALCITONIN,  WBC,  LACTICIDVEN     SIGNED:  Annita Brod, MD  [facility] 16-Jun-2019, 3:36 PM Pager   If 7PM-7AM, please contact night-coverage [email or web address] Password [email or web address]

## 2019-06-05 NOTE — Progress Notes (Signed)
Patients son [name]" updated on patient moving to 1C to be put on heated HFNC therapy and that mom is still a/o x4 and there are no changes in patient condition from doctors updating him yesterday.

## 2019-06-05 NOTE — Progress Notes (Signed)
During bed side shift report pt states that she would like more pain medication. Discussed current pain medication regiment, and previous discussion with Dr. Vanita Ingles regarding comfort levels. Pt states "im just dying here". Asked if pt would like to be made more comfortable and she states she does. Message sent to Dr. Laurel Dimmer that pt would like to be made more comfortable. May need another discussion about comfort or palliative care.

## 2019-06-05 NOTE — Progress Notes (Signed)
Pt transferred to 1C due to need of [place]. Pt arrived to room on NRB sat at 40% on arrival. RT in room and pt placed on HHFNC at 40L and 100%. Pt saturation up 82%. RT obtaining ABG now. Pt alert and oriented.

## 2019-06-05 DEATH — deceased
